# Patient Record
Sex: Female | Born: 1935 | Race: White | Hispanic: No | Marital: Single | Smoking: Never smoker
Health system: Southern US, Community
[De-identification: ages and names within clinical notes are randomized; demographics above are authoritative.]

## PROBLEM LIST (undated history)

## (undated) DIAGNOSIS — I4891 Unspecified atrial fibrillation: Secondary | ICD-10-CM

## (undated) DIAGNOSIS — R51 Headache: Secondary | ICD-10-CM

## (undated) DIAGNOSIS — IMO0001 Reserved for inherently not codable concepts without codable children: Secondary | ICD-10-CM

## (undated) DIAGNOSIS — F341 Dysthymic disorder: Secondary | ICD-10-CM

## (undated) DIAGNOSIS — R0789 Other chest pain: Secondary | ICD-10-CM

## (undated) DIAGNOSIS — R0989 Other specified symptoms and signs involving the circulatory and respiratory systems: Secondary | ICD-10-CM

## (undated) DIAGNOSIS — E559 Vitamin D deficiency, unspecified: Secondary | ICD-10-CM

## (undated) DIAGNOSIS — G459 Transient cerebral ischemic attack, unspecified: Secondary | ICD-10-CM

## (undated) DIAGNOSIS — T884XXA Failed or difficult intubation, initial encounter: Secondary | ICD-10-CM

## (undated) DIAGNOSIS — M1990 Unspecified osteoarthritis, unspecified site: Secondary | ICD-10-CM

## (undated) DIAGNOSIS — N6089 Other benign mammary dysplasias of unspecified breast: Secondary | ICD-10-CM

## (undated) DIAGNOSIS — I679 Cerebrovascular disease, unspecified: Secondary | ICD-10-CM

## (undated) DIAGNOSIS — N390 Urinary tract infection, site not specified: Secondary | ICD-10-CM

## (undated) DIAGNOSIS — K5730 Diverticulosis of large intestine without perforation or abscess without bleeding: Secondary | ICD-10-CM

## (undated) DIAGNOSIS — I6529 Occlusion and stenosis of unspecified carotid artery: Secondary | ICD-10-CM

## (undated) DIAGNOSIS — G4700 Insomnia, unspecified: Secondary | ICD-10-CM

## (undated) DIAGNOSIS — E785 Hyperlipidemia, unspecified: Secondary | ICD-10-CM

## (undated) DIAGNOSIS — E039 Hypothyroidism, unspecified: Secondary | ICD-10-CM

## (undated) DIAGNOSIS — F0390 Unspecified dementia without behavioral disturbance: Secondary | ICD-10-CM

## (undated) DIAGNOSIS — M797 Fibromyalgia: Secondary | ICD-10-CM

## (undated) DIAGNOSIS — F05 Delirium due to known physiological condition: Secondary | ICD-10-CM

## (undated) DIAGNOSIS — D126 Benign neoplasm of colon, unspecified: Secondary | ICD-10-CM

## (undated) DIAGNOSIS — I10 Essential (primary) hypertension: Secondary | ICD-10-CM

## (undated) DIAGNOSIS — E119 Type 2 diabetes mellitus without complications: Secondary | ICD-10-CM

## (undated) DIAGNOSIS — H9190 Unspecified hearing loss, unspecified ear: Secondary | ICD-10-CM

## (undated) DIAGNOSIS — N3010 Interstitial cystitis (chronic) without hematuria: Secondary | ICD-10-CM

## (undated) HISTORY — DX: Unspecified osteoarthritis, unspecified site: M19.90

## (undated) HISTORY — DX: Fibromyalgia: M79.7

## (undated) HISTORY — DX: Cerebrovascular disease, unspecified: I67.9

## (undated) HISTORY — DX: Hyperlipidemia, unspecified: E78.5

## (undated) HISTORY — DX: Benign neoplasm of colon, unspecified: D12.6

## (undated) HISTORY — DX: Other chest pain: R07.89

## (undated) HISTORY — DX: Interstitial cystitis (chronic) without hematuria: N30.10

## (undated) HISTORY — DX: Headache: R51

## (undated) HISTORY — DX: Essential (primary) hypertension: I10

## (undated) HISTORY — DX: Other benign mammary dysplasias of unspecified breast: N60.89

## (undated) HISTORY — DX: Failed or difficult intubation, initial encounter: T88.4XXA

## (undated) HISTORY — DX: Vitamin D deficiency, unspecified: E55.9

## (undated) HISTORY — DX: Unspecified atrial fibrillation: I48.91

## (undated) HISTORY — DX: Insomnia, unspecified: G47.00

## (undated) HISTORY — DX: Reserved for inherently not codable concepts without codable children: IMO0001

## (undated) HISTORY — DX: Hypothyroidism, unspecified: E03.9

## (undated) HISTORY — DX: Transient cerebral ischemic attack, unspecified: G45.9

## (undated) HISTORY — DX: Other specified symptoms and signs involving the circulatory and respiratory systems: R09.89

## (undated) HISTORY — DX: Urinary tract infection, site not specified: N39.0

## (undated) HISTORY — DX: Diverticulosis of large intestine without perforation or abscess without bleeding: K57.30

## (undated) HISTORY — DX: Dysthymic disorder: F34.1

## (undated) HISTORY — DX: Occlusion and stenosis of unspecified carotid artery: I65.29

## (undated) HISTORY — DX: Type 2 diabetes mellitus without complications: E11.9

## (undated) HISTORY — PX: COLONOSCOPY: SHX174

---

## 1986-02-17 HISTORY — PX: ABDOMINAL HYSTERECTOMY: SHX81

## 1988-02-18 HISTORY — PX: VESICOVAGINAL FISTULA CLOSURE W/ TAH: SUR271

## 1997-06-07 ENCOUNTER — Emergency Department (HOSPITAL_COMMUNITY): Admission: EM | Admit: 1997-06-07 | Discharge: 1997-06-07 | Payer: Self-pay | Admitting: Emergency Medicine

## 2000-04-09 ENCOUNTER — Encounter: Admission: RE | Admit: 2000-04-09 | Discharge: 2000-04-09 | Payer: Self-pay | Admitting: Internal Medicine

## 2003-02-18 HISTORY — PX: RIGHT COLECTOMY: SHX853

## 2003-07-27 ENCOUNTER — Encounter (INDEPENDENT_AMBULATORY_CARE_PROVIDER_SITE_OTHER): Payer: Self-pay | Admitting: Specialist

## 2003-07-27 ENCOUNTER — Inpatient Hospital Stay (HOSPITAL_COMMUNITY): Admission: RE | Admit: 2003-07-27 | Discharge: 2003-07-31 | Payer: Self-pay | Admitting: Surgery

## 2003-11-18 ENCOUNTER — Emergency Department (HOSPITAL_COMMUNITY): Admission: EM | Admit: 2003-11-18 | Discharge: 2003-11-18 | Payer: Self-pay | Admitting: Family Medicine

## 2003-12-22 ENCOUNTER — Ambulatory Visit: Payer: Self-pay | Admitting: Cardiovascular Disease

## 2003-12-25 ENCOUNTER — Ambulatory Visit: Payer: Self-pay | Admitting: Pulmonary Disease

## 2004-01-05 ENCOUNTER — Ambulatory Visit: Payer: Self-pay | Admitting: Cardiovascular Disease

## 2004-02-20 ENCOUNTER — Ambulatory Visit: Payer: Self-pay | Admitting: Pulmonary Disease

## 2004-03-22 ENCOUNTER — Ambulatory Visit: Payer: Self-pay

## 2004-03-27 ENCOUNTER — Ambulatory Visit: Payer: Self-pay | Admitting: Pulmonary Disease

## 2004-05-15 ENCOUNTER — Ambulatory Visit: Payer: Self-pay | Admitting: Pulmonary Disease

## 2004-05-21 ENCOUNTER — Ambulatory Visit: Payer: Self-pay | Admitting: Pulmonary Disease

## 2005-01-15 ENCOUNTER — Ambulatory Visit: Payer: Self-pay | Admitting: Pulmonary Disease

## 2005-03-17 ENCOUNTER — Ambulatory Visit: Payer: Self-pay | Admitting: Pulmonary Disease

## 2005-12-30 ENCOUNTER — Ambulatory Visit: Payer: Self-pay | Admitting: Pulmonary Disease

## 2006-02-06 ENCOUNTER — Ambulatory Visit: Payer: Self-pay | Admitting: Cardiovascular Disease

## 2006-02-12 ENCOUNTER — Ambulatory Visit: Payer: Self-pay

## 2006-02-12 ENCOUNTER — Ambulatory Visit: Payer: Self-pay | Admitting: Cardiovascular Disease

## 2006-02-13 ENCOUNTER — Ambulatory Visit: Payer: Self-pay | Admitting: Pulmonary Disease

## 2006-02-17 DIAGNOSIS — T884XXA Failed or difficult intubation, initial encounter: Secondary | ICD-10-CM

## 2006-02-17 HISTORY — DX: Failed or difficult intubation, initial encounter: T88.4XXA

## 2006-02-19 ENCOUNTER — Ambulatory Visit: Payer: Self-pay | Admitting: Cardiovascular Disease

## 2006-02-19 ENCOUNTER — Inpatient Hospital Stay (HOSPITAL_BASED_OUTPATIENT_CLINIC_OR_DEPARTMENT_OTHER): Admission: RE | Admit: 2006-02-19 | Discharge: 2006-02-19 | Payer: Self-pay | Admitting: Cardiovascular Disease

## 2006-02-19 HISTORY — PX: CARDIAC CATHETERIZATION: SHX172

## 2006-04-06 ENCOUNTER — Ambulatory Visit: Payer: Self-pay | Admitting: Cardiovascular Disease

## 2006-08-18 ENCOUNTER — Ambulatory Visit: Payer: Self-pay | Admitting: Pulmonary Disease

## 2006-08-18 ENCOUNTER — Ambulatory Visit: Payer: Self-pay | Admitting: Internal Medicine

## 2006-08-18 LAB — CONVERTED CEMR LAB
ALT: 23 units/L (ref 0–35)
AST: 23 units/L (ref 0–37)
Albumin: 4 g/dL (ref 3.5–5.2)
Alkaline Phosphatase: 61 units/L (ref 39–117)
Basophils Absolute: 0 10*3/uL (ref 0.0–0.1)
Bilirubin, Direct: 0.1 mg/dL (ref 0.0–0.3)
Chloride: 110 meq/L (ref 96–112)
GFR calc Af Amer: 107 mL/min
GFR calc non Af Amer: 88 mL/min
HCT: 34.2 % — ABNORMAL LOW (ref 36.0–46.0)
Hemoglobin: 11.8 g/dL — ABNORMAL LOW (ref 12.0–15.0)
Lymphocytes Relative: 30.4 % (ref 12.0–46.0)
MCV: 89.6 fL (ref 78.0–100.0)
Monocytes Absolute: 0.6 10*3/uL (ref 0.2–0.7)
Monocytes Relative: 9.8 % (ref 3.0–11.0)
Platelets: 220 10*3/uL (ref 150–400)
Potassium: 4 meq/L (ref 3.5–5.1)
RBC: 3.81 M/uL — ABNORMAL LOW (ref 3.87–5.11)
RDW: 14.1 % (ref 11.5–14.6)
Sodium: 142 meq/L (ref 135–145)
TSH: 22.09 microintl units/mL — ABNORMAL HIGH (ref 0.35–5.50)

## 2007-01-12 ENCOUNTER — Ambulatory Visit: Payer: Self-pay | Admitting: Pulmonary Disease

## 2007-01-12 LAB — CONVERTED CEMR LAB
Calcium: 9.5 mg/dL (ref 8.4–10.5)
Creatinine, Ser: 0.6 mg/dL (ref 0.4–1.2)
GFR calc Af Amer: 127 mL/min
Glucose, Bld: 242 mg/dL — ABNORMAL HIGH (ref 70–99)
Hgb A1c MFr Bld: 7.8 % — ABNORMAL HIGH (ref 4.6–6.0)
Sodium: 140 meq/L (ref 135–145)
TSH: 10.98 microintl units/mL — ABNORMAL HIGH (ref 0.35–5.50)

## 2007-02-02 ENCOUNTER — Ambulatory Visit: Payer: Self-pay | Admitting: Cardiovascular Disease

## 2007-02-02 ENCOUNTER — Ambulatory Visit: Payer: Self-pay

## 2007-04-13 ENCOUNTER — Ambulatory Visit: Payer: Self-pay | Admitting: Pulmonary Disease

## 2007-04-13 DIAGNOSIS — E1165 Type 2 diabetes mellitus with hyperglycemia: Secondary | ICD-10-CM | POA: Insufficient documentation

## 2007-04-13 DIAGNOSIS — E039 Hypothyroidism, unspecified: Secondary | ICD-10-CM | POA: Insufficient documentation

## 2007-04-13 DIAGNOSIS — M1990 Unspecified osteoarthritis, unspecified site: Secondary | ICD-10-CM | POA: Insufficient documentation

## 2007-04-13 DIAGNOSIS — E559 Vitamin D deficiency, unspecified: Secondary | ICD-10-CM | POA: Insufficient documentation

## 2007-04-13 DIAGNOSIS — E119 Type 2 diabetes mellitus without complications: Secondary | ICD-10-CM | POA: Insufficient documentation

## 2007-04-13 DIAGNOSIS — E785 Hyperlipidemia, unspecified: Secondary | ICD-10-CM | POA: Insufficient documentation

## 2007-04-19 LAB — CONVERTED CEMR LAB
BUN: 13 mg/dL (ref 6–23)
Calcium: 9.6 mg/dL (ref 8.4–10.5)
GFR calc non Af Amer: 130 mL/min
Glucose, Bld: 180 mg/dL — ABNORMAL HIGH (ref 70–99)
Hgb A1c MFr Bld: 7.8 % — ABNORMAL HIGH (ref 4.6–6.0)

## 2007-04-30 ENCOUNTER — Telehealth (INDEPENDENT_AMBULATORY_CARE_PROVIDER_SITE_OTHER): Payer: Self-pay | Admitting: *Deleted

## 2007-05-25 ENCOUNTER — Encounter: Payer: Self-pay | Admitting: Adult Health

## 2007-05-25 ENCOUNTER — Ambulatory Visit: Payer: Self-pay | Admitting: Pulmonary Disease

## 2007-05-31 LAB — CONVERTED CEMR LAB
Bacteria, UA: NEGATIVE
Crystals: NEGATIVE
Leukocytes, UA: NEGATIVE
Mucus, UA: NEGATIVE
Nitrite: NEGATIVE
RBC / HPF: NONE SEEN
Specific Gravity, Urine: 1.01 (ref 1.000–1.03)
Total Protein, Urine: NEGATIVE mg/dL
Urine Glucose: NEGATIVE mg/dL

## 2007-06-09 ENCOUNTER — Telehealth: Payer: Self-pay | Admitting: Pulmonary Disease

## 2007-08-31 ENCOUNTER — Ambulatory Visit: Payer: Self-pay | Admitting: Pulmonary Disease

## 2007-08-31 DIAGNOSIS — G4700 Insomnia, unspecified: Secondary | ICD-10-CM | POA: Insufficient documentation

## 2007-09-14 ENCOUNTER — Ambulatory Visit: Payer: Self-pay | Admitting: Internal Medicine

## 2007-09-22 ENCOUNTER — Encounter: Admission: RE | Admit: 2007-09-22 | Discharge: 2007-09-22 | Payer: Self-pay | Admitting: Pulmonary Disease

## 2007-10-07 ENCOUNTER — Telehealth: Payer: Self-pay | Admitting: Adult Health

## 2007-11-12 ENCOUNTER — Ambulatory Visit: Payer: Self-pay | Admitting: Pulmonary Disease

## 2007-11-12 ENCOUNTER — Encounter: Payer: Self-pay | Admitting: Adult Health

## 2007-11-19 ENCOUNTER — Telehealth (INDEPENDENT_AMBULATORY_CARE_PROVIDER_SITE_OTHER): Payer: Self-pay | Admitting: *Deleted

## 2007-11-24 ENCOUNTER — Encounter: Payer: Self-pay | Admitting: Pulmonary Disease

## 2007-12-27 ENCOUNTER — Telehealth: Payer: Self-pay | Admitting: Adult Health

## 2007-12-28 LAB — CONVERTED CEMR LAB
ALT: 32 units/L (ref 0–35)
AST: 28 units/L (ref 0–37)
Albumin: 4.2 g/dL (ref 3.5–5.2)
Alkaline Phosphatase: 56 units/L (ref 39–117)
Basophils Absolute: 0 10*3/uL (ref 0.0–0.1)
Bilirubin, Direct: 0.1 mg/dL (ref 0.0–0.3)
CO2: 25 meq/L (ref 19–32)
Direct LDL: 175.6 mg/dL
Eosinophils Relative: 0.8 % (ref 0.0–5.0)
GFR calc Af Amer: 91 mL/min
GFR calc non Af Amer: 75 mL/min
Hemoglobin: 12.2 g/dL (ref 12.0–15.0)
Lymphocytes Relative: 25.9 % (ref 12.0–46.0)
MCHC: 35.1 g/dL (ref 30.0–36.0)
MCV: 90 fL (ref 78.0–100.0)
Monocytes Absolute: 0.6 10*3/uL (ref 0.1–1.0)
Monocytes Relative: 9.3 % (ref 3.0–12.0)
Neutro Abs: 4 10*3/uL (ref 1.4–7.7)
RDW: 13.3 % (ref 11.5–14.6)
TSH: 0.63 microintl units/mL (ref 0.35–5.50)
Total Bilirubin: 1 mg/dL (ref 0.3–1.2)
Total Protein: 7.5 g/dL (ref 6.0–8.3)
Triglycerides: 203 mg/dL (ref 0–149)

## 2008-01-04 ENCOUNTER — Ambulatory Visit: Payer: Self-pay | Admitting: Cardiovascular Disease

## 2008-01-04 ENCOUNTER — Encounter: Payer: Self-pay | Admitting: Pulmonary Disease

## 2008-01-11 ENCOUNTER — Ambulatory Visit: Payer: Self-pay | Admitting: Pulmonary Disease

## 2008-01-13 DIAGNOSIS — I480 Paroxysmal atrial fibrillation: Secondary | ICD-10-CM | POA: Insufficient documentation

## 2008-01-13 DIAGNOSIS — M797 Fibromyalgia: Secondary | ICD-10-CM | POA: Insufficient documentation

## 2008-01-13 DIAGNOSIS — I4891 Unspecified atrial fibrillation: Secondary | ICD-10-CM | POA: Insufficient documentation

## 2008-01-13 DIAGNOSIS — D126 Benign neoplasm of colon, unspecified: Secondary | ICD-10-CM | POA: Insufficient documentation

## 2008-01-13 DIAGNOSIS — I10 Essential (primary) hypertension: Secondary | ICD-10-CM | POA: Insufficient documentation

## 2008-01-13 DIAGNOSIS — R0789 Other chest pain: Secondary | ICD-10-CM | POA: Insufficient documentation

## 2008-01-13 DIAGNOSIS — I679 Cerebrovascular disease, unspecified: Secondary | ICD-10-CM | POA: Insufficient documentation

## 2008-01-13 DIAGNOSIS — F341 Dysthymic disorder: Secondary | ICD-10-CM | POA: Insufficient documentation

## 2008-01-13 DIAGNOSIS — K5730 Diverticulosis of large intestine without perforation or abscess without bleeding: Secondary | ICD-10-CM | POA: Insufficient documentation

## 2008-01-17 ENCOUNTER — Ambulatory Visit: Payer: Self-pay | Admitting: Cardiovascular Disease

## 2008-02-14 ENCOUNTER — Telehealth (INDEPENDENT_AMBULATORY_CARE_PROVIDER_SITE_OTHER): Payer: Self-pay | Admitting: *Deleted

## 2008-02-25 ENCOUNTER — Telehealth (INDEPENDENT_AMBULATORY_CARE_PROVIDER_SITE_OTHER): Payer: Self-pay | Admitting: *Deleted

## 2008-03-02 ENCOUNTER — Telehealth: Payer: Self-pay | Admitting: Pulmonary Disease

## 2008-03-03 ENCOUNTER — Telehealth (INDEPENDENT_AMBULATORY_CARE_PROVIDER_SITE_OTHER): Payer: Self-pay | Admitting: *Deleted

## 2008-04-18 ENCOUNTER — Telehealth (INDEPENDENT_AMBULATORY_CARE_PROVIDER_SITE_OTHER): Payer: Self-pay | Admitting: Cardiology

## 2008-06-20 ENCOUNTER — Encounter: Payer: Self-pay | Admitting: Adult Health

## 2008-06-20 ENCOUNTER — Ambulatory Visit: Payer: Self-pay | Admitting: Pulmonary Disease

## 2008-06-21 LAB — CONVERTED CEMR LAB
ALT: 25 units/L (ref 0–35)
Basophils Absolute: 0 10*3/uL (ref 0.0–0.1)
Basophils Relative: 0.6 % (ref 0.0–3.0)
Bilirubin, Direct: 0.1 mg/dL (ref 0.0–0.3)
CO2: 28 meq/L (ref 19–32)
Calcium: 9.6 mg/dL (ref 8.4–10.5)
Creatinine, Ser: 0.7 mg/dL (ref 0.4–1.2)
Creatinine,U: 154.2 mg/dL
Eosinophils Absolute: 0.1 10*3/uL (ref 0.0–0.7)
GFR calc non Af Amer: 87.71 mL/min (ref >60)
Hemoglobin, Urine: NEGATIVE
Hgb A1c MFr Bld: 6.9 % — ABNORMAL HIGH (ref 4.6–6.5)
Lymphocytes Relative: 22.6 % (ref 12.0–46.0)
MCV: 88.6 fL (ref 78.0–100.0)
Microalb Creat Ratio: 6.5 mg/g (ref 0.0–30.0)
Monocytes Absolute: 0.6 10*3/uL (ref 0.1–1.0)
Monocytes Relative: 9.6 % (ref 3.0–12.0)
Neutro Abs: 4.3 10*3/uL (ref 1.4–7.7)
Neutrophils Relative %: 66.2 % (ref 43.0–77.0)
RBC: 4.11 M/uL (ref 3.87–5.11)
Specific Gravity, Urine: >=1.03 (ref 1.000–1.030)
TSH: 0.49 microintl units/mL (ref 0.35–5.50)
Total Bilirubin: 1 mg/dL (ref 0.3–1.2)
Total CHOL/HDL Ratio: 6
Vitamin B-12: 267 pg/mL (ref 211–911)
WBC: 6.4 10*3/uL (ref 4.5–10.5)
pH: 5 (ref 5.0–8.0)

## 2008-06-22 LAB — CONVERTED CEMR LAB: Vit D, 25-Hydroxy: 29 ng/mL — ABNORMAL LOW (ref 30–89)

## 2008-07-07 ENCOUNTER — Telehealth (INDEPENDENT_AMBULATORY_CARE_PROVIDER_SITE_OTHER): Payer: Self-pay | Admitting: *Deleted

## 2008-07-12 ENCOUNTER — Telehealth: Payer: Self-pay | Admitting: Cardiovascular Disease

## 2008-07-14 ENCOUNTER — Ambulatory Visit: Payer: Self-pay | Admitting: Pulmonary Disease

## 2008-07-14 DIAGNOSIS — R51 Headache: Secondary | ICD-10-CM | POA: Insufficient documentation

## 2008-07-14 DIAGNOSIS — R519 Headache, unspecified: Secondary | ICD-10-CM | POA: Insufficient documentation

## 2008-07-14 DIAGNOSIS — N390 Urinary tract infection, site not specified: Secondary | ICD-10-CM | POA: Insufficient documentation

## 2008-07-16 LAB — CONVERTED CEMR LAB
Hemoglobin, Urine: NEGATIVE
Total Protein, Urine: NEGATIVE mg/dL
Urine Glucose: NEGATIVE mg/dL
pH: 5 (ref 5.0–8.0)

## 2008-08-17 ENCOUNTER — Encounter: Payer: Self-pay | Admitting: Pulmonary Disease

## 2008-09-21 ENCOUNTER — Telehealth (INDEPENDENT_AMBULATORY_CARE_PROVIDER_SITE_OTHER): Payer: Self-pay | Admitting: *Deleted

## 2008-10-16 ENCOUNTER — Encounter: Payer: Self-pay | Admitting: Pulmonary Disease

## 2009-01-04 ENCOUNTER — Ambulatory Visit: Payer: Self-pay | Admitting: Cardiovascular Disease

## 2009-01-04 ENCOUNTER — Ambulatory Visit: Payer: Self-pay

## 2009-01-19 ENCOUNTER — Encounter: Admission: RE | Admit: 2009-01-19 | Discharge: 2009-01-19 | Payer: Self-pay | Admitting: Surgery

## 2009-02-15 ENCOUNTER — Ambulatory Visit: Payer: Self-pay | Admitting: Pulmonary Disease

## 2009-02-15 ENCOUNTER — Encounter: Payer: Self-pay | Admitting: Adult Health

## 2009-02-15 LAB — CONVERTED CEMR LAB
ALT: 35 units/L (ref 0–35)
AST: 29 units/L (ref 0–37)
Alkaline Phosphatase: 64 units/L (ref 39–117)
Basophils Absolute: 0.1 10*3/uL (ref 0.0–0.1)
Basophils Relative: 0.9 % (ref 0.0–3.0)
Bilirubin Urine: NEGATIVE
Bilirubin, Direct: 0.1 mg/dL (ref 0.0–0.3)
CO2: 27 meq/L (ref 19–32)
Cholesterol: 229 mg/dL — ABNORMAL HIGH (ref 0–200)
Direct LDL: 161.3 mg/dL
GFR calc non Af Amer: 87.54 mL/min (ref >60)
Glucose, Bld: 178 mg/dL — ABNORMAL HIGH (ref 70–99)
Hemoglobin: 12.8 g/dL (ref 12.0–15.0)
Ketones, ur: NEGATIVE mg/dL
Lymphs Abs: 1.8 10*3/uL (ref 0.7–4.0)
Monocytes Absolute: 0.6 10*3/uL (ref 0.1–1.0)
Neutro Abs: 5.3 10*3/uL (ref 1.4–7.7)
Nitrite: POSITIVE
Platelets: 220 10*3/uL (ref 150.0–400.0)
Potassium: 4.5 meq/L (ref 3.5–5.1)
Total Bilirubin: 0.8 mg/dL (ref 0.3–1.2)
Urine Glucose: NEGATIVE mg/dL
Urobilinogen, UA: 0.2 (ref 0.0–1.0)
WBC: 7.9 10*3/uL (ref 4.5–10.5)
pH: 5 (ref 5.0–8.0)

## 2009-03-02 ENCOUNTER — Telehealth (INDEPENDENT_AMBULATORY_CARE_PROVIDER_SITE_OTHER): Payer: Self-pay | Admitting: *Deleted

## 2009-04-06 ENCOUNTER — Telehealth: Payer: Self-pay | Admitting: Pulmonary Disease

## 2009-04-19 ENCOUNTER — Ambulatory Visit: Payer: Self-pay | Admitting: Pulmonary Disease

## 2009-04-19 ENCOUNTER — Encounter: Payer: Self-pay | Admitting: Adult Health

## 2009-04-19 LAB — CONVERTED CEMR LAB
Bilirubin Urine: NEGATIVE
Nitrite: POSITIVE
Urine Glucose: NEGATIVE mg/dL
Urobilinogen, UA: 0.2 (ref 0.0–1.0)

## 2009-04-26 ENCOUNTER — Telehealth: Payer: Self-pay | Admitting: Adult Health

## 2009-04-27 ENCOUNTER — Telehealth: Payer: Self-pay | Admitting: Pulmonary Disease

## 2009-04-27 ENCOUNTER — Ambulatory Visit: Payer: Self-pay | Admitting: Pulmonary Disease

## 2009-04-27 ENCOUNTER — Encounter: Payer: Self-pay | Admitting: Adult Health

## 2009-04-27 LAB — CONVERTED CEMR LAB: Urine Glucose: NEGATIVE mg/dL

## 2009-05-08 ENCOUNTER — Encounter: Payer: Self-pay | Admitting: Pulmonary Disease

## 2009-09-12 ENCOUNTER — Ambulatory Visit: Payer: Self-pay | Admitting: Pulmonary Disease

## 2009-09-12 ENCOUNTER — Encounter: Payer: Self-pay | Admitting: Adult Health

## 2009-09-13 ENCOUNTER — Telehealth: Payer: Self-pay | Admitting: Adult Health

## 2009-09-13 LAB — CONVERTED CEMR LAB
BUN: 14 mg/dL (ref 6–23)
Basophils Relative: 0.3 % (ref 0.0–3.0)
Bilirubin, Direct: 0.1 mg/dL (ref 0.0–0.3)
CO2: 28 meq/L (ref 19–32)
Chloride: 100 meq/L (ref 96–112)
Direct LDL: 152.7 mg/dL
Eosinophils Absolute: 0.1 10*3/uL (ref 0.0–0.7)
Eosinophils Relative: 2.6 % (ref 0.0–5.0)
HCT: 34 % — ABNORMAL LOW (ref 36.0–46.0)
Lymphs Abs: 1.3 10*3/uL (ref 0.7–4.0)
Monocytes Absolute: 0.6 10*3/uL (ref 0.1–1.0)
Monocytes Relative: 11.1 % (ref 3.0–12.0)
Neutro Abs: 3.6 10*3/uL (ref 1.4–7.7)
Neutrophils Relative %: 63.3 % (ref 43.0–77.0)
Nitrite: POSITIVE
Potassium: 4.5 meq/L (ref 3.5–5.1)
Sodium: 135 meq/L (ref 135–145)
Specific Gravity, Urine: >=1.03 (ref 1.000–1.030)
Total CHOL/HDL Ratio: 5
Total Protein, Urine: NEGATIVE mg/dL
Total Protein: 7.1 g/dL (ref 6.0–8.3)
Triglycerides: 211 mg/dL — ABNORMAL HIGH (ref 0.0–149.0)
Urine Glucose: NEGATIVE mg/dL
VLDL: 42.2 mg/dL — ABNORMAL HIGH (ref 0.0–40.0)
WBC: 5.7 10*3/uL (ref 4.5–10.5)
pH: 5.5 (ref 5.0–8.0)

## 2009-09-14 ENCOUNTER — Telehealth (INDEPENDENT_AMBULATORY_CARE_PROVIDER_SITE_OTHER): Payer: Self-pay | Admitting: *Deleted

## 2009-09-19 ENCOUNTER — Telehealth (INDEPENDENT_AMBULATORY_CARE_PROVIDER_SITE_OTHER): Payer: Self-pay | Admitting: *Deleted

## 2009-11-06 ENCOUNTER — Encounter: Payer: Self-pay | Admitting: Pulmonary Disease

## 2009-12-04 ENCOUNTER — Telehealth: Payer: Self-pay | Admitting: Cardiovascular Disease

## 2010-01-22 ENCOUNTER — Ambulatory Visit: Payer: Self-pay | Admitting: Cardiovascular Disease

## 2010-01-22 ENCOUNTER — Encounter: Payer: Self-pay | Admitting: Cardiovascular Disease

## 2010-01-22 ENCOUNTER — Ambulatory Visit: Payer: Self-pay

## 2010-02-15 ENCOUNTER — Ambulatory Visit
Admission: RE | Admit: 2010-02-15 | Discharge: 2010-02-15 | Payer: Self-pay | Source: Home / Self Care | Attending: Pulmonary Disease | Admitting: Pulmonary Disease

## 2010-02-15 ENCOUNTER — Ambulatory Visit: Payer: Self-pay | Admitting: Pulmonary Disease

## 2010-02-19 LAB — CONVERTED CEMR LAB
ALT: 40 units/L — ABNORMAL HIGH (ref 0–35)
Alkaline Phosphatase: 69 units/L (ref 39–117)
BUN: 17 mg/dL (ref 6–23)
Bilirubin, Direct: 0.1 mg/dL (ref 0.0–0.3)
Chloride: 100 meq/L (ref 96–112)
Cholesterol: 244 mg/dL — ABNORMAL HIGH (ref 0–200)
Creatinine, Ser: 0.7 mg/dL (ref 0.4–1.2)
Eosinophils Absolute: 0.1 10*3/uL (ref 0.0–0.7)
Eosinophils Relative: 1.3 % (ref 0.0–5.0)
Glucose, Bld: 162 mg/dL — ABNORMAL HIGH (ref 70–99)
Hemoglobin: 13 g/dL (ref 12.0–15.0)
Lymphocytes Relative: 22.2 % (ref 12.0–46.0)
Lymphs Abs: 1.9 10*3/uL (ref 0.7–4.0)
MCV: 90.4 fL (ref 78.0–100.0)
Monocytes Absolute: 0.6 10*3/uL (ref 0.1–1.0)
Monocytes Relative: 7.4 % (ref 3.0–12.0)
Neutrophils Relative %: 69 % (ref 43.0–77.0)
Sodium: 136 meq/L (ref 135–145)
Total Bilirubin: 0.8 mg/dL (ref 0.3–1.2)
Total CHOL/HDL Ratio: 6
Triglycerides: 278 mg/dL — ABNORMAL HIGH (ref 0.0–149.0)

## 2010-03-12 ENCOUNTER — Telehealth (INDEPENDENT_AMBULATORY_CARE_PROVIDER_SITE_OTHER): Payer: Self-pay | Admitting: *Deleted

## 2010-03-19 NOTE — Letter (Signed)
Summary: [facility]  [facility]   Imported By: Lennie Odor 11/14/2009 12:23:35  _____________________________________________________________________  External Attachment:    Type:   Image     Comment:   External Document

## 2010-03-19 NOTE — Assessment & Plan Note (Signed)
Summary: yearly/cy   Primary Provider:  Alroy Dust, MD  CC:  burning sensation in chest.  History of Present Illness: Jaclyn [name] is seen today fo rf/U of HTN, elevated lipids previous atypical SSCP and right carotid bruit.  She's had a normal cath in 2008.  She has known RICA dx.  I reviewed her duplex from today and she has stable 60-79% RICA stenosis (157/44), and 40-59% LICA stenosis.  She has had no recurrent SSCP, no TIA like symptoms.  She has been compliant with her meds.  She is intolerant to statins.  She needs to have her cholesterol checked at Dr. Kirstie Mirza.  Othewise she is doing well   Current Problems (verified): 1)  Uti  (ICD-599.0) 2)  Headache  (ICD-784.0) 3)  Hypertension  (ICD-401.9) 4)  Chest Pain, Atypical  (ICD-786.59) 5)  Paroxysmal Atrial Fibrillation  (ICD-427.31) 6)  Cerebrovascular Disease  (ICD-437.9) 7)  Hyperlipidemia  (ICD-272.4) 8)  Diabetes Mellitus  (ICD-250.00) 9)  Hypothyroidism  (ICD-244.9) 10)  Diverticulosis of Colon  (ICD-562.10) 11)  Colonic Polyps  (ICD-211.3) 12)  Sebaceous Cyst, Breast  (ICD-610.8) 13)  Degenerative Joint Disease  (ICD-715.90) 14)  Fibromyalgia  (ICD-729.1) 15)  Vitamin D Deficiency  (ICD-268.9) 16)  Anxiety Depression  (ICD-300.4) 17)  Insomnia  (ICD-780.52)  Current Medications (verified): 1)  Mavik 2 Mg Tabs (Trandolapril) .... Take 1 Tablet By Mouth Once A Day 2)  Glyburide-Metformin 5-500 Mg Tabs (Glyburide-Metformin) .... 1/2  By Mouth Two Times A Day 3)  Januvia 100 Mg Tabs (Sitagliptin Phosphate) .Marland Kitchen.. 1 By Mouth Once Daily 4)  Levothyroxine Sodium 100 Mcg Tabs (Levothyroxine Sodium) .... Take 1 Tablet By Mouth Once A Day 5)  Daypro 600 Mg  Tabs (Oxaprozin) .Marland Kitchen.. 1 Once Daily As Needed Joint Pain With Food 6)  Lexapro 20 Mg Tabs (Escitalopram Oxalate) .... Take 1 Tablet By Mouth Once A Day 7)  Vitamin D 1000 Unit Tabs (Cholecalciferol) .... Take 2 By Mouth Once Daily 8)  Vitamin B-12 100 Mcg Tabs (Cyanocobalamin) ....  Take 1 Tab By Mouth Once Daily.Marland KitchenMarland Kitchen 9)  Relion Micro W/device Kit (Blood Glucose Monitoring Suppl) .... Monitor Blood Sugars Dx 250.00 10)  Relion Micro Test  Strp (Glucose Blood) .... Check Blood Sugars Once Daily Dx 250.00 11)  Super B Complex  Tabs (B Complex-C) .... Take 1 Tablet By Mouth Once A Day 12)  Magnesium  (Magnesium) .Marland Kitchen.. 1  Tab By Mouth Once Daily  Allergies (verified): 1)  ! Sulfa 2)  ! * Statin Drugs...  Past History:  Past Medical History: Last updated: 02/15/2009 HYPERTENSION (ICD-401.9) - on MAVIK 2mg /d...    CHEST PAIN, ATYPICAL (ICD-786.59) - she had cath for atyp CP 1/08 showing no signif CAD, EF= 60%... prev CWP part of her FM complex...  PAROXYSMAL ATRIAL FIBRILLATION (ICD-427.31) - followed by Walker Kehr & last seen 11/09... she is holding NSR without palpit or arrhythmias noted...  ~  2DEcho 5/05 was normal- no valve dis, normal wall thickness, norm EF...  CEREBROVASCULAR DISEASE (ICD-437.9) - on ASA 81mg /d (but admits to taking it intermittently)... she has a right carotid bruit and bilat carotid stenoses... no TIA symptoms...  ~  CDopplers 11/09 showed mod plaque in the prox ICAs bilat R>L, 60-79%R & 40-59%L, stable- no change going back to 2005 studies... f/u 1 yr.  ~  5/10:  reminded to take her ASA daily...  HYPERLIPIDEMIA (ICD-272.4) - on diet alone... she has refused Statin Rx- "It makes my bones hurt so bad"... [facility] but states she didn't tol Crestor5  Rx... then tried FISH OIL & titrated up to 3/d buty her f/u FLP was no better...    ~  FLP 9/09 showed TChol 230, TG 203, HDL 38, LDL 176... rec- [facility]  ~  FLP 5/10 on FishOil showed TChol 231, TG 145, HDL 41, LDL 169  DIABETES MELLITUS (ICD-250.00) - now on GLUCOVANCE 2.5-500 one tab daily (she ignored rec to increase to Bid), and JANUVIA 100mg /d...   ~  discussed w/ pt 11/09: incr the Glucovance 5/500 Bid & Januvia 100mg /d...  ~  labs 5/10 still on one of each (wt down 7#  to 148#) showed BS= 167, A1c= 6.9  HYPOTHYROIDISM (ICD-244.9) - now on SYNTHROID 139mcg/d...   ~  labs 11/08 showed TSH= 10.98... ?if taking med regularly?  ~  labs 9/09 showed TSH= 0.63... rec- continue same dose.  ~  labs 5/10 showed TSH= 0.49  DIVERTICULOSIS OF COLON (ICD-562.10) & COLONIC POLYPS (ICD-211.3) - followed by DrWeissman... last colonoscopy 3/05 w divertics and cecal villous adenoma- referred to drStreck w/ right colectomy 6/05 (no cancer in specimen).     Past Surgical History: Last updated: 02/15/2009 PMH CONTINUED --------------------------------------------------------   SEBACEOUS CYST, BREAST (ICD-610.8) - infected seb cyst left chest wall treated in 2009 w/ Keflex & resolved. ---Mammogram- 01/2009 neg   DEGENERATIVE JOINT DISEASE (ICD-715.90) - Eval and Rx by DrRamos in 2007... calcif tendonitis in shoulders, DDD in CSpine... given Naprosyn & phys therapy... currently taking DAYPRO 600mg /d Prn.  FIBROMYALGIA (ICD-729.1) - on Daypro as noted... not interested in Lyrica/ Savella/ Rheum consult...  VITAMIN D DEFICIENCY (ICD-268.9) - she has had a very low Vit D level = 9 on 7/08 & 9/09. rx vit d 50 k, then vit d 2000 once daily w/ calc /d two times a day   SURGICAL HX -----------------------------------------     S/P hysterectomy w/ cystocele & rectocele repairs 1990 by DrMcPhail S/P right colectomy for villous adenoma of the cecum 6/05 by DrStreck  Family History: Last updated: 02/22/2008 father died at 61 with a lymphoma (also had h/o hypertension, MI and elevated cholesterol) Mother died after age 73 (h/o hypertension)  1 brother with allergies 3 sisters with hypertension, elevated choleterol and fibromyalgia.  Social History: Last updated: Feb 22, 2008 divorced. previously worked at [employer]. had 3 children (1 had Down's syndrome and died at 69 months of age). Consumes alcohol periodically. nonsmoker.  Review of Systems       Denies fever, malais,  weight loss, blurry vision, decreased visual acuity, cough, sputum, SOB, hemoptysis, pleuritic pain, palpitaitons, heartburn, abdominal pain, melena, lower extremity edema, claudication, or rash.   Vital Signs:  Patient profile:   75 year old female Height:      63 inches Weight:      148 pounds BMI:     26.31 Pulse rate:   79 / minute Resp:     14 per minute BP sitting:   132 / 70  (left arm)  Vitals Entered By: Kem Parkinson (January 22, 2010 3:16 PM)  Physical Exam  General:  Affect appropriate Healthy:  appears stated age HEENT: normal Neck supple with no adenopathy JVP normal right  bruits no thyromegaly Lungs clear with no wheezing and good diaphragmatic motion Heart:  S1/S2 no murmur,rub, gallop or click PMI normal Abdomen: benighn, BS positve, no tenderness, no AAA no bruit.  No HSM or HJR Distal pulses intact with no bruits No edema Neuro non-focal Skin  warm and dry    Impression & Recommendations:  Problem # 1:  HYPERTENSION (ICD-401.9) Well controlled Her updated medication list for this problem includes:    Mavik 2 Mg Tabs (Trandolapril) .Marland Kitchen... Take 1 tablet by mouth once a day  Problem # 2:  CEREBROVASCULAR DISEASE (ICD-437.9) Stable 60-79% RICA F/U duplex in 6 months  Problem # 3:  CHEST PAIN, ATYPICAL (ICD-786.59) Atypical with multiple previous normal myovue and no disease on previous cath Her updated medication list for this problem includes:    Mavik 2 Mg Tabs (Trandolapril) .Marland Kitchen... Take 1 tablet by mouth once a day  Problem # 4:  HYPERLIPIDEMIA (ICD-272.4) Diet Rx intolerant to stanins CHOL: 211 (09/12/2009)   LDL: DEL (11/12/2007)   HDL: 40.40 (09/12/2009)   TG: 211.0 (09/12/2009)  Patient Instructions: 1)  Your physician wants you to follow-up in: 6 MONTHS  You will receive a reminder letter in the mail two months in advance. If you don't receive a letter, please call our office to schedule the follow-up appointment. 2)  Your physician has  requested that you have a carotid duplex. This test is an ultrasound of the carotid arteries in your neck. It looks at blood flow through these arteries that supply the brain with blood. Allow one hour for this exam. There are no restrictions or special instructions.

## 2010-03-19 NOTE — Assessment & Plan Note (Signed)
Summary: bladder infection/ mbw   Visit Type:  Sick visit Primary Provider/Referring Provider:  Alroy Dust, MD  CC:  Pt c/o urine deep yellow in color, cloudy, intermittent vaginal pain, and slight urgency/frequency, lower back pain and left side pain. Pt denies, fowl odor, and abdominal pain. Pt states she has a history or UTI's and always has to have two rounds of abx to get relief. Pt also c/o just getting over "the flu".  History of Present Illness: Patient is a 75 year old, white female patient of Dr. Jodelle Green who has a known history of hypertension, hyperlipidemia, diabetes mellitus and hypothyroidism.   7/14 /09--Presented 3 days fo painful red cyst along lateral left breast/medial axilla area.  Felt to have infected sebaceous cyst, rx keflex for 7 days.   09/14/07--Returns for follow up. Redness and swelling resolved along cyst on breast but not totally resolved. Mammogram/US neg, felt to be infected sebaceous cyst.   November 12, 2007--returns for follow up. doing well. Denies chest pain, dyspnea, orthopnea, hemoptysis, fever, n/v/d, edema. Here for fasting labs. went downstairs already. Does admit she ate a little something this morning so she would not get low sugar.   Jun 20, 2008--Pt presents for 3 month follow up and labs. 3 mth rov - would like to discuss few issues 1. wants vitamin b 12 checked. Has chronic lower legs / foot pain, burns intermittently.  2. intermittent anxiety, trouble sleeping, Lexapro helps but "need just a little something else"  3. Assures me she is taking meds regularly, no hypoglycemic episodes.  4. Has taken her vitamin d  , level was 9 (9/09) Denies chest pain, dyspnea, orthopnea, hemoptysis, fever, n/v/d, edema, headache,recent travel or antibiotics  February 15, 2009--Returns for routine follow up and labs.  - Would like fasting blood done today - pt is fasting. She has been doing well except she admits she has been overeating and eating lots of  sweets.  She is taking her meds , has missed few doses of meds. Denies chest pain, dyspnea, orthopnea, hemoptysis, fever, n/v/d, edema, headache. April 19, 2009 --Presents for an acute office visit. Complains of Pt c/o urine deep yellow in color, cloudy, intermittent vaginal pain, and slight urgency/frequency, lower back pain and left side pain. Pt denies, fowl odor, abdominal pain. Pt states she has a history or UTI's and always has to have two rounds of abx to get relief. Pt also c/o just getting over "the flu". complains of urinary urgency and frequency w/ low abd pressure w/ urination. Denies chest pain, dyspnea, orthopnea, hemoptysis, fever, n/v/d, edema, headache. last UTI several months ago. (kleb.pneu-pan senstive).   Current Medications (verified): 1)  Mavik 2 Mg Tabs (Trandolapril) .... Take 1 Tablet By Mouth Once A Day 2)  Glyburide-Metformin 2.5-500 Mg  Tabs (Glyburide-Metformin) .... Take One Tablet Once Daily Alternating With One Tablet Two Times A Day The Next Day 3)  Januvia 100 Mg Tabs (Sitagliptin Phosphate) .Marland Kitchen.. 1 By Mouth Once Daily 4)  Levothyroxine Sodium 100 Mcg Tabs (Levothyroxine Sodium) .... Take 1 Tablet By Mouth Once A Day 5)  Daypro 600 Mg  Tabs (Oxaprozin) .Marland Kitchen.. 1 Once Daily As Needed Joint Pain With Food 6)  Lexapro 20 Mg Tabs (Escitalopram Oxalate) .... Take 1 Tablet By Mouth Once A Day 7)  Vitamin D 1000 Unit Tabs (Cholecalciferol) .... Take 2 By Mouth Once Daily 8)  Vitamin B-12 100 Mcg Tabs (Cyanocobalamin) .... Take 1 Tab By Mouth Once Daily.Marland KitchenMarland Kitchen 9)  Relion Micro W/device  Kit (Blood Glucose Monitoring Suppl) .... Monitor Blood Sugars Dx 250.00 10)  Relion Micro Test  Strp (Glucose Blood) .... Check Blood Sugars Once Daily Dx 250.00 11)  Super B Complex  Tabs (B Complex-C) .... Take 1 Tablet By Mouth Once A Day 12)  Vitamin C 500 Mg Tabs (Ascorbic Acid) .... Take 1 Tablet By Mouth Once A Day  Allergies (verified): 1)  ! Sulfa 2)  ! * Statin Drugs...  Past  History:  Past Medical History: Last updated: 02/15/2009 HYPERTENSION (ICD-401.9) - on MAVIK 2mg /d...    CHEST PAIN, ATYPICAL (ICD-786.59) - she had cath for atyp CP 1/08 showing no signif CAD, EF= 60%... prev CWP part of her FM complex...  PAROXYSMAL ATRIAL FIBRILLATION (ICD-427.31) - followed by Walker Kehr & last seen 11/09... she is holding NSR without palpit or arrhythmias noted...  ~  2DEcho 5/05 was normal- no valve dis, normal wall thickness, norm EF...  CEREBROVASCULAR DISEASE (ICD-437.9) - on ASA 81mg /d (but admits to taking it intermittently)... she has a right carotid bruit and bilat carotid stenoses... no TIA symptoms...  ~  CDopplers 11/09 showed mod plaque in the prox ICAs bilat R>L, 60-79%R & 40-59%L, stable- no change going back to 2005 studies... f/u 1 yr.  ~  5/10:  reminded to take her ASA daily...  HYPERLIPIDEMIA (ICD-272.4) - on diet alone... she has refused Statin Rx- "It makes my bones hurt so bad"... [facility] but states she didn't tol Crestor5  Rx... then tried FISH OIL & titrated up to 3/d buty her f/u FLP was no better...    ~  FLP 9/09 showed TChol 230, TG 203, HDL 38, LDL 176... rec- refer to [facility]  ~  FLP 5/10 on FishOil showed TChol 231, TG 145, HDL 41, LDL 169  DIABETES MELLITUS (ICD-250.00) - now on GLUCOVANCE 2.5-500 one tab daily (she ignored rec to increase to Bid), and JANUVIA 100mg /d...   ~  discussed w/ pt 11/09: incr the Glucovance 5/500 Bid & Januvia 100mg /d...  ~  labs 5/10 still on one of each (wt down 7# to 148#) showed BS= 167, A1c= 6.9  HYPOTHYROIDISM (ICD-244.9) - now on SYNTHROID 113mcg/d...   ~  labs 11/08 showed TSH= 10.98... ?if taking med regularly?  ~  labs 9/09 showed TSH= 0.63... rec- continue same dose.  ~  labs 5/10 showed TSH= 0.49  DIVERTICULOSIS OF COLON (ICD-562.10) & COLONIC POLYPS (ICD-211.3) - followed by DrWeissman... last colonoscopy 3/05 w divertics and cecal villous adenoma- referred to drStreck w/  right colectomy 6/05 (no cancer in specimen).     Past Surgical History: Last updated: 02/15/2009 PMH CONTINUED --------------------------------------------------------   SEBACEOUS CYST, BREAST (ICD-610.8) - infected seb cyst left chest wall treated in 2009 w/ Keflex & resolved. ---Mammogram- 01/2009 neg   DEGENERATIVE JOINT DISEASE (ICD-715.90) - Eval and Rx by DrRamos in 2007... calcif tendonitis in shoulders, DDD in CSpine... given Naprosyn & phys therapy... currently taking DAYPRO 600mg /d Prn.  FIBROMYALGIA (ICD-729.1) - on Daypro as noted... not interested in Lyrica/ Savella/ Rheum consult...  VITAMIN D DEFICIENCY (ICD-268.9) - she has had a very low Vit D level = 9 on 7/08 & 9/09. rx vit d 50 k, then vit d 2000 once daily w/ calc /d two times a day   SURGICAL HX -----------------------------------------     S/P hysterectomy w/ cystocele & rectocele repairs 1990 by DrMcPhail S/P right colectomy for villous adenoma of the cecum 6/05 by DrStreck  Family History: Last updated: 02/13/2008  father died at 71 with a lymphoma (also had h/o hypertension, MI and elevated cholesterol) Mother died after age 73 (h/o hypertension)  1 brother with allergies 3 sisters with hypertension, elevated choleterol and fibromyalgia.  Social History: Last updated: 02/13/2008 divorced. previously worked at [employer]. had 3 children (1 had Down's syndrome and died at 71 months of age). Consumes alcohol periodically. nonsmoker.  Risk Factors: Smoking Status: never (01/11/2008)  Review of Systems      See HPI  Vital Signs:  Patient profile:   75 year old female Height:      63 inches Weight:      152 pounds O2 Sat:      96 % on Room air Temp:     98.2 degrees F oral Pulse rate:   68 / minute BP sitting:   132 / 70  (left arm) Cuff size:   regular  Vitals Entered By: Zackery Barefoot CMA (April 19, 2009 11:35 AM)  O2 Flow:  Room air CC: Pt c/o urine deep yellow in color, cloudy,  intermittent vaginal pain, and slight urgency/frequency, lower back pain and left side pain. Pt denies, fowl odor, abdominal pain. Pt states she has a history or UTI's and always has to have two rounds of abx to get relief. Pt also c/o just getting over "the flu" Comments Medications reviewed with patient Verified contact number and pharmacy with patient Zackery Barefoot CMA  April 19, 2009 11:39 AM    Physical Exam  Additional Exam:  WD, WN, 75  y/o WF in NAD... GENERAL:  Alert & oriented; pleasant & cooperative... HEENT:  [place]/AT, EOM-wnl, PERRLA, EACs-clear, TMs-wnl, NOSE-clear, THROAT-clear & wnl. NECK:  Supple w/ fairROM; no JVD; normal carotid impulses w/o bruits; no thyromegaly or nodules palpated; no lymphadenopathy. CHEST:  Clear to P & A; without wheezes/ rales/ or rhonchi. HEART:  Regular Rhythm; without murmurs/ rubs/ or gallops. ABDOMEN:  Soft & nontender; normal bowel sounds; no organomegaly or masses detected, no guarding or rebound, neg CVA tenderness  EXT: without deformities, mild arthritic changes; no varicose veins/ venous insuffic/ or edema. NEURO:  CN's intact; motor testing normal; sensory testing normal; gait normal & balance OK.  DERM:  No lesions noted; no rash [email or web address]     Impression & Recommendations:  Problem # 1:  UTI (ICD-599.0) Cipro 250mg  two times a day for 7 days  Increase fluids.  Empty bladder frequently  Urinary hygiene measures.  Please contact office for sooner follow up if symptoms do not improve or worsen  The following medications were removed from the medication list:    Zithromax Z-pak 250 Mg Tabs (Azithromycin) .Marland Kitchen... As directed    Cipro 250 Mg Tabs (Ciprofloxacin hcl) .Marland Kitchen... 1 by mouth two times a day Her updated medication list for this problem includes:    Cipro 250 Mg Tabs (Ciprofloxacin hcl) .Marland Kitchen... 1 by mouth two times a day  Orders: TLB-Udip w/ Micro (81001-URINE) T-Urine Culture (Spectrum Order) [id number]) Est. Patient  Level III (91478)  Medications Added to Medication List This Visit: 1)  Vitamin C 500 Mg Tabs (Ascorbic acid) .... Take 1 tablet by mouth once a day 2)  Cipro 250 Mg Tabs (Ciprofloxacin hcl) .Marland Kitchen.. 1 by mouth two times a day  Complete Medication List: 1)  Mavik 2 Mg Tabs (Trandolapril) .... Take 1 tablet by mouth once a day 2)  Glyburide-metformin 2.5-500 Mg Tabs (Glyburide-metformin) .... Take one tablet once daily alternating with one tablet two times a day the next  day 3)  Januvia 100 Mg Tabs (Sitagliptin phosphate) .Marland Kitchen.. 1 by mouth once daily 4)  Levothyroxine Sodium 100 Mcg Tabs (Levothyroxine sodium) .... Take 1 tablet by mouth once a day 5)  Daypro 600 Mg Tabs (Oxaprozin) .Marland Kitchen.. 1 once daily as needed joint pain with food 6)  Lexapro 20 Mg Tabs (Escitalopram oxalate) .... Take 1 tablet by mouth once a day 7)  Vitamin D 1000 Unit Tabs (Cholecalciferol) .... Take 2 by mouth once daily 8)  Vitamin B-12 100 Mcg Tabs (Cyanocobalamin) .... Take 1 tab by mouth once daily.Marland KitchenMarland Kitchen 9)  Relion Micro W/device Kit (Blood glucose monitoring suppl) .... Monitor blood sugars dx 250.00 10)  Relion Micro Test Strp (Glucose blood) .... Check blood sugars once daily dx 250.00 11)  Super B Complex Tabs (B complex-c) .... Take 1 tablet by mouth once a day 12)  Vitamin C 500 Mg Tabs (Ascorbic acid) .... Take 1 tablet by mouth once a day 13)  Cipro 250 Mg Tabs (Ciprofloxacin hcl) .Marland Kitchen.. 1 by mouth two times a day  Patient Instructions: 1)  Cipro 250mg  two times a day for 7 days  2)  Increase fluids.  3)  Empty bladder frequently  4)  Urinary hygiene measures.  5)  Please contact office for sooner follow up if symptoms do not improve or worsen  Prescriptions: CIPRO 250 MG TABS (CIPROFLOXACIN HCL) 1 by mouth two times a day  #14 x 0   Entered and Authorized by:   [name] NP   Signed by:   Tammy Parrett NP on 04/19/2009   Method used:   Electronically to        [facility] (retail)        [address]       [place], [place]  [address]       Ph: [phone] or [phone]       Fax: [phone]   RxID:   [id number]

## 2010-03-19 NOTE — Assessment & Plan Note (Signed)
Summary: Acute NP office visit - fasting labs   Primary Provider/Referring Provider:  Alroy Dust, MD  CC:  pt would like lipid and a1c checked - pt is fasting.  also of UTI symptoms w/ painful urination, increased frequency, and urine dark in color x3-4days.  History of Present Illness: Patient is a 75 year old, white female patient of Dr. Jodelle Green who has a known history of hypertension, hyperlipidemia, diabetes mellitus and hypothyroidism.   7/14 /09--Presented 3 days fo painful red cyst along lateral left breast/medial axilla area.  Felt to have infected sebaceous cyst, rx keflex for 7 days.   09/14/07--Returns for follow up. Redness and swelling resolved along cyst on breast but not totally resolved. Mammogram/US neg, felt to be infected sebaceous cyst.   November 12, 2007--returns for follow up. doing well. Denies chest pain, dyspnea, orthopnea, hemoptysis, fever, n/v/d, edema. Here for fasting labs. went downstairs already. Does admit she ate a little something this morning so she would not get low sugar.   Jun 20, 2008--Pt presents for 3 month follow up and labs. 3 mth rov - would like to discuss few issues 1. wants vitamin b 12 checked. Has chronic lower legs / foot pain, burns intermittently.  2. intermittent anxiety, trouble sleeping, Lexapro helps but "need just a little something else"  3. Assures me she is taking meds regularly, no hypoglycemic episodes.  4. Has taken her vitamin d  , level was 9 (9/09) Denies chest pain, dyspnea, orthopnea, hemoptysis, fever, n/v/d, edema, headache,recent travel or antibiotics  February 15, 2009--Returns for routine follow up and labs.  - Would like fasting blood done today - pt is fasting. She has been doing well except she admits she has been overeating and eating lots of sweets.  She is taking her meds , has missed few doses of meds. Denies chest pain, dyspnea, orthopnea, hemoptysis, fever, n/v/d, edema, headache. April 19, 2009 --Presents  for an acute office visit. Complains of Pt c/o urine deep yellow in color, cloudy, intermittent vaginal pain, and slight urgency/frequency, lower back pain and left side pain. Pt denies, fowl odor, abdominal pain. Pt states she has a history or UTI's and always has to have two rounds of abx to get relief. Pt also c/o just getting over "the flu". complains of urinary urgency and frequency w/ low abd pressure w/ urination. Denies chest pain, dyspnea, orthopnea, hemoptysis, fever, n/v/d, edema, headache. last UTI several months ago. (kleb.pneu-pan senstive).   September 12, 2009--Presents for work in visit for urinary symptoms. Also she is fasting and would like labs done. Complains of  painful urination, increased frequency, urine dark in color x3-4days. Has hx of recurrent UTI w/ previous work up at urology. She has no back pain, fever or n/v/d. Denies chest pain, dyspnea, orthopnea, hemoptysis, fever, n/v/d, edema, headache, polydipsia. She says her sugars have been trending high at home. Cheats on her diet frequently-loves sweets.   Medications Prior to Update: 1)  Mavik 2 Mg Tabs (Trandolapril) .... Take 1 Tablet By Mouth Once A Day 2)  Glyburide-Metformin 2.5-500 Mg  Tabs (Glyburide-Metformin) .... Take One Tablet Once Daily Alternating With One Tablet Two Times A Day The Next Day 3)  Januvia 100 Mg Tabs (Sitagliptin Phosphate) .Marland Kitchen.. 1 By Mouth Once Daily 4)  Levothyroxine Sodium 100 Mcg Tabs (Levothyroxine Sodium) .... Take 1 Tablet By Mouth Once A Day 5)  Daypro 600 Mg  Tabs (Oxaprozin) .Marland Kitchen.. 1 Once Daily As Needed Joint Pain With Food 6)  Lexapro  20 Mg Tabs (Escitalopram Oxalate) .... Take 1 Tablet By Mouth Once A Day 7)  Vitamin D 1000 Unit Tabs (Cholecalciferol) .... Take 2 By Mouth Once Daily 8)  Vitamin B-12 100 Mcg Tabs (Cyanocobalamin) .... Take 1 Tab By Mouth Once Daily.Marland KitchenMarland Kitchen 9)  Relion Micro W/device Kit (Blood Glucose Monitoring Suppl) .... Monitor Blood Sugars Dx 250.00 10)  Relion Micro Test   Strp (Glucose Blood) .... Check Blood Sugars Once Daily Dx 250.00 11)  Super B Complex  Tabs (B Complex-C) .... Take 1 Tablet By Mouth Once A Day 12)  Vitamin C 500 Mg Tabs (Ascorbic Acid) .... Take 1 Tablet By Mouth Once A Day 13)  Cipro 500 Mg Tabs (Ciprofloxacin Hcl) .... Take 1 Tablet By Mouth Two Times A Day  Current Medications (verified): 1)  Mavik 2 Mg Tabs (Trandolapril) .... Take 1 Tablet By Mouth Once A Day 2)  Glyburide-Metformin 2.5-500 Mg  Tabs (Glyburide-Metformin) .... Take One Tablet Once Daily Alternating With One Tablet Two Times A Day The Next Day 3)  Januvia 100 Mg Tabs (Sitagliptin Phosphate) .Marland Kitchen.. 1 By Mouth Once Daily 4)  Levothyroxine Sodium 100 Mcg Tabs (Levothyroxine Sodium) .... Take 1 Tablet By Mouth Once A Day 5)  Daypro 600 Mg  Tabs (Oxaprozin) .Marland Kitchen.. 1 Once Daily As Needed Joint Pain With Food 6)  Lexapro 20 Mg Tabs (Escitalopram Oxalate) .... Take 1 Tablet By Mouth Once A Day 7)  Vitamin D 1000 Unit Tabs (Cholecalciferol) .... Take 2 By Mouth Once Daily 8)  Vitamin B-12 100 Mcg Tabs (Cyanocobalamin) .... Take 1 Tab By Mouth Once Daily.Marland KitchenMarland Kitchen 9)  Relion Micro W/device Kit (Blood Glucose Monitoring Suppl) .... Monitor Blood Sugars Dx 250.00 10)  Relion Micro Test  Strp (Glucose Blood) .... Check Blood Sugars Once Daily Dx 250.00 11)  Super B Complex  Tabs (B Complex-C) .... Take 1 Tablet By Mouth Once A Day 12)  Vitamin C 500 Mg Tabs (Ascorbic Acid) .... Take 1 Tablet By Mouth Once A Day  Allergies (verified): 1)  ! Sulfa 2)  ! * Statin Drugs...  Past History:  Past Medical History: Last updated: 02/15/2009 HYPERTENSION (ICD-401.9) - on MAVIK 2mg /d...    CHEST PAIN, ATYPICAL (ICD-786.59) - she had cath for atyp CP 1/08 showing no signif CAD, EF= 60%... prev CWP part of her FM complex...  PAROXYSMAL ATRIAL FIBRILLATION (ICD-427.31) - followed by Walker Kehr & last seen 11/09... she is holding NSR without palpit or arrhythmias noted...  ~  2DEcho 5/05 was normal- no  valve dis, normal wall thickness, norm EF...  CEREBROVASCULAR DISEASE (ICD-437.9) - on ASA 81mg /d (but admits to taking it intermittently)... she has a right carotid bruit and bilat carotid stenoses... no TIA symptoms...  ~  CDopplers 11/09 showed mod plaque in the prox ICAs bilat R>L, 60-79%R & 40-59%L, stable- no change going back to 2005 studies... f/u 1 yr.  ~  5/10:  reminded to take her ASA daily...  HYPERLIPIDEMIA (ICD-272.4) - on diet alone... she has refused Statin Rx- "It makes my bones hurt so bad"... [facility] but states she didn't tol Crestor5  Rx... then tried FISH OIL & titrated up to 3/d buty her f/u FLP was no better...    ~  FLP 9/09 showed TChol 230, TG 203, HDL 38, LDL 176... rec- refer to [facility]  ~  FLP 5/10 on FishOil showed TChol 231, TG 145, HDL 41, LDL 169  DIABETES MELLITUS (ICD-250.00) - now on GLUCOVANCE 2.5-500 one  tab daily (she ignored rec to increase to Bid), and JANUVIA 100mg /d...   ~  discussed w/ pt 11/09: incr the Glucovance 5/500 Bid & Januvia 100mg /d...  ~  labs 5/10 still on one of each (wt down 7# to 148#) showed BS= 167, A1c= 6.9  HYPOTHYROIDISM (ICD-244.9) - now on SYNTHROID 181mcg/d...   ~  labs 11/08 showed TSH= 10.98... ?if taking med regularly?  ~  labs 9/09 showed TSH= 0.63... rec- continue same dose.  ~  labs 5/10 showed TSH= 0.49  DIVERTICULOSIS OF COLON (ICD-562.10) & COLONIC POLYPS (ICD-211.3) - followed by DrWeissman... last colonoscopy 3/05 w divertics and cecal villous adenoma- referred to drStreck w/ right colectomy 6/05 (no cancer in specimen).     Past Surgical History: Last updated: 02/15/2009 PMH CONTINUED --------------------------------------------------------   SEBACEOUS CYST, BREAST (ICD-610.8) - infected seb cyst left chest wall treated in 2009 w/ Keflex & resolved. ---Mammogram- 01/2009 neg   DEGENERATIVE JOINT DISEASE (ICD-715.90) - Eval and Rx by DrRamos in 2007... calcif tendonitis in shoulders, DDD  in [facility]... given Naprosyn & phys therapy... currently taking DAYPRO 600mg /d Prn.  FIBROMYALGIA (ICD-729.1) - on Daypro as noted... not interested in Lyrica/ Savella/ Rheum consult...  VITAMIN D DEFICIENCY (ICD-268.9) - she has had a very low Vit D level = 9 on 7/08 & 9/09. rx vit d 50 k, then vit d 2000 once daily w/ calc /d two times a day   SURGICAL HX -----------------------------------------     S/P hysterectomy w/ cystocele & rectocele repairs 1990 by DrMcPhail S/P right colectomy for villous adenoma of the cecum 6/05 by DrStreck  Family History: Last updated: March 01, 2008 father died at 67 with a lymphoma (also had h/o hypertension, MI and elevated cholesterol) Mother died after age 86 (h/o hypertension)  1 brother with allergies 3 sisters with hypertension, elevated choleterol and fibromyalgia.  Review of Systems      See HPI  Vital Signs:  Patient profile:   75 year old female Height:      63 inches Weight:      148.13 pounds BMI:     26.33 O2 Sat:      96 % on Room air Temp:     97.2 degrees F oral Pulse rate:   84 / minute BP sitting:   124 / 80  (right arm) Cuff size:   regular  Vitals Entered By: Boone Master CNA/MA (September 12, 2009 10:57 AM)  O2 Flow:  Room air CC: pt would like lipid and a1c checked - pt is fasting.  also of UTI symptoms w/ painful urination, increased frequency, urine dark in color x3-4days Is Patient Diabetic? Yes Comments Medications reviewed with patient Daytime contact number verified with patient. Boone Master CNA/MA  September 12, 2009 10:59 AM    Physical Exam  Additional Exam:  WD, WN, 75  y/o WF in NAD... GENERAL:  Alert & oriented; pleasant & cooperative... HEENT:  [place]/AT, EOM-wnl, PERRLA, EACs-clear, TMs-wnl, NOSE-clear, THROAT-clear & wnl. NECK:  Supple w/ fairROM; no JVD; normal carotid impulses w/o bruits; no thyromegaly or nodules palpated; no lymphadenopathy. CHEST:  Clear to P & A; without wheezes/ rales/ or  rhonchi. HEART:  Regular Rhythm; without murmurs/ rubs/ or gallops. ABDOMEN:  Soft & nontender; normal bowel sounds; no organomegaly or masses detected, no guarding or rebound, neg CVA tenderness  EXT: without deformities, mild arthritic changes; no varicose veins/ venous insuffic/ or edema. NEURO:  CN's intact; motor testing normal; sensory testing normal; gait normal & balance OK.  DERM:  No lesions noted; no rash [place].es     Impression & Recommendations:  Problem # 1:  HYPERTENSION (ICD-401.9)  controlled on r x Her updated medication list for this problem includes:    Mavik 2 Mg Tabs (Trandolapril) .Marland Kitchen... Take 1 tablet by mouth once a day  Orders: TLB-BMP (Basic Metabolic Panel-BMET) (80048-METABOL) TLB-CBC Platelet - w/Differential (85025-CBCD) TLB-TSH (Thyroid Stimulating Hormone) (84443-TSH) Est. Patient Level IV (87564)  BP today: 124/80 Prior BP: 132/70 (04/19/2009)  Labs Reviewed: K+: 4.5 (02/15/2009) Creat: : 0.7 (02/15/2009)   Chol: 229 (02/15/2009)   HDL: 42.30 (02/15/2009)   LDL: DEL (11/12/2007)   TG: 201.0 (02/15/2009)  Problem # 2:  DIABETES MELLITUS (ICD-250.00) will adjust meds when labs return discussed diet and exercise.  Her updated medication list for this problem includes:    Glyburide-metformin 2.5-500 Mg Tabs (Glyburide-metformin) .Marland Kitchen... Take one tablet once daily alternating with one tablet two times a day the next day    Januvia 100 Mg Tabs (Sitagliptin phosphate) .Marland Kitchen... 1 by mouth once daily  Orders: TLB-A1C / Hgb A1C (Glycohemoglobin) (83036-A1C) Est. Patient Level IV (33295)  Labs Reviewed: Creat: 0.7 (02/15/2009)    Reviewed HgBA1c results: 7.9 (02/15/2009)  6.9 (06/20/2008)  Problem # 3:  HYPOTHYROIDISM (ICD-244.9)  repeat tsh Her updated medication list for this problem includes:    Levothyroxine Sodium 100 Mcg Tabs (Levothyroxine sodium) .Marland Kitchen... Take 1 tablet by mouth once a day  Labs Reviewed: TSH: 6.13 (02/15/2009)    HgBA1c:  7.9 (02/15/2009) Chol: 229 (02/15/2009)   HDL: 42.30 (02/15/2009)   LDL: DEL (11/12/2007)   TG: 201.0 (02/15/2009)  Orders: Est. Patient Level IV (18841)  Problem # 4:  UTI (ICD-599.0) check ua and cx The following medications were removed from the medication list:    Cipro 500 Mg Tabs (Ciprofloxacin hcl) .Marland Kitchen... Take 1 tablet by mouth two times a day  Orders: T-Urine Culture (Spectrum Order) [id number]) TLB-Udip w/ Micro (81001-URINE) Est. Patient Level IV (09323)  Complete Medication List: 1)  Mavik 2 Mg Tabs (Trandolapril) .... Take 1 tablet by mouth once a day 2)  Glyburide-metformin 2.5-500 Mg Tabs (Glyburide-metformin) .... Take one tablet once daily alternating with one tablet two times a day the next day 3)  Januvia 100 Mg Tabs (Sitagliptin phosphate) .Marland Kitchen.. 1 by mouth once daily 4)  Levothyroxine Sodium 100 Mcg Tabs (Levothyroxine sodium) .... Take 1 tablet by mouth once a day 5)  Daypro 600 Mg Tabs (Oxaprozin) .Marland Kitchen.. 1 once daily as needed joint pain with food 6)  Lexapro 20 Mg Tabs (Escitalopram oxalate) .... Take 1 tablet by mouth once a day 7)  Vitamin D 1000 Unit Tabs (Cholecalciferol) .... Take 2 by mouth once daily 8)  Vitamin B-12 100 Mcg Tabs (Cyanocobalamin) .... Take 1 tab by mouth once daily.Marland KitchenMarland Kitchen 9)  Relion Micro W/device Kit (Blood glucose monitoring suppl) .... Monitor blood sugars dx 250.00 10)  Relion Micro Test Strp (Glucose blood) .... Check blood sugars once daily dx 250.00 11)  Super B Complex Tabs (B complex-c) .... Take 1 tablet by mouth once a day 12)  Vitamin C 500 Mg Tabs (Ascorbic acid) .... Take 1 tablet by mouth once a day  Other Orders: TLB-Hepatic/Liver Function Pnl (80076-HEPATIC) TLB-Lipid Panel (80061-LIPID)  Patient Instructions: 1)  Continue on same meds.  2)  Continue on low sweet diet, low cholestrol diet.  3)  I will call with labs results.  4)  Please contact office for sooner follow up if symptoms do not  improve or worsen  5)  follow  up Dr. Kriste Basque in 3 months.    Immunization History:  Influenza Immunization History:    Influenza:  historical (12/18/2008)

## 2010-03-19 NOTE — Progress Notes (Signed)
Summary: rx  Phone Note Call from Patient Call back at Home Phone [phone]   Caller: Patient Call For: Khole Branch Reason for Call: Acute Illness, Talk to Nurse Summary of Call: flu - sore, aches, since Tuesday, cough, nose running, not getting thing up, chills.  Wants to know if you'll call in Tamiflu? [facility] Initial call taken by: Eugene Gavia,  April 06, 2009 11:30 AM  Follow-up for Phone Call        Pt also c/o nausea, and being tired. Pt is unsure if she has a fever but has been having chills. Pt does not want to come in to be seen because she feels she is contagious. Please advise. Zackery Barefoot CMA  April 06, 2009 11:37 AM   Additional Follow-up for Phone Call Additional follow up Details #1::        per SN---need to get her symptoms----if URI  give zpak and use mucinex----can only have tamiflu is fever greater than 101,  aching all over (worse than usual) and cough.  Randell Loop [facility]  April 06, 2009 2:50 PM     Additional Follow-up for Phone Call Additional follow up Details #2::    lmomtcb Randell Loop CMA  April 06, 2009 3:30 PM   Pt c/o nasal and chest congestion, dry cough,headache, runny nose, nausea,  chills, pt unsure if she had a fever or not.  Per Sn recs if symptoms are a URI call in zpak and use mucinex. I advised pt of SN recs and rx has been sent. Carron Curie CMA  April 09, 2009 10:01 AM   New/Updated Medications: ZITHROMAX Z-PAK 250 MG TABS (AZITHROMYCIN) as directed Prescriptions: ZITHROMAX Z-PAK 250 MG TABS (AZITHROMYCIN) as directed  #1 x 0   Entered by:   Carron Curie CMA   Authorized by:   Michele Mcalpine MD   Signed by:   Carron Curie CMA on 04/09/2009   Method used:   Electronically to        [facility] (retail)       [address]       [place], [place]  [address]       Ph: [phone] or [phone]       Fax: [phone]   RxID:   [id number]

## 2010-03-19 NOTE — Letter (Signed)
Summary: [facility]   Imported By: Sherian Rein 05/23/2009 07:49:37  _____________________________________________________________________  External Attachment:    Type:   Image     Comment:   External Document

## 2010-03-19 NOTE — Progress Notes (Signed)
Summary: results  Phone Note Call from Patient Call back at Home Phone [phone]   Caller: Patient Call For: tammy p Reason for Call: Talk to Nurse Summary of Call: pt looking for results of Urine - wants to know if she has infection and if she needs something for it. Initial call taken by: Eugene Gavia,  September 14, 2009 4:36 PM  Follow-up for Phone Call        Spoke with pt.  She is requesting results of urine test done.  Pls advise thanks! Vernie Murders  September 14, 2009 4:46 PM   Additional Follow-up for Phone Call Additional follow up Details #1::        they are not back yet, no infection showed the first growth cx so we need to wait to see if any infection is really there.  I will look at over weekend. --  Additional Follow-up by: [name] NP,  September 14, 2009 4:50 PM    Additional Follow-up for Phone Call Additional follow up Details #2::    Called, spoke with pt.  Pt informed of above statement per TP. She verbalized understanding. Gweneth Dimitri RN  September 14, 2009 4:56 PM

## 2010-03-19 NOTE — Progress Notes (Signed)
Summary: results/ refills  Phone Note Call from Patient Call back at Home Phone [phone]   Caller: Patient Call For: Nyan Dufresne Summary of Call: pt wants results of labs from yesterday. also [facility] [address] [facility] not [facility] any [facility]ill orders for her meds yet. pt says she asked about these during ov yesterday.  Initial call taken by: Tivis Ringer, CNA,  September 13, 2009 4:45 PM  Follow-up for Phone Call        Called, spoke with pt.  She is requesting rxs for levothyroxine, lexapro, mavik, and [place].  [facility] [address]--pt aware.   Pt also requesting results from lab work and urine done yesterday.  Results unsigned in EMR -- will forward message to Evangeline Utley, pls advise.  Thanks, Follow-up by: Gweneth Dimitri RN,  September 13, 2009 4:57 PM  Additional Follow-up for Phone Call Additional follow up Details #1::        thanks for sending refills see lab append --she is aware of new change. Additional Follow-up by: Thedore Pickel NP,  September 13, 2009 5:18 PM    Prescriptions: LEXAPRO 20 MG TABS (ESCITALOPRAM OXALATE) Take 1 tablet by mouth once a day  #30 Tablet x 5   Entered by:   Gweneth Dimitri RN   Authorized by:   Michele Mcalpine MD   Signed by:   Gweneth Dimitri RN on 09/13/2009   Method used:   Electronically to        [facility] [id number] (retail)       [address]       [place], [place]  [address]       Ph: [phone] or [phone]       Fax: [phone]   RxID:   [id number] LEVOTHYROXINE SODIUM 100 MCG TABS (LEVOTHYROXINE SODIUM) Take 1 tablet by mouth once a day  #30 x 5   Entered by:   Gweneth Dimitri RN   Authorized by:   Michele Mcalpine MD   Signed by:   Gweneth Dimitri RN on 09/13/2009   Method used:   Electronically to        [facility] [id number] (retail)       [address]       [place], [place]  [address]       Ph: [phone] or [phone]       Fax: [phone]   RxID:   [id number] JANUVIA 100 MG TABS  (SITAGLIPTIN PHOSPHATE) 1 by mouth once daily  #30 Tablet x 5   Entered by:   Gweneth Dimitri RN   Authorized by:   Michele Mcalpine MD   Signed by:   Gweneth Dimitri RN on 09/13/2009   Method used:   Electronically to        [facility] [id number] (retail)       [address]       [place], [place]  [address]       Ph: [phone] or [phone]       Fax: [phone]   RxID:   [id number] MAVIK 2 MG TABS (TRANDOLAPRIL) Take 1 tablet by mouth once a day  #30 x 5   Entered by:   Gweneth Dimitri RN   Authorized by:   Michele Mcalpine MD   Signed by:   Gweneth Dimitri RN on 09/13/2009   Method used:   Electronically to        [facility] [phone]* (retail)  [name]       [place], [place]  [address]       Ph: [phone] or [phone]       Fax: [phone]   RxID:   [id number]

## 2010-03-19 NOTE — Progress Notes (Signed)
Summary: urine/ labs  Phone Note Call from Patient Call back at Home Phone [phone]   Caller: Patient Call For: Jaclyn [name] Summary of Call: pt states that she will not come in today (because of the rain). will do her urine labs tomorrow. she wants to make sure that a rx can be called in even though tp won't be in tomorrow.  Initial call taken by: Tivis Ringer, CNA,  April 26, 2009 4:09 PM  Follow-up for Phone Call        Advised pt ok top coem in tomorrow. I also notified Denyce Robert CMA  April 26, 2009 4:25 PM

## 2010-03-19 NOTE — Progress Notes (Signed)
Summary: ? UTI- UA results pending  Phone Note Call from Patient Call back at Home Phone [phone]   Caller: Patient Call For: nadel Reason for Call: Talk to Nurse Summary of Call: says she has UTI, left 2nd specimen today, TP told her she was going to give her some more antibiotics to take care of this.  Can you check this out? Initial call taken by: Eugene Gavia,  April 27, 2009 2:40 PM  Follow-up for Phone Call        Please advise results of urine, thanks allergic to sulfa  per SN give Cipro 500 one by mouth two times a day #14 0refills. Carron Curie CMA  April 27, 2009 3:41 PM  rx sent. pt aware. Carron Curie CMA  April 27, 2009 3:43 PM     New/Updated Medications: CIPRO 500 MG TABS (CIPROFLOXACIN HCL) Take 1 tablet by mouth two times a day Prescriptions: CIPRO 500 MG TABS (CIPROFLOXACIN HCL) Take 1 tablet by mouth two times a day  #14 x 0   Entered by:   Carron Curie CMA   Authorized by:   Michele Mcalpine MD   Signed by:   Carron Curie CMA on 04/27/2009   Method used:   Electronically to        [facility] (retail)       [address]       [place], [place]  [address]       Ph: [phone] or [phone]       Fax: [phone]   RxID:   [id number]

## 2010-03-19 NOTE — Progress Notes (Signed)
Summary: prescript  Phone Note Call from Patient   Caller: Patient Call For: nadel Summary of Call: need refill for [facility] 2mg  [facility] [address] Initial call taken by: Rickard Patience,  September 19, 2009 3:30 PM  Follow-up for Phone Call        Called and spoke with pharmacist who advised they did not receive a refill request that was BMN so they did fill the [facility] as generic. I called pt to notify same and LMOMTCB x 1. Zackery Barefoot CMA  September 19, 2009 4:15 PM   Additional Follow-up for Phone Call Additional follow up Details #1::        Pt reports that generic form of Mavik causes depression and suicidal thoughts.  Sp[facility] [address] and they instrcuted to bring back rx if it hadn't been opened and they would try to resend it through as brand name mavik. Pt informed of instructions. Abigail Miyamoto RN  September 20, 2009 10:39 AM

## 2010-03-19 NOTE — Progress Notes (Signed)
Summary: set up cartoid doppler on same day  Phone Note Call from Patient Call back at Home Phone [phone]   Caller: Patient Reason for Call: Talk to Nurse Details for Reason: Pt would like to be set up for cartoid doppler on same day when she see dr. Eden Emms. Initial call taken by: Lorne Skeens,  December 04, 2009 4:45 PM  Follow-up for Phone Call        order placed as pt is due for carotid doppler 11/11.  Will hace PCC to call and schedule appt with pt.  Follow-up by: Charolotte Capuchin, RN,  December 04, 2009 4:58 PM

## 2010-03-19 NOTE — Progress Notes (Signed)
Summary: urinary tract inf  Phone Note Call from Patient   Caller: Patient Call For: parrett Summary of Call: would like ciprofloxacin hcl 250mg  for urinary tract infection [facility] Initial call taken by: Rickard Patience,  March 02, 2009 4:47 PM  Follow-up for Phone Call        called, spoke with pt.  Pt states she is still having burning after and when urinating, urinating more frequently, and having lower back pain.  Was given cipro 250 two times a day x7days on 02/15/09 by TP for UTI.  states she finished this aprox 2-3 days.  Requesting one more round.  Will forward to SN-please advise.  Thanks! Follow-up by: Gweneth Dimitri RN,  March 02, 2009 4:56 PM  Additional Follow-up for Phone Call Additional follow up Details #1::        per SN----try azo and cranberry juice over the weekend---if not  better on monday call to come in for udip and culture.  thanks Randell Loop CMA  March 02, 2009 5:19 PM     Additional Follow-up for Phone Call Additional follow up Details #2::    called, spoke with pt.  Pt informed of above statement and recs per SN.  She verbalized understanding. Follow-up by: Gweneth Dimitri RN,  March 02, 2009 5:21 PM

## 2010-03-21 NOTE — Assessment & Plan Note (Signed)
Summary: 3 MONTHS F/U ///KP   Primary Care Provider:  Alroy Dust, MD  CC:  19 month ROV & review of mult medical problems....  History of Present Illness: 75 y/o WF here for a follow up visit... she has multiple medical problems as noted below- followed for HBP, PAF, Carotid disease, Hyperchol, DM, Hypothy, FM, Vit D defic, anxiety/ depression... she is also followed by Walker Kehr for Cardiology, and DrWeissman for GI...   ~  Jul 14, 2008:  here for 6 month ROV... had labs done 06/20/08- see below... UTI w/ c/s +klebsiella sens Cipro- she wants repeat culture... now c/o headache w/ classic muscle contraction/ mixed features- occip area over top and around head; ?ppt events (stress); lasts all day; ?alleviating factors (Daypro helped); no assoc N/V etc...      ** we discussed Rx w/ Midrin + her current meds... further eval if symptoms worsen despite Rx.   ~  February 15, 2010:  19 mo ROV- doing fair> she's had repeated bladder infections & saw DrOttelin 9/11- Cipro w/o help but she notes that Doxy worked well... BP controlled on Mavik but she notes that the Generic "depresses me" but she doesn't want to change meds- just wants the non-generic rx...  she had f/u Cards 12/11- DrNishan, no signif CAD, atypCP, f/u CDopplers w/ mod plaque & bilat ICAstenoses- stable...  she has been trying to control Lipids w/ diet alone but unsuccessful & she wants Zetia trial... BS poorly controlled but she is not on diet & needs to incr meds (see below)... she notes heartburn but states that daily "mustard" really helps...  we discussed diet + exercise & need for more regular f/u visits.    Current Problems:   HYPERTENSION (ICD-401.9) - on MAVIK 2mg /d (she requests the non-generic Mavik because the generic "depresses me")...  BP today= 118/74 & feeling well, takes med regularly and tolerates well... denies visual changes, CP, palipit, dizziness, syncope, dyspnea, edema, etc...   CHEST PAIN, ATYPICAL (ICD-786.59) - she  had cath for atyp CP 1/08 showing no signif CAD, EF= 60%... prev CWP part of her FM complex... followed by Walker Kehr for Cards.  PAROXYSMAL ATRIAL FIBRILLATION (ICD-427.31) - followed by Walker Kehr & his notes are reviewed... she is holding NSR without palpit or arrhythmias noted...  ~  2DEcho 5/05 was normal- no valve dis, normal wall thickness, norm EF...  CEREBROVASCULAR DISEASE (ICD-437.9) - on ASA 81mg /d (but admits to taking it intermittently)... she has a right carotid bruit and bilat carotid stenoses... no TIA symptoms...  ~  CDopplers 11/09 showed mod plaque in the prox ICAs bilat R>L, 60-79%R & 40-59%L, stable- no change going back to 2005 studies.  ~  5/10:  reminded to take her ASA daily.  ~  f/u CDopplers 12/11 showed mod plaque, stable bilat carotid dis w/ 60-79% RICA & 40-59% LICA stenoses.  HYPERLIPIDEMIA (ICD-272.4) - on diet alone... she has refused Statin Rx- "It makes my bones hurt so bad"... went [facility] but states she didn't tol Crestor5 "I hurt so bad- it's diff than my FM" & discomfort resolved off Rx... then tried FISH OIL & titrated up to 3/d but her f/u FLP was no better... she is encouraged to f/u w/ Lipid Clinci.  ~  FLP 12/07 showed TChol 192, TG 172, HDL 38, LDL 120... on diet alone... pt refuses Statin Rx.  ~  FLP 9/09 showed TChol 230, TG 203, HDL 38, LDL 176... rec- refer to [facility]inic  ~  FLP  5/10 on FishOil showed TChol 231, TG 145, HDL 41, LDL 169  ~  FLP 12/11 showed TChol 244, TG 278, HDL 42, LDL 176... she wants to try ZETIA 10mg /d.  DIABETES MELLITUS (ICD-250.00) - now on GLUCOVANCE 5-500 1/2 tab Bid and JANUVIA 100mg /d...  ~  labs 2/09 showed BS= 180, HgA1c= 7.8.Marland KitchenMarland Kitchen on Glucov 2.5-500 taking 1 daily.  ~  labs 9/09 showed BS= 219, HgA1c= 7.4... she ignored advise to incr to Bid.  ~  discussed w/ pt 11/09: incr to Glucovance 5/500 Bid & Januvia 100mg /d...  ~  labs 5/10 still on one of each (wt down 7# to 148#) showed BS= 167, A1c= 6.9  ~  labs  12/11 showed BS= 162, A1c= 8.6.Marland KitchenMarland Kitchen rec to incr glucov5-500 Bid + Januv100/d.  HYPOTHYROIDISM (ICD-244.9) - now on SYNTHROID 161mcg/d...   ~  labs 11/08 showed TSH= 10.98... ?if taking med regularly?  ~  labs 9/09 showed TSH= 0.63... rec- continue same dose.  ~  labs 5/10 showed TSH= 0.49  ~  labs 12/11 showed TSH= 3.21  DIVERTICULOSIS OF COLON (ICD-562.10) & COLONIC POLYPS (ICD-211.3) - followed by DrWeissman... last colonoscopy 3/05 w divertics and cecal villous adenoma- referred to DrStreck w/ right colectomy 6/05 (no cancer in specimen).  SEBACEOUS CYST, BREAST (ICD-610.8) - infected seb cyst left chest wall treated in 2009 w/ Keflex & resolved.  DEGENERATIVE JOINT DISEASE (ICD-715.90) - Eval and Rx by DrRamos in 2007... calcif tendonitis in shoulders, DDD in CSpine... given Naprosyn & phys therapy... currently taking DAYPRO 600mg /d Prn...  FIBROMYALGIA (ICD-729.1) - on Daypro as noted... not interested in Lyrica/ Savella/ Rheum consult...  VITAMIN D DEFICIENCY (ICD-268.9) - she has had a very low Vit D level = 9 on 7/08 & 9/09... she was perscribed Vit D 50,000 u weekly but apparently never took this medication... we wrote it for her again w/ instructions to take it weekly until we tell her to stop, but she only took it for 4 weeks then stopped on her own in favor of 1000 u daily OTC supplement... f/u Vit D level 5/10= 29, therefore continue the Vit D 1000/d.  ~  BMD 7/08 was WNL-  TScore in Spine= +0.6,  TScore in Hips= -0.8.Marland KitchenMarland Kitchen  BORDERLINE B 12 level - Vit B12 level 5/10 = 267 (Hg= 12.6, MCV= 89)... rec to start Vit B12 supplement 100-500 micrograms/d w/ f/u level on return...  ANXIETY DEPRESSION (ICD-300.4) - on LEXAPRO 20mg /d and XANAX 0.25mg  Bid...  INSOMNIA (ICD-780.52)   Preventive Screening-Counseling & Management  Alcohol-Tobacco     Smoking Status: never  Allergies: 1)  ! Sulfa 2)  ! * Statin Drugs...  Comments:  Nurse/Medical Assistant: The patient's medications and  allergies were reviewed with the patient and were updated in the Medication and Allergy Lists.  Past History:  Past Medical History: HYPERTENSION (ICD-401.9) CHEST PAIN, ATYPICAL (ICD-786.59) PAROXYSMAL ATRIAL FIBRILLATION (ICD-427.31) CEREBROVASCULAR DISEASE (ICD-437.9) HYPERLIPIDEMIA (ICD-272.4) DIABETES MELLITUS (ICD-250.00) HYPOTHYROIDISM (ICD-244.9) DIVERTICULOSIS OF COLON (ICD-562.10) COLONIC POLYPS (ICD-211.3) SEBACEOUS CYST, BREAST (ICD-610.8) UTI (ICD-599.0) DEGENERATIVE JOINT DISEASE (ICD-715.90) FIBROMYALGIA (ICD-729.1) VITAMIN D DEFICIENCY (ICD-268.9) HEADACHE (ICD-784.0) ANXIETY DEPRESSION (ICD-300.4) INSOMNIA (ICD-780.52)  Past Surgical History: S/P hysterectomy w/ cystocele & rectocele repairs 1990 by DrMcPhail S/P right colectomy for villous adenoma of the cecum 6/05 by DrStreck  Family History: Reviewed history from 02/13/2008 and no changes required. father died at 52 with a lymphoma (also had h/o hypertension, MI and elevated cholesterol) Mother died after age 68 (h/o hypertension) 1 brother with allergies 3 sisters  with hypertension, elevated choleterol and fibromyalgia  Social History: Reviewed history from 02/13/2008 and no changes required. divorced. previously worked at [employer]. had 3 children (1 had Down's syndrome and died at 17 months of age). Consumes alcohol periodically. nonsmoker.  Review of Systems       The patient complains of fatigue, malaise, nasal congestion, dyspnea on exertion, joint pain, and stiffness.  The patient denies fever, chills, sweats, anorexia, weakness, weight loss, sleep disorder, blurring, diplopia, eye irritation, eye discharge, vision loss, eye pain, photophobia, earache, ear discharge, tinnitus, decreased hearing, nosebleeds, sore throat, hoarseness, chest pain, palpitations, syncope, orthopnea, PND, peripheral edema, cough, dyspnea at rest, excessive sputum, hemoptysis, wheezing, pleurisy, nausea, vomiting, diarrhea,  constipation, change in bowel habits, abdominal pain, melena, hematochezia, jaundice, gas/bloating, indigestion/heartburn, dysphagia, odynophagia, dysuria, hematuria, urinary frequency, urinary hesitancy, nocturia, incontinence, back pain, joint swelling, muscle cramps, muscle weakness, arthritis, sciatica, restless legs, leg pain at night, leg pain with exertion, rash, itching, dryness, suspicious lesions, paralysis, paresthesias, seizures, tremors, vertigo, transient blindness, frequent falls, frequent headaches, difficulty walking, depression, anxiety, memory loss, confusion, cold intolerance, heat intolerance, polydipsia, polyphagia, polyuria, unusual weight change, abnormal bruising, bleeding, enlarged lymph nodes, urticaria, allergic rash, hay fever, and recurrent infections.    Vital Signs:  Patient profile:   75 year old female Height:      63 inches Weight:      151.50 pounds BMI:     26.93 O2 Sat:      97 % on Room air Temp:     97.4 degrees F oral Pulse rate:   74 / minute BP sitting:   118 / 74  (left arm) Cuff size:   regular  Vitals Entered By: Randell Loop CMA (February 15, 2010 10:31 AM)  O2 Sat at Rest %:  97 O2 Flow:  Room air CC: 19 month ROV & review of mult medical problems... Is Patient Diabetic? Yes Pain Assessment Patient in pain? no      Comments no changes in meds today   Physical Exam  Additional Exam:  WD, WN, 75  y/o WF in NAD... GENERAL:  Alert & oriented; pleasant & cooperative... HEENT:  [place]/AT, EOM-wnl, PERRLA, EACs-clear, TMs-wnl, NOSE-clear, THROAT-clear & wnl. NECK:  Supple w/ fairROM; no JVD; normal carotid impulses w/o bruits; no thyromegaly or nodules palpated; no lymphadenopathy. CHEST:  Clear to P & A; without wheezes/ rales/ or rhonchi. HEART:  Regular Rhythm; without murmurs/ rubs/ or gallops. ABDOMEN:  Soft & nontender; normal bowel sounds; no organomegaly or masses detected, no guarding or rebound, neg CVA tenderness  EXT: without  deformities, mild arthritic changes; no varicose veins/ venous insuffic/ or edema. NEURO:  CN's intact; motor testing normal; sensory testing normal; gait normal & balance OK. DERM:  No lesions noted; no rash [email or web address]    CXR  Procedure date:  02/15/2010  Findings:      CHEST - 2 VIEW Comparison: 02/13/2006 and 07/21/2003   Findings: The cardiomediastinal silhouette is unremarkable. Mild peribronchial thickening is again noted. There is no evidence of focal airspace disease, pulmonary edema, pulmonary nodule/mass, pleural effusion, or pneumothorax. No acute bony abnormalities are identified.   IMPRESSION: No evidence of active cardiopulmonary disease.   Read By:  Rosendo Gros,  M.D.   MISC. Report  Procedure date:  02/15/2010  Findings:      BMP (METABOL)   Sodium                    136 mEq/L  135-145   Potassium                 4.0 mEq/L                   3.5-5.1   Chloride                  100 mEq/L                   96-112   Carbon Dioxide            26 mEq/L                    19-32   Glucose              [H]  162 mg/dL                   04-54   BUN                       17 mg/dL                    0-98   Creatinine                0.7 mg/dL                   1.1-9.1   Calcium                   9.5 mg/dL                   4.7-82.9   GFR                       81.88 mL/min                >60.00  Hepatic/Liver Function Panel (HEPATIC)   Total Bilirubin           0.8 mg/dL                   5.6-2.1   Direct Bilirubin          0.1 mg/dL                   3.0-8.6   Alkaline Phosphatase      69 U/L                      39-117   AST                       10 U/L                      0-37   ALT                  [H]  40 U/L                      0-35   Total Protein             7.3 g/dL                    5.7-8.4   Albumin                   4.1 g/dL  3.5-5.2  CBC Platelet w/Diff (CBCD)   White Cell Count          8.5 K/uL                     4.5-10.5   Red Cell Count            4.17 Mil/uL                 3.87-5.11   Hemoglobin                13.0 g/dL                   82.5-05.3   Hematocrit                37.7 %                      36.0-46.0   MCV                       90.4 fl                     78.0-100.0   Platelet Count            273.0 K/uL                  150.0-400.0   Neutrophil %              69.0 %                      43.0-77.0   Lymphocyte %              22.2 %                      12.0-46.0   Monocyte %                7.4 %                       3.0-12.0   Eosinophils%              1.3 %                       0.0-5.0   Basophils %               0.1 %                       0.0-3.0  Comments:      TSH (TSH)   FastTSH                   3.21 uIU/mL                 0.35-5.50  Lipid Panel (LIPID)   Cholesterol          [H]  244 mg/dL                   9-767   Triglycerides        [H]  278.0 mg/dL                 3.4-193.7   HDL                       90.24 mg/dL                 >  39.00  Cholesterol LDL - Direct                             176.1 mg/dL  Hemoglobin X9J (Y7W)   Hemoglobin A1C       [H]  8.6 %                       4.6-6.5   Impression & Recommendations:  Problem # 1:  HYPERTENSION (ICD-401.9) BP stable on Mavik>  continue same, she insists on the Non-generic Rx. Her updated medication list for this problem includes:    Mavik 2 Mg Tabs (Trandolapril) .Marland Kitchen... Take 1 tablet by mouth once a day  Orders: T-2 View CXR (71020TC) TLB-BMP (Basic Metabolic Panel-BMET) (80048-METABOL) TLB-Hepatic/Liver Function Pnl (80076-HEPATIC) TLB-CBC Platelet - w/Differential (85025-CBCD) TLB-TSH (Thyroid Stimulating Hormone) (84443-TSH) TLB-Lipid Panel (80061-LIPID) TLB-A1C / Hgb A1C (Glycohemoglobin) (83036-A1C)  Problem # 2:  PAROXYSMAL ATRIAL FIBRILLATION (ICD-427.31) Followed by Walker Kehr for Cards>  stable, same meds.  Problem # 3:  CEREBROVASCULAR DISEASE (ICD-437.9) CDopplers are stable>  bilat  carotid dis, continue ASA, needs to get chol & BS under control, f/u 89yr.  Problem # 4:  HYPERLIPIDEMIA (ICD-272.4) Poor control on diet alone, intol to all statins & not willing to re-try, wants Zetia trial> OK... Her updated medication list for this problem includes:    Zetia 10 Mg Tabs (Ezetimibe) .Marland Kitchen... Take 1 tab by mouth once daily...  Problem # 5:  DIABETES MELLITUS (ICD-250.00) Poor control on her non-existant diet/ exercise & current meds>  needs to get on diet, incr exerc, & incr Glucov 5-500 Bid... Her updated medication list for this problem includes:    Mavik 2 Mg Tabs (Trandolapril) .Marland Kitchen... Take 1 tablet by mouth once a day    Glyburide-metformin 5-500 Mg Tabs (Glyburide-metformin) .Marland Kitchen... 1/2  by mouth two times a day    Januvia 100 Mg Tabs (Sitagliptin phosphate) .Marland Kitchen... 1 by mouth once daily  Problem # 6:  HYPOTHYROIDISM (ICD-244.9) Stable on the Synthroid ... Her updated medication list for this problem includes:    Levothyroxine Sodium 100 Mcg Tabs (Levothyroxine sodium) .Marland Kitchen... Take 1 tablet by mouth once a day  Problem # 7:  COLONIC POLYPS (ICD-211.3) She is due for f/u colonoscopy & will contact [facility] for this important screening procedure...  Problem # 8:  UTI (ICD-599.0) She was eval by DrOttelin for recurrent UTIs...  Problem # 9:  OTHER MEDICAL PROBLEMS AS NOTED>>>  Complete Medication List: 1)  Mavik 2 Mg Tabs (Trandolapril) .... Take 1 tablet by mouth once a day 2)  Zetia 10 Mg Tabs (Ezetimibe) .... Take 1 tab by mouth once daily.Marland KitchenMarland Kitchen 3)  Glyburide-metformin 5-500 Mg Tabs (Glyburide-metformin) .... 1/2  by mouth two times a day 4)  Januvia 100 Mg Tabs (Sitagliptin phosphate) .Marland Kitchen.. 1 by mouth once daily 5)  Levothyroxine Sodium 100 Mcg Tabs (Levothyroxine sodium) .... Take 1 tablet by mouth once a day 6)  Daypro 600 Mg Tabs (Oxaprozin) .Marland Kitchen.. 1 once daily as needed joint pain with food 7)  Lexapro 20 Mg Tabs (Escitalopram oxalate) .... Take 1 tablet by mouth once  a day 8)  Super B Complex Tabs (B complex-c) .... Take 1 tablet by mouth once a day 9)  Vitamin B-12 100 Mcg Tabs (Cyanocobalamin) .... Take 1 tab by mouth once daily... 10)  Vitamin D 1000 Unit Tabs (Cholecalciferol) .... Take 2 by mouth once daily 11)  Magnesium (  magnesium)  .Marland Kitchen.. 1  tab by mouth once daily 12)  Relion Micro W/device Kit (Blood glucose monitoring suppl) .... Monitor blood sugars dx 250.00 13)  Relion Micro Test Strp (Glucose blood) .... Check blood sugars once daily dx 250.00  Patient Instructions: 1)  Today we updated your med list- see below.... 2)  We refilled your meds for 2012... 3)  We wrote a new perscriptio for ZETIA to see if this will help your cholesterol level... 4)  You need to get on a better low carb, low fat diet to improve your Lipid progile & BS values.Marland KitchenMarland Kitchen 5)  Today we did your follow up FASTING blood work & a CXR... please call the "phone tree" in a few days for your results.Marland KitchenMarland Kitchen 6)  Call for any problems.Marland KitchenMarland Kitchen 7)  Please schedule a follow-up appointment in 6 months. Prescriptions: ZETIA 10 MG TABS (EZETIMIBE) take 1 tab by mouth once daily...  #30 x 12   Entered and Authorized by:   Michele Mcalpine MD   Signed by:   Michele Mcalpine MD on 02/15/2010   Method used:   Print then Give to Patient   RxID:   [id number] LEXAPRO 20 MG TABS (ESCITALOPRAM OXALATE) Take 1 tablet by mouth once a day  #30 x 6   Entered and Authorized by:   Michele Mcalpine MD   Signed by:   Michele Mcalpine MD on 02/15/2010   Method used:   Print then Give to Patient   RxID:   [id number] DAYPRO 600 MG  TABS (OXAPROZIN) 1 once daily as needed joint pain with food  #30 x 6   Entered and Authorized by:   Michele Mcalpine MD   Signed by:   Michele Mcalpine MD on 02/15/2010   Method used:   Print then Give to Patient   RxID:   [id number] LEVOTHYROXINE SODIUM 100 MCG TABS (LEVOTHYROXINE SODIUM) Take 1 tablet by mouth once a day  #30 x 12   Entered and Authorized by:   Michele Mcalpine MD    Signed by:   Michele Mcalpine MD on 02/15/2010   Method used:   Print then Give to Patient   RxID:   [id number] JANUVIA 100 MG TABS (SITAGLIPTIN PHOSPHATE) 1 by mouth once daily  #30 x 12   Entered and Authorized by:   Michele Mcalpine MD   Signed by:   Michele Mcalpine MD on 02/15/2010   Method used:   Print then Give to Patient   RxID:   [id number] GLYBURIDE-METFORMIN 5-500 MG TABS (GLYBURIDE-METFORMIN) 1/2  by mouth two times a day  #30 x 12   Entered and Authorized by:   Michele Mcalpine MD   Signed by:   Michele Mcalpine MD on 02/15/2010   Method used:   Print then Give to Patient   RxID:   [id number] MAVIK 2 MG TABS (TRANDOLAPRIL) Take 1 tablet by mouth once a day  #30 x 12   Entered and Authorized by:   Michele Mcalpine MD   Signed by:   Michele Mcalpine MD on 02/15/2010   Method used:   Print then Give to Patient   RxID:   [id number]

## 2010-03-21 NOTE — Progress Notes (Signed)
Summary: prescription  Phone Note Call from Patient Call back at Home Phone [phone]   Caller: Patient Call For: Dr. Kriste Basque Summary of Call: Patient was seen in December and had the flu shot and she thinks that she has the flu. She wanted to know [facility] [address] [facility] [place]. She  has a dry cough but sometimes it sounds like there is something in her chest but she cant cough anything up. States that she feels raw in her throat and down into her chest. She doesnt think that she has a fever now. She can be reached at [phone] Initial call taken by: Vedia Coffer,  March 12, 2010 9:47 AM  Follow-up for Phone Call        Spoke with pt.  She is c/o cough- dry, runny nose, aches all over x 3 days.  She states that she her son has the flu and and this is what she probably has.  She is requesting med for flu and for her cough- she states that she does not know what is safe to take otc since she has DM.  Pls advise thanks allergic to sulfa Follow-up by: Vernie Murders,  March 12, 2010 10:17 AM  Additional Follow-up for Phone Call Additional follow up Details #1::        per SN---ok for pt to have zpak #1  take as directed, use mucinex 600mg    2 by mouth two times a day with plenty of fluids and tylenol every 4 hours as needed .  thanks Randell Loop CMA  March 12, 2010 10:57 AM     Additional Follow-up for Phone Call Additional follow up Details #2::    Pt informed of Dr Jodelle Green recommnendations. Abigail Miyamoto RN  March 12, 2010 11:16 AM   New/Updated Medications: AZITHROMYCIN 250 MG TABS (AZITHROMYCIN) Take 2 tabs the first day then 1 tab a day until gone Prescriptions: AZITHROMYCIN 250 MG TABS (AZITHROMYCIN) Take 2 tabs the first day then 1 tab a day until gone  #6 x 0   Entered by:   Abigail Miyamoto RN   Authorized by:   Michele Mcalpine MD   Signed by:   Abigail Miyamoto RN on 03/12/2010   Method used:   [facility]        [facility]   [address] ([facility])       [address]       [place], [place]  [address]       Ph: [phone] or [phone]       Fax: [phone]   RxID:   [id number]

## 2010-03-25 ENCOUNTER — Telehealth: Payer: Self-pay | Admitting: Pulmonary Disease

## 2010-03-26 ENCOUNTER — Encounter: Payer: Self-pay | Admitting: Pulmonary Disease

## 2010-04-04 NOTE — Progress Notes (Signed)
Summary: Mavix BMN form  Phone Note Call from Patient Call back at Home Phone [phone]   Caller: Patient Call For: Deng Kemler Summary of Call: pt needs nurse to call [employer] and state that her rx for MAVIK IS "BRAND NAME NECESSARY". PT ID # [id number].  call [phone] Initial call taken by: Tivis Ringer, CNA,  March 25, 2010 3:58 PM  Follow-up for Phone Call        [id number] to verify does pt need prior auth? Carron Curie CMA  March 25, 2010 4:12 PM  Returning call.Darletta Moll  March 25, 2010 4:18 PM   Additional Follow-up for Phone Call Additional follow up Details #1::        Spoke with pt.  She states that her ins is not wanting to cover brand name mavik, and they want her to take generic form. She states that she had tried generic form before and this caused her to have severe depression.  She states that she does not want to switch to any other BP meds. She states that she spoke with [employer] this am and they advised to have [name] call them to let them know she needs brand name med.   I called ins and waited over 5 min.  Will call back later Vernie Murders  March 25, 2010 4:39 PM     Additional Follow-up for Phone Call Additional follow up Details #2::    [employer] will send form over for brand name necc. being sent to [phone], will await fax and give to sn to fill out  Philipp Deputy [facility]  March 26, 2010 9:27 AM   form received, completed and faxed to [facility]. Placed in SN scan folder.Carron Curie CMA  March 26, 2010 5:39 PM    Appended Document: Mavix BMN form [employer] left message on [employer] vmail stating pt's Mavik approved x 1 year.

## 2010-04-10 NOTE — Medication Information (Signed)
Summary: Mavik/[employer]  Mavik/[employer]   Imported By: Sherian Rein 04/01/2010 14:17:01  _____________________________________________________________________  External Attachment:    Type:   Image     Comment:   External Document

## 2010-04-15 ENCOUNTER — Telehealth: Payer: Self-pay | Admitting: Pulmonary Disease

## 2010-04-23 ENCOUNTER — Ambulatory Visit: Payer: Self-pay | Admitting: Pulmonary Disease

## 2010-04-25 NOTE — Progress Notes (Signed)
Summary: would like prescription called in for UTI  Phone Note Call from Patient   Caller: Patient Call For: [name] Summary of Call: patient phoned and stated that she has a UTI and [facility]. she stated that the only thing that kncoks it out is the doxy. She can be reached at [phone]. Patient stated that she is hurting really bad. She has a follow  up scheduled for 04/23/10 Initial call taken by: Vedia Coffer,  April 15, 2010 4:35 PM  Follow-up for Phone Call        SN please advise if ok to send in doxy for ?UTI--thanks Randell Loop CMA  April 15, 2010 5:10 PM   allergies are sulfa and statin drugs  Additional Follow-up for Phone Call Additional follow up Details #1::        per SN---ok for pt to have doxy 100mg   #14  1 by mouth two times a day until gone with no refills.  this has been sent to her pharmacy ...called pt and she is aware of meds sent to the pharmacy Randell Loop CMA  April 15, 2010 5:21 PM     New/Updated Medications: DOXYCYCLINE HYCLATE 100 MG CAPS (DOXYCYCLINE HYCLATE) take one capsule by mouth two times a day until gone Prescriptions: DOXYCYCLINE HYCLATE 100 MG CAPS (DOXYCYCLINE HYCLATE) take one capsule by mouth two times a day until gone  #14 x 0   Entered by:   Randell Loop CMA   Authorized by:   Michele Mcalpine MD   Signed by:   Randell Loop CMA on 04/15/2010   Method used:   [facility]        [facility]  [address] (retail)       [address]       [place], [place]  [address]       Ph: [phone] or [phone]       Fax: [phone]   RxID:   [id number]

## 2010-05-29 ENCOUNTER — Telehealth: Payer: Self-pay | Admitting: Pulmonary Disease

## 2010-05-29 MED ORDER — GLYBURIDE-METFORMIN 5-500 MG PO TABS: ORAL_TABLET | ORAL | Status: DC

## 2010-05-29 NOTE — Telephone Encounter (Signed)
Spoke w/ pt and she states when she was last seen 01/2010 Dr. Kriste Basque increased her Glyburide-metformin 5-500 Mg to 1 tablet BID. Pt states her rx was written for 1/2 tab BID. I looked into pt chart and only saw pt was take take Glyburide-metformin 5-500 Mg 1/2 tab BID. Pt has ran out of her medication bc she has been taking 1 tablet BID bc she was informed to take it this way. Pt needs refills sent to the pharmacy. Please advise Dr. Kriste Basque on how pt should be taking her medication. Thanks Pt has an up coming apt 4/23 w/ SN  Carver Fila, CMA

## 2010-05-29 NOTE — Telephone Encounter (Signed)
Spoke w/ pt and advised her rx was sent to pharmacy. Pt verbalized understanding and nothing further was needed. Pt is scheduled w/ SN 4/23 at 3:30. Pt aware of apt

## 2010-05-29 NOTE — Telephone Encounter (Signed)
Per SN-ok to call in the metformin 5-500  1 po bid #60 per month  Refill x 2--pt will need ov with SN in 1 month. thanks

## 2010-06-04 ENCOUNTER — Telehealth: Payer: Self-pay | Admitting: Pulmonary Disease

## 2010-06-04 DIAGNOSIS — E119 Type 2 diabetes mellitus without complications: Secondary | ICD-10-CM

## 2010-06-04 DIAGNOSIS — E785 Hyperlipidemia, unspecified: Secondary | ICD-10-CM

## 2010-06-04 DIAGNOSIS — E559 Vitamin D deficiency, unspecified: Secondary | ICD-10-CM

## 2010-06-04 DIAGNOSIS — I10 Essential (primary) hypertension: Secondary | ICD-10-CM

## 2010-06-04 NOTE — Telephone Encounter (Signed)
Please advise of what labs we need to place in EPIC.

## 2010-06-04 NOTE — Telephone Encounter (Signed)
Called and spoke with pt and she is aware to come fasting in the morning to the lab for her lab check.  Pt has appt with SN on monday

## 2010-06-06 ENCOUNTER — Other Ambulatory Visit: Payer: Self-pay | Admitting: Pulmonary Disease

## 2010-06-06 ENCOUNTER — Other Ambulatory Visit (INDEPENDENT_AMBULATORY_CARE_PROVIDER_SITE_OTHER): Payer: Self-pay

## 2010-06-06 DIAGNOSIS — I10 Essential (primary) hypertension: Secondary | ICD-10-CM

## 2010-06-06 DIAGNOSIS — E785 Hyperlipidemia, unspecified: Secondary | ICD-10-CM

## 2010-06-06 DIAGNOSIS — E119 Type 2 diabetes mellitus without complications: Secondary | ICD-10-CM

## 2010-06-06 DIAGNOSIS — E559 Vitamin D deficiency, unspecified: Secondary | ICD-10-CM

## 2010-06-06 LAB — HEPATIC FUNCTION PANEL
AST: 35 U/L (ref 0–37)
Albumin: 3.9 g/dL (ref 3.5–5.2)
Alkaline Phosphatase: 57 U/L (ref 39–117)
Bilirubin, Direct: 0.1 mg/dL (ref 0.0–0.3)
Total Bilirubin: 0.7 mg/dL (ref 0.3–1.2)

## 2010-06-06 LAB — BASIC METABOLIC PANEL
BUN: 11 mg/dL (ref 6–23)
Calcium: 9.6 mg/dL (ref 8.4–10.5)
Creatinine, Ser: 0.7 mg/dL (ref 0.4–1.2)

## 2010-06-06 LAB — LIPID PANEL
Cholesterol: 170 mg/dL (ref 0–200)
HDL: 41.3 mg/dL (ref >39.00)
VLDL: 35 mg/dL (ref 0.0–40.0)

## 2010-06-10 ENCOUNTER — Ambulatory Visit (INDEPENDENT_AMBULATORY_CARE_PROVIDER_SITE_OTHER): Payer: Medicare Other | Admitting: Pulmonary Disease

## 2010-06-10 ENCOUNTER — Encounter: Payer: Self-pay | Admitting: Pulmonary Disease

## 2010-06-10 DIAGNOSIS — F341 Dysthymic disorder: Secondary | ICD-10-CM

## 2010-06-10 DIAGNOSIS — E039 Hypothyroidism, unspecified: Secondary | ICD-10-CM

## 2010-06-10 DIAGNOSIS — E119 Type 2 diabetes mellitus without complications: Secondary | ICD-10-CM

## 2010-06-10 DIAGNOSIS — E785 Hyperlipidemia, unspecified: Secondary | ICD-10-CM

## 2010-06-10 DIAGNOSIS — IMO0001 Reserved for inherently not codable concepts without codable children: Secondary | ICD-10-CM

## 2010-06-10 DIAGNOSIS — I679 Cerebrovascular disease, unspecified: Secondary | ICD-10-CM

## 2010-06-10 DIAGNOSIS — I4891 Unspecified atrial fibrillation: Secondary | ICD-10-CM

## 2010-06-10 DIAGNOSIS — I10 Essential (primary) hypertension: Secondary | ICD-10-CM

## 2010-06-10 MED ORDER — GLYBURIDE-METFORMIN 5-500 MG PO TABS: ORAL_TABLET | ORAL | Status: DC

## 2010-06-10 NOTE — Progress Notes (Signed)
Subjective:    Patient ID: Jaclyn [name], female    DOB: 1935-03-17, 75 y.o.   MRN: [id number]  HPI 75 y/o WF here for a follow up visit... she has multiple medical problems as noted below- followed for HBP, PAF, Carotid disease, Hyperchol, DM, Hypothy, FM, Vit D defic, anxiety/ depression... she is also followed by Walker Kehr for Cardiology, and DrWeissman for GI.Marland Kitchen.  ~  February 15, 2010:  19 mo ROV- doing fair> she's had repeated bladder infections & saw DrOttelin 9/11- Cipro w/o help but she notes that Doxy worked well... BP controlled on Mavik but she notes that the generic "depresses me" but she doesn't want to change meds- just wants the non-generic rx...  she had f/u Cards 12/11- DrNishan, no signif CAD, atypCP, f/u CDopplers w/ mod plaque & bilat ICA stenoses- stable...  she has been trying to control Lipids w/ diet alone but unsuccessful & she wants Zetia trial... BS poorly controlled but she is not on diet & needs to incr meds (see below)... she notes heartburn but states that daily "mustard" really helps...  we discussed diet + exercise & need for more regular f/u visits.  ~  June 10, 2010:  34mo ROV & stable, just incr stress w/ 37 y/o daughter's alcoholism and health issues;  She had labs done last week and FLP improved on the Zetia, but BS/ A1c about the same (on Glucovance & Januvia);  BP controlled & she denies CP/ palpit, etc > see prob list below>         Problem List:  HYPERTENSION (ICD-401.9) - on MAVIK 2mg /d (she requests the non-generic Mavik because the generic "depresses me")...   ~  12/11:  BP today= 118/74 & feeling well, takes med regularly and tolerates well... denies visual changes, CP, palipit, dizziness, syncope, dyspnea, edema, etc...  ~  4/12:  BP= 134/64 & she continues to be essent asymptomatic...  CHEST PAIN, ATYPICAL (ICD-786.59) - she had cath for atyp CP 1/08 showing no signif CAD, EF= 60%... prev CWP part of her FM complex...  PAROXYSMAL ATRIAL FIBRILLATION  (ICD-427.31) - followed by Walker Kehr & his notes are reviewed... she is holding NSR without palpit or arrhythmias noted... ~  2DEcho 5/05 was normal- no valve dis, normal wall thickness, norm EF...  CEREBROVASCULAR DISEASE (ICD-437.9) - on ASA 81mg /d... she has a right carotid bruit and bilat carotid stenoses> denies cerebral ischemic symptoms... ~  CDopplers 11/09 showed mod plaque in the prox ICAs bilat R>L, 60-79%R & 40-59%L, stable- no change going back to 2005 studies. ~  5/10:  reminded to take her ASA daily. ~  f/u CDopplers 12/11 showed mod plaque, stable bilat carotid dis w/ 60-79% RICA & 40-59% LICA stenoses.  HYPERLIPIDEMIA (ICD-272.4) - on Zetia 10mg  +diet... she has refused Statin Rx- "It makes my bones hurt so bad"> went to the [facility]id Clinic but states she didn't tol Crestor5 "I hurt so bad- it's diff than my FM" & discomfort resolved off Rx... then tried FISH OIL & titrated up to 3/d but her f/u FLP was no better... ~  FLP 12/07 showed TChol 192, TG 172, HDL 38, LDL 120... on diet alone... pt refuses Statin Rx. ~  FLP 9/09 showed TChol 230, TG 203, HDL 38, LDL 176... rec- refer to LipidClinic ~  FLP 5/10 on FishOil showed TChol 231, TG 145, HDL 41, LDL 169 ~  FLP 12/11 showed TChol 244, TG 278, HDL 42, LDL 176...  try ZETIA 10mg /d. ~  FLP 4/12  on Zetia10 showed TChol 170, TG 175, HDL 41, LDL 94... Continue Zetia & diet...  DIABETES MELLITUS (ICD-250.00) - now on GLUCOVANCE 5-500 Bid and JANUVIA 100mg /d... ~  labs 2/09 showed BS= 180, HgA1c= 7.8.Marland KitchenMarland Kitchen on Glucov 2.5-500 taking 1 daily. ~  labs 9/09 showed BS= 219, HgA1c= 7.4... she ignored advise to incr to Bid. ~  discussed w/ pt 11/09: incr to Glucovance 5/500 Bid & Januvia 100mg /d... ~  labs 5/10 still on one of each (wt down 7# to 148#) showed BS= 167, A1c= 6.9 ~  labs 12/11 showed BS= 162, A1c= 8.6.Marland KitchenMarland Kitchen rec to incr Glucov5/500 Bid + Januv100/d. ~  Labs 4/12 showed BS=160, A1c=8.4.Marland KitchenMarland Kitchen rec incr to Glucov5/500- 2AM, 1PM;  +Januv100/d.  HYPOTHYROIDISM (ICD-244.9) - now on SYNTHROID 119mcg/d...  ~  labs 11/08 showed TSH= 10.98... ?if taking med regularly? ~  labs 9/09 showed TSH= 0.63... rec- continue same dose. ~  labs 5/10 showed TSH= 0.49 ~  labs 12/11 showed TSH= 3.21  DIVERTICULOSIS OF COLON (ICD-562.10) & COLONIC POLYPS (ICD-211.3) - followed by DrWeissman... last colonoscopy 3/05 w divertics and cecal villous adenoma- referred to DrStreck w/ right colectomy 6/05 (no cancer in specimen).  SEBACEOUS CYST, BREAST (ICD-610.8) - infected seb cyst left chest wall treated in 2009 w/ Keflex & resolved.  DEGENERATIVE JOINT DISEASE (ICD-715.90) - Eval and Rx by DrRamos in 2007... calcif tendonitis in shoulders, DDD in CSpine... given Naprosyn & phys therapy... currently taking DAYPRO 600mg /d Prn...  FIBROMYALGIA (ICD-729.1) - on Daypro as noted... not interested in Lyrica/ Savella/ Rheum consult... ~  She has chronic pain & trigger points assoc w/ the FM...  VITAMIN D DEFICIENCY (ICD-268.9) - she has had a very low Vit D level = 9 on 7/08 & 9/09... she was perscribed Vit D 50,000 u weekly but apparently never took this medication... we wrote it for her again w/ instructions to take it weekly until we tell her to stop, but she only took it for 4 weeks then stopped on her own in favor of 1000 u daily OTC supplement... ~  BMD 7/08 was WNL-  TScore in Spine= +0.6,  TScore in Hips= -0.8.Marland Kitchen. ~   f/u Vit D level 5/10= 29, therefore continue the Vit D 1000/d. ~  F/u Vit D level 4/12 = 29 & she admits not taking the OTC supplement... rec incr to 2000u daily.  BORDERLINE B 12 level > Vit B12 level 5/10 = 267 (Hg= 12.6, MCV= 89)... rec to start Vit B12 supplement 100-500 micrograms/d w/ f/u level on return...  ANXIETY DEPRESSION (ICD-300.4) - on LEXAPRO 20mg /d & notes that this really helps... INSOMNIA (ICD-780.52)   Past Surgical History  Procedure Date  . Vesicovaginal fistula closure w/ tah 1990    w/ cystocele  &retocele repairs Dr. Elana Alm  . Right colectomy 2005    for villous adenoma of the cecum Dr.streck    Outpatient Encounter Prescriptions as of 06/10/2010  Medication Sig Dispense Refill  . cholecalciferol (VITAMIN D) 1000 UNITS tablet Take 2 Units by mouth daily.        Marland Kitchen doxycycline (VIBRAMYCIN) 100 MG capsule Take 100 mg by mouth 2 (two) times daily. Until gone       . escitalopram (LEXAPRO) 20 MG tablet Take 20 mg by mouth daily.        Marland Kitchen ezetimibe (ZETIA) 10 MG tablet Take 10 mg by mouth daily.        Marland Kitchen glucose blood test strip 1 each by Other route as needed. Use  as instructed check blood sugars once daily       . glyBURIDE-metformin (GLUCOVANCE) 5-500 MG per tablet 1 tablet by mouth twice a day  60 tablet  2  . levothyroxine (SYNTHROID, LEVOTHROID) 100 MCG tablet Take 100 mcg by mouth daily.        Marland Kitchen oxaprozin (DAYPRO) 600 MG tablet Take 1,200 mg by mouth. 1 once daily as needed joint pain with food.       . sitaGLIPtan (JANUVIA) 100 MG tablet Take 100 mg by mouth daily.        . trandolapril (MAVIK) 2 MG tablet Take 2 mg by mouth daily.        . vitamin B-12 (CYANOCOBALAMIN) 100 MCG tablet Take 50 mcg by mouth daily.          Allergies  Allergen Reactions  . Sulfonamide Derivatives     Review of Systems         See HPI - all other systems neg except as noted... The patient complains of headaches.  The patient denies anorexia, fever, weight loss, weight gain, vision loss, decreased hearing, hoarseness, chest pain, syncope, dyspnea on exertion, peripheral edema, prolonged cough, hemoptysis, abdominal pain, melena, hematochezia, severe indigestion/heartburn, hematuria, incontinence, muscle weakness, suspicious skin lesions, transient blindness, difficulty walking, depression, unusual weight change, abnormal bleeding, enlarged lymph nodes, and angioedema.     Objective:   Physical Exam      WD, WN, 75  y/o WF in NAD... GENERAL:  Alert & oriented; pleasant &  cooperative... HEENT:  [place]/AT, EOM-wnl, PERRLA, EACs-clear, TMs-wnl, NOSE-clear, THROAT-clear & wnl. NECK:  Supple w/ fairROM; no JVD; normal carotid impulses w/o bruits; no thyromegaly or nodules palpated; no lymphadenopathy. CHEST:  Clear to P & A; without wheezes/ rales/ or rhonchi. HEART:  Regular Rhythm; without murmurs/ rubs/ or gallops. ABDOMEN:  Soft & nontender; normal bowel sounds; no organomegaly or masses detected, no guarding or rebound, neg CVA tenderness  EXT: without deformities, mild arthritic changes; no varicose veins/ venous insuffic/ or edema. NEURO:  CN's intact; motor testing normal; sensory testing normal; gait normal & balance OK. DERM:  No lesions noted; no rash [email or web address]   Assessment & Plan:   HBP>  Controlled on Mavik but refuses the generic as noted above...  CP/ FM>  She has had atypCP as part of her severe FM problem;  Recently stable w/o pain exac & doing satis on Daypro/ Tylenol/ etc...  Hx AFib>  Holding NSR, remains regular w/o CP, palpit, racing, etc... She knows to avoid caffeine etc...  Cerebrovasc dis>  Stable on ASA w/ CDopplers followed at the [facility]...  Hyperlipid>  Nice response to Zetia 10mg /d> continue same...  DM>  This remains suboptimal & we discussed meds & medication/ diet/ exercise compliance etc;  Decided to incr the Glucovance 5/500 to 2 in the AM and continue 1 at dinner (w/ the Januvia);  Watch sugars at home & really needs better diet effort...  Vit D defic>  She really needs to take the supplement, and asked to incr to 2000 u daily...  Anxiety>  She likes the Lexapro & gets $$ help w/ this & wants to continue.Marland KitchenMarland Kitchen

## 2010-06-10 NOTE — Patient Instructions (Signed)
Today we updated your med list in our EPIC system...    We decided to incr the GLUCOVANCE (Metformin/ Glyburide) to 2 tabs w/ breakfast, and 1 tab at dinner...    Keep your other meds the same...  Continue your low carb, low fat diet... Call for any questions... Let's plan another follow up visit in 4 months to recheck these numbers.Marland KitchenMarland Kitchen

## 2010-06-28 ENCOUNTER — Telehealth: Payer: Self-pay | Admitting: Pulmonary Disease

## 2010-06-28 MED ORDER — DOXYCYCLINE HYCLATE 100 MG PO CAPS: 100.0000 mg | ORAL_CAPSULE | Freq: Two times a day (BID) | ORAL | Status: AC

## 2010-06-28 NOTE — Telephone Encounter (Signed)
Called, spoke with pt.  She c/o "bladder infection."  States she is having burning and hurting in bladder area "all the time."  States this started last week.  Denies any itching, foul odor in urine, f/c/s, or abnormal back/side pain.  States she has taken doxy in the past and this worked well.  Requesting abx.  Allergies verified.  [facility] [place]e Basque, pls advise.  Thanks!   Allergies  Allergen Reactions  . Sulfonamide Derivatives

## 2010-06-28 NOTE — Telephone Encounter (Signed)
Per SN---ok for pt to have doxy  100mg   #14  1 po bid  And refer to GYN if symptoms persist.  thanks

## 2010-06-28 NOTE — Telephone Encounter (Signed)
Called and spoke with pt.  Pt aware of SN's recs and rx sent to pharmacy.

## 2010-07-02 NOTE — Assessment & Plan Note (Signed)
[facility]                            CARDIOLOGY OFFICE NOTE   VERTIS, BAUDER                      MRN:          [id number]  DATE:02/02/2007                            DOB:          Mar 20, 1935    The patient returns today for follow-up.  I have seen her for atypical  chest pain, abnormal EKG, hypertension, and right carotid bruit.  She  has been doing well since I last saw her.  She has denied any  significant chest pain.  There has been no transient ischemic attacks.  She has not been taking aspirin and I encouraged her to take a baby  aspirin a day.   She does not check her blood pressure on a regular basis.  Reviewing her  primary care M.D.'s notes, it appears that her blood pressure has been  under good control.  She is not particularly strict with her diet and  does drink alcohol occasionally.   She has been compliant with her medicines.  Her Synthroid dose was  recently increased for a TSH of around 11.  This was done about a month  ago.   REVIEW OF SYSTEMS:  Otherwise negative.  In particular, she has not had  chest pain, TIA type symptoms, and no lower extremity edema, no  headaches.   MEDICATIONS:  1. Synthroid 100 mcg a day.  2. Mavik 2 a day.  3. Lexapro 20 a day.  4. B complex vitamins.  5. Mustard.  6. Vinegar.  7. Glyburide and Metformin for diabetes.   PHYSICAL EXAMINATION:  GENERAL:  Remarkable for a jovial, elderly, white  female in no distress.  VITAL SIGNS:  Weight is 151, blood pressure 150/70, pulse 59 and  regular, respiratory rate 16.  HEENT:  Unremarkable.  She has a right carotid bruit.  NECK:  Otherwise supple.  No JVP elevation, lymphadenopathy, or  thyromegaly.  LUNGS:  Clear.  Good diaphragmatic motion, no wheezing.  S1 and S2 with  normal heart sounds.  PMI normal.  ABDOMEN:  Benign.  Bowel sounds positive.  No AAA.  No  hepatosplenomegaly or hepatojugular reflux.  EXTREMITIES:  Distal pulses  intact, no edema.  NEUROLOGY:  Nonfocal.  No muscular weakness.  SKIN:  Warm and dry.   EKG shows sinus rhythm with left axis deviation and poor R wave  progression with low voltage.   IMPRESSION:  1. Previous atypical chest pain, no evidence of coronary artery      disease.  Catheterization in February 19, 2006, with normal coronary      arteries.  2. Abnormal EKG likely lead position with poor R wave progression.  No      evidence of previous pericardial effusion and no coronary disease.  3. Hypertension, currently well controlled.  Continue current dose of      Mavik.  4. Right carotid bruit.  I reviewed her Doppler study today.  It is      unchanged.  She has 60-79% right internal carotid artery stenosis      with a peak proximal internal carotid artery velocity of 1.8 meters  per second and a diastolic of 57.  She will have a follow-up Duplex      in a year.  Again I encouraged her to take a baby aspirin a day.  5. Hypothyroidism.  Synthroid dose increased.  Follow-up TSH in three      months.  6. Diabetes.  Continue Glyburide Met 25/500 and Metformin 500 a day.      Hemoglobin A1C quarterly.  7. Depression.  Continue Lexapro 20 mg a day.  Follow up with [facility].      Milinda Antis, M.D.     Noralyn Pick. Eden Emms, MD, [facility]  Electronically Signed    PCN/MedQ  DD: 02/02/2007  DT: 02/02/2007  Job #: [id number]

## 2010-07-02 NOTE — Assessment & Plan Note (Signed)
[facility]                               LIPID CLINIC NOTE   Jaclyn [name]                      MRN:          [id number]  DATE:01/17/2008                            DOB:          08/17/1935    Jaclyn [name] is seen in the Lipid Clinic as a new patient for evaluation  of medication titration associated with her hyperlipidemia.  She states  that she has restless leg syndrome all over her body and fibromyalgia.  She has a history of medication noncompliance and therapy noncompliance  as based on her chart reviewing by her in admission.  She states that  she is not taking aspirin, though she has been asked to do so by Dr.  Alroy Dust and Dr. Charlton Haws.  She misses at least 3 doses in a  month of her thyroid medication and takes others religiously.   PAST MEDICAL HISTORY:  Pertinent for diabetes, carotid stenosis with a  60-79% stenosis noted by Dr. Eden Emms in his last note, fibromyalgia,  restless leg syndrome, hot flashes that happen every 2-3 days for which  she tried Neurontin, but then felt better off medication otherwise and  foot pain, which is helped by Daypro.   ALLERGIES:  The patient's past statin history states that she is  allergic to all STATINS though she has not tried them based on her chart  and based on her own admission.  They simply make her hurt all over.   CURRENT MEDICATIONS:  1. Vinegar for fibromyalgia.  2. Glyburide and metformin 2.5 mg/500 mg twice daily.  3. Januvia 100 mg daily.  4. Aspirin 81 mg daily, which she admits she is not taking.  5. B complex and multivitamin daily.  6. Lexapro 20 mg daily.  7. Mavik 2 mg daily.  8. Synthroid 100 mcg daily.   DRUG ALLERGIES:  NAPROXEN, which yields fever and SULFA.   LABORATORY DATA:  Hyperlipidemia and normal LFTs.   PHYSICAL EXAMINATION:  VITAL SIGNS:  Weight is 154.5 pounds, heart rate  is 76, and respiratory rate is 18.   ASSESSMENT:  We spent a lot of time  talking about the potential for  benefit from lowering once cholesterol.  I have a high level of concern  that she is probably not going to take my recommendations more than  anyone else based on her historical campaign.  She promises me that she  will try.   PLAN:  1. We will try pravastatin 40 mg daily at bedtime.  2. We will recheck lipid, liver, and A1c in 6 weeks.  We will have a      followup visit at that time.  We took over 35 minutes to explain to      the patient the need for her aspirin and her statin based on      diabetes.  Pravastatin 40 mg tablets, quantity of 30 one at bedtime      with 1 refill was [facility]  I appreciate the      opportunity to see this patient.  I  am grateful that we can work      with her and obtain better risk profile modification for her in the      coming weeks.     Shelby Dubin, PharmD, BCPS, [name]  Electronically Signed    MP/MedQ  DD: 02/13/2008  DT: 02/13/2008  Job #: [id number]   cc:   Jaclyn Cloud. Kriste Basque, MD

## 2010-07-02 NOTE — Assessment & Plan Note (Signed)
[facility]                             PULMONARY OFFICE NOTE   Jaclyn [name], Jaclyn [name]                      MRN:          [id number]  DATE:01/12/2007                            DOB:          06/24/1935    HISTORY OF PRESENT ILLNESS:  The patient is a 75 year old, white female  patient of Dr. Jodelle Green who has a known history of hypertension,  hyperlipidemia, diabetes mellitus and hypothyroidism who presents today  for a four month routine office visit. The patient reports that she has  been doing very well since her last visit. She denies any chest pain,  shortness of breath, abdominal pain, or nausea or vomiting. The patient  is maintained on Glucovance 2.5/500 daily. The patient's last A1c was  not well controlled at 8.1. The patient's TSH was elevated at 22 and the  patient's Synthroid was increased to 88 mcg. The patient also was found  the have a low vitamin D level at 9. The patient has been recommended to  start on vitamin D supplement 50,000 units weekly. However, the patient  did not pick up her prescription as of yet.   PAST MEDICAL HISTORY:  Reviewed.   CURRENT MEDICATIONS:  Reviewed.   PHYSICAL EXAMINATION:  GENERAL:  The patient is a pleasant female in no  acute distress.  VITAL SIGNS:  She is afebrile with stable vital signs. O2 saturation  is  98% on room air  HEENT:  Unremarkable.  NECK:  Supple without cervical adenopathy. No JVD.  LUNGS:  Lung sounds are clear to auscultation bilaterally.  CARDIAC:  S1. S2 without murmurs, rubs or gallops.  ABDOMEN:  Soft and nontender, no palpable hepatosplenomegaly.  EXTREMITIES:  Warm without any edema.  NEUROLOGIC:  No focal deficits.   IMPRESSION/PLAN:  1. Diabetes mellitus. Will take a hemoglobin A1c today and will adjust      the medications accordingly. The patient is advised on diet and      exercise measures.  2. Hypothyroidism. Patient with recent change in Synthroid dose at 88  mcg daily. TSH was pending at the time of dictation.  3. Vitamin D deficiency. The patient will be begin vitamin D      supplement at 50,000 units weekly x3 months. Will recheck level.      The patient's recent bone density in July this year showed T scores      of a -0.8. The patient is recommended to continue on her calcium      with vitamin D supplement.  4. Hyperlipidemia. The patient is advised on diet and exercise. The      patient is not fasting today. Will recheck fasting lipid panel on      return in 3 months. The patient has been intolerant to statins in      the past. May need referral to lipid clinic if not at goal.  5. Health maintenance. The patient was given a Pneumovax and influenza      vaccine today. The patient will follow back up with Dr. Kriste Basque in 3      months or sooner  if needed.      [name], NP  Electronically Signed      [name], MD  Electronically Signed   TP/MedQ  DD: 01/12/2007  DT: 01/12/2007  Job #: [id number]

## 2010-07-02 NOTE — Assessment & Plan Note (Signed)
[facility]                            CARDIOLOGY OFFICE NOTE   STEPANIE, Jaclyn [name]                      MRN:          [id number]  DATE:01/04/2008                            DOB:          04/23/1935    HISTORY OF PRESENT ILLNESS:  Lexus returns today for followup.  I have  seen her for atypical chest pain in the past.  She had a normal cath  2008.  She has a right carotid bruit, which we follow with carotid  duplex on January 04, 2008.  She had 60-79% right ICA stenosis, 40-59%  left.  She has been maintained on aspirin.  She is not having any TIAs.  Her blood pressure is under reasonable control.  She has been intolerant  to statins in the past.  She needs a followup lipid profile with Dr.  Kriste Basque.  Unfortunately, her sugar continues to be poorly controlled.  She  has had hemoglobin A1c in the mid 7.  She does not watch her diet very  well.  She is now on glipizide, metformin, as well as a higher dosage of  Januvia.  I talked to her at length about this and how important it is  for her to tighten up on her diet.  She has not had any end-organ damage  yet.   REVIEW OF SYSTEMS:  Her review of systems otherwise remarkable for  recent encounter with someone at work.  She apparently lost her job at  [employer] and she said it was secondary to a man who had esophageal  cancer that she got involved with and then he turned somewhat crazy on  her.   MEDICATIONS:  She is on  1. Synthroid 100 mcg a day.  2. Mavik 2 mg a day.  3. Lexapro 20 a day.  4. Glyburide.  5. Metformin 25/500.  6. Januvia, dose not recorded.   PHYSICAL EXAMINATION:  GENERAL:  Remarkable for an animated white female  who looks younger than her stated age.  VITAL SIGNS:  Her weight is 155, blood pressure 136/68, pulse 69 and  regular, respiratory rate 14, afebrile.  HEENT:  Unremarkable.  NECK:  She has a right carotid bruit.  No lymphadenopathy, thyromegaly,  or JVP elevation.  LUNGS:  Clear.  Good diaphragmatic motion.  No wheezing.  S1 and S2.  Normal heart sounds.  PMI normal.  ABDOMEN:  Benign.  Bowel sounds positive.  No AAA, no tenderness, no  bruits, no hepatosplenomegaly, no hepatojugular reflux.  EXTREMITIES:  Distal pulses are intact.  No edema.  NEUROLOGIC:  Nonfocal.  SKIN:  Warm and dry.  MUSCULOSKELETAL:  No muscular weakness.   IMPRESSION:  1. Previous atypical chest pain, normal catheterization, not recurrent      likely musculoskeletal.  Continue baby aspirin a day.  2. Hypertension, currently well controlled.  Continue Mavik,      particularly in light of her diabetes.  3. Hypercholesterolemia.  Recheck per Dr. Kriste Basque, consider addition of      Niaspan or Zetia.  4. Diabetes, poorly controlled.  Continue to tighten up on her diet  with low carbohydrates.  Referred her to [facility] Diet.  The      patient will continue her current oral hypoglycemics.  5. Right carotid bruit, stable.  Followup duplex in a year.  Continue      aspirin.     Noralyn Pick. Eden Emms, MD, [facility]  Electronically Signed    PCN/MedQ  DD: 01/04/2008  DT: 01/04/2008  Job #: [id number]

## 2010-07-05 NOTE — Assessment & Plan Note (Signed)
[facility]                               PULMONARY OFFICE NOTE   Jaclyn [name], Jaclyn [name]                      MRN:          [id number]  DATE:12/30/2005                            DOB:          April 19, 1935    HISTORY OF PRESENT ILLNESS:  The patient is a 75 year old white female  patient of Dr. Jodelle Green, who has a known history of hypertension and diabetes  mellitus, presents for an acute office visit complaining of a cough, sore  throat and nasal congestion over the last week.  The patient has been using  over-the-counter products without any relief.  She denies any chest pain,  orthopnea, PND or leg swelling.   PHYSICAL. EXAMINATION:  The patient is a pleasant white female in no acute  distress.  She is afebrile with stable vital signs.  HEENT:  Nasal mucosa with some mild erythema.  Nontender sinuses to  percussion.  The posterior pharynx has some mild redness, no exudate noted,  TMs bilaterally are erythematous.  NECK:  Supple without adenopathy.  LUNGS:  Lung sounds are clear.  CARDIAC:  Regular rate.  ABDOMEN:  Soft and benign.  EXTREMITIES:  Warm without any edema.   IMPRESSION AND PLAN:  Acute upper respiratory infection and bilateral otitis  media.  The patient is to begin:  1. Omnicef x7 days.  2. Mucinex DM twice daily.  3. Magic mouthwash as needed for sore throat.      Jaclyn Oaks, NP  Electronically Signed      [name], MD  Electronically Signed   TP/MedQ  DD: 12/30/2005  DT: 12/30/2005  Job #: [id number]

## 2010-07-05 NOTE — Assessment & Plan Note (Signed)
[facility]                            CARDIOLOGY OFFICE NOTE   Jaclyn [name], Jaclyn [name]                      MRN:          [id number]  DATE:04/06/2006                            DOB:          06-09-1935    Jaclyn [name] returns today for followup.  She has had previous chest pain,  with a normal catheterization.  She has vascular disease, with moderate  ICA stenosis I believe on the left side.  She needs a followup duplex in  December 2008.  She has not had any recurrent chest pain.   She has not had any TIAs or CVAs.  Her biggest complaint today is  restless leg syndrome.   She is on Lexapro and Neurontin.   It is unclear to me whether she has both neuropathy and/or restless  legs.   REVIEW OF SYSTEMS:  Otherwise negative.   MEDICATIONS:  Are listed in the chart.  They include:  1. Mavik 2 mg a day.  2. Her digoxin has been stopped.  3. Lexapro 20 a day.  4. Iron 150 a day.  5. Neurontin 300 a day.  6. Glucovance 2.5/500, 2 tablets a day.  7. Synthroid 50 mcg a day.   PHYSICAL EXAMINATION:  GENERAL:  She looks well.  HEENT:  Normal.  VITAL SIGNS:  Blood pressure is 130/70, pulse is 70 and regular.  LUNGS:  Clear.  CARDIOVASCULAR:  She has a very faint right carotid bruit.  There is  normal S1, S2.  Normal heart sounds.  ABDOMEN:  Benign.  LOWER EXTREMITIES:  Intact pulses.  No edema.   IMPRESSION:  1. Bruit on the right hand side, with moderate carotid disease.      Follow up duplex in December 2008.  Continue baby aspirin a day.  2. No previous transient ischemic attacks or cerebrovascular      accidents.  Previous chest pain, with normal catheterization.      Continue risk factor modification.  3. The patient has restless leg syndrome.  The best medication for      this is probably Requip.  She may have had a starter pack before.      I would be a little hesitant to have her on this, Neurontin, and      Lexapro.  I will leave this decision  up to Dr. Kriste Basque.   I will see her back in December 2008 when she has her follow-up duplex.     Noralyn Pick. Eden Emms, MD, [facility]  Electronically Signed    PCN/MedQ  DD: 04/06/2006  DT: 04/07/2006  Job #: [id number]

## 2010-07-05 NOTE — Assessment & Plan Note (Signed)
[facility]                            CARDIOLOGY OFFICE NOTE   Jaclyn [name], Jaclyn [name]                      MRN:          [id number]  DATE:02/06/2006                            DOB:          25-Jan-1936    Jaclyn [name] is seen today in followup.  I last saw her in 2005.   She is a delightful lady with multiple coronary risk factors including  hypertension, hypercholesterolemia and diabetes.  She has known vascular  disease with a 60% to 80% right internal carotid artery stenosis, and a  40% to 60% left internal carotid artery stenosis by duplex on January 05, 2004.   When I last saw the patient, one of her primary problems had been  paroxysmal atrial fibrillation.   She does get occasional palpitations, but does not have any sustained  arrhythmias since I saw her.   We had cut back on her beta blocker during her last visit due to some  mental status changes.   Last year she had colon surgery by Dr. Jamey Ripa for a very large polyp,  and we had not had her on Coumadin fortunately.   I do not recall there being an issue about coronary artery disease in  the past; however, when I talk to her today she clearly has developed  exertional angina-type syndromes over the last 5 months.  She describes  it as a burning in her chest which is reproducible with exertion.   This is a new symptom for her, and somewhat worrisome.  I do not have a  stress test in the chart on her.   The patient's coronary risk factors as indicated are multiple, including  hypertension, hyperlipidemia and diabetes.  She tells me her hemoglobin  A1c has been in the 7 range.   MEDICATIONS:  1. Dig 0.125 mg a day.  2. Mavik 2 mg a day.  3. Lexapro 20 a day.  4. Iron.  5. Synthroid 100 mcg a day.  6. Neurontin.  7. Toprol 25 a day.   REVIEW OF SYSTEMS:  Otherwise benign.  Specifically she has not had any  recurrent GI bleeding or stomach problems since her partial colectomy.   PHYSICAL EXAMINATION:  She looks well.  The blood pressure is 130/70, pulse is 70 with occasional PACs.  She has a left carotid bruit.  LUNGS:  Clear.  There is an S1, S2 with a soft systolic murmur.  ABDOMEN:  Benign.  Distal pulses are intact with no edema.   Her EKG shows low atrial focus with poor R wave progression, with axis  deviation and nonspecific ST-T wave changes.   IMPRESSION:  Jaclyn [name] presents with several issues.  She has had  paroxysmal atrial fibrillation in the past and is stable.  She has a low  atrial focus on her EKG, and I may consider stopping her digoxin in the  future.  She has presumed normal left ventricular function and I am not  sure if the digoxin is causing her low atrial focus.  She clearly has  new symptoms of exertional angina and needs a heart  catheterization.  We  discussed the risks of this including stroke, need for emergency  surgery, bleeding and contrast reaction, and she is willing to proceed.   Her risk factors are fairly well modified.  We will continue her statin  drug.  Since there is a potential for 3-vessel disease I will not start  her on Plavix.  She will come back next week to have a repeat carotid  duplex to reassess her carotid disease prior to the catheterization so I  can see what her risk of CVA is.  At that time she will have her blood  work and chest x-ray.  Further recommendations will be based on the  results of her carotid duplex and heart catheterization.     Noralyn Pick. Eden Emms, MD, [facility]  Electronically Signed    PCN/MedQ  DD: 02/06/2006  DT: 02/07/2006  Job #: [id number]

## 2010-07-05 NOTE — Discharge Summary (Signed)
NAME:  Jaclyn [name], Jaclyn [name] NO.:  [id number]   MEDICAL RECORD NO.:  [id number]                   PATIENT TYPE:  INP   LOCATION:  0448                                 FACILITY:  [facility]   PHYSICIAN:  Currie Paris, M.D.           DATE OF BIRTH:  [birth year]   DATE OF ADMISSION:  07/27/2003  DATE OF DISCHARGE:  07/31/2003                                 DISCHARGE SUMMARY   CLINICAL HISTORY:  Ms. Harada is a 75 year old lady who presented with a  large cecal mass thought to be a villous adenoma.  It was too large to be  removed colonoscopically.   HOSPITAL COURSE:  The patient was admitted and taken to the operating room  on June 9th where a right colectomy was done.  She tolerated the procedure  well.  She did pass some blood following surgery and it was questionable  whether this was related to her low-dose heparin for DVT prophylaxis but  that was stopped and hemoglobin stabilized although it did drop a few points  from surgery.  By June 12th, she had more regular stools, CBC had remained  stable and by June 13th she was tolerating a diet and able to be discharged.  She was feeling fine and abdomen was benign, wound was healing nicely.   She was discharged to resume her usual home meds, Tylox for pain, and to  follow up in my office in approximately a week.   Laboratory studies included a preop hemoglobin of 12.7 with a predischarge  hemoglobin of 9.4.  ____________ was negative.  Chemistries were normal.  Chest x-ray showed some biapical pleural parenchymal densities most likely  scarring.  Pathology report showed a cecal tubovillous adenoma with no high-  grade dysplasia or invasive malignancy.                                               Currie Paris, M.D.    CJS/MEDQ  D:  08/12/2003  T:  08/12/2003  Job:  [id number]   cc:   [name], M.D. [facility]   Tasia Catchings, M.D.  [address]  [place]  [place] [address]  Fax:  [phone]   Charlton Haws, M.D.

## 2010-07-05 NOTE — Cardiovascular Report (Signed)
NAME:  Jaclyn [name], Jaclyn [name] NO.:  [id number]   MEDICAL RECORD NO.:  [id number]          PATIENT TYPE:  OIB   LOCATION:  1966                         FACILITY:  [facility]   PHYSICIAN:  Peter C. Eden Emms, MD, FACCDATE OF BIRTH:  1935-04-20   DATE OF PROCEDURE:  02/19/2006  DATE OF DISCHARGE:                            CARDIAC CATHETERIZATION   PROCEDURE:  Coronary arteriography.   INDICATIONS:  A 70-year patient with known vascular disease, moderate  carotid stenoses with exertional chest pain, rule out coronary artery  disease.   Left heart catheterization was performed using  4-French catheters from  right femoral artery.   Left main coronary was normal.   Left anterior descending artery was a medium-sized vessel.  It did reach  the apex.  It was normal.  There were two diagonal branches.  The first  was a large branching diagonal branch.  The second was much smaller.  There was an intermediate branch of medium size which was normal.  The  circumflex coronary artery was nondominant and normal.   The right coronary artery was somewhat small, but dominant.  It was  normal.   RAO ventriculography:  RAO ventriculography was normal.  EF was 60%.  There was no gradient across the aortic valve and no MR.   Aortic pressure was 138/62, LV pressure is 138/17.   IMPRESSION:  Despite having evidence of vascular disease in her carotids  and exertional chest pain the patient has no significant coronary artery  disease.  We will continue to treat her risk factors and follow her for  paroxysmal atrial fibrillation.  She tolerated the procedure well.      Noralyn Pick. Eden Emms, MD, [name]  Electronically Signed     PCN/MEDQ  D:  02/19/2006  T:  02/19/2006  Job:  [id number]

## 2010-07-05 NOTE — Op Note (Signed)
[name], Jaclyn [name] NO.:  [id number]   MEDICAL RECORD NO.:  [id number]                   PATIENT TYPE:  INP   LOCATION:  E454                                 FACILITY:  [facility]   PHYSICIAN:  Currie Paris, M.D.           DATE OF BIRTH:  1935/04/29   DATE OF PROCEDURE:  07/27/2003  DATE OF DISCHARGE:                                 OPERATIVE REPORT   PREOPERATIVE DIAGNOSIS:  Cecal villous adenoma.   POSTOPERATIVE DIAGNOSIS:  Cecal villous adenoma.   OPERATION/PROCEDURE:  Right colectomy with primary anastomosis.   SURGEON:  Currie Paris, M.D.   ASSISTANT:  Anselm Pancoast. Zachery Dakins, M.D.   INDICATIONS:  The patient is 70 and found to have a villous adenoma of the  cecal area that was too large to be colonoscopically removed.  Biopsy showed  it to be benign.   DESCRIPTION OF PROCEDURE:  The patient was seen in the holding area, had no  further questions. She was taken to the operating room and satisfactory  general endotracheal anesthesia was obtained.  She was a very difficult  intubation but this was accomplished satisfactorily.  After the patient was  anesthetized, Foley catheter was placed and the abdomen prepped and draped.  I made a short right lower quadrant incision just below the level of the  umbilicus.  Skin and subcutaneous tissues were divided and the fascia  divided with cautery.  Muscle was likewise divided and the peritoneum picked  up and entered.  The cecum was fairly mobile and freed up some of its  attachments to right gutter area so that we had it well up as well as the  terminal ileum.  Small mass could be palpated in the cecum in the area  described by Dr. Sherin Quarry.   I scored the mesentery with the cautery and then divided the small bowel  mesentery towards the base somewhat and then back up towards the mid  ascending colon where we had selected an area for division of bowel.  Once  the mesentery was divided, it  was tied with 2-0 silks or suture ligatures of  2-0 silk.   The small bowel was tacked to the colon and the area to be resected and the  antimesenteric borders of each lined up.  The small bowel and colon was  opened and the GIA stapler inserted.  It was closed and fired, producing a  nice stapled anastomosis.  That was checked and there was no bleeding.   The common defect was closed with the TA-60 stapler and the specimen cut off  the stapler.  We appeared to have a nice patent anastomosis.  Specimen was  opened on the back table and did contain a polypoid lesion as described by  Dr. Sherin Quarry in the location described by him.   Gloves and instruments were changed.  The mesenteric defect was closed with  some 3-0 silks.  A  crotch stitch was placed at the end of the anastomosis  and the colon placed in its anatomic position.  A brief bimanual exam  revealed  no gross abnormalities.  The abdomen was irrigated and closed.  I used 0 PDS  on the posterior sheath, a #1 PDS on the anterior sheath and staples to the  skin.  The wound was irrigated __________ layered closure.   The patient tolerated the procedure well.  There were no operative  complications.  All counts were correct.                                               Currie Paris, M.D.    CJS/MEDQ  D:  07/27/2003  T:  07/27/2003  Job:  [id number]   cc:   [name], M.D. [facility]   Tasia Catchings, M.D.  [address]  [place]  [place] [address]  Fax: [phone]

## 2010-07-18 ENCOUNTER — Telehealth: Payer: Self-pay | Admitting: Cardiovascular Disease

## 2010-07-18 NOTE — Telephone Encounter (Signed)
RN reviewed chart, Dr Eden Emms would like for Pt to have Carotid in June (6 months from 12/11). Pt has appt on 08/26/2010 with Dr Eden Emms. RN left message to call back.

## 2010-07-18 NOTE — Telephone Encounter (Signed)
Pt wants to know if she needs a carotid test before seeing dr Eden Emms in July. Pt states dr Eden Emms told her that she needs a carotid test every six months

## 2010-07-19 NOTE — Telephone Encounter (Signed)
I attempted to call the patient today. Her voice mail is full and I could not leave a message.

## 2010-07-22 NOTE — Telephone Encounter (Signed)
Will forward to Debra Mathis-RN 

## 2010-07-24 NOTE — Telephone Encounter (Signed)
Unable to reach pt or leave a message Jaclyn [name]  

## 2010-07-31 NOTE — Telephone Encounter (Signed)
Unable to reach pt or leave a message Jaclyn [name]

## 2010-08-23 ENCOUNTER — Encounter: Payer: Self-pay | Admitting: Cardiovascular Disease

## 2010-08-26 ENCOUNTER — Telehealth: Payer: Self-pay | Admitting: Cardiovascular Disease

## 2010-08-26 ENCOUNTER — Ambulatory Visit: Payer: Medicare Other | Admitting: Cardiovascular Disease

## 2010-08-26 NOTE — Telephone Encounter (Signed)
Pt cxl appt today, woke up with diarrhea, wants to know if dr Eden Emms wants her to have a carotid before she come back , said he usually does one before her appt

## 2010-08-26 NOTE — Telephone Encounter (Signed)
Left message for pt that last carotids were done in dec 2011 and they do not need to be repeated for one year Jaclyn [name]

## 2010-09-16 ENCOUNTER — Ambulatory Visit: Payer: Medicare Other | Admitting: Cardiovascular Disease

## 2010-10-07 ENCOUNTER — Ambulatory Visit: Payer: Medicare Other | Admitting: Cardiovascular Disease

## 2010-10-14 ENCOUNTER — Encounter: Payer: Self-pay | Admitting: Cardiovascular Disease

## 2010-10-14 ENCOUNTER — Ambulatory Visit (INDEPENDENT_AMBULATORY_CARE_PROVIDER_SITE_OTHER): Payer: Medicare Other | Admitting: Cardiovascular Disease

## 2010-10-14 DIAGNOSIS — E785 Hyperlipidemia, unspecified: Secondary | ICD-10-CM

## 2010-10-14 DIAGNOSIS — R0989 Other specified symptoms and signs involving the circulatory and respiratory systems: Secondary | ICD-10-CM

## 2010-10-14 DIAGNOSIS — I63239 Cerebral infarction due to unspecified occlusion or stenosis of unspecified carotid arteries: Secondary | ICD-10-CM | POA: Insufficient documentation

## 2010-10-14 DIAGNOSIS — I10 Essential (primary) hypertension: Secondary | ICD-10-CM

## 2010-10-14 DIAGNOSIS — E119 Type 2 diabetes mellitus without complications: Secondary | ICD-10-CM

## 2010-10-14 NOTE — Assessment & Plan Note (Signed)
Well controlled.  Continue current medications and low sodium Dash type diet.    

## 2010-10-14 NOTE — Progress Notes (Signed)
Jaclyn [name] is seen today fo rf/U of HTN, elevated lipids previous atypical SSCP and right carotid bruit. She's had a normal cath in 2008. She has known RICA dx. I reviewed her duplex from 12/11  and she has stable 60-79% RICA stenosis (157/44), and 40-59% LICA stenosis. She has had no recurrent SSCP, no TIA like symptoms. She has been compliant with her meds. She is intolerant to statins. She needs to have her cholesterol checked at Dr. Kirstie Mirza. Othewise she is doing well  Some fibromyalgia and neuropatthy.  No history of PVC or claudication.     ROS: Denies fever, malais, weight loss, blurry vision, decreased visual acuity, cough, sputum, SOB, hemoptysis, pleuritic pain, palpitaitons, heartburn, abdominal pain, melena, lower extremity edema, claudication, or rash.  All other systems reviewed and negative  General: Affect appropriate Healthy:  appears stated age HEENT: normal Neck supple with no adenopathy JVP normal Right  bruits no thyromegaly Lungs clear with no wheezing and good diaphragmatic motion Heart:  S1/S2 no murmur,rub, gallop or click PMI normal Abdomen: benighn, BS positve, no tenderness, no AAA no bruit.  No HSM or HJR Distal pulses intact with no bruits No edema Neuro non-focal Skin warm and dry No muscular weakness   Current Outpatient Prescriptions  Medication Sig Dispense Refill  . B Complex Vitamins (VITAMIN-B COMPLEX PO) Take 1 tablet by mouth daily.        . cholecalciferol (VITAMIN D) 1000 UNITS tablet Take 2 Units by mouth daily.        Marland Kitchen escitalopram (LEXAPRO) 20 MG tablet Take 20 mg by mouth daily.        Marland Kitchen ezetimibe (ZETIA) 10 MG tablet Take 10 mg by mouth daily.        Marland Kitchen glucose blood test strip 1 each by Other route as needed. Use as instructed check blood sugars once daily       . glyBURIDE-metformin (GLUCOVANCE) 5-500 MG per tablet Take 2 tablets by mouth in the morning and 1 tablet by mouth at dinner  90 tablet  11  . levothyroxine (SYNTHROID, LEVOTHROID)  100 MCG tablet Take 100 mcg by mouth daily.        Marland Kitchen oxaprozin (DAYPRO) 600 MG tablet Take 1,200 mg by mouth. 1 once daily as needed joint pain with food.       . sitaGLIPtan (JANUVIA) 100 MG tablet Take 100 mg by mouth daily.        . trandolapril (MAVIK) 2 MG tablet Take 2 mg by mouth daily.        . vitamin B-12 (CYANOCOBALAMIN) 100 MCG tablet Take 50 mcg by mouth daily.          Allergies  Sulfonamide derivatives  Electrocardiogram:  Assessment and Plan

## 2010-10-14 NOTE — Assessment & Plan Note (Signed)
F/U labs [place].  Continue Zetia  Intolerant to statins

## 2010-10-14 NOTE — Patient Instructions (Signed)
Your physician wants you to follow-up in: ONE YEAR You will receive a reminder letter in the mail two months in advance. If you don't receive a letter, please call our office to schedule the follow-up appointment.   Your physician has requested that you have a carotid duplex. This test is an ultrasound of the carotid arteries in your neck. It looks at blood flow through these arteries that supply the brain with blood. Allow one hour for this exam. There are no restrictions or special instructions. SCHEDULE IN [place]

## 2010-10-14 NOTE — Assessment & Plan Note (Signed)
F/U [place] target [facility] with vascular disease is 6.5 or less

## 2010-10-14 NOTE — Assessment & Plan Note (Signed)
F/U duplex in December.  60-79% LICA stenosis, 40-59% RICA.

## 2010-12-16 ENCOUNTER — Ambulatory Visit: Payer: Medicare Other | Admitting: Adult Health

## 2010-12-20 ENCOUNTER — Ambulatory Visit (INDEPENDENT_AMBULATORY_CARE_PROVIDER_SITE_OTHER): Payer: Medicare Other | Admitting: Adult Health

## 2010-12-20 ENCOUNTER — Encounter: Payer: Self-pay | Admitting: Adult Health

## 2010-12-20 ENCOUNTER — Other Ambulatory Visit (INDEPENDENT_AMBULATORY_CARE_PROVIDER_SITE_OTHER): Payer: Medicare Other

## 2010-12-20 VITALS — BP 112/64 | HR 74 | Temp 97.0°F | Ht 63.0 in | Wt 153.0 lb

## 2010-12-20 DIAGNOSIS — N390 Urinary tract infection, site not specified: Secondary | ICD-10-CM

## 2010-12-20 LAB — URINALYSIS, ROUTINE W REFLEX MICROSCOPIC
Specific Gravity, Urine: >=1.03 (ref 1.000–1.030)
Total Protein, Urine: NEGATIVE
Urine Glucose: NEGATIVE
Urobilinogen, UA: 0.2 (ref 0.0–1.0)

## 2010-12-20 MED ORDER — CIPROFLOXACIN HCL 500 MG PO TABS: 500.0000 mg | ORAL_TABLET | Freq: Two times a day (BID) | ORAL | Status: AC

## 2010-12-20 MED ORDER — OXAPROZIN 600 MG PO TABS: ORAL_TABLET | ORAL | Status: AC

## 2010-12-20 NOTE — Progress Notes (Signed)
  Subjective:    Patient ID: Jaclyn [name], female    DOB: 08/08/1935, 75 y.o.   MRN: [id number]  HPI 75 yo WF   12/20/2010 Acute OV  Pt complains of pain in lower abdomen/vaginal area when she has to urinate, does not hurt when she she actually urinates. But after at times. Last UTI was 6 months ago. Seen by Dr. Vernie Ammons in urology at that time. NO recent travel or abx use. No back pain , fever or n/v.    Review of Systems Constitutional:   No  weight loss, night sweats,  Fevers, chills, fatigue, or  lassitude.  HEENT:   No headaches,  Difficulty swallowing,  Tooth/dental problems, or  Sore throat,                No sneezing, itching, ear ache, nasal congestion, post nasal drip,   CV:  No chest pain,  Orthopnea, PND, swelling in lower extremities, anasarca, dizziness, palpitations, syncope.   GI  No heartburn, indigestion,  vomiting, diarrhea, change in bowel habits, loss of appetite, bloody stools.   Resp: No shortness of breath with exertion or at rest.  No excess mucus, no productive cough,  No non-productive cough,  No coughing up of blood.  No change in color of mucus.  No wheezing.  No chest wall deformity  Skin: no rash or lesions.  GU:++ dysuria,   ++ urgency or frequency.  No flank pain, no hematuria   MS:  No joint pain or swelling.  No decreased range of motion.     Psych:  No change in mood or affect. No depression or anxiety.  No memory loss.         Objective:   Physical Exam GEN: A/Ox3; pleasant , NAD, well nourished   HEENT:  [place]/AT,  EACs-clear, TMs-wnl, NOSE-clear, THROAT-clear, no lesions, no postnasal drip or exudate noted.   NECK:  Supple w/ fair ROM; no JVD; normal carotid impulses w/o bruits; no thyromegaly or nodules palpated; no lymphadenopathy.  RESP  Clear  P & A; w/o, wheezes/ rales/ or rhonchi.no accessory muscle use, no dullness to percussion  CARD:  RRR, no m/r/g  , no peripheral edema, pulses intact, no cyanosis or clubbing.  GI:   Soft &  nt; nml bowel sounds; no organomegaly or masses detected., neg CVA tenderness  Musco: Warm bil, no deformities or joint swelling noted.   Neuro: alert, no focal deficits noted.    Skin: Warm, no lesions or rashes         Assessment & Plan:

## 2010-12-20 NOTE — Assessment & Plan Note (Signed)
Acute UTI   Plan :  Cipro 500mg  Twice daily  For 7 days  Increase fluids  Urinate frequently , and empty bladder well. Cotton underwear.  Please contact office for sooner follow up if symptoms do not improve or worsen or seek emergency care

## 2010-12-20 NOTE — Patient Instructions (Signed)
Cipro 500mg  Twice daily  For 7 days  Increase fluids  Urinate frequently , and empty bladder well. Cotton underwear.  Please contact office for sooner follow up if symptoms do not improve or worsen or seek emergency care

## 2010-12-20 NOTE — Progress Notes (Signed)
Addended by: Tommie Sams on: 12/20/2010 04:33 PM   Modules accepted: Orders

## 2010-12-24 LAB — URINE CULTURE: Colony Count: >=100000

## 2010-12-25 NOTE — Progress Notes (Signed)
Quick Note:  ATC NA unable to leave message - Will try again later ______

## 2010-12-30 ENCOUNTER — Other Ambulatory Visit: Payer: Self-pay | Admitting: Pulmonary Disease

## 2011-01-20 ENCOUNTER — Telehealth: Payer: Self-pay | Admitting: Adult Health

## 2011-01-20 DIAGNOSIS — R300 Dysuria: Secondary | ICD-10-CM

## 2011-01-20 NOTE — Telephone Encounter (Signed)
Spoke with pt and notified of recs per TP. Pt verbalized understanding. States that she is unable to come in for appt or leave urine sample until Friday 12/7. She wants to know if there is anything in the meantime that can be done. Aware will not receive a call back until tomorrow. Please advise, thanks!

## 2011-01-20 NOTE — Telephone Encounter (Signed)
Can leave another urine if she would like or can see in HP tomorrow

## 2011-01-20 NOTE — Telephone Encounter (Signed)
I spoke with pt and she states she still has a bladder infection. Pt c/o lower back pain, frequent urination, burning/pain when urinates. Pt is requesting something called in for her. Please advise Tammy, thanks  Allergies  Allergen Reactions  . Sulfonamide Derivatives

## 2011-01-21 MED ORDER — CIPROFLOXACIN HCL 250 MG PO TABS: 250.0000 mg | ORAL_TABLET | Freq: Two times a day (BID) | ORAL | Status: AC

## 2011-01-21 NOTE — Telephone Encounter (Signed)
Can use cipro 250mg  Twice daily  #14, no refills  After she finishes abx, recheck ua/urine cx to make sure cleared  Please contact office for sooner follow up if symptoms do not improve or worsen or seek emergency care

## 2011-01-21 NOTE — Telephone Encounter (Signed)
Spoke with pt and advised that rx for Cipro was sent to [facility] on [address] and ov scheduled with TP to recheck and urine and get flu shot.

## 2011-01-30 ENCOUNTER — Ambulatory Visit: Payer: Medicare Other | Admitting: Adult Health

## 2011-02-03 ENCOUNTER — Ambulatory Visit (INDEPENDENT_AMBULATORY_CARE_PROVIDER_SITE_OTHER): Payer: Medicare Other | Admitting: Adult Health

## 2011-02-03 ENCOUNTER — Encounter: Payer: Self-pay | Admitting: Adult Health

## 2011-02-03 ENCOUNTER — Other Ambulatory Visit (INDEPENDENT_AMBULATORY_CARE_PROVIDER_SITE_OTHER): Payer: Medicare Other

## 2011-02-03 DIAGNOSIS — N390 Urinary tract infection, site not specified: Secondary | ICD-10-CM

## 2011-02-03 DIAGNOSIS — Z23 Encounter for immunization: Secondary | ICD-10-CM

## 2011-02-03 DIAGNOSIS — R300 Dysuria: Secondary | ICD-10-CM

## 2011-02-03 LAB — URINALYSIS
Bilirubin Urine: NEGATIVE
Ketones, ur: NEGATIVE
Leukocytes, UA: NEGATIVE
Total Protein, Urine: NEGATIVE
pH: 5.5 (ref 5.0–8.0)

## 2011-02-03 NOTE — Progress Notes (Signed)
Addended by: Fenton Foy on: 02/03/2011 05:18 PM   Modules accepted: Orders

## 2011-02-03 NOTE — Assessment & Plan Note (Signed)
Resolved  UA today is clear.  Advised on urinary hygiene measures follow up as planned and As needed

## 2011-02-03 NOTE — Patient Instructions (Signed)
Continue on current regimen.  Follow up with Dr. Kriste Basque  In 3 months and As needed

## 2011-02-03 NOTE — Progress Notes (Signed)
Subjective:    Patient ID: Jaclyn [name], female    DOB: 08-05-1935, 75 y.o.   MRN: [id number]  HPI  75 y/o WF with known hx of  multiple medical problems as noted below- followed for HBP, PAF, Carotid disease, Hyperchol, DM, Hypothy, FM, Vit D defic, anxiety/ depression... she is also followed by Walker Kehr for Cardiology, and DrWeissman for GI.Marland Kitchen.  ~  February 15, 2010:  19 mo ROV- doing fair> she's had repeated bladder infections & saw DrOttelin 9/11- Cipro w/o help but she notes that Doxy worked well... BP controlled on Mavik but she notes that the generic "depresses me" but she doesn't want to change meds- just wants the non-generic rx...  she had f/u Cards 12/11- DrNishan, no signif CAD, atypCP, f/u CDopplers w/ mod plaque & bilat ICA stenoses- stable...  she has been trying to control Lipids w/ diet alone but unsuccessful & she wants Zetia trial... BS poorly controlled but she is not on diet & needs to incr meds (see below)... she notes heartburn but states that daily "mustard" really helps...  we discussed diet + exercise & need for more regular f/u visits.  ~  June 10, 2010:  64mo ROV & stable, just incr stress w/ 30 y/o daughter's alcoholism and health issues;  She had labs done last week and FLP improved on the Zetia, but BS/ A1c about the same (on Glucovance & Januvia);  BP controlled & she denies CP/ palpit, etc > see prob list below>  02/03/2011 Follow up  Pt returns for follow up of UTI. Seen 1 month ago for UTI . Tx w/ Cipro. She returns today for follow up and check to make sure this has cleared completely. UA today is clear. She has no dysuria or discharge.  No fever or back pain.          Problem List:  HYPERTENSION (ICD-401.9) - on MAVIK 2mg /d (she requests the non-generic Mavik because the generic "depresses me")...   ~  12/11:  BP today= 118/74 & feeling well, takes med regularly and tolerates well... denies visual changes, CP, palipit, dizziness, syncope, dyspnea, edema,  etc...  ~  4/12:  BP= 134/64 & she continues to be essent asymptomatic...  CHEST PAIN, ATYPICAL (ICD-786.59) - she had cath for atyp CP 1/08 showing no signif CAD, EF= 60%... prev CWP part of her FM complex...  PAROXYSMAL ATRIAL FIBRILLATION (ICD-427.31) - followed by Walker Kehr & his notes are reviewed... she is holding NSR without palpit or arrhythmias noted... ~  2DEcho 5/05 was normal- no valve dis, normal wall thickness, norm EF...  CEREBROVASCULAR DISEASE (ICD-437.9) - on ASA 81mg /d... she has a right carotid bruit and bilat carotid stenoses> denies cerebral ischemic symptoms... ~  CDopplers 11/09 showed mod plaque in the prox ICAs bilat R>L, 60-79%R & 40-59%L, stable- no change going back to 2005 studies. ~  5/10:  reminded to take her ASA daily. ~  f/u CDopplers 12/11 showed mod plaque, stable bilat carotid dis w/ 60-79% RICA & 40-59% LICA stenoses.  HYPERLIPIDEMIA (ICD-272.4) - on Zetia 10mg  +diet... she has refused Statin Rx- "It makes my bones hurt so bad"> [facility] but states she didn't tol Crestor5 "I hurt so bad- it's diff than my FM" & discomfort resolved off Rx... then tried FISH OIL & titrated up to 3/d but her f/u FLP was no better... ~  FLP 12/07 showed TChol 192, TG 172, HDL 38, LDL 120... on diet alone... pt refuses Statin Rx. ~  FLP 9/09 showed TChol 230, TG 203, HDL 38, LDL 176... rec- [facility] ~  FLP 5/10 on FishOil showed TChol 231, TG 145, HDL 41, LDL 169 ~  FLP 12/11 showed TChol 244, TG 278, HDL 42, LDL 176...  try ZETIA 10mg /d. ~  FLP 4/12 on Zetia10 showed TChol 170, TG 175, HDL 41, LDL 94... Continue Zetia & diet...  DIABETES MELLITUS (ICD-250.00) - now on GLUCOVANCE 5-500 Bid and JANUVIA 100mg /d... ~  labs 2/09 showed BS= 180, HgA1c= 7.8.Marland KitchenMarland Kitchen on Glucov 2.5-500 taking 1 daily. ~  labs 9/09 showed BS= 219, HgA1c= 7.4... she ignored advise to incr to Bid. ~  discussed w/ pt 11/09: incr to Glucovance 5/500 Bid & Januvia 100mg /d... ~  labs  5/10 still on one of each (wt down 7# to 148#) showed BS= 167, A1c= 6.9 ~  labs 12/11 showed BS= 162, A1c= 8.6.Marland KitchenMarland Kitchen rec to incr Glucov5/500 Bid + Januv100/d. ~  Labs 4/12 showed BS=160, A1c=8.4.Marland KitchenMarland Kitchen rec incr to Glucov5/500- 2AM, 1PM; +Januv100/d.  HYPOTHYROIDISM (ICD-244.9) - now on SYNTHROID 128mcg/d...  ~  labs 11/08 showed TSH= 10.98... ?if taking med regularly? ~  labs 9/09 showed TSH= 0.63... rec- continue same dose. ~  labs 5/10 showed TSH= 0.49 ~  labs 12/11 showed TSH= 3.21  DIVERTICULOSIS OF COLON (ICD-562.10) & COLONIC POLYPS (ICD-211.3) - followed by DrWeissman... last colonoscopy 3/05 w divertics and cecal villous adenoma- referred to DrStreck w/ right colectomy 6/05 (no cancer in specimen).  SEBACEOUS CYST, BREAST (ICD-610.8) - infected seb cyst left chest wall treated in 2009 w/ Keflex & resolved.  DEGENERATIVE JOINT DISEASE (ICD-715.90) - Eval and Rx by DrRamos in 2007... calcif tendonitis in shoulders, DDD in CSpine... given Naprosyn & phys therapy... currently taking DAYPRO 600mg /d Prn...  FIBROMYALGIA (ICD-729.1) - on Daypro as noted... not interested in Lyrica/ Savella/ Rheum consult... ~  She has chronic pain & trigger points assoc w/ the FM...  VITAMIN D DEFICIENCY (ICD-268.9) - she has had a very low Vit D level = 9 on 7/08 & 9/09... she was perscribed Vit D 50,000 u weekly but apparently never took this medication... we wrote it for her again w/ instructions to take it weekly until we tell her to stop, but she only took it for 4 weeks then stopped on her own in favor of 1000 u daily OTC supplement... ~  BMD 7/08 was WNL-  TScore in Spine= +0.6,  TScore in Hips= -0.8.Marland Kitchen. ~   f/u Vit D level 5/10= 29, therefore continue the Vit D 1000/d. ~  F/u Vit D level 4/12 = 29 & she admits not taking the OTC supplement... rec incr to 2000u daily.  BORDERLINE B 12 level > Vit B12 level 5/10 = 267 (Hg= 12.6, MCV= 89)... rec to start Vit B12 supplement 100-500 micrograms/d w/ f/u level on  return...  ANXIETY DEPRESSION (ICD-300.4) - on LEXAPRO 20mg /d & notes that this really helps... INSOMNIA (ICD-780.52)   Past Surgical History  Procedure Date  . Vesicovaginal fistula closure w/ tah 1990    w/ cystocele &retocele repairs Dr. Elana Alm  . Right colectomy 2005    for villous adenoma of the cecum Dr.streck  . Cardiac catheterization 02/19/06    EF 60%    Outpatient Encounter Prescriptions as of 02/03/2011  Medication Sig Dispense Refill  . B Complex Vitamins (VITAMIN-B COMPLEX PO) Take 1 tablet by mouth daily.        . cholecalciferol (VITAMIN D) 1000 UNITS tablet Take 2 Units by mouth daily.        Marland Kitchen  escitalopram (LEXAPRO) 20 MG tablet TAKE 1 TABLET BY MOUTH ONCE A DAY  30 tablet  6  . ezetimibe (ZETIA) 10 MG tablet Take 10 mg by mouth daily.        Marland Kitchen glucose blood test strip 1 each by Other route as needed. Use as instructed check blood sugars once daily       . glyBURIDE-metformin (GLUCOVANCE) 5-500 MG per tablet Take 2 tablets by mouth in the morning and 1 tablet by mouth at dinner  90 tablet  11  . levothyroxine (SYNTHROID, LEVOTHROID) 100 MCG tablet Take 100 mcg by mouth daily.        Marland Kitchen oxaprozin (DAYPRO) 600 MG tablet 1 once daily as needed for joint pain take with food.  30 tablet  5  . sitaGLIPtan (JANUVIA) 100 MG tablet Take 100 mg by mouth daily.        . trandolapril (MAVIK) 2 MG tablet Take 2 mg by mouth daily.        . vitamin B-12 (CYANOCOBALAMIN) 100 MCG tablet Take 50 mcg by mouth daily.          Allergies  Allergen Reactions  . Sulfonamide Derivatives     Review of Systems          Constitutional:   No  weight loss, night sweats,  Fevers, chills, fatigue, or  lassitude.  HEENT:   No headaches,  Difficulty swallowing,  Tooth/dental problems, or  Sore throat,                No sneezing, itching, ear ache, nasal congestion, post nasal drip,   CV:  No chest pain,  Orthopnea, PND, swelling in lower extremities, anasarca, dizziness, palpitations,  syncope.   GI  No heartburn, indigestion, abdominal pain, nausea, vomiting, diarrhea, change in bowel habits, loss of appetite, bloody stools.   Resp: No shortness of breath with exertion or at rest.  No excess mucus, no productive cough,  No non-productive cough,  No coughing up of blood.  No change in color of mucus.  No wheezing.  No chest wall deformity  Skin: no rash or lesions.  GU: no dysuria, change in color of urine, no urgency or frequency.  No flank pain, no hematuria   MS:  No joint pain or swelling.  No decreased range of motion.     Psych:  No change in mood or affect. No depression or anxiety.  No memory loss.         Objective:   Physical Exam       WD, WN, 75  y/o WF in NAD... GENERAL:  Alert & oriented; pleasant & cooperative... HEENT:  [place]/AT, EOM-wnl, PERRLA, EACs-clear, TMs-wnl, NOSE-clear, THROAT-clear & wnl. NECK:  Supple w/ fairROM; no JVD; normal carotid impulses w/o bruits; no thyromegaly or nodules palpated; no lymphadenopathy. CHEST:  Clear to P & A; without wheezes/ rales/ or rhonchi. HEART:  Regular Rhythm; without murmurs/ rubs/ or gallops. ABDOMEN:  Soft & nontender; normal bowel sounds; no organomegaly or masses detected, no guarding or rebound, neg CVA tenderness  EXT: without deformities, mild arthritic changes; no varicose veins/ venous insuffic/ or edema. NEURO:   gait normal & balance OK. DERM:  No lesions noted; no rash [email or web address]   Assessment & Plan:

## 2011-02-05 LAB — URINE CULTURE

## 2011-02-27 ENCOUNTER — Other Ambulatory Visit: Payer: Self-pay | Admitting: Gynecology

## 2011-02-27 DIAGNOSIS — N644 Mastodynia: Secondary | ICD-10-CM

## 2011-03-03 ENCOUNTER — Telehealth: Payer: Self-pay | Admitting: Pulmonary Disease

## 2011-03-03 MED ORDER — QUINAPRIL HCL 20 MG PO TABS: 20.0000 mg | ORAL_TABLET | Freq: Every day | ORAL | Status: DC

## 2011-03-03 NOTE — Telephone Encounter (Signed)
SN please advise of an alternative of the mavik 2mg .  Thanks

## 2011-03-03 NOTE — Telephone Encounter (Signed)
Called and spoke with pt and she is aware per SN---ok to change the mavik 2mg  to quinapril 20mg  daily.  Pt is aware that this is the equivalent to the [facility].  Pt voiced her understanding of this.

## 2011-03-06 ENCOUNTER — Other Ambulatory Visit: Payer: Self-pay | Admitting: Pulmonary Disease

## 2011-03-26 ENCOUNTER — Other Ambulatory Visit: Payer: Self-pay | Admitting: Pulmonary Disease

## 2011-04-21 ENCOUNTER — Ambulatory Visit
Admission: RE | Admit: 2011-04-21 | Discharge: 2011-04-21 | Disposition: A | Discharge status: Home / Self Care | Payer: Medicare Other | Source: Ambulatory Visit | Attending: Gynecology | Admitting: Gynecology

## 2011-04-21 DIAGNOSIS — N644 Mastodynia: Secondary | ICD-10-CM

## 2011-05-05 ENCOUNTER — Ambulatory Visit: Payer: Medicare Other | Admitting: Pulmonary Disease

## 2011-06-02 ENCOUNTER — Other Ambulatory Visit (INDEPENDENT_AMBULATORY_CARE_PROVIDER_SITE_OTHER): Payer: Medicare Other

## 2011-06-02 ENCOUNTER — Other Ambulatory Visit: Payer: Self-pay | Admitting: Cardiology

## 2011-06-02 ENCOUNTER — Encounter: Payer: Self-pay | Admitting: Adult Health

## 2011-06-02 ENCOUNTER — Ambulatory Visit (INDEPENDENT_AMBULATORY_CARE_PROVIDER_SITE_OTHER): Payer: Medicare Other | Admitting: Adult Health

## 2011-06-02 VITALS — BP 126/74 | HR 82 | Temp 98.7°F | Ht 63.0 in | Wt 150.6 lb

## 2011-06-02 DIAGNOSIS — F341 Dysthymic disorder: Secondary | ICD-10-CM

## 2011-06-02 DIAGNOSIS — E538 Deficiency of other specified B group vitamins: Secondary | ICD-10-CM

## 2011-06-02 DIAGNOSIS — E119 Type 2 diabetes mellitus without complications: Secondary | ICD-10-CM

## 2011-06-02 DIAGNOSIS — I10 Essential (primary) hypertension: Secondary | ICD-10-CM

## 2011-06-02 DIAGNOSIS — E785 Hyperlipidemia, unspecified: Secondary | ICD-10-CM

## 2011-06-02 DIAGNOSIS — E559 Vitamin D deficiency, unspecified: Secondary | ICD-10-CM

## 2011-06-02 DIAGNOSIS — E039 Hypothyroidism, unspecified: Secondary | ICD-10-CM

## 2011-06-02 DIAGNOSIS — D126 Benign neoplasm of colon, unspecified: Secondary | ICD-10-CM

## 2011-06-02 DIAGNOSIS — I6529 Occlusion and stenosis of unspecified carotid artery: Secondary | ICD-10-CM

## 2011-06-02 DIAGNOSIS — Z23 Encounter for immunization: Secondary | ICD-10-CM

## 2011-06-02 LAB — CBC WITH DIFFERENTIAL/PLATELET
Basophils Relative: 0.4 % (ref 0.0–3.0)
Eosinophils Relative: 1.2 % (ref 0.0–5.0)
Lymphocytes Relative: 22 % (ref 12.0–46.0)
Neutrophils Relative %: 69.8 % (ref 43.0–77.0)
RBC: 4.04 Mil/uL (ref 3.87–5.11)
WBC: 7.1 10*3/uL (ref 4.5–10.5)

## 2011-06-02 LAB — HEPATIC FUNCTION PANEL
ALT: 46 U/L — ABNORMAL HIGH (ref 0–35)
AST: 60 U/L — ABNORMAL HIGH (ref 0–37)
Alkaline Phosphatase: 63 U/L (ref 39–117)
Total Bilirubin: 0.8 mg/dL (ref 0.3–1.2)

## 2011-06-02 LAB — BASIC METABOLIC PANEL
BUN: 16 mg/dL (ref 6–23)
Chloride: 98 mEq/L (ref 96–112)
Glucose, Bld: 281 mg/dL — ABNORMAL HIGH (ref 70–99)
Potassium: 4.5 mEq/L (ref 3.5–5.1)
Sodium: 139 mEq/L (ref 135–145)

## 2011-06-02 LAB — LDL CHOLESTEROL, DIRECT: Direct LDL: 212.6 mg/dL

## 2011-06-02 LAB — TSH: TSH: 54.73 u[IU]/mL — ABNORMAL HIGH (ref 0.35–5.50)

## 2011-06-02 LAB — LIPID PANEL: VLDL: 69 mg/dL — ABNORMAL HIGH (ref 0.0–40.0)

## 2011-06-02 LAB — HEMOGLOBIN A1C: Hgb A1c MFr Bld: 9.4 % — ABNORMAL HIGH (ref 4.6–6.5)

## 2011-06-02 LAB — VITAMIN B12: Vitamin B-12: 476 pg/mL (ref 211–911)

## 2011-06-02 NOTE — Progress Notes (Signed)
Subjective:    Patient ID: Jaclyn [name], female    DOB: September 25, 1935, 76 y.o.   MRN: [id number]  HPI  76 y/o WF with known hx of  multiple medical problems as noted below- followed for HBP, PAF, Carotid disease, Hyperchol, DM, Hypothy, FM, Vit D defic, anxiety/ depression... she is also followed by Walker Kehr for Cardiology, and DrWeissman for GI.Marland Kitchen.  ~  February 15, 2010:  19 mo ROV- doing fair> she's had repeated bladder infections & saw DrOttelin 9/11- Cipro w/o help but she notes that Doxy worked well... BP controlled on Mavik but she notes that the generic "depresses me" but she doesn't want to change meds- just wants the non-generic rx...  she had f/u Cards 12/11- DrNishan, no signif CAD, atypCP, f/u CDopplers w/ mod plaque & bilat ICA stenoses- stable...  she has been trying to control Lipids w/ diet alone but unsuccessful & she wants Zetia trial... BS poorly controlled but she is not on diet & needs to incr meds (see below)... she notes heartburn but states that daily "mustard" really helps...  we discussed diet + exercise & need for more regular f/u visits.  ~  June 10, 2010:  11mo ROV & stable, just incr stress w/ 64 y/o daughter's alcoholism and health issues;  She had labs done last week and FLP improved on the Zetia, but BS/ A1c about the same (on Glucovance & Januvia);  BP controlled & she denies CP/ palpit, etc > see prob list below>  02/03/12  Follow up  Pt returns for follow up of UTI. Seen 1 month ago for UTI . Tx w/ Cipro. She returns today for follow up and check to make sure this has cleared completely. UA today is clear. She has no dysuria or discharge.  No fever or back pain.  >no changes   06/02/2011 followup Patient returns for a followup office visit. She informs me that she has stopped her Zetia . He is due to leg and muscle pains. She feels that her muscle spasms and pain have much improved since stopping Zetia . We talked about a low cholesterol diet, exercise, and weight  goals. She is fasting today. We will check her fasting lipid panel.  She also has stopped her thyroid medication. When questioned the reason for stopping this medication. She said, because she wants to".  Overall she says that she has been doing well since last seen in the office. She does have occasional, what sounds like paresthesias in the legs.  She denies any chest pain, syncope, palpitations, abdominal pain, bloody stools, or leg swelling.  Health maintenance. She is due for a tetanus booster. We discussed that TDAP as she is taking care of her great grandchild now. She is also due for a followup colonoscopy. She continues to get her gynecological care with yearly Pap smears and routine mammograms. Her Pneumovax is up-to-date.        Problem List:  HYPERTENSION (ICD-401.9) - on MAVIK 2mg /d (she requests the non-generic Mavik because the generic "depresses me")...   ~  12/11:  BP today= 118/74 & feeling well, takes med regularly and tolerates well... denies visual changes, CP, palipit, dizziness, syncope, dyspnea, edema, etc...  ~  4/12:  BP= 134/64 & she continues to be essent asymptomatic...  CHEST PAIN, ATYPICAL (ICD-786.59) - she had cath for atyp CP 1/08 showing no signif CAD, EF= 60%... prev CWP part of her FM complex...  PAROXYSMAL ATRIAL FIBRILLATION (ICD-427.31) - followed by Walker Kehr & his notes  are reviewed... she is holding NSR without palpit or arrhythmias noted... ~  2DEcho 5/05 was normal- no valve dis, normal wall thickness, norm EF...  CEREBROVASCULAR DISEASE (ICD-437.9) - on ASA 81mg /d... she has a right carotid bruit and bilat carotid stenoses> denies cerebral ischemic symptoms... ~  CDopplers 11/09 showed mod plaque in the prox ICAs bilat R>L, 60-79%R & 40-59%L, stable- no change going back to 2005 studies. ~  5/10:  reminded to take her ASA daily. ~  f/u CDopplers 12/11 showed mod plaque, stable bilat carotid dis w/ 60-79% RICA & 40-59% LICA  stenoses.  HYPERLIPIDEMIA (ICD-272.4) - on Zetia 10mg  +diet... she has refused Statin Rx- "It makes my bones hurt so bad"> went [facility] but states she didn't tol Crestor5 "I hurt so bad- it's diff than my FM" & discomfort resolved off Rx... then tried FISH OIL & titrated up to 3/d but her f/u FLP was no better... ~  FLP 12/07 showed TChol 192, TG 172, HDL 38, LDL 120... on diet alone... pt refuses Statin Rx. ~  FLP 9/09 showed TChol 230, TG 203, HDL 38, LDL 176... rec- refer to [facility] ~  FLP 5/10 on FishOil showed TChol 231, TG 145, HDL 41, LDL 169 ~  FLP 12/11 showed TChol 244, TG 278, HDL 42, LDL 176...  try ZETIA 10mg /d. ~  FLP 4/12 on Zetia10 showed TChol 170, TG 175, HDL 41, LDL 94... Continue Zetia & diet...  DIABETES MELLITUS (ICD-250.00) - now on GLUCOVANCE 5-500 Bid and JANUVIA 100mg /d... ~  labs 2/09 showed BS= 180, HgA1c= 7.8.Marland KitchenMarland Kitchen on Glucov 2.5-500 taking 1 daily. ~  labs 9/09 showed BS= 219, HgA1c= 7.4... she ignored advise to incr to Bid. ~  discussed w/ pt 11/09: incr to Glucovance 5/500 Bid & Januvia 100mg /d... ~  labs 5/10 still on one of each (wt down 7# to 148#) showed BS= 167, A1c= 6.9 ~  labs 12/11 showed BS= 162, A1c= 8.6.Marland KitchenMarland Kitchen rec to incr Glucov5/500 Bid + Januv100/d. ~  Labs 4/12 showed BS=160, A1c=8.4.Marland KitchenMarland Kitchen rec incr to Glucov5/500- 2AM, 1PM; +Januv100/d.  HYPOTHYROIDISM (ICD-244.9) - now on SYNTHROID 158mcg/d...  ~  labs 11/08 showed TSH= 10.98... ?if taking med regularly? ~  labs 9/09 showed TSH= 0.63... rec- continue same dose. ~  labs 5/10 showed TSH= 0.49 ~  labs 12/11 showed TSH= 3.21  DIVERTICULOSIS OF COLON (ICD-562.10) & COLONIC POLYPS (ICD-211.3) - followed by DrWeissman... last colonoscopy 3/05 w divertics and cecal villous adenoma- referred to DrStreck w/ right colectomy 6/05 (no cancer in specimen).  SEBACEOUS CYST, BREAST (ICD-610.8) - infected seb cyst left chest wall treated in 2009 w/ Keflex & resolved.  DEGENERATIVE JOINT DISEASE  (ICD-715.90) - Eval and Rx by DrRamos in 2007... calcif tendonitis in shoulders, DDD in CSpine... given Naprosyn & phys therapy... currently taking DAYPRO 600mg /d Prn...  FIBROMYALGIA (ICD-729.1) - on Daypro as noted... not interested in Lyrica/ Savella/ Rheum consult... ~  She has chronic pain & trigger points assoc w/ the FM...  VITAMIN D DEFICIENCY (ICD-268.9) - she has had a very low Vit D level = 9 on 7/08 & 9/09... she was perscribed Vit D 50,000 u weekly but apparently never took this medication... we wrote it for her again w/ instructions to take it weekly until we tell her to stop, but she only took it for 4 weeks then stopped on her own in favor of 1000 u daily OTC supplement... ~  BMD 7/08 was WNL-  TScore in Spine= +0.6,  TScore  in Hips= -0.8.Marland Kitchen. ~   f/u Vit D level 5/10= 29, therefore continue the Vit D 1000/d. ~  F/u Vit D level 4/12 = 29 & she admits not taking the OTC supplement... rec incr to 2000u daily.  BORDERLINE B 12 level > Vit B12 level 5/10 = 267 (Hg= 12.6, MCV= 89)... rec to start Vit B12 supplement 100-500 micrograms/d w/ f/u level on return...  ANXIETY DEPRESSION (ICD-300.4) - on LEXAPRO 20mg /d & notes that this really helps... INSOMNIA (ICD-780.52)   Past Surgical History  Procedure Date  . Vesicovaginal fistula closure w/ tah 1990    w/ cystocele &retocele repairs Dr. Elana Alm  . Right colectomy 2005    for villous adenoma of the cecum Dr.streck  . Cardiac catheterization 02/19/06    EF 60%    Outpatient Encounter Prescriptions as of 06/02/2011  Medication Sig Dispense Refill  . B Complex Vitamins (VITAMIN-B COMPLEX PO) Take 1 tablet by mouth daily.        . cholecalciferol (VITAMIN D) 1000 UNITS tablet Take 2 Units by mouth daily.        Marland Kitchen escitalopram (LEXAPRO) 20 MG tablet TAKE 1 TABLET BY MOUTH ONCE A DAY  30 tablet  6  . glucose blood test strip 1 each by Other route as needed. Use as instructed check blood sugars once daily       . glyBURIDE-metformin  (GLUCOVANCE) 5-500 MG per tablet Take 2 tablets by mouth in the morning and 1 tablet by mouth at dinner  90 tablet  11  . JANUVIA 100 MG tablet TAKE 1 TABLET BY MOUTH ONCE A DAY  30 tablet  8  . oxaprozin (DAYPRO) 600 MG tablet 1 once daily as needed for joint pain take with food.  30 tablet  5  . quinapril (ACCUPRIL) 20 MG tablet Take 1 tablet (20 mg total) by mouth at bedtime.  30 tablet  11  . trimethoprim (TRIMPEX) 100 MG tablet Take 1 tablet by mouth At bedtime.      . vitamin B-12 (CYANOCOBALAMIN) 100 MCG tablet Take 50 mcg by mouth daily.        Marland Kitchen levothyroxine (SYNTHROID, LEVOTHROID) 100 MCG tablet TAKE 1 TABLET BY MOUTH ONCE A DAY  30 tablet  2  . ZETIA 10 MG tablet TAKE 1 TABLET BY MOUTH EVERY DAY  30 tablet  2    Allergies  Allergen Reactions  . Sulfonamide Derivatives     Review of Systems          Constitutional:   No  weight loss, night sweats,  Fevers, chills,  +fatigue, or  lassitude.  HEENT:   No headaches,  Difficulty swallowing,  Tooth/dental problems, or  Sore throat,                No sneezing, itching, ear ache, nasal congestion, post nasal drip,   CV:  No chest pain,  Orthopnea, PND, swelling in lower extremities, anasarca, dizziness, palpitations, syncope.   GI  No heartburn, indigestion, abdominal pain, nausea, vomiting, diarrhea, change in bowel habits, loss of appetite, bloody stools.   Resp: No shortness of breath with exertion or at rest.  No excess mucus, no productive cough,  No non-productive cough,  No coughing up of blood.  No change in color of mucus.  No wheezing.  No chest wall deformity  Skin: no rash or lesions.  GU: no dysuria, change in color of urine, no urgency or frequency.  No flank pain, no hematuria  MS:  No joint pain or swelling.  No decreased range of motion.     Psych:  No change in mood or affect. No depression or anxiety.  No memory loss.         Objective:   Physical Exam       WD, WN, 76  y/o WF in  NAD... GENERAL:  Alert & oriented; pleasant & cooperative... HEENT:  [place]/AT,  EACs-clear, TMs-wnl, NOSE-clear, THROAT-clear & wnl. NECK:  Supple w/ fairROM; no JVD; normal carotid impulses w/o bruits; no thyromegaly or nodules palpated; no lymphadenopathy. CHEST:  Clear to P & A; without wheezes/ rales/ or rhonchi. HEART:  Regular Rhythm; without murmurs/ rubs/ or gallops. ABDOMEN:  Soft & nontender; normal bowel sounds; no organomegaly or masses detected, no guarding or rebound,   EXT: without deformities, mild arthritic changes; no varicose veins/ venous insuffic/ or edema. NEURO:   gait normal & balance OK. DERM:  No lesions noted; no rash [email or web address]   Assessment & Plan:

## 2011-06-02 NOTE — Assessment & Plan Note (Signed)
Controlled on current medication regimen. Patient advised on a low-salt diet.

## 2011-06-02 NOTE — Assessment & Plan Note (Signed)
LDL goal is less than 70  She is advised on a low cholesterol diet. She has stopped her Zetia. We'll advise of her fasting lipid results, and it, and consider other cholesterol, alternatives.

## 2011-06-02 NOTE — Assessment & Plan Note (Signed)
Patient has stopped her Synthroid. We'll check a TSH and advised of results.

## 2011-06-02 NOTE — Assessment & Plan Note (Signed)
Patient advised on a low sweet diet. She is continuing her current regimen. Labs with hemoglobin A1c are pending today.

## 2011-06-02 NOTE — Patient Instructions (Signed)
Continue on low sweet diet , low cholestrol diet  TDAP today  We are referring you to your Gastroenterologist for your routine colonoscopy.  I will call with labs.  follow up Dr. Kriste Basque  In 3 months for Physical.

## 2011-06-03 LAB — VITAMIN D 25 HYDROXY (VIT D DEFICIENCY, FRACTURES): Vit D, 25-Hydroxy: 18 ng/mL — ABNORMAL LOW (ref 30–89)

## 2011-06-09 ENCOUNTER — Encounter (INDEPENDENT_AMBULATORY_CARE_PROVIDER_SITE_OTHER): Payer: Medicare Other

## 2011-06-09 DIAGNOSIS — R209 Unspecified disturbances of skin sensation: Secondary | ICD-10-CM

## 2011-06-09 DIAGNOSIS — I6529 Occlusion and stenosis of unspecified carotid artery: Secondary | ICD-10-CM

## 2011-06-12 ENCOUNTER — Telehealth: Payer: Self-pay | Admitting: Cardiovascular Disease

## 2011-06-12 NOTE — Telephone Encounter (Signed)
PT AWARE  DR NADEL'S OFFICE CALLED WITH CAROTID RESULTS. PT TO CALL AND ASK FOR MEGAN  TO GET REPORT .Zack Seal

## 2011-06-12 NOTE — Telephone Encounter (Signed)
New Problem: ° ° ° °Patient returned your call.  Please call back. °

## 2011-06-16 ENCOUNTER — Other Ambulatory Visit: Payer: Self-pay | Admitting: Pulmonary Disease

## 2011-06-23 ENCOUNTER — Encounter: Payer: Self-pay | Admitting: Adult Health

## 2011-06-23 ENCOUNTER — Ambulatory Visit (INDEPENDENT_AMBULATORY_CARE_PROVIDER_SITE_OTHER): Payer: Medicare Other | Admitting: Adult Health

## 2011-06-23 VITALS — BP 106/72 | HR 80 | Temp 97.4°F | Ht 63.0 in | Wt 152.4 lb

## 2011-06-23 DIAGNOSIS — I679 Cerebrovascular disease, unspecified: Secondary | ICD-10-CM

## 2011-06-23 DIAGNOSIS — E119 Type 2 diabetes mellitus without complications: Secondary | ICD-10-CM

## 2011-06-23 DIAGNOSIS — E039 Hypothyroidism, unspecified: Secondary | ICD-10-CM

## 2011-06-23 DIAGNOSIS — I10 Essential (primary) hypertension: Secondary | ICD-10-CM

## 2011-06-23 NOTE — Progress Notes (Signed)
Subjective:    Patient ID: Jaclyn [name], female    DOB: 1936/01/03, 76 y.o.   MRN: [id number]  HPI  76 y/o WF with known hx of  multiple medical problems as noted below- followed for HBP, PAF, Carotid disease, Hyperchol, DM, Hypothy, FM, Vit D defic, anxiety/ depression... she is also followed by Walker Kehr for Cardiology, and DrWeissman for GI.Marland Kitchen.  ~  February 15, 2010:  19 mo ROV- doing fair> she's had repeated bladder infections & saw DrOttelin 9/11- Cipro w/o help but she notes that Doxy worked well... BP controlled on Mavik but she notes that the generic "depresses me" but she doesn't want to change meds- just wants the non-generic rx...  she had f/u Cards 12/11- DrNishan, no signif CAD, atypCP, f/u CDopplers w/ mod plaque & bilat ICA stenoses- stable...  she has been trying to control Lipids w/ diet alone but unsuccessful & she wants Zetia trial... BS poorly controlled but she is not on diet & needs to incr meds (see below)... she notes heartburn but states that daily "mustard" really helps...  we discussed diet + exercise & need for more regular f/u visits.  ~  June 10, 2010:  1mo ROV & stable, just incr stress w/ 39 y/o daughter's alcoholism and health issues;  She had labs done last week and FLP improved on the Zetia, but BS/ A1c about the same (on Glucovance & Januvia);  BP controlled & she denies CP/ palpit, etc > see prob list below>  02/03/12  Follow up  Pt returns for follow up of UTI. Seen 1 month ago for UTI . Tx w/ Cipro. She returns today for follow up and check to make sure this has cleared completely. UA today is clear. She has no dysuria or discharge.  No fever or back pain.  >no changes   06/02/2011 followup Patient returns for a followup office visit. She informs me that she has stopped her Zetia . He is due to leg and muscle pains. She feels that her muscle spasms and pain have much improved since stopping Zetia . We talked about a low cholesterol diet, exercise, and weight  goals. She is fasting today. We will check her fasting lipid panel.  She also has stopped her thyroid medication. When questioned the reason for stopping this medication. She said, because she wants to".  Overall she says that she has been doing well since last seen in the office. She does have occasional, what sounds like paresthesias in the legs.  She denies any chest pain, syncope, palpitations, abdominal pain, bloody stools, or leg swelling.  Health maintenance. She is due for a tetanus booster. We discussed that TDAP as she is taking care of her great grandchild now. She is also due for a followup colonoscopy. She continues to get her gynecological care with yearly Pap smears and routine mammograms. Her Pneumovax is up-to-date. >A1C 9.4 , TSH 54 , LDL ~211   06/23/2011 Follow up  Patient returns for a followup visit. Last visit. Patient had stopped many of her medications. It is, unclear why she did this. Lab work showed that her cholesterol was significantly elevated with an LDL approximately around 211. Her TSH was 54 and her A1c had rose up to 9.4. She was strongly encouraged to restart her medications, including her Synthroid, Zetia. Was not taking her  Diabetic meds exactly as directed. Patient returns today reporting that she has restarted her medications and is tolerating them okay.  She denies any chest pain,  orthopnea, PND, leg swelling.  She has repeat carotid dopplers last week showing progression of disease with 60-79% bilaterally.  Previous card cath in 2008 was nml with EF 60%. Followed annually by Dr. Eden Emms.  Once again she was recommended on prevention with taking meds for b/p, DM and cholestrol with daily ASA .  No TIA symptoms. "I feel great " .  We also discussed if DM control does not improve will need to add Insulin as next step .  Really needs statin therapy but she refuses due to myalgias on statins.         Problem List:  HYPERTENSION (ICD-401.9) - on MAVIK 2mg /d  (she requests the non-generic Mavik because the generic "depresses me")...   ~  12/11:  BP today= 118/74 & feeling well, takes med regularly and tolerates well... denies visual changes, CP, palipit, dizziness, syncope, dyspnea, edema, etc...  ~  4/12:  BP= 134/64 & she continues to be essent asymptomatic...  CHEST PAIN, ATYPICAL (ICD-786.59) - she had cath for atyp CP 1/08 showing no signif CAD, EF= 60%... prev CWP part of her FM complex...  PAROXYSMAL ATRIAL FIBRILLATION (ICD-427.31) - followed by Walker Kehr & his notes are reviewed... she is holding NSR without palpit or arrhythmias noted... ~  2DEcho 5/05 was normal- no valve dis, normal wall thickness, norm EF...  CEREBROVASCULAR DISEASE (ICD-437.9) - on ASA 81mg /d... she has a right carotid bruit and bilat carotid stenoses> denies cerebral ischemic symptoms... ~  CDopplers 11/09 showed mod plaque in the prox ICAs bilat R>L, 60-79%R & 40-59%L, stable- no change going back to 2005 studies. ~  5/10:  reminded to take her ASA daily. ~  f/u CDopplers 12/11 showed mod plaque, stable bilat carotid dis w/ 60-79% RICA & 40-59% LICA stenoses.  HYPERLIPIDEMIA (ICD-272.4) - on Zetia 10mg  +diet... she has refused Statin Rx- "It makes my bones hurt so bad"> went to the [facility] but states she didn't tol Crestor5 "I hurt so bad- it's diff than my FM" & discomfort resolved off Rx... then tried FISH OIL & titrated up to 3/d but her f/u FLP was no better... ~  FLP 12/07 showed TChol 192, TG 172, HDL 38, LDL 120... on diet alone... pt refuses Statin Rx. ~  FLP 9/09 showed TChol 230, TG 203, HDL 38, LDL 176... rec- refer to [facility] ~  FLP 5/10 on FishOil showed TChol 231, TG 145, HDL 41, LDL 169 ~  FLP 12/11 showed TChol 244, TG 278, HDL 42, LDL 176...  try ZETIA 10mg /d. ~  FLP 4/12 on Zetia10 showed TChol 170, TG 175, HDL 41, LDL 94... Continue Zetia & diet...  DIABETES MELLITUS (ICD-250.00) - now on GLUCOVANCE 5-500 Bid and JANUVIA 100mg /d... ~  labs  2/09 showed BS= 180, HgA1c= 7.8.Marland KitchenMarland Kitchen on Glucov 2.5-500 taking 1 daily. ~  labs 9/09 showed BS= 219, HgA1c= 7.4... she ignored advise to incr to Bid. ~  discussed w/ pt 11/09: incr to Glucovance 5/500 Bid & Januvia 100mg /d... ~  labs 5/10 still on one of each (wt down 7# to 148#) showed BS= 167, A1c= 6.9 ~  labs 12/11 showed BS= 162, A1c= 8.6.Marland KitchenMarland Kitchen rec to incr Glucov5/500 Bid + Januv100/d. ~  Labs 4/12 showed BS=160, A1c=8.4.Marland KitchenMarland Kitchen rec incr to Glucov5/500- 2AM, 1PM; +Januv100/d.  HYPOTHYROIDISM (ICD-244.9) - now on SYNTHROID 162mcg/d...  ~  labs 11/08 showed TSH= 10.98... ?if taking med regularly? ~  labs 9/09 showed TSH= 0.63... rec- continue same dose. ~  labs 5/10 showed TSH= 0.49 ~  labs 12/11 showed TSH= 3.21  DIVERTICULOSIS OF COLON (ICD-562.10) & COLONIC POLYPS (ICD-211.3) - followed by DrWeissman... last colonoscopy 3/05 w divertics and cecal villous adenoma- referred to DrStreck w/ right colectomy 6/05 (no cancer in specimen).  SEBACEOUS CYST, BREAST (ICD-610.8) - infected seb cyst left chest wall treated in 2009 w/ Keflex & resolved.  DEGENERATIVE JOINT DISEASE (ICD-715.90) - Eval and Rx by DrRamos in 2007... calcif tendonitis in shoulders, DDD in CSpine... given Naprosyn & phys therapy... currently taking DAYPRO 600mg /d Prn...  FIBROMYALGIA (ICD-729.1) - on Daypro as noted... not interested in Lyrica/ Savella/ Rheum consult... ~  She has chronic pain & trigger points assoc w/ the FM...  VITAMIN D DEFICIENCY (ICD-268.9) - she has had a very low Vit D level = 9 on 7/08 & 9/09... she was perscribed Vit D 50,000 u weekly but apparently never took this medication... we wrote it for her again w/ instructions to take it weekly until we tell her to stop, but she only took it for 4 weeks then stopped on her own in favor of 1000 u daily OTC supplement... ~  BMD 7/08 was WNL-  TScore in Spine= +0.6,  TScore in Hips= -0.8.Marland Kitchen. ~   f/u Vit D level 5/10= 29, therefore continue the Vit D 1000/d. ~  F/u Vit  D level 4/12 = 29 & she admits not taking the OTC supplement... rec incr to 2000u daily.  BORDERLINE B 12 level > Vit B12 level 5/10 = 267 (Hg= 12.6, MCV= 89)... rec to start Vit B12 supplement 100-500 micrograms/d w/ f/u level on return...  ANXIETY DEPRESSION (ICD-300.4) - on LEXAPRO 20mg /d & notes that this really helps... INSOMNIA (ICD-780.52)   Past Surgical History  Procedure Date  . Vesicovaginal fistula closure w/ tah 1990    w/ cystocele &retocele repairs Dr. Elana Alm  . Right colectomy 2005    for villous adenoma of the cecum Dr.streck  . Cardiac catheterization 02/19/06    EF 60%    Outpatient Encounter Prescriptions as of 06/23/2011  Medication Sig Dispense Refill  . B Complex Vitamins (VITAMIN-B COMPLEX PO) Take 1 tablet by mouth daily.        . cholecalciferol (VITAMIN D) 1000 UNITS tablet Take 2 Units by mouth daily.        Marland Kitchen escitalopram (LEXAPRO) 20 MG tablet TAKE 1 TABLET BY MOUTH ONCE A DAY  30 tablet  6  . glucose blood test strip 1 each by Other route as needed. Use as instructed check blood sugars once daily       . glyBURIDE-metformin (GLUCOVANCE) 5-500 MG per tablet Take 2 tablets by mouth in the morning and 1 tablet by mouth at dinner  90 tablet  11  . JANUVIA 100 MG tablet TAKE 1 TABLET BY MOUTH ONCE A DAY  30 tablet  8  . levothyroxine (SYNTHROID, LEVOTHROID) 100 MCG tablet TAKE 1 TABLET BY MOUTH ONCE A DAY  30 tablet  2  . oxaprozin (DAYPRO) 600 MG tablet 1 once daily as needed for joint pain take with food.  30 tablet  5  . quinapril (ACCUPRIL) 20 MG tablet Take 1 tablet (20 mg total) by mouth at bedtime.  30 tablet  11  . trimethoprim (TRIMPEX) 100 MG tablet Take 1 tablet by mouth At bedtime.      . vitamin B-12 (CYANOCOBALAMIN) 100 MCG tablet Take 50 mcg by mouth daily.        Marland Kitchen ZETIA 10 MG tablet TAKE 1 TABLET BY MOUTH  EVERY DAY  30 tablet  2    Allergies  Allergen Reactions  . Sulfonamide Derivatives     Review of Systems          Constitutional:    No  weight loss, night sweats,  Fevers, chills,  +fatigue, or  lassitude.  HEENT:   No headaches,  Difficulty swallowing,  Tooth/dental problems, or  Sore throat,                No sneezing, itching, ear ache, nasal congestion, post nasal drip,   CV:  No chest pain,  Orthopnea, PND, swelling in lower extremities, anasarca, dizziness, palpitations, syncope.   GI  No heartburn, indigestion, abdominal pain, nausea, vomiting, diarrhea, change in bowel habits, loss of appetite, bloody stools.   Resp: No shortness of breath with exertion or at rest.  No excess mucus, no productive cough,  No non-productive cough,  No coughing up of blood.  No change in color of mucus.  No wheezing.  No chest wall deformity  Skin: no rash or lesions.  GU: no dysuria, change in color of urine, no urgency or frequency.  No flank pain, no hematuria   MS:  No joint pain or swelling.  No decreased range of motion.     Psych:  No change in mood or affect. No depression or anxiety.  No memory loss.         Objective:   Physical Exam       WD, WN, 76  y/o WF in NAD... GENERAL:  Alert & oriented; pleasant & cooperative... HEENT:  [place]/AT,  EACs-clear, TMs-wnl, NOSE-clear, THROAT-clear & wnl. NECK:  Supple w/ fairROM; no JVD; normal carotid impulses w/o bruits; no thyromegaly or nodules palpated; no lymphadenopathy. CHEST:  Clear to P & A; without wheezes/ rales/ or rhonchi. HEART:  Regular Rhythm; without murmurs/ rubs/ or gallops. ABDOMEN:  Soft & nontender; normal bowel sounds; no organomegaly or masses detected, no guarding or rebound,   EXT: without deformities, mild arthritic changes; no varicose veins/ venous insuffic/ or edema. NEURO:   gait normal & balance OK. DERM:  No lesions noted; no rash [email or web address]   Assessment & Plan:

## 2011-06-23 NOTE — Assessment & Plan Note (Signed)
uncotrolled advised on med management  Along with diet and weight loss If not improving , will need to add Insulin

## 2011-06-23 NOTE — Assessment & Plan Note (Signed)
Controlled on rx   

## 2011-06-23 NOTE — Patient Instructions (Signed)
Take meds as directed.  Low fat cholestrol and sweets diet.  Work on weight loss.  follow up Dr. Kriste Basque  In 6 weeks and As needed  -will get labs done  Please contact office for sooner follow up if symptoms do not improve or worsen or seek emergency care

## 2011-06-23 NOTE — Assessment & Plan Note (Signed)
Progression of Carotid disease -recent doppler shows 60-79% bilaterally  Advised to take meds daily as directed along with ASA daily .  follow up Dr. Kriste Basque  In 6 weeks and As needed

## 2011-06-23 NOTE — Assessment & Plan Note (Signed)
Restarted meds, cont on current dose .  Repeat labs on return in 6 weeks

## 2011-06-27 ENCOUNTER — Telehealth: Payer: Self-pay | Admitting: Pulmonary Disease

## 2011-06-27 MED ORDER — GLYBURIDE-METFORMIN 5-500 MG PO TABS: ORAL_TABLET | ORAL | Status: DC

## 2011-06-27 NOTE — Telephone Encounter (Signed)
Pt notified that refill for Glyburide / Metformin was [facility] [address]

## 2011-08-06 ENCOUNTER — Ambulatory Visit: Payer: Medicare Other | Admitting: Pulmonary Disease

## 2011-08-15 ENCOUNTER — Other Ambulatory Visit: Payer: Self-pay | Admitting: Gynecology

## 2011-08-15 DIAGNOSIS — R928 Other abnormal and inconclusive findings on diagnostic imaging of breast: Secondary | ICD-10-CM

## 2011-08-29 ENCOUNTER — Ambulatory Visit: Payer: Medicare Other | Admitting: Adult Health

## 2011-09-01 ENCOUNTER — Ambulatory Visit (INDEPENDENT_AMBULATORY_CARE_PROVIDER_SITE_OTHER): Payer: Medicare Other | Admitting: Adult Health

## 2011-09-01 ENCOUNTER — Other Ambulatory Visit (INDEPENDENT_AMBULATORY_CARE_PROVIDER_SITE_OTHER): Payer: Medicare Other

## 2011-09-01 ENCOUNTER — Encounter: Payer: Self-pay | Admitting: Adult Health

## 2011-09-01 VITALS — BP 118/68 | HR 78 | Temp 97.8°F | Ht 63.0 in | Wt 147.2 lb

## 2011-09-01 DIAGNOSIS — B351 Tinea unguium: Secondary | ICD-10-CM

## 2011-09-01 DIAGNOSIS — E119 Type 2 diabetes mellitus without complications: Secondary | ICD-10-CM

## 2011-09-01 DIAGNOSIS — E039 Hypothyroidism, unspecified: Secondary | ICD-10-CM

## 2011-09-01 LAB — BASIC METABOLIC PANEL
BUN: 15 mg/dL (ref 6–23)
CO2: 22 mEq/L (ref 19–32)
Calcium: 10 mg/dL (ref 8.4–10.5)
Creatinine, Ser: 0.9 mg/dL (ref 0.4–1.2)

## 2011-09-01 NOTE — Patient Instructions (Addendum)
We are referring you to Podiatry  May do epson soaks.  I will call with labs  Low sweet diet  Please contact office for sooner follow up if symptoms do not improve or worsen or seek emergency care  follow up Dr. Kriste Basque  In 6-8 weeks for Physical

## 2011-09-02 NOTE — Assessment & Plan Note (Signed)
Advised to keep follow up with Dr. Kriste Basque   Needs better control  Check labs if not better add lantus  Diet and exericse

## 2011-09-02 NOTE — Assessment & Plan Note (Signed)
Refer to podiatry as she has underlying DM (poor control) and is not a candidate for systemic antifungal rx

## 2011-09-02 NOTE — Progress Notes (Signed)
Subjective:    Patient ID: Jaclyn [name], female    DOB: 1935/05/02, 76 y.o.   MRN: [id number]  HPI  76 y/o WF with known hx of  multiple medical problems as noted below- followed for HBP, PAF, Carotid disease, Hyperchol, DM, Hypothy, FM, Vit D defic, anxiety/ depression... she is also followed by Walker Kehr for Cardiology, and DrWeissman for GI.Marland Kitchen.  ~  February 15, 2010:  19 mo ROV- doing fair> she's had repeated bladder infections & saw DrOttelin 9/11- Cipro w/o help but she notes that Doxy worked well... BP controlled on Mavik but she notes that the generic "depresses me" but she doesn't want to change meds- just wants the non-generic rx...  she had f/u Cards 12/11- DrNishan, no signif CAD, atypCP, f/u CDopplers w/ mod plaque & bilat ICA stenoses- stable...  she has been trying to control Lipids w/ diet alone but unsuccessful & she wants Zetia trial... BS poorly controlled but she is not on diet & needs to incr meds (see below)... she notes heartburn but states that daily "mustard" really helps...  we discussed diet + exercise & need for more regular f/u visits.  ~  June 10, 2010:  74mo ROV & stable, just incr stress w/ 74 y/o daughter's alcoholism and health issues;  She had labs done last week and FLP improved on the Zetia, but BS/ A1c about the same (on Glucovance & Januvia);  BP controlled & she denies CP/ palpit, etc > see prob list below>  02/03/12  Follow up  Pt returns for follow up of UTI. Seen 1 month ago for UTI . Tx w/ Cipro. She returns today for follow up and check to make sure this has cleared completely. UA today is clear. She has no dysuria or discharge.  No fever or back pain.  >no changes   06/02/2011 followup Patient returns for a followup office visit. She informs me that she has stopped her Zetia . He is due to leg and muscle pains. She feels that her muscle spasms and pain have much improved since stopping Zetia . We talked about a low cholesterol diet, exercise, and weight  goals. She is fasting today. We will check her fasting lipid panel.  She also has stopped her thyroid medication. When questioned the reason for stopping this medication. She said, because she wants to".  Overall she says that she has been doing well since last seen in the office. She does have occasional, what sounds like paresthesias in the legs.  She denies any chest pain, syncope, palpitations, abdominal pain, bloody stools, or leg swelling.  Health maintenance. She is due for a tetanus booster. We discussed that TDAP as she is taking care of her great grandchild now. She is also due for a followup colonoscopy. She continues to get her gynecological care with yearly Pap smears and routine mammograms. Her Pneumovax is up-to-date. >A1C 9.4 , TSH 54 , LDL ~211   06/23/11  Follow up  Patient returns for a followup visit. Last visit. Patient had stopped many of her medications. It is, unclear why she did this. Lab work showed that her cholesterol was significantly elevated with an LDL approximately around 211. Her TSH was 54 and her A1c had rose up to 9.4. She was strongly encouraged to restart her medications, including her Synthroid, Zetia. Was not taking her  Diabetic meds exactly as directed. Patient returns today reporting that she has restarted her medications and is tolerating them okay.  She denies any chest  pain, orthopnea, PND, leg swelling.  She has repeat carotid dopplers last week showing progression of disease with 60-79% bilaterally.  Previous card cath in 2008 was nml with EF 60%. Followed annually by Dr. Eden Emms.  Once again she was recommended on prevention with taking meds for b/p, DM and cholestrol with daily ASA .  No TIA symptoms. "I feel great " .  We also discussed if DM control does not improve will need to add Insulin as next step .  Really needs statin therapy but she refuses due to myalgias on statins.  >>labs   09/01/11 acute OV  Complains of nail fungus on right great  toe Pt complains of  loose toenail "it has a fungus"   No redness or drainage.  Of note has missed several of her last follow up ov with Dr. Kriste Basque   Last ov in May, she had stopped several of her meds  She restarted them back after labs showed severe hypothyroid along poorly controlled DM.  She says her sugars are still high despite taking her meds reguarly.  Long discussion regarding the dangers of poor controlled thyroid and DM . No chest pain or urinary symptoms.            Problem List:  HYPERTENSION (ICD-401.9) - on MAVIK 2mg /d (she requests the non-generic Mavik because the generic "depresses me")...   ~  12/11:  BP today= 118/74 & feeling well, takes med regularly and tolerates well... denies visual changes, CP, palipit, dizziness, syncope, dyspnea, edema, etc...  ~  4/12:  BP= 134/64 & she continues to be essent asymptomatic...  CHEST PAIN, ATYPICAL (ICD-786.59) - she had cath for atyp CP 1/08 showing no signif CAD, EF= 60%... prev CWP part of her FM complex...  PAROXYSMAL ATRIAL FIBRILLATION (ICD-427.31) - followed by Walker Kehr & his notes are reviewed... she is holding NSR without palpit or arrhythmias noted... ~  2DEcho 5/05 was normal- no valve dis, normal wall thickness, norm EF...  CEREBROVASCULAR DISEASE (ICD-437.9) - on ASA 81mg /d... she has a right carotid bruit and bilat carotid stenoses> denies cerebral ischemic symptoms... ~  CDopplers 11/09 showed mod plaque in the prox ICAs bilat R>L, 60-79%R & 40-59%L, stable- no change going back to 2005 studies. ~  5/10:  reminded to take her ASA daily. ~  f/u CDopplers 12/11 showed mod plaque, stable bilat carotid dis w/ 60-79% RICA & 40-59% LICA stenoses.  HYPERLIPIDEMIA (ICD-272.4) - on Zetia 10mg  +diet... she has refused Statin Rx- "It makes my bones hurt so bad"> went to [facility] but states she didn't tol Crestor5 "I hurt so bad- it's diff than my FM" & discomfort resolved off Rx... then tried FISH OIL & titrated up  to 3/d but her f/u FLP was no better... ~  FLP 12/07 showed TChol 192, TG 172, HDL 38, LDL 120... on diet alone... pt refuses Statin Rx. ~  FLP 9/09 showed TChol 230, TG 203, HDL 38, LDL 176... rec- refer to [facility] ~  FLP 5/10 on FishOil showed TChol 231, TG 145, HDL 41, LDL 169 ~  FLP 12/11 showed TChol 244, TG 278, HDL 42, LDL 176...  try ZETIA 10mg /d. ~  FLP 4/12 on Zetia10 showed TChol 170, TG 175, HDL 41, LDL 94... Continue Zetia & diet...  DIABETES MELLITUS (ICD-250.00) - now on GLUCOVANCE 5-500 Bid and JANUVIA 100mg /d... ~  labs 2/09 showed BS= 180, HgA1c= 7.8.Marland KitchenMarland Kitchen on Glucov 2.5-500 taking 1 daily. ~  labs 9/09 showed BS= 219, HgA1c=  7.4... she ignored advise to incr to Bid. ~  discussed w/ pt 11/09: incr to Glucovance 5/500 Bid & Januvia 100mg /d... ~  labs 5/10 still on one of each (wt down 7# to 148#) showed BS= 167, A1c= 6.9 ~  labs 12/11 showed BS= 162, A1c= 8.6.Marland KitchenMarland Kitchen rec to incr Glucov5/500 Bid + Januv100/d. ~  Labs 4/12 showed BS=160, A1c=8.4.Marland KitchenMarland Kitchen rec incr to Glucov5/500- 2AM, 1PM; +Januv100/d.  HYPOTHYROIDISM (ICD-244.9) - now on SYNTHROID 174mcg/d...  ~  labs 11/08 showed TSH= 10.98... ?if taking med regularly? ~  labs 9/09 showed TSH= 0.63... rec- continue same dose. ~  labs 5/10 showed TSH= 0.49 ~  labs 12/11 showed TSH= 3.21  DIVERTICULOSIS OF COLON (ICD-562.10) & COLONIC POLYPS (ICD-211.3) - followed by DrWeissman... last colonoscopy 3/05 w divertics and cecal villous adenoma- referred to DrStreck w/ right colectomy 6/05 (no cancer in specimen).  SEBACEOUS CYST, BREAST (ICD-610.8) - infected seb cyst left chest wall treated in 2009 w/ Keflex & resolved.  DEGENERATIVE JOINT DISEASE (ICD-715.90) - Eval and Rx by DrRamos in 2007... calcif tendonitis in shoulders, DDD in CSpine... given Naprosyn & phys therapy... currently taking DAYPRO 600mg /d Prn...  FIBROMYALGIA (ICD-729.1) - on Daypro as noted... not interested in Lyrica/ Savella/ Rheum consult... ~  She has chronic pain  & trigger points assoc w/ the FM...  VITAMIN D DEFICIENCY (ICD-268.9) - she has had a very low Vit D level = 9 on 7/08 & 9/09... she was perscribed Vit D 50,000 u weekly but apparently never took this medication... we wrote it for her again w/ instructions to take it weekly until we tell her to stop, but she only took it for 4 weeks then stopped on her own in favor of 1000 u daily OTC supplement... ~  BMD 7/08 was WNL-  TScore in Spine= +0.6,  TScore in Hips= -0.8.Marland Kitchen. ~   f/u Vit D level 5/10= 29, therefore continue the Vit D 1000/d. ~  F/u Vit D level 4/12 = 29 & she admits not taking the OTC supplement... rec incr to 2000u daily.  BORDERLINE B 12 level > Vit B12 level 5/10 = 267 (Hg= 12.6, MCV= 89)... rec to start Vit B12 supplement 100-500 micrograms/d w/ f/u level on return...  ANXIETY DEPRESSION (ICD-300.4) - on LEXAPRO 20mg /d & notes that this really helps... INSOMNIA (ICD-780.52)   Past Surgical History  Procedure Date  . Vesicovaginal fistula closure w/ tah 1990    w/ cystocele &retocele repairs Dr. Elana Alm  . Right colectomy 2005    for villous adenoma of the cecum Dr.streck  . Cardiac catheterization 02/19/06    EF 60%    Outpatient Encounter Prescriptions as of 09/01/2011  Medication Sig Dispense Refill  . B Complex Vitamins (VITAMIN-B COMPLEX PO) Take 1 tablet by mouth daily.        . cholecalciferol (VITAMIN D) 1000 UNITS tablet Take 2 Units by mouth daily.        Marland Kitchen escitalopram (LEXAPRO) 20 MG tablet TAKE 1 TABLET BY MOUTH ONCE A DAY  30 tablet  6  . glucose blood test strip 1 each by Other route as needed. Use as instructed check blood sugars once daily       . glyBURIDE-metformin (GLUCOVANCE) 5-500 MG per tablet Take 2 tablets by mouth in the morning and 1 tablet by mouth at dinner  90 tablet  11  . JANUVIA 100 MG tablet TAKE 1 TABLET BY MOUTH ONCE A DAY  30 tablet  8  . levothyroxine (SYNTHROID,  LEVOTHROID) 100 MCG tablet TAKE 1 TABLET BY MOUTH ONCE A DAY  30 tablet  2  .  oxaprozin (DAYPRO) 600 MG tablet 1 once daily as needed for joint pain take with food.  30 tablet  5  . quinapril (ACCUPRIL) 20 MG tablet Take 1 tablet (20 mg total) by mouth at bedtime.  30 tablet  11  . trimethoprim (TRIMPEX) 100 MG tablet Take 1 tablet by mouth At bedtime.      . vitamin B-12 (CYANOCOBALAMIN) 100 MCG tablet Take 50 mcg by mouth daily.        Marland Kitchen ZETIA 10 MG tablet TAKE 1 TABLET BY MOUTH EVERY DAY  30 tablet  2    Allergies  Allergen Reactions  . Sulfonamide Derivatives     Review of Systems          Constitutional:   No  weight loss, night sweats,  Fevers, chills,  +fatigue, or  lassitude.  HEENT:   No headaches,  Difficulty swallowing,  Tooth/dental problems, or  Sore throat,                No sneezing, itching, ear ache, nasal congestion, post nasal drip,   CV:  No chest pain,  Orthopnea, PND, swelling in lower extremities, anasarca, dizziness, palpitations, syncope.   GI  No heartburn, indigestion, abdominal pain, nausea, vomiting, diarrhea, change in bowel habits, loss of appetite, bloody stools.   Resp: No shortness of breath with exertion or at rest.  No excess mucus, no productive cough,  No non-productive cough,  No coughing up of blood.  No change in color of mucus.  No wheezing.  No chest wall deformity  Skin: no rash or lesions.  GU: no dysuria, change in color of urine, no urgency or frequency.  No flank pain, no hematuria   MS:  No joint pain or swelling.  No decreased range of motion.     Psych:  No change in mood or affect. No depression or anxiety.  No memory loss.         Objective:   Physical Exam       WD, WN, 76  y/o WF in NAD... GENERAL:  Alert & oriented; pleasant & cooperative... HEENT:  [place]/AT,  EACs-clear, TMs-wnl, NOSE-clear, THROAT-clear & wnl. NECK:  Supple w/ fairROM; no JVD; normal carotid impulses w/o bruits; no thyromegaly or nodules palpated; no lymphadenopathy. CHEST:  Clear to P & A; without wheezes/ rales/ or  rhonchi. HEART:  Regular Rhythm; without murmurs/ rubs/ or gallops. ABDOMEN:  Soft & nontender; normal bowel sounds; no organomegaly or masses detected, no guarding or rebound,   EXT: without deformities, mild arthritic changes; no varicose veins/ venous insuffic/ or edema. NEURO:   gait normal & balance OK. DERM:  No lesions noted; no rash [place].es Right great toe noted onchymosis , nail is loose   Assessment & Plan:

## 2011-09-02 NOTE — Assessment & Plan Note (Signed)
Advised on med compliance  Check tsh

## 2011-09-03 ENCOUNTER — Other Ambulatory Visit: Payer: Self-pay | Admitting: Adult Health

## 2011-09-03 MED ORDER — INSULIN GLARGINE 100 UNIT/ML ~~LOC~~ SOLN: 20.0000 [IU] | Freq: Every day | SUBCUTANEOUS | Status: DC

## 2011-09-03 NOTE — Progress Notes (Signed)
Result Notes     Notes Recorded by Sherre Lain, MA on 09/03/2011 at 4:50 PM Patient returned call. Advised of lab results / recs as stated by TP. Pt requested to have an insulin pen rather than the vial b/c she is worried about drawing up the correct dose. Advised pt will send order for the lantus solostar pen. ------  Notes Recorded by Julio Sicks, NP on 09/02/2011 at 1:12 PM DM way out of control need to start insulin  Begin Lantus 20 units daily SQ  Check sugars daily and keep log  follow up Dr. Kriste Basque As planned in 1 month for CPX      ------------------------------------------------------------------------------------- cpx appt scheduled with SN 8.26.13 @ 4pm.  BS log and glycemic index (printed from www.diabetes.org) printed off and mailed to pt per her request.  Called [facility] for 1 month's quantity dosing for the solostar pen = 5 pens will afford pt enough for 1 month.  Rx sent electronically to verified pharmacy.  Pt aware to call if any questions / concerns.

## 2011-09-04 NOTE — Progress Notes (Signed)
Quick Note:  Pt aware of results. ______ 

## 2011-09-05 ENCOUNTER — Telehealth: Payer: Self-pay | Admitting: Pulmonary Disease

## 2011-09-05 NOTE — Telephone Encounter (Signed)
Per TP: yes.

## 2011-09-05 NOTE — Telephone Encounter (Signed)
Called and spoke with pt and she stated that she was going to pick up her insulin today and wanted to call and make sure that she is to stay on all of her other DM meds as well.  TP please advise.  thanks

## 2011-09-05 NOTE — Telephone Encounter (Signed)
Called and spoke with pt and she is aware of TP recs. She will go today and pick up her insulin.  Nothing further needed.

## 2011-09-22 ENCOUNTER — Other Ambulatory Visit: Payer: Self-pay | Admitting: Pulmonary Disease

## 2011-10-13 ENCOUNTER — Ambulatory Visit (INDEPENDENT_AMBULATORY_CARE_PROVIDER_SITE_OTHER): Payer: Medicare Other | Admitting: Pulmonary Disease

## 2011-10-13 ENCOUNTER — Encounter: Payer: Self-pay | Admitting: Pulmonary Disease

## 2011-10-13 VITALS — BP 124/68 | HR 80 | Temp 97.1°F | Ht 63.0 in | Wt 149.6 lb

## 2011-10-13 DIAGNOSIS — IMO0001 Reserved for inherently not codable concepts without codable children: Secondary | ICD-10-CM

## 2011-10-13 DIAGNOSIS — F341 Dysthymic disorder: Secondary | ICD-10-CM

## 2011-10-13 DIAGNOSIS — M1990 Unspecified osteoarthritis, unspecified site: Secondary | ICD-10-CM

## 2011-10-13 DIAGNOSIS — K5730 Diverticulosis of large intestine without perforation or abscess without bleeding: Secondary | ICD-10-CM

## 2011-10-13 DIAGNOSIS — E119 Type 2 diabetes mellitus without complications: Secondary | ICD-10-CM

## 2011-10-13 DIAGNOSIS — D126 Benign neoplasm of colon, unspecified: Secondary | ICD-10-CM

## 2011-10-13 DIAGNOSIS — I10 Essential (primary) hypertension: Secondary | ICD-10-CM

## 2011-10-13 DIAGNOSIS — I4891 Unspecified atrial fibrillation: Secondary | ICD-10-CM

## 2011-10-13 DIAGNOSIS — E559 Vitamin D deficiency, unspecified: Secondary | ICD-10-CM

## 2011-10-13 DIAGNOSIS — E785 Hyperlipidemia, unspecified: Secondary | ICD-10-CM

## 2011-10-13 DIAGNOSIS — I679 Cerebrovascular disease, unspecified: Secondary | ICD-10-CM

## 2011-10-13 DIAGNOSIS — E039 Hypothyroidism, unspecified: Secondary | ICD-10-CM

## 2011-10-13 NOTE — Progress Notes (Signed)
Subjective:    Patient ID: Jaclyn [name], female    DOB: 1935-05-30, 76 y.o.   MRN: [id number]  HPI 76 y/o WF here for a follow up visit... she has multiple medical problems as noted below- followed for HBP, PAF, Carotid disease, Hyperchol, DM, Hypothy, FM, Vit D defic, anxiety/ depression... she is also followed by Jaclyn [name] for Cardiology, and DrWeissman for GI.Marland Kitchen.  ~  February 15, 2010:  19 mo ROV- doing fair> she's had repeated bladder infections & saw DrOttelin 9/11- Cipro w/o help but she notes that Doxy worked well... BP controlled on Mavik but she notes that the generic "depresses me" but she doesn't want to change meds- just wants the non-generic rx...  she had f/u Cards 12/11- DrNishan, no signif CAD, atypCP, f/u CDopplers w/ mod plaque & bilat ICA stenoses- stable...  she has been trying to control Lipids w/ diet alone but unsuccessful & she wants Zetia trial... BS poorly controlled but she is not on diet & needs to incr meds (see below)... she notes heartburn but states that daily "mustard" really helps...  we discussed diet + exercise & need for more regular f/u visits.  ~  June 10, 2010:  35mo ROV & stable, just incr stress w/ 64 y/o daughter's alcoholism and health issues;  She had labs done last week and FLP improved on the Zetia, but BS/ A1c about the same (on Glucovance & Januvia);  BP controlled & she denies CP/ palpit, etc > see prob list below>  ~  October 13, 2011:  78mo ROV & Le[place] was started on LANTUS 20u/d by TP last month when her BS=364 & A1c=9.7 on her Glucovance/ Januvia meds;  Since then her BS has improved & review of her meter shows BS 120-190 range;  We discussed maximizing the Glucovance 5/500 to 2Bid & continue the Januvia100 & Lantus20; we plan f/u OV & labs in 2 months;  We reviewed low carb diet & exercise program...    She saw Jaclyn [name] last 1 yr ago for f/u of her HBP, Lipids, Carotid dis> hx normal cath in 2008, denies CP/ palpit/ SOB/ cerebral ischemic symptoms;  no changes made.    Follow up CDopplers done last 4/13 & showed mod heterogeneous plaque bilat, 60-79% bilat ICAstenoses, patent vertebrals, f/u in 45mo...    She had numerous f/u visits w/ TP this past yr> UTI, problems w/ Zetia, she stopped her Synthroid (?why), nail fungus; all updated below...    She saw DrOttelin for Urology last 6/12> dysuria, hx IC, recurrent UTIs, mild urge incont; treated w/ Trimethoprim 100mg  Qhs which really helps & no infections this yr. We reviewed prob list, meds, xrays and labs> see below for updates >>          Problem List:  HYPERTENSION (ICD-401.9) - prev on Mavik 2mg /d but switched to ACCUPRIL 20mg /d to save $$ ~  12/11:  BP= 118/74 on Mavik2 & feeling well, takes med regularly and tolerates well... denies visual changes, CP, palipit, dizziness, syncope, dyspnea, edema, etc...  ~  4/12:  BP= 134/64 & she continues to be essent asymptomatic... ~  8/13:  BP= 124/68 on Quinapril20 now; denies HA, cough, CP, palpit, SOB, edema, etc...  CHEST PAIN, ATYPICAL (ICD-786.59) - she had cath for atyp CP 1/08 showing no signif CAD, EF= 60%... prev CWP part of her FM complex... ~  She has MULT coronary risk factors w/ HBP, DM, Chol, & known carotid vasc dis... ~  CXR 12/11 showed normal heart  size, mild peribronch thickening, clear lungs, NAD...  PAROXYSMAL ATRIAL FIBRILLATION (ICD-427.31) - followed by Jaclyn [name] & his notes are reviewed... she is holding NSR without palpit or arrhythmias noted... ~  2DEcho 5/05 was normal- no valve dis, normal wall thickness, norm EF...  CEREBROVASCULAR DISEASE (ICD-437.9) - on ASA 81mg /d... she has a right carotid bruit and bilat carotid stenoses> denies cerebral ischemic symptoms... ~  CDopplers 11/09 showed mod plaque in the prox ICAs bilat R>L, 60-79%R & 40-59%L, stable- no change going back to 2005 studies. ~  5/10:  reminded to take her ASA daily. ~  f/u CDopplers 12/11 showed mod plaque, stable bilat carotid dis w/ 60-79% RICA &  40-59% LICA stenoses. ~  f/u CDopplers 4/13 showed mod heterogeneous plaque bilat, 60-79% bilat ICAstenoses, patent vertebrals, f/u in 96mo...  HYPERLIPIDEMIA (ICD-272.4) - on Zetia 10mg  +diet... she has refused Statin Rx- "It makes my bones hurt so bad"> [facility] but states she didn't tol Crestor5 "I hurt so bad- it's diff than my FM" & discomfort resolved off Rx... then tried FISH OIL & titrated up to 3/d but her f/u FLP was no better... ~  FLP 12/07 showed TChol 192, TG 172, HDL 38, LDL 120... on diet alone... pt refuses Statin Rx. ~  FLP 9/09 showed TChol 230, TG 203, HDL 38, LDL 176... rec- [facility] ~  FLP 5/10 on FishOil showed TChol 231, TG 145, HDL 41, LDL 169 ~  FLP 12/11 showed TChol 244, TG 278, HDL 42, LDL 176...  try ZETIA 10mg /d. ~  FLP 4/12 on Zetia10 showed TChol 170, TG 175, HDL 41, LDL 94... Continue Zetia & diet... ~  Pt says she developed increased aching, soreness, pain & cut back the Zetia to MWF, then stopped it on her own, also stopped her Synthroid on her own... ~  FLP 4/13 off meds showed TChol 319, TG 345, HDL 54, LDL 213... her TSH off Synthroid was 55 & she refused Chol meds but agreed to restart SYNTHROID 100/d. ~  8/13: pt says she tried the Zetia again but INTOL "it made me feel like I have dementia"; she is not fasting today> she mentioned that a lady at [facility] recommended "ginger pills and flax seed cookies" so she wants to try these next...  DIABETES MELLITUS (ICD-250.00) - now on GLUCOVANCE 5-500 2AM & 1PM and JANUVIA 100mg /d... ~  labs 2/09 showed BS= 180, HgA1c= 7.8.Marland KitchenMarland Kitchen on Glucov 2.5-500 taking 1 daily. ~  labs 9/09 showed BS= 219, HgA1c= 7.4... she ignored advise to incr to Bid; Januvia100 added. ~  labs 5/10 still on one of each (wt down 7# to 148#) showed BS= 167, A1c= 6.9 ~  labs 12/11 showed BS= 162, A1c= 8.6.Marland KitchenMarland Kitchen rec to incr Glucov5/500 Bid + Januv100/d. ~  Labs 4/12 showed BS=160, A1c=8.4.Marland KitchenMarland Kitchen rec incr to Glucov5/500- 2AM, 1PM;  +Januv100/d. ~  Labs 4/13 showed BS= 281, A1c=9.4... But she had stopped mult meds including Synthroid=> told to restart meds. ~  Labs 7/13 showed BS= 364, A1c= 9.7.Marland KitchenMarland Kitchen Pt started on LANTUS 20u daily, and subseq Glucovance incr to 2Bid.  HYPOTHYROIDISM (ICD-244.9) - on SYNTHROID 137mcg/d...  ~  labs 11/08 showed TSH= 10.98... ?if taking med regularly? ~  labs 9/09 showed TSH= 0.63... rec- continue same dose. ~  labs 5/10 showed TSH= 0.49 ~  labs 12/11 showed TSH= 3.21 ~  Labs 4/13 showed TSH= 55... Pt had stopped Synthroid on her own. ~  Labs 7/13 on Synth100 showed TSH=  2.71  DIVERTICULOSIS OF COLON (ICD-562.10) & COLONIC POLYPS (ICD-211.3) - followed by DrWeissman... last colonoscopy 3/05 w divertics and cecal villous adenoma- referred to DrStreck w/ right colectomy 6/05 (no cancer in specimen)... ~  Follow up colonoscopy 10/09 by DrWeissman> patent end-to-end ileo-colonic anastomosis, divertics in desc & sigmoid regions, +hemorrhoids; f/u 5 yrs...  SEBACEOUS CYST, BREAST (ICD-610.8) - infected seb cyst left chest wall treated in 2009 w/ Keflex & resolved.  DEGENERATIVE JOINT DISEASE (ICD-715.90) - Eval and Rx by DrRamos in 2007... calcif tendonitis in shoulders, DDD in CSpine... given Naprosyn & phys therapy... currently taking DAYPRO 600mg /d Prn...  FIBROMYALGIA (ICD-729.1) - on Daypro as noted... not interested in Lyrica/ Savella/ Rheum consult... ~  She has chronic pain & trigger points assoc w/ the FM...  VITAMIN D DEFICIENCY (ICD-268.9) - she has had a very low Vit D level = 9 on 7/08 & 9/09... she was perscribed Vit D 50,000 u weekly but apparently never took this medication... we wrote it for her again w/ instructions to take it weekly until we tell her to stop, but she only took it for 4 weeks then stopped on her own in favor of 1000 u daily OTC supplement... ~  BMD 7/08 was WNL-  TScore in Spine= +0.6,  TScore in Hips= -0.8.Marland Kitchen. ~   f/u Vit D level 5/10= 29, therefore continue the  Vit D 1000/d. ~  F/u Vit D level 4/12 = 29 & she admits not taking the OTC supplement... rec incr to 2000u daily. ~  Labs 4/13 showed Vit D level = 18 & she is reminded to take the supplement every day...   BORDERLINE B 12 level > Vit B12 level 5/10 = 267 (Hg= 12.6, MCV= 89)... rec to start Vit B12 supplement 100-500 micrograms/d w/ f/u level on return... ~  Labs 4/13 showed B12 level = 476... Continue same.  ANXIETY DEPRESSION (ICD-300.4) - on LEXAPRO 20mg /d & notes that this really helps... INSOMNIA (ICD-780.52)   Past Surgical History  Procedure Date  . Vesicovaginal fistula closure w/ tah 1990    w/ cystocele &retocele repairs Dr. Elana Alm  . Right colectomy 2005    for villous adenoma of the cecum Dr.streck  . Cardiac catheterization 02/19/06    EF 60%    Outpatient Encounter Prescriptions as of 10/13/2011  Medication Sig Dispense Refill  . B Complex Vitamins (VITAMIN-B COMPLEX PO) Take 1 tablet by mouth daily.        . cholecalciferol (VITAMIN D) 1000 UNITS tablet Take 2 Units by mouth daily.        Marland Kitchen escitalopram (LEXAPRO) 20 MG tablet TAKE 1 TABLET BY MOUTH ONCE A DAY  30 tablet  0  . glucose blood test strip 1 each by Other route as needed. Use as instructed check blood sugars once daily       . glyBURIDE-metformin (GLUCOVANCE) 5-500 MG per tablet Take 2 tablets by mouth in the morning and 1 tablet by mouth at dinner  90 tablet  11  . insulin glargine (LANTUS SOLOSTAR) 100 UNIT/ML injection Inject 20 Units into the skin daily.  5 pen  3  . JANUVIA 100 MG tablet TAKE 1 TABLET BY MOUTH ONCE A DAY  30 tablet  8  . levothyroxine (SYNTHROID, LEVOTHROID) 100 MCG tablet TAKE 1 TABLET BY MOUTH ONCE A DAY  30 tablet  0  . oxaprozin (DAYPRO) 600 MG tablet 1 once daily as needed for joint pain take with food.  30 tablet  5  .  quinapril (ACCUPRIL) 20 MG tablet Take 1 tablet (20 mg total) by mouth at bedtime.  30 tablet  11  . trimethoprim (TRIMPEX) 100 MG tablet Take 1 tablet by mouth At  bedtime.      . vitamin B-12 (CYANOCOBALAMIN) 100 MCG tablet Take 50 mcg by mouth daily.        Marland Kitchen ZETIA 10 MG tablet TAKE 1 TABLET BY MOUTH EVERY DAY  30 tablet  0    Allergies  Allergen Reactions  . Sulfonamide Derivatives     Review of Systems         See HPI - all other systems neg except as noted... The patient complains of headaches.  The patient denies anorexia, fever, weight loss, weight gain, vision loss, decreased hearing, hoarseness, chest pain, syncope, dyspnea on exertion, peripheral edema, prolonged cough, hemoptysis, abdominal pain, melena, hematochezia, severe indigestion/heartburn, hematuria, incontinence, muscle weakness, suspicious skin lesions, transient blindness, difficulty walking, depression, unusual weight change, abnormal bleeding, enlarged lymph nodes, and angioedema.     Objective:   Physical Exam      WD, WN, 76 y/o WF in NAD... GENERAL:  Alert & oriented; pleasant & cooperative... HEENT:  [place]/AT, EOM-wnl, PERRLA, EACs-clear, TMs-wnl, NOSE-clear, THROAT-clear & wnl. NECK:  Supple w/ fairROM; no JVD; normal carotid impulses w/o bruits; no thyromegaly or nodules palpated; no lymphadenopathy. CHEST:  Clear to P & A; without wheezes/ rales/ or rhonchi. HEART:  Regular Rhythm; without murmurs/ rubs/ or gallops. ABDOMEN:  Soft & nontender; normal bowel sounds; no organomegaly or masses detected, no guarding or rebound, neg CVA tenderness  EXT: without deformities, mild arthritic changes; no varicose veins/ venous insuffic/ or edema. NEURO:  CN's intact; motor testing normal; sensory testing normal; gait normal & balance OK. DERM:  No lesions noted; no rash [email or web address]  RADIOLOGY DATA:  Reviewed in the EPIC EMR & discussed w/ the patient...  LABORATORY DATA:  Reviewed in the EPIC EMR & discussed w/ the patient...   Assessment & Plan:   HBP>  Controlled on Accupril20, continue same + diet & no sodium...  CP/ FM>  She has had atypCP as part of her severe FM  problem;  Recently stable w/o pain exac & doing satis on Daypro/ Tylenol/ etc...  Hx AFib>  Holding NSR, remains regular w/o CP, palpit, racing, etc... She knows to avoid caffeine etc...  Cerebrovasc dis>  Stable on ASA without cerebral ischemic symptoms; CDopplers followed at the [facility] lab & showed progression on last check 4/13, f/u planned 76mo.  Hyperlipid>  Nice response to Zetia 10mg /d> unfortunately she cannot tolerate & refuses to try any other chol lowering med; she wants to use ginger pills and flax seed cookies rec by "the lady in [place]"...  DM>  This remains suboptimal but improved w/ addition of LANTUS20; we discussed maximizing the Glucovance/ Januvia & continue diet, exercise, monitoring...  Vit D defic>  She really needs to take the supplement, and asked to incr to 2000 u daily...  Anxiety>  She likes the Lexapro & gets $$ help w/ this & wants to continue...  NOTE>  was spent reviewing data from mult providers, discussing Rx & options w/ pt today; all questions answered...   Patient's Medications  New Prescriptions   No medications on file  Previous Medications   B COMPLEX VITAMINS (VITAMIN-B COMPLEX PO)    Take 1 tablet by mouth daily.     CHOLECALCIFEROL (VITAMIN D) 1000 UNITS TABLET    Take 2 Units by  mouth daily.     ESCITALOPRAM (LEXAPRO) 20 MG TABLET    TAKE 1 TABLET BY MOUTH ONCE A DAY   GLUCOSE BLOOD TEST STRIP    1 each by Other route as needed. Use as instructed check blood sugars once daily    INSULIN GLARGINE (LANTUS SOLOSTAR) 100 UNIT/ML INJECTION    Inject 20 Units into the skin daily.   JANUVIA 100 MG TABLET    TAKE 1 TABLET BY MOUTH ONCE A DAY   LEVOTHYROXINE (SYNTHROID, LEVOTHROID) 100 MCG TABLET    TAKE 1 TABLET BY MOUTH ONCE A DAY   OXAPROZIN (DAYPRO) 600 MG TABLET    1 once daily as needed for joint pain take with food.   QUINAPRIL (ACCUPRIL) 20 MG TABLET    Take 1 tablet (20 mg total) by mouth at bedtime.   TRIMETHOPRIM (TRIMPEX) 100 MG  TABLET    Take 1 tablet by mouth At bedtime.   VITAMIN B-12 (CYANOCOBALAMIN) 100 MCG TABLET    Take 50 mcg by mouth daily.     ZETIA 10 MG TABLET    TAKE 1 TABLET BY MOUTH EVERY DAY  Modified Medications   Modified Medication Previous Medication   GLYBURIDE-METFORMIN (GLUCOVANCE) 5-500 MG PER TABLET glyBURIDE-metformin (GLUCOVANCE) 5-500 MG per tablet      Take 2 tablets by mouth 2 (two) times daily with a meal.    Take 2 tablets by mouth in the morning and 1 tablet by mouth at dinner  Discontinued Medications   No medications on file

## 2011-10-13 NOTE — Patient Instructions (Addendum)
Today we updated your med list in our EPIC system...    Continue your current medications the same, but INCREASE the GLUCVANCE (Glyburide/ Metformin) to 2Tabs twice daily...    Continue the Lantus 20u daily...  Let's plan a follow up visit in 2 months w/ blood work at that time....  Call for any problems.Marland KitchenMarland Kitchen

## 2011-10-27 ENCOUNTER — Other Ambulatory Visit: Payer: Self-pay | Admitting: Pulmonary Disease

## 2011-10-31 ENCOUNTER — Telehealth: Payer: Self-pay | Admitting: Pulmonary Disease

## 2011-10-31 MED ORDER — GLYBURIDE-METFORMIN 5-500 MG PO TABS: 2.0000 | ORAL_TABLET | Freq: Two times a day (BID) | ORAL | Status: DC

## 2011-10-31 NOTE — Telephone Encounter (Signed)
I spoke with pt and is aware RX has been sent. Nothing further was needed.  

## 2011-11-12 ENCOUNTER — Other Ambulatory Visit: Payer: Self-pay | Admitting: Pulmonary Disease

## 2011-12-10 ENCOUNTER — Other Ambulatory Visit: Payer: Self-pay | Admitting: Gynecology

## 2011-12-10 DIAGNOSIS — R921 Mammographic calcification found on diagnostic imaging of breast: Secondary | ICD-10-CM

## 2011-12-12 ENCOUNTER — Other Ambulatory Visit: Payer: Self-pay | Admitting: Pulmonary Disease

## 2011-12-22 ENCOUNTER — Encounter: Payer: Self-pay | Admitting: *Deleted

## 2011-12-23 ENCOUNTER — Ambulatory Visit: Payer: Medicare Other | Admitting: Pulmonary Disease

## 2012-01-12 ENCOUNTER — Other Ambulatory Visit: Payer: Self-pay | Admitting: Pulmonary Disease

## 2012-01-19 ENCOUNTER — Encounter: Payer: Self-pay | Admitting: *Deleted

## 2012-01-20 ENCOUNTER — Encounter: Payer: Self-pay | Admitting: Pulmonary Disease

## 2012-01-20 ENCOUNTER — Ambulatory Visit (INDEPENDENT_AMBULATORY_CARE_PROVIDER_SITE_OTHER): Payer: Medicare Other | Admitting: Pulmonary Disease

## 2012-01-20 ENCOUNTER — Other Ambulatory Visit (INDEPENDENT_AMBULATORY_CARE_PROVIDER_SITE_OTHER): Payer: Medicare Other

## 2012-01-20 ENCOUNTER — Other Ambulatory Visit: Payer: Self-pay | Admitting: *Deleted

## 2012-01-20 VITALS — BP 124/78 | HR 88 | Temp 97.7°F | Ht 63.0 in | Wt 151.0 lb

## 2012-01-20 DIAGNOSIS — E119 Type 2 diabetes mellitus without complications: Secondary | ICD-10-CM

## 2012-01-20 DIAGNOSIS — Z23 Encounter for immunization: Secondary | ICD-10-CM

## 2012-01-20 DIAGNOSIS — IMO0001 Reserved for inherently not codable concepts without codable children: Secondary | ICD-10-CM

## 2012-01-20 DIAGNOSIS — I10 Essential (primary) hypertension: Secondary | ICD-10-CM

## 2012-01-20 DIAGNOSIS — M1990 Unspecified osteoarthritis, unspecified site: Secondary | ICD-10-CM

## 2012-01-20 DIAGNOSIS — I6529 Occlusion and stenosis of unspecified carotid artery: Secondary | ICD-10-CM

## 2012-01-20 DIAGNOSIS — I679 Cerebrovascular disease, unspecified: Secondary | ICD-10-CM

## 2012-01-20 DIAGNOSIS — E785 Hyperlipidemia, unspecified: Secondary | ICD-10-CM

## 2012-01-20 DIAGNOSIS — F341 Dysthymic disorder: Secondary | ICD-10-CM

## 2012-01-20 DIAGNOSIS — E559 Vitamin D deficiency, unspecified: Secondary | ICD-10-CM

## 2012-01-20 DIAGNOSIS — K5730 Diverticulosis of large intestine without perforation or abscess without bleeding: Secondary | ICD-10-CM

## 2012-01-20 DIAGNOSIS — D126 Benign neoplasm of colon, unspecified: Secondary | ICD-10-CM

## 2012-01-20 DIAGNOSIS — I4891 Unspecified atrial fibrillation: Secondary | ICD-10-CM

## 2012-01-20 DIAGNOSIS — E039 Hypothyroidism, unspecified: Secondary | ICD-10-CM

## 2012-01-20 LAB — BASIC METABOLIC PANEL
BUN: 17 mg/dL (ref 6–23)
Calcium: 9.6 mg/dL (ref 8.4–10.5)
Creatinine, Ser: 0.8 mg/dL (ref 0.4–1.2)
GFR: 72.34 mL/min (ref >60.00)
Glucose, Bld: 139 mg/dL — ABNORMAL HIGH (ref 70–99)

## 2012-01-20 NOTE — Progress Notes (Addendum)
HPI Review of Systems Physical Exam Subjective:    Patient ID: Jaclyn [name], female    DOB: Aug 28, 1935, 76 y.o.   MRN: [id number]  HPI 75 y/o WF here for a follow up visit... she has multiple medical problems as noted below- followed for HBP, PAF, Carotid disease, Hyperchol, DM, Hypothy, FM, Vit D defic, anxiety/ depression... she is also followed by Jaclyn [name] for Cardiology, and Jaclyn [name] for GI.Marland Kitchen.  ~  February 15, 2010:  19 mo ROV- doing fair> she's had repeated bladder infections & saw Jaclyn [name] 9/11- Cipro w/o help but she notes that Doxy worked well... BP controlled on Mavik but she notes that the generic "depresses me" but she doesn't want to change meds- just wants the non-generic rx...  she had f/u Cards 12/11- Jaclyn [name], no signif CAD, atypCP, f/u CDopplers w/ mod plaque & bilat ICA stenoses- stable...  she has been trying to control Lipids w/ diet alone but unsuccessful & she wants Zetia trial... BS poorly controlled but she is not on diet & needs to incr meds (see below)... she notes heartburn but states that daily "mustard" really helps...  we discussed diet + exercise & need for more regular f/u visits.  ~  June 10, 2010:  52mo ROV & stable, just incr stress w/ 52 y/o daughter's alcoholism and health issues;  She had labs done last week and FLP improved on the Zetia, but BS/ A1c about the same (on Glucovance & Januvia);  BP controlled & she denies CP/ palpit, etc > see prob list below>  ~  October 13, 2011:  49mo ROV & Jaclyn [name] was started on LANTUS 20u/d by TP last month when her BS=364 & A1c=9.7 on her Glucovance/ Januvia meds;  Since then her BS has improved & review of her meter shows BS 120-190 range;  We discussed maximizing the Glucovance 5/500 to 2Bid & continue the Januvia100 & Lantus20; we plan f/u OV & labs in 2 months;  We reviewed low carb diet & exercise program...    She saw Jaclyn [name] last 1 yr ago for f/u of her HBP, Lipids, Carotid dis> hx normal cath in 2008, denies CP/  palpit/ SOB/ cerebral ischemic symptoms; no changes made.    Follow up CDopplers done last 4/13 & showed mod heterogeneous plaque bilat, 60-79% bilat ICAstenoses, patent vertebrals, f/u in 11mo...    She had numerous f/u visits w/ TP this past yr> UTI, problems w/ Zetia, she stopped her Synthroid (?why), nail fungus; all updated below...    She saw Jaclyn [name] for Urology last 6/12> dysuria, hx IC, recurrent UTIs, mild urge incont; treated w/ Trimethoprim 100mg  Qhs which really helps & no infections this yr. We reviewed prob list, meds, xrays and labs> see below for updates >>  ~  January 20, 2012:  55mo ROV & Jaclyn [name] states that her home BS have been improved on the Lantus20... We reviewed the following medical problems during today's office visit>>     HBP> on Accupril20; BP= 124/78 & she denies HA, CP, palpit, SOB, edema, etc...    AtypCP> reminded to take ASA81mg /d; she denies CP but is too sedentary & reminded to incr exercise program...    PAF> followed by Jaclyn [name] for Cards; holding NSR & denies CP, palpit, dizzy, etc...    Cerebrovasc Dis> should be taking ASA81mg /d; due for f/u CDopplers- she denies cerebral ischemic symptoms...     CHOL> on Zetia10 but only taking it 1-2 per week; she refuses statins or the [facility]; last FLP 4/13  on diet alone showed TChol 319, TG 345, HDL 54, LDL 213; she is not fasting today & says she will recheck her FLP when she sees Jaclyn [name] soon...     DM> on Glucov5/500-2Bid, Januv100, Lantus20; labs show BS=139, A1c=8.5 (improved from 9.7); Rec to incr Lantus to 25u/d...    Hypothy> on Synthroid100; she is clinically euthyroid & her last TSH 7/13 = 2.71...    GI- Divertics, Polyps> she has villous adenoma in cecum removed via right hemicolectomy 6/05; last colonoscopy was 10/09 by Jaclyn [name]- no recurrent polyp, f/u 44yrs.    GU- UTIs> on [id number]; followed by Jaclyn [name], doing satis w/o recurrent infections...     DJD, FM> on MVI, VitD1000; she uses OTC analgesics  as needed...    B12 Defic> on B12500/d; last B12 level was 476 Apr'13...    Anxiety, Depression> on Lexapro20; she feels that she is doing well on this med... We reviewed prob list, meds, xrays and labs> see below for updates >> OK Flu shot today... LABS 12/13:  [facility]- ok w/ BS=139, A1c=8.5 >> on Lantus20 & rec to incr to 25u...         Problem List:  HYPERTENSION (ICD-401.9) - prev on Mavik 2mg /d but switched to ACCUPRIL 20mg /d to save $$ ~  12/11:  BP= 118/74 on Mavik2 & feeling well, takes med regularly and tolerates well... denies visual changes, CP, palipit, dizziness, syncope, dyspnea, edema, etc...  ~  4/12:  BP= 134/64 & she continues to be essent asymptomatic... ~  8/13:  BP= 124/68 on Quinapril20 now; denies HA, cough, CP, palpit, SOB, edema, etc... ~  12/13: on Accupril20; BP= 124/78 & she denies HA, CP, palpit, SOB, edema, etc...   CHEST PAIN, ATYPICAL (ICD-786.59) - she had cath for atyp CP 1/08 showing no signif CAD, EF= 60%... prev CWP part of her FM complex... ~  She has MULT coronary risk factors w/ HBP, DM, Chol, & known carotid vasc dis... ~  CXR 12/11 showed normal heart size, mild peribronch thickening, clear lungs, NAD...  PAROXYSMAL ATRIAL FIBRILLATION (ICD-427.31) - followed by Jaclyn [name] & his notes are reviewed... she is holding NSR without palpit or arrhythmias noted... ~  2DEcho 5/05 was normal- no valve dis, normal wall thickness, norm EF...  CEREBROVASCULAR DISEASE (ICD-437.9) - on ASA 81mg /d... she has a right carotid bruit and bilat carotid stenoses> denies cerebral ischemic symptoms... ~  CDopplers 11/09 showed mod plaque in the prox ICAs bilat R>L, 60-79%R & 40-59%L, stable- no change going back to 2005 studies. ~  5/10:  reminded to take her ASA daily. ~  f/u CDopplers 12/11 showed mod plaque, stable bilat carotid dis w/ 60-79% RICA & 40-59% LICA stenoses. ~  f/u CDopplers 4/13 showed mod heterogeneous plaque bilat, 60-79% bilat ICAstenoses, patent  vertebrals, f/u in 63mo... ~  12/13: she is due to f/u CDopplers but wants to wait til f/u appt w/ Jaclyn [name] soon...  HYPERLIPIDEMIA (ICD-272.4) - on Zetia 10mg  +diet... she has refused Statin Rx- "It makes my bones hurt so bad"> went to the [facility] but states she didn't tol Crestor5 "I hurt so bad- it's diff than my FM" & discomfort resolved off Rx... then tried FISH OIL & titrated up to 3/d but her f/u FLP was no better... ~  FLP 12/07 showed TChol 192, TG 172, HDL 38, LDL 120... on diet alone... pt refuses Statin Rx. ~  FLP 9/09 showed TChol 230, TG 203, HDL 38, LDL 176... rec- refer to [facility] ~  FLP  5/10 on FishOil showed TChol 231, TG 145, HDL 41, LDL 169 ~  FLP 12/11 showed TChol 244, TG 278, HDL 42, LDL 176...  try ZETIA 10mg /d. ~  FLP 4/12 on Zetia10 showed TChol 170, TG 175, HDL 41, LDL 94... Continue Zetia & diet... ~  Pt says she developed increased aching, soreness, pain & cut back the Zetia to MWF, then stopped it on her own, also stopped her Synthroid on her own... ~  FLP 4/13 off meds showed TChol 319, TG 345, HDL 54, LDL 213... her TSH off Synthroid was 55 & she refused Chol meds but agreed to restart SYNTHROID 100/d. ~  8/13: pt says she tried the Zetia again but INTOL "it made me feel like I have dementia"; she is not fasting today> she mentioned that a lady at [facility] recommended "ginger pills and flax seed cookies" so she wants to try these next... ~  12/13: she admits to only taking the Zetia 1-2d per week; states she will get f/u FLP from [facility] soon...   DIABETES MELLITUS (ICD-250.00) - now on GLUCOVANCE 5-500 2AM & 1PM and JANUVIA 100mg /d... ~  labs 2/09 showed BS= 180, HgA1c= 7.8.Marland KitchenMarland Kitchen on Glucov 2.5-500 taking 1 daily. ~  labs 9/09 showed BS= 219, HgA1c= 7.4... she ignored advise to incr to Bid; Januvia100 added. ~  labs 5/10 still on one of each (wt down 7# to 148#) showed BS= 167, A1c= 6.9 ~  labs 12/11 showed BS= 162, A1c= 8.6.Marland KitchenMarland Kitchen rec to incr Glucov5/500 Bid +  Januv100/d. ~  Labs 4/12 showed BS=160, A1c=8.4.Marland KitchenMarland Kitchen rec incr to Glucov5/500- 2AM, 1PM; +Januv100/d. ~  Labs 4/13 showed BS= 281, A1c=9.4... But she had stopped mult meds including Synthroid=> told to restart meds. ~  Labs 7/13 showed BS= 364, A1c= 9.7.Marland KitchenMarland Kitchen Pt started on LANTUS 20u daily, and subseq Glucovance incr to 2Bid.  HYPOTHYROIDISM (ICD-244.9) - on SYNTHROID 121mcg/d...  ~  labs 11/08 showed TSH= 10.98... ?if taking med regularly? ~  labs 9/09 showed TSH= 0.63... rec- continue same dose. ~  labs 5/10 showed TSH= 0.49 ~  labs 12/11 showed TSH= 3.21 ~  Labs 4/13 showed TSH= 55... Pt had stopped Synthroid on her own. ~  Labs 7/13 on Synth100 showed TSH= 2.71  DIVERTICULOSIS OF COLON (ICD-562.10) & COLONIC POLYPS (ICD-211.3) - followed by Jaclyn [name]... last colonoscopy 3/05 w divertics and cecal villous adenoma- referred to DrStreck w/ right colectomy 6/05 (no cancer in specimen)... ~  Follow up colonoscopy 10/09 by Jaclyn [name]> patent end-to-end ileo-colonic anastomosis, divertics in desc & sigmoid regions, +hemorrhoids; f/u 5 yrs...  SEBACEOUS CYST, BREAST (ICD-610.8) - infected seb cyst left chest wall treated in 2009 w/ Keflex & resolved.  DEGENERATIVE JOINT DISEASE (ICD-715.90) - Eval and Rx by DrRamos in 2007... calcif tendonitis in shoulders, DDD in CSpine... given Naprosyn & phys therapy... currently taking DAYPRO 600mg /d Prn...  FIBROMYALGIA (ICD-729.1) - on Daypro as noted... not interested in Lyrica/ Savella/ Rheum consult... ~  She has chronic pain & trigger points assoc w/ the FM...  VITAMIN D DEFICIENCY (ICD-268.9) - she has had a very low Vit D level = 9 on 7/08 & 9/09... she was perscribed Vit D 50,000 u weekly but apparently never took this medication... we wrote it for her again w/ instructions to take it weekly until we tell her to stop, but she only took it for 4 weeks then stopped on her own in favor of 1000 u daily OTC supplement... ~  BMD 7/08 was WNL-  TScore in  Spine=  +0.6,  TScore in Hips= -0.8.Marland Kitchen. ~   f/u Vit D level 5/10= 29, therefore continue the Vit D 1000/d. ~  F/u Vit D level 4/12 = 29 & she admits not taking the OTC supplement... rec incr to 2000u daily. ~  Labs 4/13 showed Vit D level = 18 & she is reminded to take the supplement every day...   BORDERLINE B 12 level > Vit B12 level 5/10 = 267 (Hg= 12.6, MCV= 89)... rec to start Vit B12 supplement 100-500 micrograms/d w/ f/u level on return... ~  Labs 4/13 showed B12 level = 476... Continue same.  ANXIETY DEPRESSION (ICD-300.4) - on LEXAPRO 20mg /d & notes that this really helps... INSOMNIA (ICD-780.52)   Past Surgical History  Procedure Date  . Vesicovaginal fistula closure w/ tah 1990    w/ cystocele &retocele repairs Dr. Elana Alm  . Right colectomy 2005    for villous adenoma of the cecum Dr.streck  . Cardiac catheterization 02/19/06    EF 60%    Outpatient Encounter Prescriptions as of 01/20/2012  Medication Sig Dispense Refill  . B Complex Vitamins (VITAMIN-B COMPLEX PO) Take 1 tablet by mouth daily.        . cholecalciferol (VITAMIN D) 1000 UNITS tablet Take 2,000 Units by mouth daily.       Marland Kitchen escitalopram (LEXAPRO) 20 MG tablet TAKE 1 TABLET BY MOUTH ONCE A DAY  30 tablet  5  . glucose blood test strip 1 each by Other route as needed. Use as instructed check blood sugars once daily       . glyBURIDE-metformin (GLUCOVANCE) 5-500 MG per tablet Take 2 tablets by mouth 2 (two) times daily with a meal.  120 tablet  5  . JANUVIA 100 MG tablet TAKE 1 TABLET BY MOUTH ONCE A DAY  30 tablet  8  . levothyroxine (SYNTHROID, LEVOTHROID) 100 MCG tablet TAKE 1 TABLET BY MOUTH ONCE A DAY  30 tablet  5  . quinapril (ACCUPRIL) 20 MG tablet Take 1 tablet (20 mg total) by mouth at bedtime.  30 tablet  11  . trimethoprim (TRIMPEX) 100 MG tablet Take 1 tablet by mouth At bedtime.      . vitamin B-12 (CYANOCOBALAMIN) 100 MCG tablet Take 50 mcg by mouth daily.        Marland Kitchen ZETIA 10 MG tablet TAKE 1 TABLET BY MOUTH  EVERY DAY  30 tablet  0  . [DISCONTINUED] insulin glargine (LANTUS SOLOSTAR) 100 UNIT/ML injection Inject 20 Units into the skin daily.  5 pen  3  . [DISCONTINUED] ZETIA 10 MG tablet TAKE 1 TABLET BY MOUTH EVERY DAY  30 tablet  6    Allergies  Allergen Reactions  . Sulfonamide Derivatives     Review of Systems         See HPI - all other systems neg except as noted... The patient complains of headaches.  The patient denies anorexia, fever, weight loss, weight gain, vision loss, decreased hearing, hoarseness, chest pain, syncope, dyspnea on exertion, peripheral edema, prolonged cough, hemoptysis, abdominal pain, melena, hematochezia, severe indigestion/heartburn, hematuria, incontinence, muscle weakness, suspicious skin lesions, transient blindness, difficulty walking, depression, unusual weight change, abnormal bleeding, enlarged lymph nodes, and angioedema.     Objective:   Physical Exam      WD, WN, 76 y/o WF in NAD... GENERAL:  Alert & oriented; pleasant & cooperative... HEENT:  [place]/AT, EOM-wnl, PERRLA, EACs-clear, TMs-wnl, NOSE-clear, THROAT-clear & wnl. NECK:  Supple w/ fairROM; no JVD; normal carotid  impulses w/o bruits; no thyromegaly or nodules palpated; no lymphadenopathy. CHEST:  Clear to P & A; without wheezes/ rales/ or rhonchi. HEART:  Regular Rhythm; without murmurs/ rubs/ or gallops. ABDOMEN:  Soft & nontender; normal bowel sounds; no organomegaly or masses detected, no guarding or rebound, neg CVA tenderness  EXT: without deformities, mild arthritic changes; no varicose veins/ venous insuffic/ or edema. NEURO:  CN's intact; motor testing normal; sensory testing normal; gait normal & balance OK. DERM:  No lesions noted; no rash [email or web address]  RADIOLOGY DATA:  Reviewed in the EPIC EMR & discussed w/ the patient...  LABORATORY DATA:  Reviewed in the EPIC EMR & discussed w/ the patient...   Assessment & Plan:    HBP>  Controlled on Accupril20, continue same + diet & no  sodium...  CP/ FM>  She has had atypCP as part of her severe FM problem;  Recently stable w/o pain exac & doing satis on Daypro/ Tylenol/ etc...  Hx AFib>  Holding NSR, remains regular w/o CP, palpit, racing, etc... She knows to avoid caffeine etc...  Cerebrovasc dis>  Stable on ASA without cerebral ischemic symptoms; CDopplers followed at the [facility] & showed progression on last check 4/13, f/u planned 51mo.  Hyperlipid>  Nice response to Zetia 10mg /d> unfortunately she cannot tolerate & refuses to try any other chol lowering med; she wants to use ginger pills and flax seed cookies rec by "the lady in [place]"...  DM>  This remains suboptimal but improved w/ addition of LANTUS20; we discussed incr to 25u & continue diet, exercise, monitoring...  Vit D defic>  She really needs to take the supplement, and asked to incr to 2000 u daily...  Anxiety>  She likes the Lexapro & gets $$ help w/ this & wants to continue...  NOTE>  was spent reviewing data from mult providers, discussing Rx & options w/ pt today; all questions answered...   Patient's Medications  New Prescriptions   No medications on file  Previous Medications   B COMPLEX VITAMINS (VITAMIN-B COMPLEX PO)    Take 1 tablet by mouth daily.     CHOLECALCIFEROL (VITAMIN D) 1000 UNITS TABLET    Take 2,000 Units by mouth daily.    ESCITALOPRAM (LEXAPRO) 20 MG TABLET    TAKE 1 TABLET BY MOUTH ONCE A DAY   GLUCOSE BLOOD TEST STRIP    1 each by Other route as needed. Use as instructed check blood sugars once daily    GLYBURIDE-METFORMIN (GLUCOVANCE) 5-500 MG PER TABLET    Take 2 tablets by mouth 2 (two) times daily with a meal.   JANUVIA 100 MG TABLET    TAKE 1 TABLET BY MOUTH ONCE A DAY   LEVOTHYROXINE (SYNTHROID, LEVOTHROID) 100 MCG TABLET    TAKE 1 TABLET BY MOUTH ONCE A DAY   QUINAPRIL (ACCUPRIL) 20 MG TABLET    Take 1 tablet (20 mg total) by mouth at bedtime.   TRIMETHOPRIM (TRIMPEX) 100 MG TABLET    Take 1 tablet by mouth At  bedtime.   VITAMIN B-12 (CYANOCOBALAMIN) 100 MCG TABLET    Take 50 mcg by mouth daily.     ZETIA 10 MG TABLET    TAKE 1 TABLET BY MOUTH EVERY DAY  Modified Medications   Modified Medication Previous Medication   INSULIN GLARGINE (LANTUS) 100 UNIT/ML INJECTION insulin glargine (LANTUS SOLOSTAR) 100 UNIT/ML injection      Inject 25 Units into the skin daily.    Inject 20 Units into  the skin daily.  Discontinued Medications   ZETIA 10 MG TABLET    TAKE 1 TABLET BY MOUTH EVERY DAY

## 2012-01-20 NOTE — Patient Instructions (Addendum)
Today we updated your med list in our EPIC system...    Continue your current medications the same...  Today we did your follow up metabolic 7 diabetic labs...    We will call you w/ the results...  We also gave you the 2013 flu vaccine...  Stay on your low carb, no sweets, diabetic diet & your exercise program...  Call for any questions...  Let's plan a follow up visit in about 4 months w/ FASTING blood work at that time.Marland KitchenMarland Kitchen

## 2012-01-23 ENCOUNTER — Telehealth: Payer: Self-pay | Admitting: Pulmonary Disease

## 2012-01-23 NOTE — Telephone Encounter (Signed)
Called and spoke with pt and she is aware of lab results per SN. Pt voiced her understanding and nothing further is needed 

## 2012-02-02 ENCOUNTER — Encounter: Payer: Self-pay | Admitting: Cardiovascular Disease

## 2012-02-02 ENCOUNTER — Encounter (INDEPENDENT_AMBULATORY_CARE_PROVIDER_SITE_OTHER): Payer: Medicare Other

## 2012-02-02 ENCOUNTER — Ambulatory Visit (INDEPENDENT_AMBULATORY_CARE_PROVIDER_SITE_OTHER): Payer: Medicare Other | Admitting: Cardiovascular Disease

## 2012-02-02 VITALS — BP 114/60 | HR 80 | Resp 15 | Ht 63.0 in | Wt 152.0 lb

## 2012-02-02 DIAGNOSIS — I10 Essential (primary) hypertension: Secondary | ICD-10-CM

## 2012-02-02 DIAGNOSIS — R0989 Other specified symptoms and signs involving the circulatory and respiratory systems: Secondary | ICD-10-CM

## 2012-02-02 DIAGNOSIS — I4891 Unspecified atrial fibrillation: Secondary | ICD-10-CM

## 2012-02-02 DIAGNOSIS — I6529 Occlusion and stenosis of unspecified carotid artery: Secondary | ICD-10-CM

## 2012-02-02 DIAGNOSIS — E785 Hyperlipidemia, unspecified: Secondary | ICD-10-CM

## 2012-02-02 NOTE — Progress Notes (Signed)
Patient ID: Jaclyn [name], female   DOB: [birth year], 76 y.o.   MRN: [id number] Ellyson is seen today fo rf/U of HTN, elevated lipids previous atypical SSCP and right carotid bruit. She's had a normal cath in 2008. She has known RICA dx. I reviewed her duplex from 12/11 and she has stable 60-79% RICA stenosis (157/44), and 40-59% LICA stenosis. She has had no recurrent SSCP, no TIA like symptoms. She has been compliant with her meds. She is intolerant to statins. She needs to have her cholesterol checked at Dr. Kirstie Mirza. Othewise she is doing well Some fibromyalgia and neuropatthy. No history of PVC or claudication.  Duplex today no change from 12/11 with 60-79% RICA and and 40-59% LICA  F/U duplex in a year  A1c was 10 and started on insulin about 2 months ago and feels a lot better  ROS: Denies fever, malais, weight loss, blurry vision, decreased visual acuity, cough, sputum, SOB, hemoptysis, pleuritic pain, palpitaitons, heartburn, abdominal pain, melena, lower extremity edema, claudication, or rash.  All other systems reviewed and negative  General: Affect appropriate Healthy:  appears stated age HEENT: normal Neck supple with no adenopathy JVP normal right bruits no thyromegaly Lungs clear with no wheezing and good diaphragmatic motion Heart:  S1/S2 no murmur, no rub, gallop or click PMI normal Abdomen: benighn, BS positve, no tenderness, no AAA no bruit.  No HSM or HJR Distal pulses intact with no bruits No edema Neuro non-focal Skin warm and dry No muscular weakness   Current Outpatient Prescriptions  Medication Sig Dispense Refill  . B Complex Vitamins (VITAMIN-B COMPLEX PO) Take 1 tablet by mouth daily.        . cholecalciferol (VITAMIN D) 1000 UNITS tablet Take 2,000 Units by mouth daily.       Marland Kitchen escitalopram (LEXAPRO) 20 MG tablet TAKE 1 TABLET BY MOUTH ONCE A DAY  30 tablet  5  . glucose blood test strip 1 each by Other route as needed. Use as instructed check blood sugars  once daily       . glyBURIDE-metformin (GLUCOVANCE) 5-500 MG per tablet Take 2 tablets by mouth 2 (two) times daily with a meal.  120 tablet  5  . insulin glargine (LANTUS) 100 UNIT/ML injection Inject 25 Units into the skin daily.      Marland Kitchen JANUVIA 100 MG tablet TAKE 1 TABLET BY MOUTH ONCE A DAY  30 tablet  8  . levothyroxine (SYNTHROID, LEVOTHROID) 100 MCG tablet TAKE 1 TABLET BY MOUTH ONCE A DAY  30 tablet  5  . quinapril (ACCUPRIL) 20 MG tablet Take 1 tablet (20 mg total) by mouth at bedtime.  30 tablet  11  . trimethoprim (TRIMPEX) 100 MG tablet Take 1 tablet by mouth At bedtime.      . vitamin B-12 (CYANOCOBALAMIN) 100 MCG tablet Take 50 mcg by mouth daily.        Marland Kitchen ZETIA 10 MG tablet TAKE 1 TABLET BY MOUTH EVERY DAY  30 tablet  0    Allergies  Sulfonamide derivatives  Electrocardiogram:NSR rate 80 RSAD limb lead reversal  Assessment and Plan

## 2012-02-02 NOTE — Assessment & Plan Note (Signed)
Maint NSR with no palpitations  

## 2012-02-02 NOTE — Assessment & Plan Note (Signed)
Well controlled.  Continue current medications and low sodium Dash type diet.    

## 2012-02-02 NOTE — Assessment & Plan Note (Signed)
Cholesterol is at goal.  Continue current dose of statin and diet Rx.  No myalgias or side effects.  F/U  LFT's in 6 months. Lab Results  Component Value Date   LDLCALC 94 06/06/2010

## 2012-02-02 NOTE — Patient Instructions (Signed)
Your physician wants you to follow-up in:  YEAR WITH DR Eden Emms AND CAROTID SAME DAY   You will receive a reminder letter in the mail two months in advance. If you don't receive a letter, please call our office to schedule the follow-up appointment. Your physician recommends that you continue on your current medications as directed. Please refer to the Current Medication list given to you today.

## 2012-02-02 NOTE — Assessment & Plan Note (Signed)
STable 60-79% RICA stenosis ASA F/U duplex in a year

## 2012-02-03 ENCOUNTER — Ambulatory Visit
Admission: RE | Admit: 2012-02-03 | Discharge: 2012-02-03 | Disposition: A | Discharge status: Home / Self Care | Payer: Medicare Other | Source: Ambulatory Visit | Attending: Gynecology | Admitting: Gynecology

## 2012-02-03 ENCOUNTER — Other Ambulatory Visit: Payer: Self-pay | Admitting: Gynecology

## 2012-02-03 DIAGNOSIS — R921 Mammographic calcification found on diagnostic imaging of breast: Secondary | ICD-10-CM

## 2012-02-16 ENCOUNTER — Ambulatory Visit
Admission: RE | Admit: 2012-02-16 | Discharge: 2012-02-16 | Disposition: A | Discharge status: Home / Self Care | Payer: Medicare Other | Source: Ambulatory Visit | Attending: Gynecology | Admitting: Gynecology

## 2012-02-16 ENCOUNTER — Other Ambulatory Visit: Payer: Self-pay | Admitting: Diagnostic Radiology

## 2012-02-16 DIAGNOSIS — R921 Mammographic calcification found on diagnostic imaging of breast: Secondary | ICD-10-CM

## 2012-03-05 ENCOUNTER — Telehealth: Payer: Self-pay | Admitting: Pulmonary Disease

## 2012-03-05 MED ORDER — AZITHROMYCIN 250 MG PO TABS: 250.0000 mg | ORAL_TABLET | ORAL | Status: DC

## 2012-03-05 NOTE — Telephone Encounter (Signed)
Pt called back and she is aware of TP recs.  Pt is aware of meds sent to her pharmacy.  Nothing further is needed.

## 2012-03-05 NOTE — Telephone Encounter (Signed)
Z-Pak take as directed. #1 no refills. Mucinex DM twice daily As needed  cough, congestion  Fluids and rest. Tylenol as needed. Follow up in office if not improving Please contact office for sooner follow up if symptoms do not improve or worsen or seek emergency care

## 2012-03-05 NOTE — Telephone Encounter (Signed)
I have called the pt x 2 and NA, unable to leave a msg since her mailbox was full I went ahead and sent in her zpack  [facility]

## 2012-03-05 NOTE — Telephone Encounter (Signed)
Called and spoke with pt and she stated that she aches all over, sinus pressure, ear pain, runny nose, sore throat, and feels like its all in her chest.  Non- productive cough at this time.  Pt denies any fever.   Pt is requesting that something be called in to her pharmacy.  TP  please advise. Thanks  Allergies  Allergen Reactions  . Sulfonamide Derivatives

## 2012-03-15 ENCOUNTER — Other Ambulatory Visit: Payer: Self-pay | Admitting: Pulmonary Disease

## 2012-04-12 ENCOUNTER — Other Ambulatory Visit: Payer: Self-pay | Admitting: Adult Health

## 2012-04-21 ENCOUNTER — Telehealth: Payer: Self-pay | Admitting: Pulmonary Disease

## 2012-04-21 NOTE — Telephone Encounter (Signed)
SN please advise if ok to refill the daypro.  This is currently not on the pts med list and was last filled 12/2010.    Please advise. Thanks

## 2012-04-22 MED ORDER — OXAPROZIN 600 MG PO TABS: ORAL_TABLET | ORAL | Status: DC

## 2012-04-22 NOTE — Telephone Encounter (Signed)
daypro 600 mg rx sent to [facility] -- lmomtcb to inform pt.

## 2012-04-22 NOTE — Telephone Encounter (Signed)
Per SN-Daypro is not on her med list; find out what she wants it for please.

## 2012-04-22 NOTE — Telephone Encounter (Signed)
Patient takes this for arthritis--for her legs. Patient states that she as some but they expired x 1 year. She states she does not take it all the time--just PRN. Oxaprozin 600mg   1 qd  According to historical med list it was on her med list at one point of time and pt states that she did not know it was removed. (see below)  oxaprozin (DAYPRO) 600 MG tablet (Discontinued) 12/20/2010  Sig - Route: Take 1,200 mg by mouth. 1 once daily as needed joint pain with food. Oral Class: Historical Med  Patient states that she used to only take 1 tablet daily.  Allergies  Allergen Reactions  . Sulfonamide Derivatives

## 2012-04-22 NOTE — Telephone Encounter (Signed)
Okay to refill with same sig as 12-2010.

## 2012-04-26 NOTE — Telephone Encounter (Signed)
lmtcb x2 for pt. 

## 2012-04-29 NOTE — Telephone Encounter (Signed)
I verified with pharmacy that the pt picked up rx on 04-22-12. Carron Curie, CMA

## 2012-05-24 ENCOUNTER — Other Ambulatory Visit: Payer: Self-pay | Admitting: Pulmonary Disease

## 2012-07-12 ENCOUNTER — Other Ambulatory Visit: Payer: Self-pay | Admitting: Pulmonary Disease

## 2012-07-22 ENCOUNTER — Telehealth: Payer: Self-pay | Admitting: Pulmonary Disease

## 2012-07-22 DIAGNOSIS — N3090 Cystitis, unspecified without hematuria: Secondary | ICD-10-CM

## 2012-07-22 NOTE — Telephone Encounter (Signed)
Needs to leave a urine specimen  u dip/micro and cx  Please contact office for sooner follow up if symptoms do not improve or worsen or seek emergency care

## 2012-07-22 NOTE — Telephone Encounter (Signed)
I spoke with pt and is aware of TP recs. Orders have been placed. Pt will come on Monday.

## 2012-07-22 NOTE — Telephone Encounter (Signed)
I spoke with pt. She feels like she might have a bladder infection and also wants to discuss some other issues with TP. She stated her bladder hurts at times when she is standing and she has no energy at all. She has no painful urination. Pt is wanting to be worked in to see TP. Please advise JJ and TP thanks

## 2012-07-27 ENCOUNTER — Other Ambulatory Visit: Payer: Self-pay | Admitting: Pulmonary Disease

## 2012-08-11 ENCOUNTER — Telehealth: Payer: Self-pay | Admitting: Pulmonary Disease

## 2012-08-11 ENCOUNTER — Other Ambulatory Visit: Payer: Self-pay | Admitting: Pulmonary Disease

## 2012-08-11 MED ORDER — TRIMETHOPRIM 100 MG PO TABS: 100.0000 mg | ORAL_TABLET | Freq: Every day | ORAL | Status: DC

## 2012-08-11 MED ORDER — ESCITALOPRAM OXALATE 20 MG PO TABS: ORAL_TABLET | ORAL | Status: DC

## 2012-08-11 NOTE — Telephone Encounter (Signed)
Per Teofilo Pod to give patient 1 time of each and have her keep OV in July 2014 with SN. Pt aware that Rx's have been sent to [facility].

## 2012-09-12 ENCOUNTER — Other Ambulatory Visit: Payer: Self-pay | Admitting: Pulmonary Disease

## 2012-09-14 ENCOUNTER — Ambulatory Visit: Payer: Medicare Other | Admitting: Pulmonary Disease

## 2012-09-17 ENCOUNTER — Telehealth: Payer: Self-pay | Admitting: Pulmonary Disease

## 2012-09-17 ENCOUNTER — Other Ambulatory Visit: Payer: Self-pay | Admitting: Pulmonary Disease

## 2012-09-17 MED ORDER — TRIMETHOPRIM 100 MG PO TABS: 100.0000 mg | ORAL_TABLET | Freq: Every day | ORAL | Status: DC

## 2012-09-17 NOTE — Telephone Encounter (Signed)
rx for both meds have been sent to the pharmacy per pts request.  Pt has a pending appt with SN on 8-22.  i have called and lmom to make her aware of refills that have been sent to the pharmacy and to remind her of the appt with SN.

## 2012-10-01 ENCOUNTER — Telehealth: Payer: Self-pay | Admitting: Gastroenterology

## 2012-10-01 ENCOUNTER — Encounter: Payer: Self-pay | Admitting: Gastroenterology

## 2012-10-01 ENCOUNTER — Telehealth: Payer: Self-pay | Admitting: Pulmonary Disease

## 2012-10-01 DIAGNOSIS — K649 Unspecified hemorrhoids: Secondary | ICD-10-CM

## 2012-10-01 DIAGNOSIS — R197 Diarrhea, unspecified: Secondary | ICD-10-CM

## 2012-10-01 NOTE — Telephone Encounter (Signed)
Spoke to pt. States that she has had diarrhea x1 month. Reports heartburn/indigestion, bleeding hemmorrhoids. She is curious if she needs to go back to GI.  Per SN - refer back to GI  Pt is aware of SN recs. Order will be placed.

## 2012-10-01 NOTE — Telephone Encounter (Signed)
(  continued)  Pt is asking to speak w/ TP about what to do w/ this.  Does she need to be referred to GI again or what?  2.  t had trouble finding Pamelia Hoit, NP for gynecology-I provided pt with the number I found in [facility], [phone].  Antionette Fairy

## 2012-10-01 NOTE — Telephone Encounter (Signed)
Scheduled for 10/05/12 11:00 with Doug Sou, PA

## 2012-10-05 ENCOUNTER — Encounter: Payer: Self-pay | Admitting: Internal Medicine

## 2012-10-05 ENCOUNTER — Ambulatory Visit (INDEPENDENT_AMBULATORY_CARE_PROVIDER_SITE_OTHER): Payer: Medicare Other | Admitting: Gastroenterology

## 2012-10-05 ENCOUNTER — Encounter: Payer: Self-pay | Admitting: Gastroenterology

## 2012-10-05 VITALS — BP 128/84 | HR 102

## 2012-10-05 DIAGNOSIS — K219 Gastro-esophageal reflux disease without esophagitis: Secondary | ICD-10-CM

## 2012-10-05 DIAGNOSIS — Z860100 Personal history of colon polyps, unspecified: Secondary | ICD-10-CM | POA: Insufficient documentation

## 2012-10-05 DIAGNOSIS — R194 Change in bowel habit: Secondary | ICD-10-CM

## 2012-10-05 DIAGNOSIS — R198 Other specified symptoms and signs involving the digestive system and abdomen: Secondary | ICD-10-CM

## 2012-10-05 DIAGNOSIS — R1013 Epigastric pain: Secondary | ICD-10-CM

## 2012-10-05 MED ORDER — RANITIDINE HCL 150 MG PO TABS: 150.0000 mg | ORAL_TABLET | Freq: Two times a day (BID) | ORAL | Status: DC

## 2012-10-05 NOTE — Progress Notes (Signed)
10/05/2012 Jaclyn [name] [id number] [birth year]   HISTORY OF PRESENT ILLNESS:  Patient is a 77 year old female who is new to our practice.  She presents to our office today with several complaints.  First, about 2 months ago she started experiencing heartburn/indigestion and upper abdominal pain that was burning in nature.  Pain was all across her upper abdomen and under both sides of her ribs.  The symptoms would be relieved by Zantac, but she did not want to continue the medication without seeing a doctor first.  She has been eating spoonfuls of mustard, which has been helping as well.  Pain has resolved, but still gets some indigestion, especially at night.    Also has a history of large polyp that had to be surgically resected (had right colon resection).  Her last colonoscopy in 11/2007 by Dr. Sherin Quarry showed diverticulosis in the sigmoid and descending colon, normal anastomosis, internal and external hemorrhoids.  Repeat was recommended in 5 years from that time.    She says that recently her BM's have been softer (mushy) and more frequent.  Sometimes they are explosive and watery.  She is on metformin (glucovance) for quite some time and also takes trimethoprim daily for a couple of years due to UTI's.  Says that the BM's have been getting better, however.  When she was going frequently her hemorrhoids were getting irritated and she was using vaseline and preparation H so those are better now as well.  Was having some bright red blood with them.    Past Medical History  Diagnosis Date  . Unspecified essential hypertension   . Other chest pain   . Atrial fibrillation   . Cerebrovascular disease, unspecified   . Other and unspecified hyperlipidemia   . Type II or unspecified type diabetes mellitus without mention of complication, not stated as uncontrolled   . Unspecified hypothyroidism   . Diverticulosis of colon (without mention of hemorrhage)   . Benign neoplasm of colon   . Other  specified benign mammary dysplasias   . Urinary tract infection, site not specified   . Osteoarthrosis, unspecified whether generalized or localized, unspecified site   . Myalgia and myositis, unspecified   . Unspecified vitamin D deficiency   . Headache(784.0)   . Dysthymic disorder   . Insomnia, unspecified   . Carotid bruit   . Carotid artery occlusion     60-79% right ICA stenosis   Past Surgical History  Procedure Laterality Date  . Vesicovaginal fistula closure w/ tah  1990    w/ cystocele &retocele repairs Dr. Elana Alm  . Right colectomy  2005    for villous adenoma of the cecum Dr.streck  . Cardiac catheterization  02/19/06    EF 60%    reports that she has never smoked. She has never used smokeless tobacco. She reports that she does not drink alcohol or use illicit drugs. family history includes Allergies in her brother; Fibromyalgia in her sister; Hyperlipidemia in her father and sister; Hypertension in her father, mother, and sister; Lymphoma in her father; Stroke in her father. Allergies  Allergen Reactions  . Sulfonamide Derivatives       Outpatient Encounter Prescriptions as of 10/05/2012  Medication Sig Dispense Refill  . azithromycin (ZITHROMAX Z-PAK) 250 MG tablet Take 1 tablet (250 mg total) by mouth as directed.  6 each  0  . B Complex Vitamins (VITAMIN-B COMPLEX PO) Take 1 tablet by mouth daily.        . cholecalciferol (VITAMIN  D) 1000 UNITS tablet Take 2,000 Units by mouth daily.       Marland Kitchen escitalopram (LEXAPRO) 20 MG tablet TAKE 1 TABLET BY MOUTH ONCE A DAY  30 tablet  0  . glucose blood test strip 1 each by Other route as needed. Use as instructed check blood sugars once daily       . glyBURIDE-metformin (GLUCOVANCE) 5-500 MG per tablet TAKE 2 TABLETS BY MOUTH 2 (TWO) TIMES DAILY WITH A MEAL.  120 tablet  0  . JANUVIA 100 MG tablet TAKE 1 TABLET BY MOUTH ONCE A DAY  30 tablet  8  . LANTUS SOLOSTAR 100 UNIT/ML injection INJECT 20 UNITS INTO THE SKIN DAILY.  1  vial  6  . levothyroxine (SYNTHROID, LEVOTHROID) 100 MCG tablet TAKE 1 TABLET BY MOUTH ONCE A DAY  30 tablet  5  . oxaprozin (DAYPRO) 600 MG tablet 1 once daily as needed for joint pain take with food  30 tablet  0  . quinapril (ACCUPRIL) 20 MG tablet TAKE 1 TABLET BY MOUTH AT BEDTIME  30 tablet  6  . trimethoprim (TRIMPEX) 100 MG tablet Take 1 tablet (100 mg total) by mouth at bedtime.  30 tablet  0  . vitamin B-12 (CYANOCOBALAMIN) 100 MCG tablet Take 50 mcg by mouth daily.        Marland Kitchen ZETIA 10 MG tablet TAKE 1 TABLET BY MOUTH EVERY DAY  30 tablet  0  . insulin glargine (LANTUS) 100 UNIT/ML injection Inject 25 Units into the skin daily.       No facility-administered encounter medications on file as of 10/05/2012.     REVIEW OF SYSTEMS  : All other systems reviewed and negative except where noted in the History of Present Illness.   PHYSICAL EXAM: BP 128/84  Pulse 102  SpO2 96% General: Well developed white female in no acute distress; rambling speech Head: Normocephalic and atraumatic Eyes:  sclerae anicteric, conjunctive pink. Ears: Normal auditory acuity Lungs: Clear throughout to auscultation Heart: Regular rate and rhythm Abdomen: Soft, non-distended. No masses or hepatomegaly noted. Normal bowel sounds.  Mild suprapubic TTP without R/R/G. Rectal: Deferred.  Will be performed at the time of colonoscopy.   Musculoskeletal: Symmetrical with no gross deformities  Skin: No lesions on visible extremities Extremities: No edema  Neurological: Alert oriented x 4, grossly nonfocal Psychological:  Alert and cooperative. Normal mood and affect  ASSESSMENT AND PLAN: -Personal history of colon polyps:  Had surgical resection for a large polyp in the past.  Last colonoscopy was 11/2007.  Will schedule surveillance colonoscopy. -Change in bowels:  Loose bowel movements and somewhat more frequent as well.  Actually improving, but she is on metformin, which can cause diarrhea.  Also had partial  colon resection.  Is on chronic trimethoprim for UTI's as well, which could likely affect bowels. -Hemorrhoids:  Some bleeding when having multiple BM's.  Improved now. -Heartburn/indigestion with upper abdominal discomfort:  Will begin taking Zantac 150 mg BID since this H2 blocker works for her.  Will schedule EGD as well to evaluate since these symptoms are somewhat new/sudden onset.  *The risks, benefits, and alternatives of procedures were discussed with the patient and she consents to proceed.

## 2012-10-05 NOTE — Patient Instructions (Signed)
We sent a prescription to [facility] for the Zantac.   You have been scheduled for an endoscopy and colonoscopy with propofol. Please follow the written instructions given to you at your visit today. Please pick up your prep at the pharmacy within the next 1-3 days. If you use inhalers (even only as needed), please bring them with you on the day of your procedure. Your physician has requested that you go to www.startemmi.com and enter the access code given to you at your visit today. This web site gives a general overview about your procedure. However, you should still follow specific instructions given to you by our office regarding your preparation for the procedure.

## 2012-10-07 NOTE — Progress Notes (Signed)
Agree w/ Jaclyn [name]'s management. 

## 2012-10-08 ENCOUNTER — Ambulatory Visit (INDEPENDENT_AMBULATORY_CARE_PROVIDER_SITE_OTHER): Payer: Medicare Other | Admitting: Pulmonary Disease

## 2012-10-08 ENCOUNTER — Encounter: Payer: Self-pay | Admitting: Pulmonary Disease

## 2012-10-08 ENCOUNTER — Other Ambulatory Visit (INDEPENDENT_AMBULATORY_CARE_PROVIDER_SITE_OTHER): Payer: Medicare Other

## 2012-10-08 VITALS — BP 112/70 | HR 77 | Temp 98.3°F | Ht 63.0 in | Wt 156.2 lb

## 2012-10-08 DIAGNOSIS — E559 Vitamin D deficiency, unspecified: Secondary | ICD-10-CM

## 2012-10-08 DIAGNOSIS — K5730 Diverticulosis of large intestine without perforation or abscess without bleeding: Secondary | ICD-10-CM

## 2012-10-08 DIAGNOSIS — E119 Type 2 diabetes mellitus without complications: Secondary | ICD-10-CM

## 2012-10-08 DIAGNOSIS — I10 Essential (primary) hypertension: Secondary | ICD-10-CM

## 2012-10-08 DIAGNOSIS — D126 Benign neoplasm of colon, unspecified: Secondary | ICD-10-CM

## 2012-10-08 DIAGNOSIS — IMO0001 Reserved for inherently not codable concepts without codable children: Secondary | ICD-10-CM

## 2012-10-08 DIAGNOSIS — E039 Hypothyroidism, unspecified: Secondary | ICD-10-CM

## 2012-10-08 DIAGNOSIS — K219 Gastro-esophageal reflux disease without esophagitis: Secondary | ICD-10-CM

## 2012-10-08 DIAGNOSIS — F341 Dysthymic disorder: Secondary | ICD-10-CM

## 2012-10-08 DIAGNOSIS — I679 Cerebrovascular disease, unspecified: Secondary | ICD-10-CM

## 2012-10-08 DIAGNOSIS — M1990 Unspecified osteoarthritis, unspecified site: Secondary | ICD-10-CM

## 2012-10-08 DIAGNOSIS — I4891 Unspecified atrial fibrillation: Secondary | ICD-10-CM

## 2012-10-08 DIAGNOSIS — E785 Hyperlipidemia, unspecified: Secondary | ICD-10-CM

## 2012-10-08 LAB — HEPATIC FUNCTION PANEL
Albumin: 4.2 g/dL (ref 3.5–5.2)
Alkaline Phosphatase: 61 U/L (ref 39–117)
Total Protein: 7.5 g/dL (ref 6.0–8.3)

## 2012-10-08 LAB — URINALYSIS, ROUTINE W REFLEX MICROSCOPIC
Bilirubin Urine: NEGATIVE
Nitrite: POSITIVE
Specific Gravity, Urine: >=1.03 (ref 1.000–1.030)
pH: 6 (ref 5.0–8.0)

## 2012-10-08 LAB — BASIC METABOLIC PANEL
BUN: 13 mg/dL (ref 6–23)
CO2: 23 mEq/L (ref 19–32)
Calcium: 9.5 mg/dL (ref 8.4–10.5)
Creatinine, Ser: 0.8 mg/dL (ref 0.4–1.2)
GFR: 76.49 mL/min (ref >60.00)
Glucose, Bld: 229 mg/dL — ABNORMAL HIGH (ref 70–99)
Sodium: 136 mEq/L (ref 135–145)

## 2012-10-08 LAB — CBC WITH DIFFERENTIAL/PLATELET
Eosinophils Relative: 1.8 % (ref 0.0–5.0)
Monocytes Relative: 8.4 % (ref 3.0–12.0)
Neutrophils Relative %: 66.3 % (ref 43.0–77.0)
Platelets: 232 10*3/uL (ref 150.0–400.0)
WBC: 6.6 10*3/uL (ref 4.5–10.5)

## 2012-10-08 LAB — TSH: TSH: 8.19 u[IU]/mL — ABNORMAL HIGH (ref 0.35–5.50)

## 2012-10-08 LAB — LDL CHOLESTEROL, DIRECT: Direct LDL: 141.5 mg/dL

## 2012-10-08 LAB — HEMOGLOBIN A1C: Hgb A1c MFr Bld: 8.4 % — ABNORMAL HIGH (ref 4.6–6.5)

## 2012-10-08 LAB — LIPID PANEL
Cholesterol: 202 mg/dL — ABNORMAL HIGH (ref 0–200)
HDL: 38.5 mg/dL — ABNORMAL LOW (ref >39.00)
Triglycerides: 231 mg/dL — ABNORMAL HIGH (ref 0.0–149.0)

## 2012-10-08 LAB — VITAMIN B12: Vitamin B-12: 355 pg/mL (ref 211–911)

## 2012-10-08 MED ORDER — INSULIN PEN NEEDLE 29G X 12.7MM MISC: Status: DC

## 2012-10-08 MED ORDER — OXAPROZIN 600 MG PO TABS: ORAL_TABLET | ORAL | Status: DC

## 2012-10-08 MED ORDER — LINAGLIPTIN 5 MG PO TABS: 5.0000 mg | ORAL_TABLET | Freq: Every day | ORAL | Status: DC

## 2012-10-08 NOTE — Patient Instructions (Addendum)
Today we updated your med list in our EPIC system...    Continue your current medications the same...    We refilled the Daypro at your request...  We decided toi switch the Januvia to Tradjenta 5mg  one tab daily...  Today we did your follow up FASTING blood work...    We will contact you w/ the results when available...   Be sure to take your meds daily...    Let's get on a better low chol low fat diet; and remember- NO SWEETS!!!  Call for any questions...  Let's plan a follow up visit in 4-74mo, sooner if needed for problems.Marland KitchenMarland Kitchen

## 2012-10-08 NOTE — Progress Notes (Addendum)
HPI  Review of Systems  Physical Exam  Subjective:    Patient ID: Jaclyn [name], female    DOB: March 14, 1935, 77 y.o.   MRN: [id number]  HPI 77 y/o WF here for a follow up visit... she has multiple medical problems as noted below- followed for HBP, PAF, Carotid disease, Hyperchol, DM, Hypothy, FM, Vit D defic, anxiety/ depression... she is also followed by [name] for Cardiology, and DrWeissman for GI.Marland Kitchen.  ~  June 10, 2010:  27mo ROV & stable, just incr stress w/ 75 y/o daughter's alcoholism and health issues;  She had labs done last week and FLP improved on the Zetia, but BS/ A1c about the same (on Glucovance & Januvia);  BP controlled & she denies CP/ palpit, etc > see prob list below>  ~  October 13, 2011:  22mo ROV & Jaclyn [name] was started on LANTUS 20u/d by TP last month when her BS=364 & A1c=9.7 on her Glucovance/ Januvia meds;  Since then her BS has improved & review of her meter shows BS 120-190 range;  We discussed maximizing the Glucovance 5/500 to 2Bid & continue the [place]; we plan f/u OV & labs in 2 months;  We reviewed low carb diet & exercise program...    She saw [name] last 1 yr ago for f/u of her HBP, Lipids, Carotid dis> hx normal cath in 2008, denies CP/ palpit/ SOB/ cerebral ischemic symptoms; no changes made.    Follow up CDopplers done last 4/13 & showed mod heterogeneous plaque bilat, 60-79% bilat ICAstenoses, patent vertebrals, f/u in 91mo...    She had numerous f/u visits w/ TP this past yr> UTI, problems w/ Zetia, she stopped her Synthroid (?why), nail fungus; all updated below...    She saw DrOttelin for Urology last 6/12> dysuria, hx IC, recurrent UTIs, mild urge incont; treated w/ Trimethoprim 100mg  Qhs which really helps & no infections this yr. We reviewed prob list, meds, xrays and labs> see below for updates >>  ~  January 20, 2012:  34mo ROV & Jaclyn [name] states that her home BS have been improved on the Lantus20... We reviewed the following medical  problems during today's office visit>>     HBP> on Accupril20; BP= 124/78 & she denies HA, CP, palpit, SOB, edema, etc...    AtypCP> reminded to take ASA81mg /d; she denies CP but is too sedentary & reminded to incr exercise program...    PAF> followed by [name] for Cards; holding NSR & denies CP, palpit, dizzy, etc...    Cerebrovasc Dis> should be taking ASA81mg /d; due for f/u CDopplers- she denies cerebral ischemic symptoms...     CHOL> on Zetia10 but only taking it 1-2 per week; she refuses statins or the [facility]; last FLP 4/13 on diet alone showed TChol 319, TG 345, HDL 54, LDL 213; she is not fasting today & says she will recheck her FLP when she sees [place] soon...     DM> on Glucov5/500-2Bid, Januv100, Lantus20; labs show BS=139, A1c=8.5 (improved from 9.7); Rec to incr Lantus to 25u/d...    Hypothy> on Synthroid100; she is clinically euthyroid & her last TSH 7/13 = 2.71...    GI- Divertics, Polyps> she has villous adenoma in cecum removed via right hemicolectomy 6/05; last colonoscopy was 10/09 by DrWeissman- no recurrent polyp, f/u 46yrs.    GU- UTIs> on [id number]; followed by DrOttelin, doing satis w/o recurrent infections...     DJD, FM> on MVI, VitD1000; she uses OTC analgesics as needed...    B12  Defic> on B12500/d; last B12 level was 476 Apr'13...    Anxiety, Depression> on Lexapro20; she feels that she is doing well on this med... We reviewed prob list, meds, xrays and labs> see below for updates >> OK Flu shot today... LABS 12/13:  Chems- ok w/ BS=139, A1c=8.5 >> on Lantus20 & rec to incr to 25u...   ~  October 08, 2012:  8-16mo ROV & Jaclyn [name] is not taking her meds regularly making medication adjustment very difficult & a real challenge for her health care team;  We reviewed the following medical problems during today's office visit >>     HBP> on Accupril20; BP= 112/70 & she denies HA, CP, palpit, SOB, edema, etc...    AtypCP> reminded to take ASA81mg /d; she denies CP but is  too sedentary & reminded to incr exercise program...    PAF> followed by Walker Kehr for Cards; last seen 12/13- holding NSR & denies CP, palpit, dizzy, etc; f/u 88yr...    Cerebrovasc Dis> should be taking ASA81mg /d; CDopplers 12/13 showed bilat irreg heterogeneous plaque- 60-79% RICAstenosis & 40-59% LICAstenosis; she denies cerebral ischemic symptoms...    CHOL> on Zetia10 but only taking it 1-2 per week; she refuses statins or the [facility]; last FLP 8/14 shows TChol 202, TG 231, HDL 39, LDL 142=> asked to take Zetia every day & get on low fat diet!!!    DM> on Lantus25, Glucov5/500-2Bid, Januv100; labs 8/14 show BS=229, A1c=8.4; she wants off Januvia since several extended family members w/ pancreatic ca=> Rec incr Lantus to 30, ch Januv to [place]; take all meds every day!!!    Hypothy> on Synthroid100; she is clinically euthyroid & her last TSH 7/13 = 2.71 on this dose; Labs today 8/14 showed TSH=8.19 & I suspect poor compliance, asked to take it every day 1st thing in the AM...    GI- GERD, Divertics, Polyps> she has villous adenoma in cecum removed via right hemicolectomy 6/05; last colonoscopy was 10/09 by DrWeissman- no recurrent polyp, f/u 102yrs;  She saw [facility] 8/14 c/o GERD, abd pain, ch in stools- rec to take ZANTAC150Bid & sched for EGD/ Colon on 10/29/12 w/ DrGessner...    Elev LFTs> Labs show worsening LFTs w/ labs 8/14 showing SGOT=87, SGPT=68; we will check Sonar & rec low fat diet & wt reduction w/ careful clinical follow up...    GU- UTIs> on [id number]; followed by DrOttelin, 8/14 she is c/o recent incr symptoms (burning, discomfort) & UA shows UTI- treat w/ Cipro...    DJD, FM> on MVI, VitD1000; she uses Daypro600 & OTC analgesics as needed...    B12 Defic> on B12-500/d orally but I wonder if compliant; B12 level 8/14 = 355, therefore continue oral supplement...    Anxiety, Depression> on Lexapro20; she feels that she is doing well on this med... We reviewed prob list, meds, xrays  and labs> see below for updates >>  LABS 8/14:  FLP- improved on Zetia;  Chems- ok x BS=229 A1c=8.4;  LFTs w/ elev SGOT87/ SGPT68;  CBC- wnl;  TSH=8.19;  VitB-12=355;  VitD=38;  UA w/ UTI noted... ADDENDUM >> AbdSonar 8/28 showed diffuse increased echogenicity of the liver and decreased through transmission consistent with fatty infiltration; advised about Steatosis & need for low fat diet, wt reduction, avoid hepatotoxins...          Problem List:  HYPERTENSION (ICD-401.9) - prev on Mavik 2mg /d but switched to ACCUPRIL 20mg /d to save $$ ~  12/11:  BP= 118/74 on Mavik2 & feeling well,  takes med regularly and tolerates well... denies visual changes, CP, palipit, dizziness, syncope, dyspnea, edema, etc...  ~  4/12:  BP= 134/64 & she continues to be essent asymptomatic... ~  8/13:  BP= 124/68 on Quinapril20 now; denies HA, cough, CP, palpit, SOB, edema, etc... ~  12/13: on Accupril20; BP= 124/78 & she denies HA, CP, palpit, SOB, edema, etc...  ~  8/14: on Accupril20; BP= 112/70 & she denies HA, CP, palpit, SOB, edema, etc...  CHEST PAIN, ATYPICAL (ICD-786.59) - she had cath for atyp CP 1/08 showing no signif CAD, EF= 60%... prev CWP part of her FM complex... ~  She has MULT coronary risk factors w/ HBP, DM, Chol, & known carotid vasc dis... ~  CXR 12/11 showed normal heart size, mild peribronch thickening, clear lungs, NAD... ~  Followed by Walker Kehr & seen 12/13- HBP, Hyperlipid, AtypCP, Carotid stenoses; neg cath 2008- no changes made...  PAROXYSMAL ATRIAL FIBRILLATION (ICD-427.31) - followed by Walker Kehr & his notes are reviewed... she is holding NSR without palpit or arrhythmias noted... ~  2DEcho 5/05 was normal- no valve dis, normal wall thickness, norm EF... ~  Holding NSR w/o PAF episodes suspected...  CEREBROVASCULAR DISEASE (ICD-437.9) - on ASA 81mg /d... she has a right carotid bruit and bilat carotid stenoses> denies cerebral ischemic symptoms... ~  CDopplers 11/09 showed mod plaque in  the prox ICAs bilat R>L, 60-79%R & 40-59%L, stable- no change going back to 2005 studies. ~  5/10:  reminded to take her ASA daily. ~  f/u CDopplers 12/11 showed mod plaque, stable bilat carotid dis w/ 60-79% RICA & 40-59% LICA stenoses. ~  f/u CDopplers 4/13 showed mod heterogeneous plaque bilat, 60-79% bilat ICAstenoses, patent vertebrals, f/u in 53mo... ~  12/13: she is due to f/u CDopplers but wants to wait til f/u appt w/ DrNishan soon=> 60-79% RICAstenosis & 40-59%LICAstenosis w/ bilat irreg heterogeneous plaque, f/u 67yr.  HYPERLIPIDEMIA (ICD-272.4) - on Zetia 10mg  +diet... she has refused Statin Rx- "It makes my bones hurt so bad"> went to the [facility] but states she didn't tol Crestor5 "I hurt so bad- it's diff than my FM" & discomfort resolved off Rx... then tried FISH OIL & titrated up to 3/d but her f/u FLP was no better... ~  FLP 12/07 showed TChol 192, TG 172, HDL 38, LDL 120... on diet alone... pt refuses Statin Rx. ~  FLP 9/09 showed TChol 230, TG 203, HDL 38, LDL 176... rec- refer to [facility] ~  FLP 5/10 on FishOil showed TChol 231, TG 145, HDL 41, LDL 169 ~  FLP 12/11 showed TChol 244, TG 278, HDL 42, LDL 176...  try ZETIA 10mg /d. ~  FLP 4/12 on Zetia10 showed TChol 170, TG 175, HDL 41, LDL 94... Continue Zetia & diet... ~  Pt says she developed increased aching, soreness, pain & cut back the Zetia to MWF, then stopped it on her own, also stopped her Synthroid on her own... ~  FLP 4/13 off meds showed TChol 319, TG 345, HDL 54, LDL 213... her TSH off Synthroid was 55 & she refused Chol meds but agreed to restart SYNTHROID 100/d. ~  8/13: pt says she tried the Zetia again but INTOL "it made me feel like I have dementia"; she is not fasting today> she mentioned that a lady at [facility] recommended "ginger pills and flax seed cookies" so she wants to try these next... ~  8/14: not taking Zetia every day & f/u FLP showed TChol 202, TG 231, HDL 39,  LDL 142=> asked to take Zetia every  day & get on low fat diet!!!  DIABETES MELLITUS (ICD-250.00) >>  ~  on GLUCOVANCE 5-500 2AM & 1PM and JANUVIA 100mg /d... ~  labs 2/09 showed BS= 180, HgA1c= 7.8.Marland KitchenMarland Kitchen on Glucov 2.5-500 taking 1 daily. ~  labs 9/09 showed BS= 219, HgA1c= 7.4... she ignored advise to incr to Bid; Januvia100 added. ~  labs 5/10 still on one of each (wt down 7# to 148#) showed BS= 167, A1c= 6.9 ~  labs 12/11 showed BS= 162, A1c= 8.6.Marland KitchenMarland Kitchen rec to incr Glucov5/500 Bid + Januv100/d. ~  Labs 4/12 showed BS=160, A1c=8.4.Marland KitchenMarland Kitchen rec incr to Glucov5/500- 2AM, 1PM; +Januv100/d. ~  Labs 4/13 showed BS= 281, A1c=9.4... But she had stopped mult meds including Synthroid=> told to restart meds. ~  Labs 7/13 showed BS= 364, A1c= 9.7.Marland KitchenMarland Kitchen Pt started on LANTUS 20u daily, and subseq Glucovance incr to 2Bid. ~  Labs 12/13 on Glucov5/500-2Bid, Januv100, Lantus20 showed BS=139 A1c=8.5; rec to incr Lantus25 & take meds every day... ~  8/14: still not taking meds every day> on Lantus25, Glucov5/500-2Bid, Januv100; BS=229 A1c=8.4 & rec to incr Lantus30, she refused Januv therefore ch to Tradjenta5, take meds every day!!  HYPOTHYROIDISM (ICD-244.9) - on SYNTHROID 146mcg/d...  ~  labs 11/08 showed TSH= 10.98... ?if taking med regularly? ~  labs 9/09 showed TSH= 0.63... rec- continue same dose. ~  labs 5/10 showed TSH= 0.49 ~  labs 12/11 showed TSH= 3.21 ~  Labs 4/13 showed TSH= 55... Pt had stopped Synthroid on her own!! ~  Labs 7/13 on Synth100 showed TSH= 2.71 ~  Labs 8/14 on Synthroid100 showed TSH= 8.19... Reminded to take it everyday 1st thing in the am.  DIVERTICULOSIS OF COLON (ICD-562.10) & COLONIC POLYPS (ICD-211.3) - followed by DrWeissman... last colonoscopy 3/05 w divertics and cecal villous adenoma- referred to DrStreck w/ right colectomy 6/05 (no cancer in specimen)... ~  Follow up colonoscopy 10/09 by DrWeissman> patent end-to-end ileo-colonic anastomosis, divertics in desc & sigmoid regions, +hemorrhoids; f/u 5 yrs...  SEBACEOUS  CYST, BREAST (ICD-610.8) - infected seb cyst left chest wall treated in 2009 w/ Keflex & resolved.  DEGENERATIVE JOINT DISEASE (ICD-715.90) - Eval and Rx by DrRamos in 2007... calcif tendonitis in shoulders, DDD in CSpine... given Naprosyn & phys therapy... currently taking DAYPRO 600mg /d Prn...  FIBROMYALGIA (ICD-729.1) - on Daypro as noted... not interested in Lyrica/ [place]/ Rheum consult... ~  She has chronic pain & trigger points assoc w/ the FM...  VITAMIN D DEFICIENCY (ICD-268.9) - she has had a very low Vit D level = 9 on 7/08 & 9/09... she was perscribed Vit D 50,000 u weekly but apparently never took this medication... we wrote it for her again w/ instructions to take it weekly until we tell her to stop, but she only took it for 4 weeks then stopped on her own in favor of 1000 u daily OTC supplement... ~  BMD 7/08 was WNL-  TScore in Spine= +0.6,  TScore in Hips= -0.8.Marland Kitchen. ~   f/u Vit D level 5/10= 29, therefore continue the Vit D 1000/d. ~  F/u Vit D level 4/12 = 29 & she admits not taking the OTC supplement... rec incr to 2000u daily. ~  Labs 4/13 showed Vit D level = 18 & she is reminded to take the supplement every day...  ~  Labs 8/14 showed Vit D level = 38... Continue the 2000u daily supplement...  BORDERLINE B-12 level > Vit B12 level 5/10 = 267 (Hg=  12.6, MCV= 89)... rec to start Vit B12 supplement 100-500 micrograms/d w/ f/u level on return... ~  Labs 4/13 showed B12 level = 476... Continue same. ~  Labs 8/14 showed B12 level = 355... rec to continue B12 supplement ~546mcg daily...  ANXIETY DEPRESSION (ICD-300.4) - on LEXAPRO 20mg /d & notes that this really helps... INSOMNIA (ICD-780.52)   Past Surgical History  Procedure Laterality Date  . Vesicovaginal fistula closure w/ tah  1990    w/ cystocele &retocele repairs Dr. Elana Alm  . Right colectomy  2005    for villous adenoma of the cecum Dr.streck  . Cardiac catheterization  02/19/06    EF 60%    Outpatient Encounter  Prescriptions as of 10/08/2012  Medication Sig Dispense Refill  . B Complex Vitamins (VITAMIN-B COMPLEX PO) Take 1 tablet by mouth daily.        . cholecalciferol (VITAMIN D) 1000 UNITS tablet Take 2,000 Units by mouth daily.       Marland Kitchen escitalopram (LEXAPRO) 20 MG tablet TAKE 1 TABLET BY MOUTH ONCE A DAY  30 tablet  0  . glucose blood test strip 1 each by Other route as needed. Use as instructed check blood sugars once daily       . glyBURIDE-metformin (GLUCOVANCE) 5-500 MG per tablet TAKE 2 TABLETS BY MOUTH 2 (TWO) TIMES DAILY WITH A MEAL.  120 tablet  0  . Insulin Glargine (LANTUS SOLOSTAR) 100 UNIT/ML SOPN       . JANUVIA 100 MG tablet TAKE 1 TABLET BY MOUTH ONCE A DAY  30 tablet  8  . levothyroxine (SYNTHROID, LEVOTHROID) 100 MCG tablet TAKE 1 TABLET BY MOUTH ONCE A DAY  30 tablet  5  . quinapril (ACCUPRIL) 20 MG tablet TAKE 1 TABLET BY MOUTH AT BEDTIME  30 tablet  6  . ranitidine (ZANTAC) 150 MG tablet Take 1 tablet (150 mg total) by mouth 2 (two) times daily.  60 tablet  2  . trimethoprim (TRIMPEX) 100 MG tablet Take 1 tablet (100 mg total) by mouth at bedtime.  30 tablet  0  . vitamin B-12 (CYANOCOBALAMIN) 100 MCG tablet Take 50 mcg by mouth daily.        Marland Kitchen ZETIA 10 MG tablet TAKE 1 TABLET BY MOUTH EVERY DAY  30 tablet  0  . [DISCONTINUED] LANTUS SOLOSTAR 100 UNIT/ML injection INJECT 20 UNITS INTO THE SKIN DAILY.  1 vial  6  . oxaprozin (DAYPRO) 600 MG tablet 1 once daily as needed for joint pain take with food  30 tablet  0  . [DISCONTINUED] azithromycin (ZITHROMAX Z-PAK) 250 MG tablet Take 1 tablet (250 mg total) by mouth as directed.  6 each  0  . [DISCONTINUED] insulin glargine (LANTUS) 100 UNIT/ML injection Inject 25 Units into the skin daily.       No facility-administered encounter medications on file as of 10/08/2012.    Allergies  Allergen Reactions  . Sulfonamide Derivatives     Review of Systems         See HPI - all other systems neg except as noted... The patient  complains of headaches.  The patient denies anorexia, fever, weight loss, weight gain, vision loss, decreased hearing, hoarseness, chest pain, syncope, dyspnea on exertion, peripheral edema, prolonged cough, hemoptysis, abdominal pain, melena, hematochezia, severe indigestion/heartburn, hematuria, incontinence, muscle weakness, suspicious skin lesions, transient blindness, difficulty walking, depression, unusual weight change, abnormal bleeding, enlarged lymph nodes, and angioedema.     Objective:   Physical Exam  WD, WN, 77 y/o WF in NAD... GENERAL:  Alert & oriented; pleasant & cooperative... HEENT:  [place]/AT, EOM-wnl, PERRLA, EACs-clear, TMs-wnl, NOSE-clear, THROAT-clear & wnl. NECK:  Supple w/ fairROM; no JVD; normal carotid impulses w/o bruits; no thyromegaly or nodules palpated; no lymphadenopathy. CHEST:  Clear to P & A; without wheezes/ rales/ or rhonchi. HEART:  Regular Rhythm; without murmurs/ rubs/ or gallops. ABDOMEN:  Soft & nontender; normal bowel sounds; no organomegaly or masses detected, no guarding or rebound, neg CVA tenderness  EXT: without deformities, mild arthritic changes; no varicose veins/ venous insuffic/ or edema. NEURO:  CN's intact; motor testing normal; sensory testing normal; gait normal & balance OK. DERM:  No lesions noted; no rash [email or web address]  RADIOLOGY DATA:  Reviewed in the EPIC EMR & discussed w/ the patient...  LABORATORY DATA:  Reviewed in the EPIC EMR & discussed w/ the patient...   Assessment & Plan:    HBP>  Controlled on Accupril20, continue same + diet & no sodium...  CP/ FM>  She has had atypCP as part of her severe FM problem;  Recently stable w/o pain exac & doing satis on Daypro/ Tylenol/ etc...  Hx AFib>  Holding NSR, remains regular w/o CP, palpit, racing, etc... She knows to avoid caffeine etc...  Cerebrovasc dis>  Stable on ASA without cerebral ischemic symptoms; CDopplers followed at the [facility] lab & showed stable bilat ICA  stenosis, f/u 63yr...  Hyperlipid>  Nice response to Zetia 10mg  but needs to take it every day, get on low fat diet, and lose wt!!!  DM>  She is not taking meds regularly making control impossible; we discussed this & asked to incr Lantus30, take Glucovance-2Bid & ch Januvia (pt refuses this med) to Tradjenta5mg /d...  Increased LFTs>  Routine labs w/ elev SGOT/ SGPT- we will check Abd sonar- rec diet exercise, wt reduction...  Vit D defic>  She really needs to take the supplement, and asked to incr to 2000 u daily...  Anxiety>  She likes the Lexapro & gets $$ help w/ this & wants to continue...  NOTE>  was spent reviewing data from mult providers, discussing Rx & options w/ pt today; all questions answered...   Patient's Medications  New Prescriptions   INSULIN PEN NEEDLE 29G X 12.7MM MISC    Use as directed   LINAGLIPTIN (TRADJENTA) 5 MG TABS TABLET    Take 1 tablet (5 mg total) by mouth daily.  Previous Medications   B COMPLEX VITAMINS (VITAMIN-B COMPLEX PO)    Take 1 tablet by mouth daily.     CHOLECALCIFEROL (VITAMIN D) 1000 UNITS TABLET    Take 2,000 Units by mouth daily.    ESCITALOPRAM (LEXAPRO) 20 MG TABLET    TAKE 1 TABLET BY MOUTH ONCE A DAY   GLUCOSE BLOOD TEST STRIP    1 each by Other route as needed. Use as instructed check blood sugars once daily    GLYBURIDE-METFORMIN (GLUCOVANCE) 5-500 MG PER TABLET    TAKE 2 TABLETS BY MOUTH 2 (TWO) TIMES DAILY WITH A MEAL.   LEVOTHYROXINE (SYNTHROID, LEVOTHROID) 100 MCG TABLET    TAKE 1 TABLET BY MOUTH ONCE A DAY   QUINAPRIL (ACCUPRIL) 20 MG TABLET    TAKE 1 TABLET BY MOUTH AT BEDTIME   RANITIDINE (ZANTAC) 150 MG TABLET    Take 1 tablet (150 mg total) by mouth 2 (two) times daily.   TRIMETHOPRIM (TRIMPEX) 100 MG TABLET    Take 1 tablet (100 mg total) by  mouth at bedtime.   VITAMIN B-12 (CYANOCOBALAMIN) 100 MCG TABLET    Take 50 mcg by mouth daily.     ZETIA 10 MG TABLET    TAKE 1 TABLET BY MOUTH EVERY DAY  Modified Medications    Modified Medication Previous Medication   INSULIN GLARGINE (LANTUS SOLOSTAR) 100 UNIT/ML SOPN LANTUS SOLOSTAR 100 UNIT/ML injection          INJECT 20 UNITS INTO THE SKIN DAILY.   OXAPROZIN (DAYPRO) 600 MG TABLET oxaprozin (DAYPRO) 600 MG tablet      1 once daily as needed for joint pain take with food    1 once daily as needed for joint pain take with food  Discontinued Medications   AZITHROMYCIN (ZITHROMAX Z-PAK) 250 MG TABLET    Take 1 tablet (250 mg total) by mouth as directed.   INSULIN GLARGINE (LANTUS) 100 UNIT/ML INJECTION    Inject 25 Units into the skin daily.   JANUVIA 100 MG TABLET    TAKE 1 TABLET BY MOUTH ONCE A DAY

## 2012-10-09 LAB — VITAMIN D 25 HYDROXY (VIT D DEFICIENCY, FRACTURES): Vit D, 25-Hydroxy: 38 ng/mL (ref 30–89)

## 2012-10-11 ENCOUNTER — Telehealth: Payer: Self-pay | Admitting: Pulmonary Disease

## 2012-10-11 DIAGNOSIS — R7989 Other specified abnormal findings of blood chemistry: Secondary | ICD-10-CM

## 2012-10-11 MED ORDER — EZETIMIBE 10 MG PO TABS: ORAL_TABLET | ORAL | Status: DC

## 2012-10-11 MED ORDER — CIPROFLOXACIN HCL 250 MG PO TABS: 250.0000 mg | ORAL_TABLET | Freq: Two times a day (BID) | ORAL | Status: DC

## 2012-10-11 NOTE — Telephone Encounter (Signed)
Called and spoke with pt and she is aware of lab results and she is aware of cipro and the zetia that has been sent to the pharmacy.  Pt is aware that we will call and set up pt for the [name].  Pt is aware and nothing further is needed.

## 2012-10-12 ENCOUNTER — Other Ambulatory Visit (HOSPITAL_COMMUNITY): Payer: Medicare Other

## 2012-10-14 ENCOUNTER — Ambulatory Visit (HOSPITAL_COMMUNITY)
Admission: RE | Admit: 2012-10-14 | Discharge: 2012-10-14 | Disposition: A | Discharge status: Home / Self Care | Payer: Medicare Other | Source: Ambulatory Visit | Attending: Pulmonary Disease | Admitting: Pulmonary Disease

## 2012-10-14 DIAGNOSIS — K7689 Other specified diseases of liver: Secondary | ICD-10-CM | POA: Insufficient documentation

## 2012-10-14 DIAGNOSIS — R7989 Other specified abnormal findings of blood chemistry: Secondary | ICD-10-CM

## 2012-10-20 ENCOUNTER — Other Ambulatory Visit: Payer: Self-pay | Admitting: Pulmonary Disease

## 2012-10-27 ENCOUNTER — Ambulatory Visit: Payer: Medicare Other | Admitting: Gastroenterology

## 2012-10-29 ENCOUNTER — Ambulatory Visit (AMBULATORY_SURGERY_CENTER): Payer: Medicare Other | Admitting: Internal Medicine

## 2012-10-29 ENCOUNTER — Encounter: Payer: Self-pay | Admitting: Internal Medicine

## 2012-10-29 VITALS — BP 123/59 | HR 66 | Temp 97.5°F | Resp 23 | Ht 63.0 in | Wt 156.0 lb

## 2012-10-29 DIAGNOSIS — D126 Benign neoplasm of colon, unspecified: Secondary | ICD-10-CM

## 2012-10-29 DIAGNOSIS — Z860100 Personal history of colon polyps, unspecified: Secondary | ICD-10-CM

## 2012-10-29 DIAGNOSIS — K2970 Gastritis, unspecified, without bleeding: Secondary | ICD-10-CM

## 2012-10-29 DIAGNOSIS — K219 Gastro-esophageal reflux disease without esophagitis: Secondary | ICD-10-CM

## 2012-10-29 MED ORDER — SODIUM CHLORIDE 0.9 % IV SOLN: 500.0000 mL | INTRAVENOUS | Status: DC

## 2012-10-29 NOTE — Op Note (Signed)
[facility] [address] [place], [address]   ENDOSCOPY PROCEDURE REPORT  PATIENT: Jaclyn [name], Jaclyn [name]  MR#: [id number] BIRTHDATE: February 10, 1936 , 77  yrs. old GENDER: Female ENDOSCOPIST: Iva Boop, MD, Clementeen Graham REFERRED BY:  Alroy Dust, M.D. PROCEDURE DATE:  10/29/2012 PROCEDURE:  EGD, diagnostic ASA CLASS:     Class III INDICATIONS:  Epigastric pain.   Heartburn. MEDICATIONS: propofol (Diprivan) 100mg  IV, MAC sedation, administered by CRNA, and These medications were titrated to patient response per physician's verbal order TOPICAL ANESTHETIC: Cetacaine Spray  DESCRIPTION OF PROCEDURE: After the risks benefits and alternatives of the procedure were thoroughly explained, informed consent was obtained.  The LB [id number] endoscope was introduced through the mouth and advanced to the second portion of the duodenum. Without limitations.  The instrument was slowly withdrawn as the mucosa was fully examined.      STOMACH: Mild gastritis (inflammation) was found in the gastric antrum.  The remainder of the upper endoscopy exam was otherwise normal. Retroflexed views revealed no abnormalities.     The scope was then withdrawn from the patient and the procedure completed.  COMPLICATIONS: There were no complications. ENDOSCOPIC IMPRESSION: 1.   Gastritis (inflammation) was found in the gastric antrum 2.   The remainder of the upper endoscopy exam was otherwise normal  RECOMMENDATIONS: 1.  Continue current meds 2.  Proceed with a Colonoscopy.   eSigned:  Iva Boop, MD, Clementeen Graham 10/29/2012 2:20 PM   FA:OZHYQ Elayne Snare, MD and The Patient

## 2012-10-29 NOTE — Op Note (Signed)
[facility] [address] [place], [address]   COLONOSCOPY PROCEDURE REPORT  PATIENT: Jaclyn [name], Jaclyn [name]  MR#: [id number] BIRTHDATE: 10/28/1935 , 77  yrs. old GENDER: Female ENDOSCOPIST: Iva Boop, MD, [facility] REFERRED XB:JYNWG Kriste Basque, M.D. PROCEDURE DATE:  10/29/2012 PROCEDURE:   Colonoscopy with biopsy First Screening Colonoscopy - Avg.  risk and is 50 yrs.  old or older - No.  Prior Negative Screening - Now for repeat screening. N/A  History of Adenoma - Now for follow-up colonoscopy & has been > or = to 3 yrs.  Yes hx of adenoma.  Has been 3 or more years since last colonoscopy.  Polyps Removed Today? Yes. ASA CLASS:   Class III INDICATIONS:Patient's personal history of adenomatous colon polyps.  MEDICATIONS: There was residual sedation effect present from prior procedure, propofol (Diprivan) 100mg  IV, MAC sedation, administered by CRNA, and These medications were titrated to patient response per physician's verbal order  DESCRIPTION OF PROCEDURE:   After the risks benefits and alternatives of the procedure were thoroughly explained, informed consent was obtained.  A digital rectal exam revealed no abnormalities of the rectum.   The LB PFC-H190 [id number]  endoscope was introduced through the anus and advanced to the terminal ileum which was intubated for a short distance. No adverse events experienced.   The quality of the prep was Suprep good  The instrument was then slowly withdrawn as the colon was fully examined.   COLON FINDINGS: There was evidence of a prior end-to-side ileocolonic surgical anastomosis The finding was in the right colon.   A diminutive sessile polyp was found in the right colon. A polypectomy was performed with cold forceps.  The resection was complete and the polyp tissue was completely retrieved.   The colon mucosa was otherwise normal.  Retroflexed views revealed internal/external hemorrhoids. The time to anastomosis=3 minutes  31 seconds.  Withdrawal time=10 minutes 58 seconds.  The scope was withdrawn and the procedure completed. COMPLICATIONS: There were no complications.  ENDOSCOPIC IMPRESSION: 1.   There was evidence of a prior ileocolonic surgical anastomosis in the right colon 2.   Diminutive sessile polyp was found in the left colon; polypectomy was performed with cold forceps 3.   The colon mucosa was otherwise normal - good prep - prior large TV adenoma of cecum resected 2005  RECOMMENDATIONS: 1.  Timing of repeat colonoscopy will be determined by pathology findings. May not need another routine exam 2.   Call to soon to get appointment for late October. Consider hemorrhoid ligation if persistent problems.  eSigned:  Iva Boop, MD, [facility] 10/29/2012 2:30 PM  cc: The Patient and Michele Mcalpine, MD

## 2012-10-29 NOTE — Patient Instructions (Addendum)
You have mild gastritis in the stomach - irritated lining. Probably not causing symptoms  The colonoscopy showed a tiny polyp that I removed and hemorrhoids.  I will let you know pathology results and when to have another routine colonoscopy by mail. (If you need one)  Please stay on current medications. If the ranitidine (Zantac) is not working try Prilosec or Prevacid over the counter 1 a day.  I would like to see you back in the office in about 4-6 weeks to review things and adjust medications, if needed. I can also treat your hemorrhoids with a rubber band procedure if they continue to bother you. I am giving you a handout about that today. Please call the office next week and make an appointment.   I appreciate the opportunity to care for you. Iva Boop, MD, FACG  YOU HAD AN ENDOSCOPIC PROCEDURE TODAY AT THE [facility]: Refer to the procedure report that was given to you for any specific questions about what was found during the examination.  If the procedure report does not answer your questions, please call your gastroenterologist to clarify.  If you requested that your care partner not be given the details of your procedure findings, then the procedure report has been included in a sealed envelope for you to review at your convenience later.  YOU SHOULD EXPECT: Some feelings of bloating in the abdomen. Passage of more gas than usual.  Walking can help get rid of the air that was put into your GI tract during the procedure and reduce the bloating. If you had a lower endoscopy (such as a colonoscopy or flexible sigmoidoscopy) you may notice spotting of blood in your stool or on the toilet paper. If you underwent a bowel prep for your procedure, then you may not have a normal bowel movement for a few days.  DIET: Your first meal following the procedure should be a light meal and then it is ok to progress to your normal diet.  A half-sandwich or bowl of soup is an example of  a good first meal.  Heavy or fried foods are harder to digest and may make you feel nauseous or bloated.  Likewise meals heavy in dairy and vegetables can cause extra gas to form and this can also increase the bloating.  Drink plenty of fluids but you should avoid alcoholic beverages for 24 hours.  ACTIVITY: Your care partner should take you home directly after the procedure.  You should plan to take it easy, moving slowly for the rest of the day.  You can resume normal activity the day after the procedure however you should NOT DRIVE or use heavy machinery for 24 hours (because of the sedation medicines used during the test).    SYMPTOMS TO REPORT IMMEDIATELY: A gastroenterologist can be reached at any hour.  During normal business hours, 8:30 AM to 5:00 PM Monday through Friday, call [phone].  After hours and on weekends, please call the GI answering service at [phone] who will take a message and have the physician on call contact you.   Following lower endoscopy (colonoscopy or flexible sigmoidoscopy):  Excessive amounts of blood in the stool  Significant tenderness or worsening of abdominal pains  Swelling of the abdomen that is new, acute  Fever of 100F or higher  Following upper endoscopy (EGD)  Vomiting of blood or coffee ground material  New chest pain or pain under the shoulder blades  Painful or persistently difficult swallowing  New shortness of breath  Fever of 100F or higher  Black, tarry-looking stools  FOLLOW UP: If any biopsies were taken you will be contacted by phone or by letter within the next 1-3 weeks.  Call your gastroenterologist if you have not heard about the biopsies in 3 weeks.  Our staff will call the home number listed on your records the next business day following your procedure to check on you and address any questions or concerns that you may have at that time regarding the information given to you following your procedure. This is a courtesy  call and so if there is no answer at the home number and we have not heard from you through the emergency physician on call, we will assume that you have returned to your regular daily activities without incident.  SIGNATURES/CONFIDENTIALITY: You and/or your care partner have signed paperwork which will be entered into your electronic medical record.  These signatures attest to the fact that that the information above on your After Visit Summary has been reviewed and is understood.  Full responsibility of the confidentiality of this discharge information lies with you and/or your care-partner.   Information on polyps,hemorrhoids ,& gastritis given to you today.   Call & make follow up appointment with Dr. Leone Payor in office for 4-6 weeks from today   Per Dr. Leone Payor, You may take Imodium (over the counter ) AS DIRECTED to help prevent urgency of bowel movements

## 2012-10-29 NOTE — Progress Notes (Signed)
Patient did not experience any of the following events: a burn prior to discharge; a fall within the facility; wrong site/side/patient/procedure/implant event; or a hospital transfer or hospital admission upon discharge from the facility. (G8907) Patient did not have preoperative order for IV antibiotic SSI prophylaxis. (G8918)  

## 2012-10-29 NOTE — Progress Notes (Signed)
Report to pacu rn, vss, bbs=clear 

## 2012-10-29 NOTE — Progress Notes (Signed)
Patient has a MP IV airway retroagnathic discussed case with Dr. Leone Payor preop, will proceed with mild-moderate sedation, patient agrees. Post EGD given a jaw thrust sao2 82-87-100% for a minute saturation came right back up with opening airway, ventilating well bbs=clear.

## 2012-10-29 NOTE — Progress Notes (Signed)
Called to room to assist during endoscopic procedure.  Patient ID and intended procedure confirmed with present staff. Received instructions for my participation in the procedure from the performing physician.  

## 2012-11-01 ENCOUNTER — Telehealth: Payer: Self-pay | Admitting: *Deleted

## 2012-11-01 NOTE — Telephone Encounter (Signed)
  Follow up Call-  Call back number 10/29/2012  Post procedure Call Back phone  # [phone]  Permission to leave phone message Yes     Patient questions:  Do you have a fever, pain , or abdominal swelling? no Pain Score  0 *  Have you tolerated food without any problems? yes  Have you been able to return to your normal activities? yes  Do you have any questions about your discharge instructions: Diet   no Medications  no Follow up visit  no  Do you have questions or concerns about your Care? no  Actions: * If pain score is 4 or above: No action needed, pain <4.

## 2012-11-05 ENCOUNTER — Encounter: Payer: Self-pay | Admitting: Internal Medicine

## 2012-11-25 ENCOUNTER — Other Ambulatory Visit: Payer: Self-pay | Admitting: Pulmonary Disease

## 2012-12-10 ENCOUNTER — Telehealth: Payer: Self-pay | Admitting: Pulmonary Disease

## 2012-12-10 DIAGNOSIS — N390 Urinary tract infection, site not specified: Secondary | ICD-10-CM

## 2012-12-10 NOTE — Telephone Encounter (Signed)
Called and spoke with pt and she is aware of TP recs.  She will come into the office on Tuesday to have the ua and culture.  Order has been placed.  Pt stated that urology never does anything for the UTI but tell her that is what it is and she has to pay $35 everytime she goes into see them.  Pt is aware that order has been placed.

## 2012-12-10 NOTE — Telephone Encounter (Signed)
Called and spoke with pt and she stated that she knows she has a UTI.  She stated that she does not have any burning when she voids, but she is having pain in her lower back, hips and legs.  She stated that her urine has a milky look to it.  Pt is requesting that something be called into her pharmacy.  SN is out of the office.  TP please advise. Thanks  Allergies  Allergen Reactions  . Naproxen Sodium Other (See Comments)    Fever/aches and pains  . Sulfonamide Derivatives Hives

## 2012-12-10 NOTE — Telephone Encounter (Signed)
Needs to leave a urine sample for UA and U cx  Has chronic UTI f/by urolgy May should call them if they have recommendations Please contact office for sooner follow up if symptoms do not improve or worsen or seek emergency care

## 2013-01-06 ENCOUNTER — Ambulatory Visit: Payer: Medicare Other | Admitting: Pulmonary Disease

## 2013-01-31 ENCOUNTER — Ambulatory Visit: Payer: Medicare Other | Admitting: Cardiovascular Disease

## 2013-01-31 ENCOUNTER — Encounter (HOSPITAL_COMMUNITY): Payer: Medicare Other

## 2013-02-07 ENCOUNTER — Telehealth: Payer: Self-pay | Admitting: Pulmonary Disease

## 2013-02-07 MED ORDER — CYCLOBENZAPRINE HCL 5 MG PO TABS: ORAL_TABLET | ORAL | Status: DC

## 2013-02-07 NOTE — Telephone Encounter (Signed)
I called and spoke with pt. She c/o HA, facial pressure, sore muscles x 2 weeks. Pt reports she was taking naprosyn for the pain. Denies any nasal congestion, no PND. She reports she has also been jittery as well. Pt is wanting to be seen before christmas. Please advise SN thanks  Allergies  Allergen Reactions  . Naproxen Sodium Other (See Comments)    Fever/aches and pains  . Sulfonamide Derivatives Hives

## 2013-02-07 NOTE — Telephone Encounter (Signed)
Alternate ice and heat to neck  Keep on naprosyn  Can take Flexeril 5mg  1/2 -1 Twice daily  As needed  Muscle tightness #30, no refills  If not better will need ov  Please contact office for sooner follow up if symptoms do not improve or worsen or seek emergency care

## 2013-02-07 NOTE — Telephone Encounter (Signed)
Pt aware of recs. RX has been sent. Nothing further needed 

## 2013-02-15 ENCOUNTER — Encounter: Payer: Self-pay | Admitting: Pulmonary Disease

## 2013-02-21 ENCOUNTER — Emergency Department (HOSPITAL_COMMUNITY): Payer: Medicare Other

## 2013-02-21 ENCOUNTER — Encounter (HOSPITAL_COMMUNITY): Payer: Self-pay | Admitting: Emergency Medicine

## 2013-02-21 ENCOUNTER — Emergency Department (HOSPITAL_COMMUNITY)
Admission: EM | Admit: 2013-02-21 | Discharge: 2013-02-21 | Disposition: A | Discharge status: Home / Self Care | Payer: Medicare Other | Attending: Emergency Medicine | Admitting: Emergency Medicine

## 2013-02-21 DIAGNOSIS — M5412 Radiculopathy, cervical region: Secondary | ICD-10-CM | POA: Insufficient documentation

## 2013-02-21 DIAGNOSIS — E119 Type 2 diabetes mellitus without complications: Secondary | ICD-10-CM | POA: Insufficient documentation

## 2013-02-21 DIAGNOSIS — Z8669 Personal history of other diseases of the nervous system and sense organs: Secondary | ICD-10-CM | POA: Insufficient documentation

## 2013-02-21 DIAGNOSIS — I10 Essential (primary) hypertension: Secondary | ICD-10-CM | POA: Insufficient documentation

## 2013-02-21 DIAGNOSIS — R519 Headache, unspecified: Secondary | ICD-10-CM

## 2013-02-21 DIAGNOSIS — IMO0001 Reserved for inherently not codable concepts without codable children: Secondary | ICD-10-CM | POA: Insufficient documentation

## 2013-02-21 DIAGNOSIS — Z8719 Personal history of other diseases of the digestive system: Secondary | ICD-10-CM | POA: Insufficient documentation

## 2013-02-21 DIAGNOSIS — I4891 Unspecified atrial fibrillation: Secondary | ICD-10-CM | POA: Insufficient documentation

## 2013-02-21 DIAGNOSIS — R0989 Other specified symptoms and signs involving the circulatory and respiratory systems: Secondary | ICD-10-CM | POA: Insufficient documentation

## 2013-02-21 DIAGNOSIS — M1990 Unspecified osteoarthritis, unspecified site: Secondary | ICD-10-CM | POA: Insufficient documentation

## 2013-02-21 DIAGNOSIS — G4700 Insomnia, unspecified: Secondary | ICD-10-CM | POA: Insufficient documentation

## 2013-02-21 DIAGNOSIS — Z794 Long term (current) use of insulin: Secondary | ICD-10-CM | POA: Insufficient documentation

## 2013-02-21 DIAGNOSIS — E559 Vitamin D deficiency, unspecified: Secondary | ICD-10-CM | POA: Insufficient documentation

## 2013-02-21 DIAGNOSIS — Z882 Allergy status to sulfonamides status: Secondary | ICD-10-CM | POA: Insufficient documentation

## 2013-02-21 DIAGNOSIS — I679 Cerebrovascular disease, unspecified: Secondary | ICD-10-CM | POA: Insufficient documentation

## 2013-02-21 DIAGNOSIS — F341 Dysthymic disorder: Secondary | ICD-10-CM | POA: Insufficient documentation

## 2013-02-21 DIAGNOSIS — M25519 Pain in unspecified shoulder: Secondary | ICD-10-CM | POA: Insufficient documentation

## 2013-02-21 DIAGNOSIS — R51 Headache: Secondary | ICD-10-CM

## 2013-02-21 DIAGNOSIS — Z789 Other specified health status: Secondary | ICD-10-CM | POA: Insufficient documentation

## 2013-02-21 DIAGNOSIS — E785 Hyperlipidemia, unspecified: Secondary | ICD-10-CM | POA: Insufficient documentation

## 2013-02-21 DIAGNOSIS — Z79899 Other long term (current) drug therapy: Secondary | ICD-10-CM | POA: Insufficient documentation

## 2013-02-21 DIAGNOSIS — Z87440 Personal history of urinary (tract) infections: Secondary | ICD-10-CM | POA: Insufficient documentation

## 2013-02-21 DIAGNOSIS — Z888 Allergy status to other drugs, medicaments and biological substances status: Secondary | ICD-10-CM | POA: Insufficient documentation

## 2013-02-21 DIAGNOSIS — Z8673 Personal history of transient ischemic attack (TIA), and cerebral infarction without residual deficits: Secondary | ICD-10-CM | POA: Insufficient documentation

## 2013-02-21 DIAGNOSIS — M546 Pain in thoracic spine: Secondary | ICD-10-CM | POA: Insufficient documentation

## 2013-02-21 DIAGNOSIS — E039 Hypothyroidism, unspecified: Secondary | ICD-10-CM | POA: Insufficient documentation

## 2013-02-21 LAB — CBC WITH DIFFERENTIAL/PLATELET
Basophils Absolute: 0 10*3/uL (ref 0.0–0.1)
Basophils Relative: 0 % (ref 0–1)
EOS ABS: 0.1 10*3/uL (ref 0.0–0.7)
Eosinophils Relative: 2 % (ref 0–5)
HCT: 36.5 % (ref 36.0–46.0)
HEMOGLOBIN: 12.3 g/dL (ref 12.0–15.0)
LYMPHS ABS: 1.2 10*3/uL (ref 0.7–4.0)
Lymphocytes Relative: 17 % (ref 12–46)
MCH: 30.1 pg (ref 26.0–34.0)
MCHC: 33.7 g/dL (ref 30.0–36.0)
MCV: 89.5 fL (ref 78.0–100.0)
MONO ABS: 0.6 10*3/uL (ref 0.1–1.0)
MONOS PCT: 8 % (ref 3–12)
NEUTROS PCT: 73 % (ref 43–77)
Neutro Abs: 5.2 10*3/uL (ref 1.7–7.7)
Platelets: 212 10*3/uL (ref 150–400)
RBC: 4.08 MIL/uL (ref 3.87–5.11)
RDW: 14.3 % (ref 11.5–15.5)
WBC: 7.1 10*3/uL (ref 4.0–10.5)

## 2013-02-21 LAB — TROPONIN I: Troponin I: <0.3 ng/mL (ref <0.30)

## 2013-02-21 LAB — BASIC METABOLIC PANEL
BUN: 9 mg/dL (ref 6–23)
CO2: 26 mEq/L (ref 19–32)
Calcium: 9.4 mg/dL (ref 8.4–10.5)
Chloride: 100 mEq/L (ref 96–112)
Creatinine, Ser: 0.61 mg/dL (ref 0.50–1.10)
GFR calc Af Amer: >90 mL/min (ref >90)
GFR calc non Af Amer: 87 mL/min — ABNORMAL LOW (ref >90)
GLUCOSE: 146 mg/dL — AB (ref 70–99)
POTASSIUM: 3.9 meq/L (ref 3.7–5.3)
Sodium: 142 mEq/L (ref 137–147)

## 2013-02-21 MED ORDER — METHOCARBAMOL 500 MG PO TABS: 500.0000 mg | ORAL_TABLET | Freq: Three times a day (TID) | ORAL | Status: DC | PRN

## 2013-02-21 NOTE — ED Provider Notes (Signed)
CSN: [id number]     Arrival date & time 02/21/13  2010 History   First MD Initiated Contact with Patient 02/21/13 2013     Chief Complaint  Patient presents with  . Headache  . Hypertension   (Consider location/radiation/quality/duration/timing/severity/associated sxs/prior Treatment) HPI Comments: Patient presents to ER for evaluation of headache. Patient reports that she has been experiencing a headache for approximately a month. She is this pain in the neck area which radiates down into the upper back and shoulders. Pain is moderate time severe. She was given a muscle relaxer for this by her doctor but it did not help. It radiates up the back of the head as well. He reports that the pain has been primarily on the right side, but today was on the left. This was for precipitated her calling an ambulance. She has not had any visual disturbance. There is no chest pain, shortness of breath. The patient was noted to be hypertensive for EMS. She reports a history of well-controlled hypertension.  Patient is a 78 y.o. female presenting with headaches and hypertension.  Headache Associated symptoms: back pain and neck pain   Hypertension Associated symptoms include headaches.    Past Medical History  Diagnosis Date  . Unspecified essential hypertension   . Other chest pain   . Atrial fibrillation   . Cerebrovascular disease, unspecified   . Other and unspecified hyperlipidemia   . Type II or unspecified type diabetes mellitus without mention of complication, not stated as uncontrolled   . Unspecified hypothyroidism   . Diverticulosis of colon (without mention of hemorrhage)   . Benign neoplasm of colon   . Other specified benign mammary dysplasias   . Urinary tract infection, site not specified   . Osteoarthrosis, unspecified whether generalized or localized, unspecified site   . Myalgia and myositis, unspecified   . Unspecified vitamin D deficiency   . Headache(784.0)   . Dysthymic  disorder   . Insomnia, unspecified   . Carotid bruit   . Carotid artery occlusion     60-79% right ICA stenosis  . Fibromyalgia   . Interstitial cystitis   . Difficult intubation 2008    During surgery to remove large polyp   Past Surgical History  Procedure Laterality Date  . Vesicovaginal fistula closure w/ tah  1990    w/ cystocele &retocele repairs Dr. Ree Edman  . Right colectomy  2005    for villous adenoma of the cecum Dr.streck  . Cardiac catheterization  02/19/06    EF 60%  . Colonoscopy     Family History  Problem Relation Age of Onset  . Lymphoma Father   . Hypertension Father   . Stroke Father   . Hyperlipidemia Father   . Hypertension Mother   . Allergies Brother   . Hypertension Sister     3  . Breast cancer Sister   . Fibromyalgia Sister     3  . Hyperlipidemia Sister     3   History  Substance Use Topics  . Smoking status: Never Smoker   . Smokeless tobacco: Never Used  . Alcohol Use: No   OB History   Grav Para Term Preterm Abortions TAB SAB Ect Mult Living                 Review of Systems  Respiratory: Negative.   Cardiovascular: Negative.   Musculoskeletal: Positive for back pain and neck pain.  Neurological: Positive for headaches.  All other systems reviewed and are  negative.    Allergies  Naproxen sodium and Sulfonamide derivatives  Home Medications   Current Outpatient Rx  Name  Route  Sig  Dispense  Refill  . B Complex Vitamins (VITAMIN-B COMPLEX PO)   Oral   Take 1 tablet by mouth daily.           . cholecalciferol (VITAMIN D) 1000 UNITS tablet   Oral   Take 2,000 Units by mouth daily.          . ciprofloxacin (CIPRO) 250 MG tablet   Oral   Take 1 tablet (250 mg total) by mouth 2 (two) times daily.   14 tablet   0   . cyclobenzaprine (FLEXERIL) 5 MG tablet      1/2-1 tablet twice daily as needed   30 tablet   0   . escitalopram (LEXAPRO) 20 MG tablet      TAKE 1 TABLET BY MOUTH ONCE A DAY   30 tablet    6   . ezetimibe (ZETIA) 10 MG tablet      TAKE 1 TABLET BY MOUTH EVERY DAY   30 tablet   11   . glucose blood test strip   Other   1 each by Other route as needed. Use as instructed check blood sugars once daily          . glyBURIDE-metformin (GLUCOVANCE) 5-500 MG per tablet      TAKE 2 TABLETS BY MOUTH 2 (TWO) TIMES DAILY WITH A MEAL.   120 tablet   6   . Insulin Glargine (LANTUS SOLOSTAR) 100 UNIT/ML SOPN               . Insulin Pen Needle 29G X 12.7MM MISC      Use as directed   100 each   5   . levothyroxine (SYNTHROID, LEVOTHROID) 100 MCG tablet      TAKE 1 TABLET BY MOUTH ONCE A DAY   30 tablet   5   . linagliptin (TRADJENTA) 5 MG TABS tablet   Oral   Take 1 tablet (5 mg total) by mouth daily.   30 tablet   6   . oxaprozin (DAYPRO) 600 MG tablet      1 once daily as needed for joint pain take with food   30 tablet   6   . quinapril (ACCUPRIL) 20 MG tablet      TAKE 1 TABLET BY MOUTH AT BEDTIME   30 tablet   6   . ranitidine (ZANTAC) 150 MG tablet   Oral   Take 1 tablet (150 mg total) by mouth 2 (two) times daily.   60 tablet   2   . trimethoprim (TRIMPEX) 100 MG tablet   Oral   Take 1 tablet (100 mg total) by mouth at bedtime.   30 tablet   0   . vitamin B-12 (CYANOCOBALAMIN) 100 MCG tablet   Oral   Take 50 mcg by mouth daily.            BP 164/64  Pulse 75  Temp(Src) 98.2 F (36.8 C) (Oral)  Resp 18  SpO2 97% Physical Exam  Constitutional: She is oriented to person, place, and time. She appears well-developed and well-nourished. No distress.  HENT:  Head: Normocephalic and atraumatic.  Right Ear: Hearing normal.  Left Ear: Hearing normal.  Nose: Nose normal.  Mouth/Throat: Oropharynx is clear and moist and mucous membranes are normal.  Eyes: Conjunctivae and EOM are normal. Pupils are  equal, round, and reactive to light.  Neck: Normal range of motion. Neck supple. Muscular tenderness present.  Cardiovascular: Regular  rhythm, S1 normal and S2 normal.  Exam reveals no gallop and no friction rub.   No murmur heard. Pulmonary/Chest: Effort normal and breath sounds normal. No respiratory distress. She exhibits no tenderness.  Abdominal: Soft. Normal appearance and bowel sounds are normal. There is no hepatosplenomegaly. There is no tenderness. There is no rebound, no guarding, no tenderness at McBurney's point and negative Murphy's sign. No hernia.  Musculoskeletal: Normal range of motion.  Neurological: She is alert and oriented to person, place, and time. She has normal strength. No cranial nerve deficit or sensory deficit. Coordination normal. GCS eye subscore is 4. GCS verbal subscore is 5. GCS motor subscore is 6.  Skin: Skin is warm, dry and intact. No rash noted. No cyanosis.  Psychiatric: She has a normal mood and affect. Her speech is normal and behavior is normal. Thought content normal.    ED Course  Procedures (including critical care time) Labs Review Labs Reviewed  BASIC METABOLIC PANEL - Abnormal; Notable for the following:    Glucose, Bld 146 (*)    GFR calc non Af Amer 87 (*)    All other components within normal limits  CBC WITH DIFFERENTIAL  TROPONIN I   Imaging Review Ct Head Wo Contrast  02/21/2013   CLINICAL DATA:  Headache for 1 month.  Hypertension.  EXAM: CT HEAD WITHOUT CONTRAST  TECHNIQUE: Contiguous axial images were obtained from the base of the skull through the vertex without intravenous contrast.  COMPARISON:  None.  FINDINGS: There is extensive hypoattenuation in the subcortical and periventricular deep white matter most consistent with chronic microvascular ischemic change. Cortical atrophy is also noted. No evidence of acute abnormality including infarct, hemorrhage, mass lesion, mass effect, midline shift or abnormal extra-axial fluid collection is identified. There is no hydrocephalus or pneumocephalus. The calvarium is intact.  IMPRESSION: No acute finding. Atrophy and  chronic microvascular ischemic change.   Electronically Signed   By: Inge Rise M.D.   On: 02/21/2013 21:23    EKG Interpretation    Date/Time:  Monday February 21 2013 20:42:35 EST Ventricular Rate:  70 PR Interval:  147 QRS Duration: 85 QT Interval:  396 QTC Calculation: 427 R Axis:   -90 Text Interpretation:  Age not entered, assumed to be  78 years old for purpose of ECG interpretation Sinus rhythm Left anterior fascicular block Abnormal R-wave progression, late transition Nonspecific T abnormalities, lateral leads No previous tracing Confirmed by POLLINA  MD, CHRISTOPHER ([id number]) on 02/21/2013 10:26:02 PM            MDM  Diagnosis: 1. Cervical McDuffie 2. Headache 3. Elevated blood pressure  Patient presents to the ER for evaluation of headache has been going on for more than a month. The nature of headache change today, so she came to the ER. Patient has normal neurologic function on exam. Pain is in the base of the neck and radiates to the back as well as up into the back of the head. This is suspicious for musculoskeletal neck pain possible cervical radiculopathy. She does not have any neurologic dysfunction in the upper extremities, however. Patient has a history of fibromyalgia, this is likely contributing to her pain.  CT scan of head was unremarkable. Cardiac workup was also evaluated, unremarkable. Patient's blood pressure was very elevated for EMS, moderately elevated arrival and she is now normotensive without any intervention.  This can be monitored by her primary care doctor.    Orpah Greek, MD 02/21/13 [phone]

## 2013-02-21 NOTE — ED Notes (Signed)
Pt complaining of headache x 1 month. Pt according to EMS is hypertensive.  Stroke Screen negative according to EMS CBG- 131 20 g L FA 12 lead unremarkable HX- carotid artery blockages (right 70%) left (unknown)  Vital- BP- 161/106 HR- 81 SpO2 98% RR-18

## 2013-02-21 NOTE — Discharge Instructions (Signed)
Cervical Radiculopathy Cervical radiculopathy happens when a nerve in the neck is pinched or bruised by a slipped (herniated) disk or by arthritic changes in the bones of the cervical spine. This can occur due to an injury or as part of the normal aging process. Pressure on the cervical nerves can cause pain or numbness that runs from your neck all the way down into your arm and fingers. CAUSES  There are many possible causes, including:  Injury.  Muscle tightness in the neck from overuse.  Swollen, painful joints (arthritis).  Breakdown or degeneration in the bones and joints of the spine (spondylosis) due to aging.  Bone spurs that may develop near the cervical nerves. SYMPTOMS  Symptoms include pain, weakness, or numbness in the affected arm and hand. Pain can be severe or irritating. Symptoms may be worse when extending or turning the neck. DIAGNOSIS  Your caregiver will ask about your symptoms and do a physical exam. He or she may test your strength and reflexes. X-rays, CT scans, and MRI scans may be needed in cases of injury or if the symptoms do not go away after a period of time. Electromyography (EMG) or nerve conduction testing may be done to study how your nerves and muscles are working. TREATMENT  Your caregiver may recommend certain exercises to help relieve your symptoms. Cervical radiculopathy can, and often does, get better with time and treatment. If your problems continue, treatment options may include:  Wearing a soft collar for short periods of time.  Physical therapy to strengthen the neck muscles.  Medicines, such as nonsteroidal anti-inflammatory drugs (NSAIDs), oral corticosteroids, or spinal injections.  Surgery. Different types of surgery may be done depending on the cause of your problems. HOME CARE INSTRUCTIONS   Put ice on the affected area.  Put ice in a plastic bag.  Place a towel between your skin and the bag.  Leave the ice on for 15-20 minutes,  03-04 times a day or as directed by your caregiver.  If ice does not help, you can try using heat. Take a warm shower or bath, or use a hot water bottle as directed by your caregiver.  You may try a gentle neck and shoulder massage.  Use a flat pillow when you sleep.  Only take over-the-counter or prescription medicines for pain, discomfort, or fever as directed by your caregiver.  If physical therapy was prescribed, follow your caregiver's directions.  If a soft collar was prescribed, use it as directed. SEEK IMMEDIATE MEDICAL CARE IF:   Your pain gets much worse and cannot be controlled with medicines.  You have weakness or numbness in your hand, arm, face, or leg.  You have a high fever or a stiff, rigid neck.  You lose bowel or bladder control (incontinence).  You have trouble with walking, balance, or speaking. MAKE SURE YOU:   Understand these instructions.  Will watch your condition.  Will get help right away if you are not doing well or get worse. Document Released: 10/29/2000 Document Revised: 04/28/2011 Document Reviewed: 09/17/2010 [facility] Patient Information 2014 [place], [place].  Hypertension As your heart beats, it forces blood through your arteries. This force is your blood pressure. If the pressure is too high, it is called hypertension (HTN) or high blood pressure. HTN is dangerous because you may have it and not know it. High blood pressure may mean that your heart has to work harder to pump blood. Your arteries may be narrow or stiff. The extra work puts  you at risk for heart disease, stroke, and other problems.  Blood pressure consists of two numbers, a higher number over a lower, 110/72, for example. It is stated as "110 over 72." The ideal is below 120 for the top number (systolic) and under 80 for the bottom (diastolic). Write down your blood pressure today. You should pay close attention to your blood pressure if you have certain conditions such  as:  Heart failure.  Prior heart attack.  Diabetes  Chronic kidney disease.  Prior stroke.  Multiple risk factors for heart disease. To see if you have HTN, your blood pressure should be measured while you are seated with your arm held at the level of the heart. It should be measured at least twice. A one-time elevated blood pressure reading (especially in the Emergency Department) does not mean that you need treatment. There may be conditions in which the blood pressure is different between your right and left arms. It is important to see your caregiver soon for a recheck. Most people have essential hypertension which means that there is not a specific cause. This type of high blood pressure may be lowered by changing lifestyle factors such as:  Stress.  Smoking.  Lack of exercise.  Excessive weight.  Drug/tobacco/alcohol use.  Eating less salt. Most people do not have symptoms from high blood pressure until it has caused damage to the body. Effective treatment can often prevent, delay or reduce that damage. TREATMENT  When a cause has been identified, treatment for high blood pressure is directed at the cause. There are a large number of medications to treat HTN. These fall into several categories, and your caregiver will help you select the medicines that are best for you. Medications may have side effects. You should review side effects with your caregiver. If your blood pressure stays high after you have made lifestyle changes or started on medicines,   Your medication(s) may need to be changed.  Other problems may need to be addressed.  Be certain you understand your prescriptions, and know how and when to take your medicine.  Be sure to follow up with your caregiver within the time frame advised (usually within two weeks) to have your blood pressure rechecked and to review your medications.  If you are taking more than one medicine to lower your blood pressure, make  sure you know how and at what times they should be taken. Taking two medicines at the same time can result in blood pressure that is too low. SEEK IMMEDIATE MEDICAL CARE IF:  You develop a severe headache, blurred or changing vision, or confusion.  You have unusual weakness or numbness, or a faint feeling.  You have severe chest or abdominal pain, vomiting, or breathing problems. MAKE SURE YOU:   Understand these instructions.  Will watch your condition.  Will get help right away if you are not doing well or get worse. Document Released: 02/03/2005 Document Revised: 04/28/2011 Document Reviewed: 09/24/2007 [facility] Patient Information 2014 [place]. Headaches, Frequently Asked Questions MIGRAINE HEADACHES Q: What is migraine? What causes it? How can I treat it? A: Generally, migraine headaches begin as a dull ache. Then they develop into a constant, throbbing, and pulsating pain. You may experience pain at the temples. You may experience pain at the front or back of one or both sides of the head. The pain is usually accompanied by a combination of:  Nausea.  Vomiting.  Sensitivity to light and noise. Some people (about 15%) experience  an aura (see below) before an attack. The cause of migraine is believed to be chemical reactions in the brain. Treatment for migraine may include over-the-counter or prescription medications. It may also include self-help techniques. These include relaxation training and biofeedback.  Q: What is an aura? A: About 15% of people with migraine get an "aura". This is a sign of neurological symptoms that occur before a migraine headache. You may see wavy or jagged lines, dots, or flashing lights. You might experience tunnel vision or blind spots in one or both eyes. The aura can include visual or auditory hallucinations (something imagined). It may include disruptions in smell (such as strange odors), taste or touch. Other symptoms  include:  Numbness.  A "pins and needles" sensation.  Difficulty in recalling or speaking the correct word. These neurological events may last as long as 60 minutes. These symptoms will fade as the headache begins. Q: What is a trigger? A: Certain physical or environmental factors can lead to or "trigger" a migraine. These include:  Foods.  Hormonal changes.  Weather.  Stress. It is important to remember that triggers are different for everyone. To help prevent migraine attacks, you need to figure out which triggers affect you. Keep a headache diary. This is a good way to track triggers. The diary will help you talk to your healthcare professional about your condition. Q: Does weather affect migraines? A: Bright sunshine, hot, humid conditions, and drastic changes in barometric pressure may lead to, or "trigger," a migraine attack in some people. But studies have shown that weather does not act as a trigger for everyone with migraines. Q: What is the link between migraine and hormones? A: Hormones start and regulate many of your body's functions. Hormones keep your body in balance within a constantly changing environment. The levels of hormones in your body are unbalanced at times. Examples are during menstruation, pregnancy, or menopause. That can lead to a migraine attack. In fact, about three quarters of all women with migraine report that their attacks are related to the menstrual cycle.  Q: Is there an increased risk of stroke for migraine sufferers? A: The likelihood of a migraine attack causing a stroke is very remote. That is not to say that migraine sufferers cannot have a stroke associated with their migraines. In persons under age 103, the most common associated factor for stroke is migraine headache. But over the course of a person's normal life span, the occurrence of migraine headache may actually be associated with a reduced risk of dying from cerebrovascular disease due to  stroke.  Q: What are acute medications for migraine? A: Acute medications are used to treat the pain of the headache after it has started. Examples over-the-counter medications, NSAIDs, ergots, and triptans.  Q: What are the triptans? A: Triptans are the newest class of abortive medications. They are specifically targeted to treat migraine. Triptans are vasoconstrictors. They moderate some chemical reactions in the brain. The triptans work on receptors in your brain. Triptans help to restore the balance of a neurotransmitter called serotonin. Fluctuations in levels of serotonin are thought to be a main cause of migraine.  Q: Are over-the-counter medications for migraine effective? A: Over-the-counter, or "OTC," medications may be effective in relieving mild to moderate pain and associated symptoms of migraine. But you should see your caregiver before beginning any treatment regimen for migraine.  Q: What are preventive medications for migraine? A: Preventive medications for migraine are sometimes referred to as "prophylactic" treatments. They  are used to reduce the frequency, severity, and length of migraine attacks. Examples of preventive medications include antiepileptic medications, antidepressants, beta-blockers, calcium channel blockers, and NSAIDs (nonsteroidal anti-inflammatory drugs). Q: Why are anticonvulsants used to treat migraine? A: During the past few years, there has been an increased interest in antiepileptic drugs for the prevention of migraine. They are sometimes referred to as "anticonvulsants". Both epilepsy and migraine may be caused by similar reactions in the brain.  Q: Why are antidepressants used to treat migraine? A: Antidepressants are typically used to treat people with depression. They may reduce migraine frequency by regulating chemical levels, such as serotonin, in the brain.  Q: What alternative therapies are used to treat migraine? A: The term "alternative therapies" is  often used to describe treatments considered outside the scope of conventional Western medicine. Examples of alternative therapy include acupuncture, acupressure, and yoga. Another common alternative treatment is herbal therapy. Some herbs are believed to relieve headache pain. Always discuss alternative therapies with your caregiver before proceeding. Some herbal products contain arsenic and other toxins. TENSION HEADACHES Q: What is a tension-type headache? What causes it? How can I treat it? A: Tension-type headaches occur randomly. They are often the result of temporary stress, anxiety, fatigue, or anger. Symptoms include soreness in your temples, a tightening band-like sensation around your head (a "vice-like" ache). Symptoms can also include a pulling feeling, pressure sensations, and contracting head and neck muscles. The headache begins in your forehead, temples, or the back of your head and neck. Treatment for tension-type headache may include over-the-counter or prescription medications. Treatment may also include self-help techniques such as relaxation training and biofeedback. CLUSTER HEADACHES Q: What is a cluster headache? What causes it? How can I treat it? A: Cluster headache gets its name because the attacks come in groups. The pain arrives with little, if any, warning. It is usually on one side of the head. A tearing or bloodshot eye and a runny nose on the same side of the headache may also accompany the pain. Cluster headaches are believed to be caused by chemical reactions in the brain. They have been described as the most severe and intense of any headache type. Treatment for cluster headache includes prescription medication and oxygen. SINUS HEADACHES Q: What is a sinus headache? What causes it? How can I treat it? A: When a cavity in the bones of the face and skull (a sinus) becomes inflamed, the inflammation will cause localized pain. This condition is usually the result of an  allergic reaction, a tumor, or an infection. If your headache is caused by a sinus blockage, such as an infection, you will probably have a fever. An x-ray will confirm a sinus blockage. Your caregiver's treatment might include antibiotics for the infection, as well as antihistamines or decongestants.  REBOUND HEADACHES Q: What is a rebound headache? What causes it? How can I treat it? A: A pattern of taking acute headache medications too often can lead to a condition known as "rebound headache." A pattern of taking too much headache medication includes taking it more than 2 days per week or in excessive amounts. That means more than the label or a caregiver advises. With rebound headaches, your medications not only stop relieving pain, they actually begin to cause headaches. Doctors treat rebound headache by tapering the medication that is being overused. Sometimes your caregiver will gradually substitute a different type of treatment or medication. Stopping may be a challenge. Regularly overusing a medication increases the potential  for serious side effects. Consult a caregiver if you regularly use headache medications more than 2 days per week or more than the label advises. ADDITIONAL QUESTIONS AND ANSWERS Q: What is biofeedback? A: Biofeedback is a self-help treatment. Biofeedback uses special equipment to monitor your body's involuntary physical responses. Biofeedback monitors:  Breathing.  Pulse.  Heart rate.  Temperature.  Muscle tension.  Brain activity. Biofeedback helps you refine and perfect your relaxation exercises. You learn to control the physical responses that are related to stress. Once the technique has been mastered, you do not need the equipment any more. Q: Are headaches hereditary? A: Four out of five (80%) of people that suffer report a family history of migraine. Scientists are not sure if this is genetic or a family predisposition. Despite the uncertainty, a child has  a 50% chance of having migraine if one parent suffers. The child has a 75% chance if both parents suffer.  Q: Can children get headaches? A: By the time they reach high school, most young people have experienced some type of headache. Many safe and effective approaches or medications can prevent a headache from occurring or stop it after it has begun.  Q: What type of doctor should I see to diagnose and treat my headache? A: Start with your primary caregiver. Discuss his or her experience and approach to headaches. Discuss methods of classification, diagnosis, and treatment. Your caregiver may decide to recommend you to a headache specialist, depending upon your symptoms or other physical conditions. Having diabetes, allergies, etc., may require a more comprehensive and inclusive approach to your headache. The [employer] will provide, upon request, a list of [facility] physician members in your state. Document Released: 04/26/2003 Document Revised: 04/28/2011 Document Reviewed: 10/04/2007 [facility] Patient Information 2014 [place].

## 2013-02-25 ENCOUNTER — Encounter (HOSPITAL_COMMUNITY): Payer: Medicare Other

## 2013-02-25 ENCOUNTER — Ambulatory Visit: Payer: Medicare Other | Admitting: Cardiovascular Disease

## 2013-03-02 ENCOUNTER — Ambulatory Visit: Payer: Medicare Other | Admitting: Pulmonary Disease

## 2013-03-07 ENCOUNTER — Telehealth: Payer: Self-pay | Admitting: Pulmonary Disease

## 2013-03-07 ENCOUNTER — Ambulatory Visit: Payer: Medicare Other | Admitting: Adult Health

## 2013-03-07 NOTE — Telephone Encounter (Signed)
Spoke with patient-states she continues to have HBP and went to ER 02/21/2013 for this. Pt was added to TP schedule tomorrow and will bring her cuff with her as well.

## 2013-03-08 ENCOUNTER — Other Ambulatory Visit (INDEPENDENT_AMBULATORY_CARE_PROVIDER_SITE_OTHER): Payer: Medicare Other

## 2013-03-08 ENCOUNTER — Encounter: Payer: Self-pay | Admitting: Adult Health

## 2013-03-08 ENCOUNTER — Ambulatory Visit (INDEPENDENT_AMBULATORY_CARE_PROVIDER_SITE_OTHER): Payer: Medicare Other | Admitting: Adult Health

## 2013-03-08 VITALS — BP 140/80 | HR 87 | Temp 98.5°F | Ht 62.5 in | Wt 152.0 lb

## 2013-03-08 DIAGNOSIS — E1310 Other specified diabetes mellitus with ketoacidosis without coma: Secondary | ICD-10-CM

## 2013-03-08 DIAGNOSIS — E1110 Type 2 diabetes mellitus with ketoacidosis without coma: Secondary | ICD-10-CM

## 2013-03-08 DIAGNOSIS — I10 Essential (primary) hypertension: Secondary | ICD-10-CM

## 2013-03-08 DIAGNOSIS — E119 Type 2 diabetes mellitus without complications: Secondary | ICD-10-CM

## 2013-03-08 DIAGNOSIS — R51 Headache: Secondary | ICD-10-CM

## 2013-03-08 DIAGNOSIS — E039 Hypothyroidism, unspecified: Secondary | ICD-10-CM

## 2013-03-08 LAB — BASIC METABOLIC PANEL
BUN: 13 mg/dL (ref 6–23)
CO2: 28 meq/L (ref 19–32)
CREATININE: 0.8 mg/dL (ref 0.4–1.2)
Calcium: 9.6 mg/dL (ref 8.4–10.5)
Chloride: 102 mEq/L (ref 96–112)
GFR: 72.12 mL/min (ref >60.00)
Glucose, Bld: 203 mg/dL — ABNORMAL HIGH (ref 70–99)
Potassium: 4 mEq/L (ref 3.5–5.1)
SODIUM: 141 meq/L (ref 135–145)

## 2013-03-08 LAB — HEMOGLOBIN A1C: Hgb A1c MFr Bld: 8.4 % — ABNORMAL HIGH (ref 4.6–6.5)

## 2013-03-08 LAB — TSH: TSH: 6.94 u[IU]/mL — AB (ref 0.35–5.50)

## 2013-03-08 MED ORDER — METOPROLOL SUCCINATE ER 25 MG PO TB24: 25.0000 mg | ORAL_TABLET | Freq: Every day | ORAL | Status: DC

## 2013-03-08 NOTE — Progress Notes (Signed)
Subjective:    Patient ID: Jaclyn [name], female    DOB: Jun 17, 1935, 78 y.o.   MRN: [id number]  HPI  78 y/o WF with known hx of  multiple medical problems as noted below- followed for HBP, PAF, Carotid disease, Hyperchol, DM, Hypothy, FM, Vit D defic, anxiety/ depression... she is also followed by Jaclyn [name] for Cardiology, and Jaclyn [name] for GI...   03/08/2013 ER follow up  Was seen 1/5 in the ER for neck pain and headache . B/P was very high. Says she was in a lot of pain with weakness.  Labs were unrevealing w/ nml cbc, neg troponin , bmet unchanged  . CT head w/ no acute findings.  She has daypro and robaxin but has not been taking  Says her neck esp on right side is very sore, stiff at times , tenderness extends into upper right shoulder.  No fever, chest pain, dyspnea, visual/speech changes.  No arm or leg weakness.  Says headaches are on/off with dullness bilaterally all over at times.  She remains on Accupril for HTN - denies missed doses.  Denies sudafed use.           Problem List:  HYPERTENSION (ICD-401.9) - on MAVIK 2mg /d (she requests the non-generic Mavik because the generic "depresses me")...   ~  12/11:  BP today= 118/74 & feeling well, takes med regularly and tolerates well... denies visual changes, CP, palipit, dizziness, syncope, dyspnea, edema, etc...  ~  4/12:  BP= 134/64 & she continues to be essent asymptomatic...  CHEST PAIN, ATYPICAL (ICD-786.59) - she had cath for atyp CP 1/08 showing no signif CAD, EF= 60%... prev CWP part of her FM complex...  PAROXYSMAL ATRIAL FIBRILLATION (ICD-427.31) - followed by Jaclyn [name] & his notes are reviewed... she is holding NSR without palpit or arrhythmias noted... ~  2DEcho 5/05 was normal- no valve dis, normal wall thickness, norm EF...  CEREBROVASCULAR DISEASE (ICD-437.9) - on ASA 81mg /d... she has a right carotid bruit and bilat carotid stenoses> denies cerebral ischemic symptoms... ~  CDopplers 11/09 showed mod plaque in  the prox ICAs bilat R>L, 60-79%R & 40-59%L, stable- no change going back to 2005 studies. ~  5/10:  reminded to take her ASA daily. ~  f/u CDopplers 12/11 showed mod plaque, stable bilat carotid dis w/ [id number] RICA & [id number] LICA stenoses.  HYPERLIPIDEMIA (ICD-272.4) - on Zetia 10mg  +diet... she has refused Statin Rx- "It makes my bones hurt so bad"> [facility] but states she didn't tol Crestor5 "I hurt so bad- it's diff than my FM" & discomfort resolved off Rx... then tried Medon & titrated up to 3/d but her f/u FLP was no better... ~  [place] 12/07 showed TChol 192, TG 172, HDL 38, LDL 120... on diet alone... pt refuses Statin Rx. ~  [place] 9/09 showed TChol 230, TG 203, HDL 38, LDL 176... rec- [facility] ~  FLP 5/10 on FishOil showed TChol 231, TG 145, HDL 41, LDL 169 ~  FLP 12/11 showed TChol 244, TG 278, HDL 42, LDL 176...  try ZETIA 10mg /d. ~  FLP 4/12 on Zetia10 showed TChol 170, TG 175, HDL 41, LDL 94... Continue Zetia & diet...  DIABETES MELLITUS (ICD-250.00) - now on GLUCOVANCE 5-500 Bid and JANUVIA 100mg /d... ~  labs 2/09 showed BS= 180, HgA1c= 7.8.Marland KitchenMarland Kitchen on Glucov 2.5-500 taking 1 daily. ~  labs 9/09 showed BS= 219, HgA1c= 7.4... she ignored advise to incr to Bid. ~  discussed w/ pt 11/09: incr  to [place] 100mg /d... ~  labs 5/10 still on one of each (wt down 7# to 148#) showed BS= 167, A1c= 6.9 ~  labs 12/11 showed BS= 162, A1c= 8.6.Marland KitchenMarland Kitchen rec to incr Glucov5/500 Bid + Januv100/d. ~  Labs 4/12 showed BS=160, A1c=8.4.Marland KitchenMarland Kitchen rec incr to Glucov5/500- 2AM, 1PM; +Januv100/d.  HYPOTHYROIDISM (ICD-244.9) - now on SYNTHROID 178mcg/d...  ~  labs 11/08 showed TSH= 10.98... ?if taking med regularly? ~  labs 9/09 showed TSH= 0.63... rec- continue same dose. ~  labs 5/10 showed TSH= 0.49 ~  labs 12/11 showed TSH= 3.21  DIVERTICULOSIS OF COLON (ICD-562.10) & COLONIC POLYPS (ICD-211.3) - followed by Jaclyn [name]... last colonoscopy 3/05 w divertics and cecal villous  adenoma- referred to Jaclyn [name] w/ right colectomy 6/05 (no cancer in specimen).  SEBACEOUS CYST, BREAST (ICD-610.8) - infected seb cyst left chest wall treated in 2009 w/ Keflex & resolved.  DEGENERATIVE JOINT DISEASE (ICD-715.90) - Eval and Rx by Jaclyn [name] in 2007... calcif tendonitis in shoulders, DDD in CSpine... given Naprosyn & phys therapy... currently taking DAYPRO 600mg /d Prn...  FIBROMYALGIA (ICD-729.1) - on Daypro as noted... not interested in [place] [facility] consult... ~  She has chronic pain & trigger points assoc w/ the FM...  VITAMIN D DEFICIENCY (ICD-268.9) - she has had a very low Vit D level = 9 on 7/08 & 9/09... she was perscribed Vit D 50,000 u weekly but apparently never took this medication... we wrote it for her again w/ instructions to take it weekly until we tell her to stop, but she only took it for 4 weeks then stopped on her own in favor of 1000 u daily OTC supplement... ~  BMD 7/08 was WNL-  TScore in Spine= +0.6,  TScore in Hips= -0.8.Marland Kitchen. ~   f/u Vit D level 5/10= 29, therefore continue the Vit D 1000/d. ~  F/u Vit D level 4/12 = 29 & she admits not taking the OTC supplement... rec incr to 2000u daily.  BORDERLINE B 12 level > Vit B12 level 5/10 = 267 (Hg= 12.6, MCV= 89)... rec to start Vit B12 supplement 100-500 micrograms/d w/ f/u level on return...  ANXIETY DEPRESSION (ICD-300.4) - on LEXAPRO 20mg /d & notes that this really helps... INSOMNIA (ICD-780.52)   Past Surgical History  Procedure Laterality Date  . Vesicovaginal fistula closure w/ tah  1990    w/ cystocele &retocele repairs Jaclyn [name]  . Right colectomy  2005    for villous adenoma of the cecum Jaclyn [name]  . Cardiac catheterization  02/19/06    EF 60%  . Colonoscopy      Outpatient Encounter Prescriptions as of 03/08/2013  Medication Sig  . B Complex Vitamins (VITAMIN-B COMPLEX PO) Take 1 tablet by mouth daily.    . cholecalciferol (VITAMIN D) 1000 UNITS tablet Take 2,000 Units by mouth  daily.   Marland Kitchen escitalopram (LEXAPRO) 20 MG tablet TAKE 1 TABLET BY MOUTH ONCE A DAY  . glucose blood test strip 1 each by Other route as needed. Use as instructed check blood sugars once daily   . glyBURIDE-metformin (GLUCOVANCE) 5-500 MG per tablet TAKE 2 TABLETS BY MOUTH 2 (TWO) TIMES DAILY WITH A MEAL.  Marland Kitchen Insulin Glargine (LANTUS SOLOSTAR) 100 UNIT/ML SOPN Inject 30 Units into the skin at bedtime.   . Insulin Pen Needle 29G X 12.7MM MISC Use as directed  . levothyroxine (SYNTHROID, LEVOTHROID) 100 MCG tablet TAKE 1 TABLET BY MOUTH ONCE A DAY  . linagliptin (TRADJENTA) 5 MG TABS tablet Take 1  tablet (5 mg total) by mouth daily.  . methocarbamol (ROBAXIN) 500 MG tablet Take 1 tablet (500 mg total) by mouth every 8 (eight) hours as needed for muscle spasms.  Marland Kitchen oxaprozin (DAYPRO) 600 MG tablet 1 once daily as needed for joint pain take with food  . quinapril (ACCUPRIL) 20 MG tablet TAKE 1 TABLET BY MOUTH AT BEDTIME  . ranitidine (ZANTAC) 150 MG tablet Take 1 tablet (150 mg total) by mouth 2 (two) times daily.  . vitamin B-12 (CYANOCOBALAMIN) 100 MCG tablet Take 50 mcg by mouth daily.    . cyclobenzaprine (FLEXERIL) 5 MG tablet 1/2-1 tablet twice daily as needed for muscle spasms  . metoprolol succinate (TOPROL XL) 25 MG 24 hr tablet Take 1 tablet (25 mg total) by mouth daily.  Marland Kitchen trimethoprim (TRIMPEX) 100 MG tablet Take 1 tablet (100 mg total) by mouth at bedtime.    Allergies  Allergen Reactions  . Naproxen Sodium Other (See Comments)    Fever/aches and pains  . Sulfonamide Derivatives Hives    Review of Systems          Constitutional:   No  weight loss, night sweats,  Fevers, chills,  +fatigue, or  lassitude.  HEENT:   No headaches,  Difficulty swallowing,  Tooth/dental problems, or  Sore throat,                No sneezing, itching, ear ache, nasal congestion, post nasal drip,   CV:  No chest pain,  Orthopnea, PND, swelling in lower extremities, anasarca, dizziness, palpitations,  syncope.   GI  No heartburn, indigestion, abdominal pain, nausea, vomiting, diarrhea, change in bowel habits, loss of appetite, bloody stools.   Resp: No shortness of breath with exertion or at rest.  No excess mucus, no productive cough,  No non-productive cough,  No coughing up of blood.  No change in color of mucus.  No wheezing.  No chest wall deformity  Skin: no rash or lesions.  GU: no dysuria, change in color of urine, no urgency or frequency.  No flank pain, no hematuria   MS:  No joint  swelling.   ion.     Psych:  No change in mood or affect. No depression or anxiety.  No memory loss.         Objective:   Physical Exam       WD, WN, 78  y/o WF in NAD... GENERAL:  Alert & oriented; pleasant & cooperative... HEENT:  [place]/AT,  EACs-clear, TMs-wnl, NOSE-clear, THROAT-clear & wnl. NECK:  Supple w/ fairROM; no JVD; normal carotid impulses w/o bruits; no thyromegaly or nodules palpated; no lymphadenopathy., neg nuchal rigidity CHEST:  Clear to P & A; without wheezes/ rales/ or rhonchi. HEART:  Regular Rhythm; without murmurs/ rubs/ or gallops. ABDOMEN:  Soft & nontender; normal bowel sounds; no organomegaly or masses detected, no guarding or rebound,   EXT: without deformities, mild arthritic changes; no varicose veins/ venous insuffic/ or edema. Tenderness along right lateral/posterior neck , decreased ROM w/ flex/ext of neck. Tenderness along right upper back.  nml grips. , arm rom nml bilaterally + triggers points diffuse  NEURO:   gait normal & balance OK., CN 2-12 intact  DERM:  No lesions noted; no rash [email or web address]   Assessment & Plan:

## 2013-03-08 NOTE — Patient Instructions (Addendum)
Alternate ice and heat to neck  May use Daypro As needed  Neck and joint pain.  May use Robaxin 500mg  Three times a day  As needed  Muscle spasm.  Labs today .  Low salt diet  Begin Metoprolol 25mg  daily for your blood pressure  follow up [name]  In 6 weeks and As needed   Please contact office for sooner follow up if symptoms do not improve or worsen or seek emergency care

## 2013-03-08 NOTE — Assessment & Plan Note (Signed)
?  tension headache with Neck pain ? Muscle strain/DJD  Advised if neck pain persists may need referral to ortho  Plan  Alternate ice and heat to neck  May use Daypro As needed  Neck and joint pain.  May use Robaxin 500mg  Three times a day  As needed  Muscle spasm.  follow up [name]  In 6 weeks and As needed   Please contact office for sooner follow up if symptoms do not improve or worsen or seek emergency care

## 2013-03-08 NOTE — Assessment & Plan Note (Signed)
Check tsh 

## 2013-03-08 NOTE — Assessment & Plan Note (Signed)
Not at goal  Advised on low salt diet   Plan  Low salt diet  Begin Metoprolol 25mg  daily for your blood pressure  follow up [name]  In 6 weeks and As needed   Please contact office for sooner follow up if symptoms do not improve or worsen or seek emergency care

## 2013-03-08 NOTE — Assessment & Plan Note (Signed)
Advised on diet  Check A1C

## 2013-03-10 ENCOUNTER — Ambulatory Visit: Payer: Medicare Other | Admitting: Adult Health

## 2013-03-10 NOTE — Progress Notes (Signed)
Quick Note:  Called spoke with patient, advised of lab results / recs as stated by TP. Patient stated she has NOT been taking her synthroid regularly, has skipped quite a few doses thinking "it was too much." Advised pt she must take this medication every single day. Pt verbalized her understanding and denied any questions. ______

## 2013-03-14 ENCOUNTER — Ambulatory Visit: Payer: Medicare Other | Admitting: Cardiovascular Disease

## 2013-03-14 ENCOUNTER — Encounter (HOSPITAL_COMMUNITY): Payer: Medicare Other

## 2013-03-14 ENCOUNTER — Ambulatory Visit: Payer: Medicare Other | Admitting: Physician Assistant

## 2013-03-28 ENCOUNTER — Other Ambulatory Visit: Payer: Self-pay | Admitting: Pulmonary Disease

## 2013-03-31 ENCOUNTER — Encounter (HOSPITAL_COMMUNITY): Payer: Medicare Other

## 2013-03-31 ENCOUNTER — Ambulatory Visit: Payer: Medicare Other | Admitting: Cardiovascular Disease

## 2013-04-05 ENCOUNTER — Encounter (HOSPITAL_COMMUNITY): Payer: Medicare Other

## 2013-04-05 ENCOUNTER — Ambulatory Visit: Payer: Medicare Other | Admitting: Cardiovascular Disease

## 2013-04-20 ENCOUNTER — Encounter (HOSPITAL_COMMUNITY): Payer: Medicare Other

## 2013-04-20 ENCOUNTER — Ambulatory Visit: Payer: Medicare Other | Admitting: Cardiovascular Disease

## 2013-04-20 DIAGNOSIS — R0989 Other specified symptoms and signs involving the circulatory and respiratory systems: Secondary | ICD-10-CM

## 2013-04-22 ENCOUNTER — Telehealth: Payer: Self-pay | Admitting: Pulmonary Disease

## 2013-04-22 ENCOUNTER — Other Ambulatory Visit: Payer: Self-pay | Admitting: Pulmonary Disease

## 2013-04-22 MED ORDER — INSULIN GLARGINE 100 UNIT/ML SOLOSTAR PEN: 40.0000 [IU] | PEN_INJECTOR | Freq: Every day | SUBCUTANEOUS | Status: DC

## 2013-04-22 NOTE — Telephone Encounter (Signed)
Rx has been updated to show 40 units QHS per the pt's A1C blood work. This has been sent in. Pt is aware.

## 2013-05-03 ENCOUNTER — Encounter: Payer: Self-pay | Admitting: Cardiovascular Disease

## 2013-05-04 ENCOUNTER — Ambulatory Visit (INDEPENDENT_AMBULATORY_CARE_PROVIDER_SITE_OTHER): Payer: Medicare Other | Admitting: Pulmonary Disease

## 2013-05-04 ENCOUNTER — Encounter: Payer: Self-pay | Admitting: Pulmonary Disease

## 2013-05-04 ENCOUNTER — Other Ambulatory Visit (INDEPENDENT_AMBULATORY_CARE_PROVIDER_SITE_OTHER): Payer: Medicare Other

## 2013-05-04 VITALS — BP 120/72 | HR 72 | Temp 98.8°F | Ht 63.0 in | Wt 159.8 lb

## 2013-05-04 DIAGNOSIS — K5730 Diverticulosis of large intestine without perforation or abscess without bleeding: Secondary | ICD-10-CM

## 2013-05-04 DIAGNOSIS — IMO0001 Reserved for inherently not codable concepts without codable children: Secondary | ICD-10-CM

## 2013-05-04 DIAGNOSIS — F341 Dysthymic disorder: Secondary | ICD-10-CM

## 2013-05-04 DIAGNOSIS — I10 Essential (primary) hypertension: Secondary | ICD-10-CM

## 2013-05-04 DIAGNOSIS — E039 Hypothyroidism, unspecified: Secondary | ICD-10-CM

## 2013-05-04 DIAGNOSIS — E119 Type 2 diabetes mellitus without complications: Secondary | ICD-10-CM

## 2013-05-04 DIAGNOSIS — Z86010 Personal history of colonic polyps: Secondary | ICD-10-CM

## 2013-05-04 DIAGNOSIS — E785 Hyperlipidemia, unspecified: Secondary | ICD-10-CM

## 2013-05-04 DIAGNOSIS — M1990 Unspecified osteoarthritis, unspecified site: Secondary | ICD-10-CM

## 2013-05-04 DIAGNOSIS — I679 Cerebrovascular disease, unspecified: Secondary | ICD-10-CM

## 2013-05-04 DIAGNOSIS — K219 Gastro-esophageal reflux disease without esophagitis: Secondary | ICD-10-CM

## 2013-05-04 LAB — HEMOGLOBIN A1C: Hgb A1c MFr Bld: 8 % — ABNORMAL HIGH (ref 4.6–6.5)

## 2013-05-04 MED ORDER — METHOCARBAMOL 500 MG PO TABS: 500.0000 mg | ORAL_TABLET | Freq: Three times a day (TID) | ORAL | Status: DC | PRN

## 2013-05-04 NOTE — Progress Notes (Signed)
HPI  Review of Systems  Physical Exam  Subjective:    Patient ID: Jaclyn [name], female    DOB: March 14, 1935, 78 y.o.   MRN: [id number]  HPI 78 y/o WF here for a follow up visit... she has multiple medical problems as noted below- followed for HBP, PAF, Carotid disease, Hyperchol, DM, Hypothy, FM, Vit D defic, anxiety/ depression... she is also followed by [name] for Cardiology, and DrWeissman for GI.Marland Kitchen.  ~  June 10, 2010:  27mo ROV & stable, just incr stress w/ 76 y/o daughter's alcoholism and health issues;  She had labs done last week and FLP improved on the Zetia, but BS/ A1c about the same (on Glucovance & Januvia);  BP controlled & she denies CP/ palpit, etc > see prob list below>  ~  October 13, 2011:  22mo ROV & Jaclyn [name] was started on LANTUS 20u/d by TP last month when her BS=364 & A1c=9.7 on her Glucovance/ Januvia meds;  Since then her BS has improved & review of her meter shows BS 120-190 range;  We discussed maximizing the Glucovance 5/500 to 2Bid & continue the [place]; we plan f/u OV & labs in 2 months;  We reviewed low carb diet & exercise program...    She saw [name] last 1 yr ago for f/u of her HBP, Lipids, Carotid dis> hx normal cath in 2008, denies CP/ palpit/ SOB/ cerebral ischemic symptoms; no changes made.    Follow up CDopplers done last 4/13 & showed mod heterogeneous plaque bilat, 60-79% bilat ICAstenoses, patent vertebrals, f/u in 91mo...    She had numerous f/u visits w/ TP this past yr> UTI, problems w/ Zetia, she stopped her Synthroid (?why), nail fungus; all updated below...    She saw DrOttelin for Urology last 6/12> dysuria, hx IC, recurrent UTIs, mild urge incont; treated w/ Trimethoprim 100mg  Qhs which really helps & no infections this yr. We reviewed prob list, meds, xrays and labs> see below for updates >>  ~  January 20, 2012:  34mo ROV & Jaclyn [name] states that her home BS have been improved on the Lantus20... We reviewed the following medical  problems during today's office visit>>     HBP> on Accupril20; BP= 124/78 & she denies HA, CP, palpit, SOB, edema, etc...    AtypCP> reminded to take ASA81mg /d; she denies CP but is too sedentary & reminded to incr exercise program...    PAF> followed by [name] for Cards; holding NSR & denies CP, palpit, dizzy, etc...    Cerebrovasc Dis> should be taking ASA81mg /d; due for f/u CDopplers- she denies cerebral ischemic symptoms...     CHOL> on Zetia10 but only taking it 1-2 per week; she refuses statins or the [facility]; last FLP 4/13 on diet alone showed TChol 319, TG 345, HDL 54, LDL 213; she is not fasting today & says she will recheck her FLP when she sees [place] soon...     DM> on Glucov5/500-2Bid, Januv100, Lantus20; labs show BS=139, A1c=8.5 (improved from 9.7); Rec to incr Lantus to 25u/d...    Hypothy> on Synthroid100; she is clinically euthyroid & her last TSH 7/13 = 2.71...    GI- Divertics, Polyps> she has villous adenoma in cecum removed via right hemicolectomy 6/05; last colonoscopy was 10/09 by DrWeissman- no recurrent polyp, f/u 46yrs.    GU- UTIs> on [id number]; followed by DrOttelin, doing satis w/o recurrent infections...     DJD, FM> on MVI, VitD1000; she uses OTC analgesics as needed...    B12  Defic> on B12500/d; last B12 level was 476 Apr'13...    Anxiety, Depression> on Lexapro20; she feels that she is doing well on this med... We reviewed prob list, meds, xrays and labs> see below for updates >> OK Flu shot today... LABS 12/13:  Chems- ok w/ BS=139, A1c=8.5 >> on Lantus20 & rec to incr to 25u...   ~  October 08, 2012:  8-61mo ROV & Jaclyn [name] is not taking her meds regularly making medication adjustment very difficult & a real challenge for her health care team;  We reviewed the following medical problems during today's office visit >>     HBP> on Accupril20; BP= 112/70 & she denies HA, CP, palpit, SOB, edema, etc...    AtypCP> reminded to take ASA81mg /d; she denies CP but is  too sedentary & reminded to incr exercise program...    PAF> followed by Walker Kehr for Cards; last seen 12/13- holding NSR & denies CP, palpit, dizzy, etc; f/u 74yr...    Cerebrovasc Dis> should be taking ASA81mg /d; CDopplers 12/13 showed bilat irreg heterogeneous plaque- 60-79% RICAstenosis & 40-59% LICAstenosis; she denies cerebral ischemic symptoms...    CHOL> on Zetia10 but only taking it 1-2 per week; she refuses statins or the [facility]; last FLP 8/14 shows TChol 202, TG 231, HDL 39, LDL 142=> asked to take Zetia every day & get on low fat diet!!!    DM> on Lantus25, Glucov5/500-2Bid, Januv100; labs 8/14 show BS=229, A1c=8.4; she wants off Januvia since several extended family members w/ pancreatic ca=> Rec incr Lantus to 30, ch Januv to [place]; take all meds every day!!!    Hypothy> on Synthroid100; she is clinically euthyroid & her last TSH 7/13 = 2.71 on this dose; Labs today 8/14 showed TSH=8.19 & I suspect poor compliance, asked to take it every day 1st thing in the AM...    GI- GERD, Divertics, Polyps> she has villous adenoma in cecum removed via right hemicolectomy 6/05; last colonoscopy was 10/09 by DrWeissman- no recurrent polyp, f/u 44yrs;  She saw [facility] 8/14 c/o GERD, abd pain, ch in stools- rec to take ZANTAC150Bid & sched for EGD/ Colon on 10/29/12 w/ DrGessner...    Elev LFTs> Labs show worsening LFTs w/ labs 8/14 showing SGOT=87, SGPT=68; we will check Sonar & rec low fat diet & wt reduction w/ careful clinical follow up...    GU- UTIs> on [id number]; followed by DrOttelin, 8/14 she is c/o recent incr symptoms (burning, discomfort) & UA shows UTI- treat w/ Cipro...    DJD, FM> on MVI, VitD1000; she uses Daypro600 & OTC analgesics as needed...    B12 Defic> on B12-500/d orally but I wonder if compliant; B12 level 8/14 = 355, therefore continue oral supplement...    Anxiety, Depression> on Lexapro20; she feels that she is doing well on this med... We reviewed prob list, meds, xrays  and labs> see below for updates >>  LABS 8/14:  FLP- improved on Zetia;  Chems- ok x BS=229 A1c=8.4;  LFTs w/ elev SGOT87/ SGPT68;  CBC- wnl;  TSH=8.19;  VitB-12=355;  VitD=38;  UA w/ UTI noted... ADDENDUM >> AbdSonar 8/28 showed diffuse increased echogenicity of the liver and decreased through transmission consistent with fatty infiltration; advised about Steatosis & need for low fat diet, wt reduction, avoid hepatotoxins...  ~  May 04, 2013:  39mo ROV & Jaclyn [name] thinks the Metoprolol is making her retain fluid (feels it in her hands); We reviewed the following medical problems during today's office visit >>  HBP is treated w/ MetopER25 & Accupril20; BP= 120/72 & she denies HA, visual symptoms, CP, palpit, ch in SOB/ edema...     She is supposed to be taking ASA81; she has carotid art dis and denies any cerebral ischemic symptoms; last CDoppler was 12/13 & she is overdue for f/u study & PV follow up...    She has stopped her Zetia, refuses statin rx & last FLP 8/14 not at goals; she is not fasting today & wants to wait for f/u FLP...    DM treated w/ Lantus40, Glucovance5-500(2Bid), Tradjenta5 but compliance is suspect; Labs 3/15 showed BS=133, A1c=8.0 and we reviewed diet, exercise, take meds every day...    On Synthroid100 & again reminded to take 1st thing in the AM, daily; Lab 3/15 showed TSH=5.97    She has hx deopression on Lexapro20 but feels she is still depressed, med not working- refer to [employer], [employer]... We reviewed prob list, meds, xrays and labs> see below for updates >>  LABS 3/15:  Chems- ok x BS=133, A1c=8.0;  TSH=5.97 on Synthroid100...          Problem List:  HYPERTENSION (ICD-401.9) - prev on Mavik 2mg /d but switched to ACCUPRIL 20mg /d to save $$ ~  12/11:  BP= 118/74 on Mavik2 & feeling well, takes med regularly and tolerates well... denies visual changes, CP, palipit, dizziness, syncope, dyspnea, edema, etc...  ~  4/12:  BP= 134/64 & she continues to be essent  asymptomatic... ~  8/13:  BP= 124/68 on Quinapril20 now; denies HA, cough, CP, palpit, SOB, edema, etc... ~  12/13: on Accupril20; BP= 124/78 & she denies HA, CP, palpit, SOB, edema, etc...  ~  8/14: on Accupril20; BP= 112/70 & she denies HA, CP, palpit, SOB, edema, etc... ~  3/15: HBP is treated w/ MetopER25 & Accupril20; BP= 120/72 & she denies HA, visual symptoms, CP, palpit, ch in SOB/ edema.   CHEST PAIN, ATYPICAL (ICD-786.59) - she had cath for atyp CP 1/08 showing no signif CAD, EF= 60%... prev CWP part of her FM complex... ~  She has MULT coronary risk factors w/ HBP, DM, Chol, & known carotid vasc dis... ~  CXR 12/11 showed normal heart size, mild peribronch thickening, clear lungs, NAD... ~  Followed by [name] & seen 12/13- HBP, Hyperlipid, AtypCP, Carotid stenoses; neg cath 2008- no changes made...  PAROXYSMAL ATRIAL FIBRILLATION (ICD-427.31) - followed by [name] & his notes are reviewed... she is holding NSR without palpit or arrhythmias noted... ~  2DEcho 5/05 was normal- no valve dis, normal wall thickness, norm EF... ~  Holding NSR w/o PAF episodes suspected...  CEREBROVASCULAR DISEASE (ICD-437.9) - on ASA 81mg /d... she has a right carotid bruit and bilat carotid stenoses> denies cerebral ischemic symptoms... ~  CDopplers 11/09 showed mod plaque in the prox ICAs bilat R>L, 60-79%R & 40-59%L, stable- no change going back to 2005 studies. ~  5/10:  reminded to take her ASA daily. ~  f/u CDopplers 12/11 showed mod plaque, stable bilat carotid dis w/ [id number] RICA & [id number] LICA stenoses. ~  f/u CDopplers 4/13 showed mod heterogeneous plaque bilat, 60-79% bilat ICAstenoses, patent vertebrals, f/u in 28mo... ~  12/13: she is due to f/u CDopplers but wants to wait til f/u appt w/ DrNishan soon=> 60-79% RICAstenosis & 40-59%LICAstenosis w/ bilat irreg heterogeneous plaque, f/u 103yr. ~  3/15: She is supposed to be taking ASA81; she has carotid art dis and denies any cerebral ischemic  symptoms; last CDoppler was 12/13 & she  is overdue for f/u study & PV follow up.  HYPERLIPIDEMIA (ICD-272.4) - on Zetia 10mg  +diet... she has refused Statin Rx- "It makes my bones hurt so bad"> went to the [facility] but states she didn't tol Crestor5 "I hurt so bad- it's diff than my FM" & discomfort resolved off Rx... then tried [place] & titrated up to 3/d but her f/u FLP was no better... ~  [place] 12/07 showed TChol 192, TG 172, HDL 38, LDL 120... on diet alone... pt refuses Statin Rx. ~  [place] 9/09 showed TChol 230, TG 203, HDL 38, LDL 176... rec- refer to [facility] ~  FLP 5/10 on [place] showed TChol 231, TG 145, HDL 41, LDL 169 ~  FLP 12/11 showed TChol 244, TG 278, HDL 42, LDL 176...  try ZETIA 10mg /d. ~  FLP 4/12 on Zetia10 showed TChol 170, TG 175, HDL 41, LDL 94... Continue Zetia & diet... ~  Pt says she developed increased aching, soreness, pain & cut back the Zetia to MWF, then stopped it on her own, also stopped her Synthroid on her own... ~  FLP 4/13 off meds showed TChol 319, TG 345, HDL 54, LDL 213... her TSH off Synthroid was 55 & she refused Chol meds but agreed to restart SYNTHROID 100/d. ~  8/13: pt says she tried the Zetia again but INTOL "it made me feel like I have dementia"; she is not fasting today> she mentioned that a lady at [facility] recommended "ginger pills and flax seed cookies" so she wants to try these next... ~  8/14: not taking Zetia every day & f/u FLP showed TChol 202, TG 231, HDL 39, LDL 142=> asked to take Zetia every day & get on low fat diet!!! ~  3/15: She has stopped her Zetia, refuses statin rx & last FLP 8/14 not at goals; she is not fasting today & wants to wait for f/u FLP   DIABETES MELLITUS (ICD-250.00) >>  ~  on GLUCOVANCE 5-500 2AM & 1PM and JANUVIA 100mg /d... ~  labs 2/09 showed BS= 180, HgA1c= 7.8.Marland KitchenMarland Kitchen on Glucov 2.5-500 taking 1 daily. ~  labs 9/09 showed BS= 219, HgA1c= 7.4... she ignored advise to incr to Bid; Januvia100 added. ~  labs 5/10 still  on one of each (wt down 7# to 148#) showed BS= 167, A1c= 6.9 ~  labs 12/11 showed BS= 162, A1c= 8.6.Marland KitchenMarland Kitchen rec to incr Glucov5/500 Bid + Januv100/d. ~  Labs 4/12 showed BS=160, A1c=8.4.Marland KitchenMarland Kitchen rec incr to Glucov5/500- 2AM, 1PM; +Januv100/d. ~  Labs 4/13 showed BS= 281, A1c=9.4... But she had stopped mult meds including Synthroid=> told to restart meds. ~  Labs 7/13 showed BS= 364, A1c= 9.7.Marland KitchenMarland Kitchen Pt started on LANTUS 20u daily, and subseq Glucovance incr to 2Bid. ~  Labs 12/13 on Glucov5/500-2Bid, Januv100, Lantus20 showed BS=139 A1c=8.5; rec to incr Lantus25 & take meds every day... ~  8/14: still not taking meds every day> on Lantus25, Glucov5/500-2Bid, Januv100; BS=229 A1c=8.4 & rec to incr Lantus30, she refused Januv therefore ch to Tradjenta5, take meds every day!! ~  3/15: DM treated w/ Lantus40, Glucovance5-500(2Bid), Tradjenta5 but compliance is suspect; Labs 3/15 showed BS=133, A1c=8.0 and we reviewed diet, exercise, take meds every day.   HYPOTHYROIDISM (ICD-244.9) - on SYNTHROID 122mcg/d...  ~  labs 11/08 showed TSH= 10.98... ?if taking med regularly? ~  labs 9/09 showed TSH= 0.63... rec- continue same dose. ~  labs 5/10 showed TSH= 0.49 ~  labs 12/11 showed TSH= 3.21 ~  Labs 4/13 showed TSH= 55.Marland KitchenMarland Kitchen  Pt had stopped Synthroid on her own!! ~  Labs 7/13 on Synth100 showed TSH= 2.71 ~  Labs 8/14 on Synthroid100 showed TSH= 8.19... Reminded to take it everyday 1st thing in the am. ~  3/15: On Synthroid100 & again reminded to take 1st thing in the AM, daily; Lab 3/15 showed TSH=5.97   DIVERTICULOSIS OF COLON (ICD-562.10) & COLONIC POLYPS (ICD-211.3) - followed by DrWeissman... last colonoscopy 3/05 w divertics and cecal villous adenoma- referred to DrStreck w/ right colectomy 6/05 (no cancer in specimen)... ~  Follow up colonoscopy 10/09 by DrWeissman> patent end-to-end ileo-colonic anastomosis, divertics in desc & sigmoid regions, +hemorrhoids; f/u 5 yrs...  SEBACEOUS CYST, BREAST (ICD-610.8) - infected  seb cyst left chest wall treated in 2009 w/ Keflex & resolved.  DEGENERATIVE JOINT DISEASE (ICD-715.90) - Eval and Rx by DrRamos in 2007... calcif tendonitis in shoulders, DDD in CSpine... given Naprosyn & phys therapy... currently taking DAYPRO 600mg /d Prn...  FIBROMYALGIA (ICD-729.1) - on Daypro as noted... not interested in [facility] consult... ~  She has chronic pain & trigger points assoc w/ the FM...  VITAMIN D DEFICIENCY (ICD-268.9) - she has had a very low Vit D level = 9 on 7/08 & 9/09... she was perscribed Vit D 50,000 u weekly but apparently never took this medication... we wrote it for her again w/ instructions to take it weekly until we tell her to stop, but she only took it for 4 weeks then stopped on her own in favor of 1000 u daily OTC supplement... ~  BMD 7/08 was WNL-  TScore in Spine= +0.6,  TScore in Hips= -0.8.Marland Kitchen. ~   f/u Vit D level 5/10= 29, therefore continue the Vit D 1000/d. ~  F/u Vit D level 4/12 = 29 & she admits not taking the OTC supplement... rec incr to 2000u daily. ~  Labs 4/13 showed Vit D level = 18 & she is reminded to take the supplement every day...  ~  Labs 8/14 showed Vit D level = 38... Continue the 2000u daily supplement...  BORDERLINE B-12 level > Vit B12 level 5/10 = 267 (Hg= 12.6, MCV= 89)... rec to start Vit B12 supplement 100-500 micrograms/d w/ f/u level on return... ~  Labs 4/13 showed B12 level = 476... Continue same. ~  Labs 8/14 showed B12 level = 355... rec to continue B12 supplement ~584mcg daily...  ANXIETY DEPRESSION (ICD-300.4) - on LEXAPRO 20mg /d & notes that this really helps... INSOMNIA (ICD-780.52)   Past Surgical History  Procedure Laterality Date  . Vesicovaginal fistula closure w/ tah  1990    w/ cystocele &retocele repairs Dr. Ree Edman  . Right colectomy  2005    for villous adenoma of the cecum Dr.streck  . Cardiac catheterization  02/19/06    EF 60%  . Colonoscopy      Outpatient Encounter Prescriptions as  of 05/04/2013  Medication Sig  . B Complex Vitamins (VITAMIN-B COMPLEX PO) Take 1 tablet by mouth daily.    . cholecalciferol (VITAMIN D) 1000 UNITS tablet Take 2,000 Units by mouth daily.   Marland Kitchen escitalopram (LEXAPRO) 20 MG tablet TAKE 1 TABLET BY MOUTH ONCE A DAY  . glucose blood test strip 1 each by Other route as needed. Use as instructed check blood sugars once daily   . glyBURIDE-metformin (GLUCOVANCE) 5-500 MG per tablet TAKE 2 TABLETS BY MOUTH 2 (TWO) TIMES DAILY WITH A MEAL.  Marland Kitchen Insulin Glargine (LANTUS SOLOSTAR) 100 UNIT/ML Solostar Pen Inject 40 Units into the skin at  bedtime.  . Insulin Pen Needle 29G X 12.7MM MISC Use as directed  . levothyroxine (SYNTHROID, LEVOTHROID) 100 MCG tablet TAKE 1 TABLET BY MOUTH ONCE A DAY  . linagliptin (TRADJENTA) 5 MG TABS tablet Take 1 tablet (5 mg total) by mouth daily.  . methocarbamol (ROBAXIN) 500 MG tablet Take 1 tablet (500 mg total) by mouth every 8 (eight) hours as needed for muscle spasms.  . metoprolol succinate (TOPROL XL) 25 MG 24 hr tablet Take 1 tablet (25 mg total) by mouth daily.  Marland Kitchen oxaprozin (DAYPRO) 600 MG tablet 1 once daily as needed for joint pain take with food  . quinapril (ACCUPRIL) 20 MG tablet TAKE 1 TABLET BY MOUTH AT BEDTIME  . ranitidine (ZANTAC) 150 MG tablet Take 1 tablet (150 mg total) by mouth 2 (two) times daily.  . vitamin B-12 (CYANOCOBALAMIN) 100 MCG tablet Take 50 mcg by mouth daily.    . [DISCONTINUED] cyclobenzaprine (FLEXERIL) 5 MG tablet 1/2-1 tablet twice daily as needed for muscle spasms  . [DISCONTINUED] trimethoprim (TRIMPEX) 100 MG tablet Take 1 tablet (100 mg total) by mouth at bedtime.    Allergies  Allergen Reactions  . Sulfonamide Derivatives     Review of Systems         See HPI - all other systems neg except as noted... The patient complains of headaches.  The patient denies anorexia, fever, weight loss, weight gain, vision loss, decreased hearing, hoarseness, chest pain, syncope, dyspnea on  exertion, peripheral edema, prolonged cough, hemoptysis, abdominal pain, melena, hematochezia, severe indigestion/heartburn, hematuria, incontinence, muscle weakness, suspicious skin lesions, transient blindness, difficulty walking, depression, unusual weight change, abnormal bleeding, enlarged lymph nodes, and angioedema.     Objective:   Physical Exam      WD, WN, 78 y/o WF in NAD... GENERAL:  Alert & oriented; pleasant & cooperative... HEENT:  [place]/AT, EOM-wnl, PERRLA, EACs-clear, TMs-wnl, NOSE-clear, THROAT-clear & wnl. NECK:  Supple w/ fairROM; no JVD; normal carotid impulses w/o bruits; no thyromegaly or nodules palpated; no lymphadenopathy. CHEST:  Clear to P & A; without wheezes/ rales/ or rhonchi. HEART:  Regular Rhythm; without murmurs/ rubs/ or gallops. ABDOMEN:  Soft & nontender; normal bowel sounds; no organomegaly or masses detected, no guarding or rebound, neg CVA tenderness  EXT: without deformities, mild arthritic changes; no varicose veins/ venous insuffic/ or edema. NEURO:  CN's intact; motor testing normal; sensory testing normal; gait normal & balance OK. DERM:  No lesions noted; no rash [email or web address]  RADIOLOGY DATA:  Reviewed in the EPIC EMR & discussed w/ the patient...  LABORATORY DATA:  Reviewed in the EPIC EMR & discussed w/ the patient...   Assessment & Plan:    HBP>  Controlled on Accupril20, continue same + diet & no sodium...  CP/ FM>  She has had atypCP as part of her severe FM problem;  Recently stable w/o pain exac & doing satis on Daypro/ Tylenol/ etc...  Hx AFib>  Holding NSR, remains regular w/o CP, palpit, racing, etc... She knows to avoid caffeine etc...  Cerebrovasc dis>  Stable on ASA without cerebral ischemic symptoms; CDopplers followed at the [facility] lab & showed stable bilat ICA stenosis, f/u 41yr...  Hyperlipid>  Nice response to Zetia 10mg  but needs to take it every day, get on low fat diet, and lose wt!!!  DM>  She is not taking meds  regularly making control impossible; we discussed this & asked to incr Lantus30, take [place] (pt refuses this med) to  Tradjenta5mg /d...  Increased LFTs>  Routine labs w/ elev SGOT/ SGPT- we will check Abd sonar- rec diet exercise, wt reduction...  Vit D defic>  She really needs to take the supplement, and asked to incr to 2000 u daily...  Anxiety>  She likes the Lexapro & gets $$ help w/ this & wants to continue...   Patient's Medications  New Prescriptions   No medications on file  Previous Medications   B COMPLEX VITAMINS (VITAMIN-B COMPLEX PO)    Take 1 tablet by mouth daily.     CHOLECALCIFEROL (VITAMIN D) 1000 UNITS TABLET    Take 2,000 Units by mouth daily.    ESCITALOPRAM (LEXAPRO) 20 MG TABLET    TAKE 1 TABLET BY MOUTH ONCE A DAY   GLUCOSE BLOOD TEST STRIP    1 each by Other route as needed. Use as instructed check blood sugars once daily    GLYBURIDE-METFORMIN (GLUCOVANCE) 5-500 MG PER TABLET    TAKE 2 TABLETS BY MOUTH 2 (TWO) TIMES DAILY WITH A MEAL.   INSULIN GLARGINE (LANTUS SOLOSTAR) 100 UNIT/ML SOLOSTAR PEN    Inject 40 Units into the skin at bedtime.   INSULIN PEN NEEDLE 29G X 12.7MM MISC    Use as directed   LEVOTHYROXINE (SYNTHROID, LEVOTHROID) 100 MCG TABLET    TAKE 1 TABLET BY MOUTH ONCE A DAY   LINAGLIPTIN (TRADJENTA) 5 MG TABS TABLET    Take 1 tablet (5 mg total) by mouth daily.   METHOCARBAMOL (ROBAXIN) 500 MG TABLET    Take 1 tablet (500 mg total) by mouth every 8 (eight) hours as needed for muscle spasms.   METOPROLOL SUCCINATE (TOPROL XL) 25 MG 24 HR TABLET    Take 1 tablet (25 mg total) by mouth daily.   OXAPROZIN (DAYPRO) 600 MG TABLET    1 once daily as needed for joint pain take with food   QUINAPRIL (ACCUPRIL) 20 MG TABLET    TAKE 1 TABLET BY MOUTH AT BEDTIME   RANITIDINE (ZANTAC) 150 MG TABLET    Take 1 tablet (150 mg total) by mouth 2 (two) times daily.   VITAMIN B-12 (CYANOCOBALAMIN) 100 MCG TABLET    Take 50 mcg by mouth daily.     Modified Medications   No medications on file  Discontinued Medications   CYCLOBENZAPRINE (FLEXERIL) 5 MG TABLET    1/2-1 tablet twice daily as needed for muscle spasms   TRIMETHOPRIM (TRIMPEX) 100 MG TABLET    Take 1 tablet (100 mg total) by mouth at bedtime.

## 2013-05-04 NOTE — Patient Instructions (Signed)
Today we updated your med list in our EPIC system...    Continue your current medications the same...  We refilled the meds you requested...  For your neck pain & HAs>>    Apply heat to the neck as needed...    Use the robaxin muscle relaxer- 500mg  one tab up to 3 times daily as needed...    Use the Daypro 600mg  daily for the pain as this seems to work well for you...  For your depression>>    Continue the Lexapro 20mg  daily...    We will arrange for a referral to Psychiatry to see if a combination of meds would work better for you, and to consider counseling...  You are also overdue for a follow up Carotid doppler eval and check up w/ your cardiologist [name]- we will set this up for you...  Call for any questions or if we can be of service in any way.Marland KitchenMarland Kitchen

## 2013-05-05 LAB — BASIC METABOLIC PANEL
BUN: 12 mg/dL (ref 6–23)
CHLORIDE: 107 meq/L (ref 96–112)
CO2: 22 mEq/L (ref 19–32)
Calcium: 9.2 mg/dL (ref 8.4–10.5)
Creatinine, Ser: 0.9 mg/dL (ref 0.4–1.2)
GFR: 68.24 mL/min (ref >60.00)
Glucose, Bld: 133 mg/dL — ABNORMAL HIGH (ref 70–99)
POTASSIUM: 4 meq/L (ref 3.5–5.1)
Sodium: 142 mEq/L (ref 135–145)

## 2013-05-05 LAB — TSH: TSH: 5.97 u[IU]/mL — ABNORMAL HIGH (ref 0.35–5.50)

## 2013-05-11 ENCOUNTER — Telehealth: Payer: Self-pay | Admitting: Pulmonary Disease

## 2013-05-11 MED ORDER — GLYBURIDE-METFORMIN 5-500 MG PO TABS: ORAL_TABLET | ORAL | Status: DC

## 2013-05-11 MED ORDER — LINAGLIPTIN 5 MG PO TABS: 5.0000 mg | ORAL_TABLET | Freq: Every day | ORAL | Status: DC

## 2013-05-11 NOTE — Telephone Encounter (Signed)
Called and spoke with pt and she is aware of lab results per SN.  Pt is aware of her refills that have been sent to the pharmacy.  Pt stated that she is taking her meds daily.  Nothing further is needed.

## 2013-05-27 ENCOUNTER — Ambulatory Visit (INDEPENDENT_AMBULATORY_CARE_PROVIDER_SITE_OTHER): Payer: Medicare Other | Admitting: Cardiovascular Disease

## 2013-05-27 ENCOUNTER — Ambulatory Visit (HOSPITAL_COMMUNITY): Payer: Medicare Other | Attending: Cardiovascular Disease | Admitting: *Deleted

## 2013-05-27 VITALS — BP 120/64 | HR 72 | Ht 63.0 in | Wt 156.4 lb

## 2013-05-27 DIAGNOSIS — I10 Essential (primary) hypertension: Secondary | ICD-10-CM

## 2013-05-27 DIAGNOSIS — E785 Hyperlipidemia, unspecified: Secondary | ICD-10-CM

## 2013-05-27 DIAGNOSIS — I679 Cerebrovascular disease, unspecified: Secondary | ICD-10-CM

## 2013-05-27 DIAGNOSIS — I6529 Occlusion and stenosis of unspecified carotid artery: Secondary | ICD-10-CM

## 2013-05-27 DIAGNOSIS — I4891 Unspecified atrial fibrillation: Secondary | ICD-10-CM

## 2013-05-27 DIAGNOSIS — IMO0001 Reserved for inherently not codable concepts without codable children: Secondary | ICD-10-CM

## 2013-05-27 DIAGNOSIS — E119 Type 2 diabetes mellitus without complications: Secondary | ICD-10-CM

## 2013-05-27 NOTE — Assessment & Plan Note (Signed)
Discussed low carb diet.  Target hemoglobin A1c is 6.5 or less.  Continue current medications.  

## 2013-05-27 NOTE — Assessment & Plan Note (Signed)
Well controlled.  Continue current medications and low sodium Dash type diet.    

## 2013-05-27 NOTE — Assessment & Plan Note (Signed)
Stable carotids  60-79% RICA stenosis.  F/U carotid duplex in 12 months

## 2013-05-27 NOTE — Patient Instructions (Signed)
Your physician wants you to follow-up in:  [place] will receive a reminder letter in the mail two months in advance. If you don't receive a letter, please call our office to schedule the follow-up appointment. Your physician recommends that you continue on your current medications as directed. Please refer to the Current Medication list given to you today. Your physician has requested that you have a carotid duplex. This test is an ultrasound of the carotid arteries in your neck. It looks at blood flow through these arteries that supply the brain with blood. Allow one hour for this exam. There are no restrictions or special instructions.

## 2013-05-27 NOTE — Progress Notes (Signed)
Patient ID: Jaclyn [name], female   DOB: 05-22-1935, 78 y.o.   MRN: [id number] Jaclyn [name] is seen today fo rf/U of HTN, elevated lipids previous atypical SSCP and right carotid bruit. She's had a normal cath in 2008. She has known RICA dx. I reviewed her duplex from today  and she has stable 60-79% RICA stenosis (440/10), and 2-72% LICA stenosis. She has had no recurrent SSCP, no TIA like symptoms. She has been compliant with her meds. She is intolerant to statins. She needs to have her cholesterol checked at Dr. [name]. Othewise she is doing well Some fibromyalgia and neuropatthy. No history of PVC or claudication.   Still with somewhat poorly controlled DM  A1c in 8 range  Poor diet    ROS: Denies fever, malais, weight loss, blurry vision, decreased visual acuity, cough, sputum, SOB, hemoptysis, pleuritic pain, palpitaitons, heartburn, abdominal pain, melena, lower extremity edema, claudication, or rash.  All other systems reviewed and negative  General: Affect appropriate Healthy:  appears stated age 57: normal Neck supple with no adenopathy JVP normal no bruits no thyromegaly Lungs clear with no wheezing and good diaphragmatic motion Heart:  S1/S2 no murmur, no rub, gallop or click PMI normal Abdomen: benighn, BS positve, no tenderness, no AAA no bruit.  No HSM or HJR Distal pulses intact with no bruits No edema Neuro non-focal Skin warm and dry No muscular weakness   Current Outpatient Prescriptions  Medication Sig Dispense Refill  . B Complex Vitamins (VITAMIN-B COMPLEX PO) Take 1 tablet by mouth daily.        . cholecalciferol (VITAMIN D) 1000 UNITS tablet Take 2,000 Units by mouth daily.       Marland Kitchen escitalopram (LEXAPRO) 20 MG tablet TAKE 1 TABLET BY MOUTH ONCE A DAY  30 tablet  6  . glucose blood test strip 1 each by Other route as needed. Use as instructed check blood sugars once daily       . glyBURIDE-metformin (GLUCOVANCE) 5-500 MG per tablet TAKE 2 TABLETS BY MOUTH 2 (TWO)  TIMES DAILY WITH A MEAL.  120 tablet  6  . Insulin Glargine (LANTUS SOLOSTAR) 100 UNIT/ML Solostar Pen Inject 40 Units into the skin at bedtime.  15 mL  5  . Insulin Pen Needle 29G X 12.7MM MISC Use as directed  100 each  5  . levothyroxine (SYNTHROID, LEVOTHROID) 100 MCG tablet TAKE 1 TABLET BY MOUTH ONCE A DAY  30 tablet  5  . linagliptin (TRADJENTA) 5 MG TABS tablet Take 1 tablet (5 mg total) by mouth daily.  30 tablet  6  . methocarbamol (ROBAXIN) 500 MG tablet Take 1 tablet (500 mg total) by mouth every 8 (eight) hours as needed for muscle spasms.  90 tablet  1  . metoprolol succinate (TOPROL XL) 25 MG 24 hr tablet Take 1 tablet (25 mg total) by mouth daily.  30 tablet  5  . oxaprozin (DAYPRO) 600 MG tablet 1 once daily as needed for joint pain take with food  30 tablet  6  . quinapril (ACCUPRIL) 20 MG tablet TAKE 1 TABLET BY MOUTH AT BEDTIME  30 tablet  6  . ranitidine (ZANTAC) 150 MG tablet Take 1 tablet (150 mg total) by mouth 2 (two) times daily.  60 tablet  2  . vitamin B-12 (CYANOCOBALAMIN) 100 MCG tablet Take 50 mcg by mouth daily.         No current facility-administered medications for this visit.    Allergies  Naproxen sodium and  Sulfonamide derivatives  Electrocardiogram:  SR LAFB  Poor R wave progression   Assessment and Plan

## 2013-05-27 NOTE — Assessment & Plan Note (Signed)
Cholesterol is at goal.  Continue current dose of statin and diet Rx.  No myalgias or side effects.  F/U  LFT's in 6 months. Lab Results  Component Value Date   LDLCALC 94 06/06/2010  Labs with primary

## 2013-05-27 NOTE — Assessment & Plan Note (Signed)
Maint NSR  No palpitations stable

## 2013-05-27 NOTE — Progress Notes (Signed)
Carotid duplex complete 

## 2013-05-27 NOTE — Assessment & Plan Note (Signed)
Suggested she talk to her primary about switching to cymbalta as this tends to be more effective for fibromyalgia

## 2013-06-06 ENCOUNTER — Other Ambulatory Visit: Payer: Self-pay | Admitting: Pulmonary Disease

## 2013-07-22 ENCOUNTER — Other Ambulatory Visit: Payer: Self-pay | Admitting: Family Medicine

## 2013-07-26 ENCOUNTER — Telehealth: Payer: Self-pay | Admitting: Pulmonary Disease

## 2013-07-26 MED ORDER — QUINAPRIL HCL 20 MG PO TABS: 20.0000 mg | ORAL_TABLET | Freq: Every day | ORAL | Status: DC

## 2013-07-26 NOTE — Telephone Encounter (Signed)
Rx was refilled x 1 only  LMOM for pt to be made aware

## 2013-08-04 ENCOUNTER — Ambulatory Visit (INDEPENDENT_AMBULATORY_CARE_PROVIDER_SITE_OTHER): Payer: Medicare Other | Admitting: Family Medicine

## 2013-08-04 ENCOUNTER — Encounter: Payer: Self-pay | Admitting: Family Medicine

## 2013-08-04 VITALS — BP 138/60 | HR 102 | Temp 98.1°F | Ht 63.0 in | Wt 157.0 lb

## 2013-08-04 DIAGNOSIS — E119 Type 2 diabetes mellitus without complications: Secondary | ICD-10-CM

## 2013-08-04 DIAGNOSIS — E039 Hypothyroidism, unspecified: Secondary | ICD-10-CM

## 2013-08-04 DIAGNOSIS — IMO0001 Reserved for inherently not codable concepts without codable children: Secondary | ICD-10-CM

## 2013-08-04 DIAGNOSIS — E785 Hyperlipidemia, unspecified: Secondary | ICD-10-CM

## 2013-08-04 DIAGNOSIS — I679 Cerebrovascular disease, unspecified: Secondary | ICD-10-CM

## 2013-08-04 DIAGNOSIS — K7689 Other specified diseases of liver: Secondary | ICD-10-CM

## 2013-08-04 DIAGNOSIS — I6529 Occlusion and stenosis of unspecified carotid artery: Secondary | ICD-10-CM

## 2013-08-04 DIAGNOSIS — K760 Fatty (change of) liver, not elsewhere classified: Secondary | ICD-10-CM

## 2013-08-04 DIAGNOSIS — R0789 Other chest pain: Secondary | ICD-10-CM

## 2013-08-04 DIAGNOSIS — I10 Essential (primary) hypertension: Secondary | ICD-10-CM

## 2013-08-04 MED ORDER — LEVOTHYROXINE SODIUM 112 MCG PO TABS: ORAL_TABLET | ORAL | Status: DC

## 2013-08-04 MED ORDER — DULOXETINE HCL 30 MG PO CPEP: ORAL_CAPSULE | ORAL | Status: DC

## 2013-08-04 NOTE — Progress Notes (Signed)
Subjective:    Patient ID: Jaclyn [name], female    DOB: 22-Jan-1936, 78 y.o.   MRN: [id number]  HPI  78 y/o WF here  To establish. She previously saw [name]. Last OV in 04/2013 ( note reviewed in deatil)... she has multiple medical problems as noted below- followed for HBP, PAF, Carotid disease, Hyperchol, DM, Hypothy, FM, Vit D defic, anxiety/ depression... she is also followed by [name] for Cardiology, and DrWeissman for GI...   She saw [name] 05/2013 hx normal cath in 2008, denies CP/ palpit/ SOB/ cerebral ischemic symptoms; no changes made.  Note reviewed in detail. [name] recommended changing to cymbalta for better fibromyalgia control.   At [name] last OV: HBP> Controlled on Accupril20, continue same + diet & no sodium...  CP/ FM> She has had atypCP as part of her severe FM problem; Recently stable w/o pain exac & doing satis on Daypro/ Tylenol/ etc...  Hx AFib> Holding NSR, remains regular w/o CP, palpit, racing, etc... She knows to avoid caffeine etc...  Cerebrovasc dis> Stable on ASA without cerebral ischemic symptoms; Dopplers followed at the [facility] lab & showed stable bilat ICA stenosis, f/u 60yr...  Hyperlipid> Nice response to Zetia 10mg  but needs to take it every day, get on low fat diet, and lose wt!!!  DM>  Lab Results  Component Value Date   HGBA1C 8.0* 05/04/2013   She is not taking meds regularly making control impossible; we discussed this & asked to incr Lantus30, take Glucovance-2Bid & ch Januvia (pt refuses this med) to Tradjenta5mg /d...  Increased LFTs> Routine labs w/ elev SGOT/ SGPT- we will check Abd sonar- rec diet exercise, wt reduction...  Vit D defic> She really needs to take the supplement, and asked to incr to 2000 u daily...  Anxiety> She likes the Lexapro & gets $$ help w/ this & wants to continue...   Today she reports:   Fibromyalgia severe.. She states this has been bad this year.  She has been having headaches and pain in base of  right neck. She was seen at ER in 02/2013.  She has improved since then, no further head pain, no further staggering. She has been on lexapro for mood and fibromyalgia. She occ uses robaxin  Which helps. She feels depressed and anxious, not well controlleld on lexapro. No motivation.  She is due for A1C check.  She has been cold and tired ongoing for several years.. Her last TSH was slightly high and has been so in last  Year.       Review of Systems  All other systems reviewed and are negative.      Objective:   Physical Exam  Constitutional: Vital signs are normal. She appears well-developed and well-nourished. She is cooperative.  Non-toxic appearance. She does not appear ill. No distress.  HENT:  Head: Normocephalic.  Right Ear: Hearing, tympanic membrane, external ear and ear canal normal. Tympanic membrane is not erythematous, not retracted and not bulging.  Left Ear: Hearing, tympanic membrane, external ear and ear canal normal. Tympanic membrane is not erythematous, not retracted and not bulging.  Nose: No mucosal edema or rhinorrhea. Right sinus exhibits no maxillary sinus tenderness and no frontal sinus tenderness. Left sinus exhibits no maxillary sinus tenderness and no frontal sinus tenderness.  Mouth/Throat: Uvula is midline, oropharynx is clear and moist and mucous membranes are normal.  Eyes: Conjunctivae, EOM and lids are normal. Pupils are equal, round, and reactive to light. Lids are everted  and swept, no foreign bodies found.  Neck: Trachea normal and normal range of motion. Neck supple. Carotid bruit is not present. No mass and no thyromegaly present.  Cardiovascular: Normal rate, regular rhythm, S1 normal, S2 normal, normal heart sounds, intact distal pulses and normal pulses.  Exam reveals no gallop and no friction rub.   No murmur heard. Pulmonary/Chest: Effort normal and breath sounds normal. Not tachypneic. No respiratory distress. She has no decreased breath  sounds. She has no wheezes. She has no rhonchi. She has no rales.  Abdominal: Soft. Normal appearance and bowel sounds are normal. There is no tenderness.  Neurological: She is alert.  Multiple fibromyalgia trigger points  Skin: Skin is warm, dry and intact. No rash noted.  Psychiatric: Her speech is normal and behavior is normal. Judgment and thought content normal. Her mood appears not anxious. Cognition and memory are normal. She does not exhibit a depressed mood.          Assessment & Plan:

## 2013-08-04 NOTE — Patient Instructions (Addendum)
Increase levothyroxine to  112 mcg daily. Stop lexapro. Start cymbalta 30 mg daily x 1 week then increase to 60 mg daily. Follow up in 4 weeks 30 min OV to discuss DM, chol., fibromyalgia and depression  with fasting labs prior.

## 2013-08-04 NOTE — Progress Notes (Signed)
Pre visit review using our clinic review tool, if applicable. No additional management support is needed unless otherwise documented below in the visit note. 

## 2013-08-15 ENCOUNTER — Telehealth: Payer: Self-pay | Admitting: *Deleted

## 2013-08-15 NOTE — Telephone Encounter (Signed)
Patient left a voicemail stating that she is to increase her Cymbalta to two pills a day. Patient wants to know if she should take them together or different times? Please advise.

## 2013-08-16 NOTE — Assessment & Plan Note (Signed)
Felt in past due to poorly controlled fibromyalgia.

## 2013-08-16 NOTE — Assessment & Plan Note (Signed)
BP Readings from Last 3 Encounters:  08/04/13 138/60  05/27/13 120/64  05/04/13 120/72   Well controlled. Continue current medication.

## 2013-08-16 NOTE — Assessment & Plan Note (Signed)
Lab Results  Component Value Date   TSH 5.97* 05/04/2013   Poor control last check. Pt symptomatic.  Increase levothyroxine to  112 mcg daily.

## 2013-08-16 NOTE — Assessment & Plan Note (Signed)
Due for re-eval. 

## 2013-08-16 NOTE — Telephone Encounter (Signed)
Can take them together. If tolerating she can change to 60 mg tablet.

## 2013-08-16 NOTE — Telephone Encounter (Signed)
Left message for Jaclyn [name] to return my call.

## 2013-08-16 NOTE — Assessment & Plan Note (Signed)
Poor control. Stop lexapro. Start cymbalta 30 mg daily x 1 week then increase to 60 mg daily.

## 2013-08-16 NOTE — Telephone Encounter (Signed)
Jaclyn [name] notified to take the two pills of Cymbalta together.  If tolerates the 60 mg will send in new prescription for new dose.   Patient is scheduled for follow up 09/01/2013 @ 3:15pm.

## 2013-08-25 ENCOUNTER — Other Ambulatory Visit (INDEPENDENT_AMBULATORY_CARE_PROVIDER_SITE_OTHER): Payer: Medicare Other

## 2013-08-25 DIAGNOSIS — E039 Hypothyroidism, unspecified: Secondary | ICD-10-CM

## 2013-08-25 DIAGNOSIS — E785 Hyperlipidemia, unspecified: Secondary | ICD-10-CM

## 2013-08-25 DIAGNOSIS — E119 Type 2 diabetes mellitus without complications: Secondary | ICD-10-CM

## 2013-08-25 LAB — COMPREHENSIVE METABOLIC PANEL
ALK PHOS: 72 U/L (ref 39–117)
ALT: 47 U/L — AB (ref 0–35)
AST: 46 U/L — AB (ref 0–37)
Albumin: 4.1 g/dL (ref 3.5–5.2)
BUN: 10 mg/dL (ref 6–23)
CALCIUM: 9.8 mg/dL (ref 8.4–10.5)
CO2: 25 mEq/L (ref 19–32)
CREATININE: 0.8 mg/dL (ref 0.4–1.2)
Chloride: 99 mEq/L (ref 96–112)
GFR: 74.11 mL/min (ref >60.00)
Glucose, Bld: 180 mg/dL — ABNORMAL HIGH (ref 70–99)
Potassium: 4 mEq/L (ref 3.5–5.1)
Sodium: 136 mEq/L (ref 135–145)
Total Bilirubin: 0.5 mg/dL (ref 0.2–1.2)
Total Protein: 7.6 g/dL (ref 6.0–8.3)

## 2013-08-25 LAB — LIPID PANEL
CHOLESTEROL: 185 mg/dL (ref 0–200)
HDL: 35.2 mg/dL — ABNORMAL LOW (ref >39.00)
LDL CALC: 93 mg/dL (ref 0–99)
NonHDL: 149.8
TRIGLYCERIDES: 283 mg/dL — AB (ref 0.0–149.0)
Total CHOL/HDL Ratio: 5
VLDL: 56.6 mg/dL — AB (ref 0.0–40.0)

## 2013-08-25 LAB — T3, FREE: T3 FREE: 2.4 pg/mL (ref 2.3–4.2)

## 2013-08-25 LAB — T4, FREE: FREE T4: 1.1 ng/dL (ref 0.60–1.60)

## 2013-08-25 LAB — HEMOGLOBIN A1C: Hgb A1c MFr Bld: 8.4 % — ABNORMAL HIGH (ref 4.6–6.5)

## 2013-08-25 LAB — TSH: TSH: 1.2 u[IU]/mL (ref 0.35–4.50)

## 2013-08-30 ENCOUNTER — Other Ambulatory Visit: Payer: Self-pay | Admitting: Family Medicine

## 2013-09-01 ENCOUNTER — Ambulatory Visit: Payer: Medicare Other | Admitting: Family Medicine

## 2013-09-19 ENCOUNTER — Other Ambulatory Visit: Payer: Self-pay | Admitting: Adult Health

## 2013-09-23 ENCOUNTER — Ambulatory Visit: Payer: Medicare Other | Admitting: Family Medicine

## 2013-09-26 ENCOUNTER — Other Ambulatory Visit: Payer: Self-pay | Admitting: Family Medicine

## 2013-09-30 ENCOUNTER — Encounter: Payer: Self-pay | Admitting: Pulmonary Disease

## 2013-10-04 ENCOUNTER — Ambulatory Visit (INDEPENDENT_AMBULATORY_CARE_PROVIDER_SITE_OTHER): Payer: Medicare Other | Admitting: Family Medicine

## 2013-10-04 ENCOUNTER — Encounter: Payer: Self-pay | Admitting: Family Medicine

## 2013-10-04 VITALS — BP 128/66 | HR 74 | Temp 98.1°F | Wt 156.0 lb

## 2013-10-04 DIAGNOSIS — E039 Hypothyroidism, unspecified: Secondary | ICD-10-CM

## 2013-10-04 DIAGNOSIS — E119 Type 2 diabetes mellitus without complications: Secondary | ICD-10-CM

## 2013-10-04 DIAGNOSIS — E785 Hyperlipidemia, unspecified: Secondary | ICD-10-CM

## 2013-10-04 DIAGNOSIS — I10 Essential (primary) hypertension: Secondary | ICD-10-CM

## 2013-10-04 DIAGNOSIS — IMO0001 Reserved for inherently not codable concepts without codable children: Secondary | ICD-10-CM

## 2013-10-04 LAB — HM DIABETES FOOT EXAM

## 2013-10-04 NOTE — Patient Instructions (Addendum)
Increase lantus to 45 Units daily. Continue glucovance. Stay off the tadjenta. Follow blood sugars at home Goal fasting < 120, goal 2 hours after meals < 180. Record measurements.  Start regular exercise... walking 3 times a week. Start leg strengthening. Continue current dose of thyroid medicaiton.

## 2013-10-04 NOTE — Progress Notes (Signed)
Subjective:    Patient ID: Jaclyn [name], female    DOB: 1935/08/28, 78 y.o.   MRN: [id number]  HPI  77 year old female presents for 2 month follow up on fibromyalgia, DM, chol and thyroid.   At last OV cymbalta was started for fibromyalgia. Thyroid medication was increased to 112 mcg.  TSH nml on recheck.  She reports that she has felt much better on the cymbalta. Her pain is less and depression is improved. She decreased back to  30 mg daily as she tolerated that better.  Diabetes: Poor control on glucovance max and lantus. Cannot afford tradjenta ( has been off this in last month) Lab Results  Component Value Date   HGBA1C 8.4* 08/25/2013  Using medications without difficulties:. See above Hypoglycemic episodes: None Hyperglycemic episodes:None Feet problems:None Blood Sugars averaging: FBS 150-160  2 hours after meals not checking eye exam within last year: None   Hypertension:   Well controlled. BP Readings from Last 3 Encounters:  10/04/13 128/66  08/04/13 138/60  05/27/13 120/64  Using medication without problems or lightheadedness: None Chest pain with exertion:NOne Edema:None Short of breath:None Average home BPs:not checking. Other issues:  Elevated Cholesterol: at goal on no med. Lab Results  Component Value Date   CHOL 185 08/25/2013   HDL 35.20* 08/25/2013   LDLCALC 93 08/25/2013   LDLDIRECT 141.5 10/08/2012   TRIG 283.0* 08/25/2013   CHOLHDL 5 08/25/2013   Diet compliance:Moderate Exercise:no exercise. Other complaints:     Review of Systems  Constitutional: Positive for fatigue. Negative for fever.  HENT: Negative for ear pain.   Eyes: Negative for pain.  Respiratory: Negative for shortness of breath.   Cardiovascular: Negative for chest pain.  Gastrointestinal: Negative for abdominal pain.  Musculoskeletal:       B knees wobble  Skin:       intermittent burning in skin all over       Objective:   Physical Exam  Constitutional: Vital signs  are normal. She appears well-developed and well-nourished. She is cooperative.  Non-toxic appearance. She does not appear ill. No distress.  HENT:  Head: Normocephalic.  Right Ear: Hearing, tympanic membrane, external ear and ear canal normal. Tympanic membrane is not erythematous, not retracted and not bulging.  Left Ear: Hearing, tympanic membrane, external ear and ear canal normal. Tympanic membrane is not erythematous, not retracted and not bulging.  Nose: No mucosal edema or rhinorrhea. Right sinus exhibits no maxillary sinus tenderness and no frontal sinus tenderness. Left sinus exhibits no maxillary sinus tenderness and no frontal sinus tenderness.  Mouth/Throat: Uvula is midline, oropharynx is clear and moist and mucous membranes are normal.  Eyes: Conjunctivae, EOM and lids are normal. Pupils are equal, round, and reactive to light. Lids are everted and swept, no foreign bodies found.  Neck: Trachea normal and normal range of motion. Neck supple. Carotid bruit is not present. No mass and no thyromegaly present.  Cardiovascular: Normal rate, regular rhythm, S1 normal, S2 normal, normal heart sounds, intact distal pulses and normal pulses.  Exam reveals no gallop and no friction rub.   No murmur heard. Pulmonary/Chest: Effort normal and breath sounds normal. Not tachypneic. No respiratory distress. She has no decreased breath sounds. She has no wheezes. She has no rhonchi. She has no rales.  Abdominal: Soft. Normal appearance and bowel sounds are normal. There is no tenderness.  Neurological: She is alert.  Multiple fibromyalgia trigger points  Skin: Skin is warm, dry and intact. No  rash noted.  Psychiatric: Her speech is normal and behavior is normal. Judgment and thought content normal. Her mood appears not anxious. Cognition and memory are normal. She does not exhibit a depressed mood.          Assessment & Plan:

## 2013-10-04 NOTE — Progress Notes (Signed)
Pre visit review using our clinic review tool, if applicable. No additional management support is needed unless otherwise documented below in the visit note. 

## 2013-10-07 ENCOUNTER — Ambulatory Visit: Payer: Medicare Other | Admitting: Family Medicine

## 2013-11-01 NOTE — Assessment & Plan Note (Signed)
At goal on no med. 

## 2013-11-01 NOTE — Assessment & Plan Note (Signed)
BP Readings from Last 3 Encounters:  10/04/13 128/66  08/04/13 138/60  05/27/13 120/64   Well controlled. Continue current medication.

## 2013-11-01 NOTE — Assessment & Plan Note (Signed)
Stable on new dose med.

## 2013-11-01 NOTE — Assessment & Plan Note (Signed)
IMproved control on cymbalta 30 mg daily. SE with 60 mg daily. Recommend heathy eating, sleep and increase exercise.

## 2013-11-01 NOTE — Assessment & Plan Note (Signed)
Increase lantus to 45 Units daily. Continue glucovance. Stay off the tadjenta. Follow blood sugars at home Goal fasting < 120, goal 2 hours after meals < 180. Record measurements.  Start regular exercise... walking 3 times a week. Start leg strengthening.

## 2013-11-23 ENCOUNTER — Other Ambulatory Visit: Payer: Self-pay | Admitting: Family Medicine

## 2013-12-03 ENCOUNTER — Other Ambulatory Visit: Payer: Self-pay | Admitting: Pulmonary Disease

## 2013-12-05 ENCOUNTER — Other Ambulatory Visit: Payer: Self-pay | Admitting: Family Medicine

## 2013-12-16 ENCOUNTER — Other Ambulatory Visit: Payer: Self-pay | Admitting: Pulmonary Disease

## 2013-12-17 ENCOUNTER — Other Ambulatory Visit: Payer: Self-pay | Admitting: Pulmonary Disease

## 2014-01-05 ENCOUNTER — Ambulatory Visit: Payer: Medicare Other | Admitting: Family Medicine

## 2014-01-10 LAB — HM DIABETES EYE EXAM

## 2014-01-18 ENCOUNTER — Ambulatory Visit (INDEPENDENT_AMBULATORY_CARE_PROVIDER_SITE_OTHER): Payer: Medicare Other | Admitting: Family Medicine

## 2014-01-18 VITALS — BP 138/72 | HR 89 | Temp 98.3°F | Ht 63.0 in | Wt 157.5 lb

## 2014-01-18 DIAGNOSIS — I10 Essential (primary) hypertension: Secondary | ICD-10-CM

## 2014-01-18 DIAGNOSIS — E1165 Type 2 diabetes mellitus with hyperglycemia: Secondary | ICD-10-CM

## 2014-01-18 DIAGNOSIS — M797 Fibromyalgia: Secondary | ICD-10-CM

## 2014-01-18 NOTE — Progress Notes (Signed)
78 year old female presetns for follow up. Fibromyalgia. She thinks she hallucinated when taking 60 mg at the same time.  Diabetes:  At last OV increased lantus to 45 units, continue glucovance and told to stay of tradjenta given SE.  She is due for repeat A1C today. Lab Results  Component Value Date   HGBA1C 8.4* 08/25/2013  Using medications without difficulties:. See above Hypoglycemic episodes: None Hyperglycemic episodes:Yes. Feet problems:None Blood Sugars averaging: FBS 114-179 2 hours after meals  She did have 483 after eating a lot of watermelon. eye exam within last year: None  Moderate diet control. No exercise.   Hypertension:  Well controlled on quinapril, metoprolol. She wants to stop metoprolol because she fells metoprolol makes her think about dying.  She does have paroxsysmal afib. She has Dr., Johnsie Cancel as CArdiology. BP Readings from Last 3 Encounters:  01/18/14 138/72  10/04/13 128/66  08/04/13 138/60  Using medication without problems or lightheadedness: None Chest pain with exertion:NOne Edema:None Short of breath:None Average home BPs:not checking. Other issues:    Review of Systems  Constitutional: Positive for fatigue. Negative for fever.  HENT: Negative for ear pain.  Eyes: Negative for pain.  Respiratory: Negative for shortness of breath.  Cardiovascular: Negative for chest pain.  Gastrointestinal: Negative for abdominal pain.     Objective:   Physical Exam  Constitutional: Vital signs are normal. She appears well-developed and well-nourished. She is cooperative. Non-toxic appearance. She does not appear ill. No distress.  HENT:  Head: Normocephalic.  Right Ear: Hearing, tympanic membrane, external ear and ear canal normal. Tympanic membrane is not erythematous, not retracted and not bulging.  Left Ear: Hearing, tympanic membrane, external ear and ear canal normal. Tympanic membrane is not erythematous, not retracted and not bulging.   Nose: No mucosal edema or rhinorrhea. Right sinus exhibits no maxillary sinus tenderness and no frontal sinus tenderness. Left sinus exhibits no maxillary sinus tenderness and no frontal sinus tenderness.  Mouth/Throat: Uvula is midline, oropharynx is clear and moist and mucous membranes are normal.  Eyes: Conjunctivae, EOM and lids are normal. Pupils are equal, round, and reactive to light. Lids are everted and swept, no foreign bodies found.  Neck: Trachea normal and normal range of motion. Neck supple. Carotid bruit is not present. No mass and no thyromegaly present.  Cardiovascular: Normal rate, regular rhythm, S1 normal, S2 normal, normal heart sounds, intact distal pulses and normal pulses. Exam reveals no gallop and no friction rub.  No murmur heard. Pulmonary/Chest: Effort normal and breath sounds normal. Not tachypneic. No respiratory distress. She has no decreased breath sounds. She has no wheezes. She has no rhonchi. She has no rales.  Abdominal: Soft. Normal appearance and bowel sounds are normal. There is no tenderness.  Neurological: She is alert.  Multiple fibromyalgia trigger points  Skin: Skin is warm, dry and intact. No rash noted.  Psychiatric: Her speech is normal and behavior is normal. Judgment and thought content normal. Her mood appears not anxious. Cognition and memory are normal. She does not exhibit a depressed mood.

## 2014-01-18 NOTE — Progress Notes (Signed)
Pre visit review using our clinic review tool, if applicable. No additional management support is needed unless otherwise documented below in the visit note. 

## 2014-01-18 NOTE — Patient Instructions (Addendum)
Start regular exercise.  Stop at lab on way out today.  Increase insulin to 55 Units daily.  Decrease sweets in diet. Can do trial of cymbalta 30 mg twice daily.  Call [name] to make appt to discuss changing metoprolol to avoid SE but still control afib. Continue for now.

## 2014-01-22 ENCOUNTER — Other Ambulatory Visit: Payer: Self-pay | Admitting: Family Medicine

## 2014-01-22 ENCOUNTER — Other Ambulatory Visit: Payer: Self-pay | Admitting: Pulmonary Disease

## 2014-02-27 NOTE — Assessment & Plan Note (Signed)
Call [name] to make appt to discuss changing metoprolol to avoid SE but still control afib. Continue for now.

## 2014-02-27 NOTE — Assessment & Plan Note (Signed)
Can do trial of cymbalta 30 mg twice daily.

## 2014-02-27 NOTE — Assessment & Plan Note (Signed)
Start regular exercise.  Stop at lab on way out today.  Increase insulin to 55 Units daily.  Decrease sweets in diet.

## 2014-03-16 ENCOUNTER — Other Ambulatory Visit: Payer: Self-pay | Admitting: Family Medicine

## 2014-03-27 ENCOUNTER — Other Ambulatory Visit: Payer: Self-pay | Admitting: Family Medicine

## 2014-03-28 ENCOUNTER — Telehealth: Payer: Self-pay

## 2014-03-28 NOTE — Telephone Encounter (Signed)
Pt left v/m; pt received letter from [facility] stating was not going to cover glyburide metformin any longer; pt has been taking 7-8 years. Letter said could cause safety concerns and serious side effects and are no longer recommended. Letter advised could try substitute  glipizide plus metformin Pt wants to know what to do.pt request cb.[facility].

## 2014-03-29 MED ORDER — METFORMIN HCL ER (MOD) 1000 MG PO TB24: 1000.0000 mg | ORAL_TABLET | Freq: Every day | ORAL | Status: DC

## 2014-03-29 MED ORDER — GLIPIZIDE 10 MG PO TABS: 10.0000 mg | ORAL_TABLET | Freq: Every day | ORAL | Status: DC

## 2014-03-29 NOTE — Telephone Encounter (Signed)
Ms. Koenen notified prescriptions for both glyburide and metformin have been sent to her pharmacy since her insurance will no longer cover the combination drug.  Advised they are long acting medications so she will only need to take one tablet of each once daily.  Ms. Smullen has an appointment in March and states she will bring the letter her insurance company sent her so we can check into what their safety concerns are.

## 2014-03-29 NOTE — Telephone Encounter (Signed)
Please call insurance to find out what the issue with the med is, as long as it does not take long ( ie long wait on the phone)  Okay to change medication to two separate med. I will send in. Make sure pt aware that I sent in long acting meds so she only needs to take then both once a day at one tab each.

## 2014-04-11 ENCOUNTER — Other Ambulatory Visit: Payer: Self-pay | Admitting: Pulmonary Disease

## 2014-04-19 ENCOUNTER — Other Ambulatory Visit: Payer: Self-pay

## 2014-04-21 ENCOUNTER — Ambulatory Visit: Payer: Self-pay | Admitting: Family Medicine

## 2014-04-28 ENCOUNTER — Other Ambulatory Visit: Payer: Self-pay | Admitting: Family Medicine

## 2014-04-28 DIAGNOSIS — IMO0002 Reserved for concepts with insufficient information to code with codable children: Secondary | ICD-10-CM

## 2014-04-28 DIAGNOSIS — E1165 Type 2 diabetes mellitus with hyperglycemia: Secondary | ICD-10-CM

## 2014-04-29 ENCOUNTER — Other Ambulatory Visit: Payer: Self-pay | Admitting: Family Medicine

## 2014-05-04 ENCOUNTER — Other Ambulatory Visit: Payer: Self-pay

## 2014-05-08 ENCOUNTER — Other Ambulatory Visit (INDEPENDENT_AMBULATORY_CARE_PROVIDER_SITE_OTHER): Payer: Medicare Other

## 2014-05-08 DIAGNOSIS — E1165 Type 2 diabetes mellitus with hyperglycemia: Secondary | ICD-10-CM

## 2014-05-08 DIAGNOSIS — IMO0002 Reserved for concepts with insufficient information to code with codable children: Secondary | ICD-10-CM

## 2014-05-08 LAB — HEMOGLOBIN A1C: Hgb A1c MFr Bld: 8.4 % — ABNORMAL HIGH (ref 4.6–6.5)

## 2014-05-11 ENCOUNTER — Ambulatory Visit (INDEPENDENT_AMBULATORY_CARE_PROVIDER_SITE_OTHER): Payer: Medicare Other | Admitting: Family Medicine

## 2014-05-11 ENCOUNTER — Encounter: Payer: Self-pay | Admitting: Family Medicine

## 2014-05-11 VITALS — BP 130/72 | HR 92 | Temp 98.0°F | Ht 63.0 in | Wt 156.2 lb

## 2014-05-11 DIAGNOSIS — R251 Tremor, unspecified: Secondary | ICD-10-CM | POA: Diagnosis not present

## 2014-05-11 DIAGNOSIS — E1165 Type 2 diabetes mellitus with hyperglycemia: Secondary | ICD-10-CM

## 2014-05-11 DIAGNOSIS — R29898 Other symptoms and signs involving the musculoskeletal system: Secondary | ICD-10-CM | POA: Diagnosis not present

## 2014-05-11 DIAGNOSIS — I10 Essential (primary) hypertension: Secondary | ICD-10-CM

## 2014-05-11 LAB — HM DIABETES FOOT EXAM

## 2014-05-11 NOTE — Assessment & Plan Note (Signed)
Well controlled. Continue current medication.  

## 2014-05-11 NOTE — Progress Notes (Addendum)
79 year old female presents for 3 month follow up on  DM    Diabetes: Poor control on glucovance max and lantus 55 Units.  Good compliance with medication. Lab Results  Component Value Date   HGBA1C 8.4* 05/08/2014   Using medications without difficulties:. See above Hypoglycemic episodes: None Hyperglycemic episodes:occ after poor diet Feet problems:? fungus Blood Sugars averaging: FBS 150 2 hours after meals not checking eye exam within last year: done Diet: Moderate Refuses nutritionist.  Exercise:None   Hypertension:  Well controlled. BP Readings from Last 3 Encounters:  05/11/14 130/72  01/18/14 138/72  10/04/13 128/66   Using medication without problems or lightheadedness: None Chest pain with exertion:NOne Edema:None Short of breath:None Average home BPs:not checking. Other issues:    She continues to have very wobbly shaky legs, poor balanace.  Legs shake when she stands on her feet.  Worse at times but always present. Ongoing in last year.    Occ tremor in hands when putting on eyeliner. Has fibro that is much better on cymbalta. Wobbliness has not been better with improvement of pain. Has some back pain. No numbness, muscles feel weak. No family parkinson's    Review of Systems  Constitutional: Positive for fatigue. Negative for fever.  HENT: Negative for ear pain.  Eyes: Negative for pain.  Respiratory: Negative for shortness of breath.  Cardiovascular: Negative for chest pain.  Gastrointestinal: Negative for abdominal pain.  Musculoskeletal:    Skin:        Objective:   Physical Exam  Constitutional: Vital signs are normal. She appears well-developed and well-nourished. She is cooperative. Non-toxic appearance. She does not appear ill. No distress.  HENT:  Head: Normocephalic.  Right Ear: Hearing, tympanic membrane, external ear and ear canal normal. Tympanic membrane is not erythematous, not retracted and not bulging.  Left  Ear: Hearing, tympanic membrane, external ear and ear canal normal. Tympanic membrane is not erythematous, not retracted and not bulging.  Nose: No mucosal edema or rhinorrhea. Right sinus exhibits no maxillary sinus tenderness and no frontal sinus tenderness. Left sinus exhibits no maxillary sinus tenderness and no frontal sinus tenderness.  Mouth/Throat: Uvula is midline, oropharynx is clear and moist and mucous membranes are normal.  Eyes: Conjunctivae, EOM and lids are normal. Pupils are equal, round, and reactive to light. Lids are everted and swept, no foreign bodies found.  Neck: Trachea normal and normal range of motion. Neck supple. Carotid bruit is not present. No mass and no thyromegaly present.  Cardiovascular: Normal rate, regular rhythm, S1 normal, S2 normal, normal heart sounds, intact distal pulses and normal pulses. Exam reveals no gallop and no friction rub.  No murmur heard. Pulmonary/Chest: Effort normal and breath sounds normal. Not tachypneic. No respiratory distress. She has no decreased breath sounds. She has no wheezes. She has no rhonchi. She has no rales.  Abdominal: Soft. Normal appearance and bowel sounds are normal. There is no tenderness.  Neurologic: Gait: Abnormal, slowed, but swings arms, no cogwheeling A and O x 3, nml cerebellar  tests, nml cranial nerves, strength 5/5 in lower ext. No current tremor Multiple fibromyalgia trigger points  Skin: Skin is warm, dry and intact. No rash noted.  Psychiatric: Her speech is normal and behavior is normal. Judgment and thought content normal. Her mood appears not anxious. Cognition and memory are normal. Sh, e does not exhibit a depressed mood.   Diabetic foot exam: Normal inspection No skin breakdown No calluses  Normal DP pulses Normal sensation  to light touch and monofilament Nails normal

## 2014-05-11 NOTE — Assessment & Plan Note (Signed)
UNnclear cause

## 2014-05-11 NOTE — Patient Instructions (Addendum)
Start regular exercise.. Start slow and increase. Work on low [employer]. Increase Lantus to 65 Units daily. Call with fasting blood sugars in next 1-2 weeks on higher dose insulin... May need increase further.  Stop at front desk on way out for referral.

## 2014-05-11 NOTE — Assessment & Plan Note (Signed)
Poor control.  Increase lantus to 65 units, continue oral meds. Pt will call with CBGs in 1-2 week for further increase in lantus.  Counseled on low carb diet and working on increasing exercise.

## 2014-05-11 NOTE — Progress Notes (Signed)
Pre visit review using our clinic review tool, if applicable. No additional management support is needed unless otherwise documented below in the visit note. 

## 2014-05-11 NOTE — Assessment & Plan Note (Signed)
Not consistent with benign essential tremor.  Nml neuro exam today. No tremor/weakness on exam, pt say it occurs intermittantly. Will refer to neurology for recommendations.

## 2014-05-12 ENCOUNTER — Other Ambulatory Visit: Payer: Self-pay | Admitting: Family Medicine

## 2014-06-05 ENCOUNTER — Other Ambulatory Visit: Payer: Self-pay | Admitting: Family Medicine

## 2014-06-05 ENCOUNTER — Other Ambulatory Visit: Payer: Self-pay | Admitting: Cardiovascular Disease

## 2014-06-05 ENCOUNTER — Telehealth: Payer: Self-pay | Admitting: Family Medicine

## 2014-06-05 DIAGNOSIS — I6523 Occlusion and stenosis of bilateral carotid arteries: Secondary | ICD-10-CM

## 2014-06-05 NOTE — Telephone Encounter (Signed)
Pt states Dr. Diona Browner changed her Lantus Solostar to 65 units at bedtime.  Pt states this has never been relayed to the pharmacy, so they need [name] to send a new prescription for 65 units at bedtime.

## 2014-06-06 MED ORDER — INSULIN GLARGINE 100 UNIT/ML SOLOSTAR PEN: 65.0000 [IU] | PEN_INJECTOR | Freq: Every day | SUBCUTANEOUS | Status: DC

## 2014-06-06 NOTE — Telephone Encounter (Addendum)
Please send new rx as requested. Ms. Fraley notified prescription has been sent to her pharmacy.

## 2014-06-08 ENCOUNTER — Ambulatory Visit: Payer: Medicare Other | Admitting: Neurology

## 2014-06-29 NOTE — Progress Notes (Signed)
Patient ID: Jaclyn [name], female   DOB: 1935-04-15, 79 y.o.   MRN: [id number] Jaclyn [name] is seen today fo rf/U of HTN, elevated lipids previous atypical SSCP and right carotid bruit. She's had a normal cath in 2008. She has known RICA dx. I reviewed her duplex from today  and she has stable 60-79% RICA stenosis (403/47), and 4-25% LICA stenosis. She has had no recurrent SSCP, no TIA like symptoms. She has been compliant with her meds. She is intolerant to statins. She needs to have her cholesterol checked at Dr. Brendolyn [name]. Othewise she is doing well Some fibromyalgia and neuropatthy. No history of PVC or claudication.   Still with somewhat poorly controlled DM  A1c in 8 range  Poor diet  Lantus recently increased by Dr Jaclyn [name]   05/30/13    60-79% RICA stentosis Duplex today reviewed and no change   Lots of pain from fibromyalgia Thinks toprol is contributing to depression  HR and BP up today   ROS: Denies fever, malais, weight loss, blurry vision, decreased visual acuity, cough, sputum, SOB, hemoptysis, pleuritic pain, palpitaitons, heartburn, abdominal pain, melena, lower extremity edema, claudication, or rash.  All other systems reviewed and negative  General: Affect appropriate Healthy:  appears stated age 79: normal Neck supple with no adenopathy JVP normal right bruits no thyromegaly Lungs clear with no wheezing and good diaphragmatic motion Heart:  S1/S2 no murmur, no rub, gallop or click PMI normal Abdomen: benighn, BS positve, no tenderness, no AAA no bruit.  No HSM or HJR Distal pulses intact with no bruits No edema Neuro non-focal Skin warm and dry No muscular weakness   Current Outpatient Prescriptions  Medication Sig Dispense Refill  . B Complex Vitamins (VITAMIN-B COMPLEX PO) Take 1 tablet by mouth daily.      . cholecalciferol (VITAMIN D) 1000 UNITS tablet Take 2,000 Units by mouth daily.     . DULoxetine (CYMBALTA) 30 MG capsule TAKE 2 CAPSULES (60 MG TOTAL) BY MOUTH  DAILY. 60 capsule 5  . glipiZIDE (GLUCOTROL) 10 MG tablet Take 1 tablet (10 mg total) by mouth daily before breakfast. 90 tablet 3  . glucose blood test strip 1 each by Other route as needed. Use as instructed check blood sugars once daily     . Insulin Glargine (LANTUS SOLOSTAR) 100 UNIT/ML Solostar Pen Inject 65 Units into the skin at bedtime. 5 pen 5  . Insulin Pen Needle 29G X 12.7MM MISC Use as directed 100 each 5  . levothyroxine (SYNTHROID, LEVOTHROID) 112 MCG tablet TAKE 1 TABLET BY MOUTH ONCE A DAY 30 tablet 5  . metFORMIN (GLUMETZA) 1000 MG (MOD) 24 hr tablet Take 1 tablet (1,000 mg total) by mouth daily with breakfast. 90 tablet 3  . methocarbamol (ROBAXIN) 500 MG tablet Take 1 tablet (500 mg total) by mouth every 8 (eight) hours as needed for muscle spasms. 90 tablet 1  . metoprolol succinate (TOPROL-XL) 25 MG 24 hr tablet TAKE 1 TABLET (25 MG TOTAL) BY MOUTH DAILY. 30 tablet 5  . oxaprozin (DAYPRO) 600 MG tablet 1 once daily as needed for joint pain take with food 30 tablet 6  . quinapril (ACCUPRIL) 20 MG tablet TAKE 1 TABLET BY MOUTH EVERY DAY 30 tablet 5  . ranitidine (ZANTAC) 150 MG tablet Take 1 tablet (150 mg total) by mouth 2 (two) times daily. 60 tablet 2  . vitamin B-12 (CYANOCOBALAMIN) 100 MCG tablet Take 50 mcg by mouth daily.       No current facility-administered  medications for this visit.    Allergies  Naproxen sodium and Sulfonamide derivatives  Electrocardiogram:   05/27/13  SR LAFB  Poor R wave progression  06/30/14  ST rate 109  Poor R wave progression increased HR since previous   Assessment and Plan HTN:  Not clear why BP/HR up  Change to coreg 6.25 bid and increase accupril to 40 mg  F/u next available Carotid:  Stable 60-79% RICA stenosis.  F/U carotid duplex in 6 months  DM:  Discussed low carb diet.  Target hemoglobin A1c is 6.5 or less.  Continue current medications. MS:  Seems to have some manic tendencyies with depression f/u with primary Currently on  cymbalta   [name]

## 2014-06-30 ENCOUNTER — Encounter: Payer: Self-pay | Admitting: Cardiovascular Disease

## 2014-06-30 ENCOUNTER — Ambulatory Visit: Payer: Medicare Other | Admitting: Cardiovascular Disease

## 2014-06-30 ENCOUNTER — Ambulatory Visit (INDEPENDENT_AMBULATORY_CARE_PROVIDER_SITE_OTHER): Payer: Medicare Other | Admitting: Cardiovascular Disease

## 2014-06-30 ENCOUNTER — Ambulatory Visit (HOSPITAL_COMMUNITY): Payer: Medicare Other | Attending: Internal Medicine

## 2014-06-30 ENCOUNTER — Encounter (HOSPITAL_COMMUNITY): Payer: Medicare Other

## 2014-06-30 VITALS — BP 157/77 | HR 109 | Ht 62.5 in | Wt 158.1 lb

## 2014-06-30 DIAGNOSIS — I10 Essential (primary) hypertension: Secondary | ICD-10-CM | POA: Insufficient documentation

## 2014-06-30 DIAGNOSIS — E785 Hyperlipidemia, unspecified: Secondary | ICD-10-CM | POA: Diagnosis not present

## 2014-06-30 DIAGNOSIS — R42 Dizziness and giddiness: Secondary | ICD-10-CM | POA: Diagnosis not present

## 2014-06-30 DIAGNOSIS — I2510 Atherosclerotic heart disease of native coronary artery without angina pectoris: Secondary | ICD-10-CM | POA: Insufficient documentation

## 2014-06-30 DIAGNOSIS — I159 Secondary hypertension, unspecified: Secondary | ICD-10-CM | POA: Diagnosis not present

## 2014-06-30 DIAGNOSIS — I6523 Occlusion and stenosis of bilateral carotid arteries: Secondary | ICD-10-CM | POA: Diagnosis present

## 2014-06-30 DIAGNOSIS — E119 Type 2 diabetes mellitus without complications: Secondary | ICD-10-CM | POA: Insufficient documentation

## 2014-06-30 MED ORDER — CARVEDILOL 6.25 MG PO TABS: 6.2500 mg | ORAL_TABLET | Freq: Two times a day (BID) | ORAL | Status: DC

## 2014-06-30 MED ORDER — QUINAPRIL HCL 40 MG PO TABS: 40.0000 mg | ORAL_TABLET | Freq: Every day | ORAL | Status: DC

## 2014-06-30 NOTE — Patient Instructions (Signed)
Medication Instructions:  Your physician has recommended you make the following change in your medication: STOP   METOPROLOL START CARVEDILOL  6.25 MG  TWICE  DAILY INCREASE  ACCUPRIL  TO  40   MG   Labwork: NONE  Testing/Procedures: NONE  Follow-Up: Your physician recommends that you schedule a follow-up appointment in: NEXT  AVAILABLE WITH DR Johnsie Cancel  Any Other Special Instructions Will Be Listed Below (If Applicable).

## 2014-07-10 ENCOUNTER — Telehealth: Payer: Self-pay

## 2014-07-10 NOTE — Telephone Encounter (Signed)
Jaclyn [name] with [facility] left v/m; 03/2014 Dr Diona Browner prescribed metformin ER 1000 mg; Jaclyn [name] was checking refill today for pt and was originally filled for metformin 1000 mg taking one tab by mouth at breakfast; Jaclyn [name] called to report their error and wants to know if should continue with the metformin 1000 mg immediate release pt has been getting or what to do. Anna request cb. On med list is the Metformin 1000 mg 24 hr tab taking one tab by mouth at breakfast.

## 2014-07-11 ENCOUNTER — Ambulatory Visit: Payer: Medicare Other | Admitting: Neurology

## 2014-07-11 MED ORDER — METFORMIN HCL ER (MOD) 500 MG PO TB24: 1000.0000 mg | ORAL_TABLET | Freq: Every day | ORAL | Status: DC

## 2014-07-11 NOTE — Telephone Encounter (Signed)
Agreed -

## 2014-07-11 NOTE — Telephone Encounter (Signed)
Spoke with Vicente Males at [facility].  She states since she called [name], her son came in to pick up her prescriptions and the Metformin ER 1000 mg is not covered by her insurance and would cost them $200 out of pocket.  She did state we could do 500 mg ER with directions of take 2 tablets by mouth at breakfast and that would be no cost to the patient.  Verbal okay given to change to the 500 mg ER.  New prescription sent in electronically to [facility].

## 2014-07-11 NOTE — Telephone Encounter (Signed)
She needs to be on the long acting 1000 mg .This is likely why her DM is not under control.

## 2014-09-16 NOTE — Progress Notes (Signed)
No show

## 2014-09-18 ENCOUNTER — Encounter: Payer: Medicare Other | Admitting: Cardiovascular Disease

## 2014-09-21 ENCOUNTER — Encounter: Payer: Self-pay | Admitting: Cardiovascular Disease

## 2014-09-30 ENCOUNTER — Other Ambulatory Visit: Payer: Self-pay | Admitting: Family Medicine

## 2014-10-01 NOTE — Telephone Encounter (Signed)
Last office visit 05/11/14.  Was told to follow up in June for [facility].  No appointments scheduled.  Last refilled 03/27/2014 for #60 with 5 refills.  Ok to refill?

## 2014-10-03 NOTE — Telephone Encounter (Signed)
Will refill x 3 months. Call pt to make appt for medicare wellness before further refills.

## 2014-11-01 ENCOUNTER — Encounter (HOSPITAL_COMMUNITY): Payer: Self-pay | Admitting: *Deleted

## 2014-11-01 ENCOUNTER — Emergency Department (HOSPITAL_COMMUNITY): Payer: Medicare Other

## 2014-11-01 ENCOUNTER — Emergency Department (HOSPITAL_COMMUNITY)
Admission: EM | Admit: 2014-11-01 | Discharge: 2014-11-01 | Disposition: A | Discharge status: Home / Self Care | Payer: Medicare Other | Attending: Emergency Medicine | Admitting: Emergency Medicine

## 2014-11-01 DIAGNOSIS — R531 Weakness: Secondary | ICD-10-CM | POA: Diagnosis not present

## 2014-11-01 DIAGNOSIS — M1990 Unspecified osteoarthritis, unspecified site: Secondary | ICD-10-CM | POA: Insufficient documentation

## 2014-11-01 DIAGNOSIS — E119 Type 2 diabetes mellitus without complications: Secondary | ICD-10-CM | POA: Insufficient documentation

## 2014-11-01 DIAGNOSIS — N390 Urinary tract infection, site not specified: Secondary | ICD-10-CM

## 2014-11-01 DIAGNOSIS — R4182 Altered mental status, unspecified: Secondary | ICD-10-CM | POA: Diagnosis not present

## 2014-11-01 DIAGNOSIS — E785 Hyperlipidemia, unspecified: Secondary | ICD-10-CM | POA: Diagnosis not present

## 2014-11-01 DIAGNOSIS — Z79899 Other long term (current) drug therapy: Secondary | ICD-10-CM | POA: Diagnosis not present

## 2014-11-01 DIAGNOSIS — Z8669 Personal history of other diseases of the nervous system and sense organs: Secondary | ICD-10-CM | POA: Diagnosis not present

## 2014-11-01 DIAGNOSIS — Z794 Long term (current) use of insulin: Secondary | ICD-10-CM | POA: Diagnosis not present

## 2014-11-01 DIAGNOSIS — R4701 Aphasia: Secondary | ICD-10-CM | POA: Diagnosis not present

## 2014-11-01 DIAGNOSIS — I4891 Unspecified atrial fibrillation: Secondary | ICD-10-CM | POA: Insufficient documentation

## 2014-11-01 DIAGNOSIS — R42 Dizziness and giddiness: Secondary | ICD-10-CM | POA: Diagnosis present

## 2014-11-01 DIAGNOSIS — E559 Vitamin D deficiency, unspecified: Secondary | ICD-10-CM | POA: Diagnosis not present

## 2014-11-01 DIAGNOSIS — Z86010 Personal history of colonic polyps: Secondary | ICD-10-CM | POA: Diagnosis not present

## 2014-11-01 DIAGNOSIS — E039 Hypothyroidism, unspecified: Secondary | ICD-10-CM | POA: Insufficient documentation

## 2014-11-01 DIAGNOSIS — Z9889 Other specified postprocedural states: Secondary | ICD-10-CM | POA: Diagnosis not present

## 2014-11-01 DIAGNOSIS — I10 Essential (primary) hypertension: Secondary | ICD-10-CM | POA: Insufficient documentation

## 2014-11-01 DIAGNOSIS — Z8719 Personal history of other diseases of the digestive system: Secondary | ICD-10-CM | POA: Diagnosis not present

## 2014-11-01 DIAGNOSIS — M797 Fibromyalgia: Secondary | ICD-10-CM | POA: Diagnosis not present

## 2014-11-01 LAB — COMPREHENSIVE METABOLIC PANEL
ALBUMIN: 3.6 g/dL (ref 3.5–5.0)
ALK PHOS: 59 U/L (ref 38–126)
ALT: 24 U/L (ref 14–54)
ANION GAP: 10 (ref 5–15)
AST: 27 U/L (ref 15–41)
BILIRUBIN TOTAL: 0.8 mg/dL (ref 0.3–1.2)
BUN: 10 mg/dL (ref 6–20)
CALCIUM: 9.5 mg/dL (ref 8.9–10.3)
CO2: 26 mmol/L (ref 22–32)
Chloride: 100 mmol/L — ABNORMAL LOW (ref 101–111)
Creatinine, Ser: 0.82 mg/dL (ref 0.44–1.00)
GFR calc non Af Amer: >60 mL/min (ref >60)
GLUCOSE: 259 mg/dL — AB (ref 65–99)
Potassium: 4.1 mmol/L (ref 3.5–5.1)
Sodium: 136 mmol/L (ref 135–145)
TOTAL PROTEIN: 6.8 g/dL (ref 6.5–8.1)

## 2014-11-01 LAB — URINALYSIS, ROUTINE W REFLEX MICROSCOPIC
BILIRUBIN URINE: NEGATIVE
GLUCOSE, UA: NEGATIVE mg/dL
HGB URINE DIPSTICK: NEGATIVE
KETONES UR: NEGATIVE mg/dL
NITRITE: POSITIVE — AB
PH: 5.5 (ref 5.0–8.0)
Protein, ur: NEGATIVE mg/dL
Specific Gravity, Urine: 1.018 (ref 1.005–1.030)
Urobilinogen, UA: 0.2 mg/dL (ref 0.0–1.0)

## 2014-11-01 LAB — URINE MICROSCOPIC-ADD ON

## 2014-11-01 LAB — CBC
HCT: 37.2 % (ref 36.0–46.0)
HEMOGLOBIN: 12.7 g/dL (ref 12.0–15.0)
MCH: 30.7 pg (ref 26.0–34.0)
MCHC: 34.1 g/dL (ref 30.0–36.0)
MCV: 89.9 fL (ref 78.0–100.0)
PLATELETS: 220 10*3/uL (ref 150–400)
RBC: 4.14 MIL/uL (ref 3.87–5.11)
RDW: 13.7 % (ref 11.5–15.5)
WBC: 10.1 10*3/uL (ref 4.0–10.5)

## 2014-11-01 LAB — CBG MONITORING, ED: GLUCOSE-CAPILLARY: 228 mg/dL — AB (ref 65–99)

## 2014-11-01 LAB — ETHANOL

## 2014-11-01 MED ORDER — CEPHALEXIN 500 MG PO CAPS: 500.0000 mg | ORAL_CAPSULE | Freq: Three times a day (TID) | ORAL | Status: DC

## 2014-11-01 MED ORDER — CEPHALEXIN 250 MG PO CAPS
500.0000 mg | ORAL_CAPSULE | Freq: Once | ORAL | Status: AC
  Administered 2014-11-01: 500 mg via ORAL
  Filled 2014-11-01: qty 2

## 2014-11-01 NOTE — ED Notes (Signed)
Patient transported to CT 

## 2014-11-01 NOTE — ED Notes (Signed)
Pt arrives from home via [facility]. Pt was LSN last night. Pt states she is up all night and sleeps all day. Pt is short by 17 pills on her cymbalta. Pt called her son and stated she felt like her tongue was thick. Family states the pt seems more giddy than normal. Pt is a&o x4. Pt states she is "feeling off still and her head feels better, says it was feeling heavy". Pt states it felt like her sugar went down.

## 2014-11-01 NOTE — Discharge Instructions (Signed)
Take keflex as prescribed until all gone. Make sure to drink plenty of fluids to stay hydrated. Follow up with primary care doctor.   Urinary Tract Infection Urinary tract infections (UTIs) can develop anywhere along your urinary tract. Your urinary tract is your body's drainage system for removing wastes and extra water. Your urinary tract includes two kidneys, two ureters, a bladder, and a urethra. Your kidneys are a pair of bean-shaped organs. Each kidney is about the size of your fist. They are located below your ribs, one on each side of your spine. CAUSES Infections are caused by microbes, which are microscopic organisms, including fungi, viruses, and bacteria. These organisms are so small that they can only be seen through a microscope. Bacteria are the microbes that most commonly cause UTIs. SYMPTOMS  Symptoms of UTIs may vary by age and gender of the patient and by the location of the infection. Symptoms in young women typically include a frequent and intense urge to urinate and a painful, burning feeling in the bladder or urethra during urination. Older women and men are more likely to be tired, shaky, and weak and have muscle aches and abdominal pain. A fever may mean the infection is in your kidneys. Other symptoms of a kidney infection include pain in your back or sides below the ribs, nausea, and vomiting. DIAGNOSIS To diagnose a UTI, your caregiver will ask you about your symptoms. Your caregiver also will ask to provide a urine sample. The urine sample will be tested for bacteria and white blood cells. White blood cells are made by your body to help fight infection. TREATMENT  Typically, UTIs can be treated with medication. Because most UTIs are caused by a bacterial infection, they usually can be treated with the use of antibiotics. The choice of antibiotic and length of treatment depend on your symptoms and the type of bacteria causing your infection. HOME CARE INSTRUCTIONS  If you  were prescribed antibiotics, take them exactly as your caregiver instructs you. Finish the medication even if you feel better after you have only taken some of the medication.  Drink enough water and fluids to keep your urine clear or pale yellow.  Avoid caffeine, tea, and carbonated beverages. They tend to irritate your bladder.  Empty your bladder often. Avoid holding urine for long periods of time.  Empty your bladder before and after sexual intercourse.  After a bowel movement, women should cleanse from front to back. Use each tissue only once. SEEK MEDICAL CARE IF:   You have back pain.  You develop a fever.  Your symptoms do not begin to resolve within 3 days. SEEK IMMEDIATE MEDICAL CARE IF:   You have severe back pain or lower abdominal pain.  You develop chills.  You have nausea or vomiting.  You have continued burning or discomfort with urination. MAKE SURE YOU:   Understand these instructions.  Will watch your condition.  Will get help right away if you are not doing well or get worse. Document Released: 11/13/2004 Document Revised: 08/05/2011 Document Reviewed: 03/14/2011 [facility] Patient Information 2015 [place], [place]. This information is not intended to replace advice given to you by your health care provider. Make sure you discuss any questions you have with your health care provider.

## 2014-11-01 NOTE — ED Provider Notes (Signed)
CSN: [id number]     Arrival date & time 11/01/14  1833 History   First MD Initiated Contact with Patient 11/01/14 1843     Chief Complaint  Patient presents with  . Altered Mental Status     (Consider location/radiation/quality/duration/timing/severity/associated sxs/prior Treatment) HPI Jaclyn [name] is a 79 y.o. female with multiple medical problems, presents to emergency department after an episode of altered mental status while at home. Patient states she suddenly started feeling dizzy, lightheaded, states "my head was spinning", states she felt like her blood sugar may be falling so she went and drank a glass of milk. During this time she states that her son called her, and she was able to answer. She states that rapidly her symptoms got worse to the point where she had to lower herself to the ground because she could not walk. According to the son, the patient seemed "out of it." He denies slurred speech, denies confusion, however he states she was not talking like her normal self. EMS was called to the house by him. When he arrived to the house, EMS was already assessing patient. Patient seemed to be feeling better at that time. Blood sugar was checked and it was 200. Patient denies taking any wrong medications or doubling the dose is. She denies drugs or alcohol. She denies falling or hitting her head. She denies syncope. Patient states that she feels like her tongue is not moving as well as normal, and it is dry. She denies headache. No numbness or weakness in extremities. No pain anywhere else.  Past Medical History  Diagnosis Date  . Unspecified essential hypertension   . Other chest pain   . Atrial fibrillation   . Cerebrovascular disease, unspecified   . Other and unspecified hyperlipidemia   . Type II or unspecified type diabetes mellitus without mention of complication, not stated as uncontrolled   . Unspecified hypothyroidism   . Diverticulosis of colon (without mention of  hemorrhage)   . Benign neoplasm of colon   . Other specified benign mammary dysplasias   . Urinary tract infection, site not specified   . Osteoarthrosis, unspecified whether generalized or localized, unspecified site   . Myalgia and myositis, unspecified   . Unspecified vitamin D deficiency   . Headache(784.0)   . Dysthymic disorder   . Insomnia, unspecified   . Carotid bruit   . Carotid artery occlusion     60-79% right ICA stenosis  . Fibromyalgia   . Interstitial cystitis   . Difficult intubation 2008    During surgery to remove large polyp   Past Surgical History  Procedure Laterality Date  . Vesicovaginal fistula closure w/ tah  1990    w/ cystocele &retocele repairs Dr. Ree Edman  . Right colectomy  2005    for villous adenoma of the cecum Dr.streck  . Cardiac catheterization  02/19/06    EF 60%  . Colonoscopy    . Abdominal hysterectomy  1988    cervical dysplasia   Family History  Problem Relation Age of Onset  . Lymphoma Father   . Hypertension Father   . Stroke Father   . Hyperlipidemia Father   . Hypertension Mother   . Allergies Brother   . Hypertension Sister     3  . Breast cancer Sister   . Fibromyalgia Sister     3  . Hyperlipidemia Sister     3   Social History  Substance Use Topics  . Smoking status: Never Smoker   .  Smokeless tobacco: Never Used  . Alcohol Use: No   OB History    No data available     Review of Systems  Constitutional: Negative for fever and chills.  Respiratory: Negative for cough, chest tightness and shortness of breath.   Cardiovascular: Negative for chest pain, palpitations and leg swelling.  Gastrointestinal: Negative for nausea, vomiting, abdominal pain and diarrhea.  Genitourinary: Negative for dysuria, flank pain and pelvic pain.  Musculoskeletal: Positive for gait problem. Negative for myalgias, arthralgias, neck pain and neck stiffness.  Skin: Negative for rash.  Neurological: Positive for dizziness, speech  difficulty, weakness and light-headedness. Negative for headaches.  Psychiatric/Behavioral: Positive for confusion.  All other systems reviewed and are negative.     Allergies  Naproxen sodium and Sulfonamide derivatives  Home Medications   Prior to Admission medications   Medication Sig Start Date End Date Taking? Authorizing Provider  B Complex Vitamins (VITAMIN-B COMPLEX PO) Take 1 tablet by mouth daily.      Historical Provider, MD  carvedilol (COREG) 6.25 MG tablet Take 1 tablet (6.25 mg total) by mouth 2 (two) times daily. 06/30/14   Josue Hector, MD  cholecalciferol (VITAMIN D) 1000 UNITS tablet Take 2,000 Units by mouth daily.     Historical Provider, MD  DULoxetine (CYMBALTA) 30 MG capsule TAKE 2 CAPSULES (60 MG TOTAL) BY MOUTH DAILY. 10/03/14   Amy Cletis Athens, MD  glipiZIDE (GLUCOTROL) 10 MG tablet Take 1 tablet (10 mg total) by mouth daily before breakfast. 03/29/14   Amy E Diona Browner, MD  glucose blood test strip 1 each by Other route as needed. Use as instructed check blood sugars once daily     Historical Provider, MD  Insulin Glargine (LANTUS SOLOSTAR) 100 UNIT/ML Solostar Pen Inject 65 Units into the skin at bedtime. 06/06/14   Jinny Sanders, MD  Insulin Pen Needle 29G X 12.7MM MISC Use as directed 10/08/12   Noralee Space, MD  levothyroxine (SYNTHROID, LEVOTHROID) 112 MCG tablet TAKE 1 TABLET BY MOUTH ONCE A DAY 03/16/14   Amy Cletis Athens, MD  metFORMIN (GLUMETZA) 500 MG (MOD) 24 hr tablet Take 2 tablets (1,000 mg total) by mouth daily with breakfast. 07/11/14   Amy Cletis Athens, MD  methocarbamol (ROBAXIN) 500 MG tablet Take 1 tablet (500 mg total) by mouth every 8 (eight) hours as needed for muscle spasms. 05/04/13   Noralee Space, MD  quinapril (ACCUPRIL) 40 MG tablet Take 1 tablet (40 mg total) by mouth daily. 06/30/14   Josue Hector, MD  ranitidine (ZANTAC) 150 MG tablet Take 1 tablet (150 mg total) by mouth 2 (two) times daily. Patient taking differently: Take 150 mg by mouth 2  (two) times daily as needed for heartburn.  10/05/12   Laban Emperor Zehr, PA-C  vitamin B-12 (CYANOCOBALAMIN) 100 MCG tablet Take 50 mcg by mouth daily.      Historical Provider, MD   BP 109/67 mmHg  Pulse 69  Temp(Src) 98.1 F (36.7 C) (Oral)  Resp 13  SpO2 98% Physical Exam  Constitutional: She is oriented to person, place, and time. She appears well-developed and well-nourished. No distress.  HENT:  Head: Normocephalic and atraumatic.  Patient lips and oral mucosa dry.  Eyes: Conjunctivae and EOM are normal. Pupils are equal, round, and reactive to light.  Neck: Normal range of motion. Neck supple.  Cardiovascular: Normal rate, regular rhythm and normal heart sounds.   Pulmonary/Chest: Effort normal and breath sounds normal. No respiratory distress. She has  no wheezes. She has no rales.  Abdominal: Soft. Bowel sounds are normal. She exhibits no distension. There is no tenderness. There is no rebound.  Musculoskeletal: She exhibits no edema.  Neurological: She is alert and oriented to person, place, and time.  5/5 and equal upper and lower extremity strength bilaterally. Equal grip strength bilaterally. Normal finger to nose and heel to shin. No pronator drift.   Skin: Skin is warm and dry.  Psychiatric: She has a normal mood and affect. Her behavior is normal.  Patient appears to be excessively happy, giggling inappropriately.  Nursing note and vitals reviewed.   ED Course  Procedures (including critical care time) Labs Review Labs Reviewed  COMPREHENSIVE METABOLIC PANEL - Abnormal; Notable for the following:    Chloride 100 (*)    Glucose, Bld 259 (*)    All other components within normal limits  URINALYSIS, ROUTINE W REFLEX MICROSCOPIC (NOT AT [facility]) - Abnormal; Notable for the following:    APPearance CLOUDY (*)    Nitrite POSITIVE (*)    Leukocytes, UA SMALL (*)    All other components within normal limits  URINE MICROSCOPIC-ADD ON - Abnormal; Notable for the following:     Squamous Epithelial / LPF FEW (*)    Bacteria, UA MANY (*)    All other components within normal limits  CBG MONITORING, ED - Abnormal; Notable for the following:    Glucose-Capillary 228 (*)    All other components within normal limits  URINE CULTURE  CBC  ETHANOL  I-STAT TROPOININ, ED    Imaging Review Ct Head Wo Contrast  11/01/2014   CLINICAL DATA:  Dizziness and syncope. Fall to floor at 5 p.m. today.  EXAM: CT HEAD WITHOUT CONTRAST  TECHNIQUE: Contiguous axial images were obtained from the base of the skull through the vertex without intravenous contrast.  COMPARISON:  02/21/2013  FINDINGS: Ventricles, cisterns and other CSF spaces are within normal. Bilateral basal ganglia calcifications are present. There is chronic ischemic microvascular disease present. There is no mass, mass effect, shift of midline structures or acute hemorrhage. Remaining bones and soft tissues are within normal.  IMPRESSION: No acute intracranial findings.  Chronic ischemic microvascular disease.   Electronically Signed   By: Marin Olp M.D.   On: 11/01/2014 20:02   I have personally reviewed and evaluated these images and lab results as part of my medical decision-making.   EKG Interpretation   Date/Time:  Wednesday November 01 2014 18:39:44 EDT Ventricular Rate:  69 PR Interval:  150 QRS Duration: 87 QT Interval:  410 QTC Calculation: 439 R Axis:   -81 Text Interpretation:  Sinus rhythm Left anterior fascicular block RSR' in  V1 or V2, right VCD or RVH Consider anterior infarct Nonspecific T  abnormalities, lateral leads ST elevation, consider inferior injury  Baseline wander in lead(s) V1 V5 V6 Confirmed by Lacinda Axon  MD, BRIAN ([id number])  on 11/01/2014 9:17:18 PM      MDM   Final diagnoses:  None   patient in emergency department with episode of weakness, dizziness, possibly hypoglycemic episode. By the time EMS got to her patient states she had some milk to drink. At this time patient is very  talkative, giggling, no focal neuro deficit on exam. Will get labs, urinalysis, will monitor.  10:25 PM Patient's CT and labs are unremarkable.  Son is a bedside, states this behavior is normal for pt.  Patient's urine is infected, will send cultures, will start on Keflex. This time given patient  is nontoxic, no evidence of sepsis, at baseline, outpatient treatment is appropriate. Her vital signs are normal. Plan to discharge home with close outpatient follow-up. Discussed return precautions, sun and patient are both aware and moist understanding of the plan.  Filed Vitals:   11/01/14 2112 11/01/14 2130 11/01/14 2145 11/01/14 2200  BP:  147/92 130/79 133/53  Pulse: 76 85 79 80  Temp:      TempSrc:      Resp: 12 20 18 17   SpO2: 96% 95% 94% 87%         Jeannett Senior, PA-C 11/01/14 [place], MD 11/01/14 2241

## 2014-11-02 LAB — I-STAT TROPONIN, ED: TROPONIN I, POC: 0.01 ng/mL (ref 0.00–0.08)

## 2014-11-03 LAB — URINE CULTURE

## 2014-11-06 ENCOUNTER — Other Ambulatory Visit: Payer: Self-pay | Admitting: Family Medicine

## 2014-11-09 ENCOUNTER — Ambulatory Visit (INDEPENDENT_AMBULATORY_CARE_PROVIDER_SITE_OTHER): Payer: Medicare Other | Admitting: Family Medicine

## 2014-11-09 ENCOUNTER — Encounter: Payer: Self-pay | Admitting: Family Medicine

## 2014-11-09 VITALS — BP 120/54 | HR 86 | Temp 98.2°F | Ht 62.5 in | Wt 158.2 lb

## 2014-11-09 DIAGNOSIS — I10 Essential (primary) hypertension: Secondary | ICD-10-CM

## 2014-11-09 DIAGNOSIS — E1165 Type 2 diabetes mellitus with hyperglycemia: Secondary | ICD-10-CM

## 2014-11-09 DIAGNOSIS — E785 Hyperlipidemia, unspecified: Secondary | ICD-10-CM | POA: Diagnosis not present

## 2014-11-09 DIAGNOSIS — Z23 Encounter for immunization: Secondary | ICD-10-CM

## 2014-11-09 LAB — POCT URINALYSIS DIP (MANUAL ENTRY)
BILIRUBIN UA: NEGATIVE
BILIRUBIN UA: NEGATIVE
Blood, UA: NEGATIVE
Glucose, UA: NEGATIVE
Leukocytes, UA: NEGATIVE
Nitrite, UA: NEGATIVE
PH UA: 5.5
PROTEIN UA: NEGATIVE
Urobilinogen, UA: 0.2

## 2014-11-09 LAB — HM DIABETES FOOT EXAM

## 2014-11-09 NOTE — Assessment & Plan Note (Signed)
Likely poor control, due for re-eval. Refer to nutritionist, continue current meds until A1C returns.  Start back with exercise.

## 2014-11-09 NOTE — Assessment & Plan Note (Signed)
Well controlled. Continue current medication.  

## 2014-11-09 NOTE — Patient Instructions (Addendum)
Stop at lab on way out to check DM labs and cholesterol. Stop at front desk to set up referral to nutritionist.  Increase exercise to walking 3 times  A week.

## 2014-11-09 NOTE — Assessment & Plan Note (Signed)
Due for re-eval. 

## 2014-11-09 NOTE — Progress Notes (Signed)
Pre visit review using our clinic review tool, if applicable. No additional management support is needed unless otherwise documented below in the visit note. 

## 2014-11-09 NOTE — Progress Notes (Signed)
79 year old female presents for 6 month follow up on DM  Diabetes: Poor control on glucovance max and lantus 55 Units at last OV, due for re-eval.  At 04/2014 OV increased lantus to 65 UNits daily and conitnued oral medsGood compliance with medication. Lab Results  Component Value Date   HGBA1C 8.4* 05/08/2014  Using medications without difficulties:. See above Hypoglycemic episodes: None Hyperglycemic episodes:occ after poor diet Feet problems: none Blood Sugars averaging: FBS 150 2 hours after meals  Elevated not sure numbers, ? 300 eye exam within last year: done Diet: Moderate  Exercise:None   Hypertension:  Well controlled. BP Readings from Last 3 Encounters:  11/09/14 120/54  11/01/14 133/53  06/30/14 157/77   Using medication without problems or lightheadedness: None Chest pain with exertion:None Edema:None Short of breath:None Average home BPs:not checking. Other issues:     Seen in ER on  11/01/2014 for AMS, episode of weakness, dizziness, possibly hypoglycemic episode ( did not check blood sugar at home, after milk CBG in ER was 259).. Patient's CT and labs are unremarkable. Dx with UTI, treated with keflex.  Urine culture return inconclusive.  No further dizziness, or wobbliness, no falls.    Review of Systems  Constitutional: Positive for fatigue. Negative for fever.  HENT: Negative for ear pain.  Eyes: Negative for pain.  Respiratory: Negative for shortness of breath.  Cardiovascular: Negative for chest pain.  Gastrointestinal: Negative for abdominal pain.       Objective:  Physical Exam  Constitutional: Vital signs are normal. She appears well-developed and well-nourished. She is cooperative. Non-toxic appearance. She does not appear ill. No distress.  HENT:  Head: Normocephalic.  Right Ear: Hearing, tympanic membrane, external ear and ear canal normal. Tympanic membrane is not erythematous, not retracted and not bulging.   Left Ear: Hearing, tympanic membrane, external ear and ear canal normal. Tympanic membrane is not erythematous, not retracted and not bulging.  Nose: No mucosal edema or rhinorrhea. Right sinus exhibits no maxillary sinus tenderness and no frontal sinus tenderness. Left sinus exhibits no maxillary sinus tenderness and no frontal sinus tenderness.  Mouth/Throat: Uvula is midline, oropharynx is clear and moist and mucous membranes are normal.  Eyes: Conjunctivae, EOM and lids are normal. Pupils are equal, round, and reactive to light. Lids are everted and swept, no foreign bodies found.  Neck: Trachea normal and normal range of motion. Neck supple. Carotid bruit is not present. No mass and no thyromegaly present.  Cardiovascular: Normal rate, regular rhythm, S1 normal, S2 normal, normal heart sounds, intact distal pulses and normal pulses. Exam reveals no gallop and no friction rub.  No murmur heard. Pulmonary/Chest: Effort normal and breath sounds normal. Not tachypneic. No respiratory distress. She has no decreased breath sounds. She has no wheezes. She has no rhonchi. She has no rales.  Abdominal: Soft. Normal appearance and bowel sounds are normal. There is no tenderness.  Skin: Skin is warm, dry and intact. No rash noted.  Psychiatric: Her speech is normal and behavior is normal. Judgment and thought content normal. Her mood appears not anxious. Cognition and memory are normal. Sh, e does not exhibit a depressed mood.   Diabetic foot exam: Normal inspection No skin breakdown No calluses  Normal DP pulses Slightly decreased sensation to light touch and monofilament in toes. Nails normal

## 2014-11-10 LAB — LIPID PANEL
Cholesterol: 193 mg/dL (ref 0–200)
HDL: 41.4 mg/dL
NonHDL: 151.85
Total CHOL/HDL Ratio: 5
Triglycerides: 336 mg/dL — ABNORMAL HIGH (ref 0.0–149.0)
VLDL: 67.2 mg/dL — ABNORMAL HIGH (ref 0.0–40.0)

## 2014-11-10 LAB — COMPREHENSIVE METABOLIC PANEL WITH GFR
ALT: 24 U/L (ref 0–35)
AST: 22 U/L (ref 0–37)
Albumin: 4.3 g/dL (ref 3.5–5.2)
Alkaline Phosphatase: 67 U/L (ref 39–117)
BUN: 14 mg/dL (ref 6–23)
CO2: 26 meq/L (ref 19–32)
Calcium: 10 mg/dL (ref 8.4–10.5)
Chloride: 102 meq/L (ref 96–112)
Creatinine, Ser: 0.82 mg/dL (ref 0.40–1.20)
GFR: 71.8 mL/min
Glucose, Bld: 135 mg/dL — ABNORMAL HIGH (ref 70–99)
Potassium: 3.9 meq/L (ref 3.5–5.1)
Sodium: 139 meq/L (ref 135–145)
Total Bilirubin: 0.4 mg/dL (ref 0.2–1.2)
Total Protein: 7.5 g/dL (ref 6.0–8.3)

## 2014-11-10 LAB — HEMOGLOBIN A1C: Hgb A1c MFr Bld: 8.2 % — ABNORMAL HIGH (ref 4.6–6.5)

## 2014-11-10 LAB — LDL CHOLESTEROL, DIRECT: Direct LDL: 133 mg/dL

## 2014-11-14 ENCOUNTER — Telehealth: Payer: Self-pay | Admitting: *Deleted

## 2014-11-14 NOTE — Telephone Encounter (Signed)
Agreed -

## 2014-11-14 NOTE — Telephone Encounter (Signed)
Called to discuss lab results with Jaclyn [name].  She states she can't take statins because they make her hurt really bad.  She is open to adding a new diabetic medication.  She then proceeded to say she was down visiting her sister and began having trouble with her speech again and was just out of it.  They called an ambulance and she was admitted to [facility] where they ran a lot of test.  .  She states they told her they her scans/test do not show that she had a stroke but the doctor thinks she really is having mini strokes.  She was started on Eliquis and discharged home today.  She has a follow up appointment scheduled with Dr. Diona Browner on Tuesday 11/21/2014 at 1:30 pm.  Records requested from [place] we can discuss her labs and medication options further at her appointment on Tuesday.

## 2014-11-14 NOTE — Telephone Encounter (Signed)
-----   Message from Jaclyn Sanders, MD sent at 11/10/2014  4:49 PM EDT ----- Notify pt poor control DM and poor control cholesterol. Recommend statin medication to treat cholesterol.  Is she open to adding a medication for better control of DM?  Keep appt with nutritionist. Make sure she has follow up with labs prior in 3 months.

## 2014-11-17 ENCOUNTER — Ambulatory Visit: Payer: Medicare Other | Admitting: Family Medicine

## 2014-11-21 ENCOUNTER — Ambulatory Visit: Payer: Medicare Other | Admitting: Family Medicine

## 2014-11-24 ENCOUNTER — Ambulatory Visit (INDEPENDENT_AMBULATORY_CARE_PROVIDER_SITE_OTHER): Payer: Medicare Other | Admitting: Family Medicine

## 2014-11-24 ENCOUNTER — Encounter: Payer: Self-pay | Admitting: Family Medicine

## 2014-11-24 VITALS — BP 132/60 | HR 83 | Temp 98.0°F | Wt 157.5 lb

## 2014-11-24 DIAGNOSIS — G459 Transient cerebral ischemic attack, unspecified: Secondary | ICD-10-CM | POA: Diagnosis not present

## 2014-11-24 NOTE — Patient Instructions (Signed)
Let me talk to [facility] in the meantime. Don't change your meds for now, except for the insulin.  Add 1 unit of insulin daily when your morning sugar is above 121.  If your morning sugar is below 89, then decrease by 1 unit.   If your sugar is 90 to 120, then don't change your dose.  Take care.  Glad to see you.

## 2014-11-24 NOTE — Progress Notes (Signed)
Pre visit review using our clinic review tool, if applicable. No additional management support is needed unless otherwise documented below in the visit note.  Hospital f/u.  Seen in place of PCP.   Speech was affected transiently.  Likely TIA with neg imaging.  Carotid and echo was done, w/o sig findings.  D/w pt at OV.   Sx resolved in the meantime, no lasting focal changes.   Started on eliquis.  Statin intolerant per patient.   H/o DM2.  Sugar has been usually ~100-300.  Usually <250.   In the last week sugar has been ~100, and that is a recent change.   No new focal neuro changes.  H/o PAF noted.   PMH and SH reviewed  ROS: See HPI, otherwise noncontributory.  Meds, vitals, and allergies reviewed.   GEN: nad, alert and oriented HEENT: mucous membranes moist NECK: supple w/o LA CV: rrr. PULM: ctab, no inc wob ABD: soft, +bs EXT: no edema SKIN: no acute rash CN 2-12 wnl B, S/S/DTR wnl x4 except for dec hearing in L ear at baseline- old finding.

## 2014-11-27 ENCOUNTER — Encounter: Payer: Self-pay | Admitting: Family Medicine

## 2014-11-27 DIAGNOSIS — G459 Transient cerebral ischemic attack, unspecified: Secondary | ICD-10-CM | POA: Insufficient documentation

## 2014-11-27 NOTE — Assessment & Plan Note (Signed)
Now anticoagulated.  With echo and carotid study done as inpatient.  D/w pt about secondary prevention.  She declined statin tx.  D/w pt about Dm2 med adjustment.  Add 1 unit of insulin daily when your morning sugar is above 121.  If your morning sugar is below 89, then decrease by 1 unit.  If your sugar is 90 to 120, then don't change your dose.  Will routed to PCP as FYI for the visit and re: f/u.  Will defer to PCP o/w  Routine cautions given to patient.  >25 minutes spent in face to face time with patient, >50% spent in counselling or coordination of care.

## 2014-12-12 ENCOUNTER — Other Ambulatory Visit: Payer: Self-pay | Admitting: Family Medicine

## 2014-12-12 NOTE — Telephone Encounter (Signed)
Needs to schedule TSH check if not already scheudled. Refilled x 1

## 2014-12-12 NOTE — Telephone Encounter (Signed)
Left message for Jaclyn [name] to return my call.  Needs to scheduled lab only appointment to check TSH (thyroid).

## 2014-12-12 NOTE — Telephone Encounter (Signed)
Last office visit 11/24/2014 with Dr. Damita Dunnings.  Last TSH checked 08/25/2013.  Ok to refill?

## 2014-12-13 NOTE — Telephone Encounter (Signed)
Left message for [name] to return my call.

## 2014-12-14 ENCOUNTER — Telehealth: Payer: Self-pay

## 2014-12-14 ENCOUNTER — Other Ambulatory Visit: Payer: Self-pay | Admitting: Family Medicine

## 2014-12-14 NOTE — Telephone Encounter (Signed)
Spoke with Jaclyn [name] and advised her that she needs to come in to the office for TSH lab prior to getting any additional refills on her levothyroxine.  She will call back and schedule lab appointment.

## 2014-12-14 NOTE — Telephone Encounter (Signed)
Patient left a message stating that she was returning Donna's call. Thanks!

## 2014-12-24 NOTE — Progress Notes (Signed)
Patient ID: Jaclyn [name], female   DOB: 1935-05-11, 79 y.o.   MRN: [id number] Jaclyn [name] is seen today fo rf/U of HTN, elevated lipids previous atypical SSCP and right carotid bruit. She's had a normal cath in 2008. She has known RICA dx. I reviewed her duplex from today  and she has stable 60-79% RICA stenosis (220/25), and 4-27% LICA stenosis. She has had no recurrent SSCP, no TIA like symptoms. She has been compliant with her meds. She is intolerant to statins. She needs to have her cholesterol checked at Dr. Brendolyn [name]. Othewise she is doing well Some fibromyalgia and neuropatthy. No history of PVC or claudication.   Still with somewhat poorly controlled DM  A1c in 8 range  Poor diet  Lantus recently increased by Dr Jaclyn [name]   Sept 25/16     60-79% RICA stentosis stable   Lots of pain from fibromyalgia Thinks toprol is contributing to depression  HR and BP up last visit and increased coreg and increased ACE  ROS: Denies fever, malais, weight loss, blurry vision, decreased visual acuity, cough, sputum, SOB, hemoptysis, pleuritic pain, palpitaitons, heartburn, abdominal pain, melena, lower extremity edema, claudication, or rash.  All other systems reviewed and negative  General: Affect appropriate Healthy:  appears stated age 79: normal Neck supple with no adenopathy JVP normal right bruits no thyromegaly Lungs clear with no wheezing and good diaphragmatic motion Heart:  S1/S2 no murmur, no rub, gallop or click PMI normal Abdomen: benighn, BS positve, no tenderness, no AAA no bruit.  No HSM or HJR Distal pulses intact with no bruits No edema Neuro non-focal Skin warm and dry No muscular weakness   Current Outpatient Prescriptions  Medication Sig Dispense Refill  . apixaban (ELIQUIS) 5 MG TABS tablet Take 5 mg by mouth 2 (two) times daily.    . B Complex Vitamins (VITAMIN-B COMPLEX PO) Take 1 tablet by mouth daily.      . carvedilol (COREG) 6.25 MG tablet Take 1 tablet (6.25 mg total)  by mouth 2 (two) times daily. 180 tablet 3  . cholecalciferol (VITAMIN D) 1000 UNITS tablet Take 2,000 Units by mouth daily.     . DULoxetine (CYMBALTA) 30 MG capsule TAKE 2 CAPSULES (60 MG TOTAL) BY MOUTH DAILY. (Patient taking differently: TAKE 30 mg twice a day.) 60 capsule 5  . glipiZIDE (GLUCOTROL) 10 MG tablet Take 1 tablet (10 mg total) by mouth daily before breakfast. 90 tablet 3  . glucose blood test strip 1 each by Other route as needed. Use as instructed check blood sugars once daily     . Insulin Pen Needle 29G X 12.7MM MISC Use as directed 100 each 5  . LANTUS SOLOSTAR 100 UNIT/ML Solostar Pen INJECT 65 UNITS INTO THE SKIN AT BEDTIME. 15 pen 5  . levothyroxine (SYNTHROID, LEVOTHROID) 112 MCG tablet TAKE 1 TABLET BY MOUTH ONCE A DAY 30 tablet 0  . metFORMIN (GLUCOPHAGE-XR) 500 MG 24 hr tablet TAKE 2 TABLETS (1,000 MG TOTAL) BY MOUTH DAILY WITH BREAKFAST. 60 tablet 3  . methocarbamol (ROBAXIN) 500 MG tablet Take 1 tablet (500 mg total) by mouth every 8 (eight) hours as needed for muscle spasms. 90 tablet 1  . quinapril (ACCUPRIL) 40 MG tablet Take 1 tablet (40 mg total) by mouth daily. (Patient taking differently: Take 40 mg by mouth at bedtime. ) 90 tablet 3  . ranitidine (ZANTAC) 150 MG tablet Take 1 tablet (150 mg total) by mouth 2 (two) times daily. 60 tablet 2  . vitamin  B-12 (CYANOCOBALAMIN) 100 MCG tablet Take 50 mcg by mouth daily.       No current facility-administered medications for this visit.    Allergies  Naproxen sodium; Statins; Sulfonamide derivatives; Latex; and Shellfish allergy  Electrocardiogram:   05/27/13  SR LAFB  Poor R wave progression  06/30/14  ST rate 109  Poor R wave progression increased HR since previous   Assessment and Plan HTN:  Improved with med adjustments   Carotid:  Stable 60-79% RICA stenosis.  F/U carotid duplex in 6 months March 2017  DM:  Discussed low carb diet.  Target hemoglobin A1c is 6.5 or less.  Continue current medications. MS:   Seems to have some manic tendencyies with depression f/u with primary Currently on cymbalta   [employer]

## 2014-12-26 ENCOUNTER — Encounter: Payer: Medicare Other | Admitting: Cardiovascular Disease

## 2015-01-06 NOTE — Progress Notes (Signed)
Patient ID: Jaclyn [name], female   DOB: 09/08/1935, 79 y.o.   MRN: [id number] Channie is seen today fo rf/U of HTN, elevated lipids previous atypical SSCP and right carotid bruit. She's had a normal cath in 2008. She has known RICA dx. I reviewed her duplex from today  and she has stable 60-79% RICA stenosis (Q000111Q), and 123456 LICA stenosis. She has had no recurrent SSCP, no TIA like symptoms. She has been compliant with her meds. She is intolerant to statins. She needs to have her cholesterol checked at Dr. [name]. Othewise she is doing well Some fibromyalgia and neuropatthy. No history of PVC or claudication.   Still with somewhat poorly controlled DM  A1c in 8 range  Poor diet  Lantus recently increased by Dr [name]   [id number]     0000000  LICA stentosis Duplex today reviewed and no change   Lots of pain from fibromyalgia Thinks toprol is contributing to depression  HR and BP up up last visit and meds adjusted  ROS: Denies fever, malais, weight loss, blurry vision, decreased visual acuity, cough, sputum, SOB, hemoptysis, pleuritic pain, palpitaitons, heartburn, abdominal pain, melena, lower extremity edema, claudication, or rash.  All other systems reviewed and negative  General: Affect appropriate Healthy:  appears stated age 31: normal Neck supple with no adenopathy JVP normal right bruits no thyromegaly Lungs clear with no wheezing and good diaphragmatic motion Heart:  S1/S2 no murmur, no rub, gallop or click PMI normal Abdomen: benighn, BS positve, no tenderness, no AAA no bruit.  No HSM or HJR Distal pulses intact with no bruits No edema Neuro non-focal Skin warm and dry No muscular weakness   Current Outpatient Prescriptions  Medication Sig Dispense Refill  . apixaban (ELIQUIS) 5 MG TABS tablet Take 5 mg by mouth 2 (two) times daily.    . B Complex Vitamins (VITAMIN-B COMPLEX PO) Take 1 tablet by mouth daily.      . carvedilol (COREG) 6.25 MG tablet Take 1 tablet (6.25  mg total) by mouth 2 (two) times daily. 180 tablet 3  . cholecalciferol (VITAMIN D) 1000 UNITS tablet Take 2,000 Units by mouth daily.     . DULoxetine (CYMBALTA) 30 MG capsule TAKE 2 CAPSULES (60 MG TOTAL) BY MOUTH DAILY. (Patient taking differently: TAKE 30 mg twice a day.) 60 capsule 5  . glipiZIDE (GLUCOTROL) 10 MG tablet Take 1 tablet (10 mg total) by mouth daily before breakfast. 90 tablet 3  . glucose blood test strip 1 each by Other route as needed. Use as instructed check blood sugars once daily     . Insulin Pen Needle 29G X 12.7MM MISC Use as directed 100 each 5  . LANTUS SOLOSTAR 100 UNIT/ML Solostar Pen INJECT 65 UNITS INTO THE SKIN AT BEDTIME. 15 pen 5  . levothyroxine (SYNTHROID, LEVOTHROID) 112 MCG tablet TAKE 1 TABLET BY MOUTH ONCE A DAY 30 tablet 0  . metFORMIN (GLUCOPHAGE-XR) 500 MG 24 hr tablet TAKE 2 TABLETS (1,000 MG TOTAL) BY MOUTH DAILY WITH BREAKFAST. 60 tablet 3  . methocarbamol (ROBAXIN) 500 MG tablet Take 1 tablet (500 mg total) by mouth every 8 (eight) hours as needed for muscle spasms. 90 tablet 1  . quinapril (ACCUPRIL) 40 MG tablet Take 1 tablet (40 mg total) by mouth daily. (Patient taking differently: Take 40 mg by mouth at bedtime. ) 90 tablet 3  . ranitidine (ZANTAC) 150 MG tablet Take 1 tablet (150 mg total) by mouth 2 (two) times daily. 60 tablet 2  .  vitamin B-12 (CYANOCOBALAMIN) 100 MCG tablet Take 50 mcg by mouth daily.       No current facility-administered medications for this visit.    Allergies  Naproxen sodium; Statins; Sulfonamide derivatives; Latex; and Shellfish allergy  Electrocardiogram:   05/27/13  SR LAFB  Poor R wave progression  06/30/14  ST rate 109  Poor R wave progression increased HR since previous   Assessment and Plan HTN:  Not clear why BP/HR up  Change to coreg 6.25 bid and increase accupril to 40 mg  F/u next available Carotid:  Stable 0000000 LICA stenosis.  F/U carotid duplex March 2017 DM:  Discussed low carb diet.  Target  hemoglobin A1c is 6.5 or less.  Continue current medications. MS:  Seems to have some manic tendencyies with depression f/u with primary Currently on cymbalta   [employer]

## 2015-01-08 ENCOUNTER — Encounter: Payer: Medicare Other | Admitting: Cardiovascular Disease

## 2015-01-09 ENCOUNTER — Other Ambulatory Visit: Payer: Self-pay | Admitting: Family Medicine

## 2015-01-09 ENCOUNTER — Telehealth: Payer: Self-pay | Admitting: Family Medicine

## 2015-01-09 NOTE — Telephone Encounter (Signed)
Let pt know she needs to come in for TSH  Check prior to further refills.

## 2015-01-09 NOTE — Telephone Encounter (Signed)
Expand All Collapse All   Last office visit 11/24/2014 with Dr. Damita Dunnings. Last TSH checked 08/25/2013. Ok to refill?  Patient notified last refill to come in for TSH.  No appointments scheduled.  Refill?

## 2015-01-09 NOTE — Telephone Encounter (Signed)
Left message for Jaclyn [name] to call the office and schedule a lab appointment to have her TSH drawn.  Can't refill her levothyroxine until she comes in for labs per Dr. Diona Browner.

## 2015-01-23 ENCOUNTER — Telehealth: Payer: Self-pay | Admitting: Family Medicine

## 2015-01-23 DIAGNOSIS — E559 Vitamin D deficiency, unspecified: Secondary | ICD-10-CM

## 2015-01-23 DIAGNOSIS — E1165 Type 2 diabetes mellitus with hyperglycemia: Secondary | ICD-10-CM

## 2015-01-23 DIAGNOSIS — E785 Hyperlipidemia, unspecified: Secondary | ICD-10-CM

## 2015-01-23 DIAGNOSIS — E039 Hypothyroidism, unspecified: Secondary | ICD-10-CM

## 2015-01-23 NOTE — Telephone Encounter (Signed)
-----   Message from Ellamae Sia sent at 01/23/2015 11:39 AM EST ----- Regarding: Lab orders for Friday,12.9.16 Lab orders, no f/u appt

## 2015-01-26 ENCOUNTER — Other Ambulatory Visit (INDEPENDENT_AMBULATORY_CARE_PROVIDER_SITE_OTHER): Payer: Medicare Other

## 2015-01-26 DIAGNOSIS — IMO0001 Reserved for inherently not codable concepts without codable children: Secondary | ICD-10-CM

## 2015-01-26 DIAGNOSIS — I519 Heart disease, unspecified: Secondary | ICD-10-CM

## 2015-01-26 DIAGNOSIS — E785 Hyperlipidemia, unspecified: Secondary | ICD-10-CM

## 2015-01-26 DIAGNOSIS — E1165 Type 2 diabetes mellitus with hyperglycemia: Secondary | ICD-10-CM

## 2015-01-26 DIAGNOSIS — E559 Vitamin D deficiency, unspecified: Secondary | ICD-10-CM

## 2015-01-26 DIAGNOSIS — E039 Hypothyroidism, unspecified: Secondary | ICD-10-CM

## 2015-01-26 LAB — COMPREHENSIVE METABOLIC PANEL
ALT: 27 U/L (ref 6–29)
AST: 22 U/L (ref 10–35)
Albumin: 4.2 g/dL (ref 3.6–5.1)
Alkaline Phosphatase: 78 U/L (ref 33–130)
BILIRUBIN TOTAL: 0.7 mg/dL (ref 0.2–1.2)
BUN: 10 mg/dL (ref 7–25)
CO2: 26 mmol/L (ref 20–31)
Calcium: 9.4 mg/dL (ref 8.6–10.4)
Chloride: 101 mmol/L (ref 98–110)
Creat: 0.77 mg/dL (ref 0.60–0.93)
Glucose, Bld: 279 mg/dL — ABNORMAL HIGH (ref 65–99)
POTASSIUM: 4.6 mmol/L (ref 3.5–5.3)
Sodium: 137 mmol/L (ref 135–146)
TOTAL PROTEIN: 7.2 g/dL (ref 6.1–8.1)

## 2015-01-26 LAB — HEMOGLOBIN A1C
Hgb A1c MFr Bld: 8.6 % — ABNORMAL HIGH (ref <5.7)
MEAN PLASMA GLUCOSE: 200 mg/dL — AB (ref <117)

## 2015-01-26 LAB — LIPID PANEL
CHOL/HDL RATIO: 5.5 ratio — AB (ref <=5.0)
CHOLESTEROL: 193 mg/dL (ref 125–200)
HDL: 35 mg/dL — AB (ref >=46)
LDL Cholesterol: 114 mg/dL (ref <130)
Triglycerides: 218 mg/dL — ABNORMAL HIGH (ref <150)
VLDL: 44 mg/dL — ABNORMAL HIGH (ref <30)

## 2015-01-26 LAB — TSH: TSH: 0.725 u[IU]/mL (ref 0.350–4.500)

## 2015-01-26 LAB — T4, FREE: Free T4: 1.23 ng/dL (ref 0.80–1.80)

## 2015-01-26 LAB — T3, FREE: T3, Free: 2.2 pg/mL — ABNORMAL LOW (ref 2.3–4.2)

## 2015-01-27 LAB — VITAMIN D 25 HYDROXY (VIT D DEFICIENCY, FRACTURES): VIT D 25 HYDROXY: 11 ng/mL — AB (ref 30–100)

## 2015-01-31 ENCOUNTER — Telehealth: Payer: Self-pay | Admitting: *Deleted

## 2015-01-31 NOTE — Telephone Encounter (Signed)
Tried calling pt .  Mail box full could not leave message

## 2015-01-31 NOTE — Telephone Encounter (Signed)
Shirlean Mylar, Will you please call and schedule Ms. Casella a follow up appointment with Dr. Diona Browner sometime in the next month to discuss labs.

## 2015-01-31 NOTE — Telephone Encounter (Signed)
-----   Message from Jinny Sanders, MD sent at 01/28/2015 11:27 PM EST ----- Pt needs appt made for 3 month follow up in next month to discuss labs.

## 2015-02-01 NOTE — Telephone Encounter (Signed)
Left message asking pt to call office  °

## 2015-02-02 NOTE — Telephone Encounter (Signed)
Appointment 1/20 Pt aware

## 2015-02-15 MED ORDER — LEVOTHYROXINE SODIUM 112 MCG PO TABS: 112.0000 ug | ORAL_TABLET | Freq: Every day | ORAL | Status: DC

## 2015-02-15 NOTE — Telephone Encounter (Signed)
Pt has been out of thyroid med for 4 days; pt scheduled to see Dr Diona Browner on 03/09/15. Refilled # 30 until seen [facility]. Pt voiced understanding.

## 2015-02-15 NOTE — Addendum Note (Signed)
Addended by: Helene Shoe on: 02/15/2015 04:29 PM   Modules accepted: Orders

## 2015-03-09 ENCOUNTER — Encounter: Payer: Self-pay | Admitting: Family Medicine

## 2015-03-09 ENCOUNTER — Ambulatory Visit (INDEPENDENT_AMBULATORY_CARE_PROVIDER_SITE_OTHER): Payer: Medicare Other | Admitting: Family Medicine

## 2015-03-09 VITALS — BP 102/60 | HR 81 | Temp 98.6°F | Ht 62.5 in | Wt 156.5 lb

## 2015-03-09 DIAGNOSIS — G459 Transient cerebral ischemic attack, unspecified: Secondary | ICD-10-CM | POA: Diagnosis not present

## 2015-03-09 DIAGNOSIS — R42 Dizziness and giddiness: Secondary | ICD-10-CM | POA: Diagnosis not present

## 2015-03-09 DIAGNOSIS — R300 Dysuria: Secondary | ICD-10-CM | POA: Insufficient documentation

## 2015-03-09 DIAGNOSIS — Z23 Encounter for immunization: Secondary | ICD-10-CM

## 2015-03-09 DIAGNOSIS — E785 Hyperlipidemia, unspecified: Secondary | ICD-10-CM

## 2015-03-09 DIAGNOSIS — I6521 Occlusion and stenosis of right carotid artery: Secondary | ICD-10-CM

## 2015-03-09 DIAGNOSIS — E1165 Type 2 diabetes mellitus with hyperglycemia: Secondary | ICD-10-CM

## 2015-03-09 DIAGNOSIS — I10 Essential (primary) hypertension: Secondary | ICD-10-CM

## 2015-03-09 LAB — HM DIABETES FOOT EXAM

## 2015-03-09 NOTE — Assessment & Plan Note (Signed)
Not at goal last OV. Encouraged low chol diet. likely will need med to be started.. Add at next OV.

## 2015-03-09 NOTE — Progress Notes (Signed)
80 year old female presents for 6 month follow up on DM as well as recsent hospital visit.  Diabetes: Poor control on metfromin  max, glucotrol Xl 10 mg daily. At 04/2014 OV increased lantus to 65 UNits daily and conitnued oral meds Good compliance with medication.  Lab Results  Component Value Date   HGBA1C 8.6* 01/26/2015  Using medications without difficulties:. See above Hypoglycemic episodes: None Hyperglycemic episodes:yes. Feet problems: none Blood Sugars averaging: not checking much eye exam within last year: due Diet: Moderate , pasta once a week, eats a lot of french fries, has eaten a lot over the holidays. Exercise:None  Elevated Cholesterol:  Not at goal last check on no emd. Lab Results  Component Value Date   CHOL 193 01/26/2015   HDL 35* 01/26/2015   LDLCALC 114 01/26/2015   LDLDIRECT 133.0 11/09/2014   TRIG 218* 01/26/2015   CHOLHDL 5.5* 01/26/2015    Using medications without problems: Muscle aches:  Diet compliance: poor Exercise:none Other complaints:   Hypertension:  good control, but lower than usual and pt with dizziness. BP Readings from Last 3 Encounters:  03/09/15 102/60  11/24/14 132/60  11/09/14 120/54  Using medication without problems or lightheadedness: Has dizziness at times. Chest pain with exertion:None Edema:None Short of breath:None Average home BPs:not checking. Other issues:    Seen in ER on 11/01/2014 at [facility]  for AMS, episode of weakness, dizziness, possibly hypoglycemic episode (did not check blood sugar at home, after milk CBG in ER was 259).. Patient's CT and labs are unremarkable. Dx with UTI, treated with keflex.  Urine culture return inconclusive.  She then was hospitalized in 11/14/2014 at [facility] for slurred speech, ataxia and left side weakness.  MRI brain showed chronic atrophy and microvascular change, no acute CVA.  Nml echo and carotids.  Started on eliquis and told to follow up with neuro.  She did not follow up given location. She has no further issue with this.  She has been dizziness/lightheadedness, no vertigo and blurred vision off an on.   She has noted some bussing in left ears in last month, better in last 1-2 weeks. Has history of decreased hearing in left ear.  Dysuria, intermittently in last few days.  No flank pain or fever. No urinary frequency.     Review of Systems  Constitutional: Positive for fatigue. Negative for fever.  HENT: Negative for ear pain.  Eyes: Negative for pain.  Respiratory: Negative for shortness of breath.  Cardiovascular: Negative for chest pain.  Gastrointestinal: Negative for abdominal pain.       Objective:  Physical Exam  Constitutional: Vital signs are normal. She appears well-developed and well-nourished. She is cooperative. Non-toxic appearance. She does not appear ill. No distress.  HENT:  Head: Normocephalic.  Right Ear: Hearing decreased, tympanic membrane scarred, external ear and ear canal normal. Tympanic membrane is not erythematous, not retracted and not bulging.  Left Ear: Hearing decreased, tympanic membrane scarred, external ear and ear canal normal. Tympanic membrane is not erythematous, not retracted and not bulging.  Nose: No mucosal edema or rhinorrhea. Right sinus exhibits no maxillary sinus tenderness and no frontal sinus tenderness. Left sinus exhibits no maxillary sinus tenderness and no frontal sinus tenderness.  Mouth/Throat: Uvula is midline, oropharynx is clear and moist and mucous membranes are normal.  Eyes: Conjunctivae, EOM and lids are normal. Pupils are equal, round, and reactive to light. Lids are everted and swept, no foreign bodies found.  Neck: Trachea normal and  normal range of motion. Neck supple. Carotid bruit is not present. No mass and no thyromegaly present.  Cardiovascular: Normal rate, regular rhythm, S1 normal, S2 normal, normal heart sounds, intact distal  pulses and normal pulses. Exam reveals no gallop and no friction rub.  No murmur heard. Pulmonary/Chest: Effort normal and breath sounds normal. Not tachypneic. No respiratory distress. She has no decreased breath sounds. She has no wheezes. She has no rhonchi. She has no rales.  Abdominal: Soft. Normal appearance and bowel sounds are normal. There is no tenderness.  Skin: Skin is warm, dry and intact. No rash noted.  Psychiatric: Her speech is normal and behavior is normal. Judgment and thought content normal. Her mood appears not anxious. She is somewhat histrionic, sometimes laughing inappropriately Cognition and memory are normal. Sh, e does not exhibit a depressed mood.  NEURo: no weakness in upper and lower ext bilaterally, nml cerebellar exam, nml cranial nerves, nml sensation  Diabetic foot exam: Normal inspection No skin breakdown No calluses  Normal DP pulses Slightly decreased sensation to light touch and monofilament in toes. Nails normal

## 2015-03-09 NOTE — Assessment & Plan Note (Signed)
Per [place] carotid stable 50-59%./

## 2015-03-09 NOTE — Progress Notes (Signed)
Pre visit review using our clinic review tool, if applicable. No additional management support is needed unless otherwise documented below in the visit note. 

## 2015-03-09 NOTE — Assessment & Plan Note (Signed)
Pt unable to given urine sample. Will return with sample on Monday.

## 2015-03-09 NOTE — Assessment & Plan Note (Signed)
May be causing a lot of her issues and certainly increases risk of CVA.  poor diet compliance, encouraged her to low carb diet.  Increase insulin to 75 units.  Check CBGs and follow up in 2 weeks for close re-eval for med adjustment.

## 2015-03-09 NOTE — Assessment & Plan Note (Signed)
IN 10/2014 concerning symptoms for TIA. Now on Eliquis without issue given Afib history.  Recommended referral to neuro.

## 2015-03-09 NOTE — Assessment & Plan Note (Addendum)
Well controlled but low.  ? Causing dizziness? Follow at home, close follow up in 2 weeks.

## 2015-03-09 NOTE — Patient Instructions (Addendum)
Check blood sugars daily in AMs x  2 weeks.  Decrease sugar and carb in diet.  Increase  lantus 75 units daily. Check BP at home. Write down measurements Stop at front desk to set up neurologist.

## 2015-03-14 ENCOUNTER — Other Ambulatory Visit (INDEPENDENT_AMBULATORY_CARE_PROVIDER_SITE_OTHER): Payer: Medicare Other | Admitting: Family Medicine

## 2015-03-14 DIAGNOSIS — R300 Dysuria: Secondary | ICD-10-CM | POA: Diagnosis not present

## 2015-03-14 LAB — POC URINALSYSI DIPSTICK (AUTOMATED)
BILIRUBIN UA: NEGATIVE
GLUCOSE UA: NEGATIVE
KETONES UA: NEGATIVE
Leukocytes, UA: NEGATIVE
Nitrite, UA: POSITIVE
Protein, UA: NEGATIVE
RBC UA: NEGATIVE
SPEC GRAV UA: 1.02
Urobilinogen, UA: 0.2
pH, UA: 6

## 2015-03-14 NOTE — Progress Notes (Signed)
Patient ID: Jaclyn [name], female   DOB: [birth year], 80 y.o.   MRN: [id number]   Jaclyn [name] is seen today fo rf/U of HTN, elevated lipids previous atypical SSCP and right carotid bruit. She's had a normal cath in 2008. She has known RICA dx. I reviewed her duplex from  11/12/14   and she has stable 60-79% RICA stenosis (Q000111Q), and 123456 LICA stenosis.  Still with somewhat poorly controlled DM  A1c in 8 range  Poor diet  Lantus recently increased by Dr Jaclyn [name]   11/12/14    60-79% RICA stentosis     Lots of pain from fibromyalgia Thinks toprol is contributing to depression  11/14/14 Seen in [facility] ? TIA started on Eliquis despite no documentation of PAF or embolic event  Slurred speech, ataxia and left side weakness. MRI brain showed chronic atrophy and microvascular change, no acute CVA. Nml echo and carotids.no high grade lesions ?  Started on eliquis and told to follow up with neuro.   ROS: Denies fever, malais, weight loss, blurry vision, decreased visual acuity, cough, sputum, SOB, hemoptysis, pleuritic pain, palpitaitons, heartburn, abdominal pain, melena, lower extremity edema, claudication, or rash.  All other systems reviewed and negative  General: Affect appropriate Healthy:  appears stated age 27: normal Neck supple with no adenopathy JVP normal right bruits no thyromegaly Lungs clear with no wheezing and good diaphragmatic motion Heart:  S1/S2 no murmur, no rub, gallop or click PMI normal Abdomen: benighn, BS positve, no tenderness, no AAA no bruit.  No HSM or HJR Distal pulses intact with no bruits No edema Neuro non-focal Skin warm and dry No muscular weakness   Current Outpatient Prescriptions  Medication Sig Dispense Refill  . apixaban (ELIQUIS) 5 MG TABS tablet Take 5 mg by mouth 2 (two) times daily.    . B Complex Vitamins (VITAMIN-B COMPLEX PO) Take 1 tablet by mouth daily.      . carvedilol (COREG) 6.25 MG tablet Take 1 tablet (6.25 mg total) by  mouth 2 (two) times daily. 180 tablet 3  . cholecalciferol (VITAMIN D) 1000 UNITS tablet Take 2,000 Units by mouth daily.     . DULoxetine (CYMBALTA) 30 MG capsule TAKE 2 CAPSULES (60 MG TOTAL) BY MOUTH DAILY. (Patient taking differently: TAKE 30 mg twice a day.) 60 capsule 5  . glipiZIDE (GLUCOTROL) 10 MG tablet Take 1 tablet (10 mg total) by mouth daily before breakfast. 90 tablet 3  . glucose blood test strip 1 each by Other route as needed. Use as instructed check blood sugars once daily     . Insulin Pen Needle 29G X 12.7MM MISC Use as directed 100 each 5  . LANTUS SOLOSTAR 100 UNIT/ML Solostar Pen INJECT 65 UNITS INTO THE SKIN AT BEDTIME. 15 pen 5  . levothyroxine (SYNTHROID, LEVOTHROID) 112 MCG tablet Take 1 tablet (112 mcg total) by mouth daily. 30 tablet 0  . metFORMIN (GLUCOPHAGE-XR) 500 MG 24 hr tablet TAKE 2 TABLETS (1,000 MG TOTAL) BY MOUTH DAILY WITH BREAKFAST. 60 tablet 3  . methocarbamol (ROBAXIN) 500 MG tablet Take 1 tablet (500 mg total) by mouth every 8 (eight) hours as needed for muscle spasms. 90 tablet 1  . quinapril (ACCUPRIL) 40 MG tablet Take 1 tablet (40 mg total) by mouth daily. (Patient taking differently: Take 40 mg by mouth at bedtime. ) 90 tablet 3  . ranitidine (ZANTAC) 150 MG tablet Take 1 tablet (150 mg total) by mouth 2 (two) times daily. 60 tablet 2  .  vitamin B-12 (CYANOCOBALAMIN) 100 MCG tablet Take 50 mcg by mouth daily.       No current facility-administered medications for this visit.    Allergies  Naproxen sodium; Statins; Sulfonamide derivatives; Latex; and Shellfish allergy  Electrocardiogram:   05/27/13  SR LAFB  Poor R wave progression  06/30/14  ST rate 109  Poor R wave progression increased HR since previous   Assessment and Plan HTN:  Continue coreg and accupril  Carotid:  Stable 60-79% RICA stenosis.  F/U carotid duplex in 6 months  DM:  Discussed low carb diet.  Target hemoglobin A1c is 6.5 or less.  Continue current medications. MS:  Seems  to have some manic tendencyies with depression f/u with primary Currently on cymbalta   [employer]

## 2015-03-16 ENCOUNTER — Telehealth: Payer: Self-pay

## 2015-03-16 ENCOUNTER — Telehealth: Payer: Self-pay | Admitting: Family Medicine

## 2015-03-16 ENCOUNTER — Other Ambulatory Visit: Payer: Self-pay | Admitting: Family Medicine

## 2015-03-16 LAB — URINE CULTURE: Colony Count: >=100000

## 2015-03-16 MED ORDER — CIPROFLOXACIN HCL 250 MG PO TABS: 250.0000 mg | ORAL_TABLET | Freq: Two times a day (BID) | ORAL | Status: DC

## 2015-03-16 NOTE — Telephone Encounter (Signed)
Call pt urine culture is positive for Ecoli.. I have sent in rx for cipro twice daily x 3 days. We will call when sensitivities return if she needs to change antibiotics. If not improving, call.

## 2015-03-16 NOTE — Telephone Encounter (Signed)
Patient advised.

## 2015-03-16 NOTE — Telephone Encounter (Signed)
Pt left v/m requesting cb when get urine culture back. Pt wants cb today; advised pt culture not back yet and will get cb when get results. Pt voiced understanding.

## 2015-03-20 ENCOUNTER — Encounter: Payer: Medicare Other | Admitting: Cardiovascular Disease

## 2015-03-21 ENCOUNTER — Telehealth: Payer: Self-pay

## 2015-03-21 NOTE — Telephone Encounter (Signed)
Pt was seen 03/09/15 for UTI and on 03/16/15 Cipro was sent to pharmacy;pt has finished abx but pt still having problems; lower back pain, pain upon urination still, no fever and no frequency and request refill Cipro to [facility]. Pt request cb. Pt is aware Dr Diona Browner is out of office until 03/23/15 but pt request cb on 03/22/15.

## 2015-03-22 MED ORDER — CIPROFLOXACIN HCL 250 MG PO TABS: 250.0000 mg | ORAL_TABLET | Freq: Two times a day (BID) | ORAL | Status: DC

## 2015-03-22 NOTE — Telephone Encounter (Signed)
Okay to refill antibiotics x 1

## 2015-03-22 NOTE — Telephone Encounter (Signed)
Jaclyn [name] notified that a [facility]ill on [facility] [place].  Advised if she continues to have problems after completing the additional 3 day course, to call and let us know.  Patient states understanding.

## 2015-03-23 ENCOUNTER — Other Ambulatory Visit: Payer: Self-pay | Admitting: *Deleted

## 2015-03-23 ENCOUNTER — Other Ambulatory Visit: Payer: Self-pay | Admitting: Family Medicine

## 2015-03-23 MED ORDER — APIXABAN 5 MG PO TABS: 5.0000 mg | ORAL_TABLET | Freq: Two times a day (BID) | ORAL | Status: DC

## 2015-03-27 ENCOUNTER — Ambulatory Visit: Payer: Medicare Other | Admitting: Family Medicine

## 2015-04-05 ENCOUNTER — Other Ambulatory Visit: Payer: Self-pay | Admitting: Family Medicine

## 2015-04-10 ENCOUNTER — Encounter: Payer: Self-pay | Admitting: Family Medicine

## 2015-04-10 ENCOUNTER — Ambulatory Visit (INDEPENDENT_AMBULATORY_CARE_PROVIDER_SITE_OTHER): Payer: Medicare Other | Admitting: Family Medicine

## 2015-04-10 VITALS — BP 120/70 | HR 92 | Temp 98.8°F | Ht 62.5 in | Wt 157.5 lb

## 2015-04-10 DIAGNOSIS — G459 Transient cerebral ischemic attack, unspecified: Secondary | ICD-10-CM

## 2015-04-10 DIAGNOSIS — Z87440 Personal history of urinary (tract) infections: Secondary | ICD-10-CM

## 2015-04-10 DIAGNOSIS — E785 Hyperlipidemia, unspecified: Secondary | ICD-10-CM

## 2015-04-10 DIAGNOSIS — N3000 Acute cystitis without hematuria: Secondary | ICD-10-CM

## 2015-04-10 DIAGNOSIS — N390 Urinary tract infection, site not specified: Secondary | ICD-10-CM | POA: Insufficient documentation

## 2015-04-10 DIAGNOSIS — E1165 Type 2 diabetes mellitus with hyperglycemia: Secondary | ICD-10-CM | POA: Diagnosis not present

## 2015-04-10 DIAGNOSIS — I10 Essential (primary) hypertension: Secondary | ICD-10-CM | POA: Diagnosis not present

## 2015-04-10 DIAGNOSIS — M797 Fibromyalgia: Secondary | ICD-10-CM

## 2015-04-10 MED ORDER — COLESEVELAM HCL 625 MG PO TABS: 1875.0000 mg | ORAL_TABLET | Freq: Two times a day (BID) | ORAL | Status: DC

## 2015-04-10 NOTE — Assessment & Plan Note (Signed)
Has upcoming OV with neurology for further recommendations. Needs better DM and chol control.

## 2015-04-10 NOTE — Assessment & Plan Note (Signed)
Not at goal at recheck.. Start welchol. Hx of SE to statins.

## 2015-04-10 NOTE — Assessment & Plan Note (Signed)
FBS now much improved with increase in insulin and taking metformin at night. Metformin not to be increased given age. Encouraged exercise, weight loss, healthy eating habits, low carb diet. Follow up in 3 months if A1C remains high.. Consider adding januvia etc.

## 2015-04-10 NOTE — Assessment & Plan Note (Addendum)
Stiull with intermittant dysuria.. Pt unable to obtain UA today. Will return with sample for re-eval.

## 2015-04-10 NOTE — Assessment & Plan Note (Signed)
No longer orthostatic and lightheaded off quinipril. Pt feels better overall. May be able to restart but at low dose as it is benificial for DM as well as in CAD. Will leave up to cardiology to determine at upcoming follow up in 2 weeks.

## 2015-04-10 NOTE — Patient Instructions (Addendum)
Follow fasting blood sugars daily ( goal < 120), try to occ check 2 hours after meals (goal < 180). Call if blood sugars going back up.  Work on low [employer]. Try healthy snack at bedtime, peanut butter and celery or a cheese st in 04/2015. Work on regular exercise , 3 times a week.  Okay to stay off quinapril for now, discuss with Dr.Nishan at upcoming [place].  Start welchol trial to lower cholesterol.  Return with urine to check UA if symptoms continuing.

## 2015-04-10 NOTE — Progress Notes (Signed)
Subjective:    Patient ID: Jaclyn [name], female    DOB: Jun 06, 1935, 80 y.o.   MRN: [id number]  HPI  80 year old female with recent TIA , afib on eliquis, carotid stenosis presents for 2 week follow up on HTN and diabetes.  Has appt  With neuro  2/27.   At last OV BP was found to be low and Pt with intermittent dizziness. Today BP better controlled. She stopped her quinipril She feels like she is not as woozy. 127/61  at home.  Has appt with [name], 3/13. BP Readings from Last 3 Encounters:  04/10/15 120/70  03/09/15 102/60  11/24/14 132/60    DM control was poor and Long-acting insulin was increased to 75 Units daily.  She reports CBGs are improved with increase in insulin and taking metformin at night. FBS were 200, now FBS: 112-128  and 2 hour postprandials: not checking  Does wake up and have peanut butter crackers at 3-4 AM.  Pt was treated with cipro  X 3 days for uncomplicated Ecoli UTI pansensitive on 1/27 Symptoms of dysuria  And low back pain returned.. So pt was again treated with additional 3 day course of cipro on 03/22/2015  today she reports she feels okay for a while then symptoms return. No fever.  No hematuria.  Hx of recurrent UTI. 2 years ago she was on trimethoprim per GYN for UTI prophylaxis.  Elevated Cholesterol: LDL not at goal with new TIA LDL < 70 and carotid stenosis.  Pt needs to start  medication to lower to goal. Lab Results  Component Value Date   CHOL 193 01/26/2015   HDL 35* 01/26/2015   LDLCALC 114 01/26/2015   LDLDIRECT 133.0 11/09/2014   TRIG 218* 01/26/2015   CHOLHDL 5.5* 01/26/2015  Using medications without problems: In past SE to statin muscle. Diet compliance: moderate Exercise: minimal Other complaints:   Fibromyalgia: Much improved on cymbalta 60 mg daily.  Review of Systems  Constitutional: Negative for fever and fatigue.  HENT: Negative for ear pain.   Eyes: Negative for pain.  Respiratory: Negative for chest  tightness and shortness of breath.   Cardiovascular: Negative for chest pain, palpitations and leg swelling.  Gastrointestinal: Negative for abdominal pain.  Genitourinary: Negative for dysuria.       Objective:   Physical Exam  Constitutional: Vital signs are normal. She appears well-developed and well-nourished. She is cooperative.  Non-toxic appearance. She does not appear ill. No distress.  HENT:  Head: Normocephalic.  Right Ear: Hearing, tympanic membrane, external ear and ear canal normal. Tympanic membrane is not erythematous, not retracted and not bulging.  Left Ear: Hearing, tympanic membrane, external ear and ear canal normal. Tympanic membrane is not erythematous, not retracted and not bulging.  Nose: No mucosal edema or rhinorrhea. Right sinus exhibits no maxillary sinus tenderness and no frontal sinus tenderness. Left sinus exhibits no maxillary sinus tenderness and no frontal sinus tenderness.  Mouth/Throat: Uvula is midline, oropharynx is clear and moist and mucous membranes are normal.  Eyes: Conjunctivae, EOM and lids are normal. Pupils are equal, round, and reactive to light. Lids are everted and swept, no foreign bodies found.  Neck: Trachea normal and normal range of motion. Neck supple. Carotid bruit is not present. No thyroid mass and no thyromegaly present.  Cardiovascular: Normal rate, regular rhythm, S1 normal, S2 normal, normal heart sounds, intact distal pulses and normal pulses.  Exam reveals no gallop and no friction rub.   No  murmur heard. Pulmonary/Chest: Effort normal and breath sounds normal. No tachypnea. No respiratory distress. She has no decreased breath sounds. She has no wheezes. She has no rhonchi. She has no rales.  Abdominal: Soft. Normal appearance and bowel sounds are normal. There is no tenderness.  Neurological: She is alert.  Skin: Skin is warm, dry and intact. No rash noted.  Psychiatric: Her speech is normal and behavior is normal. Judgment and  thought content normal. Her mood appears not anxious. Cognition and memory are normal. She does not exhibit a depressed mood.   Diabetic foot exam: Normal inspection No skin breakdown No calluses  Normal DP pulses Normal sensation to light touch and monofilament Nails normal         Assessment & Plan:

## 2015-04-10 NOTE — Assessment & Plan Note (Signed)
Much improved overall on cymbalta.

## 2015-04-10 NOTE — Assessment & Plan Note (Signed)
May need to restart prophylactic trimethoprim if UTI continues to occur. She is not sure why she stopped this in past but she feels it worked to stop her recurrent UTIs.

## 2015-04-10 NOTE — Progress Notes (Signed)
Pre visit review using our clinic review tool, if applicable. No additional management support is needed unless otherwise documented below in the visit note. 

## 2015-04-11 DIAGNOSIS — Z87440 Personal history of urinary (tract) infections: Secondary | ICD-10-CM | POA: Diagnosis not present

## 2015-04-11 DIAGNOSIS — N3000 Acute cystitis without hematuria: Secondary | ICD-10-CM | POA: Diagnosis not present

## 2015-04-11 LAB — POC URINALSYSI DIPSTICK (AUTOMATED)
BILIRUBIN UA: NEGATIVE
KETONES UA: NEGATIVE
NITRITE UA: POSITIVE
SPEC GRAV UA: 1.02
UROBILINOGEN UA: 0.2
pH, UA: 6

## 2015-04-11 NOTE — Addendum Note (Signed)
Addended by: Carter Kitten on: 04/11/2015 05:34 PM   Modules accepted: Orders

## 2015-04-12 ENCOUNTER — Other Ambulatory Visit: Payer: Self-pay | Admitting: Family Medicine

## 2015-04-12 ENCOUNTER — Encounter: Payer: Self-pay | Admitting: Family Medicine

## 2015-04-12 ENCOUNTER — Ambulatory Visit (INDEPENDENT_AMBULATORY_CARE_PROVIDER_SITE_OTHER): Payer: Medicare Other | Admitting: Family Medicine

## 2015-04-12 VITALS — BP 142/62 | HR 106 | Temp 98.1°F | Wt 154.5 lb

## 2015-04-12 DIAGNOSIS — N3000 Acute cystitis without hematuria: Secondary | ICD-10-CM

## 2015-04-12 DIAGNOSIS — M797 Fibromyalgia: Secondary | ICD-10-CM | POA: Diagnosis not present

## 2015-04-12 DIAGNOSIS — E1165 Type 2 diabetes mellitus with hyperglycemia: Secondary | ICD-10-CM

## 2015-04-12 DIAGNOSIS — Z87440 Personal history of urinary (tract) infections: Secondary | ICD-10-CM

## 2015-04-12 DIAGNOSIS — H9190 Unspecified hearing loss, unspecified ear: Secondary | ICD-10-CM | POA: Insufficient documentation

## 2015-04-12 DIAGNOSIS — H9193 Unspecified hearing loss, bilateral: Secondary | ICD-10-CM

## 2015-04-12 MED ORDER — CIPROFLOXACIN HCL 500 MG PO TABS: 500.0000 mg | ORAL_TABLET | Freq: Two times a day (BID) | ORAL | Status: DC

## 2015-04-12 MED ORDER — CEFTRIAXONE SODIUM 1 G IJ SOLR
1.0000 g | Freq: Once | INTRAMUSCULAR | Status: AC
  Administered 2015-04-12: 1 g via INTRAMUSCULAR

## 2015-04-12 NOTE — Assessment & Plan Note (Signed)
Abnormal UA yesterday, with recent vomiting and malaise concern for ascending urinary tract infection - treat with rocephin 1gm IM today then cpiro 500mg  BID 7d course. Last Cr 0.77 (01/2015). Discussed importance of good hydration status with plenty of water, discussed red flags to seek care over weekend - pt and son express understanding.

## 2015-04-12 NOTE — Addendum Note (Signed)
Addended by: Josetta Huddle on: 04/12/2015 07:13 PM   Modules accepted: Orders

## 2015-04-12 NOTE — Progress Notes (Signed)
BP 142/62 mmHg  Pulse 106  Temp(Src) 98.1 F (36.7 C) (Oral)  Wt 154 lb 8 oz (70.081 kg)  SpO2 95%   CC: "i'm pretty sure I have a bladder infection"  Subjective:    Patient ID: Jaclyn [name], female    DOB: 04/27/1935, 80 y.o.   MRN: [id number]  HPI: Jaclyn [name] is a 80 y.o. female presenting on 04/12/2015 for Vomiting, body aches   Patient of Dr Diona Browner new to me present with 2d h/o vomiting, body "soreness" affecting ability to walk. Legs shaking. No dysuria or frequency but endorses vaginal pain and marked urinary urgency. + nausea and vomiting x2 over last 24 hours.  No fevers/chills, dyspnea, coughing, ST, chest pain, unilateral weakness or numbness, worsening confusion, unilateral facial weakness. No rash. No diarrhea.  Saw PCP on Tuesday and note reviewed - recent TIA, afib on eliquis, uncontrolled diabetes last A1c 8.6% taking 75u lantus metformin and glipizide daily. Has recently been treated x2 with 3d cipro course for UTI. Latest UA (yesterday) showing pos nitrites, small LE, 3+ glu, small blood, pending UCx. Fibromyalgia stable on cymbalta 60mg  daily. Statin intolerance. Has not yet started wellchol.  Prior on trimethoprim daily by GYN - this helped symptoms.   Relevant past medical, surgical, family and social history reviewed and updated as indicated. Interim medical history since our last visit reviewed. Allergies and medications reviewed and updated. Current Outpatient Prescriptions on File Prior to Visit  Medication Sig  . apixaban (ELIQUIS) 5 MG TABS tablet Take 1 tablet (5 mg total) by mouth 2 (two) times daily.  . B Complex Vitamins (VITAMIN-B COMPLEX PO) Take 1 tablet by mouth daily.    . carvedilol (COREG) 6.25 MG tablet Take 1 tablet (6.25 mg total) by mouth 2 (two) times daily.  . cholecalciferol (VITAMIN D) 1000 UNITS tablet Take 2,000 Units by mouth daily.   . colesevelam (WELCHOL) 625 MG tablet Take 3 tablets (1,875 mg total) by mouth 2 (two) times  daily with a meal.  . DULoxetine (CYMBALTA) 30 MG capsule TAKE 2 CAPSULES (60 MG TOTAL) BY MOUTH DAILY. (Patient taking differently: TAKE 30 mg twice a day.)  . glipiZIDE (GLUCOTROL) 10 MG tablet TAKE 1 TABLET (10 MG TOTAL) BY MOUTH DAILY BEFORE BREAKFAST.  Marland Kitchen glucose blood test strip 1 each by Other route as needed. Use as instructed check blood sugars once daily   . Insulin Pen Needle 29G X 12.7MM MISC Use as directed  . LANTUS SOLOSTAR 100 UNIT/ML Solostar Pen INJECT 65 UNITS INTO THE SKIN AT BEDTIME.  Marland Kitchen levothyroxine (SYNTHROID, LEVOTHROID) 112 MCG tablet TAKE 1 TABLET (112 MCG TOTAL) BY MOUTH DAILY.  . metFORMIN (GLUCOPHAGE-XR) 500 MG 24 hr tablet TAKE TWO TABLETS BY MOUTH ONCE DAILY WITH BREAKFAST  . methocarbamol (ROBAXIN) 500 MG tablet Take 1 tablet (500 mg total) by mouth every 8 (eight) hours as needed for muscle spasms.  . quinapril (ACCUPRIL) 40 MG tablet Take 1 tablet (40 mg total) by mouth daily. (Patient taking differently: Take 40 mg by mouth at bedtime. )  . ranitidine (ZANTAC) 150 MG tablet Take 1 tablet (150 mg total) by mouth 2 (two) times daily.  . vitamin B-12 (CYANOCOBALAMIN) 100 MCG tablet Take 50 mcg by mouth daily.     No current facility-administered medications on file prior to visit.    Review of Systems Per HPI unless specifically indicated in ROS section     Objective:    BP 142/62 mmHg  Pulse 106  Temp(Src)  98.1 F (36.7 C) (Oral)  Wt 154 lb 8 oz (70.081 kg)  SpO2 95%  Wt Readings from Last 3 Encounters:  04/12/15 154 lb 8 oz (70.081 kg)  04/10/15 157 lb 8 oz (71.442 kg)  03/09/15 156 lb 8 oz (70.988 kg)    Physical Exam  Constitutional: She appears well-developed and well-nourished. No distress.  MARKEDLY hard of hearing  HENT:  Mouth/Throat: Oropharynx is clear and moist. No oropharyngeal exudate.  Cardiovascular: Normal rate, regular rhythm, normal heart sounds and intact distal pulses.   No murmur heard. Pulmonary/Chest: Effort normal and  breath sounds normal. No respiratory distress. She has no wheezes. She has no rales.  Abdominal: Soft. Normal appearance and bowel sounds are normal. She exhibits no distension and no mass. There is no hepatosplenomegaly. There is tenderness in the suprapubic area. There is no rigidity, no rebound, no guarding, no CVA tenderness and negative Murphy's sign.  Musculoskeletal: She exhibits no edema.  Psychiatric: Her affect is inappropriate.  Nursing note and vitals reviewed.  Results for orders placed or performed in visit on 04/10/15  POCT Urinalysis Dipstick (Automated)  Result Value Ref Range   Color, UA yellow    Clarity, UA cloudy    Glucose, UA 3+    Bilirubin, UA negative    Ketones, UA negative    Spec Grav, UA 1.020    Blood, UA small    pH, UA 6.0    Protein, UA trace    Urobilinogen, UA 0.2    Nitrite, UA positive    Leukocytes, UA small (1+) (A) Negative   Lab Results  Component Value Date   CREATININE 0.77 01/26/2015       Assessment & Plan:   Problem List Items Addressed This Visit    UTI (urinary tract infection) - Primary    Abnormal UA yesterday, with recent vomiting and malaise concern for ascending urinary tract infection - treat with rocephin 1gm IM today then cpiro 500mg  BID 7d course. Last Cr 0.77 (01/2015). Discussed importance of good hydration status with plenty of water, discussed red flags to seek care over weekend - pt and son express understanding.      Poorly controlled diabetes mellitus (Ord)   History of recurrent UTIs    3rd UTI in last 2 months, relapse vs recurrence. Will defer to PCP whether to start TMP ppx.      Hard of hearing    Encouraged she pursue hearing aides.      Fibromyalgia       Follow up plan: Return if symptoms worsen or fail to improve.

## 2015-04-12 NOTE — Assessment & Plan Note (Signed)
Encouraged she pursue hearing aides.

## 2015-04-12 NOTE — Progress Notes (Signed)
Pre visit review using our clinic review tool, if applicable. No additional management support is needed unless otherwise documented below in the visit note. 

## 2015-04-12 NOTE — Patient Instructions (Addendum)
I'm worried about bladder and kidney infection - treat with ceftriaxone shot today then start 7 d cipro antibiotic sent to pharmacy. Please let us know if not improving with treatment. We will be in touch with culture results.  Urinary Tract Infection Urinary tract infections (UTIs) can develop anywhere along your urinary tract. Your urinary tract is your body's drainage system for removing wastes and extra water. Your urinary tract includes two kidneys, two ureters, a bladder, and a urethra. Your kidneys are a pair of bean-shaped organs. Each kidney is about the size of your fist. They are located below your ribs, one on each side of your spine. CAUSES Infections are caused by microbes, which are microscopic organisms, including fungi, viruses, and bacteria. These organisms are so small that they can only be seen through a microscope. Bacteria are the microbes that most commonly cause UTIs. SYMPTOMS  Symptoms of UTIs may vary by age and gender of the patient and by the location of the infection. Symptoms in young women typically include a frequent and intense urge to urinate and a painful, burning feeling in the bladder or urethra during urination. Older women and men are more likely to be tired, shaky, and weak and have muscle aches and abdominal pain. A fever may mean the infection is in your kidneys. Other symptoms of a kidney infection include pain in your back or sides below the ribs, nausea, and vomiting. DIAGNOSIS To diagnose a UTI, your caregiver will ask you about your symptoms. Your caregiver will also ask you to provide a urine sample. The urine sample will be tested for bacteria and white blood cells. White blood cells are made by your body to help fight infection. TREATMENT  Typically, UTIs can be treated with medication. Because most UTIs are caused by a bacterial infection, they usually can be treated with the use of antibiotics. The choice of antibiotic and length of treatment depend on  your symptoms and the type of bacteria causing your infection. HOME CARE INSTRUCTIONS  If you were prescribed antibiotics, take them exactly as your caregiver instructs you. Finish the medication even if you feel better after you have only taken some of the medication.  Drink enough water and fluids to keep your urine clear or pale yellow.  Avoid caffeine, tea, and carbonated beverages. They tend to irritate your bladder.  Empty your bladder often. Avoid holding urine for long periods of time.  Empty your bladder before and after sexual intercourse.  After a bowel movement, women should cleanse from front to back. Use each tissue only once. SEEK MEDICAL CARE IF:   You have back pain.  You develop a fever.  Your symptoms do not begin to resolve within 3 days. SEEK IMMEDIATE MEDICAL CARE IF:   You have severe back pain or lower abdominal pain.  You develop chills.  You have nausea or vomiting.  You have continued burning or discomfort with urination. MAKE SURE YOU:   Understand these instructions.  Will watch your condition.  Will get help right away if you are not doing well or get worse.   This information is not intended to replace advice given to you by your health care provider. Make sure you discuss any questions you have with your health care provider.   Document Released: 11/13/2004 Document Revised: 10/25/2014 Document Reviewed: 03/14/2011 Elsevier Interactive Patient Education [employer].

## 2015-04-12 NOTE — Assessment & Plan Note (Addendum)
3rd UTI in last 2 months, relapse vs recurrence. Will defer to PCP whether to start TMP ppx.

## 2015-04-14 LAB — URINE CULTURE: Colony Count: >=100000

## 2015-04-16 ENCOUNTER — Ambulatory Visit: Payer: Medicare Other | Admitting: Neurology

## 2015-04-18 ENCOUNTER — Other Ambulatory Visit: Payer: Self-pay | Admitting: Family Medicine

## 2015-04-18 NOTE — Telephone Encounter (Signed)
Pt request status of refills for duloxetine; advised pt done and she will ck with pharmacy.

## 2015-04-24 NOTE — Progress Notes (Signed)
Patient ID: Jaclyn [name], female   DOB: October 03, 1935, 80 y.o.   MRN: [id number]   Jaclyn [name] is seen today fo rf/U of HTN, elevated lipids previous atypical SSCP and right carotid bruit. She's had a normal cath in 2008. She has known RICA dx. I reviewed her duplex from 10/2014   and she has stable 60-79% RICA stenosis (Q000111Q), and 123456 LICA stenosis. She has had no recurrent SSCP, no TIA like symptoms. She has been compliant with her meds. She is intolerant to statins. She needs to have her cholesterol checked at Dr. [name]. Othewise she is doing well Some fibromyalgia and neuropatthy. No history of PVC or claudication.   Still with somewhat poorly controlled DM  A1c in 8 range  Poor diet  Lantus recently increased by Dr [name]   Carotid disease with bruits  Lots of pain from fibromyalgia Thinks toprol is contributing to depression  Last visit BP up and changed to coreg and increased accupril   Hospitalized in [place] September 2016 ? TIA;s Do not have records. She was having intermittent aphasia.  Not clear if any PAF documented But she was placed on eliquis  She has never had PAF with [name].  She denies any palpitations    Also having issues with UTI's since hospital d/c   ROS: Denies fever, malais, weight loss, blurry vision, decreased visual acuity, cough, sputum, SOB, hemoptysis, pleuritic pain, palpitaitons, heartburn, abdominal pain, melena, lower extremity edema, claudication, or rash.  All other systems reviewed and negative  General: Affect appropriate Healthy:  appears stated age 30: normal Neck supple with no adenopathy JVP normal right bruits no thyromegaly Lungs clear with no wheezing and good diaphragmatic motion Heart:  S1/S2 no murmur, no rub, gallop or click PMI normal Abdomen: benighn, BS positve, no tenderness, no AAA no bruit.  No HSM or HJR Distal pulses intact with no bruits No edema Neuro non-focal Skin warm and dry No muscular weakness   Current Outpatient  Prescriptions  Medication Sig Dispense Refill  . apixaban (ELIQUIS) 5 MG TABS tablet Take 1 tablet (5 mg total) by mouth 2 (two) times daily. 60 tablet 2  . B Complex Vitamins (VITAMIN-B COMPLEX PO) Take 1 tablet by mouth daily.      . carvedilol (COREG) 6.25 MG tablet Take 1 tablet (6.25 mg total) by mouth 2 (two) times daily. 180 tablet 3  . cholecalciferol (VITAMIN D) 1000 UNITS tablet Take 2,000 Units by mouth daily.     . ciprofloxacin (CIPRO) 500 MG tablet Take 500 mg by mouth 2 (two) times daily.    . colesevelam (WELCHOL) 625 MG tablet Take 3 tablets (1,875 mg total) by mouth 2 (two) times daily with a meal. 180 tablet 11  . DULoxetine (CYMBALTA) 30 MG capsule TAKE TWO CAPSULES BY MOUTH ONCE DAILY 60 capsule 5  . glipiZIDE (GLUCOTROL) 10 MG tablet TAKE 1 TABLET (10 MG TOTAL) BY MOUTH DAILY BEFORE BREAKFAST. 90 tablet 1  . LANTUS SOLOSTAR 100 UNIT/ML Solostar Pen INJECT 65 UNITS INTO THE SKIN AT BEDTIME. 15 pen 5  . levothyroxine (SYNTHROID, LEVOTHROID) 112 MCG tablet TAKE 1 TABLET (112 MCG TOTAL) BY MOUTH DAILY. 30 tablet 11  . metFORMIN (GLUCOPHAGE-XR) 500 MG 24 hr tablet TAKE TWO TABLETS BY MOUTH ONCE DAILY WITH BREAKFAST 60 tablet 5  . methocarbamol (ROBAXIN) 500 MG tablet Take 1 tablet (500 mg total) by mouth every 8 (eight) hours as needed for muscle spasms. 90 tablet 1  . quinapril (ACCUPRIL) 40 MG tablet Take  1 tablet (40 mg total) by mouth daily. 90 tablet 3  . ranitidine (ZANTAC) 150 MG tablet Take 1 tablet (150 mg total) by mouth 2 (two) times daily. 60 tablet 2  . trimethoprim (TRIMPEX) 100 MG tablet Take 1 tablet (100 mg total) by mouth daily. For UTI prophylaxsis 30 tablet 11  . vitamin B-12 (CYANOCOBALAMIN) 100 MCG tablet Take 50 mcg by mouth daily.       No current facility-administered medications for this visit.    Allergies  Naproxen sodium; Statins; Sulfonamide derivatives; Latex; and Shellfish allergy  Electrocardiogram:   05/27/13  SR LAFB  Poor R wave  progression  06/30/14  ST rate 109  Poor R wave progression increased HR since previous   Assessment and Plan HTN:  Well controlled.  Continue current medications and low sodium Dash type diet.   Carotid:  10/2014 duplex with  50-69% RICA read by [facility]  DM:  Discussed low carb diet.  Target hemoglobin A1c is 6.5 or less.  Continue current medications. MS:  Seems to have some manic tendencyies with depression f/u with primary Currently on cymbalta TIA:  Will get records from [place]. Preliminary refer to EP for possible loop recorder Continue eliquis For now as she has not had any episodes of speech difficulty since hospital d/c UTI:  F/u primary s/p multiple Rx antibiotics   Jenkins Rouge

## 2015-04-26 ENCOUNTER — Encounter: Payer: Self-pay | Admitting: Family Medicine

## 2015-04-26 ENCOUNTER — Ambulatory Visit (INDEPENDENT_AMBULATORY_CARE_PROVIDER_SITE_OTHER): Payer: Medicare Other | Admitting: Family Medicine

## 2015-04-26 VITALS — BP 150/97 | HR 77 | Temp 98.3°F | Ht 62.5 in | Wt 159.0 lb

## 2015-04-26 DIAGNOSIS — B3731 Acute candidiasis of vulva and vagina: Secondary | ICD-10-CM

## 2015-04-26 DIAGNOSIS — Z87440 Personal history of urinary (tract) infections: Secondary | ICD-10-CM | POA: Diagnosis not present

## 2015-04-26 DIAGNOSIS — B373 Candidiasis of vulva and vagina: Secondary | ICD-10-CM

## 2015-04-26 DIAGNOSIS — B372 Candidiasis of skin and nail: Secondary | ICD-10-CM | POA: Insufficient documentation

## 2015-04-26 DIAGNOSIS — N3000 Acute cystitis without hematuria: Secondary | ICD-10-CM

## 2015-04-26 MED ORDER — TRIMETHOPRIM 100 MG PO TABS: 100.0000 mg | ORAL_TABLET | Freq: Every day | ORAL | Status: DC

## 2015-04-26 NOTE — Assessment & Plan Note (Signed)
Given frequent UTIs. Restart trimethoprim for prophylaxsis.

## 2015-04-26 NOTE — Patient Instructions (Addendum)
Use OTC  Yeast cream while on antibiotics. Once done if still with yeast vaginal irritation.. Take diflucan x 1 . Bring in urine sample for culture tommorow ASAP.  Start trimethoprim daily for prevention long term. Go to ER if severe symptoms, confusion etc.

## 2015-04-26 NOTE — Progress Notes (Signed)
Pre visit review using our clinic review tool, if applicable. No additional management support is needed unless otherwise documented below in the visit note. 

## 2015-04-26 NOTE — Assessment & Plan Note (Signed)
Use topical cream OTC, if not improving, call for rx for diflucan.

## 2015-04-26 NOTE — Assessment & Plan Note (Signed)
Complete cipro course.  Bring urnie sample in for test of cure.. Given continued symptoms may be continued.

## 2015-04-26 NOTE — Progress Notes (Signed)
   Subjective:    Patient ID: Jaclyn [name], female    DOB: Apr 11, 1935, 80 y.o.   MRN: [id number]  HPI 80 year old female pt presents for follow up UTI, complicated.  She was seen by Dr. Darnell Level on 2/23 with vomiting body aches, urinary urgency, vaginal pain No fever. U culture showed Ecoli pan-sensitive. This was 3rd UTI in 2 months. She was treated with  Rocephin 1g, then cipro 500 mg x 7 days.   She reports that she has some improvement in flu like symptoms, but uncomfortable when urinates, urinary frequency, urinary urgency, occ incontinence.  Vomiting and bodyaches are  Resolved, she felt better overall.  No fever. Now in last 2 days she has had some left  flank pain.  Irritated vaginally, mildly off and on since antibiotics, no vaginal discharge.    unable to leave sample of urine today.   Social History /Family History/Past Medical History reviewed and updated if needed. Hx of recurrent UTI, in past trimethoprim helped prevent UTIs. Review of Systems  Constitutional: Negative for fever and fatigue.  HENT: Negative for ear pain.   Eyes: Negative for pain.  Respiratory: Negative for chest tightness and shortness of breath.   Cardiovascular: Negative for chest pain, palpitations and leg swelling.  Gastrointestinal: Negative for abdominal pain.  Genitourinary: Negative for dysuria.       Objective:   Physical Exam  Constitutional: Vital signs are normal. She appears well-developed and well-nourished. She is cooperative.  Non-toxic appearance. She does not appear ill. No distress.  HENT:  Head: Normocephalic.  Right Ear: Hearing, tympanic membrane, external ear and ear canal normal. Tympanic membrane is not erythematous, not retracted and not bulging.  Left Ear: Hearing, tympanic membrane, external ear and ear canal normal. Tympanic membrane is not erythematous, not retracted and not bulging.  Nose: No mucosal edema or rhinorrhea. Right sinus exhibits no maxillary sinus  tenderness and no frontal sinus tenderness. Left sinus exhibits no maxillary sinus tenderness and no frontal sinus tenderness.  Mouth/Throat: Uvula is midline, oropharynx is clear and moist and mucous membranes are normal.  Eyes: Conjunctivae, EOM and lids are normal. Pupils are equal, round, and reactive to light. Lids are everted and swept, no foreign bodies found.  Neck: Trachea normal and normal range of motion. Neck supple. Carotid bruit is not present. No thyroid mass and no thyromegaly present.  Cardiovascular: Normal rate, regular rhythm, S1 normal, S2 normal, normal heart sounds, intact distal pulses and normal pulses.  Exam reveals no gallop and no friction rub.   No murmur heard. Pulmonary/Chest: Effort normal and breath sounds normal. No tachypnea. No respiratory distress. She has no decreased breath sounds. She has no wheezes. She has no rhonchi. She has no rales.  Abdominal: Soft. Normal appearance and bowel sounds are normal. There is no hepatosplenomegaly. There is tenderness in the periumbilical area. There is no rebound, no guarding and no CVA tenderness.  Neurological: She is alert.  Skin: Skin is warm, dry and intact. No rash noted.  Psychiatric: Her speech is normal and behavior is normal. Judgment and thought content normal. Her mood appears not anxious. Cognition and memory are normal. She does not exhibit a depressed mood.          Assessment & Plan:

## 2015-04-30 ENCOUNTER — Encounter: Payer: Self-pay | Admitting: Cardiovascular Disease

## 2015-04-30 ENCOUNTER — Ambulatory Visit (INDEPENDENT_AMBULATORY_CARE_PROVIDER_SITE_OTHER): Payer: Medicare Other | Admitting: Cardiovascular Disease

## 2015-04-30 VITALS — BP 150/72 | HR 75 | Ht 62.0 in | Wt 158.8 lb

## 2015-04-30 DIAGNOSIS — I4891 Unspecified atrial fibrillation: Secondary | ICD-10-CM

## 2015-04-30 NOTE — Patient Instructions (Signed)
Medication Instructions:  Your physician recommends that you continue on your current medications as directed. Please refer to the Current Medication list given to you today.  Labwork: NONE  Testing/Procedures: NONE  Follow-Up: Your physician wants you to follow-up in: 6 months with [name]. You will receive a reminder letter in the mail two months in advance. If you don't receive a letter, please call our office to schedule the follow-up appointment.  Your physician recommends that you schedule a follow-up appointment with EP for LOOP recorder for PAF.   If you need a refill on your cardiac medications before your next appointment, please call your pharmacy.

## 2015-05-07 ENCOUNTER — Ambulatory Visit (INDEPENDENT_AMBULATORY_CARE_PROVIDER_SITE_OTHER): Payer: Medicare Other | Admitting: Neurology

## 2015-05-07 ENCOUNTER — Other Ambulatory Visit: Payer: Self-pay | Admitting: *Deleted

## 2015-05-07 ENCOUNTER — Encounter: Payer: Self-pay | Admitting: Neurology

## 2015-05-07 VITALS — BP 140/70 | HR 82 | Ht 62.0 in | Wt 157.2 lb

## 2015-05-07 DIAGNOSIS — G459 Transient cerebral ischemic attack, unspecified: Secondary | ICD-10-CM

## 2015-05-07 DIAGNOSIS — E0842 Diabetes mellitus due to underlying condition with diabetic polyneuropathy: Secondary | ICD-10-CM

## 2015-05-07 DIAGNOSIS — I6523 Occlusion and stenosis of bilateral carotid arteries: Secondary | ICD-10-CM

## 2015-05-07 NOTE — Progress Notes (Signed)
[facility] Clinic Note - Initial Visit   Date: 05/07/2015  Jaclyn [name] MRN: [id number] DOB: 1935-12-01   Dear Dr. Diona Browner:  Thank you for your kind referral of Jaclyn [name] for consultation of TIA. Although her history is well known to you, please allow [name] to reiterate it for the purpose of our medical record. The patient was accompanied to the clinic by son's girlfriend who also provides collateral information.     History of Present Illness:    Patient arrived 20-minutes late to her new patient appointment.   Jaclyn [name] is a 80 y.o. right-handed Caucasian female with diabetes mellitus, hypertension, hypothyroidism, fibromyalgia  presenting for evaluation of TIA.  Patient is not a good historian and has very scattered thought process.  From review of her Epic chart, it appears that patient was hospitalized at [facility] in September 2016 for acute onset of expressive aphasia and gait instability.  Within two hours, her symptoms slowly improved.  She had a stroke work-up which was negative but due to her history of afib and stroke-like symptoms, she was started on Eliquis for TIA.  She has not had any similar spells since this time.  She has known carotid disease, worse on the right (RICA 50-59%), and was taking aspirin intermittently.  She was not on statin therapy due to intolerance of side effects (mylagias). She reports being told 10 years ago that she had atrial fibrillation and took coumadin for two years.  She self-discontinued the medication.    Her gait unsteadiness has been an ongoing issue.  She walks unassisted and denies any falls.    Out-side paper records, electronic medical record, and images have been reviewed where available and summarized as:  MRI brain wo contrast 11/13/2014:  Moderate atrophy and extensive small vessel disease.  No acute intracranial findings.  No specific abnormality seen to explain acute onset of slurred  speech, altered mental status, or LEFT leg weakness.  Echo showed no acute abnormalities.    Carotids showed stable carotid disease (RICA 50-69%).   Lab Results  Component Value Date   CHOL 193 01/26/2015   HDL 35* 01/26/2015   LDLCALC 114 01/26/2015   LDLDIRECT 133.0 11/09/2014   TRIG 218* 01/26/2015   CHOLHDL 5.5* 01/26/2015   Lab Results  Component Value Date   TSH 0.725 01/26/2015   Lab Results  Component Value Date   [id number] 10/08/2012   Lab Results  Component Value Date   HGBA1C 8.6* 01/26/2015    Past Medical History  Diagnosis Date  . Unspecified essential hypertension   . Other chest pain   . Atrial fibrillation ([name])   . Cerebrovascular disease, unspecified   . Other and unspecified hyperlipidemia   . Type II or unspecified type diabetes mellitus without mention of complication, not stated as uncontrolled   . Unspecified hypothyroidism   . Diverticulosis of colon (without mention of hemorrhage)   . Benign neoplasm of colon   . Other specified benign mammary dysplasias   . Urinary tract infection, site not specified   . Osteoarthrosis, unspecified whether generalized or localized, unspecified site   . Myalgia and myositis, unspecified   . Unspecified vitamin D deficiency   . Headache(784.0)   . Dysthymic disorder   . Insomnia, unspecified   . Carotid bruit   . Carotid artery occlusion     60-79% right ICA stenosis  . Fibromyalgia   . Interstitial cystitis   . Difficult intubation 2008    During  surgery to remove large polyp  . TIA (transient ischemic attack)     Past Surgical History  Procedure Laterality Date  . Vesicovaginal fistula closure w/ tah  1990    w/ cystocele &retocele repairs Dr. Ree Edman  . Right colectomy  2005    for villous adenoma of the cecum Dr.streck  . Cardiac catheterization  02/19/06    EF 60%  . Colonoscopy    . Abdominal hysterectomy  1988    cervical dysplasia     Medications:  Outpatient Encounter  Prescriptions as of 05/07/2015  Medication Sig  . apixaban (ELIQUIS) 5 MG TABS tablet Take 1 tablet (5 mg total) by mouth 2 (two) times daily.  . B Complex Vitamins (VITAMIN-B COMPLEX PO) Take 1 tablet by mouth daily.    . carvedilol (COREG) 6.25 MG tablet Take 1 tablet (6.25 mg total) by mouth 2 (two) times daily.  . cholecalciferol (VITAMIN D) 1000 UNITS tablet Take 2,000 Units by mouth daily.   . colesevelam (WELCHOL) 625 MG tablet Take 3 tablets (1,875 mg total) by mouth 2 (two) times daily with a meal.  . DULoxetine (CYMBALTA) 30 MG capsule TAKE TWO CAPSULES BY MOUTH ONCE DAILY  . glipiZIDE (GLUCOTROL) 10 MG tablet TAKE 1 TABLET (10 MG TOTAL) BY MOUTH DAILY BEFORE BREAKFAST.  Marland Kitchen LANTUS SOLOSTAR 100 UNIT/ML Solostar Pen INJECT 65 UNITS INTO THE SKIN AT BEDTIME.  Marland Kitchen levothyroxine (SYNTHROID, LEVOTHROID) 112 MCG tablet TAKE 1 TABLET (112 MCG TOTAL) BY MOUTH DAILY.  . metFORMIN (GLUCOPHAGE-XR) 500 MG 24 hr tablet TAKE TWO TABLETS BY MOUTH ONCE DAILY WITH BREAKFAST  . methocarbamol (ROBAXIN) 500 MG tablet Take 1 tablet (500 mg total) by mouth every 8 (eight) hours as needed for muscle spasms.  . ranitidine (ZANTAC) 150 MG tablet Take 1 tablet (150 mg total) by mouth 2 (two) times daily.  Marland Kitchen trimethoprim (TRIMPEX) 100 MG tablet Take 1 tablet (100 mg total) by mouth daily. For UTI prophylaxsis  . vitamin B-12 (CYANOCOBALAMIN) 100 MCG tablet Take 50 mcg by mouth daily.    . [DISCONTINUED] ciprofloxacin (CIPRO) 500 MG tablet Take 500 mg by mouth 2 (two) times daily.  . [DISCONTINUED] quinapril (ACCUPRIL) 40 MG tablet Take 1 tablet (40 mg total) by mouth daily.   No facility-administered encounter medications on file as of 05/07/2015.     Allergies:  Allergies  Allergen Reactions  . Naproxen Sodium Other (See Comments)    Fever/aches and pains  . Statins     myalgias  . Sulfonamide Derivatives Hives and Itching  . Latex Itching and Rash  . Shellfish Allergy Itching, Swelling and Rash     Seafood, shrimp    Family History: Family History  Problem Relation Age of Onset  . Lymphoma Father   . Hypertension Father   . Stroke Father   . Hyperlipidemia Father   . Hypertension Mother   . Allergies Brother   . Hypertension Sister     3  . Breast cancer Sister   . Fibromyalgia Sister     3  . Hyperlipidemia Sister     3    Social History: Social History  Substance Use Topics  . Smoking status: Never Smoker   . Smokeless tobacco: Never Used  . Alcohol Use: No   Social History   Social History Narrative   1 child died at 90 months old --hx of downs syndrome  Lives with son in a 2 story home.  Uses the 1st floor.  Has two living  children.  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      Review of Systems:  CONSTITUTIONAL: No fevers, chills, night sweats, or weight loss.   EYES: No visual changes or eye pain ENT: No hearing changes.  No history of nose bleeds.   RESPIRATORY: No cough, wheezing and shortness of breath.   CARDIOVASCULAR: Negative for chest pain, and palpitations.   GI: Negative for abdominal discomfort, blood in stools or black stools.  No recent change in bowel habits.   GU:  No history of incontinence.   MUSCLOSKELETAL: No history of joint pain or swelling.  No myalgias.   SKIN: Negative for lesions, rash, and itching.   HEMATOLOGY/ONCOLOGY: Negative for prolonged bleeding, bruising easily, and swollen nodes.  No history of cancer.   ENDOCRINE: Negative for cold or heat intolerance, polydipsia or goiter.   PSYCH:  +depression +anxiety symptoms.   NEURO: As Above.   Vital Signs:  BP 140/70 mmHg  Pulse 82  Ht 5\' 2"  (1.575 m)  Wt 157 lb 3 oz (71.3 kg)  BMI 28.74 kg/m2  SpO2 96%   General Medical Exam:   General:  Overly cheerful, often touches my knee or hand during the interview as she talks.   Eyes/ENT: see cranial nerve examination.   Neck: No masses appreciated.  Full range of motion without tenderness.  Right carotid  bruit. Respiratory:  Clear to auscultation, good air entry bilaterally.   Cardiac:  Regular rate and rhythm, no murmur.   Extremities:  No deformities, edema, or skin discoloration.  Skin:  No rashes or lesions.  Neurological Exam: MENTAL STATUS including orientation to time, place, person intact.  Thought process is tangential and difficult to , intermittent pressured speech.  There is no dysarthria.  She is able to name and repeat well.    CRANIAL NERVES: II:  No visual field defects.  Unremarkable fundi.   III-IV-VI: Pupils equal round and reactive to light.  Normal conjugate, extra-ocular eye movements in all directions of gaze.  No nystagmus.  No ptosis.   V:  Normal facial sensation.     VII:  Normal facial symmetry and movements.  No pathologic facial reflexes.  VIII:  Normal hearing and vestibular function.   IX-X:  Normal palatal movement.   XI:  Normal shoulder shrug and head rotation.   XII:  Normal tongue strength and range of motion, no deviation or fasciculation.  MOTOR:  No atrophy, fasciculations or abnormal movements.  No pronator drift.  Tone is normal.    Right Upper Extremity:    Left Upper Extremity:    Deltoid  5/5   Deltoid  5/5   Biceps  5/5   Biceps  5/5   Triceps  5/5   Triceps  5/5   Wrist extensors  5/5   Wrist extensors  5/5   Wrist flexors  5/5   Wrist flexors  5/5   Finger extensors  5/5   Finger extensors  5/5   Finger flexors  5/5   Finger flexors  5/5   Dorsal interossei  5/5   Dorsal interossei  5/5   Abductor pollicis  5/5   Abductor pollicis  5/5   Tone (Ashworth scale)  0  Tone (Ashworth scale)  0   Right Lower Extremity:    Left Lower Extremity:    Hip flexors  5/5   Hip flexors  5/5   Hip extensors  5/5   Hip extensors  5/5   Knee flexors  5/5  Knee flexors  5/5   Knee extensors  5/5   Knee extensors  5/5   Dorsiflexors  5/5   Dorsiflexors  5/5   Plantarflexors  5/5   Plantarflexors  5/5   Toe extensors  5/5   Toe extensors  5/5   Toe  flexors  5/5   Toe flexors  5/5   Tone (Ashworth scale)  0  Tone (Ashworth scale)  0   MSRs:  Right                                                                 Left brachioradialis 2+  brachioradialis 2+  biceps 2+  biceps 2+  triceps 2+  triceps 2+  patellar 2+  patellar 2+  ankle jerk 1+  ankle jerk 1+  Hoffman no  Hoffman no  plantar response down  plantar response down   SENSORY:  Reduced vibration, temperature, and pin prick distal to ankles bilaterally.  There is mild sway with Rhomberg testing.  COORDINATION/GAIT: Normal finger-to- nose-finger.  Intact rapid alternating movements bilaterally.  Gait is tested unassisted and appears wide-based, sometimes unsteady.   IMPRESSION: 1.  Transient ischemic attack manifesting with expressive aphasia.  - Risk factors:  Uncontrolled diabetes (HbA1c 8.6), hyperlipidemia (LDL 114), hypertension, ?afib  - Per OSH records, work-up included:   - MRI brain did not show acute infarct   - Echo was normal   - US carotids showed stable carotid disease, RICA 0000000 and LICA [id number]   - Uncertain whether her telemetry showed arrhythmia   - She was started on eliquis because of history of afib and symptomology of TIA  - By history, she very well may have had a TIA affecting the LEFT language dominant hemisphere which may be due to cardioembolic disease as her LICA stenosis is not very severe, but it is important to establish whether she indeed has afib to keep her on Eliqus  - I agree with [name] to continue Eliquis for now and to look for afib with implantable loop recorder; if there is no arrhythmia, I would favor managing with plavix 75mg  for plaque stabilization   - Secondary risk prevention discussed - tight glycemic control, diet control for cholesterol as she does not tolerate statins  2.  Distal and symmetric diabetic neuropathy causing paresthesias and sensory ataxia  - With her gait instability, she is fall risk and was made aware of  potential devastating bleed on anticoagulation  - Fall precautions discussed, she is advised to start using a cane  - Start home PT for balance training  3.  Mood disorder with bipolar traits  Return to clinic in 3 months   The duration of this appointment visit was 30 minutes of face-to-face time with the patient.  Greater than 50% of this time was spent in counseling, explanation of diagnosis, planning of further management, and coordination of care.   Thank you for allowing me to participate in patient's care.  If I can answer any additional questions, I would be pleased to do so.    Sincerely,    Deb Loudin K. Posey Pronto, DO

## 2015-05-07 NOTE — Patient Instructions (Signed)
[facility]  Preventing Falls in the Home   Falls are common, often dreaded events in the lives of older people. Aside from the obvious injuries and even death that may result, falls can cause wide-ranging consequences including loss of independence, mental decline, decreased activity, and mobility. Younger people are also at risk of falling, especially those with chronic illnesses and fatigue.  Ways to reduce the risk for falling:  * Examine diet and medications. Warm foods and alcohol dilate blood vessels, which can lead to dizziness when standing. Sleep aids, antidepressants, and pain medications can also increase the likelihood of a fall.  * Get a vison exam. Poor vision, cataracts, and glaucoma increase the chances of falling.  * Check foot gear. Shoes should fit snugly and have a sturdy, nonskid sole and broad, low heel.  * Participate in a physician-approved exercise program to build and maintain muscle strength and improve balance and coordination.  * Increase vitamin D intake. Vitamin D improves muscle strength and increases the amount of calcium the body is able to absorb and deposit in bones.  How to prevent falls from common hazards:  * Floors - Remove all loose wires, cords, and throw rugs. Minimize clutter. Make sure rugs are anchored and smooth. Keep furniture in its usual place.  * Chairs - Use chairs with straight backs, armrests, and firm seats. Add firm cushions to existing pieces to add height.  * Bathroom - Install grab bars and non-skid tape in the tub or shower. Use a bathtub transfer bench or a shower chair with a back support. Use an elevated toilet seat and/or safety rails to assist standing from a low surface. Do not use towel racks or bathroom tissue holders to help you stand.  * Lighting - Make sure halls, stairways, and entrances are well-lit. Install a night light in your bathroom or hallway. Make sure there is a light switch at the top and bottom of the  staircase. Turn lights on if you get up in the middle of the night. Make sure lamps or light switches are within reach of the bed if you have to get up during the night.  * Kitchen - Install non-skid rubber mats near the sink and stove. Clean spills immediately. Store frequently used utensils, pots, and pans between waist and eye level. This helps prevent reaching and bending. Sit when getting things out of the lower cupboards.  * Living room / [place] furniture with wide spaces in between, giving enough room to move around. Establish a route through the living room that gives you something to hold onto as you walk.  * Stairs - Make sure treads, rails, and rugs are secure. Install a rail on both sides of the stairs. If stairs are a threat, it might be helpful to arrange most of your activities on the lower level to reduce the number of times you must climb the stairs.  * Entrances and doorways - Install metal handles on the walls adjacent to the doorknobs of all doors to make it more secure as you travel through the doorway.  Tips for maintaining balance:  * Keep at least one hand free at all times Try using a backpack or fanny pack to hold things rather than carrying them in your hands. Never carry objects in both hands when walking as this interferes with keeping your balance.  * Attempt to swing both arms from front to back while walking. This might require a conscious effort if Parkinson's  disease has diminished your movement. It will, however, help you to maintain balance and posture, and reduce fatigue.  * Consciously lift your feet off the ground when walking. Shuffling and dragging of the feet is a common culprit in losing your balance.  * When trying to navigate turns, use a "U" technique of facing forward and making a wide turn, rather than pivoting sharply.  * Try to stand with your feet shoulder-length apart. When your feet are close together for any length of time, you increase your  risk of losing your balance and falling.  * Do one thing at a time. Do not try to walk and accomplish another task, such as reading or looking around. The decrease in your automatic reflexes complicates motor function, so the less distraction, the better.  * Do not wear rubber or gripping soled shoes, they might "catch" on the floor and cause tripping.  * Move slowly when changing positions. Use deliberate, concentrated movements and, if needed, use a grab bar or walking aid. Count fifteen (15) seconds after standing to begin walking.  * If balance is a continuous problem, you might want to consider a walking aid such as a cane, walking stick, or walker. Once you have mastered walking with help, you may be ready to try it again on your own.  This information is provided by [facility] and is not intended to replace the medical advice of your physician or other health care providers. Please consult your physician or other health care providers for advice regarding your specific medical condition.

## 2015-05-08 ENCOUNTER — Telehealth: Payer: Self-pay

## 2015-05-08 MED ORDER — FLUCONAZOLE 150 MG PO TABS: 150.0000 mg | ORAL_TABLET | Freq: Once | ORAL | Status: DC

## 2015-05-08 NOTE — Telephone Encounter (Signed)
Pt was seen 04/26/15; pt has been using OTC meds for perineal itching and pt was to cb if OTC did not aleviate problem. Pt request diflucan to [facility] [address].

## 2015-05-08 NOTE — Telephone Encounter (Signed)
Rx for diflucan sent in.

## 2015-05-09 NOTE — Telephone Encounter (Signed)
Left message for Jaclyn [name] that the prescription she requested has been sent into [place] [facility]

## 2015-05-21 ENCOUNTER — Telehealth: Payer: Self-pay | Admitting: Neurology

## 2015-05-21 DIAGNOSIS — G4700 Insomnia, unspecified: Secondary | ICD-10-CM | POA: Diagnosis not present

## 2015-05-21 DIAGNOSIS — I10 Essential (primary) hypertension: Secondary | ICD-10-CM | POA: Diagnosis not present

## 2015-05-21 DIAGNOSIS — M797 Fibromyalgia: Secondary | ICD-10-CM | POA: Diagnosis not present

## 2015-05-21 DIAGNOSIS — Z794 Long term (current) use of insulin: Secondary | ICD-10-CM | POA: Diagnosis not present

## 2015-05-21 DIAGNOSIS — E1140 Type 2 diabetes mellitus with diabetic neuropathy, unspecified: Secondary | ICD-10-CM | POA: Diagnosis not present

## 2015-05-21 DIAGNOSIS — M1991 Primary osteoarthritis, unspecified site: Secondary | ICD-10-CM | POA: Diagnosis not present

## 2015-05-21 DIAGNOSIS — E039 Hypothyroidism, unspecified: Secondary | ICD-10-CM | POA: Diagnosis not present

## 2015-05-21 DIAGNOSIS — E559 Vitamin D deficiency, unspecified: Secondary | ICD-10-CM | POA: Diagnosis not present

## 2015-05-21 DIAGNOSIS — I4891 Unspecified atrial fibrillation: Secondary | ICD-10-CM | POA: Diagnosis not present

## 2015-05-21 DIAGNOSIS — Z7984 Long term (current) use of oral hypoglycemic drugs: Secondary | ICD-10-CM | POA: Diagnosis not present

## 2015-05-21 NOTE — Telephone Encounter (Signed)
Left message giving vo to continue PT.

## 2015-05-21 NOTE — Telephone Encounter (Signed)
Jaclyn [name] with [facility] called and would like Orders to see [name] once a week for 6 weeks for PT. [phone] can leave verbal orders on voice mail

## 2015-05-23 ENCOUNTER — Institutional Professional Consult (permissible substitution): Payer: Medicare Other | Admitting: Internal Medicine

## 2015-05-25 ENCOUNTER — Other Ambulatory Visit: Payer: Self-pay | Admitting: Cardiovascular Disease

## 2015-06-20 NOTE — Telephone Encounter (Signed)
Pt called to ck on status of carvedilol refill; advised pt already refilled by Dr Kyla Balzarine office. Pt will ck with pharmacy.

## 2015-06-26 ENCOUNTER — Ambulatory Visit: Payer: Medicare Other | Admitting: Family Medicine

## 2015-06-26 DIAGNOSIS — Z0289 Encounter for other administrative examinations: Secondary | ICD-10-CM

## 2015-06-27 ENCOUNTER — Telehealth: Payer: Self-pay | Admitting: Family Medicine

## 2015-06-27 NOTE — Telephone Encounter (Signed)
Patient did not come for their scheduled appointment today for low back pain  Please let me know if the patient needs to be contacted immediately for follow up or if no follow up is necessary.

## 2015-06-29 ENCOUNTER — Institutional Professional Consult (permissible substitution): Payer: Medicare Other | Admitting: Internal Medicine

## 2015-06-29 NOTE — Telephone Encounter (Signed)
No need for follow up

## 2015-07-01 ENCOUNTER — Other Ambulatory Visit: Payer: Self-pay | Admitting: Family Medicine

## 2015-07-09 ENCOUNTER — Telehealth: Payer: Self-pay | Admitting: Family Medicine

## 2015-07-09 NOTE — Telephone Encounter (Signed)
Pt has appt 07/10/15 at 10:45 with Dr Diona Browner.

## 2015-07-09 NOTE — Telephone Encounter (Signed)
Patient Name: Jaclyn [name] DOB: [birth year] Initial Comment Caller states her blood sugar has been running high after every meal, up to 400 lately, uses insulin and glipizide with metformin Nurse Assessment Nurse: Vallery Sa, RN, Cathy Date/Time (Eastern Time): 07/09/2015 12:05:46 PM Confirm and document reason for call. If symptomatic, describe symptoms. You must click the next button to save text entered. ---Caller states her blood sugar was 237 last night. No fever or illness. No vomiting or diarrhea. Has the patient traveled out of the country within the last 30 days? ---No Does the patient have any new or worsening symptoms? ---Yes Will a triage be completed? ---Yes Related visit to physician within the last 2 weeks? ---No Does the PT have any chronic conditions? (i.e. diabetes, asthma, etc.) ---Yes List chronic conditions. 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 ---No Guidelines Guideline Title Affirmed Question Affirmed Notes Diabetes - High Blood Sugar [1] Symptoms of high blood sugar (e.g., frequent urination, weak, weight loss) AND [2] not able to test blood glucose Final Disposition User See Physician within 24 Hours [place], RN, [place] asks if her Eliquis refill was called in? She will call back if she needs to reschedule her appointment (10:45am tomorrow) or if her symptoms change/worsen. Referrals REFERRED TO PCP OFFICE Disagree/Comply: Comply

## 2015-07-10 ENCOUNTER — Ambulatory Visit: Payer: Self-pay | Admitting: Family Medicine

## 2015-07-11 ENCOUNTER — Observation Stay (HOSPITAL_COMMUNITY)
Admission: EM | Admit: 2015-07-11 | Discharge: 2015-07-12 | Disposition: A | Discharge status: Home Health | Payer: Medicare Other | Attending: Internal Medicine | Admitting: Internal Medicine

## 2015-07-11 ENCOUNTER — Encounter (HOSPITAL_COMMUNITY): Payer: Self-pay | Admitting: Emergency Medicine

## 2015-07-11 ENCOUNTER — Institutional Professional Consult (permissible substitution): Payer: Medicare Other | Admitting: Internal Medicine

## 2015-07-11 DIAGNOSIS — E119 Type 2 diabetes mellitus without complications: Secondary | ICD-10-CM | POA: Diagnosis present

## 2015-07-11 DIAGNOSIS — Z79899 Other long term (current) drug therapy: Secondary | ICD-10-CM | POA: Diagnosis not present

## 2015-07-11 DIAGNOSIS — E118 Type 2 diabetes mellitus with unspecified complications: Secondary | ICD-10-CM | POA: Diagnosis present

## 2015-07-11 DIAGNOSIS — E559 Vitamin D deficiency, unspecified: Secondary | ICD-10-CM

## 2015-07-11 DIAGNOSIS — Z87440 Personal history of urinary (tract) infections: Secondary | ICD-10-CM

## 2015-07-11 DIAGNOSIS — Z7901 Long term (current) use of anticoagulants: Secondary | ICD-10-CM | POA: Diagnosis not present

## 2015-07-11 DIAGNOSIS — Z794 Long term (current) use of insulin: Secondary | ICD-10-CM | POA: Insufficient documentation

## 2015-07-11 DIAGNOSIS — R531 Weakness: Secondary | ICD-10-CM | POA: Diagnosis not present

## 2015-07-11 DIAGNOSIS — M797 Fibromyalgia: Secondary | ICD-10-CM | POA: Diagnosis not present

## 2015-07-11 DIAGNOSIS — I480 Paroxysmal atrial fibrillation: Secondary | ICD-10-CM | POA: Diagnosis present

## 2015-07-11 DIAGNOSIS — E1165 Type 2 diabetes mellitus with hyperglycemia: Secondary | ICD-10-CM | POA: Diagnosis present

## 2015-07-11 DIAGNOSIS — B373 Candidiasis of vulva and vagina: Secondary | ICD-10-CM

## 2015-07-11 DIAGNOSIS — F341 Dysthymic disorder: Secondary | ICD-10-CM

## 2015-07-11 DIAGNOSIS — R4781 Slurred speech: Secondary | ICD-10-CM | POA: Insufficient documentation

## 2015-07-11 DIAGNOSIS — I10 Essential (primary) hypertension: Secondary | ICD-10-CM

## 2015-07-11 DIAGNOSIS — R262 Difficulty in walking, not elsewhere classified: Secondary | ICD-10-CM | POA: Diagnosis not present

## 2015-07-11 DIAGNOSIS — E785 Hyperlipidemia, unspecified: Secondary | ICD-10-CM | POA: Diagnosis not present

## 2015-07-11 DIAGNOSIS — I6521 Occlusion and stenosis of right carotid artery: Secondary | ICD-10-CM | POA: Diagnosis not present

## 2015-07-11 DIAGNOSIS — B3731 Acute candidiasis of vulva and vagina: Secondary | ICD-10-CM

## 2015-07-11 DIAGNOSIS — Z8673 Personal history of transient ischemic attack (TIA), and cerebral infarction without residual deficits: Secondary | ICD-10-CM | POA: Insufficient documentation

## 2015-07-11 DIAGNOSIS — G459 Transient cerebral ischemic attack, unspecified: Secondary | ICD-10-CM | POA: Diagnosis not present

## 2015-07-11 DIAGNOSIS — I4891 Unspecified atrial fibrillation: Secondary | ICD-10-CM | POA: Diagnosis present

## 2015-07-11 DIAGNOSIS — Z9119 Patient's noncompliance with other medical treatment and regimen: Secondary | ICD-10-CM | POA: Insufficient documentation

## 2015-07-11 DIAGNOSIS — K760 Fatty (change of) liver, not elsewhere classified: Secondary | ICD-10-CM

## 2015-07-11 DIAGNOSIS — E039 Hypothyroidism, unspecified: Secondary | ICD-10-CM | POA: Diagnosis present

## 2015-07-11 DIAGNOSIS — Z86010 Personal history of colonic polyps: Secondary | ICD-10-CM

## 2015-07-11 DIAGNOSIS — N3000 Acute cystitis without hematuria: Secondary | ICD-10-CM

## 2015-07-11 DIAGNOSIS — I679 Cerebrovascular disease, unspecified: Secondary | ICD-10-CM

## 2015-07-11 DIAGNOSIS — R29810 Facial weakness: Secondary | ICD-10-CM | POA: Insufficient documentation

## 2015-07-11 DIAGNOSIS — R0789 Other chest pain: Secondary | ICD-10-CM

## 2015-07-11 DIAGNOSIS — R29898 Other symptoms and signs involving the musculoskeletal system: Secondary | ICD-10-CM

## 2015-07-11 DIAGNOSIS — H9193 Unspecified hearing loss, bilateral: Secondary | ICD-10-CM

## 2015-07-11 DIAGNOSIS — R251 Tremor, unspecified: Secondary | ICD-10-CM

## 2015-07-11 LAB — DIFFERENTIAL
BASOS PCT: 0 %
Basophils Absolute: 0 10*3/uL (ref 0.0–0.1)
EOS ABS: 0.1 10*3/uL (ref 0.0–0.7)
Eosinophils Relative: 2 %
Lymphocytes Relative: 21 %
Lymphs Abs: 1.1 10*3/uL (ref 0.7–4.0)
MONO ABS: 0.4 10*3/uL (ref 0.1–1.0)
MONOS PCT: 9 %
Neutro Abs: 3.5 10*3/uL (ref 1.7–7.7)
Neutrophils Relative %: 68 %

## 2015-07-11 LAB — URINALYSIS, ROUTINE W REFLEX MICROSCOPIC
Bilirubin Urine: NEGATIVE
Glucose, UA: >1000 mg/dL — AB
Hgb urine dipstick: NEGATIVE
Ketones, ur: 15 mg/dL — AB
Nitrite: NEGATIVE
Protein, ur: NEGATIVE mg/dL
Specific Gravity, Urine: 1.031 — ABNORMAL HIGH (ref 1.005–1.030)
pH: 6 (ref 5.0–8.0)

## 2015-07-11 LAB — URINE MICROSCOPIC-ADD ON: RBC / HPF: NONE SEEN RBC/hpf (ref 0–5)

## 2015-07-11 LAB — I-STAT CHEM 8, ED
BUN: 14 mg/dL (ref 6–20)
CALCIUM ION: 1.17 mmol/L (ref 1.13–1.30)
Chloride: 100 mmol/L — ABNORMAL LOW (ref 101–111)
Creatinine, Ser: 0.8 mg/dL (ref 0.44–1.00)
GLUCOSE: 394 mg/dL — AB (ref 65–99)
HCT: 38 % (ref 36.0–46.0)
HEMOGLOBIN: 12.9 g/dL (ref 12.0–15.0)
POTASSIUM: 4.3 mmol/L (ref 3.5–5.1)
Sodium: 139 mmol/L (ref 135–145)
TCO2: 26 mmol/L (ref 0–100)

## 2015-07-11 LAB — COMPREHENSIVE METABOLIC PANEL WITH GFR
ALT: 47 U/L (ref 14–54)
AST: 45 U/L — ABNORMAL HIGH (ref 15–41)
Albumin: 3.4 g/dL — ABNORMAL LOW (ref 3.5–5.0)
Alkaline Phosphatase: 86 U/L (ref 38–126)
Anion gap: 10 (ref 5–15)
BUN: 12 mg/dL (ref 6–20)
CO2: 25 mmol/L (ref 22–32)
Calcium: 9.5 mg/dL (ref 8.9–10.3)
Chloride: 102 mmol/L (ref 101–111)
Creatinine, Ser: 0.94 mg/dL (ref 0.44–1.00)
GFR calc Af Amer: >60 mL/min
GFR calc non Af Amer: 57 mL/min — ABNORMAL LOW
Glucose, Bld: 410 mg/dL — ABNORMAL HIGH (ref 65–99)
Potassium: 4.7 mmol/L (ref 3.5–5.1)
Sodium: 137 mmol/L (ref 135–145)
Total Bilirubin: 0.7 mg/dL (ref 0.3–1.2)
Total Protein: 6.5 g/dL (ref 6.5–8.1)

## 2015-07-11 LAB — CBC
HCT: 36.1 % (ref 36.0–46.0)
Hemoglobin: 11.7 g/dL — ABNORMAL LOW (ref 12.0–15.0)
MCH: 30.4 pg (ref 26.0–34.0)
MCHC: 32.4 g/dL (ref 30.0–36.0)
MCV: 93.8 fL (ref 78.0–100.0)
Platelets: 268 K/uL (ref 150–400)
RBC: 3.85 MIL/uL — ABNORMAL LOW (ref 3.87–5.11)
RDW: 16.6 % — ABNORMAL HIGH (ref 11.5–15.5)
WBC: 5.2 K/uL (ref 4.0–10.5)

## 2015-07-11 LAB — I-STAT TROPONIN, ED: Troponin i, poc: 0 ng/mL (ref 0.00–0.08)

## 2015-07-11 LAB — PROTIME-INR
INR: 1.08 (ref 0.00–1.49)
Prothrombin Time: 14.2 seconds (ref 11.6–15.2)

## 2015-07-11 LAB — APTT: aPTT: 32 s (ref 24–37)

## 2015-07-11 LAB — ETHANOL

## 2015-07-11 MED ORDER — SODIUM CHLORIDE 0.9 % IV SOLN
Freq: Once | INTRAVENOUS | Status: AC
  Administered 2015-07-12: 01:00:00 via INTRAVENOUS

## 2015-07-11 NOTE — ED Provider Notes (Signed)
CSN: [id number]     Arrival date & time 07/11/15  2232 History   First MD Initiated Contact with Patient 07/11/15 2244     Chief Complaint  Patient presents with  . Transient Ischemic Attack     (Consider location/radiation/quality/duration/timing/severity/associated sxs/prior Treatment) HPI Comments: 80 year old female with a history of hypertension, atrial fibrillation on chronic Eliquis, type 2 diabetes mellitus, TIA, and right ICA stenosis presents to the ED for evaluation of transient L sided facial drooping with associated LUE and LLE weakness, lasting from 2115 to 2123. Symptoms were resolved on EMS arrival at 2130. Patient reports that she had eaten dinner and went to sleep at [place]. She states that she awoke with her symptoms and her son initially noticed facial drooping on her left side. He states, "she acted like she was drunk". He reports that the patient exhibited similar symptoms with her prior TIA. Patient states that she felt as though it was "hard to maneuver" her left side. She denies any associated headache, nausea, vomiting, head trauma, or recent fever. No syncope. Patient states that she last had carotid ultrasounds completed in 10/2014. She is on Eliquis, but ran out of this on Friday. She was taking left over tablets sporadically between Friday and today when she had her script refilled.  PCP - Dr. Diona Browner Cards - [name]  The history is provided by the patient. No language interpreter was used.    Past Medical History  Diagnosis Date  . Unspecified essential hypertension   . Other chest pain   . Atrial fibrillation ([place])   . Cerebrovascular disease, unspecified   . Other and unspecified hyperlipidemia   . Type II or unspecified type diabetes mellitus without mention of complication, not stated as uncontrolled   . Unspecified hypothyroidism   . Diverticulosis of colon (without mention of hemorrhage)   . Benign neoplasm of colon   . Other specified benign mammary  dysplasias   . Urinary tract infection, site not specified   . Osteoarthrosis, unspecified whether generalized or localized, unspecified site   . Myalgia and myositis, unspecified   . Unspecified vitamin D deficiency   . Headache(784.0)   . Dysthymic disorder   . Insomnia, unspecified   . Carotid bruit   . Carotid artery occlusion     60-79% right ICA stenosis  . Fibromyalgia   . Interstitial cystitis   . Difficult intubation 2008    During surgery to remove large polyp  . TIA (transient ischemic attack)    Past Surgical History  Procedure Laterality Date  . Vesicovaginal fistula closure w/ tah  1990    w/ cystocele &retocele repairs Dr. Ree Edman  . Right colectomy  2005    for villous adenoma of the cecum Dr.streck  . Cardiac catheterization  02/19/06    EF 60%  . Colonoscopy    . Abdominal hysterectomy  1988    cervical dysplasia   Family History  Problem Relation Age of Onset  . Lymphoma Father   . Hypertension Father   . Stroke Father   . Hyperlipidemia Father   . Hypertension Mother   . Allergies Brother   . Hypertension Sister     3  . Breast cancer Sister   . Fibromyalgia Sister     3  . Hyperlipidemia Sister     3   Social History  Substance Use Topics  . Smoking status: Never Smoker   . Smokeless tobacco: Never Used  . Alcohol Use: No   OB  History    No data available      Review of Systems  Constitutional: Negative for fever.  Gastrointestinal: Negative for nausea and vomiting.  Neurological: Positive for facial asymmetry and weakness (L sided). Negative for syncope and headaches.  All other systems reviewed and are negative.   Allergies  Naproxen sodium; Statins; Sulfonamide derivatives; Latex; and Shellfish allergy  Home Medications   Prior to Admission medications   Medication Sig Start Date End Date Taking? Authorizing Provider  carvedilol (COREG) 6.25 MG tablet TAKE ONE TABLET BY MOUTH TWICE DAILY 05/28/15  Yes Josue Hector, MD   DULoxetine (CYMBALTA) 30 MG capsule TAKE TWO CAPSULES BY MOUTH ONCE DAILY Patient taking differently: Take 1 capsule twice daily. 04/18/15  Yes Amy E Bedsole, MD  ELIQUIS 5 MG TABS tablet TAKE ONE TABLET BY MOUTH TWICE DAILY 07/02/15  Yes Amy E Bedsole, MD  glipiZIDE (GLUCOTROL) 10 MG tablet TAKE 1 TABLET (10 MG TOTAL) BY MOUTH DAILY BEFORE BREAKFAST. 04/05/15  Yes Amy E Bedsole, MD  LANTUS SOLOSTAR 100 UNIT/ML Solostar Pen INJECT 65 UNITS INTO THE SKIN AT BEDTIME. Patient taking differently: INJECT 75 UNITS INTO THE SKIN AT BEDTIME. 12/14/14  Yes Amy E Bedsole, MD  levothyroxine (SYNTHROID, LEVOTHROID) 112 MCG tablet TAKE 1 TABLET (112 MCG TOTAL) BY MOUTH DAILY. 03/16/15  Yes Amy Cletis [place], MD  metFORMIN (GLUCOPHAGE-XR) 500 MG 24 hr tablet TAKE TWO TABLETS BY MOUTH ONCE DAILY WITH BREAKFAST 03/23/15  Yes Amy E Bedsole, MD  methocarbamol (ROBAXIN) 500 MG tablet Take 1 tablet (500 mg total) by mouth every 8 (eight) hours as needed for muscle spasms. 05/04/13  Yes Noralee Space, MD  quinapril (ACCUPRIL) 40 MG tablet Take 40 mg by mouth at bedtime.   Yes Historical Provider, MD  trimethoprim (TRIMPEX) 100 MG tablet Take 1 tablet (100 mg total) by mouth daily. For UTI prophylaxsis 04/26/15  Yes Amy E Bedsole, MD   BP 131/66 mmHg  Pulse 82  Temp(Src) 98.5 F (36.9 C) (Oral)  Resp 20  SpO2 95%   Physical Exam  Constitutional: She is oriented to person, place, and time. She appears well-developed and well-nourished. No distress.  Nontoxic/nonseptic appearing  HENT:  Head: Normocephalic and atraumatic.  Mouth/Throat: Oropharynx is clear and moist. No oropharyngeal exudate.  Symmetric rise of the uvula with phonation  Eyes: Conjunctivae and EOM are normal. Pupils are equal, round, and reactive to light. No scleral icterus.  Neck: Normal range of motion.  Cardiovascular: Normal rate, regular rhythm and intact distal pulses.   Pulmonary/Chest: Effort normal. No respiratory distress. She has no wheezes.   Respirations even and unlabored  Musculoskeletal: Normal range of motion.  Neurological: She is alert and oriented to person, place, and time. No cranial nerve deficit. She exhibits normal muscle tone. Coordination normal.  GCS 15. Speech is goal oriented. No cranial nerve deficits appreciated; symmetric eyebrow raise, no facial drooping, tongue midline. Patient has equal grip strength bilaterally with 5/5 strength against resistance in all major muscle groups bilaterally. Sensation to light touch intact. No pronator drift. Normal proprioception. Patient moves extremities without ataxia. DTRs symmetric.  Skin: Skin is warm and dry. No rash noted. She is not diaphoretic. No erythema. No pallor.  Psychiatric: She has a normal mood and affect. Her behavior is normal.  Nursing note and vitals reviewed.   ED Course  Procedures (including critical care time) Labs Review Labs Reviewed  CBC - Abnormal; Notable for the following:    RBC 3.85 (*)  Hemoglobin 11.7 (*)    RDW 16.6 (*)    All other components within normal limits  COMPREHENSIVE METABOLIC PANEL - Abnormal; Notable for the following:    Glucose, Bld 410 (*)    Albumin 3.4 (*)    AST 45 (*)    GFR calc non Af Amer 57 (*)    All other components within normal limits  URINALYSIS, ROUTINE W REFLEX MICROSCOPIC (NOT AT [facility]) - Abnormal; Notable for the following:    APPearance TURBID (*)    Specific Gravity, Urine 1.031 (*)    Glucose, UA >1000 (*)    Ketones, ur 15 (*)    Leukocytes, UA SMALL (*)    All other components within normal limits  URINE MICROSCOPIC-ADD ON - Abnormal; Notable for the following:    Squamous Epithelial / LPF 0-5 (*)    Bacteria, UA FEW (*)    All other components within normal limits  I-STAT CHEM 8, ED - Abnormal; Notable for the following:    Chloride 100 (*)    Glucose, Bld 394 (*)    All other components within normal limits  ETHANOL  PROTIME-INR  APTT  DIFFERENTIAL  URINE RAPID DRUG SCREEN,  HOSP PERFORMED  I-STAT TROPOININ, ED    Imaging Review Ct Head Wo Contrast  07/12/2015  CLINICAL DATA:  Stroke-like symptoms. Left facial droop and weakness. EXAM: CT HEAD WITHOUT CONTRAST TECHNIQUE: Contiguous axial images were obtained from the base of the skull through the vertex without intravenous contrast. COMPARISON:  CT 11/01/2014 FINDINGS: No intracranial hemorrhage. Moderate atrophy and chronic small vessel ischemic change, no CT findings of superimposed acute ischemia. No definite hyperdense vessel; symmetric appearance of proximal MCA branches. No evidence of insular ribbon. No subdural or extra-axial fluid collection. The calvarium is intact. Included paranasal sinuses and mastoid air cells are well aerated. IMPRESSION: Unchanged atrophy and chronic small vessel ischemic change. No hemorrhage or CT findings of acute ischemia. Electronically Signed   By: Jeb Levering M.D.   On: 07/12/2015 01:05     I have personally reviewed and evaluated these images and lab results as part of my medical decision-making.   EKG Interpretation   Date/Time:  Wednesday Jul 11 2015 22:36:20 EDT Ventricular Rate:  90 PR Interval:  147 QRS Duration: 83 QT Interval:  376 QTC Calculation: 460 R Axis:   -90 Text Interpretation:  Sinus rhythm Consider right ventricular hypertrophy  Inferior infarct, old Consider anterior infarct No significant change  since last tracing Confirmed by LITTLE MD, RACHEL [phone]) on 07/11/2015  11:31:30 PM       1:27 AM Patient with reassuring work up, but moderate stroke risk. ABCD2 score is 4. Will consult [facility] for admission.  MDM   Final diagnoses:  Transient cerebral ischemia, unspecified transient cerebral ischemia type    Patient presenting for symptoms c/w TIA. She reports inconsistent Eliquis use x 6 days after running out of her Rx. No residual neurologic deficits noted on arrival. Neurologic exam nonfocal. Initial ED work up is reassuring. CT head  negative. Given hx of significant R ICA stenosis, will admit for further TIA work up. Case discussed with Dr. Tamala Julian of [facility] who will admit.   Filed Vitals:   07/11/15 2245 07/11/15 2330 07/11/15 2345 07/12/15 0000  BP: 147/69 145/69 114/71 131/66  Pulse: 92 85 87 82  Temp:      TempSrc:      Resp: 12 15 18 20   SpO2: 97% 96% 97% 95%  Antonietta Breach, PA-C 07/12/15 0201  Julianne Rice, MD 07/12/15 [phone]

## 2015-07-11 NOTE — ED Notes (Signed)
Pt arrived to ED via EMS. Pt woke up having stroke like symptoms. Family observed left side facial droop and weakness. Left sided facial droop resolved before EMS arrival at 21:30. Hx of TIA and diabetes. CBG 336. No current deficits observed.

## 2015-07-12 ENCOUNTER — Observation Stay (HOSPITAL_BASED_OUTPATIENT_CLINIC_OR_DEPARTMENT_OTHER): Payer: Medicare Other

## 2015-07-12 ENCOUNTER — Other Ambulatory Visit (HOSPITAL_COMMUNITY): Payer: Medicare Other

## 2015-07-12 ENCOUNTER — Observation Stay (HOSPITAL_COMMUNITY): Payer: Medicare Other

## 2015-07-12 ENCOUNTER — Encounter (HOSPITAL_COMMUNITY): Payer: Self-pay | Admitting: *Deleted

## 2015-07-12 ENCOUNTER — Emergency Department (HOSPITAL_COMMUNITY): Payer: Medicare Other

## 2015-07-12 DIAGNOSIS — E1165 Type 2 diabetes mellitus with hyperglycemia: Secondary | ICD-10-CM | POA: Diagnosis not present

## 2015-07-12 DIAGNOSIS — G459 Transient cerebral ischemic attack, unspecified: Secondary | ICD-10-CM

## 2015-07-12 DIAGNOSIS — I480 Paroxysmal atrial fibrillation: Secondary | ICD-10-CM | POA: Diagnosis not present

## 2015-07-12 DIAGNOSIS — E039 Hypothyroidism, unspecified: Secondary | ICD-10-CM

## 2015-07-12 DIAGNOSIS — M797 Fibromyalgia: Secondary | ICD-10-CM

## 2015-07-12 DIAGNOSIS — R29810 Facial weakness: Secondary | ICD-10-CM | POA: Diagnosis not present

## 2015-07-12 LAB — RAPID URINE DRUG SCREEN, HOSP PERFORMED
Amphetamines: NOT DETECTED
BARBITURATES: NOT DETECTED
Benzodiazepines: NOT DETECTED
Cocaine: NOT DETECTED
Opiates: NOT DETECTED
TETRAHYDROCANNABINOL: NOT DETECTED

## 2015-07-12 LAB — GLUCOSE, CAPILLARY
GLUCOSE-CAPILLARY: 122 mg/dL — AB (ref 65–99)
Glucose-Capillary: 219 mg/dL — ABNORMAL HIGH (ref 65–99)
Glucose-Capillary: 197 mg/dL — ABNORMAL HIGH (ref 65–99)
Glucose-Capillary: 178 mg/dL — ABNORMAL HIGH (ref 65–99)

## 2015-07-12 LAB — LIPID PANEL
CHOLESTEROL: 215 mg/dL — AB (ref 0–200)
HDL: 31 mg/dL — AB (ref >40)
LDL CALC: 128 mg/dL — AB (ref 0–99)
TRIGLYCERIDES: 280 mg/dL — AB (ref <150)
Total CHOL/HDL Ratio: 6.9 RATIO
VLDL: 56 mg/dL — AB (ref 0–40)

## 2015-07-12 LAB — ECHOCARDIOGRAM COMPLETE

## 2015-07-12 MED ORDER — DULOXETINE HCL 30 MG PO CPEP
30.0000 mg | ORAL_CAPSULE | Freq: Two times a day (BID) | ORAL | Status: DC
Start: 2015-07-12 — End: 2015-07-12
  Administered 2015-07-12: 30 mg via ORAL
  Filled 2015-07-12: qty 1

## 2015-07-12 MED ORDER — INSULIN ASPART 100 UNIT/ML ~~LOC~~ SOLN
0.0000 [IU] | SUBCUTANEOUS | Status: DC
  Administered 2015-07-12 (×2): 2 [IU] via SUBCUTANEOUS
  Administered 2015-07-12: 1 [IU] via SUBCUTANEOUS

## 2015-07-12 MED ORDER — APIXABAN 5 MG PO TABS
5.0000 mg | ORAL_TABLET | Freq: Two times a day (BID) | ORAL | Status: DC
  Administered 2015-07-12: 5 mg via ORAL
  Filled 2015-07-12: qty 1

## 2015-07-12 MED ORDER — EZETIMIBE 10 MG PO TABS: 10.0000 mg | ORAL_TABLET | Freq: Every day | ORAL | Status: DC

## 2015-07-12 MED ORDER — STROKE: EARLY STAGES OF RECOVERY BOOK
Freq: Once | Status: DC
  Filled 2015-07-12: qty 1

## 2015-07-12 MED ORDER — CARVEDILOL 6.25 MG PO TABS
6.2500 mg | ORAL_TABLET | Freq: Two times a day (BID) | ORAL | Status: DC
  Administered 2015-07-12: 6.25 mg via ORAL
  Filled 2015-07-12: qty 1

## 2015-07-12 MED ORDER — EZETIMIBE 10 MG PO TABS
10.0000 mg | ORAL_TABLET | Freq: Every day | ORAL | Status: DC
  Administered 2015-07-12: 10 mg via ORAL
  Filled 2015-07-12: qty 1

## 2015-07-12 MED ORDER — LORAZEPAM 2 MG/ML IJ SOLN
0.5000 mg | Freq: Once | INTRAMUSCULAR | Status: AC
  Administered 2015-07-12: 0.5 mg via INTRAVENOUS
  Filled 2015-07-12: qty 1

## 2015-07-12 MED ORDER — SENNOSIDES-DOCUSATE SODIUM 8.6-50 MG PO TABS: 1.0000 | ORAL_TABLET | Freq: Every evening | ORAL | Status: DC | PRN

## 2015-07-12 MED ORDER — INSULIN GLARGINE 100 UNIT/ML SOLOSTAR PEN: PEN_INJECTOR | SUBCUTANEOUS | Status: DC

## 2015-07-12 MED ORDER — LEVOTHYROXINE SODIUM 112 MCG PO TABS
112.0000 ug | ORAL_TABLET | Freq: Every day | ORAL | Status: DC
  Administered 2015-07-12: 112 ug via ORAL
  Filled 2015-07-12: qty 1

## 2015-07-12 MED ORDER — LISINOPRIL 20 MG PO TABS: 40.0000 mg | ORAL_TABLET | Freq: Every day | ORAL | Status: DC

## 2015-07-12 MED ORDER — INSULIN GLARGINE 100 UNIT/ML ~~LOC~~ SOLN
75.0000 [IU] | Freq: Every day | SUBCUTANEOUS | Status: DC
  Administered 2015-07-12: 75 [IU] via SUBCUTANEOUS
  Filled 2015-07-12 (×2): qty 0.75

## 2015-07-12 NOTE — Consult Note (Addendum)
Requesting Physician: Dr. Tana Coast    Chief Complaint: TIA    HPI:                                                                                                                                         Jaclyn [name] is an 80 y.o. female with medical history significant of HTN, HLD, R. ICA stenosis, TIA, hypothyroidism, atrial fibrillation on chronic anticoagulation, and diabetes mellitus type 2; presented to the ED with symptoms of slurred speech with left-sided facial droop and weakness. Symptoms started approximately 9:15 PM last night and were initially noted to have resolved within 15-20 minutes. Per note she could not stand up and felt as though her left upper and lower extremities would not maneuver right.  She had ran out of her Eliquis 6 days ago and had been intermittently taking doses until she refilled her prescription yesterday night. Patient notes having similar symptoms like this back in September 2016 for which she was evaluated and noted to have right internal carotid artery stent stenosis that was unchanged.   When talking with her she states she feels back to baseline. Her demeanor is of a intoxicated person and slurring her words along with going into tangents.   Date last known well: Yesterday Time last known well: Time: 21:15 tPA Given: No: symptoms resolved.    Past Medical History  Diagnosis Date  . Unspecified essential hypertension   . Other chest pain   . Atrial fibrillation ([place])   . Cerebrovascular disease, unspecified   . Other and unspecified hyperlipidemia   . Type II or unspecified type diabetes mellitus without mention of complication, not stated as uncontrolled   . Unspecified hypothyroidism   . Diverticulosis of colon (without mention of hemorrhage)   . Benign neoplasm of colon   . Other specified benign mammary dysplasias   . Urinary tract infection, site not specified   . Osteoarthrosis, unspecified whether generalized or localized, unspecified site    . Myalgia and myositis, unspecified   . Unspecified vitamin D deficiency   . Headache(784.0)   . Dysthymic disorder   . Insomnia, unspecified   . Carotid bruit   . Carotid artery occlusion     60-79% right ICA stenosis  . Fibromyalgia   . Interstitial cystitis   . Difficult intubation 2008    During surgery to remove large polyp  . TIA (transient ischemic attack)     Past Surgical History  Procedure Laterality Date  . Vesicovaginal fistula closure w/ tah  1990    w/ cystocele &retocele repairs Dr. Ree Edman  . Right colectomy  2005    for villous adenoma of the cecum Dr.streck  . Cardiac catheterization  02/19/06    EF 60%  . Colonoscopy    . Abdominal hysterectomy  1988    cervical dysplasia    Family History  Problem Relation Age of Onset  .  Lymphoma Father   . Hypertension Father   . Stroke Father   . Hyperlipidemia Father   . Hypertension Mother   . Allergies Brother   . Hypertension Sister     3  . Breast cancer Sister   . Fibromyalgia Sister     3  . Hyperlipidemia Sister     3   Social History:  reports that she has never smoked. She has never used smokeless tobacco. She reports that she does not drink alcohol or use illicit drugs.  Allergies:  Allergies  Allergen Reactions  . Naproxen Sodium Other (See Comments)    Fever/aches and pains  . Statins     myalgias  . Sulfonamide Derivatives Hives and Itching  . Latex Itching and Rash  . Shellfish Allergy Itching, Swelling and Rash    Seafood, shrimp    Medications:                                                                                                                           Prior to Admission:  Prescriptions prior to admission  Medication Sig Dispense Refill Last Dose  . carvedilol (COREG) 6.25 MG tablet TAKE ONE TABLET BY MOUTH TWICE DAILY 180 tablet 3 07/11/2015 at 0900  . DULoxetine (CYMBALTA) 30 MG capsule TAKE TWO CAPSULES BY MOUTH ONCE DAILY (Patient taking differently: Take 1 capsule  twice daily.) 60 capsule 5 07/11/2015 at Unknown time  . ELIQUIS 5 MG TABS tablet TAKE ONE TABLET BY MOUTH TWICE DAILY 60 tablet 2 07/11/2015 at 1930  . glipiZIDE (GLUCOTROL) 10 MG tablet TAKE 1 TABLET (10 MG TOTAL) BY MOUTH DAILY BEFORE BREAKFAST. 90 tablet 1 07/11/2015 at Unknown time  . LANTUS SOLOSTAR 100 UNIT/ML Solostar Pen INJECT 65 UNITS INTO THE SKIN AT BEDTIME. (Patient taking differently: INJECT 75 UNITS INTO THE SKIN AT BEDTIME.) 15 pen 5 07/10/2015 at Unknown time  . levothyroxine (SYNTHROID, LEVOTHROID) 112 MCG tablet TAKE 1 TABLET (112 MCG TOTAL) BY MOUTH DAILY. 30 tablet 11 07/11/2015 at Unknown time  . metFORMIN (GLUCOPHAGE-XR) 500 MG 24 hr tablet TAKE TWO TABLETS BY MOUTH ONCE DAILY WITH BREAKFAST 60 tablet 5 07/11/2015 at Unknown time  . methocarbamol (ROBAXIN) 500 MG tablet Take 1 tablet (500 mg total) by mouth every 8 (eight) hours as needed for muscle spasms. 90 tablet 1 unk  . quinapril (ACCUPRIL) 40 MG tablet Take 40 mg by mouth at bedtime.   07/10/2015 at Unknown time  . trimethoprim (TRIMPEX) 100 MG tablet Take 1 tablet (100 mg total) by mouth daily. For UTI prophylaxsis 30 tablet 11 07/11/2015 at Unknown time   Scheduled: .  stroke: mapping our early stages of recovery book   Does not apply Once  . apixaban  5 mg Oral BID  . carvedilol  6.25 mg Oral BID WC  . DULoxetine  30 mg Oral BID  . ezetimibe  10 mg Oral Daily  . insulin aspart  0-9 Units Subcutaneous Q4H  .  insulin glargine  75 Units Subcutaneous Q2200  . levothyroxine  112 mcg Oral QAC breakfast  . lisinopril  40 mg Oral QHS    ROS:                                                                                                                                       History obtained from the patient  General ROS: negative for - chills, fatigue, fever, night sweats, weight gain or weight loss Psychological ROS: negative for - behavioral disorder, hallucinations, memory difficulties, mood swings or suicidal  ideation Ophthalmic ROS: negative for - blurry vision, double vision, eye pain or loss of vision ENT ROS: negative for - epistaxis, nasal discharge, oral lesions, sore throat, tinnitus or vertigo Allergy and Immunology ROS: negative for - hives or itchy/watery eyes Hematological and Lymphatic ROS: negative for - bleeding problems, bruising or swollen lymph nodes Endocrine ROS: negative for - galactorrhea, hair pattern changes, polydipsia/polyuria or temperature intolerance Respiratory ROS: negative for - cough, hemoptysis, shortness of breath or wheezing Cardiovascular ROS: negative for - chest pain, dyspnea on exertion, edema or irregular heartbeat Gastrointestinal ROS: negative for - abdominal pain, diarrhea, hematemesis, nausea/vomiting or stool incontinence Genito-Urinary ROS: negative for - dysuria, hematuria, incontinence or urinary frequency/urgency Musculoskeletal ROS: negative for - joint swelling or muscular weakness Neurological ROS: as noted in HPI Dermatological ROS: negative for rash and skin lesion changes  Neurologic Examination:                                                                                                      Blood pressure 113/63, pulse 77, temperature 97.7 F (36.5 C), temperature source Oral, resp. rate 18, SpO2 96 %.  HEENT-  Normocephalic, no lesions, without obvious abnormality.  Normal external eye and conjunctiva.  Normal TM's bilaterally.  Normal auditory canals and external ears. Normal external nose, mucus membranes and septum.  Normal pharynx. Cardiovascular- irregularly irregular rhythm, pulses palpable throughout   Lungs- chest clear, no wheezing, rales, normal symmetric air entry Abdomen- normal findings: bowel sounds normal Extremities- no edema Lymph-no adenopathy palpable Musculoskeletal-no joint tenderness, deformity or swelling Skin-warm and dry, no hyperpigmentation, vitiligo, or suspicious lesions  Neurological Examination Mental  Status: Alert, oriented, thought content appropriate.  Speech fluent without evidence of aphasia.  Able to follow 3 step commands without difficulty. Cranial Nerves: II: Visual fields grossly normal, pupils equal, round, reactive to light and accommodation III,IV, VI: ptosis not present, extra-ocular motions intact bilaterally V,VII: smile  symmetric, facial light touch sensation normal bilaterally VIII: hearing normal bilaterally IX,X: uvula rises symmetrically XI: bilateral shoulder shrug XII: midline tongue extension Motor: Right : Upper extremity   5/5    Left:     Upper extremity   5/5  Lower extremity   5/5     Lower extremity   5/5 Tone and bulk:normal tone throughout; no atrophy noted Sensory: Pinprick and light touch intact throughout, bilaterally Deep Tendon Reflexes: 2+ and symmetric throughout Plantars: Right: downgoing   Left: downgoing Cerebellar: normal finger-to-nose  and normal heel-to-shin test Gait: not tested       Lab Results: Basic Metabolic Panel:  Recent Labs Lab 07/11/15 2313 07/11/15 2332  NA 137 139  K 4.7 4.3  CL 102 100*  CO2 25  --   GLUCOSE 410* 394*  BUN 12 14  CREATININE 0.94 0.80  CALCIUM 9.5  --     Liver Function Tests:  Recent Labs Lab 07/11/15 2313  AST 45*  ALT 47  ALKPHOS 86  BILITOT 0.7  PROT 6.5  ALBUMIN 3.4*   No results for input(s): LIPASE, AMYLASE in the last 168 hours. No results for input(s): AMMONIA in the last 168 hours.  CBC:  Recent Labs Lab 07/11/15 2313 07/11/15 2332  WBC 5.2  --   NEUTROABS 3.5  --   HGB 11.7* 12.9  HCT 36.1 38.0  MCV 93.8  --   PLT 268  --     Cardiac Enzymes: No results for input(s): CKTOTAL, CKMB, CKMBINDEX, TROPONINI in the last 168 hours.  Lipid Panel:  Recent Labs Lab 07/12/15 0646  CHOL 215*  TRIG 280*  HDL 31*  CHOLHDL 6.9  VLDL 56*  LDLCALC 128*    CBG:  Recent Labs Lab 07/12/15 0429 07/12/15 0456 07/12/15 0813  GLUCAP 219* 197* 122*     Microbiology: Results for orders placed or performed in visit on 04/10/15  Urine culture     Status: None   Collection Time: 04/11/15  6:33 PM  Result Value Ref Range Status   Culture ESCHERICHIA COLI  Final   Colony Count >=100,000 COLONIES/ML  Final   Organism ID, Bacteria ESCHERICHIA COLI  Final      Susceptibility   Escherichia coli -  (no method available)    AMPICILLIN <=2 Sensitive     AMOX/CLAVULANIC <=2 Sensitive     AMPICILLIN/SULBACTAM <=2 Sensitive     PIP/TAZO <=4 Sensitive     IMIPENEM <=0.25 Sensitive     CEFAZOLIN <=4 Not Reportable     CEFTRIAXONE <=1 Sensitive     CEFTAZIDIME <=1 Sensitive     CEFEPIME <=1 Sensitive     GENTAMICIN <=1 Sensitive     TOBRAMYCIN <=1 Sensitive     CIPROFLOXACIN 0.5 Sensitive     LEVOFLOXACIN 1 Sensitive     NITROFURANTOIN <=16 Sensitive     TRIMETH/SULFA* <=20 Sensitive      * NR=NOT REPORTABLE,SEE COMMENTORAL therapy:A cefazolin MIC of <32 predicts susceptibility to the oral agents cefaclor,cefdinir,cefpodoxime,cefprozil,cefuroxime,cephalexin,and loracarbef when used for therapy of uncomplicated UTIs due to E.coli,K.pneumomiae,and P.mirabilis. PARENTERAL therapy: A cefazolinMIC of >8 indicates resistance to parenteralcefazolin. An alternate test method must beperformed to confirm susceptibility to parenteralcefazolin.    Coagulation Studies:  Recent Labs  07/11/15 2313  LABPROT 14.2  INR 1.08    Imaging: Ct Head Wo Contrast  07/12/2015  CLINICAL DATA:  Stroke-like symptoms. Left facial droop and weakness. EXAM: CT HEAD WITHOUT CONTRAST TECHNIQUE: Contiguous axial images were obtained from  the base of the skull through the vertex without intravenous contrast. COMPARISON:  CT 11/01/2014 FINDINGS: No intracranial hemorrhage. Moderate atrophy and chronic small vessel ischemic change, no CT findings of superimposed acute ischemia. No definite hyperdense vessel; symmetric appearance of proximal MCA branches. No evidence of  insular ribbon. No subdural or extra-axial fluid collection. The calvarium is intact. Included paranasal sinuses and mastoid air cells are well aerated. IMPRESSION: Unchanged atrophy and chronic small vessel ischemic change. No hemorrhage or CT findings of acute ischemia. Electronically Signed   By: Jeb Levering M.D.   On: 07/12/2015 01:05   Mr Brain Wo Contrast  07/12/2015  CLINICAL DATA:  Transient LEFT facial droop, LEFT extremity weakness yesterday evening. Similar symptoms associated with prior TIA. History of hypertension, atrial fibrillation, diabetes, RIGHT internal carotid artery stenosis. EXAM: MRI HEAD WITHOUT CONTRAST MRA HEAD WITHOUT CONTRAST TECHNIQUE: Multiplanar, multiecho pulse sequences of the brain and surrounding structures were obtained without intravenous contrast. Angiographic images of the head were obtained using MRA technique without contrast. COMPARISON:  CT HEAD Jul 12, 2015 at 0029 hours FINDINGS: MRI HEAD FINDINGS Multiple sequences are moderately motion degraded. INTRACRANIAL CONTENTS: No reduced diffusion to suggest acute ischemia. No susceptibility artifact to suggest hemorrhage. The ventricles and sulci are normal for patient's age. Patchy to confluent supratentorial and pontine white matter FLAIR T2 hyperintensities. No suspicious parenchymal signal, mass lesions, mass effect. No abnormal extra-axial fluid collections. No extra-axial masses though, contrast enhanced sequences would be more sensitive. Normal major intracranial vascular flow voids present at skull base. ORBITS: The included ocular globes and orbital contents are non-suspicious. Status post LEFT ocular lens implant. SINUSES: Mild paranasal sinus mucosal thickening and trace mastoid effusions. SKULL/SOFT TISSUES: No abnormal sellar expansion. No suspicious calvarial bone marrow signal. Craniocervical junction maintained. MRA HEAD FINDINGS Severely motion degraded examination. Anterior circulation: Flow related  enhancement of the internal carotid arteries, and anterior and middle cerebral arteries. Posterior circulation: Flow related enhancement within bilateral vertebral arteries, basilar artery and bilateral posterior cerebral arteries. Due to motion, limited assessment for aneurysm, stenosis or luminal irregularity. No definite large vessel occlusion. IMPRESSION: MRI HEAD: Moderately motion degraded examination without acute intracranial process, specifically no acute ischemia. Involutional changes. Moderate to severe white matter changes compatible with chronic small vessel ischemic disease. MRA HEAD: Severely motion degraded examination. No definite emergent large vessel occlusion though, limited evaluation. Electronically Signed   By: Elon Alas M.D.   On: 07/12/2015 06:25   Mr Jodene Nam Head/brain Wo Cm  07/12/2015  CLINICAL DATA:  Transient LEFT facial droop, LEFT extremity weakness yesterday evening. Similar symptoms associated with prior TIA. History of hypertension, atrial fibrillation, diabetes, RIGHT internal carotid artery stenosis. EXAM: MRI HEAD WITHOUT CONTRAST MRA HEAD WITHOUT CONTRAST TECHNIQUE: Multiplanar, multiecho pulse sequences of the brain and surrounding structures were obtained without intravenous contrast. Angiographic images of the head were obtained using MRA technique without contrast. COMPARISON:  CT HEAD Jul 12, 2015 at 0029 hours FINDINGS: MRI HEAD FINDINGS Multiple sequences are moderately motion degraded. INTRACRANIAL CONTENTS: No reduced diffusion to suggest acute ischemia. No susceptibility artifact to suggest hemorrhage. The ventricles and sulci are normal for patient's age. Patchy to confluent supratentorial and pontine white matter FLAIR T2 hyperintensities. No suspicious parenchymal signal, mass lesions, mass effect. No abnormal extra-axial fluid collections. No extra-axial masses though, contrast enhanced sequences would be more sensitive. Normal major intracranial vascular  flow voids present at skull base. ORBITS: The included ocular globes and orbital contents are non-suspicious. Status post LEFT  ocular lens implant. SINUSES: Mild paranasal sinus mucosal thickening and trace mastoid effusions. SKULL/SOFT TISSUES: No abnormal sellar expansion. No suspicious calvarial bone marrow signal. Craniocervical junction maintained. MRA HEAD FINDINGS Severely motion degraded examination. Anterior circulation: Flow related enhancement of the internal carotid arteries, and anterior and middle cerebral arteries. Posterior circulation: Flow related enhancement within bilateral vertebral arteries, basilar artery and bilateral posterior cerebral arteries. Due to motion, limited assessment for aneurysm, stenosis or luminal irregularity. No definite large vessel occlusion. IMPRESSION: MRI HEAD: Moderately motion degraded examination without acute intracranial process, specifically no acute ischemia. Involutional changes. Moderate to severe white matter changes compatible with chronic small vessel ischemic disease. MRA HEAD: Severely motion degraded examination. No definite emergent large vessel occlusion though, limited evaluation. Electronically Signed   By: Elon Alas M.D.   On: 07/12/2015 06:25   VASCULAR LAB PRELIMINARY PRELIMINARY PRELIMINARY PRELIMINARY  Carotid duplex completed.   Preliminary report: Right: 40-59% ICA stenosis, highest end of scale, however, velocities may be higher as patient was constantly moving. Left: 40-59% ICA stenosis, low end of range. Vertebral artery flow is antegrade.   Echo: Study Conclusions  - Left ventricle: The cavity size was normal. Wall thickness was  increased in a pattern of mild LVH. Systolic function was normal.  The estimated ejection fraction was in the range of 60% to 65%.  Wall motion was normal; there were no regional wall motion  abnormalities. Doppler parameters are consistent with abnormal  left ventricular  relaxation (grade 1 diastolic dysfunction).  LDL 128 A1c pending    Assessment and plan discussed with with attending physician and they are in agreement.    Etta Quill PA-C [facility] [phone]  07/12/2015, 12:10 PM   Assessment: 80 y.o. female with transient left sided weakness in the setting of Eliquis noncompliance. I suspect that this does represent TIA and the patient has resolved. She has had lipid panel with an LDL that would merit treatment, however she is unable to tolerate statins and therefore has been started onset area. Needs better control of her diabetes. She has bilateral 40-59% stenosis, but given that she was noncompliant with medications I feel like this is more likely cause of her symptoms  Stroke Risk Factors - atrial fibrillation, carotid stenosis and diabetes mellitus  1) agree with treatment of lipids 2) I advised the patient to be compliant with her Eliquis 3) she can follow-up with outpatient neurology.   Roland Rack, MD [facility] [phone]  If 7pm- 7am, please page neurology on call as listed in [place].

## 2015-07-12 NOTE — ED Notes (Signed)
MD at bedside. 

## 2015-07-12 NOTE — Progress Notes (Signed)
Patient was discharged home by MD order; discharged instructions review and give to patient and her son with care notes and prescriptions; IV DIC; skin intact; patient will be escorted to the car by nurse tech via wheelchair.

## 2015-07-12 NOTE — Care Management Note (Addendum)
Case Management Note  Patient Details  Name: Jaclyn [name] MRN: [id number] Date of Birth: [birth year]  Subjective/Objective:          Pt with history  of HTN, HLD, R. ICA stenosis, TIA, hypothyroidism, atrial fibrillation on chronic anticoagulation, and diabetes mellitus type 2; who presents with complaints of slurred speech with left-sided facial droop and weakness. Lives alone, states son [name] resides with her intermittently. PTA independent with ADL's. Owns walker. PCP: Daleen Squibb.   Action/Plan: Stroke/TIA work-up in process CM to f/u with disposition needs  Expected Discharge Date:  07/14/15               Expected Discharge Plan:  Home/Self Care  In-House Referral:     Discharge planning Services  CM Consult  Post Acute Care Choice:    Choice offered to:     DME Arranged:    DME Agency:     HH Arranged:    HH Agency:     Status of Service:  In process, will continue to follow  Medicare Important Message Given:    Date Medicare IM Given:    Medicare IM give by:    Date Additional Medicare IM Given:    Additional Medicare Important Message give by:     If discussed at [place] of Stay Meetings, dates discussed:    Additional Comments: Nyeemah Rieff (Son) [phone] Davina Poke ([place])  [phone]  [facility] [address] [place], [place] [phone] 07/12/2015, 9:39 AM

## 2015-07-12 NOTE — Care Management Obs Status (Signed)
[place] NOTIFICATION   Patient Details  Name: Jaclyn [name] MRN: [id number] Date of Birth: 10-29-1935   Medicare Observation Status Notification Given:  Yes    Sharin Mons, RN 07/12/2015, 2:50 PM

## 2015-07-12 NOTE — ED Notes (Signed)
Patient transported to CT 

## 2015-07-12 NOTE — Evaluation (Addendum)
Physical Therapy Evaluation Patient Details Name: Jaclyn [name] MRN: [id number] DOB: 1935-08-09 Today's Date: 07/12/2015   History of Present Illness  Pt adm with transient weakness and speech difficulties. MRI negative and thought to be TIA. PMH - DM, afib, HTN, TIA  Clinical Impression  Pt admitted with above diagnosis and presents to PT with functional limitations due to deficits listed below (See PT problem list). Pt to dc home with family. Recommend HHPT (pt may be active with someone) to address baseline gait and balance deficits. Son present during evaluation and reports pt has periods of unsteady gait at home. Also pt was sleeping prior to eval and son reports she had Ativan earlier in the AM which may have affected gait.    Follow Up Recommendations Home health PT;Supervision - Intermittent    Equipment Recommendations  None recommended by PT    Recommendations for Other Services       Precautions / Restrictions Precautions Precautions: Fall      Mobility  Bed Mobility Overal bed mobility: Modified Independent                Transfers Overall transfer level: Needs assistance Equipment used: None Transfers: Sit to/from Stand Sit to Stand: Supervision         General transfer comment: for safety.   Ambulation/Gait Ambulation/Gait assistance: Min guard Ambulation Distance (Feet): 225 Feet Assistive device: None;4-wheeled walker (wall rail) Gait Pattern/deviations: Step-through pattern;Decreased stride length;Drifts right/left Gait velocity: decr Gait velocity interpretation: <1.8 ft/sec, indicative of risk for recurrent falls General Gait Details: Pt unsteady with gait but no overt loss of balance. Used rollator for portion without any noticable improvement  Financial trader Rankin (Stroke Patients Only)       Balance Overall balance assessment: Needs assistance;History of Falls Sitting-balance support: No  upper extremity supported;Feet supported Sitting balance-Leahy Scale: Normal     Standing balance support: No upper extremity supported Standing balance-Leahy Scale: Fair                               Pertinent Vitals/Pain Pain Assessment: No/denies pain    Home Living Family/patient expects to be discharged to:: Private residence Living Arrangements: Children Available Help at Discharge: Family Type of Home: House Home Access: Level entry     Home Layout: One level Home Equipment: None      Prior Function Level of Independence: Independent         Comments: Son reports intermittent unsteady gait with some falls     Hand Dominance        Extremity/Trunk Assessment   Upper Extremity Assessment: Overall WFL for tasks assessed           Lower Extremity Assessment: Overall WFL for tasks assessed         Communication   Communication: HOH  Cognition Arousal/Alertness: Awake/alert Behavior During Therapy: WFL for tasks assessed/performed Overall Cognitive Status: Within Functional Limits for tasks assessed Area of Impairment: Memory     Memory: Decreased short-term memory         General Comments: Pt's answers are vague and tangential. Question early dementia.    General Comments      Exercises        Assessment/Plan    PT Assessment All further PT needs can be met in the next venue of care  PT Diagnosis Difficulty  walking   PT Problem List Decreased balance;Decreased mobility;Decreased cognition  PT Treatment Interventions     PT Goals (Current goals can be found in the Care Plan section) Acute Rehab PT Goals Patient Stated Goal: go home PT Goal Formulation: All assessment and education complete, DC therapy    Frequency     Barriers to discharge        Co-evaluation               End of Session   Activity Tolerance: Patient tolerated treatment well Patient left: in bed;with bed alarm set;with call bell/phone  within reach;with family/visitor present Nurse Communication: Mobility status    Functional Assessment Tool Used: clinical judgement Functional Limitation: Mobility: Walking and moving around Mobility: Walking and Moving Around Current Status (O1157): At least 1 percent but less than 20 percent impaired, limited or restricted Mobility: Walking and Moving Around Goal Status [phone]): At least 1 percent but less than 20 percent impaired, limited or restricted Mobility: Walking and Moving Around Discharge Status [phone]): At least 1 percent but less than 20 percent impaired, limited or restricted    Time: 1359-1420 PT Time Calculation (min) (ACUTE ONLY): 21 min   Charges:   PT Evaluation $PT Eval Moderate Complexity: 1 Procedure     PT G Codes:   PT G-Codes **NOT FOR INPATIENT CLASS** Functional Assessment Tool Used: clinical judgement Functional Limitation: Mobility: Walking and moving around Mobility: Walking and Moving Around Current Status (B6384): At least 1 percent but less than 20 percent impaired, limited or restricted Mobility: Walking and Moving Around Goal Status [phone]): At least 1 percent but less than 20 percent impaired, limited or restricted Mobility: Walking and Moving Around Discharge Status [phone]): At least 1 percent but less than 20 percent impaired, limited or restricted    [facility] 07/12/2015, 4:16 PM [place]

## 2015-07-12 NOTE — Progress Notes (Signed)
Echocardiogram 2D Echocardiogram has been performed.  Tresa Res 07/12/2015, 11:03 AM

## 2015-07-12 NOTE — Discharge Summary (Signed)
Physician Discharge Summary   Patient ID: Jaclyn [name] MRN: [id number] DOB/AGE: 1935/11/06 80 y.o.  Admit date: 07/11/2015 Discharge date: 07/12/2015  Primary Care Physician:  Eliezer Lofts, MD  Discharge Diagnoses:    . TIA (transient ischemic attack)   Hyperlipidemia   . PAROXYSMAL ATRIAL FIBRILLATION . Hypothyroidism . Poorly controlled diabetes mellitus ([place]) . Fibromyalgia  Consults:  Neurology, Dr. Leonel Ramsay  Recommendations for Outpatient Follow-up:  1. Please repeat CBC/BMET at next visit 2. Patient is intolerant of statins, started on Zetia 10 mg daily   DIET: Carbmodified diet    Allergies:   Allergies  Allergen Reactions  . Naproxen Sodium Other (See Comments)    Fever/aches and pains  . Statins     myalgias  . Sulfonamide Derivatives Hives and Itching  . Latex Itching and Rash  . Shellfish Allergy Itching, Swelling and Rash    Seafood, shrimp     DISCHARGE MEDICATIONS: Current Discharge Medication List    START taking these medications   Details  ezetimibe (ZETIA) 10 MG tablet Take 1 tablet (10 mg total) by mouth daily. Qty: 30 tablet, Refills: 3      CONTINUE these medications which have CHANGED   Details  Insulin Glargine (LANTUS SOLOSTAR) 100 UNIT/ML Solostar Pen INJECT 75 UNITS INTO THE SKIN AT BEDTIME. Qty: 15 pen, Refills: 5      CONTINUE these medications which have NOT CHANGED   Details  carvedilol (COREG) 6.25 MG tablet TAKE ONE TABLET BY MOUTH TWICE DAILY Qty: 180 tablet, Refills: 3    DULoxetine (CYMBALTA) 30 MG capsule TAKE TWO CAPSULES BY MOUTH ONCE DAILY Qty: 60 capsule, Refills: 5    ELIQUIS 5 MG TABS tablet TAKE ONE TABLET BY MOUTH TWICE DAILY Qty: 60 tablet, Refills: 2    glipiZIDE (GLUCOTROL) 10 MG tablet TAKE 1 TABLET (10 MG TOTAL) BY MOUTH DAILY BEFORE BREAKFAST. Qty: 90 tablet, Refills: 1    levothyroxine (SYNTHROID, LEVOTHROID) 112 MCG tablet TAKE 1 TABLET (112 MCG TOTAL) BY MOUTH DAILY. Qty: 30 tablet,  Refills: 11    metFORMIN (GLUCOPHAGE-XR) 500 MG 24 hr tablet TAKE TWO TABLETS BY MOUTH ONCE DAILY WITH BREAKFAST Qty: 60 tablet, Refills: 5    methocarbamol (ROBAXIN) 500 MG tablet Take 1 tablet (500 mg total) by mouth every 8 (eight) hours as needed for muscle spasms. Qty: 90 tablet, Refills: 1    quinapril (ACCUPRIL) 40 MG tablet Take 40 mg by mouth at bedtime.    trimethoprim (TRIMPEX) 100 MG tablet Take 1 tablet (100 mg total) by mouth daily. For UTI prophylaxsis Qty: 30 tablet, Refills: 11         Brief H and P: For complete details please refer to admission H and P, but in briefPatient is a 80 year old female withHTN, HLD, R. ICA stenosis, TIA, hypothyroidism, atrial fibrillation on chronic anticoagulation, and diabetes mellitus type 2; who presents with complaints of slurred speech with left-sided facial droop and weakness. Patient's symptoms had resolved in 15-20 minutes. Patient was admitted for TIA workup.  Hospital Course:   Left-sided weakness and slurred speech question of TIA (transient ischemic attack)/stroke: Acute. Patient reported left sided facial droop with weakness.    Neurology was consulted. - MRI brain showed no acute ischemia, moderate to severe white matter changes compatible with chronic small vessel ischemic disease. - 2-D echo showed EF [id number], grade 1 diastolic dysfunction, no regional wall motion abnormalities - Carotid Dopplers showed 40-59% stenosis on the right ICA, left 40-59% ICA stenosis.  - Discussed with  neurology, recommended no further workup, patient recommended to control diabetes, secondary stroke prevention - Lipid panel showed LDL 128, goal less than 70, patient is statin intolerant. Placed on Zetia - Hemoglobin A1c 8.6 in 12/16, currently in process. Patient's diabetes needs to be better controlled outpatient. - Currently ambulating, will be seen by PT prior to discharge.   Paroxysmal Atrial fibrillation on Elquis: chadvasc score = 7   - Continue Eliquis - Counseled patient on importance of not disrupting Eliquis dosing   Diabetes mellitus type 2 uncontrolled: Initial blood glucose elevated at 410 on admission.  - Continue metformin, glipizide, Lantus   Essential hypertension - Continue quinapril   Hypothyroidism - continue levothyroxine  Fibromyalgia  - Continue duloxetine  Day of Discharge BP 113/63 mmHg  Pulse 77  Temp(Src) 97.7 F (36.5 C) (Oral)  Resp 18  SpO2 96%  Physical Exam: General: Alert and awake oriented x3 not in any acute distress. HEENT: anicteric sclera, pupils reactive to light and accommodation CVS: S1-S2 clear no murmur rubs or gallops Chest: clear to auscultation bilaterally, no wheezing rales or rhonchi Abdomen: soft nontender, nondistended, normal bowel sounds Extremities: no cyanosis, clubbing or edema noted bilaterally Neuro: Cranial nerves II-XII intact, no focal neurological deficits   The results of significant diagnostics from this hospitalization (including imaging, microbiology, ancillary and laboratory) are listed below for reference.    LAB RESULTS: Basic Metabolic Panel:  Recent Labs Lab 07/11/15 2313 07/11/15 2332  NA 137 139  K 4.7 4.3  CL 102 100*  CO2 25  --   GLUCOSE 410* 394*  BUN 12 14  CREATININE 0.94 0.80  CALCIUM 9.5  --    Liver Function Tests:  Recent Labs Lab 07/11/15 2313  AST 45*  ALT 47  ALKPHOS 86  BILITOT 0.7  PROT 6.5  ALBUMIN 3.4*   No results for input(s): LIPASE, AMYLASE in the last 168 hours. No results for input(s): AMMONIA in the last 168 hours. CBC:  Recent Labs Lab 07/11/15 2313 07/11/15 2332  WBC 5.2  --   NEUTROABS 3.5  --   HGB 11.7* 12.9  HCT 36.1 38.0  MCV 93.8  --   PLT 268  --    Cardiac Enzymes: No results for input(s): CKTOTAL, CKMB, CKMBINDEX, TROPONINI in the last 168 hours. BNP: Invalid input(s): POCBNP CBG:  Recent Labs Lab 07/12/15 0813 07/12/15 1225  GLUCAP 122* 178*     Significant Diagnostic Studies:  Ct Head Wo Contrast  07/12/2015  CLINICAL DATA:  Stroke-like symptoms. Left facial droop and weakness. EXAM: CT HEAD WITHOUT CONTRAST TECHNIQUE: Contiguous axial images were obtained from the base of the skull through the vertex without intravenous contrast. COMPARISON:  CT 11/01/2014 FINDINGS: No intracranial hemorrhage. Moderate atrophy and chronic small vessel ischemic change, no CT findings of superimposed acute ischemia. No definite hyperdense vessel; symmetric appearance of proximal MCA branches. No evidence of insular ribbon. No subdural or extra-axial fluid collection. The calvarium is intact. Included paranasal sinuses and mastoid air cells are well aerated. IMPRESSION: Unchanged atrophy and chronic small vessel ischemic change. No hemorrhage or CT findings of acute ischemia. Electronically Signed   By: Jeb Levering M.D.   On: 07/12/2015 01:05   Mr Brain Wo Contrast  07/12/2015  CLINICAL DATA:  Transient LEFT facial droop, LEFT extremity weakness yesterday evening. Similar symptoms associated with prior TIA. History of hypertension, atrial fibrillation, diabetes, RIGHT internal carotid artery stenosis. EXAM: MRI HEAD WITHOUT CONTRAST MRA HEAD WITHOUT CONTRAST TECHNIQUE: Multiplanar, multiecho pulse  sequences of the brain and surrounding structures were obtained without intravenous contrast. Angiographic images of the head were obtained using MRA technique without contrast. COMPARISON:  CT HEAD Jul 12, 2015 at 0029 hours FINDINGS: MRI HEAD FINDINGS Multiple sequences are moderately motion degraded. INTRACRANIAL CONTENTS: No reduced diffusion to suggest acute ischemia. No susceptibility artifact to suggest hemorrhage. The ventricles and sulci are normal for patient's age. Patchy to confluent supratentorial and pontine white matter FLAIR T2 hyperintensities. No suspicious parenchymal signal, mass lesions, mass effect. No abnormal extra-axial fluid collections. No  extra-axial masses though, contrast enhanced sequences would be more sensitive. Normal major intracranial vascular flow voids present at skull base. ORBITS: The included ocular globes and orbital contents are non-suspicious. Status post LEFT ocular lens implant. SINUSES: Mild paranasal sinus mucosal thickening and trace mastoid effusions. SKULL/SOFT TISSUES: No abnormal sellar expansion. No suspicious calvarial bone marrow signal. Craniocervical junction maintained. MRA HEAD FINDINGS Severely motion degraded examination. Anterior circulation: Flow related enhancement of the internal carotid arteries, and anterior and middle cerebral arteries. Posterior circulation: Flow related enhancement within bilateral vertebral arteries, basilar artery and bilateral posterior cerebral arteries. Due to motion, limited assessment for aneurysm, stenosis or luminal irregularity. No definite large vessel occlusion. IMPRESSION: MRI HEAD: Moderately motion degraded examination without acute intracranial process, specifically no acute ischemia. Involutional changes. Moderate to severe white matter changes compatible with chronic small vessel ischemic disease. MRA HEAD: Severely motion degraded examination. No definite emergent large vessel occlusion though, limited evaluation. Electronically Signed   By: Elon Alas M.D.   On: 07/12/2015 06:25   Mr Jodene Nam Head/brain Wo Cm  07/12/2015  CLINICAL DATA:  Transient LEFT facial droop, LEFT extremity weakness yesterday evening. Similar symptoms associated with prior TIA. History of hypertension, atrial fibrillation, diabetes, RIGHT internal carotid artery stenosis. EXAM: MRI HEAD WITHOUT CONTRAST MRA HEAD WITHOUT CONTRAST TECHNIQUE: Multiplanar, multiecho pulse sequences of the brain and surrounding structures were obtained without intravenous contrast. Angiographic images of the head were obtained using MRA technique without contrast. COMPARISON:  CT HEAD Jul 12, 2015 at 0029 hours  FINDINGS: MRI HEAD FINDINGS Multiple sequences are moderately motion degraded. INTRACRANIAL CONTENTS: No reduced diffusion to suggest acute ischemia. No susceptibility artifact to suggest hemorrhage. The ventricles and sulci are normal for patient's age. Patchy to confluent supratentorial and pontine white matter FLAIR T2 hyperintensities. No suspicious parenchymal signal, mass lesions, mass effect. No abnormal extra-axial fluid collections. No extra-axial masses though, contrast enhanced sequences would be more sensitive. Normal major intracranial vascular flow voids present at skull base. ORBITS: The included ocular globes and orbital contents are non-suspicious. Status post LEFT ocular lens implant. SINUSES: Mild paranasal sinus mucosal thickening and trace mastoid effusions. SKULL/SOFT TISSUES: No abnormal sellar expansion. No suspicious calvarial bone marrow signal. Craniocervical junction maintained. MRA HEAD FINDINGS Severely motion degraded examination. Anterior circulation: Flow related enhancement of the internal carotid arteries, and anterior and middle cerebral arteries. Posterior circulation: Flow related enhancement within bilateral vertebral arteries, basilar artery and bilateral posterior cerebral arteries. Due to motion, limited assessment for aneurysm, stenosis or luminal irregularity. No definite large vessel occlusion. IMPRESSION: MRI HEAD: Moderately motion degraded examination without acute intracranial process, specifically no acute ischemia. Involutional changes. Moderate to severe white matter changes compatible with chronic small vessel ischemic disease. MRA HEAD: Severely motion degraded examination. No definite emergent large vessel occlusion though, limited evaluation. Electronically Signed   By: Elon Alas M.D.   On: 07/12/2015 06:25    2D ECHO: Study Conclusions  - Left  ventricle: The cavity size was normal. Wall thickness was  increased in a pattern of mild LVH.  Systolic function was normal.  The estimated ejection fraction was in the range of 60% to 65%.  Wall motion was normal; there were no regional wall motion  abnormalities. Doppler parameters are consistent with abnormal  left ventricular relaxation (grade 1 diastolic dysfunction).   Disposition and Follow-up: Discharge Instructions    Diet Carb Modified    Complete by:  As directed      Increase activity slowly    Complete by:  As directed             DISPOSITION: home    DISCHARGE FOLLOW-UP Follow-up Information    Follow up with Eliezer Lofts, MD. Schedule an appointment as soon as possible for a visit in 2 weeks.   Specialty:  Family Medicine   Why:  for hospital follow-up   Contact information:   [place] [address] [phone]       Follow up with Xu,Jindong, MD. Schedule an appointment as soon as possible for a visit in 4 weeks.   Specialty:  Neurology   Why:  for hospital follow-up   Contact information:   [address] [place] [address] [phone]        Time spent on Discharge: 46mins   Signed:   Minami Arriaga M.D. [facility] 07/12/2015, 1:49 PM Pager: [phone]

## 2015-07-12 NOTE — Progress Notes (Signed)
VASCULAR LAB PRELIMINARY  PRELIMINARY  PRELIMINARY  PRELIMINARY  Carotid duplex completed.    Preliminary report:   Right: 40-59% ICA stenosis, highest end of scale, however, velocities may be higher as patient was constantly moving.  Left: 40-59% ICA stenosis, low end of range.  Vertebral artery flow is antegrade.    Harjit Douds, RVT 07/12/2015, 10:32 AM

## 2015-07-12 NOTE — H&P (Signed)
History and Physical    [name] [id number] DOB: 07/25/1935 DOA: 07/11/2015  Referring MD/NP/PA: Antonietta Breach, PA PCP: Eliezer Lofts, MD  Patient coming from: Home   Chief Complaint: slurred speech   HPI: Jaclyn [name] is a 80 y.o. female with medical history significant of HTN, HLD, R. ICA stenosis, TIA, hypothyroidism,  atrial fibrillation on chronic anticoagulation, and diabetes mellitus type 2;  who presents with complaints of slurred speech with left-sided facial droop and weakness. Symptoms started approximately 9:15 PM last night and were initially noted to have resolved within 15-20 minutes. Son was present during this time and notes that the patient had left-sided facial droop and was talking as if she were drunk. She denies having any difficulty in word finding. She could not stand up and felt as though her left upper and lower extremities would not maneuver right. Apparently, she had ran out of her Eliquis 6 days ago and had been intermittently taking doses until she refilled her prescription yesterday night. Patient notes having similar symptoms like this back in September 2016 for which she was evaluated and noted to have right internal carotid artery stent stenosis that was unchanged. She notes pain previously told that she had 70% blockage of the right internal carotid artery. Patient also complains of uncontrolled diabetes mellitus type 2 for which she has frequent fluctuations in her blood glucose levels.  ED Course: Upon admission patient was noted to have vital signs all within normal limits. Lab work was relatively unremarkable except for blood glucose of 410. CT of the brain showed chronic small vessel ischemic changes with no acute signs of hemorrhage or ischemia. Code stroke was not called as symptoms were reported initially to have been completely resolved. TRH consulted to admit.  Review of Systems: As per HPI otherwise 10 point review of systems negative.   Past  Medical History  Diagnosis Date  . Unspecified essential hypertension   . Other chest pain   . Atrial fibrillation ([place])   . Cerebrovascular disease, unspecified   . Other and unspecified hyperlipidemia   . Type II or unspecified type diabetes mellitus without mention of complication, not stated as uncontrolled   . Unspecified hypothyroidism   . Diverticulosis of colon (without mention of hemorrhage)   . Benign neoplasm of colon   . Other specified benign mammary dysplasias   . Urinary tract infection, site not specified   . Osteoarthrosis, unspecified whether generalized or localized, unspecified site   . Myalgia and myositis, unspecified   . Unspecified vitamin D deficiency   . Headache(784.0)   . Dysthymic disorder   . Insomnia, unspecified   . Carotid bruit   . Carotid artery occlusion     60-79% right ICA stenosis  . Fibromyalgia   . Interstitial cystitis   . Difficult intubation 2008    During surgery to remove large polyp  . TIA (transient ischemic attack)     Past Surgical History  Procedure Laterality Date  . Vesicovaginal fistula closure w/ tah  1990    w/ cystocele &retocele repairs Dr. Ree Edman  . Right colectomy  2005    for villous adenoma of the cecum Dr.streck  . Cardiac catheterization  02/19/06    EF 60%  . Colonoscopy    . Abdominal hysterectomy  1988    cervical dysplasia     reports that she has never smoked. She has never used smokeless tobacco. She reports that she does not drink alcohol or use illicit drugs.  Allergies  Allergen Reactions  . Naproxen Sodium Other (See Comments)    Fever/aches and pains  . Statins     myalgias  . Sulfonamide Derivatives Hives and Itching  . Latex Itching and Rash  . Shellfish Allergy Itching, Swelling and Rash    Seafood, shrimp    Family History  Problem Relation Age of Onset  . Lymphoma Father   . Hypertension Father   . Stroke Father   . Hyperlipidemia Father   . Hypertension Mother   . Allergies  Brother   . Hypertension Sister     3  . Breast cancer Sister   . Fibromyalgia Sister     3  . Hyperlipidemia Sister     3    Prior to Admission medications   Medication Sig Start Date End Date Taking? Authorizing Provider  carvedilol (COREG) 6.25 MG tablet TAKE ONE TABLET BY MOUTH TWICE DAILY 05/28/15  Yes Josue Hector, MD  DULoxetine (CYMBALTA) 30 MG capsule TAKE TWO CAPSULES BY MOUTH ONCE DAILY Patient taking differently: Take 1 capsule twice daily. 04/18/15  Yes Amy E Bedsole, MD  ELIQUIS 5 MG TABS tablet TAKE ONE TABLET BY MOUTH TWICE DAILY 07/02/15  Yes Amy E Bedsole, MD  glipiZIDE (GLUCOTROL) 10 MG tablet TAKE 1 TABLET (10 MG TOTAL) BY MOUTH DAILY BEFORE BREAKFAST. 04/05/15  Yes Amy E Bedsole, MD  LANTUS SOLOSTAR 100 UNIT/ML Solostar Pen INJECT 65 UNITS INTO THE SKIN AT BEDTIME. Patient taking differently: INJECT 75 UNITS INTO THE SKIN AT BEDTIME. 12/14/14  Yes Amy E Bedsole, MD  levothyroxine (SYNTHROID, LEVOTHROID) 112 MCG tablet TAKE 1 TABLET (112 MCG TOTAL) BY MOUTH DAILY. 03/16/15  Yes Amy Cletis [place], MD  metFORMIN (GLUCOPHAGE-XR) 500 MG 24 hr tablet TAKE TWO TABLETS BY MOUTH ONCE DAILY WITH BREAKFAST 03/23/15  Yes Amy E Bedsole, MD  methocarbamol (ROBAXIN) 500 MG tablet Take 1 tablet (500 mg total) by mouth every 8 (eight) hours as needed for muscle spasms. 05/04/13  Yes Noralee Space, MD  quinapril (ACCUPRIL) 40 MG tablet Take 40 mg by mouth at bedtime.   Yes Historical Provider, MD  trimethoprim (TRIMPEX) 100 MG tablet Take 1 tablet (100 mg total) by mouth daily. For UTI prophylaxsis 04/26/15  Yes Amy Cletis [place], MD    Physical Exam: Filed Vitals:   07/11/15 2345 07/12/15 0000 07/12/15 0230 07/12/15 0300  BP: 114/71 131/66 134/62 111/64  Pulse: 87 82 78 77  Temp:      TempSrc:      Resp: 18 20 18 18   SpO2: 97% 95% 97% 96%      Constitutional: NAD, calm, comfortable Filed Vitals:   07/11/15 2345 07/12/15 0000 07/12/15 0230 07/12/15 0300  BP: 114/71 131/66 134/62 111/64   Pulse: 87 82 78 77  Temp:      TempSrc:      Resp: 18 20 18 18   SpO2: 97% 95% 97% 96%   Eyes: PERRL, lids and conjunctivae normal ENMT: Mucous membranes are moist. Posterior pharynx clear of any exudate or lesions.Normal dentition.  Neck: normal, supple, no masses, no thyromegaly Respiratory: clear to auscultation bilaterally, no wheezing, no crackles. Normal respiratory effort. No accessory muscle use.  Cardiovascular: Regular rate and rhythm, no murmurs / rubs / gallops. No extremity edema. 2+ pedal pulses. No carotid bruits.  Abdomen: no tenderness, no masses palpated. No hepatosplenomegaly. Bowel sounds positive.  Musculoskeletal: no clubbing / cyanosis. No joint deformity upper and lower extremities. Good ROM, no contractures. Normal muscle tone.  Skin:  no rashes, lesions, ulcers. No induration Neurologic: CN 2-12 grossly intact. Sensation intact, DTR normal. Strength 5/5 in all 4. Speech still appears to be slightly slurred at times  Psychiatric: Normal judgment and insight. Alert and oriented x 3. Normal mood.     Labs on Admission: I have personally reviewed following labs and imaging studies  CBC:  Recent Labs Lab 07/11/15 2313 07/11/15 2332  WBC 5.2  --   NEUTROABS 3.5  --   HGB 11.7* 12.9  HCT 36.1 38.0  MCV 93.8  --   PLT 268  --    Basic Metabolic Panel:  Recent Labs Lab 07/11/15 2313 07/11/15 2332  NA 137 139  K 4.7 4.3  CL 102 100*  CO2 25  --   GLUCOSE 410* 394*  BUN 12 14  CREATININE 0.94 0.80  CALCIUM 9.5  --    GFR: CrCl cannot be calculated (Unknown ideal weight.). Liver Function Tests:  Recent Labs Lab 07/11/15 2313  AST 45*  ALT 47  ALKPHOS 86  BILITOT 0.7  PROT 6.5  ALBUMIN 3.4*   No results for input(s): LIPASE, AMYLASE in the last 168 hours. No results for input(s): AMMONIA in the last 168 hours. Coagulation Profile:  Recent Labs Lab 07/11/15 2313  INR 1.08   Cardiac Enzymes: No results for input(s): CKTOTAL, CKMB,  CKMBINDEX, TROPONINI in the last 168 hours. BNP (last 3 results) No results for input(s): PROBNP in the last 8760 hours. HbA1C: No results for input(s): HGBA1C in the last 72 hours. CBG: No results for input(s): GLUCAP in the last 168 hours. Lipid Profile: No results for input(s): CHOL, HDL, LDLCALC, TRIG, CHOLHDL, LDLDIRECT in the last 72 hours. Thyroid Function Tests: No results for input(s): TSH, T4TOTAL, FREET4, T3FREE, THYROIDAB in the last 72 hours. Anemia Panel: No results for input(s): VITAMINB12, FOLATE, FERRITIN, TIBC, IRON, RETICCTPCT in the last 72 hours. Urine analysis:    Component Value Date/Time   COLORURINE YELLOW 07/11/2015 2318   APPEARANCEUR TURBID* 07/11/2015 2318   LABSPEC 1.031* 07/11/2015 2318   PHURINE 6.0 07/11/2015 2318   GLUCOSEU >1000* 07/11/2015 2318   GLUCOSEU NEGATIVE 10/08/2012 1148   HGBUR NEGATIVE 07/11/2015 2318   BILIRUBINUR NEGATIVE 07/11/2015 2318   BILIRUBINUR negative 04/11/2015 1733   BILIRUBINUR negative 11/09/2014 1631   KETONESUR 15* 07/11/2015 2318   KETONESUR negative 11/09/2014 1631   PROTEINUR NEGATIVE 07/11/2015 2318   PROTEINUR trace 04/11/2015 1733   PROTEINUR negative 11/09/2014 1631   UROBILINOGEN 0.2 04/11/2015 1733   UROBILINOGEN 0.2 11/01/2014 2113   NITRITE NEGATIVE 07/11/2015 2318   NITRITE positive 04/11/2015 1733   NITRITE Negative 11/09/2014 1631   LEUKOCYTESUR SMALL* 07/11/2015 2318   Sepsis Labs: No results found for this or any previous visit (from the past 240 hour(s)).   Radiological Exams on Admission: Ct Head Wo Contrast  07/12/2015  CLINICAL DATA:  Stroke-like symptoms. Left facial droop and weakness. EXAM: CT HEAD WITHOUT CONTRAST TECHNIQUE: Contiguous axial images were obtained from the base of the skull through the vertex without intravenous contrast. COMPARISON:  CT 11/01/2014 FINDINGS: No intracranial hemorrhage. Moderate atrophy and chronic small vessel ischemic change, no CT findings of  superimposed acute ischemia. No definite hyperdense vessel; symmetric appearance of proximal MCA branches. No evidence of insular ribbon. No subdural or extra-axial fluid collection. The calvarium is intact. Included paranasal sinuses and mastoid air cells are well aerated. IMPRESSION: Unchanged atrophy and chronic small vessel ischemic change. No hemorrhage or CT findings of acute ischemia. Electronically  Signed   By: Jeb Levering M.D.   On: 07/12/2015 01:05    EKG: Independently reviewed. Sinus rhythm with right ventricular hypertrophy  Assessment/Plan Left-sided weakness and slurred speech question of TIA (transient ischemic attack)/stroke: Acute. Patient reports left sided facial droop with weakness.  - Admit to telemetry - Check MRI/MRA, echocardiogram, Vascular duplex doppler of carotids  - Check hemoglobin A1c and lipid panel - consider need of consult neurology/ vascular surgery if needed in a.m.  Paroxysmal Atrial fibrillation on Elquis: chadvasc score = 7  - Continue Eliquis - Counseled patient on importance of not disrupting Eliquis dosing   Diabetes mellitus type 2 uncontrolled: Initial blood glucose elevated at 410 on admission.  - Hold metformin and glyburide  - Continue Lantus 75 units at night  - CBGs every 4 hours with sensitive sliding scale insulin for now  Essential hypertension - Continue quinapril   Hypothyroidism - continue levothyroxine  Fibromyalgia  - Continue duloxetine   DVT prophylaxis: on Eliquis Code Status: Full Family Communication: Discussed plan with the patient's son is present at bedside Disposition Plan: Possible discharge home in 1-2 days Consults called:  none Admission status: Observation telemetry  Norval Morton MD [facility] Pager [phone]  If 7PM-7AM, please contact night-coverage [email or web address] Password [facility]  07/12/2015, 3:23 AM

## 2015-07-13 ENCOUNTER — Other Ambulatory Visit: Payer: Self-pay | Admitting: Neurology

## 2015-07-13 ENCOUNTER — Telehealth: Payer: Self-pay | Admitting: *Deleted

## 2015-07-13 DIAGNOSIS — G458 Other transient cerebral ischemic attacks and related syndromes: Secondary | ICD-10-CM

## 2015-07-13 LAB — HEMOGLOBIN A1C
HEMOGLOBIN A1C: 10.1 % — AB (ref 4.8–5.6)
Mean Plasma Glucose: 243 mg/dL

## 2015-07-13 NOTE — Telephone Encounter (Signed)
Transition Care Management Follow-up Telephone Call   Date discharged? 07/12/15   How have you been since you were released from the hospital? Doing well, improving.   Do you understand why you were in the hospital? yes   Do you understand the discharge instructions? yes   Where were you discharged to? Home   Items Reviewed:  Medications reviewed: yes  Allergies reviewed: yes  Dietary changes reviewed: no  Referrals reviewed: neurology   Functional Questionnaire:   Activities of Daily Living (ADLs):   She states they are independent in the following: ambulation, bathing and hygiene, feeding, continence, grooming, toileting and dressing States they require assistance with the following: back to baseline   Any transportation issues/concerns?: will arrange transportation with son and daughter in-law   Any patient concerns? yes, new medication - Zetia, lumbar pain, decrease in vision - blurred vision   Confirmed importance and date/time of follow-up visits scheduled yes, 07/17/15 @ 1330  Provider Appointment booked with Eliezer Lofts, MD  Confirmed with patient if condition begins to worsen call PCP or go to the ER.  Patient was given the office number and encouraged to call back with question or concerns.  : yes

## 2015-07-17 ENCOUNTER — Encounter: Payer: Self-pay | Admitting: Family Medicine

## 2015-07-17 ENCOUNTER — Ambulatory Visit (INDEPENDENT_AMBULATORY_CARE_PROVIDER_SITE_OTHER): Payer: Medicare Other | Admitting: Family Medicine

## 2015-07-17 VITALS — BP 171/88 | HR 84 | Temp 98.4°F | Ht 62.0 in | Wt 161.5 lb

## 2015-07-17 DIAGNOSIS — G459 Transient cerebral ischemic attack, unspecified: Secondary | ICD-10-CM | POA: Diagnosis not present

## 2015-07-17 DIAGNOSIS — Z5189 Encounter for other specified aftercare: Secondary | ICD-10-CM | POA: Diagnosis not present

## 2015-07-17 DIAGNOSIS — E1165 Type 2 diabetes mellitus with hyperglycemia: Secondary | ICD-10-CM

## 2015-07-17 DIAGNOSIS — I10 Essential (primary) hypertension: Secondary | ICD-10-CM

## 2015-07-17 DIAGNOSIS — E785 Hyperlipidemia, unspecified: Secondary | ICD-10-CM

## 2015-07-17 DIAGNOSIS — I6521 Occlusion and stenosis of right carotid artery: Secondary | ICD-10-CM

## 2015-07-17 NOTE — Assessment & Plan Note (Addendum)
Inadequate control. Pt forgot to take medication today. Follow BP at home.

## 2015-07-17 NOTE — Assessment & Plan Note (Signed)
Inadequate control, worsening. Reviewed diet changes, poor compliance. Increase insulin to 85 units daily. Pt to measure blood sugars fasting daily and call with measurements in 1-2 weeks.

## 2015-07-17 NOTE — Progress Notes (Signed)
Subjective:    Patient ID: Jaclyn [name], female    DOB: December 26, 1935, 80 y.o.   MRN: [id number]  HPI  80 year old female with history of carotid stenosis, high cholesterol,l afib recently noncompliant with anticoagulation, diabetes and hypertension present following admission to hospital on 5/24 for TIA.  Discharged 5/25. Transitional care call made.  She was admitted with Left-sided weakness and slurred speech question of TIA (transient ischemic attack)/stroke: Acute. Patient reported left sided facial droop with weakness.   Neurology was consulted. - MRI brain showed no acute ischemia, moderate to severe white matter changes compatible with chronic small vessel ischemic disease. - 2-D echo showed EF 123456, grade 1 diastolic dysfunction, no regional wall motion abnormalities - Carotid Dopplers showed 40-59% stenosis on the right ICA, left 40-59% ICA stenosis.  - Discussed with neurology, recommended REVAL OF CAROTIDS GIVEN ELEVATION HIGHER IN PAST, patient recommended to control diabetes, secondary stroke prevention - Lipid panel showed LDL 128, goal less than 70, patient is statin intolerant. Placed on Zetia - Hemoglobin A1c 8.6 in 12/16,  Lab Results  Component Value Date   HGBA1C 10.1* 07/12/2015    Paroxysmal Atrial fibrillation on Elquis: chadvasc score = 7  - Continue Eliquis - Counseled patient on importance of not disrupting Eliquis dosing   Diabetes mellitus type 2 uncontrolled: Initial blood glucose elevated at 410 on admission.  - inadequate control on metformin, glipizide and lantus.   Essential hypertension - Continue quinapril   Hypothyroidism - continue levothyroxine  Fibromyalgia  - Continue duloxetine  Today pt reports: Resolution of weakness and slurred speech.  She feels she does still have some vision changes in right eye, cannot see as well as she could before this last episode.  Home health PT supposed to come out.  She is having trouble  paying for her meds. She is in donut hole. Not checking blood sugars.   She may have missed BP med this morning! BP Readings from Last 3 Encounters:  07/17/15 171/88  07/12/15 113/63  05/07/15 140/70      Social History /Family History/Past Medical History reviewed and updated if needed.   Review of Systems  Constitutional: Negative for fever and fatigue.  HENT: Negative for ear pain.   Eyes: Negative for pain.  Respiratory: Negative for chest tightness and shortness of breath.   Cardiovascular: Negative for chest pain, palpitations and leg swelling.  Gastrointestinal: Negative for abdominal pain.  Genitourinary: Negative for dysuria.       Objective:   Physical Exam  Constitutional: Vital signs are normal. She appears well-developed and well-nourished. She is cooperative.  Non-toxic appearance. She does not appear ill. No distress.  Elderly female hard of hearing  HENT:  Head: Normocephalic.  Right Ear: Hearing, tympanic membrane, external ear and ear canal normal. Tympanic membrane is not erythematous, not retracted and not bulging.  Left Ear: Hearing, tympanic membrane, external ear and ear canal normal. Tympanic membrane is not erythematous, not retracted and not bulging.  Nose: No mucosal edema or rhinorrhea. Right sinus exhibits no maxillary sinus tenderness and no frontal sinus tenderness. Left sinus exhibits no maxillary sinus tenderness and no frontal sinus tenderness.  Mouth/Throat: Uvula is midline, oropharynx is clear and moist and mucous membranes are normal.  Eyes: Conjunctivae, EOM and lids are normal. Pupils are equal, round, and reactive to light. Lids are everted and swept, no foreign bodies found.  Neck: Trachea normal and normal range of motion. Neck supple. Carotid bruit is not present. No  thyroid mass and no thyromegaly present.  Cardiovascular: Normal rate, regular rhythm, S1 normal, S2 normal, normal heart sounds, intact distal pulses and normal pulses.   Exam reveals no gallop and no friction rub.   No murmur heard. Pulmonary/Chest: Effort normal and breath sounds normal. No tachypnea. No respiratory distress. She has no decreased breath sounds. She has no wheezes. She has no rhonchi. She has no rales.  Abdominal: Soft. Normal appearance and bowel sounds are normal. There is no tenderness.  Neurological: She is alert.  Skin: Skin is warm, dry and intact. No rash noted.  Psychiatric: Her speech is normal and behavior is normal. Judgment and thought content normal. Her mood appears not anxious. Cognition and memory are normal. She does not exhibit a depressed mood.          Assessment & Plan:

## 2015-07-17 NOTE — Assessment & Plan Note (Signed)
Risk factor reduction. Pt encouraged to stay on anticoagulation regualrly. Will refer to [facility] for medication assistance.

## 2015-07-17 NOTE — Assessment & Plan Note (Signed)
In hospital 2017.Marland Kitchen Lower percentage stenosis. Neurohospitalist Dr. Tobias Alexander recommended repeat.. Not clear when. Will defer to neuro/cardiology.

## 2015-07-17 NOTE — Progress Notes (Signed)
Pre visit review using our clinic review tool, if applicable. No additional management support is needed unless otherwise documented below in the visit note. 

## 2015-07-17 NOTE — Patient Instructions (Addendum)
Call to make follow up with Neurology , Dr. Posey Pronto in next month for follow up TIA/CVA.  Start zetia, let me know if to costly.  Work on low [employer]. Avoid juice.. Try crystal light instead. Decrease bread pasta, potatos rice. Avoid white, higher fiber.. Like sweet potatos or whole wheat.  Increase Lantus to 85 Units daily at bedtime.  Make sure to take BP medications daily.  Check fasting blood sugar daily.  Follow blood pressure at home, call with measurements in 1-2 weeks.

## 2015-07-17 NOTE — Assessment & Plan Note (Signed)
Now on zetia as intolerant to statins.  Recheck in 3 months.

## 2015-07-31 ENCOUNTER — Telehealth: Payer: Self-pay | Admitting: Family Medicine

## 2015-07-31 NOTE — Telephone Encounter (Signed)
Notify pt that the neurologist she saw in hospital recommended repeating a carotid doppler again given it may have been inaccurate. Is she willing to do this? I will send in an order if agreeable. Also has she made a follow up with the neurologist yet?

## 2015-07-31 NOTE — Telephone Encounter (Signed)
Jaclyn [name] notified  as instructed by telephone. She states she can't afford to keep having all these test done.  She states her hospital bill was $12,000 and she is having to pay a lot out of pocket for her medications due to her being in the doughnut hole.  She did finally agree to repeating the carotid doppler study only if is was done at Dr. Kyla Balzarine office.  She had not called yet to schedule her follow up appointment with her neurologist but she states she will.

## 2015-07-31 NOTE — Telephone Encounter (Signed)
-----   Message from Greta Doom, MD sent at 07/17/2015  8:41 PM EDT ----- Regarding: RE: Carotid stenosis I would repeat it now, the tech notes that she was moving slightly which may have affected the reading and she had previous dopplers showing higher readings. If there is a chance that it is > 70% then I would refer her to vascular surgery.   Roland Rack   ----- Message -----    From: Jinny Sanders, MD    Sent: 07/17/2015   9:40 AM      To: Greta Doom, MD Subject: RE: Carotid stenosis                           I want to make sure I understand your recommendations.. Repeat the carotid US again now or in 3-6 months?  Thanks, Eliezer Lofts MD ----- Message -----    From: Greta Doom, MD    Sent: 07/13/2015   9:12 PM      To: Jinny Sanders, MD Subject: Carotid stenosis                               Dr. Diona Browner,  I am a neurohospitalist and saw Ms. Bertelsen as an inpatient for TIA which I suspect is due to medication non-compliance and atrial fibrillation.   During that hospitalization, she had carotid dopplers performed which were read as 40 - 59% stenosis which is not something that I got excited about.   After her discharge when I was going through some notes of my PA, I noticed that in previous encounters she had been read as high a 60 - 79% which would be enough for me to refer her to vascular surgery.   I don't think that this is emergent given that she was non-complaint and now is back on medical management, but I see that she has an appointment with you in a few days. Would you be able to re-order the dopplers and refer her to vascular if there is a possibility of > 70% stenosis?  I am arranging outpatient neurology follow up as well, but suspect it is going to take longer to get her in.   Please let me know,  Roland Rack

## 2015-07-31 NOTE — Telephone Encounter (Signed)
She has low velocities 40-59% ICA by last duplex can wait a year for another one

## 2015-07-31 NOTE — Telephone Encounter (Signed)
I will forward the neurohospitalist's recommendations and notes below to [name]. Given he has been following her carotid dopplers over time and given pt hesitant to repeat this again so soon.... I will ask him make the decision to repeat now or in 6 months.

## 2015-08-02 NOTE — Telephone Encounter (Signed)
Left message for Ms. Guglielmo that per [name] we do not need to repeat her carotid dopplers for another year.

## 2015-09-12 ENCOUNTER — Encounter: Payer: Self-pay | Admitting: Internal Medicine

## 2015-09-14 ENCOUNTER — Encounter: Payer: Self-pay | Admitting: Internal Medicine

## 2015-09-14 ENCOUNTER — Encounter (INDEPENDENT_AMBULATORY_CARE_PROVIDER_SITE_OTHER): Payer: Self-pay

## 2015-09-14 ENCOUNTER — Ambulatory Visit (INDEPENDENT_AMBULATORY_CARE_PROVIDER_SITE_OTHER): Payer: Medicare Other | Admitting: Internal Medicine

## 2015-09-14 VITALS — BP 124/64 | HR 82 | Ht 62.5 in | Wt 165.0 lb

## 2015-09-14 DIAGNOSIS — I4891 Unspecified atrial fibrillation: Secondary | ICD-10-CM

## 2015-09-14 NOTE — Progress Notes (Signed)
HPI Jaclyn [name] is referred today for consideration for ILR insertion. She is a pleasant 80 yo woman with 2 or 3 neurological events in the past. She carries a diagnosis of atrial fib but this is not well characterized by our records. Atrial fib was apparently seen at [facility]. She has been placed on Eliquis and has had no bleeding problems. The patient has a h/o DM and HTN . She has not had any bleeding.  Allergies  Allergen Reactions  . Naproxen Sodium Other (See Comments)    Fever/aches and pains  . Statins     myalgias  . Sulfonamide Derivatives Hives and Itching  . Latex Itching and Rash  . Shellfish Allergy Itching, Swelling and Rash    Seafood, shrimp     Current Outpatient Prescriptions  Medication Sig Dispense Refill  . carvedilol (COREG) 6.25 MG tablet TAKE ONE TABLET BY MOUTH TWICE DAILY 180 tablet 3  . DULoxetine (CYMBALTA) 30 MG capsule TAKE TWO CAPSULES BY MOUTH ONCE DAILY (Patient taking differently: Take 1 capsule twice daily.) 60 capsule 5  . ELIQUIS 5 MG TABS tablet TAKE ONE TABLET BY MOUTH TWICE DAILY 60 tablet 2  . glipiZIDE (GLUCOTROL) 10 MG tablet TAKE 1 TABLET (10 MG TOTAL) BY MOUTH DAILY BEFORE BREAKFAST. 90 tablet 1  . Insulin Glargine (LANTUS SOLOSTAR) 100 UNIT/ML Solostar Pen INJECT 75 UNITS INTO THE SKIN AT BEDTIME. 15 pen 5  . levothyroxine (SYNTHROID, LEVOTHROID) 112 MCG tablet TAKE 1 TABLET (112 MCG TOTAL) BY MOUTH DAILY. 30 tablet 11  . metFORMIN (GLUCOPHAGE-XR) 500 MG 24 hr tablet TAKE TWO TABLETS BY MOUTH ONCE DAILY WITH BREAKFAST 60 tablet 5  . methocarbamol (ROBAXIN) 500 MG tablet Take 1 tablet (500 mg total) by mouth every 8 (eight) hours as needed for muscle spasms. 90 tablet 1  . trimethoprim (TRIMPEX) 100 MG tablet Take 1 tablet (100 mg total) by mouth daily. For UTI prophylaxsis 30 tablet 11  . ezetimibe (ZETIA) 10 MG tablet Take 1 tablet (10 mg total) by mouth daily. (Patient not taking: Reported on 09/14/2015) 30 tablet 3   No current  facility-administered medications for this visit.      Past Medical History:  Diagnosis Date  . Atrial fibrillation ([place])   . Benign neoplasm of colon   . Carotid artery occlusion    60-79% right ICA stenosis  . Carotid bruit   . Cerebrovascular disease, unspecified   . Difficult intubation 2008   During surgery to remove large polyp  . Diverticulosis of colon (without mention of hemorrhage)   . Dysthymic disorder   . Fibromyalgia   . Headache(784.0)   . Insomnia, unspecified   . Interstitial cystitis   . Myalgia and myositis, unspecified   . Osteoarthrosis, unspecified whether generalized or localized, unspecified site   . Other and unspecified hyperlipidemia   . Other chest pain   . Other specified benign mammary dysplasias   . TIA (transient ischemic attack)   . Type II or unspecified type diabetes mellitus without mention of complication, not stated as uncontrolled   . Unspecified essential hypertension   . Unspecified hypothyroidism   . Unspecified vitamin D deficiency   . Urinary tract infection, site not specified     ROS:   All systems reviewed and negative except as noted in the HPI.   Past Surgical History:  Procedure Laterality Date  . ABDOMINAL HYSTERECTOMY  1988   cervical dysplasia  . CARDIAC CATHETERIZATION  02/19/06   EF  60%  . COLONOSCOPY    . RIGHT COLECTOMY  2005   for villous adenoma of the cecum Dr.streck  . VESICOVAGINAL FISTULA CLOSURE W/ TAH  1990   w/ cystocele &retocele repairs Dr. Ree Edman     Family History  Problem Relation Age of Onset  . Lymphoma Father   . Hypertension Father   . Stroke Father   . Hyperlipidemia Father   . Hypertension Mother   . Allergies Brother   . Hypertension Sister     3  . Breast cancer Sister   . Fibromyalgia Sister     3  . Hyperlipidemia Sister     3     Social History   Social History  . Marital status: Single    Spouse name: N/A  . Number of children: 3  . Years of education: N/A    Occupational History  . retired Retired   Social History Main Topics  . Smoking status: Never Smoker  . Smokeless tobacco: Never Used  . Alcohol use No  . Drug use: No  . Sexual activity: Not on file   Other Topics Concern  . Not on file   Social History Narrative   1 child died at 18 months old --hx of downs syndrome  Lives with son in a 2 story home.  Uses the 1st floor.  Has two living children.  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       BP 124/64   Pulse 82   Ht 5' 2.5" (1.588 m)   Wt 165 lb (74.8 kg)   BMI 29.70 kg/m   Physical Exam:  Well appearing 80 yo woman, NAD HEENT: Unremarkable Neck:  6 cm JVD, no thyromegally Lymphatics:  No adenopathy Back:  No CVA tenderness Lungs:  Clear with no wheezes HEART:  Regular rate rhythm, no murmurs, no rubs, no clicks Abd:  soft, positive bowel sounds, no organomegally, no rebound, no guarding Ext:  2 plus pulses, no edema, no cyanosis, no clubbing Skin:  No rashes no nodules Neuro:  CN II through XII intact, motor grossly intact  EKG - nsr  Assess/Plan: 1. Atrial fib - while her atrial fib is not well characterized, I must assume that her diagnosis is correct. For this reason I would not be willing to stop her anti-coagulation unless she developed bleeding problems.  2. Stroke/TIA - as she has had several neuro events and because she has a diagnosis of atrial fib, no indication to place an ILR. Even if she went 3 years on an ILR with no atrial fib, I would not be willing to stop her anti-coagulation unless she developed life threatening bleeding. 3. HTN - her blood pressure today is good. No change in her meds. 4. Carotid vascular disease - she will undergo repeat u/s of the neck in the next few months.  Mikle Bosworth.D.

## 2015-09-14 NOTE — Patient Instructions (Addendum)
Medication Instructions:  Your physician recommends that you continue on your current medications as directed. Please refer to the Current Medication list given to you today.  Labwork: None ordered.  Testing/Procedures: None ordered.  Follow-Up: Your physician recommends that you schedule a follow-up appointment as needed.   Any Other Special Instructions Will Be Listed Below (If Applicable).     If you need a refill on your cardiac medications before your next appointment, please call your pharmacy.   

## 2015-09-24 ENCOUNTER — Other Ambulatory Visit: Payer: Self-pay

## 2015-10-15 ENCOUNTER — Other Ambulatory Visit: Payer: Self-pay | Admitting: Family Medicine

## 2015-10-15 NOTE — Telephone Encounter (Signed)
Pt left v/m to verify refills are done on meds requested from [place]; spoke with Jasmine at [address] and refills will be ready in 2 hrs. Left v/m per DPR of above info.

## 2015-10-25 ENCOUNTER — Telehealth: Payer: Self-pay | Admitting: Family Medicine

## 2015-10-25 DIAGNOSIS — E785 Hyperlipidemia, unspecified: Secondary | ICD-10-CM

## 2015-10-25 DIAGNOSIS — E1165 Type 2 diabetes mellitus with hyperglycemia: Secondary | ICD-10-CM

## 2015-10-25 DIAGNOSIS — E559 Vitamin D deficiency, unspecified: Secondary | ICD-10-CM

## 2015-10-25 DIAGNOSIS — E038 Other specified hypothyroidism: Secondary | ICD-10-CM

## 2015-10-25 NOTE — Telephone Encounter (Signed)
-----   Message from Ellamae Sia sent at 10/17/2015  4:13 PM EDT ----- Regarding: Lab orders for Friday, 9.8.17 Lab orders for a 3 month follow up appt.

## 2015-10-26 ENCOUNTER — Other Ambulatory Visit: Payer: Medicare Other

## 2015-10-31 ENCOUNTER — Other Ambulatory Visit (INDEPENDENT_AMBULATORY_CARE_PROVIDER_SITE_OTHER): Payer: Medicare Other

## 2015-10-31 DIAGNOSIS — E1165 Type 2 diabetes mellitus with hyperglycemia: Secondary | ICD-10-CM | POA: Diagnosis not present

## 2015-10-31 DIAGNOSIS — E785 Hyperlipidemia, unspecified: Secondary | ICD-10-CM

## 2015-11-01 LAB — COMPREHENSIVE METABOLIC PANEL
ALBUMIN: 4.3 g/dL (ref 3.5–5.2)
ALT: 35 U/L (ref 0–35)
AST: 31 U/L (ref 0–37)
Alkaline Phosphatase: 73 U/L (ref 39–117)
BILIRUBIN TOTAL: 0.5 mg/dL (ref 0.2–1.2)
BUN: 18 mg/dL (ref 6–23)
CALCIUM: 9.5 mg/dL (ref 8.4–10.5)
CO2: 25 meq/L (ref 19–32)
CREATININE: 0.94 mg/dL (ref 0.40–1.20)
Chloride: 100 mEq/L (ref 96–112)
GFR: 61.18 mL/min (ref >60.00)
Glucose, Bld: 266 mg/dL — ABNORMAL HIGH (ref 70–99)
Potassium: 4.7 mEq/L (ref 3.5–5.1)
Sodium: 137 mEq/L (ref 135–145)
TOTAL PROTEIN: 7.5 g/dL (ref 6.0–8.3)

## 2015-11-01 LAB — HEMOGLOBIN A1C: HEMOGLOBIN A1C: 9.6 % — AB (ref 4.6–6.5)

## 2015-11-01 LAB — LDL CHOLESTEROL, DIRECT: LDL DIRECT: 145 mg/dL

## 2015-11-01 LAB — LIPID PANEL
CHOLESTEROL: 242 mg/dL — AB (ref 0–200)
HDL: 35.8 mg/dL — ABNORMAL LOW (ref >39.00)
Total CHOL/HDL Ratio: 7
Triglycerides: 574 mg/dL — ABNORMAL HIGH (ref 0.0–149.0)

## 2015-11-02 ENCOUNTER — Ambulatory Visit: Payer: Medicare Other | Admitting: Family Medicine

## 2015-11-09 ENCOUNTER — Other Ambulatory Visit: Payer: Medicare Other

## 2015-11-09 ENCOUNTER — Ambulatory Visit: Payer: Medicare Other | Admitting: Family Medicine

## 2015-11-16 ENCOUNTER — Ambulatory Visit: Payer: Medicare Other | Admitting: Family Medicine

## 2015-11-23 ENCOUNTER — Encounter: Payer: Self-pay | Admitting: Family Medicine

## 2015-11-23 ENCOUNTER — Ambulatory Visit (INDEPENDENT_AMBULATORY_CARE_PROVIDER_SITE_OTHER): Payer: Medicare Other | Admitting: Family Medicine

## 2015-11-23 VITALS — BP 160/85 | HR 79 | Temp 97.4°F | Ht 62.0 in | Wt 167.5 lb

## 2015-11-23 DIAGNOSIS — I10 Essential (primary) hypertension: Secondary | ICD-10-CM | POA: Diagnosis not present

## 2015-11-23 DIAGNOSIS — E785 Hyperlipidemia, unspecified: Secondary | ICD-10-CM | POA: Diagnosis not present

## 2015-11-23 DIAGNOSIS — Z23 Encounter for immunization: Secondary | ICD-10-CM | POA: Diagnosis not present

## 2015-11-23 DIAGNOSIS — I6521 Occlusion and stenosis of right carotid artery: Secondary | ICD-10-CM

## 2015-11-23 DIAGNOSIS — E1165 Type 2 diabetes mellitus with hyperglycemia: Secondary | ICD-10-CM

## 2015-11-23 LAB — HM DIABETES FOOT EXAM

## 2015-11-23 MED ORDER — EZETIMIBE 10 MG PO TABS: 10.0000 mg | ORAL_TABLET | Freq: Every day | ORAL | 11 refills | Status: DC

## 2015-11-23 MED ORDER — METFORMIN HCL ER 500 MG PO TB24: 1500.0000 mg | ORAL_TABLET | Freq: Every day | ORAL | 11 refills | Status: DC

## 2015-11-23 MED ORDER — INSULIN GLARGINE 100 UNIT/ML SOLOSTAR PEN: PEN_INJECTOR | SUBCUTANEOUS | 11 refills | Status: DC

## 2015-11-23 NOTE — Addendum Note (Signed)
Addended by: Carter Kitten on: 11/23/2015 04:35 PM   Modules accepted: Orders

## 2015-11-23 NOTE — Progress Notes (Signed)
Pre visit review using our clinic review tool, if applicable. No additional management support is needed unless otherwise documented below in the visit note. 

## 2015-11-23 NOTE — Progress Notes (Signed)
80 year old female presents for  follow up on DM, HTN and chol.   Hospitalization on 06/2015 for TIA.  Diabetes: Poor control on metformin ( not AT MAX) glipizide and lantus 75 Units.  Never went up 85 Units.  Good compliance with medication.  Eats at 3 AM at night. Stays at night and sleep until 1 PM. Used to work on 3rd shift so is used to this. Lab Results  Component Value Date   HGBA1C 9.6 (H) 10/31/2015  Using medications without difficulties:. See above Hypoglycemic episodes: none Hyperglycemic episodes: yes Feet problems: none Blood Sugars averaging: FBS 200  2 hours after meals  200-300, once had a 450 eye exam within last year:  Diet: Moderate, low carb diet  Exercise:None   Hypertension:  Poor control today on  Coreg 6.25 BP Readings from Last 3 Encounters:  11/23/15 (!) 160/85  09/14/15 124/64  07/17/15 (!) 171/88   Using medication without problems or lightheadedness: None Chest pain with exertion:None Edema:None Short of breath:None Average home BPs:not checking. Other issues:     Elevated Cholesterol:  Very high trigs and LDL not at goal < 70,. She has not started zetia 10.  Lab Results  Component Value Date   CHOL 242 (H) 10/31/2015   HDL 35.80 (L) 10/31/2015   LDLCALC 128 (H) 07/12/2015   LDLDIRECT 145.0 10/31/2015   TRIG (H) 10/31/2015    574.0 Triglyceride is over 400; calculations on Lipids are invalid.   CHOLHDL 7 10/31/2015    SE statin. Using medications without problems: Muscle aches:  Diet compliance: Exercise: walking Other complaints:  Hx of TIA, carotid stenosis: on Eliquis    She also feels depressed given her daughters health issues.  PHQ9    Review of Systems  Constitutional: Positive for fatigue. Negative for fever.  HENT: Negative for ear pain.  Eyes: Negative for pain.  Respiratory: Negative for shortness of breath.  Cardiovascular: Negative for chest pain.  Gastrointestinal: Negative for abdominal  pain.       Objective:  Physical Exam  Constitutional: Vital signs are normal. She appears well-developed and well-nourished. She is cooperative. Non-toxic appearance. She does not appear ill. No distress.  HENT:  Head: Normocephalic.  Right Ear: Hearing, tympanic membrane, external ear and ear canal normal. Tympanic membrane is not erythematous, not retracted and not bulging.  Left Ear: Hearing, tympanic membrane, external ear and ear canal normal. Tympanic membrane is not erythematous, not retracted and not bulging.  Nose: No mucosal edema or rhinorrhea. Right sinus exhibits no maxillary sinus tenderness and no frontal sinus tenderness. Left sinus exhibits no maxillary sinus tenderness and no frontal sinus tenderness.  Mouth/Throat: Uvula is midline, oropharynx is clear and moist and mucous membranes are normal.  Eyes: Conjunctivae, EOM and lids are normal. Pupils are equal, round, and reactive to light. Lids are everted and swept, no foreign bodies found.  Neck: Trachea normal and normal range of motion. Neck supple. Carotid bruit is not present. No mass and no thyromegaly present.  Cardiovascular: Normal rate, regular rhythm, S1 normal, S2 normal, normal heart sounds, intact distal pulses and normal pulses. Exam reveals no gallop and no friction rub.  No murmur heard. Pulmonary/Chest: Effort normal and breath sounds normal. Not tachypneic. No respiratory distress. She has no decreased breath sounds. She has no wheezes. She has no rhonchi. She has no rales.  Abdominal: Soft. Normal appearance and bowel sounds are normal. There is no tenderness.  Skin: Skin is warm, dry and intact.  No rash noted.  Psychiatric: Her speech is normal and behavior is normal. Judgment and thought content normal. Her mood appears not anxious. Cognition and memory are normal. Sh, e does not exhibit a depressed mood.   Diabetic foot exam: Normal inspection No skin breakdown No calluses   Normal DP pulses Slightly decreased sensation to light touch and monofilament in toes. Nails normal

## 2015-11-23 NOTE — Addendum Note (Signed)
Addended by: Carter Kitten on: 11/23/2015 04:49 PM   Modules accepted: Orders

## 2015-11-23 NOTE — Patient Instructions (Addendum)
Increase Lantus to 85 Units daily. Increase metformin to 1500 mg daily. Follow blood sugars at home. Stop at front desk on way out for a referral to endocrinolgist.  Call if mood is improving in next few weeks, we can consider increasing medication or seeing counselor.  Start zetia now. Have yearly eye exam.

## 2015-11-27 ENCOUNTER — Other Ambulatory Visit: Payer: Self-pay | Admitting: Family Medicine

## 2015-12-10 ENCOUNTER — Encounter: Payer: Self-pay | Admitting: Endocrinology

## 2015-12-15 ENCOUNTER — Other Ambulatory Visit: Payer: Self-pay | Admitting: Family Medicine

## 2016-01-07 ENCOUNTER — Other Ambulatory Visit: Payer: Self-pay | Admitting: Family Medicine

## 2016-02-14 ENCOUNTER — Ambulatory Visit: Payer: Medicare Other | Admitting: Internal Medicine

## 2016-03-20 ENCOUNTER — Ambulatory Visit: Payer: Medicare Other | Admitting: Internal Medicine

## 2016-04-07 ENCOUNTER — Encounter: Payer: Self-pay | Admitting: Family Medicine

## 2016-04-07 ENCOUNTER — Ambulatory Visit (INDEPENDENT_AMBULATORY_CARE_PROVIDER_SITE_OTHER): Payer: Medicare Other | Admitting: Family Medicine

## 2016-04-07 VITALS — BP 110/80 | HR 77 | Temp 98.1°F | Ht 62.0 in | Wt 171.8 lb

## 2016-04-07 DIAGNOSIS — R2689 Other abnormalities of gait and mobility: Secondary | ICD-10-CM

## 2016-04-07 DIAGNOSIS — M797 Fibromyalgia: Secondary | ICD-10-CM

## 2016-04-07 DIAGNOSIS — Z87440 Personal history of urinary (tract) infections: Secondary | ICD-10-CM

## 2016-04-07 DIAGNOSIS — G459 Transient cerebral ischemic attack, unspecified: Secondary | ICD-10-CM

## 2016-04-07 DIAGNOSIS — N3000 Acute cystitis without hematuria: Secondary | ICD-10-CM | POA: Diagnosis not present

## 2016-04-07 DIAGNOSIS — E038 Other specified hypothyroidism: Secondary | ICD-10-CM | POA: Diagnosis not present

## 2016-04-07 DIAGNOSIS — E1165 Type 2 diabetes mellitus with hyperglycemia: Secondary | ICD-10-CM | POA: Diagnosis not present

## 2016-04-07 MED ORDER — CEPHALEXIN 500 MG PO CAPS: 500.0000 mg | ORAL_CAPSULE | Freq: Two times a day (BID) | ORAL | 0 refills | Status: DC

## 2016-04-07 NOTE — Assessment & Plan Note (Signed)
Chronic, uncontrolled. Update A1c.  Has endo appt next month.

## 2016-04-07 NOTE — Assessment & Plan Note (Addendum)
Difficult historian. Anticipate recurrent UTI in h/o same. Pt endorses noted improvement since starting left over trimethoprim. Unable to provide sample. Will check CBC, BMP today. Start keflex for presumed UTI. I asked pt/son to collect urine sample at home prior to starting keflex then store in [place], and bring in tomorrow or Wednesday for testing (UA with micro and UCx if abnormal). They agree with plan. No signs of pyelo today.

## 2016-04-07 NOTE — Progress Notes (Signed)
Pre visit review using our clinic review tool, if applicable. No additional management support is needed unless otherwise documented below in the visit note. 

## 2016-04-07 NOTE — Progress Notes (Signed)
BP 110/80   Pulse 77   Temp 98.1 F (36.7 C) (Oral)   Ht 5\' 2"  (1.575 m)   Wt 171 lb 12.8 oz (77.9 kg)   BMI 31.42 kg/m    CC: UTI?, leg pain, back pain Subjective:    Patient ID: Jaclyn [name], female    DOB: 07-27-1935, 81 y.o.   MRN: [id number]  HPI: [name] is a 81 y.o. female presenting on 04/07/2016 for Leg Pain (bilateral leg pain xundetermined time) and Back Pain   Here with son today who waits outside for the majority of the visit.   Patient of Dr Diona Browner presents with 2 wk h/o lower back and leg ache. Endorses L flank pain as well. Denies fevers/chills, nausea, dysuria, urgency, frequency, or hematuria. H/o UTIs that presented similarly. Feels symptoms are slowly improving since she started trimethoprim 1 tab daily ppx dosing she had at home.   Very unsteady gait today, unsure if this is new or chronic. Patient states this is a chronic unsteady gait, son thinks it may have gotten worsen in the past week.  Missed urinal hat when obtaining urine specimen today.   She does remember seeing me last year 03/2015 and I gave her a shot of rocephin for concern for pyelo - she remembers feeling better after this.  States she's been unsteady with imbalance for the past year.  Known TIA, afib on eliquis, uncontrolled DM, fibromyalgia. Has endo appt next month.  Prior on trimethoprim daily by GYN which helped symptoms. She states she just restarted this 3-4 days ago.  Lab Results  Component Value Date   HGBA1C 9.6 (H) 10/31/2015     Relevant past medical, surgical, family and social history reviewed and updated as indicated. Interim medical history since our last visit reviewed. Allergies and medications reviewed and updated.  Outpatient Medications Prior to Visit  Medication Sig Dispense Refill  . carvedilol (COREG) 6.25 MG tablet TAKE ONE TABLET BY MOUTH TWICE DAILY 180 tablet 3  . DULoxetine (CYMBALTA) 30 MG capsule TAKE TWO CAPSULES BY MOUTH ONCE DAILY 180 capsule  1  . ELIQUIS 5 MG TABS tablet TAKE ONE TABLET BY MOUTH TWICE DAILY 180 tablet 1  . ezetimibe (ZETIA) 10 MG tablet Take 1 tablet (10 mg total) by mouth daily. 30 tablet 11  . glipiZIDE (GLUCOTROL) 10 MG tablet TAKE ONE TABLET BY MOUTH ONCE DAILY BEFORE  BREAKFAST 90 tablet 1  . Insulin Glargine (LANTUS SOLOSTAR) 100 UNIT/ML Solostar Pen Inject 85 Units into the skin at bedtime. 15 pen 11  . levothyroxine (SYNTHROID, LEVOTHROID) 112 MCG tablet TAKE 1 TABLET (112 MCG TOTAL) BY MOUTH DAILY. 30 tablet 11  . metFORMIN (GLUCOPHAGE-XR) 500 MG 24 hr tablet Take 3 tablets (1,500 mg total) by mouth daily with breakfast. 90 tablet 11  . methocarbamol (ROBAXIN) 500 MG tablet Take 1 tablet (500 mg total) by mouth every 8 (eight) hours as needed for muscle spasms. 90 tablet 1  . trimethoprim (TRIMPEX) 100 MG tablet Take 1 tablet (100 mg total) by mouth daily. For UTI prophylaxsis 30 tablet 11   No facility-administered medications prior to visit.      Per HPI unless specifically indicated in ROS section below Review of Systems     Objective:    BP 110/80   Pulse 77   Temp 98.1 F (36.7 C) (Oral)   Ht 5\' 2"  (1.575 m)   Wt 171 lb 12.8 oz (77.9 kg)   BMI 31.42 kg/m   Wt  Readings from Last 3 Encounters:  04/07/16 171 lb 12.8 oz (77.9 kg)  11/23/15 167 lb 8 oz (76 kg)  09/14/15 165 lb (74.8 kg)    Physical Exam  Constitutional: She is oriented to person, place, and time. She appears well-developed and well-nourished. No distress.  HENT:  Mouth/Throat: Oropharynx is clear and moist. No oropharyngeal exudate.  HOH  Eyes: Conjunctivae and EOM are normal. Pupils are equal, round, and reactive to light. No scleral icterus.  Neck: Normal range of motion. Neck supple. Carotid bruit is not present.  Cardiovascular: Normal rate, regular rhythm, normal heart sounds and intact distal pulses.   No murmur heard. Pulmonary/Chest: Effort normal and breath sounds normal. No respiratory distress. She has no  wheezes. She has no rales.  Abdominal: Soft. Normal appearance and bowel sounds are normal. She exhibits no distension and no mass. There is no hepatosplenomegaly. There is no tenderness. There is no rigidity, no rebound, no guarding, no CVA tenderness and negative Murphy's sign.  Musculoskeletal: She exhibits no edema.  No pain midline spine No paraspinous mm tenderness Neg SLR bilaterally.  Lymphadenopathy:    She has no cervical adenopathy.  Neurological: She is alert and oriented to person, place, and time. Gait abnormal. Coordination normal.  CN 2-12 intact FTN intact EOMI  No pronator drift Some unsteadiness with romberg Unsteady gait  Skin: Skin is warm and dry. No rash noted.  Psychiatric: Her speech is normal and behavior is normal. Her affect is inappropriate.  Somewhat labile affect.    Lab Results  Component Value Date   CREATININE 0.94 10/31/2015    Lab Results  Component Value Date   TSH 0.725 01/26/2015    Results for orders placed or performed in visit on 11/23/15  HM DIABETES FOOT EXAM  Result Value Ref Range   HM Diabetic Foot Exam done        Assessment & Plan:   Problem List Items Addressed This Visit    Acute cystitis without hematuria - Primary    Difficult historian. Anticipate recurrent UTI in h/o same. Pt endorses noted improvement since starting left over trimethoprim. Unable to provide sample. Will check CBC, BMP today. Start keflex for presumed UTI. I asked pt/son to collect urine sample at home prior to starting keflex then store in [place], and bring in tomorrow or Wednesday for testing (UA with micro and UCx if abnormal). They agree with plan. No signs of pyelo today.       Relevant Orders   Urine culture   Fibromyalgia   History of recurrent UTIs   Hypothyroidism    Overdue for TSH - check today.       Relevant Orders   TSH   Imbalance    Unsure if new or old. As unable to obtain urine specimen today, check labs for other causes (CBC,  BMP, TSH). No focal neurological signs consistent with new stroke today. No increased confusion.       Relevant Orders   CBC with Differential/Platelet   Basic metabolic panel   Poorly controlled diabetes mellitus (HCC)    Chronic, uncontrolled. Update A1c.  Has endo appt next month.       Relevant Orders   Hemoglobin A1c   TIA (transient ischemic attack)       Follow up plan: Return if symptoms worsen or fail to improve.  Ria Bush, MD

## 2016-04-07 NOTE — Patient Instructions (Addendum)
I do think you may have urine infection.  Treat with keflex twice daily for 7 day then may return to trimethoprim daily preventative as previously.  Collect urine sample tonight prior to starting antibiotic, store in refrigerator, then return first thing tomorrow morning for testing. Labs today.  If not improving with treatment, please return to see [name].  If fevers, worsening pain, nausea/vomtiing, please seek urgent care.

## 2016-04-07 NOTE — Assessment & Plan Note (Addendum)
Unsure if new or old. As unable to obtain urine specimen today, check labs for other causes (CBC, BMP, TSH). No focal neurological signs consistent with new stroke today. No increased confusion.

## 2016-04-07 NOTE — Assessment & Plan Note (Signed)
Overdue for TSH - check today.

## 2016-04-08 LAB — CBC WITH DIFFERENTIAL/PLATELET
BASOS PCT: 1 % (ref 0.0–3.0)
Basophils Absolute: 0.1 10*3/uL (ref 0.0–0.1)
EOS PCT: 2.9 % (ref 0.0–5.0)
Eosinophils Absolute: 0.2 10*3/uL (ref 0.0–0.7)
HCT: 38.2 % (ref 36.0–46.0)
Hemoglobin: 13 g/dL (ref 12.0–15.0)
LYMPHS ABS: 2.3 10*3/uL (ref 0.7–4.0)
Lymphocytes Relative: 27.8 % (ref 12.0–46.0)
MCHC: 34 g/dL (ref 30.0–36.0)
MCV: 93.6 fl (ref 78.0–100.0)
MONOS PCT: 8.1 % (ref 3.0–12.0)
Monocytes Absolute: 0.7 10*3/uL (ref 0.1–1.0)
NEUTROS ABS: 5 10*3/uL (ref 1.4–7.7)
NEUTROS PCT: 60.2 % (ref 43.0–77.0)
PLATELETS: 295 10*3/uL (ref 150.0–400.0)
RBC: 4.08 Mil/uL (ref 3.87–5.11)
RDW: 14.8 % (ref 11.5–15.5)
WBC: 8.4 10*3/uL (ref 4.0–10.5)

## 2016-04-08 LAB — BASIC METABOLIC PANEL
BUN: 14 mg/dL (ref 6–23)
CALCIUM: 10.3 mg/dL (ref 8.4–10.5)
CHLORIDE: 98 meq/L (ref 96–112)
CO2: 25 mEq/L (ref 19–32)
CREATININE: 1.05 mg/dL (ref 0.40–1.20)
GFR: 53.78 mL/min — ABNORMAL LOW (ref >60.00)
Glucose, Bld: 216 mg/dL — ABNORMAL HIGH (ref 70–99)
Potassium: 4.5 mEq/L (ref 3.5–5.1)
Sodium: 134 mEq/L — ABNORMAL LOW (ref 135–145)

## 2016-04-08 LAB — HEMOGLOBIN A1C: HEMOGLOBIN A1C: 10.5 % — AB (ref 4.6–6.5)

## 2016-04-08 LAB — TSH: TSH: 68.61 u[IU]/mL — ABNORMAL HIGH (ref 0.35–4.50)

## 2016-04-08 NOTE — Addendum Note (Signed)
Addended by: Ellamae Sia on: 04/08/2016 10:17 AM   Modules accepted: Orders

## 2016-04-09 DIAGNOSIS — N3000 Acute cystitis without hematuria: Secondary | ICD-10-CM | POA: Diagnosis not present

## 2016-04-09 DIAGNOSIS — Z87440 Personal history of urinary (tract) infections: Secondary | ICD-10-CM | POA: Diagnosis not present

## 2016-04-09 NOTE — Addendum Note (Signed)
Addended by: Ellamae Sia on: 04/09/2016 06:21 PM   Modules accepted: Orders

## 2016-04-11 ENCOUNTER — Other Ambulatory Visit: Payer: Self-pay | Admitting: Family Medicine

## 2016-04-11 LAB — URINE CULTURE

## 2016-04-11 MED ORDER — CIPROFLOXACIN HCL 250 MG PO TABS: 250.0000 mg | ORAL_TABLET | Freq: Two times a day (BID) | ORAL | 0 refills | Status: DC

## 2016-04-11 MED ORDER — LEVOTHYROXINE SODIUM 112 MCG PO TABS: ORAL_TABLET | ORAL | 3 refills | Status: DC

## 2016-05-05 ENCOUNTER — Telehealth: Payer: Self-pay | Admitting: Internal Medicine

## 2016-05-05 ENCOUNTER — Ambulatory Visit: Payer: Medicare Other | Admitting: Internal Medicine

## 2016-05-05 NOTE — Telephone Encounter (Signed)
This is an initial. No follow-up necessary.

## 2016-05-05 NOTE — Telephone Encounter (Signed)
Patient no showed today's appt. Please advise on how to follow up. °A. No follow up necessary. °B. Follow up urgent. Contact patient immediately. °C. Follow up necessary. Contact patient and schedule visit in ___ days. °D. Follow up advised. Contact patient and schedule visit in ____weeks. ° °

## 2016-05-08 ENCOUNTER — Ambulatory Visit: Payer: Medicare Other | Admitting: Family Medicine

## 2016-05-09 ENCOUNTER — Ambulatory Visit (INDEPENDENT_AMBULATORY_CARE_PROVIDER_SITE_OTHER): Payer: Medicare Other | Admitting: Family Medicine

## 2016-05-09 ENCOUNTER — Encounter: Payer: Self-pay | Admitting: Family Medicine

## 2016-05-09 ENCOUNTER — Ambulatory Visit: Payer: Medicare Other | Admitting: Internal Medicine

## 2016-05-09 VITALS — BP 144/76 | HR 84 | Temp 97.9°F | Ht 62.0 in | Wt 170.0 lb

## 2016-05-09 DIAGNOSIS — E1165 Type 2 diabetes mellitus with hyperglycemia: Secondary | ICD-10-CM

## 2016-05-09 DIAGNOSIS — R404 Transient alteration of awareness: Secondary | ICD-10-CM | POA: Diagnosis not present

## 2016-05-09 DIAGNOSIS — Z87440 Personal history of urinary (tract) infections: Secondary | ICD-10-CM | POA: Diagnosis not present

## 2016-05-09 DIAGNOSIS — B373 Candidiasis of vulva and vagina: Secondary | ICD-10-CM | POA: Diagnosis not present

## 2016-05-09 DIAGNOSIS — B3731 Acute candidiasis of vulva and vagina: Secondary | ICD-10-CM

## 2016-05-09 DIAGNOSIS — R419 Unspecified symptoms and signs involving cognitive functions and awareness: Secondary | ICD-10-CM

## 2016-05-09 DIAGNOSIS — R300 Dysuria: Secondary | ICD-10-CM

## 2016-05-09 LAB — POC URINALSYSI DIPSTICK (AUTOMATED)
BILIRUBIN UA: NEGATIVE
Blood, UA: NEGATIVE
KETONES UA: NEGATIVE
Leukocytes, UA: NEGATIVE
NITRITE UA: NEGATIVE
Protein, UA: NEGATIVE
Spec Grav, UA: 1.03 (ref 1.030–1.035)
Urobilinogen, UA: 0.2 (ref ?–2.0)
pH, UA: 6 (ref 5.0–8.0)

## 2016-05-09 MED ORDER — METFORMIN HCL ER 500 MG PO TB24: 1000.0000 mg | ORAL_TABLET | Freq: Two times a day (BID) | ORAL | 11 refills | Status: DC

## 2016-05-09 MED ORDER — FLUCONAZOLE 150 MG PO TABS: 150.0000 mg | ORAL_TABLET | Freq: Once | ORAL | 0 refills | Status: AC

## 2016-05-09 NOTE — Progress Notes (Signed)
Subjective:    Patient ID: Jaclyn [name], female    DOB: 08-08-1935, 81 y.o.   MRN: [id number]  HPI  81 year old female with history of frequent UTI, poorly controlled DM, TIA presents with  dysuria and back pain. She is on trimethoprim fro UTI prophylaxsis. She was seen on 2/192018 by Dr. Darnell Level for cystitis.. Treated  With keflex initially.. Given resistant changed to cipro x 5 days. U cx showed enterobacter 10-50,000 CFU   She had felt better until last few weeks, not totally. She is poor historian. She has abd soreness, and low back soreness.I n last 2 days dysuria restarted. No fever. No blood in urine.  She feels like her vision is blurry.  She has been more cloudy thinking lately.     At that point TSH was found to be 68 and A1C high.  She has restarted this. Restarted levothyroxine at that time.  She is taking  Metformin 3 tabs daily. Lantus 85 Units at bedtime. A1c: 10.5. She is not checking blood sugars today. She did not go to ENDO  Initial appt given she felt bad.  Blood pressure (!) 144/76, pulse 84, temperature 97.9 F (36.6 C), temperature source Oral, height 5\' 2"  (1.575 m), weight 170 lb (77.1 kg), SpO2 94 %.  Review of Systems  Constitutional: Negative for fatigue and fever.  HENT: Negative for congestion.   Eyes: Negative for pain.  Respiratory: Negative for cough and shortness of breath.   Cardiovascular: Negative for chest pain, palpitations and leg swelling.  Gastrointestinal: Negative for abdominal pain.  Genitourinary: Positive for dysuria. Negative for vaginal bleeding.  Musculoskeletal: Negative for back pain.  Neurological: Negative for syncope, light-headedness and headaches.  Psychiatric/Behavioral: Negative for dysphoric mood.       Objective:   Physical Exam  Constitutional: Vital signs are normal. She appears well-developed and well-nourished. She is cooperative.  Non-toxic appearance. She does not appear ill. No distress.  HENT:  Head:  Normocephalic.  Right Ear: Hearing, tympanic membrane, external ear and ear canal normal. Tympanic membrane is not erythematous, not retracted and not bulging.  Left Ear: Hearing, tympanic membrane, external ear and ear canal normal. Tympanic membrane is not erythematous, not retracted and not bulging.  Nose: No mucosal edema or rhinorrhea. Right sinus exhibits no maxillary sinus tenderness and no frontal sinus tenderness. Left sinus exhibits no maxillary sinus tenderness and no frontal sinus tenderness.  Mouth/Throat: Uvula is midline, oropharynx is clear and moist and mucous membranes are normal.  Eyes: Conjunctivae, EOM and lids are normal. Pupils are equal, round, and reactive to light. Lids are everted and swept, no foreign bodies found.  Neck: Trachea normal and normal range of motion. Neck supple. Carotid bruit is not present. No thyroid mass and no thyromegaly present.  Cardiovascular: Normal rate, regular rhythm, S1 normal, S2 normal, normal heart sounds, intact distal pulses and normal pulses.  Exam reveals no gallop and no friction rub.   No murmur heard. Pulmonary/Chest: Effort normal and breath sounds normal. No tachypnea. No respiratory distress. She has no decreased breath sounds. She has no wheezes. She has no rhonchi. She has no rales.  Abdominal: Soft. Normal appearance and bowel sounds are normal. There is no tenderness.  Neurological: She is alert.  Skin: Skin is warm, dry and intact. No rash noted.  Psychiatric: Her speech is normal and behavior is normal. Thought content normal. Her mood appears not anxious. Her affect is labile and inappropriate. Cognition and memory are normal.  She expresses impulsivity and inappropriate judgment. She does not exhibit a depressed mood.          Assessment & Plan:

## 2016-05-09 NOTE — Progress Notes (Signed)
Pre visit review using our clinic review tool, if applicable. No additional management support is needed unless otherwise documented below in the visit note. 

## 2016-05-09 NOTE — Progress Notes (Deleted)
Subjective:    Patient ID: Jaclyn [name], female    DOB: 29-Jun-1935, 81 y.o.   MRN: [id number]  HPI  Pt presents to the clinic today to follow up UTI. She was seen 04/09/16 for the same. She had already started taking left over Septra. Dr. Danise Mina stopped the Septra and put her on Keflex. Urine culture grew out Enterobacter, resistant to Keflex. She was prescribed a 5 day course of Cipro. She took all of the medication as prescribed.  Review of Systems      Past Medical History:  Diagnosis Date  . Atrial fibrillation ([place])   . Benign neoplasm of colon   . Carotid artery occlusion    60-79% right ICA stenosis  . Carotid bruit   . Cerebrovascular disease, unspecified   . Difficult intubation 2008   During surgery to remove large polyp  . Diverticulosis of colon (without mention of hemorrhage)   . Dysthymic disorder   . Fibromyalgia   . Headache(784.0)   . Insomnia, unspecified   . Interstitial cystitis   . Myalgia and myositis, unspecified   . Osteoarthrosis, unspecified whether generalized or localized, unspecified site   . Other and unspecified hyperlipidemia   . Other chest pain   . Other specified benign mammary dysplasias   . TIA (transient ischemic attack)   . Type II or unspecified type diabetes mellitus without mention of complication, not stated as uncontrolled   . Unspecified essential hypertension   . Unspecified hypothyroidism   . Unspecified vitamin D deficiency   . Urinary tract infection, site not specified     Current Outpatient Prescriptions  Medication Sig Dispense Refill  . carvedilol (COREG) 6.25 MG tablet TAKE ONE TABLET BY MOUTH TWICE DAILY 180 tablet 3  . ciprofloxacin (CIPRO) 250 MG tablet Take 1 tablet (250 mg total) by mouth 2 (two) times daily. 10 tablet 0  . DULoxetine (CYMBALTA) 30 MG capsule TAKE TWO CAPSULES BY MOUTH ONCE DAILY 180 capsule 1  . ELIQUIS 5 MG TABS tablet TAKE ONE TABLET BY MOUTH TWICE DAILY 180 tablet 1  . ezetimibe  (ZETIA) 10 MG tablet Take 1 tablet (10 mg total) by mouth daily. 30 tablet 11  . glipiZIDE (GLUCOTROL) 10 MG tablet TAKE ONE TABLET BY MOUTH ONCE DAILY BEFORE  BREAKFAST 90 tablet 1  . Insulin Glargine (LANTUS SOLOSTAR) 100 UNIT/ML Solostar Pen Inject 85 Units into the skin at bedtime. 15 pen 11  . levothyroxine (SYNTHROID, LEVOTHROID) 112 MCG tablet TAKE 1 TABLET (112 MCG TOTAL) BY MOUTH DAILY. 30 tablet 3  . metFORMIN (GLUCOPHAGE-XR) 500 MG 24 hr tablet Take 3 tablets (1,500 mg total) by mouth daily with breakfast. 90 tablet 11  . methocarbamol (ROBAXIN) 500 MG tablet Take 1 tablet (500 mg total) by mouth every 8 (eight) hours as needed for muscle spasms. 90 tablet 1  . trimethoprim (TRIMPEX) 100 MG tablet Take 1 tablet (100 mg total) by mouth daily. For UTI prophylaxsis 30 tablet 11   No current facility-administered medications for this visit.     Allergies  Allergen Reactions  . Naproxen Sodium Other (See Comments)    Fever/aches and pains  . Statins     myalgias  . Sulfonamide Derivatives Hives and Itching  . Latex Itching and Rash  . Shellfish Allergy Itching, Swelling and Rash    Seafood, shrimp    Family History  Problem Relation Age of Onset  . Lymphoma Father   . Hypertension Father   . Stroke Father   .  Hyperlipidemia Father   . Hypertension Mother   . Allergies Brother   . Hypertension Sister     3  . Breast cancer Sister   . Fibromyalgia Sister     3  . Hyperlipidemia Sister     3    Social History   Social History  . Marital status: Single    Spouse name: N/A  . Number of children: 3  . Years of education: N/A   Occupational History  . retired Retired   Social History Main Topics  . Smoking status: Never Smoker  . Smokeless tobacco: Never Used  . Alcohol use No  . Drug use: No  . Sexual activity: Not on file   Other Topics Concern  . Not on file   Social History Narrative   1 child died at 73 months old --hx of downs syndrome  Lives with son  in a 2 story home.  Uses the 1st floor.  Has two living children.  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       Constitutional: Denies fever, malaise, fatigue, headache or abrupt weight changes.  HEENT: Denies eye pain, eye redness, ear pain, ringing in the ears, wax buildup, runny nose, nasal congestion, bloody nose, or sore throat. Respiratory: Denies difficulty breathing, shortness of breath, cough or sputum production.   Cardiovascular: Denies chest pain, chest tightness, palpitations or swelling in the hands or feet.  Gastrointestinal: Denies abdominal pain, bloating, constipation, diarrhea or blood in the stool.  GU: Denies urgency, frequency, pain with urination, burning sensation, blood in urine, odor or discharge. Musculoskeletal: Denies decrease in range of motion, difficulty with gait, muscle pain or joint pain and swelling.  Skin: Denies redness, rashes, lesions or ulcercations.  Neurological: Denies dizziness, difficulty with memory, difficulty with speech or problems with balance and coordination.  Psych: Denies anxiety, depression, SI/HI.  No other specific complaints in a complete review of systems (except as listed in HPI above).  Objective:   Physical Exam        Assessment & Plan:

## 2016-05-09 NOTE — Patient Instructions (Addendum)
Take diflucan for yeast infection.  Increase metformin to 2 tabs in AM and 2 tabs in PM. Push fluids. Check blood sugars and schedule follow up in 2 weeks  30 min OV( bring measurements)

## 2016-05-15 ENCOUNTER — Ambulatory Visit: Payer: Medicare Other | Admitting: Family Medicine

## 2016-05-23 ENCOUNTER — Other Ambulatory Visit: Payer: Self-pay | Admitting: Family Medicine

## 2016-05-27 ENCOUNTER — Ambulatory Visit (INDEPENDENT_AMBULATORY_CARE_PROVIDER_SITE_OTHER): Payer: Medicare Other | Admitting: Family Medicine

## 2016-05-27 ENCOUNTER — Encounter: Payer: Self-pay | Admitting: Family Medicine

## 2016-05-27 VITALS — BP 141/76 | HR 97 | Temp 98.3°F | Ht 62.0 in | Wt 168.5 lb

## 2016-05-27 DIAGNOSIS — E1165 Type 2 diabetes mellitus with hyperglycemia: Secondary | ICD-10-CM | POA: Diagnosis not present

## 2016-05-27 DIAGNOSIS — B373 Candidiasis of vulva and vagina: Secondary | ICD-10-CM | POA: Diagnosis not present

## 2016-05-27 DIAGNOSIS — B3731 Acute candidiasis of vulva and vagina: Secondary | ICD-10-CM

## 2016-05-27 MED ORDER — FLUCONAZOLE 150 MG PO TABS: 150.0000 mg | ORAL_TABLET | Freq: Once | ORAL | 1 refills | Status: AC

## 2016-05-27 NOTE — Progress Notes (Signed)
Pre visit review using our clinic review tool, if applicable. No additional management support is needed unless otherwise documented below in the visit note. 

## 2016-05-27 NOTE — Assessment & Plan Note (Signed)
Improved control likely on higher dose of metformin. NO SE. Follow longer and re-eval with FBS next OV.

## 2016-05-27 NOTE — Patient Instructions (Addendum)
Continue metformin at current dose. Check sugars daily and bring to next OV. Repeat diflucan tablet x 1.

## 2016-05-27 NOTE — Progress Notes (Signed)
   Subjective:    Patient ID: Jaclyn [name], female    DOB: 27-May-1935, 81 y.o.   MRN: [id number]  HPI  81 year old female pt returns for 2 week follow up of diabetes. At last OV urinary frequency felt to be du to  Poor control of DM. Recommended increasing metformin to 2 tabs twice daily.  She has not been checking  CBGs regularly.  Yesterday, FBS 156.  No SE on higher dose. No diarrhea, no upset stomach.   Diflucan helped some with vaginal irritation but not all gone.   She missed ENDO appt.   Lab Results  Component Value Date   HGBA1C 10.5 (H) 04/07/2016   Does not have rescheduled.   Review of Systems  Constitutional: Negative for fatigue and fever.  HENT: Negative for ear pain.   Eyes: Negative for pain.  Respiratory: Negative for chest tightness and shortness of breath.   Cardiovascular: Negative for chest pain, palpitations and leg swelling.  Gastrointestinal: Negative for abdominal pain.  Genitourinary: Negative for dysuria.       Objective:   Physical Exam  Constitutional: Vital signs are normal. She appears well-developed and well-nourished. She is cooperative.  Non-toxic appearance. She does not appear ill. No distress.  HENT:  Head: Normocephalic.  Right Ear: Hearing, tympanic membrane, external ear and ear canal normal. Tympanic membrane is not erythematous, not retracted and not bulging.  Left Ear: Hearing, tympanic membrane, external ear and ear canal normal. Tympanic membrane is not erythematous, not retracted and not bulging.  Nose: No mucosal edema or rhinorrhea. Right sinus exhibits no maxillary sinus tenderness and no frontal sinus tenderness. Left sinus exhibits no maxillary sinus tenderness and no frontal sinus tenderness.  Mouth/Throat: Uvula is midline, oropharynx is clear and moist and mucous membranes are normal.  Eyes: Conjunctivae, EOM and lids are normal. Pupils are equal, round, and reactive to light. Lids are everted and swept, no foreign  bodies found.  Neck: Trachea normal and normal range of motion. Neck supple. Carotid bruit is not present. No thyroid mass and no thyromegaly present.  Cardiovascular: Normal rate, regular rhythm, S1 normal, S2 normal, normal heart sounds, intact distal pulses and normal pulses.  Exam reveals no gallop and no friction rub.   No murmur heard. Pulmonary/Chest: Effort normal and breath sounds normal. No tachypnea. No respiratory distress. She has no decreased breath sounds. She has no wheezes. She has no rhonchi. She has no rales.  Abdominal: Soft. Normal appearance and bowel sounds are normal. There is no tenderness.  Neurological: She is alert.  Skin: Skin is warm, dry and intact. No rash noted.  Psychiatric: Her speech is normal and behavior is normal. Judgment and thought content normal. Her mood appears not anxious. Cognition and memory are normal. She does not exhibit a depressed mood.          Assessment & Plan:

## 2016-05-27 NOTE — Assessment & Plan Note (Signed)
Repeat course of diflucan as partially resolved.

## 2016-06-06 ENCOUNTER — Ambulatory Visit: Payer: Medicare Other | Admitting: Family Medicine

## 2016-06-13 ENCOUNTER — Encounter: Payer: Self-pay | Admitting: Family Medicine

## 2016-06-13 ENCOUNTER — Ambulatory Visit (INDEPENDENT_AMBULATORY_CARE_PROVIDER_SITE_OTHER): Payer: Medicare Other | Admitting: Family Medicine

## 2016-06-13 VITALS — BP 146/75 | HR 85 | Temp 98.5°F | Ht 62.0 in | Wt 169.8 lb

## 2016-06-13 DIAGNOSIS — E1165 Type 2 diabetes mellitus with hyperglycemia: Secondary | ICD-10-CM | POA: Diagnosis not present

## 2016-06-13 NOTE — Progress Notes (Signed)
   Subjective:    Patient ID: Jaclyn [name], female    DOB: December 15, 1935, 81 y.o.   MRN: [id number]  HPI   82 year old female presents for follow up poorly controlled DM.  Diabetes: On metformin 2 tabs twice daily.  Max dose caused increased in diarrhea after 1 week. BM with every meal.  FBS was down at 157.  She had to decreased to  2 in morning and 1 at night. Diarrhea resolved  Still taking glipizide max. Using insulin 85 units daily. FBS 143-240  She is waking up in middle of night eating!! She stays up all night.   it is hard to tell what is fasting and not fasting.   Review of Systems  Constitutional: Negative for fatigue and fever.  HENT: Negative for ear pain.   Eyes: Negative for pain.  Respiratory: Negative for chest tightness and shortness of breath.   Cardiovascular: Negative for chest pain, palpitations and leg swelling.  Gastrointestinal: Negative for abdominal pain.  Genitourinary: Negative for dysuria.       Objective:   Physical Exam  Constitutional: Vital signs are normal. She appears well-developed and well-nourished. She is cooperative.  Non-toxic appearance. She does not appear ill. No distress.  HENT:  Head: Normocephalic.  Right Ear: Hearing, tympanic membrane, external ear and ear canal normal. Tympanic membrane is not erythematous, not retracted and not bulging.  Left Ear: Hearing, tympanic membrane, external ear and ear canal normal. Tympanic membrane is not erythematous, not retracted and not bulging.  Nose: No mucosal edema or rhinorrhea. Right sinus exhibits no maxillary sinus tenderness and no frontal sinus tenderness. Left sinus exhibits no maxillary sinus tenderness and no frontal sinus tenderness.  Mouth/Throat: Uvula is midline, oropharynx is clear and moist and mucous membranes are normal.  Eyes: Conjunctivae, EOM and lids are normal. Pupils are equal, round, and reactive to light. Lids are everted and swept, no foreign bodies found.  Neck:  Trachea normal and normal range of motion. Neck supple. Carotid bruit is not present. No thyroid mass and no thyromegaly present.  Cardiovascular: Normal rate, regular rhythm, S1 normal, S2 normal, normal heart sounds, intact distal pulses and normal pulses.  Exam reveals no gallop and no friction rub.   No murmur heard. Pulmonary/Chest: Effort normal and breath sounds normal. No tachypnea. No respiratory distress. She has no decreased breath sounds. She has no wheezes. She has no rhonchi. She has no rales.  Abdominal: Soft. Normal appearance and bowel sounds are normal. There is no tenderness.  Neurological: She is alert.  Skin: Skin is warm, dry and intact. No rash noted.  Psychiatric: Her speech is normal and behavior is normal. Judgment and thought content normal. Her mood appears not anxious. Cognition and memory are normal. She does not exhibit a depressed mood.          Assessment & Plan:

## 2016-06-13 NOTE — Patient Instructions (Addendum)
Stop eating in middle of night. Instead have a pre-bedtime healthy snack. Wake up at reasonable time in the morning. Continue insulin , glipizide and metformin 3 a day. Check daily sugar fasting and 2 hours after a meals. Call with the measurements in 1 week.

## 2016-06-13 NOTE — Progress Notes (Signed)
Pre visit review using our clinic review tool, if applicable. No additional management support is needed unless otherwise documented below in the visit note. 

## 2016-06-18 ENCOUNTER — Other Ambulatory Visit: Payer: Self-pay | Admitting: Family Medicine

## 2016-07-07 DIAGNOSIS — R419 Unspecified symptoms and signs involving cognitive functions and awareness: Secondary | ICD-10-CM | POA: Insufficient documentation

## 2016-07-07 NOTE — Assessment & Plan Note (Signed)
No clear infection. ? Secondary to meds versus poor control of DM.

## 2016-07-07 NOTE — Assessment & Plan Note (Signed)
Poor control. Increase metformin and follow up with CBGs closely. GFR. 60 at last check.

## 2016-07-07 NOTE — Assessment & Plan Note (Signed)
Treat with diflucan.

## 2016-07-07 NOTE — Assessment & Plan Note (Signed)
No current UTI .. symptoms most likely from yeast.

## 2016-07-18 NOTE — Assessment & Plan Note (Signed)
Stop eating in middle of night. Instead have a pre-bedtime healthy snack. Wake up at reasonable time in the morning. Continue insulin , glipizide and metformin 3 a day. Check daily sugar fasting and 2 hours after a meals. Call with the measurements in 1 week.

## 2016-07-29 ENCOUNTER — Ambulatory Visit (INDEPENDENT_AMBULATORY_CARE_PROVIDER_SITE_OTHER): Payer: Medicare Other | Admitting: Family Medicine

## 2016-07-29 ENCOUNTER — Encounter: Payer: Self-pay | Admitting: Family Medicine

## 2016-07-29 VITALS — BP 173/94 | HR 83 | Temp 97.7°F | Ht 62.0 in | Wt 165.8 lb

## 2016-07-29 DIAGNOSIS — E1165 Type 2 diabetes mellitus with hyperglycemia: Secondary | ICD-10-CM

## 2016-07-29 DIAGNOSIS — E785 Hyperlipidemia, unspecified: Secondary | ICD-10-CM | POA: Diagnosis not present

## 2016-07-29 DIAGNOSIS — R609 Edema, unspecified: Secondary | ICD-10-CM

## 2016-07-29 DIAGNOSIS — I10 Essential (primary) hypertension: Secondary | ICD-10-CM | POA: Diagnosis not present

## 2016-07-29 DIAGNOSIS — R7989 Other specified abnormal findings of blood chemistry: Secondary | ICD-10-CM | POA: Diagnosis not present

## 2016-07-29 MED ORDER — TRIMETHOPRIM 100 MG PO TABS: ORAL_TABLET | ORAL | 3 refills | Status: DC

## 2016-07-29 MED ORDER — HYDROCHLOROTHIAZIDE 25 MG PO TABS: 25.0000 mg | ORAL_TABLET | Freq: Every day | ORAL | 3 refills | Status: DC

## 2016-07-29 MED ORDER — APIXABAN 5 MG PO TABS: 5.0000 mg | ORAL_TABLET | Freq: Two times a day (BID) | ORAL | 1 refills | Status: DC

## 2016-07-29 MED ORDER — GLIPIZIDE 10 MG PO TABS: ORAL_TABLET | ORAL | 1 refills | Status: DC

## 2016-07-29 NOTE — Progress Notes (Signed)
   Subjective:    Patient ID: Jaclyn [name], female    DOB: 1935-08-26, 82 y.o.   MRN: [id number]  HPI  81 year old female with DM,  paroxsysmal Afib ( on eliquis),  hypothyroid and hypertension presents with worsened peripheral edema. She has noted in last month. No increase in salt. She is compliant with her BP med.  No cehst pain, no new SOB. She does note one episode of heart skipping a beat 2 weeks ago... Lasted 4-5 days, rarely since.  He BP is also poorly controlled today on  coreg. She has not been following BP at home BP Readings from Last 3 Encounters:  07/29/16 (!) 173/94  06/13/16 (!) 146/75  05/27/16 (!) 141/76    Her Blood sugars are < 200 if she does not eat at night. FBS 158  She was unable ot tolerate the 4 tabs of metformin a day given diarrhea. She is taking 3 tabs daily.  Blood pressure (!) 173/94, pulse 83, temperature 97.7 F (36.5 C), temperature source Oral, height 5\' 2"  (1.575 m), weight 165 lb 12 oz (75.2 kg).    Review of Systems     Objective:   Physical Exam  Constitutional: Vital signs are normal. She appears well-developed and well-nourished. She is cooperative.  Non-toxic appearance. She does not appear ill. No distress.  HENT:  Head: Normocephalic.  Right Ear: Hearing, tympanic membrane, external ear and ear canal normal. Tympanic membrane is not erythematous, not retracted and not bulging.  Left Ear: Hearing, tympanic membrane, external ear and ear canal normal. Tympanic membrane is not erythematous, not retracted and not bulging.  Nose: No mucosal edema or rhinorrhea. Right sinus exhibits no maxillary sinus tenderness and no frontal sinus tenderness. Left sinus exhibits no maxillary sinus tenderness and no frontal sinus tenderness.  Mouth/Throat: Uvula is midline, oropharynx is clear and moist and mucous membranes are normal.  Eyes: Conjunctivae, EOM and lids are normal. Pupils are equal, round, and reactive to light. Lids are everted and  swept, no foreign bodies found.  Neck: Trachea normal and normal range of motion. Neck supple. Carotid bruit is not present. No thyroid mass and no thyromegaly present.  Cardiovascular: Normal rate, regular rhythm, S1 normal, S2 normal, normal heart sounds, intact distal pulses and normal pulses.  Exam reveals no gallop and no friction rub.   No murmur heard.  2 plus pitting edema bilaterally   bilateral varicose veins on lower legs  Pulmonary/Chest: Effort normal and breath sounds normal. No tachypnea. No respiratory distress. She has no decreased breath sounds. She has no wheezes. She has no rhonchi. She has no rales.  Abdominal: Soft. Normal appearance and bowel sounds are normal. There is no tenderness.  Neurological: She is alert.  Skin: Skin is warm, dry and intact. No rash noted.  Psychiatric: Her speech is normal and behavior is normal. Judgment and thought content normal. Her mood appears not anxious. Cognition and memory are normal. She does not exhibit a depressed mood.          Assessment & Plan:

## 2016-07-29 NOTE — Assessment & Plan Note (Signed)
Likely multifactorial.  Poor HTN control, poor diet, varicose veins.  Will eval with labs to rule out other secondary issues.

## 2016-07-29 NOTE — Addendum Note (Signed)
Addended by: Pilar Grammes on: 07/29/2016 04:55 PM   Modules accepted: Orders

## 2016-07-29 NOTE — Assessment & Plan Note (Signed)
COnotnue coreg. Add HCTZ. May also need increased dose of coreg.

## 2016-07-29 NOTE — Assessment & Plan Note (Signed)
Trial of 3 tabs metformin daily. 4 caused diarrhea.   Continue dietary changes.

## 2016-07-29 NOTE — Assessment & Plan Note (Signed)
Due for re-eval. 

## 2016-07-29 NOTE — Patient Instructions (Addendum)
Continue not eating at night, stop desserts.  Try to see if you can take 3 tabs daily of metformin.  Goal fasting blood sugar < 120.  Start HCTZ daily in morning.  Follow Blood pressure  Daily.. Goal < 140/90.  Please stop at the lab to set up to have labs drawn.

## 2016-07-30 LAB — COMPREHENSIVE METABOLIC PANEL
ALBUMIN: 4.3 g/dL (ref 3.5–5.2)
ALT: 40 U/L — AB (ref 0–35)
AST: 53 U/L — AB (ref 0–37)
Alkaline Phosphatase: 79 U/L (ref 39–117)
BILIRUBIN TOTAL: 0.7 mg/dL (ref 0.2–1.2)
BUN: 8 mg/dL (ref 6–23)
CALCIUM: 9.9 mg/dL (ref 8.4–10.5)
CO2: 26 meq/L (ref 19–32)
CREATININE: 0.72 mg/dL (ref 0.40–1.20)
Chloride: 102 mEq/L (ref 96–112)
GFR: 83.06 mL/min (ref >60.00)
Glucose, Bld: 186 mg/dL — ABNORMAL HIGH (ref 70–99)
Potassium: 3.9 mEq/L (ref 3.5–5.1)
Sodium: 138 mEq/L (ref 135–145)
TOTAL PROTEIN: 7.5 g/dL (ref 6.0–8.3)

## 2016-07-30 LAB — LIPID PANEL
CHOLESTEROL: 212 mg/dL — AB (ref 0–200)
HDL: 36.7 mg/dL — ABNORMAL LOW (ref >39.00)
NonHDL: 175.77
Total CHOL/HDL Ratio: 6
Triglycerides: 228 mg/dL — ABNORMAL HIGH (ref 0.0–149.0)
VLDL: 45.6 mg/dL — ABNORMAL HIGH (ref 0.0–40.0)

## 2016-07-30 LAB — BRAIN NATRIURETIC PEPTIDE: Brain Natriuretic Peptide: 21.9 pg/mL (ref <100)

## 2016-07-30 LAB — LDL CHOLESTEROL, DIRECT: LDL DIRECT: 148 mg/dL

## 2016-07-30 LAB — HEMOGLOBIN A1C: HEMOGLOBIN A1C: 10.6 % — AB (ref 4.6–6.5)

## 2016-08-01 MED ORDER — SITAGLIPTIN PHOSPHATE 100 MG PO TABS: 100.0000 mg | ORAL_TABLET | Freq: Every day | ORAL | 5 refills | Status: DC

## 2016-08-01 NOTE — Addendum Note (Signed)
Addended by: Carter Kitten on: 08/01/2016 10:51 AM   Modules accepted: Orders

## 2016-08-15 ENCOUNTER — Ambulatory Visit: Payer: Medicare Other | Admitting: Family Medicine

## 2016-08-15 ENCOUNTER — Other Ambulatory Visit: Payer: Self-pay | Admitting: Cardiovascular Disease

## 2016-09-25 ENCOUNTER — Telehealth: Payer: Self-pay | Admitting: Cardiovascular Disease

## 2016-09-25 MED ORDER — CARVEDILOL 6.25 MG PO TABS: 6.2500 mg | ORAL_TABLET | Freq: Two times a day (BID) | ORAL | 2 refills | Status: DC

## 2016-09-25 NOTE — Telephone Encounter (Signed)
New message    *STAT* If patient is at the pharmacy, call can be transferred to refill team.   1. Which medications need to be refilled? (please list name of each medication and dose if known) carvedilol (COREG) 6.25 MG tablet  2. Which pharmacy/location (including street and city if local pharmacy) is medication to be sent to? [facility] in [place]  3. Do they need a 30 day or 90 day supply? Appt 12/17/16 @ 2PM with [name]

## 2016-10-01 ENCOUNTER — Other Ambulatory Visit: Payer: Self-pay | Admitting: Family Medicine

## 2016-12-05 ENCOUNTER — Other Ambulatory Visit: Payer: Self-pay | Admitting: Cardiovascular Disease

## 2016-12-05 ENCOUNTER — Other Ambulatory Visit: Payer: Self-pay | Admitting: Family Medicine

## 2016-12-05 NOTE — Telephone Encounter (Signed)
Medication Detail    Disp Refills Start End   carvedilol (COREG) 6.25 MG tablet 60 tablet 2 09/25/2016    Sig - Route: Take 1 tablet (6.25 mg total) by mouth 2 (two) times daily. Please keep 12/17/16 appt for further refills - Oral   Sent to pharmacy as: carvedilol (COREG) 6.25 MG tablet   E-Prescribing Status: Receipt confirmed by pharmacy (09/25/2016 9:48 AM EDT)   Pharmacy   [id number] - [place], [place]

## 2016-12-16 NOTE — Progress Notes (Signed)
Patient ID: Jaclyn [name], female   DOB: [birth year], 81 y.o.   MRN: [id number]   Jaclyn [name] is seen today fo rf/U of HTN, elevated lipids previous atypical SSCP and right carotid bruit. She's had a normal cath in 2008. She has known RICA dx. I reviewed her duplex from 10/2014   and she has stable 60-79% RICA stenosis (384/66), and 5-99% LICA stenosis. She has had no recurrent SSCP, no TIA like symptoms.  She is intolerant to statins.    Still with somewhat poorly controlled DM  A1c in 8 range  Poor diet  Lantus recently increased by Dr [name]   Duplex 07/12/15: bilateral 40-59% ICA stenosis   Hospitalized in [place] September 2016 ? TIA;s Do not have records. She was having intermittent aphasia.  Not clear if any PAF documented But she was placed on eliquis  She has never had PAF with [name].  She denies any palpitations   I sent her to EP Dr Lovena Le for possible ILR and he did not think she needed it and would not stop Her anticoagulation even if ILR showed no PAF.   She has psychiatric issues with mood swings and manic tendencies. Progressive gait disorder should Have further neuro evaluation   ROS: Denies fever, malais, weight loss, blurry vision, decreased visual acuity, cough, sputum, SOB, hemoptysis, pleuritic pain, palpitaitons, heartburn, abdominal pain, melena, lower extremity edema, claudication, or rash.  All other systems reviewed and negative  General: Affect appropriate Healthy:  appears stated age 81: normal Neck supple with no adenopathy JVP normal right bruits no thyromegaly Lungs clear with no wheezing and good diaphragmatic motion Heart:  S1/S2 no murmur, no rub, gallop or click PMI normal Abdomen: benighn, BS positve, no tenderness, no AAA no bruit.  No HSM or HJR Distal pulses intact with no bruits No edema Neuro non-focal Skin warm and dry No muscular weakness   Current Outpatient Prescriptions  Medication Sig Dispense Refill  . apixaban (ELIQUIS) 5 MG TABS tablet  Take 1 tablet (5 mg total) by mouth 2 (two) times daily. 180 tablet 1  . carvedilol (COREG) 6.25 MG tablet Take 1 tablet (6.25 mg total) by mouth 2 (two) times daily. Please keep 12/17/16 appt for further refills 60 tablet 2  . DULoxetine (CYMBALTA) 30 MG capsule TAKE 2 CAPSULES BY MOUTH ONCE DAILY 180 capsule 1  . ezetimibe (ZETIA) 10 MG tablet TAKE ONE TABLET BY MOUTH ONCE DAILY 90 tablet 1  . glipiZIDE (GLUCOTROL) 10 MG tablet TAKE ONE TABLET BY MOUTH ONCE DAILY BEFORE  BREAKFAST 90 tablet 1  . Insulin Glargine (LANTUS SOLOSTAR) 100 UNIT/ML Solostar Pen Inject 85 Units into the skin at bedtime. 15 pen 11  . levothyroxine (SYNTHROID, LEVOTHROID) 112 MCG tablet TAKE 1 TABLET BY MOUTH ONCE DAILY 90 tablet 1  . metFORMIN (GLUCOPHAGE-XR) 500 MG 24 hr tablet TAKE 3 TABLETS BY MOUTH ONCE DAILY WITH  BREAKFAST 90 tablet 1  . trimethoprim (TRIMPEX) 100 MG tablet TAKE ONE TABLET BY MOUTH ONCE DAILY FOR UTI PROPHYLAXSIS 90 tablet 3   No current facility-administered medications for this visit.     Allergies  Naproxen sodium; Statins; Sulfonamide derivatives; Latex; and Shellfish allergy  Electrocardiogram:   05/27/13  SR LAFB  Poor R wave progression  06/30/14  ST rate 109  Poor R wave progression increased HR since previous 12/17/16 SR rate 89 low voltage   Assessment and Plan  HTN:  Well controlled.  Continue current medications and low sodium Dash type diet.  Carotid: bilateral moderate disease f/u duplex  Ordered   DM:  Discussed low carb diet.  Target hemoglobin A1c is 6.5 or less.  Continue current medications. MS:  Seems to have some manic tendencyies with depression f/u with primary Currently on cymbalta MRI 07/12/15 poor quality no acute findings but progressive gait disorder f/u primary and consider re evaluation By neuro  TIA:  Presumed from PAF Seen by EP and they would not place ILR For now as she has not had any episodes of speech difficulty since hospital d/c UTI:  F/u primary s/p  multiple Rx antibiotics on Trimpex suppression   Jenkins Rouge

## 2016-12-17 ENCOUNTER — Ambulatory Visit (INDEPENDENT_AMBULATORY_CARE_PROVIDER_SITE_OTHER): Payer: Medicare Other | Admitting: Cardiovascular Disease

## 2016-12-17 ENCOUNTER — Encounter: Payer: Self-pay | Admitting: Cardiovascular Disease

## 2016-12-17 VITALS — BP 148/60 | HR 89 | Ht 62.0 in | Wt 159.0 lb

## 2016-12-17 DIAGNOSIS — I10 Essential (primary) hypertension: Secondary | ICD-10-CM | POA: Diagnosis not present

## 2016-12-17 DIAGNOSIS — I6523 Occlusion and stenosis of bilateral carotid arteries: Secondary | ICD-10-CM

## 2016-12-17 NOTE — Patient Instructions (Addendum)
Medication Instructions:  Your physician recommends that you continue on your current medications as directed. Please refer to the Current Medication list given to you today.  Labwork: NONE  Testing/Procedures: Your physician has requested that you have a carotid duplex. This test is an ultrasound of the carotid arteries in your neck. It looks at blood flow through these arteries that supply the brain with blood. Allow one hour for this exam. There are no restrictions or special instructions.  Follow-Up: Your physician wants you to follow-up in: 12 months with Dr. Nishan. You will receive a reminder letter in the mail two months in advance. If you don't receive a letter, please call our office to schedule the follow-up appointment.   If you need a refill on your cardiac medications before your next appointment, please call your pharmacy.    

## 2016-12-26 ENCOUNTER — Other Ambulatory Visit: Payer: Self-pay

## 2016-12-26 ENCOUNTER — Telehealth: Payer: Self-pay | Admitting: Family Medicine

## 2016-12-26 ENCOUNTER — Other Ambulatory Visit: Payer: Self-pay | Admitting: Family Medicine

## 2016-12-26 MED ORDER — INSULIN GLARGINE 100 UNIT/ML SOLOSTAR PEN: 85.0000 [IU] | PEN_INJECTOR | Freq: Every day | SUBCUTANEOUS | 11 refills | Status: DC

## 2016-12-26 NOTE — Telephone Encounter (Signed)
Copied from [facility] [id number]. Topic: Quick Communication - See Telephone Encounter >> Dec 26, 2016  2:34 PM Arletha Grippe wrote: CRM for notification. See Telephone encounter for:   12/26/16. Pt needs refill on Insulin Glargine (LANTUS SOLOSTAR) 100 UNIT/ML Solostar Pen. She needs it today and uses [facility] [address] number for pt.

## 2016-12-26 NOTE — Telephone Encounter (Signed)
OV 07/29/16

## 2016-12-26 NOTE — Telephone Encounter (Signed)
Call pt.. She needs to make appt for DM 30 min with labs prior

## 2016-12-26 NOTE — Telephone Encounter (Signed)
Last office visit 07/29/2016.  AVS states to follow up in 2 weeks. No future appointments.  Refill?

## 2016-12-26 NOTE — Telephone Encounter (Signed)
Left message for Jaclyn [name] to please call the office on Monday to schedule a lab appointment and a 30 minute office visit with Dr. Diona Browner to follow up on her diabetes.

## 2016-12-30 ENCOUNTER — Telehealth: Payer: Self-pay | Admitting: Family Medicine

## 2016-12-30 NOTE — Telephone Encounter (Signed)
Attempted to contact pt; vm full and unable to leave messages

## 2016-12-30 NOTE — Telephone Encounter (Signed)
Spoke with Jaclyn [name].  She has transportation issues because she can't drive and has to depend on someone to bring her.  She has a ride next week but Dr. Diona Browner is on Vacation.  She will check with her son and friend to see when they can bring her and call back to schedule office visit with Dr. Diona Browner to follow up on her diabetes.

## 2016-12-30 NOTE — Telephone Encounter (Signed)
Copied from [place] [id number]. Topic: Quick Communication - See Telephone Encounter >> Dec 30, 2016  3:50 PM Vernona Rieger wrote: CRM for notification. See Telephone encounter for:  Patient has called and said Butch Penny called her and told her to call and schedule labs for her A1C1 to be checked & then she says she dont know really what Butch Penny means. So I did not schedule this, I need someone to please call the patient to figure out what she needs scheduled for. She says she doesn't drive & needs to know asap so she can find a ride. Says she could do next Tuesday or Wednesday.  12/30/16.

## 2016-12-31 ENCOUNTER — Encounter (HOSPITAL_COMMUNITY): Payer: Medicare Other

## 2017-01-13 ENCOUNTER — Other Ambulatory Visit: Payer: Self-pay | Admitting: Cardiovascular Disease

## 2017-01-13 ENCOUNTER — Other Ambulatory Visit: Payer: Self-pay | Admitting: Family Medicine

## 2017-01-13 NOTE — Telephone Encounter (Signed)
Last office visit 07/29/2016.  See phone note from 12/30/2016.  Refill?

## 2017-01-13 NOTE — Telephone Encounter (Signed)
Reviewed note from 11/13..  Pt still needs to find away to get to an appt. Please request again. Will refill #[id number] [id number].

## 2017-02-06 ENCOUNTER — Ambulatory Visit: Payer: Medicare Other | Admitting: Family Medicine

## 2017-02-20 ENCOUNTER — Ambulatory Visit (HOSPITAL_COMMUNITY): Payer: Medicare Other

## 2017-04-28 ENCOUNTER — Other Ambulatory Visit: Payer: Self-pay | Admitting: Family Medicine

## 2017-04-28 NOTE — Telephone Encounter (Signed)
Last office visit 07/29/2016.  AVS states to follow up in 2 weeks.  No future appointments.  Transportation issues.  Refill?

## 2017-04-29 ENCOUNTER — Telehealth: Payer: Self-pay | Admitting: *Deleted

## 2017-04-29 NOTE — Telephone Encounter (Signed)
Received fax from [facility] stating brand of levothyroxine patient has been on is not available.  Asking if they can switch brands.  Notified via fax that it is okay to change brands but patient will need an appointment in 4 weeks to check her TSH level.

## 2017-05-08 ENCOUNTER — Encounter: Payer: Self-pay | Admitting: Family Medicine

## 2017-05-08 ENCOUNTER — Ambulatory Visit: Payer: Medicare Other | Admitting: Family Medicine

## 2017-05-08 VITALS — BP 130/66 | HR 77 | Temp 98.3°F | Ht 62.0 in | Wt 160.5 lb

## 2017-05-08 DIAGNOSIS — I10 Essential (primary) hypertension: Secondary | ICD-10-CM

## 2017-05-08 DIAGNOSIS — E1165 Type 2 diabetes mellitus with hyperglycemia: Secondary | ICD-10-CM | POA: Diagnosis not present

## 2017-05-08 LAB — LDL CHOLESTEROL, DIRECT: Direct LDL: 132 mg/dL

## 2017-05-08 LAB — COMPREHENSIVE METABOLIC PANEL
ALBUMIN: 4.2 g/dL (ref 3.5–5.2)
ALT: 25 U/L (ref 0–35)
AST: 25 U/L (ref 0–37)
Alkaline Phosphatase: 74 U/L (ref 39–117)
BILIRUBIN TOTAL: 0.6 mg/dL (ref 0.2–1.2)
BUN: 13 mg/dL (ref 6–23)
CO2: 28 mEq/L (ref 19–32)
CREATININE: 0.83 mg/dL (ref 0.40–1.20)
Calcium: 10 mg/dL (ref 8.4–10.5)
Chloride: 103 mEq/L (ref 96–112)
GFR: 70.35 mL/min (ref >60.00)
GLUCOSE: 207 mg/dL — AB (ref 70–99)
Potassium: 4.9 mEq/L (ref 3.5–5.1)
SODIUM: 141 meq/L (ref 135–145)
TOTAL PROTEIN: 7.5 g/dL (ref 6.0–8.3)

## 2017-05-08 LAB — MICROALBUMIN / CREATININE URINE RATIO
Creatinine,U: 108.9 mg/dL
Microalb Creat Ratio: 2.9 mg/g (ref 0.0–30.0)
Microalb, Ur: 3.2 mg/dL — ABNORMAL HIGH (ref 0.0–1.9)

## 2017-05-08 LAB — LIPID PANEL
CHOL/HDL RATIO: 5
CHOLESTEROL: 202 mg/dL — AB (ref 0–200)
HDL: 41.7 mg/dL (ref >39.00)
NONHDL: 160.22
TRIGLYCERIDES: 255 mg/dL — AB (ref 0.0–149.0)
VLDL: 51 mg/dL — AB (ref 0.0–40.0)

## 2017-05-08 LAB — HM DIABETES FOOT EXAM

## 2017-05-08 LAB — HEMOGLOBIN A1C: Hgb A1c MFr Bld: 11.7 % — ABNORMAL HIGH (ref 4.6–6.5)

## 2017-05-08 NOTE — Progress Notes (Signed)
Subjective:    Patient ID: Jaclyn [name], female    DOB: 17-Feb-1936, 82 y.o.   MRN: [id number]  HPI    82 year old female presents for follow up DM and HTN   She is having less pain.. Able to walk a whole lot more. She reports she feels more like herself.   Wt Readings from Last 3 Encounters:  05/08/17 160 lb 8 oz (72.8 kg)  12/17/16 159 lb (72.1 kg)  07/29/16 165 lb 12 oz (75.2 kg)     Diabetes:   Due for re-eval. On glipizide, lantus 85 units ( she is only taking 75 units), metformin 1500 mg ER daily. ( 2000 mg daily caused diarrhea) Lab Results  Component Value Date   HGBA1C 10.6 (H) 07/29/2016  Using medications without difficulties: Hypoglycemic episodes: Hyperglycemic episodes: Feet problems: Blood Sugars averaging:  FBS 200 eye exam within last year: overdue  Hypertension:    Good control today on current regimen. Coreg,  BP Readings from Last 3 Encounters:  05/08/17 130/66  12/17/16 (!) 148/60  07/29/16 (!) 173/94  Using medication without problems or lightheadedness:  Chest pain with exertion: Edema: Short of breath: Average home BPs: Other issues:    Review of Systems  Constitutional: Negative for fatigue and fever.  HENT: Negative for congestion.   Eyes: Negative for pain.  Respiratory: Negative for cough and shortness of breath.   Cardiovascular: Negative for chest pain, palpitations and leg swelling.  Gastrointestinal: Negative for abdominal pain.  Genitourinary: Negative for dysuria and vaginal bleeding.  Musculoskeletal: Negative for back pain.  Neurological: Negative for syncope, light-headedness and headaches.  Psychiatric/Behavioral: Negative for dysphoric mood.       Objective:   Physical Exam  Constitutional: Vital signs are normal. She appears well-developed and well-nourished. She is cooperative.  Non-toxic appearance. She does not appear ill. No distress.  HENT:  Head: Normocephalic.  Right Ear: Hearing, tympanic membrane,  external ear and ear canal normal. Tympanic membrane is not erythematous, not retracted and not bulging.  Left Ear: Hearing, tympanic membrane, external ear and ear canal normal. Tympanic membrane is not erythematous, not retracted and not bulging.  Nose: No mucosal edema or rhinorrhea. Right sinus exhibits no maxillary sinus tenderness and no frontal sinus tenderness. Left sinus exhibits no maxillary sinus tenderness and no frontal sinus tenderness.  Mouth/Throat: Uvula is midline, oropharynx is clear and moist and mucous membranes are normal.  Eyes: Pupils are equal, round, and reactive to light. Conjunctivae, EOM and lids are normal. Lids are everted and swept, no foreign bodies found.  Neck: Trachea normal and normal range of motion. Neck supple. Carotid bruit is not present. No thyroid mass and no thyromegaly present.  Cardiovascular: Normal rate, regular rhythm, S1 normal, S2 normal, normal heart sounds, intact distal pulses and normal pulses. Exam reveals no gallop and no friction rub.  No murmur heard.  2 plus pitting edema bilaterally   bilateral varicose veins on lower legs  Pulmonary/Chest: Effort normal and breath sounds normal. No tachypnea. No respiratory distress. She has no decreased breath sounds. She has no wheezes. She has no rhonchi. She has no rales.  Abdominal: Soft. Normal appearance and bowel sounds are normal. There is no tenderness.  Neurological: She is alert.  Skin: Skin is warm, dry and intact. No rash noted.  Psychiatric: Her speech is normal and behavior is normal. Judgment and thought content normal. Her mood appears not anxious. Cognition and memory are normal. She does not exhibit a  depressed mood.     Diabetic foot exam: Normal inspection No skin breakdown No calluses  Normal DP pulses Normal sensation to light touch and monofilament Nails normal      Assessment & Plan:

## 2017-05-08 NOTE — Assessment & Plan Note (Signed)
Very poor control. Increase insulin and titrate up.  May need additional meal time insulin. SE to metformin at higher dose.  Unclear if sulfonylurea helping.. May D?C if adding additional med.  Close follow up with [facility] over phone.

## 2017-05-08 NOTE — Assessment & Plan Note (Signed)
Much improved control on current med.

## 2017-05-08 NOTE — Patient Instructions (Addendum)
Please stop at the lab to have labs drawn. Set up yearly eye exam.  Increase lantus back up to 85 units.  Then you can increase 2 units every 2-3 days until fasting blood sugar is < 120.   Call in next  2 week with blood sugar measurements and dose.

## 2017-05-14 ENCOUNTER — Telehealth: Payer: Self-pay | Admitting: Family Medicine

## 2017-05-14 NOTE — Telephone Encounter (Signed)
Copied from [place] [id number]. Topic: Quick Communication - See Telephone Encounter >> May 14, 2017  3:36 PM Vernona Rieger wrote: CRM for notification. See Telephone encounter for: 05/14/17.  Pt is requesting her labs. Please call her [phone], she said she is hard of hearing. She may not hear it ring.

## 2017-05-14 NOTE — Telephone Encounter (Signed)
Charted in result notes. 

## 2017-05-17 ENCOUNTER — Other Ambulatory Visit: Payer: Self-pay | Admitting: Family Medicine

## 2017-06-19 ENCOUNTER — Emergency Department (HOSPITAL_COMMUNITY): Payer: Medicare Other

## 2017-06-19 ENCOUNTER — Encounter (HOSPITAL_COMMUNITY): Payer: Self-pay | Admitting: Emergency Medicine

## 2017-06-19 ENCOUNTER — Other Ambulatory Visit: Payer: Self-pay | Admitting: *Deleted

## 2017-06-19 ENCOUNTER — Observation Stay (HOSPITAL_COMMUNITY)
Admission: EM | Admit: 2017-06-19 | Discharge: 2017-06-21 | Disposition: A | Discharge status: Home / Self Care | Payer: Medicare Other | Attending: Internal Medicine | Admitting: Internal Medicine

## 2017-06-19 ENCOUNTER — Other Ambulatory Visit: Payer: Self-pay

## 2017-06-19 DIAGNOSIS — R4182 Altered mental status, unspecified: Secondary | ICD-10-CM

## 2017-06-19 DIAGNOSIS — I679 Cerebrovascular disease, unspecified: Secondary | ICD-10-CM

## 2017-06-19 DIAGNOSIS — Z79899 Other long term (current) drug therapy: Secondary | ICD-10-CM | POA: Diagnosis not present

## 2017-06-19 DIAGNOSIS — E039 Hypothyroidism, unspecified: Secondary | ICD-10-CM | POA: Diagnosis not present

## 2017-06-19 DIAGNOSIS — I6523 Occlusion and stenosis of bilateral carotid arteries: Secondary | ICD-10-CM | POA: Diagnosis not present

## 2017-06-19 DIAGNOSIS — I10 Essential (primary) hypertension: Secondary | ICD-10-CM | POA: Diagnosis present

## 2017-06-19 DIAGNOSIS — I482 Chronic atrial fibrillation, unspecified: Secondary | ICD-10-CM

## 2017-06-19 DIAGNOSIS — E1159 Type 2 diabetes mellitus with other circulatory complications: Secondary | ICD-10-CM | POA: Diagnosis present

## 2017-06-19 DIAGNOSIS — I63231 Cerebral infarction due to unspecified occlusion or stenosis of right carotid arteries: Secondary | ICD-10-CM | POA: Diagnosis not present

## 2017-06-19 DIAGNOSIS — I480 Paroxysmal atrial fibrillation: Secondary | ICD-10-CM | POA: Diagnosis present

## 2017-06-19 DIAGNOSIS — R2681 Unsteadiness on feet: Secondary | ICD-10-CM | POA: Insufficient documentation

## 2017-06-19 DIAGNOSIS — M6281 Muscle weakness (generalized): Secondary | ICD-10-CM | POA: Insufficient documentation

## 2017-06-19 DIAGNOSIS — Z91040 Latex allergy status: Secondary | ICD-10-CM | POA: Insufficient documentation

## 2017-06-19 DIAGNOSIS — G459 Transient cerebral ischemic attack, unspecified: Secondary | ICD-10-CM

## 2017-06-19 DIAGNOSIS — Z794 Long term (current) use of insulin: Secondary | ICD-10-CM | POA: Insufficient documentation

## 2017-06-19 DIAGNOSIS — I4891 Unspecified atrial fibrillation: Secondary | ICD-10-CM | POA: Diagnosis present

## 2017-06-19 DIAGNOSIS — I639 Cerebral infarction, unspecified: Secondary | ICD-10-CM | POA: Diagnosis not present

## 2017-06-19 DIAGNOSIS — E119 Type 2 diabetes mellitus without complications: Secondary | ICD-10-CM | POA: Insufficient documentation

## 2017-06-19 DIAGNOSIS — Z8673 Personal history of transient ischemic attack (TIA), and cerebral infarction without residual deficits: Secondary | ICD-10-CM | POA: Diagnosis not present

## 2017-06-19 DIAGNOSIS — R4781 Slurred speech: Secondary | ICD-10-CM | POA: Diagnosis not present

## 2017-06-19 DIAGNOSIS — Z7901 Long term (current) use of anticoagulants: Secondary | ICD-10-CM | POA: Diagnosis not present

## 2017-06-19 DIAGNOSIS — Z87440 Personal history of urinary (tract) infections: Secondary | ICD-10-CM | POA: Insufficient documentation

## 2017-06-19 DIAGNOSIS — I6521 Occlusion and stenosis of right carotid artery: Secondary | ICD-10-CM

## 2017-06-19 LAB — DIFFERENTIAL
BASOS ABS: 0 10*3/uL (ref 0.0–0.1)
Basophils Relative: 0 %
Eosinophils Absolute: 0.2 10*3/uL (ref 0.0–0.7)
Eosinophils Relative: 2 %
LYMPHS ABS: 2.3 10*3/uL (ref 0.7–4.0)
LYMPHS PCT: 29 %
Monocytes Absolute: 0.6 10*3/uL (ref 0.1–1.0)
Monocytes Relative: 7 %
Neutro Abs: 5 10*3/uL (ref 1.7–7.7)
Neutrophils Relative %: 62 %

## 2017-06-19 LAB — COMPREHENSIVE METABOLIC PANEL
ALBUMIN: 3.7 g/dL (ref 3.5–5.0)
ALK PHOS: 75 U/L (ref 38–126)
ALT: 23 U/L (ref 14–54)
AST: 28 U/L (ref 15–41)
Anion gap: 12 (ref 5–15)
BILIRUBIN TOTAL: 0.7 mg/dL (ref 0.3–1.2)
BUN: 9 mg/dL (ref 6–20)
CALCIUM: 9 mg/dL (ref 8.9–10.3)
CO2: 24 mmol/L (ref 22–32)
Chloride: 102 mmol/L (ref 101–111)
Creatinine, Ser: 0.86 mg/dL (ref 0.44–1.00)
GFR calc Af Amer: >60 mL/min (ref >60)
GFR calc non Af Amer: >60 mL/min (ref >60)
GLUCOSE: 271 mg/dL — AB (ref 65–99)
POTASSIUM: 3.9 mmol/L (ref 3.5–5.1)
Sodium: 138 mmol/L (ref 135–145)
Total Protein: 7 g/dL (ref 6.5–8.1)

## 2017-06-19 LAB — CBC
HCT: 37.9 % (ref 36.0–46.0)
HEMOGLOBIN: 12.7 g/dL (ref 12.0–15.0)
MCH: 30.5 pg (ref 26.0–34.0)
MCHC: 33.5 g/dL (ref 30.0–36.0)
MCV: 90.9 fL (ref 78.0–100.0)
Platelets: 261 10*3/uL (ref 150–400)
RBC: 4.17 MIL/uL (ref 3.87–5.11)
RDW: 13.9 % (ref 11.5–15.5)
WBC: 8.1 10*3/uL (ref 4.0–10.5)

## 2017-06-19 LAB — I-STAT CHEM 8, ED
BUN: 8 mg/dL (ref 6–20)
CREATININE: 0.7 mg/dL (ref 0.44–1.00)
Calcium, Ion: 1.1 mmol/L — ABNORMAL LOW (ref 1.15–1.40)
Chloride: 103 mmol/L (ref 101–111)
Glucose, Bld: 263 mg/dL — ABNORMAL HIGH (ref 65–99)
HEMATOCRIT: 38 % (ref 36.0–46.0)
Hemoglobin: 12.9 g/dL (ref 12.0–15.0)
POTASSIUM: 3.9 mmol/L (ref 3.5–5.1)
Sodium: 141 mmol/L (ref 135–145)
TCO2: 26 mmol/L (ref 22–32)

## 2017-06-19 LAB — ETHANOL: Alcohol, Ethyl (B): <10 mg/dL (ref <10)

## 2017-06-19 LAB — APTT: APTT: 38 s — AB (ref 24–36)

## 2017-06-19 LAB — I-STAT TROPONIN, ED: Troponin i, poc: 0.03 ng/mL (ref 0.00–0.08)

## 2017-06-19 LAB — CBG MONITORING, ED: GLUCOSE-CAPILLARY: 229 mg/dL — AB (ref 65–99)

## 2017-06-19 LAB — PROTIME-INR
INR: 1.24
Prothrombin Time: 15.5 seconds — ABNORMAL HIGH (ref 11.4–15.2)

## 2017-06-19 MED ORDER — INSULIN PEN NEEDLE 30G X 8 MM MISC: 3 refills | Status: DC

## 2017-06-19 MED ORDER — IOPAMIDOL (ISOVUE-370) INJECTION 76%
100.0000 mL | Freq: Once | INTRAVENOUS | Status: AC | PRN
  Administered 2017-06-19: 100 mL via INTRAVENOUS

## 2017-06-19 NOTE — ED Provider Notes (Signed)
[facility] EMERGENCY DEPARTMENT Provider Note   CSN: [id number] Arrival date & time: 06/19/17  2230   An emergency department physician performed an initial assessment on this suspected stroke patient at 2240.  History   Chief Complaint Chief Complaint  Patient presents with  . Code Stroke    HPI Jaclyn [name] is a 82 y.o. female.   82 year old female with a history of hypertension, atrial fibrillation on chronic Eliquis, cerebrovascular disease, dyslipidemia, diabetes presents to the emergency department for speech difficulty.  Patient last known normal at 2130 tonight.  Patient reportedly with "trouble getting her words out".  She has had similar episodes in the past, but today patient also reporting decreased sensation in her left upper extremity.  She has no complaints of headache at this time.  No associated nausea or vomiting, extremity weakness, recent illness or fever.  Code stroke activated by Dr. Vanita Panda in triage.     Past Medical History:  Diagnosis Date  . Atrial fibrillation ([place])   . Benign neoplasm of colon   . Carotid artery occlusion    60-79% right ICA stenosis  . Carotid bruit   . Cerebrovascular disease, unspecified   . Difficult intubation 2008   During surgery to remove large polyp  . Diverticulosis of colon (without mention of hemorrhage)   . Dysthymic disorder   . Fibromyalgia   . Headache(784.0)   . Insomnia, unspecified   . Interstitial cystitis   . Myalgia and myositis, unspecified   . Osteoarthrosis, unspecified whether generalized or localized, unspecified site   . Other and unspecified hyperlipidemia   . Other chest pain   . Other specified benign mammary dysplasias   . TIA (transient ischemic attack)   . Type II or unspecified type diabetes mellitus without mention of complication, not stated as uncontrolled   . Unspecified essential hypertension   . Unspecified hypothyroidism   . Unspecified vitamin D deficiency     . Urinary tract infection, site not specified     Patient Active Problem List   Diagnosis Date Noted  . Peripheral edema 07/29/2016  . Decreased alertness 07/07/2016  . Imbalance 04/07/2016  . Vaginal candidiasis 04/26/2015  . Hard of hearing 04/12/2015  . TIA (transient ischemic attack) 04/10/2015  . History of recurrent UTIs 04/10/2015  . Dysuria 03/09/2015  . Tremor 05/11/2014  . Leg weakness, bilateral 05/11/2014  . Fatty liver 08/04/2013  . Personal history of colonic polyps 10/05/2012  . Esophageal reflux 10/05/2012  . Carotid stenosis, asymptomatic 10/14/2010  . ANXIETY DEPRESSION 01/13/2008  . Essential hypertension, benign 01/13/2008  . PAROXYSMAL ATRIAL FIBRILLATION 01/13/2008  . CEREBROVASCULAR DISEASE 01/13/2008  . DIVERTICULOSIS OF COLON 01/13/2008  . Fibromyalgia 01/13/2008  . CHEST PAIN, ATYPICAL 01/13/2008  . Hypothyroidism 04/13/2007  . Poorly controlled diabetes mellitus ([place]) 04/13/2007  . Vitamin D deficiency 04/13/2007  . Hyperlipidemia LDL goal <70 04/13/2007  . DEGENERATIVE JOINT DISEASE 04/13/2007    Past Surgical History:  Procedure Laterality Date  . ABDOMINAL HYSTERECTOMY  1988   cervical dysplasia  . CARDIAC CATHETERIZATION  02/19/06   EF 60%  . COLONOSCOPY    . RIGHT COLECTOMY  2005   for villous adenoma of the cecum Dr.streck  . VESICOVAGINAL FISTULA CLOSURE W/ TAH  1990   w/ cystocele &retocele repairs Dr. Ree Edman     OB History   None      Home Medications    Prior to Admission medications   Medication Sig Start Date End Date Taking?  Authorizing Provider  apixaban (ELIQUIS) 5 MG TABS tablet Take 1 tablet (5 mg total) by mouth 2 (two) times daily. 07/29/16  Yes Bedsole, Amy E, MD  carvedilol (COREG) 6.25 MG tablet Take 1 tablet (6.25 mg total) by mouth 2 (two) times daily. 01/13/17  Yes Josue Hector, MD  DULoxetine (CYMBALTA) 30 MG capsule TAKE 2 CAPSULES BY MOUTH ONCE DAILY 12/05/16  Yes Bedsole, Amy E, MD  ezetimibe (ZETIA)  10 MG tablet TAKE ONE TABLET BY MOUTH ONCE DAILY 12/05/16  Yes Bedsole, Amy E, MD  glipiZIDE (GLUCOTROL) 10 MG tablet TAKE 1 TABLET BY MOUTH ONCE DAILY BEFORE BREAKFAST 05/18/17  Yes Bedsole, Amy E, MD  Insulin Glargine (LANTUS SOLOSTAR) 100 UNIT/ML Solostar Pen Inject 85 Units at bedtime into the skin. 12/26/16  Yes Bedsole, Amy E, MD  levothyroxine (SYNTHROID, LEVOTHROID) 112 MCG tablet TAKE 1 TABLET BY MOUTH ONCE DAILY 04/28/17  Yes Bedsole, Amy E, MD  metFORMIN (GLUCOPHAGE-XR) 500 MG 24 hr tablet TAKE 3 TABLETS BY MOUTH ONCE DAILY WITH BREAKFAST Patient taking differently: TAKE 2 TABLETS BY MOUTH ONCE DAILY WITH BREAKFAST 04/28/17  Yes Bedsole, Amy E, MD  trimethoprim (TRIMPEX) 100 MG tablet TAKE ONE TABLET BY MOUTH ONCE DAILY FOR UTI PROPHYLAXSIS 07/29/16  Yes Bedsole, Amy E, MD  Insulin Pen Needle (NOVOFINE) 30G X 8 MM MISC Use to inject insulin once daily.  Dx: E11.65 06/19/17   Jinny Sanders, MD    Family History Family History  Problem Relation Age of Onset  . Lymphoma Father   . Hypertension Father   . Stroke Father   . Hyperlipidemia Father   . Hypertension Mother   . Allergies Brother   . Hypertension Sister        3  . Breast cancer Sister   . Fibromyalgia Sister        3  . Hyperlipidemia Sister        3    Social History Social History   Tobacco Use  . Smoking status: Never Smoker  . Smokeless tobacco: Never Used  Substance Use Topics  . Alcohol use: No    Alcohol/week: 0.0 oz  . Drug use: No     Allergies   Naproxen sodium; Statins; Sulfonamide derivatives; Latex; and Shellfish allergy   Review of Systems Review of Systems Ten systems reviewed and are negative for acute change, except as noted in the HPI.    Physical Exam Updated Vital Signs BP (!) 190/77 (BP Location: Right Arm)   Pulse 91   Temp 98.4 F (36.9 C) (Oral)   Resp 16   Ht 5' 2.5" (1.588 m)   Wt 72.6 kg (160 lb)   SpO2 99%   BMI 28.80 kg/m   Physical Exam  Constitutional: She is  oriented to person, place, and time. She appears well-developed and well-nourished. No distress.  Nontoxic appearing. Bizarre affect.  HENT:  Head: Normocephalic and atraumatic.  Eyes: Conjunctivae and EOM are normal. No scleral icterus.  Neck: Normal range of motion.  Cardiovascular: Normal rate, regular rhythm and intact distal pulses.  Pulmonary/Chest: Effort normal. No stridor. No respiratory distress. She has no wheezes.  Respirations even and unlabored  Musculoskeletal: Normal range of motion.  Neurological: She is alert and oriented to person, place, and time. She exhibits normal muscle tone. Coordination normal.  GCS 15. Intermittent, mild slurred speech; goal oriented. No cranial nerve deficits appreciated; symmetric eyebrow raise, no facial drooping, tongue midline. Patient has equal grip strength bilaterally with 5/5  strength against resistance in all major muscle groups bilaterally. Sensation to light touch intact; subjective decreased sensation in the LUE. Patient moves extremities without ataxia. No pronator drift.  Skin: Skin is warm and dry. No rash noted. She is not diaphoretic. No erythema. No pallor.  Psychiatric: She has a normal mood and affect. Her behavior is normal.  Nursing note and vitals reviewed.    ED Treatments / Results  Labs (all labs ordered are listed, but only abnormal results are displayed) Labs Reviewed  PROTIME-INR - Abnormal; Notable for the following components:      Result Value   Prothrombin Time 15.5 (*)    All other components within normal limits  APTT - Abnormal; Notable for the following components:   aPTT 38 (*)    All other components within normal limits  COMPREHENSIVE METABOLIC PANEL - Abnormal; Notable for the following components:   Glucose, Bld 271 (*)    All other components within normal limits  CBG MONITORING, ED - Abnormal; Notable for the following components:   Glucose-Capillary 229 (*)    All other components within normal  limits  I-STAT CHEM 8, ED - Abnormal; Notable for the following components:   Glucose, Bld 263 (*)    Calcium, Ion 1.10 (*)    All other components within normal limits  ETHANOL  CBC  DIFFERENTIAL  RAPID URINE DRUG SCREEN, HOSP PERFORMED  URINALYSIS, ROUTINE W REFLEX MICROSCOPIC  I-STAT TROPONIN, ED    EKG EKG Interpretation  Date/Time:  Friday Jun 19 2017 23:28:34 EDT Ventricular Rate:  85 PR Interval:    QRS Duration: 100 QT Interval:  411 QTC Calculation: 489 R Axis:   -81 Text Interpretation:  Sinus rhythm Left anterior fascicular block Low voltage, precordial leads RSR' in V1 or V2, right VCD or RVH Consider anterior infarct Interpretation limited secondary to artifact Confirmed by Ripley Fraise [phone]) on 06/19/2017 11:41:21 PM   Radiology Ct Angio Head W Or Wo Contrast  Result Date: 06/19/2017 CLINICAL DATA:  82 y/o  F; left-sided weakness and slurred speech. EXAM: CT ANGIOGRAPHY HEAD AND NECK CT PERFUSION BRAIN TECHNIQUE: Multidetector CT imaging of the head and neck was performed using the standard protocol during bolus administration of intravenous contrast. Multiplanar CT image reconstructions and MIPs were obtained to evaluate the vascular anatomy. Carotid stenosis measurements (when applicable) are obtained utilizing NASCET criteria, using the distal internal carotid diameter as the denominator. Multiphase CT imaging of the brain was performed following IV bolus contrast injection. Subsequent parametric perfusion maps were calculated using RAPID software. CONTRAST:  142mL ISOVUE-370 IOPAMIDOL (ISOVUE-370) INJECTION 76% COMPARISON:  06/19/2017 CT head. 07/12/2015 MRI and MRA of the head. FINDINGS: CTA NECK FINDINGS Aortic arch: Standard branching. Imaged portion shows no evidence of aneurysm or dissection. No significant stenosis of the major arch vessel origins. Moderate calcific atherosclerosis. Right carotid system: Right proximal ICA severe greater than 70% stenosis due  to dense calcified plaque (series 7, image 204). Accurate assessment of luminal stenosis is suboptimal due to dense calcification. No dissection, aneurysm, or occlusion. Left carotid system: Left proximal ICA moderate 50-70% stenosis with calcified plaque. No dissection, aneurysm, or occlusion. Vertebral arteries: Right dominant. No evidence of dissection, stenosis (50% or greater) or occlusion. Skeleton: Moderate cervical spondylosis with discogenic degenerative changes predominantly at the C5-C7 levels and right upper cervical facet arthrosis. No high-grade bony canal stenosis. Prominent disc bulge at the C4-5 level approximately moderate canal stenosis. Other neck: Negative. Upper chest: Scarring in the lung apices.  Review of the MIP images confirms the above findings CTA HEAD FINDINGS Anterior circulation: Moderate 50% right horizontal petrous ICA segment of stenosis (series 7, image 134). Severe calcified plaque of carotid siphons with severe right and moderate left paraclinoid ICA stenosis. Bilateral M1 mild stenosis. No large vessel occlusion, aneurysm, or vascular malformation identified. Posterior circulation: Bilateral P1 mild stenosis. Bilateral P2 segments of mild-to-moderate stenosis. Left V1 segment of mild stenosis. No large vessel occlusion, aneurysm, or vascular malformation identified. Venous sinuses: As permitted by contrast timing, patent. Anatomic variants: Patent right posterior communicating artery. No anterior communicating artery and left posterior communicating artery identified, likely hypoplastic or absent. Review of the MIP images confirms the above findings CT Brain Perfusion Findings: CBF (<30%) Volume: 66mL Perfusion (Tmax>6.0s) volume: 72mL Mismatch Volume: 29mL Infarction Location:Not identified. IMPRESSION: 1. Negative CT brain perfusion. 2. No large vessel occlusion, dissection, aneurysm, or vascular malformation. 3. Right proximal ICA severe greater than 70% stenosis due to dense  calcified plaque. 4. Left proximal ICA moderate 50-70% stenosis with calcified plaque. 5. Right horizontal petrous ICA moderate 50% segment of stenosis. 6. Severe right and moderate left paraclinoid ICA stenosis with calcified plaque. 7. Patent bilateral vertebral arteries. No dissection, aneurysm, or significant stenosis. 8. Multiple segments of mild-to-moderate stenosis throughout the anterior and posterior intracranial circulations. These results were called by telephone at the time of interpretation on 06/19/2017 at 11:44 pm to Dr. Amie Portland , who verbally acknowledged these results. Electronically Signed   By: Kristine Garbe M.D.   On: 06/19/2017 23:51   Ct Angio Neck W Or Wo Contrast  Result Date: 06/19/2017 CLINICAL DATA:  82 y/o  F; left-sided weakness and slurred speech. EXAM: CT ANGIOGRAPHY HEAD AND NECK CT PERFUSION BRAIN TECHNIQUE: Multidetector CT imaging of the head and neck was performed using the standard protocol during bolus administration of intravenous contrast. Multiplanar CT image reconstructions and MIPs were obtained to evaluate the vascular anatomy. Carotid stenosis measurements (when applicable) are obtained utilizing NASCET criteria, using the distal internal carotid diameter as the denominator. Multiphase CT imaging of the brain was performed following IV bolus contrast injection. Subsequent parametric perfusion maps were calculated using RAPID software. CONTRAST:  150mL ISOVUE-370 IOPAMIDOL (ISOVUE-370) INJECTION 76% COMPARISON:  06/19/2017 CT head. 07/12/2015 MRI and MRA of the head. FINDINGS: CTA NECK FINDINGS Aortic arch: Standard branching. Imaged portion shows no evidence of aneurysm or dissection. No significant stenosis of the major arch vessel origins. Moderate calcific atherosclerosis. Right carotid system: Right proximal ICA severe greater than 70% stenosis due to dense calcified plaque (series 7, image 204). Accurate assessment of luminal stenosis is suboptimal  due to dense calcification. No dissection, aneurysm, or occlusion. Left carotid system: Left proximal ICA moderate 50-70% stenosis with calcified plaque. No dissection, aneurysm, or occlusion. Vertebral arteries: Right dominant. No evidence of dissection, stenosis (50% or greater) or occlusion. Skeleton: Moderate cervical spondylosis with discogenic degenerative changes predominantly at the C5-C7 levels and right upper cervical facet arthrosis. No high-grade bony canal stenosis. Prominent disc bulge at the C4-5 level approximately moderate canal stenosis. Other neck: Negative. Upper chest: Scarring in the lung apices. Review of the MIP images confirms the above findings CTA HEAD FINDINGS Anterior circulation: Moderate 50% right horizontal petrous ICA segment of stenosis (series 7, image 134). Severe calcified plaque of carotid siphons with severe right and moderate left paraclinoid ICA stenosis. Bilateral M1 mild stenosis. No large vessel occlusion, aneurysm, or vascular malformation identified. Posterior circulation: Bilateral P1 mild stenosis. Bilateral P2 segments  of mild-to-moderate stenosis. Left V1 segment of mild stenosis. No large vessel occlusion, aneurysm, or vascular malformation identified. Venous sinuses: As permitted by contrast timing, patent. Anatomic variants: Patent right posterior communicating artery. No anterior communicating artery and left posterior communicating artery identified, likely hypoplastic or absent. Review of the MIP images confirms the above findings CT Brain Perfusion Findings: CBF (<30%) Volume: 72mL Perfusion (Tmax>6.0s) volume: 39mL Mismatch Volume: 48mL Infarction Location:Not identified. IMPRESSION: 1. Negative CT brain perfusion. 2. No large vessel occlusion, dissection, aneurysm, or vascular malformation. 3. Right proximal ICA severe greater than 70% stenosis due to dense calcified plaque. 4. Left proximal ICA moderate 50-70% stenosis with calcified plaque. 5. Right  horizontal petrous ICA moderate 50% segment of stenosis. 6. Severe right and moderate left paraclinoid ICA stenosis with calcified plaque. 7. Patent bilateral vertebral arteries. No dissection, aneurysm, or significant stenosis. 8. Multiple segments of mild-to-moderate stenosis throughout the anterior and posterior intracranial circulations. These results were called by telephone at the time of interpretation on 06/19/2017 at 11:44 pm to Dr. Amie Portland , who verbally acknowledged these results. Electronically Signed   By: Kristine Garbe M.D.   On: 06/19/2017 23:51   Ct Cerebral Perfusion W Contrast  Result Date: 06/19/2017 CLINICAL DATA:  82 y/o  F; left-sided weakness and slurred speech. EXAM: CT ANGIOGRAPHY HEAD AND NECK CT PERFUSION BRAIN TECHNIQUE: Multidetector CT imaging of the head and neck was performed using the standard protocol during bolus administration of intravenous contrast. Multiplanar CT image reconstructions and MIPs were obtained to evaluate the vascular anatomy. Carotid stenosis measurements (when applicable) are obtained utilizing NASCET criteria, using the distal internal carotid diameter as the denominator. Multiphase CT imaging of the brain was performed following IV bolus contrast injection. Subsequent parametric perfusion maps were calculated using RAPID software. CONTRAST:  155mL ISOVUE-370 IOPAMIDOL (ISOVUE-370) INJECTION 76% COMPARISON:  06/19/2017 CT head. 07/12/2015 MRI and MRA of the head. FINDINGS: CTA NECK FINDINGS Aortic arch: Standard branching. Imaged portion shows no evidence of aneurysm or dissection. No significant stenosis of the major arch vessel origins. Moderate calcific atherosclerosis. Right carotid system: Right proximal ICA severe greater than 70% stenosis due to dense calcified plaque (series 7, image 204). Accurate assessment of luminal stenosis is suboptimal due to dense calcification. No dissection, aneurysm, or occlusion. Left carotid system: Left  proximal ICA moderate 50-70% stenosis with calcified plaque. No dissection, aneurysm, or occlusion. Vertebral arteries: Right dominant. No evidence of dissection, stenosis (50% or greater) or occlusion. Skeleton: Moderate cervical spondylosis with discogenic degenerative changes predominantly at the C5-C7 levels and right upper cervical facet arthrosis. No high-grade bony canal stenosis. Prominent disc bulge at the C4-5 level approximately moderate canal stenosis. Other neck: Negative. Upper chest: Scarring in the lung apices. Review of the MIP images confirms the above findings CTA HEAD FINDINGS Anterior circulation: Moderate 50% right horizontal petrous ICA segment of stenosis (series 7, image 134). Severe calcified plaque of carotid siphons with severe right and moderate left paraclinoid ICA stenosis. Bilateral M1 mild stenosis. No large vessel occlusion, aneurysm, or vascular malformation identified. Posterior circulation: Bilateral P1 mild stenosis. Bilateral P2 segments of mild-to-moderate stenosis. Left V1 segment of mild stenosis. No large vessel occlusion, aneurysm, or vascular malformation identified. Venous sinuses: As permitted by contrast timing, patent. Anatomic variants: Patent right posterior communicating artery. No anterior communicating artery and left posterior communicating artery identified, likely hypoplastic or absent. Review of the MIP images confirms the above findings CT Brain Perfusion Findings: CBF (<30%) Volume: 29mL Perfusion (Tmax>6.0s)  volume: 16mL Mismatch Volume: 2mL Infarction Location:Not identified. IMPRESSION: 1. Negative CT brain perfusion. 2. No large vessel occlusion, dissection, aneurysm, or vascular malformation. 3. Right proximal ICA severe greater than 70% stenosis due to dense calcified plaque. 4. Left proximal ICA moderate 50-70% stenosis with calcified plaque. 5. Right horizontal petrous ICA moderate 50% segment of stenosis. 6. Severe right and moderate left paraclinoid  ICA stenosis with calcified plaque. 7. Patent bilateral vertebral arteries. No dissection, aneurysm, or significant stenosis. 8. Multiple segments of mild-to-moderate stenosis throughout the anterior and posterior intracranial circulations. These results were called by telephone at the time of interpretation on 06/19/2017 at 11:44 pm to Dr. Amie Portland , who verbally acknowledged these results. Electronically Signed   By: Kristine Garbe M.D.   On: 06/19/2017 23:51   Ct Head Code Stroke Wo Contrast  Result Date: 06/19/2017 CLINICAL DATA:  Code stroke. 83 year old female with left side weakness and slurred speech last seen normal 2130 hours. EXAM: CT HEAD WITHOUT CONTRAST TECHNIQUE: Contiguous axial images were obtained from the base of the skull through the vertex without intravenous contrast. COMPARISON:  Brain MRI, intracranial MRA, and head CT without contrast 07/12/2015. FINDINGS: Brain: Stable cerebral volume since 2017 including mild ventricular prominence. Patchy and confluent bilateral cerebral white matter hypodensity appears stable in both hemispheres. No midline shift, ventriculomegaly, mass effect, evidence of mass lesion, intracranial hemorrhage or evidence of cortically based acute infarction. Chronic dystrophic calcifications in the basal ganglia. No cortical encephalomalacia identified. Vascular: Calcified atherosclerosis at the skull base. No suspicious intracranial vascular hyperdensity. Skull: No acute osseous abnormality identified. Sinuses/Orbits: Visualized paranasal sinuses and mastoids are stable and well pneumatized. Other: Orbits soft tissues are stable and within normal limits. No acute scalp soft tissue finding. ASPECTS ([place] Stroke Program Early CT Score) - Ganglionic level infarction (caudate, lentiform nuclei, internal capsule, insula, M1-M3 cortex): 7 - Supraganglionic infarction (M4-M6 cortex): 3 Total score (0-10 with 10 being normal): 10 IMPRESSION: 1. Stable non  contrast CT appearance of the brain since 2017. 2. ASPECTS is 10. 3. These results were communicated to Dr. Rory Percy at 11:00 pmon 5/3/2019by text page via the [facility] messaging system. Electronically Signed   By: Genevie Ann M.D.   On: 06/19/2017 23:00    Procedures Procedures (including critical care time)  Medications Ordered in ED Medications  iopamidol (ISOVUE-370) 76 % injection 100 mL (100 mLs Intravenous Contrast Given 06/19/17 2305)     Initial Impression / Assessment and Plan / ED Course  I have reviewed the triage vital signs and the nursing notes.  Pertinent labs & imaging results that were available during my care of the patient were reviewed by me and considered in my medical decision making (see chart for details).     82 year old female presents to the emergency department for evaluation of slurred speech with subjective left upper extremity sensation decreased.  She has no associated weakness or significant focal deficits on her exam today.  Code stroke was called in triage with stable CT head and angiogram.  The patient has been evaluated by Dr. Rory Percy of Neurohospitalist service.  Plan for admission to [facility] for continued observation and management.   Final Clinical Impressions(s) / ED Diagnoses   Final diagnoses:  Altered mental status, unspecified altered mental status type  Slurred speech    ED Discharge Orders    None       Antonietta Breach, PA-C 06/20/17 0011    Carmin Muskrat, MD 06/20/17 [phone]

## 2017-06-19 NOTE — ED Notes (Signed)
ED Provider at bedside. 

## 2017-06-19 NOTE — ED Notes (Signed)
Code stroke activated @ 0175

## 2017-06-19 NOTE — ED Triage Notes (Signed)
Pt in [place], [place] 2130. Pt reports she was having "troubling getting her words out" No aphasia noted in triage. However, pt reports sensory deficits to L side. Pt also has weird affect in triage almost as if pt is drunk but pt denies drinking. Per son pt "gets like this" when she has her "stroke episodes" Dr. Vanita Panda in triage to see pt.

## 2017-06-19 NOTE — Code Documentation (Signed)
Responded to Code Stroke called at 2240.  Pt to ED for L sensory deficits and aphasia.  [id number]. CBG-271, NIH-2 for L sensory and dysarthria. CT negative for acute changes.  CTP negative for LVO.  TPA not given as patient takes eliquis.  Plan to admit to medical team with close neuro monitoring.

## 2017-06-20 ENCOUNTER — Encounter (HOSPITAL_COMMUNITY): Payer: Self-pay | Admitting: Internal Medicine

## 2017-06-20 ENCOUNTER — Observation Stay (HOSPITAL_COMMUNITY): Payer: Medicare Other

## 2017-06-20 ENCOUNTER — Observation Stay (HOSPITAL_BASED_OUTPATIENT_CLINIC_OR_DEPARTMENT_OTHER): Payer: Medicare Other

## 2017-06-20 DIAGNOSIS — I63231 Cerebral infarction due to unspecified occlusion or stenosis of right carotid arteries: Secondary | ICD-10-CM | POA: Diagnosis not present

## 2017-06-20 DIAGNOSIS — I6521 Occlusion and stenosis of right carotid artery: Secondary | ICD-10-CM | POA: Diagnosis not present

## 2017-06-20 DIAGNOSIS — I639 Cerebral infarction, unspecified: Secondary | ICD-10-CM | POA: Diagnosis not present

## 2017-06-20 DIAGNOSIS — M6281 Muscle weakness (generalized): Secondary | ICD-10-CM | POA: Diagnosis not present

## 2017-06-20 DIAGNOSIS — G459 Transient cerebral ischemic attack, unspecified: Secondary | ICD-10-CM | POA: Diagnosis not present

## 2017-06-20 DIAGNOSIS — R4781 Slurred speech: Secondary | ICD-10-CM

## 2017-06-20 DIAGNOSIS — Z91040 Latex allergy status: Secondary | ICD-10-CM | POA: Diagnosis not present

## 2017-06-20 DIAGNOSIS — I4891 Unspecified atrial fibrillation: Secondary | ICD-10-CM

## 2017-06-20 DIAGNOSIS — Z794 Long term (current) use of insulin: Secondary | ICD-10-CM | POA: Diagnosis not present

## 2017-06-20 DIAGNOSIS — Z79899 Other long term (current) drug therapy: Secondary | ICD-10-CM | POA: Diagnosis not present

## 2017-06-20 DIAGNOSIS — Z87440 Personal history of urinary (tract) infections: Secondary | ICD-10-CM | POA: Diagnosis not present

## 2017-06-20 DIAGNOSIS — E1159 Type 2 diabetes mellitus with other circulatory complications: Secondary | ICD-10-CM

## 2017-06-20 DIAGNOSIS — I10 Essential (primary) hypertension: Secondary | ICD-10-CM

## 2017-06-20 DIAGNOSIS — E119 Type 2 diabetes mellitus without complications: Secondary | ICD-10-CM | POA: Diagnosis not present

## 2017-06-20 DIAGNOSIS — Z7901 Long term (current) use of anticoagulants: Secondary | ICD-10-CM | POA: Diagnosis not present

## 2017-06-20 DIAGNOSIS — Z8673 Personal history of transient ischemic attack (TIA), and cerebral infarction without residual deficits: Secondary | ICD-10-CM | POA: Diagnosis present

## 2017-06-20 DIAGNOSIS — R2681 Unsteadiness on feet: Secondary | ICD-10-CM | POA: Diagnosis not present

## 2017-06-20 DIAGNOSIS — I480 Paroxysmal atrial fibrillation: Secondary | ICD-10-CM | POA: Diagnosis not present

## 2017-06-20 DIAGNOSIS — E039 Hypothyroidism, unspecified: Secondary | ICD-10-CM | POA: Diagnosis not present

## 2017-06-20 LAB — GLUCOSE, CAPILLARY
GLUCOSE-CAPILLARY: 162 mg/dL — AB (ref 65–99)
GLUCOSE-CAPILLARY: 77 mg/dL (ref 65–99)
GLUCOSE-CAPILLARY: 206 mg/dL — AB (ref 65–99)
Glucose-Capillary: 317 mg/dL — ABNORMAL HIGH (ref 65–99)

## 2017-06-20 LAB — RAPID URINE DRUG SCREEN, HOSP PERFORMED
Amphetamines: NOT DETECTED
BARBITURATES: NOT DETECTED
BENZODIAZEPINES: NOT DETECTED
COCAINE: NOT DETECTED
OPIATES: NOT DETECTED
Tetrahydrocannabinol: NOT DETECTED

## 2017-06-20 LAB — URINALYSIS, ROUTINE W REFLEX MICROSCOPIC
Bilirubin Urine: NEGATIVE
Glucose, UA: 150 mg/dL — AB
Hgb urine dipstick: NEGATIVE
Ketones, ur: NEGATIVE mg/dL
Nitrite: POSITIVE — AB
PROTEIN: NEGATIVE mg/dL
Specific Gravity, Urine: 1.04 — ABNORMAL HIGH (ref 1.005–1.030)
WBC, UA: >50 WBC/hpf — ABNORMAL HIGH (ref 0–5)
pH: 6 (ref 5.0–8.0)

## 2017-06-20 LAB — HEMOGLOBIN A1C
HEMOGLOBIN A1C: 8.8 % — AB (ref 4.8–5.6)
MEAN PLASMA GLUCOSE: 205.86 mg/dL

## 2017-06-20 LAB — LIPID PANEL
CHOL/HDL RATIO: 4.6 ratio
Cholesterol: 161 mg/dL (ref 0–200)
HDL: 35 mg/dL — ABNORMAL LOW (ref >40)
LDL CALC: 79 mg/dL (ref 0–99)
Triglycerides: 237 mg/dL — ABNORMAL HIGH (ref <150)
VLDL: 47 mg/dL — ABNORMAL HIGH (ref 0–40)

## 2017-06-20 MED ORDER — INSULIN GLARGINE 100 UNIT/ML ~~LOC~~ SOLN
85.0000 [IU] | Freq: Every day | SUBCUTANEOUS | Status: DC
  Administered 2017-06-20 (×2): 85 [IU] via SUBCUTANEOUS
  Filled 2017-06-20 (×3): qty 0.85

## 2017-06-20 MED ORDER — ACETAMINOPHEN 325 MG PO TABS: 650.0000 mg | ORAL_TABLET | ORAL | Status: DC | PRN

## 2017-06-20 MED ORDER — SODIUM CHLORIDE 0.9 % IV BOLUS
500.0000 mL | Freq: Once | INTRAVENOUS | Status: AC
  Administered 2017-06-20: 500 mL via INTRAVENOUS

## 2017-06-20 MED ORDER — ACETAMINOPHEN 160 MG/5ML PO SOLN: 650.0000 mg | ORAL | Status: DC | PRN

## 2017-06-20 MED ORDER — CARVEDILOL 12.5 MG PO TABS
6.2500 mg | ORAL_TABLET | Freq: Two times a day (BID) | ORAL | Status: DC
  Administered 2017-06-20: 6.25 mg via ORAL
  Filled 2017-06-20: qty 1

## 2017-06-20 MED ORDER — APIXABAN 5 MG PO TABS
5.0000 mg | ORAL_TABLET | Freq: Two times a day (BID) | ORAL | Status: DC
  Administered 2017-06-20 – 2017-06-21 (×3): 5 mg via ORAL
  Filled 2017-06-20 (×3): qty 1

## 2017-06-20 MED ORDER — EZETIMIBE 10 MG PO TABS
10.0000 mg | ORAL_TABLET | Freq: Every day | ORAL | Status: DC
  Administered 2017-06-20 – 2017-06-21 (×2): 10 mg via ORAL
  Filled 2017-06-20 (×2): qty 1

## 2017-06-20 MED ORDER — DULOXETINE HCL 60 MG PO CPEP
60.0000 mg | ORAL_CAPSULE | Freq: Every day | ORAL | Status: DC
  Administered 2017-06-20 – 2017-06-21 (×2): 60 mg via ORAL
  Filled 2017-06-20 (×2): qty 1

## 2017-06-20 MED ORDER — INSULIN GLARGINE 100 UNIT/ML SOLOSTAR PEN: 85.0000 [IU] | PEN_INJECTOR | Freq: Every day | SUBCUTANEOUS | Status: DC

## 2017-06-20 MED ORDER — SODIUM CHLORIDE 0.9 % IV SOLN
INTRAVENOUS | Status: DC
  Administered 2017-06-20 – 2017-06-21 (×2): via INTRAVENOUS

## 2017-06-20 MED ORDER — TRIMETHOPRIM 100 MG PO TABS: 100.0000 mg | ORAL_TABLET | Freq: Every day | ORAL | Status: DC

## 2017-06-20 MED ORDER — INSULIN ASPART 100 UNIT/ML ~~LOC~~ SOLN
0.0000 [IU] | Freq: Three times a day (TID) | SUBCUTANEOUS | Status: DC
  Administered 2017-06-20: 3 [IU] via SUBCUTANEOUS
  Administered 2017-06-20 – 2017-06-21 (×3): 2 [IU] via SUBCUTANEOUS

## 2017-06-20 MED ORDER — ASPIRIN 325 MG PO TABS
325.0000 mg | ORAL_TABLET | Freq: Every day | ORAL | Status: DC
  Administered 2017-06-20: 325 mg via ORAL
  Filled 2017-06-20: qty 1

## 2017-06-20 MED ORDER — HYDRALAZINE HCL 20 MG/ML IJ SOLN: 10.0000 mg | INTRAMUSCULAR | Status: DC | PRN

## 2017-06-20 MED ORDER — SODIUM CHLORIDE 0.9 % IV SOLN: INTRAVENOUS | Status: DC

## 2017-06-20 MED ORDER — APIXABAN 5 MG PO TABS: 5.0000 mg | ORAL_TABLET | Freq: Two times a day (BID) | ORAL | Status: DC

## 2017-06-20 MED ORDER — ACETAMINOPHEN 650 MG RE SUPP: 650.0000 mg | RECTAL | Status: DC | PRN

## 2017-06-20 MED ORDER — ASPIRIN EC 81 MG PO TBEC
81.0000 mg | DELAYED_RELEASE_TABLET | Freq: Every day | ORAL | Status: DC
  Administered 2017-06-21: 81 mg via ORAL
  Filled 2017-06-20 (×2): qty 1

## 2017-06-20 MED ORDER — STROKE: EARLY STAGES OF RECOVERY BOOK
Freq: Once | Status: AC
  Administered 2017-06-20: 06:00:00
  Filled 2017-06-20 (×2): qty 1

## 2017-06-20 MED ORDER — LEVOTHYROXINE SODIUM 112 MCG PO TABS
112.0000 ug | ORAL_TABLET | Freq: Every day | ORAL | Status: DC
  Administered 2017-06-20 – 2017-06-21 (×2): 112 ug via ORAL
  Filled 2017-06-20 (×2): qty 1

## 2017-06-20 NOTE — Progress Notes (Addendum)
Neurology Progress Note  Reason for Consult: Code stroke Referring Physician: Slurred speech, left-sided weakness  CC: Slurred speech  HPI: Jaclyn [name] is a 82 y.o. female was a past medical history of atrial fibrillation currently on Eliquis, known right ICA stenosis. She presents with multiple TIAs in the past involving the left side of the body as well as speech changes. On 5/3, she started noticing that her speech was very slurred.  She reports having felt like her speech sounded like a person who has had alcohol intoxication. Upon initial exam, there was some left side numbness as well.    Interval History: 5/4: Symptoms seems to all resolve per pt, then this am when she got up to work w/rehab team, she suddenly began to have very slurred speech again. BP was 122/60. She was laid flat, IVF started and MRI changed to stat.  ROS: ROS was performed and is negative except as noted in the HPI.   Medications  Current Facility-Administered Medications:  .  0.9 %  sodium chloride infusion, , Intravenous, Continuous, Gherghe, Costin M, MD .  acetaminophen (TYLENOL) tablet 650 mg, 650 mg, Oral, Q4H PRN **OR** acetaminophen (TYLENOL) solution 650 mg, 650 mg, Per Tube, Q4H PRN **OR** acetaminophen (TYLENOL) suppository 650 mg, 650 mg, Rectal, Q4H PRN, Rise Patience, MD .  aspirin tablet 325 mg, 325 mg, Oral, Daily, Amie Portland, MD, 325 mg at 06/20/17 0753 .  carvedilol (COREG) tablet 6.25 mg, 6.25 mg, Oral, BID WC, Rise Patience, MD, 6.25 mg at 06/20/17 0753 .  DULoxetine (CYMBALTA) DR capsule 60 mg, 60 mg, Oral, Daily, Rise Patience, MD, 60 mg at 06/20/17 0754 .  ezetimibe (ZETIA) tablet 10 mg, 10 mg, Oral, Daily, Rise Patience, MD, 10 mg at 06/20/17 0753 .  hydrALAZINE (APRESOLINE) injection 10 mg, 10 mg, Intravenous, Q4H PRN, Rise Patience, MD .  insulin aspart (novoLOG) injection 0-9 Units, 0-9 Units, Subcutaneous, TID WC, Rise Patience, MD, 2  Units at 06/20/17 [phone]  insulin glargine (LANTUS) injection 85 Units, 85 Units, Subcutaneous, QHS, Rise Patience, MD, 85 Units at 06/20/17 0118 .  levothyroxine (SYNTHROID, LEVOTHROID) tablet 112 mcg, 112 mcg, Oral, QAC breakfast, Rise Patience, MD, 112 mcg at 06/20/17 0753  Exam: Current vital signs: BP (P) 129/62 (BP Location: Right Arm)   Pulse 77   Temp (P) 98.3 F (36.8 C) (Oral)   Resp (P) 18   Ht 5\' 2"  (1.575 m)   Wt 72.6 kg (160 lb)   SpO2 (P) 96%   BMI 29.26 kg/m  Vital signs in last 24 hours: Temp:  [97.9 F (36.6 C)-99.6 F (37.6 C)] (P) 98.3 F (36.8 C) (05/04 0855) Pulse Rate:  [59-91] 77 (05/04 0545) Resp:  [15-22] (P) 18 (05/04 0855) BP: (114-190)/(54-86) (P) 129/62 (05/04 0855) SpO2:  [94 %-99 %] (P) 96 % (05/04 0855) Weight:  [72.6 kg (160 lb)] 72.6 kg (160 lb) (05/04 0140) General: Awake alert no apparent distress H ENT: Normal cephalic atraumatic dry mucous membranes, poor dentition especially in the mandibular teeth.  Right carotid bruit positive. Very HOH CVS: S1-S2 regular rate rhythm Respirate: Chest clear to auscultation Extremities: Warm well perfused Neurological exam Patient is awake, alert, oriented x3.  Mildly reduced attention concentration.  Perseverates at times. She is extremely hard of hearing but is able to name, repeat and comprehend normally. Her speech is mod dysarthric at this time. Cranial nerves: Pupils equal round reactive to light, extraocular movements intact, visual  fields full, no facial asymmetry, facial sensation intact, auditory acuity moderately reduced bilaterally, palate elevates symmetrically, shoulder shrug intact, tongue midline. Motor exam: 5/5 in all 4 extremities with normal range of motion Sensory exam: Intact to light touch on the right but mildly decreased sensation on the left arm compared to the right leg curretly. Coordination: Intact finger-nose-finger Gait, slightly ataxic-did not formally test  gait but observed while she got out of the chair and got onto the CT scanner table. NIH stroke scale-2 (1 for dysarthria, 1 for sensory)  Labs I have reviewed labs in epic and the results pertinent to this consultation are:  CBC    Component Value Date/Time   WBC 8.1 06/19/2017 2247   RBC 4.17 06/19/2017 2247   HGB 12.9 06/19/2017 2254   HCT 38.0 06/19/2017 2254   PLT 261 06/19/2017 2247   MCV 90.9 06/19/2017 2247   MCH 30.5 06/19/2017 2247   MCHC 33.5 06/19/2017 2247   RDW 13.9 06/19/2017 2247   LYMPHSABS 2.3 06/19/2017 2247   MONOABS 0.6 06/19/2017 2247   EOSABS 0.2 06/19/2017 2247   BASOSABS 0.0 06/19/2017 2247    CMP     Component Value Date/Time   NA 141 06/19/2017 2254   K 3.9 06/19/2017 2254   CL 103 06/19/2017 2254   CO2 24 06/19/2017 2247   GLUCOSE 263 (H) 06/19/2017 2254   BUN 8 06/19/2017 2254   CREATININE 0.70 06/19/2017 2254   CREATININE 0.77 01/26/2015 1700   CALCIUM 9.0 06/19/2017 2247   PROT 7.0 06/19/2017 2247   ALBUMIN 3.7 06/19/2017 2247   AST 28 06/19/2017 2247   ALT 23 06/19/2017 2247   ALKPHOS 75 06/19/2017 2247   BILITOT 0.7 06/19/2017 2247   GFRNONAA >60 06/19/2017 2247   GFRAA >60 06/19/2017 2247  LDL 128 in March 2018   Imaging I have reviewed the images obtained thus far and also reviewed 2017 MRI for comparison:  CT-scan of the brain-no acute changes CT angiogram-no LVO but shows more than 70% right carotid stenosis, moderate stenosis of the left carotid as well. Posterior circulation shows no significant stenosis.   CT perfusion study does not reveal any perfusion deficits.  Assessment:  82 year old woman with TIAs. There was no LVO on CT angiogram and perfusion studies hence not a candidate for EVT. Stroke risks: A. fib on Eliquis, right ICA stenosis with multiple TIAs involving the left side of the body, diabetes, hyperlipidemia   # Transient neurologic symptoms- She does have significant right ICA stenosis and her symptoms  now for multiple times has been left-sided numbness or weakness along with dysarthria, making for high suspicion for a large vessel atherosclerotic thrombotic right hemispheric stroke/TIA at this time.  # Rt ICA stenosis- ASA started. Will consult vascular surgery once MRI is completed to evaluate stroke burden # DM2 w/hyperglycemia- A1C 8.8, SSI for tight glucose control # Afib- rate controlled. Continue on Eliquis as long as no bleed seen on MR # HTN- hold all BP meds at this time as she acutely worsens with drop in BP    Recommendations: -Change MRI to stat, I have called MR myself to facilitate -NS bolus 500mg  now, then NS @75ml /hr -Bed rest for now, HOB flat -Avoid any drop in BP; permissive hypertension SBP >220 mmHg -Echocardiogram pending -Continue telemetry monitoring & Frequent neuro checks -Hold Coreg for now -change ASA 81mg  daily once Eliquis is restarted; given the stenosis want an antiplt on as well.  -Planning to restart Eliquis post  MRI today -Would recommend to be on statin but she has adverse reactions to statin.  Renew Zetia. -Risk factor modification -PT consult, OT consult, Speech consult -After MRI is done to better eval for stroke burden, will consult vascular consult for CEA for the symptomatic RICA stenosis  Please page stroke NP/PA/MD (listed on AMION)  from 8am-4 pm as this patient will be followed by the stroke team at this point.  Desiree Metzger-Cihelka, ARNP-C [facility]  ATTENDING NOTE: I reviewed above note and agree with the assessment and plan. I have made any additions or clarifications directly to the above note. Pt was seen and examined.   82 year old female with history of A. fib on Eliquis, right ICA stenosis, DM, HTN, HLD, history of TIA admitted for slurred speech and left arm numbness.  She had a TIA in 10/2014 with expressive aphasia and gait difficulty, lasted 2 hours.  MRI and TTE unremarkable, carotid Doppler right ICA 50-69  percent stenosis.  LDL 133 and A1c 8.6.  She was put on Eliquis for stroke prevention.  She had again TIA in 06/2015 due to left sided weakness and slurred speech, left facial droop.  She ran out of Eliquis 1 week PTA.  CT, MRI, MRA unremarkable.  Carotid Doppler bilateral 40 to 59% ICA stenosis.  EF 60 to 65%.  LDL 128 and A1c 10.1.  Patient was continued on Eliquis.  This time, patient admitted again for slurred speech and left arm numbness.  CT of the neck showed right ICA 70% stenosis, left ICA 50 to 70% stenosis.  Severe right, moderate left ICA siphon stenosis, bilateral M1 mild to moderate stenosis.  CT perfusion negative.  LDL 79 and A1c 8.8.  UDS negative.  This morning, patient was working with PT, and then had worsening slurred speech, put back in bed, symptoms resolved shortly.  MRI brain did not show any acute infarct.  Eliquis resumed, also added aspirin 81 for stroke prevention.  Continue with Zetia.  Recommend vascular surgery consultation to consider right CEA.  Avoid hypotension, BP goal 130-150 prior to CEA procedure.  Risk factor modification. Discussed with Dr. Cruzita Lederer.    Jaclyn Hawking, MD PhD Stroke Neurology 06/20/2017 3:10 PM

## 2017-06-20 NOTE — Consult Note (Addendum)
Neurology Consultation  Reason for Consult: Code stroke Referring Physician: Slurred speech, left-sided weakness  CC: Slurred speech, left-sided weakness  History is obtained from: Patient, chart, patient's son  HPI: Jaclyn [name] is a 83 y.o. female was a past medical history of atrial fibrillation currently on Eliquis, right ICA stenosis, diabetes, hyperlipidemia t, fibromyalgia, headaches, multiple TIAs in the past involving the left side of the body, who was in her usual state of health till about 9:30 PM when she started noticing that her speech was very slurred.  She reports having felt like her speech sounded like a person who has had alcohol intoxication.  She reports that her prior TIAs had similar presentations.  She also reports some numbness on her left arm, which upon further questioning she says has probably been present since earlier this morning. She denies any current headaches.  Denies any visual disturbances.  Denies any word finding difficulty.  Denies any comprehension issues.  Denies any chest pain shortness of breath.  Denies nausea vomiting.  Denies preceding illnesses or sicknesses.  She has been complaining of urinary retention and difficulty urinating especially since this morning.  Denies any burning micturition.  She says after episode in 2017 with a TIA, she has not been able to walk normally, and uses a walker for most part.   LKW: Speech symptoms- 9:30 PM, left arm numbness since 9 AM this morning-off and on. tpa given?: no, low NIH stroke scale, some symptoms present since 9 AM which makes her outside the window Premorbid modified Rankin scale (mRS): 2  ROS: ROS was performed and is negative except as noted in the HPI.    Past Medical History:  Diagnosis Date  . Atrial fibrillation ([place])   . Benign neoplasm of colon   . Carotid artery occlusion    60-79% right ICA stenosis  . Carotid bruit   . Cerebrovascular disease, unspecified   . Difficult  intubation 2008   During surgery to remove large polyp  . Diverticulosis of colon (without mention of hemorrhage)   . Dysthymic disorder   . Fibromyalgia   . Headache(784.0)   . Insomnia, unspecified   . Interstitial cystitis   . Myalgia and myositis, unspecified   . Osteoarthrosis, unspecified whether generalized or localized, unspecified site   . Other and unspecified hyperlipidemia   . Other chest pain   . Other specified benign mammary dysplasias   . TIA (transient ischemic attack)   . Type II or unspecified type diabetes mellitus without mention of complication, not stated as uncontrolled   . Unspecified essential hypertension   . Unspecified hypothyroidism   . Unspecified vitamin D deficiency   . Urinary tract infection, site not specified    Family History  Problem Relation Age of Onset  . Lymphoma Father   . Hypertension Father   . Stroke Father   . Hyperlipidemia Father   . Hypertension Mother   . Allergies Brother   . Hypertension Sister        3  . Breast cancer Sister   . Fibromyalgia Sister        3  . Hyperlipidemia Sister        3   Social History:   reports that she has never smoked. She has never used smokeless tobacco. She reports that she does not drink alcohol or use drugs.  Medications No current facility-administered medications for this encounter.   Current Outpatient Medications:  .  apixaban (ELIQUIS) 5 MG TABS tablet, Take  1 tablet (5 mg total) by mouth 2 (two) times daily., Disp: 180 tablet, Rfl: 1 .  carvedilol (COREG) 6.25 MG tablet, Take 1 tablet (6.25 mg total) by mouth 2 (two) times daily., Disp: 180 tablet, Rfl: 3 .  DULoxetine (CYMBALTA) 30 MG capsule, TAKE 2 CAPSULES BY MOUTH ONCE DAILY, Disp: 180 capsule, Rfl: 1 .  ezetimibe (ZETIA) 10 MG tablet, TAKE ONE TABLET BY MOUTH ONCE DAILY, Disp: 90 tablet, Rfl: 1 .  glipiZIDE (GLUCOTROL) 10 MG tablet, TAKE 1 TABLET BY MOUTH ONCE DAILY BEFORE BREAKFAST, Disp: 90 tablet, Rfl: 1 .  Insulin  Glargine (LANTUS SOLOSTAR) 100 UNIT/ML Solostar Pen, Inject 85 Units at bedtime into the skin., Disp: 15 pen, Rfl: 11 .  levothyroxine (SYNTHROID, LEVOTHROID) 112 MCG tablet, TAKE 1 TABLET BY MOUTH ONCE DAILY, Disp: 90 tablet, Rfl: 1 .  metFORMIN (GLUCOPHAGE-XR) 500 MG 24 hr tablet, TAKE 3 TABLETS BY MOUTH ONCE DAILY WITH BREAKFAST (Patient taking differently: TAKE 2 TABLETS BY MOUTH ONCE DAILY WITH BREAKFAST), Disp: 90 tablet, Rfl: 1 .  trimethoprim (TRIMPEX) 100 MG tablet, TAKE ONE TABLET BY MOUTH ONCE DAILY FOR UTI PROPHYLAXSIS, Disp: 90 tablet, Rfl: 3 .  Insulin Pen Needle (NOVOFINE) 30G X 8 MM MISC, Use to inject insulin once daily.  Dx: E11.65, Disp: 90 each, Rfl: 3  Exam: Current vital signs: BP (!) 190/77 (BP Location: Right Arm)   Pulse 91   Temp 98.4 F (36.9 C) (Oral)   Resp 16   Ht 5' 2.5" (1.588 m)   Wt 72.6 kg (160 lb)   SpO2 99%   BMI 28.80 kg/m  Vital signs in last 24 hours: Temp:  [98.4 F (36.9 C)] 98.4 F (36.9 C) (05/03 2236) Pulse Rate:  [91] 91 (05/03 2236) Resp:  [16] 16 (05/03 2236) BP: (190)/(77) 190/77 (05/03 2236) SpO2:  [99 %] 99 % (05/03 2236) Weight:  [72.6 kg (160 lb)] 72.6 kg (160 lb) (05/03 2325) General: Awake alert no apparent distress H ENT: Normal cephalic atraumatic dry mucous membranes, poor dentition especially in the mandibular teeth.  Right carotid bruit positive CVS: S1-S2 regular rate rhythm Respirate: Chest clear to auscultation Extremities: Warm well perfused Neurological exam Patient is awake, alert, oriented x3.  Mildly reduced attention concentration.  Perseverates at times. She is extremely hard of hearing but is able to name, repeat and comprehend normally. Her speech is moderately dysarthric. Cranial nerves: Pupils equal round reactive to light, extraocular movements intact, visual fields full, no facial asymmetry, facial sensation intact, auditory acuity moderately reduced bilaterally, palate elevates symmetrically, shoulder  shrug intact, tongue midline. Motor exam: 5/5 in all 4 extremities with normal range of motion Sensory exam: Intact to light touch on the right but mildly decreased sensation on the left arm compared to the right arm. Coordination: Intact finger-nose-finger Gait, slightly ataxic-did not formally test gait but observed while she got out of the chair and got onto the CT scanner table. NIH stroke scale-2 (1 for dysarthria, 1 for sensory) Labs I have reviewed labs in epic and the results pertinent to this consultation are:  CBC    Component Value Date/Time   WBC 8.1 06/19/2017 2247   RBC 4.17 06/19/2017 2247   HGB 12.9 06/19/2017 2254   HCT 38.0 06/19/2017 2254   PLT 261 06/19/2017 2247   MCV 90.9 06/19/2017 2247   MCH 30.5 06/19/2017 2247   MCHC 33.5 06/19/2017 2247   RDW 13.9 06/19/2017 2247   LYMPHSABS 2.3 06/19/2017 2247   MONOABS 0.6  06/19/2017 2247   EOSABS 0.2 06/19/2017 2247   BASOSABS 0.0 06/19/2017 2247    CMP     Component Value Date/Time   NA 141 06/19/2017 2254   K 3.9 06/19/2017 2254   CL 103 06/19/2017 2254   CO2 24 06/19/2017 2247   GLUCOSE 263 (H) 06/19/2017 2254   BUN 8 06/19/2017 2254   CREATININE 0.70 06/19/2017 2254   CREATININE 0.77 01/26/2015 1700   CALCIUM 9.0 06/19/2017 2247   PROT 7.0 06/19/2017 2247   ALBUMIN 3.7 06/19/2017 2247   AST 28 06/19/2017 2247   ALT 23 06/19/2017 2247   ALKPHOS 75 06/19/2017 2247   BILITOT 0.7 06/19/2017 2247   GFRNONAA >60 06/19/2017 2247   GFRAA >60 06/19/2017 2247  LDL 128 in March 2018   Imaging I have reviewed the images obtained:  CT-scan of the brain-no acute changes CT angiogram-no LVO but shows more than 70% right carotid stenosis, moderate stenosis of the left carotid as well. Posterior circulation shows no significant stenosis.   CT perfusion study does not reveal any perfusion deficits.  Assessment:  82 year old woman past history of A. fib on Eliquis, right ICA stenosis with multiple TIAs  involving the left side of the body, diabetes, hyperlipidemia, fibromyalgia presenting for evaluation of left-sided numbness and slurred speech.  Some of her symptoms-left-sided numbness started this morning while her speech symptoms to the best of my history taking started around 9:30 PM. Given that the start of her symptoms was earlier this morning, she is outside the window for IV TPA.  Her NIH stroke scale also was only 2. There was no LVO on CT angiogram and perfusion studies hence not a candidate for EVT. She does have significant right ICA stenosis and her symptoms now for multiple times has been left-sided numbness or weakness along with dysarthria, making my suspicion for a right hemispheric stroke/TIA at this time. Review of system positive for urinary symptoms. ? Recrudescence of symptoms in the setting of UTI  Impression: Evaluate for right hemispheric stroke/TIA Symptomatic right ICA stenosis Evaluate for underlying UTI causing recrudescence of symptoms  Recommendations: -Admit to hospitalist -Telemetry monitoring -Allow for permissive hypertension for the first 24-48h - only treat PRN if SBP >220 mmHg. Blood pressures can be gradually normalized to SBP<140 upon discharge. -MRI brain without contrast -Echocardiogram -HgbA1c, fasting lipid panel -Frequent neuro checks -Prophylactic therapy- Hold eliquis. ASA 325 x1 now. Decision on anticoag resumption after MRI -Would recommend to be on statin but she has adverse reactions to statin.  Renew Zetia. -Risk factor modification -PT consult, OT consult, Speech consult -Check urinalysis and chest x-ray -Consider vascular consult for CEA for the RICA stenosis  Please page stroke NP/PA/MD (listed on AMION)  from 8am-4 pm as this patient will be followed by the stroke team at this point.   -- Amie Portland, MD [facility] Pager: [phone] If 7pm to 7am, please call on call as listed on AMION.

## 2017-06-20 NOTE — H&P (Signed)
History and Physical    [name] NWG:[id number] DOB: [birth year] DOA: 06/19/2017  PCP: Jinny Sanders, MD  Patient coming from: Home.  Chief Complaint: Slurred speech.  HPI: Jaclyn [name] is a 82 y.o. female with history of atrial fibrillation, TIA, right ICA stenosis, hypertension, diabetes mellitus presents to the ER after patient started experiencing slurred speech while at a restaurant at around 9:30 PM.  Patient's son was with the patient.  Patient felt like she was speaking like a drunk.  Patient also had some left-sided upper extremity numbness which patient states she has had since morning.  Denies any weakness of the upper or lower extremities.  Slurred speech resolved by half hour.  ED Course: In the ER patient was appearing nonfocal.  CT head followed by CT angiogram of the head and neck and perfusion study showed right ICA stenosis.  Patient admitted for further stroke versus TIA work-up.  Review of Systems: As per HPI, rest all negative.   Past Medical History:  Diagnosis Date  . Atrial fibrillation ([place])   . Benign neoplasm of colon   . Carotid artery occlusion    60-79% right ICA stenosis  . Carotid bruit   . Cerebrovascular disease, unspecified   . Difficult intubation 2008   During surgery to remove large polyp  . Diverticulosis of colon (without mention of hemorrhage)   . Dysthymic disorder   . Fibromyalgia   . Headache(784.0)   . Insomnia, unspecified   . Interstitial cystitis   . Myalgia and myositis, unspecified   . Osteoarthrosis, unspecified whether generalized or localized, unspecified site   . Other and unspecified hyperlipidemia   . Other chest pain   . Other specified benign mammary dysplasias   . TIA (transient ischemic attack)   . Type II or unspecified type diabetes mellitus without mention of complication, not stated as uncontrolled   . Unspecified essential hypertension   . Unspecified hypothyroidism   . Unspecified vitamin D deficiency    . Urinary tract infection, site not specified     Past Surgical History:  Procedure Laterality Date  . ABDOMINAL HYSTERECTOMY  1988   cervical dysplasia  . CARDIAC CATHETERIZATION  02/19/06   EF 60%  . COLONOSCOPY    . RIGHT COLECTOMY  2005   for villous adenoma of the cecum Dr.streck  . VESICOVAGINAL FISTULA CLOSURE W/ TAH  1990   w/ cystocele &retocele repairs Dr. Ree Edman     reports that she has never smoked. She has never used smokeless tobacco. She reports that she does not drink alcohol or use drugs.  Allergies  Allergen Reactions  . Naproxen Sodium Other (See Comments)    Fever/aches and pains  . Statins     myalgias  . Sulfonamide Derivatives Hives and Itching  . Latex Itching and Rash  . Shellfish Allergy Itching, Swelling and Rash    Seafood, shrimp    Family History  Problem Relation Age of Onset  . Lymphoma Father   . Hypertension Father   . Stroke Father   . Hyperlipidemia Father   . Hypertension Mother   . Allergies Brother   . Hypertension Sister        3  . Breast cancer Sister   . Fibromyalgia Sister        3  . Hyperlipidemia Sister        3    Prior to Admission medications   Medication Sig Start Date End Date Taking? Authorizing Provider  apixaban (ELIQUIS) 5  MG TABS tablet Take 1 tablet (5 mg total) by mouth 2 (two) times daily. 07/29/16  Yes Bedsole, Amy E, MD  carvedilol (COREG) 6.25 MG tablet Take 1 tablet (6.25 mg total) by mouth 2 (two) times daily. 01/13/17  Yes Josue Hector, MD  DULoxetine (CYMBALTA) 30 MG capsule TAKE 2 CAPSULES BY MOUTH ONCE DAILY 12/05/16  Yes Bedsole, Amy E, MD  ezetimibe (ZETIA) 10 MG tablet TAKE ONE TABLET BY MOUTH ONCE DAILY 12/05/16  Yes Bedsole, Amy E, MD  glipiZIDE (GLUCOTROL) 10 MG tablet TAKE 1 TABLET BY MOUTH ONCE DAILY BEFORE BREAKFAST 05/18/17  Yes Bedsole, Amy E, MD  Insulin Glargine (LANTUS SOLOSTAR) 100 UNIT/ML Solostar Pen Inject 85 Units at bedtime into the skin. 12/26/16  Yes Bedsole, Amy E, MD    levothyroxine (SYNTHROID, LEVOTHROID) 112 MCG tablet TAKE 1 TABLET BY MOUTH ONCE DAILY 04/28/17  Yes Bedsole, Amy E, MD  metFORMIN (GLUCOPHAGE-XR) 500 MG 24 hr tablet TAKE 3 TABLETS BY MOUTH ONCE DAILY WITH BREAKFAST Patient taking differently: TAKE 2 TABLETS BY MOUTH ONCE DAILY WITH BREAKFAST 04/28/17  Yes Bedsole, Amy E, MD  trimethoprim (TRIMPEX) 100 MG tablet TAKE ONE TABLET BY MOUTH ONCE DAILY FOR UTI PROPHYLAXSIS 07/29/16  Yes Bedsole, Amy E, MD  Insulin Pen Needle (NOVOFINE) 30G X 8 MM MISC Use to inject insulin once daily.  Dx: E11.65 06/19/17   Jinny Sanders, MD    Physical Exam: Vitals:   06/19/17 2236 06/19/17 2325  BP: (!) 190/77   Pulse: 91   Resp: 16   Temp: 98.4 F (36.9 C)   TempSrc: Oral   SpO2: 99%   Weight:  72.6 kg (160 lb)  Height:  5' 2.5" (1.588 m)      Constitutional: Moderately built and nourished. Vitals:   06/19/17 2236 06/19/17 2325  BP: (!) 190/77   Pulse: 91   Resp: 16   Temp: 98.4 F (36.9 C)   TempSrc: Oral   SpO2: 99%   Weight:  72.6 kg (160 lb)  Height:  5' 2.5" (1.588 m)   Eyes: Anicteric no pallor. ENMT: No discharge from the ears eyes nose or mouth. Neck: No mass felt.  No neck rigidity. Respiratory: No rhonchi or crepitations. Cardiovascular: S1-S2 heard no murmurs appreciated. Abdomen: Soft nontender bowel sounds present. Musculoskeletal: No edema.  No joint effusion. Skin: No rash.  Skin appears warm. Neurologic: Alert awake oriented to time place and person.  Moves all extremities 5 x 5.  No facial asymmetry tongue is midline.  Pupils are equal and reacting to light. Psychiatric: Appears normal.  Normal affect.   Labs on Admission: I have personally reviewed following labs and imaging studies  CBC: Recent Labs  Lab 06/19/17 2247 06/19/17 2254  WBC 8.1  --   NEUTROABS 5.0  --   HGB 12.7 12.9  HCT 37.9 38.0  MCV 90.9  --   PLT 261  --    Basic Metabolic Panel: Recent Labs  Lab 06/19/17 2247 06/19/17 2254  NA 138  141  K 3.9 3.9  CL 102 103  CO2 24  --   GLUCOSE 271* 263*  BUN 9 8  CREATININE 0.86 0.70  CALCIUM 9.0  --    GFR: Estimated Creatinine Clearance: 53.8 mL/min (by C-G formula based on SCr of 0.7 mg/dL). Liver Function Tests: Recent Labs  Lab 06/19/17 2247  AST 28  ALT 23  ALKPHOS 75  BILITOT 0.7  PROT 7.0  ALBUMIN 3.7   No results  for input(s): LIPASE, AMYLASE in the last 168 hours. No results for input(s): AMMONIA in the last 168 hours. Coagulation Profile: Recent Labs  Lab 06/19/17 2247  INR 1.24   Cardiac Enzymes: No results for input(s): CKTOTAL, CKMB, CKMBINDEX, TROPONINI in the last 168 hours. BNP (last 3 results) No results for input(s): PROBNP in the last 8760 hours. HbA1C: No results for input(s): HGBA1C in the last 72 hours. CBG: Recent Labs  Lab 06/19/17 2236  GLUCAP 229*   Lipid Profile: No results for input(s): CHOL, HDL, LDLCALC, TRIG, CHOLHDL, LDLDIRECT in the last 72 hours. Thyroid Function Tests: No results for input(s): TSH, T4TOTAL, FREET4, T3FREE, THYROIDAB in the last 72 hours. Anemia Panel: No results for input(s): VITAMINB12, FOLATE, FERRITIN, TIBC, IRON, RETICCTPCT in the last 72 hours. Urine analysis:    Component Value Date/Time   COLORURINE YELLOW 07/11/2015 2318   APPEARANCEUR TURBID (A) 07/11/2015 2318   LABSPEC 1.031 (H) 07/11/2015 2318   PHURINE 6.0 07/11/2015 2318   GLUCOSEU >1000 (A) 07/11/2015 2318   GLUCOSEU NEGATIVE 10/08/2012 1148   HGBUR NEGATIVE 07/11/2015 2318   BILIRUBINUR negative 05/09/2016 1534   KETONESUR 15 (A) 07/11/2015 2318   PROTEINUR negative 05/09/2016 1534   PROTEINUR NEGATIVE 07/11/2015 2318   UROBILINOGEN 0.2 05/09/2016 1534   UROBILINOGEN 0.2 11/01/2014 2113   NITRITE negative 05/09/2016 1534   NITRITE NEGATIVE 07/11/2015 2318   LEUKOCYTESUR Negative 05/09/2016 1534   Sepsis Labs: @LABRCNTIP (procalcitonin:4,lacticidven:4) )No results found for this or any previous visit (from the past 240  hour(s)).   Radiological Exams on Admission: Ct Angio Head W Or Wo Contrast  Result Date: 06/19/2017 CLINICAL DATA:  82 y/o  F; left-sided weakness and slurred speech. EXAM: CT ANGIOGRAPHY HEAD AND NECK CT PERFUSION BRAIN TECHNIQUE: Multidetector CT imaging of the head and neck was performed using the standard protocol during bolus administration of intravenous contrast. Multiplanar CT image reconstructions and MIPs were obtained to evaluate the vascular anatomy. Carotid stenosis measurements (when applicable) are obtained utilizing NASCET criteria, using the distal internal carotid diameter as the denominator. Multiphase CT imaging of the brain was performed following IV bolus contrast injection. Subsequent parametric perfusion maps were calculated using RAPID software. CONTRAST:  154mL ISOVUE-370 IOPAMIDOL (ISOVUE-370) INJECTION 76% COMPARISON:  06/19/2017 CT head. 07/12/2015 MRI and MRA of the head. FINDINGS: CTA NECK FINDINGS Aortic arch: Standard branching. Imaged portion shows no evidence of aneurysm or dissection. No significant stenosis of the major arch vessel origins. Moderate calcific atherosclerosis. Right carotid system: Right proximal ICA severe greater than 70% stenosis due to dense calcified plaque (series 7, image 204). Accurate assessment of luminal stenosis is suboptimal due to dense calcification. No dissection, aneurysm, or occlusion. Left carotid system: Left proximal ICA moderate 50-70% stenosis with calcified plaque. No dissection, aneurysm, or occlusion. Vertebral arteries: Right dominant. No evidence of dissection, stenosis (50% or greater) or occlusion. Skeleton: Moderate cervical spondylosis with discogenic degenerative changes predominantly at the C5-C7 levels and right upper cervical facet arthrosis. No high-grade bony canal stenosis. Prominent disc bulge at the C4-5 level approximately moderate canal stenosis. Other neck: Negative. Upper chest: Scarring in the lung apices. Review  of the MIP images confirms the above findings CTA HEAD FINDINGS Anterior circulation: Moderate 50% right horizontal petrous ICA segment of stenosis (series 7, image 134). Severe calcified plaque of carotid siphons with severe right and moderate left paraclinoid ICA stenosis. Bilateral M1 mild stenosis. No large vessel occlusion, aneurysm, or vascular malformation identified. Posterior circulation: Bilateral P1 mild stenosis. Bilateral  P2 segments of mild-to-moderate stenosis. Left V1 segment of mild stenosis. No large vessel occlusion, aneurysm, or vascular malformation identified. Venous sinuses: As permitted by contrast timing, patent. Anatomic variants: Patent right posterior communicating artery. No anterior communicating artery and left posterior communicating artery identified, likely hypoplastic or absent. Review of the MIP images confirms the above findings CT Brain Perfusion Findings: CBF (<30%) Volume: 43mL Perfusion (Tmax>6.0s) volume: 21mL Mismatch Volume: 5mL Infarction Location:Not identified. IMPRESSION: 1. Negative CT brain perfusion. 2. No large vessel occlusion, dissection, aneurysm, or vascular malformation. 3. Right proximal ICA severe greater than 70% stenosis due to dense calcified plaque. 4. Left proximal ICA moderate 50-70% stenosis with calcified plaque. 5. Right horizontal petrous ICA moderate 50% segment of stenosis. 6. Severe right and moderate left paraclinoid ICA stenosis with calcified plaque. 7. Patent bilateral vertebral arteries. No dissection, aneurysm, or significant stenosis. 8. Multiple segments of mild-to-moderate stenosis throughout the anterior and posterior intracranial circulations. These results were called by telephone at the time of interpretation on 06/19/2017 at 11:44 pm to Dr. Amie Portland , who verbally acknowledged these results. Electronically Signed   By: Kristine Garbe M.D.   On: 06/19/2017 23:51   Ct Angio Neck W Or Wo Contrast  Result Date:  06/19/2017 CLINICAL DATA:  82 y/o  F; left-sided weakness and slurred speech. EXAM: CT ANGIOGRAPHY HEAD AND NECK CT PERFUSION BRAIN TECHNIQUE: Multidetector CT imaging of the head and neck was performed using the standard protocol during bolus administration of intravenous contrast. Multiplanar CT image reconstructions and MIPs were obtained to evaluate the vascular anatomy. Carotid stenosis measurements (when applicable) are obtained utilizing NASCET criteria, using the distal internal carotid diameter as the denominator. Multiphase CT imaging of the brain was performed following IV bolus contrast injection. Subsequent parametric perfusion maps were calculated using RAPID software. CONTRAST:  112mL ISOVUE-370 IOPAMIDOL (ISOVUE-370) INJECTION 76% COMPARISON:  06/19/2017 CT head. 07/12/2015 MRI and MRA of the head. FINDINGS: CTA NECK FINDINGS Aortic arch: Standard branching. Imaged portion shows no evidence of aneurysm or dissection. No significant stenosis of the major arch vessel origins. Moderate calcific atherosclerosis. Right carotid system: Right proximal ICA severe greater than 70% stenosis due to dense calcified plaque (series 7, image 204). Accurate assessment of luminal stenosis is suboptimal due to dense calcification. No dissection, aneurysm, or occlusion. Left carotid system: Left proximal ICA moderate 50-70% stenosis with calcified plaque. No dissection, aneurysm, or occlusion. Vertebral arteries: Right dominant. No evidence of dissection, stenosis (50% or greater) or occlusion. Skeleton: Moderate cervical spondylosis with discogenic degenerative changes predominantly at the C5-C7 levels and right upper cervical facet arthrosis. No high-grade bony canal stenosis. Prominent disc bulge at the C4-5 level approximately moderate canal stenosis. Other neck: Negative. Upper chest: Scarring in the lung apices. Review of the MIP images confirms the above findings CTA HEAD FINDINGS Anterior circulation: Moderate  50% right horizontal petrous ICA segment of stenosis (series 7, image 134). Severe calcified plaque of carotid siphons with severe right and moderate left paraclinoid ICA stenosis. Bilateral M1 mild stenosis. No large vessel occlusion, aneurysm, or vascular malformation identified. Posterior circulation: Bilateral P1 mild stenosis. Bilateral P2 segments of mild-to-moderate stenosis. Left V1 segment of mild stenosis. No large vessel occlusion, aneurysm, or vascular malformation identified. Venous sinuses: As permitted by contrast timing, patent. Anatomic variants: Patent right posterior communicating artery. No anterior communicating artery and left posterior communicating artery identified, likely hypoplastic or absent. Review of the MIP images confirms the above findings CT Brain Perfusion Findings: CBF (<30%)  Volume: 74mL Perfusion (Tmax>6.0s) volume: 46mL Mismatch Volume: 75mL Infarction Location:Not identified. IMPRESSION: 1. Negative CT brain perfusion. 2. No large vessel occlusion, dissection, aneurysm, or vascular malformation. 3. Right proximal ICA severe greater than 70% stenosis due to dense calcified plaque. 4. Left proximal ICA moderate 50-70% stenosis with calcified plaque. 5. Right horizontal petrous ICA moderate 50% segment of stenosis. 6. Severe right and moderate left paraclinoid ICA stenosis with calcified plaque. 7. Patent bilateral vertebral arteries. No dissection, aneurysm, or significant stenosis. 8. Multiple segments of mild-to-moderate stenosis throughout the anterior and posterior intracranial circulations. These results were called by telephone at the time of interpretation on 06/19/2017 at 11:44 pm to Dr. Amie Portland , who verbally acknowledged these results. Electronically Signed   By: Kristine Garbe M.D.   On: 06/19/2017 23:51   Ct Cerebral Perfusion W Contrast  Result Date: 06/19/2017 CLINICAL DATA:  82 y/o  F; left-sided weakness and slurred speech. EXAM: CT ANGIOGRAPHY HEAD  AND NECK CT PERFUSION BRAIN TECHNIQUE: Multidetector CT imaging of the head and neck was performed using the standard protocol during bolus administration of intravenous contrast. Multiplanar CT image reconstructions and MIPs were obtained to evaluate the vascular anatomy. Carotid stenosis measurements (when applicable) are obtained utilizing NASCET criteria, using the distal internal carotid diameter as the denominator. Multiphase CT imaging of the brain was performed following IV bolus contrast injection. Subsequent parametric perfusion maps were calculated using RAPID software. CONTRAST:  120mL ISOVUE-370 IOPAMIDOL (ISOVUE-370) INJECTION 76% COMPARISON:  06/19/2017 CT head. 07/12/2015 MRI and MRA of the head. FINDINGS: CTA NECK FINDINGS Aortic arch: Standard branching. Imaged portion shows no evidence of aneurysm or dissection. No significant stenosis of the major arch vessel origins. Moderate calcific atherosclerosis. Right carotid system: Right proximal ICA severe greater than 70% stenosis due to dense calcified plaque (series 7, image 204). Accurate assessment of luminal stenosis is suboptimal due to dense calcification. No dissection, aneurysm, or occlusion. Left carotid system: Left proximal ICA moderate 50-70% stenosis with calcified plaque. No dissection, aneurysm, or occlusion. Vertebral arteries: Right dominant. No evidence of dissection, stenosis (50% or greater) or occlusion. Skeleton: Moderate cervical spondylosis with discogenic degenerative changes predominantly at the C5-C7 levels and right upper cervical facet arthrosis. No high-grade bony canal stenosis. Prominent disc bulge at the C4-5 level approximately moderate canal stenosis. Other neck: Negative. Upper chest: Scarring in the lung apices. Review of the MIP images confirms the above findings CTA HEAD FINDINGS Anterior circulation: Moderate 50% right horizontal petrous ICA segment of stenosis (series 7, image 134). Severe calcified plaque of  carotid siphons with severe right and moderate left paraclinoid ICA stenosis. Bilateral M1 mild stenosis. No large vessel occlusion, aneurysm, or vascular malformation identified. Posterior circulation: Bilateral P1 mild stenosis. Bilateral P2 segments of mild-to-moderate stenosis. Left V1 segment of mild stenosis. No large vessel occlusion, aneurysm, or vascular malformation identified. Venous sinuses: As permitted by contrast timing, patent. Anatomic variants: Patent right posterior communicating artery. No anterior communicating artery and left posterior communicating artery identified, likely hypoplastic or absent. Review of the MIP images confirms the above findings CT Brain Perfusion Findings: CBF (<30%) Volume: 77mL Perfusion (Tmax>6.0s) volume: 80mL Mismatch Volume: 50mL Infarction Location:Not identified. IMPRESSION: 1. Negative CT brain perfusion. 2. No large vessel occlusion, dissection, aneurysm, or vascular malformation. 3. Right proximal ICA severe greater than 70% stenosis due to dense calcified plaque. 4. Left proximal ICA moderate 50-70% stenosis with calcified plaque. 5. Right horizontal petrous ICA moderate 50% segment of stenosis. 6. Severe right and  moderate left paraclinoid ICA stenosis with calcified plaque. 7. Patent bilateral vertebral arteries. No dissection, aneurysm, or significant stenosis. 8. Multiple segments of mild-to-moderate stenosis throughout the anterior and posterior intracranial circulations. These results were called by telephone at the time of interpretation on 06/19/2017 at 11:44 pm to Dr. Amie Portland , who verbally acknowledged these results. Electronically Signed   By: Kristine Garbe M.D.   On: 06/19/2017 23:51   Ct Head Code Stroke Wo Contrast  Result Date: 06/19/2017 CLINICAL DATA:  Code stroke. 82 year old female with left side weakness and slurred speech last seen normal 2130 hours. EXAM: CT HEAD WITHOUT CONTRAST TECHNIQUE: Contiguous axial images were  obtained from the base of the skull through the vertex without intravenous contrast. COMPARISON:  Brain MRI, intracranial MRA, and head CT without contrast 07/12/2015. FINDINGS: Brain: Stable cerebral volume since 2017 including mild ventricular prominence. Patchy and confluent bilateral cerebral white matter hypodensity appears stable in both hemispheres. No midline shift, ventriculomegaly, mass effect, evidence of mass lesion, intracranial hemorrhage or evidence of cortically based acute infarction. Chronic dystrophic calcifications in the basal ganglia. No cortical encephalomalacia identified. Vascular: Calcified atherosclerosis at the skull base. No suspicious intracranial vascular hyperdensity. Skull: No acute osseous abnormality identified. Sinuses/Orbits: Visualized paranasal sinuses and mastoids are stable and well pneumatized. Other: Orbits soft tissues are stable and within normal limits. No acute scalp soft tissue finding. ASPECTS ([place] Stroke Program Early CT Score) - Ganglionic level infarction (caudate, lentiform nuclei, internal capsule, insula, M1-M3 cortex): 7 - Supraganglionic infarction (M4-M6 cortex): 3 Total score (0-10 with 10 being normal): 10 IMPRESSION: 1. Stable non contrast CT appearance of the brain since 2017. 2. ASPECTS is 10. 3. These results were communicated to Dr. Rory Percy at 11:00 pmon 5/3/2019by text page via the [facility] messaging system. Electronically Signed   By: Genevie Ann M.D.   On: 06/19/2017 23:00    EKG: Independently reviewed.  Normal sinus rhythm.  Assessment/Plan Principal Problem:   Acute CVA (cerebrovascular accident) [place]) Active Problems:   Hypothyroidism   Essential hypertension, benign   PAROXYSMAL ATRIAL FIBRILLATION   Type 2 diabetes mellitus with vascular disease ([place])    1. Acute CVA versus TIA -MRI brain 2D echo has been ordered.  Patient has known history of right ICA stenosis.  At this time neurology has recommended to hold apixaban and to be on  aspirin until further studies.  Physical therapy consult.  Patient intolerant to statins.  Check hemoglobin A1c and lipid panel. 2. Atrial fibrillation presently in sinus rhythm.  Apixaban on hold as per the neurologist until MRI results available.  Presently on aspirin and Coreg. 3. Hypertension uncontrolled -allow for permissive hypertension.  PRN IV hydralazine for systolic blood pressure more than 423 and diastolic more than 536.  Patient on Coreg. 4. Diabetes mellitus type 2 -takes Lantus 85 units in the morning.  Hold oral hypoglycemics for now.  On sliding scale coverage.  Hemoglobin A1c pending. 5. Hypothyroidism on Synthroid.   6. History of recurrent UTI used to be on trimethoprim which patient has not been taking now.   DVT prophylaxis: Lovenox. Code Status: Full code. Family Communication: Discussed with patient's son. Disposition Plan: Home. Consults called: Neurology. Admission status: Observation.   Rise Patience MD [facility] Pager [phone].  If 7PM-7AM, please contact night-coverage [email or web address] Password [email or web address]  06/20/2017, 12:09 AM

## 2017-06-20 NOTE — Evaluation (Signed)
Physical Therapy Evaluation Patient Details Name: Jaclyn [name] MRN: [id number] DOB: [birth year] Today's Date: 06/20/2017   History of Present Illness  Patient is a 82 y.o. F with signficiant PMH of atrial fibrillation on Eliquis, right ICA stenosis, diabetes, hyperlipidemia, fibromyalgia, headaches, multiple TIAs in the past involving the left side of her body, presenting with slurred speech and LUE numbness.   Clinical Impression  Patient presents with functional limitations in mobility and transfers due to diminished balance, decreased strength, gait deviations, and cognitive impairments. Patient is a household ambulator who intermittently uses RW for balance and is independent with ADL's. Think patient's functional mobility is currently close to baseline. Ambulated 200 feet with hand held assist. Patient will benefit from RW versus cane gait training for safety at home. At end of session, patient with increased difficulty with word finding and mildly slurred speech although she was still alert and able to answer orientation questions.No change noted in strength. RN and MD Cruzita Lederer notified and present in room. Will likely need reassessment following further testing.  Patient will benefit from continued PT to increase their independence and safety with mobility to allow discharge to home with home health PT.     Follow Up Recommendations Home health PT;Supervision for mobility/OOB    Equipment Recommendations  None recommended by PT;Other (comment)((try SPC versus RW))    Recommendations for Other Services       Precautions / Restrictions Precautions Precautions: Fall Restrictions Weight Bearing Restrictions: No      Mobility  Bed Mobility Overal bed mobility: Independent                Transfers Overall transfer level: Needs assistance Equipment used: None Transfers: Sit to/from Stand Sit to Stand: Supervision         General transfer comment: Supervision for  safety  Ambulation/Gait Ambulation/Gait assistance: Min assist Ambulation Distance (Feet): 200 Feet Assistive device: 1 person hand held assist Gait Pattern/deviations: Step-through pattern;Decreased step length - left;Shuffle     General Gait Details: Patient applying moderate reliance through LUE with hand held assist. Unable to dual task wth gait, needing to stop when speaking.   Stairs            Wheelchair Mobility    Modified Rankin (Stroke Patients Only) Modified Rankin (Stroke Patients Only) Pre-Morbid Rankin Score: Slight disability Modified Rankin: Moderately severe disability     Balance Overall balance assessment: Needs assistance Sitting-balance support: No upper extremity supported;Feet unsupported Sitting balance-Leahy Scale: Fair Sitting balance - Comments: Patient with one instance of lateral lean with manual muscle testing     Standing balance-Leahy Scale: Fair Standing balance comment: Able to stand statically with supervision, however requires additional assistance with dynamic balance                             Pertinent Vitals/Pain Pain Assessment: No/denies pain    Home Living Family/patient expects to be discharged to:: Private residence Living Arrangements: Children Available Help at Discharge: Family;Available PRN/intermittently Type of Home: House Home Access: Level entry     Home Layout: One level Home Equipment: Walker - 2 wheels      Prior Function Level of Independence: Independent with assistive device(s)         Comments: Uses RW intermittently for balance. Independent with ADL's. Mainly household ambulator and reports no falls in past month.     Hand Dominance        Extremity/Trunk Assessment  Upper Extremity Assessment Upper Extremity Assessment: RUE deficits/detail;LUE deficits/detail RUE Deficits / Details: WFL except R shoulder flexion 4/5 with crepitus noted LUE Deficits / Details: WFL     Lower Extremity Assessment Lower Extremity Assessment: RLE deficits/detail;LLE deficits/detail RLE Deficits / Details: Grossly 4/5 LLE Deficits / Details: Grossly 4/5       Communication   Communication: Expressive difficulties;HOH(difficulty word finding (see clinical impression))  Cognition Arousal/Alertness: Awake/alert Behavior During Therapy: WFL for tasks assessed/performed Overall Cognitive Status: Difficult to assess                                 General Comments: Patient is HOH. Seems to have decreased short term memory and can be tangential. Asked if MD was the priest. Patient son states cognition is at baseline.      General Comments      Exercises     Assessment/Plan    PT Assessment Patient needs continued PT services  PT Problem List Decreased strength;Decreased activity tolerance;Decreased balance;Decreased mobility;Decreased coordination;Decreased cognition       PT Treatment Interventions DME instruction;Gait training;Stair training;Functional mobility training;Therapeutic activities;Therapeutic exercise;Balance training;Patient/family education    PT Goals (Current goals can be found in the Care Plan section)  Acute Rehab PT Goals Patient Stated Goal: Go for more walks PT Goal Formulation: With patient Time For Goal Achievement: 07/04/17 Potential to Achieve Goals: Good    Frequency Min 4X/week   Barriers to discharge        Co-evaluation               AM-PAC PT "6 Clicks" Daily Activity  Outcome Measure Difficulty turning over in bed (including adjusting bedclothes, sheets and blankets)?: None Difficulty moving from lying on back to sitting on the side of the bed? : None Difficulty sitting down on and standing up from a chair with arms (e.g., wheelchair, bedside commode, etc,.)?: A Little Help needed moving to and from a bed to chair (including a wheelchair)?: A Little Help needed walking in hospital room?: A  Little Help needed climbing 3-5 steps with a railing? : A Little 6 Click Score: 20    End of Session Equipment Utilized During Treatment: Gait belt Activity Tolerance: Treatment limited secondary to medical complications (Comment) Patient left: in bed;with call bell/phone within reach;with nursing/sitter in room Nurse Communication: Mobility status;Other (comment)(difficulty with word finding) PT Visit Diagnosis: Unsteadiness on feet (R26.81);Other abnormalities of gait and mobility (R26.89);Difficulty in walking, not elsewhere classified (R26.2)    Time: 0822-0850 PT Time Calculation (min) (ACUTE ONLY): 28 min   Charges:   PT Evaluation $PT Eval Moderate Complexity: 1 Mod PT Treatments $Gait Training: 8-22 mins   PT G Codes:        Ellamae Sia, PT, DPT Acute Rehabilitation Services  Pager: [place] 06/20/2017, 9:29 AM

## 2017-06-20 NOTE — Progress Notes (Signed)
VASCULAR LAB PRELIMINARY  PRELIMINARY  PRELIMINARY  PRELIMINARY  Carotid duplex completed.    Preliminary report:  60-79% right ICA stenosis, highest end of scale.  Lesion noted origin and proximal ICA. 1-39% left ICA plaquing. Vertebral artery flow is antegrade.   Jaclyn [name], RVT 06/20/2017, 7:24 PM

## 2017-06-20 NOTE — Evaluation (Signed)
Speech Language Pathology Evaluation Patient Details Name: Jaclyn [name] MRN: [id number] DOB: [birth year] Today's Date: 06/20/2017 Time: 5956-3875 SLP Time Calculation (min) (ACUTE ONLY): 20 min  Problem List:  Patient Active Problem List   Diagnosis Date Noted  . Acute CVA (cerebrovascular accident) ([place]) 06/20/2017  . Type 2 diabetes mellitus with vascular disease ([employer]) 06/20/2017  . Slurred speech   . Stenosis of right carotid artery   . Peripheral edema 07/29/2016  . Decreased alertness 07/07/2016  . Imbalance 04/07/2016  . Vaginal candidiasis 04/26/2015  . Hard of hearing 04/12/2015  . TIA (transient ischemic attack) 04/10/2015  . History of recurrent UTIs 04/10/2015  . Dysuria 03/09/2015  . Tremor 05/11/2014  . Leg weakness, bilateral 05/11/2014  . Fatty liver 08/04/2013  . Personal history of colonic polyps 10/05/2012  . Esophageal reflux 10/05/2012  . Carotid stenosis, asymptomatic 10/14/2010  . ANXIETY DEPRESSION 01/13/2008  . Essential hypertension, benign 01/13/2008  . PAROXYSMAL ATRIAL FIBRILLATION 01/13/2008  . Intracranial vascular stenosis 01/13/2008  . DIVERTICULOSIS OF COLON 01/13/2008  . Fibromyalgia 01/13/2008  . CHEST PAIN, ATYPICAL 01/13/2008  . Hypothyroidism 04/13/2007  . Poorly controlled diabetes mellitus ([place]) 04/13/2007  . Vitamin D deficiency 04/13/2007  . Hyperlipidemia LDL goal <70 04/13/2007  . DEGENERATIVE JOINT DISEASE 04/13/2007   Past Medical History:  Past Medical History:  Diagnosis Date  . Atrial fibrillation ([place])   . Benign neoplasm of colon   . Carotid artery occlusion    60-79% right ICA stenosis  . Carotid bruit   . Cerebrovascular disease, unspecified   . Difficult intubation 2008   During surgery to remove large polyp  . Diverticulosis of colon (without mention of hemorrhage)   . Dysthymic disorder   . Fibromyalgia   . Headache(784.0)   . Insomnia, unspecified   . Interstitial cystitis   . Myalgia and myositis,  unspecified   . Osteoarthrosis, unspecified whether generalized or localized, unspecified site   . Other and unspecified hyperlipidemia   . Other chest pain   . Other specified benign mammary dysplasias   . TIA (transient ischemic attack)   . Type II or unspecified type diabetes mellitus without mention of complication, not stated as uncontrolled   . Unspecified essential hypertension   . Unspecified hypothyroidism   . Unspecified vitamin D deficiency   . Urinary tract infection, site not specified    Past Surgical History:  Past Surgical History:  Procedure Laterality Date  . ABDOMINAL HYSTERECTOMY  1988   cervical dysplasia  . CARDIAC CATHETERIZATION  02/19/06   EF 60%  . COLONOSCOPY    . RIGHT COLECTOMY  2005   for villous adenoma of the cecum Dr.streck  . VESICOVAGINAL FISTULA CLOSURE W/ TAH  1990   w/ cystocele &retocele repairs Dr. Ree Edman   HPI:  Patient is a 82 y.o. F with signficiant PMH of atrial fibrillation on Eliquis, right ICA stenosis, diabetes, hyperlipidemia, fibromyalgia, headaches, multiple TIAs in the past involving the left side of her body, presenting with slurred speech and LUE numbness.At end of PT eval, pt with difficulty with word finding and mildly slurred speech, stat MRI with no acute or subacute infarct.   Assessment / Plan / Recommendation Clinical Impression   Pt presents with fluctuating cognitive-linguistic impairments. When SLP entered room initially, pt with mild dysarthria, conversing fluently but with jargon, semantic paraphasias, neologisms, word-finding difficulties; no awareness of errors. Vascular surgeon arrived and SLP left room for approximately 15 minutes. Returned for assessment; dysarthria minimal and conversational language resolved.  Though language deficits had resolved at time of my return, pt has persisting confusion, extreme lability (frequent inappropriate laughing), functional recall deficits (pt did not recall any details from  vascular surgeon's visit, who left room just minutes prior to assessment), and severely impaired sustained attention. Pt has difficulty following multistep and complex commands. While hearing loss is a factor, suspect attention, working memory also contributing. SLP will follow acutely for further diagnostic intervention and to determine discharge needs; with current mental status pt would need at least 24 hour supervision for safety.    SLP Assessment  SLP Recommendation/Assessment: Patient needs continued Speech Lanaguage Pathology Services SLP Visit Diagnosis: Attention and concentration deficit;Dysarthria and anarthria (R47.1);Aphasia (R47.01);Cognitive communication deficit (R41.841)    Follow Up Recommendations  24 hour supervision/assistance    Frequency and Duration min 2x/week  1 week      SLP Evaluation Cognition  Overall Cognitive Status: Impaired/Different from baseline Arousal/Alertness: Awake/alert Orientation Level: Oriented to person;Oriented to place;Oriented to situation;Other (comment)(Jul 05, 2017 or 2020) Attention: Sustained Sustained Attention: Impaired Sustained Attention Impairment: Functional basic;Verbal basic Memory: Impaired Memory Impairment: Storage deficit;Decreased recall of new information;Decreased short term memory(Immediate recall 3/5, delayed recall 2/5) Decreased Short Term Memory: Functional basic;Verbal basic Awareness: Impaired Awareness Impairment: Emergent impairment Problem Solving: Impaired Problem Solving Impairment: Verbal basic;Functional basic Executive Function: Reasoning;Organizing Reasoning: Impaired Reasoning Impairment: Verbal basic Organizing: Impaired Organizing Impairment: Verbal basic Behaviors: Restless;Impulsive;Lability Safety/Judgment: Impaired       Comprehension  Auditory Comprehension Overall Auditory Comprehension: Impaired Yes/No Questions: Within Functional Limits Commands: Impaired One Step Basic Commands:  75-100% accurate Two Step Basic Commands: 75-100% accurate Multistep Basic Commands: 50-74% accurate Complex Commands: 50-74% accurate Conversation: Simple Interfering Components: Attention;Hearing;Processing speed;Working Field seismologist: Extra processing time;Increased volume;Repetition;Stressing words Visual Recognition/Discrimination Discrimination: Within Function Limits Reading Comprehension Reading Status: Not tested    Expression Expression Primary Mode of Expression: Verbal Verbal Expression Overall Verbal Expression: Impaired Level of Generative/Spontaneous Verbalization: Conversation Repetition: Impaired Level of Impairment: Sentence level Naming: Impairment Confrontation: Within functional limits Divergent: 25-49% accurate(5 fruits in 60 seconds) Verbal Errors: Neologisms;Jargon;Language of confusion;Not aware of errors Pragmatics: Impairment Impairments: Abnormal affect;Topic appropriateness;Topic maintenance Interfering Components: Attention Non-Verbal Means of Communication: Not applicable Written Expression Dominant Hand: Right Written Expression: Not tested   Oral / Motor  Oral Motor/Sensory Function Overall Oral Motor/Sensory Function: Within functional limits Motor Speech Overall Motor Speech: (fluctuating dysarthria) Respiration: Within functional limits Phonation: Normal Resonance: Within functional limits Articulation: Impaired Level of Impairment: (fluctuating dysarthria) Intelligibility: Intelligible Motor Planning: Witnin functional limits   GO                   Deneise Lever, [place], [place] Speech-Language Pathologist [place] 06/20/2017, 5:39 PM

## 2017-06-20 NOTE — Consult Note (Signed)
Referring Physician: Dr Marzetta Board  Patient name: Jaclyn [name] MRN: [id number] DOB: 1935/03/23 Sex: female  REASON FOR CONSULT: TIA with carotid stenos  HPI: Jaclyn [name] is a 82 y.o. female with 2 episodes of slurred speech over the last 24 hours.  She is a very poor historian and very hard of hearing but tends to get off track easily.  Her chart describes episodes of left arm numbness but she denies this to me.  She has had no weakness in the extremities or facial droop.  She has had no amaurosis.  She currently is on Eliquis for afib and has been on this for 2 years.  She is not on statin due to muscle aches in the past.  She states she had one prior episode of slurred speech 2 years ago but has not had another episode until now while being on Eliquis. Other medical problems include diabetes which is poorly controlled, elevated cholesterol which is poorly controlled and hypertension which is controlled but has had permissive hypertension while in hospital.  She is also on aspirin.  Past Medical History:  Diagnosis Date  . Atrial fibrillation ([place])   . Benign neoplasm of colon   . Carotid artery occlusion    60-79% right ICA stenosis  . Carotid bruit   . Cerebrovascular disease, unspecified   . Difficult intubation 2008   During surgery to remove large polyp  . Diverticulosis of colon (without mention of hemorrhage)   . Dysthymic disorder   . Fibromyalgia   . Headache(784.0)   . Insomnia, unspecified   . Interstitial cystitis   . Myalgia and myositis, unspecified   . Osteoarthrosis, unspecified whether generalized or localized, unspecified site   . Other and unspecified hyperlipidemia   . Other chest pain   . Other specified benign mammary dysplasias   . TIA (transient ischemic attack)   . Type II or unspecified type diabetes mellitus without mention of complication, not stated as uncontrolled   . Unspecified essential hypertension   . Unspecified hypothyroidism   .  Unspecified vitamin D deficiency   . Urinary tract infection, site not specified    Past Surgical History:  Procedure Laterality Date  . ABDOMINAL HYSTERECTOMY  1988   cervical dysplasia  . CARDIAC CATHETERIZATION  02/19/06   EF 60%  . COLONOSCOPY    . RIGHT COLECTOMY  2005   for villous adenoma of the cecum Dr.streck  . VESICOVAGINAL FISTULA CLOSURE W/ TAH  1990   w/ cystocele &retocele repairs Dr. Ree Edman    Family History  Problem Relation Age of Onset  . Lymphoma Father   . Hypertension Father   . Stroke Father   . Hyperlipidemia Father   . Hypertension Mother   . Allergies Brother   . Hypertension Sister        3  . Breast cancer Sister   . Fibromyalgia Sister        3  . Hyperlipidemia Sister        3    SOCIAL HISTORY: Social History   Socioeconomic History  . Marital status: Single    Spouse name: Not on file  . Number of children: 3  . Years of education: Not on file  . Highest education level: Not on file  Occupational History  . Occupation: retired    Fish farm manager: RETIRED  Social Needs  . Financial resource strain: Not on file  . Food insecurity:    Worry: Not on file  Inability: Not on file  . Transportation needs:    Medical: Not on file    Non-medical: Not on file  Tobacco Use  . Smoking status: Never Smoker  . Smokeless tobacco: Never Used  Substance and Sexual Activity  . Alcohol use: No    Alcohol/week: 0.0 oz  . Drug use: No  . Sexual activity: Not on file  Lifestyle  . Physical activity:    Days per week: Not on file    Minutes per session: Not on file  . Stress: Not on file  Relationships  . Social connections:    Talks on phone: Not on file    Gets together: Not on file    Attends religious service: Not on file    Active member of club or organization: Not on file    Attends meetings of clubs or organizations: Not on file    Relationship status: Not on file  . Intimate partner violence:    Fear of current or ex partner: Not  on file    Emotionally abused: Not on file    Physically abused: Not on file    Forced sexual activity: Not on file  Other Topics Concern  . Not on file  Social History Narrative   1 child died at 66 months old --hx of downs syndrome  Lives with son in a 2 story home.  Uses the 1st floor.  Has two living children.  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      Allergies  Allergen Reactions  . Naproxen Sodium Other (See Comments)    Fever/aches and pains  . Statins     myalgias  . Sulfonamide Derivatives Hives and Itching  . Latex Itching and Rash  . Shellfish Allergy Itching, Swelling and Rash    Seafood, shrimp    Current Facility-Administered Medications  Medication Dose Route Frequency Provider Last Rate Last Dose  . 0.9 %  sodium chloride infusion   Intravenous Continuous Caren Griffins, MD 100 mL/hr at 06/20/17 0924    . 0.9 %  sodium chloride infusion   Intravenous Continuous Metzger-Cihelka, Desiree, NP   Stopped at 06/20/17 0930  . acetaminophen (TYLENOL) tablet 650 mg  650 mg Oral Q4H PRN Rise Patience, MD       Or  . acetaminophen (TYLENOL) solution 650 mg  650 mg Per Tube Q4H PRN Rise Patience, MD       Or  . acetaminophen (TYLENOL) suppository 650 mg  650 mg Rectal Q4H PRN Rise Patience, MD      . apixaban Arne Cleveland) tablet 5 mg  5 mg Oral BID Metzger-Cihelka, Desiree, NP   5 mg at 06/20/17 1201  . [START ON 06/21/2017] aspirin EC tablet 81 mg  81 mg Oral Daily Metzger-Cihelka, Desiree, NP      . DULoxetine (CYMBALTA) DR capsule 60 mg  60 mg Oral Daily Rise Patience, MD   60 mg at 06/20/17 0754  . ezetimibe (ZETIA) tablet 10 mg  10 mg Oral Daily Rise Patience, MD   10 mg at 06/20/17 0753  . hydrALAZINE (APRESOLINE) injection 10 mg  10 mg Intravenous Q4H PRN Rise Patience, MD      . insulin aspart (novoLOG) injection 0-9 Units  0-9 Units Subcutaneous TID WC Rise Patience, MD   2 Units at 06/20/17 0754  . insulin  glargine (LANTUS) injection 85 Units  85 Units Subcutaneous QHS Rise Patience, MD   85 Units at  06/20/17 0118  . levothyroxine (SYNTHROID, LEVOTHROID) tablet 112 mcg  112 mcg Oral QAC breakfast Rise Patience, MD   112 mcg at 06/20/17 0753    ROS:   General:  No weight loss, Fever, chills  HEENT: No recent headaches, no nasal bleeding, no visual changes, no sore throat  Neurologic: No dizziness, blackouts, seizures. + recent symptoms of stroke or mini- stroke.+ recent episodes of slurred speech, no temporary blindness.  Cardiac: No recent episodes of chest pain/pressure, no shortness of breath at rest.  + shortness of breath with exertion.  + history of atrial fibrillation or irregular heartbeat  Vascular: No history of rest pain in feet.  No history of claudication.  No history of non-healing ulcer, No history of DVT   Pulmonary: No home oxygen, no productive cough, no hemoptysis,  No asthma or wheezing  Musculoskeletal:  [X]  Arthritis, [ ]  Low back pain,  [X]  Joint pain  Hematologic:No history of hypercoagulable state.  No history of easy bleeding.  No history of anemia  Gastrointestinal: No hematochezia or melena,  No gastroesophageal reflux, no trouble swallowing  Urinary: [ ]  chronic Kidney disease, [ ]  on HD - [ ]  MWF or [ ]  TTHS, [ ]  Burning with urination, [ ]  Frequent urination, [ ]  Difficulty urinating;   Skin: No rashes  Psychological: No history of anxiety,  No history of depression   Physical Examination  Vitals:   06/20/17 0545 06/20/17 0855 06/20/17 1045 06/20/17 1245  BP: (!) 114/54 129/62 126/66 129/79  Pulse: 77  66   Resp: 18 18 16 16   Temp: 98.7 F (37.1 C) 98.3 F (36.8 C) 98.2 F (36.8 C)   TempSrc: Temporal Oral Oral   SpO2: 94% 96% 96% 96%  Weight:      Height:        Body mass index is 29.26 kg/m.  General:  Alert and oriented, no acute distress HEENT: Normal with exception of severe hearing deficit Neck: No JVD Pulmonary:  Clear to auscultation bilaterally Cardiac: Regular Rate and Rhythm Abdomen: Soft, non-tender, non-distended Skin: No rash Extremity Pulses:  2+ radial, brachial pulses bilaterally Musculoskeletal: No deformity or edema  Neurologic: Upper and lower extremity motor 5/5 and symmetric, no facial asymmetry, no dysarthria, severe hearing deficit  DATA:  CTA neck 70% ICA stenosis right with irregular calcified plaque, relatively high tortuous lesion up to C2 level, Similar lesion left side irregular calcified plaque 70% stenosis  ASSESSMENT:  TIA most likely symptomatic carotid stenosis.  Pt is poor historian.  Usually right handed people control language with left brain.  She denies any left body symptoms to me but in discussion with Dr Erlinda Hong he states that pt and son describe left side transient numbness in arm   PLAN:  I offered pt right CEA for stroke prophylaxis.  She is currently not interested in operation due to her age.  I informed her risk of stroke with operation is 1-2 percent and without 5-10 percent.  I also told her to discuss this with her son.    If she decides to proceed with CEA we would need to stop her Eliquis for a few days prior to operation.  She is fairly high risk with recent TIA and afib so will probably need heparin or lovenox bridging if she proceeds with wanting operation.  I will speak with her again tomorrow regarding this.  I have also ordered carotid duplex to further define lesion as well as location of bifurcation for  operative planning purposes.  Jaclyn Hinds, MD [facility] Office: [phone] Pager: [phone]

## 2017-06-20 NOTE — Progress Notes (Signed)
CAlled in by PT d/t t having new word finding problems. Arrived in room . Pt is alert and able to answer orientation questions but otherwise pt cant form full sentences. Dr Renne Crigler called into room for assessment Radford Pax Farley Ly RN

## 2017-06-20 NOTE — Progress Notes (Signed)
OT Cancellation Note  Patient Details Name: Rebekah Zackery MRN: [id number] DOB: Jun 19, 1935   Cancelled Treatment:    Reason Eval/Treat Not Completed: Patient at procedure or test/ unavailable(MRI). Pt with increased word finding difficulty at end of PT session, Pt currently at MRI, OT will continue to follow for eval.  Merri Ray Lindsay Soulliere 06/20/2017, 9:47 AM  Hulda Humphrey OTR/L [phone]

## 2017-06-20 NOTE — Progress Notes (Signed)
Patient seen and examined this morning, admitted overnight by Dr. Glyn Ade, H&P reviewed and agree with assessment and plan  This is a 82 year old female with history of A. fib on chronic anticoagulation with Eliquis, prior TIA, right ICA stenosis, hypertension, diabetes mellitus who was admitted to the hospital for the experiencing slurred speech while at a restaurant around 9:30 PM on 5/3.  She was brought directly to the ED.  She was also complaining of left upper side extremity for most of the same day.  Her slurred speech has resolved, she was admitted to the hospital for further work-up   TIA -Neurology consulted, appreciate input.  She underwent an MRI on 5/4 which was negative for acute or subacute infarct, it did show stable atrophy and white matter disease, moderately advanced for age.  On 5/4 in the morning, after working with physical therapy patient has experienced recurrent expressive aphasia. -She also underwent a CT angiogram of the head and neck which showed right proximal ICA severe greater than 70% stenosis due to calcified plaque, left proximal ICA 50-70% stenosis with calcified plaque.  In addition, there are multiple other segments of mild to moderate stenosis. -She is already on Eliquis, continue -Lipid panel shows an LDL of 79.  She is allergic to statins -Hemoglobin A1c 8.8  Poorly controlled diabetes mellitus -Continue Lantus as well as sliding scale  Paroxysmal atrial fibrillation -Currently sinus rhythm, CHADS > 2, on Eliquis  Hypertension -Allow for permissive hypertension  Hypothyroidism -Continue Synthroid  History of recurrent UTIs -Used to be on trimethoprim which patient is not taking   Tejon Gracie M. Cruzita Lederer, MD [facility] [phone]  If 7PM-7AM, please contact night-coverage [email or web address] Password [email or web address]

## 2017-06-20 NOTE — Care Management Note (Signed)
Case Management Note  Patient Details  Name: Jaclyn [name] MRN: [id number] Date of Birth: [birth year]  Subjective/Objective:     Pt presented with speech and left side deficits and diagnosed with CVA.  Pt from home with son.  Pt's son works Mon-Fri 8-5 and is home with patient otherwise.  Pt has a RW at home but [place] does not use it.  No other DME at home.    Pt has used HH PT in the past.  She does not remember the agency and does not want to pursue [facility] PT again.  She states she would not use the same agency again either.  Pt also states that her prescription costs exceed $1000/month and she cannot afford them.  Pt states she has prescription drug coverage that helps cover cost but still cannot afford.  Pt states she has access to a patient assistance program but has not pursued.  She uses a [facility] and a Product/process development scientist but was unwilling to tell me which ones.  I offered to call pharmacies to discuss prescription coverage but she did not want me to do this at this time.  She stated she did not want to tell me "the whole story".           Pt had strange affect and awareness during our conversation.  She needed reminders to not walk away from IV pump and seemed to be looking for something when I entered room, but stated she was not.  She was oriented x 4 and knew the names of her medications.  Action/Plan: CM will continue to follow for d/c needs.   Expected Discharge Date:                  Expected Discharge Plan:  [place]  In-House Referral:  NA  Discharge planning Services  CM Consult  Post Acute Care Choice:    Choice offered to:     DME Arranged:    DME Agency:     HH Arranged:    HH Agency:     Status of Service:     If discussed at [employer], dates discussed:    Additional Comments:  Claudie Leach, RN 06/20/2017, 3:30 PM

## 2017-06-20 NOTE — Care Management Obs Status (Signed)
[place] NOTIFICATION   Patient Details  Name: Jaclyn [name] MRN: [id number] Date of Birth: [birth year]   Medicare Observation Status Notification Given:  Yes    Claudie Leach, RN 06/20/2017, 3:29 PM

## 2017-06-21 ENCOUNTER — Observation Stay (HOSPITAL_BASED_OUTPATIENT_CLINIC_OR_DEPARTMENT_OTHER): Payer: Medicare Other

## 2017-06-21 DIAGNOSIS — I480 Paroxysmal atrial fibrillation: Secondary | ICD-10-CM | POA: Diagnosis not present

## 2017-06-21 DIAGNOSIS — M6281 Muscle weakness (generalized): Secondary | ICD-10-CM | POA: Diagnosis not present

## 2017-06-21 DIAGNOSIS — Z79899 Other long term (current) drug therapy: Secondary | ICD-10-CM | POA: Diagnosis not present

## 2017-06-21 DIAGNOSIS — Z794 Long term (current) use of insulin: Secondary | ICD-10-CM | POA: Diagnosis not present

## 2017-06-21 DIAGNOSIS — E039 Hypothyroidism, unspecified: Secondary | ICD-10-CM | POA: Diagnosis not present

## 2017-06-21 DIAGNOSIS — R2681 Unsteadiness on feet: Secondary | ICD-10-CM | POA: Diagnosis not present

## 2017-06-21 DIAGNOSIS — I340 Nonrheumatic mitral (valve) insufficiency: Secondary | ICD-10-CM | POA: Diagnosis not present

## 2017-06-21 DIAGNOSIS — R4781 Slurred speech: Secondary | ICD-10-CM | POA: Diagnosis not present

## 2017-06-21 DIAGNOSIS — Z8673 Personal history of transient ischemic attack (TIA), and cerebral infarction without residual deficits: Secondary | ICD-10-CM | POA: Diagnosis not present

## 2017-06-21 DIAGNOSIS — I6521 Occlusion and stenosis of right carotid artery: Secondary | ICD-10-CM | POA: Diagnosis not present

## 2017-06-21 DIAGNOSIS — I4891 Unspecified atrial fibrillation: Secondary | ICD-10-CM | POA: Diagnosis not present

## 2017-06-21 DIAGNOSIS — E1159 Type 2 diabetes mellitus with other circulatory complications: Secondary | ICD-10-CM | POA: Diagnosis not present

## 2017-06-21 DIAGNOSIS — Z91040 Latex allergy status: Secondary | ICD-10-CM | POA: Diagnosis not present

## 2017-06-21 DIAGNOSIS — I63231 Cerebral infarction due to unspecified occlusion or stenosis of right carotid arteries: Secondary | ICD-10-CM | POA: Diagnosis not present

## 2017-06-21 DIAGNOSIS — E119 Type 2 diabetes mellitus without complications: Secondary | ICD-10-CM | POA: Diagnosis not present

## 2017-06-21 DIAGNOSIS — I639 Cerebral infarction, unspecified: Secondary | ICD-10-CM | POA: Diagnosis not present

## 2017-06-21 DIAGNOSIS — I10 Essential (primary) hypertension: Secondary | ICD-10-CM | POA: Diagnosis not present

## 2017-06-21 DIAGNOSIS — Z7901 Long term (current) use of anticoagulants: Secondary | ICD-10-CM | POA: Diagnosis not present

## 2017-06-21 DIAGNOSIS — Z87440 Personal history of urinary (tract) infections: Secondary | ICD-10-CM | POA: Diagnosis not present

## 2017-06-21 LAB — ECHOCARDIOGRAM COMPLETE
HEIGHTINCHES: 62 in
WEIGHTICAEL: 2560 [oz_av]

## 2017-06-21 LAB — GLUCOSE, CAPILLARY
Glucose-Capillary: 169 mg/dL — ABNORMAL HIGH (ref 65–99)
Glucose-Capillary: 181 mg/dL — ABNORMAL HIGH (ref 65–99)

## 2017-06-21 MED ORDER — ASPIRIN 81 MG PO TBEC: 81.0000 mg | DELAYED_RELEASE_TABLET | Freq: Every day | ORAL | 1 refills | Status: DC

## 2017-06-21 MED ORDER — CIPROFLOXACIN HCL 500 MG PO TABS: 500.0000 mg | ORAL_TABLET | Freq: Two times a day (BID) | ORAL | 0 refills | Status: AC

## 2017-06-21 NOTE — Care Management (Addendum)
Spoke with patient again regarding [place] and DME.  Pt now states she would be willing to participate in [facility] but does not want to use [facility] again.  Pt offered choice and agreed to [facility].  Alwyn Ren with [facility] contacted and accepted referral.  [place] planned for Wednesday 06/24/17.  Pt states she would like to have a 3n1 to use as a shower chair and a cane. She does not want to wait for delivery and states she will pick up at [facility] store.  Orders and facesheet printed for patient to take to [facility] store.  Offered again to contact pharmacy to clarify prescription drug costs.  Patient states she has an assistance program application underway and she does not needs assistance at this time.  Advised patient to call MD office and ask for samples of Eliquis if cannot afford.  Also, prescribing MD may be able to enroll patient in an assistance program.

## 2017-06-21 NOTE — Progress Notes (Signed)
Occupational Therapy Evaluation Patient Details Name: Jaclyn [name] MRN: [id number] DOB: 18-Aug-1935 Today's Date: 06/21/2017    History of Present Illness Patient is a 82 y.o. F with signficiant PMH of atrial fibrillation on Eliquis, right ICA stenosis, diabetes, hyperlipidemia, fibromyalgia, headaches, multiple TIAs in the past involving the left side of her body, presenting with slurred speech and LUE numbness.    Clinical Impression   PTA, pt lived with son and was modified independent with ADL and mobility. Pt no longer drives. Pt with apparent cognitive deficits and complains of "blurry vision", although states her vision is improving. Recommend HHOT and follow up with her eye doctor as it has been over 2 years since her last eye exam.  Son asking about how his mom can get financial assistance with her medication.     Follow Up Recommendations  Home health OT;Supervision/Assistance - 24 hour(initially )    Equipment Recommendations  3 in 1 bedside commode(as shower chair)    Recommendations for Other Services  SW/CM - information for any financial medication management assistance     Precautions / Restrictions Precautions Precautions: Fall Restrictions Weight Bearing Restrictions: No      Mobility Bed Mobility Overal bed mobility: Independent                Transfers Overall transfer level: Needs assistance Equipment used: None Transfers: Sit to/from Stand Sit to Stand: Supervision         General transfer comment: Supervision for safety from bed and toilet    Balance Overall balance assessment: Needs assistance Sitting-balance support: No upper extremity supported;Feet unsupported Sitting balance-Leahy Scale: Fair Sitting balance - Comments: Patient with one instance of lateral lean with manual muscle testing     Standing balance-Leahy Scale: Fair Standing balance comment: Able to stand statically independently with grooming at sink but needs  additional assistance with dynamic balance                           ADL either performed or assessed with clinical judgement   ADL Overall ADL's : Needs assistance/impaired                                     Functional mobility during ADLs: Supervision/safety General ADL Comments: overall S for ADL; recommend use of 3in1 as shower chair     Vision Baseline Vision/History: Wears glasses Patient Visual Report: Blurring of vision Vision Assessment?: Vision impaired- to be further tested in functional context Additional Comments: recommend follow up with eye doctor     Perception     Praxis      Pertinent Vitals/Pain Pain Assessment: Faces Faces Pain Scale: Hurts a little bit Pain Location: all over wtih fibromyalgia Pain Descriptors / Indicators: Discomfort Pain Intervention(s): Limited activity within patient's tolerance     Hand Dominance Right   Extremity/Trunk Assessment Upper Extremity Assessment Upper Extremity Assessment: Overall WFL for tasks assessed LUE Deficits / Details: WFL   Lower Extremity Assessment Lower Extremity Assessment: Defer to PT evaluation(my legs "give out on me")   Cervical / Trunk Assessment Cervical / Trunk Assessment: Normal   Communication Communication Communication: Expressive difficulties;HOH(difficulty word finding (see clinical impression))   Cognition Arousal/Alertness: Awake/alert Behavior During Therapy: WFL for tasks assessed/performed Overall Cognitive Status: Impaired/Different from baseline Area of Impairment: Attention;Orientation;Memory;Safety/judgement  Orientation Level: Disoriented to;Place Current Attention Level: Sustained Memory: Decreased short-term memory   Safety/Judgement: Decreased awareness of safety     General Comments: Son reports pt is not at baseline but "close to it". Staets "she gets like this when she has a UTI"   General Comments        Exercises     Shoulder Instructions      Home Living Family/patient expects to be discharged to:: Private residence Living Arrangements: Children Available Help at Discharge: Family;Available PRN/intermittently Type of Home: House Home Access: Level entry     Home Layout: One level     Bathroom Shower/Tub: Teacher, early years/pre: Standard Bathroom Accessibility: Yes How Accessible: Accessible via walker Home Equipment: Walker - 2 wheels          Prior Functioning/Environment Level of Independence: Independent with assistive device(s)        Comments: Uses RW intermittently for balance. Independent with ADL's. Mainly household ambulator and reports no falls in past month.        OT Problem List: Decreased activity tolerance;Decreased safety awareness;Decreased knowledge of use of DME or AE;Obesity;Pain      OT Treatment/Interventions:      OT Goals(Current goals can be found in the care plan section) Acute Rehab OT Goals Patient Stated Goal: Go for more walks OT Goal Formulation: All assessment and education complete, DC therapy  OT Frequency:     Barriers to D/C:            Co-evaluation              AM-PAC PT "6 Clicks" Daily Activity     Outcome Measure Help from another person eating meals?: None Help from another person taking care of personal grooming?: None Help from another person toileting, which includes using toliet, bedpan, or urinal?: None Help from another person bathing (including washing, rinsing, drying)?: A Little Help from another person to put on and taking off regular upper body clothing?: None Help from another person to put on and taking off regular lower body clothing?: A Little 6 Click Score: 22   End of Session Equipment Utilized During Treatment: Gait belt Nurse Communication: Mobility status  Activity Tolerance: Patient tolerated treatment well Patient left: in bed;with call bell/phone within reach;with  family/visitor present  OT Visit Diagnosis: Unsteadiness on feet (R26.81);Muscle weakness (generalized) (M62.81);Other symptoms and signs involving cognitive function;Pain Pain - part of body: (general)                Time: 9528-4132 OT Time Calculation (min): 25 min Charges:  OT General Charges $OT Visit: 1 Visit OT Evaluation $OT Eval Low Complexity: 1 Low OT Treatments $Self Care/Home Management : 8-22 mins G-Codes:     [facility], OT/L  [phone] 06/21/2017  Aisha Greenberger,HILLARY 06/21/2017, 3:01 PM

## 2017-06-21 NOTE — Progress Notes (Addendum)
Neurology Progress Note  Reason for Consult: Code stroke Referring Physician: Slurred speech, left-sided weakness  CC: Slurred speech  HPI: Meranda Dechaine is a 82 y.o. female was a past medical history of atrial fibrillation currently on Eliquis, known right ICA stenosis. She presents with multiple TIAs in the past involving the left side of the body as well as speech changes. On 5/3, she started noticing that her speech was very slurred.  She reports having felt like her speech sounded like a person who has had alcohol intoxication. Upon initial exam, there was some left side numbness as well.    Interval History: 5/4: Return of symptoms when she got up to work w/rehab team. she suddenly began to have very slurred speech again. BP was 122/60. She was laid flat, IVF started and MRI changed to stat. 5/5: No return of symptoms at this time today, but has been min up.   ROS: ROS was performed and is negative except as noted in the HPI.   Medications  Current Facility-Administered Medications:  .  0.9 %  sodium chloride infusion, , Intravenous, Continuous, Gherghe, Costin M, MD, Last Rate: 100 mL/hr at 06/21/17 1047 .  0.9 %  sodium chloride infusion, , Intravenous, Continuous, Metzger-Cihelka, Desiree, NP, Stopped at 06/20/17 0930 .  acetaminophen (TYLENOL) tablet 650 mg, 650 mg, Oral, Q4H PRN **OR** acetaminophen (TYLENOL) solution 650 mg, 650 mg, Per Tube, Q4H PRN **OR** acetaminophen (TYLENOL) suppository 650 mg, 650 mg, Rectal, Q4H PRN, Rise Patience, MD .  apixaban Arne [place]) tablet 5 mg, 5 mg, Oral, BID, Metzger-Cihelka, Desiree, NP, 5 mg at 06/20/17 2123 .  aspirin EC tablet 81 mg, 81 mg, Oral, Daily, Metzger-Cihelka, Desiree, NP .  DULoxetine (CYMBALTA) DR capsule 60 mg, 60 mg, Oral, Daily, Rise Patience, MD, 60 mg at 06/20/17 0754 .  ezetimibe (ZETIA) tablet 10 mg, 10 mg, Oral, Daily, Rise Patience, MD, 10 mg at 06/20/17 0753 .  hydrALAZINE (APRESOLINE) injection  10 mg, 10 mg, Intravenous, Q4H PRN, Rise Patience, MD .  insulin aspart (novoLOG) injection 0-9 Units, 0-9 Units, Subcutaneous, TID WC, Rise Patience, MD, 2 Units at 06/21/17 [phone] .  insulin glargine (LANTUS) injection 85 Units, 85 Units, Subcutaneous, QHS, Rise Patience, MD, 85 Units at 06/20/17 2122 .  levothyroxine (SYNTHROID, LEVOTHROID) tablet 112 mcg, 112 mcg, Oral, QAC breakfast, Rise Patience, MD, 112 mcg at 06/21/17 0254  Exam: Current vital signs: BP (!) 152/70 (BP Location: Right Arm)   Pulse 77   Temp 98.2 F (36.8 C) (Oral)   Resp 20   Ht 5\' 2"  (1.575 m)   Wt 72.6 kg (160 lb)   SpO2 98%   BMI 29.26 kg/m  Vital signs in last 24 hours: Temp:  [97.5 F (36.4 C)-99 F (37.2 C)] 98.2 F (36.8 C) (05/05 0349) Pulse Rate:  [68-78] 77 (05/05 0349) Resp:  [16-20] 20 (05/05 0349) BP: (124-155)/(63-86) 152/70 (05/05 0349) SpO2:  [96 %-98 %] 98 % (05/05 0349) General: Awake alert no apparent distress H ENT: Normal cephalic atraumatic dry mucous membranes, poor dentition especially in the mandibular teeth.  Right carotid bruit positive. Very HOH CVS: S1-S2 regular rate rhythm Respirate: Chest clear to auscultation Extremities: Warm well perfused Neurological exam Patient is awake, alert, oriented x3.  Mildly reduced attention concentration.  Perseverates at times. She is extremely hard of hearing but is able to name, repeat and comprehend normally. Her speech is mod dysarthric at this time. Cranial nerves: Pupils equal  round reactive to light, extraocular movements intact, visual fields full, no facial asymmetry, facial sensation intact, auditory acuity moderately reduced bilaterally, palate elevates symmetrically, shoulder shrug intact, tongue midline. Motor exam: 5/5 in all 4 extremities with normal range of motion Sensory exam: Intact to light touch on the right but mildly decreased sensation on the left arm compared to the right leg  curretly. Coordination: Intact finger-nose-finger Gait, slightly ataxic-did not formally test gait but observed while she got out of the chair and got onto the CT scanner table. NIH stroke scale-2 (1 for dysarthria, 1 for sensory)  Labs I have reviewed labs in epic and the results pertinent to this consultation are:  CBC    Component Value Date/Time   WBC 8.1 06/19/2017 2247   RBC 4.17 06/19/2017 2247   HGB 12.9 06/19/2017 2254   HCT 38.0 06/19/2017 2254   PLT 261 06/19/2017 2247   MCV 90.9 06/19/2017 2247   MCH 30.5 06/19/2017 2247   MCHC 33.5 06/19/2017 2247   RDW 13.9 06/19/2017 2247   LYMPHSABS 2.3 06/19/2017 2247   MONOABS 0.6 06/19/2017 2247   EOSABS 0.2 06/19/2017 2247   BASOSABS 0.0 06/19/2017 2247    CMP     Component Value Date/Time   NA 141 06/19/2017 2254   K 3.9 06/19/2017 2254   CL 103 06/19/2017 2254   CO2 24 06/19/2017 2247   GLUCOSE 263 (H) 06/19/2017 2254   BUN 8 06/19/2017 2254   CREATININE 0.70 06/19/2017 2254   CREATININE 0.77 01/26/2015 1700   CALCIUM 9.0 06/19/2017 2247   PROT 7.0 06/19/2017 2247   ALBUMIN 3.7 06/19/2017 2247   AST 28 06/19/2017 2247   ALT 23 06/19/2017 2247   ALKPHOS 75 06/19/2017 2247   BILITOT 0.7 06/19/2017 2247   GFRNONAA >60 06/19/2017 2247   GFRAA >60 06/19/2017 2247  LDL 128 in March 2018   Imaging I have reviewed the images obtained thus far and also reviewed 2017 MRI for comparison:  CT-scan of the brain-no acute changes CT angiogram-no LVO but shows more than 70% right carotid stenosis, moderate stenosis of the left carotid as well. Posterior circulation shows no significant stenosis.   CT perfusion study does not reveal any perfusion deficits.  Assessment:  82 year old woman with TIAs. There was no LVO on CT angiogram and perfusion studies hence not a candidate for EVT. Stroke risks: A. fib on Eliquis, right ICA stenosis with multiple TIAs involving the left side of the body, diabetes, hyperlipidemia   #  Transient neurologic symptoms- She does have significant right ICA stenosis and her symptoms now for multiple times has been left-sided numbness or weakness along with dysarthria, making for high suspicion for a large vessel atherosclerotic thrombotic right hemispheric stroke/TIA at this time.  # Rt ICA stenosis- ASA started. Will consult vascular surgery once MRI is completed to evaluate stroke burden # DM2 w/hyperglycemia- A1C 8.8 (improved from >11 in March), SSI for tight glucose control # Afib- rate controlled. Continue on Eliquis as long as no bleed seen on MR # HTN- hold all BP meds at this time as she acutely worsens with drop in BP    Recommendations: -Lift Bed rest today, caution w/mobilization -Orthostatic BP to be done w/rehab today, and Qshift -Continue NS @75ml /hr -Avoid any drop in BP; permissive hypertension SBP >220 mmHg -Echocardiogram done this am, results pending -Continue telemetry monitoring & Frequent neuro checks -Hold Coreg for now -Changed ASA 81mg  daily  -Eliquis BID -Would recommend to be on  statin but she has adverse reactions to statin.  Renew Zetia. -Risk factor modification -Rehab to re-eval today, with caution and orthostatic bps -Appreciate Vascular consult for CEA for the symptomatic RICA stenosis. Pt has been offered surgical intervention. She tells me she had an Uncle die after this procedure (~20yrs ago) thus her apprehension to have this done. I reassured her that this procedure has been much improved on over last 53yrs.   Please page stroke NP/PA/MD (listed on AMION)  from 8am-4 pm as this patient will be followed by the stroke team at this point.  Desiree Metzger-Cihelka, ARNP-C [facility]  ATTENDING NOTE: I reviewed above note and agree with the assessment and plan. I have made any additions or clarifications directly to the above note. Pt was seen and examined.   Pt son at bedside. No event overnight. She was seen on the hallway  working with PT, tolerating well. No adverse effect today. VVS Dr. Oneida Alar saw pt and discussed with son and pt about right CEA procedure, however, they have not decided yet. Dr. Oneida Alar will follow up on that as outpt. Continue eliquis and ASA and zetia. Prior CEA, recommend to avoid hypotension, BP goal 130-150. Post procedure, BP goal normotensive.   Neurology will sign off. Please call with questions. Pt will follow up with Dr. Posey Pronto in [facility] in about 4 weeks. Thanks for the consult.   Rosalin Hawking, MD PhD Stroke Neurology 06/21/2017 12:37 PM

## 2017-06-21 NOTE — Progress Notes (Signed)
  Echocardiogram 2D Echocardiogram has been performed.  Jennette Dubin 06/21/2017, 10:30 AM

## 2017-06-21 NOTE — Discharge Summary (Signed)
Physician Discharge Summary  Jaclyn [name] ESP:[id number] DOB: 1935/12/12 DOA: 06/19/2017  PCP: Jinny Sanders, MD  Admit date: 06/19/2017 Discharge date: 06/21/2017  Admitted From: home Disposition:  home  Recommendations for Outpatient Follow-up:  1. Follow up with PCP in 1-2 weeks 2. Patient missed endocrinology appointment last year with Dr. Cruzita Lederer, would like to establish again, follow up as soon as possible 3. Follow up with Dr. Oneida Alar in 1 week 4. Follow up with Neurology in 1-2 months   Home Health: PT, OT Equipment/Devices: none  Discharge Condition: stable CODE STATUS: Full code Diet recommendation: heart healthy, diabetic   HPI: Per Dr. Glyn Ade, Jaclyn [name] is a 82 y.o. female with history of atrial fibrillation, TIA, right ICA stenosis, hypertension, diabetes mellitus presents to the ER after patient started experiencing slurred speech while at a restaurant at around 9:30 PM.  Patient's son was with the patient.  Patient felt like she was speaking like a drunk.  Patient also had some left-sided upper extremity numbness which patient states she has had since morning.  Denies any weakness of the upper or lower extremities.  Slurred speech resolved by half hour. ED Course: In the ER patient was appearing nonfocal.  CT head followed by CT angiogram of the head and neck and perfusion study showed right ICA stenosis.  Patient admitted for further stroke versus TIA work-up.  Hospital Course: TIA -patient was admitted to the hospital for concern for CVA.  Neurology consulted and followed patient while hospitalized. She underwent an MRI on 5/4 which was negative for acute or subacute infarct, it did show stable atrophy and white matter disease, moderately advanced for age. She also underwent a CT angiogram of the head and neck which showed right proximal ICA severe greater than 70% stenosis due to calcified plaque, left proximal ICA 50-70% stenosis with calcified plaque.  In  addition, there are multiple other segments of mild to moderate stenosis.  Vascular surgery was consulted, she was offered to have carotid endarterectomy however patient currently declined and will think about this and follow-up as an outpatient.  Given extensive vascular disease she was placed on aspirin in addition to her Eliquis.  Her lipid panel showed an LDL of 79.  She is allergic to statins.  Hemoglobin A1c was 8.8.  Of note,on 5/4 in the morning, after working with physical therapy patient has experienced recurrent expressive aphasia suggesting potentially blood pressure mediated decreased flow, and also her blood pressure medications have been discontinued on discharge and her goal blood pressure should be a little bit higher now.  2D echo showed normal EF 60 to 65%. Poorly controlled diabetes mellitus -continue home regimen, patient missed an appointment with Dr. Cruzita Lederer last year, however in my discussion with her she was unaware that she had an appointment.  She will call on the next work day and try to set up an appointment Paroxysmal atrial fibrillation -Currently sinus rhythm, CHADS > 2, on Eliquis Hypertension -Allow for permissive hypertension, her blood pressure medication has been discontinued on discharge Hypothyroidism -Continue Synthroid History of recurrent UTIs -Used to be on trimethoprim which patient is not taking, however has been having increased frequency and burning.  UA does suggest slight UTI, given symptoms we will go ahead and treat for 5 days of ciprofloxacin  Discharge Diagnoses:  Principal Problem:   Acute CVA (cerebrovascular accident) [facility] Active Problems:   Hypothyroidism   Essential hypertension, benign   PAROXYSMAL ATRIAL FIBRILLATION   Intracranial vascular stenosis  Type 2 diabetes mellitus with vascular disease ([place])   Slurred speech   Stenosis of right carotid artery     Discharge Instructions  Discharge Instructions    Ambulatory referral to  Neurology   Complete by:  As directed    Follow up with Dr. Posey Pronto in 4-6 weeks. Thanks.     Allergies as of 06/21/2017      Reactions   Naproxen Sodium Other (See Comments)   Fever/aches and pains   Statins    myalgias   Sulfonamide Derivatives Hives, Itching   Latex Itching, Rash   Shellfish Allergy Itching, Swelling, Rash   Seafood, shrimp      Medication List    STOP taking these medications   carvedilol 6.25 MG tablet Commonly known as:  COREG     TAKE these medications   apixaban 5 MG Tabs tablet Commonly known as:  ELIQUIS Take 1 tablet (5 mg total) by mouth 2 (two) times daily.   aspirin 81 MG EC tablet Take 1 tablet (81 mg total) by mouth daily.   ciprofloxacin 500 MG tablet Commonly known as:  CIPRO Take 1 tablet (500 mg total) by mouth 2 (two) times daily for 5 days.   DULoxetine 30 MG capsule Commonly known as:  CYMBALTA TAKE 2 CAPSULES BY MOUTH ONCE DAILY   ezetimibe 10 MG tablet Commonly known as:  ZETIA TAKE ONE TABLET BY MOUTH ONCE DAILY   glipiZIDE 10 MG tablet Commonly known as:  GLUCOTROL TAKE 1 TABLET BY MOUTH ONCE DAILY BEFORE BREAKFAST   Insulin Glargine 100 UNIT/ML Solostar Pen Commonly known as:  LANTUS SOLOSTAR Inject 85 Units at bedtime into the skin.   Insulin Pen Needle 30G X 8 MM Misc Commonly known as:  NOVOFINE Use to inject insulin once daily.  Dx: E11.65   levothyroxine 112 MCG tablet Commonly known as:  SYNTHROID, LEVOTHROID TAKE 1 TABLET BY MOUTH ONCE DAILY   metFORMIN 500 MG 24 hr tablet Commonly known as:  GLUCOPHAGE-XR TAKE 3 TABLETS BY MOUTH ONCE DAILY WITH BREAKFAST What changed:  See the new instructions.   trimethoprim 100 MG tablet Commonly known as:  TRIMPEX TAKE ONE TABLET BY MOUTH ONCE DAILY FOR UTI PROPHYLAXSIS      Follow-up Information    Elam Dutch, MD. Schedule an appointment as soon as possible for a visit in 1 week(s).   Specialties:  Vascular Surgery, Cardiology Contact  information: [place] [address] [phone]        Jinny Sanders, MD. Schedule an appointment as soon as possible for a visit in 3 week(s).   Specialty:  Family Medicine Contact information: [place] [address] [place], [place], DO. Schedule an appointment as soon as possible for a visit in 4 week(s).   Specialty:  Neurology Contact information: [place] [address] [phone]        Philemon Kingdom, MD. Schedule an appointment as soon as possible for a visit.   Specialty:  Internal Medicine Why:  as soon as there is an availability Contact information: [address] [place] [address] [phone]           Consultations:  Neurology   Vascular surgery   Procedures/Studies:  2D echo Study Conclusions - Left ventricle: The cavity size was normal. Wall thickness was normal. Systolic function was normal. The estimated ejection fraction was in the  range of 60% to 65%. - Mitral valve: There was mild regurgitation.  Ct Angio Head W Or Wo Contrast  Result Date: 06/19/2017 CLINICAL DATA:  82 y/o  F; left-sided weakness and slurred speech. EXAM: CT ANGIOGRAPHY HEAD AND NECK CT PERFUSION BRAIN TECHNIQUE: Multidetector CT imaging of the head and neck was performed using the standard protocol during bolus administration of intravenous contrast. Multiplanar CT image reconstructions and MIPs were obtained to evaluate the vascular anatomy. Carotid stenosis measurements (when applicable) are obtained utilizing NASCET criteria, using the distal internal carotid diameter as the denominator. Multiphase CT imaging of the brain was performed following IV bolus contrast injection. Subsequent parametric perfusion maps were calculated using RAPID software. CONTRAST:  148mL ISOVUE-370 IOPAMIDOL (ISOVUE-370) INJECTION 76% COMPARISON:  06/19/2017 CT head. 07/12/2015 MRI  and MRA of the head. FINDINGS: CTA NECK FINDINGS Aortic arch: Standard branching. Imaged portion shows no evidence of aneurysm or dissection. No significant stenosis of the major arch vessel origins. Moderate calcific atherosclerosis. Right carotid system: Right proximal ICA severe greater than 70% stenosis due to dense calcified plaque (series 7, image 204). Accurate assessment of luminal stenosis is suboptimal due to dense calcification. No dissection, aneurysm, or occlusion. Left carotid system: Left proximal ICA moderate 50-70% stenosis with calcified plaque. No dissection, aneurysm, or occlusion. Vertebral arteries: Right dominant. No evidence of dissection, stenosis (50% or greater) or occlusion. Skeleton: Moderate cervical spondylosis with discogenic degenerative changes predominantly at the C5-C7 levels and right upper cervical facet arthrosis. No high-grade bony canal stenosis. Prominent disc bulge at the C4-5 level approximately moderate canal stenosis. Other neck: Negative. Upper chest: Scarring in the lung apices. Review of the MIP images confirms the above findings CTA HEAD FINDINGS Anterior circulation: Moderate 50% right horizontal petrous ICA segment of stenosis (series 7, image 134). Severe calcified plaque of carotid siphons with severe right and moderate left paraclinoid ICA stenosis. Bilateral M1 mild stenosis. No large vessel occlusion, aneurysm, or vascular malformation identified. Posterior circulation: Bilateral P1 mild stenosis. Bilateral P2 segments of mild-to-moderate stenosis. Left V1 segment of mild stenosis. No large vessel occlusion, aneurysm, or vascular malformation identified. Venous sinuses: As permitted by contrast timing, patent. Anatomic variants: Patent right posterior communicating artery. No anterior communicating artery and left posterior communicating artery identified, likely hypoplastic or absent. Review of the MIP images confirms the above findings CT Brain Perfusion  Findings: CBF (<30%) Volume: 82mL Perfusion (Tmax>6.0s) volume: 43mL Mismatch Volume: 75mL Infarction Location:Not identified. IMPRESSION: 1. Negative CT brain perfusion. 2. No large vessel occlusion, dissection, aneurysm, or vascular malformation. 3. Right proximal ICA severe greater than 70% stenosis due to dense calcified plaque. 4. Left proximal ICA moderate 50-70% stenosis with calcified plaque. 5. Right horizontal petrous ICA moderate 50% segment of stenosis. 6. Severe right and moderate left paraclinoid ICA stenosis with calcified plaque. 7. Patent bilateral vertebral arteries. No dissection, aneurysm, or significant stenosis. 8. Multiple segments of mild-to-moderate stenosis throughout the anterior and posterior intracranial circulations. These results were called by telephone at the time of interpretation on 06/19/2017 at 11:44 pm to Dr. Amie Portland , who verbally acknowledged these results. Electronically Signed   By: Kristine Garbe M.D.   On: 06/19/2017 23:51   Ct Angio Neck W Or Wo Contrast  Result Date: 06/19/2017 CLINICAL DATA:  82 y/o  F; left-sided weakness and slurred speech. EXAM: CT ANGIOGRAPHY HEAD AND NECK CT PERFUSION BRAIN TECHNIQUE: Multidetector CT imaging of the head and neck was performed using the standard protocol during bolus administration of  intravenous contrast. Multiplanar CT image reconstructions and MIPs were obtained to evaluate the vascular anatomy. Carotid stenosis measurements (when applicable) are obtained utilizing NASCET criteria, using the distal internal carotid diameter as the denominator. Multiphase CT imaging of the brain was performed following IV bolus contrast injection. Subsequent parametric perfusion maps were calculated using RAPID software. CONTRAST:  156mL ISOVUE-370 IOPAMIDOL (ISOVUE-370) INJECTION 76% COMPARISON:  06/19/2017 CT head. 07/12/2015 MRI and MRA of the head. FINDINGS: CTA NECK FINDINGS Aortic arch: Standard branching. Imaged portion shows  no evidence of aneurysm or dissection. No significant stenosis of the major arch vessel origins. Moderate calcific atherosclerosis. Right carotid system: Right proximal ICA severe greater than 70% stenosis due to dense calcified plaque (series 7, image 204). Accurate assessment of luminal stenosis is suboptimal due to dense calcification. No dissection, aneurysm, or occlusion. Left carotid system: Left proximal ICA moderate 50-70% stenosis with calcified plaque. No dissection, aneurysm, or occlusion. Vertebral arteries: Right dominant. No evidence of dissection, stenosis (50% or greater) or occlusion. Skeleton: Moderate cervical spondylosis with discogenic degenerative changes predominantly at the C5-C7 levels and right upper cervical facet arthrosis. No high-grade bony canal stenosis. Prominent disc bulge at the C4-5 level approximately moderate canal stenosis. Other neck: Negative. Upper chest: Scarring in the lung apices. Review of the MIP images confirms the above findings CTA HEAD FINDINGS Anterior circulation: Moderate 50% right horizontal petrous ICA segment of stenosis (series 7, image 134). Severe calcified plaque of carotid siphons with severe right and moderate left paraclinoid ICA stenosis. Bilateral M1 mild stenosis. No large vessel occlusion, aneurysm, or vascular malformation identified. Posterior circulation: Bilateral P1 mild stenosis. Bilateral P2 segments of mild-to-moderate stenosis. Left V1 segment of mild stenosis. No large vessel occlusion, aneurysm, or vascular malformation identified. Venous sinuses: As permitted by contrast timing, patent. Anatomic variants: Patent right posterior communicating artery. No anterior communicating artery and left posterior communicating artery identified, likely hypoplastic or absent. Review of the MIP images confirms the above findings CT Brain Perfusion Findings: CBF (<30%) Volume: 90mL Perfusion (Tmax>6.0s) volume: 37mL Mismatch Volume: 76mL Infarction  Location:Not identified. IMPRESSION: 1. Negative CT brain perfusion. 2. No large vessel occlusion, dissection, aneurysm, or vascular malformation. 3. Right proximal ICA severe greater than 70% stenosis due to dense calcified plaque. 4. Left proximal ICA moderate 50-70% stenosis with calcified plaque. 5. Right horizontal petrous ICA moderate 50% segment of stenosis. 6. Severe right and moderate left paraclinoid ICA stenosis with calcified plaque. 7. Patent bilateral vertebral arteries. No dissection, aneurysm, or significant stenosis. 8. Multiple segments of mild-to-moderate stenosis throughout the anterior and posterior intracranial circulations. These results were called by telephone at the time of interpretation on 06/19/2017 at 11:44 pm to Dr. Amie Portland , who verbally acknowledged these results. Electronically Signed   By: Kristine Garbe M.D.   On: 06/19/2017 23:51   Mr Brain Wo Contrast  Result Date: 06/20/2017 CLINICAL DATA:  Focal neuro deficit for greater than 6 hours. Stroke suspected. Left-sided weakness and slurred speech beginning yesterday. EXAM: MRI HEAD WITHOUT CONTRAST TECHNIQUE: Multiplanar, multiecho pulse sequences of the brain and surrounding structures were obtained without intravenous contrast. COMPARISON:  CT head and CTA head and neck 06/19/2017. MRI brain 07/12/2015 FINDINGS: Brain: Diffusion-weighted images demonstrate no acute or subacute infarction. No acute hemorrhage or mass lesion is present. The ventricles are proportionate to the degree of atrophy and stable compared to 2017. Moderate diffuse periventricular and subcortical confluent white matter changes are also similar the prior study. The basal ganglia are intact. White  matter changes extend into the brainstem. The cerebellum is normal. The internal auditory canals are within normal limits bilaterally. Vascular: Flow is present in the major intracranial arteries. Skull and upper cervical spine: Skull base is within  normal limits. The craniocervical junction is normal. The upper cervical spine is within normal limits. Sinuses/Orbits: Mild mucosal thickening is present in the ethmoid air cells and left greater than right maxillary sinus. No fluid levels are present. The remaining paranasal sinuses are clear. There is some fluid in the mastoid air cells bilaterally. Obstructing nasopharyngeal lesion is present. A left lens replacement is present. Globes and orbits are otherwise within normal limits. IMPRESSION: 1. No acute or subacute infarct. 2. Stable atrophy and white matter disease, moderately advanced for age. 3. Minimal sinus disease. Electronically Signed   By: San Morelle M.D.   On: 06/20/2017 10:40   Ct Cerebral Perfusion W Contrast  Result Date: 06/19/2017 CLINICAL DATA:  82 y/o  F; left-sided weakness and slurred speech. EXAM: CT ANGIOGRAPHY HEAD AND NECK CT PERFUSION BRAIN TECHNIQUE: Multidetector CT imaging of the head and neck was performed using the standard protocol during bolus administration of intravenous contrast. Multiplanar CT image reconstructions and MIPs were obtained to evaluate the vascular anatomy. Carotid stenosis measurements (when applicable) are obtained utilizing NASCET criteria, using the distal internal carotid diameter as the denominator. Multiphase CT imaging of the brain was performed following IV bolus contrast injection. Subsequent parametric perfusion maps were calculated using RAPID software. CONTRAST:  175mL ISOVUE-370 IOPAMIDOL (ISOVUE-370) INJECTION 76% COMPARISON:  06/19/2017 CT head. 07/12/2015 MRI and MRA of the head. FINDINGS: CTA NECK FINDINGS Aortic arch: Standard branching. Imaged portion shows no evidence of aneurysm or dissection. No significant stenosis of the major arch vessel origins. Moderate calcific atherosclerosis. Right carotid system: Right proximal ICA severe greater than 70% stenosis due to dense calcified plaque (series 7, image 204). Accurate  assessment of luminal stenosis is suboptimal due to dense calcification. No dissection, aneurysm, or occlusion. Left carotid system: Left proximal ICA moderate 50-70% stenosis with calcified plaque. No dissection, aneurysm, or occlusion. Vertebral arteries: Right dominant. No evidence of dissection, stenosis (50% or greater) or occlusion. Skeleton: Moderate cervical spondylosis with discogenic degenerative changes predominantly at the C5-C7 levels and right upper cervical facet arthrosis. No high-grade bony canal stenosis. Prominent disc bulge at the C4-5 level approximately moderate canal stenosis. Other neck: Negative. Upper chest: Scarring in the lung apices. Review of the MIP images confirms the above findings CTA HEAD FINDINGS Anterior circulation: Moderate 50% right horizontal petrous ICA segment of stenosis (series 7, image 134). Severe calcified plaque of carotid siphons with severe right and moderate left paraclinoid ICA stenosis. Bilateral M1 mild stenosis. No large vessel occlusion, aneurysm, or vascular malformation identified. Posterior circulation: Bilateral P1 mild stenosis. Bilateral P2 segments of mild-to-moderate stenosis. Left V1 segment of mild stenosis. No large vessel occlusion, aneurysm, or vascular malformation identified. Venous sinuses: As permitted by contrast timing, patent. Anatomic variants: Patent right posterior communicating artery. No anterior communicating artery and left posterior communicating artery identified, likely hypoplastic or absent. Review of the MIP images confirms the above findings CT Brain Perfusion Findings: CBF (<30%) Volume: 55mL Perfusion (Tmax>6.0s) volume: 87mL Mismatch Volume: 19mL Infarction Location:Not identified. IMPRESSION: 1. Negative CT brain perfusion. 2. No large vessel occlusion, dissection, aneurysm, or vascular malformation. 3. Right proximal ICA severe greater than 70% stenosis due to dense calcified plaque. 4. Left proximal ICA moderate 50-70%  stenosis with calcified plaque. 5. Right horizontal petrous ICA  moderate 50% segment of stenosis. 6. Severe right and moderate left paraclinoid ICA stenosis with calcified plaque. 7. Patent bilateral vertebral arteries. No dissection, aneurysm, or significant stenosis. 8. Multiple segments of mild-to-moderate stenosis throughout the anterior and posterior intracranial circulations. These results were called by telephone at the time of interpretation on 06/19/2017 at 11:44 pm to Dr. Amie Portland , who verbally acknowledged these results. Electronically Signed   By: Kristine Garbe M.D.   On: 06/19/2017 23:51   Ct Head Code Stroke Wo Contrast  Result Date: 06/19/2017 CLINICAL DATA:  Code stroke. 82 year old female with left side weakness and slurred speech last seen normal 2130 hours. EXAM: CT HEAD WITHOUT CONTRAST TECHNIQUE: Contiguous axial images were obtained from the base of the skull through the vertex without intravenous contrast. COMPARISON:  Brain MRI, intracranial MRA, and head CT without contrast 07/12/2015. FINDINGS: Brain: Stable cerebral volume since 2017 including mild ventricular prominence. Patchy and confluent bilateral cerebral white matter hypodensity appears stable in both hemispheres. No midline shift, ventriculomegaly, mass effect, evidence of mass lesion, intracranial hemorrhage or evidence of cortically based acute infarction. Chronic dystrophic calcifications in the basal ganglia. No cortical encephalomalacia identified. Vascular: Calcified atherosclerosis at the skull base. No suspicious intracranial vascular hyperdensity. Skull: No acute osseous abnormality identified. Sinuses/Orbits: Visualized paranasal sinuses and mastoids are stable and well pneumatized. Other: Orbits soft tissues are stable and within normal limits. No acute scalp soft tissue finding. ASPECTS ([place] Stroke Program Early CT Score) - Ganglionic level infarction (caudate, lentiform nuclei, internal capsule,  insula, M1-M3 cortex): 7 - Supraganglionic infarction (M4-M6 cortex): 3 Total score (0-10 with 10 being normal): 10 IMPRESSION: 1. Stable non contrast CT appearance of the brain since 2017. 2. ASPECTS is 10. 3. These results were communicated to Dr. Rory Percy at 11:00 pmon 5/3/2019by text page via the [facility] messaging system. Electronically Signed   By: Genevie Ann M.D.   On: 06/19/2017 23:00     Subjective: - no chest pain, shortness of breath, no abdominal pain, nausea or vomiting.   Discharge Exam: Vitals:   06/21/17 0349 06/21/17 1254  BP: (!) 152/70 (!) 179/78  Pulse: 77 86  Resp: 20 20  Temp: 98.2 F (36.8 C) 98.4 F (36.9 C)  SpO2: 98% 96%    General: Pt is alert, awake, not in acute distress Cardiovascular: RRR, S1/S2 +, no rubs, no gallops Respiratory: CTA bilaterally, no wheezing, no rhonchi Abdominal: Soft, NT, ND, bowel sounds + Extremities: no edema, no cyanosis    The results of significant diagnostics from this hospitalization (including imaging, microbiology, ancillary and laboratory) are listed below for reference.     Microbiology: No results found for this or any previous visit (from the past 240 hour(s)).   Labs: BNP (last 3 results) Recent Labs    07/29/16 1655  BNP 56.4   Basic Metabolic Panel: Recent Labs  Lab 06/19/17 2247 06/19/17 2254  NA 138 141  K 3.9 3.9  CL 102 103  CO2 24  --   GLUCOSE 271* 263*  BUN 9 8  CREATININE 0.86 0.70  CALCIUM 9.0  --    Liver Function Tests: Recent Labs  Lab 06/19/17 2247  AST 28  ALT 23  ALKPHOS 75  BILITOT 0.7  PROT 7.0  ALBUMIN 3.7   No results for input(s): LIPASE, AMYLASE in the last 168 hours. No results for input(s): AMMONIA in the last 168 hours. CBC: Recent Labs  Lab 06/19/17 2247 06/19/17 2254  WBC 8.1  --  NEUTROABS 5.0  --   HGB 12.7 12.9  HCT 37.9 38.0  MCV 90.9  --   PLT 261  --    Cardiac Enzymes: No results for input(s): CKTOTAL, CKMB, CKMBINDEX, TROPONINI in the last 168  hours. BNP: Invalid input(s): POCBNP CBG: Recent Labs  Lab 06/20/17 1137 06/20/17 1632 06/20/17 2113 06/21/17 0632 06/21/17 1201  GLUCAP 77 206* 317* 169* 181*   D-Dimer No results for input(s): DDIMER in the last 72 hours. Hgb A1c Recent Labs    06/20/17 0316  HGBA1C 8.8*   Lipid Profile Recent Labs    06/20/17 0317  CHOL 161  HDL 35*  LDLCALC 79  TRIG 237*  CHOLHDL 4.6   Thyroid function studies No results for input(s): TSH, T4TOTAL, T3FREE, THYROIDAB in the last 72 hours.  Invalid input(s): FREET3 Anemia work up No results for input(s): VITAMINB12, FOLATE, FERRITIN, TIBC, IRON, RETICCTPCT in the last 72 hours. Urinalysis    Component Value Date/Time   COLORURINE YELLOW 06/20/2017 0019   APPEARANCEUR HAZY (A) 06/20/2017 0019   LABSPEC 1.040 (H) 06/20/2017 0019   PHURINE 6.0 06/20/2017 0019   GLUCOSEU 150 (A) 06/20/2017 0019   GLUCOSEU NEGATIVE 10/08/2012 1148   HGBUR NEGATIVE 06/20/2017 0019   BILIRUBINUR NEGATIVE 06/20/2017 0019   BILIRUBINUR negative 05/09/2016 1534   KETONESUR NEGATIVE 06/20/2017 0019   PROTEINUR NEGATIVE 06/20/2017 0019   UROBILINOGEN 0.2 05/09/2016 1534   UROBILINOGEN 0.2 11/01/2014 2113   NITRITE POSITIVE (A) 06/20/2017 0019   LEUKOCYTESUR SMALL (A) 06/20/2017 0019   Sepsis Labs Invalid input(s): PROCALCITONIN,  WBC,  LACTICIDVEN   Time coordinating discharge: 35 minutes  SIGNED:  Marzetta Board, MD  [facility] 06/21/2017, 2:07 PM Pager [phone]  If 7PM-7AM, please contact night-coverage [email or web address] Password [email or web address]

## 2017-06-21 NOTE — Discharge Instructions (Addendum)
Follow with Jinny Sanders, MD in 2-3 weeks Follow up with Dr. Oneida Alar with Vascular surgery in 1 week  Please get a complete blood count and chemistry panel checked by your Primary MD at your next visit, and again as instructed by your Primary MD. Please get your medications reviewed and adjusted by your Primary MD.  Please request your Primary MD to go over all Hospital Tests and Procedure/Radiological results at the follow up, please get all Hospital records sent to your Prim MD by signing hospital release before you go home.  If you had Pneumonia of Lung problems at the Hospital: Please get a 2 view Chest X ray done in 6-8 weeks after hospital discharge or sooner if instructed by your Primary MD.  If you have Congestive Heart Failure: Please call your Cardiologist or Primary MD anytime you have any of the following symptoms:  1) 3 pound weight gain in 24 hours or 5 pounds in 1 week  2) shortness of breath, with or without a dry hacking cough  3) swelling in the hands, feet or stomach  4) if you have to sleep on extra pillows at night in order to breathe  Follow cardiac low salt diet and 1.5 lit/day fluid restriction.  If you have diabetes Accuchecks 4 times/day, Once in AM empty stomach and then before each meal. Log in all results and show them to your primary doctor at your next visit. If any glucose reading is under 80 or above 300 call your primary MD immediately.  If you have Seizure/Convulsions/Epilepsy: Please do not drive, operate heavy machinery, participate in activities at heights or participate in high speed sports until you have seen by Primary MD or a Neurologist and advised to do so again.  If you had Gastrointestinal Bleeding: Please ask your Primary MD to check a complete blood count within one week of discharge or at your next visit. Your endoscopic/colonoscopic biopsies that are pending at the time of discharge, will also need to followed by your Primary MD.  Get  Medicines reviewed and adjusted. Please take all your medications with you for your next visit with your Primary MD  Please request your Primary MD to go over all hospital tests and procedure/radiological results at the follow up, please ask your Primary MD to get all Hospital records sent to his/her office.  If you experience worsening of your admission symptoms, develop shortness of breath, life threatening emergency, suicidal or homicidal thoughts you must seek medical attention immediately by calling 911 or calling your MD immediately  if symptoms less severe.  You must read complete instructions/literature along with all the possible adverse reactions/side effects for all the Medicines you take and that have been prescribed to you. Take any new Medicines after you have completely understood and accpet all the possible adverse reactions/side effects.   Do not drive or operate heavy machinery when taking Pain medications.   Do not take more than prescribed Pain, Sleep and Anxiety Medications  Special Instructions: If you have smoked or chewed Tobacco  in the last 2 yrs please stop smoking, stop any regular Alcohol  and or any Recreational drug use.  Wear Seat belts while driving.  Please note You were cared for by a hospitalist during your hospital stay. If you have any questions about your discharge medications or the care you received while you were in the hospital after you are discharged, you can call the unit and asked to speak with the hospitalist on call  if the hospitalist that took care of you is not available. Once you are discharged, your primary care physician will handle any further medical issues. Please note that NO REFILLS for any discharge medications will be authorized once you are discharged, as it is imperative that you return to your primary care physician (or establish a relationship with a primary care physician if you do not have one) for your aftercare needs so that they  can reassess your need for medications and monitor your lab values.  You can reach the hospitalist office at phone [phone] or fax [phone]   If you do not have a primary care physician, you can call [phone] for a physician referral.  Activity: As tolerated with Full fall precautions use walker/cane & assistance as needed  Diet: heart healthy, diabetic  Disposition Home

## 2017-06-21 NOTE — Progress Notes (Signed)
Physical Therapy Treatment Patient Details Name: Jaclyn [name] MRN: [id number] DOB: 26-Apr-1935 Today's Date: 06/21/2017    History of Present Illness Patient is a 82 y.o. F with signficiant PMH of atrial fibrillation on Eliquis, right ICA stenosis, diabetes, hyperlipidemia, fibromyalgia, headaches, multiple TIAs in the past involving the left side of her body, presenting with slurred speech and LUE numbness.     PT Comments    Patient is progressing very well towards their physical therapy goals. Session focused on gait training with [place] versus RW. Ambulated 350 feet with min guard assistance using SPC. Has continued difficulty with dual tasking. Patient requiring significantly increased time to name 5 animals during ambulation. Recommended to patient and patient son that patient used RW at home for all mobility due to increased steadiness and gait speed compared to without an assistive device. Patient son verbalized understanding.     Follow Up Recommendations  Home health PT;Supervision for mobility/OOB     Equipment Recommendations  None recommended by PT;Other (comment)((try SPC versus RW))    Recommendations for Other Services       Precautions / Restrictions Precautions Precautions: Fall Restrictions Weight Bearing Restrictions: No    Mobility  Bed Mobility Overal bed mobility: Independent                Transfers Overall transfer level: Needs assistance Equipment used: None Transfers: Sit to/from Stand Sit to Stand: Supervision         General transfer comment: Supervision for safety from bed and toilet  Ambulation/Gait Ambulation/Gait assistance: Min guard  Distance: 350 feet Assistive device: Straight cane;Rolling walker (2 wheeled) Gait Pattern/deviations: Step-through pattern;Decreased step length - left;Shuffle;Decreased weight shift to left     General Gait Details: Patient with slow, shuffling steps using SPC. Gait training focused on dual task  with naming animals and desserts as well as head turns. Patient with noticeably decreased gait speed with distractions. [place] with RW.   Stairs             Wheelchair Mobility    Modified Rankin (Stroke Patients Only) Modified Rankin (Stroke Patients Only) Pre-Morbid Rankin Score: Slight disability Modified Rankin: Moderately severe disability     Balance Overall balance assessment: Needs assistance Sitting-balance support: No upper extremity supported;Feet unsupported Sitting balance-Leahy Scale: Fair Sitting balance - Comments: Patient with one instance of lateral lean with manual muscle testing     Standing balance-Leahy Scale: Fair Standing balance comment: Able to stand statically independently with grooming at sink but needs additional assistance with dynamic balance                            Cognition Arousal/Alertness: Awake/alert Behavior During Therapy: WFL for tasks assessed/performed Overall Cognitive Status: Impaired/Different from baseline Area of Impairment: Attention;Orientation                 Orientation Level: Disoriented to;Place Current Attention Level: Focused           General Comments: Strange affect. Patient thought PT was her granddaughter initially. During ambulation, asks if we were still in [facility].      Exercises      General Comments        Pertinent Vitals/Pain Pain Assessment: No/denies pain    Home Living                      Prior Function  PT Goals (current goals can now be found in the care plan section) Acute Rehab PT Goals Patient Stated Goal: Go for more walks PT Goal Formulation: With patient Time For Goal Achievement: 07/04/17 Potential to Achieve Goals: Good Progress towards PT goals: Progressing toward goals    Frequency    Min 4X/week      PT Plan Current plan remains appropriate    Co-evaluation              AM-PAC PT "6 Clicks" Daily Activity   Outcome Measure  Difficulty turning over in bed (including adjusting bedclothes, sheets and blankets)?: None Difficulty moving from lying on back to sitting on the side of the bed? : None Difficulty sitting down on and standing up from a chair with arms (e.g., wheelchair, bedside commode, etc,.)?: A Little Help needed moving to and from a bed to chair (including a wheelchair)?: A Little Help needed walking in hospital room?: A Little Help needed climbing 3-5 steps with a railing? : A Little 6 Click Score: 20    End of Session Equipment Utilized During Treatment: Gait belt Activity Tolerance: Patient tolerated treatment well Patient left: in bed;with call bell/phone within reach;with family/visitor present Nurse Communication: Mobility status PT Visit Diagnosis: Unsteadiness on feet (R26.81);Other abnormalities of gait and mobility (R26.89);Difficulty in walking, not elsewhere classified (R26.2)     Time: 1025-8527 PT Time Calculation (min) (ACUTE ONLY): 25 min  Charges:  $Gait Training: 23-37 mins                    G Codes:       Ellamae Sia, PT, DPT Acute Rehabilitation Services  Pager: [phone]   Willy Eddy 06/21/2017, 1:06 PM

## 2017-06-21 NOTE — Consult Note (Signed)
[facility]  Subjective  - no new neurologic events   Objective (!) 152/70 77 98.2 F (36.8 C) (Oral) 20 98%  Intake/Output Summary (Last 24 hours) at 06/21/2017 0848 Last data filed at 06/21/2017 0400 Gross per 24 hour  Intake 1960 ml  Output -  Net 1960 ml   Neuro intact except profound hearing loss  Assessment/Planning: Discussed possible right CEA with pt and son this morning at bedside.  Risk benefits possible complications including but not limited to bleeding infection stroke 1-2% cranial nerve injury 10% especially with this high lesion, myocardial events 5%  Stroke risk 5-10% within next year with medical management alone.  Pt still not sure if she wants to proceed.  She and her son will discuss further.  I gave them my contact info if they wish to proceed.  Ruta Hinds 06/21/2017 8:48 AM --  Laboratory Lab Results: Recent Labs    06/19/17 2247 06/19/17 2254  WBC 8.1  --   HGB 12.7 12.9  HCT 37.9 38.0  PLT 261  --    BMET Recent Labs    06/19/17 2247 06/19/17 2254  NA 138 141  K 3.9 3.9  CL 102 103  CO2 24  --   GLUCOSE 271* 263*  BUN 9 8  CREATININE 0.86 0.70  CALCIUM 9.0  --     COAG Lab Results  Component Value Date   INR 1.24 06/19/2017   INR 1.08 07/11/2015   No results found for: PTT

## 2017-06-22 ENCOUNTER — Other Ambulatory Visit: Payer: Self-pay | Admitting: *Deleted

## 2017-06-22 ENCOUNTER — Telehealth: Payer: Self-pay | Admitting: Family Medicine

## 2017-06-22 DIAGNOSIS — I482 Chronic atrial fibrillation: Secondary | ICD-10-CM | POA: Diagnosis not present

## 2017-06-22 DIAGNOSIS — N390 Urinary tract infection, site not specified: Secondary | ICD-10-CM | POA: Diagnosis not present

## 2017-06-22 DIAGNOSIS — I69328 Other speech and language deficits following cerebral infarction: Secondary | ICD-10-CM | POA: Diagnosis not present

## 2017-06-22 DIAGNOSIS — I69354 Hemiplegia and hemiparesis following cerebral infarction affecting left non-dominant side: Secondary | ICD-10-CM | POA: Diagnosis not present

## 2017-06-22 DIAGNOSIS — I10 Essential (primary) hypertension: Secondary | ICD-10-CM | POA: Diagnosis not present

## 2017-06-22 DIAGNOSIS — E1159 Type 2 diabetes mellitus with other circulatory complications: Secondary | ICD-10-CM | POA: Diagnosis not present

## 2017-06-22 MED ORDER — INSULIN PEN NEEDLE 31G X 8 MM MISC: 3 refills | Status: DC

## 2017-06-22 NOTE — Telephone Encounter (Signed)
Verbal orders give, by telephone, to Anda Kraft with [facility] for physical therapy for 2x a week for 6 weeks and a social work evaluation to determine additional resources to help with medications.

## 2017-06-22 NOTE — Telephone Encounter (Signed)
Copied from [place]. Topic: Quick Communication - See Telephone Encounter >> Jun 22, 2017  3:02 PM Rutherford Nail, [place] wrote: CRM for notification. See Telephone encounter for: 06/22/17. Gennaro Africa physical therapist with [facility] needing Verbal orders needed for the following: Physical therapy for 2x a week for 6 weeks and a social work evaluation to determine additional resources to help with medications.  CB#: [phone]

## 2017-06-26 ENCOUNTER — Other Ambulatory Visit: Payer: Self-pay | Admitting: *Deleted

## 2017-06-26 MED ORDER — METFORMIN HCL ER 500 MG PO TB24: ORAL_TABLET | ORAL | 1 refills | Status: DC

## 2017-07-14 ENCOUNTER — Ambulatory Visit: Payer: Self-pay | Admitting: *Deleted

## 2017-07-14 NOTE — Telephone Encounter (Signed)
Physical therapist Chelsey from [place] notified called to notify of patient bp  172/94  And  [facility] of [place] states  Patients mental status  Is unchanged  Since her last three visits  Spoke to patients son who states she is  Unchanged and has  So symptoms  She has  Had some recent stressors  - Termites   Called patient she is  Alert talkative speech is intact and she denies any symptoms .She was advised to call if any slurred speech  Weakness or severe headache or other neurological signs.She was  Advised to call 911 if office is closed

## 2017-07-14 NOTE — Telephone Encounter (Signed)
Have her follow BP and pulse at home.. Have her call with results in next few days.

## 2017-07-14 NOTE — Telephone Encounter (Signed)
Ms. Lamia notified as instructed by telephone.

## 2017-07-14 NOTE — Telephone Encounter (Signed)
Pt last seen by dr Diona Browner 05/08/17 and no future appts scheduled.

## 2017-07-17 ENCOUNTER — Telehealth: Payer: Self-pay | Admitting: Family Medicine

## 2017-07-17 NOTE — Telephone Encounter (Signed)
Stay off BP meds as instructed in hospital for now, but follow BP at home.. Call if > 150/90 persitently.

## 2017-07-17 NOTE — Telephone Encounter (Signed)
Ms. Cullop notified as instructed by telephone.

## 2017-07-17 NOTE — Telephone Encounter (Signed)
I spoke with pt to see how she is feeling today; pt said she is feeling pretty good today. No H/A,dizziness, SOB or CP. Pt states she is supposed to have surgery on artery in neck. Pt request cb to let her know if pt should take BP med.

## 2017-07-17 NOTE — Telephone Encounter (Signed)
Copied from [place] [id number]. Topic: General - Other >> Jul 17, 2017  4:20 PM Margot Ables wrote:  Reason for CRM: pt calling with BP - pt states BP meds were d/c in hospital 07/15/17 approx 10:00pm BP 140/80 P 94 07/16/17 approx 10:00pm BP 126/78 P 89 07/17/17 approx 4:00pm BP 140/80 P 94

## 2017-08-17 ENCOUNTER — Ambulatory Visit: Payer: Medicare Other | Admitting: Neurology

## 2017-08-17 ENCOUNTER — Encounter

## 2017-08-18 ENCOUNTER — Other Ambulatory Visit: Payer: Self-pay | Admitting: *Deleted

## 2017-08-18 ENCOUNTER — Telehealth: Payer: Self-pay

## 2017-08-18 MED ORDER — EZETIMIBE 10 MG PO TABS: 10.0000 mg | ORAL_TABLET | Freq: Every day | ORAL | 0 refills | Status: DC

## 2017-08-18 MED ORDER — LEVOTHYROXINE SODIUM 112 MCG PO TABS: 112.0000 ug | ORAL_TABLET | Freq: Every day | ORAL | 0 refills | Status: DC

## 2017-08-18 MED ORDER — INSULIN GLARGINE 100 UNIT/ML SOLOSTAR PEN: 85.0000 [IU] | PEN_INJECTOR | Freq: Every day | SUBCUTANEOUS | 0 refills | Status: DC

## 2017-08-18 MED ORDER — DULOXETINE HCL 30 MG PO CPEP: 60.0000 mg | ORAL_CAPSULE | Freq: Every day | ORAL | 0 refills | Status: DC

## 2017-08-18 MED ORDER — APIXABAN 5 MG PO TABS: 5.0000 mg | ORAL_TABLET | Freq: Two times a day (BID) | ORAL | 0 refills | Status: DC

## 2017-08-18 MED ORDER — GLIPIZIDE 10 MG PO TABS: 10.0000 mg | ORAL_TABLET | Freq: Every day | ORAL | 0 refills | Status: DC

## 2017-08-18 NOTE — Telephone Encounter (Signed)
Copied from [place] [id number]. Topic: General - Other >> Aug 18, 2017 11:44 AM Burchel, Abbi R wrote: Reason for CRM:   Please let Dr Diona Browner know tha pt is currently taking both aspirin EC 81 MG EC tablet  AND apixaban (ELIQUIS) 5 MG TABS tablet daily.  Please advise.  Pt: [phone]

## 2017-08-19 NOTE — Telephone Encounter (Signed)
Ms. Lasky notified by telephone that she should be taking the Eliquis and Aspirin 81 mg daily per Dr. Diona Browner.

## 2017-08-19 NOTE — Telephone Encounter (Signed)
Per recent hospital note.. This is correct and was recommended. Med list already updated.

## 2017-09-04 ENCOUNTER — Ambulatory Visit: Payer: Medicare Other | Admitting: Family Medicine

## 2017-09-11 ENCOUNTER — Encounter: Payer: Self-pay | Admitting: Family Medicine

## 2017-09-11 ENCOUNTER — Ambulatory Visit (INDEPENDENT_AMBULATORY_CARE_PROVIDER_SITE_OTHER): Payer: Medicare Other | Admitting: Family Medicine

## 2017-09-11 VITALS — BP 122/72 | HR 94 | Temp 97.8°F | Ht 62.0 in | Wt 158.0 lb

## 2017-09-11 DIAGNOSIS — E1165 Type 2 diabetes mellitus with hyperglycemia: Secondary | ICD-10-CM

## 2017-09-11 DIAGNOSIS — I639 Cerebral infarction, unspecified: Secondary | ICD-10-CM | POA: Diagnosis not present

## 2017-09-11 DIAGNOSIS — I482 Chronic atrial fibrillation, unspecified: Secondary | ICD-10-CM

## 2017-09-11 DIAGNOSIS — I63239 Cerebral infarction due to unspecified occlusion or stenosis of unspecified carotid arteries: Secondary | ICD-10-CM | POA: Diagnosis not present

## 2017-09-11 DIAGNOSIS — I10 Essential (primary) hypertension: Secondary | ICD-10-CM

## 2017-09-11 DIAGNOSIS — E1159 Type 2 diabetes mellitus with other circulatory complications: Secondary | ICD-10-CM

## 2017-09-11 LAB — POCT GLYCOSYLATED HEMOGLOBIN (HGB A1C): HEMOGLOBIN A1C: 10.4 % — AB (ref 4.0–5.6)

## 2017-09-11 NOTE — Assessment & Plan Note (Addendum)
recurrent expressive aphasia suggesting potentially blood pressure mediated decreased flow, and also her blood pressure medications have been discontinued on discharge and her goal blood pressure should be a little bit higher now.  Today  Good control of BP meds.

## 2017-09-11 NOTE — Assessment & Plan Note (Signed)
Today rate controlled, euvolemic and taking anticoagulant appropriately.

## 2017-09-11 NOTE — Assessment & Plan Note (Signed)
Worsened control. Stay on higher dose of insulin and follow up with ENDO ASAP.

## 2017-09-11 NOTE — Assessment & Plan Note (Signed)
Needs follow up with Vascualr for possibel CEA given recurrently symtpomatic of carotid stenosis.  Empaiszed imporatnce of follow up with pt.  Continue ASA and eliquis

## 2017-09-11 NOTE — Patient Instructions (Addendum)
Call to set up appt with Dr. Nona Dell to set up appt for severe carotid stenosis. VERY IMPORTANT  Poor control DM.Marland Kitchen continue Lantus at 87 units .Marland Kitchen Call to make an appt with Dr. Cruzita Lederer ASAP.

## 2017-09-11 NOTE — Progress Notes (Signed)
Subjective:    Patient ID: Jaclyn [name], female    DOB: 1935/10/18, 82 y.o.   MRN: [id number]  HPI    82 year old female with history atrial fibrillation, TIA, right ICA stenosis, hypertension, diabetes mellitus  presents for hospital follow up on hospitalization 06/19/2017 for altered mental status.  At that time she noted slurred speech, appearing like she was drunk per son. Also with left sided upper extremity numbness.   Dx wth  CVA/TIA and right severe carotid stenosis  Neurology was  consulted and followed patient while hospitalized.She underwent an MRI on 5/4 which was negative for acute or subacute infarct, it did show stable atrophy and white matter disease, moderately advanced for age. She also underwent a CT angiogram of the head and neck which showed right proximal ICA severe greater than 70% stenosis due to calcified plaque, left proximal ICA 50-70% stenosis with calcified plaque. In addition, there are multiple other segments of mild to moderate stenosis.  Vascular surgery was consulted, she was offered to have carotid endarterectomy however patient currently declined and will think about this and follow-up as an outpatient.  Given extensive vascular disease she was placed on aspirin in addition to her Eliquis.  Her lipid panel showed an LDL of 79.  She is allergic to statins.  Hemoglobin A1c was 8.8.  Of note,on 5/4 in the morning, after working with physical therapy patient has experienced recurrent expressive aphasia suggesting potentially blood pressure mediated decreased flow, and also her blood pressure medications have been discontinued on discharge and her goal blood pressure should be a little bit higher now.  2D echo showed normal EF 60 to 65%.  Also treated at that time for UTI with 5 days of cipro.  Pt was to follow up with Dr. Nona Dell vascular in 1 week  Neuro  Dr. Posey Pronto in 1-2 month and Dr, Cruzita Lederer ASAP for DM care.  Today 09/11/17  She has not follow up with Dr.  Nona Dell, Dr. Cruzita Lederer She did not want to do anything  Given daughter was had hip surgery  unwell and needed her. She denies further spells of slurred speech, numbness, weakness  She has noted left headache and neck pain.    Her blood sugars are some better now.. Does not see anything after meals  > 200, no < 60. Does not check fasting  She is taking Lantus 87 Units.  She is also using metformin.  BP doing well off meds.  Blood pressure 122/72, pulse 94, temperature 97.8 F (36.6 C), temperature source Oral, height 5\' 2"  (1.575 m), weight 158 lb (71.7 kg), SpO2 97 %.  Social History /Family History/Past Medical History reviewed in detail and updated in EMR if needed.  Review of Systems  Constitutional: Negative for fatigue and fever.  HENT: Negative for congestion.   Eyes: Negative for pain.  Respiratory: Negative for cough and shortness of breath.   Cardiovascular: Negative for chest pain, palpitations and leg swelling.  Gastrointestinal: Negative for abdominal pain.  Genitourinary: Negative for dysuria and vaginal bleeding.  Musculoskeletal: Negative for back pain.  Neurological: Positive for headaches. Negative for syncope and light-headedness.  Psychiatric/Behavioral: Negative for dysphoric mood.       Objective:   Physical Exam  Constitutional: Vital signs are normal. She appears well-developed and well-nourished. She is cooperative.  Non-toxic appearance. She does not appear ill. No distress.  Pt hard of hearing and with unusual demenor.. Laughing loudly and inappropriately, not clearly understanding importance of follow up  and treatment of healht issues  HENT:  Head: Normocephalic.  Right Ear: Hearing, tympanic membrane, external ear and ear canal normal. Tympanic membrane is not erythematous, not retracted and not bulging.  Left Ear: Hearing, tympanic membrane, external ear and ear canal normal. Tympanic membrane is not erythematous, not retracted and not bulging.  Nose:  No mucosal edema or rhinorrhea. Right sinus exhibits no maxillary sinus tenderness and no frontal sinus tenderness. Left sinus exhibits no maxillary sinus tenderness and no frontal sinus tenderness.  Mouth/Throat: Uvula is midline, oropharynx is clear and moist and mucous membranes are normal.  Eyes: Pupils are equal, round, and reactive to light. Conjunctivae, EOM and lids are normal. Lids are everted and swept, no foreign bodies found.  Neck: Trachea normal and normal range of motion. Neck supple. Carotid bruit is not present. No thyroid mass and no thyromegaly present.  Cardiovascular: Normal rate, regular rhythm, S1 normal, S2 normal, normal heart sounds, intact distal pulses and normal pulses. Exam reveals no gallop and no friction rub.  No murmur heard. Pulmonary/Chest: Effort normal and breath sounds normal. No tachypnea. No respiratory distress. She has no decreased breath sounds. She has no wheezes. She has no rhonchi. She has no rales.  Abdominal: Soft. Normal appearance and bowel sounds are normal. There is no tenderness.  Neurological: She is alert.  Skin: Skin is warm, dry and intact. No rash noted.  Psychiatric: Her speech is normal and behavior is normal. Judgment and thought content normal. Her mood appears not anxious. Cognition and memory are normal. She does not exhibit a depressed mood.          Assessment & Plan:

## 2017-09-11 NOTE — Assessment & Plan Note (Signed)
- 

## 2017-09-30 ENCOUNTER — Other Ambulatory Visit: Payer: Self-pay | Admitting: Family Medicine

## 2017-09-30 NOTE — Telephone Encounter (Signed)
Pt called in to follow up on request. Pt says that she is completely out of her medication

## 2017-12-09 ENCOUNTER — Other Ambulatory Visit: Payer: Self-pay | Admitting: Family Medicine

## 2017-12-09 MED ORDER — TRIMETHOPRIM 100 MG PO TABS: ORAL_TABLET | ORAL | 1 refills | Status: DC

## 2017-12-09 NOTE — Addendum Note (Signed)
Addended by: Carter Kitten on: 12/09/2017 12:30 PM   Modules accepted: Orders

## 2017-12-18 ENCOUNTER — Ambulatory Visit: Payer: Medicare Other | Admitting: Family Medicine

## 2017-12-18 ENCOUNTER — Encounter: Payer: Self-pay | Admitting: Family Medicine

## 2017-12-18 VITALS — BP 130/70 | HR 105 | Temp 97.8°F | Ht 62.0 in | Wt 159.2 lb

## 2017-12-18 DIAGNOSIS — E538 Deficiency of other specified B group vitamins: Secondary | ICD-10-CM | POA: Diagnosis not present

## 2017-12-18 DIAGNOSIS — I10 Essential (primary) hypertension: Secondary | ICD-10-CM

## 2017-12-18 DIAGNOSIS — Z87440 Personal history of urinary (tract) infections: Secondary | ICD-10-CM

## 2017-12-18 DIAGNOSIS — E1165 Type 2 diabetes mellitus with hyperglycemia: Secondary | ICD-10-CM

## 2017-12-18 DIAGNOSIS — R300 Dysuria: Secondary | ICD-10-CM

## 2017-12-18 DIAGNOSIS — E1159 Type 2 diabetes mellitus with other circulatory complications: Secondary | ICD-10-CM | POA: Diagnosis not present

## 2017-12-18 LAB — POC URINALSYSI DIPSTICK (AUTOMATED)
Bilirubin, UA: NEGATIVE
Glucose, UA: POSITIVE — AB
Ketones, UA: NEGATIVE
Leukocytes, UA: NEGATIVE
NITRITE UA: NEGATIVE
PH UA: 5.5 (ref 5.0–8.0)
PROTEIN UA: NEGATIVE
RBC UA: NEGATIVE
SPEC GRAV UA: 1.02 (ref 1.010–1.025)
UROBILINOGEN UA: 0.2 U/dL

## 2017-12-18 LAB — POCT GLYCOSYLATED HEMOGLOBIN (HGB A1C): Hemoglobin A1C: 11.1 % — AB (ref 4.0–5.6)

## 2017-12-18 LAB — HM DIABETES FOOT EXAM

## 2017-12-18 NOTE — Patient Instructions (Addendum)
Call to get appt with ENDO.  Increase lantus to 95 Units daily. Continue other medications.  Please stop at the lab to have labs drawn. Set up yearly eye exam!

## 2017-12-18 NOTE — Progress Notes (Signed)
Subjective:    Patient ID: Jaclyn [name], female    DOB: [birth year], 82 y.o.   MRN: [id number]  HPI  82 year old female presents for 3 month follow up.  DM: Very poor control . On Lantus on 87 untis Next appt with ENDO is  Lab Results  Component Value Date   HGBA1C 11.1 (A) 12/18/2017   HTN good control on current regimen  BP Readings from Last 3 Encounters:  12/18/17 130/70  09/11/17 122/72  06/21/17 (!) 179/78      She feels she may have UTI despite trimethoprim prophylaxis. She has pain in bladder in last week, not exactly burning. No low abd pain. Increased frequency.  No fever. She has urgency.  Last UTI treated in 5.2019 with Cipro.  Sees Dr Andreas Newport Dr. Consuella Lose.   Blood pressure 130/70, pulse (!) 105, temperature 97.8 F (36.6 C), temperature source Oral, height 5\' 2"  (1.575 m), weight 159 lb 4 oz (72.2 kg), SpO2 96 %. Social History /Family History/Past Medical History reviewed in detail and updated in EMR if needed.  Review of Systems  Constitutional: Negative for fatigue and fever.  HENT: Negative for congestion.   Eyes: Negative for pain.  Respiratory: Negative for cough and shortness of breath.   Cardiovascular: Negative for chest pain, palpitations and leg swelling.  Gastrointestinal: Negative for abdominal pain.  Genitourinary: Positive for frequency and urgency. Negative for dysuria and vaginal bleeding.  Musculoskeletal: Negative for back pain.  Neurological: Negative for syncope, light-headedness and headaches.  Psychiatric/Behavioral: Negative for dysphoric mood.       Objective:   Physical Exam  Constitutional: Vital signs are normal. She appears well-developed and well-nourished. She is cooperative.  Non-toxic appearance. She does not appear ill. No distress.  Pt hard of hearing and with unusual demenor.. Laughing loudly and inappropriately, not clearly understanding importance of follow up and treatment of healht issues  HENT:  Head:  Normocephalic.  Right Ear: Hearing, tympanic membrane, external ear and ear canal normal. Tympanic membrane is not erythematous, not retracted and not bulging.  Left Ear: Hearing, tympanic membrane, external ear and ear canal normal. Tympanic membrane is not erythematous, not retracted and not bulging.  Nose: No mucosal edema or rhinorrhea. Right sinus exhibits no maxillary sinus tenderness and no frontal sinus tenderness. Left sinus exhibits no maxillary sinus tenderness and no frontal sinus tenderness.  Mouth/Throat: Uvula is midline, oropharynx is clear and moist and mucous membranes are normal.  Eyes: Pupils are equal, round, and reactive to light. Conjunctivae, EOM and lids are normal. Lids are everted and swept, no foreign bodies found.  Neck: Trachea normal and normal range of motion. Neck supple. Carotid bruit is not present. No thyroid mass and no thyromegaly present.  Cardiovascular: Normal rate, regular rhythm, S1 normal, S2 normal, normal heart sounds, intact distal pulses and normal pulses. Exam reveals no gallop and no friction rub.  No murmur heard. Pulmonary/Chest: Effort normal and breath sounds normal. No tachypnea. No respiratory distress. She has no decreased breath sounds. She has no wheezes. She has no rhonchi. She has no rales.  Abdominal: Soft. Normal appearance and bowel sounds are normal. There is no tenderness.  Neurological: She is alert.  Skin: Skin is warm, dry and intact. No rash noted.  Psychiatric: Her speech is normal and behavior is normal. Judgment and thought content normal. Her mood appears not anxious. Cognition and memory are normal. She does not exhibit a depressed mood.     Diabetic foot exam:  Normal inspection No skin breakdown No calluses  Normal DP pulses Normal sensation to light touch and monofilament Nails normal      Assessment & Plan:

## 2017-12-19 LAB — VITAMIN B12: VITAMIN B 12: 333 pg/mL (ref 200–1100)

## 2018-01-13 ENCOUNTER — Other Ambulatory Visit: Payer: Self-pay | Admitting: Family Medicine

## 2018-01-22 DIAGNOSIS — E538 Deficiency of other specified B group vitamins: Secondary | ICD-10-CM | POA: Insufficient documentation

## 2018-01-22 NOTE — Assessment & Plan Note (Signed)
Due for re-eval. 

## 2018-01-22 NOTE — Assessment & Plan Note (Signed)
Well controlled. Continue current medication.  

## 2018-01-22 NOTE — Assessment & Plan Note (Addendum)
Not clear dysuria, pt poor historian . May be due to  Poor diabetes control. Unclear if due to UTI.Jaclyn [name] Unable to get urine sample today... If symptoms recur... Follow up.  Continue prophylaxsis

## 2018-01-22 NOTE — Assessment & Plan Note (Addendum)
Call to get appt with ENDO.  Increase lantus to 95 Units daily. Continue other medications.   Poor control likely contributing to urinary frequency.

## 2018-03-19 ENCOUNTER — Other Ambulatory Visit: Payer: Self-pay | Admitting: Family Medicine

## 2018-03-26 ENCOUNTER — Ambulatory Visit: Payer: Medicare Other | Admitting: Family Medicine

## 2018-04-01 ENCOUNTER — Other Ambulatory Visit: Payer: Self-pay | Admitting: Family Medicine

## 2018-04-23 ENCOUNTER — Ambulatory Visit: Payer: Medicare Other | Admitting: Family Medicine

## 2018-04-28 ENCOUNTER — Other Ambulatory Visit: Payer: Self-pay | Admitting: Family Medicine

## 2018-04-28 NOTE — Telephone Encounter (Signed)
Last office visit 12/18/2017 for DM.  No future appointments.  Has cancelled las 2 appointments Last thyroid labs 04/07/2016.  Refill?

## 2018-04-30 ENCOUNTER — Other Ambulatory Visit: Payer: Self-pay | Admitting: Family Medicine

## 2018-04-30 NOTE — Telephone Encounter (Signed)
Pt left lengthy v/m that that she is upset that Dr Diona Browner will not refill her insulin and levothyroxine. Pt said she is not coming to [facility] and take a chance of getting exposed to who knows what. Pt is high risk with her being 83 yrs old and her physical conditions including diabetes. Pt wants insulin and levothyroxine filled today to [facility] [address]. Pt request cb if Dr Diona Browner will not fill rxs.  04/30/2018 response from Dr. Diona Browner (see other message also)Will refill given current climate but pt will need to make appt... maybe schedule in 1-2 months so it is on the books. Okay to refill until then.  Left detailed message on patient's voicemail advising that RXs were sent in and that patient needs to call back to schedule an appointment for 1 to 2 months before further refills.

## 2018-04-30 NOTE — Telephone Encounter (Signed)
Will refill given current climate but pt will need to make appt... maybe schedule in 1-2 months so it is on the books. Okay to refill until then

## 2018-04-30 NOTE — Telephone Encounter (Signed)
See other message

## 2018-04-30 NOTE — Telephone Encounter (Signed)
Pt left lengthy v/m that that she is upset that Dr Diona Browner will not refill her insulin and levothyroxine. Pt said she is not coming to [facility] and take a chance of getting exposed to who knows what. Pt is high risk with her being 83 yrs old and her physical conditions including diabetes. Pt wants insulin and levothyroxine filled today to [facility] [address]. Pt request cb if Dr Diona Browner will not fill rxs.

## 2018-05-23 ENCOUNTER — Telehealth: Payer: Self-pay | Admitting: Family Medicine

## 2018-05-24 NOTE — Telephone Encounter (Signed)
Needs appt for Dm follow up and thyroid [facility]) with fasting labs prior (curbside). Will refill once.

## 2018-05-24 NOTE — Telephone Encounter (Signed)
Left message asking pt to call office  °

## 2018-05-24 NOTE — Telephone Encounter (Signed)
Last office visit 12/18/2017 for DM.  Last thyroid labs 04/07/2016.  No future appointments.

## 2018-05-26 NOTE — Telephone Encounter (Signed)
Left message asking pt to call office  °

## 2018-05-27 NOTE — Telephone Encounter (Signed)
Pt stated she won't be coming out the house to do any labs and she still want her medications. Pt don't have Internet.  Please advise

## 2018-05-27 NOTE — Telephone Encounter (Signed)
Okay.. schedule  appt with labs prior in 2 months Refill until then.

## 2018-05-31 NOTE — Telephone Encounter (Signed)
Left message asking pt to call office  °

## 2018-06-07 NOTE — Telephone Encounter (Signed)
Left message asking pt to call office  °

## 2018-06-08 NOTE — Telephone Encounter (Signed)
Appointment 7/24

## 2018-07-09 ENCOUNTER — Other Ambulatory Visit: Payer: Self-pay | Admitting: Family Medicine

## 2018-08-05 ENCOUNTER — Telehealth: Payer: Self-pay

## 2018-08-05 MED ORDER — METFORMIN HCL 500 MG PO TABS: 1000.0000 mg | ORAL_TABLET | Freq: Two times a day (BID) | ORAL | 3 refills | Status: DC

## 2018-08-05 NOTE — Telephone Encounter (Signed)
Melissa at [address] said she was talking with pt since metformin has been recalled. Melissa said that pt had not been taking metformin XR 500 mg due to causing upset stomach. Pt BS was increasing so pt restarted taking the metformin XR. Metformin is now recalled and request substitute sent to [facility] [address]. Melissa thinks pt is taking her Lantus but not sure of amt of insulin pt is taking. On 12/18/2017 office note pt was to increase lantus to 95 units daily and to call endo. Unable to reach pt by phone.FYI to Dr Diona Browner and Butch Penny CMA.

## 2018-08-05 NOTE — Telephone Encounter (Signed)
Metformin ER recalled.. sent in intermediate acting to take 2 tabs BID Please call to set follow up with labs prior

## 2018-08-05 NOTE — Telephone Encounter (Signed)
Left message asking pt to call office  °

## 2018-08-06 NOTE — Telephone Encounter (Signed)
Patient stated she already has appt scheduled for 7/24 and just wanted to keep that appt and didn't really want to be seen sooner. She did schedule the labs before the appointment.

## 2018-08-09 NOTE — Telephone Encounter (Signed)
reviewed

## 2018-09-02 ENCOUNTER — Telehealth: Payer: Self-pay

## 2018-09-02 NOTE — Telephone Encounter (Signed)
Left message to call clinic, needs COVID screen and back door lab info   

## 2018-09-06 ENCOUNTER — Other Ambulatory Visit (INDEPENDENT_AMBULATORY_CARE_PROVIDER_SITE_OTHER): Payer: Medicare Other

## 2018-09-06 ENCOUNTER — Telehealth: Payer: Self-pay | Admitting: Family Medicine

## 2018-09-06 DIAGNOSIS — E559 Vitamin D deficiency, unspecified: Secondary | ICD-10-CM

## 2018-09-06 DIAGNOSIS — E538 Deficiency of other specified B group vitamins: Secondary | ICD-10-CM

## 2018-09-06 DIAGNOSIS — E785 Hyperlipidemia, unspecified: Secondary | ICD-10-CM

## 2018-09-06 DIAGNOSIS — E1165 Type 2 diabetes mellitus with hyperglycemia: Secondary | ICD-10-CM | POA: Diagnosis not present

## 2018-09-06 NOTE — Telephone Encounter (Signed)
-----   Message from Ellamae Sia sent at 08/31/2018  2:25 PM EDT ----- Regarding: Lab orders for Monday, 7.20.20 Patient is scheduled for CPX labs, please order future labs, Thanks , Karna Christmas

## 2018-09-07 LAB — COMPREHENSIVE METABOLIC PANEL
ALT: 21 U/L (ref 0–35)
AST: 17 U/L (ref 0–37)
Albumin: 4.2 g/dL (ref 3.5–5.2)
Alkaline Phosphatase: 71 U/L (ref 39–117)
BUN: 13 mg/dL (ref 6–23)
CO2: 25 mEq/L (ref 19–32)
Calcium: 9.2 mg/dL (ref 8.4–10.5)
Chloride: 98 mEq/L (ref 96–112)
Creatinine, Ser: 0.9 mg/dL (ref 0.40–1.20)
GFR: 60.08 mL/min (ref >60.00)
Glucose, Bld: 499 mg/dL — ABNORMAL HIGH (ref 70–99)
Potassium: 4.2 mEq/L (ref 3.5–5.1)
Sodium: 135 mEq/L (ref 135–145)
Total Bilirubin: 0.5 mg/dL (ref 0.2–1.2)
Total Protein: 6.7 g/dL (ref 6.0–8.3)

## 2018-09-07 LAB — HEMOGLOBIN A1C: Hgb A1c MFr Bld: 12.7 % — ABNORMAL HIGH (ref 4.6–6.5)

## 2018-09-07 LAB — LIPID PANEL
Cholesterol: 239 mg/dL — ABNORMAL HIGH (ref 0–200)
HDL: 39.2 mg/dL (ref >39.00)
Total CHOL/HDL Ratio: 6
Triglycerides: 438 mg/dL — ABNORMAL HIGH (ref 0.0–149.0)

## 2018-09-07 LAB — LDL CHOLESTEROL, DIRECT: Direct LDL: 146 mg/dL

## 2018-09-07 LAB — VITAMIN B12: Vitamin B-12: 148 pg/mL — ABNORMAL LOW (ref 211–911)

## 2018-09-07 LAB — VITAMIN D 25 HYDROXY (VIT D DEFICIENCY, FRACTURES): VITD: 20.67 ng/mL — ABNORMAL LOW (ref 30.00–100.00)

## 2018-09-10 ENCOUNTER — Encounter: Payer: Self-pay | Admitting: Family Medicine

## 2018-09-10 ENCOUNTER — Ambulatory Visit (INDEPENDENT_AMBULATORY_CARE_PROVIDER_SITE_OTHER): Payer: Medicare Other | Admitting: Family Medicine

## 2018-09-10 ENCOUNTER — Ambulatory Visit: Payer: Medicare Other | Admitting: Family Medicine

## 2018-09-10 VITALS — Ht 62.0 in

## 2018-09-10 DIAGNOSIS — E1159 Type 2 diabetes mellitus with other circulatory complications: Secondary | ICD-10-CM | POA: Diagnosis not present

## 2018-09-10 DIAGNOSIS — Z87440 Personal history of urinary (tract) infections: Secondary | ICD-10-CM

## 2018-09-10 DIAGNOSIS — E559 Vitamin D deficiency, unspecified: Secondary | ICD-10-CM

## 2018-09-10 DIAGNOSIS — B372 Candidiasis of skin and nail: Secondary | ICD-10-CM

## 2018-09-10 DIAGNOSIS — E038 Other specified hypothyroidism: Secondary | ICD-10-CM

## 2018-09-10 DIAGNOSIS — E785 Hyperlipidemia, unspecified: Secondary | ICD-10-CM

## 2018-09-10 DIAGNOSIS — E538 Deficiency of other specified B group vitamins: Secondary | ICD-10-CM

## 2018-09-10 DIAGNOSIS — I10 Essential (primary) hypertension: Secondary | ICD-10-CM

## 2018-09-10 DIAGNOSIS — I480 Paroxysmal atrial fibrillation: Secondary | ICD-10-CM

## 2018-09-10 MED ORDER — PRAVASTATIN SODIUM 20 MG PO TABS: 20.0000 mg | ORAL_TABLET | Freq: Every day | ORAL | 3 refills | Status: DC

## 2018-09-10 MED ORDER — NYSTATIN 100000 UNIT/GM EX CREA: 1.0000 "application " | TOPICAL_CREAM | Freq: Two times a day (BID) | CUTANEOUS | 1 refills | Status: DC

## 2018-09-10 MED ORDER — DULOXETINE HCL 30 MG PO CPEP: 60.0000 mg | ORAL_CAPSULE | Freq: Every day | ORAL | 1 refills | Status: DC

## 2018-09-10 MED ORDER — LEVOTHYROXINE SODIUM 112 MCG PO TABS: 112.0000 ug | ORAL_TABLET | Freq: Every day | ORAL | 3 refills | Status: DC

## 2018-09-10 MED ORDER — EZETIMIBE 10 MG PO TABS: 10.0000 mg | ORAL_TABLET | Freq: Every day | ORAL | 3 refills | Status: DC

## 2018-09-10 MED ORDER — LANTUS SOLOSTAR 100 UNIT/ML ~~LOC~~ SOPN: 90.0000 [IU] | PEN_INJECTOR | Freq: Every day | SUBCUTANEOUS | 0 refills | Status: DC

## 2018-09-10 MED ORDER — GLIPIZIDE 10 MG PO TABS: 10.0000 mg | ORAL_TABLET | Freq: Every day | ORAL | 3 refills | Status: DC

## 2018-09-10 MED ORDER — VITAMIN B-12 1000 MCG PO TABS: 1000.0000 ug | ORAL_TABLET | Freq: Every day | ORAL | 11 refills | Status: DC

## 2018-09-10 MED ORDER — TRIMETHOPRIM 100 MG PO TABS: ORAL_TABLET | ORAL | 3 refills | Status: DC

## 2018-09-10 MED ORDER — APIXABAN 5 MG PO TABS: 5.0000 mg | ORAL_TABLET | Freq: Two times a day (BID) | ORAL | 1 refills | Status: DC

## 2018-09-10 MED ORDER — VITAMIN D (ERGOCALCIFEROL) 1.25 MG (50000 UNIT) PO CAPS: 50000.0000 [IU] | ORAL_CAPSULE | ORAL | 0 refills | Status: DC

## 2018-09-10 NOTE — Assessment & Plan Note (Signed)
Not at goal. Restart zetia and add pravastatin low dose.Marland Kitchen re-eval in 3 months.

## 2018-09-10 NOTE — Assessment & Plan Note (Signed)
Replete

## 2018-09-10 NOTE — Assessment & Plan Note (Signed)
Refilled nitrofurantoin.

## 2018-09-10 NOTE — Assessment & Plan Note (Signed)
Well controlled. Continue current medication.  

## 2018-09-10 NOTE — Assessment & Plan Note (Signed)
Refilled eliquis 

## 2018-09-10 NOTE — Assessment & Plan Note (Signed)
Nystatin topical creme.

## 2018-09-10 NOTE — Assessment & Plan Note (Signed)
Refill levothyroxine .  Re-eval due next OV.

## 2018-09-10 NOTE — Assessment & Plan Note (Signed)
Very poor control .. pt noncompliant. Encouraged her to be compliant with meds. Recommended return to ENDO, but she would prefer to re-eval in 3 months with labs here.

## 2018-09-10 NOTE — Patient Instructions (Addendum)
Start vit D 50000 units weekly.  Start B12 1000 mg daily   Take diabetes medicaitons every day  Start pravastatin 20 mg daily for cholesterol. Take in addition to  zetia  Can use Nystatin cream for yeast.

## 2018-09-10 NOTE — Progress Notes (Signed)
VIRTUAL VISIT Due to national recommendations of social distancing due to [place] 19, a virtual visit is felt to be most appropriate for this patient at this time.   I connected with the patient on 09/10/18 at  3:20 PM EDT by virtual telehealth platform and verified that I am speaking with the correct person using two identifiers.   Interactive audio and video telecommunications were attempted between this provider and patient, however failed, due to patient having technical difficulties OR patient did not have access to video capability.  We continued and completed visit with audio only.   I discussed the limitations, risks, security and privacy concerns of performing an evaluation and management service by  virtual telehealth platform and the availability of in person appointments. I also discussed with the patient that there may be a patient responsible charge related to this service. The patient expressed understanding and agreed to proceed.  Patient location: Home Provider Location: [facility] Participants: Eliezer Lofts and Hilma Favors   Chief Complaint  Patient presents with  . Medication Refill    History of Present Illness: 83 year old female present for follow up DM cholesterol, HTN She states she needs refills of all her medications.  She has been  noncompliant with DM meds.    She is no longer seeing ENDO.  Diabetes:  Poor control. Pt noncompliant.  Metformin max (has been skipping doses thinks causing intermittent abd pain), glipizide, lantus 87 units (frequently skips)  Now abd pain gone on different metfromin Lab Results  Component Value Date   HGBA1C 12.7 (H) 09/06/2018  Using medications without difficulties: Hypoglycemic episodes:none Hyperglycemic episodes: frequent when was not taking her medication. 499 Feet problems: no ulcers Blood Sugars averaging: FBS 112- 172 eye exam within last year:   Elevated Cholesterol:  Poor control on Zetia. Refuses  statin. Lab Results  Component Value Date   CHOL 239 (H) 09/06/2018   HDL 39.20 09/06/2018   LDLCALC 79 06/20/2017   LDLDIRECT 146.0 09/06/2018   TRIG (H) 09/06/2018    438.0 Triglyceride is over 400; calculations on Lipids are invalid.   CHOLHDL 6 09/06/2018  Using medications without problems: Muscle aches:  Diet compliance: moderate Exercise:poor Other complaints:  Hypertension:   Previously well controlled on current regimen  BP Readings from Last 3 Encounters:  12/18/17 130/70  09/11/17 122/72  06/21/17 (!) 179/78  Using medication without problems or lightheadedness: none Chest pain with exertion:none Edema:none Short of breath:none Average home BPs: Other issues:  Vit D and Vit B12 low   COVID 19 screen No recent travel or known exposure to [place] The patient denies respiratory symptoms of COVID 19 at this time.  The importance of social distancing was discussed today.   Review of Systems  Constitutional: Negative for chills and fever.  HENT: Negative for congestion and ear pain.   Eyes: Negative for pain and redness.  Respiratory: Negative for cough and shortness of breath.   Cardiovascular: Negative for chest pain, palpitations and leg swelling.  Gastrointestinal: Negative for abdominal pain, blood in stool, constipation, diarrhea, nausea and vomiting.  Genitourinary: Negative for dysuria.  Musculoskeletal: Negative for falls and myalgias.  Skin: Positive for rash.  Neurological: Negative for dizziness.  Psychiatric/Behavioral: Negative for depression. The patient is not nervous/anxious.       Past Medical History:  Diagnosis Date  . Atrial fibrillation ([place])   . Benign neoplasm of colon   . Carotid artery occlusion    60-79% right ICA stenosis  .  Carotid bruit   . Cerebrovascular disease, unspecified   . Difficult intubation 2008   During surgery to remove large polyp  . Diverticulosis of colon (without mention of hemorrhage)   . Dysthymic  disorder   . Fibromyalgia   . Headache(784.0)   . Insomnia, unspecified   . Interstitial cystitis   . Myalgia and myositis, unspecified   . Osteoarthrosis, unspecified whether generalized or localized, unspecified site   . Other and unspecified hyperlipidemia   . Other chest pain   . Other specified benign mammary dysplasias   . TIA (transient ischemic attack)   . Type II or unspecified type diabetes mellitus without mention of complication, not stated as uncontrolled   . Unspecified essential hypertension   . Unspecified hypothyroidism   . Unspecified vitamin D deficiency   . Urinary tract infection, site not specified     reports that she has never smoked. She has never used smokeless tobacco. She reports that she does not drink alcohol or use drugs.   Current Outpatient Medications:  .  aspirin EC 81 MG EC tablet, Take 1 tablet (81 mg total) by mouth daily., Disp: 30 tablet, Rfl: 1 .  DULoxetine (CYMBALTA) 30 MG capsule, Take 2 capsules by mouth once daily, Disp: 180 capsule, Rfl: 0 .  ELIQUIS 5 MG TABS tablet, Take 1 tablet by mouth twice daily, Disp: 180 tablet, Rfl: 0 .  glipiZIDE (GLUCOTROL) 10 MG tablet, TAKE 1 TABLET BY MOUTH ONCE DAILY BEFORE BREAKFAST, Disp: 90 tablet, Rfl: 0 .  Insulin Pen Needle (B-D ULTRAFINE III SHORT PEN) 31G X 8 MM MISC, Use to inject insulin once daily.  Dx: E11.65, Disp: 90 each, Rfl: 3 .  LANTUS SOLOSTAR 100 UNIT/ML Solostar Pen, INJECT 87 UNITS SUBCUTANEOUSLY ONCE DAILY AT BEDTIME, Disp: 15 mL, Rfl: 0 .  levothyroxine (SYNTHROID, LEVOTHROID) 112 MCG tablet, Take 1 tablet by mouth once daily, Disp: 30 tablet, Rfl: 0 .  metFORMIN (GLUCOPHAGE) 500 MG tablet, Take 2 tablets (1,000 mg total) by mouth 2 (two) times daily with a meal., Disp: 180 tablet, Rfl: 3 .  trimethoprim (TRIMPEX) 100 MG tablet, TAKE 1 TABLET BY MOUTH ONCE DAILY FOR UTI PROPHYLAXSIS, Disp: 90 tablet, Rfl: 1 .  ezetimibe (ZETIA) 10 MG tablet, TAKE 1 TABLET BY MOUTH ONCE DAILY (Patient  not taking: Reported on 09/10/2018), Disp: 90 tablet, Rfl: 1   Observations/Objective: Height 5\' 2"  (1.575 m).  Physical Exam  Physical Exam Constitutional:      General: The patient is not in acute distress. Pulmonary:     Effort: Pulmonary effort is normal. No respiratory distress.  Neurological:     Mental Status: The patient is alert and oriented to person, place, and time.  Psychiatric:        Mood and Affect: Mood normal.        Behavior: Behavior normal.   Assessment and Plan    I discussed the assessment and treatment plan with the patient. The patient was provided an opportunity to ask questions and all were answered. The patient agreed with the plan and demonstrated an understanding of the instructions.   The patient was advised to call back or seek an in-person evaluation if the symptoms worsen or if the condition fails to improve as anticipated.   Total visit time 30 minutes, > 50% spent counseling and cordinating patients care.   Eliezer Lofts, MD

## 2018-09-13 NOTE — Progress Notes (Signed)
Appointment 10/30 labs same day 7/27 Pt aware

## 2018-09-13 NOTE — Progress Notes (Signed)
AVS mailed to patient as instructed by Dr. Bedsole. 

## 2018-10-15 ENCOUNTER — Other Ambulatory Visit: Payer: Self-pay | Admitting: Family Medicine

## 2018-12-17 ENCOUNTER — Ambulatory Visit: Payer: Medicare Other | Admitting: Family Medicine

## 2018-12-31 ENCOUNTER — Ambulatory Visit: Payer: Medicare Other | Admitting: Family Medicine

## 2019-01-07 ENCOUNTER — Telehealth: Payer: Self-pay | Admitting: *Deleted

## 2019-01-07 ENCOUNTER — Ambulatory Visit: Payer: Medicare Other | Admitting: Family Medicine

## 2019-01-07 NOTE — Telephone Encounter (Signed)
Receieved fax from [facility] asking if they can change manufacturers on patient's thyroid medication.  Patient no showed appointment today.  Last thyroid labs was done in 2018.  Please advise.  Fax in your in box on your desk for review.

## 2019-01-07 NOTE — Telephone Encounter (Signed)
Okay to change manufacturers. Needs TSH in 4 weeks.

## 2019-01-07 NOTE — Telephone Encounter (Signed)
[facility] notified okay to change manufacturers on levothyroxine and that patient will need to have her TSH drawn in 4 weeks via fax.  Will forward to Robin to schedule lab appointment in 4 weeks and reschedule appointment with Dr. Diona Browner that she missed today.

## 2019-01-10 NOTE — Telephone Encounter (Signed)
Left message asking pt to call office  °

## 2019-01-12 ENCOUNTER — Other Ambulatory Visit: Payer: Self-pay | Admitting: Family Medicine

## 2019-01-14 ENCOUNTER — Encounter (HOSPITAL_COMMUNITY): Payer: Self-pay

## 2019-01-14 ENCOUNTER — Inpatient Hospital Stay (HOSPITAL_COMMUNITY)
Admission: EM | Admit: 2019-01-14 | Discharge: 2019-01-20 | DRG: 177 | Disposition: A | Discharge status: Home Health | Payer: Medicare Other | Attending: Internal Medicine | Admitting: Internal Medicine

## 2019-01-14 ENCOUNTER — Emergency Department (HOSPITAL_COMMUNITY): Payer: Medicare Other

## 2019-01-14 ENCOUNTER — Other Ambulatory Visit: Payer: Self-pay

## 2019-01-14 DIAGNOSIS — Z8673 Personal history of transient ischemic attack (TIA), and cerebral infarction without residual deficits: Secondary | ICD-10-CM | POA: Diagnosis not present

## 2019-01-14 DIAGNOSIS — R9431 Abnormal electrocardiogram [ECG] [EKG]: Secondary | ICD-10-CM | POA: Diagnosis not present

## 2019-01-14 DIAGNOSIS — E1165 Type 2 diabetes mellitus with hyperglycemia: Secondary | ICD-10-CM | POA: Diagnosis present

## 2019-01-14 DIAGNOSIS — F29 Unspecified psychosis not due to a substance or known physiological condition: Secondary | ICD-10-CM | POA: Diagnosis not present

## 2019-01-14 DIAGNOSIS — E785 Hyperlipidemia, unspecified: Secondary | ICD-10-CM | POA: Diagnosis not present

## 2019-01-14 DIAGNOSIS — Z792 Long term (current) use of antibiotics: Secondary | ICD-10-CM

## 2019-01-14 DIAGNOSIS — B961 Klebsiella pneumoniae [K. pneumoniae] as the cause of diseases classified elsewhere: Secondary | ICD-10-CM | POA: Diagnosis present

## 2019-01-14 DIAGNOSIS — T380X5A Adverse effect of glucocorticoids and synthetic analogues, initial encounter: Secondary | ICD-10-CM | POA: Diagnosis present

## 2019-01-14 DIAGNOSIS — Z7982 Long term (current) use of aspirin: Secondary | ICD-10-CM

## 2019-01-14 DIAGNOSIS — G9341 Metabolic encephalopathy: Secondary | ICD-10-CM | POA: Diagnosis not present

## 2019-01-14 DIAGNOSIS — R531 Weakness: Secondary | ICD-10-CM | POA: Diagnosis not present

## 2019-01-14 DIAGNOSIS — M2550 Pain in unspecified joint: Secondary | ICD-10-CM | POA: Diagnosis not present

## 2019-01-14 DIAGNOSIS — I4891 Unspecified atrial fibrillation: Secondary | ICD-10-CM | POA: Diagnosis present

## 2019-01-14 DIAGNOSIS — E038 Other specified hypothyroidism: Secondary | ICD-10-CM | POA: Diagnosis not present

## 2019-01-14 DIAGNOSIS — N390 Urinary tract infection, site not specified: Secondary | ICD-10-CM | POA: Diagnosis present

## 2019-01-14 DIAGNOSIS — H9190 Unspecified hearing loss, unspecified ear: Secondary | ICD-10-CM | POA: Diagnosis not present

## 2019-01-14 DIAGNOSIS — U071 COVID-19: Secondary | ICD-10-CM | POA: Diagnosis not present

## 2019-01-14 DIAGNOSIS — J9811 Atelectasis: Secondary | ICD-10-CM | POA: Diagnosis not present

## 2019-01-14 DIAGNOSIS — I480 Paroxysmal atrial fibrillation: Secondary | ICD-10-CM | POA: Diagnosis not present

## 2019-01-14 DIAGNOSIS — Z91040 Latex allergy status: Secondary | ICD-10-CM

## 2019-01-14 DIAGNOSIS — F329 Major depressive disorder, single episode, unspecified: Secondary | ICD-10-CM | POA: Diagnosis present

## 2019-01-14 DIAGNOSIS — Z87440 Personal history of urinary (tract) infections: Secondary | ICD-10-CM | POA: Diagnosis not present

## 2019-01-14 DIAGNOSIS — Z823 Family history of stroke: Secondary | ICD-10-CM

## 2019-01-14 DIAGNOSIS — I10 Essential (primary) hypertension: Secondary | ICD-10-CM | POA: Diagnosis present

## 2019-01-14 DIAGNOSIS — F419 Anxiety disorder, unspecified: Secondary | ICD-10-CM | POA: Diagnosis not present

## 2019-01-14 DIAGNOSIS — E039 Hypothyroidism, unspecified: Secondary | ICD-10-CM | POA: Diagnosis present

## 2019-01-14 DIAGNOSIS — I6521 Occlusion and stenosis of right carotid artery: Secondary | ICD-10-CM | POA: Diagnosis present

## 2019-01-14 DIAGNOSIS — N3000 Acute cystitis without hematuria: Secondary | ICD-10-CM | POA: Diagnosis present

## 2019-01-14 DIAGNOSIS — Z8349 Family history of other endocrine, nutritional and metabolic diseases: Secondary | ICD-10-CM

## 2019-01-14 DIAGNOSIS — I499 Cardiac arrhythmia, unspecified: Secondary | ICD-10-CM | POA: Diagnosis not present

## 2019-01-14 DIAGNOSIS — J9601 Acute respiratory failure with hypoxia: Secondary | ICD-10-CM | POA: Diagnosis present

## 2019-01-14 DIAGNOSIS — Z794 Long term (current) use of insulin: Secondary | ICD-10-CM

## 2019-01-14 DIAGNOSIS — Z7401 Bed confinement status: Secondary | ICD-10-CM | POA: Diagnosis not present

## 2019-01-14 DIAGNOSIS — E1159 Type 2 diabetes mellitus with other circulatory complications: Secondary | ICD-10-CM | POA: Diagnosis not present

## 2019-01-14 DIAGNOSIS — Z91013 Allergy to seafood: Secondary | ICD-10-CM

## 2019-01-14 DIAGNOSIS — Z882 Allergy status to sulfonamides status: Secondary | ICD-10-CM | POA: Diagnosis not present

## 2019-01-14 DIAGNOSIS — J1289 Other viral pneumonia: Secondary | ICD-10-CM | POA: Diagnosis present

## 2019-01-14 DIAGNOSIS — E876 Hypokalemia: Secondary | ICD-10-CM | POA: Diagnosis not present

## 2019-01-14 DIAGNOSIS — Z23 Encounter for immunization: Secondary | ICD-10-CM

## 2019-01-14 DIAGNOSIS — Z7901 Long term (current) use of anticoagulants: Secondary | ICD-10-CM

## 2019-01-14 DIAGNOSIS — Z8249 Family history of ischemic heart disease and other diseases of the circulatory system: Secondary | ICD-10-CM

## 2019-01-14 DIAGNOSIS — D649 Anemia, unspecified: Secondary | ICD-10-CM | POA: Diagnosis not present

## 2019-01-14 DIAGNOSIS — E559 Vitamin D deficiency, unspecified: Secondary | ICD-10-CM | POA: Diagnosis present

## 2019-01-14 DIAGNOSIS — R29810 Facial weakness: Secondary | ICD-10-CM | POA: Diagnosis not present

## 2019-01-14 LAB — CBG MONITORING, ED: Glucose-Capillary: 227 mg/dL — ABNORMAL HIGH (ref 70–99)

## 2019-01-14 MED ORDER — SODIUM CHLORIDE 0.9 % IV BOLUS (SEPSIS): 250.0000 mL | Freq: Once | INTRAVENOUS | Status: DC

## 2019-01-14 MED ORDER — SODIUM CHLORIDE 0.9 % IV BOLUS (SEPSIS)
1000.0000 mL | Freq: Once | INTRAVENOUS | Status: AC
  Administered 2019-01-15: 1000 mL via INTRAVENOUS

## 2019-01-14 MED ORDER — ACETAMINOPHEN 650 MG RE SUPP
650.0000 mg | Freq: Once | RECTAL | Status: AC
  Administered 2019-01-15: 650 mg via RECTAL
  Filled 2019-01-14: qty 1

## 2019-01-14 MED ORDER — SODIUM CHLORIDE 0.9 % IV BOLUS (SEPSIS)
1000.0000 mL | Freq: Once | INTRAVENOUS | Status: AC
  Administered 2019-01-14: 1000 mL via INTRAVENOUS

## 2019-01-14 NOTE — ED Provider Notes (Signed)
[facility] EMERGENCY DEPARTMENT Provider Note   CSN: [id number] Arrival date & time: 01/14/19  2251     History   Chief Complaint Chief Complaint  Patient presents with  . Altered Mental Status  . Dysuria    HPI Jaclyn [name] is a 83 y.o. female.     The history is provided by the patient.  Altered Mental Status Presenting symptoms: confusion   Severity:  Moderate Timing:  Constant Progression:  Unchanged Chronicity:  New Associated symptoms: fever   Dysuria Associated symptoms: fever   Patient with history of atrial fibrillation on Eliquis, history of stroke, history fibromyalgia, history of hypothyroidism presents with fever and confusion. EMS reports that patient has had altered mental status since yesterday.  Son had reported that she was acting differently.  Patient is currently awake and alert.  She is hard of hearing, but is otherwise able to answer all questions appropriately.  She reports feeling generalized weakness but has no other complaints  Past Medical History:  Diagnosis Date  . Atrial fibrillation ([place])   . Benign neoplasm of colon   . Carotid artery occlusion    60-79% right ICA stenosis  . Carotid bruit   . Cerebrovascular disease, unspecified   . Difficult intubation 2008   During surgery to remove large polyp  . Diverticulosis of colon (without mention of hemorrhage)   . Dysthymic disorder   . Fibromyalgia   . Headache(784.0)   . Insomnia, unspecified   . Interstitial cystitis   . Myalgia and myositis, unspecified   . Osteoarthrosis, unspecified whether generalized or localized, unspecified site   . Other and unspecified hyperlipidemia   . Other chest pain   . Other specified benign mammary dysplasias   . TIA (transient ischemic attack)   . Type II or unspecified type diabetes mellitus without mention of complication, not stated as uncontrolled   . Unspecified essential hypertension   . Unspecified hypothyroidism    . Unspecified vitamin D deficiency   . Urinary tract infection, site not specified     Patient Active Problem List   Diagnosis Date Noted  . B12 deficiency 01/22/2018  . Acute CVA (cerebrovascular accident) ([place]) 06/20/2017  . Type 2 diabetes mellitus with vascular disease ([place]) 06/20/2017  . Slurred speech   . Stenosis of right carotid artery   . Peripheral edema 07/29/2016  . Decreased alertness 07/07/2016  . Imbalance 04/07/2016  . Intertriginous candidiasis 04/26/2015  . Hard of hearing 04/12/2015  . TIA (transient ischemic attack) 04/10/2015  . History of recurrent UTIs 04/10/2015  . Dysuria 03/09/2015  . Tremor 05/11/2014  . Leg weakness, bilateral 05/11/2014  . Fatty liver 08/04/2013  . Personal history of colonic polyps 10/05/2012  . Esophageal reflux 10/05/2012  . Carotid stenosis, symptomatic, with infarction ([place]) 10/14/2010  . ANXIETY DEPRESSION 01/13/2008  . Essential hypertension, benign 01/13/2008  . PAROXYSMAL ATRIAL FIBRILLATION 01/13/2008  . Intracranial vascular stenosis 01/13/2008  . DIVERTICULOSIS OF COLON 01/13/2008  . Fibromyalgia 01/13/2008  . CHEST PAIN, ATYPICAL 01/13/2008  . Hypothyroidism 04/13/2007  . Poorly controlled diabetes mellitus ([place]) 04/13/2007  . Vitamin D deficiency 04/13/2007  . Hyperlipidemia LDL goal <70 04/13/2007  . DEGENERATIVE JOINT DISEASE 04/13/2007    Past Surgical History:  Procedure Laterality Date  . ABDOMINAL HYSTERECTOMY  1988   cervical dysplasia  . CARDIAC CATHETERIZATION  02/19/06   EF 60%  . COLONOSCOPY    . RIGHT COLECTOMY  2005   for villous adenoma of the cecum Dr.streck  .  VESICOVAGINAL FISTULA CLOSURE W/ TAH  1990   w/ cystocele &retocele repairs Dr. Ree Edman     OB History   No obstetric history on file.      Home Medications    Prior to Admission medications   Medication Sig Start Date End Date Taking? Authorizing Provider  apixaban (ELIQUIS) 5 MG TABS tablet Take 1 tablet (5 mg total) by  mouth 2 (two) times daily. 09/10/18   Bedsole, Amy E, MD  aspirin EC 81 MG EC tablet Take 1 tablet (81 mg total) by mouth daily. 06/21/17   Caren Griffins, MD  DULoxetine (CYMBALTA) 30 MG capsule Take 2 capsules (60 mg total) by mouth daily. 09/10/18   Bedsole, Amy E, MD  ezetimibe (ZETIA) 10 MG tablet Take 1 tablet (10 mg total) by mouth daily. 09/10/18   Bedsole, Amy E, MD  glipiZIDE (GLUCOTROL) 10 MG tablet Take 1 tablet (10 mg total) by mouth daily before breakfast. 09/10/18   Bedsole, Amy E, MD  Insulin Pen Needle (B-D ULTRAFINE III SHORT PEN) 31G X 8 MM MISC Use to inject insulin once daily.  Dx: E11.65 06/22/17   Jinny Sanders, MD  LANTUS SOLOSTAR 100 UNIT/ML Solostar Pen INJECT 90 UNITS SUBCUTANEOUSLY ONCE DAILY 10/15/18   Bedsole, Amy E, MD  levothyroxine (SYNTHROID) 112 MCG tablet Take 1 tablet (112 mcg total) by mouth daily. 09/10/18   Bedsole, Amy E, MD  metFORMIN (GLUCOPHAGE) 500 MG tablet Take 2 tablets (1,000 mg total) by mouth 2 (two) times daily with a meal. 08/05/18   Bedsole, Amy E, MD  nystatin cream (MYCOSTATIN) Apply 1 application topically 2 (two) times daily. 09/10/18   Bedsole, Amy E, MD  pravastatin (PRAVACHOL) 20 MG tablet Take 1 tablet (20 mg total) by mouth daily. 09/10/18   Bedsole, Amy E, MD  trimethoprim (TRIMPEX) 100 MG tablet TAKE 1 TABLET BY MOUTH ONCE DAILY FOR UTI PROPHYLAXSIS 09/10/18   Bedsole, Amy E, MD  vitamin B-12 (CYANOCOBALAMIN) 1000 MCG tablet Take 1 tablet (1,000 mcg total) by mouth daily. 09/10/18   Bedsole, Amy E, MD  Vitamin D, Ergocalciferol, (DRISDOL) 1.25 MG (50000 UT) CAPS capsule Take 1 capsule (50,000 Units total) by mouth every 7 (seven) days. 09/10/18   Jinny Sanders, MD    Family History Family History  Problem Relation Age of Onset  . Lymphoma Father   . Hypertension Father   . Stroke Father   . Hyperlipidemia Father   . Hypertension Mother   . Allergies Brother   . Hypertension Sister        3  . Breast cancer Sister   . Fibromyalgia  Sister        3  . Hyperlipidemia Sister        3    Social History Social History   Tobacco Use  . Smoking status: Never Smoker  . Smokeless tobacco: Never Used  Substance Use Topics  . Alcohol use: No    Alcohol/week: 0.0 standard drinks  . Drug use: No     Allergies   Naproxen sodium, Statins, Sulfonamide derivatives, Latex, and Shellfish allergy   Review of Systems Review of Systems  Constitutional: Positive for fever.  Respiratory: Negative for cough.   Genitourinary: Positive for dysuria.       Foul-smelling urine  Psychiatric/Behavioral: Positive for confusion.  All other systems reviewed and are negative.    Physical Exam Updated Vital Signs BP (!) 142/76   Pulse 87   Temp (!) 100.8 F (  38.2 C) (Rectal)   Resp 20   SpO2 93%   Physical Exam  CONSTITUTIONAL: Elderly, no acute distress HEAD: Normocephalic/atraumatic EYES: EOMI ENMT: Mucous membranes dry, patient chewing smokeless tobacco NECK: supple no meningeal signs SPINE/BACK:entire spine nontender CV: Regular no loud murmurs LUNGS: Decreased breath sounds at the bases ABDOMEN: soft, nontender, no rebound or guarding, bowel sounds noted throughout abdomen GU:no cva tenderness NEURO: Pt is awake/alert/appropriate, moves all extremitiesx4.  No facial droop.   Patient is answer all questions appropriately, but is very hard of hearing which does limit history EXTREMITIES: pulses normal/equal, full ROM, no signs of trauma SKIN: warm, color normal PSYCH: no abnormalities of mood noted, alert and oriented to situation  ED Treatments / Results  Labs (all labs ordered are listed, but only abnormal results are displayed) Labs Reviewed  LACTIC ACID, PLASMA - Abnormal; Notable for the following components:      Result Value   Lactic Acid, Venous 2.0 (*)    All other components within normal limits  COMPREHENSIVE METABOLIC PANEL - Abnormal; Notable for the following components:   Chloride 96 (*)     Glucose, Bld 234 (*)    AST 52 (*)    All other components within normal limits  URINALYSIS, ROUTINE W REFLEX MICROSCOPIC - Abnormal; Notable for the following components:   APPearance HAZY (*)    Glucose, UA 50 (*)    Ketones, ur 5 (*)    Protein, ur 100 (*)    Nitrite POSITIVE (*)    Leukocytes,Ua TRACE (*)    WBC, UA >50 (*)    Bacteria, UA MANY (*)    All other components within normal limits  CBG MONITORING, ED - Abnormal; Notable for the following components:   Glucose-Capillary 227 (*)    All other components within normal limits  POC SARS CORONAVIRUS 2 AG -  ED - Abnormal; Notable for the following components:   SARS Coronavirus 2 Ag POSITIVE (*)    All other components within normal limits  CULTURE, BLOOD (ROUTINE X 2)  CULTURE, BLOOD (ROUTINE X 2)  URINE CULTURE  CBC WITH DIFFERENTIAL/PLATELET  APTT  PROTIME-INR  TSH    EKG EKG Interpretation  Date/Time:  Friday January 14 2019 22:55:37 EST Ventricular Rate:  90 PR Interval:    QRS Duration: 78 QT Interval:  361 QTC Calculation: 442 R Axis:   -89 Text Interpretation: Sinus rhythm Inferior infarct, old No significant change since last tracing Confirmed by Ripley Fraise [phone]) on 01/14/2019 11:43:11 PM   Radiology Dg Chest Port 1 View  Result Date: 01/14/2019 CLINICAL DATA:  Confused, possibly septic EXAM: PORTABLE CHEST 1 VIEW COMPARISON:  Radiograph 11/12/2014 FINDINGS: Streaky atelectatic changes. Attenuation in the left lung base likely corresponds to abundant pericardial fat seen on prior studies. No consolidation, features of edema, pneumothorax, or effusion. The aorta is calcified. The remaining cardiomediastinal contours are unremarkable. No acute osseous or soft tissue abnormality. IMPRESSION: 1. Atelectasis, no other acute cardiopulmonary disease. 2. Attenuation in the left lung base likely corresponds to pericardial fat, unchanged from multiple priors. 3.  Aortic Atherosclerosis (ICD10-I70.0).  Electronically Signed   By: Lovena Le M.D.   On: 01/14/2019 23:32    Procedures Procedures   Medications Ordered in ED Medications  cefTRIAXone (ROCEPHIN) 1 g in sodium chloride 0.9 % 100 mL IVPB (has no administration in time range)  remdesivir 200 mg in sodium chloride 0.9 % 250 mL IVPB (has no administration in time range)  remdesivir 100  mg in sodium chloride 0.9 % 250 mL IVPB (has no administration in time range)  sodium chloride 0.9 % bolus 1,000 mL (0 mLs Intravenous Stopped 01/15/19 0106)    And  sodium chloride 0.9 % bolus 1,000 mL (0 mLs Intravenous Stopped 01/15/19 0210)  acetaminophen (TYLENOL) suppository 650 mg (650 mg Rectal Given 01/15/19 0017)     Initial Impression / Assessment and Plan / ED Course  I have reviewed the triage vital signs and the nursing notes.  Pertinent labs & imaging results that were available during my care of the patient were reviewed by me and considered in my medical decision making (see chart for details).        11:41 PM Patient presented for altered mental status.  She is currently awake and alert but is hard of hearing.  She is febrile.  Attempted to call son at the numbers are listed but he did not answer Work-up is in process this time.  No indication for CT head at this time as patient is awake/alert 12:25 AM Son has arrived who confirms the story.  He reports she has had generalized weakness over the past day.  No falls or trauma has been reported He is suspicious for UTI 2:20 AM Patient is positive for COVID-19.  She also has a UTI.  She will be admitted to the hospital for IV antibiotics for UTI and monitor her respiratory status.  She is currently stable with pulse ox at 96% Discussed with Dr. Myna Hidalgo for admission Discussed with her son via phone to give an update.  He was advised to quarantine due to his exposure to [place] was evaluated in Emergency Department on 01/15/2019 for the symptoms described in  the history of present illness. She was evaluated in the context of the global COVID-19 pandemic, which necessitated consideration that the patient might be at risk for infection with the SARS-CoV-2 virus that causes COVID-19. Institutional protocols and algorithms that pertain to the evaluation of patients at risk for COVID-19 are in a state of rapid change based on information released by regulatory bodies including the CDC and federal and state organizations. These policies and algorithms were followed during the patient's care in the ED.  Final Clinical Impressions(s) / ED Diagnoses   Final diagnoses:  Acute cystitis without hematuria  COVID-19    ED Discharge Orders    None       Ripley Fraise, MD 01/15/19 0221

## 2019-01-14 NOTE — ED Triage Notes (Signed)
Pt BIB [facility] for eval of AMS since 1730 yesterday evening, son reports she is acting "differently". CAOx4 for EMS, EMS reports foul smelling urine. Pt is noted to be lethargic.

## 2019-01-15 ENCOUNTER — Other Ambulatory Visit: Payer: Self-pay

## 2019-01-15 ENCOUNTER — Encounter (HOSPITAL_COMMUNITY): Payer: Self-pay | Admitting: Family Medicine

## 2019-01-15 DIAGNOSIS — E038 Other specified hypothyroidism: Secondary | ICD-10-CM

## 2019-01-15 DIAGNOSIS — Z8673 Personal history of transient ischemic attack (TIA), and cerebral infarction without residual deficits: Secondary | ICD-10-CM | POA: Diagnosis not present

## 2019-01-15 DIAGNOSIS — Z91040 Latex allergy status: Secondary | ICD-10-CM | POA: Diagnosis not present

## 2019-01-15 DIAGNOSIS — D649 Anemia, unspecified: Secondary | ICD-10-CM | POA: Diagnosis present

## 2019-01-15 DIAGNOSIS — N390 Urinary tract infection, site not specified: Secondary | ICD-10-CM | POA: Diagnosis not present

## 2019-01-15 DIAGNOSIS — F329 Major depressive disorder, single episode, unspecified: Secondary | ICD-10-CM | POA: Diagnosis present

## 2019-01-15 DIAGNOSIS — E785 Hyperlipidemia, unspecified: Secondary | ICD-10-CM | POA: Diagnosis present

## 2019-01-15 DIAGNOSIS — Z794 Long term (current) use of insulin: Secondary | ICD-10-CM | POA: Diagnosis not present

## 2019-01-15 DIAGNOSIS — T380X5A Adverse effect of glucocorticoids and synthetic analogues, initial encounter: Secondary | ICD-10-CM | POA: Diagnosis present

## 2019-01-15 DIAGNOSIS — U071 COVID-19: Secondary | ICD-10-CM | POA: Diagnosis present

## 2019-01-15 DIAGNOSIS — Z882 Allergy status to sulfonamides status: Secondary | ICD-10-CM | POA: Diagnosis not present

## 2019-01-15 DIAGNOSIS — E1165 Type 2 diabetes mellitus with hyperglycemia: Secondary | ICD-10-CM | POA: Diagnosis present

## 2019-01-15 DIAGNOSIS — H9190 Unspecified hearing loss, unspecified ear: Secondary | ICD-10-CM | POA: Diagnosis present

## 2019-01-15 DIAGNOSIS — I480 Paroxysmal atrial fibrillation: Secondary | ICD-10-CM

## 2019-01-15 DIAGNOSIS — F419 Anxiety disorder, unspecified: Secondary | ICD-10-CM | POA: Diagnosis present

## 2019-01-15 DIAGNOSIS — G9341 Metabolic encephalopathy: Secondary | ICD-10-CM | POA: Diagnosis present

## 2019-01-15 DIAGNOSIS — E039 Hypothyroidism, unspecified: Secondary | ICD-10-CM | POA: Diagnosis present

## 2019-01-15 DIAGNOSIS — N3000 Acute cystitis without hematuria: Secondary | ICD-10-CM | POA: Diagnosis present

## 2019-01-15 DIAGNOSIS — J9601 Acute respiratory failure with hypoxia: Secondary | ICD-10-CM | POA: Diagnosis present

## 2019-01-15 DIAGNOSIS — E876 Hypokalemia: Secondary | ICD-10-CM | POA: Diagnosis present

## 2019-01-15 DIAGNOSIS — Z87440 Personal history of urinary (tract) infections: Secondary | ICD-10-CM | POA: Diagnosis not present

## 2019-01-15 DIAGNOSIS — I10 Essential (primary) hypertension: Secondary | ICD-10-CM | POA: Diagnosis present

## 2019-01-15 DIAGNOSIS — Z23 Encounter for immunization: Secondary | ICD-10-CM | POA: Diagnosis not present

## 2019-01-15 DIAGNOSIS — E1159 Type 2 diabetes mellitus with other circulatory complications: Secondary | ICD-10-CM

## 2019-01-15 DIAGNOSIS — B961 Klebsiella pneumoniae [K. pneumoniae] as the cause of diseases classified elsewhere: Secondary | ICD-10-CM | POA: Diagnosis present

## 2019-01-15 DIAGNOSIS — J1289 Other viral pneumonia: Secondary | ICD-10-CM | POA: Diagnosis present

## 2019-01-15 LAB — GLUCOSE, CAPILLARY
Glucose-Capillary: 503 mg/dL (ref 70–99)
Glucose-Capillary: 495 mg/dL — ABNORMAL HIGH (ref 70–99)
Glucose-Capillary: 332 mg/dL — ABNORMAL HIGH (ref 70–99)

## 2019-01-15 LAB — URINALYSIS, ROUTINE W REFLEX MICROSCOPIC
Bilirubin Urine: NEGATIVE
Glucose, UA: 50 mg/dL — AB
Hgb urine dipstick: NEGATIVE
Ketones, ur: 5 mg/dL — AB
Nitrite: POSITIVE — AB
Protein, ur: 100 mg/dL — AB
Specific Gravity, Urine: 1.02 (ref 1.005–1.030)
WBC, UA: >50 WBC/hpf — ABNORMAL HIGH (ref 0–5)
pH: 5 (ref 5.0–8.0)

## 2019-01-15 LAB — CBC WITH DIFFERENTIAL/PLATELET
Abs Immature Granulocytes: 0.02 10*3/uL (ref 0.00–0.07)
Basophils Absolute: 0 10*3/uL (ref 0.0–0.1)
Basophils Relative: 0 %
Eosinophils Absolute: 0 10*3/uL (ref 0.0–0.5)
Eosinophils Relative: 0 %
HCT: 36.6 % (ref 36.0–46.0)
Hemoglobin: 12.4 g/dL (ref 12.0–15.0)
Immature Granulocytes: 0 %
Lymphocytes Relative: 13 %
Lymphs Abs: 0.8 10*3/uL (ref 0.7–4.0)
MCH: 30.8 pg (ref 26.0–34.0)
MCHC: 33.9 g/dL (ref 30.0–36.0)
MCV: 90.8 fL (ref 80.0–100.0)
Monocytes Absolute: 0.8 10*3/uL (ref 0.1–1.0)
Monocytes Relative: 12 %
Neutro Abs: 4.5 10*3/uL (ref 1.7–7.7)
Neutrophils Relative %: 75 %
Platelets: 185 10*3/uL (ref 150–400)
RBC: 4.03 MIL/uL (ref 3.87–5.11)
RDW: 13.5 % (ref 11.5–15.5)
WBC: 6.1 10*3/uL (ref 4.0–10.5)
nRBC: 0 % (ref 0.0–0.2)

## 2019-01-15 LAB — COMPREHENSIVE METABOLIC PANEL
ALT: 40 U/L (ref 0–44)
AST: 52 U/L — ABNORMAL HIGH (ref 15–41)
Albumin: 3.7 g/dL (ref 3.5–5.0)
Alkaline Phosphatase: 77 U/L (ref 38–126)
Anion gap: 14 (ref 5–15)
BUN: 8 mg/dL (ref 8–23)
CO2: 25 mmol/L (ref 22–32)
Calcium: 9.3 mg/dL (ref 8.9–10.3)
Chloride: 96 mmol/L — ABNORMAL LOW (ref 98–111)
Creatinine, Ser: 0.84 mg/dL (ref 0.44–1.00)
GFR calc Af Amer: >60 mL/min (ref >60)
GFR calc non Af Amer: >60 mL/min (ref >60)
Glucose, Bld: 234 mg/dL — ABNORMAL HIGH (ref 70–99)
Potassium: 3.7 mmol/L (ref 3.5–5.1)
Sodium: 135 mmol/L (ref 135–145)
Total Bilirubin: 0.9 mg/dL (ref 0.3–1.2)
Total Protein: 7.1 g/dL (ref 6.5–8.1)

## 2019-01-15 LAB — PROTIME-INR
INR: 1.2 (ref 0.8–1.2)
Prothrombin Time: 14.8 seconds (ref 11.4–15.2)

## 2019-01-15 LAB — HEMOGLOBIN A1C
Hgb A1c MFr Bld: 11.2 % — ABNORMAL HIGH (ref 4.8–5.6)
Mean Plasma Glucose: 274.74 mg/dL

## 2019-01-15 LAB — APTT: aPTT: 33 seconds (ref 24–36)

## 2019-01-15 LAB — TSH: TSH: 1.441 u[IU]/mL (ref 0.350–4.500)

## 2019-01-15 LAB — POC SARS CORONAVIRUS 2 AG -  ED: SARS Coronavirus 2 Ag: POSITIVE — AB

## 2019-01-15 LAB — LACTIC ACID, PLASMA: Lactic Acid, Venous: 2 mmol/L (ref 0.5–1.9)

## 2019-01-15 LAB — ABO/RH: ABO/RH(D): O POS

## 2019-01-15 LAB — CBG MONITORING, ED: Glucose-Capillary: 254 mg/dL — ABNORMAL HIGH (ref 70–99)

## 2019-01-15 MED ORDER — INSULIN ASPART 100 UNIT/ML ~~LOC~~ SOLN
10.0000 [IU] | Freq: Once | SUBCUTANEOUS | Status: AC
  Administered 2019-01-15: 10 [IU] via SUBCUTANEOUS

## 2019-01-15 MED ORDER — SODIUM CHLORIDE 0.9 % IV SOLN
1.0000 g | Freq: Once | INTRAVENOUS | Status: AC
  Administered 2019-01-15: 1 g via INTRAVENOUS
  Filled 2019-01-15: qty 10

## 2019-01-15 MED ORDER — INSULIN ASPART 100 UNIT/ML ~~LOC~~ SOLN
0.0000 [IU] | Freq: Three times a day (TID) | SUBCUTANEOUS | Status: DC
  Administered 2019-01-15: 8 [IU] via SUBCUTANEOUS
  Administered 2019-01-15: 15 [IU] via SUBCUTANEOUS
  Administered 2019-01-16: 5 [IU] via SUBCUTANEOUS
  Administered 2019-01-16: 11 [IU] via SUBCUTANEOUS

## 2019-01-15 MED ORDER — SODIUM CHLORIDE 0.9 % IV SOLN
100.0000 mg | INTRAVENOUS | Status: AC
  Administered 2019-01-16 – 2019-01-19 (×4): 100 mg via INTRAVENOUS
  Filled 2019-01-15 (×4): qty 100

## 2019-01-15 MED ORDER — SODIUM CHLORIDE 0.9 % IV SOLN
200.0000 mg | Freq: Once | INTRAVENOUS | Status: AC
  Administered 2019-01-15: 200 mg via INTRAVENOUS
  Filled 2019-01-15: qty 40

## 2019-01-15 MED ORDER — INSULIN ASPART 100 UNIT/ML ~~LOC~~ SOLN
0.0000 [IU] | Freq: Every day | SUBCUTANEOUS | Status: DC
  Administered 2019-01-15: 4 [IU] via SUBCUTANEOUS

## 2019-01-15 MED ORDER — SODIUM CHLORIDE 0.9 % IV SOLN
1.0000 g | INTRAVENOUS | Status: AC
  Administered 2019-01-16 – 2019-01-18 (×4): 1 g via INTRAVENOUS
  Filled 2019-01-15 (×4): qty 10

## 2019-01-15 MED ORDER — PRAVASTATIN SODIUM 10 MG PO TABS
20.0000 mg | ORAL_TABLET | Freq: Every day | ORAL | Status: DC
  Administered 2019-01-15 – 2019-01-20 (×6): 20 mg via ORAL
  Filled 2019-01-15 (×6): qty 2

## 2019-01-15 MED ORDER — LEVOTHYROXINE SODIUM 112 MCG PO TABS
112.0000 ug | ORAL_TABLET | Freq: Every day | ORAL | Status: DC
  Administered 2019-01-15 – 2019-01-20 (×6): 112 ug via ORAL
  Filled 2019-01-15 (×6): qty 1

## 2019-01-15 MED ORDER — ONDANSETRON HCL 4 MG/2ML IJ SOLN: 4.0000 mg | Freq: Four times a day (QID) | INTRAMUSCULAR | Status: DC | PRN

## 2019-01-15 MED ORDER — INSULIN GLARGINE 100 UNIT/ML ~~LOC~~ SOLN
35.0000 [IU] | Freq: Two times a day (BID) | SUBCUTANEOUS | Status: DC
  Administered 2019-01-15 – 2019-01-16 (×2): 35 [IU] via SUBCUTANEOUS
  Filled 2019-01-15 (×3): qty 0.35

## 2019-01-15 MED ORDER — APIXABAN 5 MG PO TABS
5.0000 mg | ORAL_TABLET | Freq: Two times a day (BID) | ORAL | Status: DC
  Administered 2019-01-15 – 2019-01-20 (×11): 5 mg via ORAL
  Filled 2019-01-15 (×13): qty 1

## 2019-01-15 MED ORDER — DULOXETINE HCL 60 MG PO CPEP
60.0000 mg | ORAL_CAPSULE | Freq: Every day | ORAL | Status: DC
  Administered 2019-01-15 – 2019-01-20 (×6): 60 mg via ORAL
  Filled 2019-01-15 (×6): qty 1

## 2019-01-15 MED ORDER — ACETAMINOPHEN 325 MG PO TABS
650.0000 mg | ORAL_TABLET | Freq: Four times a day (QID) | ORAL | Status: DC | PRN
  Administered 2019-01-16 – 2019-01-17 (×2): 650 mg via ORAL
  Filled 2019-01-15 (×2): qty 2

## 2019-01-15 MED ORDER — INSULIN GLARGINE 100 UNIT/ML ~~LOC~~ SOLN
50.0000 [IU] | Freq: Every day | SUBCUTANEOUS | Status: DC
  Filled 2019-01-15: qty 0.5

## 2019-01-15 MED ORDER — ONDANSETRON HCL 4 MG PO TABS: 4.0000 mg | ORAL_TABLET | Freq: Four times a day (QID) | ORAL | Status: DC | PRN

## 2019-01-15 MED ORDER — DEXAMETHASONE SODIUM PHOSPHATE 10 MG/ML IJ SOLN
6.0000 mg | INTRAMUSCULAR | Status: DC
  Administered 2019-01-15 – 2019-01-19 (×4): 6 mg via INTRAVENOUS
  Filled 2019-01-15 (×4): qty 1

## 2019-01-15 MED ORDER — ALBUTEROL SULFATE HFA 108 (90 BASE) MCG/ACT IN AERS
2.0000 | INHALATION_SPRAY | RESPIRATORY_TRACT | Status: DC | PRN
  Filled 2019-01-15: qty 6.7

## 2019-01-15 MED ORDER — INSULIN ASPART 100 UNIT/ML ~~LOC~~ SOLN
4.0000 [IU] | Freq: Three times a day (TID) | SUBCUTANEOUS | Status: DC
  Administered 2019-01-15 – 2019-01-19 (×10): 4 [IU] via SUBCUTANEOUS

## 2019-01-15 MED ORDER — ASPIRIN EC 81 MG PO TBEC
81.0000 mg | DELAYED_RELEASE_TABLET | Freq: Every day | ORAL | Status: DC
  Administered 2019-01-15 – 2019-01-20 (×6): 81 mg via ORAL
  Filled 2019-01-15 (×6): qty 1

## 2019-01-15 NOTE — Progress Notes (Signed)
Patient seen and examined at bedside, patient admitted after midnight, please see earlier detailed admission note by Vianne Bulls, MD. Briefly, patient presented secondary to confusion, lethargy and found to be COVID+ in addition to having concerns for a UTI. Patient started on Decadron, Remdesivir, Ceftriaxone. Mental status improving.  COVID-19 virus infection No evidence of pneumonia on chest x-ray. Associated hypoxia. Started on supplemental oxygen in addition to Decadron and Remdesivir. -Continue Decadron 6 mg IV daily and Remdesivir x5 days; if mental status continues to be stable, can switch to PO -Daily CRP, CBC, CMP, D-dimer, Ferritin, Magnesium, Phosphorus -Continuous pulse oximetry  UTI -Continue Ceftriaxone; await culture data  Diabetes mellitus, type 2 -Continue Lantus, SSI and meal coverage. Lantus decreased to 50 units on admission from 90 units as an outpatient -CBG qAC and qHS  Paroxysmal atrial fibrillation -Continue Eliquis  Hypothyroidism -Continue Synthroid  History of CVA Hyperlipidemia -Continue Pravastatin, aspirin  Anxiety/Depression -Restart home Cymbalta 60 mg (high dose for age)  Patient to be transferring to [facility]. Discussed care with son on telephone. All questions answered. Son aware of patient transfer to [facility].   Cordelia Poche, MD [facility] 01/15/2019, 10:54 AM

## 2019-01-15 NOTE — ED Notes (Signed)
Report called to Nila Nephew, RN at [facility]

## 2019-01-15 NOTE — ED Notes (Signed)
MS   Breakfast ordered  

## 2019-01-15 NOTE — H&P (Signed)
History and Physical    Jaclyn [name] [id number] DOB: 06-Mar-1935 DOA: 01/14/2019  PCP: Jinny Sanders, MD   Patient coming from: Home   Chief Complaint: Lethargy, confusion, foul-smelling urine   HPI: Jaclyn [name] is a 83 y.o. female with medical history significant for paroxysmal atrial fibrillation on Eliquis, history of CVA, hypertension, poorly controlled insulin-dependent diabetes mellitus, recurrent UTIs, and hypothyroidism, now presenting to the emergency department with 2 days of lethargy, confusion, and malodorous urine.  Patient is brought in by her son who is concerned for recurrent UTI, noting that she has developed some more fatigue and confusion in the setting of a UTI previously.  Patient reports fatigue, generalized aches and pains, and reports that she has been unable to ambulate about her house today due to both fatigue and severe aches.  She denies focal numbness or weakness and denies chest pain.  She has had intermittent pain in the right flank recently.  Denies abdominal pain.  Her son was concern for waxing and waning confusion over the past 2 days, particularly in the evenings, and also notes that the patient has been more lethargic.  ED Course: Upon arrival to the ED, patient is found to be febrile to 38.2 C, saturating 88% on room air, tachypneic, and with stable blood pressure.  EKG features a sinus rhythm and chest x-ray is notable for atelectasis.  Chemistry panel notable for serum glucose of 234 and slight elevation in AST.  CBC is unremarkable.  Lactic acid is 2.0.  Urinalysis is compatible with possible infection.  COVID-19 antigen test is positive.  Blood and urine cultures were collected in the emergency department and the patient was treated with 2 L normal saline, acetaminophen, and Rocephin.  Review of Systems:  All other systems reviewed and apart from HPI, are negative.  Past Medical History:  Diagnosis Date  . Atrial fibrillation ([place])   . Benign  neoplasm of colon   . Carotid artery occlusion    60-79% right ICA stenosis  . Carotid bruit   . Cerebrovascular disease, unspecified   . Difficult intubation 2008   During surgery to remove large polyp  . Diverticulosis of colon (without mention of hemorrhage)   . Dysthymic disorder   . Fibromyalgia   . Headache(784.0)   . Insomnia, unspecified   . Interstitial cystitis   . Myalgia and myositis, unspecified   . Osteoarthrosis, unspecified whether generalized or localized, unspecified site   . Other and unspecified hyperlipidemia   . Other chest pain   . Other specified benign mammary dysplasias   . TIA (transient ischemic attack)   . Type II or unspecified type diabetes mellitus without mention of complication, not stated as uncontrolled   . Unspecified essential hypertension   . Unspecified hypothyroidism   . Unspecified vitamin D deficiency   . Urinary tract infection, site not specified     Past Surgical History:  Procedure Laterality Date  . ABDOMINAL HYSTERECTOMY  1988   cervical dysplasia  . CARDIAC CATHETERIZATION  02/19/06   EF 60%  . COLONOSCOPY    . RIGHT COLECTOMY  2005   for villous adenoma of the cecum Dr.streck  . VESICOVAGINAL FISTULA CLOSURE W/ TAH  1990   w/ cystocele &retocele repairs Dr. Ree Edman     reports that she has never smoked. She has never used smokeless tobacco. She reports that she does not drink alcohol or use drugs.  Allergies  Allergen Reactions  . Naproxen Sodium Other (See Comments)  Fever/aches and pains  . Statins     myalgias  . Sulfonamide Derivatives Hives and Itching  . Latex Itching and Rash  . Shellfish Allergy Itching, Swelling and Rash    Seafood, shrimp    Family History  Problem Relation Age of Onset  . Lymphoma Father   . Hypertension Father   . Stroke Father   . Hyperlipidemia Father   . Hypertension Mother   . Allergies Brother   . Hypertension Sister        3  . Breast cancer Sister   . Fibromyalgia  Sister        3  . Hyperlipidemia Sister        3     Prior to Admission medications   Medication Sig Start Date End Date Taking? Authorizing Provider  apixaban (ELIQUIS) 5 MG TABS tablet Take 1 tablet (5 mg total) by mouth 2 (two) times daily. 09/10/18   Bedsole, Amy E, MD  aspirin EC 81 MG EC tablet Take 1 tablet (81 mg total) by mouth daily. 06/21/17   Caren Griffins, MD  DULoxetine (CYMBALTA) 30 MG capsule Take 2 capsules (60 mg total) by mouth daily. 09/10/18   Bedsole, Amy E, MD  ezetimibe (ZETIA) 10 MG tablet Take 1 tablet (10 mg total) by mouth daily. 09/10/18   Bedsole, Amy E, MD  glipiZIDE (GLUCOTROL) 10 MG tablet Take 1 tablet (10 mg total) by mouth daily before breakfast. 09/10/18   Bedsole, Amy E, MD  Insulin Pen Needle (B-D ULTRAFINE III SHORT PEN) 31G X 8 MM MISC Use to inject insulin once daily.  Dx: E11.65 06/22/17   Jinny Sanders, MD  LANTUS SOLOSTAR 100 UNIT/ML Solostar Pen INJECT 90 UNITS SUBCUTANEOUSLY ONCE DAILY 10/15/18   Bedsole, Amy E, MD  levothyroxine (SYNTHROID) 112 MCG tablet Take 1 tablet (112 mcg total) by mouth daily. 09/10/18   Bedsole, Amy E, MD  metFORMIN (GLUCOPHAGE) 500 MG tablet Take 2 tablets (1,000 mg total) by mouth 2 (two) times daily with a meal. 08/05/18   Bedsole, Amy E, MD  nystatin cream (MYCOSTATIN) Apply 1 application topically 2 (two) times daily. 09/10/18   Bedsole, Amy E, MD  pravastatin (PRAVACHOL) 20 MG tablet Take 1 tablet (20 mg total) by mouth daily. 09/10/18   Bedsole, Amy E, MD  trimethoprim (TRIMPEX) 100 MG tablet TAKE 1 TABLET BY MOUTH ONCE DAILY FOR UTI PROPHYLAXSIS 09/10/18   Bedsole, Amy E, MD  vitamin B-12 (CYANOCOBALAMIN) 1000 MCG tablet Take 1 tablet (1,000 mcg total) by mouth daily. 09/10/18   Bedsole, Amy E, MD  Vitamin D, Ergocalciferol, (DRISDOL) 1.25 MG (50000 UT) CAPS capsule Take 1 capsule (50,000 Units total) by mouth every 7 (seven) days. 09/10/18   Jinny Sanders, MD    Physical Exam: Vitals:   01/15/19 0045 01/15/19 0048  01/15/19 0100 01/15/19 0115  BP: (!) 144/71  (!) 149/61 (!) 147/68  Pulse: 87 86    Resp: 10 18 (!) 22 17  Temp:      TempSrc:      SpO2:  96%    Weight:      Height:        Constitutional: NAD, calm  Eyes: PERTLA, lids and conjunctivae normal ENMT: Mucous membranes are moist. Posterior pharynx clear of any exudate or lesions.   Neck: normal, supple, no masses, no thyromegaly Respiratory: Tachypnea while at rest in bed. Speaking full sentences. No pallor or cyanosis.  Cardiovascular: S1 & S2 heard, regular rate  and rhythm. No extremity edema.    Abdomen: No distension, no tenderness, soft. Bowel sounds active.  Musculoskeletal: no clubbing / cyanosis. No joint deformity upper and lower extremities.  Skin: no significant rashes, lesions, ulcers. Warm, dry, well-perfused. Neurologic: No gross facial asymmetry. Gross hearing deficit. Sensation to light touch intact. Strength 5/5 in all 4 limbs.  Psychiatric: Alert and oriented to person, place, and situation. Calm, cooperative.      Labs on Admission: I have personally reviewed following labs and imaging studies  CBC: Recent Labs  Lab 01/14/19 2348  WBC 6.1  NEUTROABS 4.5  HGB 12.4  HCT 36.6  MCV 90.8  PLT 123XX123   Basic Metabolic Panel: Recent Labs  Lab 01/14/19 2348  NA 135  K 3.7  CL 96*  CO2 25  GLUCOSE 234*  BUN 8  CREATININE 0.84  CALCIUM 9.3   GFR: Estimated Creatinine Clearance: 49.8 mL/min (by C-G formula based on SCr of 0.84 mg/dL). Liver Function Tests: Recent Labs  Lab 01/14/19 2348  AST 52*  ALT 40  ALKPHOS 77  BILITOT 0.9  PROT 7.1  ALBUMIN 3.7   No results for input(s): LIPASE, AMYLASE in the last 168 hours. No results for input(s): AMMONIA in the last 168 hours. Coagulation Profile: Recent Labs  Lab 01/14/19 2348  INR 1.2   Cardiac Enzymes: No results for input(s): CKTOTAL, CKMB, CKMBINDEX, TROPONINI in the last 168 hours. BNP (last 3 results) No results for input(s): PROBNP in the  last 8760 hours. HbA1C: No results for input(s): HGBA1C in the last 72 hours. CBG: Recent Labs  Lab 01/14/19 2316  GLUCAP 227*   Lipid Profile: No results for input(s): CHOL, HDL, LDLCALC, TRIG, CHOLHDL, LDLDIRECT in the last 72 hours. Thyroid Function Tests: No results for input(s): TSH, T4TOTAL, FREET4, T3FREE, THYROIDAB in the last 72 hours. Anemia Panel: No results for input(s): VITAMINB12, FOLATE, FERRITIN, TIBC, IRON, RETICCTPCT in the last 72 hours. Urine analysis:    Component Value Date/Time   COLORURINE YELLOW 01/14/2019 2348   APPEARANCEUR HAZY (A) 01/14/2019 2348   LABSPEC 1.020 01/14/2019 2348   PHURINE 5.0 01/14/2019 2348   GLUCOSEU 50 (A) 01/14/2019 2348   GLUCOSEU NEGATIVE 10/08/2012 1148   HGBUR NEGATIVE 01/14/2019 2348   BILIRUBINUR NEGATIVE 01/14/2019 2348   BILIRUBINUR negative 12/18/2017 1613   KETONESUR 5 (A) 01/14/2019 2348   PROTEINUR 100 (A) 01/14/2019 2348   UROBILINOGEN 0.2 12/18/2017 1613   UROBILINOGEN 0.2 11/01/2014 2113   NITRITE POSITIVE (A) 01/14/2019 2348   LEUKOCYTESUR TRACE (A) 01/14/2019 2348   Sepsis Labs: @LABRCNTIP (procalcitonin:4,lacticidven:4) )No results found for this or any previous visit (from the past 240 hour(s)).   Radiological Exams on Admission: Dg Chest Port 1 View  Result Date: 01/14/2019 CLINICAL DATA:  Confused, possibly septic EXAM: PORTABLE CHEST 1 VIEW COMPARISON:  Radiograph 11/12/2014 FINDINGS: Streaky atelectatic changes. Attenuation in the left lung base likely corresponds to abundant pericardial fat seen on prior studies. No consolidation, features of edema, pneumothorax, or effusion. The aorta is calcified. The remaining cardiomediastinal contours are unremarkable. No acute osseous or soft tissue abnormality. IMPRESSION: 1. Atelectasis, no other acute cardiopulmonary disease. 2. Attenuation in the left lung base likely corresponds to pericardial fat, unchanged from multiple priors. 3.  Aortic Atherosclerosis  (ICD10-I70.0). Electronically Signed   By: Lovena Le M.D.   On: 01/14/2019 23:32    EKG: Independently reviewed. Sinus rhythm, rate 90, QTc 442 ms.   Assessment/Plan   1. COVID-19 infection; acute hypoxic respiratory failure  -  Presents with lethargy and confusion, is noted to be febrile with tachypnea and O2 saturation in 80's on rm air, positive COVID antigen test  - Start Decadron and remdesivir, continue supplemental O2 as needed, continue isolation, supportive care, trend inflammatory markers    2. UTI  - Patient has hx of recurrent UTI's previously on suppressive trimethoprim, now brought in by her son who is concerned for recurrent UTI based on lethargy, confusion, and malodorous urine  - UA is compatible with infection, blood and urine cultures were collected in the ED, and empiric Rocephin was started  - Continue Rocephin while following cultures and clinical response to treatment      3. Insulin-dependent DM  - A1c was 12.7% in July 2020  - Continue Lantus and use mealtime and correctional Novolog while in hospital    4. History of CVA  - Continue ASA and statin   5. Hypothyroidism  - Check TSH given her lethargy, continue Synthroid    6. Paroxysmal atrial fibrillation  - In sinus rhythm on admission  - CHADS-VASc is 39 (age x2, CVA x2, HTN, DM)  - Continue Eliquis    7. Acute encephalopathy  - Patient presents with 2 days of lethargy with some confusion which led her son to suspect that she has a recurrent UTI  - Likely secondary to the acute infection; extend workup if she fails to improve as expected with continued antibiotics, antipyretics, IVF hydration, and supportive care   PPE: CAPR, gown, gloves  DVT prophylaxis: Eliquis  Code Status: Full  Family Communication: Son updated by phone  Consults called: None Admission status: Inpatient. Patient has COVID-19 with new supplemental O2 requirement as well as acute UTI complicated by acute encephalopathy and it  is not reasonably expected that her condition can be stabilized and improved enough for safe discharge within observation timeframe, particularly in light of her age and comorbidity.    Vianne Bulls, MD [facility] Pager [phone]  If 7PM-7AM, please contact night-coverage [email or web address] Password [facility]  01/15/2019, 1:43 AM

## 2019-01-15 NOTE — ED Notes (Signed)
Son, Jaclyn [name]. Would like to be called for updates [phone].

## 2019-01-15 NOTE — ED Notes (Signed)
Lunch Tray Ordered @ 1029. 

## 2019-01-15 NOTE — Sepsis Progress Note (Signed)
Notified bedside nurse of need to draw repeat lactic acid. 

## 2019-01-15 NOTE — ED Notes (Signed)
[facility] contacted, pt on schedule for transport to [facility]

## 2019-01-16 LAB — CBC
HCT: 34.4 % — ABNORMAL LOW (ref 36.0–46.0)
Hemoglobin: 11.5 g/dL — ABNORMAL LOW (ref 12.0–15.0)
MCH: 30.7 pg (ref 26.0–34.0)
MCHC: 33.4 g/dL (ref 30.0–36.0)
MCV: 91.7 fL (ref 80.0–100.0)
Platelets: 215 10*3/uL (ref 150–400)
RBC: 3.75 MIL/uL — ABNORMAL LOW (ref 3.87–5.11)
RDW: 13.5 % (ref 11.5–15.5)
WBC: 7.4 10*3/uL (ref 4.0–10.5)
nRBC: 0 % (ref 0.0–0.2)

## 2019-01-16 LAB — COMPREHENSIVE METABOLIC PANEL
ALT: 35 U/L (ref 0–44)
AST: 35 U/L (ref 15–41)
Albumin: 3.4 g/dL — ABNORMAL LOW (ref 3.5–5.0)
Alkaline Phosphatase: 63 U/L (ref 38–126)
Anion gap: 11 (ref 5–15)
BUN: 17 mg/dL (ref 8–23)
CO2: 20 mmol/L — ABNORMAL LOW (ref 22–32)
Calcium: 8.9 mg/dL (ref 8.9–10.3)
Chloride: 104 mmol/L (ref 98–111)
Creatinine, Ser: 0.61 mg/dL (ref 0.44–1.00)
GFR calc Af Amer: >60 mL/min (ref >60)
GFR calc non Af Amer: >60 mL/min (ref >60)
Glucose, Bld: 157 mg/dL — ABNORMAL HIGH (ref 70–99)
Potassium: 3.4 mmol/L — ABNORMAL LOW (ref 3.5–5.1)
Sodium: 135 mmol/L (ref 135–145)
Total Bilirubin: 0.6 mg/dL (ref 0.3–1.2)
Total Protein: 6.8 g/dL (ref 6.5–8.1)

## 2019-01-16 LAB — GLUCOSE, CAPILLARY
Glucose-Capillary: 219 mg/dL — ABNORMAL HIGH (ref 70–99)
Glucose-Capillary: 305 mg/dL — ABNORMAL HIGH (ref 70–99)
Glucose-Capillary: 321 mg/dL — ABNORMAL HIGH (ref 70–99)
Glucose-Capillary: 182 mg/dL — ABNORMAL HIGH (ref 70–99)

## 2019-01-16 LAB — D-DIMER, QUANTITATIVE: D-Dimer, Quant: 0.34 ug/mL-FEU (ref 0.00–0.50)

## 2019-01-16 LAB — C-REACTIVE PROTEIN: CRP: 1.2 mg/dL — ABNORMAL HIGH (ref <1.0)

## 2019-01-16 LAB — MAGNESIUM: Magnesium: 1.4 mg/dL — ABNORMAL LOW (ref 1.7–2.4)

## 2019-01-16 LAB — PHOSPHORUS: Phosphorus: 3.5 mg/dL (ref 2.5–4.6)

## 2019-01-16 MED ORDER — HALOPERIDOL LACTATE 5 MG/ML IJ SOLN
2.0000 mg | Freq: Four times a day (QID) | INTRAMUSCULAR | Status: DC | PRN
  Administered 2019-01-16: 2 mg via INTRAVENOUS
  Filled 2019-01-16 (×2): qty 1

## 2019-01-16 MED ORDER — POTASSIUM CHLORIDE CRYS ER 20 MEQ PO TBCR
20.0000 meq | EXTENDED_RELEASE_TABLET | Freq: Once | ORAL | Status: AC
  Administered 2019-01-16: 20 meq via ORAL
  Filled 2019-01-16: qty 1

## 2019-01-16 MED ORDER — INSULIN ASPART 100 UNIT/ML ~~LOC~~ SOLN
0.0000 [IU] | Freq: Three times a day (TID) | SUBCUTANEOUS | Status: DC
  Administered 2019-01-16: 15 [IU] via SUBCUTANEOUS
  Administered 2019-01-17: 3 [IU] via SUBCUTANEOUS
  Administered 2019-01-17: 7 [IU] via SUBCUTANEOUS
  Administered 2019-01-18: 11 [IU] via SUBCUTANEOUS
  Administered 2019-01-18 – 2019-01-19 (×2): 20 [IU] via SUBCUTANEOUS
  Administered 2019-01-19: 3 [IU] via SUBCUTANEOUS

## 2019-01-16 MED ORDER — LORAZEPAM 2 MG/ML IJ SOLN
1.0000 mg | Freq: Four times a day (QID) | INTRAMUSCULAR | Status: DC | PRN
  Administered 2019-01-16 – 2019-01-18 (×2): 1 mg via INTRAVENOUS
  Filled 2019-01-16 (×2): qty 1

## 2019-01-16 MED ORDER — INSULIN GLARGINE 100 UNIT/ML ~~LOC~~ SOLN
40.0000 [IU] | Freq: Two times a day (BID) | SUBCUTANEOUS | Status: DC
  Administered 2019-01-16 – 2019-01-17 (×2): 40 [IU] via SUBCUTANEOUS
  Filled 2019-01-16 (×3): qty 0.4

## 2019-01-16 MED ORDER — INSULIN ASPART 100 UNIT/ML ~~LOC~~ SOLN
0.0000 [IU] | Freq: Every day | SUBCUTANEOUS | Status: DC
  Administered 2019-01-17: 4 [IU] via SUBCUTANEOUS
  Administered 2019-01-18: 2 [IU] via SUBCUTANEOUS

## 2019-01-16 MED ORDER — MAGNESIUM SULFATE 2 GM/50ML IV SOLN
2.0000 g | Freq: Once | INTRAVENOUS | Status: AC
  Administered 2019-01-16: 2 g via INTRAVENOUS
  Filled 2019-01-16: qty 50

## 2019-01-16 NOTE — Progress Notes (Signed)
PROGRESS NOTE  Jaclyn [name]  [id number] DOB: 11-03-1935 DOA: 01/14/2019 PCP: Jinny Sanders, MD   Brief Narrative: Jaclyn [name] is a 83 y.o. female with medical history significant for paroxysmal atrial fibrillation on Eliquis, history of CVA, hypertension, poorly controlled insulin-dependent diabetes mellitus, recurrent UTIs, and hypothyroidism, now presenting to the emergency department with 2 days of lethargy, confusion, and malodorous urine.  Patient is brought in by her son who is concerned for recurrent UTI, noting that she has developed some more fatigue and confusion in the setting of a UTI previously.  Patient reports fatigue, generalized aches and pains, and reports that she has been unable to ambulate about her house today due to both fatigue and severe aches.  She denies focal numbness or weakness and denies chest pain.  She has had intermittent pain in the right flank recently.  Denies abdominal pain.  Her son was concern for waxing and waning confusion over the past 2 days, particularly in the evenings, and also notes that the patient has been more lethargic.  ED Course: Upon arrival to the ED, patient is found to be febrile to 38.2 C, saturating 88% on room air, tachypneic, and with stable blood pressure.  EKG features a sinus rhythm and chest x-ray is notable for atelectasis.  Chemistry panel notable for serum glucose of 234 and slight elevation in AST.  CBC is unremarkable.  Lactic acid is 2.0.  Urinalysis is compatible with possible infection.  COVID-19 antigen test is positive.  Blood and urine cultures were collected in the emergency department and the patient was treated with 2 L normal saline, acetaminophen, and Rocephin.  Assessment & Plan: Principal Problem:   COVID-19 virus infection Active Problems:   Hypothyroidism   PAROXYSMAL ATRIAL FIBRILLATION   History of CVA (cerebrovascular accident)   Type 2 diabetes mellitus with vascular disease ([place])   Acute lower  UTI  Acute hypoxic respiratory failure due to covid-19 virus infection: No CXR infiltrate. Has been weaned to room air.  - Complete 5 days remdesivir, continue steroids.  - Trend inflammatory markers, monitor ALT.   Acute metabolic encephalopathy: Due to covid-19 and UTI in addition to acute delirium of which the patient has a history.  - Delirium precautions - Continue home medications, but avoid polypharmacy - Continue covid and UTI Tx's  GNR UTI:  - Continue CTX pending urine culture. Blood cultures NGTD.  T2DM, porrly controlled with hyperglycemia: HbA1c 11.2%.  - continue lantus, Changed from 90u once daily to 35u BID, will increase to 40u BID  - Augment to resistant SSI and continue mealtime insulin. Continue HS coverage.   Hypokalemia:  - Supplement and monitor  Hypomagnesemia:  - Supplement and monitor  Paroxysmal atrial fibrillation - Continue eliquis  Hypothyroidism: TSH 1.441 - Continue synthroid  History of CVA, right ICA stenosis: - Continue ASA in addition to eliquis, statin  Hyperlipidemia -Continue pravastatin (tolerating this)  Anxiety/Depression - Restart home cymbalta 60 mg (high dose for age)  DVT prophylaxis: Eliquis Code Status: Full Family Communication: Called Son without answer this afternoon. Disposition Plan: Uncertain, pending clinical progress  Consultants:   None  Procedures:   None  Antimicrobials:  Ceftriaxone 11/28 >>   Remdesivir 11/28 - 12/2  Subjective: Confused but conversant and jovial. Ate breakfast but spilled a lot on her gown. She denies shortness of breath, chest pain, and dysuria.   Objective: Vitals:   01/16/19 0755 01/16/19 0756 01/16/19 0757 01/16/19 0809  BP:   (!) 106/91 118/64  Pulse:    71  Resp: 18 17 (!) 21 18  Temp:    97.7 F (36.5 C)  TempSrc:    Oral  SpO2:    93%  Weight:      Height:        Intake/Output Summary (Last 24 hours) at 01/16/2019 1358 Last data filed at 01/16/2019  1200 Gross per 24 hour  Intake 1070 ml  Output 300 ml  Net 770 ml   Filed Weights   01/14/19 2351  Weight: 68 kg    Gen: Pleasant elderly female in no distress Pulm: Non-labored breathing. Clear to auscultation bilaterally.  CV: Regular rate and rhythm. No murmur, rub, or gallop. No JVD, no significant pedal edema. GI: Abdomen soft, non-tender, non-distended, with normoactive bowel sounds. No organomegaly or masses felt. Ext: Warm, no deformities Skin: No rashes, lesions or ulcers on visualized skin Neuro: Alert and disoriented. No focal neurological deficits. Psych: Judgement and insight appear impaired  Data Reviewed: I have personally reviewed following labs and imaging studies  CBC: Recent Labs  Lab 01/14/19 2348 01/16/19 0421  WBC 6.1 7.4  NEUTROABS 4.5  --   HGB 12.4 11.5*  HCT 36.6 34.4*  MCV 90.8 91.7  PLT 185 [id number]   Basic Metabolic Panel: Recent Labs  Lab 01/14/19 2348 01/16/19 0421  NA 135 135  K 3.7 3.4*  CL 96* 104  CO2 25 20*  GLUCOSE 234* 157*  BUN 8 17  CREATININE 0.84 0.61  CALCIUM 9.3 8.9  MG  --  1.4*  PHOS  --  3.5   GFR: Estimated Creatinine Clearance: 52.2 mL/min (by C-G formula based on SCr of 0.61 mg/dL). Liver Function Tests: Recent Labs  Lab 01/14/19 2348 01/16/19 0421  AST 52* 35  ALT 40 35  ALKPHOS 77 63  BILITOT 0.9 0.6  PROT 7.1 6.8  ALBUMIN 3.7 3.4*   No results for input(s): LIPASE, AMYLASE in the last 168 hours. No results for input(s): AMMONIA in the last 168 hours. Coagulation Profile: Recent Labs  Lab 01/14/19 2348  INR 1.2   Cardiac Enzymes: No results for input(s): CKTOTAL, CKMB, CKMBINDEX, TROPONINI in the last 168 hours. BNP (last 3 results) No results for input(s): PROBNP in the last 8760 hours. HbA1C: Recent Labs    01/15/19 0625  HGBA1C 11.2*   CBG: Recent Labs  Lab 01/15/19 1659 01/15/19 1827 01/15/19 2207 01/16/19 0812 01/16/19 1146  GLUCAP 503* 495* 332* 219* 305*   Lipid  Profile: No results for input(s): CHOL, HDL, LDLCALC, TRIG, CHOLHDL, LDLDIRECT in the last 72 hours. Thyroid Function Tests: Recent Labs    01/15/19 0625  TSH 1.441   Anemia Panel: No results for input(s): VITAMINB12, FOLATE, FERRITIN, TIBC, IRON, RETICCTPCT in the last 72 hours. Urine analysis:    Component Value Date/Time   COLORURINE YELLOW 01/14/2019 2348   APPEARANCEUR HAZY (A) 01/14/2019 2348   LABSPEC 1.020 01/14/2019 2348   PHURINE 5.0 01/14/2019 2348   GLUCOSEU 50 (A) 01/14/2019 2348   GLUCOSEU NEGATIVE 10/08/2012 1148   HGBUR NEGATIVE 01/14/2019 2348   BILIRUBINUR NEGATIVE 01/14/2019 2348   BILIRUBINUR negative 12/18/2017 1613   KETONESUR 5 (A) 01/14/2019 2348   PROTEINUR 100 (A) 01/14/2019 2348   UROBILINOGEN 0.2 12/18/2017 1613   UROBILINOGEN 0.2 11/01/2014 2113   NITRITE POSITIVE (A) 01/14/2019 2348   LEUKOCYTESUR TRACE (A) 01/14/2019 2348   Recent Results (from the past 240 hour(s))  Urine culture     Status: Abnormal (Preliminary result)  Collection Time: 01/14/19 12:37 AM   Specimen: In/Out Cath Urine  Result Value Ref Range Status   Specimen Description IN/OUT CATH URINE  Final   Special Requests   Final    NONE Performed at [facility], [place] [address], [place], [place] [address]    Culture >=100,000 COLONIES/mL GRAM NEGATIVE RODS (A)  Final   Report Status PENDING  Incomplete  Blood Culture (routine x 2)     Status: None (Preliminary result)   Collection Time: 01/14/19 11:48 PM   Specimen: BLOOD RIGHT HAND  Result Value Ref Range Status   Specimen Description BLOOD RIGHT HAND  Final   Special Requests   Final    BOTTLES DRAWN AEROBIC AND ANAEROBIC Blood Culture adequate volume   Culture   Final    NO GROWTH 1 DAY Performed at [facility], [place] [address], [place], [place] [address]    Report Status PENDING  Incomplete  Blood Culture (routine x 2)     Status: None (Preliminary result)   Collection Time: 01/14/19 11:49 PM    Specimen: BLOOD RIGHT FOREARM  Result Value Ref Range Status   Specimen Description BLOOD RIGHT FOREARM  Final   Special Requests   Final    BOTTLES DRAWN AEROBIC AND ANAEROBIC Blood Culture adequate volume   Culture   Final    NO GROWTH 1 DAY Performed at [facility], [place] [address], [place],  [address]    Report Status PENDING  Incomplete      Radiology Studies: Dg Chest Port 1 View  Result Date: 01/14/2019 CLINICAL DATA:  Confused, possibly septic EXAM: PORTABLE CHEST 1 VIEW COMPARISON:  Radiograph 11/12/2014 FINDINGS: Streaky atelectatic changes. Attenuation in the left lung base likely corresponds to abundant pericardial fat seen on prior studies. No consolidation, features of edema, pneumothorax, or effusion. The aorta is calcified. The remaining cardiomediastinal contours are unremarkable. No acute osseous or soft tissue abnormality. IMPRESSION: 1. Atelectasis, no other acute cardiopulmonary disease. 2. Attenuation in the left lung base likely corresponds to pericardial fat, unchanged from multiple priors. 3.  Aortic Atherosclerosis (ICD10-I70.0). Electronically Signed   By: Lovena Le M.D.   On: 01/14/2019 23:32    Scheduled Meds:  apixaban  5 mg Oral BID   aspirin EC  81 mg Oral Daily   dexamethasone (DECADRON) injection  6 mg Intravenous Q24H   DULoxetine  60 mg Oral Daily   insulin aspart  0-20 Units Subcutaneous TID WC   insulin aspart  0-5 Units Subcutaneous QHS   insulin aspart  4 Units Subcutaneous TID WC   insulin glargine  35 Units Subcutaneous BID   levothyroxine  112 mcg Oral Daily   potassium chloride  20 mEq Oral Once   pravastatin  20 mg Oral q1800   Continuous Infusions:  cefTRIAXone (ROCEPHIN)  IV Stopped (01/16/19 0133)   magnesium sulfate bolus IVPB     remdesivir 100 mg in NS 250 mL 100 mg (01/16/19 0936)     LOS: 1 day   Time spent: 35 minutes.  Patrecia Pour, MD [facility] [email or web address] 01/16/2019, 1:58 PM

## 2019-01-16 NOTE — Progress Notes (Signed)
Occupational Therapy Evaluation Patient Details Name: Jaclyn [name] MRN: [id number] DOB: 31-Jul-1935 Today's Date: 01/16/2019    History of Present Illness 83 y.o. female with medical history significant for paroxysmal atrial fibrillation on Eliquis, history of CVA, hypertension, poorly controlled insulin-dependent diabetes mellitus, recurrent UTIs, and hypothyroidism, now presenting to the emergency department with 2 days of lethargy, confusion, and malodorous urine.  Patient is brought in by her son who is concerned for recurrent UTI, noting that she has developed some more fatigue and confusion in the setting of a UTI previousl  COVID-19 antigen test is positive.   Clinical Impression   Entered room with telesitter alarming with pt sliding down in recliner. +2 total A to slide pt up in recliner to prevent fall. Spoke with pt's son [name] via phone for PLOF. Pt lived with son and was modified independent with mobility and ADL using a cane. Pt did some cooking and cleaning but did not drive. Pt restless and agitated at times during assessment;appears to be having hallucinations/talking to people who were not in the room. Required +2 Mod A to transfer to [facility]. Overall Max A with ADL at this time primarily due to confusion. Pt will most likely need rehab at SNF. Son states he works during the day and his sister is at home, but is disabled. Will follow acutely to facilitate DC to next venue of care.  Returned after visit to let son talk with his mother. Pt appeared to have a better conversation with her son, however she is very [place] which made speaking on the phone difficult. Nsg planning to call son this pm to give him a medical update.     Follow Up Recommendations  SNF;Supervision/Assistance - 24 hour(pending progress)    Equipment Recommendations  3 in 1 bedside commode    Recommendations for Other Services PT consult     Precautions / Restrictions Precautions Precautions: Fall Precaution  Comments: safety tele sitter      Mobility Bed Mobility Overal bed mobility: Needs Assistance Bed Mobility: Sit to Supine       Sit to supine: Mod assist;+2 for physical assistance      Transfers Overall transfer level: Needs assistance   Transfers: Sit to/from Stand;Stand Pivot Transfers Sit to Stand: Mod assist Stand pivot transfers: Mod assist;+2 physical assistance            Balance Overall balance assessment: Needs assistance   Sitting balance-Leahy Scale: Fair       Standing balance-Leahy Scale: Poor                             ADL either performed or assessed with clinical judgement   ADL Overall ADL's : Needs assistance/impaired     Grooming: Moderate assistance;Sitting   Upper Body Bathing: Moderate assistance;Sitting   Lower Body Bathing: Maximal assistance;Sit to/from stand   Upper Body Dressing : Moderate assistance;Sitting   Lower Body Dressing: Maximal assistance;Sit to/from stand   Toilet Transfer: Moderate assistance;+2 for safety/equipment;BSC   Toileting- Clothing Manipulation and Hygiene: Maximal assistance       Functional mobility during ADLs: Moderate assistance;+2 for physical assistance;+2 for safety/equipment General ADL Comments: Pt trying to get out of recliner with call bell wrapped bewtween her legs; tele sitter alarming; pt's bottom on footrest. +2 to slide back up into chair before standing and pivoting onto BSC. Max A to clean. Pt ableto take a few steps to bed but required max A +  2 to turn and required bed beign brought up behind pt as pt was unable to probelm solve to step backwards     Vision   Additional Comments: poor vision; states she feels pressure behind eyes; sometimes wears readers     Perception     Praxis      Pertinent Vitals/Pain Pain Assessment: Faces Faces Pain Scale: Hurts little more Pain Location: general discomfort Pain Descriptors / Indicators: Discomfort Pain Intervention(s):  Limited activity within patient's tolerance     Hand Dominance Right   Extremity/Trunk Assessment Upper Extremity Assessment Upper Extremity Assessment: Generalized weakness   Lower Extremity Assessment Lower Extremity Assessment: Defer to PT evaluation   Cervical / Trunk Assessment Cervical / Trunk Assessment: Normal   Communication Communication Communication: HOH   Cognition Arousal/Alertness: Awake/alert Behavior During Therapy: Restless;Anxious;Agitated;Impulsive Overall Cognitive Status: Impaired/Different from baseline Area of Impairment: Orientation;Attention;Memory;Following commands;Safety/judgement;Awareness;Problem solving                 Orientation Level: Disoriented to;Place;Time;Situation Current Attention Level: Focused Memory: Decreased recall of precautions;Decreased short-term memory Following Commands: Follows one step commands inconsistently Safety/Judgement: Decreased awareness of safety;Decreased awareness of deficits Awareness: Intellectual Problem Solving: Slow processing;Decreased initiation;Difficulty sequencing;Requires verbal cues;Requires tactile cues     General Comments  likes christian music; likes to [place]; [place]; grandchildren Murray; GG children Anabelle and Stevie    Exercises     Shoulder Instructions      Home Living Family/patient expects to be discharged to:: Private residence Living Arrangements: Children Available Help at Discharge: Family;Available 24 hours/day(howver daughter is disabled; son works during the day) Type of Home: House Home Access: Level entry     [place]: One level     Bathroom Shower/Tub: Corporate investment banker: Programmer, systems: (maybe)   Home Equipment: [place] - single point;Grab bars - tub/shower          Prior Functioning/Environment Level of Independence: Independent with assistive device(s)(used a cane)        Comments: was  able to care for herself; usually wears depends        OT Problem List: Decreased strength;Decreased activity tolerance;Impaired balance (sitting and/or standing);Impaired vision/perception;Decreased coordination;Decreased cognition;Decreased safety awareness;Decreased knowledge of use of DME or AE;Cardiopulmonary status limiting activity;Pain      OT Treatment/Interventions: Self-care/ADL training;Therapeutic exercise;Neuromuscular education;Energy conservation;DME and/or AE instruction;Therapeutic activities;Cognitive remediation/compensation;Patient/family education;Balance training    OT Goals(Current goals can be found in the care plan section) Acute Rehab OT Goals Patient Stated Goal: Per son for pt to get better OT Goal Formulation: With family Time For Goal Achievement: 01/30/19 Potential to Achieve Goals: Good  OT Frequency: Min 2X/week   Barriers to D/C:            Co-evaluation              AM-PAC OT "6 Clicks" Daily Activity     Outcome Measure Help from another person eating meals?: A Little Help from another person taking care of personal grooming?: A Lot Help from another person toileting, which includes using toliet, bedpan, or urinal?: A Lot Help from another person bathing (including washing, rinsing, drying)?: A Lot Help from another person to put on and taking off regular upper body clothing?: A Lot Help from another person to put on and taking off regular lower body clothing?: A Lot 6 Click Score: 13   End of Session Nurse Communication: Mobility status;Other (comment)(PLOF)  Activity Tolerance: Other (comment)(Pt extremely restless) Patient left:  in bed;with call bell/phone within reach;with bed alarm set;with nursing/sitter in room  OT Visit Diagnosis: Unsteadiness on feet (R26.81);Other abnormalities of gait and mobility (R26.89);Muscle weakness (generalized) (M62.81);Other symptoms and signs involving cognitive function;Pain;Low vision, both eyes  (H54.2) Pain - part of body: (general discomfort)                Time: [id number] OT Time Calculation (min): 45 min Charges:  OT General Charges $OT Visit: 1 Visit OT Evaluation $OT Eval Moderate Complexity: 1 Mod OT Treatments $Self Care/Home Management : 23-37 mins  Maurie Boettcher, OT/L   Acute OT Clinical Specialist [place] Pager [phone] Office [phone]   [facility] 01/16/2019, 4:43 PM

## 2019-01-17 LAB — COMPREHENSIVE METABOLIC PANEL
ALT: 28 U/L (ref 0–44)
AST: 32 U/L (ref 15–41)
Albumin: 3.6 g/dL (ref 3.5–5.0)
Alkaline Phosphatase: 62 U/L (ref 38–126)
Anion gap: 10 (ref 5–15)
BUN: 20 mg/dL (ref 8–23)
CO2: 23 mmol/L (ref 22–32)
Calcium: 8.9 mg/dL (ref 8.9–10.3)
Chloride: 108 mmol/L (ref 98–111)
Creatinine, Ser: 0.82 mg/dL (ref 0.44–1.00)
GFR calc Af Amer: >60 mL/min (ref >60)
GFR calc non Af Amer: >60 mL/min (ref >60)
Glucose, Bld: 116 mg/dL — ABNORMAL HIGH (ref 70–99)
Potassium: 3.6 mmol/L (ref 3.5–5.1)
Sodium: 141 mmol/L (ref 135–145)
Total Bilirubin: 0.9 mg/dL (ref 0.3–1.2)
Total Protein: 6.8 g/dL (ref 6.5–8.1)

## 2019-01-17 LAB — URINE CULTURE: Culture: >=100000 — AB

## 2019-01-17 LAB — MAGNESIUM: Magnesium: 1.6 mg/dL — ABNORMAL LOW (ref 1.7–2.4)

## 2019-01-17 LAB — C-REACTIVE PROTEIN: CRP: 0.8 mg/dL (ref <1.0)

## 2019-01-17 LAB — GLUCOSE, CAPILLARY
Glucose-Capillary: 130 mg/dL — ABNORMAL HIGH (ref 70–99)
Glucose-Capillary: 70 mg/dL (ref 70–99)
Glucose-Capillary: 237 mg/dL — ABNORMAL HIGH (ref 70–99)

## 2019-01-17 MED ORDER — MAGNESIUM SULFATE 2 GM/50ML IV SOLN
2.0000 g | Freq: Once | INTRAVENOUS | Status: AC
  Administered 2019-01-17: 2 g via INTRAVENOUS
  Filled 2019-01-17: qty 50

## 2019-01-17 MED ORDER — INSULIN GLARGINE 100 UNIT/ML ~~LOC~~ SOLN
30.0000 [IU] | Freq: Two times a day (BID) | SUBCUTANEOUS | Status: DC
  Administered 2019-01-17: 30 [IU] via SUBCUTANEOUS
  Filled 2019-01-17 (×2): qty 0.3

## 2019-01-17 NOTE — Progress Notes (Signed)
Inpatient Diabetes Program Recommendations  AACE/ADA: New Consensus Statement on Inpatient Glycemic Control (2015)  Target Ranges:  Prepandial:   less than 140 mg/dL      Peak postprandial:   less than 180 mg/dL (1-2 hours)      Critically ill patients:  140 - 180 mg/dL   Lab Results  Component Value Date   GLUCAP 70 01/17/2019   HGBA1C 11.2 (H) 01/15/2019    Review of Glycemic Control Results for COLETTE, JOENS (MRN [id number]) as of 01/17/2019 14:12  Ref. Range 01/16/2019 11:46 01/16/2019 16:23 01/16/2019 21:42 01/17/2019 07:39 01/17/2019 11:41  Glucose-Capillary Latest Ref Range: 70 - 99 mg/dL 305 (H) 321 (H) 182 (H) 130 (H) 70   Diabetes history: DM 2 Outpatient Diabetes medications:  Lantus 90 units daily, Metformin 500 mg bid, Glucotrol 10 mg daily Current orders for Inpatient glycemic control:  Novolog resistant tid with meals and HS Novolog 4 units tid with meals Lantus 40 units bid Inpatient Diabetes Program Recommendations:    Consider reducing Lantus to 30 units bid.  Also may consider reducing Novolog correction to moderate.   Thanks  Adah Perl, RN, BC-ADM Inpatient Diabetes Coordinator Pager [phone] (8a-5p)

## 2019-01-17 NOTE — Evaluation (Signed)
Physical Therapy Evaluation Patient Details Name: Jaclyn [name] MRN: [id number] DOB: Sep 23, 1935 Today's Date: 01/17/2019   History of Present Illness  83 y.o. female with medical history significant for paroxysmal atrial fibrillation on Eliquis, history of CVA, hypertension, poorly controlled insulin-dependent diabetes mellitus, recurrent UTIs, and hypothyroidism, now presenting to the emergency department with 2 days of lethargy, confusion, and malodorous urine.  Patient is brought in by her son who is concerned for recurrent UTI, noting that she has developed some more fatigue and confusion in the setting of a UTI previousl  COVID-19 antigen test is positive.  Clinical Impression  Pt is weak nearly buckling over weak legs after walking 20' with RW.  VSS on RA during gait (as monitored via nellcor finger probe) and pt remains confused, tangential at times with difficulty with complex commands.  Pt would benefit from post acute rehab at discharge.  PT to follow acutely for deficits listed below.    Follow Up Recommendations SNF    Equipment Recommendations  Rolling walker with 5" wheels    Recommendations for Other Services   NA    Precautions / Restrictions Precautions Precautions: Fall Precaution Comments: tele safety sitter, posterior bias      Mobility  Bed Mobility Overal bed mobility: Needs Assistance Bed Mobility: Supine to Sit     Supine to sit: Min assist     General bed mobility comments: Min assist to provide hand to pull up to sitting from elevated HOB with bed rail use   Transfers Overall transfer level: Needs assistance Equipment used: Rolling walker (2 wheeled) Transfers: Sit to/from Stand Sit to Stand: Min assist Stand pivot transfers: Min assist       General transfer comment: Heavy min assist to stand from bed to [facility] with RW and then again from [facility] to RW for gait.  Heavy min assist for balance and to assist in anterior translation of trunk over feet,  posterior bias throguhout.   Ambulation/Gait Ambulation/Gait assistance: Min assist;Mod assist Gait Distance (Feet): 20 Feet Assistive device: Rolling walker (2 wheeled) Gait Pattern/deviations: Step-through pattern;Shuffle;Trunk flexed     General Gait Details: Pt with shuffling gait pattern, cues to stay closer to RW, pt's legs starting to buckle the closer we got to the chair, pt attempting to sit prematurely nearly missing the chair.          Balance Overall balance assessment: Needs assistance Sitting-balance support: Feet supported;Bilateral upper extremity supported Sitting balance-Leahy Scale: Poor Sitting balance - Comments: min guard assist EOB, any movement of limbs and she loses her balance posteriorly.  Postural control: Posterior lean Standing balance support: Bilateral upper extremity supported Standing balance-Leahy Scale: Poor Standing balance comment: needs external assist in standing from PT and RW.                              Pertinent Vitals/Pain Pain Assessment: No/denies pain    Home Living Family/patient expects to be discharged to:: Private residence Living Arrangements: Children Available Help at Discharge: Family;Available 24 hours/day(daughter is disabled, son works) Type of Home: House Home Access: Level entry     [place]: One [place]: [place] - single point;Grab bars - tub/shower      Prior Function Level of Independence: Independent with assistive device(s)         Comments: was able to care for herself; usually wears depends, uses a cane      Hand Dominance  Dominant Hand: Right    Extremity/Trunk Assessment   Upper Extremity Assessment Upper Extremity Assessment: Defer to OT evaluation    Lower Extremity Assessment Lower Extremity Assessment: Generalized weakness    Cervical / Trunk Assessment Cervical / Trunk Assessment: Kyphotic  Communication   Communication: HOH  Cognition  Arousal/Alertness: Awake/alert Behavior During Therapy: Impulsive Overall Cognitive Status: Impaired/Different from baseline Area of Impairment: Orientation;Memory;Attention;Following commands;Awareness;Safety/judgement;Problem solving                 Orientation Level: Disoriented to;Place;Time;Situation Current Attention Level: Sustained Memory: Decreased recall of precautions;Decreased short-term memory Following Commands: Follows one step commands inconsistently Safety/Judgement: Decreased awareness of safety;Decreased awareness of deficits Awareness: Emergent Problem Solving: Difficulty sequencing;Requires verbal cues;Requires tactile cues General Comments: Pt with difficulty with more complex commands often requiring repetition(this could also be her hearing), pt reporting she is falling backwards, did not know where she was and is often tangental.       General Comments General comments (skin integrity, edema, etc.): VSS throughout on RA via nellcor finger probe.         Assessment/Plan    PT Assessment Patient needs continued PT services  PT Problem List Decreased strength;Decreased activity tolerance;Decreased balance;Decreased mobility;Decreased cognition;Decreased knowledge of use of DME;Decreased safety awareness;Decreased knowledge of precautions       PT Treatment Interventions DME instruction;Gait training;Stair training;Functional mobility training;Therapeutic activities;Therapeutic exercise;Balance training;Cognitive remediation;Patient/family education    PT Goals (Current goals can be found in the Care Plan section)  Acute Rehab PT Goals Patient Stated Goal: none stated PT Goal Formulation: With patient Time For Goal Achievement: 01/31/19 Potential to Achieve Goals: Good    Frequency Min 2X/week           AM-PAC PT "6 Clicks" Mobility  Outcome Measure Help needed turning from your back to your side while in a flat bed without using bedrails?: A  Little Help needed moving from lying on your back to sitting on the side of a flat bed without using bedrails?: A Little Help needed moving to and from a bed to a chair (including a wheelchair)?: A Little Help needed standing up from a chair using your arms (e.g., wheelchair or bedside chair)?: A Little Help needed to walk in hospital room?: A Lot Help needed climbing 3-5 steps with a railing? : A Lot 6 Click Score: 16    End of Session Equipment Utilized During Treatment: Gait belt Activity Tolerance: Patient limited by fatigue Patient left: in chair;with call bell/phone within reach;with chair alarm set   PT Visit Diagnosis: Muscle weakness (generalized) (M62.81);Difficulty in walking, not elsewhere classified (R26.2)    Time: 1511-1540 PT Time Calculation (min) (ACUTE ONLY): 29 min   Charges:           Verdene Lennert, PT, DPT  Acute Rehabilitation [phone] pager #[phone] office  @ [place]: [phone]   PT Evaluation $PT Eval Moderate Complexity: 1 Mod PT Treatments $Therapeutic Activity: 8-22 mins       01/17/2019, 4:12 PM

## 2019-01-17 NOTE — Progress Notes (Signed)
PROGRESS NOTE  Davasia Kellerhals  [id number] DOB: [birth year] DOA: 01/14/2019 PCP: Jinny Sanders, MD   Brief Narrative: Ellenie [name] is a 83 y.o. female with medical history significant for paroxysmal atrial fibrillation on Eliquis, history of CVA, hypertension, poorly controlled insulin-dependent diabetes mellitus, recurrent UTIs, and hypothyroidism, now presenting to the emergency department with 2 days of lethargy, confusion, and malodorous urine.  Patient is brought in by her son who is concerned for recurrent UTI, noting that she has developed some more fatigue and confusion in the setting of a UTI previously.  Patient reports fatigue, generalized aches and pains, and reports that she has been unable to ambulate about her house today due to both fatigue and severe aches.  She denies focal numbness or weakness and denies chest pain.  She has had intermittent pain in the right flank recently.  Denies abdominal pain.  Her son was concern for waxing and waning confusion over the past 2 days, particularly in the evenings, and also notes that the patient has been more lethargic.  ED Course: Upon arrival to the ED, patient is found to be febrile to 38.2 C, saturating 88% on room air, tachypneic, and with stable blood pressure.  EKG features a sinus rhythm and chest x-ray is notable for atelectasis.  Chemistry panel notable for serum glucose of 234 and slight elevation in AST.  CBC is unremarkable.  Lactic acid is 2.0.  Urinalysis is compatible with possible infection.  COVID-19 antigen test is positive.  Blood and urine cultures were collected in the emergency department and the patient was treated with 2 L normal saline, acetaminophen, and Rocephin.  Assessment & Plan: Principal Problem:   COVID-19 virus infection Active Problems:   Hypothyroidism   PAROXYSMAL ATRIAL FIBRILLATION   History of CVA (cerebrovascular accident)   Type 2 diabetes mellitus with vascular disease ([place])   Acute lower  UTI  Acute hypoxic respiratory failure due to covid-19 virus infection: No CXR infiltrate. Has been weaned to room air.  - Complete 5 days remdesivir (11/28 - 12/2) - Continue steroids.  - CRP has normalized and d-dimer is normal, so will no longer check, monitor ALT (remains wnl). - Contact/airborne precautions - Antitussives prn   Acute metabolic encephalopathy and acute delirium: Due to covid-19 and UTI in addition to acute delirium of which the patient has a history.  - Delirium precautions - Continue home medications, but avoid polypharmacy - Continue covid and UTI Tx's  Normocytic anemia: Suspected to be anemia of chronic disease.  - Recheck in AM.  Klebsiella UTI:  - Continue CTX pending urine culture sensitivities. Blood cultures NGTD.  T2DM, poorly controlled with hyperglycemia: HbA1c 11.2%.  - Continue lantus, Changed from 90u once daily to 35u BID, increased to 40u BID with much better control.  - Augmented to resistant SSI and continue mealtime insulin. Continue HS coverage.   Hypokalemia: Resolved w/replacement. - Supplement and monitor   Hypomagnesemia:  - Supplement again today and monitor  Paroxysmal atrial fibrillation - Continue eliquis  Hypothyroidism: TSH 1.441 - Continue synthroid  History of CVA, right ICA stenosis: - Continue ASA in addition to eliquis, statin  Hyperlipidemia -Continue pravastatin (tolerating this)  Anxiety/Depression - Restart home cymbalta 60 mg (high dose for age)  DVT prophylaxis: Eliquis Code Status: Full Family Communication: Called Son without answer this afternoon. Disposition Plan: Uncertain, pending clinical progress  Consultants:   None  Procedures:   None  Antimicrobials:  Ceftriaxone 11/28 >>   Remdesivir 11/28 - 12/2  Subjective: Remains intermittently agitated. Sleeping well this morning, when awoken has no complaints. Denies shortness of breath, chest pain, abd pain, or dysuria.  Objective:  Vitals:   01/16/19 0809 01/16/19 1600 01/16/19 2037 01/17/19 0415  BP: 118/64 110/75 (!) 124/58 (!) 142/96  Pulse: 71 (!) 55 73 71  Resp: 18 20 20 18   Temp: 97.7 F (36.5 C) 98.1 F (36.7 C) 98.2 F (36.8 C) 98.1 F (36.7 C)  TempSrc: Oral Axillary Axillary Oral  SpO2: 93% 91% 98% 96%  Weight:      Height:        Intake/Output Summary (Last 24 hours) at 01/17/2019 0745 Last data filed at 01/17/2019 0630 Gross per 24 hour  Intake 503.89 ml  Output 250 ml  Net 253.89 ml   Filed Weights   01/14/19 2351  Weight: 68 kg   Gen: Elderly female in no distress Pulm: Nonlabored breathing room air. Clear. CV: Regular rate and rhythm. No murmur, rub, or gallop. No JVD, no dependent edema. GI: Abdomen soft, non-tender, non-distended, with normoactive bowel sounds.  Ext: Warm, no deformities Skin: No new rashes, lesions or ulcers on visualized skin. Neuro: Drowsy but rousable. No focal neurological deficits on limited exam. Psych: Judgement and insight appear impaired.   Data Reviewed: I have personally reviewed following labs and imaging studies  CBC: Recent Labs  Lab 01/14/19 2348 01/16/19 0421  WBC 6.1 7.4  NEUTROABS 4.5  --   HGB 12.4 11.5*  HCT 36.6 34.4*  MCV 90.8 91.7  PLT 185 [id number]   Basic Metabolic Panel: Recent Labs  Lab 01/14/19 2348 01/16/19 0421  NA 135 135  K 3.7 3.4*  CL 96* 104  CO2 25 20*  GLUCOSE 234* 157*  BUN 8 17  CREATININE 0.84 0.61  CALCIUM 9.3 8.9  MG  --  1.4*  PHOS  --  3.5   GFR: Estimated Creatinine Clearance: 52.2 mL/min (by C-G formula based on SCr of 0.61 mg/dL). Liver Function Tests: Recent Labs  Lab 01/14/19 2348 01/16/19 0421  AST 52* 35  ALT 40 35  ALKPHOS 77 63  BILITOT 0.9 0.6  PROT 7.1 6.8  ALBUMIN 3.7 3.4*   No results for input(s): LIPASE, AMYLASE in the last 168 hours. No results for input(s): AMMONIA in the last 168 hours. Coagulation Profile: Recent Labs  Lab 01/14/19 2348  INR 1.2   Cardiac Enzymes:  No results for input(s): CKTOTAL, CKMB, CKMBINDEX, TROPONINI in the last 168 hours. BNP (last 3 results) No results for input(s): PROBNP in the last 8760 hours. HbA1C: Recent Labs    01/15/19 0625  HGBA1C 11.2*   CBG: Recent Labs  Lab 01/15/19 2207 01/16/19 0812 01/16/19 1146 01/16/19 1623 01/16/19 2142  GLUCAP 332* 219* 305* 321* 182*   Lipid Profile: No results for input(s): CHOL, HDL, LDLCALC, TRIG, CHOLHDL, LDLDIRECT in the last 72 hours. Thyroid Function Tests: Recent Labs    01/15/19 0625  TSH 1.441   Anemia Panel: No results for input(s): VITAMINB12, FOLATE, FERRITIN, TIBC, IRON, RETICCTPCT in the last 72 hours. Urine analysis:    Component Value Date/Time   COLORURINE YELLOW 01/14/2019 2348   APPEARANCEUR HAZY (A) 01/14/2019 2348   LABSPEC 1.020 01/14/2019 2348   PHURINE 5.0 01/14/2019 2348   GLUCOSEU 50 (A) 01/14/2019 2348   GLUCOSEU NEGATIVE 10/08/2012 1148   HGBUR NEGATIVE 01/14/2019 2348   BILIRUBINUR NEGATIVE 01/14/2019 2348   BILIRUBINUR negative 12/18/2017 1613   KETONESUR 5 (A) 01/14/2019 2348   PROTEINUR 100 (  A) 01/14/2019 2348   UROBILINOGEN 0.2 12/18/2017 1613   UROBILINOGEN 0.2 11/01/2014 2113   NITRITE POSITIVE (A) 01/14/2019 2348   LEUKOCYTESUR TRACE (A) 01/14/2019 2348   Recent Results (from the past 240 hour(s))  Urine culture     Status: Abnormal (Preliminary result)   Collection Time: 01/14/19 12:37 AM   Specimen: In/Out Cath Urine  Result Value Ref [place] Status   Specimen Description IN/OUT CATH URINE  Final   Special Requests   Final    NONE Performed at [facility], [place] [address], [place], [place] [address]    Culture >=100,000 COLONIES/mL KLEBSIELLA PNEUMONIAE (A)  Final   Report Status PENDING  Incomplete  Blood Culture (routine x 2)     Status: None (Preliminary result)   Collection Time: 01/14/19 11:48 PM   Specimen: BLOOD RIGHT HAND  Result Value Ref [place] Status   Specimen Description BLOOD RIGHT HAND  Final    Special Requests   Final    BOTTLES DRAWN AEROBIC AND ANAEROBIC Blood Culture adequate volume   Culture   Final    NO GROWTH 2 DAYS Performed at [facility], [place] [address] [place], [place] [address]    Report Status PENDING  Incomplete  Blood Culture (routine x 2)     Status: None (Preliminary result)   Collection Time: 01/14/19 11:49 PM   Specimen: BLOOD RIGHT FOREARM  Result Value Ref [place] Status   Specimen Description BLOOD RIGHT FOREARM  Final   Special Requests   Final    BOTTLES DRAWN AEROBIC AND ANAEROBIC Blood Culture adequate volume   Culture   Final    NO GROWTH 2 DAYS Performed at [facility], [place] [address], [place], [place] [address]    Report Status PENDING  Incomplete      Radiology Studies: No results found.  Scheduled Meds: . apixaban  5 mg Oral BID  . aspirin EC  81 mg Oral Daily  . dexamethasone (DECADRON) injection  6 mg Intravenous Q24H  . DULoxetine  60 mg Oral Daily  . insulin aspart  0-20 Units Subcutaneous TID WC  . insulin aspart  0-5 Units Subcutaneous QHS  . insulin aspart  4 Units Subcutaneous TID WC  . insulin glargine  40 Units Subcutaneous BID  . levothyroxine  112 mcg Oral Daily  . pravastatin  20 mg Oral q1800   Continuous Infusions: . cefTRIAXone (ROCEPHIN)  IV 1 g (01/16/19 2140)  . remdesivir 100 mg in NS 250 mL Stopped (01/16/19 1012)     LOS: 2 days   Time spent: 35 minutes.  Patrecia Pour, MD [facility] [email or web address] 01/17/2019, 7:45 AM

## 2019-01-17 NOTE — NC FL2 (Signed)
[place] LEVEL OF CARE SCREENING TOOL     IDENTIFICATION  Patient Name: Jaclyn [name] Birthdate: November 18, 1935 Sex: female Admission Date (Current Location): 01/14/2019  [facility] and [place] Number:  Herbalist and Address:  [facility], [place] [address] [place], [place] [address]      Provider Number: [id number]  Attending Physician Name and Address:  Patrecia Pour, MD  Relative Name and Phone Number:  [name] (son) [phone]    Current Level of Care: Hospital Recommended Level of Care: [place] Prior Approval Number:    Date Approved/Denied:   PASRR Number: [id number]  Discharge Plan: SNF    Current Diagnoses: Patient Active Problem List   Diagnosis Date Noted  . COVID-19 virus infection 01/15/2019  . Acute lower UTI 01/15/2019  . B12 deficiency 01/22/2018  . History of CVA (cerebrovascular accident) 06/20/2017  . Type 2 diabetes mellitus with vascular disease ([place]) 06/20/2017  . Slurred speech   . Stenosis of right carotid artery   . Peripheral edema 07/29/2016  . Decreased alertness 07/07/2016  . Imbalance 04/07/2016  . Intertriginous candidiasis 04/26/2015  . Hard of hearing 04/12/2015  . TIA (transient ischemic attack) 04/10/2015  . History of recurrent UTIs 04/10/2015  . Dysuria 03/09/2015  . Tremor 05/11/2014  . Leg weakness, bilateral 05/11/2014  . Fatty liver 08/04/2013  . Personal history of colonic polyps 10/05/2012  . Esophageal reflux 10/05/2012  . Carotid stenosis, symptomatic, with infarction ([place]) 10/14/2010  . ANXIETY DEPRESSION 01/13/2008  . Essential hypertension, benign 01/13/2008  . PAROXYSMAL ATRIAL FIBRILLATION 01/13/2008  . Intracranial vascular stenosis 01/13/2008  . DIVERTICULOSIS OF COLON 01/13/2008  . Fibromyalgia 01/13/2008  . CHEST PAIN, ATYPICAL 01/13/2008  . Hypothyroidism 04/13/2007  . Poorly controlled diabetes mellitus ([place] ) 04/13/2007  . Vitamin D  deficiency 04/13/2007  . Hyperlipidemia LDL goal <70 04/13/2007  . DEGENERATIVE JOINT DISEASE 04/13/2007    Orientation RESPIRATION BLADDER Height & Weight     Time, Situation, Self  Normal Incontinent, External catheter Weight: 150 lb (68 kg) Height:  5\' 4"  (162.6 cm)  BEHAVIORAL SYMPTOMS/MOOD NEUROLOGICAL BOWEL NUTRITION STATUS      Continent Diet(see discharge summary)  AMBULATORY STATUS COMMUNICATION OF NEEDS Skin   Limited Assist Verbally Normal                       Personal Care Assistance Level of Assistance  Bathing, Feeding, Dressing, Total care Bathing Assistance: Limited assistance Feeding assistance: Limited assistance Dressing Assistance: Limited assistance Total Care Assistance: Limited assistance   Functional Limitations Info  Sight, Hearing, Speech Sight Info: Adequate Hearing Info: Impaired Speech Info: Adequate    SPECIAL CARE FACTORS FREQUENCY  PT (By licensed PT), OT (By licensed OT)     PT Frequency: min 5x weekly OT Frequency: min 5x weekly            Contractures Contractures Info: Not present    Additional Factors Info  Code Status, Allergies Code Status Info: full Allergies Info: Naproxen Sodium, Statins, Sulfonamide Derivatives, Latex, Shellfish Allergy           Current Medications (01/17/2019):  This is the current hospital active medication list Current Facility-Administered Medications  Medication Dose Route Frequency Provider Last Rate Last Dose  . acetaminophen (TYLENOL) tablet 650 mg  650 mg Oral Q6H PRN Vianne Bulls, MD   650 mg at 01/16/19 2137  . albuterol (VENTOLIN HFA) 108 (90 Base) MCG/ACT inhaler 2 puff  2 puff Inhalation Q4H PRN Opyd, Ilene Qua, MD      . apixaban (ELIQUIS) tablet 5 mg  5 mg Oral BID Opyd, Ilene Qua, MD   5 mg at 01/17/19 1004  . aspirin EC tablet 81 mg  81 mg Oral Daily Opyd, Ilene Qua, MD   81 mg at 01/17/19 1003  . cefTRIAXone (ROCEPHIN) 1 g in sodium chloride 0.9 % 100 mL IVPB  1 g  Intravenous Q24H Opyd, Ilene Qua, MD 200 mL/hr at 01/16/19 2140 1 g at 01/16/19 2140  . dexamethasone (DECADRON) injection 6 mg  6 mg Intravenous Q24H Opyd, Ilene Qua, MD   6 mg at 01/16/19 0457  . DULoxetine (CYMBALTA) DR capsule 60 mg  60 mg Oral Daily Mariel Aloe, MD   60 mg at 01/17/19 1005  . haloperidol lactate (HALDOL) injection 2 mg  2 mg Intravenous Q6H PRN Patrecia Pour, MD   2 mg at 01/16/19 1610  . insulin aspart (novoLOG) injection 0-20 Units  0-20 Units Subcutaneous TID WC Patrecia Pour, MD   3 Units at 01/17/19 [phone]  . insulin aspart (novoLOG) injection 0-5 Units  0-5 Units Subcutaneous QHS Vance Gather B, MD      . insulin aspart (novoLOG) injection 4 Units  4 Units Subcutaneous TID WC Opyd, Ilene Qua, MD   4 Units at 01/17/19 0820  . insulin glargine (LANTUS) injection 30 Units  30 Units Subcutaneous BID Patrecia Pour, MD      . levothyroxine (SYNTHROID) tablet 112 mcg  112 mcg Oral Daily Opyd, Ilene Qua, MD   112 mcg at 01/17/19 1004  . LORazepam (ATIVAN) injection 1 mg  1 mg Intravenous Q6H PRN Patrecia Pour, MD   1 mg at 01/16/19 1846  . ondansetron (ZOFRAN) tablet 4 mg  4 mg Oral Q6H PRN Opyd, Ilene Qua, MD       Or  . ondansetron (ZOFRAN) injection 4 mg  4 mg Intravenous Q6H PRN Opyd, Ilene Qua, MD      . pravastatin (PRAVACHOL) tablet 20 mg  20 mg Oral q1800 Opyd, Ilene Qua, MD   20 mg at 01/16/19 1651  . remdesivir 100 mg in sodium chloride 0.9 % 250 mL IVPB  100 mg Intravenous Q24H Opyd, Ilene Qua, MD 500 mL/hr at 01/17/19 1006 100 mg at 01/17/19 1006     Discharge Medications: Please see discharge summary for a list of discharge medications.  Relevant Imaging Results:  Relevant Lab Results:   Additional Information SSN: [id number]  Alberteen [name], LCSW

## 2019-01-18 LAB — COMPREHENSIVE METABOLIC PANEL
ALT: 29 U/L (ref 0–44)
AST: 31 U/L (ref 15–41)
Albumin: 3.3 g/dL — ABNORMAL LOW (ref 3.5–5.0)
Alkaline Phosphatase: 62 U/L (ref 38–126)
Anion gap: 14 (ref 5–15)
BUN: 23 mg/dL (ref 8–23)
CO2: 22 mmol/L (ref 22–32)
Calcium: 8.8 mg/dL — ABNORMAL LOW (ref 8.9–10.3)
Chloride: 104 mmol/L (ref 98–111)
Creatinine, Ser: 0.75 mg/dL (ref 0.44–1.00)
GFR calc Af Amer: >60 mL/min (ref >60)
GFR calc non Af Amer: >60 mL/min (ref >60)
Glucose, Bld: 49 mg/dL — ABNORMAL LOW (ref 70–99)
Potassium: 2.9 mmol/L — ABNORMAL LOW (ref 3.5–5.1)
Sodium: 140 mmol/L (ref 135–145)
Total Bilirubin: 0.4 mg/dL (ref 0.3–1.2)
Total Protein: 6.7 g/dL (ref 6.5–8.1)

## 2019-01-18 LAB — CBC
HCT: 36.4 % (ref 36.0–46.0)
Hemoglobin: 12.1 g/dL (ref 12.0–15.0)
MCH: 30.3 pg (ref 26.0–34.0)
MCHC: 33.2 g/dL (ref 30.0–36.0)
MCV: 91 fL (ref 80.0–100.0)
Platelets: 263 10*3/uL (ref 150–400)
RBC: 4 MIL/uL (ref 3.87–5.11)
RDW: 13.7 % (ref 11.5–15.5)
WBC: 13.3 10*3/uL — ABNORMAL HIGH (ref 4.0–10.5)
nRBC: 0 % (ref 0.0–0.2)

## 2019-01-18 LAB — GLUCOSE, CAPILLARY
Glucose-Capillary: 347 mg/dL — ABNORMAL HIGH (ref 70–99)
Glucose-Capillary: 83 mg/dL (ref 70–99)
Glucose-Capillary: 268 mg/dL — ABNORMAL HIGH (ref 70–99)
Glucose-Capillary: 498 mg/dL — ABNORMAL HIGH (ref 70–99)

## 2019-01-18 LAB — MAGNESIUM: Magnesium: 1.8 mg/dL (ref 1.7–2.4)

## 2019-01-18 MED ORDER — INSULIN GLARGINE 100 UNIT/ML ~~LOC~~ SOLN
25.0000 [IU] | Freq: Two times a day (BID) | SUBCUTANEOUS | Status: DC
  Administered 2019-01-18 (×2): 25 [IU] via SUBCUTANEOUS
  Filled 2019-01-18 (×4): qty 0.25

## 2019-01-18 MED ORDER — CEPHALEXIN 500 MG PO CAPS
500.0000 mg | ORAL_CAPSULE | Freq: Two times a day (BID) | ORAL | Status: DC
  Administered 2019-01-19 – 2019-01-20 (×2): 500 mg via ORAL
  Filled 2019-01-18 (×3): qty 1

## 2019-01-18 MED ORDER — MAGNESIUM SULFATE 2 GM/50ML IV SOLN
2.0000 g | Freq: Once | INTRAVENOUS | Status: AC
  Administered 2019-01-18: 2 g via INTRAVENOUS
  Filled 2019-01-18: qty 50

## 2019-01-18 MED ORDER — CEPHALEXIN 500 MG PO CAPS: 500.0000 mg | ORAL_CAPSULE | Freq: Two times a day (BID) | ORAL | Status: DC

## 2019-01-18 MED ORDER — POTASSIUM CHLORIDE CRYS ER 20 MEQ PO TBCR
40.0000 meq | EXTENDED_RELEASE_TABLET | Freq: Two times a day (BID) | ORAL | Status: AC
  Administered 2019-01-18 (×2): 40 meq via ORAL
  Filled 2019-01-18 (×2): qty 2

## 2019-01-18 NOTE — Progress Notes (Addendum)
PROGRESS NOTE  Jaclyn [name]  [id number] DOB: 08-20-1935 DOA: 01/14/2019 PCP: Jinny Sanders, MD   Brief Narrative: Jaclyn [name] is a 83 y.o. female with medical history significant for paroxysmal atrial fibrillation on Eliquis, history of CVA, hypertension, poorly controlled insulin-dependent diabetes mellitus, recurrent UTIs, and hypothyroidism, now presenting to the emergency department with 2 days of lethargy, confusion, and malodorous urine.  Patient is brought in by her son who is concerned for recurrent UTI, noting that she has developed some more fatigue and confusion in the setting of a UTI previously.  Patient reports fatigue, generalized aches and pains, and reports that she has been unable to ambulate about her house today due to both fatigue and severe aches.  She denies focal numbness or weakness and denies chest pain.  She has had intermittent pain in the right flank recently.  Denies abdominal pain.  Her son was concern for waxing and waning confusion over the past 2 days, particularly in the evenings, and also notes that the patient has been more lethargic.  ED Course: Upon arrival to the ED, patient is found to be febrile to 38.2 C, saturating 88% on room air, tachypneic, and with stable blood pressure.  EKG features a sinus rhythm and chest x-ray is notable for atelectasis.  Chemistry panel notable for serum glucose of 234 and slight elevation in AST.  CBC is unremarkable.  Lactic acid is 2.0.  Urinalysis is compatible with possible infection.  COVID-19 antigen test is positive.  Blood and urine cultures were collected in the emergency department and the patient was treated with 2 L normal saline, acetaminophen, and Rocephin.  Assessment & Plan: Principal Problem:   COVID-19 virus infection Active Problems:   Hypothyroidism   PAROXYSMAL ATRIAL FIBRILLATION   History of CVA (cerebrovascular accident)   Type 2 diabetes mellitus with vascular disease ([place])   Acute lower  UTI  Acute hypoxic respiratory failure due to covid-19 virus infection: No CXR infiltrate. Has been weaned to room air.  - Complete 5 days remdesivir (11/28 - 12/2) - Continue steroids.  - CRP has normalized and d-dimer is normal, so will no longer check, monitor ALT (remains wnl). - Contact/airborne precautions - Antitussives prn   Acute metabolic encephalopathy and acute delirium: Due to covid-19 and UTI in addition to acute delirium of which the patient has a history. Remains altered from baseline but improved. - Delirium precautions - Continue home medications, but avoid polypharmacy - Continue covid and UTI Tx's - Son relates that the patient has reported some pain behind the right eye and difficulty seeing out of that eye. The patient denies this complaint currently and has not had objective evidence of visual field deficit on my exam nor the RN's assessment. We will need to monitor this closely.  Normocytic anemia: Suspected to be anemia of chronic disease. Mild and currently resolved.  Klebsiella UTI:  - Continue CTX x5 total days, complete course with keflex. Blood cultures NGTD.  T2DM, poorly controlled with hyperglycemia: HbA1c 11.2%.  - Continue lantus. Due to hypoglycemia when fasting and erratic po intake, will decrease dose. - Augmented to resistant SSI and continue mealtime insulin. Continue HS coverage.   Hypokalemia: Severe, recurrent - Supplement and monitor, would likely benefit from scheduled supplementation  Hypomagnesemia:  - Supplement again today given severity of hypokalemia and monitor  Paroxysmal atrial fibrillation - Continue eliquis  Hypothyroidism: TSH 1.441 - Continue synthroid  History of CVA, right ICA stenosis: - Continue ASA in addition to eliquis,  statin  Hyperlipidemia - Continue pravastatin (tolerating this)  Anxiety/Depression - Restarted home cymbalta 60 mg (high dose for age)  DVT prophylaxis: Eliquis Code Status: Full  Family Communication: Spoke with son briefly by phone, call interrupted and I've called back twice without answer this afternoon. Disposition Plan: Uncertain, pending clinical progress  Consultants:   None  Procedures:   None  Antimicrobials:  Ceftriaxone 11/28 - 12/1, plan 2 more days keflex to follow  Remdesivir 11/28 - 12/2  Subjective: Eating a little more, no complaints this morning. Son feels her mental status has improved significantly.   Objective: Vitals:   01/17/19 2048 01/18/19 0347 01/18/19 0500 01/18/19 0746  BP: (!) 129/57 98/84  121/64  Pulse:    77  Resp:  (!) 22 19 19   Temp:  98.6 F (37 C)  98 F (36.7 C)  TempSrc:  Axillary  Oral  SpO2:  91%  92%  Weight:      Height:        Intake/Output Summary (Last 24 hours) at 01/18/2019 1351 Last data filed at 01/18/2019 1116 Gross per 24 hour  Intake 1120 ml  Output 550 ml  Net 570 ml   Filed Weights   01/14/19 2351  Weight: 68 kg   Gen: Elderly female in no distress Pulm: Nonlabored breathing room air. Clear. CV: Regular rate and rhythm. No murmur, rub, or gallop. No JVD, no dependent edema. GI: Abdomen soft, non-tender, non-distended, with normoactive bowel sounds.  Ext: Warm, no deformities Skin: No rashes, lesions or ulcers on visualized skin. Neuro: Alert pleasantly confused. No focal neurological deficits, only follows some commands. Psych: Judgement and insight appear impaired. Mood euthymic & affect congruent. Behavior is appropriate.    Data Reviewed: I have personally reviewed following labs and imaging studies  CBC: Recent Labs  Lab 01/14/19 2348 01/16/19 0421 01/18/19 0240  WBC 6.1 7.4 13.3*  NEUTROABS 4.5  --   --   HGB 12.4 11.5* 12.1  HCT 36.6 34.4* 36.4  MCV 90.8 91.7 91.0  PLT 185 215 [id number]   Basic Metabolic Panel: Recent Labs  Lab 01/14/19 2348 01/16/19 0421 01/17/19 0945 01/18/19 0240  NA 135 135 141 140  K 3.7 3.4* 3.6 2.9*  CL 96* 104 108 104  CO2 25 20* 23 22   GLUCOSE 234* 157* 116* 49*  BUN 8 17 20 23   CREATININE 0.84 0.61 0.82 0.75  CALCIUM 9.3 8.9 8.9 8.8*  MG  --  1.4* 1.6* 1.8  PHOS  --  3.5  --   --    GFR: Estimated Creatinine Clearance: 52.2 mL/min (by C-G formula based on SCr of 0.75 mg/dL). Liver Function Tests: Recent Labs  Lab 01/14/19 2348 01/16/19 0421 01/17/19 0945 01/18/19 0240  AST 52* 35 32 31  ALT 40 35 28 29  ALKPHOS 77 63 62 62  BILITOT 0.9 0.6 0.9 0.4  PROT 7.1 6.8 6.8 6.7  ALBUMIN 3.7 3.4* 3.6 3.3*   No results for input(s): LIPASE, AMYLASE in the last 168 hours. No results for input(s): AMMONIA in the last 168 hours. Coagulation Profile: Recent Labs  Lab 01/14/19 2348  INR 1.2   Cardiac Enzymes: No results for input(s): CKTOTAL, CKMB, CKMBINDEX, TROPONINI in the last 168 hours. BNP (last 3 results) No results for input(s): PROBNP in the last 8760 hours. HbA1C: No results for input(s): HGBA1C in the last 72 hours. CBG: Recent Labs  Lab 01/17/19 1141 01/17/19 1611 01/17/19 2126 01/18/19 0748 01/18/19 1118  GLUCAP  70 237* 347* 83 268*   Lipid Profile: No results for input(s): CHOL, HDL, LDLCALC, TRIG, CHOLHDL, LDLDIRECT in the last 72 hours. Thyroid Function Tests: No results for input(s): TSH, T4TOTAL, FREET4, T3FREE, THYROIDAB in the last 72 hours. Anemia Panel: No results for input(s): VITAMINB12, FOLATE, FERRITIN, TIBC, IRON, RETICCTPCT in the last 72 hours. Urine analysis:    Component Value Date/Time   COLORURINE YELLOW 01/14/2019 2348   APPEARANCEUR HAZY (A) 01/14/2019 2348   LABSPEC 1.020 01/14/2019 2348   PHURINE 5.0 01/14/2019 2348   GLUCOSEU 50 (A) 01/14/2019 2348   GLUCOSEU NEGATIVE 10/08/2012 1148   HGBUR NEGATIVE 01/14/2019 2348   BILIRUBINUR NEGATIVE 01/14/2019 2348   BILIRUBINUR negative 12/18/2017 1613   KETONESUR 5 (A) 01/14/2019 2348   PROTEINUR 100 (A) 01/14/2019 2348   UROBILINOGEN 0.2 12/18/2017 1613   UROBILINOGEN 0.2 11/01/2014 2113   NITRITE POSITIVE (A)  01/14/2019 2348   LEUKOCYTESUR TRACE (A) 01/14/2019 2348   Recent Results (from the past 240 hour(s))  Urine culture     Status: Abnormal   Collection Time: 01/14/19 12:37 AM   Specimen: In/Out Cath Urine  Result Value Ref Range Status   Specimen Description IN/OUT CATH URINE  Final   Special Requests   Final    NONE Performed at [facility], [place] [address] [place], [place] [address]    Culture >=100,000 COLONIES/mL KLEBSIELLA PNEUMONIAE (A)  Final   Report Status 01/17/2019 FINAL  Final   Organism ID, Bacteria KLEBSIELLA PNEUMONIAE (A)  Final      Susceptibility   Klebsiella pneumoniae - MIC*    AMPICILLIN RESISTANT Resistant     CEFAZOLIN <=4 SENSITIVE Sensitive     CEFTRIAXONE <=1 SENSITIVE Sensitive     CIPROFLOXACIN <=0.25 SENSITIVE Sensitive     GENTAMICIN <=1 SENSITIVE Sensitive     IMIPENEM 1 SENSITIVE Sensitive     NITROFURANTOIN 64 INTERMEDIATE Intermediate     TRIMETH/SULFA <=20 SENSITIVE Sensitive     AMPICILLIN/SULBACTAM <=2 SENSITIVE Sensitive     PIP/TAZO <=4 SENSITIVE Sensitive     Extended ESBL NEGATIVE Sensitive     * >=100,000 COLONIES/mL KLEBSIELLA PNEUMONIAE  Blood Culture (routine x 2)     Status: None (Preliminary result)   Collection Time: 01/14/19 11:48 PM   Specimen: BLOOD RIGHT HAND  Result Value Ref Range Status   Specimen Description BLOOD RIGHT HAND  Final   Special Requests   Final    BOTTLES DRAWN AEROBIC AND ANAEROBIC Blood Culture adequate volume   Culture   Final    NO GROWTH 3 DAYS Performed at [facility], [address], [place], [place] [address]    Report Status PENDING  Incomplete  Blood Culture (routine x 2)     Status: None (Preliminary result)   Collection Time: 01/14/19 11:49 PM   Specimen: BLOOD RIGHT FOREARM  Result Value Ref Range Status   Specimen Description BLOOD RIGHT FOREARM  Final   Special Requests   Final    BOTTLES DRAWN AEROBIC AND ANAEROBIC Blood Culture adequate volume   Culture   Final    NO  GROWTH 3 DAYS Performed at [facility], [place] [address] [place], [place] [address]    Report Status PENDING  Incomplete      Radiology Studies: No results found.  Scheduled Meds: . apixaban  5 mg Oral BID  . aspirin EC  81 mg Oral Daily  . [START ON 01/19/2019] cephALEXin  500 mg Oral Q12H  .  dexamethasone (DECADRON) injection  6 mg Intravenous Q24H  . DULoxetine  60 mg Oral Daily  . insulin aspart  0-20 Units Subcutaneous TID WC  . insulin aspart  0-5 Units Subcutaneous QHS  . insulin aspart  4 Units Subcutaneous TID WC  . insulin glargine  25 Units Subcutaneous BID  . levothyroxine  112 mcg Oral Daily  . potassium chloride  40 mEq Oral BID  . pravastatin  20 mg Oral q1800   Continuous Infusions: . cefTRIAXone (ROCEPHIN)  IV Stopped (01/17/19 2204)  . remdesivir 100 mg in NS 250 mL 100 mg (01/18/19 1255)     LOS: 3 days   Time spent: 35 minutes.  Patrecia Pour, MD [facility] [email or web address] 01/18/2019, 1:51 PM

## 2019-01-18 NOTE — Plan of Care (Signed)
  Problem: Education: Goal: Knowledge of risk factors and measures for prevention of condition will improve Outcome: Progressing   Problem: Coping: Goal: Psychosocial and spiritual needs will be supported Outcome: Progressing   Problem: Respiratory: Goal: Will maintain a patent airway Outcome: Progressing Goal: Complications related to the disease process, condition or treatment will be avoided or minimized Outcome: Progressing   

## 2019-01-18 NOTE — Progress Notes (Signed)
CBG 498. Page sent to notify Dr. Bonner Puna. He called back and requested I only administer the max sliding scale (20 units) and the meal coverage of 4 units. Pt is very brittle and her glucose level does drop significantly overnight. Will administer a total of 24 units per MD order.

## 2019-01-19 DIAGNOSIS — G9341 Metabolic encephalopathy: Secondary | ICD-10-CM

## 2019-01-19 LAB — MAGNESIUM: Magnesium: 2 mg/dL (ref 1.7–2.4)

## 2019-01-19 LAB — GLUCOSE, CAPILLARY
Glucose-Capillary: 213 mg/dL — ABNORMAL HIGH (ref 70–99)
Glucose-Capillary: 111 mg/dL — ABNORMAL HIGH (ref 70–99)
Glucose-Capillary: 139 mg/dL — ABNORMAL HIGH (ref 70–99)
Glucose-Capillary: 390 mg/dL — ABNORMAL HIGH (ref 70–99)
Glucose-Capillary: 543 mg/dL (ref 70–99)
Glucose-Capillary: 469 mg/dL — ABNORMAL HIGH (ref 70–99)

## 2019-01-19 LAB — COMPREHENSIVE METABOLIC PANEL
ALT: 26 U/L (ref 0–44)
AST: 24 U/L (ref 15–41)
Albumin: 3.4 g/dL — ABNORMAL LOW (ref 3.5–5.0)
Alkaline Phosphatase: 72 U/L (ref 38–126)
Anion gap: 13 (ref 5–15)
BUN: 17 mg/dL (ref 8–23)
CO2: 23 mmol/L (ref 22–32)
Calcium: 8.8 mg/dL — ABNORMAL LOW (ref 8.9–10.3)
Chloride: 103 mmol/L (ref 98–111)
Creatinine, Ser: 0.56 mg/dL (ref 0.44–1.00)
GFR calc Af Amer: >60 mL/min (ref >60)
GFR calc non Af Amer: >60 mL/min (ref >60)
Glucose, Bld: 92 mg/dL (ref 70–99)
Potassium: 4.3 mmol/L (ref 3.5–5.1)
Sodium: 139 mmol/L (ref 135–145)
Total Bilirubin: 0.5 mg/dL (ref 0.3–1.2)
Total Protein: 6.5 g/dL (ref 6.5–8.1)

## 2019-01-19 MED ORDER — INSULIN ASPART 100 UNIT/ML ~~LOC~~ SOLN
0.0000 [IU] | Freq: Three times a day (TID) | SUBCUTANEOUS | Status: DC
  Administered 2019-01-19: 9 [IU] via SUBCUTANEOUS
  Administered 2019-01-20: 7 [IU] via SUBCUTANEOUS
  Administered 2019-01-20: 9 [IU] via SUBCUTANEOUS
  Administered 2019-01-20: 2 [IU] via SUBCUTANEOUS

## 2019-01-19 MED ORDER — INSULIN ASPART 100 UNIT/ML ~~LOC~~ SOLN: 0.0000 [IU] | Freq: Three times a day (TID) | SUBCUTANEOUS | Status: DC

## 2019-01-19 MED ORDER — INSULIN ASPART 100 UNIT/ML ~~LOC~~ SOLN
10.0000 [IU] | Freq: Once | SUBCUTANEOUS | Status: AC
  Administered 2019-01-19: 10 [IU] via SUBCUTANEOUS

## 2019-01-19 MED ORDER — INSULIN GLARGINE 100 UNIT/ML ~~LOC~~ SOLN
8.0000 [IU] | Freq: Every day | SUBCUTANEOUS | Status: DC
  Administered 2019-01-20: 8 [IU] via SUBCUTANEOUS
  Filled 2019-01-19 (×2): qty 0.08

## 2019-01-19 MED ORDER — INFLUENZA VAC A&B SA ADJ QUAD 0.5 ML IM PRSY
0.5000 mL | PREFILLED_SYRINGE | INTRAMUSCULAR | Status: AC
  Administered 2019-01-20: 0.5 mL via INTRAMUSCULAR
  Filled 2019-01-19: qty 0.5

## 2019-01-19 MED ORDER — INSULIN ASPART 100 UNIT/ML ~~LOC~~ SOLN
0.0000 [IU] | Freq: Every day | SUBCUTANEOUS | Status: DC
  Administered 2019-01-20: 5 [IU] via SUBCUTANEOUS

## 2019-01-19 NOTE — Progress Notes (Signed)
Occupational Therapy Treatment Patient Details Name: Jaclyn [name] MRN: [id number] DOB: 03/26/1935 Today's Date: 01/19/2019    History of present illness 83 y.o. female with medical history significant for paroxysmal atrial fibrillation on Eliquis, history of CVA, hypertension, poorly controlled insulin-dependent diabetes mellitus, recurrent UTIs, and hypothyroidism, now presenting to the emergency department with 2 days of lethargy, confusion, and malodorous urine.  Patient is brought in by her son who is concerned for recurrent UTI, noting that she has developed some more fatigue and confusion in the setting of a UTI previousl  COVID-19 antigen test is positive.   OT comments  Pt making gradual progress towards OT goals. Pt remains confused and with decreased safety awareness requiring cueing throughout functional task/mobility completion. Pt performing functional transfers and mobilizing a short distance in room with minA (HHA, approx 6'). VSS on RA throughout. Once seated in recliner pt engaging in grooming ADL with setup/supervision assist. Given pt's continued cognitive and functional impairments continue to recommend SNF level therapies at this time (pending family's ability to provide necessary assist at home). Will continue per POC.    Follow Up Recommendations  SNF;Supervision/Assistance - 24 hour(pending progress)    Equipment Recommendations  3 in 1 bedside commode          Precautions / Restrictions Precautions Precautions: Fall Precaution Comments: tele safety sitter, posterior bias Restrictions Weight Bearing Restrictions: No       Mobility Bed Mobility Overal bed mobility: Needs Assistance Bed Mobility: Rolling;Sidelying to Sit Rolling: Min guard Sidelying to sit: Mod assist       General bed mobility comments: assist for trunk elevation  Transfers Overall transfer level: Needs assistance Equipment used: 1 person hand held assist Transfers: Sit to/from  Stand Sit to Stand: Min assist Stand pivot transfers: Min assist       General transfer comment: boosting and steadying assist from EOB, mod-max cues for safety/instruction while taking steps to recliner, pt initially reaching out for recliner armrest prematurely    Balance Overall balance assessment: Needs assistance Sitting-balance support: Feet supported;Bilateral upper extremity supported Sitting balance-Leahy Scale: Fair Sitting balance - Comments: min guard assist EOB, pt reliant on at least single UE support Postural control: Posterior lean Standing balance support: Bilateral upper extremity supported Standing balance-Leahy Scale: Poor Standing balance comment: needs external assist in standing                            ADL either performed or assessed with clinical judgement   ADL Overall ADL's : Needs assistance/impaired     Grooming: Set up;Min guard;Sitting;Brushing hair                               Functional mobility during ADLs: Minimal assistance;+2 for safety/equipment(HHA) General ADL Comments: pt remains limited due weakness, impaired cognition     Vision       Perception     Praxis      Cognition Arousal/Alertness: Awake/alert Behavior During Therapy: Impulsive Overall Cognitive Status: Impaired/Different from baseline Area of Impairment: Orientation;Memory;Attention;Following commands;Awareness;Safety/judgement;Problem solving                 Orientation Level: Disoriented to;Time;Situation Current Attention Level: Sustained Memory: Decreased recall of precautions;Decreased short-term memory Following Commands: Follows one step commands inconsistently;Follows one step commands with increased time Safety/Judgement: Decreased awareness of safety;Decreased awareness of deficits Awareness: Emergent Problem Solving: Difficulty sequencing;Requires verbal cues;Requires tactile  cues General Comments: pt requires frequent  cueing for safety and also as pt is [facility]; oriented to self, and hospital "[facility]", given cues able to tell me the correct month (month with Christmas), had initially stated it was June        Exercises     Shoulder Instructions       General Comments      Pertinent Vitals/ Pain       Pain Assessment: No/denies pain  Home Living                                          Prior Functioning/Environment              Frequency  Min 2X/week        Progress Toward Goals  OT Goals(current goals can now be found in the care plan section)  Progress towards OT goals: Progressing toward goals  Acute Rehab OT Goals Patient Stated Goal: none stated OT Goal Formulation: With family Time For Goal Achievement: 01/30/19 Potential to Achieve Goals: Good ADL Goals Pt Will Perform Grooming: with set-up;with supervision;sitting Pt Will Perform Upper Body Bathing: with supervision;with set-up;sitting Pt Will Transfer to Toilet: with min assist;ambulating;bedside commode Additional ADL Goal #1: Pt will demosntrate emergent awareness during ADL task in non-distracting environment  Plan Discharge plan remains appropriate    Co-evaluation                 AM-PAC OT "6 Clicks" Daily Activity     Outcome Measure   Help from another person eating meals?: A Little Help from another person taking care of personal grooming?: A Little Help from another person toileting, which includes using toliet, bedpan, or urinal?: A Lot Help from another person bathing (including washing, rinsing, drying)?: A Lot Help from another person to put on and taking off regular upper body clothing?: A Lot Help from another person to put on and taking off regular lower body clothing?: A Lot 6 Click Score: 14    End of Session    OT Visit Diagnosis: Unsteadiness on feet (R26.81);Other abnormalities of gait and mobility (R26.89);Muscle weakness (generalized) (M62.81);Other symptoms  and signs involving cognitive function;Low vision, both eyes (H54.2)   Activity Tolerance Patient tolerated treatment well   Patient Left in chair;with call bell/phone within reach;with chair alarm set;with nursing/sitter in room   Nurse Communication Mobility status        Time: [id number] OT Time Calculation (min): 19 min  Charges: OT General Charges $OT Visit: 1 Visit OT Treatments $Self Care/Home Management : 8-22 mins  Lou Cal, OT Supplemental Rehabilitation Services Pager [phone] Office [phone]    Raymondo Band 01/19/2019, 1:39 PM

## 2019-01-19 NOTE — Progress Notes (Signed)
PROGRESS NOTE    Jaclyn [name]  [id number] DOB: 1935-06-09 DOA: 01/14/2019 PCP: Jinny Sanders, MD    Brief Narrative:  83 year old female who presented with lethargy confusion and foul-smelling urine.  She does have significant past medical history for paroxysmal atrial fibrillation, history of CVA, hypertension, poorly controlled type 2 diabetes mellitus, recurrent urine tract infection and hypothyroidism.  Patient was noted to have lethargy, confusion and malodorous urine for about 2 days.  Patient was found very weak and deconditioned, difficulty ambulating.  On her initial physical examination her temperature was 38.2 C, oxygen saturation 88% on room air, blood pressure 144/71, heart rate 86, respiratory rate 22, patient had increased work of breathing, heart S1-S2 present rhythmic, abdomen soft nontender, no lower extremity edema.  Urinalysis more than 50 white cells, many bacteria, specific gravity 1.020, 100 protein.  Her chest radiograph had a left lower lobe chronic opacity, attributed to pericardial fat.  Patient was admitted to the hospital working diagnosis of acute hypoxic respiratory failure due to SARS COVID-19 viral pneumonia, complicated by urine tract infection, and metabolic encephalopathy.  Assessment & Plan:   Principal Problem:   COVID-19 virus infection Active Problems:   Hypothyroidism   PAROXYSMAL ATRIAL FIBRILLATION   History of CVA (cerebrovascular accident)   Type 2 diabetes mellitus with vascular disease ([place])   Acute lower UTI   1. Acute hypoxic respiratory failure due to SARS COVID 19 viral pneumonia. Patient has completed 5 days of Remdesivir on 12/2,   RR:18  Pulse oxymetry:95%  Fi02: 21% room air.   Patient has responded well to medical therapy, will discontinue steroids today. Continue oxymetry monitoring. Continue as needed albuterol. No significant cough or other respiratory symptoms.   2. Acute metabolic encephalopathy. Multifactorial,  related to COVID 19 and urine infection. Today patient is more awake and alert, orientated x3. Will continue supportive medical therapy with as needed haldol, ( last dose was 11/29). Physical therapy evaluation and out of bed to chair tid as tolerated. Discontinue lorazepam for now.   3. Urinary tract infection, Klebsiella (present on admission). Patient tolerating well antibiotic therapy, currently on oral cephalexin.   4. Paroxysmal atrial fibrillation. Continue rate control and anticoagulation with apixaban.   5. Hypokalemia, and hypomagnesemia. Continue to correct electrolytes, follow on renal panel in am.   6. T2DM uncontrolled (HgbA1c 11.2), with steroid induced hyperglycemia. Continue glucose control and monitoring with insulin sliding scale, patient is tolerating po. Fasting glucose 92 this am, at home on metformin and glipizide, will now dc steroids and basal insulin to prevent hypoglycemia.   7. Hypothyroid. Continue with levothyroxine.   8. Hx of CVA with dyslipidemia. Continue aspiring and statin therapy.    DVT prophylaxis: apixaban   Code Status:  full Family Communication:  No family at the bedside  Disposition Plan/ discharge barriers: possible dc in am.   Body mass index is 25.75 kg/m. Malnutrition Type:      Malnutrition Characteristics:      Nutrition Interventions:     RN Pressure Injury Documentation:     Consultants:     Procedures:     Antimicrobials:   Cephalexin     Subjective: This am patient is less agitated, following commands, no dyspnea, no nausea or vomiting. Orientated x3.   Objective: Vitals:   01/18/19 1523 01/18/19 2043 01/19/19 0442 01/19/19 0803  BP:  (!) 153/87 (!) 152/78 125/63  Pulse:  79 76 70  Resp:  20 17 15   Temp: 98.7 F (37.1 C)  97.6 F (36.4 C) 98.3 F (36.8 C)  TempSrc: Oral  Axillary Oral  SpO2:  98% 99% 97%  Weight:      Height:        Intake/Output Summary (Last 24 hours) at 01/19/2019 0940  Last data filed at 01/19/2019 0911 Gross per 24 hour  Intake 1320 ml  Output 1900 ml  Net -580 ml   Filed Weights   01/14/19 2351  Weight: 68 kg    Examination:   General: Not in pain or dyspnea. Deconditioned  Neurology: Awake and alert, non focal  E ENT: mild pallor, no icterus, oral mucosa moist Cardiovascular: No JVD. S1-S2 present, rhythmic, no gallops, rubs, or murmurs. No lower extremity edema. Pulmonary: positive breath sounds bilaterally. Gastrointestinal. Abdomen with no organomegaly, non tender, no rebound or guarding Skin. No rashes Musculoskeletal: no joint deformities     Data Reviewed: I have personally reviewed following labs and imaging studies  CBC: Recent Labs  Lab 01/14/19 2348 01/16/19 0421 01/18/19 0240  WBC 6.1 7.4 13.3*  NEUTROABS 4.5  --   --   HGB 12.4 11.5* 12.1  HCT 36.6 34.4* 36.4  MCV 90.8 91.7 91.0  PLT 185 215 [id number]   Basic Metabolic Panel: Recent Labs  Lab 01/14/19 2348 01/16/19 0421 01/17/19 0945 01/18/19 0240 01/19/19 0255  NA 135 135 141 140 139  K 3.7 3.4* 3.6 2.9* 4.3  CL 96* 104 108 104 103  CO2 25 20* 23 22 23   GLUCOSE 234* 157* 116* 49* 92  BUN 8 17 20 23 17   CREATININE 0.84 0.61 0.82 0.75 0.56  CALCIUM 9.3 8.9 8.9 8.8* 8.8*  MG  --  1.4* 1.6* 1.8 2.0  PHOS  --  3.5  --   --   --    GFR: Estimated Creatinine Clearance: 52.2 mL/min (by C-G formula based on SCr of 0.56 mg/dL). Liver Function Tests: Recent Labs  Lab 01/14/19 2348 01/16/19 0421 01/17/19 0945 01/18/19 0240 01/19/19 0255  AST 52* 35 32 31 24  ALT 40 35 28 29 26   ALKPHOS 77 63 62 62 72  BILITOT 0.9 0.6 0.9 0.4 0.5  PROT 7.1 6.8 6.8 6.7 6.5  ALBUMIN 3.7 3.4* 3.6 3.3* 3.4*   No results for input(s): LIPASE, AMYLASE in the last 168 hours. No results for input(s): AMMONIA in the last 168 hours. Coagulation Profile: Recent Labs  Lab 01/14/19 2348  INR 1.2   Cardiac Enzymes: No results for input(s): CKTOTAL, CKMB, CKMBINDEX, TROPONINI in  the last 168 hours. BNP (last 3 results) No results for input(s): PROBNP in the last 8760 hours. HbA1C: No results for input(s): HGBA1C in the last 72 hours. CBG: Recent Labs  Lab 01/18/19 0748 01/18/19 1118 01/18/19 1650 01/19/19 0528 01/19/19 0808  GLUCAP 83 268* 498* 111* 139*   Lipid Profile: No results for input(s): CHOL, HDL, LDLCALC, TRIG, CHOLHDL, LDLDIRECT in the last 72 hours. Thyroid Function Tests: No results for input(s): TSH, T4TOTAL, FREET4, T3FREE, THYROIDAB in the last 72 hours. Anemia Panel: No results for input(s): VITAMINB12, FOLATE, FERRITIN, TIBC, IRON, RETICCTPCT in the last 72 hours.    Radiology Studies: I have reviewed all of the imaging during this hospital visit personally     Scheduled Meds: . apixaban  5 mg Oral BID  . aspirin EC  81 mg Oral Daily  . cephALEXin  500 mg Oral Q12H  . dexamethasone (DECADRON) injection  6 mg Intravenous Q24H  . DULoxetine  60 mg Oral Daily  .  insulin aspart  0-20 Units Subcutaneous TID WC  . insulin aspart  0-5 Units Subcutaneous QHS  . insulin aspart  4 Units Subcutaneous TID WC  . insulin glargine  25 Units Subcutaneous BID  . levothyroxine  112 mcg Oral Daily  . pravastatin  20 mg Oral q1800   Continuous Infusions: . remdesivir 100 mg in NS 250 mL 100 mg (01/18/19 1255)     LOS: 4 days        Aylissa Heinemann Gerome Apley, MD

## 2019-01-19 NOTE — Progress Notes (Signed)
Md. Vanita Ingles was made aware re: patient's behavior. Pt constantly getting out of chair/ bed, not following instructions( tele sitter), very confused. Pt is high risk for fall. 1 to 1 observation was ordered.

## 2019-01-19 NOTE — Progress Notes (Addendum)
Inpatient Diabetes Program Recommendations  AACE/ADA: New Consensus Statement on Inpatient Glycemic Control (2015)  Target Ranges:  Prepandial:   less than 140 mg/dL      Peak postprandial:   less than 180 mg/dL (1-2 hours)      Critically ill patients:  140 - 180 mg/dL   Lab Results  Component Value Date   GLUCAP 139 (H) 01/19/2019   HGBA1C 11.2 (H) 01/15/2019    Review of Glycemic Control Results for DANICE, STUVE (MRN [id number]) as of 01/19/2019 10:27  Ref. Range 01/18/2019 07:48 01/18/2019 11:18 01/18/2019 16:50 01/19/2019 05:28 01/19/2019 08:08  Glucose-Capillary Latest Ref Range: 70 - 99 mg/dL 83 268 (H) 498 (H) 111 (H) 139 (H)  Results for VASHANTI, RANDLETT (MRN [id number]) as of 01/19/2019 12:58  Ref. Range 01/19/2019 08:08 01/19/2019 11:44  Glucose-Capillary Latest Ref Range: 70 - 99 mg/dL 139 (H) 390 (H)   Diabetes history: DM 2 Outpatient Diabetes medications: Lantus 90 units, Metformin 500 mg bid, Glucotrol 10 mg daily  Current orders for Inpatient glycemic control:  Lantus 25 units bid Novolog 0-20 units tid + hs Novolog 4 units tid meal coverage  Decadron 6 mg Q24 hours BUN/Creat:  17/0.56 No PO supplementation currently  Inpatient Diabetes Program Recommendations:    Hyperglycemia yesterday, however pt did not get meal coverage at breakfast for a glucose of 83 (parameters are to give if glucose is >80 mg/dl.). Glucose increased to 268 by lunchtime and significantly elevated at dinner time at 498 mg/dl.  RN Staff please give meal coverage using parameters in insulin orders.  Consider increasing Novolog Meal coverage to Novolog 8 units tid .  Spoke with patient's son, [name], in regards to DM control. Pt lives with him and his sister. Patient is in charge of her own medications. To his knowledge pt takes her medication everyday and eats regularly. Patient sees a PCP for glucose control, Dr. Eliezer Lofts, however son reports that not a lot of attention is placed on her DM.  Discussed possibly getting referral to Endocrinology for glucose management. Discussed A1c level, 11.2% this admission. Discussed glucose and A1c goals.  Son reports wanting to take pt home when discharged as his sister and daughter can watch pt during the day. Discussed checking glucose frequently. Also discussed when to call the PCP for insulin adjustments.  Son is off from work today. Can be reached on cell phone number.   Thanks,  Tama Headings RN, MSN, BC-ADM Inpatient Diabetes Coordinator Team Pager [phone] (8a-5p)

## 2019-01-19 NOTE — Plan of Care (Signed)
  Problem: Respiratory: Goal: Will maintain a patent airway Outcome: Progressing Goal: Complications related to the disease process, condition or treatment will be avoided or minimized Outcome: Progressing   

## 2019-01-20 LAB — CULTURE, BLOOD (ROUTINE X 2)
Culture: NO GROWTH
Culture: NO GROWTH
Special Requests: ADEQUATE
Special Requests: ADEQUATE

## 2019-01-20 LAB — BASIC METABOLIC PANEL
Anion gap: 11 (ref 5–15)
BUN: 27 mg/dL — ABNORMAL HIGH (ref 8–23)
CO2: 23 mmol/L (ref 22–32)
Calcium: 8.9 mg/dL (ref 8.9–10.3)
Chloride: 100 mmol/L (ref 98–111)
Creatinine, Ser: 0.63 mg/dL (ref 0.44–1.00)
GFR calc Af Amer: >60 mL/min (ref >60)
GFR calc non Af Amer: >60 mL/min (ref >60)
Glucose, Bld: 222 mg/dL — ABNORMAL HIGH (ref 70–99)
Potassium: 3.7 mmol/L (ref 3.5–5.1)
Sodium: 134 mmol/L — ABNORMAL LOW (ref 135–145)

## 2019-01-20 LAB — GLUCOSE, CAPILLARY
Glucose-Capillary: 200 mg/dL — ABNORMAL HIGH (ref 70–99)
Glucose-Capillary: 313 mg/dL — ABNORMAL HIGH (ref 70–99)
Glucose-Capillary: 405 mg/dL — ABNORMAL HIGH (ref 70–99)

## 2019-01-20 MED ORDER — CEPHALEXIN 500 MG PO CAPS: 500.0000 mg | ORAL_CAPSULE | Freq: Two times a day (BID) | ORAL | 0 refills | Status: DC

## 2019-01-20 NOTE — Plan of Care (Signed)
  Problem: Education: Goal: Knowledge of risk factors and measures for prevention of condition will improve 01/20/2019 1726 by Angela Adam, RN Outcome: Adequate for Discharge 01/20/2019 1058 by Angela Adam, RN Outcome: Adequate for Discharge   Problem: Coping: Goal: Psychosocial and spiritual needs will be supported 01/20/2019 1726 by Angela Adam, RN Outcome: Adequate for Discharge 01/20/2019 1058 by Angela Adam, RN Outcome: Adequate for Discharge   Problem: Respiratory: Goal: Will maintain a patent airway 01/20/2019 1726 by Angela Adam, RN Outcome: Adequate for Discharge 01/20/2019 1058 by Angela Adam, RN Outcome: Adequate for Discharge Goal: Complications related to the disease process, condition or treatment will be avoided or minimized 01/20/2019 1726 by Angela Adam, RN Outcome: Adequate for Discharge 01/20/2019 1058 by Angela Adam, RN Outcome: Adequate for Discharge

## 2019-01-20 NOTE — Plan of Care (Signed)

## 2019-01-20 NOTE — TOC Transition Note (Signed)
Transition of Care [facility]) - CM/SW Discharge Note   Patient Details  Name: Jaclyn [name] MRN: [id number] Date of Birth: [birth year]  [facility] CM/SW Contact:  Geralynn Ochs, LCSW Phone Number: 01/20/2019, 2:01 PM   Clinical Narrative:   Patient has been set up with home health through [place], confirmed with [facility]. Patient equipment to be delivered, spoke with Zack with [employer] and AC to deliver to room. RN to alert family when patient is ready to be picked up. No further CSW needs at this time.    Final next level of care: [place] Barriers to Discharge: Barriers Resolved   Patient Goals and CMS Choice Patient states their goals for this hospitalization and ongoing recovery are:: Patient unable to participate in goal setting due to confusion.   Choice offered to / list presented to : Adult Children  Discharge Placement                Patient to be transferred to facility by: Family car Name of family member notified: Annia Friendly Patient and family notified of of transfer: 01/20/19  Discharge Plan and Services                DME Arranged: 3-N-1, [place] rolling DME Agency: [employer] Date DME Agency Contacted: 01/20/19 Time DME Agency Contacted: 58 Representative spoke with at DME Agency: Zack HH Arranged: PT, OT, RN [place] Agency: [place] Date [place]: 01/20/19 Time [place]: 1143 Representative spoke with at [place]: [place] ([place]) Interventions     Readmission Risk Interventions No flowsheet data found.

## 2019-01-20 NOTE — Discharge Summary (Addendum)
Physician Discharge Summary  Jaclyn [name] [id number] DOB: 1935-05-14 DOA: 01/14/2019  PCP: Jinny Sanders, MD  Admit date: 01/14/2019 Discharge date: 01/20/2019  Admitted From: Home  Disposition:  Home   Recommendations for Outpatient Follow-up and new medication changes:  1. Follow up with Dr. Diona Browner in 2 weeks.  2. Continue self quarantine for 2 weeks, use a mask in public and maintain physical distancing.  3. Continue 2 more days of antibiotic therapy with cephalexin.   Home Health: yes   Equipment/Devices: no    Discharge Condition: stable  CODE STATUS: full  Diet recommendation: heart healthy and diabetic prudent.   Brief/Interim Summary: 83 year old female who presented with lethargy confusion and foul-smelling urine.  She does have significant past medical history for paroxysmal atrial fibrillation, history of CVA, hypertension, poorly controlled type 2 diabetes mellitus, recurrent urine tract infection and hypothyroidism.  Patient was noted to have lethargy, confusion and malodorous urine for about 2 days.  Patient was found very weak and deconditioned, difficulty ambulating.  On her initial physical examination her temperature was 38.2 C, oxygen saturation 88% on room air, blood pressure 144/71, heart rate 86, respiratory rate 22, patient had increased work of breathing, heart S1-S2 present rhythmic, abdomen soft nontender, no lower extremity edema.  Sodium 135, potassium 3.7, chloride 96, bicarb 25, glucose 234, creatinine 0.84, BUN 8, white count 6.1, hemoglobin 12.4, hematocrit 36.6, platelets 185.  SARS COVID-19 positive (antigen).  Urinalysis more than 50 white cells, many bacteria, specific gravity 1.020, 100 protein.  Her chest radiograph had a left lower lobe chronic opacity, attributed to pericardial fat.  EKG 90 bpm, right axis deviation, normal intervals, sinus rhythm, poor R wave progression, no ST segment or T wave changes.  Patient was admitted to the hospital  working diagnosis of acute hypoxic respiratory failure due to SARS COVID-19 viral pneumonia, complicated by urine tract infection, and metabolic encephalopathy.  1.  Acute hypoxic respiratory failure due to SARS COVID-19 viral pneumonia.  Patient was admitted to the medical ward, she received supplemental oxygen per nasal cannula, medical therapy with remdesivir and systemic corticosteroids.  She received bronchodilators and antitussive agents.  She responded well to medical therapy, her oximetry at discharge is 95 to 96% on room air.  She had no significant elevation of inflammatory markers.  2.  Acute metabolic encephalopathy in the setting of anxiety/depression..  Likely multifactorial, related to COVID-19 and urinary tract infection.  Patient received supportive medical therapy, she did require antipsychotic therapy as needed with Haldol.  Patient was seen by physical therapy, her mentation slowly improved back to its baseline.  Physical therapy recommended SNF for 24-hour supervision.  Continue Cymbalta.  3.  Urine tract infection, Klebsiella, present on admission.  Patient received antibiotic therapy with cephalosporins with good toleration.  At home will resume prophylactic Bactrim.  4.  Paroxysmal atrial fibrillation.  Patient remains sinus rhythm, continue anticoagulation with apixaban.  5.  Hypokalemia and hypomagnesemia.  Electrolytes were corrected with good toleration.  Her kidney function remained stable.  6.  Type 2 diabetes mellitus, uncontrolled, hemoglobin [id number], complicated by steroid-induced hyperglycemia.  Dyslipidemia.  Patient had aggressive therapy with insulin, at discharge she will resume metformin and glipizide, steroids have been discontinued.  Continue statin therapy.  7.  Hypothyroid.  Continue levothyroxine.  8.  History of CVA and dyslipidemia.  Continue aspirin and statin therapy.  9.  Chronic anemia.  Multifactorial, remained stable.  Discharge Diagnoses:   Principal Problem:   COVID-19 virus infection  Active Problems:   Hypothyroidism   PAROXYSMAL ATRIAL FIBRILLATION   History of CVA (cerebrovascular accident)   Type 2 diabetes mellitus with vascular disease ([place])   Acute lower UTI    Discharge Instructions   Allergies as of 01/20/2019      Reactions   Naproxen Sodium Other (See Comments)   Fever/aches and pains   Statins Other (See Comments)   myalgias   Sulfonamide Derivatives Hives, Itching   Latex Itching, Rash   Shellfish Allergy Itching, Swelling, Rash   Seafood, shrimp      Medication List    STOP taking these medications   metFORMIN 500 MG tablet Commonly known as: GLUCOPHAGE     TAKE these medications   apixaban 5 MG Tabs tablet Commonly known as: Eliquis Take 1 tablet (5 mg total) by mouth 2 (two) times daily.   aspirin 81 MG EC tablet Take 1 tablet (81 mg total) by mouth daily.   B-D ULTRAFINE III SHORT PEN 31G X 8 MM Misc Generic drug: Insulin Pen Needle USE TO INJECT INSULIN ONCE DAILY. DX: E11.65 What changed: additional instructions   cephALEXin 500 MG capsule Commonly known as: KEFLEX Take 1 capsule (500 mg total) by mouth every 12 (twelve) hours for 2 days.   DULoxetine 30 MG capsule Commonly known as: CYMBALTA Take 2 capsules (60 mg total) by mouth daily.   ezetimibe 10 MG tablet Commonly known as: ZETIA Take 1 tablet (10 mg total) by mouth daily.   glipiZIDE 10 MG tablet Commonly known as: GLUCOTROL Take 1 tablet (10 mg total) by mouth daily before breakfast.   Lantus SoloStar 100 UNIT/ML Solostar Pen Generic drug: Insulin Glargine INJECT 90 UNITS SUBCUTANEOUSLY ONCE DAILY What changed: See the new instructions.   levothyroxine 112 MCG tablet Commonly known as: SYNTHROID Take 1 tablet (112 mcg total) by mouth daily.   pravastatin 20 MG tablet Commonly known as: PRAVACHOL Take 1 tablet (20 mg total) by mouth daily.   trimethoprim 100 MG tablet Commonly known as:  TRIMPEX TAKE 1 TABLET BY MOUTH ONCE DAILY FOR UTI PROPHYLAXSIS What changed:   how much to take  how to take this  when to take this  additional instructions   vitamin B-12 1000 MCG tablet Commonly known as: CYANOCOBALAMIN Take 1 tablet (1,000 mcg total) by mouth daily.   Vitamin D (Ergocalciferol) 1.25 MG (50000 UT) Caps capsule Commonly known as: DRISDOL Take 1 capsule (50,000 Units total) by mouth every 7 (seven) days.       Allergies  Allergen Reactions  . Naproxen Sodium Other (See Comments)    Fever/aches and pains  . Statins Other (See Comments)    myalgias  . Sulfonamide Derivatives Hives and Itching  . Latex Itching and Rash  . Shellfish Allergy Itching, Swelling and Rash    Seafood, shrimp    Consultations:     Procedures/Studies: Dg Chest Port 1 View  Result Date: 01/14/2019 CLINICAL DATA:  Confused, possibly septic EXAM: PORTABLE CHEST 1 VIEW COMPARISON:  Radiograph 11/12/2014 FINDINGS: Streaky atelectatic changes. Attenuation in the left lung base likely corresponds to abundant pericardial fat seen on prior studies. No consolidation, features of edema, pneumothorax, or effusion. The aorta is calcified. The remaining cardiomediastinal contours are unremarkable. No acute osseous or soft tissue abnormality. IMPRESSION: 1. Atelectasis, no other acute cardiopulmonary disease. 2. Attenuation in the left lung base likely corresponds to pericardial fat, unchanged from multiple priors. 3.  Aortic Atherosclerosis (ICD10-I70.0). Electronically Signed   By: Lovena Le  M.D.   On: 01/14/2019 23:32      Procedures:   Subjective: Patient is feeling better, her confusion continue to improve, no nausea or vomiting, no dyspnea. Tolerating po well.   Discharge Exam: Vitals:   01/20/19 0341 01/20/19 0748  BP: (!) 151/78 (!) 158/72  Pulse: 73 70  Resp: 18 16  Temp: 99.2 F (37.3 C) 98.3 F (36.8 C)  SpO2: 95% 96%   Vitals:   01/19/19 2022 01/19/19 2340  01/20/19 0341 01/20/19 0748  BP: (!) 125/58 (!) 149/68 (!) 151/78 (!) 158/72  Pulse: 79 75 73 70  Resp: 19 15 18 16   Temp: 97.6 F (36.4 C) 98.6 F (37 C) 99.2 F (37.3 C) 98.3 F (36.8 C)  TempSrc: Oral Oral Oral Axillary  SpO2: 98% 94% 95% 96%  Weight:      Height:        General: Not in pain or dyspnea. Neurology: Awake and alert, non focal  E ENT: no pallor, no icterus, oral mucosa moist Cardiovascular: No JVD. S1-S2 present, rhythmic, no gallops, rubs, or murmurs. No lower extremity edema. Pulmonary: positive breath sounds bilaterally. Gastrointestinal. Abdomen with, no organomegaly, non tender, no rebound or guarding Skin. No rashes Musculoskeletal: no joint deformities   The results of significant diagnostics from this hospitalization (including imaging, microbiology, ancillary and laboratory) are listed below for reference.     Microbiology: Recent Results (from the past 240 hour(s))  Urine culture     Status: Abnormal   Collection Time: 01/14/19 12:37 AM   Specimen: In/Out Cath Urine  Result Value Ref Range Status   Specimen Description IN/OUT CATH URINE  Final   Special Requests   Final    NONE Performed at [facility], [address], [place], [place] [address]    Culture >=100,000 COLONIES/mL KLEBSIELLA PNEUMONIAE (A)  Final   Report Status 01/17/2019 FINAL  Final   Organism ID, Bacteria KLEBSIELLA PNEUMONIAE (A)  Final      Susceptibility   Klebsiella pneumoniae - MIC*    AMPICILLIN RESISTANT Resistant     CEFAZOLIN <=4 SENSITIVE Sensitive     CEFTRIAXONE <=1 SENSITIVE Sensitive     CIPROFLOXACIN <=0.25 SENSITIVE Sensitive     GENTAMICIN <=1 SENSITIVE Sensitive     IMIPENEM 1 SENSITIVE Sensitive     NITROFURANTOIN 64 INTERMEDIATE Intermediate     TRIMETH/SULFA <=20 SENSITIVE Sensitive     AMPICILLIN/SULBACTAM <=2 SENSITIVE Sensitive     PIP/TAZO <=4 SENSITIVE Sensitive     Extended ESBL NEGATIVE Sensitive     * >=100,000 COLONIES/mL  KLEBSIELLA PNEUMONIAE  Blood Culture (routine x 2)     Status: None   Collection Time: 01/14/19 11:48 PM   Specimen: BLOOD RIGHT HAND  Result Value Ref Range Status   Specimen Description BLOOD RIGHT HAND  Final   Special Requests   Final    BOTTLES DRAWN AEROBIC AND ANAEROBIC Blood Culture adequate volume   Culture   Final    NO GROWTH 5 DAYS Performed at [facility], [address], [place], [place] [address]    Report Status 01/20/2019 FINAL  Final  Blood Culture (routine x 2)     Status: None   Collection Time: 01/14/19 11:49 PM   Specimen: BLOOD RIGHT FOREARM  Result Value Ref Range Status   Specimen Description BLOOD RIGHT FOREARM  Final   Special Requests   Final    BOTTLES DRAWN AEROBIC AND ANAEROBIC Blood Culture adequate volume   Culture  Final    NO GROWTH 5 DAYS Performed at [facility], [place] [address] [place], [place] [address]    Report Status 01/20/2019 FINAL  Final     Labs: BNP (last 3 results) No results for input(s): BNP in the last 8760 hours. Basic Metabolic Panel: Recent Labs  Lab 01/14/19 2348 01/16/19 0421 01/17/19 0945 01/18/19 0240 01/19/19 0255  NA 135 135 141 140 139  K 3.7 3.4* 3.6 2.9* 4.3  CL 96* 104 108 104 103  CO2 25 20* 23 22 23   GLUCOSE 234* 157* 116* 49* 92  BUN 8 17 20 23 17   CREATININE 0.84 0.61 0.82 0.75 0.56  CALCIUM 9.3 8.9 8.9 8.8* 8.8*  MG  --  1.4* 1.6* 1.8 2.0  PHOS  --  3.5  --   --   --    Liver Function Tests: Recent Labs  Lab 01/14/19 2348 01/16/19 0421 01/17/19 0945 01/18/19 0240 01/19/19 0255  AST 52* 35 32 31 24  ALT 40 35 28 29 26   ALKPHOS 77 63 62 62 72  BILITOT 0.9 0.6 0.9 0.4 0.5  PROT 7.1 6.8 6.8 6.7 6.5  ALBUMIN 3.7 3.4* 3.6 3.3* 3.4*   No results for input(s): LIPASE, AMYLASE in the last 168 hours. No results for input(s): AMMONIA in the last 168 hours. CBC: Recent Labs  Lab 01/14/19 2348 01/16/19 0421 01/18/19 0240  WBC 6.1 7.4 13.3*  NEUTROABS 4.5  --   --   HGB 12.4  11.5* 12.1  HCT 36.6 34.4* 36.4  MCV 90.8 91.7 91.0  PLT 185 215 263   Cardiac Enzymes: No results for input(s): CKTOTAL, CKMB, CKMBINDEX, TROPONINI in the last 168 hours. BNP: Invalid input(s): POCBNP CBG: Recent Labs  Lab 01/19/19 0808 01/19/19 1144 01/19/19 1625 01/19/19 2017 01/20/19 0746  GLUCAP 139* 390* 543* 469* 200*   D-Dimer No results for input(s): DDIMER in the last 72 hours. Hgb A1c No results for input(s): HGBA1C in the last 72 hours. Lipid Profile No results for input(s): CHOL, HDL, LDLCALC, TRIG, CHOLHDL, LDLDIRECT in the last 72 hours. Thyroid function studies No results for input(s): TSH, T4TOTAL, T3FREE, THYROIDAB in the last 72 hours.  Invalid input(s): FREET3 Anemia work up No results for input(s): VITAMINB12, FOLATE, FERRITIN, TIBC, IRON, RETICCTPCT in the last 72 hours. Urinalysis    Component Value Date/Time   COLORURINE YELLOW 01/14/2019 2348   APPEARANCEUR HAZY (A) 01/14/2019 2348   LABSPEC 1.020 01/14/2019 2348   PHURINE 5.0 01/14/2019 2348   GLUCOSEU 50 (A) 01/14/2019 2348   GLUCOSEU NEGATIVE 10/08/2012 1148   HGBUR NEGATIVE 01/14/2019 2348   BILIRUBINUR NEGATIVE 01/14/2019 2348   BILIRUBINUR negative 12/18/2017 1613   KETONESUR 5 (A) 01/14/2019 2348   PROTEINUR 100 (A) 01/14/2019 2348   UROBILINOGEN 0.2 12/18/2017 1613   UROBILINOGEN 0.2 11/01/2014 2113   NITRITE POSITIVE (A) 01/14/2019 2348   LEUKOCYTESUR TRACE (A) 01/14/2019 2348   Sepsis Labs Invalid input(s): PROCALCITONIN,  WBC,  LACTICIDVEN Microbiology Recent Results (from the past 240 hour(s))  Urine culture     Status: Abnormal   Collection Time: 01/14/19 12:37 AM   Specimen: In/Out Cath Urine  Result Value Ref Range Status   Specimen Description IN/OUT CATH URINE  Final   Special Requests   Final    NONE Performed at [facility], [address] [place],  [address]    Culture >=100,000 COLONIES/mL KLEBSIELLA PNEUMONIAE (A)  Final   Report Status  01/17/2019 FINAL  Final   Organism ID, Bacteria KLEBSIELLA PNEUMONIAE (A)  Final      Susceptibility   Klebsiella pneumoniae - MIC*    AMPICILLIN RESISTANT Resistant     CEFAZOLIN <=4 SENSITIVE Sensitive     CEFTRIAXONE <=1 SENSITIVE Sensitive     CIPROFLOXACIN <=0.25 SENSITIVE Sensitive     GENTAMICIN <=1 SENSITIVE Sensitive     IMIPENEM 1 SENSITIVE Sensitive     NITROFURANTOIN 64 INTERMEDIATE Intermediate     TRIMETH/SULFA <=20 SENSITIVE Sensitive     AMPICILLIN/SULBACTAM <=2 SENSITIVE Sensitive     PIP/TAZO <=4 SENSITIVE Sensitive     Extended ESBL NEGATIVE Sensitive     * >=100,000 COLONIES/mL KLEBSIELLA PNEUMONIAE  Blood Culture (routine x 2)     Status: None   Collection Time: 01/14/19 11:48 PM   Specimen: BLOOD RIGHT HAND  Result Value Ref Range Status   Specimen Description BLOOD RIGHT HAND  Final   Special Requests   Final    BOTTLES DRAWN AEROBIC AND ANAEROBIC Blood Culture adequate volume   Culture   Final    NO GROWTH 5 DAYS Performed at [facility], [address] [place], [place] [address]    Report Status 01/20/2019 FINAL  Final  Blood Culture (routine x 2)     Status: None   Collection Time: 01/14/19 11:49 PM   Specimen: BLOOD RIGHT FOREARM  Result Value Ref Range Status   Specimen Description BLOOD RIGHT FOREARM  Final   Special Requests   Final    BOTTLES DRAWN AEROBIC AND ANAEROBIC Blood Culture adequate volume   Culture   Final    NO GROWTH 5 DAYS Performed at [facility], [place] [address], [place], [place] [address]    Report Status 01/20/2019 FINAL  Final     Time coordinating discharge: 45 minutes  SIGNED:   Tawni Millers, MD  [facility] 01/20/2019, 8:43 AM

## 2019-01-20 NOTE — Care Management Important Message (Signed)
Important Message  Patient Details  Name: Jaclyn [name] MRN: [id number] Date of Birth: 1935-06-03   Medicare Important Message Given:  Yes - Important Message mailed due to current National Emergency  Verbal consent obtained due to current National Emergency  Relationship to patient: Child Contact Name: Paulett Doser Call Date: 01/20/19  Time: 1057 Phone: [phone] Outcome: Spoke with contact Important Message mailed to: Patient address on file    [place] 01/20/2019, 11:03 AM

## 2019-01-20 NOTE — Discharge Instructions (Signed)
COVID-19 COVID-19 is a respiratory infection that is caused by a virus called severe acute respiratory syndrome coronavirus 2 (SARS-CoV-2). The disease is also known as coronavirus disease or novel coronavirus. In some people, the virus may not cause any symptoms. In others, it may cause a serious infection. The infection can get worse quickly and can lead to complications, such as:  Pneumonia, or infection of the lungs.  Acute respiratory distress syndrome or ARDS. This is fluid build-up in the lungs.  Acute respiratory failure. This is a condition in which there is not enough oxygen passing from the lungs to the body.  Sepsis or septic shock. This is a serious bodily reaction to an infection.  Blood clotting problems.  Secondary infections due to bacteria or fungus. The virus that causes COVID-19 is contagious. This means that it can spread from person to person through droplets from coughs and sneezes (respiratory secretions). What are the causes? This illness is caused by a virus. You may catch the virus by:  Breathing in droplets from an infected person's cough or sneeze.  Touching something, like a table or a doorknob, that was exposed to the virus (contaminated) and then touching your mouth, nose, or eyes. What increases the risk? Risk for infection You are more likely to be infected with this virus if you:  Live in or travel to an area with a COVID-19 outbreak.  Come in contact with a sick person who recently traveled to an area with a COVID-19 outbreak.  Provide care for or live with a person who is infected with COVID-19. Risk for serious illness You are more likely to become seriously ill from the virus if you:  Are 65 years of age or older.  Have a long-term disease that lowers your body's ability to fight infection (immunocompromised).  Live in a nursing home or long-term care facility.  Have a long-term (chronic) disease such as: ? Chronic lung disease, including  chronic obstructive pulmonary disease or asthma ? Heart disease. ? Diabetes. ? Chronic kidney disease. ? Liver disease.  Are obese. What are the signs or symptoms? Symptoms of this condition can range from mild to severe. Symptoms may appear any time from 2 to 14 days after being exposed to the virus. They include:  A fever.  A cough.  Difficulty breathing.  Chills.  Muscle pains.  A sore throat.  Loss of taste or smell. Some people may also have stomach problems, such as nausea, vomiting, or diarrhea. Other people may not have any symptoms of COVID-19. How is this diagnosed? This condition may be diagnosed based on:  Your signs and symptoms, especially if: ? You live in an area with a COVID-19 outbreak. ? You recently traveled to or from an area where the virus is common. ? You provide care for or live with a person who was diagnosed with COVID-19.  A physical exam.  Lab tests, which may include: ? A nasal swab to take a sample of fluid from your nose. ? A throat swab to take a sample of fluid from your throat. ? A sample of mucus from your lungs (sputum). ? Blood tests.  Imaging tests, which may include, X-rays, CT scan, or ultrasound. How is this treated? At present, there is no medicine to treat COVID-19. Medicines that treat other diseases are being used on a trial basis to see if they are effective against COVID-19. Your health care provider will talk with you about ways to treat your symptoms. For most   people, the infection is mild and can be managed at home with rest, fluids, and over-the-counter medicines. Treatment for a serious infection usually takes places in a hospital intensive care unit (ICU). It may include one or more of the following treatments. These treatments are given until your symptoms improve.  Receiving fluids and medicines through an IV.  Supplemental oxygen. Extra oxygen is given through a tube in the nose, a face mask, or a  hood.  Positioning you to lie on your stomach (prone position). This makes it easier for oxygen to get into the lungs.  Continuous positive airway pressure (CPAP) or bi-level positive airway pressure (BPAP) machine. This treatment uses mild air pressure to keep the airways open. A tube that is connected to a motor delivers oxygen to the body.  Ventilator. This treatment moves air into and out of the lungs by using a tube that is placed in your windpipe.  Tracheostomy. This is a procedure to create a hole in the neck so that a breathing tube can be inserted.  Extracorporeal membrane oxygenation (ECMO). This procedure gives the lungs a chance to recover by taking over the functions of the heart and lungs. It supplies oxygen to the body and removes carbon dioxide. Follow these instructions at home: Lifestyle  If you are sick, stay home except to get medical care. Your health care provider will tell you how long to stay home. Call your health care provider before you go for medical care.  Rest at home as told by your health care provider.  Do not use any products that contain nicotine or tobacco, such as cigarettes, e-cigarettes, and chewing tobacco. If you need help quitting, ask your health care provider.  Return to your normal activities as told by your health care provider. Ask your health care provider what activities are safe for you. General instructions  Take over-the-counter and prescription medicines only as told by your health care provider.  Drink enough fluid to keep your urine pale yellow.  Keep all follow-up visits as told by your health care provider. This is important. How is this prevented?  There is no vaccine to help prevent COVID-19 infection. However, there are steps you can take to protect yourself and others from this virus. To protect yourself:   Do not travel to areas where COVID-19 is a risk. The areas where COVID-19 is reported change often. To identify  high-risk areas and travel restrictions, check the CDC travel website: wwwnc.cdc.gov/travel/notices  If you live in, or must travel to, an area where COVID-19 is a risk, take precautions to avoid infection. ? Stay away from people who are sick. ? Wash your hands often with soap and water for 20 seconds. If soap and water are not available, use an alcohol-based hand sanitizer. ? Avoid touching your mouth, face, eyes, or nose. ? Avoid going out in public, follow guidance from your state and local health authorities. ? If you must go out in public, wear a cloth face covering or face mask. ? Disinfect objects and surfaces that are frequently touched every day. This may include:  Counters and tables.  Doorknobs and light switches.  Sinks and faucets.  Electronics, such as phones, remote controls, keyboards, computers, and tablets. To protect others: If you have symptoms of COVID-19, take steps to prevent the virus from spreading to others.  If you think you have a COVID-19 infection, contact your health care provider right away. Tell your health care team that you think you may   have a COVID-19 infection.  Stay home. Leave your house only to seek medical care. Do not use public transport.  Do not travel while you are sick.  Wash your hands often with soap and water for 20 seconds. If soap and water are not available, use alcohol-based hand sanitizer.  Stay away from other members of your household. Let healthy household members care for children and pets, if possible. If you have to care for children or pets, wash your hands often and wear a mask. If possible, stay in your own room, separate from others. Use a different bathroom.  Make sure that all people in your household wash their hands well and often.  Cough or sneeze into a tissue or your sleeve or elbow. Do not cough or sneeze into your hand or into the air.  Wear a cloth face covering or face mask. Where to find more  information  Centers for Disease Control and Prevention: www.cdc.gov/coronavirus/2019-ncov/index.html  World Health Organization: www.who.int/health-topics/coronavirus Contact a health care provider if:  You live in or have traveled to an area where COVID-19 is a risk and you have symptoms of the infection.  You have had contact with someone who has COVID-19 and you have symptoms of the infection. Get help right away if:  You have trouble breathing.  You have pain or pressure in your chest.  You have confusion.  You have bluish lips and fingernails.  You have difficulty waking from sleep.  You have symptoms that get worse. These symptoms may represent a serious problem that is an emergency. Do not wait to see if the symptoms will go away. Get medical help right away. Call your local emergency services (911 in the [place] Do not drive yourself to the hospital. Let the emergency medical personnel know if you think you have COVID-19. Summary  COVID-19 is a respiratory infection that is caused by a virus. It is also known as coronavirus disease or novel coronavirus. It can cause serious infections, such as pneumonia, acute respiratory distress syndrome, acute respiratory failure, or sepsis.  The virus that causes COVID-19 is contagious. This means that it can spread from person to person through droplets from coughs and sneezes.  You are more likely to develop a serious illness if you are 65 years of age or older, have a weak immunity, live in a nursing home, or have chronic disease.  There is no medicine to treat COVID-19. Your health care provider will talk with you about ways to treat your symptoms.  Take steps to protect yourself and others from infection. Wash your hands often and disinfect objects and surfaces that are frequently touched every day. Stay away from people who are sick and wear a mask if you are sick. This information is not intended to replace advice given to you by  your health care provider. Make sure you discuss any questions you have with your health care provider. Document Released: 03/11/2018 Document Revised: 07/01/2018 Document Reviewed: 03/11/2018 Elsevier Patient Education  2020 Elsevier Inc.   Information on my medicine - ELIQUIS (apixaban)  This medication education was reviewed with me or my healthcare representative as part of my discharge preparation.  The pharmacist that spoke with me during my hospital stay was:  Minh Pham, RPH-[name]  Why was Eliquis prescribed for you? Eliquis was prescribed for you to reduce the risk of a blood clot forming that can cause a stroke if you have a medical condition called atrial fibrillation (a type of irregular heartbeat).    What do You need to know about Eliquis ? Take your Eliquis TWICE DAILY - one tablet in the morning and one tablet in the evening with or without food. If you have difficulty swallowing the tablet whole please discuss with your pharmacist how to take the medication safely.  Take Eliquis exactly as prescribed by your doctor and DO NOT stop taking Eliquis without talking to the doctor who prescribed the medication.  Stopping may increase your risk of developing a stroke.  Refill your prescription before you run out.  After discharge, you should have regular check-up appointments with your healthcare provider that is prescribing your Eliquis.  In the future your dose may need to be changed if your kidney function or weight changes by a significant amount or as you get older.  What do you do if you miss a dose? If you miss a dose, take it as soon as you remember on the same day and resume taking twice daily.  Do not take more than one dose of ELIQUIS at the same time to make up a missed dose.  Important Safety Information A possible side effect of Eliquis is bleeding. You should call your healthcare provider right away if you experience any of the following: ? Bleeding from an  injury or your nose that does not stop. ? Unusual colored urine (red or dark brown) or unusual colored stools (red or black). ? Unusual bruising for unknown reasons. ? A serious fall or if you hit your head (even if there is no bleeding).  Some medicines may interact with Eliquis and might increase your risk of bleeding or clotting while on Eliquis. To help avoid this, consult your healthcare provider or pharmacist prior to using any new prescription or non-prescription medications, including herbals, vitamins, non-steroidal anti-inflammatory drugs (NSAIDs) and supplements.  This website has more information on Eliquis (apixaban): http://www.eliquis.com/eliquis/home  

## 2019-01-20 NOTE — Care Management Important Message (Signed)
Important Message  Patient Details  Name: Jaclyn [name] MRN: [id number] Date of Birth: [birth year]   Medicare Important Message Given:  Yes - Important Message mailed due to current National Emergency  Verbal consent obtained due to current National Emergency  Relationship to patient: Child Contact Name: Analayah Lamkins Call Date: 01/20/19  Time: 1057 Phone: [phone] Outcome: No Answer/Busy Important Message mailed to: Patient address on file    Delorse Lek 01/20/2019, 10:58 AM

## 2019-01-20 NOTE — TOC Initial Note (Addendum)
Transition of Care [facility]) - Initial/Assessment Note    Patient Details  Name: Jaclyn [name] MRN: [id number] Date of Birth: 1935-03-19  [facility] CM/SW Contact:    Kirstie Peri, [place] Work Phone Number: 01/20/2019, 9:56 AM  Clinical Narrative:                   MSW Intern attempted to contact Pt's son, there was no response. Granddaughter was contacted and updated that the recommendation is for rehab placement. She was very adamant that she did not want the PT placed in a facility stating "I do not want to lock her up and let her die alone". MSW Intern told the granddaughter that it was a rehab stay, and it would likely not be permanent, but she still did not want placement.    Granddaughter stated that she wanted to call pt and see what Pt wanted to do before making a decision. MSW Intern advised that pt was still confused according to nurse notes. Granddaughter was also advised that client would need 24 hour supervision and assistance for PT to be able to return home safely. MSW Intern noted that [facility] could be arranged if needed and Granddaughter stated that in the past PT has been unwilling to work with [facility]. Granddaughter to follow up with SW on a placement decision. SW will continue to follow.    Addended 10:17 am PT son [name] returned MSW Intern's phone call and agreed with grandaughter that they would prefer a HH option as long as the insurance would cover it. Son did not remember who provided [facility] last time, but it was over two years ago. SW to begin [facility] authorization and DME setup.   Barriers to Discharge: Ship broker, SNF Pending bed offer   Patient Goals and CMS Choice Patient states their goals for this hospitalization and ongoing recovery are:: Patient unable to participate in goal setting due to confusion.   Choice offered to / list presented to : Adult Children  Expected Discharge Plan and Services         Living arrangements for the past  2 months: Single Family Home                                      Prior Living Arrangements/Services Living arrangements for the past 2 months: Single Family Home Lives with:: Adult Children Patient language and need for interpreter reviewed:: Yes        Need for Family Participation in Patient Care: Yes (Comment)     Criminal Activity/Legal Involvement Pertinent to Current Situation/Hospitalization: No - Comment as needed  Activities of Daily Living Home Assistive Devices/Equipment: Cane (specify quad or straight) ADL Screening (condition at time of admission) Patient's cognitive ability adequate to safely complete daily activities?: Yes Is the patient deaf or have difficulty hearing?: Yes Does the patient have difficulty seeing, even when wearing glasses/contacts?: Yes Does the patient have difficulty concentrating, remembering, or making decisions?: No Patient able to express need for assistance with ADLs?: Yes Does the patient have difficulty dressing or bathing?: No Independently performs ADLs?: Yes (appropriate for developmental age) Does the patient have difficulty walking or climbing stairs?: Yes Weakness of Legs: Both(left leg worse) Weakness of Arms/Hands: None  Permission Sought/Granted Permission sought to share information with : Facility Art therapist granted to share information with : Yes, Verbal Permission Granted  Share Information with NAME:  [name] and [place]     Permission granted to share info w Relationship: Son and Psychologist, occupational granted to share info w Contact Information: [phone]  Emotional Assessment Appearance:: Other (Comment Required(Unable to Assess) Attitude/Demeanor/Rapport: Unable to Assess Affect (typically observed): Unable to Assess Orientation: : Oriented to Self Alcohol / Substance Use: Not Applicable Psych Involvement: No (comment)  Admission diagnosis:  Acute cystitis without hematuria  [N30.00] COVID-19 [U07.1] COVID-19 virus infection [U07.1] Patient Active Problem List   Diagnosis Date Noted  . COVID-19 virus infection 01/15/2019  . Acute lower UTI 01/15/2019  . B12 deficiency 01/22/2018  . History of CVA (cerebrovascular accident) 06/20/2017  . Type 2 diabetes mellitus with vascular disease ([place]) 06/20/2017  . Slurred speech   . Stenosis of right carotid artery   . Peripheral edema 07/29/2016  . Decreased alertness 07/07/2016  . Imbalance 04/07/2016  . Intertriginous candidiasis 04/26/2015  . Hard of hearing 04/12/2015  . TIA (transient ischemic attack) 04/10/2015  . History of recurrent UTIs 04/10/2015  . Dysuria 03/09/2015  . Tremor 05/11/2014  . Leg weakness, bilateral 05/11/2014  . Fatty liver 08/04/2013  . Personal history of colonic polyps 10/05/2012  . Esophageal reflux 10/05/2012  . Carotid stenosis, symptomatic, with infarction ([place]) 10/14/2010  . ANXIETY DEPRESSION 01/13/2008  . Essential hypertension, benign 01/13/2008  . PAROXYSMAL ATRIAL FIBRILLATION 01/13/2008  . Intracranial vascular stenosis 01/13/2008  . DIVERTICULOSIS OF COLON 01/13/2008  . Fibromyalgia 01/13/2008  . CHEST PAIN, ATYPICAL 01/13/2008  . Hypothyroidism 04/13/2007  . Poorly controlled diabetes mellitus ([place]) 04/13/2007  . Vitamin D deficiency 04/13/2007  . Hyperlipidemia LDL goal <70 04/13/2007  . DEGENERATIVE JOINT DISEASE 04/13/2007   PCP:  Jinny Sanders, MD Pharmacy:   [facility] [address] [place] [facility] [place] [address] [place] [address] Phone: [phone] Fax: [phone]     Social Determinants of Health (SDOH) Interventions    Readmission Risk Interventions No flowsheet data found.

## 2019-01-20 NOTE — Progress Notes (Signed)
Inpatient Diabetes Program Recommendations  AACE/ADA: New Consensus Statement on Inpatient Glycemic Control (2015)  Target Ranges:  Prepandial:   less than 140 mg/dL      Peak postprandial:   less than 180 mg/dL (1-2 hours)      Critically ill patients:  140 - 180 mg/dL   Lab Results  Component Value Date   GLUCAP 200 (H) 01/20/2019   HGBA1C 11.2 (H) 01/15/2019    Review of Glycemic Control Results for AMONIE, QUEBEDEAUX (MRN [id number]) as of 01/20/2019 08:01  Ref. Range 01/19/2019 08:08 01/19/2019 11:44 01/19/2019 16:25 01/19/2019 20:17 01/20/2019 07:46  Glucose-Capillary Latest Ref Range: 70 - 99 mg/dL 139 (H) 390 (H) 543 (HH) 469 (H) 200 (H)    Diabetes history: DM 2 Outpatient Diabetes medications: Lantus 90 units, Metformin 500 mg bid, Glucotrol 10 mg daily  Current orders for Inpatient glycemic control:  Lantus 8 units qhs Novolog 0-9 units tid + hs   Steroids: none BUN/Creat:  17/0.56 on 12/2 No PO supplementation currently  Inpatient Diabetes Program Recommendations:    Consider adding back Novolog Meal coverage, Novolog 6-8 units tid .  Thanks,  Tama Headings RN, MSN, BC-ADM Inpatient Diabetes Coordinator Team Pager [phone] (8a-5p)

## 2019-01-21 NOTE — Telephone Encounter (Signed)
Spoke with whoever answered phone pt was just discharged from hospital and wanted me to call back later

## 2019-01-22 ENCOUNTER — Other Ambulatory Visit: Payer: Self-pay | Admitting: Family Medicine

## 2019-01-22 MED ORDER — CEPHALEXIN 500 MG PO CAPS: 500.0000 mg | ORAL_CAPSULE | Freq: Two times a day (BID) | ORAL | 0 refills | Status: DC

## 2019-01-22 NOTE — Progress Notes (Signed)
Patient discharged from hospital with UTI. Given iv abx and discharged on keflex (culture sensitive to cefazolin). Nurse stating patient is still having symptoms of uti. Will give 2 more days of abx; however, if fever/chills/ confusion or worsening symptoms will need to go back to ER.   Orma Flaming, MD [place]

## 2019-01-24 ENCOUNTER — Encounter (HOSPITAL_COMMUNITY): Payer: Self-pay

## 2019-01-24 ENCOUNTER — Inpatient Hospital Stay (HOSPITAL_COMMUNITY)
Admission: EM | Admit: 2019-01-24 | Discharge: 2019-01-28 | DRG: 177 | Disposition: A | Discharge status: Home Health | Payer: Medicare Other | Attending: Student | Admitting: Student

## 2019-01-24 ENCOUNTER — Inpatient Hospital Stay (HOSPITAL_COMMUNITY): Payer: Medicare Other

## 2019-01-24 ENCOUNTER — Other Ambulatory Visit: Payer: Self-pay

## 2019-01-24 ENCOUNTER — Emergency Department (HOSPITAL_COMMUNITY): Payer: Medicare Other

## 2019-01-24 ENCOUNTER — Telehealth: Payer: Self-pay

## 2019-01-24 DIAGNOSIS — Z794 Long term (current) use of insulin: Secondary | ICD-10-CM

## 2019-01-24 DIAGNOSIS — E1165 Type 2 diabetes mellitus with hyperglycemia: Secondary | ICD-10-CM | POA: Diagnosis present

## 2019-01-24 DIAGNOSIS — R05 Cough: Secondary | ICD-10-CM

## 2019-01-24 DIAGNOSIS — I480 Paroxysmal atrial fibrillation: Secondary | ICD-10-CM | POA: Diagnosis not present

## 2019-01-24 DIAGNOSIS — Z7982 Long term (current) use of aspirin: Secondary | ICD-10-CM

## 2019-01-24 DIAGNOSIS — Z888 Allergy status to other drugs, medicaments and biological substances status: Secondary | ICD-10-CM

## 2019-01-24 DIAGNOSIS — I10 Essential (primary) hypertension: Secondary | ICD-10-CM | POA: Diagnosis not present

## 2019-01-24 DIAGNOSIS — R509 Fever, unspecified: Secondary | ICD-10-CM

## 2019-01-24 DIAGNOSIS — E1151 Type 2 diabetes mellitus with diabetic peripheral angiopathy without gangrene: Secondary | ICD-10-CM | POA: Diagnosis not present

## 2019-01-24 DIAGNOSIS — Z8673 Personal history of transient ischemic attack (TIA), and cerebral infarction without residual deficits: Secondary | ICD-10-CM

## 2019-01-24 DIAGNOSIS — E1159 Type 2 diabetes mellitus with other circulatory complications: Secondary | ICD-10-CM

## 2019-01-24 DIAGNOSIS — Z79890 Hormone replacement therapy (postmenopausal): Secondary | ICD-10-CM | POA: Diagnosis not present

## 2019-01-24 DIAGNOSIS — R0902 Hypoxemia: Secondary | ICD-10-CM | POA: Diagnosis not present

## 2019-01-24 DIAGNOSIS — Z882 Allergy status to sulfonamides status: Secondary | ICD-10-CM

## 2019-01-24 DIAGNOSIS — E118 Type 2 diabetes mellitus with unspecified complications: Secondary | ICD-10-CM | POA: Diagnosis present

## 2019-01-24 DIAGNOSIS — E559 Vitamin D deficiency, unspecified: Secondary | ICD-10-CM | POA: Diagnosis present

## 2019-01-24 DIAGNOSIS — E785 Hyperlipidemia, unspecified: Secondary | ICD-10-CM | POA: Diagnosis present

## 2019-01-24 DIAGNOSIS — R4020 Unspecified coma: Secondary | ICD-10-CM | POA: Diagnosis not present

## 2019-01-24 DIAGNOSIS — J9601 Acute respiratory failure with hypoxia: Secondary | ICD-10-CM | POA: Diagnosis not present

## 2019-01-24 DIAGNOSIS — K5730 Diverticulosis of large intestine without perforation or abscess without bleeding: Secondary | ICD-10-CM | POA: Diagnosis not present

## 2019-01-24 DIAGNOSIS — Z823 Family history of stroke: Secondary | ICD-10-CM

## 2019-01-24 DIAGNOSIS — E038 Other specified hypothyroidism: Secondary | ICD-10-CM | POA: Diagnosis not present

## 2019-01-24 DIAGNOSIS — Z8349 Family history of other endocrine, nutritional and metabolic diseases: Secondary | ICD-10-CM

## 2019-01-24 DIAGNOSIS — Z807 Family history of other malignant neoplasms of lymphoid, hematopoietic and related tissues: Secondary | ICD-10-CM | POA: Diagnosis not present

## 2019-01-24 DIAGNOSIS — N390 Urinary tract infection, site not specified: Secondary | ICD-10-CM | POA: Diagnosis present

## 2019-01-24 DIAGNOSIS — G9341 Metabolic encephalopathy: Secondary | ICD-10-CM | POA: Diagnosis present

## 2019-01-24 DIAGNOSIS — M1990 Unspecified osteoarthritis, unspecified site: Secondary | ICD-10-CM | POA: Diagnosis not present

## 2019-01-24 DIAGNOSIS — U071 COVID-19: Secondary | ICD-10-CM | POA: Diagnosis present

## 2019-01-24 DIAGNOSIS — H9190 Unspecified hearing loss, unspecified ear: Secondary | ICD-10-CM | POA: Diagnosis not present

## 2019-01-24 DIAGNOSIS — E119 Type 2 diabetes mellitus without complications: Secondary | ICD-10-CM | POA: Diagnosis present

## 2019-01-24 DIAGNOSIS — G4700 Insomnia, unspecified: Secondary | ICD-10-CM | POA: Diagnosis present

## 2019-01-24 DIAGNOSIS — R4182 Altered mental status, unspecified: Secondary | ICD-10-CM

## 2019-01-24 DIAGNOSIS — R059 Cough, unspecified: Secondary | ICD-10-CM

## 2019-01-24 DIAGNOSIS — Z886 Allergy status to analgesic agent status: Secondary | ICD-10-CM

## 2019-01-24 DIAGNOSIS — E039 Hypothyroidism, unspecified: Secondary | ICD-10-CM | POA: Diagnosis not present

## 2019-01-24 DIAGNOSIS — Z7901 Long term (current) use of anticoagulants: Secondary | ICD-10-CM

## 2019-01-24 DIAGNOSIS — Z803 Family history of malignant neoplasm of breast: Secondary | ICD-10-CM

## 2019-01-24 DIAGNOSIS — Z91013 Allergy to seafood: Secondary | ICD-10-CM

## 2019-01-24 DIAGNOSIS — Z91040 Latex allergy status: Secondary | ICD-10-CM

## 2019-01-24 DIAGNOSIS — M797 Fibromyalgia: Secondary | ICD-10-CM | POA: Diagnosis not present

## 2019-01-24 DIAGNOSIS — R5381 Other malaise: Secondary | ICD-10-CM | POA: Diagnosis not present

## 2019-01-24 DIAGNOSIS — I679 Cerebrovascular disease, unspecified: Secondary | ICD-10-CM | POA: Diagnosis not present

## 2019-01-24 DIAGNOSIS — Z8249 Family history of ischemic heart disease and other diseases of the circulatory system: Secondary | ICD-10-CM

## 2019-01-24 DIAGNOSIS — R404 Transient alteration of awareness: Secondary | ICD-10-CM | POA: Diagnosis not present

## 2019-01-24 LAB — COMPREHENSIVE METABOLIC PANEL
ALT: 19 U/L (ref 0–44)
AST: 22 U/L (ref 15–41)
Albumin: 2.7 g/dL — ABNORMAL LOW (ref 3.5–5.0)
Alkaline Phosphatase: 66 U/L (ref 38–126)
Anion gap: 11 (ref 5–15)
BUN: 12 mg/dL (ref 8–23)
CO2: 23 mmol/L (ref 22–32)
Calcium: 7.8 mg/dL — ABNORMAL LOW (ref 8.9–10.3)
Chloride: 99 mmol/L (ref 98–111)
Creatinine, Ser: 0.77 mg/dL (ref 0.44–1.00)
GFR calc Af Amer: >60 mL/min (ref >60)
GFR calc non Af Amer: >60 mL/min (ref >60)
Glucose, Bld: 389 mg/dL — ABNORMAL HIGH (ref 70–99)
Potassium: 3.6 mmol/L (ref 3.5–5.1)
Sodium: 133 mmol/L — ABNORMAL LOW (ref 135–145)
Total Bilirubin: 1 mg/dL (ref 0.3–1.2)
Total Protein: 6.2 g/dL — ABNORMAL LOW (ref 6.5–8.1)

## 2019-01-24 LAB — URINALYSIS, ROUTINE W REFLEX MICROSCOPIC
Bacteria, UA: NONE SEEN
Bilirubin Urine: NEGATIVE
Glucose, UA: >=500 mg/dL — AB
Ketones, ur: 20 mg/dL — AB
Leukocytes,Ua: NEGATIVE
Nitrite: NEGATIVE
Protein, ur: NEGATIVE mg/dL
Specific Gravity, Urine: 1.028 (ref 1.005–1.030)
pH: 5 (ref 5.0–8.0)

## 2019-01-24 LAB — LACTATE DEHYDROGENASE
LDH: 180 U/L (ref 98–192)
LDH: 171 U/L (ref 98–192)

## 2019-01-24 LAB — CBC WITH DIFFERENTIAL/PLATELET
Abs Immature Granulocytes: 0.05 10*3/uL (ref 0.00–0.07)
Basophils Absolute: 0 10*3/uL (ref 0.0–0.1)
Basophils Relative: 0 %
Eosinophils Absolute: 0 10*3/uL (ref 0.0–0.5)
Eosinophils Relative: 0 %
HCT: 33.8 % — ABNORMAL LOW (ref 36.0–46.0)
Hemoglobin: 11 g/dL — ABNORMAL LOW (ref 12.0–15.0)
Immature Granulocytes: 1 %
Lymphocytes Relative: 8 %
Lymphs Abs: 0.6 10*3/uL — ABNORMAL LOW (ref 0.7–4.0)
MCH: 29.9 pg (ref 26.0–34.0)
MCHC: 32.5 g/dL (ref 30.0–36.0)
MCV: 91.8 fL (ref 80.0–100.0)
Monocytes Absolute: 0.8 10*3/uL (ref 0.1–1.0)
Monocytes Relative: 10 %
Neutro Abs: 6.5 10*3/uL (ref 1.7–7.7)
Neutrophils Relative %: 81 %
Platelets: 195 10*3/uL (ref 150–400)
RBC: 3.68 MIL/uL — ABNORMAL LOW (ref 3.87–5.11)
RDW: 14 % (ref 11.5–15.5)
WBC: 7.9 10*3/uL (ref 4.0–10.5)
nRBC: 0 % (ref 0.0–0.2)

## 2019-01-24 LAB — BLOOD GAS, ARTERIAL
Acid-base deficit: 0.2 mmol/L (ref 0.0–2.0)
Bicarbonate: 24.2 mmol/L (ref 20.0–28.0)
FIO2: 2
O2 Saturation: 97.2 %
Patient temperature: 98.6
pCO2 arterial: 40.9 mmHg (ref 32.0–48.0)
pH, Arterial: 7.39 (ref 7.350–7.450)
pO2, Arterial: 105 mmHg (ref 83.0–108.0)

## 2019-01-24 LAB — CBG MONITORING, ED: Glucose-Capillary: 297 mg/dL — ABNORMAL HIGH (ref 70–99)

## 2019-01-24 LAB — D-DIMER, QUANTITATIVE
D-Dimer, Quant: 0.42 ug/mL-FEU (ref 0.00–0.50)
D-Dimer, Quant: 0.46 ug/mL-FEU (ref 0.00–0.50)

## 2019-01-24 LAB — GLUCOSE, CAPILLARY: Glucose-Capillary: 249 mg/dL — ABNORMAL HIGH (ref 70–99)

## 2019-01-24 LAB — FIBRINOGEN
Fibrinogen: 652 mg/dL — ABNORMAL HIGH (ref 210–475)
Fibrinogen: 643 mg/dL — ABNORMAL HIGH (ref 210–475)

## 2019-01-24 LAB — C-REACTIVE PROTEIN: CRP: 10 mg/dL — ABNORMAL HIGH (ref <1.0)

## 2019-01-24 LAB — PROCALCITONIN: Procalcitonin: <0.1 ng/mL

## 2019-01-24 LAB — PROTIME-INR
INR: 1.4 — ABNORMAL HIGH (ref 0.8–1.2)
Prothrombin Time: 17.4 seconds — ABNORMAL HIGH (ref 11.4–15.2)

## 2019-01-24 LAB — AMMONIA: Ammonia: 24 umol/L (ref 9–35)

## 2019-01-24 LAB — HEMOGLOBIN A1C
Hgb A1c MFr Bld: 11.5 % — ABNORMAL HIGH (ref 4.8–5.6)
Mean Plasma Glucose: 283.35 mg/dL

## 2019-01-24 LAB — FERRITIN: Ferritin: 152 ng/mL (ref 11–307)

## 2019-01-24 LAB — LACTIC ACID, PLASMA: Lactic Acid, Venous: 0.9 mmol/L (ref 0.5–1.9)

## 2019-01-24 LAB — ABO/RH: ABO/RH(D): O POS

## 2019-01-24 MED ORDER — DEXAMETHASONE SODIUM PHOSPHATE 10 MG/ML IJ SOLN
6.0000 mg | INTRAMUSCULAR | Status: DC
  Administered 2019-01-24 – 2019-01-27 (×4): 6 mg via INTRAVENOUS
  Filled 2019-01-24 (×4): qty 1

## 2019-01-24 MED ORDER — SODIUM CHLORIDE 0.9 % IV SOLN
1.0000 g | INTRAVENOUS | Status: DC
  Filled 2019-01-24: qty 10

## 2019-01-24 MED ORDER — SODIUM CHLORIDE 0.9 % IV SOLN
1.0000 g | INTRAVENOUS | Status: AC
  Administered 2019-01-24 – 2019-01-26 (×3): 1 g via INTRAVENOUS
  Filled 2019-01-24 (×2): qty 10

## 2019-01-24 MED ORDER — INSULIN GLARGINE 100 UNIT/ML ~~LOC~~ SOLN
20.0000 [IU] | Freq: Every day | SUBCUTANEOUS | Status: DC
  Administered 2019-01-24 – 2019-01-27 (×4): 20 [IU] via SUBCUTANEOUS
  Filled 2019-01-24 (×5): qty 0.2

## 2019-01-24 MED ORDER — INSULIN ASPART 100 UNIT/ML ~~LOC~~ SOLN
0.0000 [IU] | SUBCUTANEOUS | Status: DC
  Administered 2019-01-24: 8 [IU] via SUBCUTANEOUS
  Administered 2019-01-24: 5 [IU] via SUBCUTANEOUS
  Administered 2019-01-25: 2 [IU] via SUBCUTANEOUS
  Administered 2019-01-25 – 2019-01-26 (×5): 3 [IU] via SUBCUTANEOUS
  Administered 2019-01-26: 5 [IU] via SUBCUTANEOUS
  Administered 2019-01-26: 3 [IU] via SUBCUTANEOUS
  Administered 2019-01-26: 5 [IU] via SUBCUTANEOUS
  Administered 2019-01-26: 2 [IU] via SUBCUTANEOUS
  Administered 2019-01-26: 3 [IU] via SUBCUTANEOUS
  Administered 2019-01-27: 5 [IU] via SUBCUTANEOUS
  Administered 2019-01-27: 3 [IU] via SUBCUTANEOUS
  Administered 2019-01-27: 5 [IU] via SUBCUTANEOUS
  Filled 2019-01-24: qty 0.15

## 2019-01-24 MED ORDER — SODIUM CHLORIDE 0.9 % IV SOLN
INTRAVENOUS | Status: DC
  Administered 2019-01-24 – 2019-01-25 (×2): via INTRAVENOUS

## 2019-01-24 MED ORDER — LEVOTHYROXINE SODIUM 100 MCG/5ML IV SOLN
56.0000 ug | Freq: Every day | INTRAVENOUS | Status: DC
  Administered 2019-01-25: 56 ug via INTRAVENOUS
  Filled 2019-01-24: qty 5

## 2019-01-24 MED ORDER — ENOXAPARIN SODIUM 40 MG/0.4ML ~~LOC~~ SOLN
40.0000 mg | SUBCUTANEOUS | Status: DC
  Administered 2019-01-24: 40 mg via SUBCUTANEOUS
  Filled 2019-01-24: qty 0.4

## 2019-01-24 NOTE — H&P (Addendum)
History and Physical        Hospital Admission Note Date: 01/24/2019  Patient name: Jaclyn [name] Medical record number: [id number] Date of birth: 1936/01/20 Age: 83 y.o. Gender: female  PCP: Jinny Sanders, MD    Patient coming from: Home  I have reviewed all records in the [place].    Chief Complaint:  Altered mental status, shortness of breath  HPI: Patient is 83 year old female with a history of recent Covid infection, discharged from [facility], originally tested positive on 11/27 on 01/20/2019, CVA, hypertension, hypothyroidism, recurrent UTI's and recent UTI, was discharged on Keflex, presented to ED with shortness of breath and altered mental status.  Patient's family had called the PCP that patient was still having frequent urination and pain, was recommended to seek medical attention at ED if running fever or confusion.  Patient presented from home via EMS due to altered mental status.  Per family, patient had finished Keflex but was not getting any better, lethargic. In ED, patient was noted to be febrile temp 101.4 F, CBG 423, O2 sats 88% on 2 L, heart rate in 90s, received 500 mL normal saline bolus At the time of my encounter, patient is not responding to any verbal commands, opens eyes to her name.  Unable to obtain any history from patient. Per grand-daughter, at [place] line, hard of hearing, fairly alert and oriented, ambulates with cane. Per daughter, when she went to hospital the previous admission, she had no respiratory symptoms but was found to have COVID-19 hence was transferred to [facility].  When she came home, was finishing antibiotics. Still was some confused but now worse, more generalized weakness. Since yesterday, she started having fevers, PCP called in more antibiotics for 2 more days. No slurred speech or focal weakness but generalized weakness, shaky  more lethargic, not eating much.    Patient was Covid positive on 01/15/2019, discharged from [facility] on 01/20/2019  ED work-up/course:  Temp 100.8, RR 20, HR 83, BP 135/74 O2 sats 88-89% on 2 Liter  Sodium 133, potassium 3.6, CO2 23, glucose 389, WBC 7.9, hemoglobin 11.0, hematocrit 33.8, lactic acid 0.9  D-dimer 0.42 fibrinogen 652, INR 1.4 Chest x-ray shows no pneumonia UA actually shows no UTI, negative nitrites, leukocytes  Review of Systems: Positives marked in 'bold' Unable to obtain from the patient due to her mental status  Past Medical History: Past Medical History:  Diagnosis Date   Atrial fibrillation ([place])    Benign neoplasm of colon    Carotid artery occlusion    60-79% right ICA stenosis   Carotid bruit    Cerebrovascular disease, unspecified    Difficult intubation 2008   During surgery to remove large polyp   Diverticulosis of colon (without mention of hemorrhage)    Dysthymic disorder    Fibromyalgia    Headache(784.0)    Insomnia, unspecified    Interstitial cystitis    Myalgia and myositis, unspecified    Osteoarthrosis, unspecified whether generalized or localized, unspecified site    Other and unspecified hyperlipidemia    Other chest pain    Other specified benign mammary dysplasias    TIA (transient ischemic attack)  Type II or unspecified type diabetes mellitus without mention of complication, not stated as uncontrolled    Unspecified essential hypertension    Unspecified hypothyroidism    Unspecified vitamin D deficiency    Urinary tract infection, site not specified     Past Surgical History:  Procedure Laterality Date   ABDOMINAL HYSTERECTOMY  1988   cervical dysplasia   CARDIAC CATHETERIZATION  02/19/06   EF 60%   COLONOSCOPY     RIGHT COLECTOMY  2005   for villous adenoma of the cecum Dr.streck   VESICOVAGINAL FISTULA CLOSURE W/ TAH  1990   w/ cystocele &retocele repairs Dr. Ree Edman    Medications: Prior  to Admission medications   Medication Sig Start Date End Date Taking? Authorizing Provider  apixaban (ELIQUIS) 5 MG TABS tablet Take 1 tablet (5 mg total) by mouth 2 (two) times daily. 09/10/18   Bedsole, Amy E, MD  aspirin EC 81 MG EC tablet Take 1 tablet (81 mg total) by mouth daily. 06/21/17   Caren Griffins, MD  cephALEXin (KEFLEX) 500 MG capsule Take 1 capsule (500 mg total) by mouth every 12 (twelve) hours for 2 days. 01/22/19 01/24/19  Orma Flaming, MD  DULoxetine (CYMBALTA) 30 MG capsule Take 2 capsules (60 mg total) by mouth daily. 09/10/18   Bedsole, Amy E, MD  ezetimibe (ZETIA) 10 MG tablet Take 1 tablet (10 mg total) by mouth daily. 09/10/18   Bedsole, Amy E, MD  glipiZIDE (GLUCOTROL) 10 MG tablet Take 1 tablet (10 mg total) by mouth daily before breakfast. 09/10/18   Bedsole, Amy E, MD  Insulin Pen Needle (B-D ULTRAFINE III SHORT PEN) 31G X 8 MM MISC USE TO INJECT INSULIN ONCE DAILY. DX: E11.65 01/17/19   Bedsole, Amy E, MD  LANTUS SOLOSTAR 100 UNIT/ML Solostar Pen INJECT 90 UNITS SUBCUTANEOUSLY ONCE DAILY Patient taking differently: Inject 90 Units into the skin daily.  10/15/18   Bedsole, Amy E, MD  levothyroxine (SYNTHROID) 112 MCG tablet Take 1 tablet (112 mcg total) by mouth daily. 09/10/18   Bedsole, Amy E, MD  pravastatin (PRAVACHOL) 20 MG tablet Take 1 tablet (20 mg total) by mouth daily. 09/10/18   Bedsole, Amy E, MD  trimethoprim (TRIMPEX) 100 MG tablet TAKE 1 TABLET BY MOUTH ONCE DAILY FOR UTI PROPHYLAXSIS Patient taking differently: Take 100 mg by mouth daily. FOR UTI PROPHYLAXSIS 09/10/18   Bedsole, Amy E, MD  vitamin B-12 (CYANOCOBALAMIN) 1000 MCG tablet Take 1 tablet (1,000 mcg total) by mouth daily. 09/10/18   Bedsole, Amy E, MD  Vitamin D, Ergocalciferol, (DRISDOL) 1.25 MG (50000 UT) CAPS capsule Take 1 capsule (50,000 Units total) by mouth every 7 (seven) days. 09/10/18   Jinny Sanders, MD    Allergies:   Allergies  Allergen Reactions   Naproxen Sodium Other (See  Comments)    Fever/aches and pains   Statins Other (See Comments)    myalgias   Sulfonamide Derivatives Hives and Itching   Latex Itching and Rash   Shellfish Allergy Itching, Swelling and Rash    Seafood, shrimp    Social History:  reports that she has never smoked. She has never used smokeless tobacco. She reports that she does not drink alcohol or use drugs.  Family History: Family History  Problem Relation Age of Onset   Lymphoma Father    Hypertension Father    Stroke Father    Hyperlipidemia Father    Hypertension Mother    Allergies Brother    Hypertension Sister  3   Breast cancer Sister    Fibromyalgia Sister        3   Hyperlipidemia Sister        3    Physical Exam: Blood pressure 135/74, pulse 83, temperature (!) 100.8 F (38.2 C), temperature source Rectal, resp. rate 20, height 5\' 4"  (1.626 m), weight 68 kg, SpO2 96 %. General: Lethargic, not following any commands, opens eyes to name Eyes: pink conjunctiva,anicteric sclera, pupils equal and reactive to light and accomodation, HEENT: normocephalic, atraumatic, oropharynx clear Neck: supple, no masses or lymphadenopathy, no goiter, no bruits, no JVD CVS: Regular rate and rhythm, without murmurs, rubs or gallops. No lower extremity edema Resp : Decreased breath sound the bases GI : Soft, nontender, nondistended, positive bowel sounds, no masses.  Musculoskeletal: No clubbing or cyanosis, positive pedal pulses. No contracture. ROM intact  Neuro: Not following any commands Psych: Confused, lethargic Skin: no rashes or lesions, warm and dry   LABS on Admission: I have personally reviewed all the labs and imagings below    Basic Metabolic Panel: Recent Labs  Lab 01/19/19 0255 01/20/19 0515 01/24/19 1321  NA 139 134* 133*  K 4.3 3.7 3.6  CL 103 100 99  CO2 23 23 23   GLUCOSE 92 222* 389*  BUN 17 27* 12  CREATININE 0.56 0.63 0.77  CALCIUM 8.8* 8.9 7.8*  MG 2.0  --   --    Liver  Function Tests: Recent Labs  Lab 01/19/19 0255 01/24/19 1321  AST 24 22  ALT 26 19  ALKPHOS 72 66  BILITOT 0.5 1.0  PROT 6.5 6.2*  ALBUMIN 3.4* 2.7*   No results for input(s): LIPASE, AMYLASE in the last 168 hours. No results for input(s): AMMONIA in the last 168 hours. CBC: Recent Labs  Lab 01/18/19 0240 01/24/19 1321  WBC 13.3* 7.9  NEUTROABS  --  6.5  HGB 12.1 11.0*  HCT 36.4 33.8*  MCV 91.0 91.8  PLT 263 195   Cardiac Enzymes: No results for input(s): CKTOTAL, CKMB, CKMBINDEX, TROPONINI in the last 168 hours. BNP: Invalid input(s): POCBNP CBG: Recent Labs  Lab 01/20/19 1232 01/20/19 1541  GLUCAP 313* 405*    Radiological Exams on Admission:  Dg Chest Port 1 View  Result Date: 01/24/2019 CLINICAL DATA:  Recent hospitalization for COVID nineteen. Cough. Mental status. EXAM: PORTABLE CHEST 1 VIEW COMPARISON:  One-view chest x-ray 01/14/2019 FINDINGS: Heart is mildly enlarged. Opacity at the left base is chronic, likely related to pericardial fat. No new airspace disease present. There is no edema or effusion. Atherosclerotic changes are present at the aortic arch. IMPRESSION: 1. No acute cardiopulmonary disease or significant interval change. 2. Stable cardiomegaly without failure. 3. Atherosclerosis. Electronically Signed   By: San Morelle M.D.   On: 01/24/2019 15:11      EKG: Independently reviewed.  No new EKG   Assessment/Plan Principal Problem:   Acute metabolic encephalopathy -Unclear etiology, UA does not show UTI however granddaughter reported patient still having frequent urination, still on Keflex.  Chest x-ray negative for pneumonia.  Has history of prior TIAs and CVA.  COVID-19 related cognitive dysfunction? -Obtain stat ABG, ammonia level, CRP, D-dimer, fibrinogen, LDH, CT head for further work-up -Obtain procalcitonin, blood cultures,  - patient is not on any benzodiazepines or pain medications -N.p.o. until alert and awake, placed on  gentle IV fluid hydration, SLP evaluation when alert Addendum:  ABG pH 7.39/pCO2 40.9/ pO2 105  Active Problems:    Acute respiratory  failure with hypoxia ([place]) likely due to COVID-19 virus -Per granddaughter, patient did not have any respiratory symptoms, started developing fever since yesterday -Will obtain CRP, D-dimer, fibrinogen, LDH, start patient on IV Decadron due to hypoxia -Hold off on remdesivir for now, chest x-ray does not show any infiltrates patient had received full 5 days of IV remdesivir at [facility] Addendum Fibrinogen 652, D-dimer 0.42, CRP 10.0, (CRP was 0.8 on 01/17/2019), ferritin 152      Hypothyroidism -Placed on IV Synthroid -TSH 1.4 on 11/28    Poorly controlled diabetes mellitus ([place]), type II, IDDM with hyperglycemia -Placed on sliding scale insulin every 4 hours, due to steroids, expect hyperglycemia - will also add Lantus (takes 90 units at home), currently n.p.o. so will give lower dose -Hemoglobin A1c 11.2 on 01/15/2019    History of CVA (cerebrovascular accident), history of atrial fibrillation -Patient is on aspirin, Eliquis, Zetia, pravastatin -Currently n.p.o., patient very somnolent and lethargic - for now keep on Lovenox for DVT prophylaxis, await CT head to rule out any stroke/bleeding   DVT prophylaxis: Lovenox  CODE STATUS: Full CODE STATUS, discussed with patient's granddaughter who stated that patient had decided full CODE STATUS in the past, has difficult airway.  Patient's POA is her son  Consults called: None  Family Communication: Admission, patients condition and plan of care including tests being ordered have been discussed with the patient's granddaughter who indicates understanding and agree with the plan and Code Status  Admission status: Stepdown, inpatient  The medical decision making on this patient was of high complexity and the patient is at high risk for clinical deterioration, therefore this is a level 3  admission.  Severity of Illness:    The appropriate patient status for this patient is INPATIENT. Inpatient status is judged to be reasonable and necessary in order to provide the required intensity of service to ensure the patient's safety. The patient's presenting symptoms, physical exam findings, and initial radiographic and laboratory data in the context of their chronic comorbidities is felt to place them at high risk for further clinical deterioration. Furthermore, it is not anticipated that the patient will be medically stable for discharge from the hospital within 2 midnights of admission. The following factors support the patient status of inpatient.   " The patient's presenting symptoms include lethargic, not following any commands, confused hypoxia " The worrisome physical exam findings include altered mental status, hypoxia, fevers " The initial radiographic and laboratory data are worrisome because of possible Covid, other etiology " The chronic co-morbidities include recurrent UTIs, prior history of CVA, recent UTI, [id number]   * I certify that at the point of admission it is my clinical judgment that the patient will require inpatient hospital care spanning beyond 2 midnights from the point of admission due to high intensity of service, high risk for further deterioration and high frequency of surveillance required.*    Time Spent on Admission: 59mins      Yidel Teuscher M.D. [facility] 01/24/2019, 4:48 PM

## 2019-01-24 NOTE — ED Triage Notes (Signed)
Pt BIB EMS from home. Pt called due to AMS. Pt was discharged last Thursday from [facility]. Pt tested positive for COVID on 11/27. Family believes AMS is related to possible UTI due to chronic UTIs. Pt finished Keflex but not getting any better. Pt lethargic at this time.   HR 110 down to 90 with fluid 146/72 SpO2 92% RA RR 28 CBG 423 pt did not have insulin today Temp 101.4   20G LH 588mL NS 100mg  Tylenol

## 2019-01-24 NOTE — Telephone Encounter (Signed)
[facility] - Client TELEPHONE ADVICE RECORD [facility] Patient Name: Jaclyn [name] Gender: Female DOB: 1935-06-18 Age: 83 Y 32 M 11 D Return Phone Number: [phone] (Primary), [phone] (Secondary) Address: City/State/Zip: [place] [address] Client [facility] - Client Client Site [place] Physician Eliezer Lofts - MD Contact Type Call Who Is Calling Patient / Member / Family / Caregiver Call Type Triage / Clinical Caller Name Ziarah Bockus Relationship To Patient Son Return Phone Number [phone] (Primary) Chief Complaint Vaginal Pain Reason for Call Symptomatic / Request for [place] states his mother had a bad UTI and got out of the hospital on Thursday, she is positive for COVID. She still has vaginal pain with an odor. Translation No Nurse Assessment Nurse: Asa Lente, RN, Belenda Cruise Date/Time (Eastern Time): 01/22/2019 5:33:30 PM Confirm and document reason for call. If symptomatic, describe symptoms. ---Hospital discharge Thursday in for UTI, took last antibiotic today. Called the discharge nurse Today. Told to call PCP for more antibiotic. Frequent urination and pain continuing. Cephalexin 500mg  BID. No fever. Patient tested positive for COVID. Has the patient had close contact with a person known or suspected to have the novel coronavirus illness OR traveled / lives in area with major community spread (including international travel) in the last 14 days from the onset of symptoms? * If Asymptomatic, screen for exposure and travel within the last 14 days. ---No Does the patient have any new or worsening symptoms? ---Yes Will a triage be completed? ---Yes Related visit to physician within the last 2 weeks? ---Yes Does the PT have any chronic conditions? (i.e. diabetes, asthma, this includes High risk factors for pregnancy, etc.) ---Yes List  chronic conditions. ---diabetes, HTN, CVAs, Is this a behavioral health or substance abuse call? ---No Guidelines Guideline Title Affirmed Question Affirmed Notes Nurse Date/Time (Eastern Time) Urinary Tract Infection on Antibiotic Follow-up Call - Female [1] Side (flank) or lower back pain AND [2] Rae Mar 01/22/2019 5:41:14 PM PLEASE NOTE: All timestamps contained within this report are represented as [place] Standard Time. CONFIDENTIALTY NOTICE: This fax transmission is intended only for the addressee. It contains information that is legally privileged, confidential or otherwise protected from use or disclosure. If you are not the intended recipient, you are strictly prohibited from reviewing, disclosing, copying using or disseminating any of this information or taking any action in reliance on or regarding this information. If you have received this fax in error, please notify us immediately by telephone so that we can arrange for its return to [name]. Phone: [phone], Toll-Free: [phone], Fax: [phone] Page: 2 of 2 Call Id: [id number] Guidelines Guideline Title Affirmed Question Affirmed Notes Nurse Date/Time Eilene Ghazi Time) new onset since starting antibiotics Disp. Time Eilene Ghazi Time) Disposition Final User 01/22/2019 5:17:41 PM Attempt made - no message left Rae Mar 01/22/2019 5:53:12 PM Called On-Call Provider Asa Lente, RN, Belenda Cruise 01/22/2019 6:03:01 PM See HCP within 4 Hours (or PCP triage) Yes Asa Lente, RN, Berneta Sages Disagree/Comply Comply Caller Understands No PreDisposition Did not know what to do Care Advice Given Per Guideline * IF OFFICE WILL BE OPEN: You need to be seen within the next 3 or 4 hours. Call your doctor (or NP/PA) now or as soon as the office opens. CALL BACK IF: * You become worse. * Before taking any medicine, read all the instructions on the package. SEE HCP WITHIN 4 HOURS (OR PCP TRIAGE): CARE ADVICE given per  Urinary Tract Infection on Antibiotic Follow-Up Call, Female (Adult) guideline. * IF OFFICE WILL BE CLOSED AND PCP SECOND-LEVEL TRIAGE REQUIRED: You may need to be seen. Your doctor (or NP/PA) will want to talk with you to decide what's best. I'll page the on-call provider now. If you haven't heard from the provider (or me) within 30 minutes, call again. NOTE: If on-call provider can't be reached, send to [facility] or ED. PAIN MEDICINES: * For pain relief, you can take either acetaminophen, ibuprofen, or naproxen. * They are over-the-counter (OTC) pain drugs. You can buy them at the drugstore. * ACETAMINOPHEN - REGULAR STRENGTH TYLENOL: Take 650 mg (two 325 mg pills) by mouth every 4-6 hours as needed. Each Regular Strength Tylenol pill has 325 mg of acetaminophen. The most you should take each day is 3,250 mg (10 pills a day). Referrals REFERRED TO PCP OFFICE [facility] Paging DoctorName Phone DateTime Result/Outcome Message Type Notes Orma Flaming- MD [id number] 01/22/2019 5:53:12 PM Called On Call Provider - Reached Doctor Paged Orma Flaming- MD 01/22/2019 6:00:38 PM Spoke with On Call - General Message Result Will send in two more days of Cefalexin, If continues to be confused, runs fever, or urine not better or worse, Take to Emergency depARTMENT

## 2019-01-24 NOTE — ED Provider Notes (Signed)
[facility] DEPT Provider Note   CSN: [id number] Arrival date & time: 01/24/19  1253     History   Chief Complaint Chief Complaint  Patient presents with  . COVID +  . Altered Mental Status    HPI Jaclyn [name] is a 83 y.o. female.     HPI   83 year old female with change in mental status.  Recent history significant for admission from 01/13/2021 - 01/20/2019 with UTI and COVID.  She was discharged in improved condition with good oxygen saturations.  She is still on Keflex since the time of discharge.  She went home.  Past day or so family has noticed increasing drowsiness.  Patient states sleeping all day.  My exam is consistent with this. Sleeping. Will awaken when spoken to but needs constant stimulation to stay awake or will otherwise fall asleep in mid sentence. Hard to get much additional history out of her. Denies any acute pain anywhere. When asked about her breathing she replied "It seems ok."  Past Medical History:  Diagnosis Date  . Atrial fibrillation ([place])   . Benign neoplasm of colon   . Carotid artery occlusion    60-79% right ICA stenosis  . Carotid bruit   . Cerebrovascular disease, unspecified   . Difficult intubation 2008   During surgery to remove large polyp  . Diverticulosis of colon (without mention of hemorrhage)   . Dysthymic disorder   . Fibromyalgia   . Headache(784.0)   . Insomnia, unspecified   . Interstitial cystitis   . Myalgia and myositis, unspecified   . Osteoarthrosis, unspecified whether generalized or localized, unspecified site   . Other and unspecified hyperlipidemia   . Other chest pain   . Other specified benign mammary dysplasias   . TIA (transient ischemic attack)   . Type II or unspecified type diabetes mellitus without mention of complication, not stated as uncontrolled   . Unspecified essential hypertension   . Unspecified hypothyroidism   . Unspecified vitamin D deficiency   . Urinary  tract infection, site not specified    Patient Active Problem List   Diagnosis Date Noted  . COVID-19 virus infection 01/15/2019  . Acute lower UTI 01/15/2019  . B12 deficiency 01/22/2018  . History of CVA (cerebrovascular accident) 06/20/2017  . Type 2 diabetes mellitus with vascular disease ([place]) 06/20/2017  . Slurred speech   . Stenosis of right carotid artery   . Peripheral edema 07/29/2016  . Decreased alertness 07/07/2016  . Imbalance 04/07/2016  . Intertriginous candidiasis 04/26/2015  . Hard of hearing 04/12/2015  . TIA (transient ischemic attack) 04/10/2015  . History of recurrent UTIs 04/10/2015  . Dysuria 03/09/2015  . Tremor 05/11/2014  . Leg weakness, bilateral 05/11/2014  . Fatty liver 08/04/2013  . Personal history of colonic polyps 10/05/2012  . Esophageal reflux 10/05/2012  . Carotid stenosis, symptomatic, with infarction ([place]) 10/14/2010  . ANXIETY DEPRESSION 01/13/2008  . Essential hypertension, benign 01/13/2008  . PAROXYSMAL ATRIAL FIBRILLATION 01/13/2008  . Intracranial vascular stenosis 01/13/2008  . DIVERTICULOSIS OF COLON 01/13/2008  . Fibromyalgia 01/13/2008  . CHEST PAIN, ATYPICAL 01/13/2008  . Hypothyroidism 04/13/2007  . Poorly controlled diabetes mellitus ([place]) 04/13/2007  . Vitamin D deficiency 04/13/2007  . Hyperlipidemia LDL goal <70 04/13/2007  . DEGENERATIVE JOINT DISEASE 04/13/2007   Past Surgical History:  Procedure Laterality Date  . ABDOMINAL HYSTERECTOMY  1988   cervical dysplasia  . CARDIAC CATHETERIZATION  02/19/06   EF 60%  . COLONOSCOPY    .  RIGHT COLECTOMY  2005   for villous adenoma of the cecum Dr.streck  . VESICOVAGINAL FISTULA CLOSURE W/ TAH  1990   w/ cystocele &retocele repairs Dr. Ree Edman    OB History   No obstetric history on file.    Home Medications    Prior to Admission medications   Medication Sig Start Date End Date Taking? Authorizing Provider  apixaban (ELIQUIS) 5 MG TABS tablet Take 1 tablet (5 mg  total) by mouth 2 (two) times daily. 09/10/18   Bedsole, Amy E, MD  aspirin EC 81 MG EC tablet Take 1 tablet (81 mg total) by mouth daily. 06/21/17   Caren Griffins, MD  cephALEXin (KEFLEX) 500 MG capsule Take 1 capsule (500 mg total) by mouth every 12 (twelve) hours for 2 days. 01/22/19 01/24/19  Orma Flaming, MD  DULoxetine (CYMBALTA) 30 MG capsule Take 2 capsules (60 mg total) by mouth daily. 09/10/18   Bedsole, Amy E, MD  ezetimibe (ZETIA) 10 MG tablet Take 1 tablet (10 mg total) by mouth daily. 09/10/18   Bedsole, Amy E, MD  glipiZIDE (GLUCOTROL) 10 MG tablet Take 1 tablet (10 mg total) by mouth daily before breakfast. 09/10/18   Bedsole, Amy E, MD  Insulin Pen Needle (B-D ULTRAFINE III SHORT PEN) 31G X 8 MM MISC USE TO INJECT INSULIN ONCE DAILY. DX: E11.65 01/17/19   Bedsole, Amy E, MD  LANTUS SOLOSTAR 100 UNIT/ML Solostar Pen INJECT 90 UNITS SUBCUTANEOUSLY ONCE DAILY Patient taking differently: Inject 90 Units into the skin daily.  10/15/18   Bedsole, Amy E, MD  levothyroxine (SYNTHROID) 112 MCG tablet Take 1 tablet (112 mcg total) by mouth daily. 09/10/18   Bedsole, Amy E, MD  pravastatin (PRAVACHOL) 20 MG tablet Take 1 tablet (20 mg total) by mouth daily. 09/10/18   Bedsole, Amy E, MD  trimethoprim (TRIMPEX) 100 MG tablet TAKE 1 TABLET BY MOUTH ONCE DAILY FOR UTI PROPHYLAXSIS Patient taking differently: Take 100 mg by mouth daily. FOR UTI PROPHYLAXSIS 09/10/18   Bedsole, Amy E, MD  vitamin B-12 (CYANOCOBALAMIN) 1000 MCG tablet Take 1 tablet (1,000 mcg total) by mouth daily. 09/10/18   Bedsole, Amy E, MD  Vitamin D, Ergocalciferol, (DRISDOL) 1.25 MG (50000 UT) CAPS capsule Take 1 capsule (50,000 Units total) by mouth every 7 (seven) days. 09/10/18   Jinny Sanders, MD   Family History Family History  Problem Relation Age of Onset  . Lymphoma Father   . Hypertension Father   . Stroke Father   . Hyperlipidemia Father   . Hypertension Mother   . Allergies Brother   . Hypertension Sister         3  . Breast cancer Sister   . Fibromyalgia Sister        3  . Hyperlipidemia Sister        3   Social History Social History   Tobacco Use  . Smoking status: Never Smoker  . Smokeless tobacco: Never Used  Substance Use Topics  . Alcohol use: No    Alcohol/week: 0.0 standard drinks  . Drug use: No     Allergies   Naproxen sodium, Statins, Sulfonamide derivatives, Latex, and Shellfish allergy   Review of Systems Review of Systems All systems reviewed and negative, other than as noted in HPI.   Physical Exam Updated Vital Signs BP 119/65   Pulse 81   Temp (!) 100.8 F (38.2 C) (Rectal)   Resp (!) 24   Ht 5\' 4"  (1.626 m)  Wt 68 kg   SpO2 94%   BMI 25.73 kg/m   Physical Exam Vitals signs and nursing note reviewed.  Constitutional:      General: She is not in acute distress.    Appearance: She is well-developed.     Comments: Laying in bed. Sleeping.   HENT:     Head: Normocephalic and atraumatic.  Eyes:     General:        Right eye: No discharge.        Left eye: No discharge.     Conjunctiva/sclera: Conjunctivae normal.  Neck:     Musculoskeletal: Neck supple.  Cardiovascular:     Rate and Rhythm: Normal rate and regular rhythm.     Heart sounds: Normal heart sounds. No murmur. No friction rub. No gallop.   Pulmonary:     Effort: Pulmonary effort is normal. No respiratory distress.     Breath sounds: Normal breath sounds.  Abdominal:     General: There is no distension.     Palpations: Abdomen is soft.     Tenderness: There is no abdominal tenderness.  Musculoskeletal:        General: No tenderness.  Skin:    General: Skin is warm and dry.  Neurological:     Mental Status: She is alert.  Psychiatric:        Behavior: Behavior normal.        Thought Content: Thought content normal.     ED Treatments / Results  Labs (all labs ordered are listed, but only abnormal results are displayed) Labs Reviewed  COMPREHENSIVE METABOLIC PANEL -  Abnormal; Notable for the following components:      Result Value   Sodium 133 (*)    Glucose, Bld 389 (*)    Calcium 7.8 (*)    Total Protein 6.2 (*)    Albumin 2.7 (*)    All other components within normal limits  CBC WITH DIFFERENTIAL/PLATELET - Abnormal; Notable for the following components:   RBC 3.68 (*)    Hemoglobin 11.0 (*)    HCT 33.8 (*)    Lymphs Abs 0.6 (*)    All other components within normal limits  PROTIME-INR - Abnormal; Notable for the following components:   Prothrombin Time 17.4 (*)    INR 1.4 (*)    All other components within normal limits  URINALYSIS, ROUTINE W REFLEX MICROSCOPIC - Abnormal; Notable for the following components:   Glucose, UA >=500 (*)    Hgb urine dipstick SMALL (*)    Ketones, ur 20 (*)    All other components within normal limits  CULTURE, BLOOD (ROUTINE X 2)  CULTURE, BLOOD (ROUTINE X 2)  LACTIC ACID, PLASMA   EKG None  Radiology Dg Chest Port 1 View  Result Date: 01/24/2019 CLINICAL DATA:  Recent hospitalization for COVID nineteen. Cough. Mental status. EXAM: PORTABLE CHEST 1 VIEW COMPARISON:  One-view chest x-ray 01/14/2019 FINDINGS: Heart is mildly enlarged. Opacity at the left base is chronic, likely related to pericardial fat. No new airspace disease present. There is no edema or effusion. Atherosclerotic changes are present at the aortic arch. IMPRESSION: 1. No acute cardiopulmonary disease or significant interval change. 2. Stable cardiomegaly without failure. 3. Atherosclerosis. Electronically Signed   By: San Morelle M.D.   On: 01/24/2019 15:11    Procedures Procedures (including critical care time)  Medications Ordered in ED Medications - No data to display   Initial Impression / Assessment and Plan / ED Course  I have reviewed the triage vital signs and the nursing notes.  Pertinent labs & imaging results that were available during my care of the patient were reviewed by me and considered in my medical  decision making (see chart for details).   83yF with lethargy. Febrile. COVID related? UA looks better and currently on keflex. She desaturates into 80s at times on RA. She is not on home o2. Unfortunately, will readmit.   Final Clinical Impressions(s) / ED Diagnoses   Final diagnoses:  Fever, unspecified fever cause  Hypoxemia  Altered mental status, unspecified altered mental status type    ED Discharge Orders    None       Virgel Manifold, MD 01/24/19 1534

## 2019-01-25 LAB — CBC WITH DIFFERENTIAL/PLATELET
Abs Immature Granulocytes: 0.03 10*3/uL (ref 0.00–0.07)
Basophils Absolute: 0 10*3/uL (ref 0.0–0.1)
Basophils Relative: 0 %
Eosinophils Absolute: 0 10*3/uL (ref 0.0–0.5)
Eosinophils Relative: 0 %
HCT: 35.4 % — ABNORMAL LOW (ref 36.0–46.0)
Hemoglobin: 11.8 g/dL — ABNORMAL LOW (ref 12.0–15.0)
Immature Granulocytes: 0 %
Lymphocytes Relative: 5 %
Lymphs Abs: 0.5 10*3/uL — ABNORMAL LOW (ref 0.7–4.0)
MCH: 30.5 pg (ref 26.0–34.0)
MCHC: 33.3 g/dL (ref 30.0–36.0)
MCV: 91.5 fL (ref 80.0–100.0)
Monocytes Absolute: 0.2 10*3/uL (ref 0.1–1.0)
Monocytes Relative: 3 %
Neutro Abs: 8.2 10*3/uL — ABNORMAL HIGH (ref 1.7–7.7)
Neutrophils Relative %: 92 %
Platelets: 205 10*3/uL (ref 150–400)
RBC: 3.87 MIL/uL (ref 3.87–5.11)
RDW: 14 % (ref 11.5–15.5)
WBC: 8.9 10*3/uL (ref 4.0–10.5)
nRBC: 0 % (ref 0.0–0.2)

## 2019-01-25 LAB — GLUCOSE, CAPILLARY
Glucose-Capillary: 186 mg/dL — ABNORMAL HIGH (ref 70–99)
Glucose-Capillary: 200 mg/dL — ABNORMAL HIGH (ref 70–99)
Glucose-Capillary: 184 mg/dL — ABNORMAL HIGH (ref 70–99)
Glucose-Capillary: 162 mg/dL — ABNORMAL HIGH (ref 70–99)
Glucose-Capillary: 144 mg/dL — ABNORMAL HIGH (ref 70–99)

## 2019-01-25 LAB — C-REACTIVE PROTEIN: CRP: 12.2 mg/dL — ABNORMAL HIGH (ref <1.0)

## 2019-01-25 LAB — MRSA PCR SCREENING: MRSA by PCR: NEGATIVE

## 2019-01-25 LAB — FERRITIN: Ferritin: 153 ng/mL (ref 11–307)

## 2019-01-25 LAB — D-DIMER, QUANTITATIVE: D-Dimer, Quant: 0.54 ug/mL-FEU — ABNORMAL HIGH (ref 0.00–0.50)

## 2019-01-25 MED ORDER — TRIMETHOPRIM 100 MG PO TABS
100.0000 mg | ORAL_TABLET | Freq: Every day | ORAL | Status: DC
  Filled 2019-01-25 (×2): qty 1

## 2019-01-25 MED ORDER — VITAMIN B-12 1000 MCG PO TABS
1000.0000 ug | ORAL_TABLET | Freq: Every day | ORAL | Status: DC
  Administered 2019-01-27 – 2019-01-28 (×2): 1000 ug via ORAL
  Filled 2019-01-25 (×3): qty 1

## 2019-01-25 MED ORDER — ORAL CARE MOUTH RINSE
15.0000 mL | Freq: Two times a day (BID) | OROMUCOSAL | Status: DC
  Administered 2019-01-25 – 2019-01-28 (×6): 15 mL via OROMUCOSAL

## 2019-01-25 MED ORDER — CHLORHEXIDINE GLUCONATE CLOTH 2 % EX PADS
6.0000 | MEDICATED_PAD | Freq: Every day | CUTANEOUS | Status: DC
  Administered 2019-01-25 – 2019-01-27 (×3): 6 via TOPICAL

## 2019-01-25 MED ORDER — DULOXETINE HCL 60 MG PO CPEP
60.0000 mg | ORAL_CAPSULE | Freq: Every day | ORAL | Status: DC
  Administered 2019-01-27 – 2019-01-28 (×2): 60 mg via ORAL
  Filled 2019-01-25: qty 2
  Filled 2019-01-25 (×2): qty 1

## 2019-01-25 MED ORDER — LEVOTHYROXINE SODIUM 112 MCG PO TABS
112.0000 ug | ORAL_TABLET | Freq: Every day | ORAL | Status: DC
  Administered 2019-01-27 – 2019-01-28 (×2): 112 ug via ORAL
  Filled 2019-01-25 (×3): qty 1

## 2019-01-25 MED ORDER — QUETIAPINE FUMARATE 50 MG PO TABS
25.0000 mg | ORAL_TABLET | Freq: Once | ORAL | Status: AC
  Administered 2019-01-25: 25 mg via ORAL
  Filled 2019-01-25: qty 1

## 2019-01-25 MED ORDER — APIXABAN 5 MG PO TABS
5.0000 mg | ORAL_TABLET | Freq: Two times a day (BID) | ORAL | Status: DC
  Administered 2019-01-25 – 2019-01-28 (×5): 5 mg via ORAL
  Filled 2019-01-25 (×5): qty 1

## 2019-01-25 MED ORDER — LEVOTHYROXINE SODIUM 112 MCG PO TABS: 112.0000 ug | ORAL_TABLET | Freq: Every day | ORAL | Status: DC

## 2019-01-25 MED ORDER — LABETALOL HCL 5 MG/ML IV SOLN
5.0000 mg | Freq: Once | INTRAVENOUS | Status: AC
  Administered 2019-01-25: 5 mg via INTRAVENOUS
  Filled 2019-01-25: qty 4

## 2019-01-25 MED ORDER — ASPIRIN EC 81 MG PO TBEC
81.0000 mg | DELAYED_RELEASE_TABLET | Freq: Every day | ORAL | Status: DC
  Administered 2019-01-27 – 2019-01-28 (×2): 81 mg via ORAL
  Filled 2019-01-25 (×3): qty 1

## 2019-01-25 MED ORDER — LABETALOL HCL 5 MG/ML IV SOLN: 5.0000 mg | Freq: Once | INTRAVENOUS | Status: DC

## 2019-01-25 NOTE — Progress Notes (Signed)
PROGRESS NOTE    Jaclyn [name]  [id number] DOB: [birth year] DOA: 01/24/2019 PCP: Jinny Sanders, MD   Brief Narrative: 83 year old female with history of CVA, hypertension, hypothyroidism, recurrent UTI with recent UTI was admitted to [facility] 11/27-12/3 (no respiratory symptoms) discharged on Keflex for UTI, who has been having frequent urination pain sent to the ER on advice from PCP due to fever and confusion. In the ER febrile one 1.4, CBG 423 oxygen saturation 88% on 2 L.  Found to be encephalopathic while opening eyes on verbal stimuli in the ER, work-up showed D-dimer 0.42 fibrinogen 652, INR 1.4 Chest x-ray shows no pneumonia UA actually shows no UTI, negative nitrites, leukocytes. Patient was admitted to SDU for further work-up.  Subjective: Seen this morning in the stepdown unit Sleeping-but woke up on calling Alert,awake oriented to place, month, current president, moving all her extremities  Assessment & Plan:   Acute metabolic encephalopathy: unclear etiology, no evidence of UTI, chest x-ray negative for pneumonia.  Patient is nonfocal on examination. CT head no acute finding ABG no CO2 retention, pro-Cal is reassuring at < 0.1, ammonia nl 24.This morning she is alert awake oriented x3.  Bedside swallow eval for oral initiation.  Blood culture pending.  Acute respiratory failure with hypoxia/Recent COVID infection:Chest x-ray with no acute finding, patient needing supplemental oxygen.  Patient has completed 5 days of IV remdesivir recently.  Continue on Decadron for now.Wean supplemental oxygen, obtain PT eval. Inflammatory markers: Recent Labs    01/24/19 1321 01/24/19 1723 01/25/19 0242  DDIMER 0.42 0.46 0.54*  FERRITIN 152  --  153  LDH 180 171  --   CRP 10.0*  --  12.2*   Hypothyroidism : TSH 1.4 11/28, cont Synthroid.  Poorly controlled diabetes mellitus with A1c 11.5, with uncontrolled hyperglycemia 400 admission, currently 180s to 200. Cont lantus 20 units  bedtime, sliding scale insulin while on steroid.  Monitor and increase the insulin regimen as patient is on 90 units lantus at home  History of CVA/HLD/ PAF: resume statin/Zetia, home aspirin and Eliquis.   Generalized weakness/debility/ in the setting of recent Covid infection- PT eval.  Body mass index is 25.73 kg/m.    DVT prophylaxis: Resume Eliquis, and DC Lovenox. Code Status: Full code Family Communication: plan of care discussed with patient at bedside.  Called her son's no to update no answer. Disposition Plan: Remains inpatient pending clinical improvement in hypoxic respiratory failure/deconditioning   Consultants: NONE Procedures:NONE Microbiology:BLOODCX PENDING  Antimicrobials: Anti-infectives (From admission, onward)   Start     Dose/Rate Route Frequency Ordered Stop   01/24/19 1800  cefTRIAXone (ROCEPHIN) 1 g in sodium chloride 0.9 % 100 mL IVPB     1 g 200 mL/hr over 30 Minutes Intravenous Every 24 hours 01/24/19 1703 01/27/19 1759   01/24/19 1700  cefTRIAXone (ROCEPHIN) 1 g in sodium chloride 0.9 % 100 mL IVPB  Status:  Discontinued     1 g 200 mL/hr over 30 Minutes Intravenous Every 24 hours 01/24/19 1631 01/24/19 1703       Objective: Vitals:   01/25/19 0300 01/25/19 0400 01/25/19 0500 01/25/19 0600  BP: 120/72 (!) 135/52 (!) 138/59 104/80  Pulse: 76 76 80 79  Resp: 20 18 19 20   Temp:  98.3 F (36.8 C)    TempSrc:  Axillary    SpO2: 94% 93% 95% 93%  Weight:      Height:        Intake/Output Summary (Last 24 hours) at 01/25/2019  [id number] Last data filed at 01/25/2019 0300 Gross per 24 hour  Intake 330.3 ml  Output -  Net 330.3 ml   Filed Weights   01/24/19 1300  Weight: 68 kg   Weight change:   Body mass index is 25.73 kg/m.  Intake/Output from previous day: 12/07 0701 - 12/08 0700 In: 330.3 [I.V.:240.4; IV Piggyback:89.9] Out: -  Intake/Output this shift: No intake/output data recorded.  Examination:  General exam: AAOx3, weak and  frail on nasal cannula oxygen.   HEENT:Oral mucosa moist, Ear/Nose WNL grossly, dentition normal. Respiratory system: Bilaterally clear breath sounds, no wheezing or crackles,no use of accessory muscle Cardiovascular system: S1 & S2 +, No JVD,. Gastrointestinal system: Abdomen soft, NT,ND, BS+ Nervous System:Alert, awake, moving extremities and grossly nonfocal Extremities: No edema, distal peripheral pulses palpable.  Skin: No rashes,no icterus. MSK: Normal muscle bulk,tone, power  Medications:  Scheduled Meds: . Chlorhexidine Gluconate Cloth  6 each Topical Daily  . dexamethasone (DECADRON) injection  6 mg Intravenous Q24H  . enoxaparin (LOVENOX) injection  40 mg Subcutaneous Q24H  . insulin aspart  0-15 Units Subcutaneous Q4H  . insulin glargine  20 Units Subcutaneous QHS  . levothyroxine  56 mcg Intravenous Daily  . mouth rinse  15 mL Mouth Rinse BID   Continuous Infusions: . sodium chloride 75 mL/hr at 01/25/19 0300  . cefTRIAXone (ROCEPHIN)  IV Stopped (01/24/19 1808)    Data Reviewed: I have personally reviewed following labs and imaging studies  CBC: Recent Labs  Lab 01/24/19 1321 01/25/19 0242  WBC 7.9 8.9  NEUTROABS 6.5 8.2*  HGB 11.0* 11.8*  HCT 33.8* 35.4*  MCV 91.8 91.5  PLT 195 [id number]   Basic Metabolic Panel: Recent Labs  Lab 01/19/19 0255 01/20/19 0515 01/24/19 1321  NA 139 134* 133*  K 4.3 3.7 3.6  CL 103 100 99  CO2 23 23 23   GLUCOSE 92 222* 389*  BUN 17 27* 12  CREATININE 0.56 0.63 0.77  CALCIUM 8.8* 8.9 7.8*  MG 2.0  --   --    GFR: Estimated Creatinine Clearance: 52.2 mL/min (by C-G formula based on SCr of 0.77 mg/dL). Liver Function Tests: Recent Labs  Lab 01/19/19 0255 01/24/19 1321  AST 24 22  ALT 26 19  ALKPHOS 72 66  BILITOT 0.5 1.0  PROT 6.5 6.2*  ALBUMIN 3.4* 2.7*   No results for input(s): LIPASE, AMYLASE in the last 168 hours. Recent Labs  Lab 01/24/19 1722  AMMONIA 24   Coagulation Profile: Recent Labs  Lab  01/24/19 1321  INR 1.4*   Cardiac Enzymes: No results for input(s): CKTOTAL, CKMB, CKMBINDEX, TROPONINI in the last 168 hours. BNP (last 3 results) No results for input(s): PROBNP in the last 8760 hours. HbA1C: Recent Labs    01/24/19 1917  HGBA1C 11.5*   CBG: Recent Labs  Lab 01/20/19 1232 01/20/19 1541 01/24/19 2012 01/24/19 2255 01/25/19 0409  GLUCAP 313* 405* 297* 249* 186*   Lipid Profile: No results for input(s): CHOL, HDL, LDLCALC, TRIG, CHOLHDL, LDLDIRECT in the last 72 hours. Thyroid Function Tests: No results for input(s): TSH, T4TOTAL, FREET4, T3FREE, THYROIDAB in the last 72 hours. Anemia Panel: Recent Labs    01/24/19 1321 01/25/19 0242  FERRITIN 152 153   Sepsis Labs: Recent Labs  Lab 01/24/19 1321 01/24/19 1723  PROCALCITON  --  <0.10  LATICACIDVEN 0.9  --     Recent Results (from the past 240 hour(s))  Culture, blood (Routine x 2)  Status: None (Preliminary result)   Collection Time: 01/24/19  1:18 PM   Specimen: BLOOD RIGHT WRIST  Result Value Ref Range Status   Specimen Description   Final    BLOOD RIGHT WRIST Performed at [facility] [address], [place], [place] [address]    Special Requests   Final    BOTTLES DRAWN AEROBIC AND ANAEROBIC Blood Culture adequate volume Performed at [facility] [address] [place], [place] [address]    Culture   Final    NO GROWTH <12 HOURS Performed at [facility] [address] [place], [place] [address]    Report Status PENDING  Incomplete  MRSA PCR Screening     Status: None   Collection Time: 01/24/19 10:51 PM   Specimen: Nasopharyngeal  Result Value Ref Range Status   MRSA by PCR NEGATIVE NEGATIVE Final    Comment:        The GeneXpert MRSA Assay (FDA approved for NASAL specimens only), is one component of a comprehensive MRSA colonization surveillance program. It is not intended to diagnose MRSA infection nor to guide or  monitor treatment for MRSA infections. Performed at [facility], [place] [address], [place], [place] [address]       Radiology Studies: Ct Head Wo Contrast  Result Date: 01/24/2019 CLINICAL DATA:  Altered level of consciousness. EXAM: CT HEAD WITHOUT CONTRAST TECHNIQUE: Contiguous axial images were obtained from the base of the skull through the vertex without intravenous contrast. COMPARISON:  06/19/2017 FINDINGS: Brain: Stable age related cerebral atrophy, ventriculomegaly and periventricular white matter disease. No extra-axial fluid collections are identified. No CT findings for acute hemispheric infarction or intracranial hemorrhage. No mass lesions. The brainstem and cerebellum are normal. Vascular: Vascular calcifications but no aneurysm or hyperdense vessels. Skull: No skull fracture or bone lesions. Sinuses/Orbits: The paranasal sinuses are clear. The mastoid air cells are chronically hypoaerated. The middle ear cavities are clear. Other: No scalp lesions or hematoma. IMPRESSION: Stable cerebral atrophy, ventriculomegaly and periventricular white matter disease. No acute intracranial findings or mass lesions. Electronically Signed   By: Marijo Sanes M.D.   On: 01/24/2019 17:41   Dg Chest Port 1 View  Result Date: 01/24/2019 CLINICAL DATA:  Recent hospitalization for COVID nineteen. Cough. Mental status. EXAM: PORTABLE CHEST 1 VIEW COMPARISON:  One-view chest x-ray 01/14/2019 FINDINGS: Heart is mildly enlarged. Opacity at the left base is chronic, likely related to pericardial fat. No new airspace disease present. There is no edema or effusion. Atherosclerotic changes are present at the aortic arch. IMPRESSION: 1. No acute cardiopulmonary disease or significant interval change. 2. Stable cardiomegaly without failure. 3. Atherosclerosis. Electronically Signed   By: San Morelle M.D.   On: 01/24/2019 15:11      LOS: 1 day   Time spent: More than 50% of that time  was spent in counseling and/or coordination of care.  Antonieta Pert, MD [facility]  01/25/2019, 8:11 AM

## 2019-01-25 NOTE — TOC Initial Note (Signed)
Transition of Care [facility]) - Initial/Assessment Note    Patient Details  Name: Jaclyn [name] MRN: [id number] Date of Birth: [birth year]  [facility] CM/SW Contact:    Nila Nephew, LCSW Phone Number: 01/25/2019, 11:57 AM  Clinical Narrative:       Pt admitted with respiratory failure, from home where she resides with her family (adult children). Recently hospitalized for pneumonia, COVID+, DC on 01/20/2019. Had arranged home health services with Alvis Lemmings (PT/OT/RN) which had not yet begun at time of readmission per liaison. Had 3n1 and rolling walker arranged last admission as well.  TOC team will follow for disposition needs.            Expected Discharge Plan: [place] Barriers to Discharge: Continued Medical Work up   Expected Discharge Plan and Services Expected Discharge Plan: [place]                 Activities of Daily Living Home Assistive Devices/Equipment: Kasandra Knudsen (specify quad or straight)(single point cane) ADL Screening (condition at time of admission) Patient's cognitive ability adequate to safely complete daily activities?: Yes Is the patient deaf or have difficulty hearing?: No Does the patient have difficulty seeing, even when wearing glasses/contacts?: No Does the patient have difficulty concentrating, remembering, or making decisions?: No Patient able to express need for assistance with ADLs?: Yes Does the patient have difficulty dressing or bathing?: No Independently performs ADLs?: Yes (appropriate for developmental age) Does the patient have difficulty walking or climbing stairs?: Yes(secondary to weakness) Weakness of Legs: Both Weakness of Arms/Hands: None  Admission diagnosis:  Cough [R05] Hypoxemia [R09.02] Fever, unspecified fever cause [R50.9] Altered mental status, unspecified altered mental status type [R41.82] Acute respiratory failure with hypoxia ([place]) [J96.01] Patient Active Problem List   Diagnosis Date Noted  . Acute respiratory failure with hypoxia ([place]) 01/24/2019  . Acute metabolic encephalopathy [id number]  . COVID-19 virus infection 01/15/2019  . Acute lower UTI 01/15/2019  . B12 deficiency 01/22/2018  . History of CVA (cerebrovascular accident) 06/20/2017  . Type 2 diabetes mellitus with vascular disease ([place]) 06/20/2017  . Slurred speech   . Stenosis of right carotid artery   . Peripheral edema 07/29/2016  . Decreased alertness 07/07/2016  . Imbalance 04/07/2016  . Intertriginous candidiasis 04/26/2015  . Hard of hearing 04/12/2015  . TIA (transient ischemic attack) 04/10/2015  . History of recurrent UTIs 04/10/2015  . Dysuria 03/09/2015  . Tremor 05/11/2014  . Leg weakness, bilateral 05/11/2014  . Fatty liver 08/04/2013  . Personal history of colonic polyps 10/05/2012  . Esophageal reflux 10/05/2012  . Carotid stenosis, symptomatic, with infarction ([facility]) 10/14/2010  . ANXIETY DEPRESSION 01/13/2008  . Essential hypertension, benign 01/13/2008  . PAROXYSMAL ATRIAL FIBRILLATION 01/13/2008  . Intracranial vascular stenosis 01/13/2008  . DIVERTICULOSIS OF COLON 01/13/2008  . Fibromyalgia 01/13/2008  . CHEST PAIN, ATYPICAL 01/13/2008  . Hypothyroidism 04/13/2007  . Poorly controlled diabetes mellitus ([place]) 04/13/2007  . Vitamin D deficiency 04/13/2007  . Hyperlipidemia LDL goal <70 04/13/2007  . DEGENERATIVE JOINT DISEASE 04/13/2007   PCP:  Jinny Sanders, MD Pharmacy:   [facility] [address], [place] - [place] [address] [place] [address] Phone: [phone] Fax: [phone]

## 2019-01-25 NOTE — Progress Notes (Signed)
Pt is seemingly confused, staring off, not making eye contact, pulling equipment off, trying to get out of bed, but able to answer questions stating her name, where she is, and the year. Her son asked to speak to her and a call was facilitated. Her son was updated and all questions answered.

## 2019-01-25 NOTE — Plan of Care (Signed)
  Problem: Education: Goal: Knowledge of General Education information will improve Description Including pain rating scale, medication(s)/side effects and non-pharmacologic comfort measures Outcome: Progressing   

## 2019-01-25 NOTE — Progress Notes (Signed)
Pt was A&O x 4 most of the day, with some forgetfulness. Upon afternoon assessment, pt was oriented only to self, and had removed all monitoring cables. Pt remains cooperative after redirection from RN, but mood is labile. RN will continue to monitor pt and assess for safety concerns.

## 2019-01-25 NOTE — Progress Notes (Signed)
PHARMACY - PHYSICIAN COMMUNICATION CRITICAL VALUE ALERT - BLOOD CULTURE IDENTIFICATION (BCID)  Jaclyn [name] is an 83 y.o. female who presented to [facility] on 01/24/2019 with a chief complaint of metabolic encephalopathy  Assessment:  1 of 4 bottles from blood growing GPC in clusters - no BCID done due to shortage - presumed contaminant  Name of physician (or Provider) Contacted: Kc  Current antibiotics: Rocephin 1g IV daily  Changes to prescribed antibiotics recommended:  No changes recommended  No results found for this or any previous visit.  Jaclyn [name] 01/25/2019  4:59 PM

## 2019-01-26 DIAGNOSIS — U071 COVID-19: Principal | ICD-10-CM

## 2019-01-26 DIAGNOSIS — Z8673 Personal history of transient ischemic attack (TIA), and cerebral infarction without residual deficits: Secondary | ICD-10-CM

## 2019-01-26 DIAGNOSIS — E1165 Type 2 diabetes mellitus with hyperglycemia: Secondary | ICD-10-CM

## 2019-01-26 DIAGNOSIS — N390 Urinary tract infection, site not specified: Secondary | ICD-10-CM

## 2019-01-26 LAB — GLUCOSE, CAPILLARY
Glucose-Capillary: 103 mg/dL — ABNORMAL HIGH (ref 70–99)
Glucose-Capillary: 128 mg/dL — ABNORMAL HIGH (ref 70–99)
Glucose-Capillary: 164 mg/dL — ABNORMAL HIGH (ref 70–99)
Glucose-Capillary: 171 mg/dL — ABNORMAL HIGH (ref 70–99)
Glucose-Capillary: 171 mg/dL — ABNORMAL HIGH (ref 70–99)
Glucose-Capillary: 201 mg/dL — ABNORMAL HIGH (ref 70–99)
Glucose-Capillary: 205 mg/dL — ABNORMAL HIGH (ref 70–99)

## 2019-01-26 LAB — CBC WITH DIFFERENTIAL/PLATELET
Abs Immature Granulocytes: 0.05 10*3/uL (ref 0.00–0.07)
Basophils Absolute: 0 10*3/uL (ref 0.0–0.1)
Basophils Relative: 0 %
Eosinophils Absolute: 0 10*3/uL (ref 0.0–0.5)
Eosinophils Relative: 0 %
HCT: 32.3 % — ABNORMAL LOW (ref 36.0–46.0)
Hemoglobin: 10.4 g/dL — ABNORMAL LOW (ref 12.0–15.0)
Immature Granulocytes: 1 %
Lymphocytes Relative: 6 %
Lymphs Abs: 0.6 10*3/uL — ABNORMAL LOW (ref 0.7–4.0)
MCH: 30.1 pg (ref 26.0–34.0)
MCHC: 32.2 g/dL (ref 30.0–36.0)
MCV: 93.4 fL (ref 80.0–100.0)
Monocytes Absolute: 0.4 10*3/uL (ref 0.1–1.0)
Monocytes Relative: 3 %
Neutro Abs: 9.8 10*3/uL — ABNORMAL HIGH (ref 1.7–7.7)
Neutrophils Relative %: 90 %
Platelets: 193 10*3/uL (ref 150–400)
RBC: 3.46 MIL/uL — ABNORMAL LOW (ref 3.87–5.11)
RDW: 13.8 % (ref 11.5–15.5)
WBC: 10.8 10*3/uL — ABNORMAL HIGH (ref 4.0–10.5)
nRBC: 0 % (ref 0.0–0.2)

## 2019-01-26 LAB — CULTURE, BLOOD (ROUTINE X 2): Special Requests: ADEQUATE

## 2019-01-26 LAB — C-REACTIVE PROTEIN: CRP: 8.5 mg/dL — ABNORMAL HIGH (ref <1.0)

## 2019-01-26 LAB — FERRITIN: Ferritin: 180 ng/mL (ref 11–307)

## 2019-01-26 LAB — D-DIMER, QUANTITATIVE: D-Dimer, Quant: 0.72 ug/mL-FEU — ABNORMAL HIGH (ref 0.00–0.50)

## 2019-01-26 MED ORDER — SODIUM CHLORIDE 0.9 % IV SOLN: INTRAVENOUS | Status: DC | PRN

## 2019-01-26 MED ORDER — QUETIAPINE FUMARATE 50 MG PO TABS
25.0000 mg | ORAL_TABLET | Freq: Once | ORAL | Status: AC
  Administered 2019-01-26: 25 mg via ORAL
  Filled 2019-01-26: qty 1

## 2019-01-26 NOTE — Progress Notes (Signed)
Pt was seemingly confused throughout the day, and was unable to answer questions or follow simple commands from RN. All monitoring cords and pt gown were removed by pt during the majority of the day.   Pt is currently A&O x4, cooperative, and grateful to staff for assistance. RN facilitated a phone call to patient's son at bedside so pt could speak with him over the phone. RN will continue to monitor patient's orientation level and assess for safety concerns.

## 2019-01-26 NOTE — Progress Notes (Signed)
PROGRESS NOTE  Jaclyn [name] [id number] DOB: May 21, 1935   PCP: Jinny Sanders, MD  Patient is from: Home.  Ambulates with cane at baseline.  DOA: 01/24/2019 LOS: 2  Brief Narrative / Interim history: 83 year old female with history of recent hospitalization for COVID-19 at [facility] from 11/27-12/3, diminished hearing, CVA, dementia, HTN, hypothyroidism and recent UTI presented to ED with S OB, AMS, frequent and painful urination.  Reportedly finished course of Keflex for UTI.  In ED, febrile to 101.4.  CBG 423.  88% on 2 L.  Somewhat confused but no focal neuro deficits.  CXR negative for pneumonia.  Urine is suggestive for UTI.  Admitted for acute metabolic encephalopathy and acute respiratory failure with hypoxia.   Subjective: No major events overnight of this morning.  She is a sleepy but awakes to voice.  Reports feeling cold but not engaging much.  Does not appear to be in distress.  Objective: Vitals:   01/25/19 2010 01/26/19 0000 01/26/19 0600 01/26/19 0819  BP:   (!) 110/59 (!) 119/42  Pulse: 92   74  Resp:    20  Temp:  (!) 97.2 F (36.2 C) 97.8 F (36.6 C) 97.6 F (36.4 C)  TempSrc:  Axillary Axillary Axillary  SpO2: 100%  96% 96%  Weight:      Height:        Intake/Output Summary (Last 24 hours) at 01/26/2019 1241 Last data filed at 01/26/2019 1000 Gross per 24 hour  Intake 1519.69 ml  Output 100 ml  Net 1419.69 ml   Filed Weights   01/24/19 1300  Weight: 68 kg    Examination:  GENERAL: No acute distress.  Appears well.  HEENT: MMM.  Vision and hearing grossly intact.  NECK: Supple.  No apparent JVD.  RESP:  No IWOB.  Fair air movement bilaterally. CVS:  RRR. Heart sounds normal.  ABD/GI/GU: Bowel sounds present. Soft. Non tender.  MSK/EXT:  No apparent deformity or edema. Moves extremities. SKIN: no apparent skin lesion or wound NEURO: sleepy but wakes to voice. Doesn't engage.  No apparent focal neuro deficit.  Moves all extremities. PSYCH: Calm.  Normal affect.  Procedures:  None  Assessment & Plan: Acute metabolic encephalopathy: Delirium?  UA, CXR, ABG, ammonia, pro-Cal, blood cultures, MRSA PCR, CT head and neuro exam not revealing.  However, patient had UTI symptoms and objective fever concerning for UTI.  Reportedly has waxing and waning mental status concerning for delirium.  Steroid could contribute to mental status change as well..  -Frequent reorientation and delirium precautions. -Avoid sedating medications. -Discontinue ceftriaxone-  Acute respiratory failure with hypoxia in the setting of recent Covid infection: Desaturated to 88% requiring supplemental oxygen in ED.  She has already completed 5 days of remdesivir.  Now on Decadron.  Saturating at 96% on room air.  CRP downtrending. COVID-19 Labs  Recent Labs    01/24/19 1321 01/24/19 1723 01/25/19 0242 01/26/19 0218  DDIMER 0.42 0.46 0.54* 0.72*  FERRITIN 152  --  153 180  LDH 180 171  --   --   CRP 10.0*  --  12.2* 8.5*  -Continue Decadron 12/7>>> -Continue breathing treatments and vitamins. -Wean oxygen as able  Urinary tract infection: patient had UTI symptoms and fever although UA is not revealing.  Urine culture has not been sent. -Complete 5 days course with ceftriaxone 12/7>>. -Hold trimethoprim.  Uncontrolled DM-2 with hyperglycemia: A1c 11.5%.  CBG improved. CBG (last 3)  Recent Labs    01/26/19 0318 01/26/19 0809  01/26/19 1133  GLUCAP 201* 171* 164*   Continue current regimen   History of CVA, paroxysmal A. fib and HLD: Stable. -Continue home statin, Zetia and Eliquis. -Discontinue aspirin-increased risk of intracranial bleeding with Eliquis  Generalized weakness/debility/physical deconditioning:  -PT/OT eval               DVT prophylaxis: On Eliquis for A. fib Code Status: Full code Family Communication: None at bedside.  We will update. Disposition Plan: Remains inpatient due to ongoing metabolic encephalopathy  Consultants: None   Microbiology summarized: 12/7-MRSA PCR negative 12/7-blood cultures with GPC's in 1 out of 2 bottles  Sch Meds:  Scheduled Meds: . apixaban  5 mg Oral BID  . aspirin EC  81 mg Oral Daily  . Chlorhexidine Gluconate Cloth  6 each Topical Daily  . dexamethasone (DECADRON) injection  6 mg Intravenous Q24H  . DULoxetine  60 mg Oral Daily  . insulin aspart  0-15 Units Subcutaneous Q4H  . insulin glargine  20 Units Subcutaneous QHS  . levothyroxine  112 mcg Oral Q0600  . mouth rinse  15 mL Mouth Rinse BID  . vitamin B-12  1,000 mcg Oral Daily   Continuous Infusions: . sodium chloride Stopped (01/26/19 0535)  . cefTRIAXone (ROCEPHIN)  IV Stopped (01/25/19 1913)   PRN Meds:.  Antimicrobials: Anti-infectives (From admission, onward)   Start     Dose/Rate Route Frequency Ordered Stop   01/25/19 1800  trimethoprim (TRIMPEX) tablet 100 mg  Status:  Discontinued    Note to Pharmacy: OP [id number] 1 TABLET BY MOUTH ONCE DAILY FOR UTI PROPHYLAXSIS Patient taking differently: FOR UTI PROPHYLAXSIS     100 mg Oral Daily 01/25/19 1104 01/26/19 1240   01/24/19 1800  cefTRIAXone (ROCEPHIN) 1 g in sodium chloride 0.9 % 100 mL IVPB     1 g 200 mL/hr over 30 Minutes Intravenous Every 24 hours 01/24/19 1703 01/27/19 1759   01/24/19 1700  cefTRIAXone (ROCEPHIN) 1 g in sodium chloride 0.9 % 100 mL IVPB  Status:  Discontinued     1 g 200 mL/hr over 30 Minutes Intravenous Every 24 hours 01/24/19 1631 01/24/19 1703       I have personally reviewed the following labs and images: CBC: Recent Labs  Lab 01/24/19 1321 01/25/19 0242 01/26/19 0218  WBC 7.9 8.9 10.8*  NEUTROABS 6.5 8.2* 9.8*  HGB 11.0* 11.8* 10.4*  HCT 33.8* 35.4* 32.3*  MCV 91.8 91.5 93.4  PLT 195 205 193   BMP &GFR Recent Labs  Lab 01/20/19 0515 01/24/19 1321  NA 134* 133*  K 3.7 3.6  CL 100 99  CO2 23 23  GLUCOSE 222* 389*  BUN 27* 12  CREATININE 0.63 0.77  CALCIUM 8.9 7.8*   Estimated Creatinine  Clearance: 51 mL/min (by C-G formula based on SCr of 0.77 mg/dL). Liver & Pancreas: Recent Labs  Lab 01/24/19 1321  AST 22  ALT 19  ALKPHOS 66  BILITOT 1.0  PROT 6.2*  ALBUMIN 2.7*   No results for input(s): LIPASE, AMYLASE in the last 168 hours. Recent Labs  Lab 01/24/19 1722  AMMONIA 24   Diabetic: Recent Labs    01/24/19 1917  HGBA1C 11.5*   Recent Labs  Lab 01/25/19 2013 01/26/19 0027 01/26/19 0318 01/26/19 0809 01/26/19 1133  GLUCAP 144* 205* 201* 171* 164*   Cardiac Enzymes: No results for input(s): CKTOTAL, CKMB, CKMBINDEX, TROPONINI in the last 168 hours. No results for input(s): PROBNP in the last 8760 hours. Coagulation Profile: Recent  Labs  Lab 01/24/19 1321  INR 1.4*   Thyroid Function Tests: No results for input(s): TSH, T4TOTAL, FREET4, T3FREE, THYROIDAB in the last 72 hours. Lipid Profile: No results for input(s): CHOL, HDL, LDLCALC, TRIG, CHOLHDL, LDLDIRECT in the last 72 hours. Anemia Panel: Recent Labs    01/25/19 0242 01/26/19 0218  FERRITIN 153 180   Urine analysis:    Component Value Date/Time   COLORURINE YELLOW 01/24/2019 1321   APPEARANCEUR CLEAR 01/24/2019 1321   LABSPEC 1.028 01/24/2019 1321   PHURINE 5.0 01/24/2019 1321   GLUCOSEU >=500 (A) 01/24/2019 1321   GLUCOSEU NEGATIVE 10/08/2012 1148   HGBUR SMALL (A) 01/24/2019 1321   BILIRUBINUR NEGATIVE 01/24/2019 1321   BILIRUBINUR negative 12/18/2017 1613   KETONESUR 20 (A) 01/24/2019 1321   PROTEINUR NEGATIVE 01/24/2019 1321   UROBILINOGEN 0.2 12/18/2017 1613   UROBILINOGEN 0.2 11/01/2014 2113   NITRITE NEGATIVE 01/24/2019 1321   LEUKOCYTESUR NEGATIVE 01/24/2019 1321   Sepsis Labs: Invalid input(s): PROCALCITONIN, [place]  Microbiology: Recent Results (from the past 240 hour(s))  Culture, blood (Routine x 2)     Status: None (Preliminary result)   Collection Time: 01/24/19  1:18 PM   Specimen: BLOOD RIGHT WRIST  Result Value Ref Range Status   Specimen  Description   Final    BLOOD RIGHT WRIST Performed at [facility] [address] [place], [place] [address]    Special Requests   Final    BOTTLES DRAWN AEROBIC AND ANAEROBIC Blood Culture adequate volume Performed at [facility] [address] [place], [place] [address]    Culture   Final    NO GROWTH 2 DAYS Performed at [facility] [address] [place], [place] [address]    Report Status PENDING  Incomplete  Culture, blood (Routine x 2)     Status: None (Preliminary result)   Collection Time: 01/24/19  1:26 PM   Specimen: BLOOD LEFT HAND  Result Value Ref Range Status   Specimen Description BLOOD LEFT HAND  Final   Special Requests   Final    BOTTLES DRAWN AEROBIC AND ANAEROBIC Blood Culture adequate volume   Culture  Setup Time   Final    GRAM POSITIVE COCCI IN CLUSTERS AEROBIC BOTTLE ONLY CRITICAL RESULT CALLED TO, READ BACK BY AND VERIFIED WITH: Christean Grief PHARMD 1654 01/25/19 A BROWNING    Culture   Final    GRAM POSITIVE COCCI IDENTIFICATION TO FOLLOW Performed at [facility], [place] [address] [place], [place] [address]    Report Status PENDING  Incomplete  MRSA PCR Screening     Status: None   Collection Time: 01/24/19 10:51 PM   Specimen: Nasopharyngeal  Result Value Ref Range Status   MRSA by PCR NEGATIVE NEGATIVE Final    Comment:        The GeneXpert MRSA Assay (FDA approved for NASAL specimens only), is one component of a comprehensive MRSA colonization surveillance program. It is not intended to diagnose MRSA infection nor to guide or monitor treatment for MRSA infections. Performed at [facility], [place] [address], [place], [place] [address]     Radiology Studies: No results found.  35 minutes with more than 50% spent in reviewing records, counseling patient/family and coordinating care.  Aviyah Swetz T. [place]  If 7PM-7AM, please contact night-coverage  [email or web address] Password [email or web address] 01/26/2019, 12:41 PM

## 2019-01-26 NOTE — Progress Notes (Signed)
PT Cancellation Note  Patient Details Name: Jaclyn [name] MRN: [id number] DOB: 1935-05-03   Cancelled Treatment:    Reason Eval/Treat Not Completed: Medical issues which prohibited therapy. Pt  remains  Very confused,  Pt not tolerating/not cooperative even with vital signs being taken. PT deferred per RN's advice. Will attempt again as schedule permits.   [facility] 01/26/2019, 3:14 PM

## 2019-01-26 NOTE — Progress Notes (Signed)
Pt is confused. Being verbally threatening. Will not follow commands. Will continue to monitor.

## 2019-01-27 LAB — GLUCOSE, CAPILLARY
Glucose-Capillary: 198 mg/dL — ABNORMAL HIGH (ref 70–99)
Glucose-Capillary: 232 mg/dL — ABNORMAL HIGH (ref 70–99)
Glucose-Capillary: 229 mg/dL — ABNORMAL HIGH (ref 70–99)
Glucose-Capillary: 252 mg/dL — ABNORMAL HIGH (ref 70–99)
Glucose-Capillary: 283 mg/dL — ABNORMAL HIGH (ref 70–99)

## 2019-01-27 LAB — BASIC METABOLIC PANEL
Anion gap: 12 (ref 5–15)
BUN: 17 mg/dL (ref 8–23)
CO2: 23 mmol/L (ref 22–32)
Calcium: 8.6 mg/dL — ABNORMAL LOW (ref 8.9–10.3)
Chloride: 105 mmol/L (ref 98–111)
Creatinine, Ser: 0.64 mg/dL (ref 0.44–1.00)
GFR calc Af Amer: >60 mL/min (ref >60)
GFR calc non Af Amer: >60 mL/min (ref >60)
Glucose, Bld: 191 mg/dL — ABNORMAL HIGH (ref 70–99)
Potassium: 3.8 mmol/L (ref 3.5–5.1)
Sodium: 140 mmol/L (ref 135–145)

## 2019-01-27 LAB — MAGNESIUM: Magnesium: 1.9 mg/dL (ref 1.7–2.4)

## 2019-01-27 LAB — CBC WITH DIFFERENTIAL/PLATELET
Abs Immature Granulocytes: 0.06 10*3/uL (ref 0.00–0.07)
Basophils Absolute: 0 10*3/uL (ref 0.0–0.1)
Basophils Relative: 0 %
Eosinophils Absolute: 0 10*3/uL (ref 0.0–0.5)
Eosinophils Relative: 0 %
HCT: 33.1 % — ABNORMAL LOW (ref 36.0–46.0)
Hemoglobin: 10.7 g/dL — ABNORMAL LOW (ref 12.0–15.0)
Immature Granulocytes: 1 %
Lymphocytes Relative: 7 %
Lymphs Abs: 0.8 10*3/uL (ref 0.7–4.0)
MCH: 30.3 pg (ref 26.0–34.0)
MCHC: 32.3 g/dL (ref 30.0–36.0)
MCV: 93.8 fL (ref 80.0–100.0)
Monocytes Absolute: 0.3 10*3/uL (ref 0.1–1.0)
Monocytes Relative: 3 %
Neutro Abs: 9.6 10*3/uL — ABNORMAL HIGH (ref 1.7–7.7)
Neutrophils Relative %: 89 %
Platelets: 197 10*3/uL (ref 150–400)
RBC: 3.53 MIL/uL — ABNORMAL LOW (ref 3.87–5.11)
RDW: 14.1 % (ref 11.5–15.5)
WBC: 10.7 10*3/uL — ABNORMAL HIGH (ref 4.0–10.5)
nRBC: 0 % (ref 0.0–0.2)

## 2019-01-27 LAB — D-DIMER, QUANTITATIVE: D-Dimer, Quant: 0.83 ug/mL-FEU — ABNORMAL HIGH (ref 0.00–0.50)

## 2019-01-27 LAB — C-REACTIVE PROTEIN: CRP: 5.2 mg/dL — ABNORMAL HIGH (ref <1.0)

## 2019-01-27 LAB — FERRITIN: Ferritin: 216 ng/mL (ref 11–307)

## 2019-01-27 MED ORDER — INSULIN ASPART 100 UNIT/ML ~~LOC~~ SOLN
3.0000 [IU] | Freq: Three times a day (TID) | SUBCUTANEOUS | Status: DC
  Administered 2019-01-28 (×2): 3 [IU] via SUBCUTANEOUS

## 2019-01-27 MED ORDER — INSULIN ASPART 100 UNIT/ML ~~LOC~~ SOLN: 4.0000 [IU] | Freq: Three times a day (TID) | SUBCUTANEOUS | Status: DC

## 2019-01-27 MED ORDER — INSULIN ASPART 100 UNIT/ML ~~LOC~~ SOLN
0.0000 [IU] | Freq: Every day | SUBCUTANEOUS | Status: DC
  Administered 2019-01-27: 3 [IU] via SUBCUTANEOUS

## 2019-01-27 MED ORDER — LINAGLIPTIN 5 MG PO TABS
5.0000 mg | ORAL_TABLET | Freq: Every day | ORAL | Status: DC
  Administered 2019-01-27 – 2019-01-28 (×2): 5 mg via ORAL
  Filled 2019-01-27 (×2): qty 1

## 2019-01-27 MED ORDER — INSULIN ASPART 100 UNIT/ML ~~LOC~~ SOLN
0.0000 [IU] | Freq: Three times a day (TID) | SUBCUTANEOUS | Status: DC
  Administered 2019-01-27 – 2019-01-28 (×2): 8 [IU] via SUBCUTANEOUS
  Administered 2019-01-28: 11 [IU] via SUBCUTANEOUS

## 2019-01-27 NOTE — Plan of Care (Signed)

## 2019-01-27 NOTE — Progress Notes (Signed)
PROGRESS NOTE  Jaclyn [name] [id number] DOB: 1935-03-04   PCP: Jinny Sanders, MD  Patient is from: Home.  Ambulates with cane at baseline.  DOA: 01/24/2019 LOS: 3  Brief Narrative / Interim history: 83 year old female with history of recent hospitalization for COVID-19 at [facility] from 11/27-12/3, diminished hearing, CVA, dementia, HTN, hypothyroidism and recent UTI presented to ED with S OB, AMS, frequent and painful urination.  Reportedly finished course of Keflex for UTI.  In ED, febrile to 101.4.  CBG 423.  88% on 2 L.  Somewhat confused but no focal neuro deficits.  CXR negative for pneumonia.  Urine is suggestive for UTI.  Admitted for acute metabolic encephalopathy and acute respiratory failure with hypoxia.   Subjective: Patient had intermittent confusion and agitation.  She is calm and pleasant this morning.  Has no complaints.  She denies shortness of breath, chest pain, GI or UTI symptoms.  She says she is feeling thirsty and hungry.  Reports intermittent cough.  Objective: Vitals:   01/27/19 0000 01/27/19 0400 01/27/19 0645 01/27/19 1120  BP: (!) 138/58  (!) 120/55 135/64  Pulse:   (!) 57 63  Resp:   16 17  Temp:  (!) 97.5 F (36.4 C) 97.6 F (36.4 C) (!) 97.5 F (36.4 C)  TempSrc:  Oral Oral Oral  SpO2: 94%  96% 96%  Weight:      Height:        Intake/Output Summary (Last 24 hours) at 01/27/2019 1230 Last data filed at 01/27/2019 0400 Gross per 24 hour  Intake 196.67 ml  Output -  Net 196.67 ml   Filed Weights   01/24/19 1300  Weight: 68 kg    Examination:  GENERAL: No acute distress.  Appears well.  HEENT: MMM.  Vision and hearing grossly intact.  NECK: Supple.  No apparent JVD.  RESP:  No IWOB.  Fair air movement with rhonchi bilaterally. CVS:  RRR. Heart sounds normal.  ABD/GI/GU: Bowel sounds present. Soft. Non tender.  MSK/EXT:  Moves extremities. No apparent deformity or edema.  SKIN: no apparent skin lesion or wound NEURO: Awake, alert and  oriented self, place, month and year.  No apparent focal neuro deficit. PSYCH: Calm. Normal affect.  Procedures:  None  Assessment & Plan: Acute metabolic encephalopathy due to UTI and delirium: UA, CXR, ABG, ammonia, pro-Cal, blood cultures, MRSA PCR, CT head and neuro exam not revealing.  However, patient had UTI symptoms and objective fever concerning for UTI.  Reportedly has waxing and waning mental status concerning for delirium.  Steroid could contribute to mental status change as well.  She is oriented x3 today. -Frequent reorientation and delirium precautions. -Avoid sedating medications. -Treat treatable causes as below.  Urinary tract infection: patient had UTI symptoms and fever although UA is not revealing.  Urine culture sent after antibiotics.  BCx with coag negative staph in 1/2 bottles. -Complete 5 days course with ceftriaxone 12/7>>. -Hold trimethoprim.  Acute respiratory failure with hypoxia in the setting of recent Covid infection: Desaturated to 88% requiring supplemental oxygen in ED.  She has already completed 5 days of remdesivir.  Now on Decadron.  Saturating at 96% on room air.  CRP downtrending. Recent Labs    01/24/19 1321 01/24/19 1321 01/24/19 1723 01/25/19 0242 01/26/19 0218 01/27/19 0203  DDIMER 0.42   < > 0.46 0.54* 0.72* 0.83*  FERRITIN 152  --   --  153 180 216  LDH 180  --  171  --   --   --  CRP 10.0*  --   --  12.2* 8.5* 5.2*   < > = values in this interval not displayed.  -Continue Decadron 12/7>>> -Continue breathing treatments and vitamins. -Wean oxygen as able -Incentive spirometry.  OOB.  Uncontrolled DM-2 with hyperglycemia: A1c 11.5%.  CBG elevated. CBG (last 3)  Recent Labs    01/27/19 0319 01/27/19 0824 01/27/19 1116  GLUCAP 198* 232* 229*  -Continue SSI-moderate.  Added night coverage -Add NovoLog 3 units AC, and Tradjenta 5 mg daily -Continue Lantus 20 units nightly -Continue statin and Zetia   History of CVA,  paroxysmal A. fib and HLD: Stable. -Continue home statin, Zetia and Eliquis. -Discontinue aspirin-increased risk of intracranial bleeding with Eliquis  Generalized weakness/debility/physical deconditioning:  -PT/OT eval               DVT prophylaxis: On Eliquis for A. fib Code Status: Full code Family Communication: Updated patient's son over the phone 12/9. Disposition Plan: Remains inpatient.  Not quite back to baseline from mental status standpoint Consultants: None   Microbiology summarized: 12/7-MRSA PCR negative 12/7-blood cultures with GPC's in 1 out of 2 bottles  Sch Meds:  Scheduled Meds: . apixaban  5 mg Oral BID  . aspirin EC  81 mg Oral Daily  . Chlorhexidine Gluconate Cloth  6 each Topical Daily  . dexamethasone (DECADRON) injection  6 mg Intravenous Q24H  . DULoxetine  60 mg Oral Daily  . insulin aspart  0-15 Units Subcutaneous Q4H  . insulin glargine  20 Units Subcutaneous QHS  . levothyroxine  112 mcg Oral Q0600  . mouth rinse  15 mL Mouth Rinse BID  . vitamin B-12  1,000 mcg Oral Daily   Continuous Infusions: . sodium chloride 10 mL/hr at 01/26/19 1720   PRN Meds:.  Antimicrobials: Anti-infectives (From admission, onward)   Start     Dose/Rate Route Frequency Ordered Stop   01/25/19 1800  trimethoprim (TRIMPEX) tablet 100 mg  Status:  Discontinued    Note to Pharmacy: OP [id number] 1 TABLET BY MOUTH ONCE DAILY FOR UTI PROPHYLAXSIS Patient taking differently: FOR UTI PROPHYLAXSIS     100 mg Oral Daily 01/25/19 1104 01/26/19 1240   01/24/19 1800  cefTRIAXone (ROCEPHIN) 1 g in sodium chloride 0.9 % 100 mL IVPB     1 g 200 mL/hr over 30 Minutes Intravenous Every 24 hours 01/24/19 1703 01/26/19 1757   01/24/19 1700  cefTRIAXone (ROCEPHIN) 1 g in sodium chloride 0.9 % 100 mL IVPB  Status:  Discontinued     1 g 200 mL/hr over 30 Minutes Intravenous Every 24 hours 01/24/19 1631 01/24/19 1703       I have personally reviewed the following labs and  images: CBC: Recent Labs  Lab 01/24/19 1321 01/25/19 0242 01/26/19 0218 01/27/19 0203  WBC 7.9 8.9 10.8* 10.7*  NEUTROABS 6.5 8.2* 9.8* 9.6*  HGB 11.0* 11.8* 10.4* 10.7*  HCT 33.8* 35.4* 32.3* 33.1*  MCV 91.8 91.5 93.4 93.8  PLT 195 205 193 197   BMP &GFR Recent Labs  Lab 01/24/19 1321 01/27/19 0203  NA 133* 140  K 3.6 3.8  CL 99 105  CO2 23 23  GLUCOSE 389* 191*  BUN 12 17  CREATININE 0.77 0.64  CALCIUM 7.8* 8.6*  MG  --  1.9   Estimated Creatinine Clearance: 51 mL/min (by C-G formula based on SCr of 0.64 mg/dL). Liver & Pancreas: Recent Labs  Lab 01/24/19 1321  AST 22  ALT 19  ALKPHOS 66  BILITOT  1.0  PROT 6.2*  ALBUMIN 2.7*   No results for input(s): LIPASE, AMYLASE in the last 168 hours. Recent Labs  Lab 01/24/19 1722  AMMONIA 24   Diabetic: Recent Labs    01/24/19 1917  HGBA1C 11.5*   Recent Labs  Lab 01/26/19 2036 01/26/19 2255 01/27/19 0319 01/27/19 0824 01/27/19 1116  GLUCAP 103* 171* 198* 232* 229*   Cardiac Enzymes: No results for input(s): CKTOTAL, CKMB, CKMBINDEX, TROPONINI in the last 168 hours. No results for input(s): PROBNP in the last 8760 hours. Coagulation Profile: Recent Labs  Lab 01/24/19 1321  INR 1.4*   Thyroid Function Tests: No results for input(s): TSH, T4TOTAL, FREET4, T3FREE, THYROIDAB in the last 72 hours. Lipid Profile: No results for input(s): CHOL, HDL, LDLCALC, TRIG, CHOLHDL, LDLDIRECT in the last 72 hours. Anemia Panel: Recent Labs    01/26/19 0218 01/27/19 0203  FERRITIN 180 216   Urine analysis:    Component Value Date/Time   COLORURINE YELLOW 01/24/2019 1321   APPEARANCEUR CLEAR 01/24/2019 1321   LABSPEC 1.028 01/24/2019 1321   PHURINE 5.0 01/24/2019 1321   GLUCOSEU >=500 (A) 01/24/2019 1321   GLUCOSEU NEGATIVE 10/08/2012 1148   HGBUR SMALL (A) 01/24/2019 1321   BILIRUBINUR NEGATIVE 01/24/2019 1321   BILIRUBINUR negative 12/18/2017 1613   KETONESUR 20 (A) 01/24/2019 1321   PROTEINUR  NEGATIVE 01/24/2019 1321   UROBILINOGEN 0.2 12/18/2017 1613   UROBILINOGEN 0.2 11/01/2014 2113   NITRITE NEGATIVE 01/24/2019 1321   LEUKOCYTESUR NEGATIVE 01/24/2019 1321   Sepsis Labs: Invalid input(s): PROCALCITONIN, [place]  Microbiology: Recent Results (from the past 240 hour(s))  Culture, blood (Routine x 2)     Status: None (Preliminary result)   Collection Time: 01/24/19  1:18 PM   Specimen: BLOOD RIGHT WRIST  Result Value Ref Range Status   Specimen Description   Final    BLOOD RIGHT WRIST Performed at [facility] [address], [place], [place] [address]    Special Requests   Final    BOTTLES DRAWN AEROBIC AND ANAEROBIC Blood Culture adequate volume Performed at [facility] [address], [place], [place] [address]    Culture   Final    NO GROWTH 3 DAYS Performed at [facility], [place] [address] [place], [place] [address]    Report Status PENDING  Incomplete  Culture, blood (Routine x 2)     Status: Abnormal   Collection Time: 01/24/19  1:26 PM   Specimen: BLOOD LEFT HAND  Result Value Ref Range Status   Specimen Description BLOOD LEFT HAND  Final   Special Requests   Final    BOTTLES DRAWN AEROBIC AND ANAEROBIC Blood Culture adequate volume   Culture  Setup Time   Final    GRAM POSITIVE COCCI IN CLUSTERS AEROBIC BOTTLE ONLY CRITICAL RESULT CALLED TO, READ BACK BY AND VERIFIED WITH: J LEGGE [id number] 01/25/19 A BROWNING    Culture (A)  Final    STAPHYLOCOCCUS SPECIES (COAGULASE NEGATIVE) THE SIGNIFICANCE OF ISOLATING THIS ORGANISM FROM A SINGLE SET OF BLOOD CULTURES WHEN MULTIPLE SETS ARE DRAWN IS UNCERTAIN. PLEASE NOTIFY THE MICROBIOLOGY DEPARTMENT WITHIN ONE WEEK IF SPECIATION AND SENSITIVITIES ARE REQUIRED. Performed at [facility], [place] [address] [place], [place] [address]    Report Status 01/26/2019 FINAL  Final  MRSA PCR Screening     Status: None   Collection Time: 01/24/19 10:51 PM    Specimen: Nasopharyngeal  Result Value Ref Range Status  MRSA by PCR NEGATIVE NEGATIVE Final    Comment:        The GeneXpert MRSA Assay (FDA approved for NASAL specimens only), is one component of a comprehensive MRSA colonization surveillance program. It is not intended to diagnose MRSA infection nor to guide or monitor treatment for MRSA infections. Performed at [facility], [place] [address] [place], [place] [address]     Radiology Studies: No results found.  Diella Gillingham T. [place]  If 7PM-7AM, please contact night-coverage [email or web address] Password [email or web address] 01/27/2019, 12:30 PM

## 2019-01-27 NOTE — Plan of Care (Signed)

## 2019-01-27 NOTE — Care Management Important Message (Signed)
Important Message  Patient Details IM Letter given to Roque Lias SW Case Manager to present to the Patient Name: Jaclyn [name] MRN: [id number] Date of Birth: August 20, 1935   Medicare Important Message Given:  Yes     Kerin Salen 01/27/2019, 12:43 PM

## 2019-01-27 NOTE — Evaluation (Signed)
Physical Therapy Evaluation Patient Details Name: Jaclyn [name] MRN: [id number] DOB: 07-Feb-1936 Today's Date: 01/27/2019   History of Present Illness  Pt is 83 year old female with history of recent hospitalization for COVID-19 at [facility] from 11/27-12/3, diminished hearing, CVA, dementia, HTN, hypothyroidism and recent UTI presented to ED with shortness of breath, AMS, frequent and painful urination.  Pt admitted with acute metabolic encephalopathy and resp failure.  Clinical Impression  Pt admitted with above diagnosis. Pt was confused and with poor safety awareness at time of eval.  She did ambulate 45' with mod A and RW.  VSS on RA throughout therapy.  Pt currently with functional limitations due to the deficits listed below (see PT Problem List). Pt will benefit from skilled PT to increase their independence and safety with mobility to allow discharge to the venue listed below.       Follow Up Recommendations SNF    Equipment Recommendations  None recommended by PT    Recommendations for Other Services       Precautions / Restrictions Precautions Precautions: Fall      Mobility  Bed Mobility Overal bed mobility: Needs Assistance Bed Mobility: Supine to Sit;Sit to Supine     Supine to sit: Min assist;HOB elevated Sit to supine: Min assist;HOB elevated   General bed mobility comments: assist for trunk elevation  Transfers Overall transfer level: Needs assistance Equipment used: Rolling walker (2 wheeled) Transfers: Sit to/from Stand Sit to Stand: Min assist;From elevated surface         General transfer comment: performed x 2; cues for safe hand placement (required tactile cues)  Ambulation/Gait Ambulation/Gait assistance: Mod assist Gait Distance (Feet): 15 Feet Assistive device: Rolling walker (2 wheeled) Gait Pattern/deviations: Decreased stride length     General Gait Details: Pt easily distracted and taking hands off RW; required assist to keep hands on  walker, to move RW, and for balance;  required directing around room with tactile cues  Stairs            Wheelchair Mobility    Modified Rankin (Stroke Patients Only)       Balance Overall balance assessment: Needs assistance Sitting-balance support: Feet supported;Bilateral upper extremity supported Sitting balance-Leahy Scale: Fair     Standing balance support: Bilateral upper extremity supported;During functional activity Standing balance-Leahy Scale: Poor                               Pertinent Vitals/Pain Pain Assessment: No/denies pain    Home Living Family/patient expects to be discharged to:: Private residence Living Arrangements: Children Available Help at Discharge: Family;Available 24 hours/day Type of Home: House Home Access: Level entry     Home Layout: One level Home Equipment: Cane - single point;Grab bars - tub/shower Additional Comments: Pt unable to provide PLOF.  History taken from prior notes ~1 week ago    Prior Function Level of Independence: Independent with assistive device(s)         Comments: was able to care for herself; usually wears depends, uses a cane ; Pt unable to provide PLOF.  History taken from prior notes ~1 week ago     Hand Dominance        Extremity/Trunk Assessment   Upper Extremity Assessment Upper Extremity Assessment: Overall WFL for tasks assessed;Difficult to assess due to impaired cognition    Lower Extremity Assessment Lower Extremity Assessment: Overall WFL for tasks assessed;Difficult to assess due to impaired cognition  Cervical / Trunk Assessment Cervical / Trunk Assessment: Normal  Communication   Communication: HOH  Cognition Arousal/Alertness: Awake/alert Behavior During Therapy: Impulsive Overall Cognitive Status: Impaired/Different from baseline Area of Impairment: Orientation;Awareness;Attention;Problem solving;Memory;Following commands;Safety/judgement                  Orientation Level: Disoriented to;Time;Situation   Memory: Decreased short-term memory Following Commands: Follows multi-step commands inconsistently Safety/Judgement: Decreased awareness of safety   Problem Solving: Difficulty sequencing;Requires verbal cues;Requires tactile cues General Comments: Pt confused and unable to provide history; Has c/o "dizzy headiness"      General Comments General comments (skin integrity, edema, etc.): VSS on RA (sats maintained 96% and bp 120's / 60's)    Exercises     Assessment/Plan    PT Assessment Patient needs continued PT services  PT Problem List Decreased strength;Decreased activity tolerance;Decreased balance;Decreased mobility;Decreased cognition;Decreased knowledge of use of DME;Decreased safety awareness;Decreased knowledge of precautions;Cardiopulmonary status limiting activity       PT Treatment Interventions DME instruction;Gait training;Stair training;Functional mobility training;Therapeutic activities;Therapeutic exercise;Balance training;Cognitive remediation;Patient/family education    PT Goals (Current goals can be found in the Care Plan section)  Acute Rehab PT Goals Patient Stated Goal: get some juice (notified tech) PT Goal Formulation: With patient Time For Goal Achievement: 02/03/19 Potential to Achieve Goals: Good    Frequency Min 2X/week   Barriers to discharge        Co-evaluation               AM-PAC PT "6 Clicks" Mobility  Outcome Measure Help needed turning from your back to your side while in a flat bed without using bedrails?: A Little Help needed moving from lying on your back to sitting on the side of a flat bed without using bedrails?: A Little Help needed moving to and from a bed to a chair (including a wheelchair)?: A Little Help needed standing up from a chair using your arms (e.g., wheelchair or bedside chair)?: A Little Help needed to walk in hospital room?: A Lot Help needed climbing  3-5 steps with a railing? : A Lot 6 Click Score: 16    End of Session Equipment Utilized During Treatment: Gait belt Activity Tolerance: (limited due to confusion) Patient left: in bed;with call bell/phone within reach;with bed alarm set(COVID precautions - no chair alarm in room, returned to bed) Nurse Communication: Mobility status PT Visit Diagnosis: Muscle weakness (generalized) (M62.81);Unsteadiness on feet (R26.81)    Time: [id number] PT Time Calculation (min) (ACUTE ONLY): 25 min   Charges:   PT Evaluation $PT Eval Moderate Complexity: 1 Mod          Maggie Font, PT Acute Rehab Services Pager [phone] [facility] [phone] [facility] [phone]   Karlton Lemon 01/27/2019, 5:41 PM

## 2019-01-28 DIAGNOSIS — E038 Other specified hypothyroidism: Secondary | ICD-10-CM

## 2019-01-28 DIAGNOSIS — R5381 Other malaise: Secondary | ICD-10-CM

## 2019-01-28 LAB — CBC WITH DIFFERENTIAL/PLATELET
Abs Immature Granulocytes: 0.07 10*3/uL (ref 0.00–0.07)
Basophils Absolute: 0 10*3/uL (ref 0.0–0.1)
Basophils Relative: 0 %
Eosinophils Absolute: 0 10*3/uL (ref 0.0–0.5)
Eosinophils Relative: 0 %
HCT: 34.1 % — ABNORMAL LOW (ref 36.0–46.0)
Hemoglobin: 11 g/dL — ABNORMAL LOW (ref 12.0–15.0)
Immature Granulocytes: 1 %
Lymphocytes Relative: 8 %
Lymphs Abs: 0.7 10*3/uL (ref 0.7–4.0)
MCH: 30.1 pg (ref 26.0–34.0)
MCHC: 32.3 g/dL (ref 30.0–36.0)
MCV: 93.2 fL (ref 80.0–100.0)
Monocytes Absolute: 0.2 10*3/uL (ref 0.1–1.0)
Monocytes Relative: 3 %
Neutro Abs: 7.2 10*3/uL (ref 1.7–7.7)
Neutrophils Relative %: 88 %
Platelets: 221 10*3/uL (ref 150–400)
RBC: 3.66 MIL/uL — ABNORMAL LOW (ref 3.87–5.11)
RDW: 13.7 % (ref 11.5–15.5)
WBC: 8.2 10*3/uL (ref 4.0–10.5)
nRBC: 0 % (ref 0.0–0.2)

## 2019-01-28 LAB — URINE CULTURE
Culture: NO GROWTH
Special Requests: NORMAL

## 2019-01-28 LAB — GLUCOSE, CAPILLARY
Glucose-Capillary: 318 mg/dL — ABNORMAL HIGH (ref 70–99)
Glucose-Capillary: 286 mg/dL — ABNORMAL HIGH (ref 70–99)

## 2019-01-28 LAB — BASIC METABOLIC PANEL
Anion gap: 12 (ref 5–15)
BUN: 25 mg/dL — ABNORMAL HIGH (ref 8–23)
CO2: 23 mmol/L (ref 22–32)
Calcium: 8.6 mg/dL — ABNORMAL LOW (ref 8.9–10.3)
Chloride: 102 mmol/L (ref 98–111)
Creatinine, Ser: 0.72 mg/dL (ref 0.44–1.00)
GFR calc Af Amer: >60 mL/min (ref >60)
GFR calc non Af Amer: >60 mL/min (ref >60)
Glucose, Bld: 333 mg/dL — ABNORMAL HIGH (ref 70–99)
Potassium: 4.2 mmol/L (ref 3.5–5.1)
Sodium: 137 mmol/L (ref 135–145)

## 2019-01-28 LAB — MAGNESIUM: Magnesium: 1.8 mg/dL (ref 1.7–2.4)

## 2019-01-28 LAB — C-REACTIVE PROTEIN: CRP: 2.5 mg/dL — ABNORMAL HIGH (ref <1.0)

## 2019-01-28 LAB — FERRITIN: Ferritin: 247 ng/mL (ref 11–307)

## 2019-01-28 LAB — D-DIMER, QUANTITATIVE: D-Dimer, Quant: 1.04 ug/mL-FEU — ABNORMAL HIGH (ref 0.00–0.50)

## 2019-01-28 MED ORDER — LINAGLIPTIN 5 MG PO TABS: 5.0000 mg | ORAL_TABLET | Freq: Every day | ORAL | 1 refills | Status: DC

## 2019-01-28 MED ORDER — LANTUS SOLOSTAR 100 UNIT/ML ~~LOC~~ SOPN: 35.0000 [IU] | PEN_INJECTOR | Freq: Every day | SUBCUTANEOUS | 2 refills | Status: DC

## 2019-01-28 MED ORDER — LANTUS SOLOSTAR 100 UNIT/ML ~~LOC~~ SOPN: 30.0000 [IU] | PEN_INJECTOR | Freq: Every day | SUBCUTANEOUS | 2 refills | Status: DC

## 2019-01-28 MED ORDER — TRIMETHOPRIM 100 MG PO TABS: 100.0000 mg | ORAL_TABLET | Freq: Every day | ORAL | Status: DC

## 2019-01-28 MED ORDER — CEPHALEXIN 500 MG PO CAPS: 500.0000 mg | ORAL_CAPSULE | Freq: Three times a day (TID) | ORAL | 0 refills | Status: AC

## 2019-01-28 MED ORDER — LEVOTHYROXINE SODIUM 112 MCG PO TABS: 112.0000 ug | ORAL_TABLET | Freq: Every day | ORAL | Status: DC

## 2019-01-28 MED ORDER — DULOXETINE HCL 30 MG PO CPEP: 60.0000 mg | ORAL_CAPSULE | Freq: Every day | ORAL | 1 refills | Status: DC

## 2019-01-28 NOTE — Progress Notes (Signed)
Pt discharged to home instructions reviewed with patient and son, acknowledged understanding. Covid care reviewed. Acknowledged understanding. SRP, RN

## 2019-01-28 NOTE — Evaluation (Signed)
Occupational Therapy Evaluation Patient Details Name: Jaclyn [name] MRN: [id number] DOB: January 18, 1936 Today's Date: 01/28/2019    History of Present Illness Pt is 83 year old female with history of recent hospitalization for COVID-19 at [facility] from 11/27-12/3, diminished hearing, CVA, dementia, HTN, hypothyroidism and recent UTI presented to ED with shortness of breath, AMS, frequent and painful urination.  Pt admitted with acute metabolic encephalopathy and resp failure.   Clinical Impression   Pt was admitted for the above.  Prior to her recent admission, she was able to care for herself per last notes.  She needs min to mod A at this time. Will follow in acute setting with min guard level goals.  Recommend 24/7 and [place].  Pt states her BSC is old, but she couldn't say how old.  HHOT may need to assess its condition    Follow Up Recommendations  Home health OT;Supervision/Assistance - 24 hour    Equipment Recommendations  (HHOT can assess condition of her commode)    Recommendations for Other Services       Precautions / Restrictions Precautions Precautions: Fall Restrictions Weight Bearing Restrictions: No      Mobility Bed Mobility         Supine to sit: Min guard        Transfers   Equipment used: None;Rolling walker (2 wheeled)   Sit to Stand: Min assist;Mod assist Stand pivot transfers: Min assist;Mod assist       General transfer comment: less assistance with RW    Balance                                           ADL either performed or assessed with clinical judgement   ADL       Grooming: Brushing hair;Set up   Upper Body Bathing: Minimal assistance   Lower Body Bathing: Moderate assistance   Upper Body Dressing : Minimal assistance   Lower Body Dressing: Moderate assistance   Toilet Transfer: Minimal assistance;Moderate assistance;Stand-pivot;BSC;RW   Toileting- Clothing Manipulation and Hygiene: Minimal  assistance;Moderate assistance;Sit to/from stand         General ADL Comments: performed toileting. Pt started to stand prior to OT getting RW.  Mod A for SPT without this then min A for back to bed with RW.       Vision         Perception     Praxis      Pertinent Vitals/Pain Pain Assessment: No/denies pain     Hand Dominance     Extremity/Trunk Assessment Upper Extremity Assessment Upper Extremity Assessment: Overall WFL for tasks assessed           Communication Communication Communication: HOH   Cognition Arousal/Alertness: Awake/alert Behavior During Therapy: Impulsive Overall Cognitive Status: Impaired/Different from baseline                     Current Attention Level: Sustained Memory: Decreased short-term memory   Safety/Judgement: Decreased awareness of safety     General Comments: pt HOH and N95/negative pressure fans made it more difficult   General Comments       Exercises     Shoulder Instructions      Home Living Family/patient expects to be discharged to:: Private residence Living Arrangements: Children Available Help at Discharge: Family;Available 24 hours/day  Home Equipment: Cane - single point;Grab bars - tub/shower;Bedside commode   Additional Comments: pt states she has a BSC but it is old      Prior Functioning/Environment Level of Independence: Independent with assistive device(s)        Comments: was able to care for herself; usually wears depends, uses a cane ; Pt unable to provide PLOF.  History taken from prior notes ~1 week ago        OT Problem List: Decreased strength;Decreased activity tolerance;Impaired balance (sitting and/or standing);Decreased cognition;Decreased safety awareness;Decreased knowledge of use of DME or AE;Cardiopulmonary status limiting activity      OT Treatment/Interventions: Self-care/ADL training;Therapeutic exercise;Energy conservation;DME and/or  AE instruction;Therapeutic activities;Cognitive remediation/compensation;Patient/family education;Balance training    OT Goals(Current goals can be found in the care plan section) Acute Rehab OT Goals Patient Stated Goal: home OT Goal Formulation: With patient Time For Goal Achievement: 02/11/19 Potential to Achieve Goals: Good ADL Goals Pt Will Transfer to Toilet: with min guard assist;ambulating;bedside commode Pt Will Perform Toileting - Clothing Manipulation and hygiene: with min guard assist;sit to/from stand Additional ADL Goal #1: pt will perform ADL with min A, sustaining attention to task for 5 minutes without redirection in a quiet environment  OT Frequency: Min 2X/week   Barriers to D/C:            Co-evaluation              AM-PAC OT "6 Clicks" Daily Activity     Outcome Measure Help from another person eating meals?: A Little Help from another person taking care of personal grooming?: A Little Help from another person toileting, which includes using toliet, bedpan, or urinal?: A Lot Help from another person bathing (including washing, rinsing, drying)?: A Lot Help from another person to put on and taking off regular upper body clothing?: A Little Help from another person to put on and taking off regular lower body clothing?: A Lot 6 Click Score: 15   End of Session    Activity Tolerance: Patient tolerated treatment well Patient left: in bed;with call bell/phone within reach;with bed alarm set  OT Visit Diagnosis: Unsteadiness on feet (R26.81);Muscle weakness (generalized) (M62.81);Low vision, both eyes (H54.2)                Time: [id number] OT Time Calculation (min): 19 min Charges:  OT General Charges $OT Visit: 1 Visit OT Evaluation $OT Eval Moderate Complexity: 1 Mod  Jaclyn [name] S, OTR/L Acute Rehabilitation Services 01/28/2019  Jabril Pursell 01/28/2019, 12:57 PM

## 2019-01-28 NOTE — Discharge Instructions (Signed)

## 2019-01-28 NOTE — Discharge Summary (Signed)
Physician Discharge Summary  Jaclyn [name] [id number] DOB: 1935/05/28 DOA: 01/24/2019  PCP: Jinny Sanders, MD  Admit date: 01/24/2019 Discharge date: 01/28/2019  Admitted From: Home Disposition: Home  Recommendations for Outpatient Follow-up:  1. Follow ups as below. 2. Please obtain CBC/BMP/Mag at follow up 3. Please follow up on the following pending results: None  Home Health: PT/OT Equipment/Devices: None  Discharge Condition: Stable CODE STATUS: Full code  Follow-up Information    Bedsole, Amy E, MD. Schedule an appointment as soon as possible for a visit in 2 week(s).   Specialty: Family Medicine Contact information: [place] [address] [phone]        [facility] up.   Specialty: Home Health Services Why: [facility] will call you before coming out to visit. Please feel free to call them.  Contact information: [place] [address] [phone]           Hospital Course: 83 year old female with history of recent hospitalization for COVID-19 at [facility] from 11/27-12/3, diminished hearing, CVA, dementia, HTN, hypothyroidism and recent UTI presented to ED with S OB, AMS, frequent and painful urination.  Reportedly finished course of Keflex for UTI.  In ED, febrile to 101.4.  CBG 423.  88% on 2 L.  Somewhat confused but no focal neuro deficits.  CXR negative for pneumonia.  Urine is suggestive for UTI.  Admitted for acute metabolic encephalopathy and acute respiratory failure with hypoxia.  Encephalopathy thought to be due to UTI.  She was started on IV ceftriaxone with significant improvement in her mental status.  Urine culture was negative but obtained about 24 hours after antibiotic.  She was discharged on Keflex for 3 more days to complete a total of 6 days.   Patient was evaluated by PT/OT who recommended SNF.  However, patient and family declined SNF, and she was discharged home with home  health.  See individual problem list below for more hospital course.  Discharge Diagnoses:  Acute metabolic encephalopathy due to UTI and delirium: Patient has urinary frequency, incontinence and fever.  UA, CXR, ABG, ammonia, pro-Cal, blood cultures, MRSA PCR, CT head and neuro exam not revealing.  Urine culture negative but obtained about 24 hours after antibiotic.  Encephalopathy resolved, and she is oriented x4.   Recurrent UTI: patient had UTI symptoms and fever although UA is not revealing.  Urine culture negative but sent after antibiotics.  BCx with coag negative staph in 1/2 bottles.  Ceftriaxone 12/7-12/11.  Discharged on Keflex for 3 more days, the she will resume her trimethoprim for prophylaxis.  Acute respiratory failure with hypoxia in the setting of recent Covid infection: desaturated to 88% requiring supplemental oxygen in ED.   Had already completed 5 days of remdesivir prior to admission.  Decadron 12/7-12/11.  She was saturating at 96% on room air on the day of discharge.  Inflammatory markers improved as below. Recent Labs    01/26/19 0218 01/27/19 0203 01/28/19 0310  DDIMER 0.72* 0.83* 1.04*  FERRITIN 180 216 247  CRP 8.5* 5.2* 2.5*    Uncontrolled DM-2 with hyperglycemia: A1c 11.5%. Not in DKA or HHS.  Hyperglycemia due to steroids and dietary liberation during acute illness.  Anticipate improvement of steroid.  Recent Labs    01/27/19 2006 01/28/19 0815 01/28/19 1212  GLUCAP 283* 318* 286*  -Discharged on Lantus 35 units daily.  Added Tradjenta -Stop home glipizide.   History of CVA, paroxysmal A. fib  and HLD: Stable. -Discharged on home statin, Zetia and Eliquis.  Generalized weakness/debility/physical deconditioning:  PT/OT recommended SNF but patient and family declined.  Discharged with home health PT/OT.  Discharge Instructions  Discharge Instructions    Call MD for:  difficulty breathing, headache or visual disturbances   Complete by: As  directed    Call MD for:  extreme fatigue   Complete by: As directed    Call MD for:  persistant dizziness or light-headedness   Complete by: As directed    Call MD for:  persistant nausea and vomiting   Complete by: As directed    Diet - low sodium heart healthy   Complete by: As directed    Diet Carb Modified   Complete by: As directed    Discharge instructions   Complete by: As directed    It has been a pleasure taking care of you! You were hospitalized with altered mental status which was likely due to urinary tract infection.  You were treated with antibiotics.  We are discharging you more antibiotics to complete treatment course.  There could be some changes to your medication after this hospitalization. Please review your new medication list and the directions before you take your medications.   Please follow-up with your primary care doctor in 2 to 3 weeks or sooner if needed.   Take care,   Increase activity slowly   Complete by: As directed      Allergies as of 01/28/2019      Reactions   Naproxen Sodium Other (See Comments)   Fever/aches and pains   Statins Other (See Comments)   myalgias   Sulfonamide Derivatives Hives, Itching   Latex Itching, Rash   Shellfish Allergy Itching, Swelling, Rash   Seafood, shrimp      Medication List    STOP taking these medications   aspirin 81 MG EC tablet   glipiZIDE 10 MG tablet Commonly known as: GLUCOTROL     TAKE these medications   apixaban 5 MG Tabs tablet Commonly known as: Eliquis Take 1 tablet (5 mg total) by mouth 2 (two) times daily.   B-D ULTRAFINE III SHORT PEN 31G X 8 MM Misc Generic drug: Insulin Pen Needle USE TO INJECT INSULIN ONCE DAILY. DX: E11.65   cephALEXin 500 MG capsule Commonly known as: KEFLEX Take 1 capsule (500 mg total) by mouth 3 (three) times daily for 3 days. What changed: when to take this   DULoxetine 30 MG capsule Commonly known as: CYMBALTA Take 2 capsules (60 mg total) by  mouth daily.   ezetimibe 10 MG tablet Commonly known as: ZETIA Take 1 tablet (10 mg total) by mouth daily.   Lantus SoloStar 100 UNIT/ML Solostar Pen Generic drug: Insulin Glargine Inject 35 Units into the skin daily. What changed: See the new instructions.   levothyroxine 112 MCG tablet Commonly known as: SYNTHROID Take 1 tablet (112 mcg total) by mouth daily.   linagliptin 5 MG Tabs tablet Commonly known as: TRADJENTA Take 1 tablet (5 mg total) by mouth daily.   pravastatin 20 MG tablet Commonly known as: PRAVACHOL Take 1 tablet (20 mg total) by mouth daily.   trimethoprim 100 MG tablet Commonly known as: TRIMPEX Take 1 tablet (100 mg total) by mouth daily. Start after you finish Keflex (cephalexin). What changed:   how much to take  how to take this  when to take this  additional instructions   vitamin B-12 1000 MCG tablet Commonly known as: CYANOCOBALAMIN Take  1 tablet (1,000 mcg total) by mouth daily.   Vitamin D (Ergocalciferol) 1.25 MG (50000 UT) Caps capsule Commonly known as: DRISDOL Take 1 capsule (50,000 Units total) by mouth every 7 (seven) days.       Consultations:  None  Procedures/Studies:  2D Echo: None   CT HEAD WO CONTRAST  Result Date: 01/24/2019 CLINICAL DATA:  Altered level of consciousness. EXAM: CT HEAD WITHOUT CONTRAST TECHNIQUE: Contiguous axial images were obtained from the base of the skull through the vertex without intravenous contrast. COMPARISON:  06/19/2017 FINDINGS: Brain: Stable age related cerebral atrophy, ventriculomegaly and periventricular white matter disease. No extra-axial fluid collections are identified. No CT findings for acute hemispheric infarction or intracranial hemorrhage. No mass lesions. The brainstem and cerebellum are normal. Vascular: Vascular calcifications but no aneurysm or hyperdense vessels. Skull: No skull fracture or bone lesions. Sinuses/Orbits: The paranasal sinuses are clear. The mastoid air  cells are chronically hypoaerated. The middle ear cavities are clear. Other: No scalp lesions or hematoma. IMPRESSION: Stable cerebral atrophy, ventriculomegaly and periventricular white matter disease. No acute intracranial findings or mass lesions. Electronically Signed   By: Marijo Sanes M.D.   On: 01/24/2019 17:41   DG Chest Port 1 View  Result Date: 01/24/2019 CLINICAL DATA:  Recent hospitalization for COVID nineteen. Cough. Mental status. EXAM: PORTABLE CHEST 1 VIEW COMPARISON:  One-view chest x-ray 01/14/2019 FINDINGS: Heart is mildly enlarged. Opacity at the left base is chronic, likely related to pericardial fat. No new airspace disease present. There is no edema or effusion. Atherosclerotic changes are present at the aortic arch. IMPRESSION: 1. No acute cardiopulmonary disease or significant interval change. 2. Stable cardiomegaly without failure. 3. Atherosclerosis. Electronically Signed   By: San Morelle M.D.   On: 01/24/2019 15:11   DG Chest Port 1 View  Result Date: 01/14/2019 CLINICAL DATA:  Confused, possibly septic EXAM: PORTABLE CHEST 1 VIEW COMPARISON:  Radiograph 11/12/2014 FINDINGS: Streaky atelectatic changes. Attenuation in the left lung base likely corresponds to abundant pericardial fat seen on prior studies. No consolidation, features of edema, pneumothorax, or effusion. The aorta is calcified. The remaining cardiomediastinal contours are unremarkable. No acute osseous or soft tissue abnormality. IMPRESSION: 1. Atelectasis, no other acute cardiopulmonary disease. 2. Attenuation in the left lung base likely corresponds to pericardial fat, unchanged from multiple priors. 3.  Aortic Atherosclerosis (ICD10-I70.0). Electronically Signed   By: Lovena Le M.D.   On: 01/14/2019 23:32       Discharge Exam: Vitals:   01/28/19 0909 01/28/19 1217  BP:  (!) 127/59  Pulse: 67 71  Resp: 18 18  Temp:  (!) 97.2 F (36.2 C)  SpO2: 97% 97%    GENERAL: No acute distress.   Appears well.  HEENT: MMM.  Vision and hearing grossly intact.  NECK: Supple.  No apparent JVD.  RESP:  No IWOB. Good air movement bilaterally. CVS:  RRR. Heart sounds normal.  ABD/GI/GU: Bowel sounds present. Soft. Non tender.  MSK/EXT:  Moves extremities. No apparent deformity or edema.  SKIN: no apparent skin lesion or wound NEURO: Awake, alert and oriented x4.  No apparent focal neuro deficit. PSYCH: Calm. Normal affect.   The results of significant diagnostics from this hospitalization (including imaging, microbiology, ancillary and laboratory) are listed below for reference.     Microbiology: Recent Results (from the past 240 hour(s))  Culture, blood (Routine x 2)     Status: None (Preliminary result)   Collection Time: 01/24/19  1:18 PM   Specimen:  BLOOD RIGHT WRIST  Result Value Ref Range Status   Specimen Description   Final    BLOOD RIGHT WRIST Performed at [facility] [address], [place], [place] [address]    Special Requests   Final    BOTTLES DRAWN AEROBIC AND ANAEROBIC Blood Culture adequate volume Performed at [facility] [address], [place], [place] [address]    Culture   Final    NO GROWTH 4 DAYS Performed at [facility], [place] [address] [place], [place] [address]    Report Status PENDING  Incomplete  Culture, blood (Routine x 2)     Status: Abnormal   Collection Time: 01/24/19  1:26 PM   Specimen: BLOOD LEFT HAND  Result Value Ref Range Status   Specimen Description BLOOD LEFT HAND  Final   Special Requests   Final    BOTTLES DRAWN AEROBIC AND ANAEROBIC Blood Culture adequate volume   Culture  Setup Time   Final    GRAM POSITIVE COCCI IN CLUSTERS AEROBIC BOTTLE ONLY CRITICAL RESULT CALLED TO, READ BACK BY AND VERIFIED WITH: J LEGGE [id number] 01/25/19 A BROWNING    Culture (A)  Final    STAPHYLOCOCCUS SPECIES (COAGULASE NEGATIVE) THE SIGNIFICANCE OF ISOLATING THIS ORGANISM FROM A SINGLE SET OF  BLOOD CULTURES WHEN MULTIPLE SETS ARE DRAWN IS UNCERTAIN. PLEASE NOTIFY THE MICROBIOLOGY DEPARTMENT WITHIN ONE WEEK IF SPECIATION AND SENSITIVITIES ARE REQUIRED. Performed at [facility], [place] [address] [place], [place] [address]    Report Status 01/26/2019 FINAL  Final  MRSA PCR Screening     Status: None   Collection Time: 01/24/19 10:51 PM   Specimen: Nasopharyngeal  Result Value Ref Range Status   MRSA by PCR NEGATIVE NEGATIVE Final    Comment:        The GeneXpert MRSA Assay (FDA approved for NASAL specimens only), is one component of a comprehensive MRSA colonization surveillance program. It is not intended to diagnose MRSA infection nor to guide or monitor treatment for MRSA infections. Performed at [facility], [place] [address] [place], [place] [address]   Culture, Urine     Status: None   Collection Time: 01/27/19  1:43 PM   Specimen: Urine, Clean Catch  Result Value Ref Range Status   Specimen Description   Final    URINE, CLEAN CATCH Performed at [facility], [place] [address], [place], [place] [address]    Special Requests   Final    Normal Performed at [facility], [place] [address] [place], [place] [address]    Culture   Final    NO GROWTH Performed at [facility], [place] [address], [place], [place] [address]    Report Status 01/28/2019 FINAL  Final     Labs: BNP (last 3 results) No results for input(s): BNP in the last 8760 hours. Basic Metabolic Panel: Recent Labs  Lab 01/24/19 1321 01/27/19 0203 01/28/19 0310  NA 133* 140 137  K 3.6 3.8 4.2  CL 99 105 102  CO2 23 23 23   GLUCOSE 389* 191* 333*  BUN 12 17 25*  CREATININE 0.77 0.64 0.72  CALCIUM 7.8* 8.6* 8.6*  MG  --  1.9 1.8   Liver Function Tests: Recent Labs  Lab 01/24/19 1321  AST 22  ALT 19  ALKPHOS 66  BILITOT 1.0  PROT 6.2*  ALBUMIN 2.7*   No results for input(s): LIPASE, AMYLASE in the  last 168 hours. Recent  Labs  Lab 01/24/19 1722  AMMONIA 24   CBC: Recent Labs  Lab 01/24/19 1321 01/25/19 0242 01/26/19 0218 01/27/19 0203 01/28/19 0310  WBC 7.9 8.9 10.8* 10.7* 8.2  NEUTROABS 6.5 8.2* 9.8* 9.6* 7.2  HGB 11.0* 11.8* 10.4* 10.7* 11.0*  HCT 33.8* 35.4* 32.3* 33.1* 34.1*  MCV 91.8 91.5 93.4 93.8 93.2  PLT 195 205 193 197 221   Cardiac Enzymes: No results for input(s): CKTOTAL, CKMB, CKMBINDEX, TROPONINI in the last 168 hours. BNP: Invalid input(s): POCBNP CBG: Recent Labs  Lab 01/27/19 1116 01/27/19 1649 01/27/19 2006 01/28/19 0815 01/28/19 1212  GLUCAP 229* 252* 283* 318* 286*   D-Dimer Recent Labs    01/27/19 0203 01/28/19 0310  DDIMER 0.83* 1.04*   Hgb A1c No results for input(s): HGBA1C in the last 72 hours. Lipid Profile No results for input(s): CHOL, HDL, LDLCALC, TRIG, CHOLHDL, LDLDIRECT in the last 72 hours. Thyroid function studies No results for input(s): TSH, T4TOTAL, T3FREE, THYROIDAB in the last 72 hours.  Invalid input(s): FREET3 Anemia work up Recent Labs    01/27/19 0203 01/28/19 0310  FERRITIN 216 247   Urinalysis    Component Value Date/Time   COLORURINE YELLOW 01/24/2019 1321   APPEARANCEUR CLEAR 01/24/2019 1321   LABSPEC 1.028 01/24/2019 1321   PHURINE 5.0 01/24/2019 1321   GLUCOSEU >=500 (A) 01/24/2019 1321   GLUCOSEU NEGATIVE 10/08/2012 1148   HGBUR SMALL (A) 01/24/2019 1321   BILIRUBINUR NEGATIVE 01/24/2019 1321   BILIRUBINUR negative 12/18/2017 1613   KETONESUR 20 (A) 01/24/2019 1321   PROTEINUR NEGATIVE 01/24/2019 1321   UROBILINOGEN 0.2 12/18/2017 1613   UROBILINOGEN 0.2 11/01/2014 2113   NITRITE NEGATIVE 01/24/2019 1321   LEUKOCYTESUR NEGATIVE 01/24/2019 1321   Sepsis Labs Invalid input(s): PROCALCITONIN,  WBC,  LACTICIDVEN   Time coordinating discharge: 35 minutes  SIGNED:  Mercy Riding, MD  [facility] 01/28/2019, 9:23 PM  If 7PM-7AM, please contact night-coverage [email or web address] Password [email or web address]

## 2019-01-28 NOTE — TOC Progression Note (Signed)
Transition of Care [facility]) - Progression Note    Patient Details  Name: Jaclyn [name] MRN: [id number] Date of Birth: 01-Dec-1935  [facility]) CM/SW Contact  Purcell Mouton, RN Phone Number: 01/28/2019, 11:41 AM  Clinical Narrative:    Spoke with pt concerning [place]. [facility] was selected. Referral given to [facility] in house rep.   Expected Discharge Plan: [place] Barriers to Discharge: Continued Medical Work up  Expected Discharge Plan and Services Expected Discharge Plan: [place]         Expected Discharge Date: 01/28/19                                     Social Determinants of Health (SDOH) Interventions    Readmission Risk Interventions No flowsheet data found.

## 2019-01-29 LAB — CULTURE, BLOOD (ROUTINE X 2)
Culture: NO GROWTH
Special Requests: ADEQUATE

## 2019-02-02 ENCOUNTER — Telehealth: Payer: Self-pay | Admitting: Family Medicine

## 2019-02-02 NOTE — Telephone Encounter (Signed)
Melissa,[facility], called.  They received a referral for physical and occupational therapy for patient.  They're overwhelmed and first available to see patient would be 02/08/19.  Please call back to let Melissa if patient can wait until 02/08/19.

## 2019-02-03 NOTE — Telephone Encounter (Signed)
Spoke with [name] (son) and he is okay waiting to start PT/OT on 02/08/2019.  Melissa with [facility] notified by telephone okay to start therapy on 02/08/2019.

## 2019-02-03 NOTE — Telephone Encounter (Signed)
Call patient or family contact.. probably okay to wait but we can send referral elsewhere if they feel strongly about it.

## 2019-02-04 NOTE — Telephone Encounter (Signed)
Spoke with son. He states patient is doing much better.  He states she does not seem more confused than at baseline ( she has always seemed to have personality disorder, unusual behavior and some dementia at baseline)  Spoke with patient.. sounds  like she is describing urinary catheterization. She is laughing when describing procedure.  She does state that they did not obtain consent and she told them to stop. Does not seem upset.  She was confused from hospital notes when in hospital...not sure if this contributed to patient's experience. She did also state they only fed her one cracker a day and would not give her liquids in hospital. She does not want to make a formal compliant at this time, just wanted to let me know about.   She is applying Lotromin to vaginal and rectal area for yeast infection.. this has helped a lot.   Assessment: no clear acute encephalopathy.  Pt has baseline dementia and personality disorder. Unclear what occurred in hospital or if dementia/Acute encephalopthy colored patients experience. Son had no complaints about care voiced on phone .   They will come in to appt on 12/24 at 11:20 for hospital follow up. We can discuss these concerns further at that time.

## 2019-02-04 NOTE — Telephone Encounter (Signed)
Pt declined to schedule appointment She stated she had a bad UTI.   Pt declined to schedule appointment.   " She stated they were torturing [employer]  [employer] and she is going to report this when she get better.  She stated she was going to the [employer].  She said they only gave her one cracker a day and she had to beg for water

## 2019-02-09 NOTE — Telephone Encounter (Signed)
Jaclyn [name] with [facility] call in regards to the patient starting PT/OT He stated that they spoke with patient about starting this and the patient stated she was not feeling up to it this week. But could maybe start next week.  Tyle would like verbal orders that it is okay to start the PT/OT next week

## 2019-02-10 ENCOUNTER — Encounter: Payer: Self-pay | Admitting: Family Medicine

## 2019-02-10 ENCOUNTER — Ambulatory Visit (INDEPENDENT_AMBULATORY_CARE_PROVIDER_SITE_OTHER): Payer: Medicare Other | Admitting: Family Medicine

## 2019-02-10 ENCOUNTER — Other Ambulatory Visit: Payer: Self-pay

## 2019-02-10 VITALS — BP 140/62 | HR 90 | Temp 97.7°F | Ht 62.0 in | Wt 146.2 lb

## 2019-02-10 DIAGNOSIS — B3731 Acute candidiasis of vulva and vagina: Secondary | ICD-10-CM

## 2019-02-10 DIAGNOSIS — B373 Candidiasis of vulva and vagina: Secondary | ICD-10-CM

## 2019-02-10 DIAGNOSIS — E1165 Type 2 diabetes mellitus with hyperglycemia: Secondary | ICD-10-CM | POA: Diagnosis not present

## 2019-02-10 DIAGNOSIS — H9193 Unspecified hearing loss, bilateral: Secondary | ICD-10-CM

## 2019-02-10 DIAGNOSIS — N390 Urinary tract infection, site not specified: Secondary | ICD-10-CM | POA: Diagnosis not present

## 2019-02-10 DIAGNOSIS — F0390 Unspecified dementia without behavioral disturbance: Secondary | ICD-10-CM

## 2019-02-10 DIAGNOSIS — F03A0 Unspecified dementia, mild, without behavioral disturbance, psychotic disturbance, mood disturbance, and anxiety: Secondary | ICD-10-CM

## 2019-02-10 DIAGNOSIS — G9341 Metabolic encephalopathy: Secondary | ICD-10-CM

## 2019-02-10 DIAGNOSIS — E1159 Type 2 diabetes mellitus with other circulatory complications: Secondary | ICD-10-CM

## 2019-02-10 MED ORDER — SITAGLIPTIN PHOSPHATE 100 MG PO TABS: 100.0000 mg | ORAL_TABLET | Freq: Every day | ORAL | 11 refills | Status: DC

## 2019-02-10 MED ORDER — FLUCONAZOLE 150 MG PO TABS: 150.0000 mg | ORAL_TABLET | Freq: Once | ORAL | 0 refills | Status: AC

## 2019-02-10 NOTE — Progress Notes (Signed)
Chief Complaint  Patient presents with  . Hospitalization Follow-up     History of Present Illness: HPI  83 year old female presents for follow up hospitalization for mental status changes and UTI.  01/14/2019 to 01/20/2019  Acute hypoxic respiratory failure due to SARS COVID-19 viral pneumonia.  Patient was admitted to the medical ward, she received supplemental oxygen per nasal cannula, medical therapy with remdesivir and systemic corticosteroids.  She received bronchodilators and antitussive agents.  She responded well to medical therapy, her oximetry at discharge is 95 to 96% on room air.  She had no significant elevation of inflammatory markers.  2.  Acute metabolic encephalopathy in the setting of anxiety/depression..  Likely multifactorial, related to COVID-19 and urinary tract infection.  Patient received supportive medical therapy, she did require antipsychotic therapy as needed with Haldol.  Patient was seen by physical therapy, her mentation slowly improved back to its baseline.  Physical therapy recommended SNF for 24-hour supervision.  Continue Cymbalta.  3.  Urine tract infection, Klebsiella, present on admission.  Patient received antibiotic therapy with cephalosporins with good toleration.  At home will resume prophylactic Bactrim.  4.  Paroxysmal atrial fibrillation.  Patient remains sinus rhythm, continue anticoagulation with apixaban.  5.  Hypokalemia and hypomagnesemia.  Electrolytes were corrected with good toleration.  Her kidney function remained stable.  6.  Type 2 diabetes mellitus, uncontrolled, hemoglobin [id number], complicated by steroid-induced hyperglycemia.  Dyslipidemia.  Patient had aggressive therapy with insulin, at discharge she will resume metformin and glipizide, steroids have been discontinued.  Continue statin therapy.  Readmission 01/24/2019 to [id number] Acute metabolic encephalopathydue to UTI and delirium: Patient has urinary frequency,  incontinence and fever.  UA, CXR, ABG, ammonia, pro-Cal, blood cultures, MRSA PCR, CT head and neuro exam not revealing.  Urine culture negative but obtained about 24 hours after antibiotic.  Encephalopathy resolved, and she is oriented x4.   Recurrent [id number] had UTI symptoms and fever although UA is not revealing. Urine culture negative but sent after antibiotics. BCxwith coag negative staph in 1/2 bottles.  Ceftriaxone 12/7-12/11.  Discharged on Keflex for 3 more days, the she will resume her trimethoprim for prophylaxis.  Acute respiratory failure with hypoxia in the setting of recent Covid infection: desaturated to 88% requiring supplemental oxygen in ED.  Had already completed 5 days of remdesivir prior to admission.  Decadron 12/7-12/11.  She was saturating at 96% on room air on the day of discharge.  Inflammatory markers improved as below.   Uncontrolled DM-2 with hyperglycemia: A1c 11.5%. Not in DKA or HHS.  Hyperglycemia due to steroids and dietary liberation during acute illness.  Anticipate improvement of steroid.  Discharged on Lantus 35 units daily.  Added Tradjenta -Stop home glipizide.   History of CVA, paroxysmal A. fib and HLD: Stable. -Discharged on home statin, Zetia and Eliquis.  Generalized weakness/debility/physical deconditioning: PT/OT recommended SNF but patient and family declined.  Discharged with home health PT/OT.   Needs follow up CBC, BMP. mag  Today 02/10/19  SON at appointment today and very helpful with history.  Complete keflex. Started back on trimethoprim.  She is now back at baseline mental status. No issues breathing. No current dysuria.Marland Kitchen she has been urinating more frequently still though. Vaginal itchy, no vaginal discharge. Wears pad for incontinence.   Has not been on lantus given fridge messed it up.  CBGs has been running 300s.   Lab Results  Component Value Date   HGBA1C 11.5 (H) 01/24/2019  This visit occurred  during the SARS-CoV-2 public health emergency.  Safety protocols were in place, including screening questions prior to the visit, additional usage of staff PPE, and extensive cleaning of exam room while observing appropriate contact time as indicated for disinfecting solutions.   COVID 19 screen:  No recent travel or known exposure to COVID19 The patient denies respiratory symptoms of COVID 19 at this time. The importance of social distancing was discussed today.     Review of Systems  Constitutional: Positive for malaise/fatigue.  HENT: Negative for congestion and sinus pain.   Respiratory: Negative for cough and shortness of breath.   Cardiovascular: Negative for chest pain.  Gastrointestinal: Negative for abdominal pain, constipation, diarrhea, nausea and vomiting.  Genitourinary: Negative for dysuria.      Past Medical History:  Diagnosis Date  . Atrial fibrillation ([place])   . Benign neoplasm of colon   . Carotid artery occlusion    60-79% right ICA stenosis  . Carotid bruit   . Cerebrovascular disease, unspecified   . Difficult intubation 2008   During surgery to remove large polyp  . Diverticulosis of colon (without mention of hemorrhage)   . Dysthymic disorder   . Fibromyalgia   . Headache(784.0)   . Insomnia, unspecified   . Interstitial cystitis   . Myalgia and myositis, unspecified   . Osteoarthrosis, unspecified whether generalized or localized, unspecified site   . Other and unspecified hyperlipidemia   . Other chest pain   . Other specified benign mammary dysplasias   . TIA (transient ischemic attack)   . Type II or unspecified type diabetes mellitus without mention of complication, not stated as uncontrolled   . Unspecified essential hypertension   . Unspecified hypothyroidism   . Unspecified vitamin D deficiency   . Urinary tract infection, site not specified     reports that she has never smoked. She has never used smokeless tobacco. She reports that she  does not drink alcohol or use drugs.   Current Outpatient Medications:  .  apixaban (ELIQUIS) 5 MG TABS tablet, Take 1 tablet (5 mg total) by mouth 2 (two) times daily., Disp: 180 tablet, Rfl: 1 .  DULoxetine (CYMBALTA) 30 MG capsule, Take 2 capsules (60 mg total) by mouth daily., Disp: 180 capsule, Rfl: 1 .  glipiZIDE (GLUCOTROL) 10 MG tablet, Take 10 mg by mouth daily before breakfast. , Disp: , Rfl:  .  Insulin Glargine (LANTUS SOLOSTAR) 100 UNIT/ML Solostar Pen, Inject 35 Units into the skin daily., Disp: 15 mL, Rfl: 2 .  Insulin Pen Needle (B-D ULTRAFINE III SHORT PEN) 31G X 8 MM MISC, USE TO INJECT INSULIN ONCE DAILY. DX: E11.65, Disp: 100 each, Rfl: 0 .  levothyroxine (SYNTHROID) 112 MCG tablet, Take 1 tablet (112 mcg total) by mouth daily., Disp: 90 tablet, Rfl: 3 .  trimethoprim (TRIMPEX) 100 MG tablet, Take 1 tablet (100 mg total) by mouth daily. Start after you finish Keflex (cephalexin)., Disp: , Rfl:  .  vitamin B-12 (CYANOCOBALAMIN) 1000 MCG tablet, Take 1 tablet (1,000 mcg total) by mouth daily., Disp: 30 tablet, Rfl: 11 .  Vitamin D, Ergocalciferol, (DRISDOL) 1.25 MG (50000 UT) CAPS capsule, Take 1 capsule (50,000 Units total) by mouth every 7 (seven) days., Disp: 12 capsule, Rfl: 0 .  ezetimibe (ZETIA) 10 MG tablet, Take 1 tablet (10 mg total) by mouth daily. (Patient not taking: Reported on 01/24/2019), Disp: 90 tablet, Rfl: 3 .  linagliptin (TRADJENTA) 5 MG TABS tablet, Take 1 tablet (5  mg total) by mouth daily. (Patient not taking: Reported on 02/10/2019), Disp: 90 tablet, Rfl: 1 .  pravastatin (PRAVACHOL) 20 MG tablet, Take 1 tablet (20 mg total) by mouth daily. (Patient not taking: Reported on 01/24/2019), Disp: 90 tablet, Rfl: 3   Observations/Objective: Blood pressure 140/62, pulse 90, temperature 97.7 F (36.5 C), temperature source Temporal, height 5\' 2"  (1.575 m), weight 146 lb 4 oz (66.3 kg), SpO2 95 %.  Physical Exam Constitutional:      General: She is not in acute  distress.    Appearance: Normal appearance. She is well-developed. She is not ill-appearing or toxic-appearing.  HENT:     Head: Normocephalic.     Right Ear: Hearing, tympanic membrane, ear canal and external ear normal.     Left Ear: Hearing, tympanic membrane, ear canal and external ear normal.     Nose: Nose normal.  Eyes:     General: Lids are normal. Lids are everted, no foreign bodies appreciated.     Conjunctiva/sclera: Conjunctivae normal.     Pupils: Pupils are equal, round, and reactive to light.  Neck:     Thyroid: No thyroid mass or thyromegaly.     Vascular: No carotid bruit.     Trachea: Trachea normal.  Cardiovascular:     Rate and Rhythm: Normal rate and regular rhythm.     Heart sounds: Normal heart sounds, S1 normal and S2 normal. No murmur. No gallop.   Pulmonary:     Effort: Pulmonary effort is normal. No respiratory distress.     Breath sounds: Normal breath sounds. No wheezing, rhonchi or rales.  Abdominal:     General: Bowel sounds are normal. There is no distension or abdominal bruit.     Palpations: Abdomen is soft. There is no fluid wave or mass.     Tenderness: There is no abdominal tenderness. There is no guarding or rebound.     Hernia: No hernia is present.  Musculoskeletal:     Cervical back: Normal range of motion and neck supple.  Lymphadenopathy:     Cervical: No cervical adenopathy.  Skin:    General: Skin is warm and dry.     Findings: No rash.  Neurological:     Mental Status: She is alert.     Cranial Nerves: No cranial nerve deficit.     Sensory: No sensory deficit.  Psychiatric:        Mood and Affect: Mood is not anxious or depressed.        Speech: Speech normal.        Behavior: Behavior normal. Behavior is cooperative.        Judgment: Judgment normal.      Assessment and Plan   Poorly controlled diabetes mellitus (HCC) Restart Lantus 35 units daily. Start [place] daily.   Acute lower UTI Complete antibitoics.  Acute  metabolic encephalopathy Now back at baseline mild dementia. Encouraged family that she will need help with self care and meds. Asked family to come to all appointments with her.  Vagina, candidiasis We will treat vaginal yeast with fluconazole x 1 tablet.   Mild dementia ([place]) Needs help with med administration. Discussed with family. Poor hearing contributing.. referral to audiologist requested by son.     Eliezer Lofts, MD

## 2019-02-10 NOTE — Patient Instructions (Addendum)
Restart Lantus 35 units daily. Start [place] daily. Continue trimethoprim. We will call to set up audiology referral.  Please stop at the lab to have labs drawn.  We will treat vaginal yeast with fluconazole x 1 tablet.

## 2019-02-10 NOTE — Telephone Encounter (Signed)
Okay to start next week.

## 2019-02-10 NOTE — Telephone Encounter (Signed)
Spoke with Melissa at [facility] and gave verbal orders: okay to start PT/OT next week per Dr. Diona Browner.

## 2019-02-11 LAB — BASIC METABOLIC PANEL
BUN: 7 mg/dL (ref 7–25)
CO2: 25 mmol/L (ref 20–32)
Calcium: 9.2 mg/dL (ref 8.6–10.4)
Chloride: 99 mmol/L (ref 98–110)
Creat: 0.81 mg/dL (ref 0.60–0.88)
Glucose, Bld: 490 mg/dL — ABNORMAL HIGH (ref 65–99)
Potassium: 4.3 mmol/L (ref 3.5–5.3)
Sodium: 135 mmol/L (ref 135–146)

## 2019-02-11 LAB — CBC WITH DIFFERENTIAL/PLATELET
Absolute Monocytes: 451 cells/uL (ref 200–950)
Basophils Absolute: 31 cells/uL (ref 0–200)
Basophils Relative: 0.5 %
Eosinophils Absolute: 79 cells/uL (ref 15–500)
Eosinophils Relative: 1.3 %
HCT: 34.8 % — ABNORMAL LOW (ref 35.0–45.0)
Hemoglobin: 11.5 g/dL — ABNORMAL LOW (ref 11.7–15.5)
Lymphs Abs: 1251 cells/uL (ref 850–3900)
MCH: 30.3 pg (ref 27.0–33.0)
MCHC: 33 g/dL (ref 32.0–36.0)
MCV: 91.8 fL (ref 80.0–100.0)
MPV: 10.3 fL (ref 7.5–12.5)
Monocytes Relative: 7.4 %
Neutro Abs: 4288 cells/uL (ref 1500–7800)
Neutrophils Relative %: 70.3 %
Platelets: 274 10*3/uL (ref 140–400)
RBC: 3.79 10*6/uL — ABNORMAL LOW (ref 3.80–5.10)
RDW: 13.7 % (ref 11.0–15.0)
Total Lymphocyte: 20.5 %
WBC: 6.1 10*3/uL (ref 3.8–10.8)

## 2019-02-11 LAB — MAGNESIUM: Magnesium: 1.5 mg/dL (ref 1.5–2.5)

## 2019-02-14 ENCOUNTER — Telehealth: Payer: Self-pay | Admitting: Radiology

## 2019-02-14 ENCOUNTER — Telehealth: Payer: Self-pay | Admitting: *Deleted

## 2019-02-14 NOTE — Telephone Encounter (Signed)
Patient's granddaughter [name] [place]) notified as instructed in person while she was at the office. Carma Lair that her grandmother's instructions were printed out and given to her on an AVS at her office visit.    Reviewed the instructions with Lexine Baton that her grandmother were given at her last office visit and she stopped at the front desk to schedule the follow-up appointment for her grandmother on a day that she can bring her.

## 2019-02-14 NOTE — Telephone Encounter (Signed)
Spoke to patient's granddaughter Jaclyn [name] (on [place]) and was advised that patient was in the office to see Dr. Diona Browner Thursday 02/10/19. Jaclyn [name] stated that the patient's son Jaclyn [name] came with her which he had tested positive for covid and was to be in quarantine until today .Jaclyn [name] stated that she can bring her grandmother into the office or do a virtual because she was cleared today to be off quarantine. Jaclyn [name] stated that she would like to get involved with what medications her grandmother should be taking and help out more with her care. Jaclyn [name] stated that she has an appointment scheduled with Dr. Silvio Pate today for a CPE and is leaving now for that appointment. [name] that we will be in touch about getting an appointment set up with Dr. Diona Browner.

## 2019-02-14 NOTE — Telephone Encounter (Signed)
Spoke to [name] and was advised that Jaclyn [name] was tested on 01/31/19 and was informed on 02/02/19 that he was positive for covid. [name] stated that he was to be in quarantine until today 02/14/19 and was able to return to work today. Will relay the rest of the message to [name] when she is available later today.

## 2019-02-14 NOTE — Telephone Encounter (Signed)
[facility] called a critical Glucose, results given to [facility].

## 2019-02-14 NOTE — Telephone Encounter (Signed)
Has pt been checking her sugars at home? What are they running. Elevated due to steroids prescribed for COVID

## 2019-02-14 NOTE — Telephone Encounter (Signed)
Opened in error. See other phone  note 

## 2019-02-14 NOTE — Telephone Encounter (Signed)
Okay... Now  CDC okay with 10 days from date of positive test... so even though he was told 14 days... on 12/24 he was okay to be in office. No need to test or consider as an exposure to office staff I believe. Plus he and staff , myself included, were masked/ goggled at all times.    Mandy, Feel free to check with [facility] if  there is any question/concern about my statement.

## 2019-02-14 NOTE — Telephone Encounter (Signed)
Pt was off insulin at time of labs. Now back on insulin, also at OV  ( day of labs) she was started on januvia. This should be a start... no OV needed now. I believe at [place] on 12/24.Marland Kitchen pt was set up for follow up. If taking insulina nd Tonga as instructed at [place]... sugar should be coming down. Needs to be checking CBGs daily and recording to bring to next OV.  As far as positive COVID status... pt was positive in hospital on 01/15/2019.Marland Kitchen pt was out of quarantine at time of appt.  Clarify what granddaughter is saying... when was Montel Culver tested positive?

## 2019-02-14 NOTE — Telephone Encounter (Signed)
Can she make an appt with [facility] for tomorrow to reevaluate. She needs more medication. Glucose this high can lead to dehydration, AMS. If she can not get in with PCP, would recommend she go back to the ER for glucose control.

## 2019-02-14 NOTE — Telephone Encounter (Signed)
Spoke to patient and was advised that she has not routinely been checking her blood sugar and does not know when she last checked it. Patient checked her blood sugar while on the phone and she got "High" no numbers. Patient's granddaughter was with her and she said that when she left the hospital she was given a script for Januvia and she never got it filled because it was going to be close to $1200. Patient's granddaughter stated that she has just been doing what she was doing before she went to the hospital and her blood sugar has been out of control.

## 2019-02-21 ENCOUNTER — Other Ambulatory Visit: Payer: Self-pay

## 2019-02-22 ENCOUNTER — Encounter: Payer: Self-pay | Admitting: Family Medicine

## 2019-02-22 ENCOUNTER — Ambulatory Visit (INDEPENDENT_AMBULATORY_CARE_PROVIDER_SITE_OTHER): Payer: Medicare Other | Admitting: Family Medicine

## 2019-02-22 DIAGNOSIS — E118 Type 2 diabetes mellitus with unspecified complications: Secondary | ICD-10-CM | POA: Diagnosis not present

## 2019-02-22 DIAGNOSIS — E1165 Type 2 diabetes mellitus with hyperglycemia: Secondary | ICD-10-CM | POA: Diagnosis not present

## 2019-02-22 DIAGNOSIS — B373 Candidiasis of vulva and vagina: Secondary | ICD-10-CM

## 2019-02-22 DIAGNOSIS — B3731 Acute candidiasis of vulva and vagina: Secondary | ICD-10-CM

## 2019-02-22 MED ORDER — TRIMETHOPRIM 100 MG PO TABS: 100.0000 mg | ORAL_TABLET | Freq: Every day | ORAL | 3 refills | Status: DC

## 2019-02-22 NOTE — Progress Notes (Signed)
VIRTUAL VISIT Due to national recommendations of social distancing due to [place] 19, a virtual visit is felt to be most appropriate for this patient at this time.   I connected with the patient on 02/22/19 at  9:40 AM EST by virtual telehealth platform and verified that I am speaking with the correct person using two identifiers.   I discussed the limitations, risks, security and privacy concerns of performing an evaluation and management service by  virtual telehealth platform and the availability of in person appointments. I also discussed with the patient that there may be a patient responsible charge related to this service. The patient expressed understanding and agreed to proceed.  Patient location: Home Provider Location: [place] Participants: Eliezer Lofts and Hilma Favors   Chief Complaint  Patient presents with  . Follow-up    History of Present Illness:    84 year old female with dementia poorly controlled diabetes presents for follow up.  Grand-daughter Altamese Dilling) is present for virtual visit.  At last OV on 12/24:  Insulin 35 units was restarted, [place] was added.  CBGs running   292 down from 500s  no lows < 60.  Lab Results  Component Value Date   HGBA1C 11.5 (H) 01/24/2019     Vaginal yeast infection treated with fluconazole.. trimethoprim UTI prophylactic was continued. Improved.   She refuses to take any cholesterol medication.  COVID 19 screen No recent travel or known exposure to COVID19 The patient denies respiratory symptoms of COVID 19 at this time.  The importance of social distancing was discussed today.   Review of Systems  Constitutional: Negative for chills and fever.  HENT: Negative for congestion and ear pain.   Eyes: Negative for pain and redness.  Respiratory: Negative for cough and shortness of breath.   Cardiovascular: Negative for chest pain, palpitations and leg swelling.  Gastrointestinal: Negative for abdominal  pain, blood in stool, constipation, diarrhea, nausea and vomiting.  Genitourinary: Negative for dysuria.  Musculoskeletal: Negative for falls and myalgias.  Skin: Negative for rash.  Neurological: Negative for dizziness.  Psychiatric/Behavioral: Negative for depression. The patient is not nervous/anxious.       Past Medical History:  Diagnosis Date  . Atrial fibrillation ([place])   . Benign neoplasm of colon   . Carotid artery occlusion    60-79% right ICA stenosis  . Carotid bruit   . Cerebrovascular disease, unspecified   . Difficult intubation 2008   During surgery to remove large polyp  . Diverticulosis of colon (without mention of hemorrhage)   . Dysthymic disorder   . Fibromyalgia   . Headache(784.0)   . Insomnia, unspecified   . Interstitial cystitis   . Myalgia and myositis, unspecified   . Osteoarthrosis, unspecified whether generalized or localized, unspecified site   . Other and unspecified hyperlipidemia   . Other chest pain   . Other specified benign mammary dysplasias   . TIA (transient ischemic attack)   . Type II or unspecified type diabetes mellitus without mention of complication, not stated as uncontrolled   . Unspecified essential hypertension   . Unspecified hypothyroidism   . Unspecified vitamin D deficiency   . Urinary tract infection, site not specified     reports that she has never smoked. She has never used smokeless tobacco. She reports that she does not drink alcohol or use drugs.   Current Outpatient Medications:  .  apixaban (ELIQUIS) 5 MG TABS tablet, Take 1 tablet (5 mg total) by  mouth 2 (two) times daily., Disp: 180 tablet, Rfl: 1 .  DULoxetine (CYMBALTA) 30 MG capsule, Take 2 capsules (60 mg total) by mouth daily., Disp: 180 capsule, Rfl: 1 .  Insulin Glargine (LANTUS SOLOSTAR) 100 UNIT/ML Solostar Pen, Inject 35 Units into the skin daily., Disp: 15 mL, Rfl: 2 .  Insulin Pen Needle (B-D ULTRAFINE III SHORT PEN) 31G X 8 MM MISC, USE TO INJECT  INSULIN ONCE DAILY. DX: E11.65, Disp: 100 each, Rfl: 0 .  levothyroxine (SYNTHROID) 112 MCG tablet, Take 1 tablet (112 mcg total) by mouth daily., Disp: 90 tablet, Rfl: 3 .  sitaGLIPtin (JANUVIA) 100 MG tablet, Take 1 tablet (100 mg total) by mouth daily., Disp: 30 tablet, Rfl: 11 .  trimethoprim (TRIMPEX) 100 MG tablet, Take 1 tablet (100 mg total) by mouth daily. Start after you finish Keflex (cephalexin)., Disp: , Rfl:  .  vitamin B-12 (CYANOCOBALAMIN) 1000 MCG tablet, Take 1 tablet (1,000 mcg total) by mouth daily., Disp: 30 tablet, Rfl: 11 .  Vitamin D, Ergocalciferol, (DRISDOL) 1.25 MG (50000 UT) CAPS capsule, Take 1 capsule (50,000 Units total) by mouth every 7 (seven) days., Disp: 12 capsule, Rfl: 0 .  ezetimibe (ZETIA) 10 MG tablet, Take 1 tablet (10 mg total) by mouth daily. (Patient not taking: Reported on 01/24/2019), Disp: 90 tablet, Rfl: 3 .  pravastatin (PRAVACHOL) 20 MG tablet, Take 1 tablet (20 mg total) by mouth daily. (Patient not taking: Reported on 01/24/2019), Disp: 90 tablet, Rfl: 3   Observations/Objective: Height 5\' 2"  (1.575 m).  Physical Exam  Physical Exam Constitutional:      General: The patient is not in acute distress. Pulmonary:     Effort: Pulmonary effort is normal. No respiratory distress.  Neurological:     Mental Status: The patient is alert and oriented to person, place, and time.  Psychiatric:        Mood and Affect: Mood normal.        Behavior: Behavior normal.   Assessment and Plan    I discussed the assessment and treatment plan with the patient. The patient was provided an opportunity to ask questions and all were answered. The patient agreed with the plan and demonstrated an understanding of the instructions.   The patient was advised to call back or seek an in-person evaluation if the symptoms worsen or if the condition fails to improve as anticipated.     Eliezer Lofts, MD

## 2019-02-22 NOTE — Progress Notes (Signed)
Medication list updated.

## 2019-02-22 NOTE — Patient Instructions (Addendum)
Increase lantus to 50 units daily. Continue Januvia.  Send via MyChart fasting blood sugar  ( goal < 120 too low < 60)  And 2 hours after meals (goal < 180).  Call to make follow up in 2.5 months with labs prior DM.

## 2019-02-22 NOTE — Telephone Encounter (Signed)
Appointment was changed to virtual. FYI to PCP

## 2019-03-02 NOTE — Telephone Encounter (Signed)
Patient returned called  Advised of message below. She stated she would like [facility]. Friday evenings are best because her sone would have to take her.   She stated she is really not wanting to schedule these appointments right now because she has trouble walking and to much going on in her life.

## 2019-03-04 NOTE — Telephone Encounter (Signed)
LMOM and sent MyChart.

## 2019-03-07 NOTE — Telephone Encounter (Signed)
Pt returned Jaclyn [name]'s call. I relayed the message to her regarding her Audiologist referral. She states she does not want to go to these appointments right now, so I gave her the number for the audiologist and advised her to give them a call so they can cancel the appointment.

## 2019-03-10 DIAGNOSIS — B373 Candidiasis of vulva and vagina: Secondary | ICD-10-CM | POA: Insufficient documentation

## 2019-03-10 DIAGNOSIS — B3731 Acute candidiasis of vulva and vagina: Secondary | ICD-10-CM | POA: Insufficient documentation

## 2019-03-10 DIAGNOSIS — F0390 Unspecified dementia without behavioral disturbance: Secondary | ICD-10-CM | POA: Insufficient documentation

## 2019-03-10 NOTE — Assessment & Plan Note (Signed)
Needs help with med administration. Discussed with family. Poor hearing contributing.. referral to audiologist requested by son.

## 2019-03-10 NOTE — Assessment & Plan Note (Signed)
We will treat vaginal yeast with fluconazole x 1 tablet.

## 2019-03-10 NOTE — Assessment & Plan Note (Signed)
Restart Lantus 35 units daily. Start [place] daily.

## 2019-03-10 NOTE — Assessment & Plan Note (Signed)
Now back at baseline mild dementia. Encouraged family that she will need help with self care and meds. Asked family to come to all appointments with her.

## 2019-03-10 NOTE — Assessment & Plan Note (Signed)
Complete antibitoics. 

## 2019-03-11 ENCOUNTER — Telehealth: Payer: Self-pay

## 2019-03-11 NOTE — Telephone Encounter (Signed)
For 1 wk on and off pt has had frequency of urine;after pt urinates pt has burning in perineal area; pt said "she does not feel right down there." no burning and pain upon urination.no fever and no abd pain. Pt does have lower back pain on both sides. Pt said she hurts all over.  Pt cked FBS on 03/10/19 and FBS was 213. Pt has not cked BS today due to being out of test strips but pt said she or her son will pick up test strips at pharmacy. Pt said she cannot go anywhere tomorrow but she will have her son take her to [facility] on [address] in [place] on 03/12/19. UC & ED precautions given and pt voiced understanding. FYI to Dr Diona Browner.

## 2019-03-11 NOTE — Telephone Encounter (Signed)
Noted. Agree pt need in person eval today.

## 2019-03-15 ENCOUNTER — Encounter: Payer: Self-pay | Admitting: Family Medicine

## 2019-03-15 ENCOUNTER — Ambulatory Visit (INDEPENDENT_AMBULATORY_CARE_PROVIDER_SITE_OTHER): Payer: Medicare Other | Admitting: Family Medicine

## 2019-03-15 ENCOUNTER — Other Ambulatory Visit: Payer: Self-pay

## 2019-03-15 VITALS — Ht 62.0 in

## 2019-03-15 DIAGNOSIS — E1165 Type 2 diabetes mellitus with hyperglycemia: Secondary | ICD-10-CM

## 2019-03-15 DIAGNOSIS — R300 Dysuria: Secondary | ICD-10-CM

## 2019-03-15 DIAGNOSIS — Z91199 Patient's noncompliance with other medical treatment and regimen due to unspecified reason: Secondary | ICD-10-CM

## 2019-03-15 DIAGNOSIS — E118 Type 2 diabetes mellitus with unspecified complications: Secondary | ICD-10-CM

## 2019-03-15 DIAGNOSIS — M797 Fibromyalgia: Secondary | ICD-10-CM

## 2019-03-15 DIAGNOSIS — Z9119 Patient's noncompliance with other medical treatment and regimen: Secondary | ICD-10-CM

## 2019-03-15 MED ORDER — LANTUS SOLOSTAR 100 UNIT/ML ~~LOC~~ SOPN: 65.0000 [IU] | PEN_INJECTOR | Freq: Every day | SUBCUTANEOUS | 11 refills | Status: DC

## 2019-03-15 NOTE — Progress Notes (Signed)
VIRTUAL VISIT Due to national recommendations of social distancing due to [place], a virtual visit is felt to be most appropriate for this patient at this time.   I connected with the patient on 03/15/19 at  3:00 PM EST by virtual telehealth platform and verified that I am speaking with the correct person using two identifiers.   I discussed the limitations, risks, security and privacy concerns of performing an evaluation and management service by  virtual telehealth platform and the availability of in person appointments. I also discussed with the patient that there may be a patient responsible charge related to this service. The patient expressed understanding and agreed to proceed.  Patient location: Home Provider Location: [facility] [place] Participants: Eliezer Lofts and Hilma Favors   Chief Complaint  Patient presents with  . Urinary Tract Infection    Someone will bring urine sample tomorrow    History of Present Illness:  84 year old female with mild dementia, hardo of hearing  poorly controlled DM and recurrent UTI presents with new onset  Increase in urinary frequency, intermittent burning in perineal area after urinating No burning or abd pain with urination. Has bilateral low back pain and hurts all over.  Having some increase frequency and urgency.Marland Kitchen wearing diaper. No blood in urine. No fever.   Daughter and grand-daughter are helping with care and are present on call.  They will bring in urine sample from patient tommorow to send for UA, micro and culture.   Continued issues with getting  Diabetes controlled. FBS lately now  back on  Insulin at 50 units daily, continued Januvia. FBS: 273, no longer in 500s since restarting insulin, but again has been out x 2 days No low blood sugars.  Family is monitoring her medications to improve compliance. She is having to pay 500 dollars a month for her meds. Lantus, januvia and eliquis are expensive She has been off Lantus  several days given ran out and could not refill.   She feels like she is  feeling stiff and painful in joints and muscles.  History of fibromyalgia on cymbalta.  COVID 19 screen No recent travel or known exposure to COVID19 The patient denies respiratory symptoms of COVID 19 at this time.  The importance of social distancing was discussed today.   Review of Systems  Constitutional: Positive for malaise/fatigue. Negative for chills and fever.  HENT: Negative for congestion and ear pain.   Eyes: Negative for pain and redness.  Respiratory: Negative for cough and shortness of breath.   Cardiovascular: Negative for chest pain, palpitations and leg swelling.  Gastrointestinal: Negative for abdominal pain, blood in stool, constipation, diarrhea, nausea and vomiting.  Genitourinary: Positive for dysuria.  Musculoskeletal: Positive for back pain and myalgias. Negative for falls.  Skin: Negative for rash.  Neurological: Negative for dizziness.  Psychiatric/Behavioral: Negative for depression. The patient is not nervous/anxious.       Past Medical History:  Diagnosis Date  . Atrial fibrillation ([place])   . Benign neoplasm of colon   . Carotid artery occlusion    60-79% right ICA stenosis  . Carotid bruit   . Cerebrovascular disease, unspecified   . Difficult intubation 2008   During surgery to remove large polyp  . Diverticulosis of colon (without mention of hemorrhage)   . Dysthymic disorder   . Fibromyalgia   . Headache(784.0)   . Insomnia, unspecified   . Interstitial cystitis   . Myalgia and myositis, unspecified   . Osteoarthrosis,  unspecified whether generalized or localized, unspecified site   . Other and unspecified hyperlipidemia   . Other chest pain   . Other specified benign mammary dysplasias   . TIA (transient ischemic attack)   . Type II or unspecified type diabetes mellitus without mention of complication, not stated as uncontrolled   . Unspecified essential  hypertension   . Unspecified hypothyroidism   . Unspecified vitamin D deficiency   . Urinary tract infection, site not specified     reports that she has never smoked. She has never used smokeless tobacco. She reports that she does not drink alcohol or use drugs.   Current Outpatient Medications:  .  apixaban (ELIQUIS) 5 MG TABS tablet, Take 1 tablet (5 mg total) by mouth 2 (two) times daily., Disp: 180 tablet, Rfl: 1 .  DULoxetine (CYMBALTA) 30 MG capsule, Take 2 capsules (60 mg total) by mouth daily., Disp: 180 capsule, Rfl: 1 .  Insulin Glargine (LANTUS SOLOSTAR) 100 UNIT/ML Solostar Pen, Inject 50 Units into the skin daily., Disp: , Rfl:  .  Insulin Pen Needle (B-D ULTRAFINE III SHORT PEN) 31G X 8 MM MISC, USE TO INJECT INSULIN ONCE DAILY. DX: E11.65, Disp: 100 each, Rfl: 0 .  levothyroxine (SYNTHROID) 112 MCG tablet, Take 1 tablet (112 mcg total) by mouth daily., Disp: 90 tablet, Rfl: 3 .  sitaGLIPtin (JANUVIA) 100 MG tablet, Take 1 tablet (100 mg total) by mouth daily., Disp: 30 tablet, Rfl: 11 .  trimethoprim (TRIMPEX) 100 MG tablet, Take 1 tablet (100 mg total) by mouth daily., Disp: 90 tablet, Rfl: 3 .  vitamin B-12 (CYANOCOBALAMIN) 1000 MCG tablet, Take 1 tablet (1,000 mcg total) by mouth daily., Disp: 30 tablet, Rfl: 11 .  Vitamin D, Ergocalciferol, (DRISDOL) 1.25 MG (50000 UT) CAPS capsule, Take 1 capsule (50,000 Units total) by mouth every 7 (seven) days., Disp: 12 capsule, Rfl: 0   Observations/Objective: Height 5\' 2"  (1.575 m).  Physical Exam  Physical Exam Constitutional:      General: The patient is not in acute distress. Pulmonary:     Effort: Pulmonary effort is normal. No respiratory distress.  Neurological:     Mental Status: The patient is alert and oriented to person, place, and time.  Psychiatric:        Mood and Affect: Mood normal.        Behavior: Behavior normal.   Assessment and Plan Dysuria Possible UTi in pt with history of frequent UTI.Marland Kitchen on  trimethoprim for prophylaxis.  Poorly controlled type 2 diabetes mellitus with complication [facility]) Discussed options.. family and pt would like to stay on Lantus if they can get help with cost coverage.  Will refer to pharmacist for medication assistance as well as help to encourage compliance with regimen.  For now will increase lantus to 65 units daily. Continue januvia for now, but this is costly for her too. Could consider D/C januvia  And trial of GLP1 Ra but this may be costly as well.  No longer on metformin  given age D/C'd in hospital... although renal function is great/tolerated in past... may consider adding back low dose metformin as insulin sensitizer.  No longer on glipizide.. not likely helpful and high risk given age.   Follow CBGs and follow up in 2 weeks.  Referred to ENDO in past.. pt does not wish to return at this point given transportation issues.   Fibromyalgia  Likely in part causing stiffness. Encouraged pt to get back to walking and increase activity at  home. Not interested in PT referral given pandemic.       I discussed the assessment and treatment plan with the patient. The patient was provided an opportunity to ask questions and all were answered. The patient agreed with the plan and demonstrated an understanding of the instructions.   The patient was advised to call back or seek an in-person evaluation if the symptoms worsen or if the condition fails to improve as anticipated.     Eliezer Lofts, MD

## 2019-03-15 NOTE — Assessment & Plan Note (Signed)
Discussed options.. family and pt would like to stay on Lantus if they can get help with cost coverage.  Will refer to pharmacist for medication assistance as well as help to encourage compliance with regimen.  For now will increase lantus to 65 units daily. Continue januvia for now, but this is costly for her too. Could consider D/C januvia  And trial of GLP1 Ra but this may be costly as well.  No longer on metformin  given age D/C'd in hospital... although renal function is great/tolerated in past... may consider adding back low dose metformin as insulin sensitizer.  No longer on glipizide.. not likely helpful and high risk given age.   Follow CBGs and follow up in 2 weeks.  Referred to ENDO in past.. pt does not wish to return at this point given transportation issues.

## 2019-03-15 NOTE — Patient Instructions (Addendum)
Drop of a urine sample as possible. Increase water intake. We will get set up with the pharmcist to discuss how to make medication more affordable. Increase Lantus to 65 units  at night. Start gentle stretching and regular exercise.

## 2019-03-15 NOTE — Assessment & Plan Note (Signed)
Likely in part causing stiffness. Encouraged pt to get back to walking and increase activity at home. Not interested in PT referral given pandemic.

## 2019-03-15 NOTE — Progress Notes (Signed)
Future orders placed in Epic.

## 2019-03-15 NOTE — Assessment & Plan Note (Signed)
Possible UTi in pt with history of frequent UTI.Marland Kitchen on trimethoprim for prophylaxis.

## 2019-03-16 ENCOUNTER — Other Ambulatory Visit (INDEPENDENT_AMBULATORY_CARE_PROVIDER_SITE_OTHER): Payer: Medicare Other

## 2019-03-16 ENCOUNTER — Other Ambulatory Visit: Payer: Self-pay

## 2019-03-16 DIAGNOSIS — R300 Dysuria: Secondary | ICD-10-CM

## 2019-03-16 LAB — URINALYSIS, ROUTINE W REFLEX MICROSCOPIC
Bilirubin Urine: NEGATIVE
Hgb urine dipstick: NEGATIVE
Ketones, ur: NEGATIVE
Leukocytes,Ua: NEGATIVE
Nitrite: NEGATIVE
RBC / HPF: NONE SEEN (ref 0–?)
Specific Gravity, Urine: 1.015 (ref 1.000–1.030)
Total Protein, Urine: NEGATIVE
Urine Glucose: >=1000 — AB
Urobilinogen, UA: 0.2 (ref 0.0–1.0)
pH: 6 (ref 5.0–8.0)

## 2019-03-17 LAB — URINE CULTURE
MICRO NUMBER:: 10086959
SPECIMEN QUALITY:: ADEQUATE

## 2019-03-17 NOTE — Assessment & Plan Note (Signed)
Increase lantus to 50 units daily. Continue Januvia.  Send via MyChart fasting blood sugar  ( goal < 120 too low < 60)  And 2 hours after meals (goal < 180).

## 2019-03-17 NOTE — Assessment & Plan Note (Signed)
Per pt improved on fluconazole.

## 2019-03-24 ENCOUNTER — Telehealth: Payer: Self-pay | Admitting: Family Medicine

## 2019-03-28 NOTE — Progress Notes (Signed)
Unsuccessful outreach today in response to referral by Jinny Sanders, MD for CCM/Care coordination services.  Will attempt outreach again in 7 days.  [name] [employer] Scheduler

## 2019-04-18 ENCOUNTER — Telehealth: Payer: Self-pay | Admitting: Family Medicine

## 2019-04-18 NOTE — Progress Notes (Signed)
  Chronic Care Management   Outreach Note  04/18/2019 Name: Jaclyn [name] MRN: [id number] DOB: 10-21-1935  Referred by: Jinny Sanders, MD Reason for referral : No chief complaint on file.   An unsuccessful telephone outreach was attempted today. The patient was referred to the pharmacist for assistance with care management and care coordination.   Follow Up Plan:   [name] [employer] Scheduler

## 2019-05-16 NOTE — Telephone Encounter (Signed)
Pt had HFU on 02/10/19.

## 2019-06-03 ENCOUNTER — Telehealth: Payer: Self-pay | Admitting: Family Medicine

## 2019-06-03 NOTE — Progress Notes (Signed)
  Chronic Care Management   Outreach Note  06/03/2019 Name: Jaclyn [name] MRN: [id number] DOB: 11-14-1935  Referred by: Jinny Sanders, MD Reason for referral : No chief complaint on file.   A second unsuccessful telephone outreach was attempted today. The patient was referred to pharmacist for assistance with care management and care coordination.  Follow Up Plan:   [name] [employer] Scheduler

## 2019-06-23 ENCOUNTER — Telehealth: Payer: Self-pay | Admitting: Family Medicine

## 2019-06-23 NOTE — Progress Notes (Signed)
  Chronic Care Management   Outreach Note  06/23/2019 Name: Sparrow Fedak MRN: [id number] DOB: 1935/03/10  Referred by: Jinny Sanders, MD Reason for referral : No chief complaint on file.   A second unsuccessful telephone outreach was attempted today. The patient was referred to pharmacist for assistance with care management and care coordination.   This note is not being shared with the patient for the following reason: To respect privacy (The patient or proxy has requested that the information not be shared).  Follow Up Plan:   [name] [employer] Scheduler

## 2019-06-23 NOTE — Progress Notes (Signed)
°  Chronic Care Management   Outreach Note  06/23/2019 Name: Jaclyn [name] MRN: [id number] DOB: 12-23-1935  Referred by: Jinny Sanders, MD Reason for referral : No chief complaint on file.   Third unsuccessful telephone outreach was attempted today. The patient was referred to the pharmacist for assistance with care management and care coordination.    This note is not being shared with the patient for the following reason: To respect privacy (The patient or proxy has requested that the information not be shared).  Follow Up Plan:   [name] [employer] Scheduler

## 2019-06-28 ENCOUNTER — Other Ambulatory Visit: Payer: Self-pay | Admitting: Family Medicine

## 2019-06-29 ENCOUNTER — Other Ambulatory Visit: Payer: Self-pay | Admitting: *Deleted

## 2019-06-29 MED ORDER — APIXABAN 5 MG PO TABS: 5.0000 mg | ORAL_TABLET | Freq: Two times a day (BID) | ORAL | 1 refills | Status: DC

## 2019-06-29 MED ORDER — DULOXETINE HCL 30 MG PO CPEP: 60.0000 mg | ORAL_CAPSULE | Freq: Every day | ORAL | 1 refills | Status: DC

## 2019-06-29 NOTE — Telephone Encounter (Signed)
Last office visit 03/15/2019 for dysuria.  Last refilled Eliquis 09/10/2018 for #180 with 1 refill.  Duloxetine 01/28/2019 for #180 with 1 refill.   AVS at 03/15/2019 stated to follow up in 2 weeks for multiple issues.  No future appointments.

## 2019-08-19 ENCOUNTER — Ambulatory Visit: Payer: Medicare Other | Admitting: Family Medicine

## 2019-08-21 ENCOUNTER — Other Ambulatory Visit: Payer: Self-pay

## 2019-08-21 ENCOUNTER — Emergency Department (HOSPITAL_COMMUNITY): Payer: Medicare Other

## 2019-08-21 ENCOUNTER — Inpatient Hospital Stay (HOSPITAL_COMMUNITY)
Admission: EM | Admit: 2019-08-21 | Discharge: 2019-08-31 | DRG: 037 | Disposition: A | Discharge status: Rehabilitation Facility | Payer: Medicare Other | Attending: Internal Medicine | Admitting: Internal Medicine

## 2019-08-21 DIAGNOSIS — I4891 Unspecified atrial fibrillation: Secondary | ICD-10-CM | POA: Diagnosis not present

## 2019-08-21 DIAGNOSIS — I708 Atherosclerosis of other arteries: Secondary | ICD-10-CM | POA: Diagnosis not present

## 2019-08-21 DIAGNOSIS — M797 Fibromyalgia: Secondary | ICD-10-CM | POA: Diagnosis present

## 2019-08-21 DIAGNOSIS — H9190 Unspecified hearing loss, unspecified ear: Secondary | ICD-10-CM | POA: Diagnosis not present

## 2019-08-21 DIAGNOSIS — I6521 Occlusion and stenosis of right carotid artery: Secondary | ICD-10-CM | POA: Diagnosis present

## 2019-08-21 DIAGNOSIS — E119 Type 2 diabetes mellitus without complications: Secondary | ICD-10-CM | POA: Diagnosis present

## 2019-08-21 DIAGNOSIS — I69354 Hemiplegia and hemiparesis following cerebral infarction affecting left non-dominant side: Secondary | ICD-10-CM | POA: Diagnosis not present

## 2019-08-21 DIAGNOSIS — F0390 Unspecified dementia without behavioral disturbance: Secondary | ICD-10-CM | POA: Diagnosis present

## 2019-08-21 DIAGNOSIS — Z7901 Long term (current) use of anticoagulants: Secondary | ICD-10-CM | POA: Diagnosis not present

## 2019-08-21 DIAGNOSIS — Z87440 Personal history of urinary (tract) infections: Secondary | ICD-10-CM

## 2019-08-21 DIAGNOSIS — I63511 Cerebral infarction due to unspecified occlusion or stenosis of right middle cerebral artery: Secondary | ICD-10-CM | POA: Diagnosis not present

## 2019-08-21 DIAGNOSIS — G8194 Hemiplegia, unspecified affecting left nondominant side: Secondary | ICD-10-CM | POA: Diagnosis not present

## 2019-08-21 DIAGNOSIS — Z7902 Long term (current) use of antithrombotics/antiplatelets: Secondary | ICD-10-CM

## 2019-08-21 DIAGNOSIS — R29818 Other symptoms and signs involving the nervous system: Secondary | ICD-10-CM | POA: Diagnosis not present

## 2019-08-21 DIAGNOSIS — R7309 Other abnormal glucose: Secondary | ICD-10-CM | POA: Diagnosis not present

## 2019-08-21 DIAGNOSIS — R471 Dysarthria and anarthria: Secondary | ICD-10-CM | POA: Diagnosis present

## 2019-08-21 DIAGNOSIS — R0902 Hypoxemia: Secondary | ICD-10-CM | POA: Diagnosis not present

## 2019-08-21 DIAGNOSIS — F05 Delirium due to known physiological condition: Secondary | ICD-10-CM | POA: Diagnosis present

## 2019-08-21 DIAGNOSIS — N3010 Interstitial cystitis (chronic) without hematuria: Secondary | ICD-10-CM | POA: Diagnosis present

## 2019-08-21 DIAGNOSIS — Z8673 Personal history of transient ischemic attack (TIA), and cerebral infarction without residual deficits: Secondary | ICD-10-CM

## 2019-08-21 DIAGNOSIS — R609 Edema, unspecified: Secondary | ICD-10-CM

## 2019-08-21 DIAGNOSIS — E038 Other specified hypothyroidism: Secondary | ICD-10-CM | POA: Diagnosis not present

## 2019-08-21 DIAGNOSIS — Z83438 Family history of other disorder of lipoprotein metabolism and other lipidemia: Secondary | ICD-10-CM

## 2019-08-21 DIAGNOSIS — F17220 Nicotine dependence, chewing tobacco, uncomplicated: Secondary | ICD-10-CM | POA: Diagnosis present

## 2019-08-21 DIAGNOSIS — Z823 Family history of stroke: Secondary | ICD-10-CM

## 2019-08-21 DIAGNOSIS — Z91013 Allergy to seafood: Secondary | ICD-10-CM

## 2019-08-21 DIAGNOSIS — G459 Transient cerebral ischemic attack, unspecified: Secondary | ICD-10-CM | POA: Diagnosis not present

## 2019-08-21 DIAGNOSIS — Z79890 Hormone replacement therapy (postmenopausal): Secondary | ICD-10-CM

## 2019-08-21 DIAGNOSIS — E118 Type 2 diabetes mellitus with unspecified complications: Secondary | ICD-10-CM | POA: Diagnosis not present

## 2019-08-21 DIAGNOSIS — Z8616 Personal history of COVID-19: Secondary | ICD-10-CM

## 2019-08-21 DIAGNOSIS — F341 Dysthymic disorder: Secondary | ICD-10-CM | POA: Diagnosis present

## 2019-08-21 DIAGNOSIS — I63231 Cerebral infarction due to unspecified occlusion or stenosis of right carotid arteries: Secondary | ICD-10-CM

## 2019-08-21 DIAGNOSIS — I6523 Occlusion and stenosis of bilateral carotid arteries: Secondary | ICD-10-CM | POA: Diagnosis present

## 2019-08-21 DIAGNOSIS — G9341 Metabolic encephalopathy: Secondary | ICD-10-CM | POA: Diagnosis not present

## 2019-08-21 DIAGNOSIS — R531 Weakness: Secondary | ICD-10-CM | POA: Diagnosis not present

## 2019-08-21 DIAGNOSIS — Z888 Allergy status to other drugs, medicaments and biological substances status: Secondary | ICD-10-CM

## 2019-08-21 DIAGNOSIS — Z90710 Acquired absence of both cervix and uterus: Secondary | ICD-10-CM | POA: Diagnosis not present

## 2019-08-21 DIAGNOSIS — E785 Hyperlipidemia, unspecified: Secondary | ICD-10-CM

## 2019-08-21 DIAGNOSIS — E876 Hypokalemia: Secondary | ICD-10-CM | POA: Diagnosis not present

## 2019-08-21 DIAGNOSIS — K219 Gastro-esophageal reflux disease without esophagitis: Secondary | ICD-10-CM | POA: Diagnosis present

## 2019-08-21 DIAGNOSIS — I10 Essential (primary) hypertension: Secondary | ICD-10-CM | POA: Diagnosis not present

## 2019-08-21 DIAGNOSIS — Z794 Long term (current) use of insulin: Secondary | ICD-10-CM

## 2019-08-21 DIAGNOSIS — I9581 Postprocedural hypotension: Secondary | ICD-10-CM | POA: Diagnosis not present

## 2019-08-21 DIAGNOSIS — I6381 Other cerebral infarction due to occlusion or stenosis of small artery: Secondary | ICD-10-CM | POA: Diagnosis present

## 2019-08-21 DIAGNOSIS — D6859 Other primary thrombophilia: Secondary | ICD-10-CM | POA: Diagnosis present

## 2019-08-21 DIAGNOSIS — I639 Cerebral infarction, unspecified: Secondary | ICD-10-CM | POA: Diagnosis not present

## 2019-08-21 DIAGNOSIS — Z79899 Other long term (current) drug therapy: Secondary | ICD-10-CM

## 2019-08-21 DIAGNOSIS — Z01810 Encounter for preprocedural cardiovascular examination: Secondary | ICD-10-CM | POA: Diagnosis not present

## 2019-08-21 DIAGNOSIS — F418 Other specified anxiety disorders: Secondary | ICD-10-CM | POA: Diagnosis present

## 2019-08-21 DIAGNOSIS — E538 Deficiency of other specified B group vitamins: Secondary | ICD-10-CM | POA: Diagnosis present

## 2019-08-21 DIAGNOSIS — I9589 Other hypotension: Secondary | ICD-10-CM | POA: Diagnosis not present

## 2019-08-21 DIAGNOSIS — I4821 Permanent atrial fibrillation: Secondary | ICD-10-CM | POA: Diagnosis not present

## 2019-08-21 DIAGNOSIS — N390 Urinary tract infection, site not specified: Secondary | ICD-10-CM | POA: Diagnosis not present

## 2019-08-21 DIAGNOSIS — I480 Paroxysmal atrial fibrillation: Secondary | ICD-10-CM | POA: Diagnosis not present

## 2019-08-21 DIAGNOSIS — I482 Chronic atrial fibrillation, unspecified: Secondary | ICD-10-CM | POA: Diagnosis not present

## 2019-08-21 DIAGNOSIS — I63239 Cerebral infarction due to unspecified occlusion or stenosis of unspecified carotid arteries: Secondary | ICD-10-CM | POA: Diagnosis not present

## 2019-08-21 DIAGNOSIS — R4701 Aphasia: Secondary | ICD-10-CM | POA: Diagnosis present

## 2019-08-21 DIAGNOSIS — K5900 Constipation, unspecified: Secondary | ICD-10-CM | POA: Diagnosis not present

## 2019-08-21 DIAGNOSIS — I771 Stricture of artery: Secondary | ICD-10-CM | POA: Diagnosis not present

## 2019-08-21 DIAGNOSIS — E1165 Type 2 diabetes mellitus with hyperglycemia: Secondary | ICD-10-CM | POA: Diagnosis not present

## 2019-08-21 DIAGNOSIS — R030 Elevated blood-pressure reading, without diagnosis of hypertension: Secondary | ICD-10-CM | POA: Diagnosis not present

## 2019-08-21 DIAGNOSIS — Z886 Allergy status to analgesic agent status: Secondary | ICD-10-CM

## 2019-08-21 DIAGNOSIS — Z20822 Contact with and (suspected) exposure to covid-19: Secondary | ICD-10-CM | POA: Diagnosis not present

## 2019-08-21 DIAGNOSIS — I951 Orthostatic hypotension: Secondary | ICD-10-CM | POA: Diagnosis not present

## 2019-08-21 DIAGNOSIS — Z9049 Acquired absence of other specified parts of digestive tract: Secondary | ICD-10-CM | POA: Diagnosis not present

## 2019-08-21 DIAGNOSIS — E559 Vitamin D deficiency, unspecified: Secondary | ICD-10-CM | POA: Diagnosis present

## 2019-08-21 DIAGNOSIS — U071 COVID-19: Secondary | ICD-10-CM | POA: Diagnosis not present

## 2019-08-21 DIAGNOSIS — R569 Unspecified convulsions: Secondary | ICD-10-CM | POA: Diagnosis not present

## 2019-08-21 DIAGNOSIS — Z8249 Family history of ischemic heart disease and other diseases of the circulatory system: Secondary | ICD-10-CM

## 2019-08-21 DIAGNOSIS — J9601 Acute respiratory failure with hypoxia: Secondary | ICD-10-CM | POA: Diagnosis not present

## 2019-08-21 DIAGNOSIS — Z91040 Latex allergy status: Secondary | ICD-10-CM

## 2019-08-21 DIAGNOSIS — E039 Hypothyroidism, unspecified: Secondary | ICD-10-CM | POA: Diagnosis not present

## 2019-08-21 DIAGNOSIS — I6623 Occlusion and stenosis of bilateral posterior cerebral arteries: Secondary | ICD-10-CM | POA: Diagnosis not present

## 2019-08-21 DIAGNOSIS — I6603 Occlusion and stenosis of bilateral middle cerebral arteries: Secondary | ICD-10-CM | POA: Diagnosis not present

## 2019-08-21 DIAGNOSIS — Z9889 Other specified postprocedural states: Secondary | ICD-10-CM | POA: Diagnosis not present

## 2019-08-21 DIAGNOSIS — E7800 Pure hypercholesterolemia, unspecified: Secondary | ICD-10-CM | POA: Diagnosis not present

## 2019-08-21 DIAGNOSIS — Z807 Family history of other malignant neoplasms of lymphoid, hematopoietic and related tissues: Secondary | ICD-10-CM

## 2019-08-21 DIAGNOSIS — I63233 Cerebral infarction due to unspecified occlusion or stenosis of bilateral carotid arteries: Secondary | ICD-10-CM | POA: Diagnosis not present

## 2019-08-21 DIAGNOSIS — I6389 Other cerebral infarction: Secondary | ICD-10-CM | POA: Diagnosis not present

## 2019-08-21 DIAGNOSIS — I6601 Occlusion and stenosis of right middle cerebral artery: Secondary | ICD-10-CM | POA: Diagnosis not present

## 2019-08-21 DIAGNOSIS — R410 Disorientation, unspecified: Secondary | ICD-10-CM | POA: Diagnosis not present

## 2019-08-21 DIAGNOSIS — Z882 Allergy status to sulfonamides status: Secondary | ICD-10-CM

## 2019-08-21 LAB — CBC WITH DIFFERENTIAL/PLATELET
Abs Immature Granulocytes: 0.02 10*3/uL (ref 0.00–0.07)
Basophils Absolute: 0 10*3/uL (ref 0.0–0.1)
Basophils Relative: 0 %
Eosinophils Absolute: 0.1 10*3/uL (ref 0.0–0.5)
Eosinophils Relative: 1 %
HCT: 34.3 % — ABNORMAL LOW (ref 36.0–46.0)
Hemoglobin: 11.4 g/dL — ABNORMAL LOW (ref 12.0–15.0)
Immature Granulocytes: 0 %
Lymphocytes Relative: 25 %
Lymphs Abs: 1.7 10*3/uL (ref 0.7–4.0)
MCH: 29.9 pg (ref 26.0–34.0)
MCHC: 33.2 g/dL (ref 30.0–36.0)
MCV: 90 fL (ref 80.0–100.0)
Monocytes Absolute: 0.7 10*3/uL (ref 0.1–1.0)
Monocytes Relative: 10 %
Neutro Abs: 4.3 10*3/uL (ref 1.7–7.7)
Neutrophils Relative %: 64 %
Platelets: 197 10*3/uL (ref 150–400)
RBC: 3.81 MIL/uL — ABNORMAL LOW (ref 3.87–5.11)
RDW: 14.6 % (ref 11.5–15.5)
WBC: 6.7 10*3/uL (ref 4.0–10.5)
nRBC: 0 % (ref 0.0–0.2)

## 2019-08-21 LAB — URINALYSIS, ROUTINE W REFLEX MICROSCOPIC
Bilirubin Urine: NEGATIVE
Glucose, UA: >=500 mg/dL — AB
Hgb urine dipstick: NEGATIVE
Ketones, ur: 20 mg/dL — AB
Leukocytes,Ua: NEGATIVE
Nitrite: NEGATIVE
Protein, ur: NEGATIVE mg/dL
Specific Gravity, Urine: 1.027 (ref 1.005–1.030)
pH: 5 (ref 5.0–8.0)

## 2019-08-21 LAB — COMPREHENSIVE METABOLIC PANEL
ALT: 18 U/L (ref 0–44)
AST: 20 U/L (ref 15–41)
Albumin: 3.2 g/dL — ABNORMAL LOW (ref 3.5–5.0)
Alkaline Phosphatase: 62 U/L (ref 38–126)
Anion gap: 10 (ref 5–15)
BUN: 9 mg/dL (ref 8–23)
CO2: 21 mmol/L — ABNORMAL LOW (ref 22–32)
Calcium: 8.4 mg/dL — ABNORMAL LOW (ref 8.9–10.3)
Chloride: 101 mmol/L (ref 98–111)
Creatinine, Ser: 0.75 mg/dL (ref 0.44–1.00)
GFR calc Af Amer: >60 mL/min (ref >60)
GFR calc non Af Amer: >60 mL/min (ref >60)
Glucose, Bld: 360 mg/dL — ABNORMAL HIGH (ref 70–99)
Potassium: 3.9 mmol/L (ref 3.5–5.1)
Sodium: 132 mmol/L — ABNORMAL LOW (ref 135–145)
Total Bilirubin: 1.1 mg/dL (ref 0.3–1.2)
Total Protein: 5.8 g/dL — ABNORMAL LOW (ref 6.5–8.1)

## 2019-08-21 LAB — CBG MONITORING, ED: Glucose-Capillary: 350 mg/dL — ABNORMAL HIGH (ref 70–99)

## 2019-08-21 MED ORDER — SODIUM CHLORIDE 0.9 % IV BOLUS
1000.0000 mL | Freq: Once | INTRAVENOUS | Status: AC
  Administered 2019-08-21: 1000 mL via INTRAVENOUS

## 2019-08-21 NOTE — ED Provider Notes (Signed)
[facility] EMERGENCY DEPARTMENT Provider Note   CSN: [id number] Arrival date & time: 08/21/19  1848     History Chief Complaint  Patient presents with  . Urinary Frequency  . Hyperglycemia    Jaclyn [name] is a 84 y.o. female with past medical history significant for A. fib, diverticulosis, fibromyalgia, interstitial cystitis, TIA, type 2 diabetes, hypertension.  Patient is on eliquis. Patient had covid 12/2018.  HPI Patient presents to emergency department today via EMS with chief complaint of urinary frequency and hyperglycemia x3 days.  Patient lives at home with her son.  He is at the bedside and provides history. He states x2 days ago patient was complaining of left arm pain and weakness.  She did not tell him until until 3 hours after the pain had already been going on.  She was also acting altered.  She is usually alert and oriented however was saying things that did not make sense.  Her speech however was clear.  Yesterday patient was complaining of continued left arm pain and weakness.  She was unable to grip things or use her left arm.  He thinks she also might of had a mechanical fall because when he came in the house from being in the yard he found her laying on the ground.  He states there is no loss of consciousness.  It appears patient had slid out of bed.  He is concerned she has a urinary tract infection as she typically becomes altered or has hallucinations with them.  Denies any fever, chills, chest pain, shortness of breath, abdominal pain, nausea, vomiting, back pain, dysuria, diarrhea.  Past Medical History:  Diagnosis Date  . Atrial fibrillation ([place])   . Benign neoplasm of colon   . Carotid artery occlusion    60-79% right ICA stenosis  . Carotid bruit   . Cerebrovascular disease, unspecified   . Difficult intubation 2008   During surgery to remove large polyp  . Diverticulosis of colon (without mention of hemorrhage)   . Dysthymic disorder     . Fibromyalgia   . Headache(784.0)   . Insomnia, unspecified   . Interstitial cystitis   . Myalgia and myositis, unspecified   . Osteoarthrosis, unspecified whether generalized or localized, unspecified site   . Other and unspecified hyperlipidemia   . Other chest pain   . Other specified benign mammary dysplasias   . TIA (transient ischemic attack)   . Type II or unspecified type diabetes mellitus without mention of complication, not stated as uncontrolled   . Unspecified essential hypertension   . Unspecified hypothyroidism   . Unspecified vitamin D deficiency   . Urinary tract infection, site not specified     Patient Active Problem List   Diagnosis Date Noted  . Vagina, candidiasis 03/10/2019  . Mild dementia ([place]) 03/10/2019  . Acute respiratory failure with hypoxia ([place]) 01/24/2019  . Acute metabolic encephalopathy 23/53/6144  . COVID-19 virus infection 01/15/2019  . Acute lower UTI 01/15/2019  . B12 deficiency 01/22/2018  . History of CVA (cerebrovascular accident) 06/20/2017  . Stenosis of right carotid artery   . Peripheral edema 07/29/2016  . Imbalance 04/07/2016  . Intertriginous candidiasis 04/26/2015  . Hard of hearing 04/12/2015  . TIA (transient ischemic attack) 04/10/2015  . History of recurrent UTIs 04/10/2015  . Dysuria 03/09/2015  . Tremor 05/11/2014  . Leg weakness, bilateral 05/11/2014  . Fatty liver 08/04/2013  . Personal history of colonic polyps 10/05/2012  . Esophageal reflux 10/05/2012  .  Carotid stenosis, symptomatic, with infarction ([place]) 10/14/2010  . ANXIETY DEPRESSION 01/13/2008  . Essential hypertension, benign 01/13/2008  . PAROXYSMAL ATRIAL FIBRILLATION 01/13/2008  . Intracranial vascular stenosis 01/13/2008  . DIVERTICULOSIS OF COLON 01/13/2008  . Fibromyalgia 01/13/2008  . CHEST PAIN, ATYPICAL 01/13/2008  . Hypothyroidism 04/13/2007  . Poorly controlled type 2 diabetes mellitus with complication ([place]) 09/47/0962  . Vitamin D  deficiency 04/13/2007  . Hyperlipidemia LDL goal <70 04/13/2007  . DEGENERATIVE JOINT DISEASE 04/13/2007    Past Surgical History:  Procedure Laterality Date  . ABDOMINAL HYSTERECTOMY  1988   cervical dysplasia  . CARDIAC CATHETERIZATION  02/19/06   EF 60%  . COLONOSCOPY    . RIGHT COLECTOMY  2005   for villous adenoma of the cecum Dr.streck  . VESICOVAGINAL FISTULA CLOSURE W/ TAH  1990   w/ cystocele &retocele repairs Dr. Ree Edman     OB History   No obstetric history on file.     Family History  Problem Relation Age of Onset  . Lymphoma Father   . Hypertension Father   . Stroke Father   . Hyperlipidemia Father   . Hypertension Mother   . Allergies Brother   . Hypertension Sister        3  . Breast cancer Sister   . Fibromyalgia Sister        3  . Hyperlipidemia Sister        3    Social History   Tobacco Use  . Smoking status: Never Smoker  . Smokeless tobacco: Never Used  Substance Use Topics  . Alcohol use: No    Alcohol/week: 0.0 standard drinks  . Drug use: No    Home Medications Prior to Admission medications   Medication Sig Start Date End Date Taking? Authorizing Provider  apixaban (ELIQUIS) 5 MG TABS tablet Take 1 tablet (5 mg total) by mouth 2 (two) times daily. 06/29/19  Yes Bedsole, Amy E, MD  Insulin Glargine (LANTUS SOLOSTAR) 100 UNIT/ML Solostar Pen Inject 65 Units into the skin daily. 03/15/19  Yes Bedsole, Amy E, MD  levothyroxine (SYNTHROID) 112 MCG tablet Take 1 tablet (112 mcg total) by mouth daily. 09/10/18  Yes Bedsole, Amy E, MD  sitaGLIPtin (JANUVIA) 100 MG tablet Take 1 tablet (100 mg total) by mouth daily. 02/10/19  Yes Bedsole, Amy E, MD  trimethoprim (TRIMPEX) 100 MG tablet Take 1 tablet (100 mg total) by mouth daily. 02/22/19  Yes Bedsole, Amy E, MD  B-D ULTRAFINE III SHORT PEN 31G X 8 MM MISC USE TO INJECT INSULIN ONCE DAILY. DX: E11.65 06/28/19   Bedsole, Amy E, MD  DULoxetine (CYMBALTA) 30 MG capsule Take 2 capsules (60 mg total) by  mouth daily. Patient not taking: Reported on 08/21/2019 06/29/19   Jinny Sanders, MD  vitamin B-12 (CYANOCOBALAMIN) 1000 MCG tablet Take 1 tablet (1,000 mcg total) by mouth daily. 09/10/18   Bedsole, Amy E, MD  Vitamin D, Ergocalciferol, (DRISDOL) 1.25 MG (50000 UT) CAPS capsule Take 1 capsule (50,000 Units total) by mouth every 7 (seven) days. 09/10/18   Jinny Sanders, MD    Allergies    Naproxen sodium, Statins, Sulfonamide derivatives, Latex, and Shellfish allergy  Review of Systems   Review of Systems  All other systems are reviewed and are negative for acute change except as noted in the HPI.   Physical Exam Updated Vital Signs BP 138/84   Pulse 79   Temp 98.4 F (36.9 C) (Rectal)   Resp (!) 23  Ht 5\' 2"  (1.575 m)   Wt 66 kg   BMI 26.61 kg/m   Physical Exam Vitals and nursing note reviewed.  Constitutional:      General: She is not in acute distress.    Appearance: She is not ill-appearing.  HENT:     Head: Normocephalic and atraumatic.     Right Ear: Tympanic membrane and external ear normal.     Left Ear: Tympanic membrane and external ear normal.     Nose: Nose normal.     Mouth/Throat:     Mouth: Mucous membranes are moist.     Pharynx: Oropharynx is clear.  Eyes:     General: No scleral icterus.       Right eye: No discharge.        Left eye: No discharge.     Extraocular Movements: Extraocular movements intact.     Conjunctiva/sclera: Conjunctivae normal.     Pupils: Pupils are equal, round, and reactive to light.  Neck:     Vascular: No JVD.  Cardiovascular:     Rate and Rhythm: Normal rate and regular rhythm.     Pulses: Normal pulses.          Radial pulses are 2+ on the right side and 2+ on the left side.     Heart sounds: Normal heart sounds.  Pulmonary:     Comments: Lungs clear to auscultation in all fields. Symmetric chest rise. No wheezing, rales, or rhonchi. Abdominal:     Comments: Abdomen is soft, non-distended, and non-tender in all  quadrants. No rigidity, no guarding. No peritoneal signs.  Musculoskeletal:        General: Normal range of motion.     Cervical back: Normal range of motion.  Skin:    General: Skin is warm and dry.     Capillary Refill: Capillary refill takes less than 2 seconds.  Neurological:     Mental Status: She is oriented to person, place, and time.     GCS: GCS eye subscore is 4. GCS verbal subscore is 5. GCS motor subscore is 6.     Comments: Fluent speech, no facial droop. Patient able to follow commands after significant prompting  Grip strength 4 out of 5 on the left compared to the right upper extremity.  Dysarthria with finger-to-nose. CN 2-12 intact.   Psychiatric:        Behavior: Behavior normal.     ED Results / Procedures / Treatments   Labs (all labs ordered are listed, but only abnormal results are displayed) Labs Reviewed  COMPREHENSIVE METABOLIC PANEL - Abnormal; Notable for the following components:      Result Value   Sodium 132 (*)    CO2 21 (*)    Glucose, Bld 360 (*)    Calcium 8.4 (*)    Total Protein 5.8 (*)    Albumin 3.2 (*)    All other components within normal limits  CBC WITH DIFFERENTIAL/PLATELET - Abnormal; Notable for the following components:   RBC 3.81 (*)    Hemoglobin 11.4 (*)    HCT 34.3 (*)    All other components within normal limits  URINALYSIS, ROUTINE W REFLEX MICROSCOPIC - Abnormal; Notable for the following components:   Glucose, UA >=500 (*)    Ketones, ur 20 (*)    Bacteria, UA RARE (*)    All other components within normal limits  CBG MONITORING, ED - Abnormal; Notable for the following components:   Glucose-Capillary 350 (*)  All other components within normal limits  URINE CULTURE  SARS CORONAVIRUS 2 BY RT PCR [facility] ORDER, [place] [facility])    EKG EKG Interpretation  Date/Time:  Sunday August 21 2019 18:54:20 EDT Ventricular Rate:  81 PR Interval:    QRS Duration: 90 QT Interval:  388 QTC  Calculation: 451 R Axis:   -83 Text Interpretation: Sinus rhythm Inferior infarct, old Consider anterior infarct similar to Nov 2020 Confirmed by Sherwood Gambler [phone]) on 08/21/2019 6:59:53 PM   Radiology CT Head Wo Contrast  Result Date: 08/21/2019 CLINICAL DATA:  Neuro deficits. Subacute arm weakness. Increasingly confused since Friday. EXAM: CT HEAD WITHOUT CONTRAST TECHNIQUE: Contiguous axial images were obtained from the base of the skull through the vertex without intravenous contrast. COMPARISON:  01/24/2019 FINDINGS: Brain: Moderate central and cortical atrophy. Periventricular white matter changes are consistent with small vessel disease. Vascular: Dense atherosclerotic calcification of the internal carotid arteries. No hyperdense vessels. Skull: Normal. Negative for fracture or focal lesion. Sinuses/Orbits: No acute finding. Other: None. IMPRESSION: 1. No evidence for acute intracranial abnormality. 2. Atrophy and small vessel disease. Electronically Signed   By: Nolon Nations M.D.   On: 08/21/2019 20:58   MR BRAIN WO CONTRAST  Result Date: 08/21/2019 CLINICAL DATA:  Initial evaluation for increased confusion, focal neural deficit. EXAM: MRI HEAD WITHOUT CONTRAST TECHNIQUE: Multiplanar, multiecho pulse sequences of the brain and surrounding structures were obtained without intravenous contrast. COMPARISON:  Prior head CT from earlier the same day. FINDINGS: Brain: Examination mildly degraded by motion artifact. Generalized age-related cerebral atrophy. Patchy and confluent T2/FLAIR hyperintensity within the periventricular and deep white matter of both cerebral hemispheres most consistent with chronic small vessel ischemic disease, moderate in nature. Patchy involvement of the pons noted. 6 mm acute ischemic infarcts seen involving the periventricular white matter of the posterior right frontal corona radiata/centrum semi ovale (series 5, image 83). No associated hemorrhage or mass effect. No  other evidence for acute or subacute ischemia. Gray-white matter differentiation otherwise maintained. No encephalomalacia to suggest chronic cortical infarction. No acute intracranial hemorrhage. Single chronic microhemorrhage noted at the inferior cerebellum, likely hypertensive in nature. No other evidence for chronic intracranial hemorrhage. No mass lesion, midline shift or mass effect. Diffuse ventricular prominence most likely related to global parenchymal volume loss without hydrocephalus. No extra-axial fluid collection. Pituitary gland suprasellar region within normal limits. Midline structures intact. Vascular: Major intracranial vascular flow voids are maintained. Skull and upper cervical spine: Craniocervical junction within normal limits. Bone marrow signal intensity normal. No scalp soft tissue abnormality. Sinuses/Orbits: Patient status post ocular lens replacement on the left. Globes and orbital soft tissues demonstrate no acute finding. Mild scattered mucosal thickening noted throughout the ethmoidal air cells and maxillary sinuses. Trace bilateral mastoid effusions noted, of doubtful significance. Visualized nasopharynx within normal limits. Other: None. IMPRESSION: 1. 6 mm acute ischemic nonhemorrhagic periventricular white matter infarct involving the posterior right frontal corona radiata/centrum semi ovale. 2. No other acute intracranial abnormality. 3. Age-related cerebral atrophy with moderate chronic microvascular ischemic disease. Electronically Signed   By: Jeannine Boga M.D.   On: 08/21/2019 23:44    Procedures .Critical Care Performed by: Cherre Robins, PA-C Authorized by: Cherre Robins, PA-C   Critical care provider statement:    Critical care time (minutes):  40   Critical care time was exclusive of:  Teaching time and separately billable procedures and treating other patients   Critical care was time spent personally by me  on the following activities:   Development of treatment plan with patient or surrogate, discussions with consultants, evaluation of patient's response to treatment, examination of patient, ordering and performing treatments and interventions, ordering and review of laboratory studies, ordering and review of radiographic studies, pulse oximetry, re-evaluation of patient's condition, review of old charts and obtaining history from patient or surrogate   I assumed direction of critical care for this patient from another provider in my specialty: no     (including critical care time)  Medications Ordered in ED Medications  sodium chloride 0.9 % bolus 1,000 mL (1,000 mLs Intravenous New Bag/Given 08/21/19 2203)    ED Course  I have reviewed the triage vital signs and the nursing notes.  Pertinent labs & imaging results that were available during my care of the patient were reviewed by me and considered in my medical decision making (see chart for details).  Clinical Course as of Aug 21 9  Sun Aug 21, 2019  [facility] neurology and case discussed with Dr. Malen Gauze.  He recommends proceeding with MR brain.   [KA]    Clinical Course User Index [KA] Levern Pitter, Harley Hallmark, PA-C   MDM Rules/Calculators/A&P                          History provided by patient and son with additional history obtained from chart review.    Patient seen and examined. Patient presents awake, alert, hemodynamically stable, afebrile, non toxic.  On exam patient is alert and oriented.  She does say things that do not make sense however her speech is clear.  She has 4/5 grip strength in the left upper extremity compared to the right.  She is able to follow commands after being prompted several times.  CBG on arrival is 350.  Labs show no leukocytosis, hemoglobin consistent with baseline.  No severe electrolyte derangement, no renal insufficiency.  Normal anion gap.  Not suggestive of DKA.  Patient's UA has no signs of infection but does have 20  ketones.  Patient given liter of IV fluids.  CT head is negative for stroke.  MR brain shows acute infarct. Discussed with Dr. Malen Gauze who medical medical admission for stroke work-up. Spoke with Dr. Sheppard Coil with hospitalist service who agrees to assume care of patient and bring into the hospital for further evaluation and management.  Appreciate admission.   ED attending Dr. Regenia Skeeter evaluated patient as well, this was a shared ED visit.  Portions of this note were generated with [employer]. Dictation errors may occur despite best attempts at proofreading.     Final Clinical Impression(s) / ED Diagnoses Final diagnoses:  Cerebrovascular accident (CVA), unspecified mechanism [facility])    Rx / DC Orders ED Discharge Orders    None       Cherre Robins, PA-C 08/22/19 0012    Sherwood Gambler, MD 08/22/19 [phone]

## 2019-08-21 NOTE — ED Triage Notes (Signed)
Pt coming from home after son was visiting and noticed his mother was becoming increasingly confused since Friday. Pt states that her urine has been having a smell, and states that she frequently gets UTI. Pt HOH. Pt's CBG 380 with EMS but she states her blood sugar is always over 300 "no matter what I eat". Received 500 cc of fluid. Vitals WDL, 99 temp with EMS

## 2019-08-21 NOTE — ED Notes (Signed)
Pt oriented x4 but seems spacey upon this RN entering the room. Pt talkative and did not look in the direction upon this RN speaking in pt's direction initially and then tapping pt on shoulder.

## 2019-08-22 ENCOUNTER — Encounter (HOSPITAL_COMMUNITY): Payer: Self-pay | Admitting: Osteopathic Medicine

## 2019-08-22 ENCOUNTER — Encounter (HOSPITAL_COMMUNITY): Payer: Medicare Other

## 2019-08-22 ENCOUNTER — Inpatient Hospital Stay (HOSPITAL_COMMUNITY): Payer: Medicare Other

## 2019-08-22 DIAGNOSIS — I63231 Cerebral infarction due to unspecified occlusion or stenosis of right carotid arteries: Secondary | ICD-10-CM

## 2019-08-22 DIAGNOSIS — Z7901 Long term (current) use of anticoagulants: Secondary | ICD-10-CM | POA: Diagnosis not present

## 2019-08-22 DIAGNOSIS — I482 Chronic atrial fibrillation, unspecified: Secondary | ICD-10-CM | POA: Diagnosis not present

## 2019-08-22 DIAGNOSIS — R030 Elevated blood-pressure reading, without diagnosis of hypertension: Secondary | ICD-10-CM | POA: Diagnosis not present

## 2019-08-22 DIAGNOSIS — I6521 Occlusion and stenosis of right carotid artery: Secondary | ICD-10-CM | POA: Diagnosis not present

## 2019-08-22 DIAGNOSIS — F05 Delirium due to known physiological condition: Secondary | ICD-10-CM | POA: Diagnosis present

## 2019-08-22 DIAGNOSIS — Z01810 Encounter for preprocedural cardiovascular examination: Secondary | ICD-10-CM | POA: Diagnosis not present

## 2019-08-22 DIAGNOSIS — M797 Fibromyalgia: Secondary | ICD-10-CM | POA: Diagnosis present

## 2019-08-22 DIAGNOSIS — E876 Hypokalemia: Secondary | ICD-10-CM | POA: Diagnosis not present

## 2019-08-22 DIAGNOSIS — E559 Vitamin D deficiency, unspecified: Secondary | ICD-10-CM | POA: Diagnosis present

## 2019-08-22 DIAGNOSIS — G9341 Metabolic encephalopathy: Secondary | ICD-10-CM

## 2019-08-22 DIAGNOSIS — E118 Type 2 diabetes mellitus with unspecified complications: Secondary | ICD-10-CM | POA: Diagnosis not present

## 2019-08-22 DIAGNOSIS — E119 Type 2 diabetes mellitus without complications: Secondary | ICD-10-CM | POA: Diagnosis not present

## 2019-08-22 DIAGNOSIS — I4821 Permanent atrial fibrillation: Secondary | ICD-10-CM | POA: Diagnosis present

## 2019-08-22 DIAGNOSIS — I6523 Occlusion and stenosis of bilateral carotid arteries: Secondary | ICD-10-CM | POA: Diagnosis present

## 2019-08-22 DIAGNOSIS — I63239 Cerebral infarction due to unspecified occlusion or stenosis of unspecified carotid arteries: Secondary | ICD-10-CM | POA: Diagnosis not present

## 2019-08-22 DIAGNOSIS — I6389 Other cerebral infarction: Secondary | ICD-10-CM | POA: Diagnosis not present

## 2019-08-22 DIAGNOSIS — K219 Gastro-esophageal reflux disease without esophagitis: Secondary | ICD-10-CM | POA: Diagnosis present

## 2019-08-22 DIAGNOSIS — E785 Hyperlipidemia, unspecified: Secondary | ICD-10-CM | POA: Diagnosis present

## 2019-08-22 DIAGNOSIS — R569 Unspecified convulsions: Secondary | ICD-10-CM | POA: Diagnosis not present

## 2019-08-22 DIAGNOSIS — I63511 Cerebral infarction due to unspecified occlusion or stenosis of right middle cerebral artery: Secondary | ICD-10-CM | POA: Diagnosis present

## 2019-08-22 DIAGNOSIS — N3010 Interstitial cystitis (chronic) without hematuria: Secondary | ICD-10-CM | POA: Diagnosis present

## 2019-08-22 DIAGNOSIS — N390 Urinary tract infection, site not specified: Secondary | ICD-10-CM | POA: Diagnosis not present

## 2019-08-22 DIAGNOSIS — I6381 Other cerebral infarction due to occlusion or stenosis of small artery: Secondary | ICD-10-CM | POA: Diagnosis present

## 2019-08-22 DIAGNOSIS — G8194 Hemiplegia, unspecified affecting left nondominant side: Secondary | ICD-10-CM | POA: Diagnosis present

## 2019-08-22 DIAGNOSIS — I10 Essential (primary) hypertension: Secondary | ICD-10-CM | POA: Diagnosis present

## 2019-08-22 DIAGNOSIS — F418 Other specified anxiety disorders: Secondary | ICD-10-CM | POA: Diagnosis present

## 2019-08-22 DIAGNOSIS — E038 Other specified hypothyroidism: Secondary | ICD-10-CM | POA: Diagnosis not present

## 2019-08-22 DIAGNOSIS — I951 Orthostatic hypotension: Secondary | ICD-10-CM | POA: Diagnosis not present

## 2019-08-22 DIAGNOSIS — H9190 Unspecified hearing loss, unspecified ear: Secondary | ICD-10-CM | POA: Diagnosis present

## 2019-08-22 DIAGNOSIS — R4701 Aphasia: Secondary | ICD-10-CM | POA: Diagnosis present

## 2019-08-22 DIAGNOSIS — E1165 Type 2 diabetes mellitus with hyperglycemia: Secondary | ICD-10-CM | POA: Diagnosis present

## 2019-08-22 DIAGNOSIS — R471 Dysarthria and anarthria: Secondary | ICD-10-CM | POA: Diagnosis present

## 2019-08-22 DIAGNOSIS — F341 Dysthymic disorder: Secondary | ICD-10-CM | POA: Diagnosis present

## 2019-08-22 DIAGNOSIS — Z8673 Personal history of transient ischemic attack (TIA), and cerebral infarction without residual deficits: Secondary | ICD-10-CM | POA: Diagnosis not present

## 2019-08-22 DIAGNOSIS — I639 Cerebral infarction, unspecified: Secondary | ICD-10-CM | POA: Diagnosis present

## 2019-08-22 DIAGNOSIS — R7309 Other abnormal glucose: Secondary | ICD-10-CM | POA: Diagnosis not present

## 2019-08-22 DIAGNOSIS — I69354 Hemiplegia and hemiparesis following cerebral infarction affecting left non-dominant side: Secondary | ICD-10-CM | POA: Diagnosis not present

## 2019-08-22 DIAGNOSIS — I480 Paroxysmal atrial fibrillation: Secondary | ICD-10-CM | POA: Diagnosis not present

## 2019-08-22 DIAGNOSIS — Z8616 Personal history of COVID-19: Secondary | ICD-10-CM | POA: Diagnosis not present

## 2019-08-22 DIAGNOSIS — E039 Hypothyroidism, unspecified: Secondary | ICD-10-CM | POA: Diagnosis present

## 2019-08-22 DIAGNOSIS — D6859 Other primary thrombophilia: Secondary | ICD-10-CM | POA: Diagnosis present

## 2019-08-22 DIAGNOSIS — G459 Transient cerebral ischemic attack, unspecified: Secondary | ICD-10-CM | POA: Diagnosis not present

## 2019-08-22 DIAGNOSIS — F17220 Nicotine dependence, chewing tobacco, uncomplicated: Secondary | ICD-10-CM | POA: Diagnosis present

## 2019-08-22 DIAGNOSIS — F0390 Unspecified dementia without behavioral disturbance: Secondary | ICD-10-CM | POA: Diagnosis not present

## 2019-08-22 LAB — GLUCOSE, CAPILLARY
Glucose-Capillary: 353 mg/dL — ABNORMAL HIGH (ref 70–99)
Glucose-Capillary: 136 mg/dL — ABNORMAL HIGH (ref 70–99)
Glucose-Capillary: 165 mg/dL — ABNORMAL HIGH (ref 70–99)
Glucose-Capillary: 106 mg/dL — ABNORMAL HIGH (ref 70–99)

## 2019-08-22 LAB — COMPREHENSIVE METABOLIC PANEL
ALT: 16 U/L (ref 0–44)
AST: 19 U/L (ref 15–41)
Albumin: 3.4 g/dL — ABNORMAL LOW (ref 3.5–5.0)
Alkaline Phosphatase: 60 U/L (ref 38–126)
Anion gap: 11 (ref 5–15)
BUN: 6 mg/dL — ABNORMAL LOW (ref 8–23)
CO2: 21 mmol/L — ABNORMAL LOW (ref 22–32)
Calcium: 8.7 mg/dL — ABNORMAL LOW (ref 8.9–10.3)
Chloride: 107 mmol/L (ref 98–111)
Creatinine, Ser: 0.65 mg/dL (ref 0.44–1.00)
GFR calc Af Amer: >60 mL/min (ref >60)
GFR calc non Af Amer: >60 mL/min (ref >60)
Glucose, Bld: 127 mg/dL — ABNORMAL HIGH (ref 70–99)
Potassium: 3.6 mmol/L (ref 3.5–5.1)
Sodium: 139 mmol/L (ref 135–145)
Total Bilirubin: 0.6 mg/dL (ref 0.3–1.2)
Total Protein: 6.4 g/dL — ABNORMAL LOW (ref 6.5–8.1)

## 2019-08-22 LAB — CBC WITH DIFFERENTIAL/PLATELET
Abs Immature Granulocytes: 0.02 10*3/uL (ref 0.00–0.07)
Basophils Absolute: 0 10*3/uL (ref 0.0–0.1)
Basophils Relative: 0 %
Eosinophils Absolute: 0.1 10*3/uL (ref 0.0–0.5)
Eosinophils Relative: 1 %
HCT: 36 % (ref 36.0–46.0)
Hemoglobin: 12.2 g/dL (ref 12.0–15.0)
Immature Granulocytes: 0 %
Lymphocytes Relative: 26 %
Lymphs Abs: 1.9 10*3/uL (ref 0.7–4.0)
MCH: 30.1 pg (ref 26.0–34.0)
MCHC: 33.9 g/dL (ref 30.0–36.0)
MCV: 88.9 fL (ref 80.0–100.0)
Monocytes Absolute: 0.7 10*3/uL (ref 0.1–1.0)
Monocytes Relative: 9 %
Neutro Abs: 4.6 10*3/uL (ref 1.7–7.7)
Neutrophils Relative %: 64 %
Platelets: 217 10*3/uL (ref 150–400)
RBC: 4.05 MIL/uL (ref 3.87–5.11)
RDW: 14.5 % (ref 11.5–15.5)
WBC: 7.2 10*3/uL (ref 4.0–10.5)
nRBC: 0 % (ref 0.0–0.2)

## 2019-08-22 LAB — LIPID PANEL
Cholesterol: 204 mg/dL — ABNORMAL HIGH (ref 0–200)
HDL: 33 mg/dL — ABNORMAL LOW (ref >40)
LDL Cholesterol: 103 mg/dL — ABNORMAL HIGH (ref 0–99)
Total CHOL/HDL Ratio: 6.2 RATIO
Triglycerides: 338 mg/dL — ABNORMAL HIGH (ref <150)
VLDL: 68 mg/dL — ABNORMAL HIGH (ref 0–40)

## 2019-08-22 LAB — AMMONIA: Ammonia: 26 umol/L (ref 9–35)

## 2019-08-22 LAB — SARS CORONAVIRUS 2 BY RT PCR (HOSPITAL ORDER, PERFORMED IN ~~LOC~~ HOSPITAL LAB): SARS Coronavirus 2: NEGATIVE

## 2019-08-22 LAB — ECHOCARDIOGRAM COMPLETE
Height: 62 in
Weight: 2328.06 oz

## 2019-08-22 LAB — VITAMIN B12: Vitamin B-12: 733 pg/mL (ref 180–914)

## 2019-08-22 LAB — HEMOGLOBIN A1C
Hgb A1c MFr Bld: 14.2 % — ABNORMAL HIGH (ref 4.8–5.6)
Mean Plasma Glucose: 360.84 mg/dL

## 2019-08-22 LAB — TSH: TSH: 2.876 u[IU]/mL (ref 0.350–4.500)

## 2019-08-22 MED ORDER — SENNOSIDES-DOCUSATE SODIUM 8.6-50 MG PO TABS: 2.0000 | ORAL_TABLET | Freq: Every evening | ORAL | Status: DC | PRN

## 2019-08-22 MED ORDER — APIXABAN 5 MG PO TABS
5.0000 mg | ORAL_TABLET | Freq: Two times a day (BID) | ORAL | Status: DC
  Administered 2019-08-22 – 2019-08-23 (×3): 5 mg via ORAL
  Filled 2019-08-22 (×4): qty 1

## 2019-08-22 MED ORDER — IOHEXOL 350 MG/ML SOLN
75.0000 mL | Freq: Once | INTRAVENOUS | Status: AC | PRN
  Administered 2019-08-22: 75 mL via INTRAVENOUS

## 2019-08-22 MED ORDER — ATORVASTATIN CALCIUM 10 MG PO TABS
10.0000 mg | ORAL_TABLET | Freq: Every day | ORAL | Status: DC
  Administered 2019-08-23: 10 mg via ORAL
  Filled 2019-08-22 (×2): qty 1

## 2019-08-22 MED ORDER — INSULIN ASPART 100 UNIT/ML ~~LOC~~ SOLN
0.0000 [IU] | Freq: Three times a day (TID) | SUBCUTANEOUS | Status: DC
  Administered 2019-08-22: 3 [IU] via SUBCUTANEOUS
  Administered 2019-08-22: 15 [IU] via SUBCUTANEOUS
  Administered 2019-08-22: 2 [IU] via SUBCUTANEOUS
  Administered 2019-08-23: 5 [IU] via SUBCUTANEOUS
  Administered 2019-08-23: 11 [IU] via SUBCUTANEOUS
  Administered 2019-08-23: 3 [IU] via SUBCUTANEOUS
  Administered 2019-08-24: 11 [IU] via SUBCUTANEOUS
  Administered 2019-08-24: 3 [IU] via SUBCUTANEOUS
  Administered 2019-08-25 (×3): 5 [IU] via SUBCUTANEOUS
  Administered 2019-08-26 – 2019-08-27 (×2): 2 [IU] via SUBCUTANEOUS
  Administered 2019-08-27 – 2019-08-28 (×5): 8 [IU] via SUBCUTANEOUS
  Administered 2019-08-29: 5 [IU] via SUBCUTANEOUS
  Administered 2019-08-29: 2 [IU] via SUBCUTANEOUS
  Administered 2019-08-30: 5 [IU] via SUBCUTANEOUS
  Administered 2019-08-31: 15 [IU] via SUBCUTANEOUS
  Administered 2019-08-31: 2 [IU] via SUBCUTANEOUS

## 2019-08-22 MED ORDER — VITAMIN B-12 1000 MCG PO TABS
1000.0000 ug | ORAL_TABLET | Freq: Every day | ORAL | Status: DC
  Administered 2019-08-22 – 2019-08-31 (×9): 1000 ug via ORAL
  Filled 2019-08-22 (×9): qty 1

## 2019-08-22 MED ORDER — LEVOTHYROXINE SODIUM 112 MCG PO TABS
112.0000 ug | ORAL_TABLET | Freq: Every day | ORAL | Status: DC
  Administered 2019-08-22 – 2019-08-31 (×10): 112 ug via ORAL
  Filled 2019-08-22 (×10): qty 1

## 2019-08-22 MED ORDER — STROKE: EARLY STAGES OF RECOVERY BOOK
Freq: Once | Status: AC
  Filled 2019-08-22 (×2): qty 1

## 2019-08-22 MED ORDER — LINAGLIPTIN 5 MG PO TABS
5.0000 mg | ORAL_TABLET | Freq: Every day | ORAL | Status: DC
  Administered 2019-08-22 – 2019-08-31 (×9): 5 mg via ORAL
  Filled 2019-08-22 (×10): qty 1

## 2019-08-22 MED ORDER — TRIMETHOPRIM 100 MG PO TABS
100.0000 mg | ORAL_TABLET | Freq: Every day | ORAL | Status: DC
  Administered 2019-08-22 – 2019-08-31 (×9): 100 mg via ORAL
  Filled 2019-08-22 (×10): qty 1

## 2019-08-22 MED ORDER — ACETAMINOPHEN 160 MG/5ML PO SOLN: 650.0000 mg | ORAL | Status: DC | PRN

## 2019-08-22 MED ORDER — ACETAMINOPHEN 650 MG RE SUPP: 650.0000 mg | RECTAL | Status: DC | PRN

## 2019-08-22 MED ORDER — INSULIN GLARGINE 100 UNIT/ML ~~LOC~~ SOLN
65.0000 [IU] | Freq: Every day | SUBCUTANEOUS | Status: DC
  Administered 2019-08-22 – 2019-08-24 (×3): 65 [IU] via SUBCUTANEOUS
  Filled 2019-08-22 (×3): qty 0.65

## 2019-08-22 MED ORDER — ACETAMINOPHEN 325 MG PO TABS: 650.0000 mg | ORAL_TABLET | ORAL | Status: DC | PRN

## 2019-08-22 MED ORDER — INSULIN ASPART 100 UNIT/ML ~~LOC~~ SOLN
0.0000 [IU] | Freq: Every day | SUBCUTANEOUS | Status: DC
  Administered 2019-08-23 – 2019-08-29 (×3): 2 [IU] via SUBCUTANEOUS

## 2019-08-22 NOTE — Consult Note (Addendum)
Neurology Consultation  Reason for Consult: Left-sided weakness, altered mental status, urinary frequency, hyperglycemia Referring Physician: Dr. Elnora Morrison, EDP  CC: Left-sided weakness, altered mental status  History is obtained from: Patient, son  HPI: Jaclyn [name] is a 84 y.o. female past medical history of atrial fibrillation on Eliquis, diabetes, bilateral ICA stenosis, hyperlipidemia, fibromyalgia, headaches, multiple TIAs with left-sided symptoms with no residual deficits, last normal somewhere around 5 PM when she was noticed to have left-sided weakness by family. The son reports that she was found down on the floor that she had left her inside to go more the yard and when he came back she was down on the ground.  She denied any head trauma or hitting the head on the floor.  Her speech sounded a little slurred and she sounded confused.  She gets frequent UTIs and the son thought that she probably is having another UTI and brought her in for further evaluation.  He also at the time of bringing her noted that her left arm was weaker and floppier compared to the right side-but noted that that had been going on for maybe even a day or so. She was evaluated in the emergency room, found to be hyperglycemic-initial fingerstick 350 He reports that she has had episodes of hallucinations as well as her short-term memory has been going down pretty rapidly over the past few months. Denies fever chills.  Denies shortness of breath cough.  Denies nausea vomiting.  Denies palpitations.  Reports increasing urinary frequency.   LKW: Unclear tpa given?: no, no last known well Premorbid modified Rankin scale (mRS):3   ROS: Performed and negative except noted in HPI.  Past Medical History:  Diagnosis Date  . Atrial fibrillation ([place])   . Benign neoplasm of colon   . Carotid artery occlusion    60-79% right ICA stenosis  . Carotid bruit   . Cerebrovascular disease, unspecified   . Difficult  intubation 2008   During surgery to remove large polyp  . Diverticulosis of colon (without mention of hemorrhage)   . Dysthymic disorder   . Fibromyalgia   . Headache(784.0)   . Insomnia, unspecified   . Interstitial cystitis   . Myalgia and myositis, unspecified   . Osteoarthrosis, unspecified whether generalized or localized, unspecified site   . Other and unspecified hyperlipidemia   . Other chest pain   . Other specified benign mammary dysplasias   . TIA (transient ischemic attack)   . Type II or unspecified type diabetes mellitus without mention of complication, not stated as uncontrolled   . Unspecified essential hypertension   . Unspecified hypothyroidism   . Unspecified vitamin D deficiency   . Urinary tract infection, site not specified     Family History  Problem Relation Age of Onset  . Lymphoma Father   . Hypertension Father   . Stroke Father   . Hyperlipidemia Father   . Hypertension Mother   . Allergies Brother   . Hypertension Sister        3  . Breast cancer Sister   . Fibromyalgia Sister        3  . Hyperlipidemia Sister        3   Social History:   reports that she has never smoked. She has never used smokeless tobacco. She reports that she does not drink alcohol and does not use drugs.  Medications No current facility-administered medications for this encounter.  Current Outpatient Medications:  .  apixaban (ELIQUIS) 5  MG TABS tablet, Take 1 tablet (5 mg total) by mouth 2 (two) times daily., Disp: 180 tablet, Rfl: 1 .  Insulin Glargine (LANTUS SOLOSTAR) 100 UNIT/ML Solostar Pen, Inject 65 Units into the skin daily., Disp: 15 mL, Rfl: 11 .  levothyroxine (SYNTHROID) 112 MCG tablet, Take 1 tablet (112 mcg total) by mouth daily., Disp: 90 tablet, Rfl: 3 .  sitaGLIPtin (JANUVIA) 100 MG tablet, Take 1 tablet (100 mg total) by mouth daily., Disp: 30 tablet, Rfl: 11 .  trimethoprim (TRIMPEX) 100 MG tablet, Take 1 tablet (100 mg total) by mouth daily., Disp:  90 tablet, Rfl: 3 .  B-D ULTRAFINE III SHORT PEN 31G X 8 MM MISC, USE TO INJECT INSULIN ONCE DAILY. DX: E11.65, Disp: 100 each, Rfl: 3 .  DULoxetine (CYMBALTA) 30 MG capsule, Take 2 capsules (60 mg total) by mouth daily. (Patient not taking: Reported on 08/21/2019), Disp: 180 capsule, Rfl: 1 .  vitamin B-12 (CYANOCOBALAMIN) 1000 MCG tablet, Take 1 tablet (1,000 mcg total) by mouth daily., Disp: 30 tablet, Rfl: 11 .  Vitamin D, Ergocalciferol, (DRISDOL) 1.25 MG (50000 UT) CAPS capsule, Take 1 capsule (50,000 Units total) by mouth every 7 (seven) days., Disp: 12 capsule, Rfl: 0  Exam: Current vital signs: BP 132/72   Pulse 77   Temp 98.4 F (36.9 C) (Rectal)   Resp 16   Ht 5\' 2"  (1.575 m)   Wt 66 kg   BMI 26.61 kg/m  Vital signs in last 24 hours: Temp:  [98.4 F (36.9 C)-98.7 F (37.1 C)] 98.4 F (36.9 C) (07/04 1927) Pulse Rate:  [68-79] 77 (07/04 2156) Resp:  [14-23] 16 (07/04 2156) BP: (132-138)/(67-84) 132/72 (07/04 2015) Weight:  [66 kg] 66 kg (07/04 1857) General: Awake alert, in no distress. HEENT: Normocephalic atraumatic, positive right carotid bruit, extremely poor dentition.  Extremely hard of hearing CVS: Regular rate rhythm Respiratory: Clear to auscultation Neurological exam Awake alert oriented x2. Reduced attention concentration Extremely hard of hearing hence conversation very difficult with the masks on. Naming intact.  Repetition intact.  Follows commands. Speech is mildly dysarthric. Cranial nerves: Pupils equal round react light, extraocular movements intact, visual fields full, no facial asymmetry, facial sensation intact, auditory acuity moderately reduced bilaterally, palate with symmetric elevation, intact shoulder shrug.  Tongue midline. Motor exam: Left upper extremity is 4 -/5 without drift.  All other extremities are 5/5 without drift. Sensory exam: No deficits noted Gait testing deferred Coordination: Difficult to assess due to her poor attention  concentration NIH stroke scale 1a Level of Conscious.: 0 1b LOC Questions: 1 1c LOC Commands: 0 2 Best Gaze: 0 3 Visual: 0 4 Facial Palsy: 0 5a Motor Arm - left: 1 5b Motor Arm - Right: 0 6a Motor Leg - Left: 0 6b Motor Leg - Right: 0 7 Limb Ataxia: 0 8 Sensory: 0 9 Best Language: 0 10 Dysarthria: 1 11 Extinct. and Inatten.: 0 TOTAL: 3   Labs I have reviewed labs in epic and the results pertinent to this consultation are:  CBC    Component Value Date/Time   WBC 6.7 08/21/2019 1939   RBC 3.81 (L) 08/21/2019 1939   HGB 11.4 (L) 08/21/2019 1939   HCT 34.3 (L) 08/21/2019 1939   PLT 197 08/21/2019 1939   MCV 90.0 08/21/2019 1939   MCH 29.9 08/21/2019 1939   MCHC 33.2 08/21/2019 1939   RDW 14.6 08/21/2019 1939   LYMPHSABS 1.7 08/21/2019 1939   MONOABS 0.7 08/21/2019 1939   EOSABS 0.1  08/21/2019 1939   BASOSABS 0.0 08/21/2019 1939    CMP     Component Value Date/Time   NA 132 (L) 08/21/2019 1939   K 3.9 08/21/2019 1939   CL 101 08/21/2019 1939   CO2 21 (L) 08/21/2019 1939   GLUCOSE 360 (H) 08/21/2019 1939   BUN 9 08/21/2019 1939   CREATININE 0.75 08/21/2019 1939   CREATININE 0.81 02/10/2019 1240   CALCIUM 8.4 (L) 08/21/2019 1939   PROT 5.8 (L) 08/21/2019 1939   ALBUMIN 3.2 (L) 08/21/2019 1939   AST 20 08/21/2019 1939   ALT 18 08/21/2019 1939   ALKPHOS 62 08/21/2019 1939   BILITOT 1.1 08/21/2019 1939   GFRNONAA >60 08/21/2019 1939   GFRAA >60 08/21/2019 1939    Lipid Panel     Component Value Date/Time   CHOL 239 (H) 09/06/2018 1548   TRIG (H) 09/06/2018 1548    438.0 Triglyceride is over 400; calculations on Lipids are invalid.   HDL 39.20 09/06/2018 1548   CHOLHDL 6 09/06/2018 1548   VLDL 47 (H) 06/20/2017 0317   LDLCALC 79 06/20/2017 0317   LDLDIRECT 146.0 09/06/2018 1548     Imaging I have reviewed the images obtained: He had no acute changes. MRI of the brain showed a 6 mm acute ischemic nonhemorrhagic periventricular white matter infarct  involving the posterior right frontal corona radiata/centrum semiovale.  Age-related cerebral atrophy with moderate chronic microvascular disease.  Assessment:  84 year old with history of A. fib on Eliquis, bilateral carotid stenosis, and other history as documented in the HPI presenting for evaluation of urinary frequency, found to have hyperglycemia but also found to have left arm weakness has been going on for a couple of days. MRI brain reveals a 6 mm nonhemorrhagic ischemic acute infarct in the right periventricular white matter in the posterior frontal lobe. Likely secondary to uncontrolled risk factors and atrial fibrillation.  Of note, she has had worsening short-term mentation as well as has hallucinations on times according to the son-we will need outpatient neuropsych evaluation.  Recommendations: Admit to hospitalist Frequent neurochecks Telemetry Permissive hypertension for another 24 hours-treat only if systolic blood pressures are greater than 220. Currently on Eliquis-I would continue Eliquis. Check lipid panel -goal LDL less than 70. Check hemoglobin A1c CT angiogram head and neck PT OT speech therapy N.p.o. until cleared by speech Check TSH, B12, RPR.  Discussed with Cherre Robins, PA-C from the ED.  Stroke team will follow with you.   -- Amie Portland, MD [facility] Pager: [phone] If 7pm to 7am, please call on call as listed on AMION.

## 2019-08-22 NOTE — Progress Notes (Signed)
Inpatient Rehab Admissions Coordinator Note:   Per therapy recommendations, pt was screened for CIR candidacy by Shann Medal, PT, DPT.  At this time we are recommending a CIR consult.  I will place an order per our protocol.  Please contact me with questions.   Shann Medal, PT, DPT [phone] 08/22/19 2:44 PM

## 2019-08-22 NOTE — Progress Notes (Signed)
STROKE TEAM PROGRESS NOTE   INTERVAL HISTORY Her son is at the bedside.  Patient is lying in bed comfortably.  She has right gaze preference.  She has diminished attention to the left with some neglect.  Vital signs stable.  MRI scan showed only tiny right frontal periventricular white matter infarct.  Echocardiogram is normal.  LDL cholesterol is 103 mg percent.  Triglycerides 338 mg percent.  Hemoglobin A1c is elevated at 14.2. Vitals:   08/22/19 0230 08/22/19 0359 08/22/19 0626 08/22/19 0800  BP: 109/69 (!) 145/50 127/66 129/76  Pulse: 81 83 79 78  Resp: 16 20 18 20   Temp:   98.1 F (36.7 C) 97.9 F (36.6 C)  TempSrc:   Oral Oral  SpO2: 98% 98% 96% 96%  Weight:      Height:        CBC:  Recent Labs  Lab 08/21/19 1939 08/22/19 1137  WBC 6.7 7.2  NEUTROABS 4.3 4.6  HGB 11.4* 12.2  HCT 34.3* 36.0  MCV 90.0 88.9  PLT 197 161    Basic Metabolic Panel:  Recent Labs  Lab 08/21/19 1939 08/22/19 1137  NA 132* 139  K 3.9 3.6  CL 101 107  CO2 21* 21*  GLUCOSE 360* 127*  BUN 9 6*  CREATININE 0.75 0.65  CALCIUM 8.4* 8.7*   Lipid Panel:     Component Value Date/Time   CHOL 204 (H) 08/22/2019 0459   TRIG 338 (H) 08/22/2019 0459   HDL 33 (L) 08/22/2019 0459   CHOLHDL 6.2 08/22/2019 0459   VLDL 68 (H) 08/22/2019 0459   LDLCALC 103 (H) 08/22/2019 0459   HgbA1c:  Lab Results  Component Value Date   HGBA1C 14.2 (H) 08/22/2019   Urine Drug Screen:     Component Value Date/Time   LABOPIA NONE DETECTED 06/20/2017 0019   COCAINSCRNUR NONE DETECTED 06/20/2017 0019   LABBENZ NONE DETECTED 06/20/2017 0019   AMPHETMU NONE DETECTED 06/20/2017 0019   THCU NONE DETECTED 06/20/2017 0019   LABBARB NONE DETECTED 06/20/2017 0019    Alcohol Level     Component Value Date/Time   ETH <10 06/19/2017 2247    IMAGING past 24 hours CT Head Wo Contrast  Result Date: 08/21/2019 CLINICAL DATA:  Neuro deficits. Subacute arm weakness. Increasingly confused since Friday. EXAM: CT  HEAD WITHOUT CONTRAST TECHNIQUE: Contiguous axial images were obtained from the base of the skull through the vertex without intravenous contrast. COMPARISON:  01/24/2019 FINDINGS: Brain: Moderate central and cortical atrophy. Periventricular white matter changes are consistent with small vessel disease. Vascular: Dense atherosclerotic calcification of the internal carotid arteries. No hyperdense vessels. Skull: Normal. Negative for fracture or focal lesion. Sinuses/Orbits: No acute finding. Other: None. IMPRESSION: 1. No evidence for acute intracranial abnormality. 2. Atrophy and small vessel disease. Electronically Signed   By: Nolon Nations M.D.   On: 08/21/2019 20:58   MR BRAIN WO CONTRAST  Result Date: 08/21/2019 CLINICAL DATA:  Initial evaluation for increased confusion, focal neural deficit. EXAM: MRI HEAD WITHOUT CONTRAST TECHNIQUE: Multiplanar, multiecho pulse sequences of the brain and surrounding structures were obtained without intravenous contrast. COMPARISON:  Prior head CT from earlier the same day. FINDINGS: Brain: Examination mildly degraded by motion artifact. Generalized age-related cerebral atrophy. Patchy and confluent T2/FLAIR hyperintensity within the periventricular and deep white matter of both cerebral hemispheres most consistent with chronic small vessel ischemic disease, moderate in nature. Patchy involvement of the pons noted. 6 mm acute ischemic infarcts seen involving the periventricular white matter  of the posterior right frontal corona radiata/centrum semi ovale (series 5, image 83). No associated hemorrhage or mass effect. No other evidence for acute or subacute ischemia. Gray-white matter differentiation otherwise maintained. No encephalomalacia to suggest chronic cortical infarction. No acute intracranial hemorrhage. Single chronic microhemorrhage noted at the inferior cerebellum, likely hypertensive in nature. No other evidence for chronic intracranial hemorrhage. No mass  lesion, midline shift or mass effect. Diffuse ventricular prominence most likely related to global parenchymal volume loss without hydrocephalus. No extra-axial fluid collection. Pituitary gland suprasellar region within normal limits. Midline structures intact. Vascular: Major intracranial vascular flow voids are maintained. Skull and upper cervical spine: Craniocervical junction within normal limits. Bone marrow signal intensity normal. No scalp soft tissue abnormality. Sinuses/Orbits: Patient status post ocular lens replacement on the left. Globes and orbital soft tissues demonstrate no acute finding. Mild scattered mucosal thickening noted throughout the ethmoidal air cells and maxillary sinuses. Trace bilateral mastoid effusions noted, of doubtful significance. Visualized nasopharynx within normal limits. Other: None. IMPRESSION: 1. 6 mm acute ischemic nonhemorrhagic periventricular white matter infarct involving the posterior right frontal corona radiata/centrum semi ovale. 2. No other acute intracranial abnormality. 3. Age-related cerebral atrophy with moderate chronic microvascular ischemic disease. Electronically Signed   By: Jeannine Boga M.D.   On: 08/21/2019 23:44   ECHOCARDIOGRAM COMPLETE  Result Date: 08/22/2019    ECHOCARDIOGRAM REPORT   Patient Name:   Jaclyn [name] Date of Exam: 08/22/2019 Medical Rec #:  [id number]       Height:       62.0 in Accession #:    [id number]      Weight:       145.5 lb Date of Birth:  [birth year]       BSA:          1.670 m Patient Age:    84 years        BP:           127/66 mmHg Patient Gender: F               HR:           70 bpm. Exam Location:  Inpatient Procedure: 2D Echo, Color Doppler and Cardiac Doppler Indications:    Stroke i163.9  History:        Patient has prior history of Echocardiogram examinations, most                 recent 06/21/2017. Arrythmias:Atrial Fibrillation; Risk                 Factors:Hypertension, Diabetes and Dyslipidemia. COVID+ on                  01/15/19.  Sonographer:    [name] Senior RDCS Referring Phys: [id number] [place]  1. Left ventricular ejection fraction, by estimation, is 65 to 70%. The left ventricle has normal function. The left ventricle has no regional wall motion abnormalities. There is moderate left ventricular hypertrophy. Left ventricular diastolic parameters are indeterminate.  2. Right ventricular systolic function is normal. The right ventricular size is normal. Mildly increased right ventricular wall thickness. Tricuspid regurgitation signal is inadequate for assessing PA pressure.  3. The mitral valve is grossly normal, mild annular calcification. Trivial mitral valve regurgitation.  4. The aortic valve is tricuspid, mild annular calcification. Aortic valve regurgitation is not visualized.  5. The inferior vena cava is normal in size with greater than 50% respiratory variability, suggesting right atrial pressure of 3 mmHg.  FINDINGS  Left Ventricle: Left ventricular ejection fraction, by estimation, is 65 to 70%. The left ventricle has normal function. The left ventricle has no regional wall motion abnormalities. The left ventricular internal cavity size was small. There is moderate  left ventricular hypertrophy. Left ventricular diastolic parameters are indeterminate. Right Ventricle: The right ventricular size is normal. Mildly increased right ventricular wall thickness. Right ventricular systolic function is normal. Tricuspid regurgitation signal is inadequate for assessing PA pressure. Left Atrium: Left atrial size was normal in size. Right Atrium: Right atrial size was normal in size. Pericardium: Trivial pericardial effusion is present. The pericardial effusion is posterior to the left ventricle. Presence of pericardial fat pad. Mitral Valve: The mitral valve is grossly normal. Mild mitral annular calcification. Trivial mitral valve regurgitation. Tricuspid Valve: The tricuspid valve is grossly  normal. Tricuspid valve regurgitation is trivial. Aortic Valve: The aortic valve is tricuspid. Aortic valve regurgitation is not visualized. Mild aortic valve annular calcification. Pulmonic Valve: The pulmonic valve was grossly normal. Pulmonic valve regurgitation is trivial. Aorta: The aortic root is normal in size and structure. Venous: The inferior vena cava is normal in size with greater than 50% respiratory variability, suggesting right atrial pressure of 3 mmHg. IAS/Shunts: No atrial level shunt detected by color flow Doppler.  LEFT VENTRICLE PLAX 2D LVIDd:         2.60 cm  Diastology LVIDs:         1.60 cm  LV e' lateral:   4.90 cm/s LV PW:         1.50 cm  LV E/e' lateral: 15.6 LV IVS:        1.30 cm  LV e' medial:    5.66 cm/s LVOT diam:     1.60 cm  LV E/e' medial:  13.5 LV SV:         36 LV SV Index:   22 LVOT Area:     2.01 cm  RIGHT VENTRICLE RV S prime:     12.00 cm/s TAPSE (M-mode): 1.7 cm LEFT ATRIUM             Index       RIGHT ATRIUM           Index LA diam:        1.70 cm 1.02 cm/m  RA Area:     10.30 cm LA Vol (A2C):   28.0 ml 16.77 ml/m RA Volume:   22.40 ml  13.41 ml/m LA Vol (A4C):   27.4 ml 16.41 ml/m LA Biplane Vol: 28.5 ml 17.07 ml/m  AORTIC VALVE LVOT Vmax:   76.30 cm/s LVOT Vmean:  54.100 cm/s LVOT VTI:    0.179 m  AORTA Ao Root diam: 3.10 cm Ao Asc diam:  3.00 cm MITRAL VALVE MV Area (PHT): 1.91 cm    SHUNTS MV Decel Time: 398 msec    Systemic VTI:  0.18 m MV E velocity: 76.40 cm/s  Systemic Diam: 1.60 cm MV A velocity: 90.00 cm/s MV E/A ratio:  0.85 Rozann Lesches MD Electronically signed by Rozann Lesches MD Signature Date/Time: 08/22/2019/11:43:40 AM    Final    CT ANGIO HEAD CODE STROKE  Result Date: 08/22/2019 CLINICAL DATA:  84 year old female with a small acute right superior frontal white matter lacunar infarct after presenting with arm weakness and confusion. EXAM: CT ANGIOGRAPHY HEAD AND NECK TECHNIQUE: Multidetector CT imaging of the head and neck was performed  using the standard protocol during bolus administration of intravenous contrast. Multiplanar CT image  reconstructions and MIPs were obtained to evaluate the vascular anatomy. Carotid stenosis measurements (when applicable) are obtained utilizing NASCET criteria, using the distal internal carotid diameter as the denominator. CONTRAST:  36mL OMNIPAQUE IOHEXOL 350 MG/ML SOLN COMPARISON:  Brain MRI and plain head CT 08/21/2019. CTA head and neck and CTP 06/20/2017. FINDINGS: CTA NECK Skeleton: Carious dentition. Chronic cervical spine degeneration. No acute osseous abnormality identified. Upper chest: Chronic apical lung scarring is stable. No superior mediastinal lymphadenopathy. Unchanged borderline to mild circumferential wall thickening of the upper thoracic esophagus. Other neck: Negative. Aortic arch: 3 vessel arch configuration. Stable mild distal arch calcified plaque. Right carotid system: Negative brachiocephalic artery and proximal right CCA. Mild plaque proximal to the bifurcation without stenosis. Bulky chronic mostly calcified plaque at the right ICA origin and bulb resulting in radiographic string sign stenosis (series 5, image 94) over the initial 10 mm segment of the vessel. This appears mildly progressed. The right ICA remains patent and is otherwise negative to the skull base. Left carotid system: Minimal plaque at the left CCA origin without stenosis. Mild plaque proximal to the bifurcation without stenosis. Chronic bulky calcified plaque at the left ICA origin resulting in stenosis numerically estimated at 70% with respect to the distal vessel (series 9, image 126). This is stable. Left ICA is tortuous and remains patent distal to the stenosis. Vertebral arteries: Calcified proximal right subclavian artery plaque without stenosis. Dominant right vertebral artery is mildly ectatic without plaque or stenosis to the skull base. Moderate calcified plaque in the proximal left subclavian artery with up to  50% stenosis appears stable. Mild calcified plaque at the non dominant left vertebral artery origin not resulting in stenosis. The left vertebral remains non dominant to the skull base without stenosis. CTA HEAD Posterior circulation: Dominant right vertebral artery primarily supplies the basilar with minimal calcified plaque and no stenosis. Normal right PICA origin. Left V4 segment is non dominant with new severe stenosis or short segment occlusion just proximal to the vertebrobasilar junction on series 11, image 20. This is about 3 mm in length. The basilar artery is patent but newly diminutive since 2019 with multifocal irregularity. Only mild focal basilar artery stenosis results. The SCA and PCA origins remain patent. Chronic bilateral PCA atherosclerosis has progressed now with severe bilateral P1 and P2 segment stenoses (series 11, images 17 and 18). Despite this there is distal bilateral PCA enhancement. Anterior circulation: Both ICA siphons are patent but heavily calcified. On the left there is severe supraclinoid stenosis on series 8, image 63, mildly progressed since 2019. On the right there is moderate to severe cavernous and proximal supraclinoid stenosis due to bulky calcified plaque which appears stable. Carotid termini remain patent. Mild-to-moderate bilateral chronic proximal MCA stenosis has not significantly changed. A1 segments remain normal. Mild bilateral ACA branch irregularity is stable. Distal right M1 stenosis is moderate and increased (series 10, image 16). Left MCA bifurcation remains patent, with stable left MCA branches demonstrating multifocal mild irregularity. Right MCA bifurcation remains patent with stable right MCA branches demonstrating multifocal mild irregularity. Venous sinuses: Patent. Anatomic variants: Dominant right vertebral artery. Review of the MIP images confirms the above findings IMPRESSION: 1. Negative for large vessel occlusion. 2. But positive for chronic severe  atherosclerosis which has progressed since a 2019 CTA as follows: - New Severe stenosis or Short Segment Occlusion of the distal Left Vertebral Artery V4 segment. - New diminutive caliber of the Basilar Artery which remains patent. - RADIOGRAPHIC STRING SIGN stenosis  of the proximal Right ICA has progressed. Chronic moderate to severe Left ICA siphon stenosis is stable. - progressed Left ICA supraclinoid stenosis which is now moderate to severe. Stable proximal left Left 70% stenosis. - progressed and Severe bilateral PCA P1/P2 stenoses. - progressed and moderate Right MCA distal M1 stenosis. 3. Stable moderate bilateral proximal MCA stenoses. Stable proximal left subclavian artery plaque with 50% stenosis. Electronically Signed   By: Genevie Ann M.D.   On: 08/22/2019 11:59   CT ANGIO NECK CODE STROKE  Result Date: 08/22/2019 CLINICAL DATA:  84 year old female with a small acute right superior frontal white matter lacunar infarct after presenting with arm weakness and confusion. EXAM: CT ANGIOGRAPHY HEAD AND NECK TECHNIQUE: Multidetector CT imaging of the head and neck was performed using the standard protocol during bolus administration of intravenous contrast. Multiplanar CT image reconstructions and MIPs were obtained to evaluate the vascular anatomy. Carotid stenosis measurements (when applicable) are obtained utilizing NASCET criteria, using the distal internal carotid diameter as the denominator. CONTRAST:  34mL OMNIPAQUE IOHEXOL 350 MG/ML SOLN COMPARISON:  Brain MRI and plain head CT 08/21/2019. CTA head and neck and CTP 06/20/2017. FINDINGS: CTA NECK Skeleton: Carious dentition. Chronic cervical spine degeneration. No acute osseous abnormality identified. Upper chest: Chronic apical lung scarring is stable. No superior mediastinal lymphadenopathy. Unchanged borderline to mild circumferential wall thickening of the upper thoracic esophagus. Other neck: Negative. Aortic arch: 3 vessel arch configuration. Stable  mild distal arch calcified plaque. Right carotid system: Negative brachiocephalic artery and proximal right CCA. Mild plaque proximal to the bifurcation without stenosis. Bulky chronic mostly calcified plaque at the right ICA origin and bulb resulting in radiographic string sign stenosis (series 5, image 94) over the initial 10 mm segment of the vessel. This appears mildly progressed. The right ICA remains patent and is otherwise negative to the skull base. Left carotid system: Minimal plaque at the left CCA origin without stenosis. Mild plaque proximal to the bifurcation without stenosis. Chronic bulky calcified plaque at the left ICA origin resulting in stenosis numerically estimated at 70% with respect to the distal vessel (series 9, image 126). This is stable. Left ICA is tortuous and remains patent distal to the stenosis. Vertebral arteries: Calcified proximal right subclavian artery plaque without stenosis. Dominant right vertebral artery is mildly ectatic without plaque or stenosis to the skull base. Moderate calcified plaque in the proximal left subclavian artery with up to 50% stenosis appears stable. Mild calcified plaque at the non dominant left vertebral artery origin not resulting in stenosis. The left vertebral remains non dominant to the skull base without stenosis. CTA HEAD Posterior circulation: Dominant right vertebral artery primarily supplies the basilar with minimal calcified plaque and no stenosis. Normal right PICA origin. Left V4 segment is non dominant with new severe stenosis or short segment occlusion just proximal to the vertebrobasilar junction on series 11, image 20. This is about 3 mm in length. The basilar artery is patent but newly diminutive since 2019 with multifocal irregularity. Only mild focal basilar artery stenosis results. The SCA and PCA origins remain patent. Chronic bilateral PCA atherosclerosis has progressed now with severe bilateral P1 and P2 segment stenoses (series  11, images 17 and 18). Despite this there is distal bilateral PCA enhancement. Anterior circulation: Both ICA siphons are patent but heavily calcified. On the left there is severe supraclinoid stenosis on series 8, image 63, mildly progressed since 2019. On the right there is moderate to severe cavernous and proximal supraclinoid stenosis  due to bulky calcified plaque which appears stable. Carotid termini remain patent. Mild-to-moderate bilateral chronic proximal MCA stenosis has not significantly changed. A1 segments remain normal. Mild bilateral ACA branch irregularity is stable. Distal right M1 stenosis is moderate and increased (series 10, image 16). Left MCA bifurcation remains patent, with stable left MCA branches demonstrating multifocal mild irregularity. Right MCA bifurcation remains patent with stable right MCA branches demonstrating multifocal mild irregularity. Venous sinuses: Patent. Anatomic variants: Dominant right vertebral artery. Review of the MIP images confirms the above findings IMPRESSION: 1. Negative for large vessel occlusion. 2. But positive for chronic severe atherosclerosis which has progressed since a 2019 CTA as follows: - New Severe stenosis or Short Segment Occlusion of the distal Left Vertebral Artery V4 segment. - New diminutive caliber of the Basilar Artery which remains patent. - RADIOGRAPHIC STRING SIGN stenosis of the proximal Right ICA has progressed. Chronic moderate to severe Left ICA siphon stenosis is stable. - progressed Left ICA supraclinoid stenosis which is now moderate to severe. Stable proximal left Left 70% stenosis. - progressed and Severe bilateral PCA P1/P2 stenoses. - progressed and moderate Right MCA distal M1 stenosis. 3. Stable moderate bilateral proximal MCA stenoses. Stable proximal left subclavian artery plaque with 50% stenosis. Electronically Signed   By: Genevie Ann M.D.   On: 08/22/2019 11:59    PHYSICAL EXAM Pleasant elderly Caucasian lady not in  distress. . Afebrile. Head is nontraumatic. Neck is supple without bruit.    Cardiac exam no murmur or gallop. Lungs are clear to auscultation. Distal pulses are well felt. Neurological Exam : Patient is awake alert has right gaze preference and right head deviation.  She has left inattention.  Blinks to threat on the right but not on the left.  Follows simple commands.  Speech is clear without dysarthria or aphasia.  Mild left lower facial asymmetry.  Tongue midline.  Motor system exam able to move her right-sided extremities more than left side but is able to move left side well also except mild left upper extremity weakness. ASSESSMENT/PLAN Ms. Jaclyn [name] is a 84 y.o. female with history of atrial fibrillation on Eliquis, diabetes, bilateral ICA stenosis, hyperlipidemia, fibromyalgia, headaches, multiple TIAs with left-sided symptoms with no residual deficits presenting with Left-sided weakness, altered mental status, urinary frequency, hyperglycemia.   Stroke:   R MCA small subcortical infarct in setting of severe intracranial large vessel disease, notably the R ICA and AF on Eliquis.. Neurological exams appears out of proportion to the tiny infarct and possibility of underlying seizures is also consideration  CT head No acute abnormality. Small vessel disease. Atrophy.   MRI  Posterior R frontal corona radiata / centrum semiovale periventricular infarct   CTA head & neck progressive chronic severe atherosclerosis (new severe distal L V4, new small caliber BA, string sign proximal R ICA, moderate to severe L ICA siphon, moderate to severe L ICA supraclinoid 70%, severe B P1/2, moderate R M1)  2D Echo EF 65-70 %. No source of embolus   EEG pending LDL 103  HgbA1c 14.2  Eliquis for VTE prophylaxis  Eliquis (apixaban) daily prior to admission, now on Eliquis (apixaban) daily.    Therapy recommendations:  CIR  Disposition:  pending   Atrial Fibrillation  Home anticoagulation:   Eliquis (apixaban) daily , continued in the hospital . Continue Eliquis (apixaban) daily at discharge   Carotid Stenosis   CTA neck R ICA string sign, L ICA 70%  Dr. Oneida Alar saw 06/2017 w/ OP f/u planned.  Pt hesitant for OR.   May  Need PTA/stent if RICA stenosis has progressed  Hypertension  Stable . Permissive hypertension (OK if < 220/120) but gradually normalize in 5-7 days . Long-term BP goal 130-150 give severe intracranial and neck atherosclerosis   Hyperlipidemia  Home meds:  No statin  Now on lipitor 10  LDL 103, goal < 70  Agree with plan for OP Statin plus CoQ10 (hx myalgia w/ statins) and if intolerant to this would consider PCSK9 inhibitors  Continue statin at discharge  Diabetes type II Uncontrolled  HgbA1c 14.2, goal < 7.0  Other Stroke Risk Factors  Advanced age  Family hx stroke (father)  Other Active Problems  Baseline mild dementia  Anxiety, depression  Hypothyroid   Hospital day # 0 Recommend check EEG for seizure activity.  Check carotid ultrasound to follow up on her previous moderate right carotid stenosis from a year ago.  Mobilize out of bed.  Therapy consults.  Continue Eliquis given history of atrial fibrillation.  Long discussion with the patient and her son and answered questions.  Greater than 50% time during this 35-minute visit was spent on counseling and coordination of care and discussion with care team about her stroke, carotid stenosis and atrial fibrillation and answering questions Antony Contras, MD  To contact Stroke Continuity provider, please refer to [email or web address] After hours, contact General Neurology

## 2019-08-22 NOTE — Progress Notes (Signed)
Echocardiogram 2D Echocardiogram has been performed.  [place] Deputy Donyel Castagnola 08/22/2019, 9:06 AM

## 2019-08-22 NOTE — Progress Notes (Signed)
PROGRESS NOTE    Torin Whisner  WVP:[id number] DOB: 1936-01-09 DOA: 08/21/2019 PCP: Jinny Sanders, MD   Chief Complaint  Patient presents with  . Urinary Frequency  . Hyperglycemia    Brief Narrative:  84 yo F with hx of atrial fibrillation on eliquis, Carotid artery stenosis, fibromyalgia, TIA, T2DM, hypothyroidism, hypertension and multiple other medical problems who presents with L arm pain/weakness for 2 days in addition to confusion and concern for UTI symptoms.  She had MRI notable for Jyles Sontag 6 mm acute ischemic nonhemorrhagic periventricular white matter infarct and has been admitted for stroke work up.   Assessment & Plan:   Principal Problem:   Acute CVA (cerebrovascular accident) ([place]) Active Problems:   Hypothyroidism   Poorly controlled type 2 diabetes mellitus with complication ([place])   PAROXYSMAL ATRIAL FIBRILLATION   Stenosis of right carotid artery   Mild dementia ([place])   CVA (cerebral vascular accident) ([place])  Acute Stroke  6 mm Acute Ischemic Nonhemorrhagic Periventricular White Matter Infarct Involving the Posterior R Frontal Corona radiata/Centrum Semi [place]:  MRI showing acute stroke CTA head/neck without LVO, but notable for chronic severe atherosclerosis, progressed since 2019 (severe stenosis or short segment occlusion of distal L vertebral artery V4 segment, diminutive calibar of basilar artery, radiographic string sign stenosis of proximal R ICA, chronic moderate to severe L ICA siphon stenosis, progressed L ICA supraclinoid stenosis, now mod to severe - stable proximal L 70% stenosis.  Progressed and severe bilateral PCA P1/P2 stenoses.  Progressed and moderate R MCA distal M1 stenosis.  Stable moderate bilateral proximal MCA stenoses.  Stable proximal L subclavian artery plaque with 50% stenosis. Neurology c/s, appreciate recs - neuro exam out of proportion to infarct, obtain EEG to r/o underlying seizures - continue eliquis - follow carotid dopplers Permissive  hypertension lipitor 10 mg daily (plan to ad CoQ10 outpatient, pt previously did not tolerate this) PT/OT/SLP - > recommending CIR Echo with EF 65-70% (see report)  EEG normal   Acute Metabolic Encephalopathy  Delirium: suspect 2/2 above B12 wnl, tsh wnl Follow ammonia UA not c/w UTI - son very concerned about this possibility, follow culture Delirium precautions  Atrial Fibrillation: eliquis   T2DM: continue SSI and lantus, tradjenta  Hypothyroidism: synthroid  B12 deficiency: continue B12 supplementation  Recurrent UTI: continue prophylaxis  DVT prophylaxis: eliquis Code Status: full  Family Communication: son at bedside Disposition:   Status is: Inpatient  Remains inpatient appropriate because:Inpatient level of care appropriate due to severity of illness   Dispo: The patient is from: Home              Anticipated d/c is to: Home              Anticipated d/c date is: 1 day              Patient currently is not medically stable to d/c.  Consultants:   neurology  Procedures:  EEG IMPRESSION: Normal electroencephalogram, awake and drowsy. There are no focal lateralizing or epileptiform features.  Antimicrobials: Anti-infectives (From admission, onward)   None     Subjective: Confused Notes L sided weakness Son notes this is how she is when she has UTI  Objective: Vitals:   08/22/19 0626 08/22/19 0800 08/22/19 1302 08/22/19 1542  BP: 127/66 129/76 114/79 136/62  Pulse: 79 78 75 68  Resp: 18 20 20 17   Temp: 98.1 F (36.7 C) 97.9 F (36.6 C) 98 F (36.7 C) 97.8 F (36.6 C)  TempSrc:  Oral Oral Oral Oral  SpO2: 96% 96% 100% 100%  Weight:      Height:        Intake/Output Summary (Last 24 hours) at 08/22/2019 1558 Last data filed at 08/22/2019 0600 Gross per 24 hour  Intake 0 ml  Output 0 ml  Net 0 ml   Filed Weights   08/21/19 1857  Weight: 66 kg    Examination:  General exam: Appears calm and comfortable  Respiratory system: Clear to  auscultation. Respiratory effort normal. Cardiovascular system: S1 & S2 heard, RRR.  Gastrointestinal system: Abdomen is nondistended, soft and nontender. Central nervous system: Alert and disoriented.  LUE weakness antigravity.  Extremities: no lee  Skin: No rashes, lesions or ulcers Psychiatry: Judgement and insight appear normal. Mood & affect appropriate.     Data Reviewed: I have personally reviewed following labs and imaging studies  CBC: Recent Labs  Lab 08/21/19 1939 08/22/19 1137  WBC 6.7 7.2  NEUTROABS 4.3 4.6  HGB 11.4* 12.2  HCT 34.3* 36.0  MCV 90.0 88.9  PLT 197 025    Basic Metabolic Panel: Recent Labs  Lab 08/21/19 1939 08/22/19 1137  NA 132* 139  K 3.9 3.6  CL 101 107  CO2 21* 21*  GLUCOSE 360* 127*  BUN 9 6*  CREATININE 0.75 0.65  CALCIUM 8.4* 8.7*    GFR: Estimated Creatinine Clearance: 48.4 mL/min (by C-G formula based on SCr of 0.65 mg/dL).  Liver Function Tests: Recent Labs  Lab 08/21/19 1939 08/22/19 1137  AST 20 19  ALT 18 16  ALKPHOS 62 60  BILITOT 1.1 0.6  PROT 5.8* 6.4*  ALBUMIN 3.2* 3.4*    CBG: Recent Labs  Lab 08/21/19 1851 08/22/19 0627 08/22/19 1234 08/22/19 1544  GLUCAP 350* 353* 136* 165*     Recent Results (from the past 240 hour(s))  SARS Coronavirus 2 by RT PCR (hospital order, performed in [facility] hospital lab) Nasopharyngeal Nasopharyngeal Swab     Status: None   Collection Time: 08/22/19  1:04 AM   Specimen: Nasopharyngeal Swab  Result Value Ref Range Status   SARS Coronavirus 2 NEGATIVE NEGATIVE Final    Comment: (NOTE) SARS-CoV-2 target nucleic acids are NOT DETECTED.  The SARS-CoV-2 RNA is generally detectable in upper and lower respiratory specimens during the acute phase of infection. The lowest concentration of SARS-CoV-2 viral copies this assay can detect is 250 copies / mL. Lavone Barrientes negative result does not preclude SARS-CoV-2 infection and should not be used as the sole basis for treatment or  other patient management decisions.  Belkys Henault negative result may occur with improper specimen collection / handling, submission of specimen other than nasopharyngeal swab, presence of viral mutation(s) within the areas targeted by this assay, and inadequate number of viral copies (<250 copies / mL). Linell [name] negative result must be combined with clinical observations, patient history, and epidemiological information.  Fact Sheet for Patients:   StrictlyIdeas.no  Fact Sheet for Healthcare Providers: BankingDealers.co.za  This test is not yet approved or  cleared by the [place] FDA and has been authorized for detection and/or diagnosis of SARS-CoV-2 by FDA under an Emergency Use Authorization (EUA).  This EUA will remain in effect (meaning this test can be used) for the duration of the COVID-19 declaration under Section 564(b)(1) of the Act, 21 U.S.C. section 360bbb-3(b)(1), unless the authorization is terminated or revoked sooner.  Performed at [facility], [place] [address], [place], [place] [address]  Radiology Studies: CT Head Wo Contrast  Result Date: 08/21/2019 CLINICAL DATA:  Neuro deficits. Subacute arm weakness. Increasingly confused since Friday. EXAM: CT HEAD WITHOUT CONTRAST TECHNIQUE: Contiguous axial images were obtained from the base of the skull through the vertex without intravenous contrast. COMPARISON:  01/24/2019 FINDINGS: Brain: Moderate central and cortical atrophy. Periventricular white matter changes are consistent with small vessel disease. Vascular: Dense atherosclerotic calcification of the internal carotid arteries. No hyperdense vessels. Skull: Normal. Negative for fracture or focal lesion. Sinuses/Orbits: No acute finding. Other: None. IMPRESSION: 1. No evidence for acute intracranial abnormality. 2. Atrophy and small vessel disease. Electronically Signed   By: Nolon Nations M.D.   On: 08/21/2019 20:58     MR BRAIN WO CONTRAST  Result Date: 08/21/2019 CLINICAL DATA:  Initial evaluation for increased confusion, focal neural deficit. EXAM: MRI HEAD WITHOUT CONTRAST TECHNIQUE: Multiplanar, multiecho pulse sequences of the brain and surrounding structures were obtained without intravenous contrast. COMPARISON:  Prior head CT from earlier the same day. FINDINGS: Brain: Examination mildly degraded by motion artifact. Generalized age-related cerebral atrophy. Patchy and confluent T2/FLAIR hyperintensity within the periventricular and deep white matter of both cerebral hemispheres most consistent with chronic small vessel ischemic disease, moderate in nature. Patchy involvement of the pons noted. 6 mm acute ischemic infarcts seen involving the periventricular white matter of the posterior right frontal corona radiata/centrum semi ovale (series 5, image 83). No associated hemorrhage or mass effect. No other evidence for acute or subacute ischemia. Gray-white matter differentiation otherwise maintained. No encephalomalacia to suggest chronic cortical infarction. No acute intracranial hemorrhage. Single chronic microhemorrhage noted at the inferior cerebellum, likely hypertensive in nature. No other evidence for chronic intracranial hemorrhage. No mass lesion, midline shift or mass effect. Diffuse ventricular prominence most likely related to global parenchymal volume loss without hydrocephalus. No extra-axial fluid collection. Pituitary gland suprasellar region within normal limits. Midline structures intact. Vascular: Major intracranial vascular flow voids are maintained. Skull and upper cervical spine: Craniocervical junction within normal limits. Bone marrow signal intensity normal. No scalp soft tissue abnormality. Sinuses/Orbits: Patient status post ocular lens replacement on the left. Globes and orbital soft tissues demonstrate no acute finding. Mild scattered mucosal thickening noted throughout the ethmoidal air  cells and maxillary sinuses. Trace bilateral mastoid effusions noted, of doubtful significance. Visualized nasopharynx within normal limits. Other: None. IMPRESSION: 1. 6 mm acute ischemic nonhemorrhagic periventricular white matter infarct involving the posterior right frontal corona radiata/centrum semi ovale. 2. No other acute intracranial abnormality. 3. Age-related cerebral atrophy with moderate chronic microvascular ischemic disease. Electronically Signed   By: Jeannine Boga M.D.   On: 08/21/2019 23:44   EEG adult  Result Date: 08/22/2019 [name], MD     08/22/2019  3:21 PM ELECTROENCEPHALOGRAM REPORT Patient: Alba Perillo       Room #: 7L89Q EEG No. ID: [id number] Age: 84 y.o.        Sex: female Requesting Physician: Florene Glen Report Date:  08/22/2019       Interpreting Physician: [name] History: Sherry Blackard is an 84 y.o. female with altered mental status Medications: Eliquis, Lipitor, Insulin, Synthroid, Tradjenta Conditions of Recording:  This is Wilver Tignor 21 channel routine scalp EEG performed with bipolar and monopolar montages arranged in accordance to the international 10/20 system of electrode placement. One channel was dedicated to EKG recording. The patient is in the awake and drowsy states. Description:  The patient is uncooperative and muscle and movement artifact are prominent during the recording.  When Vondell Babers  waking background activity can be evaluated it consists of Awanda Wilcock low voltage, symmetrical, fairly well organized, 8 Hz alpha activity, seen from the parieto-occipital and posterior temporal regions.  Low voltage fast activity, poorly organized, is seen anteriorly and is at times superimposed on more posterior regions.  Ashok Sawaya mixture of theta and alpha rhythms are seen from the central and temporal regions. The patient drowses with slowing to irregular, low voltage theta and beta activity.  Stage II sleep is not obtained. No epileptiform activity is noted.  Hyperventilation and  intermittent photic stimulation were not performed. IMPRESSION: Normal electroencephalogram, awake and drowsy. There are no focal lateralizing or epileptiform features. [name], MD Neurology [phone] 08/22/2019, 3:17 PM   ECHOCARDIOGRAM COMPLETE  Result Date: 08/22/2019    ECHOCARDIOGRAM REPORT   Patient Name:   AMESHIA PEWITT Date of Exam: 08/22/2019 Medical Rec #:  [id number]       Height:       62.0 in Accession #:    [id number]      Weight:       145.5 lb Date of Birth:  [birth year]       BSA:          1.670 m Patient Age:    52 years        BP:           127/66 mmHg Patient Gender: F               HR:           70 bpm. Exam Location:  Inpatient Procedure: 2D Echo, Color Doppler and Cardiac Doppler Indications:    Stroke i163.9  History:        Patient has prior history of Echocardiogram examinations, most                 recent 06/21/2017. Arrythmias:Atrial Fibrillation; Risk                 Factors:Hypertension, Diabetes and Dyslipidemia. COVID+ on                 01/15/19.  Sonographer:    [name] Senior RDCS Referring Phys: [id number] [place]  1. Left ventricular ejection fraction, by estimation, is 65 to 70%. The left ventricle has normal function. The left ventricle has no regional wall motion abnormalities. There is moderate left ventricular hypertrophy. Left ventricular diastolic parameters are indeterminate.  2. Right ventricular systolic function is normal. The right ventricular size is normal. Mildly increased right ventricular wall thickness. Tricuspid regurgitation signal is inadequate for assessing PA pressure.  3. The mitral valve is grossly normal, mild annular calcification. Trivial mitral valve regurgitation.  4. The aortic valve is tricuspid, mild annular calcification. Aortic valve regurgitation is not visualized.  5. The inferior vena cava is normal in size with greater than 50% respiratory variability, suggesting right atrial pressure of 3 mmHg. FINDINGS  Left  Ventricle: Left ventricular ejection fraction, by estimation, is 65 to 70%. The left ventricle has normal function. The left ventricle has no regional wall motion abnormalities. The left ventricular internal cavity size was small. There is moderate  left ventricular hypertrophy. Left ventricular diastolic parameters are indeterminate. Right Ventricle: The right ventricular size is normal. Mildly increased right ventricular wall thickness. Right ventricular systolic function is normal. Tricuspid regurgitation signal is inadequate for assessing PA pressure. Left Atrium: Left atrial size was normal in size. Right Atrium: Right atrial size was normal in size. Pericardium: Trivial pericardial effusion is present.  The pericardial effusion is posterior to the left ventricle. Presence of pericardial fat pad. Mitral Valve: The mitral valve is grossly normal. Mild mitral annular calcification. Trivial mitral valve regurgitation. Tricuspid Valve: The tricuspid valve is grossly normal. Tricuspid valve regurgitation is trivial. Aortic Valve: The aortic valve is tricuspid. Aortic valve regurgitation is not visualized. Mild aortic valve annular calcification. Pulmonic Valve: The pulmonic valve was grossly normal. Pulmonic valve regurgitation is trivial. Aorta: The aortic root is normal in size and structure. Venous: The inferior vena cava is normal in size with greater than 50% respiratory variability, suggesting right atrial pressure of 3 mmHg. IAS/Shunts: No atrial level shunt detected by color flow Doppler.  LEFT VENTRICLE PLAX 2D LVIDd:         2.60 cm  Diastology LVIDs:         1.60 cm  LV e' lateral:   4.90 cm/s LV PW:         1.50 cm  LV E/e' lateral: 15.6 LV IVS:        1.30 cm  LV e' medial:    5.66 cm/s LVOT diam:     1.60 cm  LV E/e' medial:  13.5 LV SV:         36 LV SV Index:   22 LVOT Area:     2.01 cm  RIGHT VENTRICLE RV S prime:     12.00 cm/s TAPSE (M-mode): 1.7 cm LEFT ATRIUM             Index       RIGHT ATRIUM            Index LA diam:        1.70 cm 1.02 cm/m  RA Area:     10.30 cm LA Vol (A2C):   28.0 ml 16.77 ml/m RA Volume:   22.40 ml  13.41 ml/m LA Vol (A4C):   27.4 ml 16.41 ml/m LA Biplane Vol: 28.5 ml 17.07 ml/m  AORTIC VALVE LVOT Vmax:   76.30 cm/s LVOT Vmean:  54.100 cm/s LVOT VTI:    0.179 m  AORTA Ao Root diam: 3.10 cm Ao Asc diam:  3.00 cm MITRAL VALVE MV Area (PHT): 1.91 cm    SHUNTS MV Decel Time: 398 msec    Systemic VTI:  0.18 m MV E velocity: 76.40 cm/s  Systemic Diam: 1.60 cm MV Chaundra Abreu velocity: 90.00 cm/s MV E/Jabaree Mercado ratio:  0.85 Rozann Lesches MD Electronically signed by Rozann Lesches MD Signature Date/Time: 08/22/2019/11:43:40 AM    Final    CT ANGIO HEAD CODE STROKE  Result Date: 08/22/2019 CLINICAL DATA:  84 year old female with Mazikeen Hehn small acute right superior frontal white matter lacunar infarct after presenting with arm weakness and confusion. EXAM: CT ANGIOGRAPHY HEAD AND NECK TECHNIQUE: Multidetector CT imaging of the head and neck was performed using the standard protocol during bolus administration of intravenous contrast. Multiplanar CT image reconstructions and MIPs were obtained to evaluate the vascular anatomy. Carotid stenosis measurements (when applicable) are obtained utilizing NASCET criteria, using the distal internal carotid diameter as the denominator. CONTRAST:  33mL OMNIPAQUE IOHEXOL 350 MG/ML SOLN COMPARISON:  Brain MRI and plain head CT 08/21/2019. CTA head and neck and CTP 06/20/2017. FINDINGS: CTA NECK Skeleton: Carious dentition. Chronic cervical spine degeneration. No acute osseous abnormality identified. Upper chest: Chronic apical lung scarring is stable. No superior mediastinal lymphadenopathy. Unchanged borderline to mild circumferential wall thickening of the upper thoracic esophagus. Other neck: Negative. Aortic arch: 3 vessel arch configuration. Stable mild distal  arch calcified plaque. Right carotid system: Negative brachiocephalic artery and proximal right CCA. Mild  plaque proximal to the bifurcation without stenosis. Bulky chronic mostly calcified plaque at the right ICA origin and bulb resulting in radiographic string sign stenosis (series 5, image 94) over the initial 10 mm segment of the vessel. This appears mildly progressed. The right ICA remains patent and is otherwise negative to the skull base. Left carotid system: Minimal plaque at the left CCA origin without stenosis. Mild plaque proximal to the bifurcation without stenosis. Chronic bulky calcified plaque at the left ICA origin resulting in stenosis numerically estimated at 70% with respect to the distal vessel (series 9, image 126). This is stable. Left ICA is tortuous and remains patent distal to the stenosis. Vertebral arteries: Calcified proximal right subclavian artery plaque without stenosis. Dominant right vertebral artery is mildly ectatic without plaque or stenosis to the skull base. Moderate calcified plaque in the proximal left subclavian artery with up to 50% stenosis appears stable. Mild calcified plaque at the non dominant left vertebral artery origin not resulting in stenosis. The left vertebral remains non dominant to the skull base without stenosis. CTA HEAD Posterior circulation: Dominant right vertebral artery primarily supplies the basilar with minimal calcified plaque and no stenosis. Normal right PICA origin. Left V4 segment is non dominant with new severe stenosis or short segment occlusion just proximal to the vertebrobasilar junction on series 11, image 20. This is about 3 mm in length. The basilar artery is patent but newly diminutive since 2019 with multifocal irregularity. Only mild focal basilar artery stenosis results. The SCA and PCA origins remain patent. Chronic bilateral PCA atherosclerosis has progressed now with severe bilateral P1 and P2 segment stenoses (series 11, images 17 and 18). Despite this there is distal bilateral PCA enhancement. Anterior circulation: Both ICA siphons  are patent but heavily calcified. On the left there is severe supraclinoid stenosis on series 8, image 63, mildly progressed since 2019. On the right there is moderate to severe cavernous and proximal supraclinoid stenosis due to bulky calcified plaque which appears stable. Carotid termini remain patent. Mild-to-moderate bilateral chronic proximal MCA stenosis has not significantly changed. A1 segments remain normal. Mild bilateral ACA branch irregularity is stable. Distal right M1 stenosis is moderate and increased (series 10, image 16). Left MCA bifurcation remains patent, with stable left MCA branches demonstrating multifocal mild irregularity. Right MCA bifurcation remains patent with stable right MCA branches demonstrating multifocal mild irregularity. Venous sinuses: Patent. Anatomic variants: Dominant right vertebral artery. Review of the MIP images confirms the above findings IMPRESSION: 1. Negative for large vessel occlusion. 2. But positive for chronic severe atherosclerosis which has progressed since Sequita Wise 2019 CTA as follows: - New Severe stenosis or Short Segment Occlusion of the distal Left Vertebral Artery V4 segment. - New diminutive caliber of the Basilar Artery which remains patent. - RADIOGRAPHIC STRING SIGN stenosis of the proximal Right ICA has progressed. Chronic moderate to severe Left ICA siphon stenosis is stable. - progressed Left ICA supraclinoid stenosis which is now moderate to severe. Stable proximal left Left 70% stenosis. - progressed and Severe bilateral PCA P1/P2 stenoses. - progressed and moderate Right MCA distal M1 stenosis. 3. Stable moderate bilateral proximal MCA stenoses. Stable proximal left subclavian artery plaque with 50% stenosis. Electronically Signed   By: Genevie Ann M.D.   On: 08/22/2019 11:59   CT ANGIO NECK CODE STROKE  Result Date: 08/22/2019 CLINICAL DATA:  84 year old female with Suhas Estis small acute right superior  frontal white matter lacunar infarct after presenting with  arm weakness and confusion. EXAM: CT ANGIOGRAPHY HEAD AND NECK TECHNIQUE: Multidetector CT imaging of the head and neck was performed using the standard protocol during bolus administration of intravenous contrast. Multiplanar CT image reconstructions and MIPs were obtained to evaluate the vascular anatomy. Carotid stenosis measurements (when applicable) are obtained utilizing NASCET criteria, using the distal internal carotid diameter as the denominator. CONTRAST:  75mL OMNIPAQUE IOHEXOL 350 MG/ML SOLN COMPARISON:  Brain MRI and plain head CT 08/21/2019. CTA head and neck and CTP 06/20/2017. FINDINGS: CTA NECK Skeleton: Carious dentition. Chronic cervical spine degeneration. No acute osseous abnormality identified. Upper chest: Chronic apical lung scarring is stable. No superior mediastinal lymphadenopathy. Unchanged borderline to mild circumferential wall thickening of the upper thoracic esophagus. Other neck: Negative. Aortic arch: 3 vessel arch configuration. Stable mild distal arch calcified plaque. Right carotid system: Negative brachiocephalic artery and proximal right CCA. Mild plaque proximal to the bifurcation without stenosis. Bulky chronic mostly calcified plaque at the right ICA origin and bulb resulting in radiographic string sign stenosis (series 5, image 94) over the initial 10 mm segment of the vessel. This appears mildly progressed. The right ICA remains patent and is otherwise negative to the skull base. Left carotid system: Minimal plaque at the left CCA origin without stenosis. Mild plaque proximal to the bifurcation without stenosis. Chronic bulky calcified plaque at the left ICA origin resulting in stenosis numerically estimated at 70% with respect to the distal vessel (series 9, image 126). This is stable. Left ICA is tortuous and remains patent distal to the stenosis. Vertebral arteries: Calcified proximal right subclavian artery plaque without stenosis. Dominant right vertebral artery is  mildly ectatic without plaque or stenosis to the skull base. Moderate calcified plaque in the proximal left subclavian artery with up to 50% stenosis appears stable. Mild calcified plaque at the non dominant left vertebral artery origin not resulting in stenosis. The left vertebral remains non dominant to the skull base without stenosis. CTA HEAD Posterior circulation: Dominant right vertebral artery primarily supplies the basilar with minimal calcified plaque and no stenosis. Normal right PICA origin. Left V4 segment is non dominant with new severe stenosis or short segment occlusion just proximal to the vertebrobasilar junction on series 11, image 20. This is about 3 mm in length. The basilar artery is patent but newly diminutive since 2019 with multifocal irregularity. Only mild focal basilar artery stenosis results. The SCA and PCA origins remain patent. Chronic bilateral PCA atherosclerosis has progressed now with severe bilateral P1 and P2 segment stenoses (series 11, images 17 and 18). Despite this there is distal bilateral PCA enhancement. Anterior circulation: Both ICA siphons are patent but heavily calcified. On the left there is severe supraclinoid stenosis on series 8, image 63, mildly progressed since 2019. On the right there is moderate to severe cavernous and proximal supraclinoid stenosis due to bulky calcified plaque which appears stable. Carotid termini remain patent. Mild-to-moderate bilateral chronic proximal MCA stenosis has not significantly changed. A1 segments remain normal. Mild bilateral ACA branch irregularity is stable. Distal right M1 stenosis is moderate and increased (series 10, image 16). Left MCA bifurcation remains patent, with stable left MCA branches demonstrating multifocal mild irregularity. Right MCA bifurcation remains patent with stable right MCA branches demonstrating multifocal mild irregularity. Venous sinuses: Patent. Anatomic variants: Dominant right vertebral artery.  Review of the MIP images confirms the above findings IMPRESSION: 1. Negative for large vessel occlusion. 2. But positive for chronic  severe atherosclerosis which has progressed since Jemari Hallum 2019 CTA as follows: - New Severe stenosis or Short Segment Occlusion of the distal Left Vertebral Artery V4 segment. - New diminutive caliber of the Basilar Artery which remains patent. - RADIOGRAPHIC STRING SIGN stenosis of the proximal Right ICA has progressed. Chronic moderate to severe Left ICA siphon stenosis is stable. - progressed Left ICA supraclinoid stenosis which is now moderate to severe. Stable proximal left Left 70% stenosis. - progressed and Severe bilateral PCA P1/P2 stenoses. - progressed and moderate Right MCA distal M1 stenosis. 3. Stable moderate bilateral proximal MCA stenoses. Stable proximal left subclavian artery plaque with 50% stenosis. Electronically Signed   By: Genevie Ann M.D.   On: 08/22/2019 11:59        Scheduled Meds: . apixaban  5 mg Oral BID  . atorvastatin  10 mg Oral Daily  . insulin aspart  0-15 Units Subcutaneous TID WC  . insulin aspart  0-5 Units Subcutaneous QHS  . insulin glargine  65 Units Subcutaneous Daily  . levothyroxine  112 mcg Oral Daily  . linagliptin  5 mg Oral Daily  . vitamin B-12  1,000 mcg Oral Daily   Continuous Infusions:   LOS: 0 days    Time spent: over 30 min    Fayrene Helper, MD [facility]   To contact the attending provider between 7A-7P or the covering provider during after hours 7P-7A, please log into the web site [email or web address] and access using universal [place] password for that web site. If you do not have the password, please call the hospital operator.  08/22/2019, 3:58 PM

## 2019-08-22 NOTE — Progress Notes (Signed)
EEG complete - results pending 

## 2019-08-22 NOTE — ED Notes (Signed)
Attempted to call report to 3W 

## 2019-08-22 NOTE — H&P (Signed)
History and Physical    Jaclyn [name] CLE:[id number] DOB: 04/04/1935 DOA: 08/21/2019  PCP: Jinny Sanders, MD Consultants:  neurology Patient coming from:  Home  Chief Complaint: urinary frequency and hyperglycemia, L arm weakness, possible fall   HPI: Jaclyn [name] is a 84 y.o. female with medical history significant of  Past Medical History:  Diagnosis Date  . Atrial fibrillation ([place])   . Benign neoplasm of colon   . Carotid artery occlusion    60-79% right ICA stenosis  . Carotid bruit   . Cerebrovascular disease, unspecified   . Difficult intubation 2008   During surgery to remove large polyp  . Diverticulosis of colon (without mention of hemorrhage)   . Dysthymic disorder   . Fibromyalgia   . Headache(784.0)   . Insomnia, unspecified   . Interstitial cystitis   . Myalgia and myositis, unspecified   . Osteoarthrosis, unspecified whether generalized or localized, unspecified site   . Other and unspecified hyperlipidemia   . Other chest pain   . Other specified benign mammary dysplasias   . TIA (transient ischemic attack)   . Type II or unspecified type diabetes mellitus without mention of complication, not stated as uncontrolled   . Unspecified essential hypertension   . Unspecified hypothyroidism   . Unspecified vitamin D deficiency   . Urinary tract infection, site not specified       Presented w/ cc L arm pain/weakness x2 days, urinary frequency and hyperglycemia x3 days.Confusion per son, clear speech but words not really making sense, sone had concern for UTI as she has presented this way in the past w/ UTI. As of time of interview patient is difficult historian, very HoH, son assists w/ history and confirms the above. Patient is awake and alert. Son reports increased depression since she has been off her duloxetine, usual aches and pains but otherwise no additional concerns.    ED Course:   Labs: (+)urine glucose and ketones no WBC or nitrite;  CBC mild  anemia stable; CMP hyponatremia 132, hyperglycemia 360, hypoalbuminemia w/ calcium WNL corrected for low albumin.   Imaging: CT head no concerns; MRI (+)6 mm acute ischemic nonhemorrhagic periventricular white matter infarct involving the posterior right frontal corona radiata/centrum semi ovale. Age-related cerebral atrophy with moderate chronic microvascular ischemic disease.   Review of Systems: As per HPI; otherwise review of systems reviewed and negative.  Ambulatory Status: typically ambulates without assistance  Past Medical History:  Diagnosis Date  . Atrial fibrillation ([place])   . Benign neoplasm of colon   . Carotid artery occlusion    60-79% right ICA stenosis  . Carotid bruit   . Cerebrovascular disease, unspecified   . Difficult intubation 2008   During surgery to remove large polyp  . Diverticulosis of colon (without mention of hemorrhage)   . Dysthymic disorder   . Fibromyalgia   . Headache(784.0)   . Insomnia, unspecified   . Interstitial cystitis   . Myalgia and myositis, unspecified   . Osteoarthrosis, unspecified whether generalized or localized, unspecified site   . Other and unspecified hyperlipidemia   . Other chest pain   . Other specified benign mammary dysplasias   . TIA (transient ischemic attack)   . Type II or unspecified type diabetes mellitus without mention of complication, not stated as uncontrolled   . Unspecified essential hypertension   . Unspecified hypothyroidism   . Unspecified vitamin D deficiency   . Urinary tract infection, site not specified     Past Surgical  History:  Procedure Laterality Date  . ABDOMINAL HYSTERECTOMY  1988   cervical dysplasia  . CARDIAC CATHETERIZATION  02/19/06   EF 60%  . COLONOSCOPY    . RIGHT COLECTOMY  2005   for villous adenoma of the cecum Dr.streck  . VESICOVAGINAL FISTULA CLOSURE W/ TAH  1990   w/ cystocele &retocele repairs Dr. Ree Edman    Social History   Socioeconomic History  . Marital status:  Single    Spouse name: Not on file  . Number of children: 3  . Years of education: Not on file  . Highest education level: Not on file  Occupational History  . Occupation: retired    Fish farm manager: RETIRED  Tobacco Use  . Smoking status: Never Smoker  . Smokeless tobacco: Never Used  Substance and Sexual Activity  . Alcohol use: No    Alcohol/week: 0.0 standard drinks  . Drug use: No  . Sexual activity: Not on file  Other Topics Concern  . Not on file  Social History Narrative   1 child died at 83 months old --hx of downs syndrome  Lives with son in a 2 story home.  Uses the 1st floor.  Has two living children.  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     Social Determinants of Health   Financial Resource Strain:   . Difficulty of Paying Living Expenses:   Food Insecurity:   . Worried About Charity fundraiser in the Last Year:   . Arboriculturist in the Last Year:   Transportation Needs:   . Film/video editor (Medical):   Marland Kitchen Lack of Transportation (Non-Medical):   Physical Activity:   . Days of Exercise per Week:   . Minutes of Exercise per Session:   Stress:   . Feeling of Stress :   Social Connections:   . Frequency of Communication with Friends and Family:   . Frequency of Social Gatherings with Friends and Family:   . Attends Religious Services:   . Active Member of Clubs or Organizations:   . Attends Archivist Meetings:   Marland Kitchen Marital Status:   Intimate Partner Violence:   . Fear of Current or Ex-Partner:   . Emotionally Abused:   Marland Kitchen Physically Abused:   . Sexually Abused:     Allergies  Allergen Reactions  . Naproxen Sodium Other (See Comments)    Fever/aches and pains  . Statins Other (See Comments)    myalgias  . Sulfonamide Derivatives Hives and Itching  . Latex Itching and Rash  . Shellfish Allergy Itching, Swelling and Rash    Seafood, shrimp    Family History  Problem Relation Age of Onset  . Lymphoma Father   . Hypertension Father    . Stroke Father   . Hyperlipidemia Father   . Hypertension Mother   . Allergies Brother   . Hypertension Sister        3  . Breast cancer Sister   . Fibromyalgia Sister        3  . Hyperlipidemia Sister        3    Prior to Admission medications   Medication Sig Start Date End Date Taking? Authorizing Provider  apixaban (ELIQUIS) 5 MG TABS tablet Take 1 tablet (5 mg total) by mouth 2 (two) times daily. 06/29/19  Yes Bedsole, Amy E, MD  Insulin Glargine (LANTUS SOLOSTAR) 100 UNIT/ML Solostar Pen Inject 65 Units into the skin daily. 03/15/19  Yes Bedsole, Amy E,  MD  levothyroxine (SYNTHROID) 112 MCG tablet Take 1 tablet (112 mcg total) by mouth daily. 09/10/18  Yes Bedsole, Amy E, MD  sitaGLIPtin (JANUVIA) 100 MG tablet Take 1 tablet (100 mg total) by mouth daily. 02/10/19  Yes Bedsole, Amy E, MD  trimethoprim (TRIMPEX) 100 MG tablet Take 1 tablet (100 mg total) by mouth daily. 02/22/19  Yes Bedsole, Amy E, MD  B-D ULTRAFINE III SHORT PEN 31G X 8 MM MISC USE TO INJECT INSULIN ONCE DAILY. DX: E11.65 06/28/19   Bedsole, Amy E, MD  DULoxetine (CYMBALTA) 30 MG capsule Take 2 capsules (60 mg total) by mouth daily. Patient not taking: Reported on 08/21/2019 06/29/19   Jinny Sanders, MD  vitamin B-12 (CYANOCOBALAMIN) 1000 MCG tablet Take 1 tablet (1,000 mcg total) by mouth daily. 09/10/18   Bedsole, Amy E, MD  Vitamin D, Ergocalciferol, (DRISDOL) 1.25 MG (50000 UT) CAPS capsule Take 1 capsule (50,000 Units total) by mouth every 7 (seven) days. 09/10/18   Jinny Sanders, MD    Physical Exam: Vitals:   08/21/19 1927 08/21/19 2015 08/21/19 2156 08/22/19 0014  BP:  132/72    Pulse:  68 77   Resp:  20 16 20   Temp: 98.4 F (36.9 C)     TempSrc: Rectal     Weight:      Height:         . General:  Appears calm and comfortable and is NAD . Eyes:  PERRL, EOMI . ENT:  diminshed hearing, dry lips & tongue; poor dentition . Neck:  no LAD, masses or thyromegaly; no carotid bruits . Cardiovascular:   RRR, no m/r/g. No LE edema.  Marland Kitchen Respiratory:   CTA bilaterally with no wheezes/rales/rhonchi.  Normal respiratory effort. . Abdomen:  soft, NT, ND, NABS . Skin:  no rash or induration seen on limited exam . Musculoskeletal:  grossly normal tone BLE. Weakness LUE decreased grip strength, able to move arm minimally. 5/5 strength RUE.  Marland Kitchen Lower extremity:  No LE edema.  Limited foot exam with no ulcerations.  2+ distal pulses. Marland Kitchen Psychiatric:  Pleasant but confised, speech fluent and appropriate, AOxperson, not to place/time   . Neurologic:  CN 2-12 grossly intact, moves RUE and lower extremities in coordinated fashion, sensation and strength diminished LUE    Radiological Exams on Admission: CT Head Wo Contrast  Result Date: 08/21/2019 CLINICAL DATA:  Neuro deficits. Subacute arm weakness. Increasingly confused since Friday. EXAM: CT HEAD WITHOUT CONTRAST TECHNIQUE: Contiguous axial images were obtained from the base of the skull through the vertex without intravenous contrast. COMPARISON:  01/24/2019 FINDINGS: Brain: Moderate central and cortical atrophy. Periventricular white matter changes are consistent with small vessel disease. Vascular: Dense atherosclerotic calcification of the internal carotid arteries. No hyperdense vessels. Skull: Normal. Negative for fracture or focal lesion. Sinuses/Orbits: No acute finding. Other: None. IMPRESSION: 1. No evidence for acute intracranial abnormality. 2. Atrophy and small vessel disease. Electronically Signed   By: Nolon Nations M.D.   On: 08/21/2019 20:58   MR BRAIN WO CONTRAST  Result Date: 08/21/2019 CLINICAL DATA:  Initial evaluation for increased confusion, focal neural deficit. EXAM: MRI HEAD WITHOUT CONTRAST TECHNIQUE: Multiplanar, multiecho pulse sequences of the brain and surrounding structures were obtained without intravenous contrast. COMPARISON:  Prior head CT from earlier the same day. FINDINGS: Brain: Examination mildly degraded by motion  artifact. Generalized age-related cerebral atrophy. Patchy and confluent T2/FLAIR hyperintensity within the periventricular and deep white matter of both cerebral hemispheres most consistent  with chronic small vessel ischemic disease, moderate in nature. Patchy involvement of the pons noted. 6 mm acute ischemic infarcts seen involving the periventricular white matter of the posterior right frontal corona radiata/centrum semi ovale (series 5, image 83). No associated hemorrhage or mass effect. No other evidence for acute or subacute ischemia. Gray-white matter differentiation otherwise maintained. No encephalomalacia to suggest chronic cortical infarction. No acute intracranial hemorrhage. Single chronic microhemorrhage noted at the inferior cerebellum, likely hypertensive in nature. No other evidence for chronic intracranial hemorrhage. No mass lesion, midline shift or mass effect. Diffuse ventricular prominence most likely related to global parenchymal volume loss without hydrocephalus. No extra-axial fluid collection. Pituitary gland suprasellar region within normal limits. Midline structures intact. Vascular: Major intracranial vascular flow voids are maintained. Skull and upper cervical spine: Craniocervical junction within normal limits. Bone marrow signal intensity normal. No scalp soft tissue abnormality. Sinuses/Orbits: Patient status post ocular lens replacement on the left. Globes and orbital soft tissues demonstrate no acute finding. Mild scattered mucosal thickening noted throughout the ethmoidal air cells and maxillary sinuses. Trace bilateral mastoid effusions noted, of doubtful significance. Visualized nasopharynx within normal limits. Other: None. IMPRESSION: 1. 6 mm acute ischemic nonhemorrhagic periventricular white matter infarct involving the posterior right frontal corona radiata/centrum semi ovale. 2. No other acute intracranial abnormality. 3. Age-related cerebral atrophy with moderate chronic  microvascular ischemic disease. Electronically Signed   By: Jeannine Boga M.D.   On: 08/21/2019 23:44    EKG: Independently reviewed.  NSR with rate 81; evidence old infarct with no evidence of acute ischemia  Labs on Admission: I have personally reviewed the available labs and imaging studies at the time of the admission.       Assessment/Plan Principal Problem:   Acute CVA (cerebrovascular accident) [facility]) Active Problems:   Hypothyroidism   Poorly controlled type 2 diabetes mellitus with complication ([place])   PAROXYSMAL ATRIAL FIBRILLATION   Stenosis of right carotid artery   Mild dementia ([facility])   CVA (cerebral vascular accident) ([place])  NEUROLOGICAL Acute CVA e/ LUE weakness History old CVA and TIA  Echo and carotid US Neurology to follow  Secondary prevention: BP control, Statin plus CoQ10 (hx myalgia w/ statins) and if intolerant to this would consider PCSK9 inhibitors, already on Eliquis will continue this, seek neuology input re: add antiplatelet   CARDIOVASCULAR Hx paroxysmal Afib  Hx carotid stenosis Echo and carotid US pending  Telemetry   PSYCHIATRIC Hx anxiety/depression Has been off duloxetine Seek neuro input re: restart this or alternate SSRI in setting of CVA    ENDOCRINE DM2 Hypothyroid Continue home regimen  Glc checks ac  GENITOURINARY I reassured son there is no evidence of UTI  GASTROINTESTINAL  Swallow eval pending Poor dentition   MUSCULOSKELETAL/RHEUM PT consulted         Note: This patient has been tested and is negative for the novel coronavirus COVID-19.  DVT prophylaxis: pt on Eliquis Code Status:  Full - confirmed with patient/family Family Communication: spoke with son at bedside   Disposition Plan: SNF/rehab or Home once clinically improved Consults called: ER spoke w/ neurology  Admission status: inpatient     [facility]   How to contact the [facility] Attending or Consulting  provider [place] or covering provider during after hours [place], for this patient?  1. Check the care team in [facility] and look for a) attending/consulting [facility] provider listed and b) the [facility] team listed 2. Log into [email or web address] and use  [place]'s universal password to access. If you do not have the password, please contact the hospital operator. 3. Locate the [name] provider you are looking for under [facility] and page to a number that you can be directly reached. 4. If you still have difficulty reaching the provider, please page the [facility] (Director on Call) for the Hospitalists listed on amion for assistance.   08/22/2019, 12:32 AM

## 2019-08-22 NOTE — Progress Notes (Signed)
SLP Cancellation Note  Patient Details Name: Jaclyn [name] MRN: [id number] DOB: Jan 13, 1936   Cancelled treatment:       Reason Eval/Treat Not Completed: Patient declined, no reason specified (Pt was approached for evaluation but reported that she was tired, covered her head with the blanket and stated "I'm going to go to sleep". Pt was educated regarding the purpose of the evaluation but laughed, closed her eyes again and stated, "I'm gon' go to sleep now, honey". SLP will follow up on subsequent date for evaluation.)  Shametra Cumberland I. Hardin Negus, [place], [place] Office number [phone] Pager [place] 08/22/2019, 4:44 PM

## 2019-08-22 NOTE — Evaluation (Signed)
Physical Therapy Evaluation Patient Details Name: Jaclyn [name] MRN: [id number] DOB: [birth year] Today's Date: 08/22/2019   History of Present Illness  Patient is a 84 y/o female who presents with AMS and LUE weakness s/p fall at home. Found to have urinary frequency and hyperglycemia. 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 PMH includes COVID, CVA, dementia, HTN, hypothyroidism, recurrent UTI.  Clinical Impression  Patient presents with left hemiparesis, impaired sensation, left inattention, right gaze preference, impaired balance, impaired cognition and impaired mobility s/p above. Pt lives with son and uses [facility] for ambulation PTA. Per son, pt needs some assist at times with ADLs and able to make some easy meals in the kitchen. Today, pt requires Mod A for bed mobility, Min A for standing and Mod A for SPT to chair. Pt noted to have impaired attention, awareness, safety and impairments in problem solving. Would benefit from CIR to maximize independence and mobility prior to return home. Will follow acutely.    Follow Up Recommendations CIR;Supervision for mobility/OOB;Supervision/Assistance - 24 hour    Equipment Recommendations  Other (comment) (defer to next venue)    Recommendations for Other Services       Precautions / Restrictions Precautions Precautions: Fall Precaution Comments: left inattention Restrictions Weight Bearing Restrictions: No      Mobility  Bed Mobility Overal bed mobility: Needs Assistance Bed Mobility: Supine to Sit     Supine to sit: Min assist;HOB elevated     General bed mobility comments: manual assist to place RUE on rail, assist with trunk to get to EOB with LUE being left behind without awareness.  Transfers Overall transfer level: Needs assistance Equipment used: 1 person hand held assist Transfers: Sit to/from [employer] Sit to Stand: Min assist Stand pivot transfers: Mod assist       General transfer comment: assist to steady in standing with flexed trunk and UE support on bed; Cues for upright. performed x3 from EOB. SPT bed to chair with Mod A for sequencing, LLE advancement and balance.  Ambulation/Gait             General Gait Details: Need 2 person assist for safe ambulation attempt.  Stairs            Wheelchair Mobility    Modified Rankin (Stroke Patients Only) Modified Rankin (Stroke Patients Only) Pre-Morbid Rankin Score: Moderate disability Modified Rankin: Moderately severe disability     Balance Overall balance assessment: History of Falls;Needs assistance Sitting-balance support: Feet supported;Single extremity supported Sitting balance-Leahy Scale: Poor Sitting balance - Comments: Varying assist needed, Min guard-Mod A due to poor balance reactions and LOB in all directions.   Standing balance support: During functional activity Standing balance-Leahy Scale: Poor Standing balance comment: Requires external support for balance.                             Pertinent Vitals/Pain Pain Assessment: Faces Faces Pain Scale: No hurt    Home Living Family/patient expects to be discharged to:: Private residence Living Arrangements:  (with son) Available Help at Discharge:  (son works during the day) Type of Home: House Home Access: Level entry     Home Layout: One level Home Equipment: Cane - single point;Grab bars - tub/shower;Bedside commode      Prior Function Level of Independence: Independent with assistive device(s)         Comments: was able  to care for herself; needed help dressing at times. usually wears depends, uses a cane ; Able to make easy things in kitchen. Does not drive. Home alone during the day while son works.     Hand Dominance   Dominant Hand: Right    Extremity/Trunk Assessment   Upper Extremity  Assessment Upper Extremity Assessment: Defer to OT evaluation;LUE deficits/detail LUE Deficits / Details: No motor movement noted. Inattention present. LUE Sensation: decreased light touch;decreased proprioception    Lower Extremity Assessment Lower Extremity Assessment: LLE deficits/detail LLE Deficits / Details: Able to perform LAQ and marching with some decreased range. Difficulty lifting LLE against gravity from bed. LLE Sensation: decreased light touch;decreased proprioception       Communication   Communication: HOH  Cognition Arousal/Alertness: Awake/alert Behavior During Therapy: WFL for tasks assessed/performed Overall Cognitive Status: Impaired/Different from baseline Area of Impairment: Orientation;Attention;Memory;Following commands;Safety/judgement;Awareness;Problem solving                 Orientation Level: Disoriented to;Time;Situation Current Attention Level: Sustained Memory: Decreased short-term memory Following Commands: Follows one step commands with increased time (repetition) Safety/Judgement: Decreased awareness of deficits;Decreased awareness of safety Awareness: Intellectual Problem Solving: Difficulty sequencing;Requires verbal cues;Requires tactile cues;Slow processing General Comments: "June". Tangential at times; cues to stay attending to task and repetition to follow commands. Right gaze preference and left inattention. Possible visual field deficits?      General Comments General comments (skin integrity, edema, etc.): Son present during session and providing some of PLOF/history. Reports she acts this way when she has UTIs.    Exercises     Assessment/Plan    PT Assessment Patient needs continued PT services  PT Problem List Decreased strength;Decreased mobility;Decreased safety awareness;Impaired tone;Decreased range of motion;Decreased cognition;Impaired sensation;Decreased balance       PT Treatment Interventions Therapeutic  activities;Gait training;Therapeutic exercise;Patient/family education;Cognitive remediation;Balance training;Functional mobility training;Neuromuscular re-education    PT Goals (Current goals can be found in the Care Plan section)  Acute Rehab PT Goals Patient Stated Goal: get something to eat PT Goal Formulation: With patient Time For Goal Achievement: 09/05/19 Potential to Achieve Goals: Fair    Frequency Min 4X/week   Barriers to discharge Decreased caregiver support son works during the day    Co-evaluation               AM-PAC PT "6 Clicks" Mobility  Outcome Measure Help needed turning from your back to your side while in a flat bed without using bedrails?: A Little Help needed moving from lying on your back to sitting on the side of a flat bed without using bedrails?: A Lot Help needed moving to and from a bed to a chair (including a wheelchair)?: A Lot Help needed standing up from a chair using your arms (e.g., wheelchair or bedside chair)?: A Little Help needed to walk in hospital room?: A Lot Help needed climbing 3-5 steps with a railing? : Total 6 Click Score: 13    End of Session Equipment Utilized During Treatment: Gait belt Activity Tolerance: Patient tolerated treatment well Patient left: in chair;with call bell/phone within reach;with chair alarm set;with family/visitor present;Other (comment) (Neurologist present) Nurse Communication: Mobility status PT Visit Diagnosis: Hemiplegia and hemiparesis Hemiplegia - Right/Left: Left Hemiplegia - dominant/non-dominant: Non-dominant Hemiplegia - caused by: Cerebral infarction    Time: 1121-1150 PT Time Calculation (min) (ACUTE ONLY): 29 min   Charges:   PT Evaluation $PT Eval Moderate Complexity: 1 Mod PT Treatments $Therapeutic Activity: 8-22 mins  Zettie Cooley, DPT Acute Rehabilitation Services Pager [phone] Office [phone]      [place] 08/22/2019, 12:08 PM

## 2019-08-22 NOTE — Progress Notes (Signed)
Called to see if pt is available for EEG pt is in recliner eating lunch per RN. Will try back in 30 mins.

## 2019-08-22 NOTE — Procedures (Signed)
ELECTROENCEPHALOGRAM REPORT   Patient: Jaclyn [name]       Room #: 7K71U EEG No. ID: [id number] Age: 84 y.o.        Sex: female Requesting Physician: Florene Glen Report Date:  08/22/2019        Interpreting Physician: [name]  History: Jaclyn [name] is an 84 y.o. female with altered mental status  Medications:  Eliquis, Lipitor, Insulin, Synthroid, Tradjenta  Conditions of Recording:  This is a 21 channel routine scalp EEG performed with bipolar and monopolar montages arranged in accordance to the international 10/20 system of electrode placement. One channel was dedicated to EKG recording.  The patient is in the awake and drowsy states.  Description:  The patient is uncooperative and muscle and movement artifact are prominent during the recording.  When a waking background activity can be evaluated it consists of a low voltage, symmetrical, fairly well organized, 8 Hz alpha activity, seen from the parieto-occipital and posterior temporal regions.  Low voltage fast activity, poorly organized, is seen anteriorly and is at times superimposed on more posterior regions.  A mixture of theta and alpha rhythms are seen from the central and temporal regions. The patient drowses with slowing to irregular, low voltage theta and beta activity.   Stage II sleep is not obtained. No epileptiform activity is noted.   Hyperventilation and intermittent photic stimulation were not performed.   IMPRESSION: Normal electroencephalogram, awake and drowsy. There are no focal lateralizing or epileptiform features.   [name], MD Neurology [phone] 08/22/2019, 3:17 PM

## 2019-08-23 ENCOUNTER — Ambulatory Visit: Payer: Medicare Other | Admitting: Family Medicine

## 2019-08-23 ENCOUNTER — Inpatient Hospital Stay (HOSPITAL_COMMUNITY): Payer: Medicare Other

## 2019-08-23 DIAGNOSIS — Z01810 Encounter for preprocedural cardiovascular examination: Secondary | ICD-10-CM

## 2019-08-23 DIAGNOSIS — I480 Paroxysmal atrial fibrillation: Secondary | ICD-10-CM

## 2019-08-23 DIAGNOSIS — I639 Cerebral infarction, unspecified: Secondary | ICD-10-CM

## 2019-08-23 DIAGNOSIS — I10 Essential (primary) hypertension: Secondary | ICD-10-CM

## 2019-08-23 DIAGNOSIS — Z7901 Long term (current) use of anticoagulants: Secondary | ICD-10-CM

## 2019-08-23 DIAGNOSIS — E119 Type 2 diabetes mellitus without complications: Secondary | ICD-10-CM

## 2019-08-23 DIAGNOSIS — I6521 Occlusion and stenosis of right carotid artery: Secondary | ICD-10-CM

## 2019-08-23 DIAGNOSIS — I482 Chronic atrial fibrillation, unspecified: Secondary | ICD-10-CM

## 2019-08-23 LAB — GLUCOSE, CAPILLARY
Glucose-Capillary: 215 mg/dL — ABNORMAL HIGH (ref 70–99)
Glucose-Capillary: 330 mg/dL — ABNORMAL HIGH (ref 70–99)
Glucose-Capillary: 282 mg/dL — ABNORMAL HIGH (ref 70–99)
Glucose-Capillary: 213 mg/dL — ABNORMAL HIGH (ref 70–99)

## 2019-08-23 LAB — CBC WITH DIFFERENTIAL/PLATELET
Abs Immature Granulocytes: 0.02 10*3/uL (ref 0.00–0.07)
Basophils Absolute: 0 10*3/uL (ref 0.0–0.1)
Basophils Relative: 0 %
Eosinophils Absolute: 0.1 10*3/uL (ref 0.0–0.5)
Eosinophils Relative: 2 %
HCT: 36.2 % (ref 36.0–46.0)
Hemoglobin: 12 g/dL (ref 12.0–15.0)
Immature Granulocytes: 0 %
Lymphocytes Relative: 23 %
Lymphs Abs: 1.3 10*3/uL (ref 0.7–4.0)
MCH: 29.2 pg (ref 26.0–34.0)
MCHC: 33.1 g/dL (ref 30.0–36.0)
MCV: 88.1 fL (ref 80.0–100.0)
Monocytes Absolute: 0.6 10*3/uL (ref 0.1–1.0)
Monocytes Relative: 11 %
Neutro Abs: 3.5 10*3/uL (ref 1.7–7.7)
Neutrophils Relative %: 64 %
Platelets: 204 10*3/uL (ref 150–400)
RBC: 4.11 MIL/uL (ref 3.87–5.11)
RDW: 14.6 % (ref 11.5–15.5)
WBC: 5.5 10*3/uL (ref 4.0–10.5)
nRBC: 0 % (ref 0.0–0.2)

## 2019-08-23 LAB — COMPREHENSIVE METABOLIC PANEL
ALT: 16 U/L (ref 0–44)
AST: 20 U/L (ref 15–41)
Albumin: 3.1 g/dL — ABNORMAL LOW (ref 3.5–5.0)
Alkaline Phosphatase: 57 U/L (ref 38–126)
Anion gap: 11 (ref 5–15)
BUN: 5 mg/dL — ABNORMAL LOW (ref 8–23)
CO2: 23 mmol/L (ref 22–32)
Calcium: 8.8 mg/dL — ABNORMAL LOW (ref 8.9–10.3)
Chloride: 108 mmol/L (ref 98–111)
Creatinine, Ser: 0.61 mg/dL (ref 0.44–1.00)
GFR calc Af Amer: >60 mL/min (ref >60)
GFR calc non Af Amer: >60 mL/min (ref >60)
Glucose, Bld: 165 mg/dL — ABNORMAL HIGH (ref 70–99)
Potassium: 3 mmol/L — ABNORMAL LOW (ref 3.5–5.1)
Sodium: 142 mmol/L (ref 135–145)
Total Bilirubin: 1 mg/dL (ref 0.3–1.2)
Total Protein: 6.1 g/dL — ABNORMAL LOW (ref 6.5–8.1)

## 2019-08-23 LAB — URINE CULTURE: Culture: 30000 — AB

## 2019-08-23 LAB — MAGNESIUM: Magnesium: 1.4 mg/dL — ABNORMAL LOW (ref 1.7–2.4)

## 2019-08-23 LAB — PHOSPHORUS: Phosphorus: 3.8 mg/dL (ref 2.5–4.6)

## 2019-08-23 LAB — RPR: RPR Ser Ql: NONREACTIVE

## 2019-08-23 MED ORDER — HEPARIN (PORCINE) 25000 UT/250ML-% IV SOLN
900.0000 [IU]/h | INTRAVENOUS | Status: DC
  Administered 2019-08-23 – 2019-08-25 (×2): 900 [IU]/h via INTRAVENOUS
  Filled 2019-08-23 (×2): qty 250

## 2019-08-23 MED ORDER — ATORVASTATIN CALCIUM 40 MG PO TABS
40.0000 mg | ORAL_TABLET | Freq: Every day | ORAL | Status: DC
  Administered 2019-08-24 – 2019-08-31 (×7): 40 mg via ORAL
  Filled 2019-08-23 (×7): qty 1

## 2019-08-23 MED ORDER — MAGNESIUM SULFATE 2 GM/50ML IV SOLN
2.0000 g | Freq: Once | INTRAVENOUS | Status: AC
  Administered 2019-08-23: 2 g via INTRAVENOUS
  Filled 2019-08-23: qty 50

## 2019-08-23 MED ORDER — POTASSIUM CHLORIDE CRYS ER 20 MEQ PO TBCR
40.0000 meq | EXTENDED_RELEASE_TABLET | ORAL | Status: AC
  Administered 2019-08-23 (×2): 40 meq via ORAL
  Filled 2019-08-23 (×2): qty 2

## 2019-08-23 MED ORDER — METOPROLOL TARTRATE 50 MG PO TABS
50.0000 mg | ORAL_TABLET | Freq: Two times a day (BID) | ORAL | Status: AC
  Administered 2019-08-24: 50 mg via ORAL
  Filled 2019-08-23: qty 1

## 2019-08-23 MED ORDER — ASPIRIN EC 81 MG PO TBEC
81.0000 mg | DELAYED_RELEASE_TABLET | Freq: Every day | ORAL | Status: DC
  Administered 2019-08-23 – 2019-08-31 (×8): 81 mg via ORAL
  Filled 2019-08-23 (×8): qty 1

## 2019-08-23 MED ORDER — INSULIN ASPART 100 UNIT/ML ~~LOC~~ SOLN
4.0000 [IU] | Freq: Three times a day (TID) | SUBCUTANEOUS | Status: DC
  Administered 2019-08-23 – 2019-08-27 (×8): 4 [IU] via SUBCUTANEOUS

## 2019-08-23 NOTE — Consult Note (Signed)
Physical Medicine and Rehabilitation Consult Reason for Consult: Left side weakness Referring Physician: [facility]   HPI: Jaclyn [name] is a 84 y.o. right-handed female with history of atrial fibrillation maintained on Eliquis, bilateral ICA stenosis, recurrent UTIs, fibromyalgia, diabetes mellitus, hyperlipidemia, multiple TIAs without residual deficits.  Per chart review patient lives with son.  1 level home.  Son works during the day.  Independent with assistive device.  She was able to care for herself needing only limited assistance for dressing at times.  She does not drive.  Presented 08/21/2018 while left-sided weakness and altered mental status.  She was found down by her son.  Cranial CT scan negative.  Patient did not receive TPA.  MRI showed a 6 mm acute ischemic nonhemorrhagic periventricular white matter infarction involving the posterior right frontal corona radiata centrum semiovale.  CT angiogram of head and neck negative for large vessel occlusion.  Echocardiogram with ejection fraction of 70% no wall motion abnormalities.  EEG negative.  Admission chemistries with sodium 132, glucose 360, hemoglobin 11.4, urinalysis negative nitrite, hemoglobin A1c 14.2.  Neurology follow-up patient currently remains on Eliquis as prior to admission.  Tolerating a regular diet.  Therapy evaluations completed with recommendations of physical medicine rehab consult.   Review of Systems  Constitutional: Negative for chills and fever.  HENT: Negative for hearing loss.   Eyes: Negative for blurred vision and double vision.  Respiratory: Negative for cough and shortness of breath.   Cardiovascular: Positive for palpitations and leg swelling. Negative for chest pain.  Gastrointestinal: Positive for constipation. Negative for heartburn, nausea and vomiting.  Genitourinary: Positive for urgency. Negative for dysuria, flank pain and hematuria.  Musculoskeletal: Positive for joint pain and myalgias.    Skin: Negative for rash.  Neurological: Positive for speech change, weakness and headaches.  Psychiatric/Behavioral: The patient has insomnia.   All other systems reviewed and are negative.  Past Medical History:  Diagnosis Date  . Atrial fibrillation ([place])   . Benign neoplasm of colon   . Carotid artery occlusion    60-79% right ICA stenosis  . Carotid bruit   . Cerebrovascular disease, unspecified   . Difficult intubation 2008   During surgery to remove large polyp  . Diverticulosis of colon (without mention of hemorrhage)   . Dysthymic disorder   . Fibromyalgia   . Headache(784.0)   . Insomnia, unspecified   . Interstitial cystitis   . Myalgia and myositis, unspecified   . Osteoarthrosis, unspecified whether generalized or localized, unspecified site   . Other and unspecified hyperlipidemia   . Other chest pain   . Other specified benign mammary dysplasias   . TIA (transient ischemic attack)   . Type II or unspecified type diabetes mellitus without mention of complication, not stated as uncontrolled   . Unspecified essential hypertension   . Unspecified hypothyroidism   . Unspecified vitamin D deficiency   . Urinary tract infection, site not specified    Past Surgical History:  Procedure Laterality Date  . ABDOMINAL HYSTERECTOMY  1988   cervical dysplasia  . CARDIAC CATHETERIZATION  02/19/06   EF 60%  . COLONOSCOPY    . RIGHT COLECTOMY  2005   for villous adenoma of the cecum Dr.streck  . VESICOVAGINAL FISTULA CLOSURE W/ TAH  1990   w/ cystocele &retocele repairs Dr. Ree Edman   Family History  Problem Relation Age of Onset  . Lymphoma Father   . Hypertension Father   . Stroke Father   .  Hyperlipidemia Father   . Hypertension Mother   . Allergies Brother   . Hypertension Sister        3  . Breast cancer Sister   . Fibromyalgia Sister        3  . Hyperlipidemia Sister        3   Social History:  reports that she has never smoked. She has never used  smokeless tobacco. She reports that she does not drink alcohol and does not use drugs. Allergies:  Allergies  Allergen Reactions  . Naproxen Sodium Other (See Comments)    Fever/aches and pains  . Statins Other (See Comments)    myalgias  . Sulfonamide Derivatives Hives and Itching  . Latex Itching and Rash  . Shellfish Allergy Itching, Swelling and Rash    Seafood, shrimp   Medications Prior to Admission  Medication Sig Dispense Refill  . apixaban (ELIQUIS) 5 MG TABS tablet Take 1 tablet (5 mg total) by mouth 2 (two) times daily. 180 tablet 1  . Insulin Glargine (LANTUS SOLOSTAR) 100 UNIT/ML Solostar Pen Inject 65 Units into the skin daily. 15 mL 11  . levothyroxine (SYNTHROID) 112 MCG tablet Take 1 tablet (112 mcg total) by mouth daily. 90 tablet 3  . sitaGLIPtin (JANUVIA) 100 MG tablet Take 1 tablet (100 mg total) by mouth daily. 30 tablet 11  . trimethoprim (TRIMPEX) 100 MG tablet Take 1 tablet (100 mg total) by mouth daily. 90 tablet 3  . B-D ULTRAFINE III SHORT PEN 31G X 8 MM MISC USE TO INJECT INSULIN ONCE DAILY. DX: E11.65 100 each 3  . DULoxetine (CYMBALTA) 30 MG capsule Take 2 capsules (60 mg total) by mouth daily. (Patient not taking: Reported on 08/21/2019) 180 capsule 1  . vitamin B-12 (CYANOCOBALAMIN) 1000 MCG tablet Take 1 tablet (1,000 mcg total) by mouth daily. 30 tablet 11  . Vitamin D, Ergocalciferol, (DRISDOL) 1.25 MG (50000 UT) CAPS capsule Take 1 capsule (50,000 Units total) by mouth every 7 (seven) days. 12 capsule 0    Home: Home Living Family/patient expects to be discharged to:: Private residence Living Arrangements:  (with son) Available Help at Discharge:  (son works during the day) Type of Home: House Home Access: Level entry [place]: One level Bathroom Shower/Tub: Public librarian, Architectural technologist: Standard Home Equipment: Radio producer - single point, Grab bars - tub/shower, Bedside commode  Lives With: Son  Functional History: Prior  Function Level of Independence: Independent with assistive device(s) Comments: was able to care for herself; needed help dressing at times. usually wears depends, uses a cane ; Able to make easy things in kitchen. Does not drive. Home alone during the day while son works. Functional Status:  Mobility: Bed Mobility Overal bed mobility: Needs Assistance Bed Mobility: Supine to Sit Supine to sit: Min assist, HOB elevated General bed mobility comments: manual assist to place RUE on rail, assist with trunk to get to EOB with LUE being left behind without awareness. Transfers Overall transfer level: Needs assistance Equipment used: 1 person hand held assist Transfers: Sit to/from Stand, Stand Pivot Transfers Sit to Stand: Min assist Stand pivot transfers: Mod assist General transfer comment: assist to steady in standing with flexed trunk and UE support on bed; Cues for upright. performed x3 from EOB. SPT bed to chair with Mod A for sequencing, LLE advancement and balance. Ambulation/Gait General Gait Details: Need 2 person assist for safe ambulation attempt.    ADL:    Cognition: Cognition Overall Cognitive Status:  Impaired/Different from baseline Orientation Level: Oriented to person, Oriented to place, Oriented to time, Disoriented to situation Cognition Arousal/Alertness: Awake/alert Behavior During Therapy: WFL for tasks assessed/performed Overall Cognitive Status: Impaired/Different from baseline Area of Impairment: Orientation, Attention, Memory, Following commands, Safety/judgement, Awareness, Problem solving Orientation Level: Disoriented to, Time, Situation Current Attention Level: Sustained Memory: Decreased short-term memory Following Commands: Follows one step commands with increased time (repetition) Safety/Judgement: Decreased awareness of deficits, Decreased awareness of safety Awareness: Intellectual Problem Solving: Difficulty sequencing, Requires verbal cues,  Requires tactile cues, Slow processing General Comments: "June". Tangential at times; cues to stay attending to task and repetition to follow commands. Right gaze preference and left inattention. Possible visual field deficits?  Blood pressure (!) 132/57, pulse 68, temperature 97.7 F (36.5 C), temperature source Oral, resp. rate 17, height 5\' 2"  (1.575 m), weight 66 kg, SpO2 94 %.  Physical Exam General: Alert and oriented x 3, No apparent distress HEENT: Head is normocephalic, atraumatic, PERRLA, EOMI, sclera anicteric, oral mucosa pink and moist, poor dentition, ext ear canals clear,  Neck: Supple without JVD or lymphadenopathy Heart: Reg rate and rhythm. No murmurs rubs or gallops Chest: CTA bilaterally without wheezes, rales, or rhonchi; no distress Abdomen: Soft, non-tender, non-distended, bowel sounds positive. Extremities: No clubbing, cyanosis, or edema. Pulses are 2+ Skin: Clean and intact without signs of breakdown Neuro: Patient is alert in no acute distress.  Right gaze preference and left sided neglect. Follows simple commands.  Provides name and age. Clear speech.  Limited medical historian.  LUE: 4/5 strength throughout. Otherwise 5/5 strength.  Psych: Pt's affect is friendly. Verbose. Tangential.    Results for orders placed or performed during the hospital encounter of 08/21/19 (from the past 24 hour(s))  Glucose, capillary     Status: Abnormal   Collection Time: 08/22/19  6:27 AM  Result Value Ref Range   Glucose-Capillary 353 (H) 70 - 99 mg/dL  CBC with Differential/Platelet     Status: None   Collection Time: 08/22/19 11:37 AM  Result Value Ref Range   WBC 7.2 4.0 - 10.5 K/uL   RBC 4.05 3.87 - 5.11 MIL/uL   Hemoglobin 12.2 12.0 - 15.0 g/dL   HCT 36.0 36 - 46 %   MCV 88.9 80.0 - 100.0 fL   MCH 30.1 26.0 - 34.0 pg   MCHC 33.9 30.0 - 36.0 g/dL   RDW 14.5 11.5 - 15.5 %   Platelets 217 150 - 400 K/uL   nRBC 0.0 0.0 - 0.2 %   Neutrophils Relative % 64 %   Neutro  Abs 4.6 1.7 - 7.7 K/uL   Lymphocytes Relative 26 %   Lymphs Abs 1.9 0.7 - 4.0 K/uL   Monocytes Relative 9 %   Monocytes Absolute 0.7 0 - 1 K/uL   Eosinophils Relative 1 %   Eosinophils Absolute 0.1 0 - 0 K/uL   Basophils Relative 0 %   Basophils Absolute 0.0 0 - 0 K/uL   Immature Granulocytes 0 %   Abs Immature Granulocytes 0.02 0.00 - 0.07 K/uL  Comprehensive metabolic panel     Status: Abnormal   Collection Time: 08/22/19 11:37 AM  Result Value Ref Range   Sodium 139 135 - 145 mmol/L   Potassium 3.6 3.5 - 5.1 mmol/L   Chloride 107 98 - 111 mmol/L   CO2 21 (L) 22 - 32 mmol/L   Glucose, Bld 127 (H) 70 - 99 mg/dL   BUN 6 (L) 8 - 23 mg/dL  Creatinine, Ser 0.65 0.44 - 1.00 mg/dL   Calcium 8.7 (L) 8.9 - 10.3 mg/dL   Total Protein 6.4 (L) 6.5 - 8.1 g/dL   Albumin 3.4 (L) 3.5 - 5.0 g/dL   AST 19 15 - 41 U/L   ALT 16 0 - 44 U/L   Alkaline Phosphatase 60 38 - 126 U/L   Total Bilirubin 0.6 0.3 - 1.2 mg/dL   GFR calc non Af Amer >60 >60 mL/min   GFR calc Af Amer >60 >60 mL/min   Anion gap 11 5 - 15  Vitamin B12     Status: None   Collection Time: 08/22/19 11:37 AM  Result Value Ref Range   Vitamin B-12 733 180 - 914 pg/mL  TSH     Status: None   Collection Time: 08/22/19 11:37 AM  Result Value Ref Range   TSH 2.876 0.350 - 4.500 uIU/mL  Glucose, capillary     Status: Abnormal   Collection Time: 08/22/19 12:34 PM  Result Value Ref Range   Glucose-Capillary 136 (H) 70 - 99 mg/dL  Glucose, capillary     Status: Abnormal   Collection Time: 08/22/19  3:44 PM  Result Value Ref Range   Glucose-Capillary 165 (H) 70 - 99 mg/dL   Comment 1 Notify RN    Comment 2 Document in Chart   Ammonia     Status: None   Collection Time: 08/22/19  4:29 PM  Result Value Ref Range   Ammonia 26 9 - 35 umol/L  Glucose, capillary     Status: Abnormal   Collection Time: 08/22/19  9:38 PM  Result Value Ref Range   Glucose-Capillary 106 (H) 70 - 99 mg/dL   Comment 1 Notify RN    Comment 2 Document  in Chart   CBC with Differential/Platelet     Status: None   Collection Time: 08/23/19  4:25 AM  Result Value Ref Range   WBC 5.5 4.0 - 10.5 K/uL   RBC 4.11 3.87 - 5.11 MIL/uL   Hemoglobin 12.0 12.0 - 15.0 g/dL   HCT 36.2 36 - 46 %   MCV 88.1 80.0 - 100.0 fL   MCH 29.2 26.0 - 34.0 pg   MCHC 33.1 30.0 - 36.0 g/dL   RDW 14.6 11.5 - 15.5 %   Platelets 204 150 - 400 K/uL   nRBC 0.0 0.0 - 0.2 %   Neutrophils Relative % 64 %   Neutro Abs 3.5 1.7 - 7.7 K/uL   Lymphocytes Relative 23 %   Lymphs Abs 1.3 0.7 - 4.0 K/uL   Monocytes Relative 11 %   Monocytes Absolute 0.6 0 - 1 K/uL   Eosinophils Relative 2 %   Eosinophils Absolute 0.1 0 - 0 K/uL   Basophils Relative 0 %   Basophils Absolute 0.0 0 - 0 K/uL   Immature Granulocytes 0 %   Abs Immature Granulocytes 0.02 0.00 - 0.07 K/uL  Comprehensive metabolic panel     Status: Abnormal   Collection Time: 08/23/19  4:25 AM  Result Value Ref Range   Sodium 142 135 - 145 mmol/L   Potassium 3.0 (L) 3.5 - 5.1 mmol/L   Chloride 108 98 - 111 mmol/L   CO2 23 22 - 32 mmol/L   Glucose, Bld 165 (H) 70 - 99 mg/dL   BUN 5 (L) 8 - 23 mg/dL   Creatinine, Ser 0.61 0.44 - 1.00 mg/dL   Calcium 8.8 (L) 8.9 - 10.3 mg/dL   Total Protein 6.1 (L)  6.5 - 8.1 g/dL   Albumin 3.1 (L) 3.5 - 5.0 g/dL   AST 20 15 - 41 U/L   ALT 16 0 - 44 U/L   Alkaline Phosphatase 57 38 - 126 U/L   Total Bilirubin 1.0 0.3 - 1.2 mg/dL   GFR calc non Af Amer >60 >60 mL/min   GFR calc Af Amer >60 >60 mL/min   Anion gap 11 5 - 15  Magnesium     Status: Abnormal   Collection Time: 08/23/19  4:25 AM  Result Value Ref Range   Magnesium 1.4 (L) 1.7 - 2.4 mg/dL  Phosphorus     Status: None   Collection Time: 08/23/19  4:25 AM  Result Value Ref Range   Phosphorus 3.8 2.5 - 4.6 mg/dL   CT Head Wo Contrast  Result Date: 08/21/2019 CLINICAL DATA:  Neuro deficits. Subacute arm weakness. Increasingly confused since Friday. EXAM: CT HEAD WITHOUT CONTRAST TECHNIQUE: Contiguous axial images  were obtained from the base of the skull through the vertex without intravenous contrast. COMPARISON:  01/24/2019 FINDINGS: Brain: Moderate central and cortical atrophy. Periventricular white matter changes are consistent with small vessel disease. Vascular: Dense atherosclerotic calcification of the internal carotid arteries. No hyperdense vessels. Skull: Normal. Negative for fracture or focal lesion. Sinuses/Orbits: No acute finding. Other: None. IMPRESSION: 1. No evidence for acute intracranial abnormality. 2. Atrophy and small vessel disease. Electronically Signed   By: Nolon Nations M.D.   On: 08/21/2019 20:58   MR BRAIN WO CONTRAST  Result Date: 08/21/2019 CLINICAL DATA:  Initial evaluation for increased confusion, focal neural deficit. EXAM: MRI HEAD WITHOUT CONTRAST TECHNIQUE: Multiplanar, multiecho pulse sequences of the brain and surrounding structures were obtained without intravenous contrast. COMPARISON:  Prior head CT from earlier the same day. FINDINGS: Brain: Examination mildly degraded by motion artifact. Generalized age-related cerebral atrophy. Patchy and confluent T2/FLAIR hyperintensity within the periventricular and deep white matter of both cerebral hemispheres most consistent with chronic small vessel ischemic disease, moderate in nature. Patchy involvement of the pons noted. 6 mm acute ischemic infarcts seen involving the periventricular white matter of the posterior right frontal corona radiata/centrum semi ovale (series 5, image 83). No associated hemorrhage or mass effect. No other evidence for acute or subacute ischemia. Gray-white matter differentiation otherwise maintained. No encephalomalacia to suggest chronic cortical infarction. No acute intracranial hemorrhage. Single chronic microhemorrhage noted at the inferior cerebellum, likely hypertensive in nature. No other evidence for chronic intracranial hemorrhage. No mass lesion, midline shift or mass effect. Diffuse ventricular  prominence most likely related to global parenchymal volume loss without hydrocephalus. No extra-axial fluid collection. Pituitary gland suprasellar region within normal limits. Midline structures intact. Vascular: Major intracranial vascular flow voids are maintained. Skull and upper cervical spine: Craniocervical junction within normal limits. Bone marrow signal intensity normal. No scalp soft tissue abnormality. Sinuses/Orbits: Patient status post ocular lens replacement on the left. Globes and orbital soft tissues demonstrate no acute finding. Mild scattered mucosal thickening noted throughout the ethmoidal air cells and maxillary sinuses. Trace bilateral mastoid effusions noted, of doubtful significance. Visualized nasopharynx within normal limits. Other: None. IMPRESSION: 1. 6 mm acute ischemic nonhemorrhagic periventricular white matter infarct involving the posterior right frontal corona radiata/centrum semi ovale. 2. No other acute intracranial abnormality. 3. Age-related cerebral atrophy with moderate chronic microvascular ischemic disease. Electronically Signed   By: Jeannine Boga M.D.   On: 08/21/2019 23:44   EEG adult  Result Date: 08/22/2019 [name], MD     08/22/2019  3:21 PM ELECTROENCEPHALOGRAM REPORT Patient: Ishana Blades       Room #: 3W34C EEG No. ID: [id number] Age: 84 y.o.        Sex: female Requesting Physician: Florene Glen Report Date:  08/22/2019       Interpreting Physician: [name] History: Lucianna Ostlund is an 84 y.o. female with altered mental status Medications: Eliquis, Lipitor, Insulin, Synthroid, Tradjenta Conditions of Recording:  This is a 21 channel routine scalp EEG performed with bipolar and monopolar montages arranged in accordance to the international 10/20 system of electrode placement. One channel was dedicated to EKG recording. The patient is in the awake and drowsy states. Description:  The patient is uncooperative and muscle and movement artifact are  prominent during the recording.  When a waking background activity can be evaluated it consists of a low voltage, symmetrical, fairly well organized, 8 Hz alpha activity, seen from the parieto-occipital and posterior temporal regions.  Low voltage fast activity, poorly organized, is seen anteriorly and is at times superimposed on more posterior regions.  A mixture of theta and alpha rhythms are seen from the central and temporal regions. The patient drowses with slowing to irregular, low voltage theta and beta activity.  Stage II sleep is not obtained. No epileptiform activity is noted.  Hyperventilation and intermittent photic stimulation were not performed. IMPRESSION: Normal electroencephalogram, awake and drowsy. There are no focal lateralizing or epileptiform features. [name], MD Neurology [phone] 08/22/2019, 3:17 PM   ECHOCARDIOGRAM COMPLETE  Result Date: 08/22/2019    ECHOCARDIOGRAM REPORT   Patient Name:   LOLETTA HARPER Date of Exam: 08/22/2019 Medical Rec #:  [id number]       Height:       62.0 in Accession #:    [id number]      Weight:       145.5 lb Date of Birth:  [birth year]       BSA:          1.670 m Patient Age:    69 years        BP:           127/66 mmHg Patient Gender: F               HR:           70 bpm. Exam Location:  Inpatient Procedure: 2D Echo, Color Doppler and Cardiac Doppler Indications:    Stroke i163.9  History:        Patient has prior history of Echocardiogram examinations, most                 recent 06/21/2017. Arrythmias:Atrial Fibrillation; Risk                 Factors:Hypertension, Diabetes and Dyslipidemia. COVID+ on                 01/15/19.  Sonographer:    [name] Senior RDCS Referring Phys: [id number] [place]  1. Left ventricular ejection fraction, by estimation, is 65 to 70%. The left ventricle has normal function. The left ventricle has no regional wall motion abnormalities. There is moderate left ventricular hypertrophy. Left ventricular  diastolic parameters are indeterminate.  2. Right ventricular systolic function is normal. The right ventricular size is normal. Mildly increased right ventricular wall thickness. Tricuspid regurgitation signal is inadequate for assessing PA pressure.  3. The mitral valve is grossly normal, mild annular calcification. Trivial mitral valve regurgitation.  4. The aortic valve is tricuspid, mild annular calcification. Aortic  valve regurgitation is not visualized.  5. The inferior vena cava is normal in size with greater than 50% respiratory variability, suggesting right atrial pressure of 3 mmHg. FINDINGS  Left Ventricle: Left ventricular ejection fraction, by estimation, is 65 to 70%. The left ventricle has normal function. The left ventricle has no regional wall motion abnormalities. The left ventricular internal cavity size was small. There is moderate  left ventricular hypertrophy. Left ventricular diastolic parameters are indeterminate. Right Ventricle: The right ventricular size is normal. Mildly increased right ventricular wall thickness. Right ventricular systolic function is normal. Tricuspid regurgitation signal is inadequate for assessing PA pressure. Left Atrium: Left atrial size was normal in size. Right Atrium: Right atrial size was normal in size. Pericardium: Trivial pericardial effusion is present. The pericardial effusion is posterior to the left ventricle. Presence of pericardial fat pad. Mitral Valve: The mitral valve is grossly normal. Mild mitral annular calcification. Trivial mitral valve regurgitation. Tricuspid Valve: The tricuspid valve is grossly normal. Tricuspid valve regurgitation is trivial. Aortic Valve: The aortic valve is tricuspid. Aortic valve regurgitation is not visualized. Mild aortic valve annular calcification. Pulmonic Valve: The pulmonic valve was grossly normal. Pulmonic valve regurgitation is trivial. Aorta: The aortic root is normal in size and structure. Venous: The  inferior vena cava is normal in size with greater than 50% respiratory variability, suggesting right atrial pressure of 3 mmHg. IAS/Shunts: No atrial level shunt detected by color flow Doppler.  LEFT VENTRICLE PLAX 2D LVIDd:         2.60 cm  Diastology LVIDs:         1.60 cm  LV e' lateral:   4.90 cm/s LV PW:         1.50 cm  LV E/e' lateral: 15.6 LV IVS:        1.30 cm  LV e' medial:    5.66 cm/s LVOT diam:     1.60 cm  LV E/e' medial:  13.5 LV SV:         36 LV SV Index:   22 LVOT Area:     2.01 cm  RIGHT VENTRICLE RV S prime:     12.00 cm/s TAPSE (M-mode): 1.7 cm LEFT ATRIUM             Index       RIGHT ATRIUM           Index LA diam:        1.70 cm 1.02 cm/m  RA Area:     10.30 cm LA Vol (A2C):   28.0 ml 16.77 ml/m RA Volume:   22.40 ml  13.41 ml/m LA Vol (A4C):   27.4 ml 16.41 ml/m LA Biplane Vol: 28.5 ml 17.07 ml/m  AORTIC VALVE LVOT Vmax:   76.30 cm/s LVOT Vmean:  54.100 cm/s LVOT VTI:    0.179 m  AORTA Ao Root diam: 3.10 cm Ao Asc diam:  3.00 cm MITRAL VALVE MV Area (PHT): 1.91 cm    SHUNTS MV Decel Time: 398 msec    Systemic VTI:  0.18 m MV E velocity: 76.40 cm/s  Systemic Diam: 1.60 cm MV A velocity: 90.00 cm/s MV E/A ratio:  0.85 Rozann Lesches MD Electronically signed by Rozann Lesches MD Signature Date/Time: 08/22/2019/11:43:40 AM    Final    CT ANGIO HEAD CODE STROKE  Result Date: 08/22/2019 CLINICAL DATA:  84 year old female with a small acute right superior frontal white matter lacunar infarct after presenting with arm weakness and confusion. EXAM: CT ANGIOGRAPHY  HEAD AND NECK TECHNIQUE: Multidetector CT imaging of the head and neck was performed using the standard protocol during bolus administration of intravenous contrast. Multiplanar CT image reconstructions and MIPs were obtained to evaluate the vascular anatomy. Carotid stenosis measurements (when applicable) are obtained utilizing NASCET criteria, using the distal internal carotid diameter as the denominator. CONTRAST:  91mL  OMNIPAQUE IOHEXOL 350 MG/ML SOLN COMPARISON:  Brain MRI and plain head CT 08/21/2019. CTA head and neck and CTP 06/20/2017. FINDINGS: CTA NECK Skeleton: Carious dentition. Chronic cervical spine degeneration. No acute osseous abnormality identified. Upper chest: Chronic apical lung scarring is stable. No superior mediastinal lymphadenopathy. Unchanged borderline to mild circumferential wall thickening of the upper thoracic esophagus. Other neck: Negative. Aortic arch: 3 vessel arch configuration. Stable mild distal arch calcified plaque. Right carotid system: Negative brachiocephalic artery and proximal right CCA. Mild plaque proximal to the bifurcation without stenosis. Bulky chronic mostly calcified plaque at the right ICA origin and bulb resulting in radiographic string sign stenosis (series 5, image 94) over the initial 10 mm segment of the vessel. This appears mildly progressed. The right ICA remains patent and is otherwise negative to the skull base. Left carotid system: Minimal plaque at the left CCA origin without stenosis. Mild plaque proximal to the bifurcation without stenosis. Chronic bulky calcified plaque at the left ICA origin resulting in stenosis numerically estimated at 70% with respect to the distal vessel (series 9, image 126). This is stable. Left ICA is tortuous and remains patent distal to the stenosis. Vertebral arteries: Calcified proximal right subclavian artery plaque without stenosis. Dominant right vertebral artery is mildly ectatic without plaque or stenosis to the skull base. Moderate calcified plaque in the proximal left subclavian artery with up to 50% stenosis appears stable. Mild calcified plaque at the non dominant left vertebral artery origin not resulting in stenosis. The left vertebral remains non dominant to the skull base without stenosis. CTA HEAD Posterior circulation: Dominant right vertebral artery primarily supplies the basilar with minimal calcified plaque and no  stenosis. Normal right PICA origin. Left V4 segment is non dominant with new severe stenosis or short segment occlusion just proximal to the vertebrobasilar junction on series 11, image 20. This is about 3 mm in length. The basilar artery is patent but newly diminutive since 2019 with multifocal irregularity. Only mild focal basilar artery stenosis results. The SCA and PCA origins remain patent. Chronic bilateral PCA atherosclerosis has progressed now with severe bilateral P1 and P2 segment stenoses (series 11, images 17 and 18). Despite this there is distal bilateral PCA enhancement. Anterior circulation: Both ICA siphons are patent but heavily calcified. On the left there is severe supraclinoid stenosis on series 8, image 63, mildly progressed since 2019. On the right there is moderate to severe cavernous and proximal supraclinoid stenosis due to bulky calcified plaque which appears stable. Carotid termini remain patent. Mild-to-moderate bilateral chronic proximal MCA stenosis has not significantly changed. A1 segments remain normal. Mild bilateral ACA branch irregularity is stable. Distal right M1 stenosis is moderate and increased (series 10, image 16). Left MCA bifurcation remains patent, with stable left MCA branches demonstrating multifocal mild irregularity. Right MCA bifurcation remains patent with stable right MCA branches demonstrating multifocal mild irregularity. Venous sinuses: Patent. Anatomic variants: Dominant right vertebral artery. Review of the MIP images confirms the above findings IMPRESSION: 1. Negative for large vessel occlusion. 2. But positive for chronic severe atherosclerosis which has progressed since a 2019 CTA as follows: - New Severe stenosis or  Short Segment Occlusion of the distal Left Vertebral Artery V4 segment. - New diminutive caliber of the Basilar Artery which remains patent. - RADIOGRAPHIC STRING SIGN stenosis of the proximal Right ICA has progressed. Chronic moderate to  severe Left ICA siphon stenosis is stable. - progressed Left ICA supraclinoid stenosis which is now moderate to severe. Stable proximal left Left 70% stenosis. - progressed and Severe bilateral PCA P1/P2 stenoses. - progressed and moderate Right MCA distal M1 stenosis. 3. Stable moderate bilateral proximal MCA stenoses. Stable proximal left subclavian artery plaque with 50% stenosis. Electronically Signed   By: Genevie Ann M.D.   On: 08/22/2019 11:59   CT ANGIO NECK CODE STROKE  Result Date: 08/22/2019 CLINICAL DATA:  84 year old female with a small acute right superior frontal white matter lacunar infarct after presenting with arm weakness and confusion. EXAM: CT ANGIOGRAPHY HEAD AND NECK TECHNIQUE: Multidetector CT imaging of the head and neck was performed using the standard protocol during bolus administration of intravenous contrast. Multiplanar CT image reconstructions and MIPs were obtained to evaluate the vascular anatomy. Carotid stenosis measurements (when applicable) are obtained utilizing NASCET criteria, using the distal internal carotid diameter as the denominator. CONTRAST:  11mL OMNIPAQUE IOHEXOL 350 MG/ML SOLN COMPARISON:  Brain MRI and plain head CT 08/21/2019. CTA head and neck and CTP 06/20/2017. FINDINGS: CTA NECK Skeleton: Carious dentition. Chronic cervical spine degeneration. No acute osseous abnormality identified. Upper chest: Chronic apical lung scarring is stable. No superior mediastinal lymphadenopathy. Unchanged borderline to mild circumferential wall thickening of the upper thoracic esophagus. Other neck: Negative. Aortic arch: 3 vessel arch configuration. Stable mild distal arch calcified plaque. Right carotid system: Negative brachiocephalic artery and proximal right CCA. Mild plaque proximal to the bifurcation without stenosis. Bulky chronic mostly calcified plaque at the right ICA origin and bulb resulting in radiographic string sign stenosis (series 5, image 94) over the initial 10  mm segment of the vessel. This appears mildly progressed. The right ICA remains patent and is otherwise negative to the skull base. Left carotid system: Minimal plaque at the left CCA origin without stenosis. Mild plaque proximal to the bifurcation without stenosis. Chronic bulky calcified plaque at the left ICA origin resulting in stenosis numerically estimated at 70% with respect to the distal vessel (series 9, image 126). This is stable. Left ICA is tortuous and remains patent distal to the stenosis. Vertebral arteries: Calcified proximal right subclavian artery plaque without stenosis. Dominant right vertebral artery is mildly ectatic without plaque or stenosis to the skull base. Moderate calcified plaque in the proximal left subclavian artery with up to 50% stenosis appears stable. Mild calcified plaque at the non dominant left vertebral artery origin not resulting in stenosis. The left vertebral remains non dominant to the skull base without stenosis. CTA HEAD Posterior circulation: Dominant right vertebral artery primarily supplies the basilar with minimal calcified plaque and no stenosis. Normal right PICA origin. Left V4 segment is non dominant with new severe stenosis or short segment occlusion just proximal to the vertebrobasilar junction on series 11, image 20. This is about 3 mm in length. The basilar artery is patent but newly diminutive since 2019 with multifocal irregularity. Only mild focal basilar artery stenosis results. The SCA and PCA origins remain patent. Chronic bilateral PCA atherosclerosis has progressed now with severe bilateral P1 and P2 segment stenoses (series 11, images 17 and 18). Despite this there is distal bilateral PCA enhancement. Anterior circulation: Both ICA siphons are patent but heavily calcified. On the left  there is severe supraclinoid stenosis on series 8, image 63, mildly progressed since 2019. On the right there is moderate to severe cavernous and proximal supraclinoid  stenosis due to bulky calcified plaque which appears stable. Carotid termini remain patent. Mild-to-moderate bilateral chronic proximal MCA stenosis has not significantly changed. A1 segments remain normal. Mild bilateral ACA branch irregularity is stable. Distal right M1 stenosis is moderate and increased (series 10, image 16). Left MCA bifurcation remains patent, with stable left MCA branches demonstrating multifocal mild irregularity. Right MCA bifurcation remains patent with stable right MCA branches demonstrating multifocal mild irregularity. Venous sinuses: Patent. Anatomic variants: Dominant right vertebral artery. Review of the MIP images confirms the above findings IMPRESSION: 1. Negative for large vessel occlusion. 2. But positive for chronic severe atherosclerosis which has progressed since a 2019 CTA as follows: - New Severe stenosis or Short Segment Occlusion of the distal Left Vertebral Artery V4 segment. - New diminutive caliber of the Basilar Artery which remains patent. - RADIOGRAPHIC STRING SIGN stenosis of the proximal Right ICA has progressed. Chronic moderate to severe Left ICA siphon stenosis is stable. - progressed Left ICA supraclinoid stenosis which is now moderate to severe. Stable proximal left Left 70% stenosis. - progressed and Severe bilateral PCA P1/P2 stenoses. - progressed and moderate Right MCA distal M1 stenosis. 3. Stable moderate bilateral proximal MCA stenoses. Stable proximal left subclavian artery plaque with 50% stenosis. Electronically Signed   By: Genevie Ann M.D.   On: 08/22/2019 11:59    Assessment/Plan: Diagnosis: R MCA small subcortical infarct 1. Does the need for close, 24 hr/day medical supervision in concert with the patient's rehab needs make it unreasonable for this patient to be served in a less intensive setting? Yes 2. Co-Morbidities requiring supervision/potential complications: severe intracranial large vessel disease, severe diabetes (HgbA1c 14.2), atrial  fibrillation on Eliquis, carotid stenosis, hypertension, HLD, baseline mild dementia, anxiety, depression, hypothyroidism 3. Due to bladder management, bowel management, safety, skin/wound care, disease management, medication administration, pain management and patient education, does the patient require 24 hr/day rehab nursing? Yes 4. Does the patient require coordinated care of a physician, rehab nurse, therapy disciplines of PT, OT to address physical and functional deficits in the context of the above medical diagnosis(es)? Yes Addressing deficits in the following areas: balance, endurance, locomotion, strength, transferring, bowel/bladder control, bathing, dressing, feeding, grooming, toileting and psychosocial support 5. Can the patient actively participate in an intensive therapy program of at least 3 hrs of therapy per day at least 5 days per week? Yes 6. The potential for patient to make measurable gains while on inpatient rehab is good 7. Anticipated functional outcomes upon discharge from inpatient rehab are min assist  with PT, modified independent with OT, supervision with SLP. 8. Estimated rehab length of stay to reach the above functional goals is: 10-14 days 9. Anticipated discharge destination: Home 10. Overall Rehab/Functional Prognosis: good  RECOMMENDATIONS: This patient's condition is appropriate for continued rehabilitative care in the following setting: CIR Patient has agreed to participate in recommended program. Yes Note that insurance prior authorization may be required for reimbursement for recommended care.  Comment: Mrs. Bachtell may be a good CIR candidate once medically stable (she may receive revascularization of right ICA later this week). At baseline she was independent in her son's home. She will likely require supervision at home and her son works during the day. He says his sister may be able to assist with supervision during the day.   Lavon Paganini Angiulli,  PA-C 08/23/2019   I have personally performed a face to face diagnostic evaluation, including, but not limited to relevant history and physical exam findings, of this patient and developed relevant assessment and plan.  Additionally, I have reviewed and concur with the physician assistant's documentation above.  Leeroy Cha, MD

## 2019-08-23 NOTE — Progress Notes (Signed)
STROKE TEAM PROGRESS NOTE   INTERVAL HISTORY Her son is at the bedside.  Patient is lying in bed comfortably.  Her neurological exam remains unchanged.  She has right gaze preference.  She has diminished attention to the left with some neglect.  Vital signs stable.  Carotid ultrasound Confirms CT angiogram findings of 80 to 99% right ICA stenosis.  Echocardiogram was normal.  EEG study done yesterday was normal Vitals:   08/23/19 0005 08/23/19 0358 08/23/19 0747 08/23/19 1218  BP: 125/60 (!) 132/57 (!) 122/59 119/66  Pulse: 70 68 75 72  Resp: 16 17 18 20   Temp: 98 F (36.7 C) 97.7 F (36.5 C) 98.1 F (36.7 C) 98.1 F (36.7 C)  TempSrc:  Oral Oral Oral  SpO2: 96% 94% 97% 97%  Weight:      Height:        CBC:  Recent Labs  Lab 08/22/19 1137 08/23/19 0425  WBC 7.2 5.5  NEUTROABS 4.6 3.5  HGB 12.2 12.0  HCT 36.0 36.2  MCV 88.9 88.1  PLT 217 606    Basic Metabolic Panel:  Recent Labs  Lab 08/22/19 1137 08/23/19 0425  NA 139 142  K 3.6 3.0*  CL 107 108  CO2 21* 23  GLUCOSE 127* 165*  BUN 6* 5*  CREATININE 0.65 0.61  CALCIUM 8.7* 8.8*  MG  --  1.4*  PHOS  --  3.8   Lipid Panel:     Component Value Date/Time   CHOL 204 (H) 08/22/2019 0459   TRIG 338 (H) 08/22/2019 0459   HDL 33 (L) 08/22/2019 0459   CHOLHDL 6.2 08/22/2019 0459   VLDL 68 (H) 08/22/2019 0459   LDLCALC 103 (H) 08/22/2019 0459   HgbA1c:  Lab Results  Component Value Date   HGBA1C 14.2 (H) 08/22/2019   Urine Drug Screen:     Component Value Date/Time   LABOPIA NONE DETECTED 06/20/2017 0019   COCAINSCRNUR NONE DETECTED 06/20/2017 0019   LABBENZ NONE DETECTED 06/20/2017 0019   AMPHETMU NONE DETECTED 06/20/2017 0019   THCU NONE DETECTED 06/20/2017 0019   LABBARB NONE DETECTED 06/20/2017 0019    Alcohol Level     Component Value Date/Time   ETH <10 06/19/2017 2247    IMAGING past 24 hours EEG adult  Result Date: 08/22/2019 Jaclyn Goodell, MD     08/22/2019  3:21 PM ELECTROENCEPHALOGRAM  REPORT Patient: Jaclyn [name]       Room #: 3W34C EEG No. ID: [id number] Age: 84 y.o.        Sex: female Requesting Physician: Jaclyn [name] Report Date:  08/22/2019       Interpreting Physician: Jaclyn [name] History: Jaclyn [name] is an 84 y.o. female with altered mental status Medications: Eliquis, Lipitor, Insulin, Synthroid, Tradjenta Conditions of Recording:  This is a 21 channel routine scalp EEG performed with bipolar and monopolar montages arranged in accordance to the international 10/20 system of electrode placement. One channel was dedicated to EKG recording. The patient is in the awake and drowsy states. Description:  The patient is uncooperative and muscle and movement artifact are prominent during the recording.  When a waking background activity can be evaluated it consists of a low voltage, symmetrical, fairly well organized, 8 Hz alpha activity, seen from the parieto-occipital and posterior temporal regions.  Low voltage fast activity, poorly organized, is seen anteriorly and is at times superimposed on more posterior regions.  A mixture of theta and alpha rhythms are seen from the central and temporal regions. The  patient drowses with slowing to irregular, low voltage theta and beta activity.  Stage II sleep is not obtained. No epileptiform activity is noted.  Hyperventilation and intermittent photic stimulation were not performed. IMPRESSION: Normal electroencephalogram, awake and drowsy. There are no focal lateralizing or epileptiform features. Jaclyn Goodell, MD Neurology [phone] 08/22/2019, 3:17 PM   VAS US CAROTID  Result Date: 08/23/2019 Carotid Arterial Duplex Study Indications:       CVA. Risk Factors:      Hypertension, hyperlipidemia, Diabetes. Comparison Study:  06-20-2017 Carotid duplex RT ICA 60-79%, LT ICA 1-39% Performing Technologist: Jaclyn [name]  Examination Guidelines: A complete evaluation includes B-mode imaging, spectral Doppler, color Doppler, and power Doppler as needed  of all accessible portions of each vessel. Bilateral testing is considered an integral part of a complete examination. Limited examinations for reoccurring indications may be performed as noted.  Right Carotid Findings: +----------+--------+--------+--------+------------------+--------+           PSV cm/sEDV cm/sStenosisPlaque DescriptionComments +----------+--------+--------+--------+------------------+--------+ CCA Prox  97      13              heterogenous               +----------+--------+--------+--------+------------------+--------+ CCA Distal154     19              heterogenous               +----------+--------+--------+--------+------------------+--------+ ICA Prox  375     121     80-99%  calcific                   +----------+--------+--------+--------+------------------+--------+ ICA Mid   154     36                                         +----------+--------+--------+--------+------------------+--------+ ICA Distal64      18                                         +----------+--------+--------+--------+------------------+--------+ ECA       510             >50%                               +----------+--------+--------+--------+------------------+--------+ +----------+--------+-------+----------------+-------------------+           PSV cm/sEDV cmsDescribe        Arm Pressure (mmHG) +----------+--------+-------+----------------+-------------------+ Subclavian129            Multiphasic, WNL                    +----------+--------+-------+----------------+-------------------+ +---------+--------+--+--------+-+---------+ VertebralPSV cm/s34EDV cm/s9Antegrade +---------+--------+--+--------+-+---------+  Left Carotid Findings: +----------+--------+--------+--------+------------------+--------+           PSV cm/sEDV cm/sStenosisPlaque DescriptionComments +----------+--------+--------+--------+------------------+--------+ CCA Prox   121     14              heterogenous               +----------+--------+--------+--------+------------------+--------+ CCA Distal143     20              heterogenous               +----------+--------+--------+--------+------------------+--------+ ICA Prox  133     48  40-59%  calcific                   +----------+--------+--------+--------+------------------+--------+ ICA Mid   119     28                                         +----------+--------+--------+--------+------------------+--------+ ICA Distal74      27                                         +----------+--------+--------+--------+------------------+--------+ ECA       188                                                +----------+--------+--------+--------+------------------+--------+ +----------+--------+--------+----------------+-------------------+           PSV cm/sEDV cm/sDescribe        Arm Pressure (mmHG) +----------+--------+--------+----------------+-------------------+ [id number]              Multiphasic, WNL                    +----------+--------+--------+----------------+-------------------+ +---------+--------+--+--------+-+---------+ VertebralPSV cm/s47EDV cm/s6Antegrade +---------+--------+--+--------+-+---------+   Summary: Right Carotid: Velocities in the right ICA are consistent with a 80-99%                stenosis. Non-hemodynamically significant plaque <50% noted in                the CCA. The ECA appears >50% stenosed. Left Carotid: Velocities in the left ICA are consistent with a 40-59% stenosis.               Non-hemodynamically significant plaque <50% noted in the CCA. Vertebrals:  Bilateral vertebral arteries demonstrate antegrade flow. Subclavians: Normal flow hemodynamics were seen in bilateral subclavian              arteries. *See table(s) above for measurements and observations.     Preliminary     PHYSICAL EXAM Pleasant elderly Caucasian lady not in  distress. . Afebrile. Head is nontraumatic. Neck is supple without bruit.    Cardiac exam no murmur or gallop. Lungs are clear to auscultation. Distal pulses are well felt. Neurological Exam : Patient is awake alert has right gaze preference and right head deviation.  She has left inattention.  Blinks to threat on the right but not on the left.  Follows simple commands.  Speech is clear without dysarthria or aphasia.  Mild left lower facial asymmetry.  Tongue midline.  Motor system exam able to move her right-sided extremities more than left side but is able to move left side well also except mild left upper extremity grip and intrinsic hand muscle weakness. ASSESSMENT/PLAN Ms. Jaclyn [name] is a 84 y.o. female with history of atrial fibrillation on Eliquis, diabetes, bilateral ICA stenosis, hyperlipidemia, fibromyalgia, headaches, multiple TIAs with left-sided symptoms with no residual deficits presenting with Left-sided weakness, altered mental status, urinary frequency, hyperglycemia.   Stroke:   R MCA small subcortical infarct in setting of severe intracranial large vessel disease, notably the R ICA and AF on Eliquis.. Neurological exams appears out of proportion to the tiny infarct and possibility of underlying seizures is also consideration  CT head  No acute abnormality. Small vessel disease. Atrophy.   MRI  Posterior R frontal corona radiata / centrum semiovale periventricular infarct   CTA head & neck progressive chronic severe atherosclerosis (new severe distal L V4, new small caliber BA, string sign proximal R ICA, moderate to severe L ICA siphon, moderate to severe L ICA supraclinoid 70%, severe B P1/2, moderate R M1)  2D Echo EF 65-70 %. No source of embolus   EEG pending LDL 103  HgbA1c 14.2  Eliquis for VTE prophylaxis  Eliquis (apixaban) daily prior to admission, now on Eliquis (apixaban) daily.    Therapy recommendations:  CIR  Disposition:  pending   Atrial  Fibrillation  Home anticoagulation:  Eliquis (apixaban) daily , continued in the hospital . Continue Eliquis (apixaban) daily at discharge   Carotid Stenosis   CTA neck R ICA string sign, L ICA 70%  Dr. Oneida Alar saw 06/2017 w/ OP f/u planned. Pt hesitant for OR.   May  Need PTA/stent if RICA stenosis has progressed  Hypertension  Stable . Permissive hypertension (OK if < 220/120) but gradually normalize in 5-7 days . Long-term BP goal 130-150 give severe intracranial and neck atherosclerosis   Hyperlipidemia  Home meds:  No statin  Now on lipitor 10  LDL 103, goal < 70  Agree with plan for OP Statin plus CoQ10 (hx myalgia w/ statins) and if intolerant to this would consider PCSK9 inhibitors  Continue statin at discharge  Diabetes type II Uncontrolled  HgbA1c 14.2, goal < 7.0  Other Stroke Risk Factors  Advanced age  Family hx stroke (father)  Other Active Problems  Baseline mild dementia  Anxiety, depression  Hypothyroid   Hospital day # 1 Recommend vascular surgery consult to consider right carotid revascularization prior to discharge given high-grade symptomatic right carotid stenosis.Starleen Blue out of bed.  Therapy consults.  Continue Eliquis given history of atrial fibrillation but will need to hold 48 hours prior to right carotid revascularization..  Long discussion with the patient and her son and answered questions.  Discussed with Dr. Marcelline Deist Powell.greater than 50% time during this 25-minute visit was spent on counseling and coordination of care and discussion with care team about her stroke, carotid stenosis and atrial fibrillation and answering questions.  Stroke team will sign off.  Kindly call for questions. Antony Contras, MD  To contact Stroke Continuity provider, please refer to [email or web address] After hours, contact General Neurology

## 2019-08-23 NOTE — Evaluation (Signed)
Speech Language Pathology Evaluation Patient Details Name: Jaclyn [name] MRN: [id number] DOB: 09-Jan-1936 Today's Date: 08/23/2019 Time: 1300-1340 SLP Time Calculation (min) (ACUTE ONLY): 40 min  Problem List:  Patient Active Problem List   Diagnosis Date Noted  . Acute CVA (cerebrovascular accident) ([place]) 08/22/2019  . CVA (cerebral vascular accident) ([place]) 08/22/2019  . Vagina, candidiasis 03/10/2019  . Mild dementia ([place]) 03/10/2019  . Acute respiratory failure with hypoxia ([place]) 01/24/2019  . Acute metabolic encephalopathy 99/35/7017  . COVID-19 virus infection 01/15/2019  . Acute lower UTI 01/15/2019  . B12 deficiency 01/22/2018  . History of CVA (cerebrovascular accident) 06/20/2017  . Stenosis of right carotid artery   . Peripheral edema 07/29/2016  . Imbalance 04/07/2016  . Intertriginous candidiasis 04/26/2015  . Hard of hearing 04/12/2015  . TIA (transient ischemic attack) 04/10/2015  . History of recurrent UTIs 04/10/2015  . Dysuria 03/09/2015  . Tremor 05/11/2014  . Leg weakness, bilateral 05/11/2014  . Fatty liver 08/04/2013  . Personal history of colonic polyps 10/05/2012  . Esophageal reflux 10/05/2012  . Carotid stenosis, symptomatic, with infarction ([place]) 10/14/2010  . ANXIETY DEPRESSION 01/13/2008  . Essential hypertension, benign 01/13/2008  . PAROXYSMAL ATRIAL FIBRILLATION 01/13/2008  . Intracranial vascular stenosis 01/13/2008  . DIVERTICULOSIS OF COLON 01/13/2008  . Fibromyalgia 01/13/2008  . CHEST PAIN, ATYPICAL 01/13/2008  . Hypothyroidism 04/13/2007  . Poorly controlled type 2 diabetes mellitus with complication ([place]) 79/39/0300  . Vitamin D deficiency 04/13/2007  . Hyperlipidemia LDL goal <70 04/13/2007  . DEGENERATIVE JOINT DISEASE 04/13/2007   Past Medical History:  Past Medical History:  Diagnosis Date  . Atrial fibrillation ([place])   . Benign neoplasm of colon   . Carotid artery occlusion    60-79% right ICA stenosis  . Carotid bruit    . Cerebrovascular disease, unspecified   . Difficult intubation 2008   During surgery to remove large polyp  . Diverticulosis of colon (without mention of hemorrhage)   . Dysthymic disorder   . Fibromyalgia   . Headache(784.0)   . Insomnia, unspecified   . Interstitial cystitis   . Myalgia and myositis, unspecified   . Osteoarthrosis, unspecified whether generalized or localized, unspecified site   . Other and unspecified hyperlipidemia   . Other chest pain   . Other specified benign mammary dysplasias   . TIA (transient ischemic attack)   . Type II or unspecified type diabetes mellitus without mention of complication, not stated as uncontrolled   . Unspecified essential hypertension   . Unspecified hypothyroidism   . Unspecified vitamin D deficiency   . Urinary tract infection, site not specified    Past Surgical History:  Past Surgical History:  Procedure Laterality Date  . ABDOMINAL HYSTERECTOMY  1988   cervical dysplasia  . CARDIAC CATHETERIZATION  02/19/06   EF 60%  . COLONOSCOPY    . RIGHT COLECTOMY  2005   for villous adenoma of the cecum Dr.streck  . VESICOVAGINAL FISTULA CLOSURE W/ TAH  1990   w/ cystocele &retocele repairs Dr. Ree Edman   HPI:  Pt is an 84 y.o. female with past medical history significant for dementia, A. fib, diverticulosis, fibromyalgia, interstitial cystitis, TIA, type 2 diabetes, hypertension, COVID 12/2018 who presented with L arm pain/weakness x2 days, urinary frequency and hyperglycemia x3 days, confusion with words "not really making sense". MRI brain: 6 mm acute ischemic nonhemorrhagic periventricular white matter infarct involving the posterior right frontal corona radiata/centrum semi ovale.   Assessment / Plan / Recommendation Clinical Impression  Jaclyn [name] largely c/b profound attention deficits negatively impacting all other domains of cognition. Son who is her live-in caregiver was  present and reports that "mom is 99% back to normal today." She has a h/o dementia, but son reports that she was "doing okay handling her meds" prior to this admission. Upon elaboration, son reports that pt was frequently missing her medications, but he thought she "usually got the important ones." Diabetes has not been managed and son did not appear to be concerned with that. Son handles driving. Pt is very HOH and had frequent inappropriate laughing. Pt noted to perseverate on her history of COVID. Attention [name] impacted memory, calculations, problem solving, following simple directions, and verbal reasoning. Recommend: full assist for medication management (organization AND ensuring they are taken), full assist for financial management, any problem solving, safety/judgment, 24/7 supervision d/t impulsivity  Of note, pt/son report diabetes is not managed on her medication, however, now that it is consistently taken in hospital, she is well-managed. Suspect son has not been supervising meds as needed.   Son present and educated on recommendations; unsure of his capacity. Should he be unable to fulfill these obligations, SNF may be indicated    SLP Assessment  SLP Recommendation/Assessment: All further Speech Lanaguage Pathology  needs can be addressed in the next venue of care SLP Visit Diagnosis: Attention and concentration deficit;Cognitive communication deficit (R41.841)    Follow Up Recommendations  Inpatient Rehab;Skilled Nursing facility Based on family report that patient is near baseline, no acute ST indicated. Pt may benefit from further cognitive therapy/strategies at next venue of care.      SLP Evaluation Cognition  Overall Cognitive Status: Impaired/Different from baseline Arousal/Alertness: Awake/alert Orientation Level: Oriented to person;Oriented to place;Oriented to time;Disoriented to situation Attention: Focused;Sustained Focused Attention: Impaired Focused Attention  [name]: Verbal basic;Functional basic Sustained Attention: Impaired Sustained Attention [name]: Verbal basic;Functional basic Memory: Impaired Memory [name]: Storage deficit;Retrieval deficit;Decreased recall of new information;Decreased short term memory Decreased Short Term Memory: Verbal basic;Functional basic Awareness: Impaired Awareness [name]: Intellectual [name] Problem Solving: Impaired Problem Solving [name]: Verbal basic;Functional basic Executive Function: Reasoning;Decision Making Reasoning: Impaired Reasoning [name]: Verbal basic;Functional basic Decision Making: Impaired Decision Making [name]: Verbal basic;Functional basic Behaviors: Restless;Impulsive;Verbal agitation;Perseveration Safety/Judgment: Impaired       Oral / Motor  Oral Motor/Sensory Function Overall Oral Motor/Sensory Function: Within functional limits Motor Speech Intelligibility: Intelligibility reduced Word: 75-100% accurate Phrase: 75-100% accurate Sentence: 75-100% accurate Conversation: 75-100% accurate Effective Techniques: Over-articulate;Slow rate   Standardized Test: [facility] Mental Status Examination Orientation: 3/3 Calculations: 0/3 Generative Naming: 1/3 Recall: 1/5 Attention: 0/2 (could not learn example question) Visual Processing: 2/4 (L inattention) Paragraph Memory: 2/8 Total: 9/30, indicative of severe cognitive [name]; consistent with dementia  Brendalee Matthies P. Evangelynn Lochridge, M.S., [place] Pathologist Acute Rehabilitation Services Pager: [place] 08/23/2019, 1:45 PM

## 2019-08-23 NOTE — Progress Notes (Signed)
Inpatient Rehabilitation Admissions Coordinator  Another consultant in with patient and son. I will follow up tomorrow. Noted plans for surgery on Friday.  Danne Baxter, RN, MSN Rehab Admissions Coordinator [phone] 08/23/2019 4:30 PM

## 2019-08-23 NOTE — Progress Notes (Signed)
ANTICOAGULATION CONSULT NOTE - Follow Up Consult  Pharmacy Consult for Eliquis to Heparin Indication: chest pain/ACS and atrial fibrillation  Allergies  Allergen Reactions  . Naproxen Sodium Other (See Comments)    Fever/aches and pains  . Statins Other (See Comments)    myalgias  . Sulfonamide Derivatives Hives and Itching  . Latex Itching and Rash  . Shellfish Allergy Itching, Swelling and Rash    Seafood, shrimp    Patient Measurements: Height: 5\' 2"  (157.5 cm) Weight: 66 kg (145 lb 8.1 oz) IBW/kg (Calculated) : 50.1  Vital Signs: Temp: 98.1 F (36.7 C) (07/06 1218) Temp Source: Oral (07/06 1218) BP: 119/66 (07/06 1218) Pulse Rate: 72 (07/06 1218)  Labs: Recent Labs    08/21/19 1939 08/21/19 1939 08/22/19 1137 08/23/19 0425  HGB 11.4*   < > 12.2 12.0  HCT 34.3*  --  36.0 36.2  PLT 197  --  217 204  CREATININE 0.75  --  0.65 0.61   < > = values in this interval not displayed.    Estimated Creatinine Clearance: 48.4 mL/min (by C-G formula based on SCr of 0.61 mg/dL).  Assessment: 84 year old female to begin heparin while Eliquis is on hold. With carotid stenosis with plans for surgery on Friday? Last dose of Eliquis this morning  Goal of Therapy:  aPTT 66-102 seconds seconds Monitor platelets by anticoagulation protocol: Yes   Plan:  Begin heparin at 900 units / hr at 10 pm 8 hour heparin level, PTT Daily heparin level, CBC, PTT  Thank you Anette Guarneri, PharmD 08/23/2019,2:57 PM

## 2019-08-23 NOTE — Progress Notes (Signed)
Carotid duplex bilateral study completed.   See Cv Proc for preliminary results.   Jaclyn [name]  

## 2019-08-23 NOTE — Consult Note (Addendum)
Hospital Consult  VASCULAR SURGERY ASSESSMENT & PLAN:   SYMPTOMATIC RIGHT CAROTID STENOSIS: This patient has a symptomatic greater than 80% right carotid stenosis with a 40 to 59% left carotid stenosis.  Her left arm weakness has improved but she still has a significant deficit.  I think that if she continues to make improvement clinically she would be a candidate for right carotid endarterectomy in order to lower her risk of future stroke.  She is on Eliquis and I will hold this and have the pharmacy manage heparin pending surgery potentially Friday.  She is on a statin.  I have added 81 mg of aspirin.  I have consulted cardiology for preoperative cardiac clearance.  She was previously seen by [name].  We tentatively plan on right carotid endarterectomy on Friday.  Based on her CT angiogram of the neck this does appear to be surgically accessible.  I have reviewed the indications for carotid endarterectomy, that is to lower the risk of future stroke. I have also reviewed the potential complications of surgery, including but not limited to: bleeding, stroke (perioperative risk 1-2%), MI, nerve injury of other unpredictable medical problems.  I have discussed this with the patient and her son at the bedside.  Deitra Mayo, MD Office: [phone]    Reason for Consult:  R CVA, R ICA stenosis Requesting Physician:  Dr. Leonie Man MRN #:  [id number]  History of Present Illness: This is a 84 y.o. female with past medical history significant for atrial fibrillation on Eliquis, type 2 diabetes mellitus, and hypertension.  She is being seen in consultation for evaluation of symptomatic right internal carotid artery stenosis.  She presented to the Emergency department on 08/22/2019 with a 2-day history of confusion and left arm weakness.  Work-up included MRI which was significant for acute CVA and right MCA territory.  Work-up also included CTA demonstrating string sign of proximal right ICA and  chronic intracranial stenosis.  Carotid duplex confirmed findings with a 89% stenosis of the proximal right ICA.  Patient lives with her son who is present during my examination.  He states her mental status is much improved compared to yesterday.  Patient states her left arm weakness is also greatly improved however not back to normal.  She was seen in consultation during hospitalization 2019 after a TIA and was offered a right carotid endarterectomy by Dr. Oneida Alar.  Patient and son at the time did not proceed with surgery.  When asked, patient and son are more open to revascularization of right ICA.  She has been evaluated by PT/OT and has been recommended for CIR.  Patient states she would prefer home health.  Past Medical History:  Diagnosis Date   Atrial fibrillation ([place])    Benign neoplasm of colon    Carotid artery occlusion    60-79% right ICA stenosis   Carotid bruit    Cerebrovascular disease, unspecified    Difficult intubation 2008   During surgery to remove large polyp   Diverticulosis of colon (without mention of hemorrhage)    Dysthymic disorder    Fibromyalgia    Headache(784.0)    Insomnia, unspecified    Interstitial cystitis    Myalgia and myositis, unspecified    Osteoarthrosis, unspecified whether generalized or localized, unspecified site    Other and unspecified hyperlipidemia    Other chest pain    Other specified benign mammary dysplasias    TIA (transient ischemic attack)    Type II or unspecified type diabetes mellitus without  mention of complication, not stated as uncontrolled    Unspecified essential hypertension    Unspecified hypothyroidism    Unspecified vitamin D deficiency    Urinary tract infection, site not specified     Past Surgical History:  Procedure Laterality Date   ABDOMINAL HYSTERECTOMY  1988   cervical dysplasia   CARDIAC CATHETERIZATION  02/19/06   EF 60%   COLONOSCOPY     RIGHT COLECTOMY  2005   for villous  adenoma of the cecum Dr.streck   VESICOVAGINAL FISTULA CLOSURE W/ TAH  1990   w/ cystocele &retocele repairs Dr. Ree Edman    Allergies  Allergen Reactions   Naproxen Sodium Other (See Comments)    Fever/aches and pains   Statins Other (See Comments)    myalgias   Sulfonamide Derivatives Hives and Itching   Latex Itching and Rash   Shellfish Allergy Itching, Swelling and Rash    Seafood, shrimp    Prior to Admission medications   Medication Sig Start Date End Date Taking? Authorizing Provider  apixaban (ELIQUIS) 5 MG TABS tablet Take 1 tablet (5 mg total) by mouth 2 (two) times daily. 06/29/19  Yes Bedsole, Amy E, MD  Insulin Glargine (LANTUS SOLOSTAR) 100 UNIT/ML Solostar Pen Inject 65 Units into the skin daily. 03/15/19  Yes Bedsole, Amy E, MD  levothyroxine (SYNTHROID) 112 MCG tablet Take 1 tablet (112 mcg total) by mouth daily. 09/10/18  Yes Bedsole, Amy E, MD  sitaGLIPtin (JANUVIA) 100 MG tablet Take 1 tablet (100 mg total) by mouth daily. 02/10/19  Yes Bedsole, Amy E, MD  trimethoprim (TRIMPEX) 100 MG tablet Take 1 tablet (100 mg total) by mouth daily. 02/22/19  Yes Bedsole, Amy E, MD  B-D ULTRAFINE III SHORT PEN 31G X 8 MM MISC USE TO INJECT INSULIN ONCE DAILY. DX: E11.65 06/28/19   Bedsole, Amy E, MD  DULoxetine (CYMBALTA) 30 MG capsule Take 2 capsules (60 mg total) by mouth daily. Patient not taking: Reported on 08/21/2019 06/29/19   Jinny Sanders, MD  vitamin B-12 (CYANOCOBALAMIN) 1000 MCG tablet Take 1 tablet (1,000 mcg total) by mouth daily. 09/10/18   Bedsole, Amy E, MD  Vitamin D, Ergocalciferol, (DRISDOL) 1.25 MG (50000 UT) CAPS capsule Take 1 capsule (50,000 Units total) by mouth every 7 (seven) days. 09/10/18   Jinny Sanders, MD    Social History   Socioeconomic History   Marital status: Single    Spouse name: Not on file   Number of children: 3   Years of education: Not on file   Highest education level: Not on file  Occupational History   Occupation: retired     Fish farm manager: RETIRED  Tobacco Use   Smoking status: Never Smoker   Smokeless tobacco: Never Used  Substance and Sexual Activity   Alcohol use: No    Alcohol/week: 0.0 standard drinks   Drug use: No   Sexual activity: Not on file  Other Topics Concern   Not on file  Social History Narrative   1 child died at 36 months old --hx of downs syndrome  Lives with son in a 2 story home.  Uses the 1st floor.  Has two living children.  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     Social Determinants of Health   Financial Resource Strain:    Difficulty of Paying Living Expenses:   Food Insecurity:    Worried About Charity fundraiser in the Last Year:    Ran Out of Food in the Last  Year:   Transportation Needs:    Film/video editor (Medical):    Lack of Transportation (Non-Medical):   Physical Activity:    Days of Exercise per Week:    Minutes of Exercise per Session:   Stress:    Feeling of Stress :   Social Connections:    Frequency of Communication with Friends and Family:    Frequency of Social Gatherings with Friends and Family:    Attends Religious Services:    Active Member of Clubs or Organizations:    Attends Music therapist:    Marital Status:   Intimate Partner Violence:    Fear of Current or Ex-Partner:    Emotionally Abused:    Physically Abused:    Sexually Abused:      Family History  Problem Relation Age of Onset   Lymphoma Father    Hypertension Father    Stroke Father    Hyperlipidemia Father    Hypertension Mother    Allergies Brother    Hypertension Sister        3   Breast cancer Sister    Fibromyalgia Sister        3   Hyperlipidemia Sister        3    ROS: Otherwise negative unless mentioned in HPI  Physical Examination  Vitals:   08/23/19 0747 08/23/19 1218  BP: (!) 122/59 119/66  Pulse: 75 72  Resp: 18 20  Temp: 98.1 F (36.7 C) 98.1 F (36.7 C)  SpO2: 97% 97%   Body mass  index is 26.61 kg/m.  General:  WDWN in NAD Gait: Not observed HENT: WNL, normocephalic Pulmonary: normal non-labored breathing, without Rales, rhonchi,  wheezing Cardiac: regular Abdomen:  soft, NT/ND, no masses Skin: without rashes Vascular Exam/Pulses: Symmetrical radial pulses; symmetrical anterior tibial artery pulses Extremities: without ischemic changes, without Gangrene , without cellulitis; without open wounds;  Musculoskeletal: Left arm weakness and coordination deficit  Neurologic: A&O X 3; left arm weakness and coordination deficit Psychiatric:  The pt has Normal affect. Lymph:  Unremarkable  CBC    Component Value Date/Time   WBC 5.5 08/23/2019 0425   RBC 4.11 08/23/2019 0425   HGB 12.0 08/23/2019 0425   HCT 36.2 08/23/2019 0425   PLT 204 08/23/2019 0425   MCV 88.1 08/23/2019 0425   MCH 29.2 08/23/2019 0425   MCHC 33.1 08/23/2019 0425   RDW 14.6 08/23/2019 0425   LYMPHSABS 1.3 08/23/2019 0425   MONOABS 0.6 08/23/2019 0425   EOSABS 0.1 08/23/2019 0425   BASOSABS 0.0 08/23/2019 0425    BMET    Component Value Date/Time   NA 142 08/23/2019 0425   K 3.0 (L) 08/23/2019 0425   CL 108 08/23/2019 0425   CO2 23 08/23/2019 0425   GLUCOSE 165 (H) 08/23/2019 0425   BUN 5 (L) 08/23/2019 0425   CREATININE 0.61 08/23/2019 0425   CREATININE 0.81 02/10/2019 1240   CALCIUM 8.8 (L) 08/23/2019 0425   GFRNONAA >60 08/23/2019 0425   GFRAA >60 08/23/2019 0425    COAGS: Lab Results  Component Value Date   INR 1.4 (H) 01/24/2019   INR 1.2 01/14/2019   INR 1.24 06/19/2017     Non-Invasive Vascular Imaging:   80 to 90% stenosis right ICA by duplex 40 to 59% stenosis left ICA by duplex  CTA demonstrates string sign proximal right ICA Intracranial right carotid stenosis Short segment occlusion right vertebral   MRI demonstrates acute infarct right MCA territory  ASSESSMENT/PLAN: This is a 84 y.o. female with right CVA, severe right ICA  stenosis  -Radiographic string sign of proximal right ICA by CTA; proximal right ICA remains patent and estimated to be 80 to 99% stenosis by carotid duplex -Clinically patient is much improved cognitively; left arm weakness also greatly improved however not back to baseline -Patient will likely require revascularization of right ICA within the next 2 weeks -Eliquis will need to be held perioperatively -On-call vascular surgeon Dr. Scot Dock will evaluate the patient later today and provide further treatment plans   Dagoberto Ligas PA-C [facility] [phone]

## 2019-08-23 NOTE — Consult Note (Signed)
Cardiology Consultation:   [name] ID: Jaclyn [name] MRN: [id number]; DOB: 24-Apr-1935  Admit date: 08/21/2019 Date of Consult: 08/23/2019  Primary Care Provider: Jinny Sanders, MD [facility] Cardiologist: Jaclyn [name] Electrophysiologist:  N/A  [name] Profile:   Jaclyn [name] is a 84 y.o. female with a hx of symptomatic carotid disease who is being seen today for Jaclyn preoperative cardiovascular evaluation at Jaclyn request of Jaclyn [name].  History of Present Illness:   Jaclyn [name] is a 84 year old female with h/o hypertension, hyperlipidemia, DM, B/L carotid disease, ? TIA and PAF on Eliquis therapy, followed by Jaclyn [name], but not seen since 11/2016.  She presented to Jaclyn Emergency department on 08/22/2019 with a 2-day history of confusion and left arm weakness.  Head MRI which was significant for acute CVA and right MCA territory.  Work-up also included CTA demonstrating string sign of proximal right ICA and chronic intracranial stenosis.  Carotid duplex confirmed findings with a 89% stenosis of Jaclyn proximal right ICA. She was previously seen in consultation during hospitalization 2019 after a TIA and was offered a right carotid endarterectomy by Jaclyn [name].  [name] and son at Jaclyn time did not proceed with surgery.   Her left arm weakness has improved but she still has a significant deficit. She is scheduled for right carotid endarterectomy for this FRiday in order to lower her risk of future stroke.  Jaclyn [name] is accompanied by her son in Jaclyn room and provide history.  She has not been very active in Jaclyn last few years and since Jaclyn Covid started she only left Jaclyn house on several occasions.  She is able to walk slowly around Jaclyn house and perform light chores however would probably not be able to walk a flight of stairs.  She denies any prior chest pain, gets short of breath on moderate exertion.  Her duloxetine was recently changed and she has been noticing  more palpitations mostly in Jaclyn evening lasting minutes and not associated with any presyncopal or syncopal episodes.  She has never smoked but used chewing tobacco when she was younger.  Her father had myocardial infarction at age of 39.   Past Medical History:  Diagnosis Date  . Atrial fibrillation ([place])   . Benign neoplasm of colon   . Carotid artery occlusion    60-79% right ICA stenosis  . Carotid bruit   . Cerebrovascular disease, unspecified   . Difficult intubation 2008   During surgery to remove large polyp  . Diverticulosis of colon (without mention of hemorrhage)   . Dysthymic disorder   . Fibromyalgia   . Headache(784.0)   . Insomnia, unspecified   . Interstitial cystitis   . Myalgia and myositis, unspecified   . Osteoarthrosis, unspecified whether generalized or localized, unspecified site   . Other and unspecified hyperlipidemia   . Other chest pain   . Other specified benign mammary dysplasias   . TIA (transient ischemic attack)   . Type II or unspecified type diabetes mellitus without mention of complication, not stated as uncontrolled   . Unspecified essential hypertension   . Unspecified hypothyroidism   . Unspecified vitamin D deficiency   . Urinary tract infection, site not specified    Past Surgical History:  Procedure Laterality Date  . ABDOMINAL HYSTERECTOMY  1988   cervical dysplasia  . CARDIAC CATHETERIZATION  02/19/06   EF 60%  . COLONOSCOPY    . RIGHT COLECTOMY  2005   for villous  adenoma of Jaclyn cecum Jaclynstreck  . VESICOVAGINAL FISTULA CLOSURE W/ TAH  1990   w/ cystocele &retocele repairs Jaclyn [name]    Inpatient Medications: Scheduled Meds: . aspirin EC  81 mg Oral Daily  . atorvastatin  10 mg Oral Daily  . insulin aspart  0-15 Units Subcutaneous TID WC  . insulin aspart  0-5 Units Subcutaneous QHS  . insulin glargine  65 Units Subcutaneous Daily  . levothyroxine  112 mcg Oral Daily  . linagliptin  5 mg Oral Daily  . trimethoprim  100 mg  Oral Daily  . vitamin B-12  1,000 mcg Oral Daily   Continuous Infusions:  PRN Meds: acetaminophen **OR** acetaminophen (TYLENOL) oral liquid 160 mg/5 mL **OR** acetaminophen, senna-docusate  Allergies:    Allergies  Allergen Reactions  . Naproxen Sodium Other (See Comments)    Fever/aches and pains  . Statins Other (See Comments)    myalgias  . Sulfonamide Derivatives Hives and Itching  . Latex Itching and Rash  . Shellfish Allergy Itching, Swelling and Rash    Seafood, shrimp   Social History:   Social History   Socioeconomic History  . Marital status: Single    Spouse name: Not on file  . Number of children: 3  . Years of education: Not on file  . Highest education level: Not on file  Occupational History  . Occupation: retired    Fish farm manager: RETIRED  Tobacco Use  . Smoking status: Never Smoker  . Smokeless tobacco: Never Used  Substance and Sexual Activity  . Alcohol use: No    Alcohol/week: 0.0 standard drinks  . Drug use: No  . Sexual activity: Not on file  Other Topics Concern  . Not on file  Social History Narrative   1 child died at 57 months old --hx of downs syndrome  Lives with son in a 2 story home.  Uses Jaclyn 1st floor.  Has two living children.  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     Social Determinants of Health   Financial Resource Strain:   . Difficulty of Paying Living Expenses:   Food Insecurity:   . Worried About Charity fundraiser in Jaclyn Last Year:   . Arboriculturist in Jaclyn Last Year:   Transportation Needs:   . Film/video editor (Medical):   Marland Kitchen Lack of Transportation (Non-Medical):   Physical Activity:   . Days of Exercise per Week:   . Minutes of Exercise per Session:   Stress:   . Feeling of Stress :   Social Connections:   . Frequency of Communication with Friends and Family:   . Frequency of Social Gatherings with Friends and Family:   . Attends Religious Services:   . Active Member of Clubs or Organizations:   .  Attends Archivist Meetings:   Marland Kitchen Marital Status:   Intimate Partner Violence:   . Fear of Current or Ex-Partner:   . Emotionally Abused:   Marland Kitchen Physically Abused:   . Sexually Abused:     Family History:    Family History  Problem Relation Age of Onset  . Lymphoma Father   . Hypertension Father   . Stroke Father   . Hyperlipidemia Father   . Hypertension Mother   . Allergies Brother   . Hypertension Sister        3  . Breast cancer Sister   . Fibromyalgia Sister        3  . Hyperlipidemia Sister  3    ROS:  Please see Jaclyn history of present illness.  All other ROS reviewed and negative.     Physical Exam/Data:   Vitals:   08/23/19 0005 08/23/19 0358 08/23/19 0747 08/23/19 1218  BP: 125/60 (!) 132/57 (!) 122/59 119/66  Pulse: 70 68 75 72  Resp: 16 17 18 20   Temp: 98 F (36.7 C) 97.7 F (36.5 C) 98.1 F (36.7 C) 98.1 F (36.7 C)  TempSrc:  Oral Oral Oral  SpO2: 96% 94% 97% 97%  Weight:      Height:        Intake/Output Summary (Last 24 hours) at 08/23/2019 1457 Last data filed at 08/23/2019 0743 Gross per 24 hour  Intake --  Output 500 ml  Net -500 ml   Last 3 Weights 08/21/2019 02/10/2019 01/24/2019  Weight (lbs) 145 lb 8.1 oz 146 lb 4 oz 149 lb 14.6 oz  Weight (kg) 66 kg 66.339 kg 68 kg     Body mass index is 26.61 kg/m.  General:  Well nourished, well developed, in no acute distress HEENT: Poor dentition Lymph: no adenopathy Neck: no JVD Endocrine:  No thryomegaly Vascular: No carotid bruits; FA pulses 2+ bilaterally without bruits  Cardiac:  normal S1, S2; RRR; no murmur  Lungs:  clear to auscultation bilaterally, no wheezing, rhonchi or rales  Abd: soft, nontender, no hepatomegaly  Ext: no edema Musculoskeletal:  No deformities, BUE and BLE strength normal and equal Skin: warm and dry  Neuro: Left-sided weakness, hard of hearing  EKG:  Jaclyn EKG was personally reviewed and demonstrates:  SR, Q wave in Jaclyn anterior and inferior leads,  unchanged from tracings in 2020 and 2019, personally reviewed Telemetry:  Telemetry was personally reviewed and demonstrates: Sinus rhythm with ventricular rates 70s to 80s, PVCs  Relevant CV Studies:  TTE 08/22/2019   1. Left ventricular ejection fraction, by estimation, is 65 to 70%. Jaclyn  left ventricle has normal function. Jaclyn left ventricle has no regional  wall motion abnormalities. There is moderate left ventricular hypertrophy.  Left ventricular diastolic parameters are indeterminate.  2. Right ventricular systolic function is normal. Jaclyn right ventricular  size is normal. Mildly increased right ventricular wall thickness.  Tricuspid regurgitation signal is inadequate for assessing PA pressure.  3. Jaclyn mitral valve is grossly normal, mild annular calcification.  Trivial mitral valve regurgitation.  4. Jaclyn aortic valve is tricuspid, mild annular calcification. Aortic  valve regurgitation is not visualized.  5. Jaclyn inferior vena cava is normal in size with greater than 50%  respiratory variability, suggesting right atrial pressure of 3 mmHg  Laboratory Data:  High Sensitivity Troponin:  No results for input(s): TROPONINIHS in Jaclyn last 720 hours.   Chemistry Recent Labs  Lab 08/21/19 1939 08/22/19 1137 08/23/19 0425  NA 132* 139 142  K 3.9 3.6 3.0*  CL 101 107 108  CO2 21* 21* 23  GLUCOSE 360* 127* 165*  BUN 9 6* 5*  CREATININE 0.75 0.65 0.61  CALCIUM 8.4* 8.7* 8.8*  GFRNONAA >60 >60 >60  GFRAA >60 >60 >60  ANIONGAP 10 11 11     Recent Labs  Lab 08/21/19 1939 08/22/19 1137 08/23/19 0425  PROT 5.8* 6.4* 6.1*  ALBUMIN 3.2* 3.4* 3.1*  AST 20 19 20   ALT 18 16 16   ALKPHOS 62 60 57  BILITOT 1.1 0.6 1.0   Hematology Recent Labs  Lab 08/21/19 1939 08/22/19 1137 08/23/19 0425  WBC 6.7 7.2 5.5  RBC 3.81* 4.05 4.11  HGB 11.4* 12.2 12.0  HCT 34.3* 36.0 36.2  MCV 90.0 88.9 88.1  MCH 29.9 30.1 29.2  MCHC 33.2 33.9 33.1  RDW 14.6 14.5 14.6  PLT 197 217 204     BNPNo results for input(s): BNP, PROBNP in Jaclyn last 168 hours.  DDimer No results for input(s): DDIMER in Jaclyn last 168 hours.  Radiology/Studies:  CT Head Wo Contrast  Result Date: 08/21/2019 CLINICAL DATA:  Neuro deficits. Subacute arm weakness. Increasingly confused since Friday.Marland Kitchen IMPRESSION: 1. No evidence for acute intracranial abnormality. 2. Atrophy and small vessel disease.   MR BRAIN WO CONTRAST  Result Date: 08/21/2019 CLINICAL DATA:  Initial evaluation for increased confusion, focal neural deficit.  IMPRESSION: 1. 6 mm acute ischemic nonhemorrhagic periventricular white matter infarct involving Jaclyn posterior right frontal corona radiata/centrum semi ovale. 2. No other acute intracranial abnormality. 3. Age-related cerebral atrophy with moderate chronic microvascular ischemic disease. Electronically Signed   By: Jeannine Boga M.D.   On: 08/21/2019 23:44   EEG adult  Result Date: 08/22/2019  IMPRESSION: Normal electroencephalogram, awake and drowsy. There are no focal lateralizing or epileptiform features. [name], MD Neurology [phone] 08/22/2019, 3:17 PM   CT ANGIO HEAD CODE STROKE  Result Date: 08/22/2019 CLINICAL DATA:  84 year old female with a small acute right superior frontal white matter lacunar infarct after presenting with arm weakness and confusion.  IMPRESSION: 1. Negative for large vessel occlusion. 2. But positive for chronic severe atherosclerosis which has progressed since a 2019 CTA as follows: - New Severe stenosis or Short Segment Occlusion of Jaclyn distal Left Vertebral Artery V4 segment. - New diminutive caliber of Jaclyn Basilar Artery which remains patent. - RADIOGRAPHIC STRING SIGN stenosis of Jaclyn proximal Right ICA has progressed. Chronic moderate to severe Left ICA siphon stenosis is stable. - progressed Left ICA supraclinoid stenosis which is now moderate to severe. Stable proximal left Left 70% stenosis. - progressed and Severe bilateral PCA P1/P2  stenoses. - progressed and moderate Right MCA distal M1 stenosis. 3. Stable moderate bilateral proximal MCA stenoses. Stable proximal left subclavian artery plaque with 50% stenosis. Electronically Signed   By: Genevie Ann M.D.   On: 08/22/2019 11:59   Carotid US:  Right Carotid: Velocities in Jaclyn right ICA are consistent with a 80-99%  stenosis.  Jaclyn ECA appears >50% stenosed.   Left Carotid: Velocities in Jaclyn left ICA are consistent with a 40-59%  stenosis.   Vertebrals: Bilateral vertebral arteries demonstrate antegrade flow.  Subclavians: Normal flow hemodynamics were seen in bilateral subclavian        arteries.    Assessment and Plan:   Symptomatic right carotid stenosis - > 80% right ICA stenosis and with a 40 to 59% left carotid stenosis - scheduled for  - ongoing neurological deficit, right carotid endarterectomy scheduled for Friday 7/9   Preoperative CV evaluation  - ECG shows SR with Q waves in Jaclyn anterior and inferior leads - TTE on 08/22/19 shows hyperdynamic LVEF 65-70% and grade 1 diastolic dysfunction -  She is on a statin, Eliquis -> Heparin, aspirin 81 mg po daily was added today -Jaclyn [name] is minimally active and would not be able to perform 4 METS, she has multiple risk factors including diabetes, hypertension hyperlipidemia symptomatic carotid disease and prior tobacco chewing -We will obtain coronary CTA to further evaluate, will give metoprolol 50 mg tonight and 50 mg tomorrow in Jaclyn morning. - Coronary ischemia (FFR < 0.8) was found in 57%, severe ischemia (FFR < 0.75)  in 43% of patients undergoing carotid endarterectomy Doyle Askew et al. Journal of Vascular Surgery).  Hypertension -Controlled  Hyperlipidemia -On atorvastatin 10 mg daily, with Jaclyn risk factors she should be on at least 40 mg daily.  Paroxysmal atrial fibrillation -This has never been documented, however with prior TIA, no stroke should continue be on Eliquis, no evidence for atrial  fibrillation on telemetry here.  For questions or updates, please contact [place] Please consult [email or web address] for contact info under   Signed, Ena Dawley, MD  08/23/2019 2:57 PM

## 2019-08-23 NOTE — Evaluation (Signed)
Occupational Therapy Evaluation Patient Details Name: Jaclyn [name] MRN: [id number] DOB: [birth year] Today's Date: 08/23/2019    History of Present Illness Patient is a 84 y/o female who presents with AMS and LUE weakness s/p fall at home. Found to have urinary frequency and hyperglycemia. 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 PMH includes COVID, CVA, dementia, HTN, hypothyroidism, recurrent UTI.   Clinical Impression   PT admitted with CVA. Pt currently with functional limitiations due to the deficits listed below (see OT problem list). Pt currently requires min (A) hand held (A) for basic transfer. Pt noted to have bruises down L side of body. Pt responds best when spoken to on the R at her ear.  Pt will benefit from skilled OT to increase their independence and safety with adls and balance to allow discharge CIR.     Follow Up Recommendations  CIR    Equipment Recommendations  3 in 1 bedside commode    Recommendations for Other Services Rehab consult     Precautions / Restrictions Precautions Precautions: Fall Precaution Comments: left inattention      Mobility Bed Mobility Overal bed mobility: Needs Assistance Bed Mobility: Supine to Sit;Sit to Supine     Supine to sit: Min guard Sit to supine: Min guard   General bed mobility comments: HOB elevated. pt able to bring feet around with mod cues to initiate and push up with L UE into sitting  Transfers Overall transfer level: Needs assistance Equipment used: 1 person hand held assist Transfers: Sit to/from [facility] Sit to Stand: Min assist         General transfer comment: pt progressed sit<>stand at EOB, transfer to sink sit in chair with sit<>Stand, completed toilet transfer sit<>Stand and then back to the bed surface    Balance     Sitting balance-Leahy Scale: Fair        Standing balance-Leahy Scale: Poor                             ADL either performed or assessed with clinical judgement   ADL Overall ADL's : Needs assistance/impaired Eating/Feeding: Set up Eating/Feeding Details (indicate cue type and reason): pt with recent partial denture fallign out of mouth. son reports "we have them at home but she doesnt have a dentist any more" ( 1 week ago fell out eating. Pt eating all areas of tray this session. pt expressed strong love of pinto beans Grooming: Wash/dry face;Minimal assistance Grooming Details (indicate cue type and reason): pt requires a seated position for safety.                  Toilet Transfer: Minimal assistance;Grab Information systems manager Details (indicate cue type and reason): heavy reliance on grab bar with R UE. pt needs (A) To power up into standing   Toileting - Clothing Manipulation Details (indicate cue type and reason): no void at this time. pt with wet pad and changed to decrease risk for UTI. son states "she said it was wet but i thought it was suppose to pull it away from her" educated pt and son on purewick pulls away to wall but due to poor fit with pad present all the urine was in the pad not the purewick. recommend toileting patient and decrease use of purewick with pad due t o UTI risk     Functional mobility during  ADLs: Minimal assistance General ADL Comments: pt provided hand held (A) this session on L side. Pt able to push down through elbow during transfer activating L shoulder. pt reports dizziness with movement     Vision   Additional Comments: pt visually scanning to the L side when spoken to. pt prefers to turn to the R during session to read son's lips due to [facility]. pt staring at board with puzzled look but does not read any thing off board out loud whne asked     Perception     Praxis      Pertinent Vitals/Pain Pain Assessment: No/denies pain     Hand Dominance Right    Extremity/Trunk Assessment Upper Extremity Assessment Upper Extremity Assessment: LUE deficits/detail LUE Deficits / Details: noted to have bruises at the wrist, pt holding wrist in flexion but able to move when cued. pt with increased movement with visual attention. pt able to sustain shoulder flexion > 75 degrees during grooming task LUE Sensation: decreased proprioception LUE Coordination: decreased fine motor   Lower Extremity Assessment Lower Extremity Assessment: Defer to PT evaluation   Cervical / Trunk Assessment Cervical / Trunk Assessment: Normal   Communication Communication Communication: HOH   Cognition Arousal/Alertness: Awake/alert Behavior During Therapy: WFL for tasks assessed/performed Overall Cognitive Status: History of cognitive impairments - at baseline Area of Impairment: Attention;Awareness;Problem solving                   Current Attention Level: Selective   Following Commands: Follows multi-step commands inconsistently Safety/Judgement: Decreased awareness of deficits Awareness: Emergent Problem Solving: Slow processing General Comments: pt reports location, place, reason for admission, who the president is and what the month is very brisk. Pt is HOH but once discovered R side hearing is better much faster wtih responses. pt very tangential during sessions but son reports "this is normal for her to talk like this"    General Comments  son present throughout session.     Exercises     Shoulder Instructions      Home Living Family/patient expects to be discharged to:: Private residence   Available Help at Discharge: Family;Available PRN/intermittently Type of Home: House Home Access: Level entry     Home Layout: One level     Bathroom Shower/Tub: Tub/shower unit;Curtain   Biochemist, clinical: Standard     Home Equipment: Cane - single point;Grab bars - tub/shower;Bedside commode   Additional Comments: worked for [employer] long for a  time  Lives With: Son    Prior Functioning/Environment Level of Independence: Independent with assistive device(s)    ADL's / Homemaking Assistance Needed: son reports that he stands in the bathroom while she showers to make sure she is okay but she does all bathing without his physical (A)    Comments: reports continous UTIs and increased balance deficits during the UTIs        OT Problem List: Decreased activity tolerance;Impaired balance (sitting and/or standing);Decreased cognition;Decreased safety awareness;Decreased knowledge of use of DME or AE;Cardiopulmonary status limiting activity;Impaired UE functional use;Decreased coordination      OT Treatment/Interventions: Self-care/ADL training;Therapeutic exercise;Neuromuscular education;Energy conservation;DME and/or AE instruction;Manual therapy;Modalities;Therapeutic activities;Cognitive remediation/compensation;Patient/family education;Balance training    OT Goals(Current goals can be found in the care plan section) Acute Rehab OT Goals Patient Stated Goal: i love these pinto beans. somone can cook. taste just like my momma's on a wood burning stove OT Goal Formulation: With patient Time For Goal Achievement: 09/06/19 Potential to Achieve Goals: Good  OT  Frequency: Min 3X/week   Barriers to D/C:    son works and has a disabled sister that also lives within the home.        Co-evaluation              AM-PAC OT "6 Clicks" Daily Activity     Outcome Measure Help from another person eating meals?: A Little Help from another person taking care of personal grooming?: A Lot Help from another person toileting, which includes using toliet, bedpan, or urinal?: A Lot Help from another person bathing (including washing, rinsing, drying)?: A Lot Help from another person to put on and taking off regular upper body clothing?: A Lot Help from another person to put on and taking off regular lower body clothing?: A Lot 6 Click Score:  13   End of Session Nurse Communication: Mobility status;Precautions  Activity Tolerance: Patient tolerated treatment well Patient left: in bed;with call bell/phone within reach;with bed alarm set  OT Visit Diagnosis: Unsteadiness on feet (R26.81);Muscle weakness (generalized) (M62.81)                Time: 2395-3202 OT Time Calculation (min): 23 min Charges:  OT General Charges $OT Visit: 1 Visit OT Evaluation $OT Eval Moderate Complexity: 1 Mod   Brynn, OTR/L  Acute Rehabilitation Services Pager: [phone] Office: [phone] .   Jeri Modena 08/23/2019, 3:34 PM

## 2019-08-23 NOTE — Progress Notes (Signed)
PT Cancellation Note  Patient Details Name: Jaclyn [name] MRN: [id number] DOB: November 28, 1935   Cancelled Treatment:    Reason Eval/Treat Not Completed: Other (comment).  Mult attempts to see pt, and will reattempt at another time as pt can allow.   Ramond Dial 08/23/2019, 4:07 PM   Mee Hives, PT MS Acute Rehab Dept. Number: [place]

## 2019-08-23 NOTE — Progress Notes (Signed)
Inpatient Diabetes Program Recommendations  AACE/ADA: New Consensus Statement on Inpatient Glycemic Control (2015)  Target Ranges:  Prepandial:   less than 140 mg/dL      Peak postprandial:   less than 180 mg/dL (1-2 hours)      Critically ill patients:  140 - 180 mg/dL   Lab Results  Component Value Date   GLUCAP 330 (H) 08/23/2019   HGBA1C 14.2 (H) 08/22/2019    Review of Glycemic Control Results for Jaclyn [name], Jaclyn [name] (MRN [id number]) as of 08/23/2019 13:39  Ref. Range 08/22/2019 12:34 08/22/2019 15:44 08/22/2019 21:38 08/23/2019 06:06 08/23/2019 12:19  Glucose-Capillary Latest Ref Range: 70 - 99 mg/dL 136 (H) 165 (H) 106 (H) 215 (H) 330 (H)   Diabetes history: DM 2 Outpatient Diabetes medications:  Lantus 65 units daily, Januvia 100 mg daily Current orders for Inpatient glycemic control:  Novolog moderate tid with meals and HS Lantus 65 units daily  Inpatient Diabetes Program Recommendations:    Spoke with patient and son regarding home DM management.  Patient's son states that she is taking her insulin "he thinks".  Patient did state she only misses her insulin once in a while, however no matter what her blood sugars are high. Discussed insulin administration technique.  Patient admits that she is taking her insulin in the same place every time.  We discussed the importance of rotating sites.  Patient states "I didn't know that".  I also asked son to remind his mother to take insulin daily.  Blood sugars have been much better in the hospital on the same amount of insulin as home.  May also benefit from home health RN to assess home environment, etc.  I am concerned that patient may forget to take insulin and needs close observation/reminders.

## 2019-08-23 NOTE — Progress Notes (Addendum)
PROGRESS NOTE    Jaclyn [name]  ZJI:[id number] DOB: 1935-05-09 DOA: 08/21/2019 PCP: Jinny Sanders, MD   Chief Complaint  Patient presents with  . Urinary Frequency  . Hyperglycemia    Brief Narrative:  84 yo F with hx of atrial fibrillation on eliquis, Carotid artery stenosis, fibromyalgia, TIA, T2DM, hypothyroidism, hypertension and multiple other medical problems who presents with L arm pain/weakness for 2 days in addition to confusion and concern for UTI symptoms.  She had MRI notable for Nicholl Onstott 6 mm acute ischemic nonhemorrhagic periventricular white matter infarct and has been admitted for stroke work up.   Assessment & Plan:   Principal Problem:   Acute CVA (cerebrovascular accident) ([place]) Active Problems:   Hypothyroidism   Poorly controlled type 2 diabetes mellitus with complication ([place])   PAROXYSMAL ATRIAL FIBRILLATION   Stenosis of right carotid artery   Mild dementia ([place])   CVA (cerebral vascular accident) ([place])  Acute Stroke  6 mm Acute Ischemic Nonhemorrhagic Periventricular White Matter Infarct Involving the Posterior R Frontal Corona radiata/Centrum Semi [place]vale:  MRI showing acute stroke CTA head/neck without LVO, but notable for chronic severe atherosclerosis, progressed since 2019 (severe stenosis or short segment occlusion of distal L vertebral artery V4 segment, diminutive calibar of basilar artery, radiographic string sign stenosis of proximal R ICA, chronic moderate to severe L ICA siphon stenosis, progressed L ICA supraclinoid stenosis, now mod to severe - stable proximal L 70% stenosis.  Progressed and severe bilateral PCA P1/P2 stenoses.  Progressed and moderate R MCA distal M1 stenosis.  Stable moderate bilateral proximal MCA stenoses.  Stable proximal L subclavian artery plaque with 50% stenosis. Neurology c/s, appreciate recs - neuro exam out of proportion to infarct, obtain EEG to r/o underlying seizures (normal EEG) - continue eliquis - follow carotid  dopplers - R carotid with 80-99% stenosis in R ICA, 40-59% in L ICA.  Vascular surgery has been consulted by neurology - appreciate assistance  Vascular c/s pending Cardiology c/s for preop eval -> transitioned to heparin/aspirin per cardiology - Coronary CTA pending Permissive hypertension lipitor 40 mg daily (plan to add CoQ10 outpatient, pt previously did not tolerate statin) PT/OT/SLP - > recommending CIR Echo with EF 65-70% (see report)  EEG normal   Acute Metabolic Encephalopathy  Delirium: suspect 2/2 above - improving today B12 wnl, tsh wnl Follow ammonia wnl UA not c/w UTI - son very concerned about this possibility, follow culture -> 30,000 multiple species Delirium precautions  Atrial Fibrillation: eliquis -> transitioned to heparin   T2DM: continue SSI and lantus, tradjenta  Hypothyroidism: synthroid  B12 deficiency: continue B12 supplementation  Recurrent UTI: continue prophylaxis  DVT prophylaxis: eliquis Code Status: full  Family Communication: son at bedside Disposition:   Status is: Inpatient  Remains inpatient appropriate because:Inpatient level of care appropriate due to severity of illness   Dispo: The patient is from: Home              Anticipated d/c is to: Home              Anticipated d/c date is: 1 day              Patient currently is not medically stable to d/c.  Consultants:   neurology  Procedures:  EEG IMPRESSION: Normal electroencephalogram, awake and drowsy. There are no focal lateralizing or epileptiform features.  Echo IMPRESSIONS    1. Left ventricular ejection fraction, by estimation, is 65 to 70%. The  left ventricle has normal function.  The left ventricle has no regional  wall motion abnormalities. There is moderate left ventricular hypertrophy.  Left ventricular diastolic  parameters are indeterminate.  2. Right ventricular systolic function is normal. The right ventricular  size is normal. Mildly increased right  ventricular wall thickness.  Tricuspid regurgitation signal is inadequate for assessing PA pressure.  3. The mitral valve is grossly normal, mild annular calcification.  Trivial mitral valve regurgitation.  4. The aortic valve is tricuspid, mild annular calcification. Aortic  valve regurgitation is not visualized.  5. The inferior vena cava is normal in size with greater than 50%  respiratory variability, suggesting right atrial pressure of 3 mmHg.   Antimicrobials: Anti-infectives (From admission, onward)   Start     Dose/Rate Route Frequency Ordered Stop   08/22/19 1615  trimethoprim (TRIMPEX) tablet 100 mg     Discontinue     100 mg Oral Daily 08/22/19 1614       Subjective: No new complaints, so thinks things are better  Objective: Vitals:   08/23/19 0358 08/23/19 0747 08/23/19 1218 08/23/19 1604  BP: (!) 132/57 (!) 122/59 119/66 (!) 115/52  Pulse: 68 75 72 76  Resp: 17 18 20 20   Temp: 97.7 F (36.5 C) 98.1 F (36.7 C) 98.1 F (36.7 C) 98.2 F (36.8 C)  TempSrc: Oral Oral Oral Oral  SpO2: 94% 97% 97% 99%  Weight:      Height:        Intake/Output Summary (Last 24 hours) at 08/23/2019 1604 Last data filed at 08/23/2019 0743 Gross per 24 hour  Intake --  Output 500 ml  Net -500 ml   Filed Weights   08/21/19 1857  Weight: 66 kg    Examination:  General: No acute distress. Cardiovascular: Heart sounds show Angelika Jerrett regular rate, and rhythm. Lungs: Clear to auscultation bilaterally  Abdomen: Soft, nontender, nondistended  Neurological: Alert. Moves all extremities 4.  4/5 strength to LUE (improved from yesterday). Cranial nerves II through XII grossly intact. Skin: Warm and dry. No rashes or lesions. Extremities: No clubbing or cyanosis. No edema.    Data Reviewed: I have personally reviewed following labs and imaging studies  CBC: Recent Labs  Lab 08/21/19 1939 08/22/19 1137 08/23/19 0425  WBC 6.7 7.2 5.5  NEUTROABS 4.3 4.6 3.5  HGB 11.4* 12.2 12.0  HCT  34.3* 36.0 36.2  MCV 90.0 88.9 88.1  PLT 197 217 893    Basic Metabolic Panel: Recent Labs  Lab 08/21/19 1939 08/22/19 1137 08/23/19 0425  NA 132* 139 142  K 3.9 3.6 3.0*  CL 101 107 108  CO2 21* 21* 23  GLUCOSE 360* 127* 165*  BUN 9 6* 5*  CREATININE 0.75 0.65 0.61  CALCIUM 8.4* 8.7* 8.8*  MG  --   --  1.4*  PHOS  --   --  3.8    GFR: Estimated Creatinine Clearance: 48.4 mL/min (by C-G formula based on SCr of 0.61 mg/dL).  Liver Function Tests: Recent Labs  Lab 08/21/19 1939 08/22/19 1137 08/23/19 0425  AST 20 19 20   ALT 18 16 16   ALKPHOS 62 60 57  BILITOT 1.1 0.6 1.0  PROT 5.8* 6.4* 6.1*  ALBUMIN 3.2* 3.4* 3.1*    CBG: Recent Labs  Lab 08/22/19 1234 08/22/19 1544 08/22/19 2138 08/23/19 0606 08/23/19 1219  GLUCAP 136* 165* 106* 215* 330*     Recent Results (from the past 240 hour(s))  Urine culture     Status: Abnormal   Collection Time: 08/21/19  8:44 PM   Specimen: Urine, Clean Catch  Result Value Ref Range Status   Specimen Description URINE, CLEAN CATCH  Final   Special Requests   Final    NONE Performed at [facility], [address], [place], [place] [address]    Culture (Keeyon Privitera)  Final    30,000 COLONIES/mL MULTIPLE SPECIES PRESENT, SUGGEST RECOLLECTION   Report Status 08/23/2019 FINAL  Final  SARS Coronavirus 2 by RT PCR (hospital order, performed in [facility] hospital lab) Nasopharyngeal Nasopharyngeal Swab     Status: None   Collection Time: 08/22/19  1:04 AM   Specimen: Nasopharyngeal Swab  Result Value Ref Range Status   SARS Coronavirus 2 NEGATIVE NEGATIVE Final    Comment: (NOTE) SARS-CoV-2 target nucleic acids are NOT DETECTED.  The SARS-CoV-2 RNA is generally detectable in upper and lower respiratory specimens during the acute phase of infection. The lowest concentration of SARS-CoV-2 viral copies this assay can detect is 250 copies / mL. Roy Tokarz negative result does not preclude SARS-CoV-2 infection and should not be used as  the sole basis for treatment or other patient management decisions.  Kaedyn Belardo negative result may occur with improper specimen collection / handling, submission of specimen other than nasopharyngeal swab, presence of viral mutation(s) within the areas targeted by this assay, and inadequate number of viral copies (<250 copies / mL). Denelle Capurro negative result must be combined with clinical observations, patient history, and epidemiological information.  Fact Sheet for Patients:   StrictlyIdeas.no  Fact Sheet for Healthcare Providers: BankingDealers.co.za  This test is not yet approved or  cleared by the [place] FDA and has been authorized for detection and/or diagnosis of SARS-CoV-2 by FDA under an Emergency Use Authorization (EUA).  This EUA will remain in effect (meaning this test can be used) for the duration of the COVID-19 declaration under Section 564(b)(1) of the Act, 21 U.S.C. section 360bbb-3(b)(1), unless the authorization is terminated or revoked sooner.  Performed at [facility], [place] [address] [place], [place] [address]          Radiology Studies: CT Head Wo Contrast  Result Date: 08/21/2019 CLINICAL DATA:  Neuro deficits. Subacute arm weakness. Increasingly confused since Friday. EXAM: CT HEAD WITHOUT CONTRAST TECHNIQUE: Contiguous axial images were obtained from the base of the skull through the vertex without intravenous contrast. COMPARISON:  01/24/2019 FINDINGS: Brain: Moderate central and cortical atrophy. Periventricular white matter changes are consistent with small vessel disease. Vascular: Dense atherosclerotic calcification of the internal carotid arteries. No hyperdense vessels. Skull: Normal. Negative for fracture or focal lesion. Sinuses/Orbits: No acute finding. Other: None. IMPRESSION: 1. No evidence for acute intracranial abnormality. 2. Atrophy and small vessel disease. Electronically Signed   By: Nolon Nations M.D.   On: 08/21/2019 20:58   MR BRAIN WO CONTRAST  Result Date: 08/21/2019 CLINICAL DATA:  Initial evaluation for increased confusion, focal neural deficit. EXAM: MRI HEAD WITHOUT CONTRAST TECHNIQUE: Multiplanar, multiecho pulse sequences of the brain and surrounding structures were obtained without intravenous contrast. COMPARISON:  Prior head CT from earlier the same day. FINDINGS: Brain: Examination mildly degraded by motion artifact. Generalized age-related cerebral atrophy. Patchy and confluent T2/FLAIR hyperintensity within the periventricular and deep white matter of both cerebral hemispheres most consistent with chronic small vessel ischemic disease, moderate in nature. Patchy involvement of the pons noted. 6 mm acute ischemic infarcts seen involving the periventricular white matter of the posterior right frontal corona radiata/centrum semi ovale (series 5, image 83). No  associated hemorrhage or mass effect. No other evidence for acute or subacute ischemia. Gray-white matter differentiation otherwise maintained. No encephalomalacia to suggest chronic cortical infarction. No acute intracranial hemorrhage. Single chronic microhemorrhage noted at the inferior cerebellum, likely hypertensive in nature. No other evidence for chronic intracranial hemorrhage. No mass lesion, midline shift or mass effect. Diffuse ventricular prominence most likely related to global parenchymal volume loss without hydrocephalus. No extra-axial fluid collection. Pituitary gland suprasellar region within normal limits. Midline structures intact. Vascular: Major intracranial vascular flow voids are maintained. Skull and upper cervical spine: Craniocervical junction within normal limits. Bone marrow signal intensity normal. No scalp soft tissue abnormality. Sinuses/Orbits: Patient status post ocular lens replacement on the left. Globes and orbital soft tissues demonstrate no acute finding. Mild scattered mucosal thickening  noted throughout the ethmoidal air cells and maxillary sinuses. Trace bilateral mastoid effusions noted, of doubtful significance. Visualized nasopharynx within normal limits. Other: None. IMPRESSION: 1. 6 mm acute ischemic nonhemorrhagic periventricular white matter infarct involving the posterior right frontal corona radiata/centrum semi ovale. 2. No other acute intracranial abnormality. 3. Age-related cerebral atrophy with moderate chronic microvascular ischemic disease. Electronically Signed   By: Jeannine Boga M.D.   On: 08/21/2019 23:44   EEG adult  Result Date: 08/22/2019 [name], MD     08/22/2019  3:21 PM ELECTROENCEPHALOGRAM REPORT Patient: Kember Boch       Room #: 1O10R EEG No. ID: [id number] Age: 84 y.o.        Sex: female Requesting Physician: Florene Glen Report Date:  08/22/2019       Interpreting Physician: [name] History: Sharie Amorin is an 84 y.o. female with altered mental status Medications: Eliquis, Lipitor, Insulin, Synthroid, Tradjenta Conditions of Recording:  This is Nyna Chilton 21 channel routine scalp EEG performed with bipolar and monopolar montages arranged in accordance to the international 10/20 system of electrode placement. One channel was dedicated to EKG recording. The patient is in the awake and drowsy states. Description:  The patient is uncooperative and muscle and movement artifact are prominent during the recording.  When Emmarie Sannes waking background activity can be evaluated it consists of Howard Bunte low voltage, symmetrical, fairly well organized, 8 Hz alpha activity, seen from the parieto-occipital and posterior temporal regions.  Low voltage fast activity, poorly organized, is seen anteriorly and is at times superimposed on more posterior regions.  Zakai Gonyea mixture of theta and alpha rhythms are seen from the central and temporal regions. The patient drowses with slowing to irregular, low voltage theta and beta activity.  Stage II sleep is not obtained. No epileptiform activity is  noted.  Hyperventilation and intermittent photic stimulation were not performed. IMPRESSION: Normal electroencephalogram, awake and drowsy. There are no focal lateralizing or epileptiform features. [name], MD Neurology [phone] 08/22/2019, 3:17 PM   ECHOCARDIOGRAM COMPLETE  Result Date: 08/22/2019    ECHOCARDIOGRAM REPORT   Patient Name:   ALYENE PREDMORE Date of Exam: 08/22/2019 Medical Rec #:  [id number]       Height:       62.0 in Accession #:    [id number]      Weight:       145.5 lb Date of Birth:  Dec 27, 1935       BSA:          1.670 m Patient Age:    31 years        BP:           127/66 mmHg Patient Gender: F  HR:           70 bpm. Exam Location:  Inpatient Procedure: 2D Echo, Color Doppler and Cardiac Doppler Indications:    Stroke i163.9  History:        Patient has prior history of Echocardiogram examinations, most                 recent 06/21/2017. Arrythmias:Atrial Fibrillation; Risk                 Factors:Hypertension, Diabetes and Dyslipidemia. COVID+ on                 01/15/19.  Sonographer:    [name] Senior RDCS Referring Phys: [id number] [place]  1. Left ventricular ejection fraction, by estimation, is 65 to 70%. The left ventricle has normal function. The left ventricle has no regional wall motion abnormalities. There is moderate left ventricular hypertrophy. Left ventricular diastolic parameters are indeterminate.  2. Right ventricular systolic function is normal. The right ventricular size is normal. Mildly increased right ventricular wall thickness. Tricuspid regurgitation signal is inadequate for assessing PA pressure.  3. The mitral valve is grossly normal, mild annular calcification. Trivial mitral valve regurgitation.  4. The aortic valve is tricuspid, mild annular calcification. Aortic valve regurgitation is not visualized.  5. The inferior vena cava is normal in size with greater than 50% respiratory variability, suggesting right atrial pressure of  3 mmHg. FINDINGS  Left Ventricle: Left ventricular ejection fraction, by estimation, is 65 to 70%. The left ventricle has normal function. The left ventricle has no regional wall motion abnormalities. The left ventricular internal cavity size was small. There is moderate  left ventricular hypertrophy. Left ventricular diastolic parameters are indeterminate. Right Ventricle: The right ventricular size is normal. Mildly increased right ventricular wall thickness. Right ventricular systolic function is normal. Tricuspid regurgitation signal is inadequate for assessing PA pressure. Left Atrium: Left atrial size was normal in size. Right Atrium: Right atrial size was normal in size. Pericardium: Trivial pericardial effusion is present. The pericardial effusion is posterior to the left ventricle. Presence of pericardial fat pad. Mitral Valve: The mitral valve is grossly normal. Mild mitral annular calcification. Trivial mitral valve regurgitation. Tricuspid Valve: The tricuspid valve is grossly normal. Tricuspid valve regurgitation is trivial. Aortic Valve: The aortic valve is tricuspid. Aortic valve regurgitation is not visualized. Mild aortic valve annular calcification. Pulmonic Valve: The pulmonic valve was grossly normal. Pulmonic valve regurgitation is trivial. Aorta: The aortic root is normal in size and structure. Venous: The inferior vena cava is normal in size with greater than 50% respiratory variability, suggesting right atrial pressure of 3 mmHg. IAS/Shunts: No atrial level shunt detected by color flow Doppler.  LEFT VENTRICLE PLAX 2D LVIDd:         2.60 cm  Diastology LVIDs:         1.60 cm  LV e' lateral:   4.90 cm/s LV PW:         1.50 cm  LV E/e' lateral: 15.6 LV IVS:        1.30 cm  LV e' medial:    5.66 cm/s LVOT diam:     1.60 cm  LV E/e' medial:  13.5 LV SV:         36 LV SV Index:   22 LVOT Area:     2.01 cm  RIGHT VENTRICLE RV S prime:     12.00 cm/s TAPSE (M-mode): 1.7 cm LEFT ATRIUM  Index       RIGHT ATRIUM           Index LA diam:        1.70 cm 1.02 cm/m  RA Area:     10.30 cm LA Vol (A2C):   28.0 ml 16.77 ml/m RA Volume:   22.40 ml  13.41 ml/m LA Vol (A4C):   27.4 ml 16.41 ml/m LA Biplane Vol: 28.5 ml 17.07 ml/m  AORTIC VALVE LVOT Vmax:   76.30 cm/s LVOT Vmean:  54.100 cm/s LVOT VTI:    0.179 m  AORTA Ao Root diam: 3.10 cm Ao Asc diam:  3.00 cm MITRAL VALVE MV Area (PHT): 1.91 cm    SHUNTS MV Decel Time: 398 msec    Systemic VTI:  0.18 m MV E velocity: 76.40 cm/s  Systemic Diam: 1.60 cm MV Jenisse Vullo velocity: 90.00 cm/s MV E/Philippe Gang ratio:  0.85 Rozann Lesches MD Electronically signed by Rozann Lesches MD Signature Date/Time: 08/22/2019/11:43:40 AM    Final    VAS US CAROTID  Result Date: 08/23/2019 Carotid Arterial Duplex Study Indications:       CVA. Risk Factors:      Hypertension, hyperlipidemia, Diabetes. Comparison Study:  06-20-2017 Carotid duplex RT ICA 60-79%, LT ICA 1-39% Performing Technologist: Darlin Coco  Examination Guidelines: Laila Myhre complete evaluation includes B-mode imaging, spectral Doppler, color Doppler, and power Doppler as needed of all accessible portions of each vessel. Bilateral testing is considered an integral part of Gracin Soohoo complete examination. Limited examinations for reoccurring indications may be performed as noted.  Right Carotid Findings: +----------+--------+--------+--------+------------------+--------+           PSV cm/sEDV cm/sStenosisPlaque DescriptionComments +----------+--------+--------+--------+------------------+--------+ CCA Prox  97      13              heterogenous               +----------+--------+--------+--------+------------------+--------+ CCA Distal154     19              heterogenous               +----------+--------+--------+--------+------------------+--------+ ICA Prox  375     121     80-99%  calcific                   +----------+--------+--------+--------+------------------+--------+ ICA Mid   154     36                                          +----------+--------+--------+--------+------------------+--------+ ICA Distal64      18                                         +----------+--------+--------+--------+------------------+--------+ ECA       510             >50%                               +----------+--------+--------+--------+------------------+--------+ +----------+--------+-------+----------------+-------------------+           PSV cm/sEDV cmsDescribe        Arm Pressure (mmHG) +----------+--------+-------+----------------+-------------------+ Subclavian129            Multiphasic, WNL                    +----------+--------+-------+----------------+-------------------+ +---------+--------+--+--------+-+---------+  VertebralPSV cm/s34EDV cm/s9Antegrade +---------+--------+--+--------+-+---------+  Left Carotid Findings: +----------+--------+--------+--------+------------------+--------+           PSV cm/sEDV cm/sStenosisPlaque DescriptionComments +----------+--------+--------+--------+------------------+--------+ CCA Prox  121     14              heterogenous               +----------+--------+--------+--------+------------------+--------+ CCA Distal143     20              heterogenous               +----------+--------+--------+--------+------------------+--------+ ICA Prox  133     48      40-59%  calcific                   +----------+--------+--------+--------+------------------+--------+ ICA Mid   119     28                                         +----------+--------+--------+--------+------------------+--------+ ICA Distal74      27                                         +----------+--------+--------+--------+------------------+--------+ ECA       188                                                +----------+--------+--------+--------+------------------+--------+  +----------+--------+--------+----------------+-------------------+           PSV cm/sEDV cm/sDescribe        Arm Pressure (mmHG) +----------+--------+--------+----------------+-------------------+ [id number]              Multiphasic, WNL                    +----------+--------+--------+----------------+-------------------+ +---------+--------+--+--------+-+---------+ VertebralPSV cm/s47EDV cm/s6Antegrade +---------+--------+--+--------+-+---------+   Summary: Right Carotid: Velocities in the right ICA are consistent with Daila Elbert 80-99%                stenosis. Non-hemodynamically significant plaque <50% noted in                the CCA. The ECA appears >50% stenosed. Left Carotid: Velocities in the left ICA are consistent with Aidan Caloca 40-59% stenosis.               Non-hemodynamically significant plaque <50% noted in the CCA. Vertebrals:  Bilateral vertebral arteries demonstrate antegrade flow. Subclavians: Normal flow hemodynamics were seen in bilateral subclavian              arteries. *See table(s) above for measurements and observations.     Preliminary    CT ANGIO HEAD CODE STROKE  Result Date: 08/22/2019 CLINICAL DATA:  84 year old female with Koury Roddy small acute right superior frontal white matter lacunar infarct after presenting with arm weakness and confusion. EXAM: CT ANGIOGRAPHY HEAD AND NECK TECHNIQUE: Multidetector CT imaging of the head and neck was performed using the standard protocol during bolus administration of intravenous contrast. Multiplanar CT image reconstructions and MIPs were obtained to evaluate the vascular anatomy. Carotid stenosis measurements (when applicable) are obtained utilizing NASCET criteria, using the distal internal carotid diameter as the denominator. CONTRAST:  28mL OMNIPAQUE IOHEXOL 350 MG/ML SOLN COMPARISON:  Brain MRI  and plain head CT 08/21/2019. CTA head and neck and CTP 06/20/2017. FINDINGS: CTA NECK Skeleton: Carious dentition. Chronic cervical spine  degeneration. No acute osseous abnormality identified. Upper chest: Chronic apical lung scarring is stable. No superior mediastinal lymphadenopathy. Unchanged borderline to mild circumferential wall thickening of the upper thoracic esophagus. Other neck: Negative. Aortic arch: 3 vessel arch configuration. Stable mild distal arch calcified plaque. Right carotid system: Negative brachiocephalic artery and proximal right CCA. Mild plaque proximal to the bifurcation without stenosis. Bulky chronic mostly calcified plaque at the right ICA origin and bulb resulting in radiographic string sign stenosis (series 5, image 94) over the initial 10 mm segment of the vessel. This appears mildly progressed. The right ICA remains patent and is otherwise negative to the skull base. Left carotid system: Minimal plaque at the left CCA origin without stenosis. Mild plaque proximal to the bifurcation without stenosis. Chronic bulky calcified plaque at the left ICA origin resulting in stenosis numerically estimated at 70% with respect to the distal vessel (series 9, image 126). This is stable. Left ICA is tortuous and remains patent distal to the stenosis. Vertebral arteries: Calcified proximal right subclavian artery plaque without stenosis. Dominant right vertebral artery is mildly ectatic without plaque or stenosis to the skull base. Moderate calcified plaque in the proximal left subclavian artery with up to 50% stenosis appears stable. Mild calcified plaque at the non dominant left vertebral artery origin not resulting in stenosis. The left vertebral remains non dominant to the skull base without stenosis. CTA HEAD Posterior circulation: Dominant right vertebral artery primarily supplies the basilar with minimal calcified plaque and no stenosis. Normal right PICA origin. Left V4 segment is non dominant with new severe stenosis or short segment occlusion just proximal to the vertebrobasilar junction on series 11, image 20. This is  about 3 mm in length. The basilar artery is patent but newly diminutive since 2019 with multifocal irregularity. Only mild focal basilar artery stenosis results. The SCA and PCA origins remain patent. Chronic bilateral PCA atherosclerosis has progressed now with severe bilateral P1 and P2 segment stenoses (series 11, images 17 and 18). Despite this there is distal bilateral PCA enhancement. Anterior circulation: Both ICA siphons are patent but heavily calcified. On the left there is severe supraclinoid stenosis on series 8, image 63, mildly progressed since 2019. On the right there is moderate to severe cavernous and proximal supraclinoid stenosis due to bulky calcified plaque which appears stable. Carotid termini remain patent. Mild-to-moderate bilateral chronic proximal MCA stenosis has not significantly changed. A1 segments remain normal. Mild bilateral ACA branch irregularity is stable. Distal right M1 stenosis is moderate and increased (series 10, image 16). Left MCA bifurcation remains patent, with stable left MCA branches demonstrating multifocal mild irregularity. Right MCA bifurcation remains patent with stable right MCA branches demonstrating multifocal mild irregularity. Venous sinuses: Patent. Anatomic variants: Dominant right vertebral artery. Review of the MIP images confirms the above findings IMPRESSION: 1. Negative for large vessel occlusion. 2. But positive for chronic severe atherosclerosis which has progressed since Arley Salamone 2019 CTA as follows: - New Severe stenosis or Short Segment Occlusion of the distal Left Vertebral Artery V4 segment. - New diminutive caliber of the Basilar Artery which remains patent. - RADIOGRAPHIC STRING SIGN stenosis of the proximal Right ICA has progressed. Chronic moderate to severe Left ICA siphon stenosis is stable. - progressed Left ICA supraclinoid stenosis which is now moderate to severe. Stable proximal left Left 70% stenosis. - progressed and Severe bilateral PCA  P1/P2 stenoses. - progressed and moderate Right MCA distal M1 stenosis. 3. Stable moderate bilateral proximal MCA stenoses. Stable proximal left subclavian artery plaque with 50% stenosis. Electronically Signed   By: Genevie Ann M.D.   On: 08/22/2019 11:59   CT ANGIO NECK CODE STROKE  Result Date: 08/22/2019 CLINICAL DATA:  84 year old female with Perris Tripathi small acute right superior frontal white matter lacunar infarct after presenting with arm weakness and confusion. EXAM: CT ANGIOGRAPHY HEAD AND NECK TECHNIQUE: Multidetector CT imaging of the head and neck was performed using the standard protocol during bolus administration of intravenous contrast. Multiplanar CT image reconstructions and MIPs were obtained to evaluate the vascular anatomy. Carotid stenosis measurements (when applicable) are obtained utilizing NASCET criteria, using the distal internal carotid diameter as the denominator. CONTRAST:  1mL OMNIPAQUE IOHEXOL 350 MG/ML SOLN COMPARISON:  Brain MRI and plain head CT 08/21/2019. CTA head and neck and CTP 06/20/2017. FINDINGS: CTA NECK Skeleton: Carious dentition. Chronic cervical spine degeneration. No acute osseous abnormality identified. Upper chest: Chronic apical lung scarring is stable. No superior mediastinal lymphadenopathy. Unchanged borderline to mild circumferential wall thickening of the upper thoracic esophagus. Other neck: Negative. Aortic arch: 3 vessel arch configuration. Stable mild distal arch calcified plaque. Right carotid system: Negative brachiocephalic artery and proximal right CCA. Mild plaque proximal to the bifurcation without stenosis. Bulky chronic mostly calcified plaque at the right ICA origin and bulb resulting in radiographic string sign stenosis (series 5, image 94) over the initial 10 mm segment of the vessel. This appears mildly progressed. The right ICA remains patent and is otherwise negative to the skull base. Left carotid system: Minimal plaque at the left CCA origin  without stenosis. Mild plaque proximal to the bifurcation without stenosis. Chronic bulky calcified plaque at the left ICA origin resulting in stenosis numerically estimated at 70% with respect to the distal vessel (series 9, image 126). This is stable. Left ICA is tortuous and remains patent distal to the stenosis. Vertebral arteries: Calcified proximal right subclavian artery plaque without stenosis. Dominant right vertebral artery is mildly ectatic without plaque or stenosis to the skull base. Moderate calcified plaque in the proximal left subclavian artery with up to 50% stenosis appears stable. Mild calcified plaque at the non dominant left vertebral artery origin not resulting in stenosis. The left vertebral remains non dominant to the skull base without stenosis. CTA HEAD Posterior circulation: Dominant right vertebral artery primarily supplies the basilar with minimal calcified plaque and no stenosis. Normal right PICA origin. Left V4 segment is non dominant with new severe stenosis or short segment occlusion just proximal to the vertebrobasilar junction on series 11, image 20. This is about 3 mm in length. The basilar artery is patent but newly diminutive since 2019 with multifocal irregularity. Only mild focal basilar artery stenosis results. The SCA and PCA origins remain patent. Chronic bilateral PCA atherosclerosis has progressed now with severe bilateral P1 and P2 segment stenoses (series 11, images 17 and 18). Despite this there is distal bilateral PCA enhancement. Anterior circulation: Both ICA siphons are patent but heavily calcified. On the left there is severe supraclinoid stenosis on series 8, image 63, mildly progressed since 2019. On the right there is moderate to severe cavernous and proximal supraclinoid stenosis due to bulky calcified plaque which appears stable. Carotid termini remain patent. Mild-to-moderate bilateral chronic proximal MCA stenosis has not significantly changed. A1 segments  remain normal. Mild bilateral ACA branch irregularity is stable. Distal right M1 stenosis is moderate and increased (  series 10, image 16). Left MCA bifurcation remains patent, with stable left MCA branches demonstrating multifocal mild irregularity. Right MCA bifurcation remains patent with stable right MCA branches demonstrating multifocal mild irregularity. Venous sinuses: Patent. Anatomic variants: Dominant right vertebral artery. Review of the MIP images confirms the above findings IMPRESSION: 1. Negative for large vessel occlusion. 2. But positive for chronic severe atherosclerosis which has progressed since Sarp Vernier 2019 CTA as follows: - New Severe stenosis or Short Segment Occlusion of the distal Left Vertebral Artery V4 segment. - New diminutive caliber of the Basilar Artery which remains patent. - RADIOGRAPHIC STRING SIGN stenosis of the proximal Right ICA has progressed. Chronic moderate to severe Left ICA siphon stenosis is stable. - progressed Left ICA supraclinoid stenosis which is now moderate to severe. Stable proximal left Left 70% stenosis. - progressed and Severe bilateral PCA P1/P2 stenoses. - progressed and moderate Right MCA distal M1 stenosis. 3. Stable moderate bilateral proximal MCA stenoses. Stable proximal left subclavian artery plaque with 50% stenosis. Electronically Signed   By: Genevie Ann M.D.   On: 08/22/2019 11:59        Scheduled Meds: . aspirin EC  81 mg Oral Daily  . atorvastatin  10 mg Oral Daily  . insulin aspart  0-15 Units Subcutaneous TID WC  . insulin aspart  0-5 Units Subcutaneous QHS  . insulin glargine  65 Units Subcutaneous Daily  . levothyroxine  112 mcg Oral Daily  . linagliptin  5 mg Oral Daily  . trimethoprim  100 mg Oral Daily  . vitamin B-12  1,000 mcg Oral Daily   Continuous Infusions: . heparin       LOS: 1 day    Time spent: over 30 min    Fayrene Helper, MD [facility]   To contact the attending provider between 7A-7P or the  covering provider during after hours 7P-7A, please log into the web site [email or web address] and access using universal Echo password for that web site. If you do not have the password, please call the hospital operator.  08/23/2019, 4:04 PM

## 2019-08-24 ENCOUNTER — Inpatient Hospital Stay (HOSPITAL_COMMUNITY): Payer: Medicare Other

## 2019-08-24 DIAGNOSIS — Z01810 Encounter for preprocedural cardiovascular examination: Secondary | ICD-10-CM

## 2019-08-24 DIAGNOSIS — I63239 Cerebral infarction due to unspecified occlusion or stenosis of unspecified carotid arteries: Secondary | ICD-10-CM

## 2019-08-24 LAB — GLUCOSE, CAPILLARY
Glucose-Capillary: 322 mg/dL — ABNORMAL HIGH (ref 70–99)
Glucose-Capillary: 189 mg/dL — ABNORMAL HIGH (ref 70–99)
Glucose-Capillary: 89 mg/dL (ref 70–99)
Glucose-Capillary: 190 mg/dL — ABNORMAL HIGH (ref 70–99)

## 2019-08-24 LAB — COMPREHENSIVE METABOLIC PANEL
ALT: 17 U/L (ref 0–44)
AST: 21 U/L (ref 15–41)
Albumin: 3 g/dL — ABNORMAL LOW (ref 3.5–5.0)
Alkaline Phosphatase: 63 U/L (ref 38–126)
Anion gap: 10 (ref 5–15)
BUN: 10 mg/dL (ref 8–23)
CO2: 21 mmol/L — ABNORMAL LOW (ref 22–32)
Calcium: 8.8 mg/dL — ABNORMAL LOW (ref 8.9–10.3)
Chloride: 109 mmol/L (ref 98–111)
Creatinine, Ser: 0.85 mg/dL (ref 0.44–1.00)
GFR calc Af Amer: >60 mL/min (ref >60)
GFR calc non Af Amer: >60 mL/min (ref >60)
Glucose, Bld: 318 mg/dL — ABNORMAL HIGH (ref 70–99)
Potassium: 4.2 mmol/L (ref 3.5–5.1)
Sodium: 140 mmol/L (ref 135–145)
Total Bilirubin: 0.6 mg/dL (ref 0.3–1.2)
Total Protein: 6 g/dL — ABNORMAL LOW (ref 6.5–8.1)

## 2019-08-24 LAB — CBC
HCT: 34.9 % — ABNORMAL LOW (ref 36.0–46.0)
Hemoglobin: 11.6 g/dL — ABNORMAL LOW (ref 12.0–15.0)
MCH: 29.8 pg (ref 26.0–34.0)
MCHC: 33.2 g/dL (ref 30.0–36.0)
MCV: 89.7 fL (ref 80.0–100.0)
Platelets: 206 10*3/uL (ref 150–400)
RBC: 3.89 MIL/uL (ref 3.87–5.11)
RDW: 14.6 % (ref 11.5–15.5)
WBC: 5.1 10*3/uL (ref 4.0–10.5)
nRBC: 0 % (ref 0.0–0.2)

## 2019-08-24 LAB — PHOSPHORUS: Phosphorus: 3.2 mg/dL (ref 2.5–4.6)

## 2019-08-24 LAB — APTT: aPTT: 75 seconds — ABNORMAL HIGH (ref 24–36)

## 2019-08-24 LAB — MAGNESIUM: Magnesium: 1.6 mg/dL — ABNORMAL LOW (ref 1.7–2.4)

## 2019-08-24 LAB — HEPARIN LEVEL (UNFRACTIONATED): Heparin Unfractionated: 1.6 IU/mL — ABNORMAL HIGH (ref 0.30–0.70)

## 2019-08-24 MED ORDER — ONDANSETRON HCL 4 MG/2ML IJ SOLN: 4.0000 mg | Freq: Four times a day (QID) | INTRAMUSCULAR | Status: DC | PRN

## 2019-08-24 MED ORDER — NITROGLYCERIN 0.4 MG SL SUBL: 0.4000 mg | SUBLINGUAL_TABLET | Freq: Once | SUBLINGUAL | Status: AC

## 2019-08-24 MED ORDER — MAGNESIUM SULFATE 2 GM/50ML IV SOLN
2.0000 g | Freq: Once | INTRAVENOUS | Status: AC
  Administered 2019-08-24: 2 g via INTRAVENOUS
  Filled 2019-08-24: qty 50

## 2019-08-24 MED ORDER — NITROGLYCERIN 0.4 MG SL SUBL
SUBLINGUAL_TABLET | SUBLINGUAL | Status: AC
  Administered 2019-08-24: 0.4 mg via SUBLINGUAL
  Filled 2019-08-24: qty 1

## 2019-08-24 MED ORDER — IOHEXOL 350 MG/ML SOLN
80.0000 mL | Freq: Once | INTRAVENOUS | Status: AC | PRN
  Administered 2019-08-24: 80 mL via INTRAVENOUS

## 2019-08-24 MED ORDER — SODIUM CHLORIDE 0.9 % IV SOLN: INTRAVENOUS | Status: AC

## 2019-08-24 MED ORDER — INSULIN DETEMIR 100 UNIT/ML ~~LOC~~ SOLN
35.0000 [IU] | Freq: Two times a day (BID) | SUBCUTANEOUS | Status: DC
  Administered 2019-08-24 – 2019-08-27 (×5): 35 [IU] via SUBCUTANEOUS
  Filled 2019-08-24 (×10): qty 0.35

## 2019-08-24 NOTE — Progress Notes (Signed)
Inpatient Diabetes Program Recommendations  AACE/ADA: New Consensus Statement on Inpatient Glycemic Control (2015)  Target Ranges:  Prepandial:   less than 140 mg/dL      Peak postprandial:   less than 180 mg/dL (1-2 hours)      Critically ill patients:  140 - 180 mg/dL   Lab Results  Component Value Date   GLUCAP 189 (H) 08/24/2019   HGBA1C 14.2 (H) 08/22/2019    Review of Glycemic Control Results for AZIYA, ARENA (MRN [id number]) as of 08/24/2019 12:05  Ref. Range 08/23/2019 16:06 08/23/2019 22:12 08/24/2019 06:21 08/24/2019 11:45  Glucose-Capillary Latest Ref Range: 70 - 99 mg/dL 282 (H) 213 (H) 322 (H) 189 (H)  Diabetes history: DM 2 Outpatient Diabetes medications:  Lantus 65 units daily, Januvia 100 mg daily Current orders for Inpatient glycemic control:  Novolog moderate tid with meals and HS Lantus 65 units daily Novolog 4 units tid with meals-meal coverage  Inpatient Diabetes Program Recommendations:    Consider d/c of Lantus and add Levemir 35 units bid starting 08/25/19.   Thanks  Adah Perl, RN, BC-ADM Inpatient Diabetes Coordinator Pager [phone]  (8a-5p)

## 2019-08-24 NOTE — Progress Notes (Signed)
   VASCULAR SURGERY ASSESSMENT & PLAN:   SYMPTOMATIC RIGHT CAROTID STENOSIS: The patient seems more confused this morning.  Still with significant left-sided weakness.  I am not sure that she will be ready by Friday from a neurologic standpoint.  I welcome neurology's input.   ANTICOAGULATION: Her Eliquis is on hold and she is on IV heparin. (Afib)  CARDIAC: Appreciate Dr. Francesca Oman consult.  She is scheduled for coronary CTA.   SUBJECTIVE:   She seems more confused this morning.  PHYSICAL EXAM:   Vitals:   08/23/19 1604 08/23/19 2000 08/24/19 0000 08/24/19 0400  BP: (!) 115/52 120/60 (!) 119/51 (!) 135/57  Pulse: 76 75 75 77  Resp: 20     Temp: 98.2 F (36.8 C) 98.5 F (36.9 C) 98.5 F (36.9 C) 98.6 F (37 C)  TempSrc: Oral Oral Oral Oral  SpO2: 99% 95% 95% 96%  Weight:      Height:       Still with significant left-sided weakness especially in the upper extremity.  LABS:   Lab Results  Component Value Date   WBC 5.5 08/23/2019   HGB 12.0 08/23/2019   HCT 36.2 08/23/2019   MCV 88.1 08/23/2019   PLT 204 08/23/2019   Lab Results  Component Value Date   CREATININE 0.61 08/23/2019   Lab Results  Component Value Date   INR 1.4 (H) 01/24/2019   CBG (last 3)  Recent Labs    08/23/19 1606 08/23/19 2212 08/24/19 0621  GLUCAP 282* 213* 322*    PROBLEM LIST:    Principal Problem:   Acute CVA (cerebrovascular accident) ([place]) Active Problems:   Hypothyroidism   Poorly controlled type 2 diabetes mellitus with complication ([place])   PAROXYSMAL ATRIAL FIBRILLATION   Stenosis of right carotid artery   Mild dementia ([place])   CVA (cerebral vascular accident) ([place])   CURRENT MEDS:   . aspirin EC  81 mg Oral Daily  . atorvastatin  40 mg Oral Daily  . insulin aspart  0-15 Units Subcutaneous TID WC  . insulin aspart  0-5 Units Subcutaneous QHS  . insulin aspart  4 Units Subcutaneous TID WC  . insulin glargine  65 Units Subcutaneous Daily  . levothyroxine  112 mcg  Oral Daily  . linagliptin  5 mg Oral Daily  . metoprolol tartrate  50 mg Oral BID  . trimethoprim  100 mg Oral Daily  . vitamin B-12  1,000 mcg Oral Daily    Deitra Mayo Office: [phone] 08/24/2019

## 2019-08-24 NOTE — Progress Notes (Signed)
PROGRESS NOTE    Devanshi Califf  DPO:[id number] DOB: February 24, 1935 DOA: 08/21/2019 PCP: Jinny Sanders, MD      Brief Narrative:  Mrs. Weilbacher is a 84 y.o. F with hx A. fib on Eliquis, carotid stenosis, fibromyalgia, TIA, diabetes, hypothyroidism, HTN who presented with 2 days left-sided weakness as well as confusion.  In the ER, MRI showed acute ischemic stroke.       Assessment & Plan:  Acute ischemic stroke MRI showed subcentimeter right corona radiata stroke. -Non-invasive angiography showed carotid disease -Vascular surgery have been consulted for CEA -Echocardiogram showed no cardiogenic source of embolism   -Lipids ordered: Continue atorvastatin -Aspirin ordered at admission --> continue aspirin -Atrial fibrillation: Previously known, continue on full dose anticoagulant -tPA not given because outside the stroke window -Dysphagia screen ordered in ER -PT eval ordered: recommended CIR -Smoking cessation: Not pertinent    Delirium Patient with some MCI prior to admission, now with stroke.  Delirium not unexpected. Delirium precautions:   -Lights and TV off, minimize interruptions at night  -Blinds open and lights on during day  -Glasses/hearing aid with patient  -Frequent reorientation  -PT/OT when able  -Avoid sedation medications/Beers list medications    Permanent atrial fibrillation -Hold home Eliquis -Continue heparin drip  Type 2 diabetes, insulin-dependent, no complications Blood sugars have been elevated, they are getting under better control -Continue Levemir, changed to twice daily dosing -Continue sliding scale corrections -Continue linagliptin   Hypothyroidism -Continue levothyroxine  B12 deficiency -Continue B12   History of recurrent UTI -Continue trimethoprim  Fibromyalgia Not on treatment  Hypertension  Hypomagnesemia -Replete Mag          Disposition: Status is: Inpatient  Remains inpatient appropriate  because:Ongoing diagnostic testing needed not appropriate for outpatient work up   Dispo: The patient is from: Home              Anticipated d/c is to: SNF              Anticipated d/c date is: > 3 days              Patient currently is not medically stable to d/c.              MDM: The below labs and imaging reports were reviewed and summarized above.  Medication management as above.  DVT prophylaxis: Not applicable, on heparin  Code Status: Full code Family Communication: Son at the bedside    Consultants:   Neurology  Vascular surgery  Procedures:     Antimicrobials:      Culture data:              Subjective: Patient is feeling well.  She still has some left-sided weakness.  She has no headache, chest pain, trouble breathing.  She is somewhat confused.  No fever.  Objective: Vitals:   08/24/19 0400 08/24/19 0800 08/24/19 1223 08/24/19 1655  BP: (!) 135/57 129/72 (!) 108/49 112/85  Pulse: 77 75    Resp:  17 13   Temp: 98.6 F (37 C) 98.4 F (36.9 C) 98.4 F (36.9 C) 98.6 F (37 C)  TempSrc: Oral Oral Oral Oral  SpO2: 96% 95% 97% 97%  Weight:      Height:        Intake/Output Summary (Last 24 hours) at 08/24/2019 1944 Last data filed at 08/24/2019 4315 Gross per 24 hour  Intake 0 ml  Output 225 ml  Net -225 ml   [employer]  08/21/19 1857  Weight: 66 kg    Examination: General appearance:  adult female, alert and in no acute distress.   HEENT: Anicteric, conjunctiva pink, lids and lashes normal. No nasal deformity, discharge, epistaxis.  Lips moist, partially edentulous, oropharynx moist, no oral lesions.   Skin: Warm and dry.  No jaundice.  No suspicious rashes or lesions. Cardiac: RRR, nl S1-S2, no murmurs appreciated.  Capillary refill is brisk.  JVP normal.  No LE edema.  Radial pulses 2+ and symmetric. Respiratory: Normal respiratory rate and rhythm.  CTAB without rales or wheezes. Abdomen: Abdomen soft.  No TTP or  guarding. No ascites, distension, hepatosplenomegaly.   MSK: No deformities or effusions. Neuro: Awake and alert.  EOMI, some moderate left-sided weakness noted.  Speech fluent.    Psych: Sensorium intact and responding to questions, attention normal. Affect normal.  Judgment and insight appear impaired    Data Reviewed: I have personally reviewed following labs and imaging studies:  CBC: Recent Labs  Lab 08/21/19 1939 08/22/19 1137 08/23/19 0425 08/24/19 0741  WBC 6.7 7.2 5.5 5.1  NEUTROABS 4.3 4.6 3.5  --   HGB 11.4* 12.2 12.0 11.6*  HCT 34.3* 36.0 36.2 34.9*  MCV 90.0 88.9 88.1 89.7  PLT 197 217 204 601   Basic Metabolic Panel: Recent Labs  Lab 08/21/19 1939 08/22/19 1137 08/23/19 0425 08/24/19 0741  NA 132* 139 142 140  K 3.9 3.6 3.0* 4.2  CL 101 107 108 109  CO2 21* 21* 23 21*  GLUCOSE 360* 127* 165* 318*  BUN 9 6* 5* 10  CREATININE 0.75 0.65 0.61 0.85  CALCIUM 8.4* 8.7* 8.8* 8.8*  MG  --   --  1.4* 1.6*  PHOS  --   --  3.8 3.2   GFR: Estimated Creatinine Clearance: 45.5 mL/min (by C-G formula based on SCr of 0.85 mg/dL). Liver Function Tests: Recent Labs  Lab 08/21/19 1939 08/22/19 1137 08/23/19 0425 08/24/19 0741  AST 20 19 20 21   ALT 18 16 16 17   ALKPHOS 62 60 57 63  BILITOT 1.1 0.6 1.0 0.6  PROT 5.8* 6.4* 6.1* 6.0*  ALBUMIN 3.2* 3.4* 3.1* 3.0*   No results for input(s): LIPASE, AMYLASE in the last 168 hours. Recent Labs  Lab 08/22/19 1629  AMMONIA 26   Coagulation Profile: No results for input(s): INR, PROTIME in the last 168 hours. Cardiac Enzymes: No results for input(s): CKTOTAL, CKMB, CKMBINDEX, TROPONINI in the last 168 hours. BNP (last 3 results) No results for input(s): PROBNP in the last 8760 hours. HbA1C: Recent Labs    08/22/19 0459  HGBA1C 14.2*   CBG: Recent Labs  Lab 08/23/19 1606 08/23/19 2212 08/24/19 0621 08/24/19 1145 08/24/19 1700  GLUCAP 282* 213* 322* 189* 89   Lipid Profile: Recent Labs     08/22/19 0459  CHOL 204*  HDL 33*  LDLCALC 103*  TRIG 338*  CHOLHDL 6.2   Thyroid Function Tests: Recent Labs    08/22/19 1137  TSH 2.876   Anemia Panel: Recent Labs    08/22/19 1137  VITAMINB12 733   Urine analysis:    Component Value Date/Time   COLORURINE YELLOW 08/21/2019 2040   APPEARANCEUR CLEAR 08/21/2019 2040   LABSPEC 1.027 08/21/2019 2040   PHURINE 5.0 08/21/2019 2040   GLUCOSEU >=500 (A) 08/21/2019 2040   GLUCOSEU >=1000 (A) 03/16/2019 0915   HGBUR NEGATIVE 08/21/2019 2040   BILIRUBINUR NEGATIVE 08/21/2019 2040   BILIRUBINUR negative 12/18/2017 1613   KETONESUR 20 (A) 08/21/2019  2040   PROTEINUR NEGATIVE 08/21/2019 2040   UROBILINOGEN 0.2 03/16/2019 0915   NITRITE NEGATIVE 08/21/2019 2040   LEUKOCYTESUR NEGATIVE 08/21/2019 2040   Sepsis Labs: @LABRCNTIP (procalcitonin:4,lacticacidven:4)  ) Recent Results (from the past 240 hour(s))  Urine culture     Status: Abnormal   Collection Time: 08/21/19  8:44 PM   Specimen: Urine, Clean Catch  Result Value Ref Range Status   Specimen Description URINE, CLEAN CATCH  Final   Special Requests   Final    NONE Performed at [facility], [place] [address] [place], [place] [address]    Culture (A)  Final    30,000 COLONIES/mL MULTIPLE SPECIES PRESENT, SUGGEST RECOLLECTION   Report Status 08/23/2019 FINAL  Final  SARS Coronavirus 2 by RT PCR (hospital order, performed in [facility] hospital lab) Nasopharyngeal Nasopharyngeal Swab     Status: None   Collection Time: 08/22/19  1:04 AM   Specimen: Nasopharyngeal Swab  Result Value Ref Range Status   SARS Coronavirus 2 NEGATIVE NEGATIVE Final    Comment: (NOTE) SARS-CoV-2 target nucleic acids are NOT DETECTED.  The SARS-CoV-2 RNA is generally detectable in upper and lower respiratory specimens during the acute phase of infection. The lowest concentration of SARS-CoV-2 viral copies this assay can detect is 250 copies / mL. A negative result does not preclude  SARS-CoV-2 infection and should not be used as the sole basis for treatment or other patient management decisions.  A negative result may occur with improper specimen collection / handling, submission of specimen other than nasopharyngeal swab, presence of viral mutation(s) within the areas targeted by this assay, and inadequate number of viral copies (<250 copies / mL). A negative result must be combined with clinical observations, patient history, and epidemiological information.  Fact Sheet for Patients:   StrictlyIdeas.no  Fact Sheet for Healthcare Providers: BankingDealers.co.za  This test is not yet approved or  cleared by the [place] FDA and has been authorized for detection and/or diagnosis of SARS-CoV-2 by FDA under an Emergency Use Authorization (EUA).  This EUA will remain in effect (meaning this test can be used) for the duration of the COVID-19 declaration under Section 564(b)(1) of the Act, 21 U.S.C. section 360bbb-3(b)(1), unless the authorization is terminated or revoked sooner.  Performed at [facility], [place] [address] [place], [place] [address]          Radiology Studies: CT CORONARY MORPH W/CTA COR W/SCORE W/CA W/CM &/OR WO/CM  Addendum Date: 08/24/2019   ADDENDUM REPORT: 08/24/2019 19:19 CLINICAL DATA:  84 year old female with h/o symptomatic carotid stenosis scheduled for an endarterectomy. EXAM: Cardiac/Coronary  CTA TECHNIQUE: The patient was scanned on a [employer]. FINDINGS: A 100 kV prospective scan was triggered in the descending thoracic aorta at 111 HU's. Axial non-contrast 3 mm slices were carried out through the heart. The data set was analyzed on a dedicated work station and scored using the Braman. Gantry rotation speed was 250 msecs and collimation was .6 mm. 50 mg of PO Metoprolol and 0.8 mg of sl NTG was given. The 3D data set was reconstructed in 5% intervals of the  67-82 % of the R-R cycle. Diastolic phases were analyzed on a dedicated work station using MPR, MIP and VRT modes. The patient received 80 cc of contrast. Aorta: Normal size. Moderate diffuse atherosclerotic disease and calcifications. No dissection. Aortic Valve: Trileaflet with minimal calcifications. Coronary Arteries:  Normal coronary origin.  Right dominance. RCA is a medium caliber  dominant artery that gives rise to PDA and PLA. There is mild calcified plaque in the proximal and mid portion with stenosis 25-49%. Distal RCA, PDA and PLA have minimal plaque. Left main is a large artery that gives rise to LAD and LCX arteries. Left main has a moderate calcified plaque in the distal portion with stenosis 50-69%. LAD is a large vessel that gives rise to one small diagonal artery. Proximal LAD has moderate mixed plaque with stenosis 50-69%. Mid and distal LAD are affected by motion. D1 is a small artery affected by motion. LCX is a small lumen non-dominant artery that gives rise to one small OM1 branch. OM1 has moderate calcified plaque in the proximal portion with stenosis 50-69%. Other findings: Normal pulmonary vein drainage into the left atrium. Normal left atrial appendage without a thrombus. Normal size of the pulmonary artery. IMPRESSION: 1. Coronary calcium score of 663. This was 26 percentile for age and sex matched control. 2. Normal coronary origin with right dominance. 3. CAD-RADS 3. Moderate stenosis. Study interpretation is affected by motion and poor vasodilation. There is moderate plaque in the distal left main and proximal LAD. Aggressive medical management is recommended. Additional analysis with CT FFR will be submitted. Electronically Signed   By: Ena Dawley   On: 08/24/2019 19:19   Result Date: 08/24/2019 EXAM: OVER-READ INTERPRETATION  CT CHEST The following report is an over-read performed by radiologist Dr. Suzy Bouchard of [facility], [place] on 08/24/2019. This over-read does  not include interpretation of cardiac or coronary anatomy or pathology. The coronary CTA interpretation by the cardiologist is attached. COMPARISON:  None. FINDINGS: Limited view of the lung parenchyma demonstrates no suspicious nodularity. Airways are normal. Limited view of the mediastinum demonstrates no adenopathy. Esophagus normal. Limited view of the upper abdomen unremarkable. Limited view of the skeleton and chest wall is unremarkable. IMPRESSION: No significant extracardiac findings. Electronically Signed: By: Suzy Bouchard M.D. On: 08/24/2019 14:24   VAS US CAROTID  Result Date: 08/24/2019 Carotid Arterial Duplex Study Indications:       CVA. Risk Factors:      Hypertension, hyperlipidemia, Diabetes. Comparison Study:  06-20-2017 Carotid duplex RT ICA 60-79%, LT ICA 1-39% Performing Technologist: Darlin Coco  Examination Guidelines: A complete evaluation includes B-mode imaging, spectral Doppler, color Doppler, and power Doppler as needed of all accessible portions of each vessel. Bilateral testing is considered an integral part of a complete examination. Limited examinations for reoccurring indications may be performed as noted.  Right Carotid Findings: +----------+--------+--------+--------+------------------+--------+             PSV cm/s EDV cm/s Stenosis Plaque Description Comments  +----------+--------+--------+--------+------------------+--------+  CCA Prox   97       13                heterogenous                 +----------+--------+--------+--------+------------------+--------+  CCA Distal 154      19                heterogenous                 +----------+--------+--------+--------+------------------+--------+  ICA Prox   375      121      80-99%   calcific                     +----------+--------+--------+--------+------------------+--------+  ICA Mid    154      36                                             +----------+--------+--------+--------+------------------+--------+  ICA  Distal 64       18                                             +----------+--------+--------+--------+------------------+--------+  ECA        510               >50%                                  +----------+--------+--------+--------+------------------+--------+ +----------+--------+-------+----------------+-------------------+             PSV cm/s EDV cms Describe         Arm Pressure (mmHG)  +----------+--------+-------+----------------+-------------------+  Subclavian 129              Multiphasic, WNL                      +----------+--------+-------+----------------+-------------------+ +---------+--------+--+--------+-+---------+  Vertebral PSV cm/s 34 EDV cm/s 9 Antegrade  +---------+--------+--+--------+-+---------+  Left Carotid Findings: +----------+--------+--------+--------+------------------+--------+             PSV cm/s EDV cm/s Stenosis Plaque Description Comments  +----------+--------+--------+--------+------------------+--------+  CCA Prox   121      14                heterogenous                 +----------+--------+--------+--------+------------------+--------+  CCA Distal 143      20                heterogenous                 +----------+--------+--------+--------+------------------+--------+  ICA Prox   133      48       40-59%   calcific                     +----------+--------+--------+--------+------------------+--------+  ICA Mid    119      28                                             +----------+--------+--------+--------+------------------+--------+  ICA Distal 74       27                                             +----------+--------+--------+--------+------------------+--------+  ECA        188                                                     +----------+--------+--------+--------+------------------+--------+ +----------+--------+--------+----------------+-------------------+             PSV cm/s EDV cm/s Describe         Arm Pressure (mmHG)   +----------+--------+--------+----------------+-------------------+  Subclavian 92                Multiphasic, WNL                      +----------+--------+--------+----------------+-------------------+ +---------+--------+--+--------+-+---------+  Vertebral PSV cm/s 47 EDV cm/s 6 Antegrade  +---------+--------+--+--------+-+---------+   Summary: Right Carotid: Velocities in the right ICA are consistent with a 80-99%                stenosis. Non-hemodynamically significant plaque <50% noted in                the CCA. The ECA appears >50% stenosed. Left Carotid: Velocities in the left ICA are consistent with a 40-59% stenosis.               Non-hemodynamically significant plaque <50% noted in the CCA. Vertebrals:  Bilateral vertebral arteries demonstrate antegrade flow. Subclavians: Normal flow hemodynamics were seen in bilateral subclavian              arteries. *See table(s) above for measurements and observations.  Electronically signed by Antony Contras MD on 08/24/2019 at 12:53:57 PM.    Final         Scheduled Meds:  aspirin EC  81 mg Oral Daily   atorvastatin  40 mg Oral Daily   insulin aspart  0-15 Units Subcutaneous TID WC   insulin aspart  0-5 Units Subcutaneous QHS   insulin aspart  4 Units Subcutaneous TID WC   insulin detemir  35 Units Subcutaneous BID   levothyroxine  112 mcg Oral Daily   linagliptin  5 mg Oral Daily   metoprolol tartrate  50 mg Oral BID   trimethoprim  100 mg Oral Daily   vitamin B-12  1,000 mcg Oral Daily   Continuous Infusions:  heparin 900 Units/hr (08/23/19 2230)     LOS: 2 days    Time spent: 25 minutes    Edwin Dada, MD [facility] 08/24/2019, 7:44 PM     Please page though [place] or Epic secure chat:  For [employer], Adult nurse

## 2019-08-24 NOTE — Progress Notes (Signed)
Physical Therapy Treatment Patient Details Name: Jaclyn [name] MRN: [id number] DOB: May 11, 1935 Today's Date: 08/24/2019    History of Present Illness Patient is a 84 y/o female who presents with AMS and LUE weakness s/p fall at home. Found to have urinary frequency and hyperglycemia. 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 PMH includes COVID, CVA, dementia, HTN, hypothyroidism, recurrent UTI.    PT Comments    Pt with improved ability to tolerate ambulation however is not near baseline level of ambulation with cane. Trialed 1 and 2 person hand held assist however pt unsteady and with frequent starring episodes. Will trial RW next however unsure if pt will use L UE functional due to inattention and weakness. Acute PT to cont to follow. Continue to recommend CIR upon d/c to achieve maximal functional recovery.    Follow Up Recommendations  CIR;Supervision for mobility/OOB;Supervision/Assistance - 24 hour     Equipment Recommendations  Other (comment) (defer to next venue)    Recommendations for Other Services       Precautions / Restrictions Precautions Precautions: Fall Precaution Comments: left inattention Restrictions Weight Bearing Restrictions: No    Mobility  Bed Mobility Overal bed mobility: Needs Assistance Bed Mobility: Rolling;Sidelying to Sit;Sit to Sidelying Rolling: Min assist   Supine to sit: Min assist   Sit to sidelying: Min assist General bed mobility comments: max directional verbal and tactile cues to stay on task, assit for trunk elevation  Transfers Overall transfer level: Needs assistance Equipment used: 1 person hand held assist Transfers: Sit to/from [facility] Sit to Stand: Mod assist         General transfer comment: modA to power up due to lack of attention/focus, pt unsteady and doesn't use L UE or LE functionally  to assist  Ambulation/Gait Ambulation/Gait assistance: Mod assist;+2 safety/equipment Gait Distance (Feet): 60 Feet Assistive device: 1 person hand held assist (on R) Gait Pattern/deviations: Step-through pattern;Decreased stride length;Narrow base of support     General Gait Details: pt initially steady with R HHA however then requiring modA and max directional verbal cues with onset of fatigue, attempted 2 person HHA however pt doesn't use her L UE to assist, pt with several stops with starring episodes as well   Stairs             Wheelchair Mobility    Modified Rankin (Stroke Patients Only) Modified Rankin (Stroke Patients Only) Pre-Morbid Rankin Score: Moderate disability Modified Rankin: Moderately severe disability     Balance Overall balance assessment: History of Falls;Needs assistance Sitting-balance support: Feet supported;Single extremity supported Sitting balance-Leahy Scale: Fair Sitting balance - Comments: Varying assist needed, Min guard-Mod A due to poor balance reactions and LOB in all directions.   Standing balance support: During functional activity Standing balance-Leahy Scale: Poor Standing balance comment: Requires external support for balance.                            Cognition Arousal/Alertness: Awake/alert Behavior During Therapy: WFL for tasks assessed/performed Overall Cognitive Status: History of cognitive impairments - at baseline Area of Impairment: Attention;Awareness;Problem solving                 Orientation Level: Disoriented to;Time;Situation Current Attention Level: Selective Memory: Decreased short-term memory Following Commands: Follows multi-step commands inconsistently Safety/Judgement: Decreased awareness of deficits Awareness: Emergent Problem Solving: Slow processing General Comments: pt with several episodes of gazing off/staring,  RN present and aware. Pt can become irritated then transitions to  laughing      Exercises      General Comments General comments (skin integrity, edema, etc.): son present      Pertinent Vitals/Pain Pain Assessment: No/denies pain    Home Living                      Prior Function            PT Goals (current goals can now be found in the care plan section) Progress towards PT goals: Progressing toward goals    Frequency    Min 4X/week      PT Plan Current plan remains appropriate    Co-evaluation              AM-PAC PT "6 Clicks" Mobility   Outcome Measure  Help needed turning from your back to your side while in a flat bed without using bedrails?: A Little Help needed moving from lying on your back to sitting on the side of a flat bed without using bedrails?: A Lot Help needed moving to and from a bed to a chair (including a wheelchair)?: A Lot Help needed standing up from a chair using your arms (e.g., wheelchair or bedside chair)?: A Lot Help needed to walk in hospital room?: A Lot Help needed climbing 3-5 steps with a railing? : Total 6 Click Score: 12    End of Session Equipment Utilized During Treatment: Gait belt Activity Tolerance: Patient tolerated treatment well Patient left: with call bell/phone within reach;with family/visitor present;Other (comment);in bed;with bed alarm set (pt going for test) Nurse Communication: Mobility status PT Visit Diagnosis: Hemiplegia and hemiparesis Hemiplegia - Right/Left: Left Hemiplegia - dominant/non-dominant: Non-dominant Hemiplegia - caused by: Cerebral infarction     Time: 6811-5726 PT Time Calculation (min) (ACUTE ONLY): 24 min  Charges:  $Gait Training: 8-22 mins $Therapeutic Activity: 8-22 mins                     Kittie Plater, PT, DPT Acute Rehabilitation Services Pager #: [phone] Office #: [phone]    Berline Lopes 08/24/2019, 1:36 PM

## 2019-08-24 NOTE — Progress Notes (Signed)
Inpatient Rehabilitation Admissions Coordinator  I await definitve surgical plans before proceeding with rehab venue options.  Danne Baxter, RN, MSN Rehab Admissions Coordinator [phone] 08/24/2019 3:28 PM

## 2019-08-24 NOTE — Progress Notes (Signed)
[place] for Eliquis to Heparin Indication: chest pain/ACS and atrial fibrillation  Allergies  Allergen Reactions  . Naproxen Sodium Other (See Comments)    Fever/aches and pains  . Statins Other (See Comments)    myalgias  . Sulfonamide Derivatives Hives and Itching  . Latex Itching and Rash  . Shellfish Allergy Itching, Swelling and Rash    Seafood, shrimp    Patient Measurements: Height: 5\' 2"  (157.5 cm) Weight: 66 kg (145 lb 8.1 oz) IBW/kg (Calculated) : 50.1  Vital Signs: Temp: 98.6 F (37 C) (07/07 0400) Temp Source: Oral (07/07 0400) BP: 135/57 (07/07 0400) Pulse Rate: 77 (07/07 0400)  Labs: Recent Labs    08/21/19 1939 08/21/19 1939 08/22/19 1137 08/22/19 1137 08/23/19 0425 08/24/19 0741  HGB 11.4*   < > 12.2   < > 12.0 11.6*  HCT 34.3*   < > 36.0  --  36.2 34.9*  PLT 197   < > 217  --  204 206  APTT  --   --   --   --   --  75*  HEPARINUNFRC  --   --   --   --   --  1.60*  CREATININE 0.75  --  0.65  --  0.61  --    < > = values in this interval not displayed.    Estimated Creatinine Clearance: 48.4 mL/min (by C-G formula based on SCr of 0.61 mg/dL).  Assessment: 84 year old female to begin heparin while Eliquis is on hold. With carotid stenosis with plans for surgery on Friday? Last dose of Eliquis 7/6 AM  PTT therapeutic this AM, CBC stable  Goal of Therapy:  aPTT 66-102 seconds seconds Monitor platelets by anticoagulation protocol: Yes   Plan:  Continue heparin at 900 units / hr Daily heparin level, CBC, PTT  Thank you Anette Guarneri, PharmD 08/24/2019,8:42 AM

## 2019-08-24 NOTE — Progress Notes (Signed)
Progress Note  Patient Name: Jaclyn [name] Date of Encounter: 08/24/2019  [facility] Cardiologist: Jaclyn Rouge, MD (Dr. Meda [name] saw here in consult)  Subjective   A little confused this morning.  Fairly tired.  No chest pain no shortness of breath.  Son sleeping beside her on couch.  Inpatient Medications    Scheduled Meds: . aspirin EC  81 mg Oral Daily  . atorvastatin  40 mg Oral Daily  . insulin aspart  0-15 Units Subcutaneous TID WC  . insulin aspart  0-5 Units Subcutaneous QHS  . insulin aspart  4 Units Subcutaneous TID WC  . insulin glargine  65 Units Subcutaneous Daily  . levothyroxine  112 mcg Oral Daily  . linagliptin  5 mg Oral Daily  . metoprolol tartrate  50 mg Oral BID  . trimethoprim  100 mg Oral Daily  . vitamin B-12  1,000 mcg Oral Daily   Continuous Infusions: . sodium chloride 100 mL/hr at 08/24/19 0854  . heparin 900 Units/hr (08/23/19 2230)   PRN Meds: acetaminophen **OR** acetaminophen (TYLENOL) oral liquid 160 mg/5 mL **OR** acetaminophen, senna-docusate   Vital Signs    Vitals:   08/23/19 2000 08/24/19 0000 08/24/19 0400 08/24/19 0800  BP: 120/60 (!) 119/51 (!) 135/57 129/72  Pulse: 75 75 77 75  Resp:    17  Temp: 98.5 F (36.9 C) 98.5 F (36.9 C) 98.6 F (37 C) 98.4 F (36.9 C)  TempSrc: Oral Oral Oral Oral  SpO2: 95% 95% 96% 95%  Weight:      Height:        Intake/Output Summary (Last 24 hours) at 08/24/2019 0907 Last data filed at 08/24/2019 2979 Gross per 24 hour  Intake 0 ml  Output 225 ml  Net -225 ml   Last 3 Weights 08/21/2019 02/10/2019 01/24/2019  Weight (lbs) 145 lb 8.1 oz 146 lb 4 oz 149 lb 14.6 oz  Weight (kg) 66 kg 66.339 kg 68 kg      Telemetry    Sinus rhythm, no atrial fibrillation- Personally Reviewed  ECG    Sinus rhythm- Personally Reviewed  Physical Exam   GEN: No acute distress.  Sleepy Neck: No JVD Cardiac: RRR, no murmurs, rubs, or gallops.  Respiratory: Clear to auscultation bilaterally. GI:  Soft, nontender, non-distended  MS: No edema; No deformity. Neuro:   Unable to fully assess Psych: Tired  Labs    High Sensitivity Troponin:  No results for input(s): TROPONINIHS in the last 720 hours.    Chemistry Recent Labs  Lab 08/22/19 1137 08/23/19 0425 08/24/19 0741  NA 139 142 140  K 3.6 3.0* 4.2  CL 107 108 109  CO2 21* 23 21*  GLUCOSE 127* 165* 318*  BUN 6* 5* 10  CREATININE 0.65 0.61 0.85  CALCIUM 8.7* 8.8* 8.8*  PROT 6.4* 6.1* 6.0*  ALBUMIN 3.4* 3.1* 3.0*  AST 19 20 21   ALT 16 16 17   ALKPHOS 60 57 63  BILITOT 0.6 1.0 0.6  GFRNONAA >60 >60 >60  GFRAA >60 >60 >60  ANIONGAP 11 11 10      Hematology Recent Labs  Lab 08/22/19 1137 08/23/19 0425 08/24/19 0741  WBC 7.2 5.5 5.1  RBC 4.05 4.11 3.89  HGB 12.2 12.0 11.6*  HCT 36.0 36.2 34.9*  MCV 88.9 88.1 89.7  MCH 30.1 29.2 29.8  MCHC 33.9 33.1 33.2  RDW 14.5 14.6 14.6  PLT 217 204 206    BNPNo results for input(s): BNP, PROBNP in the last 168 hours.  DDimer No results for input(s): DDIMER in the last 168 hours.   Radiology    EEG adult  Result Date: 08/22/2019 Jaclyn Goodell, MD     08/22/2019  3:21 PM ELECTROENCEPHALOGRAM REPORT Patient: Jaclyn [name]       Room #: 4T65Y EEG No. ID: [id number] Age: 84 y.o.        Sex: female Requesting Physician: Jaclyn [name] Report Date:  08/22/2019       Interpreting Physician: Jaclyn [name] History: Jaclyn [name] is an 84 y.o. female with altered mental status Medications: Eliquis, Lipitor, Insulin, Synthroid, Tradjenta Conditions of Recording:  This is a 21 channel routine scalp EEG performed with bipolar and monopolar montages arranged in accordance to the international 10/20 system of electrode placement. One channel was dedicated to EKG recording. The patient is in the awake and drowsy states. Description:  The patient is uncooperative and muscle and movement artifact are prominent during the recording.  When a waking background activity can be evaluated it consists  of a low voltage, symmetrical, fairly well organized, 8 Hz alpha activity, seen from the parieto-occipital and posterior temporal regions.  Low voltage fast activity, poorly organized, is seen anteriorly and is at times superimposed on more posterior regions.  A mixture of theta and alpha rhythms are seen from the central and temporal regions. The patient drowses with slowing to irregular, low voltage theta and beta activity.  Stage II sleep is not obtained. No epileptiform activity is noted.  Hyperventilation and intermittent photic stimulation were not performed. IMPRESSION: Normal electroencephalogram, awake and drowsy. There are no focal lateralizing or epileptiform features. Jaclyn Goodell, MD Neurology [phone] 08/22/2019, 3:17 PM   VAS US CAROTID  Result Date: 08/23/2019 Carotid Arterial Duplex Study Indications:       CVA. Risk Factors:      Hypertension, hyperlipidemia, Diabetes. Comparison Study:  06-20-2017 Carotid duplex RT ICA 60-79%, LT ICA 1-39% Performing Technologist: Jaclyn [name]  Examination Guidelines: A complete evaluation includes B-mode imaging, spectral Doppler, color Doppler, and power Doppler as needed of all accessible portions of each vessel. Bilateral testing is considered an integral part of a complete examination. Limited examinations for reoccurring indications may be performed as noted.  Right Carotid Findings: +----------+--------+--------+--------+------------------+--------+           PSV cm/sEDV cm/sStenosisPlaque DescriptionComments +----------+--------+--------+--------+------------------+--------+ CCA Prox  97      13              heterogenous               +----------+--------+--------+--------+------------------+--------+ CCA Distal154     19              heterogenous               +----------+--------+--------+--------+------------------+--------+ ICA Prox  375     121     80-99%  calcific                    +----------+--------+--------+--------+------------------+--------+ ICA Mid   154     36                                         +----------+--------+--------+--------+------------------+--------+ ICA Distal64      18                                         +----------+--------+--------+--------+------------------+--------+  ECA       510             >50%                               +----------+--------+--------+--------+------------------+--------+ +----------+--------+-------+----------------+-------------------+           PSV cm/sEDV cmsDescribe        Arm Pressure (mmHG) +----------+--------+-------+----------------+-------------------+ Subclavian129            Multiphasic, WNL                    +----------+--------+-------+----------------+-------------------+ +---------+--------+--+--------+-+---------+ VertebralPSV cm/s34EDV cm/s9Antegrade +---------+--------+--+--------+-+---------+  Left Carotid Findings: +----------+--------+--------+--------+------------------+--------+           PSV cm/sEDV cm/sStenosisPlaque DescriptionComments +----------+--------+--------+--------+------------------+--------+ CCA Prox  121     14              heterogenous               +----------+--------+--------+--------+------------------+--------+ CCA Distal143     20              heterogenous               +----------+--------+--------+--------+------------------+--------+ ICA Prox  133     48      40-59%  calcific                   +----------+--------+--------+--------+------------------+--------+ ICA Mid   119     28                                         +----------+--------+--------+--------+------------------+--------+ ICA Distal74      27                                         +----------+--------+--------+--------+------------------+--------+ ECA       188                                                 +----------+--------+--------+--------+------------------+--------+ +----------+--------+--------+----------------+-------------------+           PSV cm/sEDV cm/sDescribe        Arm Pressure (mmHG) +----------+--------+--------+----------------+-------------------+ [id number]              Multiphasic, WNL                    +----------+--------+--------+----------------+-------------------+ +---------+--------+--+--------+-+---------+ VertebralPSV cm/s47EDV cm/s6Antegrade +---------+--------+--+--------+-+---------+   Summary: Right Carotid: Velocities in the right ICA are consistent with a 80-99%                stenosis. Non-hemodynamically significant plaque <50% noted in                the CCA. The ECA appears >50% stenosed. Left Carotid: Velocities in the left ICA are consistent with a 40-59% stenosis.               Non-hemodynamically significant plaque <50% noted in the CCA. Vertebrals:  Bilateral vertebral arteries demonstrate antegrade flow. Subclavians: Normal flow hemodynamics were seen in bilateral subclavian              arteries. *See table(s) above for measurements and  observations.     Preliminary    CT ANGIO HEAD CODE STROKE  Result Date: 08/22/2019 CLINICAL DATA:  84 year old female with a small acute right superior frontal white matter lacunar infarct after presenting with arm weakness and confusion. EXAM: CT ANGIOGRAPHY HEAD AND NECK TECHNIQUE: Multidetector CT imaging of the head and neck was performed using the standard protocol during bolus administration of intravenous contrast. Multiplanar CT image reconstructions and MIPs were obtained to evaluate the vascular anatomy. Carotid stenosis measurements (when applicable) are obtained utilizing NASCET criteria, using the distal internal carotid diameter as the denominator. CONTRAST:  61mL OMNIPAQUE IOHEXOL 350 MG/ML SOLN COMPARISON:  Brain MRI and plain head CT 08/21/2019. CTA head and neck and CTP 06/20/2017. FINDINGS: CTA  NECK Skeleton: Carious dentition. Chronic cervical spine degeneration. No acute osseous abnormality identified. Upper chest: Chronic apical lung scarring is stable. No superior mediastinal lymphadenopathy. Unchanged borderline to mild circumferential wall thickening of the upper thoracic esophagus. Other neck: Negative. Aortic arch: 3 vessel arch configuration. Stable mild distal arch calcified plaque. Right carotid system: Negative brachiocephalic artery and proximal right CCA. Mild plaque proximal to the bifurcation without stenosis. Bulky chronic mostly calcified plaque at the right ICA origin and bulb resulting in radiographic string sign stenosis (series 5, image 94) over the initial 10 mm segment of the vessel. This appears mildly progressed. The right ICA remains patent and is otherwise negative to the skull base. Left carotid system: Minimal plaque at the left CCA origin without stenosis. Mild plaque proximal to the bifurcation without stenosis. Chronic bulky calcified plaque at the left ICA origin resulting in stenosis numerically estimated at 70% with respect to the distal vessel (series 9, image 126). This is stable. Left ICA is tortuous and remains patent distal to the stenosis. Vertebral arteries: Calcified proximal right subclavian artery plaque without stenosis. Dominant right vertebral artery is mildly ectatic without plaque or stenosis to the skull base. Moderate calcified plaque in the proximal left subclavian artery with up to 50% stenosis appears stable. Mild calcified plaque at the non dominant left vertebral artery origin not resulting in stenosis. The left vertebral remains non dominant to the skull base without stenosis. CTA HEAD Posterior circulation: Dominant right vertebral artery primarily supplies the basilar with minimal calcified plaque and no stenosis. Normal right PICA origin. Left V4 segment is non dominant with new severe stenosis or short segment occlusion just proximal to the  vertebrobasilar junction on series 11, image 20. This is about 3 mm in length. The basilar artery is patent but newly diminutive since 2019 with multifocal irregularity. Only mild focal basilar artery stenosis results. The SCA and PCA origins remain patent. Chronic bilateral PCA atherosclerosis has progressed now with severe bilateral P1 and P2 segment stenoses (series 11, images 17 and 18). Despite this there is distal bilateral PCA enhancement. Anterior circulation: Both ICA siphons are patent but heavily calcified. On the left there is severe supraclinoid stenosis on series 8, image 63, mildly progressed since 2019. On the right there is moderate to severe cavernous and proximal supraclinoid stenosis due to bulky calcified plaque which appears stable. Carotid termini remain patent. Mild-to-moderate bilateral chronic proximal MCA stenosis has not significantly changed. A1 segments remain normal. Mild bilateral ACA branch irregularity is stable. Distal right M1 stenosis is moderate and increased (series 10, image 16). Left MCA bifurcation remains patent, with stable left MCA branches demonstrating multifocal mild irregularity. Right MCA bifurcation remains patent with stable right MCA branches demonstrating multifocal mild irregularity. Venous sinuses:  Patent. Anatomic variants: Dominant right vertebral artery. Review of the MIP images confirms the above findings IMPRESSION: 1. Negative for large vessel occlusion. 2. But positive for chronic severe atherosclerosis which has progressed since a 2019 CTA as follows: - New Severe stenosis or Short Segment Occlusion of the distal Left Vertebral Artery V4 segment. - New diminutive caliber of the Basilar Artery which remains patent. - RADIOGRAPHIC STRING SIGN stenosis of the proximal Right ICA has progressed. Chronic moderate to severe Left ICA siphon stenosis is stable. - progressed Left ICA supraclinoid stenosis which is now moderate to severe. Stable proximal left Left  70% stenosis. - progressed and Severe bilateral PCA P1/P2 stenoses. - progressed and moderate Right MCA distal M1 stenosis. 3. Stable moderate bilateral proximal MCA stenoses. Stable proximal left subclavian artery plaque with 50% stenosis. Electronically Signed   By: Genevie Ann M.D.   On: 08/22/2019 11:59   CT ANGIO NECK CODE STROKE  Result Date: 08/22/2019 CLINICAL DATA:  84 year old female with a small acute right superior frontal white matter lacunar infarct after presenting with arm weakness and confusion. EXAM: CT ANGIOGRAPHY HEAD AND NECK TECHNIQUE: Multidetector CT imaging of the head and neck was performed using the standard protocol during bolus administration of intravenous contrast. Multiplanar CT image reconstructions and MIPs were obtained to evaluate the vascular anatomy. Carotid stenosis measurements (when applicable) are obtained utilizing NASCET criteria, using the distal internal carotid diameter as the denominator. CONTRAST:  66mL OMNIPAQUE IOHEXOL 350 MG/ML SOLN COMPARISON:  Brain MRI and plain head CT 08/21/2019. CTA head and neck and CTP 06/20/2017. FINDINGS: CTA NECK Skeleton: Carious dentition. Chronic cervical spine degeneration. No acute osseous abnormality identified. Upper chest: Chronic apical lung scarring is stable. No superior mediastinal lymphadenopathy. Unchanged borderline to mild circumferential wall thickening of the upper thoracic esophagus. Other neck: Negative. Aortic arch: 3 vessel arch configuration. Stable mild distal arch calcified plaque. Right carotid system: Negative brachiocephalic artery and proximal right CCA. Mild plaque proximal to the bifurcation without stenosis. Bulky chronic mostly calcified plaque at the right ICA origin and bulb resulting in radiographic string sign stenosis (series 5, image 94) over the initial 10 mm segment of the vessel. This appears mildly progressed. The right ICA remains patent and is otherwise negative to the skull base. Left carotid  system: Minimal plaque at the left CCA origin without stenosis. Mild plaque proximal to the bifurcation without stenosis. Chronic bulky calcified plaque at the left ICA origin resulting in stenosis numerically estimated at 70% with respect to the distal vessel (series 9, image 126). This is stable. Left ICA is tortuous and remains patent distal to the stenosis. Vertebral arteries: Calcified proximal right subclavian artery plaque without stenosis. Dominant right vertebral artery is mildly ectatic without plaque or stenosis to the skull base. Moderate calcified plaque in the proximal left subclavian artery with up to 50% stenosis appears stable. Mild calcified plaque at the non dominant left vertebral artery origin not resulting in stenosis. The left vertebral remains non dominant to the skull base without stenosis. CTA HEAD Posterior circulation: Dominant right vertebral artery primarily supplies the basilar with minimal calcified plaque and no stenosis. Normal right PICA origin. Left V4 segment is non dominant with new severe stenosis or short segment occlusion just proximal to the vertebrobasilar junction on series 11, image 20. This is about 3 mm in length. The basilar artery is patent but newly diminutive since 2019 with multifocal irregularity. Only mild focal basilar artery stenosis results. The SCA and PCA origins  remain patent. Chronic bilateral PCA atherosclerosis has progressed now with severe bilateral P1 and P2 segment stenoses (series 11, images 17 and 18). Despite this there is distal bilateral PCA enhancement. Anterior circulation: Both ICA siphons are patent but heavily calcified. On the left there is severe supraclinoid stenosis on series 8, image 63, mildly progressed since 2019. On the right there is moderate to severe cavernous and proximal supraclinoid stenosis due to bulky calcified plaque which appears stable. Carotid termini remain patent. Mild-to-moderate bilateral chronic proximal MCA  stenosis has not significantly changed. A1 segments remain normal. Mild bilateral ACA branch irregularity is stable. Distal right M1 stenosis is moderate and increased (series 10, image 16). Left MCA bifurcation remains patent, with stable left MCA branches demonstrating multifocal mild irregularity. Right MCA bifurcation remains patent with stable right MCA branches demonstrating multifocal mild irregularity. Venous sinuses: Patent. Anatomic variants: Dominant right vertebral artery. Review of the MIP images confirms the above findings IMPRESSION: 1. Negative for large vessel occlusion. 2. But positive for chronic severe atherosclerosis which has progressed since a 2019 CTA as follows: - New Severe stenosis or Short Segment Occlusion of the distal Left Vertebral Artery V4 segment. - New diminutive caliber of the Basilar Artery which remains patent. - RADIOGRAPHIC STRING SIGN stenosis of the proximal Right ICA has progressed. Chronic moderate to severe Left ICA siphon stenosis is stable. - progressed Left ICA supraclinoid stenosis which is now moderate to severe. Stable proximal left Left 70% stenosis. - progressed and Severe bilateral PCA P1/P2 stenoses. - progressed and moderate Right MCA distal M1 stenosis. 3. Stable moderate bilateral proximal MCA stenoses. Stable proximal left subclavian artery plaque with 50% stenosis. Electronically Signed   By: Genevie Ann M.D.   On: 08/22/2019 11:59    Cardiac Studies   Cardiac CT pending  Patient Profile     84 y.o. female with symptomatic right carotid stenosis, on chronic anticoagulation for atrial fibrillation  Assessment & Plan    Preoperative cardiac risk evaluation -Awaiting coronary CT scan.  Dr. Francesca Oman note reviewed. -Obtaining metoprolol prior to CT.  Chronic anticoagulation/paroxysmal atrial fibrillation -On heparin IV currently, Eliquis on hold. -Currently in sinus rhythm  Symptomatic right carotid stenosis -Timing of surgery per Dr. Scot Dock  may be delayed from a neurologic standpoint given some confusion.  Continue with goal-directed therapy, statin, aspirin.     For questions or updates, please contact [place] Please consult [email or web address] for contact info under        Signed, Candee Furbish, MD  08/24/2019, 9:07 AM

## 2019-08-25 DIAGNOSIS — I6521 Occlusion and stenosis of right carotid artery: Secondary | ICD-10-CM

## 2019-08-25 LAB — COMPREHENSIVE METABOLIC PANEL
ALT: 18 U/L (ref 0–44)
AST: 20 U/L (ref 15–41)
Albumin: 3.2 g/dL — ABNORMAL LOW (ref 3.5–5.0)
Alkaline Phosphatase: 63 U/L (ref 38–126)
Anion gap: 8 (ref 5–15)
BUN: <5 mg/dL — ABNORMAL LOW (ref 8–23)
CO2: 24 mmol/L (ref 22–32)
Calcium: 8.9 mg/dL (ref 8.9–10.3)
Chloride: 108 mmol/L (ref 98–111)
Creatinine, Ser: 0.72 mg/dL (ref 0.44–1.00)
GFR calc Af Amer: >60 mL/min (ref >60)
GFR calc non Af Amer: >60 mL/min (ref >60)
Glucose, Bld: 259 mg/dL — ABNORMAL HIGH (ref 70–99)
Potassium: 3.7 mmol/L (ref 3.5–5.1)
Sodium: 140 mmol/L (ref 135–145)
Total Bilirubin: 0.6 mg/dL (ref 0.3–1.2)
Total Protein: 6.3 g/dL — ABNORMAL LOW (ref 6.5–8.1)

## 2019-08-25 LAB — CBC
HCT: 34.6 % — ABNORMAL LOW (ref 36.0–46.0)
Hemoglobin: 11.3 g/dL — ABNORMAL LOW (ref 12.0–15.0)
MCH: 29.2 pg (ref 26.0–34.0)
MCHC: 32.7 g/dL (ref 30.0–36.0)
MCV: 89.4 fL (ref 80.0–100.0)
Platelets: 205 10*3/uL (ref 150–400)
RBC: 3.87 MIL/uL (ref 3.87–5.11)
RDW: 14.8 % (ref 11.5–15.5)
WBC: 6.4 10*3/uL (ref 4.0–10.5)
nRBC: 0 % (ref 0.0–0.2)

## 2019-08-25 LAB — APTT: aPTT: 79 seconds — ABNORMAL HIGH (ref 24–36)

## 2019-08-25 LAB — HEPARIN LEVEL (UNFRACTIONATED): Heparin Unfractionated: 0.67 IU/mL (ref 0.30–0.70)

## 2019-08-25 LAB — GLUCOSE, CAPILLARY
Glucose-Capillary: 225 mg/dL — ABNORMAL HIGH (ref 70–99)
Glucose-Capillary: 230 mg/dL — ABNORMAL HIGH (ref 70–99)
Glucose-Capillary: 216 mg/dL — ABNORMAL HIGH (ref 70–99)
Glucose-Capillary: 184 mg/dL — ABNORMAL HIGH (ref 70–99)

## 2019-08-25 LAB — MAGNESIUM: Magnesium: 2 mg/dL (ref 1.7–2.4)

## 2019-08-25 MED ORDER — MUPIROCIN 2 % EX OINT
1.0000 "application " | TOPICAL_OINTMENT | Freq: Two times a day (BID) | CUTANEOUS | Status: AC
  Administered 2019-08-25 – 2019-08-30 (×9): 1 via NASAL
  Filled 2019-08-25 (×3): qty 22

## 2019-08-25 MED ORDER — CEFAZOLIN SODIUM-DEXTROSE 2-4 GM/100ML-% IV SOLN
2.0000 g | INTRAVENOUS | Status: AC
  Administered 2019-08-26: 2 g via INTRAVENOUS

## 2019-08-25 MED ORDER — HEPARIN (PORCINE) 25000 UT/250ML-% IV SOLN
900.0000 [IU]/h | INTRAVENOUS | Status: AC
  Administered 2019-08-25: 900 [IU]/h via INTRAVENOUS

## 2019-08-25 NOTE — Progress Notes (Signed)
Patient ID: Jaclyn [name], female   DOB: 1935-06-06, 84 y.o.   MRN: [id number] Cardiac CT results noted with clearance from cardiology.  I explained the plan for right carotid endarterectomy for reduction of stroke risk.  I did explain that this would not improve her stroke recovery but would hopefully prevent future events.  I described surgery in detail including the risk for perioperative stroke.  The patient and her son understand and agree with the plan for proceeding at 730 tomorrow morning

## 2019-08-25 NOTE — Progress Notes (Signed)
Physical Therapy Treatment Patient Details Name: Jaclyn [name] MRN: [id number] DOB: 1935/07/15 Today's Date: 08/25/2019    History of Present Illness Patient is a 84 y/o female who presents with AMS and LUE weakness s/p fall at home. Found to have urinary frequency and hyperglycemia. 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 PMH includes COVID, CVA, dementia, HTN, hypothyroidism, recurrent UTI.    PT Comments    Pt progressing towards physical therapy goals. Pt with increased functional use of LUE this session, and was able to grip the walker fairly well (better with the walker hand splint), however we ended up progressing without the RW. Pt and granddaughter both report that the pt will not use the walker at home, so we opted to proceed with HHA. Pt requiring up to mod assist for balance support and safety. Cues required for path finding activity in hall. CIR recommendation remains appropriate. Will continue to follow and progress as able per POC.     Follow Up Recommendations  CIR;Supervision for mobility/OOB;Supervision/Assistance - 24 hour     Equipment Recommendations  Other (comment) (defer to next venue)    Recommendations for Other Services       Precautions / Restrictions Precautions Precautions: Fall Precaution Comments: left inattention (improving) Restrictions Weight Bearing Restrictions: No    Mobility  Bed Mobility Overal bed mobility: Needs Assistance Bed Mobility: Rolling;Sidelying to Sit;Sit to Sidelying Rolling: Min assist   Supine to sit: Min assist Sit to supine: Min guard Sit to sidelying: Min assist General bed mobility comments: Pt was received sitting up in the chair  Transfers Overall transfer level: Needs assistance Equipment used: Rolling walker (2 wheeled);1 person hand held assist Transfers: Sit to/from Stand Sit to Stand: Min  assist Stand pivot transfers: Mod assist       General transfer comment: Min assist for balance support and safety. Initially with the RW and then with therapist support. Pt stood from recliner chair and then low commode.   Ambulation/Gait Ambulation/Gait assistance: Min assist;+2 safety/equipment;Mod assist Gait Distance (Feet): 225 Feet Assistive device: 1 person hand held assist (on R) Gait Pattern/deviations: Step-through pattern;Decreased stride length;Narrow base of support Gait velocity: Decreased Gait velocity interpretation: <1.8 ft/sec, indicate of risk for recurrent falls General Gait Details: Pt and granddaughter both report that the patient will not use the RW at home, so we opted to go without an AD to simulate home environment. Pt required HHA on the R with heavy min assist to light mod assist for balance support. Pt preferred 2 hand support and frequently grabbing for HHA on the L as well. With BUE support, pt continued to require the same amount of assist through RUE. +2 helpful for IV pole management.    Stairs             Wheelchair Mobility    Modified Rankin (Stroke Patients Only) Modified Rankin (Stroke Patients Only) Pre-Morbid Rankin Score: Moderate disability Modified Rankin: Moderately severe disability     Balance Overall balance assessment: History of Falls;Needs assistance Sitting-balance support: Feet supported;Single extremity supported Sitting balance-Leahy Scale: Fair Sitting balance - Comments: Varying assist needed, Min guard-Mod A due to poor balance reactions and LOB in all directions.   Standing balance support: During functional activity Standing balance-Leahy Scale: Poor Standing balance comment: Requires external support for balance.  Cognition Arousal/Alertness: Awake/alert Behavior During Therapy: WFL for tasks assessed/performed Overall Cognitive Status: History of cognitive impairments -  at baseline Area of Impairment: Attention;Awareness;Problem solving;Safety/judgement;Following commands                 Orientation Level: Disoriented to;Time;Situation Current Attention Level: Selective Memory: Decreased short-term memory Following Commands: Follows multi-step commands inconsistently Safety/Judgement: Decreased awareness of deficits Awareness: Emergent Problem Solving: Slow processing;Difficulty sequencing;Requires verbal cues;Requires tactile cues General Comments: pt with several episodes of gazing off/staring, RN present and aware. Pt can become irritated then transitions to laughing, does well with tactile cues when sequencing      Exercises      General Comments        Pertinent Vitals/Pain Pain Assessment: Faces Faces Pain Scale: No hurt    Home Living                      Prior Function            PT Goals (current goals can now be found in the care plan section) Acute Rehab PT Goals Patient Stated Goal: To get to rehab PT Goal Formulation: With patient Time For Goal Achievement: 09/05/19 Potential to Achieve Goals: Fair Progress towards PT goals: Progressing toward goals    Frequency    Min 4X/week      PT Plan Current plan remains appropriate    Co-evaluation              AM-PAC PT "6 Clicks" Mobility   Outcome Measure  Help needed turning from your back to your side while in a flat bed without using bedrails?: A Little Help needed moving from lying on your back to sitting on the side of a flat bed without using bedrails?: A Little Help needed moving to and from a bed to a chair (including a wheelchair)?: A Little Help needed standing up from a chair using your arms (e.g., wheelchair or bedside chair)?: A Little Help needed to walk in hospital room?: A Little Help needed climbing 3-5 steps with a railing? : A Lot 6 Click Score: 17    End of Session Equipment Utilized During Treatment: Gait belt Activity  Tolerance: Patient tolerated treatment well Patient left: with family/visitor present;in chair;with call bell/phone within reach Nurse Communication: Mobility status PT Visit Diagnosis: Hemiplegia and hemiparesis Hemiplegia - Right/Left: Left Hemiplegia - dominant/non-dominant: Non-dominant Hemiplegia - caused by: Cerebral infarction     Time: 7628-3151 PT Time Calculation (min) (ACUTE ONLY): 18 min  Charges:  $Gait Training: 8-22 mins                     Rolinda Roan, PT, DPT Acute Rehabilitation Services Pager: [phone] Office: [phone]    CALIANNA KIM 08/25/2019, 1:00 PM

## 2019-08-25 NOTE — Progress Notes (Signed)
PROGRESS NOTE    Jaclyn [name]  QAS:[id number] DOB: [birth year] DOA: 08/21/2019 PCP: Jinny Sanders, MD      Brief Narrative:  Jaclyn [name] is a 84 y.o. F with hx A. fib on Eliquis, carotid stenosis, fibromyalgia, TIA, diabetes, hypothyroidism, HTN who presented with 2 days left-sided weakness as well as confusion.  In the ER, MRI showed acute ischemic stroke.       Assessment & Plan:  Acute ischemic stroke MRI showed subcentimeter right corona radiata stroke. -Non-invasive angiography showed carotid disease -Vascular surgery have been consulted for CEA -Echocardiogram showed no cardiogenic source of embolism   -Lipids ordered: continue atorvastatin -Aspirin ordered at admission --> continue aspirin -Atrial fibrillation: Previously known, continue on full dose anticoagulant -tPA not given because outside the stroke window -Dysphagia screen ordered in ER -PT eval ordered: recommended CIR -Smoking cessation: Not pertinent    Delirium Patient with some MCI prior to admission, now with stroke.  Delirium not unexpected.  Complicated by hard of hearing Delirium precautions:   -Lights and TV off, minimize interruptions at night  -Blinds open and lights on during day  -Glasses/hearing aid with patient  -Frequent reorientation  -PT/OT when able  -Avoid sedation medications/Beers list medications    Permanent atrial fibrillation -Hold home Eliquis -Continue heparin gtt until surgery  Type 2 diabetes, insulin-dependent, no complications BG elevated -Continue Levemir, increase dose -Continue sliding scale corrections, increase dose -Continue linagliptin   Hypothyroidism -Continue levothyroxine  B12 deficiency -Continue B12   History of recurrent UTI -Continue trimethoprim  Fibromyalgia Not on treatment  Hypertension Chart history, not on meds at home, BP normal here  Hypomagnesemia Repleted          Disposition: Status is: Inpatient  Remains  inpatient appropriate because:Ongoing diagnostic testing needed not appropriate for outpatient work up   Dispo: The patient is from: Home              Anticipated d/c is to: SNF              Anticipated d/c date is: > 3 days              Patient currently is not medically stable to d/c.              MDM: The below labs and imaging reports reviewed and summarized above.  Medication management as above.       DVT prophylaxis: Not applicable, on heparin  Code Status: Full code Family Communication: Son at the bedside    Consultants:   Neurology  Vascular surgery  Procedures:     Antimicrobials:      Culture data:              Subjective: Her left-sided weakness is fairly good.  She had some sundowning overnight, but is mostly better today.  No fever, dysuria, chest pain, new focal weakness or numbness.  Objective: Vitals:   08/25/19 0758 08/25/19 1220 08/25/19 1705 08/25/19 2016  BP: (!) 126/50 (!) 114/50 (!) 111/53 129/66  Pulse: 74 85 78 77  Resp: 18 18 16 16   Temp: 98.2 F (36.8 C) 98.2 F (36.8 C) 98.9 F (37.2 C) 99.1 F (37.3 C)  TempSrc: Oral Oral Oral Oral  SpO2: 100% 98% 97% 98%  Weight:      Height:        Intake/Output Summary (Last 24 hours) at 08/25/2019 2046 Last data filed at 08/25/2019 1800 Gross per 24 hour  Intake 608.3 ml  Output  1350 ml  Net -741.7 ml   Filed Weights   08/21/19 1857  Weight: 66 kg    Examination: General appearance:  adult female, alert and in no acute distress.   HEENT: Anicteric, conjunctiva pink, lids and lashes normal. No nasal deformity, discharge, epistaxis.  Lips moist, partially edentulous, oropharynx moist, no oral lesions.   Skin: Warm and dry.  No jaundice.  No suspicious rashes or lesions. Cardiac: RRR, nl S1-S2, no murmurs appreciated.  Capillary refill is brisk.  JVP normal.  No LE edema.  Radial pulses 2+ and symmetric. Respiratory: Normal respiratory rate and rhythm.  CTAB  without rales or wheezes. Abdomen: Abdomen soft.  No TTP or guarding. No ascites, distension, hepatosplenomegaly.   MSK: No deformities or effusions. Neuro: Awake and alert.  EOMI, some moderate left-sided weakness noted.  Speech fluent.    Psych: Sensorium intact and responding to questions, attention normal. Affect normal.  Judgment and insight appear impaired    Data Reviewed: I have personally reviewed following labs and imaging studies:  CBC: Recent Labs  Lab 08/21/19 1939 08/22/19 1137 08/23/19 0425 08/24/19 0741 08/25/19 0424  WBC 6.7 7.2 5.5 5.1 6.4  NEUTROABS 4.3 4.6 3.5  --   --   HGB 11.4* 12.2 12.0 11.6* 11.3*  HCT 34.3* 36.0 36.2 34.9* 34.6*  MCV 90.0 88.9 88.1 89.7 89.4  PLT 197 217 204 206 448   Basic Metabolic Panel: Recent Labs  Lab 08/21/19 1939 08/22/19 1137 08/23/19 0425 08/24/19 0741 08/25/19 0424  NA 132* 139 142 140 140  K 3.9 3.6 3.0* 4.2 3.7  CL 101 107 108 109 108  CO2 21* 21* 23 21* 24  GLUCOSE 360* 127* 165* 318* 259*  BUN 9 6* 5* 10 <5*  CREATININE 0.75 0.65 0.61 0.85 0.72  CALCIUM 8.4* 8.7* 8.8* 8.8* 8.9  MG  --   --  1.4* 1.6* 2.0  PHOS  --   --  3.8 3.2  --    GFR: Estimated Creatinine Clearance: 48.4 mL/min (by C-G formula based on SCr of 0.72 mg/dL). Liver Function Tests: Recent Labs  Lab 08/21/19 1939 08/22/19 1137 08/23/19 0425 08/24/19 0741 08/25/19 0424  AST 20 19 20 21 20   ALT 18 16 16 17 18   ALKPHOS 62 60 57 63 63  BILITOT 1.1 0.6 1.0 0.6 0.6  PROT 5.8* 6.4* 6.1* 6.0* 6.3*  ALBUMIN 3.2* 3.4* 3.1* 3.0* 3.2*   No results for input(s): LIPASE, AMYLASE in the last 168 hours. Recent Labs  Lab 08/22/19 1629  AMMONIA 26   Coagulation Profile: No results for input(s): INR, PROTIME in the last 168 hours. Cardiac Enzymes: No results for input(s): CKTOTAL, CKMB, CKMBINDEX, TROPONINI in the last 168 hours. BNP (last 3 results) No results for input(s): PROBNP in the last 8760 hours. HbA1C: No results for input(s):  HGBA1C in the last 72 hours. CBG: Recent Labs  Lab 08/24/19 1700 08/24/19 2127 08/25/19 0602 08/25/19 1112 08/25/19 1701  GLUCAP 89 190* 225* 230* 216*   Lipid Profile: No results for input(s): CHOL, HDL, LDLCALC, TRIG, CHOLHDL, LDLDIRECT in the last 72 hours. Thyroid Function Tests: No results for input(s): TSH, T4TOTAL, FREET4, T3FREE, THYROIDAB in the last 72 hours. Anemia Panel: No results for input(s): VITAMINB12, FOLATE, FERRITIN, TIBC, IRON, RETICCTPCT in the last 72 hours. Urine analysis:    Component Value Date/Time   COLORURINE YELLOW 08/21/2019 2040   APPEARANCEUR CLEAR 08/21/2019 2040   LABSPEC 1.027 08/21/2019 2040   PHURINE 5.0 08/21/2019 2040  GLUCOSEU >=500 (A) 08/21/2019 2040   GLUCOSEU >=1000 (A) 03/16/2019 0915   HGBUR NEGATIVE 08/21/2019 2040   BILIRUBINUR NEGATIVE 08/21/2019 2040   BILIRUBINUR negative 12/18/2017 1613   KETONESUR 20 (A) 08/21/2019 2040   PROTEINUR NEGATIVE 08/21/2019 2040   UROBILINOGEN 0.2 03/16/2019 0915   NITRITE NEGATIVE 08/21/2019 2040   LEUKOCYTESUR NEGATIVE 08/21/2019 2040   Sepsis Labs: @LABRCNTIP (procalcitonin:4,lacticacidven:4)  ) Recent Results (from the past 240 hour(s))  Urine culture     Status: Abnormal   Collection Time: 08/21/19  8:44 PM   Specimen: Urine, Clean Catch  Result Value Ref Range Status   Specimen Description URINE, CLEAN CATCH  Final   Special Requests   Final    NONE Performed at [facility], [place] [address] [place], [place] [address]    Culture (A)  Final    30,000 COLONIES/mL MULTIPLE SPECIES PRESENT, SUGGEST RECOLLECTION   Report Status 08/23/2019 FINAL  Final  SARS Coronavirus 2 by RT PCR (hospital order, performed in [facility] hospital lab) Nasopharyngeal Nasopharyngeal Swab     Status: None   Collection Time: 08/22/19  1:04 AM   Specimen: Nasopharyngeal Swab  Result Value Ref Range Status   SARS Coronavirus 2 NEGATIVE NEGATIVE Final    Comment: (NOTE) SARS-CoV-2 target  nucleic acids are NOT DETECTED.  The SARS-CoV-2 RNA is generally detectable in upper and lower respiratory specimens during the acute phase of infection. The lowest concentration of SARS-CoV-2 viral copies this assay can detect is 250 copies / mL. A negative result does not preclude SARS-CoV-2 infection and should not be used as the sole basis for treatment or other patient management decisions.  A negative result may occur with improper specimen collection / handling, submission of specimen other than nasopharyngeal swab, presence of viral mutation(s) within the areas targeted by this assay, and inadequate number of viral copies (<250 copies / mL). A negative result must be combined with clinical observations, patient history, and epidemiological information.  Fact Sheet for Patients:   StrictlyIdeas.no  Fact Sheet for Healthcare Providers: BankingDealers.co.za  This test is not yet approved or  cleared by the [place] FDA and has been authorized for detection and/or diagnosis of SARS-CoV-2 by FDA under an Emergency Use Authorization (EUA).  This EUA will remain in effect (meaning this test can be used) for the duration of the COVID-19 declaration under Section 564(b)(1) of the Act, 21 U.S.C. section 360bbb-3(b)(1), unless the authorization is terminated or revoked sooner.  Performed at [facility], [place] [address] [place], [place] [address]          Radiology Studies: CT CORONARY MORPH W/CTA COR W/SCORE W/CA W/CM &/OR WO/CM  Addendum Date: 08/24/2019   ADDENDUM REPORT: 08/24/2019 20:28 EXAM: CT FFR ANALYSIS CLINICAL DATA:  84 year old female with abnormal coronary CTA FINDINGS: FFRct analysis was performed on the original cardiac CT angiogram dataset. Diagrammatic representation of the FFRct analysis is provided in a separate PDF document in PACS. This dictation was created using the PDF document and an interactive 3D  model of the results. 3D model is not available in the EMR/PACS. Normal FFR range is >0.80. 1. Left Main: 0.99. 2. LAD: Proximal: 0.97, mid 0.82, distal: Small lumen < 2 mm. 3. D1: 0.77. 4. LCX: 0.94. 5. RCA: CT FFR not available secondary to motion. IMPRESSION: 1. CT FFR analysis didn't show any significant stenoses in left main or proximal/mid LAD. CT FFR is not available for RCA secondary to motion. Aggressive medical management  with high dose statin, aspirin and beta-blockers is recommended. Electronically Signed   By: Ena Dawley   On: 08/24/2019 20:28   Addendum Date: 08/24/2019   ADDENDUM REPORT: 08/24/2019 19:19 CLINICAL DATA:  84 year old female with h/o symptomatic carotid stenosis scheduled for an endarterectomy. EXAM: Cardiac/Coronary  CTA TECHNIQUE: The patient was scanned on a [employer]. FINDINGS: A 100 kV prospective scan was triggered in the descending thoracic aorta at 111 HU's. Axial non-contrast 3 mm slices were carried out through the heart. The data set was analyzed on a dedicated work station and scored using the [place]. Gantry rotation speed was 250 msecs and collimation was .6 mm. 50 mg of PO Metoprolol and 0.8 mg of sl NTG was given. The 3D data set was reconstructed in 5% intervals of the 67-82 % of the R-R cycle. Diastolic phases were analyzed on a dedicated work station using MPR, MIP and VRT modes. The patient received 80 cc of contrast. Aorta: Normal size. Moderate diffuse atherosclerotic disease and calcifications. No dissection. Aortic Valve: Trileaflet with minimal calcifications. Coronary Arteries:  Normal coronary origin.  Right dominance. RCA is a medium caliber dominant artery that gives rise to PDA and PLA. There is mild calcified plaque in the proximal and mid portion with stenosis 25-49%. Distal RCA, PDA and PLA have minimal plaque. Left main is a large artery that gives rise to LAD and LCX arteries. Left main has a moderate calcified plaque in the  distal portion with stenosis 50-69%. LAD is a large vessel that gives rise to one small diagonal artery. Proximal LAD has moderate mixed plaque with stenosis 50-69%. Mid and distal LAD are affected by motion. D1 is a small artery affected by motion. LCX is a small lumen non-dominant artery that gives rise to one small OM1 branch. OM1 has moderate calcified plaque in the proximal portion with stenosis 50-69%. Other findings: Normal pulmonary vein drainage into the left atrium. Normal left atrial appendage without a thrombus. Normal size of the pulmonary artery. IMPRESSION: 1. Coronary calcium score of 663. This was 5 percentile for age and sex matched control. 2. Normal coronary origin with right dominance. 3. CAD-RADS 3. Moderate stenosis. Study interpretation is affected by motion and poor vasodilation. There is moderate plaque in the distal left main and proximal LAD. Aggressive medical management is recommended. Additional analysis with CT FFR will be submitted. Electronically Signed   By: Ena Dawley   On: 08/24/2019 19:19   Result Date: 08/24/2019 EXAM: OVER-READ INTERPRETATION  CT CHEST The following report is an over-read performed by radiologist Dr. Suzy Bouchard of [facility], [place] on 08/24/2019. This over-read does not include interpretation of cardiac or coronary anatomy or pathology. The coronary CTA interpretation by the cardiologist is attached. COMPARISON:  None. FINDINGS: Limited view of the lung parenchyma demonstrates no suspicious nodularity. Airways are normal. Limited view of the mediastinum demonstrates no adenopathy. Esophagus normal. Limited view of the upper abdomen unremarkable. Limited view of the skeleton and chest wall is unremarkable. IMPRESSION: No significant extracardiac findings. Electronically Signed: By: Suzy Bouchard M.D. On: 08/24/2019 14:24        Scheduled Meds: . aspirin EC  81 mg Oral Daily  . atorvastatin  40 mg Oral Daily  . insulin aspart  0-15  Units Subcutaneous TID WC  . insulin aspart  0-5 Units Subcutaneous QHS  . insulin aspart  4 Units Subcutaneous TID WC  . insulin detemir  35 Units Subcutaneous BID  . levothyroxine  112 mcg Oral Daily  . linagliptin  5 mg Oral Daily  . mupirocin ointment  1 application Nasal BID  . trimethoprim  100 mg Oral Daily  . vitamin B-12  1,000 mcg Oral Daily   Continuous Infusions: . [START ON 08/26/2019]  ceFAZolin (ANCEF) IV    . heparin 900 Units/hr (08/25/19 8828)     LOS: 3 days    Time spent: 25 minutes    Edwin Dada, MD [facility] 08/25/2019, 8:46 PM     Please page though Goodnight or Epic secure chat:  For [employer], Adult nurse

## 2019-08-25 NOTE — Plan of Care (Signed)
  Problem: Activity: Goal: Risk for activity intolerance will decrease Outcome: Progressing   Problem: Nutrition: Goal: Adequate nutrition will be maintained Outcome: Progressing   Problem: Coping: Goal: Level of anxiety will decrease Outcome: Progressing   

## 2019-08-25 NOTE — H&P (View-Only) (Signed)
Patient ID: Jaclyn [name], female   DOB: 1935-09-19, 84 y.o.   MRN: [id number] Cardiac CT results noted with clearance from cardiology.  I explained the plan for right carotid endarterectomy for reduction of stroke risk.  I did explain that this would not improve her stroke recovery but would hopefully prevent future events.  I described surgery in detail including the risk for perioperative stroke.  The patient and her son understand and agree with the plan for proceeding at 730 tomorrow morning

## 2019-08-25 NOTE — Progress Notes (Addendum)
Progress Note  Patient Name: Jaclyn [name] Date of Encounter: 08/25/2019  [facility] Cardiologist: Jenkins Rouge, MD   Subjective   No CP no SOB, going to bathroom, PT in room.   Inpatient Medications    Scheduled Meds: . aspirin EC  81 mg Oral Daily  . atorvastatin  40 mg Oral Daily  . insulin aspart  0-15 Units Subcutaneous TID WC  . insulin aspart  0-5 Units Subcutaneous QHS  . insulin aspart  4 Units Subcutaneous TID WC  . insulin detemir  35 Units Subcutaneous BID  . levothyroxine  112 mcg Oral Daily  . linagliptin  5 mg Oral Daily  . trimethoprim  100 mg Oral Daily  . vitamin B-12  1,000 mcg Oral Daily   Continuous Infusions: . [START ON 08/26/2019]  ceFAZolin (ANCEF) IV    . heparin 900 Units/hr (08/25/19 0918)   PRN Meds: acetaminophen **OR** acetaminophen (TYLENOL) oral liquid 160 mg/5 mL **OR** acetaminophen, ondansetron (ZOFRAN) IV, senna-docusate   Vital Signs    Vitals:   08/24/19 1945 08/24/19 2315 08/25/19 0324 08/25/19 0758  BP: (!) 134/55 (!) 120/55 (!) 139/57 (!) 126/50  Pulse: 66 72 67 74  Resp: 19 18 18 18   Temp: 98.7 F (37.1 C) 98.2 F (36.8 C) 98.4 F (36.9 C) 98.2 F (36.8 C)  TempSrc: Oral Oral Oral Oral  SpO2: 100% 100% 98% 100%  Weight:      Height:        Intake/Output Summary (Last 24 hours) at 08/25/2019 1037 Last data filed at 08/25/2019 0641 Gross per 24 hour  Intake 50 ml  Output 1350 ml  Net -1300 ml   Last 3 Weights 08/21/2019 02/10/2019 01/24/2019  Weight (lbs) 145 lb 8.1 oz 146 lb 4 oz 149 lb 14.6 oz  Weight (kg) 66 kg 66.339 kg 68 kg      Telemetry    NSR - Personally Reviewed  ECG    No ischemic changes. SR - Personally Reviewed  Physical Exam   GEN: No acute distress.   Sitting on toilet, trying to get up  Labs    High Sensitivity Troponin:  No results for input(s): TROPONINIHS in the last 720 hours.    Chemistry Recent Labs  Lab 08/23/19 0425 08/24/19 0741 08/25/19 0424  NA 142 140 140  K 3.0*  4.2 3.7  CL 108 109 108  CO2 23 21* 24  GLUCOSE 165* 318* 259*  BUN 5* 10 <5*  CREATININE 0.61 0.85 0.72  CALCIUM 8.8* 8.8* 8.9  PROT 6.1* 6.0* 6.3*  ALBUMIN 3.1* 3.0* 3.2*  AST 20 21 20   ALT 16 17 18   ALKPHOS 57 63 63  BILITOT 1.0 0.6 0.6  GFRNONAA >60 >60 >60  GFRAA >60 >60 >60  ANIONGAP 11 10 8      Hematology Recent Labs  Lab 08/23/19 0425 08/24/19 0741 08/25/19 0424  WBC 5.5 5.1 6.4  RBC 4.11 3.89 3.87  HGB 12.0 11.6* 11.3*  HCT 36.2 34.9* 34.6*  MCV 88.1 89.7 89.4  MCH 29.2 29.8 29.2  MCHC 33.1 33.2 32.7  RDW 14.6 14.6 14.8  PLT 204 206 205    BNPNo results for input(s): BNP, PROBNP in the last 168 hours.   DDimer No results for input(s): DDIMER in the last 168 hours.   Radiology    CT CORONARY MORPH W/CTA COR W/SCORE W/CA W/CM &/OR WO/CM  Addendum Date: 08/24/2019   ADDENDUM REPORT: 08/24/2019 20:28 EXAM: CT FFR ANALYSIS CLINICAL DATA:  84 year old  female with abnormal coronary CTA FINDINGS: FFRct analysis was performed on the original cardiac CT angiogram dataset. Diagrammatic representation of the FFRct analysis is provided in a separate PDF document in PACS. This dictation was created using the PDF document and an interactive 3D model of the results. 3D model is not available in the EMR/PACS. Normal FFR range is >0.80. 1. Left Main: 0.99. 2. LAD: Proximal: 0.97, mid 0.82, distal: Small lumen < 2 mm. 3. D1: 0.77. 4. LCX: 0.94. 5. RCA: CT FFR not available secondary to motion. IMPRESSION: 1. CT FFR analysis didn't show any significant stenoses in left main or proximal/mid LAD. CT FFR is not available for RCA secondary to motion. Aggressive medical management with high dose statin, aspirin and beta-blockers is recommended. Electronically Signed   By: Ena Dawley   On: 08/24/2019 20:28   Addendum Date: 08/24/2019   ADDENDUM REPORT: 08/24/2019 19:19 CLINICAL DATA:  84 year old female with h/o symptomatic carotid stenosis scheduled for an endarterectomy. EXAM:  Cardiac/Coronary  CTA TECHNIQUE: The patient was scanned on a [employer]. FINDINGS: A 100 kV prospective scan was triggered in the descending thoracic aorta at 111 HU's. Axial non-contrast 3 mm slices were carried out through the heart. The data set was analyzed on a dedicated work station and scored using the [place]. Gantry rotation speed was 250 msecs and collimation was .6 mm. 50 mg of PO Metoprolol and 0.8 mg of sl NTG was given. The 3D data set was reconstructed in 5% intervals of the 67-82 % of the R-R cycle. Diastolic phases were analyzed on a dedicated work station using MPR, MIP and VRT modes. The patient received 80 cc of contrast. Aorta: Normal size. Moderate diffuse atherosclerotic disease and calcifications. No dissection. Aortic Valve: Trileaflet with minimal calcifications. Coronary Arteries:  Normal coronary origin.  Right dominance. RCA is a medium caliber dominant artery that gives rise to PDA and PLA. There is mild calcified plaque in the proximal and mid portion with stenosis 25-49%. Distal RCA, PDA and PLA have minimal plaque. Left main is a large artery that gives rise to LAD and LCX arteries. Left main has a moderate calcified plaque in the distal portion with stenosis 50-69%. LAD is a large vessel that gives rise to one small diagonal artery. Proximal LAD has moderate mixed plaque with stenosis 50-69%. Mid and distal LAD are affected by motion. D1 is a small artery affected by motion. LCX is a small lumen non-dominant artery that gives rise to one small OM1 branch. OM1 has moderate calcified plaque in the proximal portion with stenosis 50-69%. Other findings: Normal pulmonary vein drainage into the left atrium. Normal left atrial appendage without a thrombus. Normal size of the pulmonary artery. IMPRESSION: 1. Coronary calcium score of 663. This was 23 percentile for age and sex matched control. 2. Normal coronary origin with right dominance. 3. CAD-RADS 3. Moderate  stenosis. Study interpretation is affected by motion and poor vasodilation. There is moderate plaque in the distal left main and proximal LAD. Aggressive medical management is recommended. Additional analysis with CT FFR will be submitted. Electronically Signed   By: Ena Dawley   On: 08/24/2019 19:19   Result Date: 08/24/2019 EXAM: OVER-READ INTERPRETATION  CT CHEST The following report is an over-read performed by radiologist Dr. Suzy Bouchard of [facility], [place] on 08/24/2019. This over-read does not include interpretation of cardiac or coronary anatomy or pathology. The coronary CTA interpretation by the cardiologist is attached. COMPARISON:  None. FINDINGS: Limited  view of the lung parenchyma demonstrates no suspicious nodularity. Airways are normal. Limited view of the mediastinum demonstrates no adenopathy. Esophagus normal. Limited view of the upper abdomen unremarkable. Limited view of the skeleton and chest wall is unremarkable. IMPRESSION: No significant extracardiac findings. Electronically Signed: By: Suzy Bouchard M.D. On: 08/24/2019 14:24    Cardiac Studies   Cardiac CT with no proximal flow limiting disease. Continue with medical mgt.   Patient Profile     84 y.o. female with CAD, stroke, carotid dz  Assessment & Plan    Preoperative cardiac risk evaluation -Coronary CT scan with no proximal flow limiting disease. Continue with medical mgt. On statin, ASA.  Dr. Francesca Oman note reviewed. -OK to proceed with moderate risk from cardiac perspective with endarterectomy  Chronic anticoagulation/paroxysmal atrial fibrillation -On heparin IV currently, Eliquis on hold. Resume post op per vascular -Currently in sinus rhythm  Symptomatic right carotid stenosis -Timing of surgery per Dr. Scot Dock. OK to proceed from cards perspective.   Continue with goal-directed therapy, statin, aspirin.  Will sign off. Please call if any ?    For questions or updates, please  contact [place] Please consult [email or web address] for contact info under        Signed, Candee Furbish, MD  08/25/2019, 10:37 AM

## 2019-08-25 NOTE — Progress Notes (Addendum)
   VASCULAR SURGERY ASSESSMENT & PLAN:   SYMPTOMATIC RIGHT CAROTID STENOSIS: The patient is more oriented this morning.  She has perhaps some slight improvement in her left-sided weakness.  I think it would be safe to proceed with right carotid endarterectomy tomorrow pending the results of her coronary CTA for preoperative cardiac clearance.  Her Eliquis is on hold and she is on intravenous heparin.  I have discussed with her the indications for surgery and potential complications including but not limited to a 1 to 2% risk of stroke.  All of her questions were answered and she is agreeable to proceed.  She understands that Dr. Donnetta Hutching will be doing the surgery.  I have written her preop orders.  I have written to discontinue her heparin at 6 AM.  She is on aspirin and is on a statin.   SUBJECTIVE:   No specific complaints this morning.  PHYSICAL EXAM:   Vitals:   08/24/19 1945 08/24/19 2315 08/25/19 0324 08/25/19 0758  BP: (!) 134/55 (!) 120/55 (!) 139/57 (!) 126/50  Pulse: 66 72 67 74  Resp: 19 18 18 18   Temp: 98.7 F (37.1 C) 98.2 F (36.8 C) 98.4 F (36.9 C) 98.2 F (36.8 C)  TempSrc: Oral Oral Oral Oral  SpO2: 100% 100% 98% 100%  Weight:      Height:       Slight improvement in left upper extremity weakness.  LABS:   Lab Results  Component Value Date   WBC 6.4 08/25/2019   HGB 11.3 (L) 08/25/2019   HCT 34.6 (L) 08/25/2019   MCV 89.4 08/25/2019   PLT 205 08/25/2019   Lab Results  Component Value Date   CREATININE 0.72 08/25/2019   Lab Results  Component Value Date   INR 1.4 (H) 01/24/2019   CBG (last 3)  Recent Labs    08/24/19 1700 08/24/19 2127 08/25/19 0602  GLUCAP 89 190* 225*    PROBLEM LIST:    Principal Problem:   Acute CVA (cerebrovascular accident) ([place]) Active Problems:   Hypothyroidism   Poorly controlled type 2 diabetes mellitus with complication ([place])   PAROXYSMAL ATRIAL FIBRILLATION   Stenosis of right carotid artery   Mild  dementia ([place])   CVA (cerebral vascular accident) ([place])   CURRENT MEDS:   . aspirin EC  81 mg Oral Daily  . atorvastatin  40 mg Oral Daily  . insulin aspart  0-15 Units Subcutaneous TID WC  . insulin aspart  0-5 Units Subcutaneous QHS  . insulin aspart  4 Units Subcutaneous TID WC  . insulin detemir  35 Units Subcutaneous BID  . levothyroxine  112 mcg Oral Daily  . linagliptin  5 mg Oral Daily  . trimethoprim  100 mg Oral Daily  . vitamin B-12  1,000 mcg Oral Daily    Deitra Mayo Office: [phone] 08/25/2019

## 2019-08-25 NOTE — Progress Notes (Signed)
Inpatient Rehabilitation Admissions Coordinator  I met at bedside with patient and her granddaughter, Nicki, I explained that I will await postop PT and OT assessments to determine if patient will need Cir admit prior to d/c home with family. Family can provide 24/7 assist at d/c and NIcki states they will not agree to SNF. I will follow up postoperatively if patient remains in house.Please call with any questions.   , RN, MSN Rehab Admissions Coordinator [phone] 08/25/2019 12:04 PM  

## 2019-08-25 NOTE — Progress Notes (Signed)
[place] for Eliquis to Heparin Indication: chest pain/ACS and atrial fibrillation  Allergies  Allergen Reactions  . Naproxen Sodium Other (See Comments)    Fever/aches and pains  . Statins Other (See Comments)    myalgias  . Sulfonamide Derivatives Hives and Itching  . Latex Itching and Rash  . Shellfish Allergy Itching, Swelling and Rash    Seafood, shrimp    Patient Measurements: Height: 5\' 2"  (157.5 cm) Weight: 66 kg (145 lb 8.1 oz) IBW/kg (Calculated) : 50.1  Vital Signs: Temp: 98.2 F (36.8 C) (07/08 0758) Temp Source: Oral (07/08 0758) BP: 126/50 (07/08 0758) Pulse Rate: 74 (07/08 0758)  Labs: Recent Labs    08/23/19 0425 08/23/19 0425 08/24/19 0741 08/25/19 0424  HGB 12.0   < > 11.6* 11.3*  HCT 36.2  --  34.9* 34.6*  PLT 204  --  206 205  APTT  --   --  75* 79*  HEPARINUNFRC  --   --  1.60* 0.67  CREATININE 0.61  --  0.85 0.72   < > = values in this interval not displayed.    Estimated Creatinine Clearance: 48.4 mL/min (by C-G formula based on SCr of 0.72 mg/dL).  Assessment: 84 year old female to begin heparin while Eliquis is on hold.  With carotid stenosis with plans for surgery on Friday, 7/9, heparin to stop at 6 am  PTT therapeutic this AM, CBC stable  Goal of Therapy:  aPTT 66-102 seconds seconds Monitor platelets by anticoagulation protocol: Yes   Plan:  Continue heparin at 900 units / hr Heparin to stop at 6 am for surgery Follow up post op  Thank you Anette Guarneri, PharmD 08/25/2019,9:14 AM

## 2019-08-25 NOTE — Progress Notes (Signed)
Occupational Therapy Treatment Patient Details Name: Jaclyn [name] MRN: [id number] DOB: 08/25/1935 Today's Date: 08/25/2019    History of present illness Patient is a 84 y/o female who presents with AMS and LUE weakness s/p fall at home. Found to have urinary frequency and hyperglycemia. 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 PMH includes COVID, CVA, dementia, HTN, hypothyroidism, recurrent UTI.   OT comments  Patient continues to make steady progress towards goals in skilled OT session. Patient's session encompassed ADLs at the sink and toileting in order to improve in overall ability with LUE, sequencing, following commands, and increasing functional mobility to complete household tasks. Pt requires frequent cues to maintain attention to task, as well as multimodal cues to sequence (especially with regard to toileting and sidestepping to center self in the mirror to complete grooming) however is able to utilize LUE with increased proficiency in session. Pt would continue to make an excellent candidate for CIR as pt has phenomenal family support, and can tolerate three hours of intensive therapy. Therapy will continue to follow acutely.   Follow Up Recommendations  CIR    Equipment Recommendations  3 in 1 bedside commode    Recommendations for Other Services      Precautions / Restrictions Precautions Precautions: Fall Precaution Comments: left inattention Restrictions Weight Bearing Restrictions: No       Mobility Bed Mobility Overal bed mobility: Needs Assistance Bed Mobility: Rolling;Sidelying to Sit;Sit to Sidelying Rolling: Min assist   Supine to sit: Min assist Sit to supine: Min guard Sit to sidelying: Min assist General bed mobility comments: max directional verbal and tactile cues to stay on task, assit for trunk elevation  Transfers Overall transfer level:  Needs assistance Equipment used: 1 person hand held assist Transfers: Sit to/from [facility] Sit to Stand: Mod assist Stand pivot transfers: Mod assist       General transfer comment: modA to power up due to lack of attention/focus, pt unsteady and doesn't use L UE or LE functionally to assist    Balance Overall balance assessment: History of Falls;Needs assistance Sitting-balance support: Feet supported;Single extremity supported Sitting balance-Leahy Scale: Fair Sitting balance - Comments: Varying assist needed, Min guard-Mod A due to poor balance reactions and LOB in all directions.   Standing balance support: During functional activity Standing balance-Leahy Scale: Poor Standing balance comment: Requires external support for balance.                           ADL either performed or assessed with clinical judgement   ADL Overall ADL's : Needs assistance/impaired     Grooming: Brushing hair;Wash/dry hands;Standing;Minimal assistance;Moderate assistance Grooming Details (indicate cue type and reason): Pt able to complete grooming in standing with min A for stability, emphasis on using LUE, dropped comb x1, then able to catch brush as it fell on second trial, occasional mod A for positioning and balance at sink             Lower Body Dressing: Minimal assistance Lower Body Dressing Details (indicate cue type and reason): max verbal cues to adjust socks, but able to complete when hand initially guided to socks when sitting in chair (feet were elevated in recliner at time) Toilet Transfer: Minimal assistance;Grab bars;Regular Toilet;Cueing for safety;Cueing for sequencing Toilet Transfer Details (indicate cue type and reason): heavy reliance on grab bar with R UE. pt needs (A) To power up  into standing, needed increased A sequencing movements to position over commode appropriately Toileting- Clothing Manipulation and Hygiene: Moderate assistance;Cueing  for compensatory techniques;Cueing for safety;Cueing for sequencing;Maximal assistance Toileting - Clothing Manipulation Details (indicate cue type and reason): pt able to void in session, however unable to complete clothing management and peri-care with mod-max A to sequence. once instructed to complete in standing, pt able to complete, however required mod A from therapist to ensure balance     Functional mobility during ADLs: Minimal assistance;Moderate assistance General ADL Comments: Pt continues to improve (especially with use of LUE), however requires significant prompting when sequencing multi-step tasks such as toileting     Vision       Perception     Praxis      Cognition Arousal/Alertness: Awake/alert Behavior During Therapy: WFL for tasks assessed/performed Overall Cognitive Status: History of cognitive impairments - at baseline Area of Impairment: Attention;Awareness;Problem solving                 Orientation Level: Disoriented to;Time;Situation Current Attention Level: Selective Memory: Decreased short-term memory Following Commands: Follows multi-step commands inconsistently Safety/Judgement: Decreased awareness of deficits Awareness: Emergent Problem Solving: Slow processing;Difficulty sequencing;Requires verbal cues;Requires tactile cues General Comments: pt with several episodes of gazing off/staring, RN present and aware. Pt can become irritated then transitions to laughing, does well with tactile cues when sequencing        Exercises     Shoulder Instructions       General Comments      Pertinent Vitals/ Pain       Pain Assessment: Faces Faces Pain Scale: No hurt  Home Living                                          Prior Functioning/Environment              Frequency  Min 3X/week        Progress Toward Goals  OT Goals(current goals can now be found in the care plan section)  Progress towards OT goals:  Progressing toward goals  Acute Rehab OT Goals Patient Stated Goal: To get to rehab OT Goal Formulation: With family Time For Goal Achievement: 09/06/19 Potential to Achieve Goals: Good  Plan Discharge plan remains appropriate    Co-evaluation                 AM-PAC OT "6 Clicks" Daily Activity     Outcome Measure   Help from another person eating meals?: A Little Help from another person taking care of personal grooming?: A Lot Help from another person toileting, which includes using toliet, bedpan, or urinal?: A Lot Help from another person bathing (including washing, rinsing, drying)?: A Lot Help from another person to put on and taking off regular upper body clothing?: A Lot Help from another person to put on and taking off regular lower body clothing?: A Lot 6 Click Score: 13    End of Session Equipment Utilized During Treatment: Gait belt;Other (comment) (Hand held assist)  OT Visit Diagnosis: Unsteadiness on feet (R26.81);Muscle weakness (generalized) (M62.81)   Activity Tolerance Patient tolerated treatment well   Patient Left in chair;with call bell/phone within reach;with family/visitor present   Nurse Communication Mobility status;Precautions        Time: 8466-5993 OT Time Calculation (min): 23 min  Charges: OT General Charges $OT Visit: 1 Visit OT Treatments $  Self Care/Home Management : 23-37 mins  [place]. Karsyn Rochin, COTA/L Acute Rehabilitation Services [phone] [place] 08/25/2019, 12:47 PM

## 2019-08-26 ENCOUNTER — Inpatient Hospital Stay (HOSPITAL_COMMUNITY): Payer: Medicare Other | Admitting: Anesthesiology

## 2019-08-26 ENCOUNTER — Encounter (HOSPITAL_COMMUNITY)
Admission: EM | Disposition: A | Discharge status: Rehabilitation Facility | Payer: Self-pay | Source: Home / Self Care | Attending: Family Medicine

## 2019-08-26 DIAGNOSIS — I6521 Occlusion and stenosis of right carotid artery: Secondary | ICD-10-CM

## 2019-08-26 HISTORY — PX: ENDARTERECTOMY: SHX5162

## 2019-08-26 HISTORY — PX: PATCH ANGIOPLASTY: SHX6230

## 2019-08-26 LAB — SURGICAL PCR SCREEN
MRSA, PCR: NEGATIVE
Staphylococcus aureus: NEGATIVE

## 2019-08-26 LAB — HEPARIN LEVEL (UNFRACTIONATED): Heparin Unfractionated: 0.55 IU/mL (ref 0.30–0.70)

## 2019-08-26 LAB — CBC
HCT: 36.6 % (ref 36.0–46.0)
Hemoglobin: 11.8 g/dL — ABNORMAL LOW (ref 12.0–15.0)
MCH: 29 pg (ref 26.0–34.0)
MCHC: 32.2 g/dL (ref 30.0–36.0)
MCV: 89.9 fL (ref 80.0–100.0)
Platelets: 221 10*3/uL (ref 150–400)
RBC: 4.07 MIL/uL (ref 3.87–5.11)
RDW: 14.8 % (ref 11.5–15.5)
WBC: 5.7 10*3/uL (ref 4.0–10.5)
nRBC: 0 % (ref 0.0–0.2)

## 2019-08-26 LAB — GLUCOSE, CAPILLARY
Glucose-Capillary: 172 mg/dL — ABNORMAL HIGH (ref 70–99)
Glucose-Capillary: 141 mg/dL — ABNORMAL HIGH (ref 70–99)
Glucose-Capillary: 147 mg/dL — ABNORMAL HIGH (ref 70–99)
Glucose-Capillary: 130 mg/dL — ABNORMAL HIGH (ref 70–99)

## 2019-08-26 LAB — APTT: aPTT: 86 seconds — ABNORMAL HIGH (ref 24–36)

## 2019-08-26 SURGERY — ENDARTERECTOMY, CAROTID
Anesthesia: General | Site: Neck | Laterality: Right

## 2019-08-26 MED ORDER — FENTANYL CITRATE (PF) 100 MCG/2ML IJ SOLN
INTRAMUSCULAR | Status: DC | PRN
  Administered 2019-08-26: 50 ug via INTRAVENOUS
  Administered 2019-08-26: 100 ug via INTRAVENOUS
  Administered 2019-08-26: 50 ug via INTRAVENOUS

## 2019-08-26 MED ORDER — HEPARIN SODIUM (PORCINE) 1000 UNIT/ML IJ SOLN
INTRAMUSCULAR | Status: DC | PRN
  Administered 2019-08-26: 7000 [IU] via INTRAVENOUS

## 2019-08-26 MED ORDER — LABETALOL HCL 5 MG/ML IV SOLN: 10.0000 mg | INTRAVENOUS | Status: DC | PRN

## 2019-08-26 MED ORDER — ROCURONIUM BROMIDE 10 MG/ML (PF) SYRINGE
PREFILLED_SYRINGE | INTRAVENOUS | Status: DC | PRN
  Administered 2019-08-26: 60 mg via INTRAVENOUS

## 2019-08-26 MED ORDER — ONDANSETRON HCL 4 MG/2ML IJ SOLN
INTRAMUSCULAR | Status: DC | PRN
  Administered 2019-08-26: 4 mg via INTRAVENOUS

## 2019-08-26 MED ORDER — SODIUM CHLORIDE 0.9 % IV SOLN
INTRAVENOUS | Status: AC
  Filled 2019-08-26: qty 1.2

## 2019-08-26 MED ORDER — FENTANYL CITRATE (PF) 100 MCG/2ML IJ SOLN: 25.0000 ug | INTRAMUSCULAR | Status: DC | PRN

## 2019-08-26 MED ORDER — ONDANSETRON HCL 4 MG/2ML IJ SOLN: 4.0000 mg | Freq: Four times a day (QID) | INTRAMUSCULAR | Status: DC | PRN

## 2019-08-26 MED ORDER — PHENYLEPHRINE 40 MCG/ML (10ML) SYRINGE FOR IV PUSH (FOR BLOOD PRESSURE SUPPORT)
PREFILLED_SYRINGE | INTRAVENOUS | Status: DC | PRN
  Administered 2019-08-26: 40 ug via INTRAVENOUS
  Administered 2019-08-26: 80 ug via INTRAVENOUS

## 2019-08-26 MED ORDER — OXYCODONE HCL 5 MG/5ML PO SOLN: 5.0000 mg | Freq: Once | ORAL | Status: DC | PRN

## 2019-08-26 MED ORDER — METOPROLOL TARTRATE 5 MG/5ML IV SOLN: 2.0000 mg | INTRAVENOUS | Status: DC | PRN

## 2019-08-26 MED ORDER — DEXAMETHASONE SODIUM PHOSPHATE 10 MG/ML IJ SOLN
INTRAMUSCULAR | Status: DC | PRN
Start: 2019-08-26 — End: 2019-08-26
  Administered 2019-08-26: 4 mg via INTRAVENOUS

## 2019-08-26 MED ORDER — HYDRALAZINE HCL 20 MG/ML IJ SOLN: 5.0000 mg | INTRAMUSCULAR | Status: DC | PRN

## 2019-08-26 MED ORDER — FENTANYL CITRATE (PF) 250 MCG/5ML IJ SOLN
INTRAMUSCULAR | Status: AC
  Filled 2019-08-26: qty 5

## 2019-08-26 MED ORDER — HEPARIN SODIUM (PORCINE) 1000 UNIT/ML IJ SOLN
INTRAMUSCULAR | Status: AC
  Filled 2019-08-26: qty 1

## 2019-08-26 MED ORDER — SODIUM CHLORIDE 0.9 % IV SOLN
INTRAVENOUS | Status: DC | PRN
  Administered 2019-08-26: 500 mL

## 2019-08-26 MED ORDER — DOCUSATE SODIUM 100 MG PO CAPS
100.0000 mg | ORAL_CAPSULE | Freq: Every day | ORAL | Status: DC
  Administered 2019-08-27 – 2019-08-31 (×5): 100 mg via ORAL
  Filled 2019-08-26 (×5): qty 1

## 2019-08-26 MED ORDER — PANTOPRAZOLE SODIUM 40 MG PO TBEC
40.0000 mg | DELAYED_RELEASE_TABLET | Freq: Every day | ORAL | Status: DC
  Administered 2019-08-27 – 2019-08-31 (×5): 40 mg via ORAL
  Filled 2019-08-26 (×6): qty 1

## 2019-08-26 MED ORDER — SUCCINYLCHOLINE CHLORIDE 200 MG/10ML IV SOSY
PREFILLED_SYRINGE | INTRAVENOUS | Status: DC | PRN
  Administered 2019-08-26: 120 mg via INTRAVENOUS

## 2019-08-26 MED ORDER — CLEVIDIPINE BUTYRATE 0.5 MG/ML IV EMUL
INTRAVENOUS | Status: DC | PRN
  Administered 2019-08-26: 4 mg/h via INTRAVENOUS

## 2019-08-26 MED ORDER — PROTAMINE SULFATE 10 MG/ML IV SOLN
INTRAVENOUS | Status: DC | PRN
  Administered 2019-08-26: 10 mg via INTRAVENOUS
  Administered 2019-08-26: 40 mg via INTRAVENOUS

## 2019-08-26 MED ORDER — MORPHINE SULFATE (PF) 2 MG/ML IV SOLN: 2.0000 mg | INTRAVENOUS | Status: DC | PRN

## 2019-08-26 MED ORDER — OXYCODONE HCL 5 MG PO TABS: 5.0000 mg | ORAL_TABLET | ORAL | Status: DC | PRN

## 2019-08-26 MED ORDER — GUAIFENESIN-DM 100-10 MG/5ML PO SYRP: 15.0000 mL | ORAL_SOLUTION | ORAL | Status: DC | PRN

## 2019-08-26 MED ORDER — SODIUM CHLORIDE 0.9 % IV BOLUS
1000.0000 mL | Freq: Once | INTRAVENOUS | Status: AC
  Administered 2019-08-26: 1000 mL via INTRAVENOUS

## 2019-08-26 MED ORDER — SODIUM CHLORIDE 0.9 % IV SOLN
500.0000 mL | Freq: Once | INTRAVENOUS | Status: AC | PRN
  Administered 2019-08-26 (×2): 500 mL via INTRAVENOUS

## 2019-08-26 MED ORDER — ESMOLOL HCL 100 MG/10ML IV SOLN
INTRAVENOUS | Status: DC | PRN
  Administered 2019-08-26: 20 mg via INTRAVENOUS

## 2019-08-26 MED ORDER — HEPARIN (PORCINE) 25000 UT/250ML-% IV SOLN
800.0000 [IU]/h | INTRAVENOUS | Status: AC
  Administered 2019-08-26: 800 [IU]/h via INTRAVENOUS
  Filled 2019-08-26: qty 250

## 2019-08-26 MED ORDER — PHENOL 1.4 % MT LIQD: 1.0000 | OROMUCOSAL | Status: DC | PRN

## 2019-08-26 MED ORDER — POTASSIUM CHLORIDE CRYS ER 20 MEQ PO TBCR: 20.0000 meq | EXTENDED_RELEASE_TABLET | Freq: Every day | ORAL | Status: DC | PRN

## 2019-08-26 MED ORDER — LIDOCAINE 2% (20 MG/ML) 5 ML SYRINGE
INTRAMUSCULAR | Status: DC | PRN
  Administered 2019-08-26: 100 mg via INTRAVENOUS

## 2019-08-26 MED ORDER — 0.9 % SODIUM CHLORIDE (POUR BTL) OPTIME
TOPICAL | Status: DC | PRN
  Administered 2019-08-26: 1000 mL

## 2019-08-26 MED ORDER — PROPOFOL 10 MG/ML IV BOLUS
INTRAVENOUS | Status: DC | PRN
  Administered 2019-08-26: 150 mg via INTRAVENOUS

## 2019-08-26 MED ORDER — PHENYLEPHRINE HCL-NACL 10-0.9 MG/250ML-% IV SOLN
INTRAVENOUS | Status: DC | PRN
Start: 2019-08-26 — End: 2019-08-26
  Administered 2019-08-26: 50 ug/min via INTRAVENOUS

## 2019-08-26 MED ORDER — ALUM & MAG HYDROXIDE-SIMETH 200-200-20 MG/5ML PO SUSP: 15.0000 mL | ORAL | Status: DC | PRN

## 2019-08-26 MED ORDER — CEFAZOLIN SODIUM-DEXTROSE 2-4 GM/100ML-% IV SOLN
2.0000 g | Freq: Three times a day (TID) | INTRAVENOUS | Status: AC
  Administered 2019-08-26 (×2): 2 g via INTRAVENOUS
  Filled 2019-08-26 (×2): qty 100

## 2019-08-26 MED ORDER — MAGNESIUM SULFATE 2 GM/50ML IV SOLN: 2.0000 g | Freq: Every day | INTRAVENOUS | Status: DC | PRN

## 2019-08-26 MED ORDER — APIXABAN 5 MG PO TABS
5.0000 mg | ORAL_TABLET | Freq: Two times a day (BID) | ORAL | Status: DC
  Administered 2019-08-27 – 2019-08-31 (×9): 5 mg via ORAL
  Filled 2019-08-26 (×9): qty 1

## 2019-08-26 MED ORDER — PROPOFOL 10 MG/ML IV BOLUS
INTRAVENOUS | Status: AC
  Filled 2019-08-26: qty 20

## 2019-08-26 MED ORDER — LACTATED RINGERS IV SOLN: INTRAVENOUS | Status: DC | PRN

## 2019-08-26 MED ORDER — PROTAMINE SULFATE 10 MG/ML IV SOLN
INTRAVENOUS | Status: AC
  Filled 2019-08-26: qty 5

## 2019-08-26 MED ORDER — OXYCODONE HCL 5 MG PO TABS: 5.0000 mg | ORAL_TABLET | Freq: Once | ORAL | Status: DC | PRN

## 2019-08-26 SURGICAL SUPPLY — 46 items
ADH SKN CLS APL DERMABOND .7 (GAUZE/BANDAGES/DRESSINGS) ×1
CANISTER SUCT 3000ML PPV (MISCELLANEOUS) ×2 IMPLANT
CANNULA VESSEL 3MM 2 BLNT TIP (CANNULA) ×4 IMPLANT
CATH ROBINSON RED A/P 18FR (CATHETERS) ×1 IMPLANT
CLIP LIGATING EXTRA MED SLVR (CLIP) ×2 IMPLANT
CLIP LIGATING EXTRA SM BLUE (MISCELLANEOUS) ×2 IMPLANT
DECANTER SPIKE VIAL GLASS SM (MISCELLANEOUS) IMPLANT
DERMABOND ADVANCED (GAUZE/BANDAGES/DRESSINGS) ×1
DERMABOND ADVANCED .7 DNX12 (GAUZE/BANDAGES/DRESSINGS) ×1 IMPLANT
DRAIN HEMOVAC 1/8 X 5 (WOUND CARE) IMPLANT
DRAPE INCISE 23X17 IOBAN STRL (DRAPES) ×1
DRAPE INCISE 23X17 STRL (DRAPES) IMPLANT
DRAPE INCISE IOBAN 23X17 STRL (DRAPES) ×1 IMPLANT
ELECT REM PT RETURN 9FT ADLT (ELECTROSURGICAL) ×2
ELECTRODE REM PT RTRN 9FT ADLT (ELECTROSURGICAL) ×1 IMPLANT
EVACUATOR SILICONE 100CC (DRAIN) IMPLANT
GLOVE BIOGEL PI IND STRL 7.5 (GLOVE) IMPLANT
GLOVE BIOGEL PI INDICATOR 7.5 (GLOVE)
GLOVE SS BIOGEL STRL SZ 7.5 (GLOVE) ×1 IMPLANT
GLOVE SUPERSENSE BIOGEL SZ 7.5 (GLOVE)
GLOVE SURG SS PI 7.5 STRL IVOR (GLOVE) IMPLANT
GOWN STRL REUS W/ TWL LRG LVL3 (GOWN DISPOSABLE) ×3 IMPLANT
GOWN STRL REUS W/TWL LRG LVL3 (GOWN DISPOSABLE) ×6
KIT BASIN OR (CUSTOM PROCEDURE TRAY) ×1 IMPLANT
KIT SHUNT ARGYLE CAROTID ART 6 (VASCULAR PRODUCTS) IMPLANT
KIT SUCTION CATH 14FR (SUCTIONS) ×1 IMPLANT
KIT TURNOVER KIT B (KITS) ×2 IMPLANT
NEEDLE 22X1 1/2 (OR ONLY) (NEEDLE) IMPLANT
NS IRRIG 1000ML POUR BTL (IV SOLUTION) ×4 IMPLANT
PACK CAROTID (CUSTOM PROCEDURE TRAY) ×2 IMPLANT
PAD ARMBOARD 7.5X6 YLW CONV (MISCELLANEOUS) ×4 IMPLANT
PATCH HEMASHIELD 8X75 (Vascular Products) ×1 IMPLANT
POSITIONER HEAD DONUT 9IN (MISCELLANEOUS) ×2 IMPLANT
SHUNT CAROTID BYPASS 10 (VASCULAR PRODUCTS) ×1 IMPLANT
SHUNT CAROTID BYPASS 12FRX15.5 (VASCULAR PRODUCTS) IMPLANT
SOAP 2 % CHG 4 OZ (WOUND CARE) ×1 IMPLANT
SUT ETHILON 3 0 PS 1 (SUTURE) IMPLANT
SUT PROLENE 6 0 CC (SUTURE) ×3 IMPLANT
SUT SILK 3 0 (SUTURE)
SUT SILK 3-0 18XBRD TIE 12 (SUTURE) IMPLANT
SUT VIC AB 3-0 SH 27 (SUTURE) ×4
SUT VIC AB 3-0 SH 27X BRD (SUTURE) ×2 IMPLANT
SUT VICRYL 4-0 PS2 18IN ABS (SUTURE) ×2 IMPLANT
SYR CONTROL 10ML LL (SYRINGE) IMPLANT
TOWEL GREEN STERILE (TOWEL DISPOSABLE) ×2 IMPLANT
WATER STERILE IRR 1000ML POUR (IV SOLUTION) ×2 IMPLANT

## 2019-08-26 NOTE — Progress Notes (Signed)
PROGRESS NOTE    Jaclyn [name]  YIF:[id number] DOB: Apr 06, 1935 DOA: 08/21/2019 PCP: Jinny Sanders, MD      Brief Narrative:  Jaclyn [name] is a 84 y.o. F with hx A. fib on Eliquis, carotid stenosis, fibromyalgia, TIA, diabetes, hypothyroidism, HTN who presented with 2 days left-sided weakness as well as confusion.  In the ER, MRI showed acute ischemic stroke.           Assessment & Plan:  Acute ischemic stroke Work up and treatment are complete.  Admitted and MRI showed subcentimeter right corona radiata stroke.  In terms of secondary causes of stroke evaluation: Echocardiogram unremarkable Carotid imaging showed severe right carotid stenosis.  Vascular surgery was consulted and performed CEA as below.  In terms of stroke risk reduction: LDL not at goal, atorvastatin high intensity ordered Aspirin started at admission Non-smoker    Carotid artery disease, asymptomatic S/p right CEA by Dr. Donnetta Hutching on 7/9 Carotid imaging performed after stroke showed high-grade stenosis.  Vascular surgery consulted, recommended unilateral CEA.  CEA completed 7/9, uncomplicated. -Postsurgical care per [facility]VS     Delirium superimposed on MCI vs early dementia No prior history of diagnosed dementia, but likely some MCI, especially since Covid last year.  This was complicated by her hard of hearing.  Delirium precautions:   -Lights and TV off, minimize interruptions at night  -Blinds open and lights on during day  -Glasses/hearing aid with patient  -Frequent reorientation  -PT/OT when able  -Avoid sedation medications/Beers list medications    Permanent atrial fibrillation -Hold Eliquis today -Discussed with Dr. Donnetta Hutching, will restart Eliquis tomorrow morning   Type 2 diabetes, insulin-dependent, no complications Blood sugars improved now -Continue Levemir -Continue sliding scale correction insulin -Continue linagliptin   Hypothyroidism -Continue levothyroxine  B12  deficiency -Continue B12   History of recurrent UTIs -Continue prophylactic trimethoprim  Fibromyalgia Not on treatment  Hypertension Chart history, not on meds at home BP low back off pressure postoperatively, appears normal 110/60 on art line.  Hypomagnesemia Repleted          Disposition: Status is: Inpatient  Remains inpatient appropriate because: The patient has had carotid endarterectomy today, she will need postoperative care, disposition per vascular surgery   Dispo: The patient is from: Home              Anticipated d/c is to: SNF              Anticipated d/c date is: per vascular surgery              Patient currently is not medically stable to d/c.              MDM: The below labs and imaging reports reviewed and summarized above.  Medication management as above.      DVT prophylaxis: to restart full anticoagulation postoperatively  Code Status: Full code Family Communication: Granddaughter on the bedside    Consultants:   Neurology  Vascular surgery  Procedures:   7/4 CT head  7/4 MRI brain subcentimeter periventricular white matter infarct right frontal corona radiata  7/5 CT angiogram head and neck --neck severe atherosclerosis progressed since 2019 CTA, new severe stenosis distal left vertebral artery, radiographic string sign stenosis of the proximal right ICA, proximal left 70% stenosis left ICA, severe bilateral PCA P1 M2 stenoses  7/7 coronary CT -- 82 percentile calcium score, aggressive risk factor reduction recommended  7/9 right CEA by Dr. Donnetta Hutching  Antimicrobials:  Culture data:              Subjective: Patient is still somewhat distractible, confused, but able to be reoriented, and is interactive and compliant with commands.  She complains of no chest pain, headache, neck pain, dyspnea, abdominal pain.  No focal weakness or numbness.    Objective: Vitals:   08/26/19 1245 08/26/19 1312 08/26/19  1412 08/26/19 1512  BP: (!) 85/43 (!) 86/66 (!) 89/57 (!) 83/49  Pulse:  74 (!) 56 (!) 55  Resp: 12 20 19 15   Temp: 97.8 F (36.6 C) 98.2 F (36.8 C) 98.1 F (36.7 C) 98 F (36.7 C)  TempSrc:  Oral    SpO2: 97% 100% 100% 100%  Weight:      Height:        Intake/Output Summary (Last 24 hours) at 08/26/2019 1602 Last data filed at 08/26/2019 1551 Gross per 24 hour  Intake 3222.86 ml  Output 50 ml  Net 3172.86 ml   Filed Weights   08/21/19 1857  Weight: 66 kg    Examination: General appearance: Elderly adult female, lying in bed, no acute distress, sleeping but easily arousable.     HEENT: Anicteric, conjunctival pink, lids and lashes normal.  No nasal deformity, discharge, or epistaxis.  Lips moist, partially edentulous, oropharynx moist, no oral lesions, hearing very diminished. Skin: Incision glued shot on the right neck, appears well. Cardiac: RRR, no murmurs, no lower extremity edema. Respiratory: Normal respiratory rate and rhythm, lungs clear without rales or wheezes. Abdomen: No tenderness palpation or guarding.  Distention or ascites. MSK:  Neuro: Extraocular movements intact, strength 5/5 in the upper and lower extremities bilaterally.  No numbness.  She is oriented to self, daughter, situation.  She is very hard of hearing.  Speech is fluent. Psych: Attention seems normal, she is somewhat distractible, but this is apparently from hard of hearing, less from delirium or encephalopathy.    Data Reviewed: I have personally reviewed following labs and imaging studies:  CBC: Recent Labs  Lab 08/21/19 1939 08/21/19 1939 08/22/19 1137 08/23/19 0425 08/24/19 0741 08/25/19 0424 08/26/19 0602  WBC 6.7   < > 7.2 5.5 5.1 6.4 5.7  NEUTROABS 4.3  --  4.6 3.5  --   --   --   HGB 11.4*   < > 12.2 12.0 11.6* 11.3* 11.8*  HCT 34.3*   < > 36.0 36.2 34.9* 34.6* 36.6  MCV 90.0   < > 88.9 88.1 89.7 89.4 89.9  PLT 197   < > 217 204 206 205 221   < > = values in this interval not  displayed.   Basic Metabolic Panel: Recent Labs  Lab 08/21/19 1939 08/22/19 1137 08/23/19 0425 08/24/19 0741 08/25/19 0424  NA 132* 139 142 140 140  K 3.9 3.6 3.0* 4.2 3.7  CL 101 107 108 109 108  CO2 21* 21* 23 21* 24  GLUCOSE 360* 127* 165* 318* 259*  BUN 9 6* 5* 10 <5*  CREATININE 0.75 0.65 0.61 0.85 0.72  CALCIUM 8.4* 8.7* 8.8* 8.8* 8.9  MG  --   --  1.4* 1.6* 2.0  PHOS  --   --  3.8 3.2  --    GFR: Estimated Creatinine Clearance: 48.4 mL/min (by C-G formula based on SCr of 0.72 mg/dL). Liver Function Tests: Recent Labs  Lab 08/21/19 1939 08/22/19 1137 08/23/19 0425 08/24/19 0741 08/25/19 0424  AST 20 19 20 21 20   ALT 18 16 16 17  18  ALKPHOS 62 60 57 63 63  BILITOT 1.1 0.6 1.0 0.6 0.6  PROT 5.8* 6.4* 6.1* 6.0* 6.3*  ALBUMIN 3.2* 3.4* 3.1* 3.0* 3.2*   No results for input(s): LIPASE, AMYLASE in the last 168 hours. Recent Labs  Lab 08/22/19 1629  AMMONIA 26   Coagulation Profile: No results for input(s): INR, PROTIME in the last 168 hours. Cardiac Enzymes: No results for input(s): CKTOTAL, CKMB, CKMBINDEX, TROPONINI in the last 168 hours. BNP (last 3 results) No results for input(s): PROBNP in the last 8760 hours. HbA1C: No results for input(s): HGBA1C in the last 72 hours. CBG: Recent Labs  Lab 08/25/19 1112 08/25/19 1701 08/25/19 2133 08/26/19 0611 08/26/19 1005  GLUCAP 230* 216* 184* 172* 141*   Lipid Profile: No results for input(s): CHOL, HDL, LDLCALC, TRIG, CHOLHDL, LDLDIRECT in the last 72 hours. Thyroid Function Tests: No results for input(s): TSH, T4TOTAL, FREET4, T3FREE, THYROIDAB in the last 72 hours. Anemia Panel: No results for input(s): VITAMINB12, FOLATE, FERRITIN, TIBC, IRON, RETICCTPCT in the last 72 hours. Urine analysis:    Component Value Date/Time   COLORURINE YELLOW 08/21/2019 2040   APPEARANCEUR CLEAR 08/21/2019 2040   LABSPEC 1.027 08/21/2019 2040   PHURINE 5.0 08/21/2019 2040   GLUCOSEU >=500 (A) 08/21/2019 2040    GLUCOSEU >=1000 (A) 03/16/2019 0915   HGBUR NEGATIVE 08/21/2019 2040   BILIRUBINUR NEGATIVE 08/21/2019 2040   BILIRUBINUR negative 12/18/2017 1613   KETONESUR 20 (A) 08/21/2019 2040   PROTEINUR NEGATIVE 08/21/2019 2040   UROBILINOGEN 0.2 03/16/2019 0915   NITRITE NEGATIVE 08/21/2019 2040   LEUKOCYTESUR NEGATIVE 08/21/2019 2040   Sepsis Labs: @LABRCNTIP (procalcitonin:4,lacticacidven:4)  ) Recent Results (from the past 240 hour(s))  Urine culture     Status: Abnormal   Collection Time: 08/21/19  8:44 PM   Specimen: Urine, Clean Catch  Result Value Ref Range Status   Specimen Description URINE, CLEAN CATCH  Final   Special Requests   Final    NONE Performed at [facility], [place] [address] [place], [place] [address]    Culture (A)  Final    30,000 COLONIES/mL MULTIPLE SPECIES PRESENT, SUGGEST RECOLLECTION   Report Status 08/23/2019 FINAL  Final  SARS Coronavirus 2 by RT PCR (hospital order, performed in [facility] hospital lab) Nasopharyngeal Nasopharyngeal Swab     Status: None   Collection Time: 08/22/19  1:04 AM   Specimen: Nasopharyngeal Swab  Result Value Ref Range Status   SARS Coronavirus 2 NEGATIVE NEGATIVE Final    Comment: (NOTE) SARS-CoV-2 target nucleic acids are NOT DETECTED.  The SARS-CoV-2 RNA is generally detectable in upper and lower respiratory specimens during the acute phase of infection. The lowest concentration of SARS-CoV-2 viral copies this assay can detect is 250 copies / mL. A negative result does not preclude SARS-CoV-2 infection and should not be used as the sole basis for treatment or other patient management decisions.  A negative result may occur with improper specimen collection / handling, submission of specimen other than nasopharyngeal swab, presence of viral mutation(s) within the areas targeted by this assay, and inadequate number of viral copies (<250 copies / mL). A negative result must be combined with clinical observations,  patient history, and epidemiological information.  Fact Sheet for Patients:   StrictlyIdeas.no  Fact Sheet for Healthcare Providers: BankingDealers.co.za  This test is not yet approved or  cleared by the [place] FDA and has been authorized for detection and/or diagnosis of SARS-CoV-2 by FDA under an Emergency Use Authorization (  EUA).  This EUA will remain in effect (meaning this test can be used) for the duration of the COVID-19 declaration under Section 564(b)(1) of the Act, 21 U.S.C. section 360bbb-3(b)(1), unless the authorization is terminated or revoked sooner.  Performed at [facility], [place] [address] [place], [place] [address]   Surgical PCR screen     Status: None   Collection Time: 08/25/19  9:47 PM   Specimen: Nasal Mucosa; Nasal Swab  Result Value Ref Range Status   MRSA, PCR NEGATIVE NEGATIVE Final   Staphylococcus aureus NEGATIVE NEGATIVE Final    Comment: (NOTE) The Xpert SA Assay (FDA approved for NASAL specimens in patients 35 years of age and older), is one component of a comprehensive surveillance program. It is not intended to diagnose infection nor to guide or monitor treatment. Performed at [facility], [place] [address] [place], [place] [address]          Radiology Studies: No results found.      Scheduled Meds: . aspirin EC  81 mg Oral Daily  . atorvastatin  40 mg Oral Daily  . [START ON 08/27/2019] docusate sodium  100 mg Oral Daily  . insulin aspart  0-15 Units Subcutaneous TID WC  . insulin aspart  0-5 Units Subcutaneous QHS  . insulin aspart  4 Units Subcutaneous TID WC  . insulin detemir  35 Units Subcutaneous BID  . levothyroxine  112 mcg Oral Daily  . linagliptin  5 mg Oral Daily  . mupirocin ointment  1 application Nasal BID  . pantoprazole  40 mg Oral Daily  . trimethoprim  100 mg Oral Daily  . vitamin B-12  1,000 mcg Oral Daily   Continuous Infusions: .   ceFAZolin (ANCEF) IV    . heparin    . magnesium sulfate bolus IVPB       LOS: 4 days    Time spent: 25 minutes    Edwin Dada, MD [facility] 08/26/2019, 4:02 PM     Please page though [place] or Epic secure chat:  For [employer], Adult nurse

## 2019-08-26 NOTE — Interval H&P Note (Signed)
History and Physical Interval Note:  08/26/2019 7:20 AM  Jaclyn [name]  has presented today for surgery, with the diagnosis of CAROTID ARTERY STENOSIS.  The various methods of treatment have been discussed with the patient and family. After consideration of risks, benefits and other options for treatment, the patient has consented to  Procedure(s): ENDARTERECTOMY CAROTID (Right) as a surgical intervention.  The patient's history has been reviewed, patient examined, no change in status, stable for surgery.  I have reviewed the patient's chart and labs.  Questions were answered to the patient's satisfaction.     Curt Jews

## 2019-08-26 NOTE — Progress Notes (Addendum)
[place] for Eliquis > Heparin > Eliquis Indication: chest pain/ACS and atrial fibrillation  Allergies  Allergen Reactions  . Naproxen Sodium Other (See Comments)    Fever/aches and pains  . Statins Other (See Comments)    myalgias  . Sulfonamide Derivatives Hives and Itching  . Latex Itching and Rash  . Shellfish Allergy Itching, Swelling and Rash    Seafood, shrimp    Patient Measurements: Height: 5\' 2"  (157.5 cm) Weight: 66 kg (145 lb 8.1 oz) IBW/kg (Calculated) : 50.1  Vital Signs: Temp: 98.1 F (36.7 C) (07/09 1600) Temp Source: Oral (07/09 1600) BP: 86/59 (07/09 1600) Pulse Rate: 64 (07/09 1600)  Labs: Recent Labs    08/24/19 0741 08/24/19 0741 08/25/19 0424 08/26/19 0602  HGB 11.6*   < > 11.3* 11.8*  HCT 34.9*  --  34.6* 36.6  PLT 206  --  205 221  APTT 75*  --  79* 86*  HEPARINUNFRC 1.60*  --  0.67 0.55  CREATININE 0.85  --  0.72  --    < > = values in this interval not displayed.    Estimated Creatinine Clearance: 48.4 mL/min (by C-G formula based on SCr of 0.72 mg/dL).  Assessment: 84 year old female on heparin while Eliquis is on hold. Pt with carotid stenosis s/p CEA on 7/9, heparin to stop at 6 am - restarting post op.  Pt is currently receiving heparin infusion at 800 units/hr (restarted at lower rate, with aim to keep pt on low end of goal S/P procedure). CBC stable. Per RN, no bleeding observed post procedure. Pharmacy is now consulted to transition pt back to apixaban tomorrow at 10 AM.  Goal of Therapy:  aPTT:  66-102 sec Heparin level: 0.3-0.7 units/ml Monitor platelets by anticoagulation protocol: Yes   Plan:  Continue heparin infusion at 800 units/hr Monitor aPTT, heparin level 7 hrs after restarting infusion Stop heparin infusion at 10 AM tomorrow and restart apixaban 5 mg po BID at that that time Monitor daily heparin level, CBC Monitor for signs/symptoms of bleeding  Gillermina Hu, PharmD,  BCPS, [facility] Clinical Pharmacist 08/26/2019 4:28 PM

## 2019-08-26 NOTE — Progress Notes (Signed)
Pt's BP continues to be low. Vascular PA notified. 1L NS bolus given per verbal order from [place], [place].

## 2019-08-26 NOTE — Op Note (Signed)
   OPERATIVE REPORT  DATE OF SURGERY: 08/26/2019  PATIENT: Jaclyn [name], 84 y.o. female MRN: [id number]  DOB: 1935-05-01  PRE-OPERATIVE DIAGNOSIS: Right Carotid Stenosis, Symptomatic  POST-OPERATIVE DIAGNOSIS:  Same  PROCEDURE:  Right Carotid Endarterectomy with Dacron Patch Angioplasty  SURGEON:  Curt Jews, M.D.  PHYSICIAN ASSISTANT: [place]  The assistant was needed for exposure and to expedite the case  ANESTHESIA:   general  EBL: Less than 200 ml  Total I/O In: 800 [I.V.:800] Out: 50 [Blood:50]  BLOOD ADMINISTERED: none  DRAINS: none   SPECIMEN: none  COUNTS CORRECT:  YES  PLAN OF CARE: Admit to inpatient   PATIENT DISPOSITION:  PACU - hemodynamically stable and neurologically intact.  PROCEDURE DETAILS: The patient was taken to the operating room placed in supine position.  General anesthesia was administered.  The neck was prepped and draped in the usual sterile fashion.  An incision was made anterior to the sternocleidomastoid and carried down through the platysma with electrocautery.  The sternocleidomastoid was reflected posteriorly and the carotid sheath was opened.  The facial vein was ligated with 2-0 silk ties and divided.  The common carotid artery was encircled with an umbilical tape and Rummel tourniquet.  The vagus nerve was identified and preserved.  Dissection was continued onto the carotid bifurcation.  The superior thyroid artery was encircled with a 2-0 silk Potts tie.  The external carotid was encircled with a blue vessel loop and the internal carotid was encircled with an umbilical tape and Rummel tourniquet.  The hypoglossal nerve was identified and preserved.  The patient was given systemic heparin and after adequate circulation time, the internal, external and common carotid arteries were occluded with vascular clamps.  The common carotid artery was opened with an 11 blade and extended  longitudinally with Potts scissors.  A 10 shunt was passed up  the internal carotid and allowed to backbleed.  It was then passed down the common carotid where it was secured with Rummel tourniquet.  The endarterectomy was begun on the common carotid artery and the plaque was divided proximally with Potts scissors.  The endarterectomy was continued onto the bifurcation.  The external carotid was endarterectomized with an eversion technique and the internal carotid was endarterectomized in an open fashion.  Remaining atheromatous debris was removed from the endarterectomy plane.  A Finesse Hemashield Dacron patch was brought onto the field and was sewn as a patch angioplasty with a running 6-0 Prolene suture.  Prior to completion of the closure the shunt was removed and the usual flushing maneuvers were undertaken.  The anastomosis was completed and flow was restored first to the external and then the internal carotid artery.  Excellent flow characteristics were noted with hand-held Doppler in the internal and external carotid arteries.  The patient was given 50 mg of protamine to reverse the heparin.  The wounds were irrigated with saline.  Hemostasis was obtained with electrocautery.  The wounds were closed with 3-0 Vicryl to reapproximate the sternocleidomastoid over the carotid sheath.  The platysma was lysed with a running 3-0 Vicryl suture.  The skin was closed with a 4-0 subcuticular Vicryl stitch.  Dermabond was applied.  The patient was awakened neurologically intact in the operating room and transferred to the recovery room in stable condition   Curt Jews, M.D. 08/26/2019 10:07 AM

## 2019-08-26 NOTE — Progress Notes (Signed)
PT Cancellation Note  Patient Details Name: Di Jasmer MRN: [id number] DOB: November 21, 1935   Cancelled Treatment:    Reason Eval/Treat Not Completed: Patient at procedure or test/unavailable. Pt out of room on several attempts this morning. Will check back as schedule allows to continue with PT POC.    LEXII WALSH 08/26/2019, 11:31 AM   Rolinda Roan, PT, DPT Acute Rehabilitation Services Pager: [phone] Office: [phone]

## 2019-08-26 NOTE — Anesthesia Procedure Notes (Signed)
Arterial Line Insertion Start/End7/10/2019 7:00 AM, 08/26/2019 7:15 AM Performed by: Leonor Liv, CRNA, CRNA  Patient location: Pre-op. Preanesthetic checklist: patient identified, IV checked, site marked, risks and benefits discussed, surgical consent, monitors and equipment checked, pre-op evaluation, timeout performed and anesthesia consent Lidocaine 1% used for infiltration Left, radial was placed Catheter size: 20 G  Attempts: 1 Procedure performed without using ultrasound guided technique. Following insertion, dressing applied and Biopatch. Post procedure assessment: normal  Patient tolerated the procedure well with no immediate complications. Additional procedure comments: Hx dementia- discussed arterial line placement with daughter at bedside. Marland Kitchen

## 2019-08-26 NOTE — Transfer of Care (Signed)
Immediate Anesthesia Transfer of Care Note  Patient: Jaclyn [name]  Procedure(s) Performed: RIGHT CAROTID ENDARTERECTOMY (Right Neck) PATCH ANGIOPLASTY OF RIGHT COMMON CAROTID ARTERY USING HEMASHIELD PLATINUM FINESSE PATCH (Right Neck)  Patient Location: PACU  Anesthesia Type:General  Level of Consciousness: awake and alert   Airway & Oxygen Therapy: Patient Spontanous Breathing and Patient connected to nasal cannula oxygen  Post-op Assessment: Report given to RN, Post -op Vital signs reviewed and stable and Patient moving all extremities  Post vital signs: Reviewed and stable  Last Vitals:  Vitals Value Taken Time  BP 110/48 08/26/19 1000  Temp    Pulse 71 08/26/19 1010  Resp 16 08/26/19 1010  SpO2 95 % 08/26/19 1010  Vitals shown include unvalidated device data.  Last Pain:  Vitals:   08/26/19 0500  TempSrc:   PainSc: Asleep         Complications: No complications documented.

## 2019-08-26 NOTE — Anesthesia Procedure Notes (Signed)
Procedure Name: Intubation Date/Time: 08/26/2019 7:54 AM Performed by: Leonor Liv, CRNA Pre-anesthesia Checklist: Patient identified, Emergency Drugs available, Suction available and Patient being monitored Patient Re-evaluated:Patient Re-evaluated prior to induction Oxygen Delivery Method: Circle System Utilized Preoxygenation: Pre-oxygenation with 100% oxygen Induction Type: IV induction Ventilation: Mask ventilation without difficulty Laryngoscope Size: Glidescope and 3 Grade View: Grade I Tube type: Oral Tube size: 7.0 mm Number of attempts: 1 Airway Equipment and Method: Stylet and Oral airway Placement Confirmation: ETT inserted through vocal cords under direct vision,  positive ETCO2 and breath sounds checked- equal and bilateral Secured at: 21 cm Tube secured with: Tape Dental Injury: Teeth and Oropharynx as per pre-operative assessment

## 2019-08-26 NOTE — Progress Notes (Signed)
[name] for Eliquis > Heparin Indication: chest pain/ACS and atrial fibrillation  Allergies  Allergen Reactions  . Naproxen Sodium Other (See Comments)    Fever/aches and pains  . Statins Other (See Comments)    myalgias  . Sulfonamide Derivatives Hives and Itching  . Latex Itching and Rash  . Shellfish Allergy Itching, Swelling and Rash    Seafood, shrimp    Patient Measurements: Height: 5\' 2"  (157.5 cm) Weight: 66 kg (145 lb 8.1 oz) IBW/kg (Calculated) : 50.1  Vital Signs: Temp: 98.1 F (36.7 C) (07/09 1412) Temp Source: Oral (07/09 1312) BP: 89/57 (07/09 1412) Pulse Rate: 56 (07/09 1412)  Labs: Recent Labs    08/24/19 0741 08/24/19 0741 08/25/19 0424 08/26/19 0602  HGB 11.6*   < > 11.3* 11.8*  HCT 34.9*  --  34.6* 36.6  PLT 206  --  205 221  APTT 75*  --  79* 86*  HEPARINUNFRC 1.60*  --  0.67 0.55  CREATININE 0.85  --  0.72  --    < > = values in this interval not displayed.    Estimated Creatinine Clearance: 48.4 mL/min (by C-G formula based on SCr of 0.72 mg/dL).  Assessment: 84 year old female on heparin while Eliquis is on hold.  With carotid stenosis s/p CEA on  7/9, heparin to stop at 6 am - restart post op  CBC stable  Goal of Therapy:  aPTT 66-102 seconds seconds Monitor platelets by anticoagulation protocol: Yes   Plan:  Restart  heparin at 800 units / hr Draw Heparin level 6hr after restart Daily HL, CBC Monitor s/s bleeidng F/u restart oral AC  Bonnita Nasuti Pharm.D. [name], BCPS Clinical Pharmacist [phone] 08/26/2019 2:57 PM

## 2019-08-26 NOTE — Anesthesia Preprocedure Evaluation (Signed)
Anesthesia Evaluation  Patient identified by MRN, date of birth, ID band Patient awake    Reviewed: Allergy & Precautions, H&P , NPO status , Patient's Chart, lab work & pertinent test results  History of Anesthesia Complications (+) DIFFICULT AIRWAY and history of anesthetic complications  Airway Mallampati: II   Neck ROM: full    Dental   Pulmonary neg pulmonary ROS,    breath sounds clear to auscultation       Cardiovascular hypertension,  Rhythm:regular Rate:Normal     Neuro/Psych  Headaches, PSYCHIATRIC DISORDERS Depression Dementia TIA Neuromuscular disease CVA    GI/Hepatic GERD  ,  Endo/Other  diabetes, Type 2Hypothyroidism   Renal/GU      Musculoskeletal  (+) Arthritis , Fibromyalgia -  Abdominal   Peds  Hematology   Anesthesia Other Findings   Reproductive/Obstetrics                             Anesthesia Physical Anesthesia Plan  ASA: III  Anesthesia Plan: General   Post-op Pain Management:    Induction: Intravenous  PONV Risk Score and Plan: 3 and Ondansetron, Dexamethasone and Treatment may vary due to age or medical condition  Airway Management Planned: Oral ETT and Video Laryngoscope Planned  Additional Equipment: Arterial line  Intra-op Plan:   Post-operative Plan: Extubation in OR  Informed Consent: I have reviewed the patients History and Physical, chart, labs and discussed the procedure including the risks, benefits and alternatives for the proposed anesthesia with the patient or authorized representative who has indicated his/her understanding and acceptance.       Plan Discussed with: CRNA, Anesthesiologist and Surgeon  Anesthesia Plan Comments:         Anesthesia Quick Evaluation

## 2019-08-26 NOTE — Progress Notes (Addendum)
Pt arrived to 4e from PACU. Right carotid neck incision clean, dry, and intact. Telemetry box placed and CCMD notified. Vitals obtained. BP low, MD aware per PACU RN. Sinusbrady on the monitor. Pt answers orientation questions appropriately, however she keeps threatening to call the president because nobody brought her water. She was informed that she can have water at 2pm, when her NPO order has expired. Pt is very agitated with staff and does not want to participate in NIH assessments.

## 2019-08-27 LAB — CBC
HCT: 28.1 % — ABNORMAL LOW (ref 36.0–46.0)
Hemoglobin: 9.1 g/dL — ABNORMAL LOW (ref 12.0–15.0)
MCH: 30.1 pg (ref 26.0–34.0)
MCHC: 32.4 g/dL (ref 30.0–36.0)
MCV: 93 fL (ref 80.0–100.0)
Platelets: 208 10*3/uL (ref 150–400)
RBC: 3.02 MIL/uL — ABNORMAL LOW (ref 3.87–5.11)
RDW: 15.3 % (ref 11.5–15.5)
WBC: 8.3 10*3/uL (ref 4.0–10.5)
nRBC: 0 % (ref 0.0–0.2)

## 2019-08-27 LAB — COMPREHENSIVE METABOLIC PANEL
ALT: 13 U/L (ref 0–44)
AST: 17 U/L (ref 15–41)
Albumin: 2.6 g/dL — ABNORMAL LOW (ref 3.5–5.0)
Alkaline Phosphatase: 46 U/L (ref 38–126)
Anion gap: 8 (ref 5–15)
BUN: 10 mg/dL (ref 8–23)
CO2: 20 mmol/L — ABNORMAL LOW (ref 22–32)
Calcium: 8 mg/dL — ABNORMAL LOW (ref 8.9–10.3)
Chloride: 114 mmol/L — ABNORMAL HIGH (ref 98–111)
Creatinine, Ser: 0.75 mg/dL (ref 0.44–1.00)
GFR calc Af Amer: >60 mL/min (ref >60)
GFR calc non Af Amer: >60 mL/min (ref >60)
Glucose, Bld: 120 mg/dL — ABNORMAL HIGH (ref 70–99)
Potassium: 3.7 mmol/L (ref 3.5–5.1)
Sodium: 142 mmol/L (ref 135–145)
Total Bilirubin: 0.6 mg/dL (ref 0.3–1.2)
Total Protein: 5.2 g/dL — ABNORMAL LOW (ref 6.5–8.1)

## 2019-08-27 LAB — MAGNESIUM: Magnesium: 1.4 mg/dL — ABNORMAL LOW (ref 1.7–2.4)

## 2019-08-27 LAB — APTT: aPTT: 108 seconds — ABNORMAL HIGH (ref 24–36)

## 2019-08-27 LAB — GLUCOSE, CAPILLARY
Glucose-Capillary: 144 mg/dL — ABNORMAL HIGH (ref 70–99)
Glucose-Capillary: 252 mg/dL — ABNORMAL HIGH (ref 70–99)
Glucose-Capillary: 259 mg/dL — ABNORMAL HIGH (ref 70–99)
Glucose-Capillary: 190 mg/dL — ABNORMAL HIGH (ref 70–99)

## 2019-08-27 LAB — HEPARIN LEVEL (UNFRACTIONATED): Heparin Unfractionated: 0.53 IU/mL (ref 0.30–0.70)

## 2019-08-27 MED ORDER — DOPAMINE-DEXTROSE 3.2-5 MG/ML-% IV SOLN
3.0000 ug/kg/min | INTRAVENOUS | Status: DC
  Administered 2019-08-27: 3 ug/kg/min via INTRAVENOUS
  Filled 2019-08-27: qty 250

## 2019-08-27 MED ORDER — SODIUM CHLORIDE 0.9 % IV BOLUS
1000.0000 mL | Freq: Once | INTRAVENOUS | Status: AC
  Administered 2019-08-27: 1000 mL via INTRAVENOUS

## 2019-08-27 NOTE — Progress Notes (Signed)
Dopamine stopped at 1216 BP 131/75 (88) Art line 117/27 (53). Restarted at 1308 @ 3 mcg/kg/min BP 89/69 (75) Art line 97/15 (37) repeat BP 88/72 (79) Art line 87/14 (33).

## 2019-08-27 NOTE — Progress Notes (Signed)
ANTICOAGULATION CONSULT NOTE - Follow Up Consult  Pharmacy Consult for heparin Indication: atrial fibrillation  Labs: Recent Labs    08/24/19 0741 08/24/19 0741 08/25/19 0424 08/26/19 0602 08/27/19 0030  HGB 11.6*   < > 11.3* 11.8*  --   HCT 34.9*  --  34.6* 36.6  --   PLT 206  --  205 221  --   APTT 75*   < > 79* 86* 108*  HEPARINUNFRC 1.60*   < > 0.67 0.55 0.53  CREATININE 0.85  --  0.72  --   --    < > = values in this interval not displayed.    Assessment/Plan:  84yo female therapeutic on heparin with plan to resume Eliquis in am. Will continue gtt at current rate until first dose of Eliquis.   Wynona Neat, PharmD, BCPS  08/27/2019,1:26 AM

## 2019-08-27 NOTE — Progress Notes (Signed)
PROGRESS NOTE    Jaclyn [name]  SFK:[id number] DOB: 1935/07/06 DOA: 08/21/2019 PCP: Jinny Sanders, MD      Brief Narrative:  Jaclyn [name] is a 84 y.o. F with hx A. fib on Eliquis, carotid stenosis, fibromyalgia, TIA, diabetes, hypothyroidism, HTN who presented with 2 days left-sided weakness as well as confusion.  In the ER, MRI showed acute ischemic stroke.           Assessment & Plan:  Acute ischemic stroke Work up and treatment are complete.  Admitted and MRI showed subcentimeter right corona radiata stroke.  In terms of secondary causes of stroke evaluation: Echocardiogram unremarkable Carotid imaging showed severe right carotid stenosis.  Vascular surgery was consulted and performed CEA as below.  In terms of stroke risk reduction: LDL not at goal, continue high intensity atorvastatin Aspirin started at admission, continue aspirin 81 and Eliquis Non-smoker    Carotid artery disease, asymptomatic S/p right CEA by Dr. Donnetta Hutching on 7/9 Carotid imaging performed after stroke showed high-grade stenosis.  Vascular surgery consulted, recommended unilateral CEA.  CEA completed 7/9 complicated by postoperative hypotension requiring dopamine. -Postsurgical care per [facility]   Hypotension This began intra-operatively, and continues now.  The patient has no signs or symptoms of infection, and I doubt septic shock.  She was given fluids without improvement, doubt hypokalemia.  She has no signs of cardiogenic shock. -Defer dopamine to Vascular surgery -Continue IV fluids   Delirium superimposed on MCI vs early dementia No prior history of diagnosed dementia, but likely some MCI, especially since Covid last year.  This was complicated by her hard of hearing.  Delirium precautions:   -Lights and TV off, minimize interruptions at night  -Blinds open and lights on during day  -Glasses/hearing aid with patient  -Frequent reorientation  -PT/OT when able  -Avoid sedation  medications/Beers list medications    Permanent atrial fibrillation -Resume Eliquis  Type 2 diabetes, insulin-dependent, no complications Blood sugars elevated this afternoon -Continue Levemir -Continue sliding scale correction insulin -Continue linagliptin   Hypothyroidism -Continue levothyroxine  B12 deficiency -Continue B12   History of recurrent UTI -Continue prophylactic trimethoprim  Fibromyalgia Not on treatment  Hypertension Chart history, not on meds at home   Hypomagnesemia Repleted          Disposition: Status is: Inpatient  Remains inpatient appropriate because: The patient has had carotid endarterectomy today, she will need postoperative care, disposition per vascular surgery   Dispo: The patient is from: Home              Anticipated d/c is to: SNF              Anticipated d/c date is: per vascular surgery              Patient currently is not medically stable to d/c.              MDM: The below labs and imaging reports reviewed and summarized above.  Medication management as above.       DVT prophylaxis: to restart full anticoagulation postoperatively  Code Status: Full code Family Communication: Granddaughter on the bedside    Consultants:   Neurology  Vascular surgery  Procedures:   7/4 CT head  7/4 MRI brain subcentimeter periventricular white matter infarct right frontal corona radiata  7/5 CT angiogram head and neck --neck severe atherosclerosis progressed since 2019 CTA, new severe stenosis distal left vertebral artery, radiographic string sign stenosis of the proximal right ICA, proximal left  70% stenosis left ICA, severe bilateral PCA P1 M2 stenoses  7/7 coronary CT -- 82 percentile calcium score, aggressive risk factor reduction recommended  7/9 right CEA by Dr. Donnetta Hutching  Antimicrobials:      Culture data:              Subjective: Patient feels fine.  She is a little bit woozy with  standing.  She has no fever, cough, dysuria.    Objective: Vitals:   08/27/19 1357 08/27/19 1439 08/27/19 1500 08/27/19 1600  BP: (!) 129/59 111/62 (!) 113/55 (!) 109/46  Pulse:      Resp: 11 18 (!) 21 18  Temp:      TempSrc:      SpO2:      Weight:      Height:        Intake/Output Summary (Last 24 hours) at 08/27/2019 1702 Last data filed at 08/27/2019 0615 Gross per 24 hour  Intake 198.96 ml  Output 300 ml  Net -101.04 ml   Filed Weights   08/21/19 1857  Weight: 66 kg    Examination: General appearance: Elderly adult female, sitting in chair, eating lunch, interactive, pleasant.     HEENT: Anicteric, conjunctival pink, lids and lashes normal.  Partially edentulous, oropharynx moist, no oral lesions. Skin: Neck incision looks clean dry and intact. Cardiac: RRR, no murmurs, no lower extremity edema Respiratory: Normal respiratory rate and rhythm, lungs clear without rales wheezing Abdomen: No tenderness to palpation or guarding, no distention or ascites. MSK:  Neuro: Extraocular movements intact, strength 5/5 in the upper and lower extremities bilaterally, no numbness, oriented to self, daughter, situation, very hard of hearing, speech fluent. Psych: Attention seems diminished, she is a little bit tangential.     Data Reviewed: I have personally reviewed following labs and imaging studies:  CBC: Recent Labs  Lab 08/21/19 1939 08/21/19 1939 08/22/19 1137 08/22/19 1137 08/23/19 0425 08/24/19 0741 08/25/19 0424 08/26/19 0602 08/27/19 0320  WBC 6.7   < > 7.2   < > 5.5 5.1 6.4 5.7 8.3  NEUTROABS 4.3  --  4.6  --  3.5  --   --   --   --   HGB 11.4*   < > 12.2   < > 12.0 11.6* 11.3* 11.8* 9.1*  HCT 34.3*   < > 36.0   < > 36.2 34.9* 34.6* 36.6 28.1*  MCV 90.0   < > 88.9   < > 88.1 89.7 89.4 89.9 93.0  PLT 197   < > 217   < > 204 206 205 221 208   < > = values in this interval not displayed.   Basic Metabolic Panel: Recent Labs  Lab 08/22/19 1137  08/23/19 0425 08/24/19 0741 08/25/19 0424 08/27/19 0320  NA 139 142 140 140 142  K 3.6 3.0* 4.2 3.7 3.7  CL 107 108 109 108 114*  CO2 21* 23 21* 24 20*  GLUCOSE 127* 165* 318* 259* 120*  BUN 6* 5* 10 <5* 10  CREATININE 0.65 0.61 0.85 0.72 0.75  CALCIUM 8.7* 8.8* 8.8* 8.9 8.0*  MG  --  1.4* 1.6* 2.0 1.4*  PHOS  --  3.8 3.2  --   --    GFR: Estimated Creatinine Clearance: 48.4 mL/min (by C-G formula based on SCr of 0.75 mg/dL). Liver Function Tests: Recent Labs  Lab 08/22/19 1137 08/23/19 0425 08/24/19 0741 08/25/19 0424 08/27/19 0320  AST 19 20 21 20 17   ALT 16 16 17  18 13  ALKPHOS 60 57 63 63 46  BILITOT 0.6 1.0 0.6 0.6 0.6  PROT 6.4* 6.1* 6.0* 6.3* 5.2*  ALBUMIN 3.4* 3.1* 3.0* 3.2* 2.6*   No results for input(s): LIPASE, AMYLASE in the last 168 hours. Recent Labs  Lab 08/22/19 1629  AMMONIA 26   Coagulation Profile: No results for input(s): INR, PROTIME in the last 168 hours. Cardiac Enzymes: No results for input(s): CKTOTAL, CKMB, CKMBINDEX, TROPONINI in the last 168 hours. BNP (last 3 results) No results for input(s): PROBNP in the last 8760 hours. HbA1C: No results for input(s): HGBA1C in the last 72 hours. CBG: Recent Labs  Lab 08/26/19 1005 08/26/19 1605 08/26/19 2131 08/27/19 0630 08/27/19 1209  GLUCAP 141* 147* 130* 144* 252*   Lipid Profile: No results for input(s): CHOL, HDL, LDLCALC, TRIG, CHOLHDL, LDLDIRECT in the last 72 hours. Thyroid Function Tests: No results for input(s): TSH, T4TOTAL, FREET4, T3FREE, THYROIDAB in the last 72 hours. Anemia Panel: No results for input(s): VITAMINB12, FOLATE, FERRITIN, TIBC, IRON, RETICCTPCT in the last 72 hours. Urine analysis:    Component Value Date/Time   COLORURINE YELLOW 08/21/2019 2040   APPEARANCEUR CLEAR 08/21/2019 2040   LABSPEC 1.027 08/21/2019 2040   PHURINE 5.0 08/21/2019 2040   GLUCOSEU >=500 (A) 08/21/2019 2040   GLUCOSEU >=1000 (A) 03/16/2019 0915   HGBUR NEGATIVE 08/21/2019 2040    BILIRUBINUR NEGATIVE 08/21/2019 2040   BILIRUBINUR negative 12/18/2017 1613   KETONESUR 20 (A) 08/21/2019 2040   PROTEINUR NEGATIVE 08/21/2019 2040   UROBILINOGEN 0.2 03/16/2019 0915   NITRITE NEGATIVE 08/21/2019 2040   LEUKOCYTESUR NEGATIVE 08/21/2019 2040   Sepsis Labs: @LABRCNTIP (procalcitonin:4,lacticacidven:4)  ) Recent Results (from the past 240 hour(s))  Urine culture     Status: Abnormal   Collection Time: 08/21/19  8:44 PM   Specimen: Urine, Clean Catch  Result Value Ref Range Status   Specimen Description URINE, CLEAN CATCH  Final   Special Requests   Final    NONE Performed at [facility], [place] [address], [place], [place] [address]    Culture (A)  Final    30,000 COLONIES/mL MULTIPLE SPECIES PRESENT, SUGGEST RECOLLECTION   Report Status 08/23/2019 FINAL  Final  SARS Coronavirus 2 by RT PCR (hospital order, performed in [facility] hospital lab) Nasopharyngeal Nasopharyngeal Swab     Status: None   Collection Time: 08/22/19  1:04 AM   Specimen: Nasopharyngeal Swab  Result Value Ref Range Status   SARS Coronavirus 2 NEGATIVE NEGATIVE Final    Comment: (NOTE) SARS-CoV-2 target nucleic acids are NOT DETECTED.  The SARS-CoV-2 RNA is generally detectable in upper and lower respiratory specimens during the acute phase of infection. The lowest concentration of SARS-CoV-2 viral copies this assay can detect is 250 copies / mL. A negative result does not preclude SARS-CoV-2 infection and should not be used as the sole basis for treatment or other patient management decisions.  A negative result may occur with improper specimen collection / handling, submission of specimen other than nasopharyngeal swab, presence of viral mutation(s) within the areas targeted by this assay, and inadequate number of viral copies (<250 copies / mL). A negative result must be combined with clinical observations, patient history, and epidemiological information.  Fact Sheet for  Patients:   StrictlyIdeas.no  Fact Sheet for Healthcare Providers: BankingDealers.co.za  This test is not yet approved or  cleared by the [place] FDA and has been authorized for detection and/or diagnosis of SARS-CoV-2 by FDA under an  Emergency Use Authorization (EUA).  This EUA will remain in effect (meaning this test can be used) for the duration of the COVID-19 declaration under Section 564(b)(1) of the Act, 21 U.S.C. section 360bbb-3(b)(1), unless the authorization is terminated or revoked sooner.  Performed at [facility], [place] [address], [place], [place] [address]   Surgical PCR screen     Status: None   Collection Time: 08/25/19  9:47 PM   Specimen: Nasal Mucosa; Nasal Swab  Result Value Ref Range Status   MRSA, PCR NEGATIVE NEGATIVE Final   Staphylococcus aureus NEGATIVE NEGATIVE Final    Comment: (NOTE) The Xpert SA Assay (FDA approved for NASAL specimens in patients 52 years of age and older), is one component of a comprehensive surveillance program. It is not intended to diagnose infection nor to guide or monitor treatment. Performed at [facility], [place] [address] [place], [place] [address]          Radiology Studies: No results found.      Scheduled Meds: . apixaban  5 mg Oral BID  . aspirin EC  81 mg Oral Daily  . atorvastatin  40 mg Oral Daily  . docusate sodium  100 mg Oral Daily  . insulin aspart  0-15 Units Subcutaneous TID WC  . insulin aspart  0-5 Units Subcutaneous QHS  . insulin aspart  4 Units Subcutaneous TID WC  . insulin detemir  35 Units Subcutaneous BID  . levothyroxine  112 mcg Oral Daily  . linagliptin  5 mg Oral Daily  . mupirocin ointment  1 application Nasal BID  . pantoprazole  40 mg Oral Daily  . trimethoprim  100 mg Oral Daily  . vitamin B-12  1,000 mcg Oral Daily   Continuous Infusions: . DOPamine 3 mcg/kg/min (08/27/19 1308)  . magnesium sulfate bolus  IVPB       LOS: 5 days    Time spent: 25 minutes    Edwin Dada, MD [facility] 08/27/2019, 5:02 PM     Please page though St[employer] or Epic secure chat:  For [employer], Adult nurse

## 2019-08-27 NOTE — Progress Notes (Signed)
    Subjective  - POD #1, s/p R CEA  Awake and alert   Physical Exam:  Incision c/d/i Neuro intact Tongue midline       Assessment/Plan:  POD #1  Dopamine started last night for BP in the 80's after 2L NS bolus.  Will wean to off today  Awaiting CIR eval  [place] Jaclyn [name] 08/27/2019 8:13 AM --  Vitals:   08/27/19 0600 08/27/19 0615  BP: (!) 112/44 (!) 110/52  Pulse:    Resp: 20 17  Temp:    SpO2:      Intake/Output Summary (Last 24 hours) at 08/27/2019 0813 Last data filed at 08/27/2019 0615 Gross per 24 hour  Intake 3073.82 ml  Output 350 ml  Net 2723.82 ml     Laboratory CBC    Component Value Date/Time   WBC 8.3 08/27/2019 0320   HGB 9.1 (L) 08/27/2019 0320   HCT 28.1 (L) 08/27/2019 0320   PLT 208 08/27/2019 0320    BMET    Component Value Date/Time   NA 142 08/27/2019 0320   K 3.7 08/27/2019 0320   CL 114 (H) 08/27/2019 0320   CO2 20 (L) 08/27/2019 0320   GLUCOSE 120 (H) 08/27/2019 0320   BUN 10 08/27/2019 0320   CREATININE 0.75 08/27/2019 0320   CREATININE 0.81 02/10/2019 1240   CALCIUM 8.0 (L) 08/27/2019 0320   GFRNONAA >60 08/27/2019 0320   GFRAA >60 08/27/2019 0320    COAG Lab Results  Component Value Date   INR 1.4 (H) 01/24/2019   INR 1.2 01/14/2019   INR 1.24 06/19/2017   No results found for: PTT  Antibiotics Anti-infectives (From admission, onward)   Start     Dose/Rate Route Frequency Ordered Stop   08/26/19 1600  ceFAZolin (ANCEF) IVPB 2g/100 mL premix        2 g 200 mL/hr over 30 Minutes Intravenous Every 8 hours 08/26/19 1311 08/27/19 0019   08/26/19 0700  ceFAZolin (ANCEF) IVPB 2g/100 mL premix       Note to Pharmacy: Send with pt to OR   2 g 200 mL/hr over 30 Minutes Intravenous On call 08/25/19 0815 08/26/19 0800   08/22/19 1615  trimethoprim (TRIMPEX) tablet 100 mg     Discontinue     100 mg Oral Daily 08/22/19 1614         V. Leia Alf, M.D., [facility]  [place] Office: [phone] Pager:  [phone]

## 2019-08-27 NOTE — Progress Notes (Signed)
   08/27/19 0202  Vitals  BP (!) 86/43  MAP (mmHg) (!) 57  ECG Heart Rate 62  Resp 15   Dopamine increased to 5 mic/kg

## 2019-08-27 NOTE — Anesthesia Postprocedure Evaluation (Signed)
Anesthesia Post Note  Patient: Jaclyn [name]  Procedure(s) Performed: RIGHT CAROTID ENDARTERECTOMY (Right Neck) PATCH ANGIOPLASTY OF RIGHT COMMON CAROTID ARTERY USING HEMASHIELD PLATINUM FINESSE PATCH (Right Neck)     Patient location during evaluation: PACU Anesthesia Type: General Level of consciousness: awake and alert Pain management: pain level controlled Vital Signs Assessment: post-procedure vital signs reviewed and stable Respiratory status: spontaneous breathing, nonlabored ventilation, respiratory function stable and patient connected to nasal cannula oxygen Cardiovascular status: blood pressure returned to baseline and stable Postop Assessment: no apparent nausea or vomiting Anesthetic complications: no   No complications documented.  Last Vitals:  Vitals:   08/27/19 0615 08/27/19 0903  BP: (!) 110/52 (!) 115/44  Pulse:    Resp: 17 14  Temp:    SpO2:      Last Pain:  Vitals:   08/27/19 0305  TempSrc: Oral  PainSc: 0-No pain                 Kohan Azizi S

## 2019-08-27 NOTE — Progress Notes (Signed)
Pt's BP remains in 40'N systolic, easily awakes and is oriented to baseline paged Dr Trula Slade who ordered dopamine infusion will continue to monitor

## 2019-08-27 NOTE — Progress Notes (Signed)
Physical Therapy Treatment Patient Details Name: Jaclyn [name] MRN: [id number] DOB: 1935/04/02 Today's Date: 08/27/2019    History of Present Illness Patient is a 84 y/o female who presents with AMS and LUE weakness s/p fall at home. Found to have urinary frequency and hyperglycemia. 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 7/9 Rt CEA. PMH includes COVID, CVA, dementia, HTN, hypothyroidism, recurrent UTI.    PT Comments    Pt pleasant and educated for neck restrictions s/p CEA to limit strain. Pt very HOH and able to understand with low tone and loud voice. Granddaughter Lexine Baton present throughout session and able to state that pt is a more unsteady today post sx potentially due to anaesthesia than prior session. Pt with improved LUE movement but difficult to assess given art line and board on this wrist. Pt with maintained significant balance deficits, lack of awareness and decreased proprioception who would benefit from CIR to reach supervision level for home with family.   Allowed pt to stand statically at sink with pt leaning into sink for support and with attempt to step from sink to chair pt with noted imbalance and able to recognize deficit. Pt reaching for surfaces with bil UE and required cues and direction to turn to chair as well as physical assist to prevent fall.   BP 124/59 (76)     Follow Up Recommendations  CIR;Supervision for mobility/OOB;Supervision/Assistance - 24 hour     Equipment Recommendations  Other (comment) (TBD)    Recommendations for Other Services       Precautions / Restrictions Precautions Precautions: Fall    Mobility  Bed Mobility Overal bed mobility: Needs Assistance Bed Mobility: Supine to Sit           General bed mobility comments: pt requires visual and verbal cues for sequence with reliance on rail to roll toward left and elevate  trunk from surface without physical assist  Transfers Overall transfer level: Needs assistance   Transfers: Sit to/from Stand Sit to Stand: Min assist         General transfer comment: min assist to rise from surface and for balance in standing, minguard to lower to chair  Ambulation/Gait Ambulation/Gait assistance: Mod assist Gait Distance (Feet): 110 Feet Assistive device: 1 person hand held assist Gait Pattern/deviations: Step-through pattern;Decreased stride length;Narrow base of support   Gait velocity interpretation: <1.8 ft/sec, indicate of risk for recurrent falls General Gait Details: pt with RUE support for gait with mod assist required for balance with varied posterior and anterior LOB. pt reaching out for environment with LUE at times volitional and x 2 with mild ataxic movement. [name] assisted with IV pole and discussed AD options as pt will most likely requiring training with AD to reach supervision level. pt given multiple cues to recall room number and direction and could not wayfind without assist   Stairs             Wheelchair Mobility    Modified Rankin (Stroke Patients Only) Modified Rankin (Stroke Patients Only) Pre-Morbid Rankin Score: Moderate disability Modified Rankin: Moderately severe disability     Balance Overall balance assessment: History of Falls;Needs assistance Sitting-balance support: Feet supported;Single extremity supported Sitting balance-Leahy Scale: Fair     Standing balance support: Single extremity supported Standing balance-Leahy Scale: Poor Standing balance comment: at sink pt leaning on surface to rinse mouth. with movement requires single to bil UE support  Cognition Arousal/Alertness: Awake/alert Behavior During Therapy: WFL for tasks assessed/performed Overall Cognitive Status: Impaired/Different from baseline Area of Impairment: Attention;Awareness;Problem  solving;Safety/judgement;Following commands;Memory                 Orientation Level: Time;Disoriented to Current Attention Level: Selective Memory: Decreased short-term memory Following Commands: Follows multi-step commands inconsistently Safety/Judgement: Decreased awareness of deficits;Decreased awareness of safety   Problem Solving: Slow processing;Difficulty sequencing;Requires verbal cues;Requires tactile cues General Comments: pt with awareness of impaired balance and that she would fall without assist. Pt with mild LUE ataxia and unaware of positioning of LUE at times. Pt is a talker and needs redirection to task      Exercises      General Comments        Pertinent Vitals/Pain Pain Assessment: No/denies pain    Home Living                      Prior Function            PT Goals (current goals can now be found in the care plan section) Progress towards PT goals: Progressing toward goals    Frequency    Min 4X/week      PT Plan Current plan remains appropriate    Co-evaluation              AM-PAC PT "6 Clicks" Mobility   Outcome Measure  Help needed turning from your back to your side while in a flat bed without using bedrails?: A Little Help needed moving from lying on your back to sitting on the side of a flat bed without using bedrails?: A Little Help needed moving to and from a bed to a chair (including a wheelchair)?: A Little Help needed standing up from a chair using your arms (e.g., wheelchair or bedside chair)?: A Little Help needed to walk in hospital room?: A Lot Help needed climbing 3-5 steps with a railing? : A Lot 6 Click Score: 16    End of Session Equipment Utilized During Treatment: Gait belt Activity Tolerance: Patient tolerated treatment well Patient left: with family/visitor present;in chair;with call bell/phone within reach Nurse Communication: Mobility status PT Visit Diagnosis: Hemiplegia and  hemiparesis;Other abnormalities of gait and mobility (R26.89) Hemiplegia - Right/Left: Left Hemiplegia - dominant/non-dominant: Non-dominant Hemiplegia - caused by: Cerebral infarction     Time: 1020-1106 PT Time Calculation (min) (ACUTE ONLY): 46 min  Charges:  $Gait Training: 8-22 mins $Therapeutic Activity: 23-37 mins                     Holden Draughon P, PT Acute Rehabilitation Services Pager: [phone] Office: [place] 08/27/2019, 12:56 PM

## 2019-08-27 NOTE — Progress Notes (Signed)
   08/27/19 0615  Vitals  BP (!) 110/52  MAP (mmHg) 67  ECG Heart Rate 71  Resp 17   Dopamine decreased to 3 mic/kg

## 2019-08-28 ENCOUNTER — Encounter (HOSPITAL_COMMUNITY): Payer: Self-pay | Admitting: Vascular Surgery

## 2019-08-28 LAB — BASIC METABOLIC PANEL
Anion gap: 9 (ref 5–15)
BUN: 10 mg/dL (ref 8–23)
CO2: 20 mmol/L — ABNORMAL LOW (ref 22–32)
Calcium: 8.1 mg/dL — ABNORMAL LOW (ref 8.9–10.3)
Chloride: 108 mmol/L (ref 98–111)
Creatinine, Ser: 0.78 mg/dL (ref 0.44–1.00)
GFR calc Af Amer: >60 mL/min (ref >60)
GFR calc non Af Amer: >60 mL/min (ref >60)
Glucose, Bld: 319 mg/dL — ABNORMAL HIGH (ref 70–99)
Potassium: 3.7 mmol/L (ref 3.5–5.1)
Sodium: 137 mmol/L (ref 135–145)

## 2019-08-28 LAB — CBC
HCT: 29.5 % — ABNORMAL LOW (ref 36.0–46.0)
Hemoglobin: 9.3 g/dL — ABNORMAL LOW (ref 12.0–15.0)
MCH: 29.3 pg (ref 26.0–34.0)
MCHC: 31.5 g/dL (ref 30.0–36.0)
MCV: 93.1 fL (ref 80.0–100.0)
Platelets: 191 10*3/uL (ref 150–400)
RBC: 3.17 MIL/uL — ABNORMAL LOW (ref 3.87–5.11)
RDW: 15.1 % (ref 11.5–15.5)
WBC: 6.6 10*3/uL (ref 4.0–10.5)
nRBC: 0 % (ref 0.0–0.2)

## 2019-08-28 LAB — GLUCOSE, CAPILLARY
Glucose-Capillary: 254 mg/dL — ABNORMAL HIGH (ref 70–99)
Glucose-Capillary: 252 mg/dL — ABNORMAL HIGH (ref 70–99)
Glucose-Capillary: 282 mg/dL — ABNORMAL HIGH (ref 70–99)
Glucose-Capillary: 239 mg/dL — ABNORMAL HIGH (ref 70–99)

## 2019-08-28 MED ORDER — INSULIN DETEMIR 100 UNIT/ML ~~LOC~~ SOLN
40.0000 [IU] | Freq: Two times a day (BID) | SUBCUTANEOUS | Status: DC
  Administered 2019-08-28 – 2019-08-31 (×7): 40 [IU] via SUBCUTANEOUS
  Filled 2019-08-28 (×8): qty 0.4

## 2019-08-28 MED ORDER — INSULIN ASPART 100 UNIT/ML ~~LOC~~ SOLN
6.0000 [IU] | Freq: Three times a day (TID) | SUBCUTANEOUS | Status: DC
  Administered 2019-08-28 – 2019-08-31 (×8): 6 [IU] via SUBCUTANEOUS

## 2019-08-28 NOTE — Progress Notes (Signed)
PROGRESS NOTE    Jaclyn [name]  JME:[id number] DOB: 13-Sep-1935 DOA: 08/21/2019 PCP: Jinny Sanders, MD      Brief Narrative:  Jaclyn [name] is a 84 y.o. F with hx A. fib on Eliquis, carotid stenosis, fibromyalgia, TIA, diabetes, hypothyroidism, HTN who presented with 2 days left-sided weakness as well as confusion.  In the ER, MRI showed acute ischemic stroke.           Assessment & Plan: Acute ischemic stroke Work up and treatment are complete.  Admitted and MRI showed subcentimeter right corona radiata stroke.  In terms of secondary causes of stroke evaluation: Echocardiogram unremarkable Carotid imaging showed severe right carotid stenosis.  Vascular surgery was consulted and performed CEA as below.  In terms of stroke risk reduction: LDL not at goal, continue high intensity atorvastatin Aspirin started at admission, continue aspirin 81 and Eliquis Non-smoker    Carotid artery disease, symptomatic S/p right CEA by Dr. Donnetta Hutching on 7/9 Carotid imaging performed after stroke showed high-grade stenosis.  Vascular surgery consulted, recommended unilateral CEA.  CEA completed 7/9 complicated by postoperative hypotension requiring dopamine. -Postsurgical care per [facility]   Hypotension This began intra-operatively, and continued for 24 hours.  Required dopamine infusion, without signs of infection, sepsis, Cardiogenic shock.  Ultimately improved last night with more fluids.      Delirium superimposed on MCI versus early dementia No prior history of diagnosed dementia, but likely some MCI, especially since Covid last year.  This is complicated by her hard of hearing.  Seems to have stabilized, and she is able to reorient herself, and seems oriented to person, situation. Delirium precautions:   -Lights and TV off, minimize interruptions at night  -Blinds open and lights on during day  -Glasses/hearing aid with patient  -Frequent reorientation  -PT/OT when able  -Avoid  sedation medications/Beers list medications    Permanent atrial fibrillation -Continue Eliquis  Type 2 diabetes, insulin-dependent, no complications Blood sugars much higher this morning -Continue Levemir, increase dose -Continue sliding scale correction insulin -Continue mealtime insulin, increased dose -Continue linagliptin   Hypothyroidism -Continue levothyroxine  B12 deficiency -Continue B12   History of recurrent UTI -Continue prophylactic trimethoprim  Fibromyalgia Not on treatment  Hypertension Chart history, not on meds at home   Hypomagnesemia Repleted          Disposition: Status is: Inpatient  Remains inpatient appropriate because: The patient has had carotid endarterectomy.  She is now medically stable, but is significantly weakened due to her stroke and recent surgery.  She will require extensive rehabilitation, but is a good candidate to participate in aggressive therapy several hours per day, and then return home    Dispo: The patient is from: Home              Anticipated d/c is to: CIR              Anticipated d/c date is: When CIR then available              Patient currently medically ready for discharge              MDM: The below labs and imaging reports reviewed and summarized above.  Medication management as above.       DVT prophylaxis: to restart full anticoagulation postoperatively  Code Status: Full code Family Communication:      Consultants:   Neurology  Vascular surgery  Procedures:   7/4 CT head  7/4 MRI brain subcentimeter periventricular white matter  infarct right frontal corona radiata  7/5 CT angiogram head and neck --neck severe atherosclerosis progressed since 2019 CTA, new severe stenosis distal left vertebral artery, radiographic string sign stenosis of the proximal right ICA, proximal left 70% stenosis left ICA, severe bilateral PCA P1 M2 stenoses  7/7 coronary CT -- 82 percentile calcium  score, aggressive risk factor reduction recommended  7/9 right CEA by Dr. Donnetta Hutching  Antimicrobials:      Culture data:              Subjective: No fever, dysuria, cough, dyspnea, chest discomfort, neck pain.    Objective: Vitals:   08/28/19 0800 08/28/19 0900 08/28/19 1000 08/28/19 1209  BP: (!) 129/52 117/76 (!) 113/57 112/61  Pulse:  70  70  Resp: 14 17  18   Temp:  98.5 F (36.9 C)  98.4 F (36.9 C)  TempSrc:  Oral  Oral  SpO2:      Weight:      Height:        Intake/Output Summary (Last 24 hours) at 08/28/2019 1552 Last data filed at 08/28/2019 0300 Gross per 24 hour  Intake 311.01 ml  Output --  Net 311.01 ml   Filed Weights   08/21/19 1857  Weight: 66 kg    Examination: General appearance: Elderly adult female, sitting in his bed, eating breakfast, interactive, pleasant.     HEENT: Anicteric, conjunctival pink, lids and lashes normal.  Partially edentulous, oropharynx moist, no oral lesions, hearing diminished. Skin: Neck incision clean dry and intact, bruising of the neck but no mass or fullness. Cardiac: RRR, no murmurs, no lower extremity edema Respiratory: Normal respiratory rate and rhythm, lungs clear without rales or wheezing Abdomen:   MSK:  Neuro: Extraocular movements intact, strength 5/5 in the upper and lower extremities bilaterally, no numbness to soft touch, oriented to self, situation, very hard of hearing, speech fluent Psych: Somewhat tangential and impulsive, and hard to redirect, but overall oriented to self, situation       Data Reviewed: I have personally reviewed following labs and imaging studies:  CBC: Recent Labs  Lab 08/21/19 1939 08/21/19 1939 08/22/19 1137 08/22/19 1137 08/23/19 0425 08/23/19 0425 08/24/19 0741 08/25/19 0424 08/26/19 0602 08/27/19 0320 08/28/19 1037  WBC 6.7   < > 7.2   < > 5.5   < > 5.1 6.4 5.7 8.3 6.6  NEUTROABS 4.3  --  4.6  --  3.5  --   --   --   --   --   --   HGB 11.4*   < > 12.2    < > 12.0   < > 11.6* 11.3* 11.8* 9.1* 9.3*  HCT 34.3*   < > 36.0   < > 36.2   < > 34.9* 34.6* 36.6 28.1* 29.5*  MCV 90.0   < > 88.9   < > 88.1   < > 89.7 89.4 89.9 93.0 93.1  PLT 197   < > 217   < > 204   < > 206 205 221 208 191   < > = values in this interval not displayed.   Basic Metabolic Panel: Recent Labs  Lab 08/23/19 0425 08/24/19 0741 08/25/19 0424 08/27/19 0320 08/28/19 1037  NA 142 140 140 142 137  K 3.0* 4.2 3.7 3.7 3.7  CL 108 109 108 114* 108  CO2 23 21* 24 20* 20*  GLUCOSE 165* 318* 259* 120* 319*  BUN 5* 10 <5* 10 10  CREATININE 0.61 0.85  0.72 0.75 0.78  CALCIUM 8.8* 8.8* 8.9 8.0* 8.1*  MG 1.4* 1.6* 2.0 1.4*  --   PHOS 3.8 3.2  --   --   --    GFR: Estimated Creatinine Clearance: 48.4 mL/min (by C-G formula based on SCr of 0.78 mg/dL). Liver Function Tests: Recent Labs  Lab 08/22/19 1137 08/23/19 0425 08/24/19 0741 08/25/19 0424 08/27/19 0320  AST 19 20 21 20 17   ALT 16 16 17 18 13   ALKPHOS 60 57 63 63 46  BILITOT 0.6 1.0 0.6 0.6 0.6  PROT 6.4* 6.1* 6.0* 6.3* 5.2*  ALBUMIN 3.4* 3.1* 3.0* 3.2* 2.6*   No results for input(s): LIPASE, AMYLASE in the last 168 hours. Recent Labs  Lab 08/22/19 1629  AMMONIA 26   Coagulation Profile: No results for input(s): INR, PROTIME in the last 168 hours. Cardiac Enzymes: No results for input(s): CKTOTAL, CKMB, CKMBINDEX, TROPONINI in the last 168 hours. BNP (last 3 results) No results for input(s): PROBNP in the last 8760 hours. HbA1C: No results for input(s): HGBA1C in the last 72 hours. CBG: Recent Labs  Lab 08/27/19 1209 08/27/19 1727 08/27/19 2154 08/28/19 0644 08/28/19 1211  GLUCAP 252* 259* 190* 254* 252*   Lipid Profile: No results for input(s): CHOL, HDL, LDLCALC, TRIG, CHOLHDL, LDLDIRECT in the last 72 hours. Thyroid Function Tests: No results for input(s): TSH, T4TOTAL, FREET4, T3FREE, THYROIDAB in the last 72 hours. Anemia Panel: No results for input(s): VITAMINB12, FOLATE, FERRITIN,  TIBC, IRON, RETICCTPCT in the last 72 hours. Urine analysis:    Component Value Date/Time   COLORURINE YELLOW 08/21/2019 2040   APPEARANCEUR CLEAR 08/21/2019 2040   LABSPEC 1.027 08/21/2019 2040   PHURINE 5.0 08/21/2019 2040   GLUCOSEU >=500 (A) 08/21/2019 2040   GLUCOSEU >=1000 (A) 03/16/2019 0915   HGBUR NEGATIVE 08/21/2019 2040   BILIRUBINUR NEGATIVE 08/21/2019 2040   BILIRUBINUR negative 12/18/2017 1613   KETONESUR 20 (A) 08/21/2019 2040   PROTEINUR NEGATIVE 08/21/2019 2040   UROBILINOGEN 0.2 03/16/2019 0915   NITRITE NEGATIVE 08/21/2019 2040   LEUKOCYTESUR NEGATIVE 08/21/2019 2040   Sepsis Labs: @LABRCNTIP (procalcitonin:4,lacticacidven:4)  ) Recent Results (from the past 240 hour(s))  Urine culture     Status: Abnormal   Collection Time: 08/21/19  8:44 PM   Specimen: Urine, Clean Catch  Result Value Ref Range Status   Specimen Description URINE, CLEAN CATCH  Final   Special Requests   Final    NONE Performed at [facility], [place] [address], [place], [place] [address]    Culture (A)  Final    30,000 COLONIES/mL MULTIPLE SPECIES PRESENT, SUGGEST RECOLLECTION   Report Status 08/23/2019 FINAL  Final  SARS Coronavirus 2 by RT PCR (hospital order, performed in [facility] hospital lab) Nasopharyngeal Nasopharyngeal Swab     Status: None   Collection Time: 08/22/19  1:04 AM   Specimen: Nasopharyngeal Swab  Result Value Ref Range Status   SARS Coronavirus 2 NEGATIVE NEGATIVE Final    Comment: (NOTE) SARS-CoV-2 target nucleic acids are NOT DETECTED.  The SARS-CoV-2 RNA is generally detectable in upper and lower respiratory specimens during the acute phase of infection. The lowest concentration of SARS-CoV-2 viral copies this assay can detect is 250 copies / mL. A negative result does not preclude SARS-CoV-2 infection and should not be used as the sole basis for treatment or other patient management decisions.  A negative result may occur with improper specimen  collection / handling, submission of specimen other than nasopharyngeal swab, presence  of viral mutation(s) within the areas targeted by this assay, and inadequate number of viral copies (<250 copies / mL). A negative result must be combined with clinical observations, patient history, and epidemiological information.  Fact Sheet for Patients:   StrictlyIdeas.no  Fact Sheet for Healthcare Providers: BankingDealers.co.za  This test is not yet approved or  cleared by the [place] FDA and has been authorized for detection and/or diagnosis of SARS-CoV-2 by FDA under an Emergency Use Authorization (EUA).  This EUA will remain in effect (meaning this test can be used) for the duration of the COVID-19 declaration under Section 564(b)(1) of the Act, 21 U.S.C. section 360bbb-3(b)(1), unless the authorization is terminated or revoked sooner.  Performed at [facility], [place] [address] [place], [place] [address]   Surgical PCR screen     Status: None   Collection Time: 08/25/19  9:47 PM   Specimen: Nasal Mucosa; Nasal Swab  Result Value Ref Range Status   MRSA, PCR NEGATIVE NEGATIVE Final   Staphylococcus aureus NEGATIVE NEGATIVE Final    Comment: (NOTE) The Xpert SA Assay (FDA approved for NASAL specimens in patients 93 years of age and older), is one component of a comprehensive surveillance program. It is not intended to diagnose infection nor to guide or monitor treatment. Performed at [facility], [place] [address] [place],  [address]          Radiology Studies: No results found.      Scheduled Meds: . apixaban  5 mg Oral BID  . aspirin EC  81 mg Oral Daily  . atorvastatin  40 mg Oral Daily  . docusate sodium  100 mg Oral Daily  . insulin aspart  0-15 Units Subcutaneous TID WC  . insulin aspart  0-5 Units Subcutaneous QHS  . insulin aspart  6 Units Subcutaneous TID WC  . insulin detemir  40 Units  Subcutaneous BID  . levothyroxine  112 mcg Oral Daily  . linagliptin  5 mg Oral Daily  . mupirocin ointment  1 application Nasal BID  . pantoprazole  40 mg Oral Daily  . trimethoprim  100 mg Oral Daily  . vitamin B-12  1,000 mcg Oral Daily   Continuous Infusions: . DOPamine Stopped (08/27/19 1712)  . magnesium sulfate bolus IVPB       LOS: 6 days    Time spent: 25 minutes    Edwin Dada, MD [facility] 08/28/2019, 3:52 PM     Please page though Painter or Epic secure chat:  For [employer], Adult nurse

## 2019-08-28 NOTE — Progress Notes (Addendum)
  Progress Note    08/28/2019 8:20 AM 2 Days Post-Op  Subjective:  No complaints   Vitals:   08/27/19 2301 08/28/19 0410  BP: (!) 146/99 134/75  Pulse: 82 75  Resp: 17 17  Temp: 98 F (36.7 C) 98.2 F (36.8 C)  SpO2: 99% 100%   Physical Exam: Lungs:  Non labored Incisions:  R neck incision with ecchymosis but no edema, and soft Extremities:  Moving all extremities well Neurologic: coordination deficit L arm  CBC    Component Value Date/Time   WBC 8.3 08/27/2019 0320   RBC 3.02 (L) 08/27/2019 0320   HGB 9.1 (L) 08/27/2019 0320   HCT 28.1 (L) 08/27/2019 0320   PLT 208 08/27/2019 0320   MCV 93.0 08/27/2019 0320   MCH 30.1 08/27/2019 0320   MCHC 32.4 08/27/2019 0320   RDW 15.3 08/27/2019 0320   LYMPHSABS 1.3 08/23/2019 0425   MONOABS 0.6 08/23/2019 0425   EOSABS 0.1 08/23/2019 0425   BASOSABS 0.0 08/23/2019 0425    BMET    Component Value Date/Time   NA 142 08/27/2019 0320   K 3.7 08/27/2019 0320   CL 114 (H) 08/27/2019 0320   CO2 20 (L) 08/27/2019 0320   GLUCOSE 120 (H) 08/27/2019 0320   BUN 10 08/27/2019 0320   CREATININE 0.75 08/27/2019 0320   CREATININE 0.81 02/10/2019 1240   CALCIUM 8.0 (L) 08/27/2019 0320   GFRNONAA >60 08/27/2019 0320   GFRAA >60 08/27/2019 0320    INR    Component Value Date/Time   INR 1.4 (H) 01/24/2019 1321     Intake/Output Summary (Last 24 hours) at 08/28/2019 0820 Last data filed at 08/28/2019 0300 Gross per 24 hour  Intake 551.01 ml  Output --  Net 551.01 ml     Assessment/Plan:  84 y.o. female is s/p R CEA for symptomatic carotid stenosis 2 Days Post-Op   R neck incision healing well Therapy recommending CIR Ok for d/c to CIR when bed approved   Dagoberto Ligas, PA-C [facility] [phone] 08/28/2019 8:20 AM   I agree with the above.  Patient is postoperative day 2 from a right carotid endarterectomy.  Her incision is healing nicely.  Dopamine was discontinued yesterday.  Awaiting DC  to CIR.  Annamarie Major

## 2019-08-29 ENCOUNTER — Inpatient Hospital Stay (HOSPITAL_COMMUNITY): Payer: Medicare Other

## 2019-08-29 DIAGNOSIS — I6523 Occlusion and stenosis of bilateral carotid arteries: Secondary | ICD-10-CM

## 2019-08-29 DIAGNOSIS — R471 Dysarthria and anarthria: Secondary | ICD-10-CM

## 2019-08-29 DIAGNOSIS — F0390 Unspecified dementia without behavioral disturbance: Secondary | ICD-10-CM

## 2019-08-29 DIAGNOSIS — Z9889 Other specified postprocedural states: Secondary | ICD-10-CM

## 2019-08-29 DIAGNOSIS — E7800 Pure hypercholesterolemia, unspecified: Secondary | ICD-10-CM

## 2019-08-29 DIAGNOSIS — I9589 Other hypotension: Secondary | ICD-10-CM

## 2019-08-29 LAB — COMPREHENSIVE METABOLIC PANEL
ALT: 18 U/L (ref 0–44)
AST: 21 U/L (ref 15–41)
Albumin: 2.7 g/dL — ABNORMAL LOW (ref 3.5–5.0)
Alkaline Phosphatase: 60 U/L (ref 38–126)
Anion gap: 7 (ref 5–15)
BUN: 9 mg/dL (ref 8–23)
CO2: 21 mmol/L — ABNORMAL LOW (ref 22–32)
Calcium: 8.3 mg/dL — ABNORMAL LOW (ref 8.9–10.3)
Chloride: 111 mmol/L (ref 98–111)
Creatinine, Ser: 0.8 mg/dL (ref 0.44–1.00)
GFR calc Af Amer: >60 mL/min (ref >60)
GFR calc non Af Amer: >60 mL/min (ref >60)
Glucose, Bld: 172 mg/dL — ABNORMAL HIGH (ref 70–99)
Potassium: 3.6 mmol/L (ref 3.5–5.1)
Sodium: 139 mmol/L (ref 135–145)
Total Bilirubin: 0.7 mg/dL (ref 0.3–1.2)
Total Protein: 5.4 g/dL — ABNORMAL LOW (ref 6.5–8.1)

## 2019-08-29 LAB — GLUCOSE, CAPILLARY
Glucose-Capillary: 155 mg/dL — ABNORMAL HIGH (ref 70–99)
Glucose-Capillary: 117 mg/dL — ABNORMAL HIGH (ref 70–99)
Glucose-Capillary: 229 mg/dL — ABNORMAL HIGH (ref 70–99)
Glucose-Capillary: 184 mg/dL — ABNORMAL HIGH (ref 70–99)
Glucose-Capillary: 237 mg/dL — ABNORMAL HIGH (ref 70–99)

## 2019-08-29 LAB — CBC
HCT: 27.7 % — ABNORMAL LOW (ref 36.0–46.0)
Hemoglobin: 8.8 g/dL — ABNORMAL LOW (ref 12.0–15.0)
MCH: 29.1 pg (ref 26.0–34.0)
MCHC: 31.8 g/dL (ref 30.0–36.0)
MCV: 91.7 fL (ref 80.0–100.0)
Platelets: 179 10*3/uL (ref 150–400)
RBC: 3.02 MIL/uL — ABNORMAL LOW (ref 3.87–5.11)
RDW: 15.1 % (ref 11.5–15.5)
WBC: 7 10*3/uL (ref 4.0–10.5)
nRBC: 0 % (ref 0.0–0.2)

## 2019-08-29 MED ORDER — DULOXETINE HCL 60 MG PO CPEP
60.0000 mg | ORAL_CAPSULE | Freq: Every day | ORAL | Status: DC
  Administered 2019-08-29 – 2019-08-31 (×3): 60 mg via ORAL
  Filled 2019-08-29 (×3): qty 1

## 2019-08-29 MED ORDER — SODIUM CHLORIDE 0.9 % IV SOLN: INTRAVENOUS | Status: AC

## 2019-08-29 MED ORDER — MIDODRINE HCL 5 MG PO TABS
2.5000 mg | ORAL_TABLET | Freq: Three times a day (TID) | ORAL | Status: DC
  Administered 2019-08-29 – 2019-08-30 (×2): 2.5 mg via ORAL
  Filled 2019-08-29 (×2): qty 1

## 2019-08-29 MED ORDER — IOHEXOL 350 MG/ML SOLN
75.0000 mL | Freq: Once | INTRAVENOUS | Status: AC | PRN
  Administered 2019-08-29: 75 mL via INTRAVENOUS

## 2019-08-29 NOTE — Progress Notes (Addendum)
OT Cancellation Note  Patient Details Name: Jaclyn [name] MRN: [id number] DOB: 05-Oct-1935   Cancelled Treatment:    Reason Eval/Treat Not Completed: Medical issues which prohibited therapy (New bedrest orders). Patient with neurolocial event overnight with noted slurred speech, expressive aphasia and confusion. MRI (+) acute/early subacute cortical infarcts within the right pre and postcentral gyri. OT will continue efforts toward POC when patient is able to participate.   [name] 08/29/2019, 11:19 AM

## 2019-08-29 NOTE — Progress Notes (Addendum)
Was called by charge R.N. Octavia Bruckner. Son concern patient might be having a stroke Patient was aasess by [employer]. and noted to have some Expressive aphasia and some slurred speech. I went inmmed. to patient room at this time she answer all question approp. No expressive aphasia and and very mild speech slurred. All other neuro. Intact. Called [facility] R.N. to come and assess patient. Patient now at 01:50 has expressive aphasia and and some slurred speech. Dr. Jeronimo Greaves page and return call and will come see patient.

## 2019-08-29 NOTE — Progress Notes (Signed)
Called to patients room by son saying pt was having difficulty speaking went to assess pt NIH of 2 with mild slurred speech, went to notify primary nurse see further notes by staff

## 2019-08-29 NOTE — Progress Notes (Addendum)
Inpatient Rehabilitation Admissions Coordinator  Noted patient with active bedrest orders and therapy unable to give updated assessments today. I am unable to proceed with insurance authorization for a possible CIR admit at this time. Please advise.  Danne Baxter, RN, MSN Rehab Admissions Coordinator [phone] 08/29/2019 12:39 PM   I spoke with pt's son at bedside and granddaughter, NIcki by phone. They prefer CIR admit if insurance approves. I will begin insurance approval with [facility] Medicare.  Danne Baxter, RN, MSN Rehab Admissions Coordinator [phone] 08/29/2019 5:17 PM

## 2019-08-29 NOTE — Progress Notes (Signed)
PROGRESS NOTE    Jaclyn [name]  DTO:[id number] DOB: 1935/07/28 DOA: 08/21/2019 PCP: Jinny Sanders, MD      Brief Narrative:  Jaclyn [name] is a 84 y.o. F with hx A. fib on Eliquis, carotid stenosis, fibromyalgia, TIA, diabetes, hypothyroidism, HTN who presented with 2 days left-sided weakness as well as confusion.  In the ER, MRI showed acute ischemic stroke.    Admitted and completed secondary stroke work up and risk reduction.   Noted to have high grade R carotid stenosis CEA completed 7/9 complicated by postoperative hypotension requiring dopamine for 24 hours. Waiting for CIR placement and had aphasic episode on 7/11 overnight suspected to be from low BP. Now pending CIR placement.        Assessment & Plan:   Acute ischemic stroke Admitted and MRI showed subcentimeter right corona radiata stroke.    In terms of evaluation for secondary causes of stroke: Echocardiogram unremarkable Carotid imaging showed severe right carotid stenosis.  Vascular surgery was consulted and performed CEA as below.  In terms of stroke risk reduction: LDL not at goal, continue atorvastatin 40 mg Aspirin started at admission, continue aspirin 81 and Eliquis Non-smoker    Symptomatic carotid artery disease S/p right CEA by Dr. Donnetta Hutching on 7/9 Carotid imaging performed after stroke showed high-grade stenosis.  Vascular surgery consulted, recommended unilateral CEA which was completed 7/9.  Post-op course complicated by some hypotension requiring brief dopamine.   Overnight episode of aphasia, confusion on 7/11 Attending called to the bedside overnight last night due to acute onset of what appeared to be confusion and expressive aphasia which seemed to persist paroxysmally overnight and then resolved by morning.  CTA overnight showed expected findings  Only.  MRI this morning showed a few new punctate infarcts in the RIGHT carotid watershed.  Interpreting this event is obviously  complicated by the patient's hearing loss and cognitive deficits/dementia since her COVID infection.  However, it is felt that these episodes may have represented some transient underperfusion due to hypotension. Neurology recommend midodrine to maintain SBP 130-150 range. -Start low dose midodrine    Delirium superimposed on MCI versus early dementia No prior history of diagnosed dementia, but likely some MCI, especially since Covid last year.  This is complicated by her hard of hearing.  Seems to have stabilized, and she is able to reorient herself, and seems oriented to person, situation. Delirium precautions:   -Lights and TV off, minimize interruptions at night  -Blinds open and lights on during day  -Glasses/hearing aid with patient  -Frequent reorientation  -PT/OT when able  -Avoid sedation medications/Beers list medications    Permanent atrial fibrillation -Continue Eliquis  Type 2 diabetes, insulin-dependent, no complications Blood sugars better this morning after adjustments yesterday  -Continue Levemir -Continue sliding scale correction insulin -Continue mealtime insulin, increased dose -Continue linagliptin   Hypothyroidism -Continue levothyroxine  B12 deficiency -Continue B12   History of recurrent UTI -Continue prophylactic trimethoprim  Fibromyalgia Not on treatment  Hypertension Chart history, not on meds at home   Hypomagnesemia Repleted          Disposition: Status is: Inpatient  Remains inpatient appropriate because: The patient has had carotid endarterectomy.  She has medically stabilized.  Her event overnight does not appear clinically significant, and neurology not recommend that she start oral midodrine and that she is stable for discharge.  We will continue to await CIR placement.      Dispo: The patient is from: Home  Anticipated d/c is to: CIR              Anticipated d/c date is: When CIR then available               Patient currently medically ready for discharge              MDM: The below labs and imaging reports reviewed and summarized above.  Medication management as above.       DVT prophylaxis: to restart full anticoagulation postoperatively  Code Status: Full code Family Communication:      Consultants:   Neurology  Vascular surgery  Procedures:   7/4 CT head  7/4 MRI brain subcentimeter periventricular white matter infarct right frontal corona radiata  7/5 CT angiogram head and neck --neck severe atherosclerosis progressed since 2019 CTA, new severe stenosis distal left vertebral artery, radiographic string sign stenosis of the proximal right ICA, proximal left 70% stenosis left ICA, severe bilateral PCA P1 M2 stenoses  7/7 coronary CT -- 82 percentile calcium score, aggressive risk factor reduction recommended  7/9 right CEA by Dr. Donnetta Hutching  Antimicrobials:      Culture data:              Subjective: Episode of reported aphasia overnight.  No fever, dysuria, cough, dyspnea, discomfort, neck pain, neck swelling.    Objective: Vitals:   08/29/19 1145 08/29/19 1148 08/29/19 1152 08/29/19 1710  BP:  123/60 (!) 120/58 (!) 126/111  Pulse:  74 73 74  Resp:  (!) 21 (!) 22 19  Temp:   98.9 F (37.2 C) 98.9 F (37.2 C)  TempSrc:   Oral Oral  SpO2: 95% 95% 97% 99%  Weight:      Height:        Intake/Output Summary (Last 24 hours) at 08/29/2019 1716 Last data filed at 08/29/2019 6387 Gross per 24 hour  Intake 240 ml  Output --  Net 240 ml   Filed Weights   08/21/19 1857  Weight: 66 kg    Examination: General appearance: Elderly adult female, sitting in bed, eating.  Interactive, pleasant.  Extremely extremely hard of hearing.     HEENT: Partially edentulous, no oral lesions, hearing diminished.  Anicteric, conjunctival pink, lids and lashes normal. Skin:  Cardiac: RRR, no murmurs, no lower extremity edema Respiratory: Normal respiratory  rate and rhythm, lungs clear without rales wheezes Abdomen:   MSK:  Neuro: Left arm ataxia, no change to fluency, tone of voice.  No focal weakness, numbness. Psych: Attention normal, but tangential, rambling, impulsive.         Data Reviewed: I have personally reviewed following labs and imaging studies:  CBC: Recent Labs  Lab 08/23/19 0425 08/24/19 0741 08/25/19 0424 08/26/19 0602 08/27/19 0320 08/28/19 1037 08/29/19 0205  WBC 5.5   < > 6.4 5.7 8.3 6.6 7.0  NEUTROABS 3.5  --   --   --   --   --   --   HGB 12.0   < > 11.3* 11.8* 9.1* 9.3* 8.8*  HCT 36.2   < > 34.6* 36.6 28.1* 29.5* 27.7*  MCV 88.1   < > 89.4 89.9 93.0 93.1 91.7  PLT 204   < > 205 221 208 191 179   < > = values in this interval not displayed.   Basic Metabolic Panel: Recent Labs  Lab 08/23/19 0425 08/23/19 0425 08/24/19 0741 08/25/19 0424 08/27/19 0320 08/28/19 1037 08/29/19 0205  NA 142   < > 140  140 142 137 139  K 3.0*   < > 4.2 3.7 3.7 3.7 3.6  CL 108   < > 109 108 114* 108 111  CO2 23   < > 21* 24 20* 20* 21*  GLUCOSE 165*   < > 318* 259* 120* 319* 172*  BUN 5*   < > 10 <5* 10 10 9   CREATININE 0.61   < > 0.85 0.72 0.75 0.78 0.80  CALCIUM 8.8*   < > 8.8* 8.9 8.0* 8.1* 8.3*  MG 1.4*  --  1.6* 2.0 1.4*  --   --   PHOS 3.8  --  3.2  --   --   --   --    < > = values in this interval not displayed.   GFR: Estimated Creatinine Clearance: 48.4 mL/min (by C-G formula based on SCr of 0.8 mg/dL). Liver Function Tests: Recent Labs  Lab 08/23/19 0425 08/24/19 0741 08/25/19 0424 08/27/19 0320 08/29/19 0205  AST 20 21 20 17 21   ALT 16 17 18 13 18   ALKPHOS 57 63 63 46 60  BILITOT 1.0 0.6 0.6 0.6 0.7  PROT 6.1* 6.0* 6.3* 5.2* 5.4*  ALBUMIN 3.1* 3.0* 3.2* 2.6* 2.7*   No results for input(s): LIPASE, AMYLASE in the last 168 hours. No results for input(s): AMMONIA in the last 168 hours. Coagulation Profile: No results for input(s): INR, PROTIME in the last 168 hours. Cardiac Enzymes: No  results for input(s): CKTOTAL, CKMB, CKMBINDEX, TROPONINI in the last 168 hours. BNP (last 3 results) No results for input(s): PROBNP in the last 8760 hours. HbA1C: No results for input(s): HGBA1C in the last 72 hours. CBG: Recent Labs  Lab 08/28/19 2130 08/29/19 0151 08/29/19 0608 08/29/19 1150 08/29/19 1711  GLUCAP 239* 155* 117* 229* 184*   Lipid Profile: No results for input(s): CHOL, HDL, LDLCALC, TRIG, CHOLHDL, LDLDIRECT in the last 72 hours. Thyroid Function Tests: No results for input(s): TSH, T4TOTAL, FREET4, T3FREE, THYROIDAB in the last 72 hours. Anemia Panel: No results for input(s): VITAMINB12, FOLATE, FERRITIN, TIBC, IRON, RETICCTPCT in the last 72 hours. Urine analysis:    Component Value Date/Time   COLORURINE YELLOW 08/21/2019 2040   APPEARANCEUR CLEAR 08/21/2019 2040   LABSPEC 1.027 08/21/2019 2040   PHURINE 5.0 08/21/2019 2040   GLUCOSEU >=500 (A) 08/21/2019 2040   GLUCOSEU >=1000 (A) 03/16/2019 0915   HGBUR NEGATIVE 08/21/2019 2040   BILIRUBINUR NEGATIVE 08/21/2019 2040   BILIRUBINUR negative 12/18/2017 1613   KETONESUR 20 (A) 08/21/2019 2040   PROTEINUR NEGATIVE 08/21/2019 2040   UROBILINOGEN 0.2 03/16/2019 0915   NITRITE NEGATIVE 08/21/2019 2040   LEUKOCYTESUR NEGATIVE 08/21/2019 2040   Sepsis Labs: @LABRCNTIP (procalcitonin:4,lacticacidven:4)  ) Recent Results (from the past 240 hour(s))  Urine culture     Status: Abnormal   Collection Time: 08/21/19  8:44 PM   Specimen: Urine, Clean Catch  Result Value Ref Range Status   Specimen Description URINE, CLEAN CATCH  Final   Special Requests   Final    NONE Performed at [facility], [place] [address] [place], [place] [address]    Culture (A)  Final    30,000 COLONIES/mL MULTIPLE SPECIES PRESENT, SUGGEST RECOLLECTION   Report Status 08/23/2019 FINAL  Final  SARS Coronavirus 2 by RT PCR (hospital order, performed in [facility] hospital lab) Nasopharyngeal Nasopharyngeal Swab     Status:  None   Collection Time: 08/22/19  1:04 AM   Specimen: Nasopharyngeal Swab  Result Value Ref Range  Status   SARS Coronavirus 2 NEGATIVE NEGATIVE Final    Comment: (NOTE) SARS-CoV-2 target nucleic acids are NOT DETECTED.  The SARS-CoV-2 RNA is generally detectable in upper and lower respiratory specimens during the acute phase of infection. The lowest concentration of SARS-CoV-2 viral copies this assay can detect is 250 copies / mL. A negative result does not preclude SARS-CoV-2 infection and should not be used as the sole basis for treatment or other patient management decisions.  A negative result may occur with improper specimen collection / handling, submission of specimen other than nasopharyngeal swab, presence of viral mutation(s) within the areas targeted by this assay, and inadequate number of viral copies (<250 copies / mL). A negative result must be combined with clinical observations, patient history, and epidemiological information.  Fact Sheet for Patients:   StrictlyIdeas.no  Fact Sheet for Healthcare Providers: BankingDealers.co.za  This test is not yet approved or  cleared by the [place] FDA and has been authorized for detection and/or diagnosis of SARS-CoV-2 by FDA under an Emergency Use Authorization (EUA).  This EUA will remain in effect (meaning this test can be used) for the duration of the COVID-19 declaration under Section 564(b)(1) of the Act, 21 U.S.C. section 360bbb-3(b)(1), unless the authorization is terminated or revoked sooner.  Performed at [facility], [place] [address], [place], [place] [address]   Surgical PCR screen     Status: None   Collection Time: 08/25/19  9:47 PM   Specimen: Nasal Mucosa; Nasal Swab  Result Value Ref Range Status   MRSA, PCR NEGATIVE NEGATIVE Final   Staphylococcus aureus NEGATIVE NEGATIVE Final    Comment: (NOTE) The Xpert SA Assay (FDA approved for NASAL  specimens in patients 30 years of age and older), is one component of a comprehensive surveillance program. It is not intended to diagnose infection nor to guide or monitor treatment. Performed at [facility], [place] [address] [name], [place] [address]          Radiology Studies: CT ANGIO HEAD W OR WO CONTRAST  Result Date: 08/29/2019 CLINICAL DATA:  84 year old female with neurologic deficit. Small right hemisphere white matter lacunar infarct on MRI 08/21/2019, and also recent CTA head and neck with extensive atherosclerosis which have progressed since 2019. EXAM: CT ANGIOGRAPHY HEAD AND NECK TECHNIQUE: Multidetector CT imaging of the head and neck was performed using the standard protocol during bolus administration of intravenous contrast. Multiplanar CT image reconstructions and MIPs were obtained to evaluate the vascular anatomy. Carotid stenosis measurements (when applicable) are obtained utilizing NASCET criteria, using the distal internal carotid diameter as the denominator. CONTRAST:  4mL OMNIPAQUE IOHEXOL 350 MG/ML SOLN COMPARISON:  CTA head and neck 08/22/2019. FINDINGS: CT HEAD Brain: Calcified atherosclerosis at the skull base. Stable gray-white matter differentiation throughout the brain. Patchy and confluent bilateral white matter hypodensity has not significantly changed. No midline shift, ventriculomegaly, mass effect, evidence of mass lesion, intracranial hemorrhage or evidence of cortically based acute infarction. Calvarium and skull base: No acute osseous abnormality identified. Paranasal sinuses: Visualized paranasal sinuses and mastoids are stable and well pneumatized. Orbits: No acute orbit or scalp soft tissue finding. CTA NECK Skeleton: Stable.  No acute osseous abnormality identified. Upper chest: New small to moderate bilateral layering pleural effusions. Stable apical lung scarring. No superior mediastinal lymphadenopathy. Visible central pulmonary arteries appear  patent. Other neck: New postoperative changes to the right lower neck, see right carotid findings below. Trace postoperative gas in the right neck.  No rim enhancing or drainable fluid collection. Aortic arch: Stable 3 vessel arch configuration an arch atherosclerosis. Right carotid system: Stable brachiocephalic artery plaque without stenosis. Mild right CCA plaque without stenosis proximal to the bifurcation. New right carotid endarterectomy since 08/22/2019, with widely patent right carotid bifurcation and cervical right ICA now to the skull base. Left carotid system: Stable since 08/22/2019. Complex plaque at the proximal left ICA with an estimated 70 % stenosis with respect to the distal vessel (series 9, image 105). Stable tortuosity of the left ICA just distal to the bulb. Vertebral arteries: Stable on the right with no hemodynamically significant stenosis, dominant right vertebral artery. But more apparent on these images today is high-grade stenosis of the proximal left subclavian artery due to bulky calcified plaque approaching a radiographic string sign on series 9, image 154. Stable calcified plaque at the non dominant left vertebral artery origin with only mild stenosis. Stable non dominant left vertebral artery to the skull base. CTA HEAD Posterior circulation: Dominant right vertebral artery supplies the basilar as before with intermittent mild plaque and no significant stenosis. Patent right PICA origin. Tandem severe stenoses of the distal left V4 segment are unchanged. Stable irregular basilar artery without high-grade stenosis. Stable PCA and SCA origins with severe bilateral P1/P2 stenoses. Bilateral PCA branches appears stable. Anterior circulation: Both ICA siphons remain patent and heavily calcified. Moderate to severe bilateral siphon stenosis appears stable, including in the supraclinoid segment on series 13, image 98. Stable carotid termini, MCA and ACA origins. ACAs remain normal aside from  mild irregularity. Mild to moderate bilateral MCA M1 stenoses are stable. Additional bilateral MCA branch irregularity again noted and stable from the recent CTA. Venous sinuses: Early contrast timing, not well evaluated today. Anatomic variants: Unchanged. Review of the MIP images confirms the above findings IMPRESSION: 1. Right Carotid Endarterectomy since the recent CTA on 08/22/2019 with no adverse features. 2. Also, high-grade stenosis of the proximal Left Subclavian Artery is more apparent today (series 9, image 154), approaching a Radiographic-String-Sign. The non dominant left vertebral artery is stable. 3. Otherwise stable extensive atherosclerosis and stenosis in the head and neck as described on 08/22/2019, most notable for - Severe stenosis of the Left vertebral Artery distal V4 segment. - Left ICA 70% stenosis in the neck. - Moderate to severe Bilateral ICA siphon stenosis. - Severe bilateral PCA P1/P2 stenoses. - Moderate Right MCA distal M1 stenosis. 4. Continued stable non contrast CT appearance of the brain. No new intracranial abnormality. 5. New small to moderate bilateral layering pleural effusions. Electronically Signed   By: Genevie Ann M.D.   On: 08/29/2019 03:32   CT ANGIO NECK W OR WO CONTRAST  Result Date: 08/29/2019 CLINICAL DATA:  83 year old female with neurologic deficit. Small right hemisphere white matter lacunar infarct on MRI 08/21/2019, and also recent CTA head and neck with extensive atherosclerosis which have progressed since 2019. EXAM: CT ANGIOGRAPHY HEAD AND NECK TECHNIQUE: Multidetector CT imaging of the head and neck was performed using the standard protocol during bolus administration of intravenous contrast. Multiplanar CT image reconstructions and MIPs were obtained to evaluate the vascular anatomy. Carotid stenosis measurements (when applicable) are obtained utilizing NASCET criteria, using the distal internal carotid diameter as the denominator. CONTRAST:  61mL  OMNIPAQUE IOHEXOL 350 MG/ML SOLN COMPARISON:  CTA head and neck 08/22/2019. FINDINGS: CT HEAD Brain: Calcified atherosclerosis at the skull base. Stable gray-white matter differentiation throughout the brain. Patchy and confluent bilateral white matter hypodensity has not significantly  changed. No midline shift, ventriculomegaly, mass effect, evidence of mass lesion, intracranial hemorrhage or evidence of cortically based acute infarction. Calvarium and skull base: No acute osseous abnormality identified. Paranasal sinuses: Visualized paranasal sinuses and mastoids are stable and well pneumatized. Orbits: No acute orbit or scalp soft tissue finding. CTA NECK Skeleton: Stable.  No acute osseous abnormality identified. Upper chest: New small to moderate bilateral layering pleural effusions. Stable apical lung scarring. No superior mediastinal lymphadenopathy. Visible central pulmonary arteries appear patent. Other neck: New postoperative changes to the right lower neck, see right carotid findings below. Trace postoperative gas in the right neck. No rim enhancing or drainable fluid collection. Aortic arch: Stable 3 vessel arch configuration an arch atherosclerosis. Right carotid system: Stable brachiocephalic artery plaque without stenosis. Mild right CCA plaque without stenosis proximal to the bifurcation. New right carotid endarterectomy since 08/22/2019, with widely patent right carotid bifurcation and cervical right ICA now to the skull base. Left carotid system: Stable since 08/22/2019. Complex plaque at the proximal left ICA with an estimated 70 % stenosis with respect to the distal vessel (series 9, image 105). Stable tortuosity of the left ICA just distal to the bulb. Vertebral arteries: Stable on the right with no hemodynamically significant stenosis, dominant right vertebral artery. But more apparent on these images today is high-grade stenosis of the proximal left subclavian artery due to bulky calcified  plaque approaching a radiographic string sign on series 9, image 154. Stable calcified plaque at the non dominant left vertebral artery origin with only mild stenosis. Stable non dominant left vertebral artery to the skull base. CTA HEAD Posterior circulation: Dominant right vertebral artery supplies the basilar as before with intermittent mild plaque and no significant stenosis. Patent right PICA origin. Tandem severe stenoses of the distal left V4 segment are unchanged. Stable irregular basilar artery without high-grade stenosis. Stable PCA and SCA origins with severe bilateral P1/P2 stenoses. Bilateral PCA branches appears stable. Anterior circulation: Both ICA siphons remain patent and heavily calcified. Moderate to severe bilateral siphon stenosis appears stable, including in the supraclinoid segment on series 13, image 98. Stable carotid termini, MCA and ACA origins. ACAs remain normal aside from mild irregularity. Mild to moderate bilateral MCA M1 stenoses are stable. Additional bilateral MCA branch irregularity again noted and stable from the recent CTA. Venous sinuses: Early contrast timing, not well evaluated today. Anatomic variants: Unchanged. Review of the MIP images confirms the above findings IMPRESSION: 1. Right Carotid Endarterectomy since the recent CTA on 08/22/2019 with no adverse features. 2. Also, high-grade stenosis of the proximal Left Subclavian Artery is more apparent today (series 9, image 154), approaching a Radiographic-String-Sign. The non dominant left vertebral artery is stable. 3. Otherwise stable extensive atherosclerosis and stenosis in the head and neck as described on 08/22/2019, most notable for - Severe stenosis of the Left vertebral Artery distal V4 segment. - Left ICA 70% stenosis in the neck. - Moderate to severe Bilateral ICA siphon stenosis. - Severe bilateral PCA P1/P2 stenoses. - Moderate Right MCA distal M1 stenosis. 4. Continued stable non contrast CT appearance of the  brain. No new intracranial abnormality. 5. New small to moderate bilateral layering pleural effusions. Electronically Signed   By: Genevie Ann M.D.   On: 08/29/2019 03:32   MR BRAIN WO CONTRAST  Result Date: 08/29/2019 CLINICAL DATA:  Stroke, follow-up. EXAM: MRI HEAD WITHOUT CONTRAST TECHNIQUE: Multiplanar, multiecho pulse sequences of the brain and surrounding structures were obtained without intravenous contrast. COMPARISON:  CT angiogram head/neck  08/29/2019, CT angiogram head/neck 08/22/2019, brain MRI 08/21/2019 FINDINGS: Brain: At the provider's request, a limited protocol MRI was performed. Only axial and coronal diffusion-weighted sequences, as well as an axial T2/FLAIR sequence, were obtained. Redemonstrated is a 6 mm, now subacute, infarct within the posterior right frontal lobe periventricular white matter (series 2, image 35). However new as compared to the MRI of 08/21/2019, there are small acute/early subacute cortical infarcts within the right pre and postcentral gyri measuring up to 10 mm (series 2, image 42) (series 2, image 41). Also new from this prior examination, there is a punctate acute/early subacute infarct within the Peri ventricle right parietooccipital lobes (series 2, image 24). Stable background moderate patchy T2/FLAIR hyperintensity within the cerebral white matter and pons which is nonspecific, but consistent with chronic small vessel ischemic disease. Redemonstrated chronic lacunar infarct within the anterior right external capsule. Stable, mild generalized parenchymal atrophy. Vascular: Flow voids poorly assessed on the acquired sequences. Skull and upper cervical spine: No focal marrow lesion is identified on the acquired sequences. Sinuses/Orbits: No acute orbital abnormality identified on the acquired sequences. Mild ethmoid sinus mucosal thickening. IMPRESSION: Limited protocol brain MRI as described. Small (measuring up to 10 mm) acute/early subacute cortical infarcts within  the right pre and postcentral gyri, new as compared to prior MRI 08/21/2019. Also new from this prior exam, there is a punctate acute/early subacute infarct within the periventricular right frontoparietal lobes. Redemonstrated 6 mm, now subacute, infarct within the posterior right frontal lobe periventricular white matter. Stable background mild generalized parenchymal atrophy and moderate chronic small vessel ischemic disease. Mild ethmoid sinus mucosal thickening. Electronically Signed   By: Kellie Simmering DO   On: 08/29/2019 09:14        Scheduled Meds: . apixaban  5 mg Oral BID  . aspirin EC  81 mg Oral Daily  . atorvastatin  40 mg Oral Daily  . docusate sodium  100 mg Oral Daily  . DULoxetine  60 mg Oral Daily  . insulin aspart  0-15 Units Subcutaneous TID WC  . insulin aspart  0-5 Units Subcutaneous QHS  . insulin aspart  6 Units Subcutaneous TID WC  . insulin detemir  40 Units Subcutaneous BID  . levothyroxine  112 mcg Oral Daily  . linagliptin  5 mg Oral Daily  . midodrine  2.5 mg Oral TID WC  . mupirocin ointment  1 application Nasal BID  . pantoprazole  40 mg Oral Daily  . trimethoprim  100 mg Oral Daily  . vitamin B-12  1,000 mcg Oral Daily   Continuous Infusions: . sodium chloride    . magnesium sulfate bolus IVPB       LOS: 7 days    Time spent: 25 minutes    Edwin Dada, MD [facility] 08/29/2019, 5:16 PM     Please page though [place] or Epic secure chat:  For [employer], Adult nurse

## 2019-08-29 NOTE — Progress Notes (Addendum)
Progress Note    08/29/2019 8:48 AM 3 Days Post-Op  Subjective: no complaints this morning. Says she feels fine. She says she is not sure what happened last night   Vitals:   08/29/19 0310 08/29/19 0539  BP: 117/72 (!) 119/56  Pulse: 79 71  Resp: 15 20  Temp: 98.4 F (36.9 C) 99.7 F (37.6 C)  SpO2: 95% 96%   Physical Exam: Cardiac: regular Lungs:  Non labored Incisions:  Right neck incision clean, dry and intact. Ecchymosis present. Mild swelling. Soft Extremities: moving all extremities without deficit Abdomen: soft, non tender Neurologic:  Alert and oriented, CN intact  CBC    Component Value Date/Time   WBC 7.0 08/29/2019 0205   RBC 3.02 (L) 08/29/2019 0205   HGB 8.8 (L) 08/29/2019 0205   HCT 27.7 (L) 08/29/2019 0205   PLT 179 08/29/2019 0205   MCV 91.7 08/29/2019 0205   MCH 29.1 08/29/2019 0205   MCHC 31.8 08/29/2019 0205   RDW 15.1 08/29/2019 0205   LYMPHSABS 1.3 08/23/2019 0425   MONOABS 0.6 08/23/2019 0425   EOSABS 0.1 08/23/2019 0425   BASOSABS 0.0 08/23/2019 0425    BMET    Component Value Date/Time   NA 139 08/29/2019 0205   K 3.6 08/29/2019 0205   CL 111 08/29/2019 0205   CO2 21 (L) 08/29/2019 0205   GLUCOSE 172 (H) 08/29/2019 0205   BUN 9 08/29/2019 0205   CREATININE 0.80 08/29/2019 0205   CREATININE 0.81 02/10/2019 1240   CALCIUM 8.3 (L) 08/29/2019 0205   GFRNONAA >60 08/29/2019 0205   GFRAA >60 08/29/2019 0205    INR    Component Value Date/Time   INR 1.4 (H) 01/24/2019 1321     Intake/Output Summary (Last 24 hours) at 08/29/2019 0848 Last data filed at 08/29/2019 2694 Gross per 24 hour  Intake 240 ml  Output --  Net 240 ml     Assessment/Plan:  84 y.o. female is s/p Right Carotid Endarterectomy 3 Days Post-Op. Appears neurologically intact this morning.  Right neck incision intact with some ecchymosis and mild swelling. She had a new neurolocially event last evening where she developed slurred speech and expressive aphasia  and confusion. Stat CT head was obtained to rule out hemorrhage and CTA neck was obtained of bilateral carotid stenosis status post right CEA-CT head was unremarkable for hemorrhage.  CTA redemonstrated 70% stenosis of the left carotid and patent right carotid with postsurgical changes from CEA. MRI brain has been ordered for today. She remains on Eliquis. Presently on bed rest but will resume PT/OT. Still awaiting approval for CIR  DVT prophylaxis: Eliquis   Karoline Caldwell, PA-C [facility] [phone] 08/29/2019 8:48 AM   I have examined the patient, reviewed and agree with above.  I have checked earlier around noon and both the patient and son were sleeping.  Returns now.  Events of last night noted.  The patient is sleeping but easily arousable.  She is answering questions appropriately and does not appear to have any sleep disturbance.  Her son feels that she is at her post stroke baseline.  Evaluation last night included CT angiogram showing no evidence of technical issue regarding her right carotid endarterectomy.  She does have known moderate to severe left carotid stenosis.  She does have a significant calcification but this is predicted around 70%.  Her preoperative carotid duplex suggested more in the range of 40 to 59% left carotid stenosis.  There was some question of  worsening aphasia last night but this is difficult due to her confusion and baseline state.  MRI showed no evidence of left brain event.  Very difficult management situation.  I do not see any evidence of symptoms from her left carotid.  I do not feel it is her best interest to proceed with left endarterectomy as she is recovering from her right brain stroke and right carotid endarterectomy.  Thus discussed this at length with the son who is present.  We will follow with you.  Curt Jews, MD 08/29/2019 3:59 PM

## 2019-08-29 NOTE — Progress Notes (Signed)
Physical Therapy Treatment Patient Details Name: Jaclyn [name] MRN: [id number] DOB: 11/07/1935 Today's Date: 08/29/2019    History of Present Illness Patient is a 84 y/o female who presents with AMS and LUE weakness s/p fall at home. Found to have urinary frequency and hyperglycemia. 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 7/9 Rt CEA. On 7/12 pt developed aphasia. Repeat MRI negative. Likely TIA. PMH includes COVID, CVA, dementia, HTN, hypothyroidism, recurrent UTI.    PT Comments    Pt making steady progress. Had some slurred speech and aphasia in early morning hours which resolved. Thought to be TIA. Pt still requiring assist with all mobility. Continue to recommend CIR for further therapy. Expect pt will need supervision at home even after CIR stay.   Follow Up Recommendations  CIR     Equipment Recommendations  Other (comment) (To be determined)    Recommendations for Other Services       Precautions / Restrictions Precautions Precautions: Fall    Mobility  Bed Mobility Overal bed mobility: Needs Assistance Bed Mobility: Supine to Sit     Supine to sit: Min assist;HOB elevated     General bed mobility comments: Assist to elevate trunk into sitting  Transfers Overall transfer level: Needs assistance Equipment used: 1 person hand held assist Transfers: Sit to/from [facility] Sit to Stand: Min assist Stand pivot transfers: Min assist       General transfer comment: Assist to bring hips up and for balance. Pt with pivotal steps bed to bsc to bed.   Ambulation/Gait Ambulation/Gait assistance: Mod assist;+2 safety/equipment Gait Distance (Feet): 150 Feet Assistive device: Rolling walker (2 wheeled);2 person hand held assist Gait Pattern/deviations: Step-through pattern;Decreased stride length;Drifts right/left;Narrow base of support Gait  velocity: decr Gait velocity interpretation: <1.8 ft/sec, indicate of risk for recurrent falls General Gait Details: Initially amb with hand held assist but needed bilateral UE support so began using rolling walker. Pt required assist with steering/management of walker. Running into objects. Verbal cues to stay closer to the walker   Stairs             Wheelchair Mobility    Modified Rankin (Stroke Patients Only)       Balance Overall balance assessment: Needs assistance;History of Falls Sitting-balance support: No upper extremity supported;Feet supported Sitting balance-Leahy Scale: Fair     Standing balance support: Single extremity supported;Bilateral upper extremity supported;During functional activity Standing balance-Leahy Scale: Poor Standing balance comment: UE support and min assist for static standing                            Cognition Arousal/Alertness: Awake/alert Behavior During Therapy: Impulsive;WFL for tasks assessed/performed Overall Cognitive Status: Impaired/Different from baseline Area of Impairment: Attention;Awareness;Problem solving;Safety/judgement;Following commands;Memory                   Current Attention Level: Sustained Memory: Decreased short-term memory Following Commands: Follows one step commands consistently Safety/Judgement: Decreased awareness of deficits;Decreased awareness of safety   Problem Solving: Slow processing;Difficulty sequencing;Requires verbal cues;Requires tactile cues General Comments: Pt very distracted throughout.       Exercises      General Comments General comments (skin integrity, edema, etc.): son present      Pertinent Vitals/Pain Pain Assessment: No/denies pain    Home Living  Prior Function            PT Goals (current goals can now be found in the care plan section) Progress towards PT goals: Progressing toward goals    Frequency    Min  4X/week      PT Plan Current plan remains appropriate    Co-evaluation              AM-PAC PT "6 Clicks" Mobility   Outcome Measure  Help needed turning from your back to your side while in a flat bed without using bedrails?: A Little Help needed moving from lying on your back to sitting on the side of a flat bed without using bedrails?: A Little Help needed moving to and from a bed to a chair (including a wheelchair)?: A Little Help needed standing up from a chair using your arms (e.g., wheelchair or bedside chair)?: A Little Help needed to walk in hospital room?: A Lot Help needed climbing 3-5 steps with a railing? : A Lot 6 Click Score: 16    End of Session Equipment Utilized During Treatment: Gait belt Activity Tolerance: Patient tolerated treatment well Patient left: in chair;with call bell/phone within reach;with chair alarm set;with family/visitor present Nurse Communication: Mobility status PT Visit Diagnosis: Hemiplegia and hemiparesis;Other abnormalities of gait and mobility (R26.89) Hemiplegia - Right/Left: Left Hemiplegia - dominant/non-dominant: Non-dominant Hemiplegia - caused by: Cerebral infarction     Time: 4650-3546 PT Time Calculation (min) (ACUTE ONLY): 30 min  Charges:  $Gait Training: 8-22 mins $Therapeutic Activity: 8-22 mins                     [place] Pager [phone] Office [place] 08/29/2019, 4:53 PM

## 2019-08-29 NOTE — Progress Notes (Signed)
STROKE TEAM PROGRESS NOTE   INTERVAL HISTORY RN and Dr. Loleta Books are at the bedside. Pt initially sleeping but arousable and able to following commands. Hard of hearing. Still has right UE ataxia. Had overnight event of aphasia, per pt she knew what she wanted to say but not able to get words out. She felt now she is at her baseline. Overnight CTA showed left ICA 70% stenosis with right CEA patent. MRI showed additional punctate infarcts at right hemisphere but no infarcts on the left. BP was low on 08/26/19 but today 110s, on IVF and bedrest and head of bed flat. Orthostatic vital pending. Discussed with [facility] PA, who will discuss with Dr. Donnetta Hutching regarding plan for left ICA stenosis.  Vitals:   08/29/19 0145 08/29/19 0225 08/29/19 0310 08/29/19 0539  BP: 115/64 (!) 114/54 117/72 (!) 119/56  Pulse: 69 67 79 71  Resp: 20 16 15 20   Temp: 98.7 F (37.1 C)  98.4 F (36.9 C) 99.7 F (37.6 C)  TempSrc: Oral  Oral Oral  SpO2: 95% 99% 95% 96%  Weight:      Height:       CBC:  Recent Labs  Lab 08/22/19 1137 08/22/19 1137 08/23/19 0425 08/24/19 0741 08/28/19 1037 08/29/19 0205  WBC 7.2   < > 5.5   < > 6.6 7.0  NEUTROABS 4.6  --  3.5  --   --   --   HGB 12.2   < > 12.0   < > 9.3* 8.8*  HCT 36.0   < > 36.2   < > 29.5* 27.7*  MCV 88.9   < > 88.1   < > 93.1 91.7  PLT 217   < > 204   < > 191 179   < > = values in this interval not displayed.   Basic Metabolic Panel:  Recent Labs  Lab 08/23/19 0425 08/23/19 0425 08/24/19 0741 08/24/19 0741 08/25/19 0424 08/25/19 0424 08/27/19 0320 08/27/19 0320 08/28/19 1037 08/29/19 0205  NA 142   < > 140   < > 140   < > 142   < > 137 139  K 3.0*   < > 4.2   < > 3.7   < > 3.7   < > 3.7 3.6  CL 108   < > 109   < > 108   < > 114*   < > 108 111  CO2 23   < > 21*   < > 24   < > 20*   < > 20* 21*  GLUCOSE 165*   < > 318*   < > 259*   < > 120*   < > 319* 172*  BUN 5*   < > 10   < > <5*   < > 10   < > 10 9  CREATININE 0.61   < > 0.85   < > 0.72   < > 0.75    < > 0.78 0.80  CALCIUM 8.8*   < > 8.8*   < > 8.9   < > 8.0*   < > 8.1* 8.3*  MG 1.4*   < > 1.6*   < > 2.0  --  1.4*  --   --   --   PHOS 3.8  --  3.2  --   --   --   --   --   --   --    < > = values in this interval not displayed.  Lipid Panel:     Component Value Date/Time   CHOL 204 (H) 08/22/2019 0459   TRIG 338 (H) 08/22/2019 0459   HDL 33 (L) 08/22/2019 0459   CHOLHDL 6.2 08/22/2019 0459   VLDL 68 (H) 08/22/2019 0459   LDLCALC 103 (H) 08/22/2019 0459   HgbA1c:  Lab Results  Component Value Date   HGBA1C 14.2 (H) 08/22/2019   Urine Drug Screen:     Component Value Date/Time   LABOPIA NONE DETECTED 06/20/2017 0019   COCAINSCRNUR NONE DETECTED 06/20/2017 0019   LABBENZ NONE DETECTED 06/20/2017 0019   AMPHETMU NONE DETECTED 06/20/2017 0019   THCU NONE DETECTED 06/20/2017 0019   LABBARB NONE DETECTED 06/20/2017 0019    Alcohol Level     Component Value Date/Time   ETH <10 06/19/2017 2247    IMAGING past 24 hours CT ANGIO HEAD W OR WO CONTRAST  Result Date: 08/29/2019 CLINICAL DATA:  84 year old female with neurologic deficit. Small right hemisphere white matter lacunar infarct on MRI 08/21/2019, and also recent CTA head and neck with extensive atherosclerosis which have progressed since 2019. EXAM: CT ANGIOGRAPHY HEAD AND NECK TECHNIQUE: Multidetector CT imaging of the head and neck was performed using the standard protocol during bolus administration of intravenous contrast. Multiplanar CT image reconstructions and MIPs were obtained to evaluate the vascular anatomy. Carotid stenosis measurements (when applicable) are obtained utilizing NASCET criteria, using the distal internal carotid diameter as the denominator. CONTRAST:  42mL OMNIPAQUE IOHEXOL 350 MG/ML SOLN COMPARISON:  CTA head and neck 08/22/2019. FINDINGS: CT HEAD Brain: Calcified atherosclerosis at the skull base. Stable gray-white matter differentiation throughout the brain. Patchy and confluent bilateral white  matter hypodensity has not significantly changed. No midline shift, ventriculomegaly, mass effect, evidence of mass lesion, intracranial hemorrhage or evidence of cortically based acute infarction. Calvarium and skull base: No acute osseous abnormality identified. Paranasal sinuses: Visualized paranasal sinuses and mastoids are stable and well pneumatized. Orbits: No acute orbit or scalp soft tissue finding. CTA NECK Skeleton: Stable.  No acute osseous abnormality identified. Upper chest: New small to moderate bilateral layering pleural effusions. Stable apical lung scarring. No superior mediastinal lymphadenopathy. Visible central pulmonary arteries appear patent. Other neck: New postoperative changes to the right lower neck, see right carotid findings below. Trace postoperative gas in the right neck. No rim enhancing or drainable fluid collection. Aortic arch: Stable 3 vessel arch configuration an arch atherosclerosis. Right carotid system: Stable brachiocephalic artery plaque without stenosis. Mild right CCA plaque without stenosis proximal to the bifurcation. New right carotid endarterectomy since 08/22/2019, with widely patent right carotid bifurcation and cervical right ICA now to the skull base. Left carotid system: Stable since 08/22/2019. Complex plaque at the proximal left ICA with an estimated 70 % stenosis with respect to the distal vessel (series 9, image 105). Stable tortuosity of the left ICA just distal to the bulb. Vertebral arteries: Stable on the right with no hemodynamically significant stenosis, dominant right vertebral artery. But more apparent on these images today is high-grade stenosis of the proximal left subclavian artery due to bulky calcified plaque approaching a radiographic string sign on series 9, image 154. Stable calcified plaque at the non dominant left vertebral artery origin with only mild stenosis. Stable non dominant left vertebral artery to the skull base. CTA HEAD Posterior  circulation: Dominant right vertebral artery supplies the basilar as before with intermittent mild plaque and no significant stenosis. Patent right PICA origin. Tandem severe stenoses of the distal left V4 segment  are unchanged. Stable irregular basilar artery without high-grade stenosis. Stable PCA and SCA origins with severe bilateral P1/P2 stenoses. Bilateral PCA branches appears stable. Anterior circulation: Both ICA siphons remain patent and heavily calcified. Moderate to severe bilateral siphon stenosis appears stable, including in the supraclinoid segment on series 13, image 98. Stable carotid termini, MCA and ACA origins. ACAs remain normal aside from mild irregularity. Mild to moderate bilateral MCA M1 stenoses are stable. Additional bilateral MCA branch irregularity again noted and stable from the recent CTA. Venous sinuses: Early contrast timing, not well evaluated today. Anatomic variants: Unchanged. Review of the MIP images confirms the above findings IMPRESSION: 1. Right Carotid Endarterectomy since the recent CTA on 08/22/2019 with no adverse features. 2. Also, high-grade stenosis of the proximal Left Subclavian Artery is more apparent today (series 9, image 154), approaching a Radiographic-String-Sign. The non dominant left vertebral artery is stable. 3. Otherwise stable extensive atherosclerosis and stenosis in the head and neck as described on 08/22/2019, most notable for - Severe stenosis of the Left vertebral Artery distal V4 segment. - Left ICA 70% stenosis in the neck. - Moderate to severe Bilateral ICA siphon stenosis. - Severe bilateral PCA P1/P2 stenoses. - Moderate Right MCA distal M1 stenosis. 4. Continued stable non contrast CT appearance of the brain. No new intracranial abnormality. 5. New small to moderate bilateral layering pleural effusions. Electronically Signed   By: Genevie Ann M.D.   On: 08/29/2019 03:32   CT ANGIO NECK W OR WO CONTRAST  Result Date: 08/29/2019 CLINICAL DATA:   84 year old female with neurologic deficit. Small right hemisphere white matter lacunar infarct on MRI 08/21/2019, and also recent CTA head and neck with extensive atherosclerosis which have progressed since 2019. EXAM: CT ANGIOGRAPHY HEAD AND NECK TECHNIQUE: Multidetector CT imaging of the head and neck was performed using the standard protocol during bolus administration of intravenous contrast. Multiplanar CT image reconstructions and MIPs were obtained to evaluate the vascular anatomy. Carotid stenosis measurements (when applicable) are obtained utilizing NASCET criteria, using the distal internal carotid diameter as the denominator. CONTRAST:  56mL OMNIPAQUE IOHEXOL 350 MG/ML SOLN COMPARISON:  CTA head and neck 08/22/2019. FINDINGS: CT HEAD Brain: Calcified atherosclerosis at the skull base. Stable gray-white matter differentiation throughout the brain. Patchy and confluent bilateral white matter hypodensity has not significantly changed. No midline shift, ventriculomegaly, mass effect, evidence of mass lesion, intracranial hemorrhage or evidence of cortically based acute infarction. Calvarium and skull base: No acute osseous abnormality identified. Paranasal sinuses: Visualized paranasal sinuses and mastoids are stable and well pneumatized. Orbits: No acute orbit or scalp soft tissue finding. CTA NECK Skeleton: Stable.  No acute osseous abnormality identified. Upper chest: New small to moderate bilateral layering pleural effusions. Stable apical lung scarring. No superior mediastinal lymphadenopathy. Visible central pulmonary arteries appear patent. Other neck: New postoperative changes to the right lower neck, see right carotid findings below. Trace postoperative gas in the right neck. No rim enhancing or drainable fluid collection. Aortic arch: Stable 3 vessel arch configuration an arch atherosclerosis. Right carotid system: Stable brachiocephalic artery plaque without stenosis. Mild right CCA plaque without  stenosis proximal to the bifurcation. New right carotid endarterectomy since 08/22/2019, with widely patent right carotid bifurcation and cervical right ICA now to the skull base. Left carotid system: Stable since 08/22/2019. Complex plaque at the proximal left ICA with an estimated 70 % stenosis with respect to the distal vessel (series 9, image 105). Stable tortuosity of the left ICA just distal to the  bulb. Vertebral arteries: Stable on the right with no hemodynamically significant stenosis, dominant right vertebral artery. But more apparent on these images today is high-grade stenosis of the proximal left subclavian artery due to bulky calcified plaque approaching a radiographic string sign on series 9, image 154. Stable calcified plaque at the non dominant left vertebral artery origin with only mild stenosis. Stable non dominant left vertebral artery to the skull base. CTA HEAD Posterior circulation: Dominant right vertebral artery supplies the basilar as before with intermittent mild plaque and no significant stenosis. Patent right PICA origin. Tandem severe stenoses of the distal left V4 segment are unchanged. Stable irregular basilar artery without high-grade stenosis. Stable PCA and SCA origins with severe bilateral P1/P2 stenoses. Bilateral PCA branches appears stable. Anterior circulation: Both ICA siphons remain patent and heavily calcified. Moderate to severe bilateral siphon stenosis appears stable, including in the supraclinoid segment on series 13, image 98. Stable carotid termini, MCA and ACA origins. ACAs remain normal aside from mild irregularity. Mild to moderate bilateral MCA M1 stenoses are stable. Additional bilateral MCA branch irregularity again noted and stable from the recent CTA. Venous sinuses: Early contrast timing, not well evaluated today. Anatomic variants: Unchanged. Review of the MIP images confirms the above findings IMPRESSION: 1. Right Carotid Endarterectomy since the recent CTA  on 08/22/2019 with no adverse features. 2. Also, high-grade stenosis of the proximal Left Subclavian Artery is more apparent today (series 9, image 154), approaching a Radiographic-String-Sign. The non dominant left vertebral artery is stable. 3. Otherwise stable extensive atherosclerosis and stenosis in the head and neck as described on 08/22/2019, most notable for - Severe stenosis of the Left vertebral Artery distal V4 segment. - Left ICA 70% stenosis in the neck. - Moderate to severe Bilateral ICA siphon stenosis. - Severe bilateral PCA P1/P2 stenoses. - Moderate Right MCA distal M1 stenosis. 4. Continued stable non contrast CT appearance of the brain. No new intracranial abnormality. 5. New small to moderate bilateral layering pleural effusions. Electronically Signed   By: Genevie Ann M.D.   On: 08/29/2019 03:32   MR BRAIN WO CONTRAST  Result Date: 08/29/2019 CLINICAL DATA:  Stroke, follow-up. EXAM: MRI HEAD WITHOUT CONTRAST TECHNIQUE: Multiplanar, multiecho pulse sequences of the brain and surrounding structures were obtained without intravenous contrast. COMPARISON:  CT angiogram head/neck 08/29/2019, CT angiogram head/neck 08/22/2019, brain MRI 08/21/2019 FINDINGS: Brain: At the provider's request, a limited protocol MRI was performed. Only axial and coronal diffusion-weighted sequences, as well as an axial T2/FLAIR sequence, were obtained. Redemonstrated is a 6 mm, now subacute, infarct within the posterior right frontal lobe periventricular white matter (series 2, image 35). However new as compared to the MRI of 08/21/2019, there are small acute/early subacute cortical infarcts within the right pre and postcentral gyri measuring up to 10 mm (series 2, image 42) (series 2, image 41). Also new from this prior examination, there is a punctate acute/early subacute infarct within the Peri ventricle right parietooccipital lobes (series 2, image 24). Stable background moderate patchy T2/FLAIR hyperintensity  within the cerebral white matter and pons which is nonspecific, but consistent with chronic small vessel ischemic disease. Redemonstrated chronic lacunar infarct within the anterior right external capsule. Stable, mild generalized parenchymal atrophy. Vascular: Flow voids poorly assessed on the acquired sequences. Skull and upper cervical spine: No focal marrow lesion is identified on the acquired sequences. Sinuses/Orbits: No acute orbital abnormality identified on the acquired sequences. Mild ethmoid sinus mucosal thickening. IMPRESSION: Limited protocol brain MRI as described. Small (  measuring up to 10 mm) acute/early subacute cortical infarcts within the right pre and postcentral gyri, new as compared to prior MRI 08/21/2019. Also new from this prior exam, there is a punctate acute/early subacute infarct within the periventricular right frontoparietal lobes. Redemonstrated 6 mm, now subacute, infarct within the posterior right frontal lobe periventricular white matter. Stable background mild generalized parenchymal atrophy and moderate chronic small vessel ischemic disease. Mild ethmoid sinus mucosal thickening. Electronically Signed   By: Kellie Simmering DO   On: 08/29/2019 09:14    PHYSICAL EXAM  Temp:  [97.6 F (36.4 C)-99.7 F (37.6 C)] 99 F (37.2 C) (07/12 1144) Pulse Rate:  [67-79] 73 (07/12 1152) Resp:  [14-22] 22 (07/12 1152) BP: (112-129)/(54-72) 120/58 (07/12 1152) SpO2:  [95 %-100 %] 97 % (07/12 1152)  General - Well nourished, well developed, initially sleepy but easily arousable.  Ophthalmologic - fundi not visualized due to noncooperation.  Cardiovascular - Regular rhythm and rate.  Mental Status -  Level of arousal and orientation to time, place, and person were intact. Language including expression, naming, repetition, comprehension was assessed and found intact.  Cranial Nerves II - XII - II - Visual field intact OU. III, IV, VI - Extraocular movements intact. V - Facial  sensation intact bilaterally. VII - Facial movement intact bilaterally. VIII - Hard of hearing & vestibular intact bilaterally. X - Palate elevates symmetrically. XI - Chin turning & shoulder shrug intact bilaterally. XII - Tongue protrusion intact.  Motor Strength - The patient's strength was 4/5 BUEs and 3/5 BLEs and pronator drift was absent.  Bulk was normal and fasciculations were absent.   Motor Tone - Muscle tone was assessed at the neck and appendages and was normal.  Reflexes - The patient's reflexes were symmetrical in all extremities and she had no pathological reflexes.  Sensory - Light touch, temperature/pinprick were assessed and were symmetrical.    Coordination - The patient had normal movements in the right FTN with no ataxia or dysmetria. However, left FTN severely ataxic.  Tremor was absent.  Gait and Station - deferred.   ASSESSMENT/PLAN Ms. Sharifa Bucholz is a 84 y.o. female with history of atrial fibrillation on Eliquis, diabetes, bilateral ICA stenosis, hyperlipidemia, fibromyalgia, headaches, multiple TIAs with left-sided symptoms with no residual deficits presenting last week with Left-sided weakness, altered mental status, urinary frequency, hyperglycemia. Found to have R MCA small subcortical infarcts in setting of large vessel disease from R ICA. S/p R CEA 08/26/19. Post op w/ inhospital worsening w/ waxing/waning expressive aphasia and slurred speech on 7/11. MRI with new right hemisphere punctate infarcts.  Stroke:  R MCA small subcortical infarct in setting of severe intracranial large vessel disease, especially high grade R ICA stenosis s/p R CEA 7/9 Stroke:  Inhospital new punctate R watershed infarcts s/p R CEA, likely related to procedure.   CT head No acute abnormality. Small vessel disease. Atrophy.   MRI 7/4 Posterior R frontal corona radiata / centrum semiovale periventricular infarct   CTA head & neck 7/5 progressive chronic severe atherosclerosis  (new severe distal L V4, new small caliber BA, string sign proximal R ICA, moderate to severe L ICA siphon, moderate to severe L ICA supraclinoid, stable proximal left ICA 70% stenosis, B P1/2, moderate R M1)  CTA head & neck 7/12 s/p R CEA patent. L subclavian w/ high-grade stenosis. O/w stable atherosclerosis as on 7/5 including left ICA 70% stenosis in the neck. New small to moderate B layering pleural effusions.  MRI 7/12 new small acute/subacute R pre & postcentral gyri infarcts.  New punctate R frontoparietal periventricular infarct. Stable R frontal periventricular white matter infarct  2D Echo EF 65-70 %. No source of embolus   EEG normal  LDL 103  HgbA1c 14.2  Eliquis for VTE prophylaxis  Eliquis (apixaban) daily prior to admission, now on aspirin 81 mg daily and Eliquis (apixaban) daily. Continue on discharge.  Therapy recommendations:  CIR  Disposition:  pending   Left brain TIA  7/11 overnight episode of expressive aphasia - resolved  MRI no acute infarct at left hemisphere  CUS 7/6 - right 80-99% and left 40-59% stenosis  CTA 7/5 and 7/12 left ICA in the neck 70% stenosis, stable  [facility]VS contacted to decide on left CEA or close monitoring  On ASA and eliquis, continue  Atrial Fibrillation  Home anticoagulation:  Eliquis (apixaban) daily, continued in the hospital . Continue Eliquis (apixaban) daily at discharge   Carotid Stenosis   CTA neck R ICA string sign, L ICA 70%  S/p R CEA 08/26/2019  Follow up with VVS as outpt  Hx of stroke  06/2017 admitted for slurry speech and left arm numbness. CTA head and neck right ICA 70% and left ICA 50-70% stenosis, left ICA siphon severe stenosis, b/l M1 stenosis. LDL 79 and A1C 8.8. recommend right CEA. Pt followed with VVS as outpt, refused procedure at that time.  Hypotension Hx Hypertension  Low BP post R CEA  Now on bedrest, head of bed flat  Avoid hypotension, currently BP 110s  Orthostatic vital pending   . Long-term BP goal 130-150 give severe intracranial and neck atherosclerosis . Recommend midodrine if BP not at goal.    Hyperlipidemia  Home meds:  No statin  Now on lipitor 40  LDL 103, goal < 70  Continue statin at discharge  Diabetes type II Uncontrolled  HgbA1c 14.2, goal < 7.0  Hyperglycemia  SSI  CBG monitoring  DM education  Close PCP follow up for better DM control  Other Stroke Risk Factors  Advanced age  Family hx stroke (father)  Other Active Problems  Baseline mild dementia  Anxiety, depression  Hypothyroid   Frequent UTI  Hospital day # 7  Rosalin Hawking, MD PhD Stroke Neurology 08/29/2019 1:02 PM   To contact Stroke Continuity provider, please refer to [email or web address] After hours, contact General Neurology

## 2019-08-29 NOTE — Discharge Instructions (Signed)
[facility]  Discharge Instructions   Carotid Endarterectomy (CEA)  Please refer to the following instructions for your post-procedure care. Your surgeon or physician assistant will discuss any changes with you.  Activity  You are encouraged to walk as much as you can. You can slowly return to normal activities but must avoid strenuous activity and heavy lifting until your doctor tell you it's okay. Avoid activities such as vacuuming or swinging a golf club. You can drive after one week if you are comfortable and you are no longer taking prescription pain medications. It is normal to feel tired for serval weeks after your surgery. It is also normal to have difficulty with sleep habits, eating, and bowel movements after surgery. These will go away with time.  Bathing/Showering  Shower daily after you go home. Do not soak in a bathtub, hot tub, or swim until the incision heals completely.  Incision Care  Shower every day. Clean your incision with mild soap and water. Pat the area dry with a clean towel. You do not need a bandage unless otherwise instructed. Do not apply any ointments or creams to your incision. You may have skin glue on your incision. Do not peel it off. It will come off on its own in about one week. Your incision may feel thickened and raised for several weeks after your surgery. This is normal and the skin will soften over time.   For Men Only: It's okay to shave around the incision but do not shave the incision itself for 2 weeks. It is common to have numbness under your chin that could last for several months.  Diet  Resume your normal diet. There are no special food restrictions following this procedure. A low fat/low cholesterol diet is recommended for all patients with vascular disease. In order to heal from your surgery, it is CRITICAL to get adequate nutrition. Your body requires vitamins, minerals, and protein. Vegetables are the  best source of vitamins and minerals. Vegetables also provide the perfect balance of protein. Processed food has little nutritional value, so try to avoid this.  Medications  Resume taking all of your medications unless your doctor or physician assistant tells you not to. If your incision is causing pain, you may take over-the- counter pain relievers such as acetaminophen (Tylenol). If you were prescribed a stronger pain medication, please be aware these medications can cause nausea and constipation. Prevent nausea by taking the medication with a snack or meal. Avoid constipation by drinking plenty of fluids and eating foods with a high amount of fiber, such as fruits, vegetables, and grains.   Do not take Tylenol if you are taking prescription pain medications.  Follow Up  Our office will schedule a follow up appointment 2-3 weeks following discharge.  Please call us immediately for any of the following conditions  . Increased pain, redness, drainage (pus) from your incision site. . Fever of 101 degrees or higher. . If you should develop stroke (slurred speech, difficulty swallowing, weakness on one side of your body, loss of vision) you should call 911 and go to the nearest emergency room. .  Reduce your risk of vascular disease:  . Stop smoking. If you would like help call [facility] at 1-800-QUIT-NOW (562)139-2515) or [place] at [phone]. . Manage your cholesterol . Maintain a desired weight . Control your diabetes . Keep your blood pressure down .  If you have any questions, please call the office at [phone].  Information on my medicine - ELIQUIS (apixaban)  This medication education was reviewed with me or my healthcare representative as part of my discharge preparation.  The pharmacist that spoke with me during my hospital stay was:  Onnie Boer, RPH-[name]  Why was Eliquis prescribed for you? Eliquis was prescribed for you to reduce the risk of a blood clot  forming that can cause a stroke if you have a medical condition called atrial fibrillation (a type of irregular heartbeat).  What do You need to know about Eliquis ? Take your Eliquis TWICE DAILY - one tablet in the morning and one tablet in the evening with or without food. If you have difficulty swallowing the tablet whole please discuss with your pharmacist how to take the medication safely.  Take Eliquis exactly as prescribed by your doctor and DO NOT stop taking Eliquis without talking to the doctor who prescribed the medication.  Stopping may increase your risk of developing a stroke.  Refill your prescription before you run out.  After discharge, you should have regular check-up appointments with your healthcare provider that is prescribing your Eliquis.  In the future your dose may need to be changed if your kidney function or weight changes by a significant amount or as you get older.  What do you do if you miss a dose? If you miss a dose, take it as soon as you remember on the same day and resume taking twice daily.  Do not take more than one dose of ELIQUIS at the same time to make up a missed dose.  Important Safety Information A possible side effect of Eliquis is bleeding. You should call your healthcare provider right away if you experience any of the following: ? Bleeding from an injury or your nose that does not stop. ? Unusual colored urine (red or dark brown) or unusual colored stools (red or black). ? Unusual bruising for unknown reasons. ? A serious fall or if you hit your head (even if there is no bleeding).  Some medicines may interact with Eliquis and might increase your risk of bleeding or clotting while on Eliquis. To help avoid this, consult your healthcare provider or pharmacist prior to using any new prescription or non-prescription medications, including herbals, vitamins, non-steroidal anti-inflammatory drugs (NSAIDs) and supplements.  This website has more  information on Eliquis (apixaban): http://[email or web address].com/eliquis/home

## 2019-08-29 NOTE — Progress Notes (Signed)
Interim Note  89-year female with history of A. fib on Eliquis presented with fusion right-sided weakness on 08/21/2019 to have ischemic right MCA stroke and underwent right CEA on 7 9 21.   Today patient was noted to have difficulty with language expressing her words and slurred speech that has been waxing and waning approximately 10 PM.  Hospitalist called neurology for further recommendations.  Patient's blood pressure has been low and recommended IV fluids and Trendelenburg position, following which symptoms improved.  Also recommended stat CT head to rule out hemorrhage as patient on Eliquis and CT angiogram given patient's bilateral carotid stenosis status post right CEA-CT head was unremarkable for hemorrhage.  CTA redemonstrated 70% stenosis of the left carotid and patent right carotid with postsurgical changes from CEA.   Subjective: Patient is back to her baseline   ROS: negative except above Examination  Vital signs in last 24 hours: Temp:  [97.6 F (36.4 C)-99.7 F (37.6 C)] 99.7 F (37.6 C) (07/12 0539) Pulse Rate:  [67-79] 71 (07/12 0539) Resp:  [14-20] 20 (07/12 0539) BP: (112-129)/(52-76) 119/56 (07/12 0539) SpO2:  [95 %-100 %] 96 % (07/12 0539)  General: lying in bed CVS: pulse-normal rate and rhythm RS: breathing comfortably Extremities: normal   Neuro: MS: Alert, oriented, follows commands CN: pupils equal and reactive,  EOMI, face symmetric, tongue midline, normal sensation over face, Motor: 5/5 strength in all 4 extremities Reflexes: 2+ bilaterally over patella, biceps, plantars: flexor Coordination: normal Gait: not tested  Basic Metabolic Panel: Recent Labs  Lab 08/23/19 0425 08/23/19 0425 08/24/19 0741 08/24/19 0741 08/25/19 0424 08/25/19 0424 08/27/19 0320 08/28/19 1037 08/29/19 0205  NA 142   < > 140  --  140  --  142 137 139  K 3.0*   < > 4.2  --  3.7  --  3.7 3.7 3.6  CL 108   < > 109  --  108  --  114* 108 111  CO2 23   < > 21*  --  24   --  20* 20* 21*  GLUCOSE 165*   < > 318*  --  259*  --  120* 319* 172*  BUN 5*   < > 10  --  <5*  --  10 10 9   CREATININE 0.61   < > 0.85  --  0.72  --  0.75 0.78 0.80  CALCIUM 8.8*   < > 8.8*   < > 8.9   < > 8.0* 8.1* 8.3*  MG 1.4*  --  1.6*  --  2.0  --  1.4*  --   --   PHOS 3.8  --  3.2  --   --   --   --   --   --    < > = values in this interval not displayed.    CBC: Recent Labs  Lab 08/22/19 1137 08/22/19 1137 08/23/19 0425 08/24/19 0741 08/25/19 0424 08/26/19 0602 08/27/19 0320 08/28/19 1037 08/29/19 0205  WBC 7.2   < > 5.5   < > 6.4 5.7 8.3 6.6 7.0  NEUTROABS 4.6  --  3.5  --   --   --   --   --   --   HGB 12.2   < > 12.0   < > 11.3* 11.8* 9.1* 9.3* 8.8*  HCT 36.0   < > 36.2   < > 34.6* 36.6 28.1* 29.5* 27.7*  MCV 88.9   < > 88.1   < >  89.4 89.9 93.0 93.1 91.7  PLT 217   < > 204   < > 205 221 208 191 179   < > = values in this interval not displayed.     Coagulation Studies: No results for input(s): LABPROT, INR in the last 72 hours.  Imaging Reviewed: CT head and CT angiogram.    ASSESSMENT AND PLAN  Cerebral hypoperfusion versus acute ischemic stroke  Recommendations MRI brain without contrast when stable to rule out infarcts Continue Eliquis Avoid hypotension Every 4 hours neurochecks  Stroke team will follow   [place] Pager Number [phone] For questions after 7pm please refer to AMION to reach the Neurologist on call

## 2019-08-29 NOTE — Significant Event (Addendum)
Called regarding change in speech.   Jaclyn [name] has hx of a fib on Eliquis, presented with confusion and right-sided weakness on 08/21/19, was found to have ischemic right MCA stroke, and underwent right CEA on 08/26/19.    BP had been low on 7/10 and she was given 2 liters IVF and dopamine. SBP has been 100-130 for the past day.   She had been doing well tonight until developing some confusion, expressive aphasia, and dysarthria ~4 hrs ago. This apparently improved significantly before worsening again almost an hour ago. Has since improved significantly again.   On exam, she is alert, has gross hearing deficit and mild dysarthria, no facial weakness. Proximal RLE strength is 4/5 and strength otherwise 5/5. Marland Kitchen   Discussed with neurology, appreciate recommendations, will get STAT head CT, keep head flat to maintain perfusion. Discussed plan with RN at bedside. Son updated at bedside.

## 2019-08-29 NOTE — Progress Notes (Signed)
Orthostatics - BP/HR Lying  129/63  73 Sitting   123/60  74 Standing  120/58  73

## 2019-08-29 NOTE — Significant Event (Addendum)
Rapid Response Event Note  Overview: Called d/t dysphagia and slurred speech.   Pt is admitted for R MCA infarct, R carotid stenosis. She is s/p R CEA 7/9.  Initial Focused Assessment: Pt laying in bed with eyes open. She is very hard of hearing. She knows her name and where she is. She says she doesn't know what's going on. She denies headache. She will follow commands and move all extremities equally. Pupils 4, equal, and brisk. She is dysarthric and having expressive aphasia. NIH-2 for dysarthria and aphasia. T-98.7, HR-69, BP-115/64, RR-20, 96% on RA.   Interventions: CTA head STAT Plan of Care (if not transferred): Await CTA results and relay results to MD. Continue to monitor pt. Call RRT if further assistance needed.  Event Summary:  Dr. Myna Hidalgo notified by bedside RN and came to bedside.  Dr. Lorraine Lax notified by Dr. Myna Hidalgo at [facility]: [phone] Arrived: 0147  Dillard [place]

## 2019-08-29 NOTE — Care Management Important Message (Signed)
Important Message  Patient Details  Name: Jaclyn [name] MRN: [id number] Date of Birth: Jan 26, 1936   Medicare Important Message Given:  Yes     Shelda Altes 08/29/2019, 9:10 AM

## 2019-08-29 NOTE — Progress Notes (Signed)
PT Cancellation Note  Patient Details Name: Jaclyn [name] MRN: [id number] DOB: Feb 01, 1936   Cancelled Treatment:    Reason Eval/Treat Not Completed: Active bedrest order. Significant events overnight. Pt now with new bedrest order. PT to follow and resume sessions when medically cleared.   Lorriane Shire 08/29/2019, 8:07 AM  Lorrin Goodell, PT  Office # [phone] Pager [phone]

## 2019-08-30 ENCOUNTER — Inpatient Hospital Stay (HOSPITAL_COMMUNITY): Payer: Medicare Other

## 2019-08-30 DIAGNOSIS — G459 Transient cerebral ischemic attack, unspecified: Secondary | ICD-10-CM

## 2019-08-30 DIAGNOSIS — R569 Unspecified convulsions: Secondary | ICD-10-CM

## 2019-08-30 LAB — GLUCOSE, CAPILLARY
Glucose-Capillary: 120 mg/dL — ABNORMAL HIGH (ref 70–99)
Glucose-Capillary: 244 mg/dL — ABNORMAL HIGH (ref 70–99)
Glucose-Capillary: 194 mg/dL — ABNORMAL HIGH (ref 70–99)

## 2019-08-30 MED ORDER — MIDODRINE HCL 5 MG PO TABS
10.0000 mg | ORAL_TABLET | Freq: Three times a day (TID) | ORAL | Status: DC
  Filled 2019-08-30 (×2): qty 2

## 2019-08-30 NOTE — Progress Notes (Signed)
STROKE TEAM PROGRESS NOTE   INTERVAL HISTORY Son at bedside. Pt lying in bed, neuro stable. BP 150s on midodrine 10 tid. No acute event overnight. Pending CIR admission.   Vitals:   08/30/19 1955 08/31/19 0013 08/31/19 0510 08/31/19 0813  BP: (!) 165/69 (!) 130/58 (!) 155/76 (!) 142/75  Pulse: 73 73 73 69  Resp: 17 13 15 17   Temp: 98.6 F (37 C) 98.5 F (36.9 C) 99.5 F (37.5 C) 98.2 F (36.8 C)  TempSrc: Oral Oral Oral Axillary  SpO2: 96% 98% 94% 92%  Weight:      Height:       CBC:  Recent Labs  Lab 08/29/19 0205 08/31/19 0352  WBC 7.0 6.4  HGB 8.8* 9.1*  HCT 27.7* 28.3*  MCV 91.7 91.3  PLT 179 443   Basic Metabolic Panel:  Recent Labs  Lab 08/25/19 0424 08/25/19 0424 08/27/19 0320 08/28/19 1037 08/29/19 0205 08/31/19 0352  NA 140   < > 142   < > 139 138  K 3.7   < > 3.7   < > 3.6 3.2*  CL 108   < > 114*   < > 111 106  CO2 24   < > 20*   < > 21* 21*  GLUCOSE 259*   < > 120*   < > 172* 173*  BUN <5*   < > 10   < > 9 5*  CREATININE 0.72   < > 0.75   < > 0.80 0.61  CALCIUM 8.9   < > 8.0*   < > 8.3* 8.1*  MG 2.0  --  1.4*  --   --   --    < > = values in this interval not displayed.   Lipid Panel:     Component Value Date/Time   CHOL 204 (H) 08/22/2019 0459   TRIG 338 (H) 08/22/2019 0459   HDL 33 (L) 08/22/2019 0459   CHOLHDL 6.2 08/22/2019 0459   VLDL 68 (H) 08/22/2019 0459   LDLCALC 103 (H) 08/22/2019 0459   HgbA1c:  Lab Results  Component Value Date   HGBA1C 14.2 (H) 08/22/2019   Urine Drug Screen:     Component Value Date/Time   LABOPIA NONE DETECTED 06/20/2017 0019   COCAINSCRNUR NONE DETECTED 06/20/2017 0019   LABBENZ NONE DETECTED 06/20/2017 0019   AMPHETMU NONE DETECTED 06/20/2017 0019   THCU NONE DETECTED 06/20/2017 0019   LABBARB NONE DETECTED 06/20/2017 0019    Alcohol Level     Component Value Date/Time   ETH <10 06/19/2017 2247    IMAGING past 24 hours EEG adult  Result Date: 08/30/2019 [name], MD     08/30/2019   4:31 PM Patient Name: Jaclyn [name] MRN: [id number] Epilepsy Attending: [name] Referring Physician/Provider: Dr Rosalin Hawking Date: 08/30/2019 Duration: 24.27 mins Patient history: 84yo F with acute right hemispheric strokes and episodes of transient aphasia. EEG to evaluate for seizure Level of alertness: Awake, asleep AEDs during EEG study: None Technical aspects: This EEG study was done with scalp electrodes positioned according to the 10-20 International system of electrode placement. Electrical activity was acquired at a sampling rate of 500Hz  and reviewed with a high frequency filter of 70Hz  and a low frequency filter of 1Hz . EEG data were recorded continuously and digitally stored. Description: The posterior dominant rhythm consists of 7.5-8 Hz activity of moderate voltage (25-35 uV) seen predominantly in posterior head regions, symmetric and reactive to eye opening and eye closing.  Sleep was characterized by vertex waves, sleep spindles (12 to 14 Hz), maximal frontocentral region. Hyperventilation and photic stimulation were not performed.   IMPRESSION: This study is within normal limits. No seizures or epileptiform discharges were seen throughout the recording. Jaclyn [name]    PHYSICAL EXAM   Temp:  [98.2 F (36.8 C)-99.5 F (37.5 C)] 98.2 F (36.8 C) (07/14 0813) Pulse Rate:  [65-78] 69 (07/14 0813) Resp:  [13-27] 17 (07/14 0813) BP: (130-171)/(58-85) 142/75 (07/14 0813) SpO2:  [92 %-98 %] 92 % (07/14 0813)  General - Well nourished, well developed, not in acute distress.  Ophthalmologic - fundi not visualized due to noncooperation.  Cardiovascular - Regular rhythm and rate.  Mental Status -  Level of arousal and orientation to time, place, and person were intact. Language including expression, naming, repetition, comprehension was assessed and found intact.  Cranial Nerves II - XII - II - Visual field intact OU. III, IV, VI - Extraocular movements intact. V - Facial  sensation intact bilaterally. VII - Facial movement intact bilaterally. VIII - Hard of hearing & vestibular intact bilaterally. X - Palate elevates symmetrically. XI - Chin turning & shoulder shrug intact bilaterally. XII - Tongue protrusion intact.  Motor Strength - The patient's strength was 4/5 BUEs and 3/5 BLEs and pronator drift was absent.  Bulk was normal and fasciculations were absent.   Motor Tone - Muscle tone was assessed at the neck and appendages and was normal.  Reflexes - The patient's reflexes were symmetrical in all extremities and she had no pathological reflexes.  Sensory - Light touch, temperature/pinprick were assessed and were symmetrical.    Coordination - The patient had normal movements in the right FTN with no ataxia or dysmetria. However, left FTN severely ataxic.  Tremor was absent.  Gait and Station - deferred.   ASSESSMENT/PLAN Jaclyn [name] is a 84 y.o. female with history of atrial fibrillation on Eliquis, diabetes, bilateral ICA stenosis, hyperlipidemia, fibromyalgia, headaches, multiple TIAs with left-sided symptoms with no residual deficits presenting last week with Left-sided weakness, altered mental status, urinary frequency, hyperglycemia. Found to have R MCA small subcortical infarcts in setting of large vessel disease from R ICA. S/p R CEA 08/26/19. Post op w/ inhospital worsening w/ waxing/waning expressive aphasia and slurred speech on 7/11. MRI with new right hemisphere punctate infarcts.  Stroke:  R MCA small subcortical infarct in setting of severe intracranial large vessel disease, especially high grade R ICA stenosis s/p R CEA 7/9 Stroke:  Inhospital new punctate R watershed infarcts s/p R CEA, likely related to procedure.   CT head No acute abnormality. Small vessel disease. Atrophy.   MRI 7/4 Posterior R frontal corona radiata / centrum semiovale periventricular infarct   CTA head & neck 7/5 progressive chronic severe atherosclerosis  (new severe distal L V4, new small caliber BA, string sign proximal R ICA, moderate to severe L ICA siphon, moderate to severe L ICA supraclinoid, stable proximal left ICA 70% stenosis, B P1/2, moderate R M1)  CTA head & neck 7/12 s/p R CEA patent. L subclavian w/ high-grade stenosis. O/w stable atherosclerosis as on 7/5 including left ICA 70% stenosis in the neck. New small to moderate B layering pleural effusions.   MRI 7/12 new small acute/subacute R pre & postcentral gyri infarcts.  New punctate R frontoparietal periventricular infarct. Stable R frontal periventricular white matter infarct  2D Echo EF 65-70 %. No source of embolus   EEG normal, repeat EEG normal  LDL  103  HgbA1c 14.2  Eliquis for VTE prophylaxis  Eliquis (apixaban) daily prior to admission, now on aspirin 81 mg daily and Eliquis (apixaban) daily. Continue on discharge.  Therapy recommendations:  CIR  Disposition:  CIR 7/14  Left brain TIA, recurrent  7/11 overnight episode of expressive aphasia - resolved  7/13 morning episode of lethargy and aphasia while sitting and BP 120s - resolved with BP up to 140s  MRI 7/12 no acute infarct at left hemisphere  CUS 7/6 - right 80-99% and left 40-59% stenosis  CTA 7/5 and 7/12 left ICA in the neck 70% stenosis, stable. Moderate to severe stenosis at left supraclinoid and siphon.   EEG 7/5 and 7/13 both normal, no seizure  [facility] Dr. Donnetta Hutching does not feel emergent left CEA needed at this time  Orthostatic vital neg 7/12  On ASA and eliquis, continue  On midodrine dose and BP goal 140-160.  Avoid long duration of sitting or standing  Atrial Fibrillation  Home anticoagulation:  Eliquis (apixaban) daily, continued in the hospital . Continue Eliquis (apixaban) daily at discharge   Carotid Stenosis   CTA neck R ICA string sign, L ICA 70%  S/p R CEA 08/26/2019  CTA 7/5 and 7/12 also showed moderate to severe stenosis at left supraclinoid and siphon.   Follow up  with [facility] Dr. Donnetta Hutching as outpt   Hx of stroke  06/2017 admitted for slurry speech and left arm numbness. CTA head and neck right ICA 70% and left ICA 50-70% stenosis, left ICA siphon severe stenosis, b/l M1 stenosis. LDL 79 and A1C 8.8. recommend right CEA. Pt followed with [facility] as outpt, refused procedure at that time.  Hypotension Hx Hypertension  Low BP post R CEA  Now on bedrest, head of bed flat  Avoid hypotension, currently BP 110s  Orthostatic vital unremarkable 7/12   Long-term BP goal 140-160 give severe intracranial and extracranial atherosclerosis as well as BP dependent   On midodrine 10mg  tid.  Hyperlipidemia  Home meds:  No statin  Now on lipitor 40  LDL 103, goal < 70  Continue statin at discharge  Diabetes type II Uncontrolled  HgbA1c 14.2, goal < 7.0  Hyperglycemia  SSI  CBG monitoring  DM education  Close PCP follow up for better DM control  Other Stroke Risk Factors  Advanced age  Family hx stroke (father)  Other Active Problems  Baseline mild dementia  Anxiety, depression  Hypothyroid   Frequent UTI  Hypokalemia   Hospital day # 9  Neurology will sign off. Please call with questions. Pt will follow up with stroke clinic Dr. Leonie Man at [facility] in about 4 weeks. Thanks for the consult.   Rosalin Hawking, MD PhD Stroke Neurology 08/31/2019 11:06 AM   To contact Stroke Continuity provider, please refer to [email or web address] After hours, contact General Neurology

## 2019-08-30 NOTE — Progress Notes (Addendum)
STROKE TEAM PROGRESS NOTE   INTERVAL HISTORY Pt seen along with son, RN and Dr. Loleta Books. As per RN and Dr. Loleta Books, pt had been up with PT/OT, then sat down to eat breakfast. While eating, she become lethargic, drowsy, altered mental status. BP 120s at that time. On Dr. Loleta Books exam, pt drowsy sleepy but arousable, garbled words, difficulty repeating, expressive aphaia. Put pt back to bed, gave IV bolus, and head of bed flat. About 20-30 min later, her BP 140s and her symptoms resolved.   Dr. Donnetta Hutching talked with son yesterday and felt no emergent needs for left CEA at this time. Of note, she has left ICA bulb 70% stenosis with bulky calcified plaques, left ICA supraclinoid and siphon mod to severe stenosis. Given these tandem stenosis and symptoms of aphasia, concerning for left ICA hypoperfusion. Discussed with Dr. Loleta Books, recommend increase midodrine and keep BP higher in the range.   Vitals:   08/30/19 0808 08/30/19 1056 08/30/19 1103 08/30/19 1126  BP: (!) 136/58 (!) 122/55 128/70 (!) 147/74  Pulse: 61 77 (!) 50 65  Resp: (!) 21   (!) 21  Temp: 98.7 F (37.1 C)     TempSrc: Oral     SpO2: 98% 96% 90% 93%  Weight:      Height:       CBC:  Recent Labs  Lab 08/28/19 1037 08/29/19 0205  WBC 6.6 7.0  HGB 9.3* 8.8*  HCT 29.5* 27.7*  MCV 93.1 91.7  PLT 191 803   Basic Metabolic Panel:  Recent Labs  Lab 08/24/19 0741 08/24/19 0741 08/25/19 0424 08/25/19 0424 08/27/19 0320 08/27/19 0320 08/28/19 1037 08/29/19 0205  NA 140   < > 140   < > 142   < > 137 139  K 4.2   < > 3.7   < > 3.7   < > 3.7 3.6  CL 109   < > 108   < > 114*   < > 108 111  CO2 21*   < > 24   < > 20*   < > 20* 21*  GLUCOSE 318*   < > 259*   < > 120*   < > 319* 172*  BUN 10   < > <5*   < > 10   < > 10 9  CREATININE 0.85   < > 0.72   < > 0.75   < > 0.78 0.80  CALCIUM 8.8*   < > 8.9   < > 8.0*   < > 8.1* 8.3*  MG 1.6*   < > 2.0  --  1.4*  --   --   --   PHOS 3.2  --   --   --   --   --   --   --    < > =  values in this interval not displayed.   Lipid Panel:     Component Value Date/Time   CHOL 204 (H) 08/22/2019 0459   TRIG 338 (H) 08/22/2019 0459   HDL 33 (L) 08/22/2019 0459   CHOLHDL 6.2 08/22/2019 0459   VLDL 68 (H) 08/22/2019 0459   LDLCALC 103 (H) 08/22/2019 0459   HgbA1c:  Lab Results  Component Value Date   HGBA1C 14.2 (H) 08/22/2019   Urine Drug Screen:     Component Value Date/Time   LABOPIA NONE DETECTED 06/20/2017 0019   COCAINSCRNUR NONE DETECTED 06/20/2017 0019   LABBENZ NONE DETECTED 06/20/2017 0019   AMPHETMU NONE DETECTED 06/20/2017  0019   THCU NONE DETECTED 06/20/2017 0019   LABBARB NONE DETECTED 06/20/2017 0019    Alcohol Level     Component Value Date/Time   ETH <10 06/19/2017 2247    IMAGING past 24 hours No results found.  PHYSICAL EXAM  Temp:  [98.7 F (37.1 C)-98.9 F (37.2 C)] 98.7 F (37.1 C) (07/13 0808) Pulse Rate:  [50-83] 65 (07/13 1126) Resp:  [17-21] 21 (07/13 1126) BP: (122-153)/(55-111) 147/74 (07/13 1126) SpO2:  [90 %-99 %] 93 % (07/13 1126)  General - Well nourished, well developed, not in acute distress.  Ophthalmologic - fundi not visualized due to noncooperation.  Cardiovascular - Regular rhythm and rate.  Mental Status -  Level of arousal and orientation to time, place, and person were intact. Language including expression, naming, repetition, comprehension was assessed and found intact.  Cranial Nerves II - XII - II - Visual field intact OU. III, IV, VI - Extraocular movements intact. V - Facial sensation intact bilaterally. VII - Facial movement intact bilaterally. VIII - Hard of hearing & vestibular intact bilaterally. X - Palate elevates symmetrically. XI - Chin turning & shoulder shrug intact bilaterally. XII - Tongue protrusion intact.  Motor Strength - The patient's strength was 4/5 BUEs and 3/5 BLEs and pronator drift was absent.  Bulk was normal and fasciculations were absent.   Motor Tone - Muscle  tone was assessed at the neck and appendages and was normal.  Reflexes - The patient's reflexes were symmetrical in all extremities and she had no pathological reflexes.  Sensory - Light touch, temperature/pinprick were assessed and were symmetrical.    Coordination - The patient had normal movements in the right FTN with no ataxia or dysmetria. However, left FTN severely ataxic.  Tremor was absent.  Gait and Station - deferred.   ASSESSMENT/PLAN Ms. Jaclyn [name] is a 84 y.o. female with history of atrial fibrillation on Eliquis, diabetes, bilateral ICA stenosis, hyperlipidemia, fibromyalgia, headaches, multiple TIAs with left-sided symptoms with no residual deficits presenting last week with Left-sided weakness, altered mental status, urinary frequency, hyperglycemia. Found to have R MCA small subcortical infarcts in setting of large vessel disease from R ICA. S/p R CEA 08/26/19. Post op w/ inhospital worsening w/ waxing/waning expressive aphasia and slurred speech on 7/11. MRI with new right hemisphere punctate infarcts.  Stroke:  R MCA small subcortical infarct in setting of severe intracranial large vessel disease, especially high grade R ICA stenosis s/p R CEA 7/9 Stroke:  Inhospital new punctate R watershed infarcts s/p R CEA, likely related to procedure.   CT head No acute abnormality. Small vessel disease. Atrophy.   MRI 7/4 Posterior R frontal corona radiata / centrum semiovale periventricular infarct   CTA head & neck 7/5 progressive chronic severe atherosclerosis (new severe distal L V4, new small caliber BA, string sign proximal R ICA, moderate to severe L ICA siphon, moderate to severe L ICA supraclinoid, stable proximal left ICA 70% stenosis, B P1/2, moderate R M1)  CTA head & neck 7/12 s/p R CEA patent. L subclavian w/ high-grade stenosis. O/w stable atherosclerosis as on 7/5 including left ICA 70% stenosis in the neck. New small to moderate B layering pleural effusions.    MRI 7/12 new small acute/subacute R pre & postcentral gyri infarcts.  New punctate R frontoparietal periventricular infarct. Stable R frontal periventricular white matter infarct  2D Echo EF 65-70 %. No source of embolus   EEG normal  LDL 103  HgbA1c 14.2  Eliquis for  VTE prophylaxis  Eliquis (apixaban) daily prior to admission, now on aspirin 81 mg daily and Eliquis (apixaban) daily. Continue on discharge.  Therapy recommendations:  CIR  Disposition:  pending   Left brain TIAs  7/11 overnight episode of expressive aphasia - resolved  7/13 morning episode of lethargy and aphasia while sitting and BP 120s - resolved with BP up to 140s  MRI 7/12 no acute infarct at left hemisphere  CUS 7/6 - right 80-99% and left 40-59% stenosis  CTA 7/5 and 7/12 left ICA in the neck 70% stenosis, stable. Moderate to severe stenosis at left supraclinoid and siphon.   EEG pending  [facility] Dr. Donnetta Hutching does not feel emergent left CEA needed at this time  Orthostatic vital neg 7/12  On ASA and eliquis, continue  Recommend increase midodrine dose and try to maintain BP goal 140-160. Increase IVF if needed.  Avoid long duration of sitting or standing  Atrial Fibrillation  Home anticoagulation:  Eliquis (apixaban) daily, continued in the hospital . Continue Eliquis (apixaban) daily at discharge   Carotid Stenosis   CTA neck R ICA string sign, L ICA 70%  S/p R CEA 08/26/2019  CTA 7/5 and 7/12 also showed moderate to severe stenosis at left supraclinoid and siphon.   Follow up with [facility] as outpt  Hx of stroke  06/2017 admitted for slurry speech and left arm numbness. CTA head and neck right ICA 70% and left ICA 50-70% stenosis, left ICA siphon severe stenosis, b/l M1 stenosis. LDL 79 and A1C 8.8. recommend right CEA. Pt followed with [facility] as outpt, refused procedure at that time.  Hypotension Hx Hypertension  Low BP post R CEA  Now on bedrest, head of bed flat  Avoid hypotension,  currently BP 110s  Orthostatic vital unremarkable 7/12  . Long-term BP goal 140-160 give severe intracranial and extracranial atherosclerosis . recommend increase midodrine dose and try to maintain BP goal  . Increase IVF if needed.  Hyperlipidemia  Home meds:  No statin  Now on lipitor 40  LDL 103, goal < 70  Continue statin at discharge  Diabetes type II Uncontrolled  HgbA1c 14.2, goal < 7.0  Hyperglycemia  SSI  CBG monitoring  DM education  Close PCP follow up for better DM control  Other Stroke Risk Factors  Advanced age  Family hx stroke (father)  Other Active Problems  Baseline mild dementia  Anxiety, depression  Hypothyroid   Frequent UTI  Hospital day # 8  Patient had episode of neuro worsening within the last 24 hours, has developed again acute onset AMS and aphasia, resolved after bedrest and IVF, continues to have left UE ataxia, and tandem stenosis of left ICA intra and extracranially, and I ordered bed rest and head of bed flat, IVF and came to bedside for emergent evaluation. I discussed with Dr. Loleta Books. I spent  40 minutes in total face-to-face time with the patient, more than 50% of which was spent in counseling and coordination of care, reviewing test results, images and medication, and discussing the diagnosis, treatment plan and potential prognosis. This patient's care requiresreview of multiple databases, neurological assessment, discussion with family, other specialists and medical decision making of high complexity. I had long discussion with son at bedside, updated pt current condition, treatment plan and potential prognosis, and answered all the questions. He expressed understanding and appreciation.    Rosalin Hawking, MD PhD Stroke Neurology 08/30/2019 11:52 AM   To contact Stroke Continuity provider, please refer to [email or web address] After  hours, contact General Neurology

## 2019-08-30 NOTE — H&P (Addendum)
Physical Medicine and Rehabilitation Admission H&P    Chief Complaint  Patient presents with  . Urinary Frequency  . Hyperglycemia  : HPI: Jaclyn [name] is an 84 year old right-handed female with history of atrial fibrillation maintained on Eliquis, bilateral ICA stenosis, recurrent UTIs, fibromyalgia, diabetes mellitus, hyperlipidemia, multiple TIAs without residual weakness. History taken from chart review and sign due to [facility] and cognition. Patient lives with son.  1 level home.  Son works during the day.  Independent with assistive device.  Cares for herself needing only limited assistance for dressing at times.  She does not drive. She presented on 08/21/19 with left hemiparesis and AMS. She was found down by her son.  Cranial CT scan unremarkable for acute intracranial process.  Patient did not receive TPA.  MRI of the brain showed a 6 mm acute ischemic nonhemorrhagic periventricular white matter infarction involving the posterior right frontal corona radiata centrum semiovale.  CT angiogram of head and neck negative for large vessel occlusion.  Echocardiogram with EF of 70%. No wall motion abnormalities.  EEG x2 negative.  Admission chemistry sodium 132, ammonia level 26, glucose 360, hemoglobin 11.4 urinalysis negative nitrite hemoglobin A1c of 14.2.  Hospital course further complicated by right carotid stenosis and patient underwent right carotid enterectomy with Dacron patch angioplasty on 08/26/19 per Dr. Donnetta Hutching complicated by postoperative hypotension requiring dopamine for 24 hours.  Neurology follow-up and patient was cleared to resume her Eliquis as prior to admission as well as the addition of low-dose aspirin.  Tolerating a regular diet.  Therapy evaluations completed with recommendations of physical medicine rehab consult.  Please see preadmission assessment from earlier today as well.   Review of Systems  Constitutional: Negative for chills, fever and malaise/fatigue.  HENT:  Positive for hearing loss.   Eyes: Negative for blurred vision and double vision.  Respiratory: Negative for cough and shortness of breath.   Gastrointestinal: Positive for constipation. Negative for heartburn, nausea and vomiting.  Genitourinary: Positive for urgency. Negative for dysuria, flank pain and hematuria.  Musculoskeletal: Positive for back pain, joint pain and myalgias.  Skin: Negative for rash.  Neurological: Positive for headaches. Negative for weakness.  Psychiatric/Behavioral: The patient has insomnia.   All other systems reviewed and are negative.  Past Medical History:  Diagnosis Date  . Atrial fibrillation ([place])   . Benign neoplasm of colon   . Carotid artery occlusion    60-79% right ICA stenosis  . Carotid bruit   . Cerebrovascular disease, unspecified   . Difficult intubation 2008   During surgery to remove large polyp  . Diverticulosis of colon (without mention of hemorrhage)   . Dysthymic disorder   . Fibromyalgia   . Headache(784.0)   . Insomnia, unspecified   . Interstitial cystitis   . Myalgia and myositis, unspecified   . Osteoarthrosis, unspecified whether generalized or localized, unspecified site   . Other and unspecified hyperlipidemia   . Other chest pain   . Other specified benign mammary dysplasias   . TIA (transient ischemic attack)   . Type II or unspecified type diabetes mellitus without mention of complication, not stated as uncontrolled   . Unspecified essential hypertension   . Unspecified hypothyroidism   . Unspecified vitamin D deficiency   . Urinary tract infection, site not specified    Past Surgical History:  Procedure Laterality Date  . ABDOMINAL HYSTERECTOMY  1988   cervical dysplasia  . CARDIAC CATHETERIZATION  02/19/06   EF 60%  . COLONOSCOPY    .  ENDARTERECTOMY Right 08/26/2019   Procedure: RIGHT CAROTID ENDARTERECTOMY;  Surgeon: Rosetta Posner, MD;  Location: [place];  Service: Vascular;  Laterality: Right;  . PATCH  ANGIOPLASTY Right 08/26/2019   Procedure: PATCH ANGIOPLASTY OF RIGHT COMMON CAROTID ARTERY USING HEMASHIELD PLATINUM FINESSE PATCH;  Surgeon: Rosetta Posner, MD;  Location: [place];  Service: Vascular;  Laterality: Right;  . RIGHT COLECTOMY  2005   for villous adenoma of the cecum Dr.streck  . VESICOVAGINAL FISTULA CLOSURE W/ TAH  1990   w/ cystocele &retocele repairs Dr. Ree Edman   Family History  Problem Relation Age of Onset  . Lymphoma Father   . Hypertension Father   . Stroke Father   . Hyperlipidemia Father   . Hypertension Mother   . Allergies Brother   . Hypertension Sister        3  . Breast cancer Sister   . Fibromyalgia Sister        3  . Hyperlipidemia Sister        3   Social History:  reports that she has never smoked. She has never used smokeless tobacco. She reports that she does not drink alcohol and does not use drugs. Allergies:  Allergies  Allergen Reactions  . Naproxen Sodium Other (See Comments)    Fever/aches and pains  . Statins Other (See Comments)    myalgias  . Sulfonamide Derivatives Hives and Itching  . Latex Itching and Rash  . Shellfish Allergy Itching, Swelling and Rash    Seafood, shrimp   Medications Prior to Admission  Medication Sig Dispense Refill  . apixaban (ELIQUIS) 5 MG TABS tablet Take 1 tablet (5 mg total) by mouth 2 (two) times daily. 180 tablet 1  . Insulin Glargine (LANTUS SOLOSTAR) 100 UNIT/ML Solostar Pen Inject 65 Units into the skin daily. 15 mL 11  . levothyroxine (SYNTHROID) 112 MCG tablet Take 1 tablet (112 mcg total) by mouth daily. 90 tablet 3  . sitaGLIPtin (JANUVIA) 100 MG tablet Take 1 tablet (100 mg total) by mouth daily. 30 tablet 11  . trimethoprim (TRIMPEX) 100 MG tablet Take 1 tablet (100 mg total) by mouth daily. 90 tablet 3  . B-D ULTRAFINE III SHORT PEN 31G X 8 MM MISC USE TO INJECT INSULIN ONCE DAILY. DX: E11.65 100 each 3  . DULoxetine (CYMBALTA) 30 MG capsule Take 2 capsules (60 mg total) by mouth daily.  (Patient not taking: Reported on 08/21/2019) 180 capsule 1  . vitamin B-12 (CYANOCOBALAMIN) 1000 MCG tablet Take 1 tablet (1,000 mcg total) by mouth daily. 30 tablet 11  . Vitamin D, Ergocalciferol, (DRISDOL) 1.25 MG (50000 UT) CAPS capsule Take 1 capsule (50,000 Units total) by mouth every 7 (seven) days. 12 capsule 0    Drug Regimen Review Drug regimen was reviewed and remains appropriate with no significant issues identified.  Home: Home Living Family/patient expects to be discharged to:: Private residence Living Arrangements:  (son and daughter) Available Help at Discharge: Family, Available 24 hours/day (son, daughter and granddaughter) Type of Home: House Home Access: Level entry Home Layout: One level Bathroom Shower/Tub: Tub/shower unit, Architectural technologist: Standard Bathroom Accessibility: Yes Home Equipment: Cane - single point, Grab bars - tub/shower, Bedside commode Additional Comments: worked for [employer] long for a time  Lives With: Family (son and daughter)   Functional History: Prior Function Level of Independence: Independent with assistive device(s) ADL's / Homemaking Assistance Needed: son reports that he stands in the bathroom while she showers to make sure  she is okay but she does all bathing without his physical (A)  Comments: reports continous UTIs and increased balance deficits during the UTIs  Functional Status:  Mobility: Bed Mobility Overal bed mobility: Needs Assistance Bed Mobility: Supine to Sit Rolling: Min assist Supine to sit: Min assist, HOB elevated Sit to supine: Min assist, HOB elevated Sit to sidelying: Min assist General bed mobility comments: min assist for supine<>sit for trunk and LE management, pt able to scoot to and from EOB with increased time. Transfers Overall transfer level: Needs assistance Equipment used: 1 person hand held assist, Rolling walker (2 wheeled) Transfers: Sit to/from Stand Sit to Stand: Min assist Stand pivot  transfers: Min assist General transfer comment: STS x3, first attempt from EOB with HHA and min assist to steady. Pt abruptly sat, reaching for lipstick on bed, so stood at EOB x2. 3rd stand from chair with use of RW, still min assist to steady and pt with improper hand placement when rising. Ambulation/Gait Ambulation/Gait assistance: Min assist, Mod assist Gait Distance (Feet): 160 Feet Assistive device: Rolling walker (2 wheeled) Gait Pattern/deviations: Step-through pattern, Decreased stride length, Drifts right/left, Narrow base of support, Staggering left General Gait Details: Min assist to steady, guide pt and RW. Pt bumping into objects on L multiple times, requires significant PT cuing to avoid bumping into the wall and objects on L. Additional verbal cuing for placement in RW, upright posture. Gait velocity: decr Gait velocity interpretation: <1.8 ft/sec, indicate of risk for recurrent falls    ADL: ADL Overall ADL's : Needs assistance/impaired Eating/Feeding: Set up, Sitting Eating/Feeding Details (indicate cue type and reason): pt with recent partial denture fallign out of mouth. son reports "we have them at home but she doesnt have a dentist any more" ( 1 week ago fell out eating. Pt eating all areas of tray this session. pt expressed strong love of pinto beans Grooming: Minimal assistance, Wash/dry hands, Wash/dry face, Oral care Grooming Details (indicate cue type and reason): Patient required repeat vc's for sequencing of oral hygiene standing at sink level. Increased difficulty with locating ADL items in central vision.  Lower Body Dressing: Minimal assistance Lower Body Dressing Details (indicate cue type and reason): max verbal cues to adjust socks, but able to complete when hand initially guided to socks when sitting in chair (feet were elevated in recliner at time) Toilet Transfer: Minimal assistance, Grab bars, Regular Toilet, Cueing for safety, Cueing for  sequencing Toilet Transfer Details (indicate cue type and reason): heavy reliance on grab bar with R UE. pt needs (A) To power up into standing, needed increased A sequencing movements to position over commode appropriately Toileting- Clothing Manipulation and Hygiene: Moderate assistance, Cueing for compensatory techniques, Cueing for safety, Cueing for sequencing, Maximal assistance Toileting - Clothing Manipulation Details (indicate cue type and reason): pt able to void in session, however unable to complete clothing management and peri-care with mod-max A to sequence. once instructed to complete in standing, pt able to complete, however required mod A from therapist to ensure balance Functional mobility during ADLs: Minimal assistance, Moderate assistance General ADL Comments: Functional mobility with use of RW and Min A for walker management, sequencing, and safety.   Cognition: Cognition Overall Cognitive Status: Impaired/Different from baseline Arousal/Alertness: Awake/alert Orientation Level: Oriented X4 Attention: Focused, Sustained Focused Attention: Impaired Focused Attention Impairment: Verbal basic, Functional basic Sustained Attention: Impaired Sustained Attention Impairment: Verbal basic, Functional basic Memory: Impaired Memory Impairment: Storage deficit, Retrieval deficit, Decreased recall of new information, Decreased  short term memory Decreased Short Term Memory: Verbal basic, Functional basic Awareness: Impaired Awareness Impairment: Intellectual impairment Problem Solving: Impaired Problem Solving Impairment: Verbal basic, Functional basic Executive Function: Reasoning, Decision Making Reasoning: Impaired Reasoning Impairment: Verbal basic, Functional basic Decision Making: Impaired Decision Making Impairment: Verbal basic, Functional basic Behaviors: Restless, Impulsive, Verbal agitation, Perseveration Safety/Judgment: Impaired Cognition Arousal/Alertness:  Awake/alert Behavior During Therapy: Impulsive, WFL for tasks assessed/performed Overall Cognitive Status: Impaired/Different from baseline Area of Impairment: Attention, Awareness, Problem solving, Safety/judgement, Following commands, Memory Orientation Level: Time, Disoriented to Current Attention Level: Sustained Memory: Decreased short-term memory Following Commands: Follows one step commands inconsistently, Follows one step commands with increased time Safety/Judgement: Decreased awareness of deficits, Decreased awareness of safety Awareness: Emergent Problem Solving: Slow processing, Difficulty sequencing, Requires verbal cues, Requires tactile cues General Comments: Pt attempts to mobilize OOB at EOB x2 without PT assist, pt instructed to wait. L inattention noted in pt bumping and steering towards L side of hallway multiple times, lacks insight into this.  Physical Exam: Blood pressure 137/70, pulse 78, temperature 98.2 F (36.8 C), temperature source Axillary, resp. rate 17, height 5\' 2"  (1.575 m), weight 66 kg, SpO2 98 %. Physical Exam Vitals reviewed.  Constitutional:      General: She is not in acute distress. HENT:     Head: Normocephalic and atraumatic.     Comments: Poor dentition    Right Ear: External ear normal.     Left Ear: External ear normal.     Nose: Nose normal.  Eyes:     General:        Right eye: No discharge.        Left eye: No discharge.     Extraocular Movements: Extraocular movements intact.  Pulmonary:     Effort: Pulmonary effort is normal. No respiratory distress.     Breath sounds: No stridor.  Abdominal:     General: Abdomen is flat. There is no distension.     Palpations: Abdomen is soft.  Musculoskeletal:     Cervical back: Normal range of motion and neck supple.     Comments: No edema or tenderness in extremities  Skin:    General: Skin is warm and dry.  Neurological:     Mental Status: She is alert.     Comments: Patient is alert  in no acute distress.   Severe HOH Follows simple commands.   Provides her name and age. Motor: Grossly 5/5 throughout  Psychiatric:        Speech: Speech is tangential.     Comments: Confused     Results for orders placed or performed during the hospital encounter of 08/21/19 (from the past 48 hour(s))  Glucose, capillary     Status: Abnormal   Collection Time: 08/29/19  5:11 PM  Result Value Ref Range   Glucose-Capillary 184 (H) 70 - 99 mg/dL    Comment: Glucose reference range applies only to samples taken after fasting for at least 8 hours.  Glucose, capillary     Status: Abnormal   Collection Time: 08/29/19  9:13 PM  Result Value Ref Range   Glucose-Capillary 237 (H) 70 - 99 mg/dL    Comment: Glucose reference range applies only to samples taken after fasting for at least 8 hours.  Glucose, capillary     Status: Abnormal   Collection Time: 08/30/19 12:45 PM  Result Value Ref Range   Glucose-Capillary 120 (H) 70 - 99 mg/dL    Comment: Glucose reference range applies  only to samples taken after fasting for at least 8 hours.  Glucose, capillary     Status: Abnormal   Collection Time: 08/30/19  5:16 PM  Result Value Ref Range   Glucose-Capillary 244 (H) 70 - 99 mg/dL    Comment: Glucose reference range applies only to samples taken after fasting for at least 8 hours.  Glucose, capillary     Status: Abnormal   Collection Time: 08/30/19  9:43 PM  Result Value Ref Range   Glucose-Capillary 194 (H) 70 - 99 mg/dL    Comment: Glucose reference range applies only to samples taken after fasting for at least 8 hours.  CBC     Status: Abnormal   Collection Time: 08/31/19  3:52 AM  Result Value Ref Range   WBC 6.4 4.0 - 10.5 K/uL   RBC 3.10 (L) 3.87 - 5.11 MIL/uL   Hemoglobin 9.1 (L) 12.0 - 15.0 g/dL   HCT 28.3 (L) 36 - 46 %   MCV 91.3 80.0 - 100.0 fL   MCH 29.4 26.0 - 34.0 pg   MCHC 32.2 30.0 - 36.0 g/dL   RDW 15.0 11.5 - 15.5 %   Platelets 207 150 - 400 K/uL   nRBC 0.0 0.0 -  0.2 %    Comment: Performed at [facility], [place] [address], [place], [place] [address]  Basic metabolic panel     Status: Abnormal   Collection Time: 08/31/19  3:52 AM  Result Value Ref Range   Sodium 138 135 - 145 mmol/L   Potassium 3.2 (L) 3.5 - 5.1 mmol/L   Chloride 106 98 - 111 mmol/L   CO2 21 (L) 22 - 32 mmol/L   Glucose, Bld 173 (H) 70 - 99 mg/dL    Comment: Glucose reference range applies only to samples taken after fasting for at least 8 hours.   BUN 5 (L) 8 - 23 mg/dL   Creatinine, Ser 0.61 0.44 - 1.00 mg/dL   Calcium 8.1 (L) 8.9 - 10.3 mg/dL   GFR calc non Af Amer >60 >60 mL/min   GFR calc Af Amer >60 >60 mL/min   Anion gap 11 5 - 15    Comment: Performed at [facility] [address] [place], [place] [address]  Glucose, capillary     Status: None   Collection Time: 08/31/19  6:23 AM  Result Value Ref Range   Glucose-Capillary 98 70 - 99 mg/dL    Comment: Glucose reference range applies only to samples taken after fasting for at least 8 hours.  Glucose, capillary     Status: Abnormal   Collection Time: 08/31/19 11:44 AM  Result Value Ref Range   Glucose-Capillary 155 (H) 70 - 99 mg/dL    Comment: Glucose reference range applies only to samples taken after fasting for at least 8 hours.   Comment 1 Notify RN    EEG adult  Result Date: 08/30/2019 [name], MD     08/30/2019  4:31 PM Patient Name: Erina Hamme MRN: [id number] Epilepsy Attending: [name] Referring Physician/Provider: Dr Rosalin Hawking Date: 08/30/2019 Duration: 24.27 mins Patient history: 84yo F with acute right hemispheric strokes and episodes of transient aphasia. EEG to evaluate for seizure Level of alertness: Awake, asleep AEDs during EEG study: None Technical aspects: This EEG study was done with scalp electrodes positioned according to the 10-20 International system of electrode placement. Electrical activity was acquired at a sampling rate of 500Hz  and reviewed with a high  frequency filter of 70Hz  and a low frequency filter of 1Hz . EEG data were recorded continuously and digitally stored. Description: The posterior dominant rhythm consists of 7.5-8 Hz activity of moderate voltage (25-35 uV) seen predominantly in posterior head regions, symmetric and reactive to eye opening and eye closing. Sleep was characterized by vertex waves, sleep spindles (12 to 14 Hz), maximal frontocentral region. Hyperventilation and photic stimulation were not performed.   IMPRESSION: This study is within normal limits. No seizures or epileptiform discharges were seen throughout the recording. [name]       Medical Problem List and Plan: 1.  Left side weakness secondary to right MCA small subcortical infarct in the setting of severe intracranial large vessel disease with high-grade right ICA stenosis.  Status post right CEA  -patient may shower if incision covered  -ELOS/Goals: 8-12 days/Supervision  Admit to CIR 2.  Antithrombotics: -DVT/anticoagulation: Eliquis  -antiplatelet therapy: Aspirin 81 mg daily 3. Pain Management: Oxycodone as needed 4. Mood: Cymbalta 60 mg daily  -antipsychotic agents: N/A 5. Neuropsych: This patient is ?fully capable of making decisions on her own behalf. 6. Skin/Wound Care: Routine skin checks 7. Fluids/Electrolytes/Nutrition: Routine in and outs. CMP ordered 8.  Orthostasis after CEA.  ProAmatine 2.5 mg 3 times daily.  Monitor with increased activity 9.  Diabetes mellitus.  Hemoglobin A1c 14.2.  NovoLog 6 units 3 times daily, Levemir 40 units twice daily, Tradjenta 5 mg daily.  Provide diabetic teaching  Monitor with increased mobility 10.  Hypothyroidism. Continue Synthroid  11.  Hyperlipidemia.  Lipitor 12.  Recurrent UTIs. Tripmex 100 mg daily  Cathlyn Parsons, PA-C 08/31/2019  I have personally performed a face to face diagnostic evaluation, including, but not limited to relevant history and physical exam findings, of this patient  and developed relevant assessment and plan.  Additionally, I have reviewed and concur with the physician assistant's documentation above.  Delice Lesch, MD, ABPMR

## 2019-08-30 NOTE — Progress Notes (Signed)
Occupational Therapy Treatment Patient Details Name: Jaclyn [name] MRN: [id number] DOB: 13-Aug-1935 Today's Date: 08/30/2019    History of present illness Patient is a 84 y/o female who presents with AMS and LUE weakness s/p fall at home. Found to have urinary frequency and hyperglycemia. 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 7/9 Rt CEA. On 7/12 pt developed aphasia. Repeat MRI (+) punctate acute/early   OT comments  Patient met seated EOB with urine soaked sock, call bell on floor, and lines/leads tangled. Patient in agreement with OT treatment session with focus on self-care re-education, functional transfers, and mobility with use of RW. Patient able to doff/don footwear with Mod A utilizing figure-4 position. Patient demonstrates sit to stand transfers with Min A and stand-pivot transfers with Min A and use of RW with cues for hand placement, sequencing, and walker management. Patient completed oral hygiene standing at sink with Min A for placing toothpaste on toothbrush with cues for sequencing. Questionable new onset visual deficits as patient c/o blurry vision had difficulty locating ADL items on sink surface and often overshot/undershot when wetting toothbrush in prep for oral hygiene. Please refer to vision section below for more details. Patient would benefit from continued acute OT services in prep for d/c to next level of care with recommendation for CIR. Son present at bedside in agreement with d/c disposition.    Follow Up Recommendations  CIR    Equipment Recommendations  3 in 1 bedside commode    Recommendations for Other Services Rehab consult    Precautions / Restrictions Precautions Precautions: Fall Restrictions Weight Bearing Restrictions: No       Mobility Bed Mobility Overal bed mobility: Needs Assistance             General bed mobility comments:  Patient seated EOB upon entry.   Transfers Overall transfer level: Needs assistance Equipment used: Rolling walker (2 wheeled) Transfers: Sit to/from [facility] Sit to Stand: Min assist Stand pivot transfers: Min assist       General transfer comment: STS from EOB to RW with Min A and SPT to sink surface with Min A and cues for sequencing and walker management.     Balance Overall balance assessment: Needs assistance;History of Falls Sitting-balance support: No upper extremity supported;Feet supported Sitting balance-Leahy Scale: Fair     Standing balance support: Bilateral upper extremity supported;During functional activity Standing balance-Leahy Scale: Poor Standing balance comment: Heavy reliance on BUE on RW and external assist for dynamic standing at sink level during oral hygiene.                            ADL either performed or assessed with clinical judgement   ADL Overall ADL's : Needs assistance/impaired Eating/Feeding: Set up;Sitting   Grooming: Minimal assistance;Wash/dry hands;Wash/dry face;Oral care Grooming Details (indicate cue type and reason): Patient required repeat vc's for sequencing of oral hygiene standing at sink level. Increased difficulty with locating ADL items in central vision.                              Functional mobility during ADLs: Minimal assistance;Moderate assistance General ADL Comments: Functional mobility with use of RW and Min A for walker management, sequencing, and safety.      Vision   Vision Assessment?: Vision impaired- to be further tested in functional context Additional  Comments: Patient able to see time on wall clock and date on wall calendar. Patient with increased difficulty locating ADL items on sink surface during grooming tasks.    Perception     Praxis      Cognition Arousal/Alertness: Awake/alert Behavior During Therapy: Impulsive;WFL for tasks  assessed/performed Overall Cognitive Status: Impaired/Different from baseline Area of Impairment: Attention;Awareness;Problem solving;Safety/judgement;Following commands;Memory                   Current Attention Level: Sustained Memory: Decreased short-term memory Following Commands: Follows one step commands inconsistently (Possibly due to [facility]. ) Safety/Judgement: Decreased awareness of deficits;Decreased awareness of safety Awareness: Emergent Problem Solving: Slow processing;Difficulty sequencing;Requires verbal cues;Requires tactile cues General Comments: Patient required increased cueing for sequencing of grooming tasks standing at sink level.         Exercises     Shoulder Instructions       General Comments Son present at bedside throughout session.     Pertinent Vitals/ Pain       Pain Assessment: No/denies pain  Home Living                                          Prior Functioning/Environment              Frequency  Min 3X/week        Progress Toward Goals  OT Goals(current goals can now be found in the care plan section)  Progress towards OT goals: Progressing toward goals  Acute Rehab OT Goals Patient Stated Goal: To get to rehab OT Goal Formulation: With family Time For Goal Achievement: 09/06/19 Potential to Achieve Goals: Good ADL Goals Pt Will Perform Upper Body Bathing: with min guard assist;sitting Pt Will Perform Lower Body Bathing: with min guard assist;sit to/from stand Pt Will Transfer to Toilet: with min guard assist;ambulating Pt/caregiver will Perform Home Exercise Program: Increased strength;Left upper extremity;With minimal assist;With written HEP provided  Plan Discharge plan remains appropriate    Co-evaluation                 AM-PAC OT "6 Clicks" Daily Activity     Outcome Measure   Help from another person eating meals?: A Little Help from another person taking care of personal grooming?:  A Lot Help from another person toileting, which includes using toliet, bedpan, or urinal?: A Lot Help from another person bathing (including washing, rinsing, drying)?: A Lot Help from another person to put on and taking off regular upper body clothing?: A Lot Help from another person to put on and taking off regular lower body clothing?: A Lot 6 Click Score: 13    End of Session Equipment Utilized During Treatment: Gait belt;Rolling walker  OT Visit Diagnosis: Unsteadiness on feet (R26.81);Muscle weakness (generalized) (M62.81)   Activity Tolerance Patient tolerated treatment well   Patient Left in chair;with call bell/phone within reach;with chair alarm set;with family/visitor present   Nurse Communication          Time: 1638-4665 OT Time Calculation (min): 32 min  Charges: OT General Charges $OT Visit: 1 Visit OT Treatments $Self Care/Home Management : 23-37 mins   H. OTR/L Supplemental OT, Department of rehab services [phone]    R H. 08/30/2019, 11:51 AM

## 2019-08-30 NOTE — Progress Notes (Signed)
EEG complete - results pending 

## 2019-08-30 NOTE — Procedures (Signed)
Patient Name: Jaclyn [name]  MRN: [id number]  Epilepsy Attending: [name]  Referring Physician/Provider: Dr Rosalin Hawking Date: 08/30/2019 Duration: 24.27 mins  Patient history: 84yo F with acute right hemispheric strokes and episodes of transient aphasia. EEG to evaluate for seizure  Level of alertness: Awake, asleep  AEDs during EEG study: None  Technical aspects: This EEG study was done with scalp electrodes positioned according to the 10-20 International system of electrode placement. Electrical activity was acquired at a sampling rate of 500Hz  and reviewed with a high frequency filter of 70Hz  and a low frequency filter of 1Hz . EEG data were recorded continuously and digitally stored.   Description: The posterior dominant rhythm consists of 7.5-8 Hz activity of moderate voltage (25-35 uV) seen predominantly in posterior head regions, symmetric and reactive to eye opening and eye closing. Sleep was characterized by vertex waves, sleep spindles (12 to 14 Hz), maximal frontocentral region. Hyperventilation and photic stimulation were not performed.     IMPRESSION: This study is within normal limits. No seizures or epileptiform discharges were seen throughout the recording.  Davan Hark Barbra Sarks

## 2019-08-30 NOTE — Progress Notes (Signed)
PROGRESS NOTE    Jaclyn [name]  OMV:[id number] DOB: 11/19/1935 DOA: 08/21/2019 PCP: Jinny Sanders, MD      Brief Narrative:  Mrs. Rumore is a 84 y.o. F with hx A. fib on Eliquis, carotid stenosis, fibromyalgia, TIA, diabetes, hypothyroidism, HTN who presented with 2 days left-sided weakness as well as confusion.  In the ER, MRI showed acute ischemic stroke.    Admitted and completed secondary stroke work up and risk reduction.    Noted to have high grade R carotid stenosis CEA completed 7/9 complicated by postoperative hypotension requiring dopamine for 24 hours.  While waiting for CIR placement and had aphasic episode on 7/11 overnight suspected to be from low BP.  This occurred again on 7/12 while eating breakfast.    Was reviewed again with vascular surgery and Neurology, felt to be blood pressure related, and so midodrine started.  Now pending CIR placement.        Assessment & Plan:   Acute ischemic stroke Admitted and MRI showed subcentimeter right corona radiata stroke.    In terms of evaluation for secondary causes of stroke: Echocardiogram unremarkable Carotid imaging showed severe right carotid stenosis.  Vascular surgery was consulted and performed CEA as below.  -Continue atorvastatin high intensity   -Continue sapirin 81 and Eliquis     Symptomatic carotid artery disease S/p right CEA by Dr. Donnetta Hutching on 7/9  BP related Episodic aphasia, confusion  Overnight 7/11 and then again 7/13.  These appeared to be blood pressure related (BP low overnight 7/11 and episode 7/13 occurred after working with OT and sitting up for 2 hours and improved with midodrine and BP rising to 628 systolic.    EEG obtained and showed no seizure focus.  Symptomatic contralateral carotid disease was considered (patient had right side done, are her symptoms from her left sided stenosis) but MRI brain shows no new left sided findings. Agree with Vascular surgery to monitor  closely, watchfully wait on considering left CEA.  -Increase midodrine to 10 mg three times daily      Delirium superimposed on MCI versus early dementia Delirium resolved   Permanent atrial fibrillation Acquired thrombophilia -Continue Eliquis  Type 2 diabetes, insulin-dependent, no complications Glucoses mostly controlled -Continue Levemir, SS corrections -Continue mealtime insulin -Continue gliptin    Hypothyroidism -Continue levothyroxine  B12 deficiency -Continue B12   History of recurrent UTI -Continue prophylactic trimethoprim  Fibromyalgia Not on treatment  Hypertension Chart history, not on meds at home   Hypomagnesemia Repleted          Disposition: Status is: Inpatient  Remains inpatient appropriate because: she has had a stroke and has persistent neurological deficits.  Our therapists note that the patient has multiple deficits requiring combined physical, occupational and speech therapies.  She has a large degree of medical complexity (diabetes requiring insulin, Afib on ELiquis) and also requires titration of midodrine for symptomatic orthostatic hypotension        Dispo: The patient is from: Home              Anticipated d/c is to: CIR              Anticipated d/c date is: When CIR then available, likely tomorrow              Patient currently medically ready for discharge              MDM: The below labs and imaging reports reviewed and summarized above.  Medication management  as above. Anticoagulation managed.     DVT prophylaxis: Apixaban  Code Status: Full code Family Communication:  Son at the bedside    Consultants:   Neurology  Vascular surgery  Procedures:   7/4 CT head  7/4 MRI brain subcentimeter periventricular white matter infarct right frontal corona radiata  7/5 CT angiogram head and neck --neck severe atherosclerosis progressed since 2019 CTA, new severe stenosis distal left vertebral artery,  radiographic string sign stenosis of the proximal right ICA, proximal left 70% stenosis left ICA, severe bilateral PCA P1 M2 stenoses  7/7 coronary CT -- 82 percentile calcium score, aggressive risk factor reduction recommended  7/9 right CEA by Dr. Donnetta Hutching  7/12 CTA head and neck -- normal post op  7/12 MRI brain -- scattered new punctate R ICA watershed infarct  7/13 EEG -- unremarkable  Antimicrobials:      Culture data:              Subjective: Patient without fever, dysuria, cough, dyspnea, neck pain.  No neck sweling.  This morning, she worked with OT, then was sitting up for a while eating breakfast when she began to have inappropriate expressions.  She was dysfluent and aphasic. She had increased left arm ataxia and was slowed psychomotorly.  She was assessed at the bedside and her midodrine was given.  She slowly improved to her recent baseline as BP improved.      Objective: Vitals:   08/30/19 1126 08/30/19 1243 08/30/19 1718 08/30/19 1955  BP: (!) 147/74 132/73 (!) 171/85 (!) 165/69  Pulse: 65 77 78 73  Resp: (!) 21 14 (!) 27 17  Temp:  98.3 F (36.8 C) 98.8 F (37.1 C) 98.6 F (37 C)  TempSrc:  Axillary Axillary Oral  SpO2: 93% 94% 94% 96%  Weight:      Height:        Intake/Output Summary (Last 24 hours) at 08/30/2019 2011 Last data filed at 08/30/2019 0900 Gross per 24 hour  Intake 120 ml  Output --  Net 120 ml   Filed Weights   08/21/19 1857  Weight: 66 kg    Examination: General appearance: Elderly adult female, sitting in bed, eating.  Interactive, pleasant.  Extremely extremely hard of hearing.     HEENT: Partially edentulous, no oral lesions, hearing diminished.  Anicteric, conjunctival pink, lids and lashes normal. Skin:  Cardiac: RRR, no murmurs, no lower extremity edema Respiratory: Normal respiratory rate and rhythm, lungs clear without rales wheezes Abdomen:   MSK:  Neuro: Left arm ataxia, no change to fluency, tone of voice.   No focal weakness, numbness. Psych: Attention normal, but tangential, rambling, impulsive.         Data Reviewed: I have personally reviewed following labs and imaging studies:  CBC: Recent Labs  Lab 08/25/19 0424 08/26/19 0602 08/27/19 0320 08/28/19 1037 08/29/19 0205  WBC 6.4 5.7 8.3 6.6 7.0  HGB 11.3* 11.8* 9.1* 9.3* 8.8*  HCT 34.6* 36.6 28.1* 29.5* 27.7*  MCV 89.4 89.9 93.0 93.1 91.7  PLT 205 221 208 191 496   Basic Metabolic Panel: Recent Labs  Lab 08/24/19 0741 08/25/19 0424 08/27/19 0320 08/28/19 1037 08/29/19 0205  NA 140 140 142 137 139  K 4.2 3.7 3.7 3.7 3.6  CL 109 108 114* 108 111  CO2 21* 24 20* 20* 21*  GLUCOSE 318* 259* 120* 319* 172*  BUN 10 <5* 10 10 9   CREATININE 0.85 0.72 0.75 0.78 0.80  CALCIUM 8.8* 8.9 8.0* 8.1*  8.3*  MG 1.6* 2.0 1.4*  --   --   PHOS 3.2  --   --   --   --    GFR: Estimated Creatinine Clearance: 48.4 mL/min (by C-G formula based on SCr of 0.8 mg/dL). Liver Function Tests: Recent Labs  Lab 08/24/19 0741 08/25/19 0424 08/27/19 0320 08/29/19 0205  AST 21 20 17 21   ALT 17 18 13 18   ALKPHOS 63 63 46 60  BILITOT 0.6 0.6 0.6 0.7  PROT 6.0* 6.3* 5.2* 5.4*  ALBUMIN 3.0* 3.2* 2.6* 2.7*   No results for input(s): LIPASE, AMYLASE in the last 168 hours. No results for input(s): AMMONIA in the last 168 hours. Coagulation Profile: No results for input(s): INR, PROTIME in the last 168 hours. Cardiac Enzymes: No results for input(s): CKTOTAL, CKMB, CKMBINDEX, TROPONINI in the last 168 hours. BNP (last 3 results) No results for input(s): PROBNP in the last 8760 hours. HbA1C: No results for input(s): HGBA1C in the last 72 hours. CBG: Recent Labs  Lab 08/29/19 1150 08/29/19 1711 08/29/19 2113 08/30/19 1245 08/30/19 1716  GLUCAP 229* 184* 237* 120* 244*   Lipid Profile: No results for input(s): CHOL, HDL, LDLCALC, TRIG, CHOLHDL, LDLDIRECT in the last 72 hours. Thyroid Function Tests: No results for input(s): TSH,  T4TOTAL, FREET4, T3FREE, THYROIDAB in the last 72 hours. Anemia Panel: No results for input(s): VITAMINB12, FOLATE, FERRITIN, TIBC, IRON, RETICCTPCT in the last 72 hours. Urine analysis:    Component Value Date/Time   COLORURINE YELLOW 08/21/2019 2040   APPEARANCEUR CLEAR 08/21/2019 2040   LABSPEC 1.027 08/21/2019 2040   PHURINE 5.0 08/21/2019 2040   GLUCOSEU >=500 (A) 08/21/2019 2040   GLUCOSEU >=1000 (A) 03/16/2019 0915   HGBUR NEGATIVE 08/21/2019 2040   BILIRUBINUR NEGATIVE 08/21/2019 2040   BILIRUBINUR negative 12/18/2017 1613   KETONESUR 20 (A) 08/21/2019 2040   PROTEINUR NEGATIVE 08/21/2019 2040   UROBILINOGEN 0.2 03/16/2019 0915   NITRITE NEGATIVE 08/21/2019 2040   LEUKOCYTESUR NEGATIVE 08/21/2019 2040   Sepsis Labs: @LABRCNTIP (procalcitonin:4,lacticacidven:4)  ) Recent Results (from the past 240 hour(s))  Urine culture     Status: Abnormal   Collection Time: 08/21/19  8:44 PM   Specimen: Urine, Clean Catch  Result Value Ref Range Status   Specimen Description URINE, CLEAN CATCH  Final   Special Requests   Final    NONE Performed at [facility], [place] [address] [place], [place] [address]    Culture (A)  Final    30,000 COLONIES/mL MULTIPLE SPECIES PRESENT, SUGGEST RECOLLECTION   Report Status 08/23/2019 FINAL  Final  SARS Coronavirus 2 by RT PCR (hospital order, performed in [facility] hospital lab) Nasopharyngeal Nasopharyngeal Swab     Status: None   Collection Time: 08/22/19  1:04 AM   Specimen: Nasopharyngeal Swab  Result Value Ref Range Status   SARS Coronavirus 2 NEGATIVE NEGATIVE Final    Comment: (NOTE) SARS-CoV-2 target nucleic acids are NOT DETECTED.  The SARS-CoV-2 RNA is generally detectable in upper and lower respiratory specimens during the acute phase of infection. The lowest concentration of SARS-CoV-2 viral copies this assay can detect is 250 copies / mL. A negative result does not preclude SARS-CoV-2 infection and should not be used  as the sole basis for treatment or other patient management decisions.  A negative result may occur with improper specimen collection / handling, submission of specimen other than nasopharyngeal swab, presence of viral mutation(s) within the areas targeted by this assay, and inadequate number of  viral copies (<250 copies / mL). A negative result must be combined with clinical observations, patient history, and epidemiological information.  Fact Sheet for Patients:   StrictlyIdeas.no  Fact Sheet for Healthcare Providers: BankingDealers.co.za  This test is not yet approved or  cleared by the [place] FDA and has been authorized for detection and/or diagnosis of SARS-CoV-2 by FDA under an Emergency Use Authorization (EUA).  This EUA will remain in effect (meaning this test can be used) for the duration of the COVID-19 declaration under Section 564(b)(1) of the Act, 21 U.S.C. section 360bbb-3(b)(1), unless the authorization is terminated or revoked sooner.  Performed at [facility], [place] [address], [place], [place] [address]   Surgical PCR screen     Status: None   Collection Time: 08/25/19  9:47 PM   Specimen: Nasal Mucosa; Nasal Swab  Result Value Ref Range Status   MRSA, PCR NEGATIVE NEGATIVE Final   Staphylococcus aureus NEGATIVE NEGATIVE Final    Comment: (NOTE) The Xpert SA Assay (FDA approved for NASAL specimens in patients 20 years of age and older), is one component of a comprehensive surveillance program. It is not intended to diagnose infection nor to guide or monitor treatment. Performed at [facility], [place] [address], [place], [place] [address]          Radiology Studies: CT ANGIO HEAD W OR WO CONTRAST  Result Date: 08/29/2019 CLINICAL DATA:  85 year old female with neurologic deficit. Small right hemisphere white matter lacunar infarct on MRI 08/21/2019, and also recent CTA head and neck with  extensive atherosclerosis which have progressed since 2019. EXAM: CT ANGIOGRAPHY HEAD AND NECK TECHNIQUE: Multidetector CT imaging of the head and neck was performed using the standard protocol during bolus administration of intravenous contrast. Multiplanar CT image reconstructions and MIPs were obtained to evaluate the vascular anatomy. Carotid stenosis measurements (when applicable) are obtained utilizing NASCET criteria, using the distal internal carotid diameter as the denominator. CONTRAST:  67mL OMNIPAQUE IOHEXOL 350 MG/ML SOLN COMPARISON:  CTA head and neck 08/22/2019. FINDINGS: CT HEAD Brain: Calcified atherosclerosis at the skull base. Stable gray-white matter differentiation throughout the brain. Patchy and confluent bilateral white matter hypodensity has not significantly changed. No midline shift, ventriculomegaly, mass effect, evidence of mass lesion, intracranial hemorrhage or evidence of cortically based acute infarction. Calvarium and skull base: No acute osseous abnormality identified. Paranasal sinuses: Visualized paranasal sinuses and mastoids are stable and well pneumatized. Orbits: No acute orbit or scalp soft tissue finding. CTA NECK Skeleton: Stable.  No acute osseous abnormality identified. Upper chest: New small to moderate bilateral layering pleural effusions. Stable apical lung scarring. No superior mediastinal lymphadenopathy. Visible central pulmonary arteries appear patent. Other neck: New postoperative changes to the right lower neck, see right carotid findings below. Trace postoperative gas in the right neck. No rim enhancing or drainable fluid collection. Aortic arch: Stable 3 vessel arch configuration an arch atherosclerosis. Right carotid system: Stable brachiocephalic artery plaque without stenosis. Mild right CCA plaque without stenosis proximal to the bifurcation. New right carotid endarterectomy since 08/22/2019, with widely patent right carotid bifurcation and cervical right  ICA now to the skull base. Left carotid system: Stable since 08/22/2019. Complex plaque at the proximal left ICA with an estimated 70 % stenosis with respect to the distal vessel (series 9, image 105). Stable tortuosity of the left ICA just distal to the bulb. Vertebral arteries: Stable on the right with no hemodynamically significant stenosis, dominant right vertebral artery. But more  apparent on these images today is high-grade stenosis of the proximal left subclavian artery due to bulky calcified plaque approaching a radiographic string sign on series 9, image 154. Stable calcified plaque at the non dominant left vertebral artery origin with only mild stenosis. Stable non dominant left vertebral artery to the skull base. CTA HEAD Posterior circulation: Dominant right vertebral artery supplies the basilar as before with intermittent mild plaque and no significant stenosis. Patent right PICA origin. Tandem severe stenoses of the distal left V4 segment are unchanged. Stable irregular basilar artery without high-grade stenosis. Stable PCA and SCA origins with severe bilateral P1/P2 stenoses. Bilateral PCA branches appears stable. Anterior circulation: Both ICA siphons remain patent and heavily calcified. Moderate to severe bilateral siphon stenosis appears stable, including in the supraclinoid segment on series 13, image 98. Stable carotid termini, MCA and ACA origins. ACAs remain normal aside from mild irregularity. Mild to moderate bilateral MCA M1 stenoses are stable. Additional bilateral MCA branch irregularity again noted and stable from the recent CTA. Venous sinuses: Early contrast timing, not well evaluated today. Anatomic variants: Unchanged. Review of the MIP images confirms the above findings IMPRESSION: 1. Right Carotid Endarterectomy since the recent CTA on 08/22/2019 with no adverse features. 2. Also, high-grade stenosis of the proximal Left Subclavian Artery is more apparent today (series 9, image  154), approaching a Radiographic-String-Sign. The non dominant left vertebral artery is stable. 3. Otherwise stable extensive atherosclerosis and stenosis in the head and neck as described on 08/22/2019, most notable for - Severe stenosis of the Left vertebral Artery distal V4 segment. - Left ICA 70% stenosis in the neck. - Moderate to severe Bilateral ICA siphon stenosis. - Severe bilateral PCA P1/P2 stenoses. - Moderate Right MCA distal M1 stenosis. 4. Continued stable non contrast CT appearance of the brain. No new intracranial abnormality. 5. New small to moderate bilateral layering pleural effusions. Electronically Signed   By: Genevie Ann M.D.   On: 08/29/2019 03:32   CT ANGIO NECK W OR WO CONTRAST  Result Date: 08/29/2019 CLINICAL DATA:  84 year old female with neurologic deficit. Small right hemisphere white matter lacunar infarct on MRI 08/21/2019, and also recent CTA head and neck with extensive atherosclerosis which have progressed since 2019. EXAM: CT ANGIOGRAPHY HEAD AND NECK TECHNIQUE: Multidetector CT imaging of the head and neck was performed using the standard protocol during bolus administration of intravenous contrast. Multiplanar CT image reconstructions and MIPs were obtained to evaluate the vascular anatomy. Carotid stenosis measurements (when applicable) are obtained utilizing NASCET criteria, using the distal internal carotid diameter as the denominator. CONTRAST:  39mL OMNIPAQUE IOHEXOL 350 MG/ML SOLN COMPARISON:  CTA head and neck 08/22/2019. FINDINGS: CT HEAD Brain: Calcified atherosclerosis at the skull base. Stable gray-white matter differentiation throughout the brain. Patchy and confluent bilateral white matter hypodensity has not significantly changed. No midline shift, ventriculomegaly, mass effect, evidence of mass lesion, intracranial hemorrhage or evidence of cortically based acute infarction. Calvarium and skull base: No acute osseous abnormality identified. Paranasal sinuses:  Visualized paranasal sinuses and mastoids are stable and well pneumatized. Orbits: No acute orbit or scalp soft tissue finding. CTA NECK Skeleton: Stable.  No acute osseous abnormality identified. Upper chest: New small to moderate bilateral layering pleural effusions. Stable apical lung scarring. No superior mediastinal lymphadenopathy. Visible central pulmonary arteries appear patent. Other neck: New postoperative changes to the right lower neck, see right carotid findings below. Trace postoperative gas in the right neck. No rim enhancing or drainable fluid collection. Aortic  arch: Stable 3 vessel arch configuration an arch atherosclerosis. Right carotid system: Stable brachiocephalic artery plaque without stenosis. Mild right CCA plaque without stenosis proximal to the bifurcation. New right carotid endarterectomy since 08/22/2019, with widely patent right carotid bifurcation and cervical right ICA now to the skull base. Left carotid system: Stable since 08/22/2019. Complex plaque at the proximal left ICA with an estimated 70 % stenosis with respect to the distal vessel (series 9, image 105). Stable tortuosity of the left ICA just distal to the bulb. Vertebral arteries: Stable on the right with no hemodynamically significant stenosis, dominant right vertebral artery. But more apparent on these images today is high-grade stenosis of the proximal left subclavian artery due to bulky calcified plaque approaching a radiographic string sign on series 9, image 154. Stable calcified plaque at the non dominant left vertebral artery origin with only mild stenosis. Stable non dominant left vertebral artery to the skull base. CTA HEAD Posterior circulation: Dominant right vertebral artery supplies the basilar as before with intermittent mild plaque and no significant stenosis. Patent right PICA origin. Tandem severe stenoses of the distal left V4 segment are unchanged. Stable irregular basilar artery without high-grade  stenosis. Stable PCA and SCA origins with severe bilateral P1/P2 stenoses. Bilateral PCA branches appears stable. Anterior circulation: Both ICA siphons remain patent and heavily calcified. Moderate to severe bilateral siphon stenosis appears stable, including in the supraclinoid segment on series 13, image 98. Stable carotid termini, MCA and ACA origins. ACAs remain normal aside from mild irregularity. Mild to moderate bilateral MCA M1 stenoses are stable. Additional bilateral MCA branch irregularity again noted and stable from the recent CTA. Venous sinuses: Early contrast timing, not well evaluated today. Anatomic variants: Unchanged. Review of the MIP images confirms the above findings IMPRESSION: 1. Right Carotid Endarterectomy since the recent CTA on 08/22/2019 with no adverse features. 2. Also, high-grade stenosis of the proximal Left Subclavian Artery is more apparent today (series 9, image 154), approaching a Radiographic-String-Sign. The non dominant left vertebral artery is stable. 3. Otherwise stable extensive atherosclerosis and stenosis in the head and neck as described on 08/22/2019, most notable for - Severe stenosis of the Left vertebral Artery distal V4 segment. - Left ICA 70% stenosis in the neck. - Moderate to severe Bilateral ICA siphon stenosis. - Severe bilateral PCA P1/P2 stenoses. - Moderate Right MCA distal M1 stenosis. 4. Continued stable non contrast CT appearance of the brain. No new intracranial abnormality. 5. New small to moderate bilateral layering pleural effusions. Electronically Signed   By: Genevie Ann M.D.   On: 08/29/2019 03:32   MR BRAIN WO CONTRAST  Result Date: 08/29/2019 CLINICAL DATA:  Stroke, follow-up. EXAM: MRI HEAD WITHOUT CONTRAST TECHNIQUE: Multiplanar, multiecho pulse sequences of the brain and surrounding structures were obtained without intravenous contrast. COMPARISON:  CT angiogram head/neck 08/29/2019, CT angiogram head/neck 08/22/2019, brain MRI 08/21/2019  FINDINGS: Brain: At the provider's request, a limited protocol MRI was performed. Only axial and coronal diffusion-weighted sequences, as well as an axial T2/FLAIR sequence, were obtained. Redemonstrated is a 6 mm, now subacute, infarct within the posterior right frontal lobe periventricular white matter (series 2, image 35). However new as compared to the MRI of 08/21/2019, there are small acute/early subacute cortical infarcts within the right pre and postcentral gyri measuring up to 10 mm (series 2, image 42) (series 2, image 41). Also new from this prior examination, there is a punctate acute/early subacute infarct within the Peri ventricle right parietooccipital lobes (series 2,  image 24). Stable background moderate patchy T2/FLAIR hyperintensity within the cerebral white matter and pons which is nonspecific, but consistent with chronic small vessel ischemic disease. Redemonstrated chronic lacunar infarct within the anterior right external capsule. Stable, mild generalized parenchymal atrophy. Vascular: Flow voids poorly assessed on the acquired sequences. Skull and upper cervical spine: No focal marrow lesion is identified on the acquired sequences. Sinuses/Orbits: No acute orbital abnormality identified on the acquired sequences. Mild ethmoid sinus mucosal thickening. IMPRESSION: Limited protocol brain MRI as described. Small (measuring up to 10 mm) acute/early subacute cortical infarcts within the right pre and postcentral gyri, new as compared to prior MRI 08/21/2019. Also new from this prior exam, there is a punctate acute/early subacute infarct within the periventricular right frontoparietal lobes. Redemonstrated 6 mm, now subacute, infarct within the posterior right frontal lobe periventricular white matter. Stable background mild generalized parenchymal atrophy and moderate chronic small vessel ischemic disease. Mild ethmoid sinus mucosal thickening. Electronically Signed   By: Kellie Simmering DO   On:  08/29/2019 09:14   EEG adult  Result Date: 08/30/2019 [name], MD     08/30/2019  4:31 PM Patient Name: Jaclyn [name] MRN: [id number] Epilepsy Attending: [name] Referring Physician/Provider: Dr Rosalin Hawking Date: 08/30/2019 Duration: 24.27 mins Patient history: 84yo F with acute right hemispheric strokes and episodes of transient aphasia. EEG to evaluate for seizure Level of alertness: Awake, asleep AEDs during EEG study: None Technical aspects: This EEG study was done with scalp electrodes positioned according to the 10-20 International system of electrode placement. Electrical activity was acquired at a sampling rate of 500Hz  and reviewed with a high frequency filter of 70Hz  and a low frequency filter of 1Hz . EEG data were recorded continuously and digitally stored. Description: The posterior dominant rhythm consists of 7.5-8 Hz activity of moderate voltage (25-35 uV) seen predominantly in posterior head regions, symmetric and reactive to eye opening and eye closing. Sleep was characterized by vertex waves, sleep spindles (12 to 14 Hz), maximal frontocentral region. Hyperventilation and photic stimulation were not performed.   IMPRESSION: This study is within normal limits. No seizures or epileptiform discharges were seen throughout the recording. Priyanka Barbra Sarks        Scheduled Meds: . apixaban  5 mg Oral BID  . aspirin EC  81 mg Oral Daily  . atorvastatin  40 mg Oral Daily  . docusate sodium  100 mg Oral Daily  . DULoxetine  60 mg Oral Daily  . insulin aspart  0-15 Units Subcutaneous TID WC  . insulin aspart  0-5 Units Subcutaneous QHS  . insulin aspart  6 Units Subcutaneous TID WC  . insulin detemir  40 Units Subcutaneous BID  . levothyroxine  112 mcg Oral Daily  . linagliptin  5 mg Oral Daily  . midodrine  10 mg Oral TID WC  . mupirocin ointment  1 application Nasal BID  . pantoprazole  40 mg Oral Daily  . trimethoprim  100 mg Oral Daily  . vitamin B-12  1,000 mcg  Oral Daily   Continuous Infusions:    LOS: 8 days    Time spent: 25 minutes    Edwin Dada, MD [facility] 08/30/2019, 8:11 PM     Please page though [place] or Epic secure chat:  For [employer], Adult nurse

## 2019-08-30 NOTE — Progress Notes (Signed)
Physical Therapy Treatment Patient Details Name: Jaclyn [name] MRN: [id number] DOB: [birth year] Today's Date: 08/30/2019    History of Present Illness Patient is a 84 y/o female who presents with AMS and LUE weakness s/p fall at home. Found to have urinary frequency and hyperglycemia. 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 7/9 Rt CEA. On 7/12 pt developed aphasia. Repeat MRI (+) punctate acute/early    PT Comments    Pt sleeping upon arrival to room, but motivated to participate in therapy. Pt ambulated hallway distance with use of RW and min steadying assist, pt limited by L inattention and poor safety awareness. Pt with no s/s aphasia this session, with BP 167/65 and HR in 90s immediately post-ambulation, RN notified. PT to continue to progress mobility as able.    Follow Up Recommendations  CIR     Equipment Recommendations  Other (comment) (To be determined)    Recommendations for Other Services       Precautions / Restrictions Precautions Precautions: Fall Precaution Comments: left inattention (improving) Restrictions Weight Bearing Restrictions: No    Mobility  Bed Mobility Overal bed mobility: Needs Assistance Bed Mobility: Supine to Sit     Supine to sit: Min assist;HOB elevated Sit to supine: Min assist;HOB elevated   General bed mobility comments: min assist for supine<>sit for trunk and LE management, pt able to scoot to and from EOB with increased time.  Transfers Overall transfer level: Needs assistance Equipment used: 1 person hand held assist;Rolling walker (2 wheeled) Transfers: Sit to/from Stand Sit to Stand: Min assist         General transfer comment: STS x3, first attempt from EOB with HHA and min assist to steady. Pt abruptly sat, reaching for lipstick on bed, so stood at EOB x2. 3rd stand from chair with use of RW, still min assist to  steady and pt with improper hand placement when rising.  Ambulation/Gait Ambulation/Gait assistance: Min assist;Mod assist Gait Distance (Feet): 160 Feet Assistive device: Rolling walker (2 wheeled) Gait Pattern/deviations: Step-through pattern;Decreased stride length;Drifts right/left;Narrow base of support;Staggering left Gait velocity: decr   General Gait Details: Min assist to steady, guide pt and RW. Pt bumping into objects on L multiple times, requires significant PT cuing to avoid bumping into the wall and objects on L. Additional verbal cuing for placement in RW, upright posture.   Stairs             Wheelchair Mobility    Modified Rankin (Stroke Patients Only) Modified Rankin (Stroke Patients Only) Pre-Morbid Rankin Score: Moderate disability Modified Rankin: Moderately severe disability     Balance Overall balance assessment: Needs assistance;History of Falls Sitting-balance support: No upper extremity supported;Feet supported Sitting balance-Leahy Scale: Fair     Standing balance support: Bilateral upper extremity supported;During functional activity Standing balance-Leahy Scale: Poor Standing balance comment: able to ambulate very short distance with HHA, very unsteady and improved with use of RW.                            Cognition Arousal/Alertness: Awake/alert Behavior During Therapy: Impulsive;WFL for tasks assessed/performed Overall Cognitive Status: Impaired/Different from baseline Area of Impairment: Attention;Awareness;Problem solving;Safety/judgement;Following commands;Memory                   Current Attention Level: Sustained Memory: Decreased short-term memory Following Commands: Follows one step commands inconsistently;Follows one step commands with increased time Safety/Judgement:  Decreased awareness of deficits;Decreased awareness of safety Awareness: Emergent Problem Solving: Slow processing;Difficulty  sequencing;Requires verbal cues;Requires tactile cues General Comments: Pt attempts to mobilize OOB at EOB x2 without PT assist, pt instructed to wait. L inattention noted in pt bumping and steering towards L side of hallway multiple times, lacks insight into this.      Exercises      General Comments General comments (skin integrity, edema, etc.): Son sleeping in recliner upon PT arrival to room      Pertinent Vitals/Pain Pain Assessment: No/denies pain Pain Intervention(s): Limited activity within patient's tolerance;Monitored during session    Home Living                      Prior Function            PT Goals (current goals can now be found in the care plan section) Acute Rehab PT Goals Patient Stated Goal: To get to rehab PT Goal Formulation: With patient Time For Goal Achievement: 09/05/19 Potential to Achieve Goals: Fair Progress towards PT goals: Progressing toward goals    Frequency    Min 4X/week      PT Plan Current plan remains appropriate    Co-evaluation              AM-PAC PT "6 Clicks" Mobility   Outcome Measure  Help needed turning from your back to your side while in a flat bed without using bedrails?: A Little Help needed moving from lying on your back to sitting on the side of a flat bed without using bedrails?: A Little Help needed moving to and from a bed to a chair (including a wheelchair)?: A Little Help needed standing up from a chair using your arms (e.g., wheelchair or bedside chair)?: A Little Help needed to walk in hospital room?: A Lot Help needed climbing 3-5 steps with a railing? : A Lot 6 Click Score: 16    End of Session Equipment Utilized During Treatment: Gait belt Activity Tolerance: Patient tolerated treatment well Patient left: in chair;with call bell/phone within reach;with family/visitor present;in bed;with bed alarm set Nurse Communication: Mobility status PT Visit Diagnosis: Hemiplegia and  hemiparesis;Other abnormalities of gait and mobility (R26.89) Hemiplegia - Right/Left: Left Hemiplegia - dominant/non-dominant: Non-dominant Hemiplegia - caused by: Cerebral infarction     Time: 0076-2263 PT Time Calculation (min) (ACUTE ONLY): 25 min  Charges:  $Gait Training: 8-22 mins $Therapeutic Activity: 8-22 mins                     Chivon Lepage E, PT Acute Rehabilitation Services Pager [phone]  Office [phone]   Wende Longstreth D Cathyann Kilfoyle 08/30/2019, 4:04 PM

## 2019-08-30 NOTE — Progress Notes (Signed)
Change in status: Approximately 1030 am patient noted up in chair eating breakfast. BP 125/66 sitting.  Son stated "Good, Im glad your here.  She has started that thing again and isn't getting her words out."   She just got up with therapy and now sitting in chair to eat.  Noted A&Ox4, extremities intact.  Speech slurred with forgotten words & mixed up sentences.   Attending, Neurology, vascular aware. New orders:  Bolus 500 NS and lay flat to increase BP

## 2019-08-30 NOTE — PMR Pre-admission (Addendum)
PMR Admission Coordinator Pre-Admission Assessment  Patient: Jaclyn [name] is an 84 y.o., female MRN: [id number] DOB: 06/07/1935 Height: 5\' 2"  (157.5 cm) Weight: 66 kg              Insurance Information HMO: yes    PPO:      PCP:      IPA:      80/20:      OTHER:  PRIMARY: Blue Medicare      Policy#: [id number]      Subscriber: pt CM Name: Shanon      Phone#: [phone]     Fax#: [phone] Pre-Cert#: tbd for 7 days      Employer:  Benefits:  Phone #: [phone]     Name: 7/13 Eff. Date: 02/18/2019     Deduct: none      Out of Pocket Max: $3900      Life Max: none  CIR: $335 per day days 1 until 6      SNF: no copay days 1 until 20; $184 per day days 21 until 60; no co pays days 61 until 100 Outpatient: $40 per visit     Co-Pay: visits per medical neccesity Home Health: 100%      Co-Pay: visits per medical neccesity  DME: 80%     Co-Pay: 20% Providers: none  SECONDARY:       Policy#:       Phone#:   Development worker, community:       Phone#:   The Engineer, petroleum" for patients in Inpatient Rehabilitation Facilities with attached "Privacy Act [place] Records" was provided and verbally reviewed with: Patient and Family  Emergency Contact Information Contact Information     Name Relation Home Work [place] Son [phone]  [phone]   [name]   [phone]      Current Medical History  Patient Admitting Diagnosis: CVA  History of Present Illness:Jaclyn [name] is an 84 year old right-handed female with history of atrial fibrillation maintained on Eliquis, bilateral ICA stenosis, recurrent UTIs, fibromyalgia, diabetes mellitus, hyperlipidemia, multiple TIA's without residual weakness.  Per chart review lives with son.  1 level home.  Son works during the day.  Independent with assistive device.  Care for herself needing only limited assistance for dressing at times.  She does not drive.  Presented 08/21/2019  with left-sided weakness and altered mental status.  She was found down by her son.  Cranial CT scan negative.  Patient did not receive TPA.  MRI of the brain showed a 6 mm acute ischemic nonhemorrhagic periventricular white matter infarction involving the posterior right frontal corona radiata centrum semi ovale.  CT angiogram of head and neck negative for large vessel occlusion.  Echocardiogram with ejection fraction of 70% no wall motion abnormalities.  EEG negative.  Admission chemistry sodium 132, ammonia level 26, glucose 360, hemoglobin 11.4 urinalysis negative nitrite hemoglobin A1c of 14.2.  Hospital course follow-up vascular surgery for right carotid stenosis patient underwent right carotid enterectomy with Dacron patch angioplasty 08/26/2019 per Dr. Donnetta Hutching complicated by postoperative hypotension requiring dopamine for 24 hours.  Neurology follow-up and patient was cleared to resume her Eliquis as prior to admission as well as the addition of low-dose aspirin. Midodrine added to assist with BP management. Tolerating a regular diet.    Complete NIHSS TOTAL: 4 Glasgow Coma Scale Score: 15  Past Medical History  Past Medical History:  Diagnosis Date   Atrial fibrillation (HCC)    Benign neoplasm of colon  Carotid artery occlusion    60-79% right ICA stenosis   Carotid bruit    Cerebrovascular disease, unspecified    Difficult intubation 2008   During surgery to remove large polyp   Diverticulosis of colon (without mention of hemorrhage)    Dysthymic disorder    Fibromyalgia    Headache(784.0)    Insomnia, unspecified    Interstitial cystitis    Myalgia and myositis, unspecified    Osteoarthrosis, unspecified whether generalized or localized, unspecified site    Other and unspecified hyperlipidemia    Other chest pain    Other specified benign mammary dysplasias    TIA (transient ischemic attack)    Type II or unspecified type diabetes mellitus without mention of complication, not  stated as uncontrolled    Unspecified essential hypertension    Unspecified hypothyroidism    Unspecified vitamin D deficiency    Urinary tract infection, site not specified     Family History  family history includes Allergies in her brother; Breast cancer in her sister; Fibromyalgia in her sister; Hyperlipidemia in her father and sister; Hypertension in her father, mother, and sister; Lymphoma in her father; Stroke in her father.  Prior Rehab/Hospitalizations:  Has the patient had prior rehab or hospitalizations prior to admission? Yes  Has the patient had major surgery during 100 days prior to admission? Yes  Current Medications   Current Facility-Administered Medications:    acetaminophen (TYLENOL) tablet 650 mg, 650 mg, Oral, Q4H PRN **OR** acetaminophen (TYLENOL) 160 MG/5ML solution 650 mg, 650 mg, Per Tube, Q4H PRN **OR** acetaminophen (TYLENOL) suppository 650 mg, 650 mg, Rectal, Q4H PRN, Baglia, Corrina, PA-C   alum & mag hydroxide-simeth (MAALOX/MYLANTA) 200-200-20 MG/5ML suspension 15-30 mL, 15-30 mL, Oral, Q2H PRN, Baglia, Corrina, PA-C   apixaban (ELIQUIS) tablet 5 mg, 5 mg, Oral, BID, Danford, Suann Larry, MD, 5 mg at 08/31/19 1015   aspirin EC tablet 81 mg, 81 mg, Oral, Daily, Baglia, Corrina, PA-C, 81 mg at 08/31/19 1010   atorvastatin (LIPITOR) tablet 40 mg, 40 mg, Oral, Daily, Baglia, Corrina, PA-C, 40 mg at 08/31/19 1011   docusate sodium (COLACE) capsule 100 mg, 100 mg, Oral, Daily, Baglia, Corrina, PA-C, 100 mg at 08/31/19 1010   DULoxetine (CYMBALTA) DR capsule 60 mg, 60 mg, Oral, Daily, Danford, Suann Larry, MD, 60 mg at 08/31/19 1011   guaiFENesin-dextromethorphan (ROBITUSSIN DM) 100-10 MG/5ML syrup 15 mL, 15 mL, Oral, Q4H PRN, Baglia, Corrina, PA-C   hydrALAZINE (APRESOLINE) injection 5 mg, 5 mg, Intravenous, Q20 Min PRN, Baglia, Corrina, PA-C   insulin aspart (novoLOG) injection 0-15 Units, 0-15 Units, Subcutaneous, TID WC, Baglia, Corrina, PA-C, 5 Units at  08/30/19 1722   insulin aspart (novoLOG) injection 0-5 Units, 0-5 Units, Subcutaneous, QHS, Baglia, Corrina, PA-C, 2 Units at 08/29/19 2214   insulin aspart (novoLOG) injection 6 Units, 6 Units, Subcutaneous, TID WC, Danford, Suann Larry, MD, 6 Units at 08/30/19 1154   insulin detemir (LEVEMIR) injection 40 Units, 40 Units, Subcutaneous, BID, Danford, Suann Larry, MD, 40 Units at 08/31/19 1011   labetalol (NORMODYNE) injection 10 mg, 10 mg, Intravenous, Q10 min PRN, Baglia, Corrina, PA-C   levothyroxine (SYNTHROID) tablet 112 mcg, 112 mcg, Oral, Daily, Baglia, Corrina, PA-C, 112 mcg at 08/31/19 0652   linagliptin (TRADJENTA) tablet 5 mg, 5 mg, Oral, Daily, Baglia, Corrina, PA-C, 5 mg at 08/31/19 1010   magnesium sulfate IVPB 2 g 50 mL, 2 g, Intravenous, Daily PRN, Baglia, Corrina, PA-C   metoprolol tartrate (LOPRESSOR) injection 2-5 mg, 2-5 mg,  Intravenous, Q2H PRN, Baglia, Corrina, PA-C   midodrine (PROAMATINE) tablet 10 mg, 10 mg, Oral, TID WC, Danford, Christopher P, MD   morphine 2 MG/ML injection 2-5 mg, 2-5 mg, Intravenous, Q1H PRN, Baglia, Corrina, PA-C   ondansetron (ZOFRAN) injection 4 mg, 4 mg, Intravenous, Q6H PRN, Baglia, Corrina, PA-C   oxyCODONE (Oxy IR/ROXICODONE) immediate release tablet 5-10 mg, 5-10 mg, Oral, Q4H PRN, Baglia, Corrina, PA-C   pantoprazole (PROTONIX) EC tablet 40 mg, 40 mg, Oral, Daily, Baglia, Corrina, PA-C, 40 mg at 08/31/19 1010   phenol (CHLORASEPTIC) mouth spray 1 spray, 1 spray, Mouth/Throat, PRN, Baglia, Corrina, PA-C   potassium chloride SA (KLOR-CON) CR tablet 20-40 mEq, 20-40 mEq, Oral, Daily PRN, Baglia, Corrina, PA-C   senna-docusate (Senokot-S) tablet 2 tablet, 2 tablet, Oral, QHS PRN, Baglia, Corrina, PA-C   trimethoprim (TRIMPEX) tablet 100 mg, 100 mg, Oral, Daily, Baglia, Corrina, PA-C, 100 mg at 08/31/19 1017   vitamin B-12 (CYANOCOBALAMIN) tablet 1,000 mcg, 1,000 mcg, Oral, Daily, Baglia, Corrina, PA-C, 1,000 mcg at 08/31/19 1010  Patients  Current Diet:  Diet Order             Diet Heart Room service appropriate? Yes with Assist; Fluid consistency: Thin  Diet effective now                   Precautions / Restrictions Precautions Precautions: Fall Precaution Comments: left inattention (improving) Restrictions Weight Bearing Restrictions: No   Has the patient had 2 or more falls or a fall with injury in the past year?No  Prior Activity Level Limited Community (1-2x/wk): Mod I at home ; sleeps during the day and up at night  Prior Functional Level Prior Function Level of Independence: Independent with assistive device(s) ADL's / Homemaking Assistance Needed: son reports that he stands in the bathroom while she showers to make sure she is okay but she does all bathing without his physical (A)  Comments: reports continous UTIs and increased balance deficits during the UTIs  Self Care: Did the patient need help bathing, dressing, using the toilet or eating?  Needed some help  Indoor Mobility: Did the patient need assistance with walking from room to room (with or without device)? Independent  Stairs: Did the patient need assistance with internal or external stairs (with or without device)? Independent  Functional Cognition: Did the patient need help planning regular tasks such as shopping or remembering to take medications? Needed some help  Home Assistive Devices / Equipment Home Assistive Devices/Equipment: CBG Meter, Cane (specify quad or straight) Home Equipment: Cane - single point, Grab bars - tub/shower, Bedside commode  Prior Device Use: Indicate devices/aids used by the patient prior to current illness, exacerbation or injury? Walker  Current Functional Level Cognition  Arousal/Alertness: Awake/alert Overall Cognitive Status: Impaired/Different from baseline Current Attention Level: Sustained Orientation Level: Oriented X4 Following Commands: Follows one step commands inconsistently, Follows one  step commands with increased time Safety/Judgement: Decreased awareness of deficits, Decreased awareness of safety General Comments: Pt attempts to mobilize OOB at EOB x2 without PT assist, pt instructed to wait. L inattention noted in pt bumping and steering towards L side of hallway multiple times, lacks insight into this. Attention: Focused, Sustained Focused Attention: Impaired Focused Attention Impairment: Verbal basic, Functional basic Sustained Attention: Impaired Sustained Attention Impairment: Verbal basic, Functional basic Memory: Impaired Memory Impairment: Storage deficit, Retrieval deficit, Decreased recall of new information, Decreased short term memory Decreased Short Term Memory: Verbal basic, Functional basic Awareness: Impaired Awareness Impairment: Intellectual  impairment Problem Solving: Impaired Problem Solving Impairment: Verbal basic, Functional basic Executive Function: Reasoning, Decision Making Reasoning: Impaired Reasoning Impairment: Verbal basic, Functional basic Decision Making: Impaired Decision Making Impairment: Verbal basic, Functional basic Behaviors: Restless, Impulsive, Verbal agitation, Perseveration Safety/Judgment: Impaired    Extremity Assessment (includes Sensation/Coordination)  Upper Extremity Assessment: Generalized weakness LUE Deficits / Details: AROM appears to be [facility] LUE Sensation: decreased proprioception LUE Coordination: decreased fine motor, decreased gross motor  Lower Extremity Assessment: Defer to PT evaluation LLE Deficits / Details: Able to perform LAQ and marching with some decreased range. Difficulty lifting LLE against gravity from bed. LLE Sensation: decreased light touch, decreased proprioception    ADLs  Overall ADL's : Needs assistance/impaired Eating/Feeding: Set up, Sitting Eating/Feeding Details (indicate cue type and reason): pt with recent partial denture fallign out of mouth. son reports "we have them at home  but she doesnt have a dentist any more" ( 1 week ago fell out eating. Pt eating all areas of tray this session. pt expressed strong love of pinto beans Grooming: Minimal assistance, Wash/dry hands, Wash/dry face, Oral care Grooming Details (indicate cue type and reason): Patient required repeat vc's for sequencing of oral hygiene standing at sink level. Increased difficulty with locating ADL items in central vision.  Lower Body Dressing: Minimal assistance Lower Body Dressing Details (indicate cue type and reason): max verbal cues to adjust socks, but able to complete when hand initially guided to socks when sitting in chair (feet were elevated in recliner at time) Toilet Transfer: Minimal assistance, Grab bars, Regular Toilet, Cueing for safety, Cueing for sequencing Toilet Transfer Details (indicate cue type and reason): heavy reliance on grab bar with R UE. pt needs (A) To power up into standing, needed increased A sequencing movements to position over commode appropriately Toileting- Clothing Manipulation and Hygiene: Moderate assistance, Cueing for compensatory techniques, Cueing for safety, Cueing for sequencing, Maximal assistance Toileting - Clothing Manipulation Details (indicate cue type and reason): pt able to void in session, however unable to complete clothing management and peri-care with mod-max A to sequence. once instructed to complete in standing, pt able to complete, however required mod A from therapist to ensure balance Functional mobility during ADLs: Minimal assistance, Moderate assistance General ADL Comments: Functional mobility with use of RW and Min A for walker management, sequencing, and safety.     Mobility  Overal bed mobility: Needs Assistance Bed Mobility: Supine to Sit Rolling: Min assist Supine to sit: Min assist, HOB elevated Sit to supine: Min assist, HOB elevated Sit to sidelying: Min assist General bed mobility comments: min assist for supine<>sit for trunk  and LE management, pt able to scoot to and from EOB with increased time.    Transfers  Overall transfer level: Needs assistance Equipment used: 1 person hand held assist, Rolling walker (2 wheeled) Transfers: Sit to/from Stand Sit to Stand: Min assist Stand pivot transfers: Min assist General transfer comment: STS x3, first attempt from EOB with HHA and min assist to steady. Pt abruptly sat, reaching for lipstick on bed, so stood at EOB x2. 3rd stand from chair with use of RW, still min assist to steady and pt with improper hand placement when rising.    Ambulation / Gait / Stairs / Wheelchair Mobility  Ambulation/Gait Ambulation/Gait assistance: Min assist, Mod assist Gait Distance (Feet): 160 Feet Assistive device: Rolling walker (2 wheeled) Gait Pattern/deviations: Step-through pattern, Decreased stride length, Drifts right/left, Narrow base of support, Staggering left General Gait Details: Min assist to  steady, guide pt and RW. Pt bumping into objects on L multiple times, requires significant PT cuing to avoid bumping into the wall and objects on L. Additional verbal cuing for placement in RW, upright posture. Gait velocity: decr Gait velocity interpretation: <1.8 ft/sec, indicate of risk for recurrent falls    Posture / Balance Dynamic Sitting Balance Sitting balance - Comments: Varying assist needed, Min guard-Mod A due to poor balance reactions and LOB in all directions. Balance Overall balance assessment: Needs assistance, History of Falls Sitting-balance support: No upper extremity supported, Feet supported Sitting balance-Leahy Scale: Fair Sitting balance - Comments: Varying assist needed, Min guard-Mod A due to poor balance reactions and LOB in all directions. Standing balance support: Bilateral upper extremity supported, During functional activity Standing balance-Leahy Scale: Poor Standing balance comment: able to ambulate very short distance with HHA, very unsteady and  improved with use of RW.    Special needs/care consideration Designated visitors are son, [name], and grand daughter, NIcki Family have been staying with her 24/7 but I have requested they stay only during visiting hours on cir [place]:  (son and daughter)  Lives With: Family (son and daughter) Available Help at Discharge: Family, Available 24 hours/day (son, daughter and granddaughter) Type of Home: House Home Layout: One level Home Access: Level entry Bathroom Shower/Tub: Tub/shower unit, Architectural technologist: Standard Bathroom Accessibility: Yes How Accessible: Accessible via walker [place]: No Additional Comments: worked for [employer] long for a time  Discharge Living Setting Plans for Discharge Living Setting: Patient's home, Lives with (comment) (son and daughter) Type of Home at Discharge: House Discharge Home Layout: One level Discharge Home Access: Level entry Discharge Bathroom Shower/Tub: Tub/shower unit Discharge Bathroom Toilet: Standard Discharge Bathroom Accessibility: Yes How Accessible: Accessible via walker Does the patient have any problems obtaining your medications?: No  Social/Family/Support Systems Patient Roles: Parent Contact Information: son , [name] and granddaughter, Nicki Anticipated Caregiver: Thera Flake and Nicki Anticipated Caregiver's Contact Information: see above Ability/Limitations of Caregiver: [name] works days, Event organiser can give supervision, [name] is a Medical sales representative Availability: 24/7 Discharge Plan Discussed with Primary Caregiver: Yes Is Caregiver In Agreement with Plan?: Yes Does Caregiver/Family have Issues with Lodging/Transportation while Pt is in Rehab?: No   NIcki, grand daughter will not work her summer job if patient discharges before July 29 th. She is managing patient rehab needs and care afterwards. She is a PTA  Goals Patient/Family Goal for Rehab: min/Supervision  PT, OT, and SLP  Expected length of stay: ELOS 10 to 14 days Pt/Family Agrees to Admission and willing to participate: Yes Program Orientation Provided & Reviewed with Pt/Caregiver Including Roles  & Responsibilities: Yes  Decrease burden of Care through IP rehab admission: n/a  Possible need for SNF placement upon discharge:not anticipated  Patient Condition: This patient's medical and functional status has changed since the consult dated: 08/23/2019 in which the Rehabilitation Physician determined and documented that the patient's condition is appropriate for intensive rehabilitative care in an inpatient rehabilitation facility. See "History of Present Illness" (above) for medical update. Functional changes are: min to mod assist. Patient's medical and functional status update has been discussed with the Rehabilitation physician and patient remains appropriate for inpatient rehabilitation. Will admit to inpatient rehab today.  Preadmission Screen Completed By:  Cleatrice Burke, RN, 08/31/2019 11:39 AM ______________________________________________________________________   Discussed status with Dr. Posey Pronto on 08/31/2019 at  1140 and received approval for admission today.  Admission  Coordinator:  Cleatrice Burke, time 0757 Date 08/31/2019

## 2019-08-31 ENCOUNTER — Inpatient Hospital Stay (HOSPITAL_COMMUNITY)
Admission: RE | Admit: 2019-08-31 | Discharge: 2019-09-09 | DRG: 057 | Disposition: A | Discharge status: Home Health | Payer: Medicare Other | Source: Intra-hospital | Attending: Physical Medicine & Rehabilitation | Admitting: Physical Medicine & Rehabilitation

## 2019-08-31 ENCOUNTER — Encounter (HOSPITAL_COMMUNITY): Payer: Self-pay | Admitting: Physical Medicine & Rehabilitation

## 2019-08-31 ENCOUNTER — Other Ambulatory Visit: Payer: Self-pay

## 2019-08-31 DIAGNOSIS — E559 Vitamin D deficiency, unspecified: Secondary | ICD-10-CM | POA: Diagnosis not present

## 2019-08-31 DIAGNOSIS — I10 Essential (primary) hypertension: Secondary | ICD-10-CM | POA: Diagnosis present

## 2019-08-31 DIAGNOSIS — I4891 Unspecified atrial fibrillation: Secondary | ICD-10-CM | POA: Diagnosis present

## 2019-08-31 DIAGNOSIS — Z83438 Family history of other disorder of lipoprotein metabolism and other lipidemia: Secondary | ICD-10-CM | POA: Diagnosis not present

## 2019-08-31 DIAGNOSIS — R7309 Other abnormal glucose: Secondary | ICD-10-CM

## 2019-08-31 DIAGNOSIS — E785 Hyperlipidemia, unspecified: Secondary | ICD-10-CM | POA: Diagnosis present

## 2019-08-31 DIAGNOSIS — I69354 Hemiplegia and hemiparesis following cerebral infarction affecting left non-dominant side: Secondary | ICD-10-CM | POA: Diagnosis not present

## 2019-08-31 DIAGNOSIS — E876 Hypokalemia: Secondary | ICD-10-CM | POA: Diagnosis not present

## 2019-08-31 DIAGNOSIS — M797 Fibromyalgia: Secondary | ICD-10-CM | POA: Diagnosis present

## 2019-08-31 DIAGNOSIS — I951 Orthostatic hypotension: Secondary | ICD-10-CM | POA: Diagnosis not present

## 2019-08-31 DIAGNOSIS — Z8673 Personal history of transient ischemic attack (TIA), and cerebral infarction without residual deficits: Secondary | ICD-10-CM

## 2019-08-31 DIAGNOSIS — I6521 Occlusion and stenosis of right carotid artery: Secondary | ICD-10-CM

## 2019-08-31 DIAGNOSIS — Z91013 Allergy to seafood: Secondary | ICD-10-CM

## 2019-08-31 DIAGNOSIS — I639 Cerebral infarction, unspecified: Secondary | ICD-10-CM

## 2019-08-31 DIAGNOSIS — I63511 Cerebral infarction due to unspecified occlusion or stenosis of right middle cerebral artery: Secondary | ICD-10-CM | POA: Diagnosis not present

## 2019-08-31 DIAGNOSIS — Z90710 Acquired absence of both cervix and uterus: Secondary | ICD-10-CM

## 2019-08-31 DIAGNOSIS — E1165 Type 2 diabetes mellitus with hyperglycemia: Secondary | ICD-10-CM

## 2019-08-31 DIAGNOSIS — Z79890 Hormone replacement therapy (postmenopausal): Secondary | ICD-10-CM | POA: Diagnosis not present

## 2019-08-31 DIAGNOSIS — Z8249 Family history of ischemic heart disease and other diseases of the circulatory system: Secondary | ICD-10-CM

## 2019-08-31 DIAGNOSIS — E039 Hypothyroidism, unspecified: Secondary | ICD-10-CM | POA: Diagnosis present

## 2019-08-31 DIAGNOSIS — Z7901 Long term (current) use of anticoagulants: Secondary | ICD-10-CM | POA: Diagnosis not present

## 2019-08-31 DIAGNOSIS — Z882 Allergy status to sulfonamides status: Secondary | ICD-10-CM

## 2019-08-31 DIAGNOSIS — Z886 Allergy status to analgesic agent status: Secondary | ICD-10-CM | POA: Diagnosis not present

## 2019-08-31 DIAGNOSIS — E038 Other specified hypothyroidism: Secondary | ICD-10-CM

## 2019-08-31 DIAGNOSIS — Z87440 Personal history of urinary (tract) infections: Secondary | ICD-10-CM | POA: Diagnosis not present

## 2019-08-31 DIAGNOSIS — R030 Elevated blood-pressure reading, without diagnosis of hypertension: Secondary | ICD-10-CM

## 2019-08-31 DIAGNOSIS — N390 Urinary tract infection, site not specified: Secondary | ICD-10-CM

## 2019-08-31 DIAGNOSIS — H9190 Unspecified hearing loss, unspecified ear: Secondary | ICD-10-CM | POA: Diagnosis not present

## 2019-08-31 DIAGNOSIS — Z9049 Acquired absence of other specified parts of digestive tract: Secondary | ICD-10-CM | POA: Diagnosis not present

## 2019-08-31 DIAGNOSIS — Z91040 Latex allergy status: Secondary | ICD-10-CM

## 2019-08-31 DIAGNOSIS — K5900 Constipation, unspecified: Secondary | ICD-10-CM | POA: Diagnosis not present

## 2019-08-31 DIAGNOSIS — Z794 Long term (current) use of insulin: Secondary | ICD-10-CM

## 2019-08-31 DIAGNOSIS — Z79899 Other long term (current) drug therapy: Secondary | ICD-10-CM

## 2019-08-31 DIAGNOSIS — E118 Type 2 diabetes mellitus with unspecified complications: Secondary | ICD-10-CM

## 2019-08-31 LAB — BASIC METABOLIC PANEL
Anion gap: 11 (ref 5–15)
BUN: 5 mg/dL — ABNORMAL LOW (ref 8–23)
CO2: 21 mmol/L — ABNORMAL LOW (ref 22–32)
Calcium: 8.1 mg/dL — ABNORMAL LOW (ref 8.9–10.3)
Chloride: 106 mmol/L (ref 98–111)
Creatinine, Ser: 0.61 mg/dL (ref 0.44–1.00)
GFR calc Af Amer: >60 mL/min (ref >60)
GFR calc non Af Amer: >60 mL/min (ref >60)
Glucose, Bld: 173 mg/dL — ABNORMAL HIGH (ref 70–99)
Potassium: 3.2 mmol/L — ABNORMAL LOW (ref 3.5–5.1)
Sodium: 138 mmol/L (ref 135–145)

## 2019-08-31 LAB — CBC
HCT: 28.3 % — ABNORMAL LOW (ref 36.0–46.0)
Hemoglobin: 9.1 g/dL — ABNORMAL LOW (ref 12.0–15.0)
MCH: 29.4 pg (ref 26.0–34.0)
MCHC: 32.2 g/dL (ref 30.0–36.0)
MCV: 91.3 fL (ref 80.0–100.0)
Platelets: 207 10*3/uL (ref 150–400)
RBC: 3.1 MIL/uL — ABNORMAL LOW (ref 3.87–5.11)
RDW: 15 % (ref 11.5–15.5)
WBC: 6.4 10*3/uL (ref 4.0–10.5)
nRBC: 0 % (ref 0.0–0.2)

## 2019-08-31 LAB — GLUCOSE, CAPILLARY
Glucose-Capillary: 98 mg/dL (ref 70–99)
Glucose-Capillary: 155 mg/dL — ABNORMAL HIGH (ref 70–99)
Glucose-Capillary: 367 mg/dL — ABNORMAL HIGH (ref 70–99)
Glucose-Capillary: 215 mg/dL — ABNORMAL HIGH (ref 70–99)

## 2019-08-31 MED ORDER — MIDODRINE HCL 10 MG PO TABS: 10.0000 mg | ORAL_TABLET | Freq: Three times a day (TID) | ORAL | 0 refills | Status: DC

## 2019-08-31 MED ORDER — INSULIN DETEMIR 100 UNIT/ML ~~LOC~~ SOLN
40.0000 [IU] | Freq: Two times a day (BID) | SUBCUTANEOUS | Status: DC
  Administered 2019-08-31 – 2019-09-04 (×8): 40 [IU] via SUBCUTANEOUS
  Filled 2019-08-31 (×9): qty 0.4

## 2019-08-31 MED ORDER — ATORVASTATIN CALCIUM 40 MG PO TABS: 40.0000 mg | ORAL_TABLET | Freq: Every day | ORAL | 0 refills | Status: DC

## 2019-08-31 MED ORDER — ACETAMINOPHEN 160 MG/5ML PO SOLN: 650.0000 mg | ORAL | Status: DC | PRN

## 2019-08-31 MED ORDER — DOCUSATE SODIUM 100 MG PO CAPS
100.0000 mg | ORAL_CAPSULE | Freq: Every day | ORAL | Status: DC
  Administered 2019-09-01 – 2019-09-09 (×9): 100 mg via ORAL
  Filled 2019-08-31 (×9): qty 1

## 2019-08-31 MED ORDER — DULOXETINE HCL 60 MG PO CPEP
60.0000 mg | ORAL_CAPSULE | Freq: Every day | ORAL | Status: DC
  Administered 2019-09-01 – 2019-09-09 (×9): 60 mg via ORAL
  Filled 2019-08-31 (×9): qty 1

## 2019-08-31 MED ORDER — PANTOPRAZOLE SODIUM 40 MG PO TBEC
40.0000 mg | DELAYED_RELEASE_TABLET | Freq: Every day | ORAL | Status: DC
  Administered 2019-09-01 – 2019-09-09 (×9): 40 mg via ORAL
  Filled 2019-08-31 (×9): qty 1

## 2019-08-31 MED ORDER — APIXABAN 5 MG PO TABS
5.0000 mg | ORAL_TABLET | Freq: Two times a day (BID) | ORAL | Status: DC
  Administered 2019-08-31 – 2019-09-09 (×18): 5 mg via ORAL
  Filled 2019-08-31 (×18): qty 1

## 2019-08-31 MED ORDER — TRIMETHOPRIM 100 MG PO TABS
100.0000 mg | ORAL_TABLET | Freq: Every day | ORAL | Status: DC
  Administered 2019-09-01 – 2019-09-09 (×9): 100 mg via ORAL
  Filled 2019-08-31 (×9): qty 1

## 2019-08-31 MED ORDER — ACETAMINOPHEN 325 MG PO TABS: 650.0000 mg | ORAL_TABLET | ORAL | 0 refills | Status: DC | PRN

## 2019-08-31 MED ORDER — INSULIN ASPART 100 UNIT/ML ~~LOC~~ SOLN
0.0000 [IU] | Freq: Three times a day (TID) | SUBCUTANEOUS | Status: DC
  Administered 2019-09-01: 3 [IU] via SUBCUTANEOUS
  Administered 2019-09-02 (×2): 5 [IU] via SUBCUTANEOUS
  Administered 2019-09-03 – 2019-09-04 (×2): 3 [IU] via SUBCUTANEOUS
  Administered 2019-09-04: 5 [IU] via SUBCUTANEOUS
  Administered 2019-09-05 (×2): 8 [IU] via SUBCUTANEOUS
  Administered 2019-09-05: 11 [IU] via SUBCUTANEOUS
  Administered 2019-09-06: 15 [IU] via SUBCUTANEOUS
  Administered 2019-09-06: 8 [IU] via SUBCUTANEOUS
  Administered 2019-09-06: 5 [IU] via SUBCUTANEOUS
  Administered 2019-09-07: 8 [IU] via SUBCUTANEOUS
  Administered 2019-09-07: 3 [IU] via SUBCUTANEOUS
  Administered 2019-09-07: 5 [IU] via SUBCUTANEOUS
  Administered 2019-09-08: 8 [IU] via SUBCUTANEOUS
  Administered 2019-09-08: 11 [IU] via SUBCUTANEOUS
  Administered 2019-09-08 – 2019-09-09 (×2): 3 [IU] via SUBCUTANEOUS
  Administered 2019-09-09: 5 [IU] via SUBCUTANEOUS

## 2019-08-31 MED ORDER — SENNOSIDES-DOCUSATE SODIUM 8.6-50 MG PO TABS: 2.0000 | ORAL_TABLET | Freq: Every evening | ORAL | Status: DC | PRN

## 2019-08-31 MED ORDER — ASPIRIN EC 81 MG PO TBEC
81.0000 mg | DELAYED_RELEASE_TABLET | Freq: Every day | ORAL | Status: DC
  Administered 2019-09-01 – 2019-09-09 (×9): 81 mg via ORAL
  Filled 2019-08-31 (×9): qty 1

## 2019-08-31 MED ORDER — VITAMIN B-12 1000 MCG PO TABS
1000.0000 ug | ORAL_TABLET | Freq: Every day | ORAL | Status: DC
  Administered 2019-09-01 – 2019-09-09 (×9): 1000 ug via ORAL
  Filled 2019-08-31 (×9): qty 1

## 2019-08-31 MED ORDER — LINAGLIPTIN 5 MG PO TABS
5.0000 mg | ORAL_TABLET | Freq: Every day | ORAL | Status: DC
  Administered 2019-09-01 – 2019-09-09 (×9): 5 mg via ORAL
  Filled 2019-08-31 (×9): qty 1

## 2019-08-31 MED ORDER — LEVOTHYROXINE SODIUM 112 MCG PO TABS
112.0000 ug | ORAL_TABLET | Freq: Every day | ORAL | Status: DC
  Administered 2019-09-01 – 2019-09-09 (×9): 112 ug via ORAL
  Filled 2019-08-31 (×9): qty 1

## 2019-08-31 MED ORDER — ATORVASTATIN CALCIUM 40 MG PO TABS
40.0000 mg | ORAL_TABLET | Freq: Every day | ORAL | Status: DC
  Administered 2019-09-01 – 2019-09-09 (×9): 40 mg via ORAL
  Filled 2019-08-31 (×9): qty 1

## 2019-08-31 MED ORDER — OXYCODONE HCL 5 MG PO TABS: 5.0000 mg | ORAL_TABLET | ORAL | Status: DC | PRN

## 2019-08-31 MED ORDER — ASPIRIN 81 MG PO TBEC: 81.0000 mg | DELAYED_RELEASE_TABLET | Freq: Every day | ORAL | 11 refills | Status: DC

## 2019-08-31 MED ORDER — ACETAMINOPHEN 650 MG RE SUPP: 650.0000 mg | RECTAL | Status: DC | PRN

## 2019-08-31 MED ORDER — MIDODRINE HCL 5 MG PO TABS
10.0000 mg | ORAL_TABLET | Freq: Three times a day (TID) | ORAL | Status: DC
  Administered 2019-09-01 – 2019-09-02 (×4): 10 mg via ORAL
  Filled 2019-08-31 (×4): qty 2

## 2019-08-31 MED ORDER — ACETAMINOPHEN 325 MG PO TABS: 650.0000 mg | ORAL_TABLET | ORAL | Status: DC | PRN

## 2019-08-31 MED ORDER — INSULIN ASPART 100 UNIT/ML ~~LOC~~ SOLN
6.0000 [IU] | Freq: Three times a day (TID) | SUBCUTANEOUS | Status: DC
  Administered 2019-09-01 – 2019-09-09 (×23): 6 [IU] via SUBCUTANEOUS

## 2019-08-31 NOTE — Progress Notes (Signed)
Jaclyn Ribas, MD  Physician  Physical Medicine and Rehabilitation  Consult Note     Signed  Date of Service:  08/23/2019  5:38 AM      Related encounter: ED to Hosp-Admission (Current) from 08/21/2019 in [facility] 4E CV SURGICAL PROGRESSIVE CARE      Signed      Expand All Collapse All  Show:Clear all [x] Manual[x] Template[] Copied  Added by: [x] Angiulli, Lavon Paganini, PA-C[x] Raulkar, Clide Deutscher, MD  [] Hover for details          Physical Medicine and Rehabilitation Consult Reason for Consult: Left side weakness Referring Physician: [facility]     HPI: Jaclyn [name] is a 84 y.o. right-handed female with history of atrial fibrillation maintained on Eliquis, bilateral ICA stenosis, recurrent UTIs, fibromyalgia, diabetes mellitus, hyperlipidemia, multiple TIAs without residual deficits.  Per chart review patient lives with son.  1 level home.  Son works during the day.  Independent with assistive device.  She was able to care for herself needing only limited assistance for dressing at times.  She does not drive.  Presented 08/21/2018 while left-sided weakness and altered mental status.  She was found down by her son.  Cranial CT scan negative.  Patient did not receive TPA.  MRI showed a 6 mm acute ischemic nonhemorrhagic periventricular white matter infarction involving the posterior right frontal corona radiata centrum semiovale.  CT angiogram of head and neck negative for large vessel occlusion.  Echocardiogram with ejection fraction of 70% no wall motion abnormalities.  EEG negative.  Admission chemistries with sodium 132, glucose 360, hemoglobin 11.4, urinalysis negative nitrite, hemoglobin A1c 14.2.  Neurology follow-up patient currently remains on Eliquis as prior to admission.  Tolerating a regular diet.  Therapy evaluations completed with recommendations of physical medicine rehab consult.     Review of Systems  Constitutional: Negative for chills and fever.  HENT: Negative for hearing  loss.   Eyes: Negative for blurred vision and double vision.  Respiratory: Negative for cough and shortness of breath.   Cardiovascular: Positive for palpitations and leg swelling. Negative for chest pain.  Gastrointestinal: Positive for constipation. Negative for heartburn, nausea and vomiting.  Genitourinary: Positive for urgency. Negative for dysuria, flank pain and hematuria.  Musculoskeletal: Positive for joint pain and myalgias.  Skin: Negative for rash.  Neurological: Positive for speech change, weakness and headaches.  Psychiatric/Behavioral: The patient has insomnia.   All other systems reviewed and are negative.       Past Medical History:  Diagnosis Date   Atrial fibrillation ([place])     Benign neoplasm of colon     Carotid artery occlusion      60-79% right ICA stenosis   Carotid bruit     Cerebrovascular disease, unspecified     Difficult intubation 2008    During surgery to remove large polyp   Diverticulosis of colon (without mention of hemorrhage)     Dysthymic disorder     Fibromyalgia     Headache(784.0)     Insomnia, unspecified     Interstitial cystitis     Myalgia and myositis, unspecified     Osteoarthrosis, unspecified whether generalized or localized, unspecified site     Other and unspecified hyperlipidemia     Other chest pain     Other specified benign mammary dysplasias     TIA (transient ischemic attack)     Type II or unspecified type diabetes mellitus without mention of complication, not stated as uncontrolled     Unspecified essential hypertension  Unspecified hypothyroidism     Unspecified vitamin D deficiency     Urinary tract infection, site not specified           Past Surgical History:  Procedure Laterality Date   ABDOMINAL HYSTERECTOMY   1988    cervical dysplasia   CARDIAC CATHETERIZATION   02/19/06    EF 60%   COLONOSCOPY       RIGHT COLECTOMY   2005    for villous adenoma of the cecum Dr.streck    VESICOVAGINAL FISTULA CLOSURE W/ TAH   1990    w/ cystocele &retocele repairs Dr. Ree Edman         Family History  Problem Relation Age of Onset   Lymphoma Father     Hypertension Father     Stroke Father     Hyperlipidemia Father     Hypertension Mother     Allergies Brother     Hypertension Sister          3   Breast cancer Sister     Fibromyalgia Sister          3   Hyperlipidemia Sister          3    Social History:  reports that she has never smoked. She has never used smokeless tobacco. She reports that she does not drink alcohol and does not use drugs. Allergies:       Allergies  Allergen Reactions   Naproxen Sodium Other (See Comments)      Fever/aches and pains   Statins Other (See Comments)      myalgias   Sulfonamide Derivatives Hives and Itching   Latex Itching and Rash   Shellfish Allergy Itching, Swelling and Rash      Seafood, shrimp          Medications Prior to Admission  Medication Sig Dispense Refill   apixaban (ELIQUIS) 5 MG TABS tablet Take 1 tablet (5 mg total) by mouth 2 (two) times daily. 180 tablet 1   Insulin Glargine (LANTUS SOLOSTAR) 100 UNIT/ML Solostar Pen Inject 65 Units into the skin daily. 15 mL 11   levothyroxine (SYNTHROID) 112 MCG tablet Take 1 tablet (112 mcg total) by mouth daily. 90 tablet 3   sitaGLIPtin (JANUVIA) 100 MG tablet Take 1 tablet (100 mg total) by mouth daily. 30 tablet 11   trimethoprim (TRIMPEX) 100 MG tablet Take 1 tablet (100 mg total) by mouth daily. 90 tablet 3   B-D ULTRAFINE III SHORT PEN 31G X 8 MM MISC USE TO INJECT INSULIN ONCE DAILY. DX: E11.65 100 each 3   DULoxetine (CYMBALTA) 30 MG capsule Take 2 capsules (60 mg total) by mouth daily. (Patient not taking: Reported on 08/21/2019) 180 capsule 1   vitamin B-12 (CYANOCOBALAMIN) 1000 MCG tablet Take 1 tablet (1,000 mcg total) by mouth daily. 30 tablet 11   Vitamin D, Ergocalciferol, (DRISDOL) 1.25 MG (50000 UT) CAPS capsule Take 1 capsule  (50,000 Units total) by mouth every 7 (seven) days. 12 capsule 0      Home: Home Living Family/patient expects to be discharged to:: Private residence Living Arrangements:  (with son) Available Help at Discharge:  (son works during the day) Type of Home: House Home Access: Level entry [place]: One level Bathroom Shower/Tub: Public librarian, Architectural technologist: Standard Home Equipment: Radio producer - single point, Grab bars - tub/shower, Bedside commode  Lives With: Son  Functional History: Prior Function Level of Independence: Independent with assistive device(s) Comments: was able to  care for herself; needed help dressing at times. usually wears depends, uses a cane ; Able to make easy things in kitchen. Does not drive. Home alone during the day while son works. Functional Status:  Mobility: Bed Mobility Overal bed mobility: Needs Assistance Bed Mobility: Supine to Sit Supine to sit: Min assist, HOB elevated General bed mobility comments: manual assist to place RUE on rail, assist with trunk to get to EOB with LUE being left behind without awareness. Transfers Overall transfer level: Needs assistance Equipment used: 1 person hand held assist Transfers: Sit to/from Stand, Stand Pivot Transfers Sit to Stand: Min assist Stand pivot transfers: Mod assist General transfer comment: assist to steady in standing with flexed trunk and UE support on bed; Cues for upright. performed x3 from EOB. SPT bed to chair with Mod A for sequencing, LLE advancement and balance. Ambulation/Gait General Gait Details: Need 2 person assist for safe ambulation attempt.   ADL:   Cognition: Cognition Overall Cognitive Status: Impaired/Different from baseline Orientation Level: Oriented to person, Oriented to place, Oriented to time, Disoriented to situation Cognition Arousal/Alertness: Awake/alert Behavior During Therapy: WFL for tasks assessed/performed Overall Cognitive Status: Impaired/Different  from baseline Area of Impairment: Orientation, Attention, Memory, Following commands, Safety/judgement, Awareness, Problem solving Orientation Level: Disoriented to, Time, Situation Current Attention Level: Sustained Memory: Decreased short-term memory Following Commands: Follows one step commands with increased time (repetition) Safety/Judgement: Decreased awareness of deficits, Decreased awareness of safety Awareness: Intellectual Problem Solving: Difficulty sequencing, Requires verbal cues, Requires tactile cues, Slow processing General Comments: "June". Tangential at times; cues to stay attending to task and repetition to follow commands. Right gaze preference and left inattention. Possible visual field deficits?   Blood pressure (!) 132/57, pulse 68, temperature 97.7 F (36.5 C), temperature source Oral, resp. rate 17, height 5\' 2"  (1.575 m), weight 66 kg, SpO2 94 %.   Physical Exam General: Alert and oriented x 3, No apparent distress HEENT: Head is normocephalic, atraumatic, PERRLA, EOMI, sclera anicteric, oral mucosa pink and moist, poor dentition, ext ear canals clear,  Neck: Supple without JVD or lymphadenopathy Heart: Reg rate and rhythm. No murmurs rubs or gallops Chest: CTA bilaterally without wheezes, rales, or rhonchi; no distress Abdomen: Soft, non-tender, non-distended, bowel sounds positive. Extremities: No clubbing, cyanosis, or edema. Pulses are 2+ Skin: Clean and intact without signs of breakdown Neuro: Patient is alert in no acute distress.  Right gaze preference and left sided neglect. Follows simple commands.  Provides name and age. Clear speech.  Limited medical historian.  LUE: 4/5 strength throughout. Otherwise 5/5 strength.  Psych: Pt's affect is friendly. Verbose. Tangential.      Lab Results Last 24 Hours       Results for orders placed or performed during the hospital encounter of 08/21/19 (from the past 24 hour(s))  Glucose, capillary     Status:  Abnormal    Collection Time: 08/22/19  6:27 AM  Result Value Ref Range    Glucose-Capillary 353 (H) 70 - 99 mg/dL  CBC with Differential/Platelet     Status: None    Collection Time: 08/22/19 11:37 AM  Result Value Ref Range    WBC 7.2 4.0 - 10.5 K/uL    RBC 4.05 3.87 - 5.11 MIL/uL    Hemoglobin 12.2 12.0 - 15.0 g/dL    HCT 36.0 36 - 46 %    MCV 88.9 80.0 - 100.0 fL    MCH 30.1 26.0 - 34.0 pg    MCHC 33.9 30.0 -  36.0 g/dL    RDW 14.5 11.5 - 15.5 %    Platelets 217 150 - 400 K/uL    nRBC 0.0 0.0 - 0.2 %    Neutrophils Relative % 64 %    Neutro Abs 4.6 1.7 - 7.7 K/uL    Lymphocytes Relative 26 %    Lymphs Abs 1.9 0.7 - 4.0 K/uL    Monocytes Relative 9 %    Monocytes Absolute 0.7 0 - 1 K/uL    Eosinophils Relative 1 %    Eosinophils Absolute 0.1 0 - 0 K/uL    Basophils Relative 0 %    Basophils Absolute 0.0 0 - 0 K/uL    Immature Granulocytes 0 %    Abs Immature Granulocytes 0.02 0.00 - 0.07 K/uL  Comprehensive metabolic panel     Status: Abnormal    Collection Time: 08/22/19 11:37 AM  Result Value Ref Range    Sodium 139 135 - 145 mmol/L    Potassium 3.6 3.5 - 5.1 mmol/L    Chloride 107 98 - 111 mmol/L    CO2 21 (L) 22 - 32 mmol/L    Glucose, Bld 127 (H) 70 - 99 mg/dL    BUN 6 (L) 8 - 23 mg/dL    Creatinine, Ser 0.65 0.44 - 1.00 mg/dL    Calcium 8.7 (L) 8.9 - 10.3 mg/dL    Total Protein 6.4 (L) 6.5 - 8.1 g/dL    Albumin 3.4 (L) 3.5 - 5.0 g/dL    AST 19 15 - 41 U/L    ALT 16 0 - 44 U/L    Alkaline Phosphatase 60 38 - 126 U/L    Total Bilirubin 0.6 0.3 - 1.2 mg/dL    GFR calc non Af Amer >60 >60 mL/min    GFR calc Af Amer >60 >60 mL/min    Anion gap 11 5 - 15  Vitamin B12     Status: None    Collection Time: 08/22/19 11:37 AM  Result Value Ref Range    Vitamin B-12 733 180 - 914 pg/mL  TSH     Status: None    Collection Time: 08/22/19 11:37 AM  Result Value Ref Range    TSH 2.876 0.350 - 4.500 uIU/mL  Glucose, capillary     Status: Abnormal    Collection Time:  08/22/19 12:34 PM  Result Value Ref Range    Glucose-Capillary 136 (H) 70 - 99 mg/dL  Glucose, capillary     Status: Abnormal    Collection Time: 08/22/19  3:44 PM  Result Value Ref Range    Glucose-Capillary 165 (H) 70 - 99 mg/dL    Comment 1 Notify RN      Comment 2 Document in Chart    Ammonia     Status: None    Collection Time: 08/22/19  4:29 PM  Result Value Ref Range    Ammonia 26 9 - 35 umol/L  Glucose, capillary     Status: Abnormal    Collection Time: 08/22/19  9:38 PM  Result Value Ref Range    Glucose-Capillary 106 (H) 70 - 99 mg/dL    Comment 1 Notify RN      Comment 2 Document in Chart    CBC with Differential/Platelet     Status: None    Collection Time: 08/23/19  4:25 AM  Result Value Ref Range    WBC 5.5 4.0 - 10.5 K/uL    RBC 4.11 3.87 - 5.11 MIL/uL    Hemoglobin 12.0  12.0 - 15.0 g/dL    HCT 36.2 36 - 46 %    MCV 88.1 80.0 - 100.0 fL    MCH 29.2 26.0 - 34.0 pg    MCHC 33.1 30.0 - 36.0 g/dL    RDW 14.6 11.5 - 15.5 %    Platelets 204 150 - 400 K/uL    nRBC 0.0 0.0 - 0.2 %    Neutrophils Relative % 64 %    Neutro Abs 3.5 1.7 - 7.7 K/uL    Lymphocytes Relative 23 %    Lymphs Abs 1.3 0.7 - 4.0 K/uL    Monocytes Relative 11 %    Monocytes Absolute 0.6 0 - 1 K/uL    Eosinophils Relative 2 %    Eosinophils Absolute 0.1 0 - 0 K/uL    Basophils Relative 0 %    Basophils Absolute 0.0 0 - 0 K/uL    Immature Granulocytes 0 %    Abs Immature Granulocytes 0.02 0.00 - 0.07 K/uL  Comprehensive metabolic panel     Status: Abnormal    Collection Time: 08/23/19  4:25 AM  Result Value Ref Range    Sodium 142 135 - 145 mmol/L    Potassium 3.0 (L) 3.5 - 5.1 mmol/L    Chloride 108 98 - 111 mmol/L    CO2 23 22 - 32 mmol/L    Glucose, Bld 165 (H) 70 - 99 mg/dL    BUN 5 (L) 8 - 23 mg/dL    Creatinine, Ser 0.61 0.44 - 1.00 mg/dL    Calcium 8.8 (L) 8.9 - 10.3 mg/dL    Total Protein 6.1 (L) 6.5 - 8.1 g/dL    Albumin 3.1 (L) 3.5 - 5.0 g/dL    AST 20 15 - 41 U/L    ALT 16  0 - 44 U/L    Alkaline Phosphatase 57 38 - 126 U/L    Total Bilirubin 1.0 0.3 - 1.2 mg/dL    GFR calc non Af Amer >60 >60 mL/min    GFR calc Af Amer >60 >60 mL/min    Anion gap 11 5 - 15  Magnesium     Status: Abnormal    Collection Time: 08/23/19  4:25 AM  Result Value Ref Range    Magnesium 1.4 (L) 1.7 - 2.4 mg/dL  Phosphorus     Status: None    Collection Time: 08/23/19  4:25 AM  Result Value Ref Range    Phosphorus 3.8 2.5 - 4.6 mg/dL       Imaging Results (Last 48 hours)  CT Head Wo Contrast   Result Date: 08/21/2019 CLINICAL DATA:  Neuro deficits. Subacute arm weakness. Increasingly confused since Friday. EXAM: CT HEAD WITHOUT CONTRAST TECHNIQUE: Contiguous axial images were obtained from the base of the skull through the vertex without intravenous contrast. COMPARISON:  01/24/2019 FINDINGS: Brain: Moderate central and cortical atrophy. Periventricular white matter changes are consistent with small vessel disease. Vascular: Dense atherosclerotic calcification of the internal carotid arteries. No hyperdense vessels. Skull: Normal. Negative for fracture or focal lesion. Sinuses/Orbits: No acute finding. Other: None. IMPRESSION: 1. No evidence for acute intracranial abnormality. 2. Atrophy and small vessel disease. Electronically Signed   By: Nolon Nations M.D.   On: 08/21/2019 20:58    MR BRAIN WO CONTRAST   Result Date: 08/21/2019 CLINICAL DATA:  Initial evaluation for increased confusion, focal neural deficit. EXAM: MRI HEAD WITHOUT CONTRAST TECHNIQUE: Multiplanar, multiecho pulse sequences of the brain and surrounding structures were obtained without intravenous contrast.  COMPARISON:  Prior head CT from earlier the same day. FINDINGS: Brain: Examination mildly degraded by motion artifact. Generalized age-related cerebral atrophy. Patchy and confluent T2/FLAIR hyperintensity within the periventricular and deep white matter of both cerebral hemispheres most consistent with chronic small  vessel ischemic disease, moderate in nature. Patchy involvement of the pons noted. 6 mm acute ischemic infarcts seen involving the periventricular white matter of the posterior right frontal corona radiata/centrum semi ovale (series 5, image 83). No associated hemorrhage or mass effect. No other evidence for acute or subacute ischemia. Gray-white matter differentiation otherwise maintained. No encephalomalacia to suggest chronic cortical infarction. No acute intracranial hemorrhage. Single chronic microhemorrhage noted at the inferior cerebellum, likely hypertensive in nature. No other evidence for chronic intracranial hemorrhage. No mass lesion, midline shift or mass effect. Diffuse ventricular prominence most likely related to global parenchymal volume loss without hydrocephalus. No extra-axial fluid collection. Pituitary gland suprasellar region within normal limits. Midline structures intact. Vascular: Major intracranial vascular flow voids are maintained. Skull and upper cervical spine: Craniocervical junction within normal limits. Bone marrow signal intensity normal. No scalp soft tissue abnormality. Sinuses/Orbits: Patient status post ocular lens replacement on the left. Globes and orbital soft tissues demonstrate no acute finding. Mild scattered mucosal thickening noted throughout the ethmoidal air cells and maxillary sinuses. Trace bilateral mastoid effusions noted, of doubtful significance. Visualized nasopharynx within normal limits. Other: None. IMPRESSION: 1. 6 mm acute ischemic nonhemorrhagic periventricular white matter infarct involving the posterior right frontal corona radiata/centrum semi ovale. 2. No other acute intracranial abnormality. 3. Age-related cerebral atrophy with moderate chronic microvascular ischemic disease. Electronically Signed   By: Jeannine Boga M.D.   On: 08/21/2019 23:44    EEG adult   Result Date: 08/22/2019 [name], MD     08/22/2019  3:21 PM  ELECTROENCEPHALOGRAM REPORT Patient: Jaclyn [name]       Room #: 3X54M EEG No. ID: [id number] Age: 84 y.o.        Sex: female Requesting Physician: Florene Glen Report Date:  08/22/2019       Interpreting Physician: [name] History: Amoreena Neubert is an 84 y.o. female with altered mental status Medications: Eliquis, Lipitor, Insulin, Synthroid, Tradjenta Conditions of Recording:  This is a 21 channel routine scalp EEG performed with bipolar and monopolar montages arranged in accordance to the international 10/20 system of electrode placement. One channel was dedicated to EKG recording. The patient is in the awake and drowsy states. Description:  The patient is uncooperative and muscle and movement artifact are prominent during the recording.  When a waking background activity can be evaluated it consists of a low voltage, symmetrical, fairly well organized, 8 Hz alpha activity, seen from the parieto-occipital and posterior temporal regions.  Low voltage fast activity, poorly organized, is seen anteriorly and is at times superimposed on more posterior regions.  A mixture of theta and alpha rhythms are seen from the central and temporal regions. The patient drowses with slowing to irregular, low voltage theta and beta activity.  Stage II sleep is not obtained. No epileptiform activity is noted.  Hyperventilation and intermittent photic stimulation were not performed. IMPRESSION: Normal electroencephalogram, awake and drowsy. There are no focal lateralizing or epileptiform features. [name], MD Neurology [phone] 08/22/2019, 3:17 PM    ECHOCARDIOGRAM COMPLETE   Result Date: 08/22/2019    ECHOCARDIOGRAM REPORT   Patient Name:   Jaclyn [name] Date of Exam: 08/22/2019 Medical Rec #:  [id number]       Height:  62.0 in Accession #:    [id number]      Weight:       145.5 lb Date of Birth:  1935/04/08       BSA:          1.670 m Patient Age:    64 years        BP:           127/66 mmHg Patient Gender: F                HR:           70 bpm. Exam Location:  Inpatient Procedure: 2D Echo, Color Doppler and Cardiac Doppler Indications:    Stroke i163.9  History:        Patient has prior history of Echocardiogram examinations, most                 recent 06/21/2017. Arrythmias:Atrial Fibrillation; Risk                 Factors:Hypertension, Diabetes and Dyslipidemia. COVID+ on                 01/15/19.  Sonographer:    [name] Senior RDCS Referring Phys: [id number] [place]  1. Left ventricular ejection fraction, by estimation, is 65 to 70%. The left ventricle has normal function. The left ventricle has no regional wall motion abnormalities. There is moderate left ventricular hypertrophy. Left ventricular diastolic parameters are indeterminate.  2. Right ventricular systolic function is normal. The right ventricular size is normal. Mildly increased right ventricular wall thickness. Tricuspid regurgitation signal is inadequate for assessing PA pressure.  3. The mitral valve is grossly normal, mild annular calcification. Trivial mitral valve regurgitation.  4. The aortic valve is tricuspid, mild annular calcification. Aortic valve regurgitation is not visualized.  5. The inferior vena cava is normal in size with greater than 50% respiratory variability, suggesting right atrial pressure of 3 mmHg. FINDINGS  Left Ventricle: Left ventricular ejection fraction, by estimation, is 65 to 70%. The left ventricle has normal function. The left ventricle has no regional wall motion abnormalities. The left ventricular internal cavity size was small. There is moderate  left ventricular hypertrophy. Left ventricular diastolic parameters are indeterminate. Right Ventricle: The right ventricular size is normal. Mildly increased right ventricular wall thickness. Right ventricular systolic function is normal. Tricuspid regurgitation signal is inadequate for assessing PA pressure. Left Atrium: Left atrial size was normal in size.  Right Atrium: Right atrial size was normal in size. Pericardium: Trivial pericardial effusion is present. The pericardial effusion is posterior to the left ventricle. Presence of pericardial fat pad. Mitral Valve: The mitral valve is grossly normal. Mild mitral annular calcification. Trivial mitral valve regurgitation. Tricuspid Valve: The tricuspid valve is grossly normal. Tricuspid valve regurgitation is trivial. Aortic Valve: The aortic valve is tricuspid. Aortic valve regurgitation is not visualized. Mild aortic valve annular calcification. Pulmonic Valve: The pulmonic valve was grossly normal. Pulmonic valve regurgitation is trivial. Aorta: The aortic root is normal in size and structure. Venous: The inferior vena cava is normal in size with greater than 50% respiratory variability, suggesting right atrial pressure of 3 mmHg. IAS/Shunts: No atrial level shunt detected by color flow Doppler.  LEFT VENTRICLE PLAX 2D LVIDd:         2.60 cm  Diastology LVIDs:         1.60 cm  LV e' lateral:   4.90 cm/s LV PW:  1.50 cm  LV E/e' lateral: 15.6 LV IVS:        1.30 cm  LV e' medial:    5.66 cm/s LVOT diam:     1.60 cm  LV E/e' medial:  13.5 LV SV:         36 LV SV Index:   22 LVOT Area:     2.01 cm  RIGHT VENTRICLE RV S prime:     12.00 cm/s TAPSE (M-mode): 1.7 cm LEFT ATRIUM             Index       RIGHT ATRIUM           Index LA diam:        1.70 cm 1.02 cm/m  RA Area:     10.30 cm LA Vol (A2C):   28.0 ml 16.77 ml/m RA Volume:   22.40 ml  13.41 ml/m LA Vol (A4C):   27.4 ml 16.41 ml/m LA Biplane Vol: 28.5 ml 17.07 ml/m  AORTIC VALVE LVOT Vmax:   76.30 cm/s LVOT Vmean:  54.100 cm/s LVOT VTI:    0.179 m  AORTA Ao Root diam: 3.10 cm Ao Asc diam:  3.00 cm MITRAL VALVE MV Area (PHT): 1.91 cm    SHUNTS MV Decel Time: 398 msec    Systemic VTI:  0.18 m MV E velocity: 76.40 cm/s  Systemic Diam: 1.60 cm MV A velocity: 90.00 cm/s MV E/A ratio:  0.85 Rozann Lesches MD Electronically signed by Rozann Lesches MD  Signature Date/Time: 08/22/2019/11:43:40 AM    Final     CT ANGIO HEAD CODE STROKE   Result Date: 08/22/2019 CLINICAL DATA:  84 year old female with a small acute right superior frontal white matter lacunar infarct after presenting with arm weakness and confusion. EXAM: CT ANGIOGRAPHY HEAD AND NECK TECHNIQUE: Multidetector CT imaging of the head and neck was performed using the standard protocol during bolus administration of intravenous contrast. Multiplanar CT image reconstructions and MIPs were obtained to evaluate the vascular anatomy. Carotid stenosis measurements (when applicable) are obtained utilizing NASCET criteria, using the distal internal carotid diameter as the denominator. CONTRAST:  64mL OMNIPAQUE IOHEXOL 350 MG/ML SOLN COMPARISON:  Brain MRI and plain head CT 08/21/2019. CTA head and neck and CTP 06/20/2017. FINDINGS: CTA NECK Skeleton: Carious dentition. Chronic cervical spine degeneration. No acute osseous abnormality identified. Upper chest: Chronic apical lung scarring is stable. No superior mediastinal lymphadenopathy. Unchanged borderline to mild circumferential wall thickening of the upper thoracic esophagus. Other neck: Negative. Aortic arch: 3 vessel arch configuration. Stable mild distal arch calcified plaque. Right carotid system: Negative brachiocephalic artery and proximal right CCA. Mild plaque proximal to the bifurcation without stenosis. Bulky chronic mostly calcified plaque at the right ICA origin and bulb resulting in radiographic string sign stenosis (series 5, image 94) over the initial 10 mm segment of the vessel. This appears mildly progressed. The right ICA remains patent and is otherwise negative to the skull base. Left carotid system: Minimal plaque at the left CCA origin without stenosis. Mild plaque proximal to the bifurcation without stenosis. Chronic bulky calcified plaque at the left ICA origin resulting in stenosis numerically estimated at 70% with respect to the  distal vessel (series 9, image 126). This is stable. Left ICA is tortuous and remains patent distal to the stenosis. Vertebral arteries: Calcified proximal right subclavian artery plaque without stenosis. Dominant right vertebral artery is mildly ectatic without plaque or stenosis to the skull base. Moderate calcified plaque in the proximal  left subclavian artery with up to 50% stenosis appears stable. Mild calcified plaque at the non dominant left vertebral artery origin not resulting in stenosis. The left vertebral remains non dominant to the skull base without stenosis. CTA HEAD Posterior circulation: Dominant right vertebral artery primarily supplies the basilar with minimal calcified plaque and no stenosis. Normal right PICA origin. Left V4 segment is non dominant with new severe stenosis or short segment occlusion just proximal to the vertebrobasilar junction on series 11, image 20. This is about 3 mm in length. The basilar artery is patent but newly diminutive since 2019 with multifocal irregularity. Only mild focal basilar artery stenosis results. The SCA and PCA origins remain patent. Chronic bilateral PCA atherosclerosis has progressed now with severe bilateral P1 and P2 segment stenoses (series 11, images 17 and 18). Despite this there is distal bilateral PCA enhancement. Anterior circulation: Both ICA siphons are patent but heavily calcified. On the left there is severe supraclinoid stenosis on series 8, image 63, mildly progressed since 2019. On the right there is moderate to severe cavernous and proximal supraclinoid stenosis due to bulky calcified plaque which appears stable. Carotid termini remain patent. Mild-to-moderate bilateral chronic proximal MCA stenosis has not significantly changed. A1 segments remain normal. Mild bilateral ACA branch irregularity is stable. Distal right M1 stenosis is moderate and increased (series 10, image 16). Left MCA bifurcation remains patent, with stable left MCA  branches demonstrating multifocal mild irregularity. Right MCA bifurcation remains patent with stable right MCA branches demonstrating multifocal mild irregularity. Venous sinuses: Patent. Anatomic variants: Dominant right vertebral artery. Review of the MIP images confirms the above findings IMPRESSION: 1. Negative for large vessel occlusion. 2. But positive for chronic severe atherosclerosis which has progressed since a 2019 CTA as follows: - New Severe stenosis or Short Segment Occlusion of the distal Left Vertebral Artery V4 segment. - New diminutive caliber of the Basilar Artery which remains patent. - RADIOGRAPHIC STRING SIGN stenosis of the proximal Right ICA has progressed. Chronic moderate to severe Left ICA siphon stenosis is stable. - progressed Left ICA supraclinoid stenosis which is now moderate to severe. Stable proximal left Left 70% stenosis. - progressed and Severe bilateral PCA P1/P2 stenoses. - progressed and moderate Right MCA distal M1 stenosis. 3. Stable moderate bilateral proximal MCA stenoses. Stable proximal left subclavian artery plaque with 50% stenosis. Electronically Signed   By: Genevie Ann M.D.   On: 08/22/2019 11:59    CT ANGIO NECK CODE STROKE   Result Date: 08/22/2019 CLINICAL DATA:  84 year old female with a small acute right superior frontal white matter lacunar infarct after presenting with arm weakness and confusion. EXAM: CT ANGIOGRAPHY HEAD AND NECK TECHNIQUE: Multidetector CT imaging of the head and neck was performed using the standard protocol during bolus administration of intravenous contrast. Multiplanar CT image reconstructions and MIPs were obtained to evaluate the vascular anatomy. Carotid stenosis measurements (when applicable) are obtained utilizing NASCET criteria, using the distal internal carotid diameter as the denominator. CONTRAST:  43mL OMNIPAQUE IOHEXOL 350 MG/ML SOLN COMPARISON:  Brain MRI and plain head CT 08/21/2019. CTA head and neck and CTP 06/20/2017.  FINDINGS: CTA NECK Skeleton: Carious dentition. Chronic cervical spine degeneration. No acute osseous abnormality identified. Upper chest: Chronic apical lung scarring is stable. No superior mediastinal lymphadenopathy. Unchanged borderline to mild circumferential wall thickening of the upper thoracic esophagus. Other neck: Negative. Aortic arch: 3 vessel arch configuration. Stable mild distal arch calcified plaque. Right carotid system: Negative brachiocephalic artery and proximal right  CCA. Mild plaque proximal to the bifurcation without stenosis. Bulky chronic mostly calcified plaque at the right ICA origin and bulb resulting in radiographic string sign stenosis (series 5, image 94) over the initial 10 mm segment of the vessel. This appears mildly progressed. The right ICA remains patent and is otherwise negative to the skull base. Left carotid system: Minimal plaque at the left CCA origin without stenosis. Mild plaque proximal to the bifurcation without stenosis. Chronic bulky calcified plaque at the left ICA origin resulting in stenosis numerically estimated at 70% with respect to the distal vessel (series 9, image 126). This is stable. Left ICA is tortuous and remains patent distal to the stenosis. Vertebral arteries: Calcified proximal right subclavian artery plaque without stenosis. Dominant right vertebral artery is mildly ectatic without plaque or stenosis to the skull base. Moderate calcified plaque in the proximal left subclavian artery with up to 50% stenosis appears stable. Mild calcified plaque at the non dominant left vertebral artery origin not resulting in stenosis. The left vertebral remains non dominant to the skull base without stenosis. CTA HEAD Posterior circulation: Dominant right vertebral artery primarily supplies the basilar with minimal calcified plaque and no stenosis. Normal right PICA origin. Left V4 segment is non dominant with new severe stenosis or short segment occlusion just  proximal to the vertebrobasilar junction on series 11, image 20. This is about 3 mm in length. The basilar artery is patent but newly diminutive since 2019 with multifocal irregularity. Only mild focal basilar artery stenosis results. The SCA and PCA origins remain patent. Chronic bilateral PCA atherosclerosis has progressed now with severe bilateral P1 and P2 segment stenoses (series 11, images 17 and 18). Despite this there is distal bilateral PCA enhancement. Anterior circulation: Both ICA siphons are patent but heavily calcified. On the left there is severe supraclinoid stenosis on series 8, image 63, mildly progressed since 2019. On the right there is moderate to severe cavernous and proximal supraclinoid stenosis due to bulky calcified plaque which appears stable. Carotid termini remain patent. Mild-to-moderate bilateral chronic proximal MCA stenosis has not significantly changed. A1 segments remain normal. Mild bilateral ACA branch irregularity is stable. Distal right M1 stenosis is moderate and increased (series 10, image 16). Left MCA bifurcation remains patent, with stable left MCA branches demonstrating multifocal mild irregularity. Right MCA bifurcation remains patent with stable right MCA branches demonstrating multifocal mild irregularity. Venous sinuses: Patent. Anatomic variants: Dominant right vertebral artery. Review of the MIP images confirms the above findings IMPRESSION: 1. Negative for large vessel occlusion. 2. But positive for chronic severe atherosclerosis which has progressed since a 2019 CTA as follows: - New Severe stenosis or Short Segment Occlusion of the distal Left Vertebral Artery V4 segment. - New diminutive caliber of the Basilar Artery which remains patent. - RADIOGRAPHIC STRING SIGN stenosis of the proximal Right ICA has progressed. Chronic moderate to severe Left ICA siphon stenosis is stable. - progressed Left ICA supraclinoid stenosis which is now moderate to severe. Stable  proximal left Left 70% stenosis. - progressed and Severe bilateral PCA P1/P2 stenoses. - progressed and moderate Right MCA distal M1 stenosis. 3. Stable moderate bilateral proximal MCA stenoses. Stable proximal left subclavian artery plaque with 50% stenosis. Electronically Signed   By: Genevie Ann M.D.   On: 08/22/2019 11:59       Assessment/Plan: Diagnosis: R MCA small subcortical infarct 1. Does the need for close, 24 hr/day medical supervision in concert with the patient's rehab needs make it unreasonable for  this patient to be served in a less intensive setting? Yes 2. Co-Morbidities requiring supervision/potential complications: severe intracranial large vessel disease, severe diabetes (HgbA1c 14.2), atrial fibrillation on Eliquis, carotid stenosis, hypertension, HLD, baseline mild dementia, anxiety, depression, hypothyroidism 3. Due to bladder management, bowel management, safety, skin/wound care, disease management, medication administration, pain management and patient education, does the patient require 24 hr/day rehab nursing? Yes 4. Does the patient require coordinated care of a physician, rehab nurse, therapy disciplines of PT, OT to address physical and functional deficits in the context of the above medical diagnosis(es)? Yes Addressing deficits in the following areas: balance, endurance, locomotion, strength, transferring, bowel/bladder control, bathing, dressing, feeding, grooming, toileting and psychosocial support 5. Can the patient actively participate in an intensive therapy program of at least 3 hrs of therapy per day at least 5 days per week? Yes 6. The potential for patient to make measurable gains while on inpatient rehab is good 7. Anticipated functional outcomes upon discharge from inpatient rehab are min assist  with PT, modified independent with OT, supervision with SLP. 8. Estimated rehab length of stay to reach the above functional goals is: 10-14 days 9. Anticipated  discharge destination: Home 10. Overall Rehab/Functional Prognosis: good   RECOMMENDATIONS: This patient's condition is appropriate for continued rehabilitative care in the following setting: CIR Patient has agreed to participate in recommended program. Yes Note that insurance prior authorization may be required for reimbursement for recommended care.   Comment: Mrs. Sofia may be a good CIR candidate once medically stable (she may receive revascularization of right ICA later this week). At baseline she was independent in her son's home. She will likely require supervision at home and her son works during the day. He says his sister may be able to assist with supervision during the day.    Lavon Paganini Angiulli, PA-C 08/23/2019    I have personally performed a face to face diagnostic evaluation, including, but not limited to relevant history and physical exam findings, of this patient and developed relevant assessment and plan.  Additionally, I have reviewed and concur with the physician assistant's documentation above.   Leeroy Cha, MD        Revision History                     Routing History                Note Details  Author Ranell Patrick, Clide Deutscher, MD File Time 08/23/2019  3:45 PM  Author Type Physician Status Signed  Last Editor Jaclyn Ribas, MD Service Physical Medicine and Rehabilitation

## 2019-08-31 NOTE — Progress Notes (Signed)
  Progress Note    08/31/2019 7:51 AM 5 Days Post-Op  Subjective: Sleeping soundly, awakens easily.  She states she is ambulating but taking it "carefully".  She states she ate dinner yesterday evening without difficulty swallowing. PT notes reviewed: ambulated in hallway with RW  Vitals:   08/31/19 0013 08/31/19 0510  BP: (!) 130/58 (!) 155/76  Pulse: 73 73  Resp: 13 15  Temp: 98.5 F (36.9 C) 99.5 F (37.5 C)  SpO2: 98% 94%    Physical Exam: Cardiac:  RRR Lungs:  CTAB Incisions:  Right neck incision healing nicely Extremities:  Moves all well. 5/5 grip strength Neuro: A and O x 4; tongue midline, face symmetrical  CBC    Component Value Date/Time   WBC 6.4 08/31/2019 0352   RBC 3.10 (L) 08/31/2019 0352   HGB 9.1 (L) 08/31/2019 0352   HCT 28.3 (L) 08/31/2019 0352   PLT 207 08/31/2019 0352   MCV 91.3 08/31/2019 0352   MCH 29.4 08/31/2019 0352   MCHC 32.2 08/31/2019 0352   RDW 15.0 08/31/2019 0352   LYMPHSABS 1.3 08/23/2019 0425   MONOABS 0.6 08/23/2019 0425   EOSABS 0.1 08/23/2019 0425   BASOSABS 0.0 08/23/2019 0425    BMET    Component Value Date/Time   NA 138 08/31/2019 0352   K 3.2 (L) 08/31/2019 0352   CL 106 08/31/2019 0352   CO2 21 (L) 08/31/2019 0352   GLUCOSE 173 (H) 08/31/2019 0352   BUN 5 (L) 08/31/2019 0352   CREATININE 0.61 08/31/2019 0352   CREATININE 0.81 02/10/2019 1240   CALCIUM 8.1 (L) 08/31/2019 0352   GFRNONAA >60 08/31/2019 0352   GFRAA >60 08/31/2019 0352     Intake/Output Summary (Last 24 hours) at 08/31/2019 0751 Last data filed at 08/30/2019 0900 Gross per 24 hour  Intake 120 ml  Output --  Net 120 ml    HOSPITAL MEDICATIONS Scheduled Meds: . apixaban  5 mg Oral BID  . aspirin EC  81 mg Oral Daily  . atorvastatin  40 mg Oral Daily  . docusate sodium  100 mg Oral Daily  . DULoxetine  60 mg Oral Daily  . insulin aspart  0-15 Units Subcutaneous TID WC  . insulin aspart  0-5 Units Subcutaneous QHS  . insulin aspart  6  Units Subcutaneous TID WC  . insulin detemir  40 Units Subcutaneous BID  . levothyroxine  112 mcg Oral Daily  . linagliptin  5 mg Oral Daily  . midodrine  10 mg Oral TID WC  . pantoprazole  40 mg Oral Daily  . trimethoprim  100 mg Oral Daily  . vitamin B-12  1,000 mcg Oral Daily   Continuous Infusions: . magnesium sulfate bolus IVPB     PRN Meds:.acetaminophen **OR** acetaminophen (TYLENOL) oral liquid 160 mg/5 mL **OR** acetaminophen, alum & mag hydroxide-simeth, guaiFENesin-dextromethorphan, hydrALAZINE, labetalol, magnesium sulfate bolus IVPB, metoprolol tartrate, morphine injection, ondansetron (ZOFRAN) IV, oxyCODONE, phenol, potassium chloride, senna-docusate  Assessment: POD 5 right CEA for symptomatic stenosis. Neuro at baseline from my perspective. (My first encounter of patient.) Known moderate to to severe left carotid stenosis approx 70%.  Re-evaluated post-operatively due to possible aphasia in the setting of confusion. No plans for left CEA this admission.  Neurology following.  Plan: -Continue Pt/OT. Medical management. She will follow-up in the office with Dr. Donnetta Hutching post-discharge. -DVT prophylaxis:  On apixaban   Risa Grill, PA-C [facility] [phone] 08/31/2019  7:51 AM

## 2019-08-31 NOTE — Discharge Summary (Signed)
Physician Discharge Summary  Jaclyn [name] FWY:[id number] DOB: 23-Dec-1935 DOA: 08/21/2019  PCP: Jinny Sanders, MD  Admit date: 08/21/2019 Discharge date: 08/31/2019  Admitted From: Home  Disposition:  CIR   Recommendations for Outpatient Follow-up and new medication changes:  1. Follow up with Dr. Diona Browner in 7 days.  2. Patient will continue aspirin and apixaban for anticoagulation. 3. Placed on midodrine to prevent hypotension, blood pressure target systolic 277 to 412 mmHg due to severe intracranial and extracranial atherosclerosis.   Home Health: na  Equipment/Devices: na    Discharge Condition: stable  CODE STATUS: full  Diet recommendation: heart healthy and diabetic prudent.   Brief/Interim Summary: Patient was admitted to the hospital with a working diagnosis of acute neurologic deficit, CVA/ 6 mm acute ischemic nonhemorrhagic periventricular white matter infarct involving the posterior right frontal corona radiata, centrum semiovale/ severe right carotid stenosis.   84 year old female who presented with left arm pain and weakness for 2 days, associated with increased urinary frequency and hyperglycemia.  She has been noted to be confused, with incoherent speech by her family.  On her initial physical examination blood pressure 132/72, heart rate 68, respirate rate 20, her lungs are clear to auscultation bilaterally, heart S1-S2, present and rhythmic, abdomen soft, no lower extremity edema.  The strength and sensation were diminished in the left upper extremity. Sodium 132, potassium 3.9, chloride 101, bicarb 21, glucose 360, BUN 9, creatinine 0.75, white count 6.7, hemoglobin 9.4, hematocrit 34.3, platelets 197.  SARS COVID-19 negative.  Urinalysis 0-5 white cells, 0-5 red cells.  CT head no acute changes.  EKG 81 bpm, left axis deviation, normal intervals, sinus rhythm, no significant ST segment or T wave changes. Low voltage.  MRI with 6 mm acute ischemic nonhemorrhagic  periventricular white matter infarct involving the posterior right frontal corona radiata, centrum semiovale.  Further work up revealed high grade right carotid stenosis, and underwent carotid endarterectomy on 87/86, complicated with postprocedure hypotension.   Patient placed on midodrine to prevent further hypotension. Plan to transfer to inpatient rehab for further therapy.   1.  Acute ischemic CVA, 6 mm nonhemorrhagic periventricular white matter, posterior right frontal corona radiata, centrum semiovale.  Severe right carotid stenosis.  Patient was admitted to the medical ward, she had neuro checks, along with physical therapy and neurology evaluation.  Further work-up with CT angiography showed progressive atherosclerotic disease, severe stenosis distal left vertebral artery, radiographic string sign stenosis proximal right ICA, left ICA supraclinoid stenosis, moderate to severe, severe bilateral PCA and moderate right MCA stenosis.   Vascular surgery was consulted and patient underwent right carotid endarterectomy with Dacron patch angioplasty on July 9.  Patient had neurologic changes related to hypotension post procedure. Target blood pressure systolic 767 to 209 mmHg, he required 24 of dopamione and then transitioned to midodrine with good toleration. EEG negative for seizures.   Patient will continue with aspirin and apixaban, Continue with atorvastatin.   2. Metabolic encephalopathy/ mild dementia.  Patient experience encephalopathy with delirium while hospitalized. Now clinically it has improved.  3.  Chronic atrial fibrillation.  Patient will continue anticoagulation with apixaban.  4.  Type 2 diabetes mellitus/ uncontrolled Hgb A1c 14.2 . Continue glucose control with insulin and antihyperglycemic agents.   5.  Hypothyroidism. Continue with levothyroxine   6.  Hypertension. Target blood pressure systolic 470 to 962 mmHg.   7.  Fibromyalgia, recurrent urinary tract  infections, B12 deficiency, hypomagnesemia. Continue supportive medical therapy.   I spoke with patient's  son at the bedside, we talked in detail about patient's condition, plan of care and prognosis and all questions were addressed.   Discharge Diagnoses:  Principal Problem:   Acute CVA (cerebrovascular accident) [name] Active Problems:   Hypothyroidism   Poorly controlled type 2 diabetes mellitus with complication ([name]   PAROXYSMAL ATRIAL FIBRILLATION   Stenosis of right carotid artery   Mild dementia ([name]   CVA (cerebral vascular accident) [name]   Dysarthria    Discharge Instructions   Allergies as of 08/31/2019      Reactions   Naproxen Sodium Other (See Comments)   Fever/aches and pains   Statins Other (See Comments)   myalgias   Sulfonamide Derivatives Hives, Itching   Latex Itching, Rash   Shellfish Allergy Itching, Swelling, Rash   Seafood, shrimp      Medication List    STOP taking these medications   DULoxetine 30 MG capsule Commonly known as: CYMBALTA     TAKE these medications   acetaminophen 325 MG tablet Commonly known as: TYLENOL Take 2 tablets (650 mg total) by mouth every 4 (four) hours as needed for mild pain (or temp > 37.5 C (99.5 F)).   apixaban 5 MG Tabs tablet Commonly known as: Eliquis Take 1 tablet (5 mg total) by mouth 2 (two) times daily.   aspirin 81 MG EC tablet Take 1 tablet (81 mg total) by mouth daily. Swallow whole. Start taking on: September 01, 2019   atorvastatin 40 MG tablet Commonly known as: LIPITOR Take 1 tablet (40 mg total) by mouth daily. Start taking on: September 01, 2019   B-D ULTRAFINE III SHORT PEN 31G X 8 MM Misc Generic drug: Insulin Pen Needle USE TO INJECT INSULIN ONCE DAILY. DX: E11.65   Lantus SoloStar 100 UNIT/ML Solostar Pen Generic drug: insulin glargine Inject 65 Units into the skin daily.   levothyroxine 112 MCG tablet Commonly known as: SYNTHROID Take 1 tablet (112 mcg total) by mouth daily.    midodrine 10 MG tablet Commonly known as: PROAMATINE Take 1 tablet (10 mg total) by mouth 3 (three) times daily with meals.   sitaGLIPtin 100 MG tablet Commonly known as: JANUVIA Take 1 tablet (100 mg total) by mouth daily.   trimethoprim 100 MG tablet Commonly known as: TRIMPEX Take 1 tablet (100 mg total) by mouth daily.   vitamin B-12 1000 MCG tablet Commonly known as: CYANOCOBALAMIN Take 1 tablet (1,000 mcg total) by mouth daily.   Vitamin D (Ergocalciferol) 1.25 MG (50000 UNIT) Caps capsule Commonly known as: DRISDOL Take 1 capsule (50,000 Units total) by mouth every 7 (seven) days.       Follow-up Information    Early, Arvilla Meres, MD In 2 weeks.   Specialties: Vascular Surgery, Cardiology Why: The office will call the patient with an appointment(sent) Contact information: [address]  [place] [address] [phone]              Allergies  Allergen Reactions  . Naproxen Sodium Other (See Comments)    Fever/aches and pains  . Statins Other (See Comments)    myalgias  . Sulfonamide Derivatives Hives and Itching  . Latex Itching and Rash  . Shellfish Allergy Itching, Swelling and Rash    Seafood, shrimp    Consultations:  Neurology   Vascular surgery    Procedures/Studies: CT ANGIO HEAD W OR WO CONTRAST  Result Date: 08/29/2019 CLINICAL DATA:  84 year old female with neurologic deficit. Small right hemisphere white matter lacunar infarct on MRI 08/21/2019,  and also recent CTA head and neck with extensive atherosclerosis which have progressed since 2019. EXAM: CT ANGIOGRAPHY HEAD AND NECK TECHNIQUE: Multidetector CT imaging of the head and neck was performed using the standard protocol during bolus administration of intravenous contrast. Multiplanar CT image reconstructions and MIPs were obtained to evaluate the vascular anatomy. Carotid stenosis measurements (when applicable) are obtained utilizing NASCET criteria, using the distal internal carotid  diameter as the denominator. CONTRAST:  73mL OMNIPAQUE IOHEXOL 350 MG/ML SOLN COMPARISON:  CTA head and neck 08/22/2019. FINDINGS: CT HEAD Brain: Calcified atherosclerosis at the skull base. Stable gray-white matter differentiation throughout the brain. Patchy and confluent bilateral white matter hypodensity has not significantly changed. No midline shift, ventriculomegaly, mass effect, evidence of mass lesion, intracranial hemorrhage or evidence of cortically based acute infarction. Calvarium and skull base: No acute osseous abnormality identified. Paranasal sinuses: Visualized paranasal sinuses and mastoids are stable and well pneumatized. Orbits: No acute orbit or scalp soft tissue finding. CTA NECK Skeleton: Stable.  No acute osseous abnormality identified. Upper chest: New small to moderate bilateral layering pleural effusions. Stable apical lung scarring. No superior mediastinal lymphadenopathy. Visible central pulmonary arteries appear patent. Other neck: New postoperative changes to the right lower neck, see right carotid findings below. Trace postoperative gas in the right neck. No rim enhancing or drainable fluid collection. Aortic arch: Stable 3 vessel arch configuration an arch atherosclerosis. Right carotid system: Stable brachiocephalic artery plaque without stenosis. Mild right CCA plaque without stenosis proximal to the bifurcation. New right carotid endarterectomy since 08/22/2019, with widely patent right carotid bifurcation and cervical right ICA now to the skull base. Left carotid system: Stable since 08/22/2019. Complex plaque at the proximal left ICA with an estimated 70 % stenosis with respect to the distal vessel (series 9, image 105). Stable tortuosity of the left ICA just distal to the bulb. Vertebral arteries: Stable on the right with no hemodynamically significant stenosis, dominant right vertebral artery. But more apparent on these images today is high-grade stenosis of the proximal left  subclavian artery due to bulky calcified plaque approaching a radiographic string sign on series 9, image 154. Stable calcified plaque at the non dominant left vertebral artery origin with only mild stenosis. Stable non dominant left vertebral artery to the skull base. CTA HEAD Posterior circulation: Dominant right vertebral artery supplies the basilar as before with intermittent mild plaque and no significant stenosis. Patent right PICA origin. Tandem severe stenoses of the distal left V4 segment are unchanged. Stable irregular basilar artery without high-grade stenosis. Stable PCA and SCA origins with severe bilateral P1/P2 stenoses. Bilateral PCA branches appears stable. Anterior circulation: Both ICA siphons remain patent and heavily calcified. Moderate to severe bilateral siphon stenosis appears stable, including in the supraclinoid segment on series 13, image 98. Stable carotid termini, MCA and ACA origins. ACAs remain normal aside from mild irregularity. Mild to moderate bilateral MCA M1 stenoses are stable. Additional bilateral MCA branch irregularity again noted and stable from the recent CTA. Venous sinuses: Early contrast timing, not well evaluated today. Anatomic variants: Unchanged. Review of the MIP images confirms the above findings IMPRESSION: 1. Right Carotid Endarterectomy since the recent CTA on 08/22/2019 with no adverse features. 2. Also, high-grade stenosis of the proximal Left Subclavian Artery is more apparent today (series 9, image 154), approaching a Radiographic-String-Sign. The non dominant left vertebral artery is stable. 3. Otherwise stable extensive atherosclerosis and stenosis in the head and neck as described on 08/22/2019, most notable for - Severe  stenosis of the Left vertebral Artery distal V4 segment. - Left ICA 70% stenosis in the neck. - Moderate to severe Bilateral ICA siphon stenosis. - Severe bilateral PCA P1/P2 stenoses. - Moderate Right MCA distal M1 stenosis. 4. Continued  stable non contrast CT appearance of the brain. No new intracranial abnormality. 5. New small to moderate bilateral layering pleural effusions. Electronically Signed   By: Genevie Ann M.D.   On: 08/29/2019 03:32   CT Head Wo Contrast  Result Date: 08/21/2019 CLINICAL DATA:  Neuro deficits. Subacute arm weakness. Increasingly confused since Friday. EXAM: CT HEAD WITHOUT CONTRAST TECHNIQUE: Contiguous axial images were obtained from the base of the skull through the vertex without intravenous contrast. COMPARISON:  01/24/2019 FINDINGS: Brain: Moderate central and cortical atrophy. Periventricular white matter changes are consistent with small vessel disease. Vascular: Dense atherosclerotic calcification of the internal carotid arteries. No hyperdense vessels. Skull: Normal. Negative for fracture or focal lesion. Sinuses/Orbits: No acute finding. Other: None. IMPRESSION: 1. No evidence for acute intracranial abnormality. 2. Atrophy and small vessel disease. Electronically Signed   By: Nolon Nations M.D.   On: 08/21/2019 20:58   CT ANGIO NECK W OR WO CONTRAST  Result Date: 08/29/2019 CLINICAL DATA:  84 year old female with neurologic deficit. Small right hemisphere white matter lacunar infarct on MRI 08/21/2019, and also recent CTA head and neck with extensive atherosclerosis which have progressed since 2019. EXAM: CT ANGIOGRAPHY HEAD AND NECK TECHNIQUE: Multidetector CT imaging of the head and neck was performed using the standard protocol during bolus administration of intravenous contrast. Multiplanar CT image reconstructions and MIPs were obtained to evaluate the vascular anatomy. Carotid stenosis measurements (when applicable) are obtained utilizing NASCET criteria, using the distal internal carotid diameter as the denominator. CONTRAST:  3mL OMNIPAQUE IOHEXOL 350 MG/ML SOLN COMPARISON:  CTA head and neck 08/22/2019. FINDINGS: CT HEAD Brain: Calcified atherosclerosis at the skull base. Stable gray-white  matter differentiation throughout the brain. Patchy and confluent bilateral white matter hypodensity has not significantly changed. No midline shift, ventriculomegaly, mass effect, evidence of mass lesion, intracranial hemorrhage or evidence of cortically based acute infarction. Calvarium and skull base: No acute osseous abnormality identified. Paranasal sinuses: Visualized paranasal sinuses and mastoids are stable and well pneumatized. Orbits: No acute orbit or scalp soft tissue finding. CTA NECK Skeleton: Stable.  No acute osseous abnormality identified. Upper chest: New small to moderate bilateral layering pleural effusions. Stable apical lung scarring. No superior mediastinal lymphadenopathy. Visible central pulmonary arteries appear patent. Other neck: New postoperative changes to the right lower neck, see right carotid findings below. Trace postoperative gas in the right neck. No rim enhancing or drainable fluid collection. Aortic arch: Stable 3 vessel arch configuration an arch atherosclerosis. Right carotid system: Stable brachiocephalic artery plaque without stenosis. Mild right CCA plaque without stenosis proximal to the bifurcation. New right carotid endarterectomy since 08/22/2019, with widely patent right carotid bifurcation and cervical right ICA now to the skull base. Left carotid system: Stable since 08/22/2019. Complex plaque at the proximal left ICA with an estimated 70 % stenosis with respect to the distal vessel (series 9, image 105). Stable tortuosity of the left ICA just distal to the bulb. Vertebral arteries: Stable on the right with no hemodynamically significant stenosis, dominant right vertebral artery. But more apparent on these images today is high-grade stenosis of the proximal left subclavian artery due to bulky calcified plaque approaching a radiographic string sign on series 9, image 154. Stable calcified plaque at the non dominant left  vertebral artery origin with only mild stenosis.  Stable non dominant left vertebral artery to the skull base. CTA HEAD Posterior circulation: Dominant right vertebral artery supplies the basilar as before with intermittent mild plaque and no significant stenosis. Patent right PICA origin. Tandem severe stenoses of the distal left V4 segment are unchanged. Stable irregular basilar artery without high-grade stenosis. Stable PCA and SCA origins with severe bilateral P1/P2 stenoses. Bilateral PCA branches appears stable. Anterior circulation: Both ICA siphons remain patent and heavily calcified. Moderate to severe bilateral siphon stenosis appears stable, including in the supraclinoid segment on series 13, image 98. Stable carotid termini, MCA and ACA origins. ACAs remain normal aside from mild irregularity. Mild to moderate bilateral MCA M1 stenoses are stable. Additional bilateral MCA branch irregularity again noted and stable from the recent CTA. Venous sinuses: Early contrast timing, not well evaluated today. Anatomic variants: Unchanged. Review of the MIP images confirms the above findings IMPRESSION: 1. Right Carotid Endarterectomy since the recent CTA on 08/22/2019 with no adverse features. 2. Also, high-grade stenosis of the proximal Left Subclavian Artery is more apparent today (series 9, image 154), approaching a Radiographic-String-Sign. The non dominant left vertebral artery is stable. 3. Otherwise stable extensive atherosclerosis and stenosis in the head and neck as described on 08/22/2019, most notable for - Severe stenosis of the Left vertebral Artery distal V4 segment. - Left ICA 70% stenosis in the neck. - Moderate to severe Bilateral ICA siphon stenosis. - Severe bilateral PCA P1/P2 stenoses. - Moderate Right MCA distal M1 stenosis. 4. Continued stable non contrast CT appearance of the brain. No new intracranial abnormality. 5. New small to moderate bilateral layering pleural effusions. Electronically Signed   By: Genevie Ann M.D.   On: 08/29/2019 03:32    MR BRAIN WO CONTRAST  Result Date: 08/29/2019 CLINICAL DATA:  Stroke, follow-up. EXAM: MRI HEAD WITHOUT CONTRAST TECHNIQUE: Multiplanar, multiecho pulse sequences of the brain and surrounding structures were obtained without intravenous contrast. COMPARISON:  CT angiogram head/neck 08/29/2019, CT angiogram head/neck 08/22/2019, brain MRI 08/21/2019 FINDINGS: Brain: At the provider's request, a limited protocol MRI was performed. Only axial and coronal diffusion-weighted sequences, as well as an axial T2/FLAIR sequence, were obtained. Redemonstrated is a 6 mm, now subacute, infarct within the posterior right frontal lobe periventricular white matter (series 2, image 35). However new as compared to the MRI of 08/21/2019, there are small acute/early subacute cortical infarcts within the right pre and postcentral gyri measuring up to 10 mm (series 2, image 42) (series 2, image 41). Also new from this prior examination, there is a punctate acute/early subacute infarct within the Peri ventricle right parietooccipital lobes (series 2, image 24). Stable background moderate patchy T2/FLAIR hyperintensity within the cerebral white matter and pons which is nonspecific, but consistent with chronic small vessel ischemic disease. Redemonstrated chronic lacunar infarct within the anterior right external capsule. Stable, mild generalized parenchymal atrophy. Vascular: Flow voids poorly assessed on the acquired sequences. Skull and upper cervical spine: No focal marrow lesion is identified on the acquired sequences. Sinuses/Orbits: No acute orbital abnormality identified on the acquired sequences. Mild ethmoid sinus mucosal thickening. IMPRESSION: Limited protocol brain MRI as described. Small (measuring up to 10 mm) acute/early subacute cortical infarcts within the right pre and postcentral gyri, new as compared to prior MRI 08/21/2019. Also new from this prior exam, there is a punctate acute/early subacute infarct within the  periventricular right frontoparietal lobes. Redemonstrated 6 mm, now subacute, infarct within the posterior right frontal lobe  periventricular white matter. Stable background mild generalized parenchymal atrophy and moderate chronic small vessel ischemic disease. Mild ethmoid sinus mucosal thickening. Electronically Signed   By: Kellie Simmering DO   On: 08/29/2019 09:14   MR BRAIN WO CONTRAST  Result Date: 08/21/2019 CLINICAL DATA:  Initial evaluation for increased confusion, focal neural deficit. EXAM: MRI HEAD WITHOUT CONTRAST TECHNIQUE: Multiplanar, multiecho pulse sequences of the brain and surrounding structures were obtained without intravenous contrast. COMPARISON:  Prior head CT from earlier the same day. FINDINGS: Brain: Examination mildly degraded by motion artifact. Generalized age-related cerebral atrophy. Patchy and confluent T2/FLAIR hyperintensity within the periventricular and deep white matter of both cerebral hemispheres most consistent with chronic small vessel ischemic disease, moderate in nature. Patchy involvement of the pons noted. 6 mm acute ischemic infarcts seen involving the periventricular white matter of the posterior right frontal corona radiata/centrum semi ovale (series 5, image 83). No associated hemorrhage or mass effect. No other evidence for acute or subacute ischemia. Gray-white matter differentiation otherwise maintained. No encephalomalacia to suggest chronic cortical infarction. No acute intracranial hemorrhage. Single chronic microhemorrhage noted at the inferior cerebellum, likely hypertensive in nature. No other evidence for chronic intracranial hemorrhage. No mass lesion, midline shift or mass effect. Diffuse ventricular prominence most likely related to global parenchymal volume loss without hydrocephalus. No extra-axial fluid collection. Pituitary gland suprasellar region within normal limits. Midline structures intact. Vascular: Major intracranial vascular flow voids  are maintained. Skull and upper cervical spine: Craniocervical junction within normal limits. Bone marrow signal intensity normal. No scalp soft tissue abnormality. Sinuses/Orbits: Patient status post ocular lens replacement on the left. Globes and orbital soft tissues demonstrate no acute finding. Mild scattered mucosal thickening noted throughout the ethmoidal air cells and maxillary sinuses. Trace bilateral mastoid effusions noted, of doubtful significance. Visualized nasopharynx within normal limits. Other: None. IMPRESSION: 1. 6 mm acute ischemic nonhemorrhagic periventricular white matter infarct involving the posterior right frontal corona radiata/centrum semi ovale. 2. No other acute intracranial abnormality. 3. Age-related cerebral atrophy with moderate chronic microvascular ischemic disease. Electronically Signed   By: Jeannine Boga M.D.   On: 08/21/2019 23:44   CT CORONARY MORPH W/CTA COR W/SCORE W/CA W/CM &/OR WO/CM  Addendum Date: 08/24/2019   ADDENDUM REPORT: 08/24/2019 20:28 EXAM: CT FFR ANALYSIS CLINICAL DATA:  84 year old female with abnormal coronary CTA FINDINGS: FFRct analysis was performed on the original cardiac CT angiogram dataset. Diagrammatic representation of the FFRct analysis is provided in a separate PDF document in PACS. This dictation was created using the PDF document and an interactive 3D model of the results. 3D model is not available in the EMR/PACS. Normal FFR range is >0.80. 1. Left Main: 0.99. 2. LAD: Proximal: 0.97, mid 0.82, distal: Small lumen < 2 mm. 3. D1: 0.77. 4. LCX: 0.94. 5. RCA: CT FFR not available secondary to motion. IMPRESSION: 1. CT FFR analysis didn't show any significant stenoses in left main or proximal/mid LAD. CT FFR is not available for RCA secondary to motion. Aggressive medical management with high dose statin, aspirin and beta-blockers is recommended. Electronically Signed   By: Ena Dawley   On: 08/24/2019 20:28   Addendum Date:  08/24/2019   ADDENDUM REPORT: 08/24/2019 19:19 CLINICAL DATA:  84 year old female with h/o symptomatic carotid stenosis scheduled for an endarterectomy. EXAM: Cardiac/Coronary  CTA TECHNIQUE: The patient was scanned on a [employer]. FINDINGS: A 100 kV prospective scan was triggered in the descending thoracic aorta at 111 HU's. Axial non-contrast 3 mm slices  were carried out through the heart. The data set was analyzed on a dedicated work station and scored using the [place]. Gantry rotation speed was 250 msecs and collimation was .6 mm. 50 mg of PO Metoprolol and 0.8 mg of sl NTG was given. The 3D data set was reconstructed in 5% intervals of the 67-82 % of the R-R cycle. Diastolic phases were analyzed on a dedicated work station using MPR, MIP and VRT modes. The patient received 80 cc of contrast. Aorta: Normal size. Moderate diffuse atherosclerotic disease and calcifications. No dissection. Aortic Valve: Trileaflet with minimal calcifications. Coronary Arteries:  Normal coronary origin.  Right dominance. RCA is a medium caliber dominant artery that gives rise to PDA and PLA. There is mild calcified plaque in the proximal and mid portion with stenosis 25-49%. Distal RCA, PDA and PLA have minimal plaque. Left main is a large artery that gives rise to LAD and LCX arteries. Left main has a moderate calcified plaque in the distal portion with stenosis 50-69%. LAD is a large vessel that gives rise to one small diagonal artery. Proximal LAD has moderate mixed plaque with stenosis 50-69%. Mid and distal LAD are affected by motion. D1 is a small artery affected by motion. LCX is a small lumen non-dominant artery that gives rise to one small OM1 branch. OM1 has moderate calcified plaque in the proximal portion with stenosis 50-69%. Other findings: Normal pulmonary vein drainage into the left atrium. Normal left atrial appendage without a thrombus. Normal size of the pulmonary artery. IMPRESSION: 1.  Coronary calcium score of 663. This was 63 percentile for age and sex matched control. 2. Normal coronary origin with right dominance. 3. CAD-RADS 3. Moderate stenosis. Study interpretation is affected by motion and poor vasodilation. There is moderate plaque in the distal left main and proximal LAD. Aggressive medical management is recommended. Additional analysis with CT FFR will be submitted. Electronically Signed   By: Ena Dawley   On: 08/24/2019 19:19   Result Date: 08/24/2019 EXAM: OVER-READ INTERPRETATION  CT CHEST The following report is an over-read performed by radiologist Dr. Suzy Bouchard of [facility], [place] on 08/24/2019. This over-read does not include interpretation of cardiac or coronary anatomy or pathology. The coronary CTA interpretation by the cardiologist is attached. COMPARISON:  None. FINDINGS: Limited view of the lung parenchyma demonstrates no suspicious nodularity. Airways are normal. Limited view of the mediastinum demonstrates no adenopathy. Esophagus normal. Limited view of the upper abdomen unremarkable. Limited view of the skeleton and chest wall is unremarkable. IMPRESSION: No significant extracardiac findings. Electronically Signed: By: Suzy Bouchard M.D. On: 08/24/2019 14:24   EEG adult  Result Date: 08/30/2019 [name], MD     08/30/2019  4:31 PM Patient Name: Jaclyn [name] MRN: [id number] Epilepsy Attending: [name] Referring Physician/Provider: Dr Rosalin Hawking Date: 08/30/2019 Duration: 24.27 mins Patient history: 84yo F with acute right hemispheric strokes and episodes of transient aphasia. EEG to evaluate for seizure Level of alertness: Awake, asleep AEDs during EEG study: None Technical aspects: This EEG study was done with scalp electrodes positioned according to the 10-20 International system of electrode placement. Electrical activity was acquired at a sampling rate of 500Hz  and reviewed with a high frequency filter of 70Hz  and a low  frequency filter of 1Hz . EEG data were recorded continuously and digitally stored. Description: The posterior dominant rhythm consists of 7.5-8 Hz activity of moderate voltage (25-35 uV) seen predominantly in posterior head regions, symmetric and reactive to  eye opening and eye closing. Sleep was characterized by vertex waves, sleep spindles (12 to 14 Hz), maximal frontocentral region. Hyperventilation and photic stimulation were not performed.   IMPRESSION: This study is within normal limits. No seizures or epileptiform discharges were seen throughout the recording. [name]   EEG adult  Result Date: 08/22/2019 [name], MD     08/22/2019  3:21 PM ELECTROENCEPHALOGRAM REPORT Patient: Jaclyn [name]       Room #: 3J03E EEG No. ID: [id number] Age: 84 y.o.        Sex: female Requesting Physician: Florene Glen Report Date:  08/22/2019       Interpreting Physician: [name] History: Kaylen Motl is an 84 y.o. female with altered mental status Medications: Eliquis, Lipitor, Insulin, Synthroid, Tradjenta Conditions of Recording:  This is a 21 channel routine scalp EEG performed with bipolar and monopolar montages arranged in accordance to the international 10/20 system of electrode placement. One channel was dedicated to EKG recording. The patient is in the awake and drowsy states. Description:  The patient is uncooperative and muscle and movement artifact are prominent during the recording.  When a waking background activity can be evaluated it consists of a low voltage, symmetrical, fairly well organized, 8 Hz alpha activity, seen from the parieto-occipital and posterior temporal regions.  Low voltage fast activity, poorly organized, is seen anteriorly and is at times superimposed on more posterior regions.  A mixture of theta and alpha rhythms are seen from the central and temporal regions. The patient drowses with slowing to irregular, low voltage theta and beta activity.  Stage II sleep is not  obtained. No epileptiform activity is noted.  Hyperventilation and intermittent photic stimulation were not performed. IMPRESSION: Normal electroencephalogram, awake and drowsy. There are no focal lateralizing or epileptiform features. [name], MD Neurology [phone] 08/22/2019, 3:17 PM   ECHOCARDIOGRAM COMPLETE  Result Date: 08/22/2019    ECHOCARDIOGRAM REPORT   Patient Name:   Jaclyn [name] Date of Exam: 08/22/2019 Medical Rec #:  [id number]       Height:       62.0 in Accession #:    [id number]      Weight:       145.5 lb Date of Birth:  14-Mar-1935       BSA:          1.670 m Patient Age:    68 years        BP:           127/66 mmHg Patient Gender: F               HR:           70 bpm. Exam Location:  Inpatient Procedure: 2D Echo, Color Doppler and Cardiac Doppler Indications:    Stroke i163.9  History:        Patient has prior history of Echocardiogram examinations, most                 recent 06/21/2017. Arrythmias:Atrial Fibrillation; Risk                 Factors:Hypertension, Diabetes and Dyslipidemia. COVID+ on                 01/15/19.  Sonographer:    [name] Senior RDCS Referring Phys: [id number] Hecker  1. Left ventricular ejection fraction, by estimation, is 65 to 70%. The left ventricle has normal function. The left ventricle has no regional wall motion abnormalities. There is  moderate left ventricular hypertrophy. Left ventricular diastolic parameters are indeterminate.  2. Right ventricular systolic function is normal. The right ventricular size is normal. Mildly increased right ventricular wall thickness. Tricuspid regurgitation signal is inadequate for assessing PA pressure.  3. The mitral valve is grossly normal, mild annular calcification. Trivial mitral valve regurgitation.  4. The aortic valve is tricuspid, mild annular calcification. Aortic valve regurgitation is not visualized.  5. The inferior vena cava is normal in size with greater than 50% respiratory  variability, suggesting right atrial pressure of 3 mmHg. FINDINGS  Left Ventricle: Left ventricular ejection fraction, by estimation, is 65 to 70%. The left ventricle has normal function. The left ventricle has no regional wall motion abnormalities. The left ventricular internal cavity size was small. There is moderate  left ventricular hypertrophy. Left ventricular diastolic parameters are indeterminate. Right Ventricle: The right ventricular size is normal. Mildly increased right ventricular wall thickness. Right ventricular systolic function is normal. Tricuspid regurgitation signal is inadequate for assessing PA pressure. Left Atrium: Left atrial size was normal in size. Right Atrium: Right atrial size was normal in size. Pericardium: Trivial pericardial effusion is present. The pericardial effusion is posterior to the left ventricle. Presence of pericardial fat pad. Mitral Valve: The mitral valve is grossly normal. Mild mitral annular calcification. Trivial mitral valve regurgitation. Tricuspid Valve: The tricuspid valve is grossly normal. Tricuspid valve regurgitation is trivial. Aortic Valve: The aortic valve is tricuspid. Aortic valve regurgitation is not visualized. Mild aortic valve annular calcification. Pulmonic Valve: The pulmonic valve was grossly normal. Pulmonic valve regurgitation is trivial. Aorta: The aortic root is normal in size and structure. Venous: The inferior vena cava is normal in size with greater than 50% respiratory variability, suggesting right atrial pressure of 3 mmHg. IAS/Shunts: No atrial level shunt detected by color flow Doppler.  LEFT VENTRICLE PLAX 2D LVIDd:         2.60 cm  Diastology LVIDs:         1.60 cm  LV e' lateral:   4.90 cm/s LV PW:         1.50 cm  LV E/e' lateral: 15.6 LV IVS:        1.30 cm  LV e' medial:    5.66 cm/s LVOT diam:     1.60 cm  LV E/e' medial:  13.5 LV SV:         36 LV SV Index:   22 LVOT Area:     2.01 cm  RIGHT VENTRICLE RV S prime:     12.00 cm/s  TAPSE (M-mode): 1.7 cm LEFT ATRIUM             Index       RIGHT ATRIUM           Index LA diam:        1.70 cm 1.02 cm/m  RA Area:     10.30 cm LA Vol (A2C):   28.0 ml 16.77 ml/m RA Volume:   22.40 ml  13.41 ml/m LA Vol (A4C):   27.4 ml 16.41 ml/m LA Biplane Vol: 28.5 ml 17.07 ml/m  AORTIC VALVE LVOT Vmax:   76.30 cm/s LVOT Vmean:  54.100 cm/s LVOT VTI:    0.179 m  AORTA Ao Root diam: 3.10 cm Ao Asc diam:  3.00 cm MITRAL VALVE MV Area (PHT): 1.91 cm    SHUNTS MV Decel Time: 398 msec    Systemic VTI:  0.18 m MV E velocity: 76.40 cm/s  Systemic Diam: 1.60  cm MV A velocity: 90.00 cm/s MV E/A ratio:  0.85 Rozann Lesches MD Electronically signed by Rozann Lesches MD Signature Date/Time: 08/22/2019/11:43:40 AM    Final    VAS US CAROTID  Result Date: 08/24/2019 Carotid Arterial Duplex Study Indications:       CVA. Risk Factors:      Hypertension, hyperlipidemia, Diabetes. Comparison Study:  06-20-2017 Carotid duplex RT ICA 60-79%, LT ICA 1-39% Performing Technologist: Darlin Coco  Examination Guidelines: A complete evaluation includes B-mode imaging, spectral Doppler, color Doppler, and power Doppler as needed of all accessible portions of each vessel. Bilateral testing is considered an integral part of a complete examination. Limited examinations for reoccurring indications may be performed as noted.  Right Carotid Findings: +----------+--------+--------+--------+------------------+--------+           PSV cm/sEDV cm/sStenosisPlaque DescriptionComments +----------+--------+--------+--------+------------------+--------+ CCA Prox  97      13              heterogenous               +----------+--------+--------+--------+------------------+--------+ CCA Distal154     19              heterogenous               +----------+--------+--------+--------+------------------+--------+ ICA Prox  375     121     80-99%  calcific                    +----------+--------+--------+--------+------------------+--------+ ICA Mid   154     36                                         +----------+--------+--------+--------+------------------+--------+ ICA Distal64      18                                         +----------+--------+--------+--------+------------------+--------+ ECA       510             >50%                               +----------+--------+--------+--------+------------------+--------+ +----------+--------+-------+----------------+-------------------+           PSV cm/sEDV cmsDescribe        Arm Pressure (mmHG) +----------+--------+-------+----------------+-------------------+ Subclavian129            Multiphasic, WNL                    +----------+--------+-------+----------------+-------------------+ +---------+--------+--+--------+-+---------+ VertebralPSV cm/s34EDV cm/s9Antegrade +---------+--------+--+--------+-+---------+  Left Carotid Findings: +----------+--------+--------+--------+------------------+--------+           PSV cm/sEDV cm/sStenosisPlaque DescriptionComments +----------+--------+--------+--------+------------------+--------+ CCA Prox  121     14              heterogenous               +----------+--------+--------+--------+------------------+--------+ CCA Distal143     20              heterogenous               +----------+--------+--------+--------+------------------+--------+ ICA Prox  133     48      40-59%  calcific                   +----------+--------+--------+--------+------------------+--------+ ICA  Mid   119     28                                         +----------+--------+--------+--------+------------------+--------+ ICA Distal74      27                                         +----------+--------+--------+--------+------------------+--------+ ECA       188                                                 +----------+--------+--------+--------+------------------+--------+ +----------+--------+--------+----------------+-------------------+           PSV cm/sEDV cm/sDescribe        Arm Pressure (mmHG) +----------+--------+--------+----------------+-------------------+ [id number]              Multiphasic, WNL                    +----------+--------+--------+----------------+-------------------+ +---------+--------+--+--------+-+---------+ VertebralPSV cm/s47EDV cm/s6Antegrade +---------+--------+--+--------+-+---------+   Summary: Right Carotid: Velocities in the right ICA are consistent with a 80-99%                stenosis. Non-hemodynamically significant plaque <50% noted in                the CCA. The ECA appears >50% stenosed. Left Carotid: Velocities in the left ICA are consistent with a 40-59% stenosis.               Non-hemodynamically significant plaque <50% noted in the CCA. Vertebrals:  Bilateral vertebral arteries demonstrate antegrade flow. Subclavians: Normal flow hemodynamics were seen in bilateral subclavian              arteries. *See table(s) above for measurements and observations.  Electronically signed by Antony Contras MD on 08/24/2019 at 12:53:57 PM.    Final    CT ANGIO HEAD CODE STROKE  Result Date: 08/22/2019 CLINICAL DATA:  84 year old female with a small acute right superior frontal white matter lacunar infarct after presenting with arm weakness and confusion. EXAM: CT ANGIOGRAPHY HEAD AND NECK TECHNIQUE: Multidetector CT imaging of the head and neck was performed using the standard protocol during bolus administration of intravenous contrast. Multiplanar CT image reconstructions and MIPs were obtained to evaluate the vascular anatomy. Carotid stenosis measurements (when applicable) are obtained utilizing NASCET criteria, using the distal internal carotid diameter as the denominator. CONTRAST:  37mL OMNIPAQUE IOHEXOL 350 MG/ML SOLN COMPARISON:  Brain MRI and plain head CT  08/21/2019. CTA head and neck and CTP 06/20/2017. FINDINGS: CTA NECK Skeleton: Carious dentition. Chronic cervical spine degeneration. No acute osseous abnormality identified. Upper chest: Chronic apical lung scarring is stable. No superior mediastinal lymphadenopathy. Unchanged borderline to mild circumferential wall thickening of the upper thoracic esophagus. Other neck: Negative. Aortic arch: 3 vessel arch configuration. Stable mild distal arch calcified plaque. Right carotid system: Negative brachiocephalic artery and proximal right CCA. Mild plaque proximal to the bifurcation without stenosis. Bulky chronic mostly calcified plaque at the right ICA origin and bulb resulting in radiographic string sign stenosis (series 5, image 94) over the initial 10 mm segment of the vessel. This appears mildly progressed. The right ICA remains  patent and is otherwise negative to the skull base. Left carotid system: Minimal plaque at the left CCA origin without stenosis. Mild plaque proximal to the bifurcation without stenosis. Chronic bulky calcified plaque at the left ICA origin resulting in stenosis numerically estimated at 70% with respect to the distal vessel (series 9, image 126). This is stable. Left ICA is tortuous and remains patent distal to the stenosis. Vertebral arteries: Calcified proximal right subclavian artery plaque without stenosis. Dominant right vertebral artery is mildly ectatic without plaque or stenosis to the skull base. Moderate calcified plaque in the proximal left subclavian artery with up to 50% stenosis appears stable. Mild calcified plaque at the non dominant left vertebral artery origin not resulting in stenosis. The left vertebral remains non dominant to the skull base without stenosis. CTA HEAD Posterior circulation: Dominant right vertebral artery primarily supplies the basilar with minimal calcified plaque and no stenosis. Normal right PICA origin. Left V4 segment is non dominant with new  severe stenosis or short segment occlusion just proximal to the vertebrobasilar junction on series 11, image 20. This is about 3 mm in length. The basilar artery is patent but newly diminutive since 2019 with multifocal irregularity. Only mild focal basilar artery stenosis results. The SCA and PCA origins remain patent. Chronic bilateral PCA atherosclerosis has progressed now with severe bilateral P1 and P2 segment stenoses (series 11, images 17 and 18). Despite this there is distal bilateral PCA enhancement. Anterior circulation: Both ICA siphons are patent but heavily calcified. On the left there is severe supraclinoid stenosis on series 8, image 63, mildly progressed since 2019. On the right there is moderate to severe cavernous and proximal supraclinoid stenosis due to bulky calcified plaque which appears stable. Carotid termini remain patent. Mild-to-moderate bilateral chronic proximal MCA stenosis has not significantly changed. A1 segments remain normal. Mild bilateral ACA branch irregularity is stable. Distal right M1 stenosis is moderate and increased (series 10, image 16). Left MCA bifurcation remains patent, with stable left MCA branches demonstrating multifocal mild irregularity. Right MCA bifurcation remains patent with stable right MCA branches demonstrating multifocal mild irregularity. Venous sinuses: Patent. Anatomic variants: Dominant right vertebral artery. Review of the MIP images confirms the above findings IMPRESSION: 1. Negative for large vessel occlusion. 2. But positive for chronic severe atherosclerosis which has progressed since a 2019 CTA as follows: - New Severe stenosis or Short Segment Occlusion of the distal Left Vertebral Artery V4 segment. - New diminutive caliber of the Basilar Artery which remains patent. - RADIOGRAPHIC STRING SIGN stenosis of the proximal Right ICA has progressed. Chronic moderate to severe Left ICA siphon stenosis is stable. - progressed Left ICA supraclinoid  stenosis which is now moderate to severe. Stable proximal left Left 70% stenosis. - progressed and Severe bilateral PCA P1/P2 stenoses. - progressed and moderate Right MCA distal M1 stenosis. 3. Stable moderate bilateral proximal MCA stenoses. Stable proximal left subclavian artery plaque with 50% stenosis. Electronically Signed   By: Genevie Ann M.D.   On: 08/22/2019 11:59   CT ANGIO NECK CODE STROKE  Result Date: 08/22/2019 CLINICAL DATA:  84 year old female with a small acute right superior frontal white matter lacunar infarct after presenting with arm weakness and confusion. EXAM: CT ANGIOGRAPHY HEAD AND NECK TECHNIQUE: Multidetector CT imaging of the head and neck was performed using the standard protocol during bolus administration of intravenous contrast. Multiplanar CT image reconstructions and MIPs were obtained to evaluate the vascular anatomy. Carotid stenosis measurements (when applicable) are obtained utilizing  NASCET criteria, using the distal internal carotid diameter as the denominator. CONTRAST:  59mL OMNIPAQUE IOHEXOL 350 MG/ML SOLN COMPARISON:  Brain MRI and plain head CT 08/21/2019. CTA head and neck and CTP 06/20/2017. FINDINGS: CTA NECK Skeleton: Carious dentition. Chronic cervical spine degeneration. No acute osseous abnormality identified. Upper chest: Chronic apical lung scarring is stable. No superior mediastinal lymphadenopathy. Unchanged borderline to mild circumferential wall thickening of the upper thoracic esophagus. Other neck: Negative. Aortic arch: 3 vessel arch configuration. Stable mild distal arch calcified plaque. Right carotid system: Negative brachiocephalic artery and proximal right CCA. Mild plaque proximal to the bifurcation without stenosis. Bulky chronic mostly calcified plaque at the right ICA origin and bulb resulting in radiographic string sign stenosis (series 5, image 94) over the initial 10 mm segment of the vessel. This appears mildly progressed. The right ICA  remains patent and is otherwise negative to the skull base. Left carotid system: Minimal plaque at the left CCA origin without stenosis. Mild plaque proximal to the bifurcation without stenosis. Chronic bulky calcified plaque at the left ICA origin resulting in stenosis numerically estimated at 70% with respect to the distal vessel (series 9, image 126). This is stable. Left ICA is tortuous and remains patent distal to the stenosis. Vertebral arteries: Calcified proximal right subclavian artery plaque without stenosis. Dominant right vertebral artery is mildly ectatic without plaque or stenosis to the skull base. Moderate calcified plaque in the proximal left subclavian artery with up to 50% stenosis appears stable. Mild calcified plaque at the non dominant left vertebral artery origin not resulting in stenosis. The left vertebral remains non dominant to the skull base without stenosis. CTA HEAD Posterior circulation: Dominant right vertebral artery primarily supplies the basilar with minimal calcified plaque and no stenosis. Normal right PICA origin. Left V4 segment is non dominant with new severe stenosis or short segment occlusion just proximal to the vertebrobasilar junction on series 11, image 20. This is about 3 mm in length. The basilar artery is patent but newly diminutive since 2019 with multifocal irregularity. Only mild focal basilar artery stenosis results. The SCA and PCA origins remain patent. Chronic bilateral PCA atherosclerosis has progressed now with severe bilateral P1 and P2 segment stenoses (series 11, images 17 and 18). Despite this there is distal bilateral PCA enhancement. Anterior circulation: Both ICA siphons are patent but heavily calcified. On the left there is severe supraclinoid stenosis on series 8, image 63, mildly progressed since 2019. On the right there is moderate to severe cavernous and proximal supraclinoid stenosis due to bulky calcified plaque which appears stable. Carotid  termini remain patent. Mild-to-moderate bilateral chronic proximal MCA stenosis has not significantly changed. A1 segments remain normal. Mild bilateral ACA branch irregularity is stable. Distal right M1 stenosis is moderate and increased (series 10, image 16). Left MCA bifurcation remains patent, with stable left MCA branches demonstrating multifocal mild irregularity. Right MCA bifurcation remains patent with stable right MCA branches demonstrating multifocal mild irregularity. Venous sinuses: Patent. Anatomic variants: Dominant right vertebral artery. Review of the MIP images confirms the above findings IMPRESSION: 1. Negative for large vessel occlusion. 2. But positive for chronic severe atherosclerosis which has progressed since a 2019 CTA as follows: - New Severe stenosis or Short Segment Occlusion of the distal Left Vertebral Artery V4 segment. - New diminutive caliber of the Basilar Artery which remains patent. - RADIOGRAPHIC STRING SIGN stenosis of the proximal Right ICA has progressed. Chronic moderate to severe Left ICA siphon stenosis is stable. -  progressed Left ICA supraclinoid stenosis which is now moderate to severe. Stable proximal left Left 70% stenosis. - progressed and Severe bilateral PCA P1/P2 stenoses. - progressed and moderate Right MCA distal M1 stenosis. 3. Stable moderate bilateral proximal MCA stenoses. Stable proximal left subclavian artery plaque with 50% stenosis. Electronically Signed   By: Genevie Ann M.D.   On: 08/22/2019 11:59      Procedures: left carotid endartectomy.   Subjective: Patient is feeling better, continue to be very weak and deconditioned, no nausea or vomiting, no chest pain or dyspnea.   Discharge Exam: Vitals:   08/31/19 0510 08/31/19 0813  BP: (!) 155/76 (!) 142/75  Pulse: 73 69  Resp: 15 17  Temp: 99.5 F (37.5 C) 98.2 F (36.8 C)  SpO2: 94% 92%   Vitals:   08/30/19 1955 08/31/19 0013 08/31/19 0510 08/31/19 0813  BP: (!) 165/69 (!) 130/58 (!)  155/76 (!) 142/75  Pulse: 73 73 73 69  Resp: 17 13 15 17   Temp: 98.6 F (37 C) 98.5 F (36.9 C) 99.5 F (37.5 C) 98.2 F (36.8 C)  TempSrc: Oral Oral Oral Axillary  SpO2: 96% 98% 94% 92%  Weight:      Height:        General: Not in pain or dyspnea. Neurology: Awake and alert, non focal  E ENT: mild pallor, no icterus, oral mucosa moist Cardiovascular: No JVD. S1-S2 present, rhythmic, no gallops, rubs, or murmurs. No lower extremity edema. Pulmonary: positive breath sounds bilaterally, adequate air movement, no wheezing, rhonchi or rales. Gastrointestinal. Abdomen with no organomegaly, non tender, no rebound or guarding Skin. No rashes Musculoskeletal: no joint deformities   The results of significant diagnostics from this hospitalization (including imaging, microbiology, ancillary and laboratory) are listed below for reference.     Microbiology: Recent Results (from the past 240 hour(s))  Urine culture     Status: Abnormal   Collection Time: 08/21/19  8:44 PM   Specimen: Urine, Clean Catch  Result Value Ref Range Status   Specimen Description URINE, CLEAN CATCH  Final   Special Requests   Final    NONE Performed at [facility], [place] [address] [place], [place] [address]    Culture (A)  Final    30,000 COLONIES/mL MULTIPLE SPECIES PRESENT, SUGGEST RECOLLECTION   Report Status 08/23/2019 FINAL  Final  SARS Coronavirus 2 by RT PCR (hospital order, performed in [facility] hospital lab) Nasopharyngeal Nasopharyngeal Swab     Status: None   Collection Time: 08/22/19  1:04 AM   Specimen: Nasopharyngeal Swab  Result Value Ref Range Status   SARS Coronavirus 2 NEGATIVE NEGATIVE Final    Comment: (NOTE) SARS-CoV-2 target nucleic acids are NOT DETECTED.  The SARS-CoV-2 RNA is generally detectable in upper and lower respiratory specimens during the acute phase of infection. The lowest concentration of SARS-CoV-2 viral copies this assay can detect is 250 copies / mL. A  negative result does not preclude SARS-CoV-2 infection and should not be used as the sole basis for treatment or other patient management decisions.  A negative result may occur with improper specimen collection / handling, submission of specimen other than nasopharyngeal swab, presence of viral mutation(s) within the areas targeted by this assay, and inadequate number of viral copies (<250 copies / mL). A negative result must be combined with clinical observations, patient history, and epidemiological information.  Fact Sheet for Patients:   StrictlyIdeas.no  Fact Sheet for Healthcare Providers: BankingDealers.co.za  This test is  not yet approved or  cleared by the [place] and has been authorized for detection and/or diagnosis of SARS-CoV-2 by FDA under an Emergency Use Authorization (EUA).  This EUA will remain in effect (meaning this test can be used) for the duration of the COVID-19 declaration under Section 564(b)(1) of the Act, 21 U.S.C. section 360bbb-3(b)(1), unless the authorization is terminated or revoked sooner.  Performed at [facility], [place] [address], [place], [place] [address]   Surgical PCR screen     Status: None   Collection Time: 08/25/19  9:47 PM   Specimen: Nasal Mucosa; Nasal Swab  Result Value Ref Range Status   MRSA, PCR NEGATIVE NEGATIVE Final   Staphylococcus aureus NEGATIVE NEGATIVE Final    Comment: (NOTE) The Xpert SA Assay (FDA approved for NASAL specimens in patients 26 years of age and older), is one component of a comprehensive surveillance program. It is not intended to diagnose infection nor to guide or monitor treatment. Performed at [facility], [place] [address], [place], [place] [address]      Labs: BNP (last 3 results) No results for input(s): BNP in the last 8760 hours. Basic Metabolic Panel: Recent Labs  Lab 08/25/19 0424 08/27/19 0320 08/28/19 1037  08/29/19 0205 08/31/19 0352  NA 140 142 137 139 138  K 3.7 3.7 3.7 3.6 3.2*  CL 108 114* 108 111 106  CO2 24 20* 20* 21* 21*  GLUCOSE 259* 120* 319* 172* 173*  BUN <5* 10 10 9  5*  CREATININE 0.72 0.75 0.78 0.80 0.61  CALCIUM 8.9 8.0* 8.1* 8.3* 8.1*  MG 2.0 1.4*  --   --   --    Liver Function Tests: Recent Labs  Lab 08/25/19 0424 08/27/19 0320 08/29/19 0205  AST 20 17 21   ALT 18 13 18   ALKPHOS 63 46 60  BILITOT 0.6 0.6 0.7  PROT 6.3* 5.2* 5.4*  ALBUMIN 3.2* 2.6* 2.7*   No results for input(s): LIPASE, AMYLASE in the last 168 hours. No results for input(s): AMMONIA in the last 168 hours. CBC: Recent Labs  Lab 08/26/19 0602 08/27/19 0320 08/28/19 1037 08/29/19 0205 08/31/19 0352  WBC 5.7 8.3 6.6 7.0 6.4  HGB 11.8* 9.1* 9.3* 8.8* 9.1*  HCT 36.6 28.1* 29.5* 27.7* 28.3*  MCV 89.9 93.0 93.1 91.7 91.3  PLT 221 208 191 179 207   Cardiac Enzymes: No results for input(s): CKTOTAL, CKMB, CKMBINDEX, TROPONINI in the last 168 hours. BNP: Invalid input(s): POCBNP CBG: Recent Labs  Lab 08/29/19 2113 08/30/19 1245 08/30/19 1716 08/30/19 2143 08/31/19 0623  GLUCAP 237* 120* 244* 194* 98   D-Dimer No results for input(s): DDIMER in the last 72 hours. Hgb A1c No results for input(s): HGBA1C in the last 72 hours. Lipid Profile No results for input(s): CHOL, HDL, LDLCALC, TRIG, CHOLHDL, LDLDIRECT in the last 72 hours. Thyroid function studies No results for input(s): TSH, T4TOTAL, T3FREE, THYROIDAB in the last 72 hours.  Invalid input(s): FREET3 Anemia work up No results for input(s): VITAMINB12, FOLATE, FERRITIN, TIBC, IRON, RETICCTPCT in the last 72 hours. Urinalysis    Component Value Date/Time   COLORURINE YELLOW 08/21/2019 2040   APPEARANCEUR CLEAR 08/21/2019 2040   LABSPEC 1.027 08/21/2019 2040   PHURINE 5.0 08/21/2019 2040   GLUCOSEU >=500 (A) 08/21/2019 2040   GLUCOSEU >=1000 (A) 03/16/2019 0915   HGBUR NEGATIVE 08/21/2019 2040   BILIRUBINUR NEGATIVE  08/21/2019 2040   BILIRUBINUR negative 12/18/2017 1613   KETONESUR 20 (A)  08/21/2019 2040   PROTEINUR NEGATIVE 08/21/2019 2040   UROBILINOGEN 0.2 03/16/2019 0915   NITRITE NEGATIVE 08/21/2019 2040   LEUKOCYTESUR NEGATIVE 08/21/2019 2040   Sepsis Labs Invalid input(s): PROCALCITONIN,  WBC,  LACTICIDVEN Microbiology Recent Results (from the past 240 hour(s))  Urine culture     Status: Abnormal   Collection Time: 08/21/19  8:44 PM   Specimen: Urine, Clean Catch  Result Value Ref Range Status   Specimen Description URINE, CLEAN CATCH  Final   Special Requests   Final    NONE Performed at [facility], [place] [address], [place], [place] [address]    Culture (A)  Final    30,000 COLONIES/mL MULTIPLE SPECIES PRESENT, SUGGEST RECOLLECTION   Report Status 08/23/2019 FINAL  Final  SARS Coronavirus 2 by RT PCR (hospital order, performed in [facility] hospital lab) Nasopharyngeal Nasopharyngeal Swab     Status: None   Collection Time: 08/22/19  1:04 AM   Specimen: Nasopharyngeal Swab  Result Value Ref Range Status   SARS Coronavirus 2 NEGATIVE NEGATIVE Final    Comment: (NOTE) SARS-CoV-2 target nucleic acids are NOT DETECTED.  The SARS-CoV-2 RNA is generally detectable in upper and lower respiratory specimens during the acute phase of infection. The lowest concentration of SARS-CoV-2 viral copies this assay can detect is 250 copies / mL. A negative result does not preclude SARS-CoV-2 infection and should not be used as the sole basis for treatment or other patient management decisions.  A negative result may occur with improper specimen collection / handling, submission of specimen other than nasopharyngeal swab, presence of viral mutation(s) within the areas targeted by this assay, and inadequate number of viral copies (<250 copies / mL). A negative result must be combined with clinical observations, patient history, and epidemiological information.  Fact Sheet for Patients:    StrictlyIdeas.no  Fact Sheet for Healthcare Providers: BankingDealers.co.za  This test is not yet approved or  cleared by the [place] FDA and has been authorized for detection and/or diagnosis of SARS-CoV-2 by FDA under an Emergency Use Authorization (EUA).  This EUA will remain in effect (meaning this test can be used) for the duration of the COVID-19 declaration under Section 564(b)(1) of the Act, 21 U.S.C. section 360bbb-3(b)(1), unless the authorization is terminated or revoked sooner.  Performed at [facility], [place] [address] [place], [place] [address]   Surgical PCR screen     Status: None   Collection Time: 08/25/19  9:47 PM   Specimen: Nasal Mucosa; Nasal Swab  Result Value Ref Range Status   MRSA, PCR NEGATIVE NEGATIVE Final   Staphylococcus aureus NEGATIVE NEGATIVE Final    Comment: (NOTE) The Xpert SA Assay (FDA approved for NASAL specimens in patients 54 years of age and older), is one component of a comprehensive surveillance program. It is not intended to diagnose infection nor to guide or monitor treatment. Performed at [facility], [place] [address], [place], [place] [address]      Time coordinating discharge: 45 minutes  SIGNED:   Tawni Millers, MD  [facility] 08/31/2019, 11:13 AM

## 2019-08-31 NOTE — Progress Notes (Signed)
Inpatient Rehabilitation Medication Review by a Pharmacist  A complete drug regimen review was completed for this patient to identify any potential clinically significant medication issues.  Clinically significant medication issues were identified:  No  Time spent performing this drug regimen review (minutes):  10   Efraim Kaufmann, PharmD, BCPS 08/31/2019 7:59 PM

## 2019-08-31 NOTE — Progress Notes (Signed)
Inpatient Rehabilitation Admissions Coordinator  I have insurance approval and [facility] bed available today. I spoke with patient, son [name] at bedside and granddaughter , NIcki by phone. They are in agreement to admit. I contacted Dr. Cathlean Sauer for approval and will make the arrangements to admit today.  Danne Baxter, RN, MSN Rehab Admissions Coordinator [phone] 08/31/2019 11:27 AM

## 2019-08-31 NOTE — Progress Notes (Signed)
Patient was admitted to [phone], brought on the floor by her nurse in a wheelchair. No c/o pain or discomfort, alert & responsive, no acute distress noted. She was accompanied by her son [name] also.

## 2019-08-31 NOTE — H&P (Signed)
Physical Medicine and Rehabilitation Admission H&P    Chief Complaint  Patient presents with   Urinary Frequency   Hyperglycemia  : HPI: Jaclyn [name] is an 84 year old right-handed female with history of atrial fibrillation maintained on Eliquis, bilateral ICA stenosis, recurrent UTIs, fibromyalgia, diabetes mellitus, hyperlipidemia, multiple TIAs without residual weakness. History taken from chart review and sign due to [facility] and cognition. Patient lives with son.  1 level home.  Son works during the day.  Independent with assistive device.  Cares for herself needing only limited assistance for dressing at times.  She does not drive. She presented on 08/21/19 with left hemiparesis and AMS. She was found down by her son.  Cranial CT scan unremarkable for acute intracranial process.  Patient did not receive TPA.  MRI of the brain showed a 6 mm acute ischemic nonhemorrhagic periventricular white matter infarction involving the posterior right frontal corona radiata centrum semiovale.  CT angiogram of head and neck negative for large vessel occlusion.  Echocardiogram with EF of 70%. No wall motion abnormalities.  EEG x2 negative.  Admission chemistry sodium 132, ammonia level 26, glucose 360, hemoglobin 11.4 urinalysis negative nitrite hemoglobin A1c of 14.2.  Hospital course further complicated by right carotid stenosis and patient underwent right carotid enterectomy with Dacron patch angioplasty on 08/26/19 per Dr. Donnetta Hutching complicated by postoperative hypotension requiring dopamine for 24 hours.  Neurology follow-up and patient was cleared to resume her Eliquis as prior to admission as well as the addition of low-dose aspirin.  Tolerating a regular diet.  Therapy evaluations completed with recommendations of physical medicine rehab consult.  Please see preadmission assessment from earlier today as well.   Review of Systems  Constitutional: Negative for chills, fever and malaise/fatigue.  HENT:  Positive for hearing loss.   Eyes: Negative for blurred vision and double vision.  Respiratory: Negative for cough and shortness of breath.   Gastrointestinal: Positive for constipation. Negative for heartburn, nausea and vomiting.  Genitourinary: Positive for urgency. Negative for dysuria, flank pain and hematuria.  Musculoskeletal: Positive for back pain, joint pain and myalgias.  Skin: Negative for rash.  Neurological: Positive for headaches. Negative for weakness.  Psychiatric/Behavioral: The patient has insomnia.   All other systems reviewed and are negative.  Past Medical History:  Diagnosis Date   Atrial fibrillation ([place])    Benign neoplasm of colon    Carotid artery occlusion    60-79% right ICA stenosis   Carotid bruit    Cerebrovascular disease, unspecified    Difficult intubation 2008   During surgery to remove large polyp   Diverticulosis of colon (without mention of hemorrhage)    Dysthymic disorder    Fibromyalgia    Headache(784.0)    Insomnia, unspecified    Interstitial cystitis    Myalgia and myositis, unspecified    Osteoarthrosis, unspecified whether generalized or localized, unspecified site    Other and unspecified hyperlipidemia    Other chest pain    Other specified benign mammary dysplasias    TIA (transient ischemic attack)    Type II or unspecified type diabetes mellitus without mention of complication, not stated as uncontrolled    Unspecified essential hypertension    Unspecified hypothyroidism    Unspecified vitamin D deficiency    Urinary tract infection, site not specified    Past Surgical History:  Procedure Laterality Date   ABDOMINAL HYSTERECTOMY  1988   cervical dysplasia   CARDIAC CATHETERIZATION  02/19/06   EF 60%   COLONOSCOPY  ENDARTERECTOMY Right 08/26/2019   Procedure: RIGHT CAROTID ENDARTERECTOMY;  Surgeon: Rosetta Posner, MD;  [facility] OR;  Service: Vascular;  Laterality: Right;   PATCH  ANGIOPLASTY Right 08/26/2019   Procedure: PATCH ANGIOPLASTY OF RIGHT COMMON CAROTID ARTERY USING HEMASHIELD PLATINUM FINESSE PATCH;  Surgeon: Rosetta Posner, MD;  Location: [facility] OR;  Service: Vascular;  Laterality: Right;   RIGHT COLECTOMY  2005   for villous adenoma of the cecum Dr.streck   VESICOVAGINAL FISTULA CLOSURE W/ [place]   w/ cystocele &retocele repairs Dr. Ree Edman   Family History  Problem Relation Age of Onset   Lymphoma Father    Hypertension Father    Stroke Father    Hyperlipidemia Father    Hypertension Mother    Allergies Brother    Hypertension Sister        3   Breast cancer Sister    Fibromyalgia Sister        3   Hyperlipidemia Sister        3   Social History:  reports that she has never smoked. She has never used smokeless tobacco. She reports that she does not drink alcohol and does not use drugs. Allergies:  Allergies  Allergen Reactions   Naproxen Sodium Other (See Comments)    Fever/aches and pains   Statins Other (See Comments)    myalgias   Sulfonamide Derivatives Hives and Itching   Latex Itching and Rash   Shellfish Allergy Itching, Swelling and Rash    Seafood, shrimp   Medications Prior to Admission  Medication Sig Dispense Refill   apixaban (ELIQUIS) 5 MG TABS tablet Take 1 tablet (5 mg total) by mouth 2 (two) times daily. 180 tablet 1   Insulin Glargine (LANTUS SOLOSTAR) 100 UNIT/ML Solostar Pen Inject 65 Units into the skin daily. 15 mL 11   levothyroxine (SYNTHROID) 112 MCG tablet Take 1 tablet (112 mcg total) by mouth daily. 90 tablet 3   sitaGLIPtin (JANUVIA) 100 MG tablet Take 1 tablet (100 mg total) by mouth daily. 30 tablet 11   trimethoprim (TRIMPEX) 100 MG tablet Take 1 tablet (100 mg total) by mouth daily. 90 tablet 3   B-D ULTRAFINE III SHORT PEN 31G X 8 MM MISC USE TO INJECT INSULIN ONCE DAILY. DX: E11.65 100 each 3   DULoxetine (CYMBALTA) 30 MG capsule Take 2 capsules (60 mg total) by mouth daily.  (Patient not taking: Reported on 08/21/2019) 180 capsule 1   vitamin B-12 (CYANOCOBALAMIN) 1000 MCG tablet Take 1 tablet (1,000 mcg total) by mouth daily. 30 tablet 11   Vitamin D, Ergocalciferol, (DRISDOL) 1.25 MG (50000 UT) CAPS capsule Take 1 capsule (50,000 Units total) by mouth every 7 (seven) days. 12 capsule 0    Drug Regimen Review Drug regimen was reviewed and remains appropriate with no significant issues identified.  Home: Home Living Family/patient expects to be discharged to:: Private residence Living Arrangements:  (son and daughter) Available Help at Discharge: Family, Available 24 hours/day (son, daughter and granddaughter) Type of Home: House Home Access: Level entry Home Layout: One level Bathroom Shower/Tub: Tub/shower unit, Architectural technologist: Standard Bathroom Accessibility: Yes Home Equipment: Cane - single point, Grab bars - tub/shower, Bedside commode Additional Comments: worked for [employer] long for a time  Lives With: Family (son and daughter)   Functional History: Prior Function Level of Independence: Independent with assistive device(s) ADL's / Homemaking Assistance Needed: son reports that he stands in the bathroom while she showers to make sure  she is okay but she does all bathing without his physical (A)  Comments: reports continous UTIs and increased balance deficits during the UTIs  Functional Status:  Mobility: Bed Mobility Overal bed mobility: Needs Assistance Bed Mobility: Supine to Sit Rolling: Min assist Supine to sit: Min assist, HOB elevated Sit to supine: Min assist, HOB elevated Sit to sidelying: Min assist General bed mobility comments: min assist for supine<>sit for trunk and LE management, pt able to scoot to and from EOB with increased time. Transfers Overall transfer level: Needs assistance Equipment used: 1 person hand held assist, Rolling walker (2 wheeled) Transfers: Sit to/from Stand Sit to Stand: Min assist Stand pivot  transfers: Min assist General transfer comment: STS x3, first attempt from EOB with HHA and min assist to steady. Pt abruptly sat, reaching for lipstick on bed, so stood at EOB x2. 3rd stand from chair with use of RW, still min assist to steady and pt with improper hand placement when rising. Ambulation/Gait Ambulation/Gait assistance: Min assist, Mod assist Gait Distance (Feet): 160 Feet Assistive device: Rolling walker (2 wheeled) Gait Pattern/deviations: Step-through pattern, Decreased stride length, Drifts right/left, Narrow base of support, Staggering left General Gait Details: Min assist to steady, guide pt and RW. Pt bumping into objects on L multiple times, requires significant PT cuing to avoid bumping into the wall and objects on L. Additional verbal cuing for placement in RW, upright posture. Gait velocity: decr Gait velocity interpretation: <1.8 ft/sec, indicate of risk for recurrent falls    ADL: ADL Overall ADL's : Needs assistance/impaired Eating/Feeding: Set up, Sitting Eating/Feeding Details (indicate cue type and reason): pt with recent partial denture fallign out of mouth. son reports "we have them at home but she doesnt have a dentist any more" ( 1 week ago fell out eating. Pt eating all areas of tray this session. pt expressed strong love of pinto beans Grooming: Minimal assistance, Wash/dry hands, Wash/dry face, Oral care Grooming Details (indicate cue type and reason): Patient required repeat vc's for sequencing of oral hygiene standing at sink level. Increased difficulty with locating ADL items in central vision.  Lower Body Dressing: Minimal assistance Lower Body Dressing Details (indicate cue type and reason): max verbal cues to adjust socks, but able to complete when hand initially guided to socks when sitting in chair (feet were elevated in recliner at time) Toilet Transfer: Minimal assistance, Grab bars, Regular Toilet, Cueing for safety, Cueing for  sequencing Toilet Transfer Details (indicate cue type and reason): heavy reliance on grab bar with R UE. pt needs (A) To power up into standing, needed increased A sequencing movements to position over commode appropriately Toileting- Clothing Manipulation and Hygiene: Moderate assistance, Cueing for compensatory techniques, Cueing for safety, Cueing for sequencing, Maximal assistance Toileting - Clothing Manipulation Details (indicate cue type and reason): pt able to void in session, however unable to complete clothing management and peri-care with mod-max A to sequence. once instructed to complete in standing, pt able to complete, however required mod A from therapist to ensure balance Functional mobility during ADLs: Minimal assistance, Moderate assistance General ADL Comments: Functional mobility with use of RW and Min A for walker management, sequencing, and safety.   Cognition: Cognition Overall Cognitive Status: Impaired/Different from baseline Arousal/Alertness: Awake/alert Orientation Level: Oriented X4 Attention: Focused, Sustained Focused Attention: Impaired Focused Attention Impairment: Verbal basic, Functional basic Sustained Attention: Impaired Sustained Attention Impairment: Verbal basic, Functional basic Memory: Impaired Memory Impairment: Storage deficit, Retrieval deficit, Decreased recall of new information, Decreased  short term memory Decreased Short Term Memory: Verbal basic, Functional basic Awareness: Impaired Awareness Impairment: Intellectual impairment Problem Solving: Impaired Problem Solving Impairment: Verbal basic, Functional basic Executive Function: Reasoning, Decision Making Reasoning: Impaired Reasoning Impairment: Verbal basic, Functional basic Decision Making: Impaired Decision Making Impairment: Verbal basic, Functional basic Behaviors: Restless, Impulsive, Verbal agitation, Perseveration Safety/Judgment: Impaired Cognition Arousal/Alertness:  Awake/alert Behavior During Therapy: Impulsive, WFL for tasks assessed/performed Overall Cognitive Status: Impaired/Different from baseline Area of Impairment: Attention, Awareness, Problem solving, Safety/judgement, Following commands, Memory Orientation Level: Time, Disoriented to Current Attention Level: Sustained Memory: Decreased short-term memory Following Commands: Follows one step commands inconsistently, Follows one step commands with increased time Safety/Judgement: Decreased awareness of deficits, Decreased awareness of safety Awareness: Emergent Problem Solving: Slow processing, Difficulty sequencing, Requires verbal cues, Requires tactile cues General Comments: Pt attempts to mobilize OOB at EOB x2 without PT assist, pt instructed to wait. L inattention noted in pt bumping and steering towards L side of hallway multiple times, lacks insight into this.  Physical Exam: Blood pressure 137/70, pulse 78, temperature 98.2 F (36.8 C), temperature source Axillary, resp. rate 17, height 5\' 2"  (1.575 m), weight 66 kg, SpO2 98 %. Physical Exam Vitals reviewed.  Constitutional:      General: She is not in acute distress. HENT:     Head: Normocephalic and atraumatic.     Comments: Poor dentition    Right Ear: External ear normal.     Left Ear: External ear normal.     Nose: Nose normal.  Eyes:     General:        Right eye: No discharge.        Left eye: No discharge.     Extraocular Movements: Extraocular movements intact.  Pulmonary:     Effort: Pulmonary effort is normal. No respiratory distress.     Breath sounds: No stridor.  Abdominal:     General: Abdomen is flat. There is no distension.     Palpations: Abdomen is soft.  Musculoskeletal:     Cervical back: Normal range of motion and neck supple.     Comments: No edema or tenderness in extremities  Skin:    General: Skin is warm and dry.  Neurological:     Mental Status: She is alert.     Comments: Patient is alert  in no acute distress.   Severe HOH Follows simple commands.   Provides her name and age. Motor: Grossly 5/5 throughout  Psychiatric:        Speech: Speech is tangential.     Comments: Confused     Results for orders placed or performed during the hospital encounter of 08/21/19 (from the past 48 hour(s))  Glucose, capillary     Status: Abnormal   Collection Time: 08/29/19  5:11 PM  Result Value Ref Range   Glucose-Capillary 184 (H) 70 - 99 mg/dL    Comment: Glucose reference range applies only to samples taken after fasting for at least 8 hours.  Glucose, capillary     Status: Abnormal   Collection Time: 08/29/19  9:13 PM  Result Value Ref Range   Glucose-Capillary 237 (H) 70 - 99 mg/dL    Comment: Glucose reference range applies only to samples taken after fasting for at least 8 hours.  Glucose, capillary     Status: Abnormal   Collection Time: 08/30/19 12:45 PM  Result Value Ref Range   Glucose-Capillary 120 (H) 70 - 99 mg/dL    Comment: Glucose reference range applies  only to samples taken after fasting for at least 8 hours.  Glucose, capillary     Status: Abnormal   Collection Time: 08/30/19  5:16 PM  Result Value Ref Range   Glucose-Capillary 244 (H) 70 - 99 mg/dL    Comment: Glucose reference range applies only to samples taken after fasting for at least 8 hours.  Glucose, capillary     Status: Abnormal   Collection Time: 08/30/19  9:43 PM  Result Value Ref Range   Glucose-Capillary 194 (H) 70 - 99 mg/dL    Comment: Glucose reference range applies only to samples taken after fasting for at least 8 hours.  CBC     Status: Abnormal   Collection Time: 08/31/19  3:52 AM  Result Value Ref Range   WBC 6.4 4.0 - 10.5 K/uL   RBC 3.10 (L) 3.87 - 5.11 MIL/uL   Hemoglobin 9.1 (L) 12.0 - 15.0 g/dL   HCT 28.3 (L) 36 - 46 %   MCV 91.3 80.0 - 100.0 fL   MCH 29.4 26.0 - 34.0 pg   MCHC 32.2 30.0 - 36.0 g/dL   RDW 15.0 11.5 - 15.5 %   Platelets 207 150 - 400 K/uL   nRBC 0.0 0.0 -  0.2 %    Comment: Performed at [facility], [place] [address], [place], [place] [address]  Basic metabolic panel     Status: Abnormal   Collection Time: 08/31/19  3:52 AM  Result Value Ref Range   Sodium 138 135 - 145 mmol/L   Potassium 3.2 (L) 3.5 - 5.1 mmol/L   Chloride 106 98 - 111 mmol/L   CO2 21 (L) 22 - 32 mmol/L   Glucose, Bld 173 (H) 70 - 99 mg/dL    Comment: Glucose reference range applies only to samples taken after fasting for at least 8 hours.   BUN 5 (L) 8 - 23 mg/dL   Creatinine, Ser 0.61 0.44 - 1.00 mg/dL   Calcium 8.1 (L) 8.9 - 10.3 mg/dL   GFR calc non Af Amer >60 >60 mL/min   GFR calc Af Amer >60 >60 mL/min   Anion gap 11 5 - 15    Comment: Performed at [facility] [address] [place], [place] [address]  Glucose, capillary     Status: None   Collection Time: 08/31/19  6:23 AM  Result Value Ref Range   Glucose-Capillary 98 70 - 99 mg/dL    Comment: Glucose reference range applies only to samples taken after fasting for at least 8 hours.  Glucose, capillary     Status: Abnormal   Collection Time: 08/31/19 11:44 AM  Result Value Ref Range   Glucose-Capillary 155 (H) 70 - 99 mg/dL    Comment: Glucose reference range applies only to samples taken after fasting for at least 8 hours.   Comment 1 Notify RN    EEG adult  Result Date: 08/30/2019 [name], MD     08/30/2019  4:31 PM Patient Name: Jaclyn [name] MRN: [id number] Epilepsy Attending: [name] Referring Physician/Provider: Dr Rosalin Hawking Date: 08/30/2019 Duration: 24.27 mins Patient history: 84yo F with acute right hemispheric strokes and episodes of transient aphasia. EEG to evaluate for seizure Level of alertness: Awake, asleep AEDs during EEG study: None Technical aspects: This EEG study was done with scalp electrodes positioned according to the 10-20 International system of electrode placement. Electrical activity was acquired at a sampling rate of 500Hz  and reviewed with a high  frequency filter of 70Hz  and a low frequency filter of 1Hz . EEG data were recorded continuously and digitally stored. Description: The posterior dominant rhythm consists of 7.5-8 Hz activity of moderate voltage (25-35 uV) seen predominantly in posterior head regions, symmetric and reactive to eye opening and eye closing. Sleep was characterized by vertex waves, sleep spindles (12 to 14 Hz), maximal frontocentral region. Hyperventilation and photic stimulation were not performed.   IMPRESSION: This study is within normal limits. No seizures or epileptiform discharges were seen throughout the recording. [name]       Medical Problem List and Plan: 1.  Left side weakness secondary to right MCA small subcortical infarct in the setting of severe intracranial large vessel disease with high-grade right ICA stenosis.  Status post right CEA  -patient may shower if incision covered  -ELOS/Goals: 8-12 days/Supervision  Admit to CIR 2.  Antithrombotics: -DVT/anticoagulation: Eliquis  -antiplatelet therapy: Aspirin 81 mg daily 3. Pain Management: Oxycodone as needed 4. Mood: Cymbalta 60 mg daily  -antipsychotic agents: N/A 5. Neuropsych: This patient is ?fully capable of making decisions on her own behalf. 6. Skin/Wound Care: Routine skin checks 7. Fluids/Electrolytes/Nutrition: Routine in and outs. CMP ordered 8.  Orthostasis after CEA.  ProAmatine 2.5 mg 3 times daily.  Monitor with increased activity 9.  Diabetes mellitus.  Hemoglobin A1c 14.2.  NovoLog 6 units 3 times daily, Levemir 40 units twice daily, Tradjenta 5 mg daily.  Provide diabetic teaching  Monitor with increased mobility 10.  Hypothyroidism. Continue Synthroid  11.  Hyperlipidemia.  Lipitor 12.  Recurrent UTIs. Tripmex 100 mg daily  Cathlyn Parsons, PA-C 08/31/2019  I have personally performed a face to face diagnostic evaluation, including, but not limited to relevant history and physical exam findings, of this patient  and developed relevant assessment and plan.  Additionally, I have reviewed and concur with the physician assistant's documentation above.  Delice Lesch, MD, ABPMR  The patient's status has not changed. The original post admission physician evaluation remains appropriate, and any changes from the pre-admission screening or documentation from the acute chart are noted above.   Delice Lesch, MD, ABPMR

## 2019-08-31 NOTE — TOC Transition Note (Signed)
Transition of Care [facility]) - CM/SW Discharge Note   Patient Details  Name: Jaclyn [name] MRN: [id number] Date of Birth: [birth year]  Transition of Care [facility]) CM/SW Contact:  Zenon Mayo, RN Phone Number: 08/31/2019, 11:57 AM   Clinical Narrative:    DC to CIR today.   Final next level of care: IP Rehab Facility Barriers to Discharge: No Barriers Identified   Patient Goals and CMS Choice        Discharge Placement                       Discharge Plan and Services                                     Social Determinants of Health (SDOH) Interventions     Readmission Risk Interventions No flowsheet data found.

## 2019-08-31 NOTE — Progress Notes (Signed)
Jamse Arn, MD  Physician  Physical Medicine and Rehabilitation  PMR Pre-admission     Addendum  Date of Service:  08/30/2019 12:07 PM      Related encounter: ED to Hosp-Admission (Current) from 08/21/2019 in [facility] 4E CV SURGICAL PROGRESSIVE CARE       Show:Clear all [x] Manual[x] Template[x] Copied  Added by: [x] Cristina Gong, RN[x] Jamse Arn, MD  [] Hover for details PMR Admission Coordinator Pre-Admission Assessment   Patient: Jaclyn [name] is an 84 y.o., female MRN: [id number] DOB: 1935/07/29 Height: 5\' 2"  (157.5 cm) Weight: 66 kg                                                                                                                                                  Insurance Information HMO: yes    PPO:      PCP:      IPA:      80/20:      OTHER:  PRIMARY: Blue Medicare      Policy#: [id number]      Subscriber: pt CM Name: Shanon      Phone#: [phone]     Fax#: [phone] Pre-Cert#: tbd for 7 days      Employer:  Benefits:  Phone #: [phone]     Name: 7/13 Eff. Date: 02/18/2019     Deduct: none      Out of Pocket Max: $3900      Life Max: none  CIR: $335 per day days 1 until 6      SNF: no copay days 1 until 20; $184 per day days 21 until 60; no co pays days 61 until 100 Outpatient: $40 per visit     Co-Pay: visits per medical neccesity Home Health: 100%      Co-Pay: visits per medical neccesity  DME: 80%     Co-Pay: 20% Providers: none  SECONDARY:       Policy#:       Phone#:    Development worker, community:       Phone#:    The Engineer, petroleum" for patients in Inpatient Rehabilitation Facilities with attached "Privacy Act [place] Records" was provided and verbally reviewed with: Patient and Family   Emergency Contact Information Contact Information       Name Relation Home Work [place] Son [phone]   [phone]    [name]     [phone]         Current  Medical History  Patient Admitting Diagnosis: CVA   History of Present Illness:Jaclyn [name] is an 84 year old right-handed female with history of atrial fibrillation maintained on Eliquis, bilateral ICA stenosis, recurrent UTIs, fibromyalgia, diabetes mellitus, hyperlipidemia, multiple TIA's without residual weakness.  Per chart review lives with son.  1 level home.  Son works during the day.  Independent with assistive device.  Care  for herself needing only limited assistance for dressing at times.  She does not drive.  Presented 08/21/2019 with left-sided weakness and altered mental status.  She was found down by her son.  Cranial CT scan negative.  Patient did not receive TPA.  MRI of the brain showed a 6 mm acute ischemic nonhemorrhagic periventricular white matter infarction involving the posterior right frontal corona radiata centrum semi ovale.  CT angiogram of head and neck negative for large vessel occlusion.  Echocardiogram with ejection fraction of 70% no wall motion abnormalities.  EEG negative.  Admission chemistry sodium 132, ammonia level 26, glucose 360, hemoglobin 11.4 urinalysis negative nitrite hemoglobin A1c of 14.2.  Hospital course follow-up vascular surgery for right carotid stenosis patient underwent right carotid enterectomy with Dacron patch angioplasty 08/26/2019 per Dr. Donnetta Hutching complicated by postoperative hypotension requiring dopamine for 24 hours.  Neurology follow-up and patient was cleared to resume her Eliquis as prior to admission as well as the addition of low-dose aspirin. Midodrine added to assist with BP management. Tolerating a regular diet.    Complete NIHSS TOTAL: 4 Glasgow Coma Scale Score: 15   Past Medical History      Past Medical History:  Diagnosis Date  . Atrial fibrillation ([place])    . Benign neoplasm of colon    . Carotid artery occlusion      60-79% right ICA stenosis  . Carotid bruit    . Cerebrovascular disease, unspecified    . Difficult  intubation 2008    During surgery to remove large polyp  . Diverticulosis of colon (without mention of hemorrhage)    . Dysthymic disorder    . Fibromyalgia    . Headache(784.0)    . Insomnia, unspecified    . Interstitial cystitis    . Myalgia and myositis, unspecified    . Osteoarthrosis, unspecified whether generalized or localized, unspecified site    . Other and unspecified hyperlipidemia    . Other chest pain    . Other specified benign mammary dysplasias    . TIA (transient ischemic attack)    . Type II or unspecified type diabetes mellitus without mention of complication, not stated as uncontrolled    . Unspecified essential hypertension    . Unspecified hypothyroidism    . Unspecified vitamin D deficiency    . Urinary tract infection, site not specified        Family History  family history includes Allergies in her brother; Breast cancer in her sister; Fibromyalgia in her sister; Hyperlipidemia in her father and sister; Hypertension in her father, mother, and sister; Lymphoma in her father; Stroke in her father.   Prior Rehab/Hospitalizations:  Has the patient had prior rehab or hospitalizations prior to admission? Yes   Has the patient had major surgery during 100 days prior to admission? Yes   Current Medications    Current Facility-Administered Medications:  .  acetaminophen (TYLENOL) tablet 650 mg, 650 mg, Oral, Q4H PRN **OR** acetaminophen (TYLENOL) 160 MG/5ML solution 650 mg, 650 mg, Per Tube, Q4H PRN **OR** acetaminophen (TYLENOL) suppository 650 mg, 650 mg, Rectal, Q4H PRN, Baglia, Corrina, PA-C .  alum & mag hydroxide-simeth (MAALOX/MYLANTA) 200-200-20 MG/5ML suspension 15-30 mL, 15-30 mL, Oral, Q2H PRN, Baglia, Corrina, PA-C .  apixaban (ELIQUIS) tablet 5 mg, 5 mg, Oral, BID, Danford, Suann Larry, MD, 5 mg at 08/31/19 1015 .  aspirin EC tablet 81 mg, 81 mg, Oral, Daily, Baglia, Corrina, PA-C, 81 mg at 08/31/19 1010 .  atorvastatin (LIPITOR) tablet 40  mg, 40  mg, Oral, Daily, Baglia, Corrina, PA-C, 40 mg at 08/31/19 1011 .  docusate sodium (COLACE) capsule 100 mg, 100 mg, Oral, Daily, Baglia, Corrina, PA-C, 100 mg at 08/31/19 1010 .  DULoxetine (CYMBALTA) DR capsule 60 mg, 60 mg, Oral, Daily, Danford, Suann Larry, MD, 60 mg at 08/31/19 1011 .  guaiFENesin-dextromethorphan (ROBITUSSIN DM) 100-10 MG/5ML syrup 15 mL, 15 mL, Oral, Q4H PRN, Baglia, Corrina, PA-C .  hydrALAZINE (APRESOLINE) injection 5 mg, 5 mg, Intravenous, Q20 Min PRN, Baglia, Corrina, PA-C .  insulin aspart (novoLOG) injection 0-15 Units, 0-15 Units, Subcutaneous, TID WC, Baglia, Corrina, PA-C, 5 Units at 08/30/19 1722 .  insulin aspart (novoLOG) injection 0-5 Units, 0-5 Units, Subcutaneous, QHS, Baglia, Corrina, PA-C, 2 Units at 08/29/19 2214 .  insulin aspart (novoLOG) injection 6 Units, 6 Units, Subcutaneous, TID WC, Danford, Suann Larry, MD, 6 Units at 08/30/19 1154 .  insulin detemir (LEVEMIR) injection 40 Units, 40 Units, Subcutaneous, BID, Danford, Suann Larry, MD, 40 Units at 08/31/19 1011 .  labetalol (NORMODYNE) injection 10 mg, 10 mg, Intravenous, Q10 min PRN, Baglia, Corrina, PA-C .  levothyroxine (SYNTHROID) tablet 112 mcg, 112 mcg, Oral, Daily, Baglia, Corrina, PA-C, 112 mcg at 08/31/19 0652 .  linagliptin (TRADJENTA) tablet 5 mg, 5 mg, Oral, Daily, Baglia, Corrina, PA-C, 5 mg at 08/31/19 1010 .  magnesium sulfate IVPB 2 g 50 mL, 2 g, Intravenous, Daily PRN, Baglia, Corrina, PA-C .  metoprolol tartrate (LOPRESSOR) injection 2-5 mg, 2-5 mg, Intravenous, Q2H PRN, Baglia, Corrina, PA-C .  midodrine (PROAMATINE) tablet 10 mg, 10 mg, Oral, TID WC, Danford, Christopher P, MD .  morphine 2 MG/ML injection 2-5 mg, 2-5 mg, Intravenous, Q1H PRN, Baglia, Corrina, PA-C .  ondansetron (ZOFRAN) injection 4 mg, 4 mg, Intravenous, Q6H PRN, Baglia, Corrina, PA-C .  oxyCODONE (Oxy IR/ROXICODONE) immediate release tablet 5-10 mg, 5-10 mg, Oral, Q4H PRN, Baglia, Corrina, PA-C .   pantoprazole (PROTONIX) EC tablet 40 mg, 40 mg, Oral, Daily, Baglia, Corrina, PA-C, 40 mg at 08/31/19 1010 .  phenol (CHLORASEPTIC) mouth spray 1 spray, 1 spray, Mouth/Throat, PRN, Baglia, Corrina, PA-C .  potassium chloride SA (KLOR-CON) CR tablet 20-40 mEq, 20-40 mEq, Oral, Daily PRN, Baglia, Corrina, PA-C .  senna-docusate (Senokot-S) tablet 2 tablet, 2 tablet, Oral, QHS PRN, Baglia, Corrina, PA-C .  trimethoprim (TRIMPEX) tablet 100 mg, 100 mg, Oral, Daily, Baglia, Corrina, PA-C, 100 mg at 08/31/19 1017 .  vitamin B-12 (CYANOCOBALAMIN) tablet 1,000 mcg, 1,000 mcg, Oral, Daily, Baglia, Corrina, PA-C, 1,000 mcg at 08/31/19 1010   Patients Current Diet:  Diet Order                  Diet Heart Room service appropriate? Yes with Assist; Fluid consistency: Thin  Diet effective now                         Precautions / Restrictions Precautions Precautions: Fall Precaution Comments: left inattention (improving) Restrictions Weight Bearing Restrictions: No    Has the patient had 2 or more falls or a fall with injury in the past year?No   Prior Activity Level Limited Community (1-2x/wk): Mod I at home ; sleeps during the day and up at night   Prior Functional Level Prior Function Level of Independence: Independent with assistive device(s) ADL's / Homemaking Assistance Needed: son reports that he stands in the bathroom while she showers to make sure she is okay but she does all bathing without his physical (A)  Comments: reports continous UTIs and increased balance deficits during the UTIs   Self Care: Did the patient need help bathing, dressing, using the toilet or eating?  Needed some help   Indoor Mobility: Did the patient need assistance with walking from room to room (with or without device)? Independent   Stairs: Did the patient need assistance with internal or external stairs (with or without device)? Independent   Functional Cognition: Did the patient need help planning  regular tasks such as shopping or remembering to take medications? Needed some help   Home Assistive Devices / Equipment Home Assistive Devices/Equipment: CBG Meter, Cane (specify quad or straight) Home Equipment: Cane - single point, Grab bars - tub/shower, Bedside commode   Prior Device Use: Indicate devices/aids used by the patient prior to current illness, exacerbation or injury? Walker   Current Functional Level Cognition   Arousal/Alertness: Awake/alert Overall Cognitive Status: Impaired/Different from baseline Current Attention Level: Sustained Orientation Level: Oriented X4 Following Commands: Follows one step commands inconsistently, Follows one step commands with increased time Safety/Judgement: Decreased awareness of deficits, Decreased awareness of safety General Comments: Pt attempts to mobilize OOB at EOB x2 without PT assist, pt instructed to wait. L inattention noted in pt bumping and steering towards L side of hallway multiple times, lacks insight into this. Attention: Focused, Sustained Focused Attention: Impaired Focused Attention Impairment: Verbal basic, Functional basic Sustained Attention: Impaired Sustained Attention Impairment: Verbal basic, Functional basic Memory: Impaired Memory Impairment: Storage deficit, Retrieval deficit, Decreased recall of new information, Decreased short term memory Decreased Short Term Memory: Verbal basic, Functional basic Awareness: Impaired Awareness Impairment: Intellectual impairment Problem Solving: Impaired Problem Solving Impairment: Verbal basic, Functional basic Executive Function: Reasoning, Decision Making Reasoning: Impaired Reasoning Impairment: Verbal basic, Functional basic Decision Making: Impaired Decision Making Impairment: Verbal basic, Functional basic Behaviors: Restless, Impulsive, Verbal agitation, Perseveration Safety/Judgment: Impaired    Extremity Assessment (includes Sensation/Coordination)   Upper  Extremity Assessment: Generalized weakness LUE Deficits / Details: AROM appears to be [facility] LUE Sensation: decreased proprioception LUE Coordination: decreased fine motor, decreased gross motor  Lower Extremity Assessment: Defer to PT evaluation LLE Deficits / Details: Able to perform LAQ and marching with some decreased range. Difficulty lifting LLE against gravity from bed. LLE Sensation: decreased light touch, decreased proprioception     ADLs   Overall ADL's : Needs assistance/impaired Eating/Feeding: Set up, Sitting Eating/Feeding Details (indicate cue type and reason): pt with recent partial denture fallign out of mouth. son reports "we have them at home but she doesnt have a dentist any more" ( 1 week ago fell out eating. Pt eating all areas of tray this session. pt expressed strong love of pinto beans Grooming: Minimal assistance, Wash/dry hands, Wash/dry face, Oral care Grooming Details (indicate cue type and reason): Patient required repeat vc's for sequencing of oral hygiene standing at sink level. Increased difficulty with locating ADL items in central vision.  Lower Body Dressing: Minimal assistance Lower Body Dressing Details (indicate cue type and reason): max verbal cues to adjust socks, but able to complete when hand initially guided to socks when sitting in chair (feet were elevated in recliner at time) Toilet Transfer: Minimal assistance, Grab bars, Regular Toilet, Cueing for safety, Cueing for sequencing Toilet Transfer Details (indicate cue type and reason): heavy reliance on grab bar with R UE. pt needs (A) To power up into standing, needed increased A sequencing movements to position over commode appropriately Toileting- Clothing Manipulation and Hygiene: Moderate assistance, Cueing for compensatory techniques, Cueing  for safety, Cueing for sequencing, Maximal assistance Toileting - Clothing Manipulation Details (indicate cue type and reason): pt able to void in session,  however unable to complete clothing management and peri-care with mod-max A to sequence. once instructed to complete in standing, pt able to complete, however required mod A from therapist to ensure balance Functional mobility during ADLs: Minimal assistance, Moderate assistance General ADL Comments: Functional mobility with use of RW and Min A for walker management, sequencing, and safety.      Mobility   Overal bed mobility: Needs Assistance Bed Mobility: Supine to Sit Rolling: Min assist Supine to sit: Min assist, HOB elevated Sit to supine: Min assist, HOB elevated Sit to sidelying: Min assist General bed mobility comments: min assist for supine<>sit for trunk and LE management, pt able to scoot to and from EOB with increased time.     Transfers   Overall transfer level: Needs assistance Equipment used: 1 person hand held assist, Rolling walker (2 wheeled) Transfers: Sit to/from Stand Sit to Stand: Min assist Stand pivot transfers: Min assist General transfer comment: STS x3, first attempt from EOB with HHA and min assist to steady. Pt abruptly sat, reaching for lipstick on bed, so stood at EOB x2. 3rd stand from chair with use of RW, still min assist to steady and pt with improper hand placement when rising.     Ambulation / Gait / Stairs / Wheelchair Mobility   Ambulation/Gait Ambulation/Gait assistance: Min assist, Mod assist Gait Distance (Feet): 160 Feet Assistive device: Rolling walker (2 wheeled) Gait Pattern/deviations: Step-through pattern, Decreased stride length, Drifts right/left, Narrow base of support, Staggering left General Gait Details: Min assist to steady, guide pt and RW. Pt bumping into objects on L multiple times, requires significant PT cuing to avoid bumping into the wall and objects on L. Additional verbal cuing for placement in RW, upright posture. Gait velocity: decr Gait velocity interpretation: <1.8 ft/sec, indicate of risk for recurrent falls       Posture / Balance Dynamic Sitting Balance Sitting balance - Comments: Varying assist needed, Min guard-Mod A due to poor balance reactions and LOB in all directions. Balance Overall balance assessment: Needs assistance, History of Falls Sitting-balance support: No upper extremity supported, Feet supported Sitting balance-Leahy Scale: Fair Sitting balance - Comments: Varying assist needed, Min guard-Mod A due to poor balance reactions and LOB in all directions. Standing balance support: Bilateral upper extremity supported, During functional activity Standing balance-Leahy Scale: Poor Standing balance comment: able to ambulate very short distance with HHA, very unsteady and improved with use of RW.     Special needs/care consideration Designated visitors are son, [name], and grand daughter, NIcki Family have been staying with her 24/7 but I have requested they stay only during visiting hours on cir [place]:  (son and daughter)  Lives With: Family (son and daughter) Available Help at Discharge: Family, Available 24 hours/day (son, daughter and granddaughter) Type of Home: House Home Layout: One level Home Access: Level entry Bathroom Shower/Tub: Tub/shower unit, Architectural technologist: Standard Bathroom Accessibility: Yes How Accessible: Accessible via walker [place]: No Additional Comments: worked for [employer] long for a time   Discharge Living Setting Plans for Discharge Living Setting: Patient's home, Lives with (comment) (son and daughter) Type of Home at Discharge: House Discharge Home Layout: One level Discharge Home Access: Level entry Discharge Bathroom Shower/Tub: [place] unit Discharge Bathroom Toilet: Standard Discharge Bathroom Accessibility: Yes How Accessible: Accessible  via walker Does the patient have any problems obtaining your medications?: No   Social/Family/Support Systems Patient Roles:  Parent Contact Information: son , [name] and granddaughter, Nicki Anticipated Caregiver: Thera Flake and Nicki Anticipated Caregiver's Contact Information: see above Ability/Limitations of Caregiver: [name] works days, Event organiser can give supervision, [name] is a Medical sales representative Availability: 24/7 Discharge Plan Discussed with Primary Caregiver: Yes Is Caregiver In Agreement with Plan?: Yes Does Caregiver/Family have Issues with Lodging/Transportation while Pt is in Rehab?: No    NIcki, grand daughter will not work her summer job if patient discharges before July 29 th. She is managing patient rehab needs and care afterwards. She is a PTA   Goals Patient/Family Goal for Rehab: min/Supervision PT, OT, and SLP  Expected length of stay: ELOS 10 to 14 days Pt/Family Agrees to Admission and willing to participate: Yes Program Orientation Provided & Reviewed with Pt/Caregiver Including Roles  & Responsibilities: Yes   Decrease burden of Care through IP rehab admission: n/a   Possible need for SNF placement upon discharge:not anticipated   Patient Condition: This patient's medical and functional status has changed since the consult dated: 08/23/2019 in which the Rehabilitation Physician determined and documented that the patient's condition is appropriate for intensive rehabilitative care in an inpatient rehabilitation facility. See "History of Present Illness" (above) for medical update. Functional changes are: min to mod assist. Patient's medical and functional status update has been discussed with the Rehabilitation physician and patient remains appropriate for inpatient rehabilitation. Will admit to inpatient rehab today.   Preadmission Screen Completed By:  Cleatrice Burke, RN, 08/31/2019 11:39 AM ______________________________________________________________________   Discussed status with Dr. Posey Pronto on 08/31/2019 at  1140 and received approval for admission today.   Admission Coordinator:   Cleatrice Burke, time 5170 Date 08/31/2019         Revision History                          Note Details  Author Posey Pronto, Domenick Bookbinder, MD File Time 08/31/2019 11:55 AM  Author Type Physician Status Addendum  Last Editor Jamse Arn, MD Service Physical Medicine and Rehabilitation

## 2019-09-01 ENCOUNTER — Inpatient Hospital Stay (HOSPITAL_COMMUNITY): Payer: Medicare Other | Admitting: Physical Therapy

## 2019-09-01 ENCOUNTER — Inpatient Hospital Stay (HOSPITAL_COMMUNITY): Payer: Medicare Other | Admitting: Speech Pathology

## 2019-09-01 ENCOUNTER — Inpatient Hospital Stay (HOSPITAL_COMMUNITY): Payer: Medicare Other | Admitting: Occupational Therapy

## 2019-09-01 DIAGNOSIS — I63511 Cerebral infarction due to unspecified occlusion or stenosis of right middle cerebral artery: Secondary | ICD-10-CM

## 2019-09-01 LAB — CBC WITH DIFFERENTIAL/PLATELET
Abs Immature Granulocytes: 0.04 10*3/uL (ref 0.00–0.07)
Basophils Absolute: 0 10*3/uL (ref 0.0–0.1)
Basophils Relative: 0 %
Eosinophils Absolute: 0.1 10*3/uL (ref 0.0–0.5)
Eosinophils Relative: 2 %
HCT: 29.4 % — ABNORMAL LOW (ref 36.0–46.0)
Hemoglobin: 9.5 g/dL — ABNORMAL LOW (ref 12.0–15.0)
Immature Granulocytes: 1 %
Lymphocytes Relative: 18 %
Lymphs Abs: 1.3 10*3/uL (ref 0.7–4.0)
MCH: 29.4 pg (ref 26.0–34.0)
MCHC: 32.3 g/dL (ref 30.0–36.0)
MCV: 91 fL (ref 80.0–100.0)
Monocytes Absolute: 0.7 10*3/uL (ref 0.1–1.0)
Monocytes Relative: 10 %
Neutro Abs: 5.1 10*3/uL (ref 1.7–7.7)
Neutrophils Relative %: 69 %
Platelets: 245 10*3/uL (ref 150–400)
RBC: 3.23 MIL/uL — ABNORMAL LOW (ref 3.87–5.11)
RDW: 14.9 % (ref 11.5–15.5)
WBC: 7.3 10*3/uL (ref 4.0–10.5)
nRBC: 0 % (ref 0.0–0.2)

## 2019-09-01 LAB — GLUCOSE, CAPILLARY
Glucose-Capillary: 79 mg/dL (ref 70–99)
Glucose-Capillary: 120 mg/dL — ABNORMAL HIGH (ref 70–99)
Glucose-Capillary: 171 mg/dL — ABNORMAL HIGH (ref 70–99)
Glucose-Capillary: 158 mg/dL — ABNORMAL HIGH (ref 70–99)

## 2019-09-01 LAB — COMPREHENSIVE METABOLIC PANEL
ALT: 15 U/L (ref 0–44)
AST: 16 U/L (ref 15–41)
Albumin: 2.7 g/dL — ABNORMAL LOW (ref 3.5–5.0)
Alkaline Phosphatase: 68 U/L (ref 38–126)
Anion gap: 11 (ref 5–15)
BUN: 7 mg/dL — ABNORMAL LOW (ref 8–23)
CO2: 24 mmol/L (ref 22–32)
Calcium: 8.5 mg/dL — ABNORMAL LOW (ref 8.9–10.3)
Chloride: 104 mmol/L (ref 98–111)
Creatinine, Ser: 0.77 mg/dL (ref 0.44–1.00)
GFR calc Af Amer: >60 mL/min (ref >60)
GFR calc non Af Amer: >60 mL/min (ref >60)
Glucose, Bld: 77 mg/dL (ref 70–99)
Potassium: 3.3 mmol/L — ABNORMAL LOW (ref 3.5–5.1)
Sodium: 139 mmol/L (ref 135–145)
Total Bilirubin: 0.6 mg/dL (ref 0.3–1.2)
Total Protein: 5.9 g/dL — ABNORMAL LOW (ref 6.5–8.1)

## 2019-09-01 MED ORDER — POTASSIUM CHLORIDE CRYS ER 20 MEQ PO TBCR
20.0000 meq | EXTENDED_RELEASE_TABLET | Freq: Every day | ORAL | Status: DC
  Administered 2019-09-01 – 2019-09-09 (×9): 20 meq via ORAL
  Filled 2019-09-01 (×9): qty 1

## 2019-09-01 MED ORDER — LIVING WELL WITH DIABETES BOOK
Freq: Once | Status: AC
  Filled 2019-09-01: qty 1

## 2019-09-01 NOTE — Progress Notes (Signed)
Inpatient Rehabilitation  Patient information reviewed and entered into eRehab system by Krisy Dix M. Lenzi Marmo, M.A., CCC/SLP, PPS Coordinator.  Information including medical coding, functional ability and quality indicators will be reviewed and updated through discharge.    

## 2019-09-01 NOTE — Progress Notes (Signed)
Inpatient [place] Individual Statement of Services  Patient Name:  Jaclyn [name]  Date:  09/01/2019  Welcome to the [facility].  Our goal is to provide you with an individualized program based on your diagnosis and situation, designed to meet your specific needs.  With this comprehensive rehabilitation program, you will be expected to participate in at least 3 hours of rehabilitation therapies Monday-Friday, with modified therapy programming on the weekends.  Your rehabilitation program will include the following services:  Physical Therapy (PT), Occupational Therapy (OT), Speech Therapy (ST), 24 hour per day rehabilitation nursing, Therapeutic Recreaction (TR), Neuropsychology, Care Coordinator, Rehabilitation Medicine, Nutrition Services, Pharmacy Services and Other  Weekly team conferences will be held on Wednesday to discuss your progress.  Your Inpatient Rehabilitation Care Coordinator will talk with you frequently to get your input and to update you on team discussions.  Team conferences with you and your family in attendance may also be held.  Expected length of stay: 10-14 Days  Overall anticipated outcome: Min A to Supervision  Depending on your progress and recovery, your program may change. Your Inpatient Rehabilitation Care Coordinator will coordinate services and will keep you informed of any changes. Your Inpatient Rehabilitation Care Coordinator's name and contact numbers are listed  below.  The following services may also be recommended but are not provided by the [facility]:    [place] will be made to provide these services after discharge if needed.  Arrangements include referral to agencies that provide these services.  Your insurance has been verified to be:  [employer] Your primary doctor is:  Eliezer Lofts,  MD Pertinent information will be shared with your doctor and your insurance company.  Inpatient Rehabilitation Care Coordinator:  Erlene Quan, [place] or [phone]  Information discussed with and copy given to patient by: Dyanne Iha, 09/01/2019, 11:51 AM

## 2019-09-01 NOTE — Care Management (Signed)
Patient ID: Jaclyn [name], female   DOB: [birth year], 84 y.o.   MRN: [id number]  Met with the patient to review role of CM and collaboration with SW Margreta Journey) to facilitate preparation for discharge. Noted secondary stroke risk factors and A1C level. Discussed DM management and cmm diet along with [facility] dietary recommendations and survivor skills for DM/carbohydrate counting. Patient reported she was able to manage her insulin administration and does not anticipate issues post stroke however her son or grand-daughter could help with the meds if needed. Patient noted her son cooks some however they eat out more. Nursing to follow up with the son on education provided to the patient. Handouts left with the patient and a handbook on living well with diabetes. No there issues noted at present. Margarito Liner

## 2019-09-01 NOTE — Progress Notes (Addendum)
[place] PHYSICAL MEDICINE & REHABILITATION PROGRESS NOTE   Subjective/Complaints:  Very HOH but states "I feel well" but " I am tired"  Pocket talker not working   ROS- limited by communication   Objective:   EEG adult  Result Date: 08/30/2019 [name], MD     08/30/2019  4:31 PM Patient Name: Jaclyn [name] MRN: [id number] Epilepsy Attending: [name] Referring Physician/Provider: Dr Rosalin Hawking Date: 08/30/2019 Duration: 24.27 mins Patient history: 84yo F with acute right hemispheric strokes and episodes of transient aphasia. EEG to evaluate for seizure Level of alertness: Awake, asleep AEDs during EEG study: None Technical aspects: This EEG study was done with scalp electrodes positioned according to the 10-20 International system of electrode placement. Electrical activity was acquired at a sampling rate of 500Hz  and reviewed with a high frequency filter of 70Hz  and a low frequency filter of 1Hz . EEG data were recorded continuously and digitally stored. Description: The posterior dominant rhythm consists of 7.5-8 Hz activity of moderate voltage (25-35 uV) seen predominantly in posterior head regions, symmetric and reactive to eye opening and eye closing. Sleep was characterized by vertex waves, sleep spindles (12 to 14 Hz), maximal frontocentral region. Hyperventilation and photic stimulation were not performed.   IMPRESSION: This study is within normal limits. No seizures or epileptiform discharges were seen throughout the recording. [name]   Recent Labs    08/31/19 0352  WBC 6.4  HGB 9.1*  HCT 28.3*  PLT 207   Recent Labs    08/31/19 0352  NA 138  K 3.2*  CL 106  CO2 21*  GLUCOSE 173*  BUN 5*  CREATININE 0.61  CALCIUM 8.1*   No intake or output data in the 24 hours ending 09/01/19 0541   Physical Exam: Vital Signs Blood pressure (!) 164/73, pulse 68, temperature 98.3 F (36.8 C), temperature source Oral, resp. rate 17, height 5\' 2"  (1.575 m),  weight 71.5 kg, SpO2 98 %.   General: No acute distress Mood and affect are appropriate Heart: Regular rate and rhythm no rubs murmurs or extra sounds Lungs: Clear to auscultation, breathing unlabored, no rales or wheezes Abdomen: Positive bowel sounds, soft nontender to palpation, nondistended Extremities: No clubbing, cyanosis, or edema Skin: No evidence of breakdown, no evidence of rash Neurologic: Cranial nerves II through XII intact, motor strength is 5/5 in bilateral deltoid, bicep, tricep, grip, hip flexor, knee extensors, ankle dorsiflexor and plantar flexor Sensory exam normal sensation to light touch and proprioception in bilateral upper and lower extremities Cerebellar exam normal finger to nose to finger as well as heel to shin in bilateral upper and lower extremities Musculoskeletal: Full range of motion in all 4 extremities. No joint swelling    Assessment/Plan: 1. Functional deficits secondary to RIght MCA infarct  which require 3+ hours per day of interdisciplinary therapy in a comprehensive inpatient rehab setting.  Physiatrist is providing close team supervision and 24 hour management of active medical problems listed below.  Physiatrist and rehab team continue to assess barriers to discharge/monitor patient progress toward functional and medical goals  Care Tool:  Bathing              Bathing assist       Upper Body Dressing/Undressing Upper body dressing   What is the patient wearing?: Hospital gown only    Upper body assist      Lower Body Dressing/Undressing Lower body dressing      What is the patient wearing?:  Hospital gown only     Lower body assist       Toileting Toileting    Toileting assist       Transfers Chair/bed transfer  Transfers assist           Locomotion Ambulation   Ambulation assist              Walk 10 feet activity   Assist           Walk 50 feet activity   Assist           Walk  150 feet activity   Assist           Walk 10 feet on uneven surface  activity   Assist           Wheelchair     Assist               Wheelchair 50 feet with 2 turns activity    Assist            Wheelchair 150 feet activity     Assist          Blood pressure (!) 164/73, pulse 68, temperature 98.3 F (36.8 C), temperature source Oral, resp. rate 17, height 5\' 2"  (1.575 m), weight 71.5 kg, SpO2 98 %.  Medical Problem List and Plan: 1.  Left side weakness secondary to right MCA small subcortical infarct in the setting of severe intracranial large vessel disease with high-grade right ICA stenosis.  Status post right CEA             -patient may shower if incision covered             -ELOS/Goals: 8-12 days/Supervision             Admit to CIR 2.  Antithrombotics: -DVT/anticoagulation: Eliquis             -antiplatelet therapy: Aspirin 81 mg daily 3. Pain Management: Oxycodone as needed 4. Mood: Cymbalta 60 mg daily             -antipsychotic agents: N/A 5. Neuropsych: This patient is ?fully capable of making decisions on her own behalf. 6. Skin/Wound Care: Routine skin checks 7. Fluids/Electrolytes/Nutrition: Routine in and outs. CMP ordered 8.  Orthostasis after CEA.  ProAmatine 2.5 mg 3 times daily.             Monitor with increased activity CHeck orthostatic vitials, BP elevated in bed  9.  Diabetes mellitus.  Hemoglobin A1c 14.2.  NovoLog 6 units 3 times daily, Levemir 40 units twice daily, Tradjenta 5 mg daily.  Provide diabetic teaching          CBG (last 3)  Recent Labs    08/31/19 1605 08/31/19 2128 09/01/19 0614  GLUCAP 367* 215* 79  Add bedtime snack to avoid am hypoglycemia 10.  Hypothyroidism. Continue Synthroid  11.  Hyperlipidemia.  Lipitor 12.  Recurrent UTIs. Tripmex 100 mg daily  13.  Hypokalemia K+ 3.2 supplement with 49meq daily , no diuretics on board Recheck in am  14.  Hypertension  LOS: 1 days A FACE TO  FACE EVALUATION WAS PERFORMED  Charlett Blake 09/01/2019, 5:41 AM

## 2019-09-01 NOTE — Evaluation (Signed)
Speech Language Pathology Assessment and Plan  Patient Details  Name: Jaclyn [name] MRN: [id number] Date of Birth: 01-17-1936  SLP Diagnosis: Cognitive Impairments  Rehab Potential: Good ELOS: 7-10 days    Today's Date: 09/01/2019 SLP Individual Time: 0730-0830 SLP Individual Time Calculation (min): 60 min   Hospital Problem: Principal Problem:   Right middle cerebral artery stroke [facility])  Past Medical History:  Past Medical History:  Diagnosis Date  . Atrial fibrillation ([place])   . Benign neoplasm of colon   . Carotid artery occlusion    60-79% right ICA stenosis  . Carotid bruit   . Cerebrovascular disease, unspecified   . Difficult intubation 2008   During surgery to remove large polyp  . Diverticulosis of colon (without mention of hemorrhage)   . Dysthymic disorder   . Fibromyalgia   . Headache(784.0)   . Insomnia, unspecified   . Interstitial cystitis   . Myalgia and myositis, unspecified   . Osteoarthrosis, unspecified whether generalized or localized, unspecified site   . Other and unspecified hyperlipidemia   . Other chest pain   . Other specified benign mammary dysplasias   . TIA (transient ischemic attack)   . Type II or unspecified type diabetes mellitus without mention of complication, not stated as uncontrolled   . Unspecified essential hypertension   . Unspecified hypothyroidism   . Unspecified vitamin D deficiency   . Urinary tract infection, site not specified    Past Surgical History:  Past Surgical History:  Procedure Laterality Date  . ABDOMINAL HYSTERECTOMY  1988   cervical dysplasia  . CARDIAC CATHETERIZATION  02/19/06   EF 60%  . COLONOSCOPY    . ENDARTERECTOMY Right 08/26/2019   Procedure: RIGHT CAROTID ENDARTERECTOMY;  Surgeon: Rosetta Posner, MD;  Location: [place];  Service: Vascular;  Laterality: Right;  . PATCH ANGIOPLASTY Right 08/26/2019   Procedure: PATCH ANGIOPLASTY OF RIGHT COMMON CAROTID ARTERY USING HEMASHIELD PLATINUM FINESSE PATCH;   Surgeon: Rosetta Posner, MD;  Location: [place];  Service: Vascular;  Laterality: Right;  . RIGHT COLECTOMY  2005   for villous adenoma of the cecum Dr.streck  . VESICOVAGINAL FISTULA CLOSURE W/ TAH  1990   w/ cystocele &retocele repairs Dr. Ree Edman    Assessment / Plan / Recommendation Clinical Impression   HPI: Jaclyn [name] is an 84 year old right-handed female with history of atrial fibrillation maintained on Eliquis, bilateral ICA stenosis, recurrent UTIs, fibromyalgia, diabetes mellitus, hyperlipidemia, multiple TIAs without residual weakness. History taken from chart review and sign due to [facility] and cognition. Patient lives with son.  1 level home.  Son works during the day.  Independent with assistive device.  Cares for herself needing only limited assistance for dressing at times.  She does not drive. She presented on 08/21/19 with left hemiparesis and AMS. She was found down by her son.  Cranial CT scan unremarkable for acute intracranial process.  Patient did not receive TPA.  MRI of the brain showed a 6 mm acute ischemic nonhemorrhagic periventricular white matter infarction involving the posterior right frontal corona radiata centrum semiovale.  CT angiogram of head and neck negative for large vessel occlusion.  Echocardiogram with EF of 70%. No wall motion abnormalities.  EEG x2 negative.  Admission chemistry sodium 132, ammonia level 26, glucose 360, hemoglobin 11.4 urinalysis negative nitrite hemoglobin A1c of 14.2.  Hospital course further complicated by right carotid stenosis and patient underwent right carotid enterectomy with Dacron patch angioplasty on 08/26/19 per Dr. Donnetta Hutching complicated by postoperative hypotension  requiring dopamine for 24 hours.  Neurology follow-up and patient was cleared to resume her Eliquis as prior to admission as well as the addition of low-dose aspirin.  Tolerating a regular diet.  Pt admitted to [facility] CIR 08/31/19 and SLP evaluation completed 09/01/19 with results as  follows:  Although no family was present to determine baseline, pt presents with mild-moderate cognitive impairments further exacerbated by hearing loss, primarily impacting her attention, working memory, intellectual awareness and problem solving. Pt required Min A question cues to identify any physical or cognitive impairments since her stroke; she notes a general slowness and decreased memory. She verbally recalled 3/4 words from delayed recall task without cues, and the additional word with a category cue. She also verbalized hospital safety precautions without interventions and was oriented X4. Pt's scores on Cognistat evaluation indicated severe impairment on visual construction subtest and moderate impairments in attention. Mild impairments noted with calculations, although pt reports baseline deficits in that area. Voice amplifier was donned throughout session; amplifier in combination with use of slower rate of speech aided in better hearing and auditory comprehension. Expressive and receptive language determined WFL and speech was fully intelligible and fluent throughout session.  Pt would benefit from skilled ST while inpatient to maximize carryover of new information related to acute deficits this admission and increase safety and functional independence prior to discharge home.    Skilled Therapeutic Interventions          Cognitive-linguistic evaluation was administered and results were reviewed with pt (please see above for details regarding results).    SLP Assessment  Patient will need skilled [facility] during CIR admission    Recommendations  Patient destination: Home Follow up Recommendations: Home Health SLP;24 hour supervision/assistance Equipment Recommended: None recommended by SLP    SLP Frequency 3 to 5 out of 7 days   SLP Duration  SLP Intensity  SLP Treatment/Interventions 7-10 days  Minumum of 1-2 x/day, 30 to 90 minutes  Cognitive  remediation/compensation;Cueing hierarchy;Functional tasks;Patient/family education;Therapeutic Activities;Internal/external aids    Pain Pain Assessment Pain Scale: 0-10 Pain Score: 0-No pain      SLP Evaluation Cognition Overall Cognitive Status: No family/caregiver present to determine baseline cognitive functioning Arousal/Alertness: Awake/alert Orientation Level: Oriented X4 Attention: Sustained Sustained Attention: Impaired Sustained Attention Impairment: Verbal basic;Functional basic Memory: Impaired Memory Impairment: Retrieval deficit;Decreased short term memory (working memory) Decreased Short Term Memory: Verbal basic;Functional basic Awareness: Impaired Awareness Impairment: Intellectual impairment Problem Solving: Impaired Problem Solving Impairment: Verbal basic;Functional basic Executive Function: Writer: Impaired Organizing Impairment: Functional basic Safety/Judgment: Appears intact  Comprehension Auditory Comprehension Overall Auditory Comprehension: Appears within functional limits for tasks assessed (pt with significant hearing loss, needs to wear amplifer for best comprehension) Conversation: Simple Visual Recognition/Discrimination Discrimination: Not tested Reading Comprehension Reading Status: Not tested Expression Expression Primary Mode of Expression: Verbal Verbal Expression Overall Verbal Expression: Appears within functional limits for tasks assessed Non-Verbal Means of Communication: Not applicable Written Expression Written Expression: Not tested Oral Motor Oral Motor/Sensory Function Overall Oral Motor/Sensory Function: Within functional limits Motor Speech Overall Motor Speech: Appears within functional limits for tasks assessed Phonation: Normal Intelligibility: Intelligible Motor Planning: Witnin functional limits  Intelligibility: Intelligible   Short Term Goals: Week 1: SLP Short Term Goal 1 (Week 1):  STG=LTG due to estimated short length of stay  Refer to Care Plan for Long Term Goals  Recommendations for other services: None   Discharge Criteria: Patient will be discharged from SLP if patient refuses treatment  3 consecutive times without medical reason, if treatment goals not met, if there is a change in medical status, if patient makes no progress towards goals or if patient is discharged from hospital.  The above assessment, treatment plan, treatment alternatives and goals were discussed and mutually agreed upon: by patient  Arbutus Leas 09/01/2019, 12:34 PM

## 2019-09-01 NOTE — Evaluation (Signed)
Physical Therapy Assessment and Plan  Patient Details  Name: Jaclyn [name] MRN: [id number] Date of Birth: May 06, 1935  PT Diagnosis: Abnormality of gait, Coordination disorder, Hemiplegia non-dominant, Muscle spasms and Muscle weakness Rehab Potential: Good ELOS: 7-10 days   Today's Date: 09/01/2019 PT Individual Time: 1300-1411 PT Individual Time Calculation (min): 71 min    Hospital Problem: Principal Problem:   Right middle cerebral artery stroke [facility])   Past Medical History:  Past Medical History:  Diagnosis Date  . Atrial fibrillation ([place])   . Benign neoplasm of colon   . Carotid artery occlusion    60-79% right ICA stenosis  . Carotid bruit   . Cerebrovascular disease, unspecified   . Difficult intubation 2008   During surgery to remove large polyp  . Diverticulosis of colon (without mention of hemorrhage)   . Dysthymic disorder   . Fibromyalgia   . Headache(784.0)   . Insomnia, unspecified   . Interstitial cystitis   . Myalgia and myositis, unspecified   . Osteoarthrosis, unspecified whether generalized or localized, unspecified site   . Other and unspecified hyperlipidemia   . Other chest pain   . Other specified benign mammary dysplasias   . TIA (transient ischemic attack)   . Type II or unspecified type diabetes mellitus without mention of complication, not stated as uncontrolled   . Unspecified essential hypertension   . Unspecified hypothyroidism   . Unspecified vitamin D deficiency   . Urinary tract infection, site not specified    Past Surgical History:  Past Surgical History:  Procedure Laterality Date  . ABDOMINAL HYSTERECTOMY  1988   cervical dysplasia  . CARDIAC CATHETERIZATION  02/19/06   EF 60%  . COLONOSCOPY    . ENDARTERECTOMY Right 08/26/2019   Procedure: RIGHT CAROTID ENDARTERECTOMY;  Surgeon: Rosetta Posner, MD;  Location: [place];  Service: Vascular;  Laterality: Right;  . PATCH ANGIOPLASTY Right 08/26/2019   Procedure: PATCH ANGIOPLASTY OF  RIGHT COMMON CAROTID ARTERY USING HEMASHIELD PLATINUM FINESSE PATCH;  Surgeon: Rosetta Posner, MD;  Location: [place];  Service: Vascular;  Laterality: Right;  . RIGHT COLECTOMY  2005   for villous adenoma of the cecum Dr.streck  . VESICOVAGINAL FISTULA CLOSURE W/ TAH  1990   w/ cystocele &retocele repairs Dr. Ree Edman    Assessment & Plan Clinical Impression: Patient is a 84 year old right-handed female with history of atrial fibrillation maintained on Eliquis, bilateral ICA stenosis, recurrent UTIs, fibromyalgia, diabetes mellitus, hyperlipidemia, multiple TIAs without residual weakness. History taken from chart review and sign due to [facility] and cognition. Patient lives with son.  1 level home.  Son works during the day.  Independent with assistive device.  Cares for herself needing only limited assistance for dressing at times.  She does not drive. She presented on 08/21/19 with left hemiparesis and AMS. She was found down by her son.  Cranial CT scan unremarkable for acute intracranial process.  Patient did not receive TPA.  MRI of the brain showed a 6 mm acute ischemic nonhemorrhagic periventricular white matter infarction involving the posterior right frontal corona radiata centrum semiovale.  CT angiogram of head and neck negative for large vessel occlusion.  Echocardiogram with EF of 70%. No wall motion abnormalities.  EEG x2 negative.  Admission chemistry sodium 132, ammonia level 26, glucose 360, hemoglobin 11.4 urinalysis negative nitrite hemoglobin A1c of 14.2.  Hospital course further complicated by right carotid stenosis and patient underwent right carotid enterectomy with Dacron patch angioplasty on 08/26/19 per Dr. Donnetta Hutching complicated  by postoperative hypotension requiring dopamine for 24 hours.  Neurology follow-up and patient was cleared to resume her Eliquis as prior to admission as well as the addition of low-dose aspirin.  Tolerating a regular diet.    Patient transferred to CIR on 08/31/2019 .    Patient currently requires min with mobility secondary to muscle weakness and muscle joint tightness, decreased cardiorespiratoy endurance, unbalanced muscle activation and decreased coordination, decreased attention to left, decreased attention, decreased awareness, decreased problem solving, decreased safety awareness, decreased memory and delayed processing and decreased sitting balance, decreased standing balance, hemiplegia and decreased balance strategies.  Prior to hospitalization, patient was modified independent  with mobility and lived with Family, Son in a House home.  Home access is  Level entry.  Patient will benefit from skilled PT intervention to maximize safe functional mobility, minimize fall risk and decrease caregiver burden for planned discharge home with intermittent assist.  Anticipate patient will benefit from follow up [facility] at discharge.  PT - End of Session Activity Tolerance: Tolerates 10 - 20 min activity with multiple rests Endurance Deficit: Yes Endurance Deficit Description: fatigue with adl PT Assessment Rehab Potential (ACUTE/IP ONLY): Good PT Barriers to Discharge: Decreased caregiver support;Home environment access/layout;Insurance for SNF coverage PT Patient demonstrates impairments in the following area(s): Balance;Behavior;Endurance;Motor;Perception;Safety;Sensory PT Transfers Functional Problem(s): Bed Mobility;Bed to Chair;Car;Floor;Furniture PT Locomotion Functional Problem(s): Ambulation;Wheelchair Mobility;Stairs PT Plan PT Intensity: Minimum of 1-2 x/day ,45 to 90 minutes PT Frequency: 5 out of 7 days PT Duration Estimated Length of Stay: 7-10 days PT Treatment/Interventions: Ambulation/gait training;Community reintegration;DME/adaptive equipment instruction;Neuromuscular re-education;Psychosocial support;Stair training;Wheelchair propulsion/positioning;UE/LE Strength taining/ROM;Balance/vestibular training;Functional electrical stimulation;Discharge  planning;Pain management;Skin care/wound management;Therapeutic Activities;UE/LE Coordination activities;Cognitive remediation/compensation;Disease management/prevention;Functional mobility training;Patient/family education;Splinting/orthotics;Therapeutic Exercise;Visual/perceptual remediation/compensation PT Transfers Anticipated Outcome(s): Mod I with LRAD PT Locomotion Anticipated Outcome(s): Supervision assist wth ambulation using LRAD PT Recommendation Recommendations for Other Services: Therapeutic Recreation consult Therapeutic Recreation Interventions: Stress management;Outing/community reintergration Follow Up Recommendations: Home health PT Patient destination: Home Equipment Recommended: To be determined  Skilled Therapeutic Intervention Pt received supine in bed and agreeable to PT. Supine>sit transfer with min assist at trunk. PT instructed patient in PT Evaluation and initiated treatment intervention; see below for results. PT educated patient in [place], rehab potential, rehab goals, and discharge recommendations.   PT instructed pt in TUG: 65sec (average of 3 trials; >13.5 sec indicates increased fall risk) with RW  Pt performed 5xSTS: 23 sec (>15 sec indicates increased fall risk) with RW.  Patient demonstrates increased fall risk as noted by score of   21/56 on Berg Balance Scale.  (<36= high risk for falls, close to 100%; 37-45 significant >80%; 46-51 moderate >50%; 52-55 lower >25%)   Pt performed stairs with BUE support and step to gait pattern leading with the RLE with ascent and descent; unable to perform without UE support . Car transfer training with mod assist for safety and cues for sit>pivot technique.   WC mobility with min assist and cues to improve force of LUE to prevent veer to the L.   Pt returned to room and performed stand pivot transfer with HHA to bed . Sit>supine completed with supervision assist, and left supine in bed with call bell in reach and all needs  met.      PT Evaluation Precautions/Restrictions Precautions Precautions: Fall Restrictions Weight Bearing Restrictions: No General   Vital Signs Pain Pain Assessment Pain Scale: 0-10 Pain Score: 0-No pain Home Living/Prior Functioning Home Living Living Arrangements: Children;Other relatives Available Help at Discharge: Family;Available 24  hours/day Type of Home: House Home Access: Level entry Home Layout: One level Bathroom Shower/Tub: Tub/shower unit;Curtain Bathroom Toilet: Standard Bathroom Accessibility: Yes Additional Comments: worked for [employer] long for a time  Lives With: Family;Son Prior Function Level of Independence: Independent with basic ADLs;Independent with gait;Independent with transfers Driving: No Vocation: Retired Vision/Perception  Vision - Risk analyst: Within Passenger transport manager Range of Motion: Within Functional Limits Alignment/Gaze Preference: Within Defined Limits Tracking/Visual Pursuits: Able to track stimulus in all quads without difficulty Perception Perception: Impaired Praxis Praxis: Intact  Cognition Overall Cognitive Status: No family/caregiver present to determine baseline cognitive functioning Arousal/Alertness: Awake/alert Orientation Level: Oriented X4 Attention: Sustained Sustained Attention: Impaired Sustained Attention Impairment: Verbal basic;Functional basic Memory: Impaired Memory Impairment: Retrieval deficit;Decreased short term memory (working memory) Decreased Short Term Memory: Verbal basic;Functional basic Immediate Memory Recall: Sock;Blue;Bed Memory Recall Sock: Not able to recall Memory Recall Blue: With Cue Memory Recall Bed: With Cue Awareness: Impaired Awareness Impairment: Intellectual impairment Problem Solving: Impaired Problem Solving Impairment: Verbal basic;Functional basic Executive Function: Writer: Impaired Organizing Impairment: Functional  basic Safety/Judgment: Health visitor Light Touch: Appears Intact Coordination Gross Motor Movements are Fluid and Coordinated: No Fine Motor Movements are Fluid and Coordinated: No Finger Nose Finger Test: mild dysmetria left Heel Shin Test: mild dysmetria on the L 9 Hole Peg Test: R = 29 sec, L = 54 sec       box and blocks:   R = 39, L = 34 Motor  Motor Motor: Hemiplegia;Ataxia Motor - Skilled Clinical Observations: mild weakness left coordination deficits  Mobility Bed Mobility Bed Mobility: Supine to Sit;Sit to Supine Supine to Sit: Contact Guard/Touching assist Sit to Supine: Minimal Assistance - Patient > 75% Transfers Transfers: Sit to Stand;Stand Pivot Transfers Sit to Stand: Minimal Assistance - Patient > 75% Stand Pivot Transfers: Minimal Assistance - Patient > 75% Stand Pivot Transfer Details: Verbal cues for precautions/safety Transfer (Assistive device): Rolling walker Locomotion  Gait Ambulation: Yes Gait Assistance: Minimal Assistance - Patient > 75% Gait Distance (Feet): 150 Feet Assistive device: Rolling walker Gait Gait: Yes Gait Pattern: Impaired Gait Pattern: Ataxic Gait velocity: pt very distracted throughout Stairs / Additional Locomotion Stairs: No (required UE support) Product manager Mobility: Yes Wheelchair Assistance: Minimal assistance - Patient >75% Wheelchair Propulsion: Both upper extremities Wheelchair Parts Management: Needs assistance Distance: 150  Trunk/Postural Assessment  Cervical Assessment Cervical Assessment: Within Functional Limits Thoracic Assessment Thoracic Assessment: Within Functional Limits Lumbar Assessment Lumbar Assessment: Within Functional Limits  Balance Balance Balance Assessed: Yes Standardized Balance Assessment Standardized Balance Assessment: Berg Balance Test;Timed Up and Go Test Berg Balance Test Sit to Stand: Able to stand using hands after several  tries Standing Unsupported: Able to stand 2 minutes with supervision Sitting with Back Unsupported but Feet Supported on Floor or Stool: Able to sit 2 minutes under supervision Stand to Sit: Controls descent by using hands Transfers: Able to transfer with verbal cueing and /or supervision Standing Unsupported with Eyes Closed: Able to stand 3 seconds Standing Ubsupported with Feet Together: Needs help to attain position but able to stand for 30 seconds with feet together From Standing, Reach Forward with Outstretched Arm: Can reach forward >5 cm safely (2") From Standing Position, Pick up Object from Floor: Unable to pick up and needs supervision From Standing Position, Turn to Look Behind Over each Shoulder: Turn sideways only but maintains balance Turn 360 Degrees: Needs assistance while turning Standing Unsupported, Alternately Place Feet on Step/Stool: Needs assistance  to keep from falling or unable to try Standing Unsupported, One Foot in Front: Loses balance while stepping or standing Standing on One Leg: Unable to try or needs assist to prevent fall Total Score: 21 Timed Up and Go Test TUG: Normal TUG Normal TUG (seconds): 65 Static Sitting Balance Static Sitting - Level of Assistance: 5: Stand by assistance Dynamic Sitting Balance Dynamic Sitting - Balance Support: No upper extremity supported Dynamic Sitting - Level of Assistance: 5: Stand by assistance Static Standing Balance Static Standing - Balance Support: No upper extremity supported Static Standing - Level of Assistance: 5: Stand by assistance Dynamic Standing Balance Dynamic Standing - Balance Support: During functional activity;No upper extremity supported Dynamic Standing - Level of Assistance: 4: Min assist Extremity Assessment  RUE Assessment RUE Assessment: Within Functional Limits LUE Assessment Passive Range of Motion (PROM) Comments: WFL Active Range of Motion (AROM) Comments: 3/4 to full prox, full  distal General Strength Comments: 3/5 prox, 4+/5 distal RLE Assessment RLE Assessment: Within Functional Limits General Strength Comments: grossly 4+/5 to 5/5 proximal to distal LLE Assessment LLE Assessment: Exceptions to [place] General Strength Comments: grossly 4/5 proximal to distal    Refer to Care Plan for Long Term Goals  Recommendations for other services: Therapeutic Recreation  Stress management and Outing/community reintegration  Discharge Criteria: Patient will be discharged from PT if patient refuses treatment 3 consecutive times without medical reason, if treatment goals not met, if there is a change in medical status, if patient makes no progress towards goals or if patient is discharged from hospital.  The above assessment, treatment plan, treatment alternatives and goals were discussed and mutually agreed upon: by patient  Lorie Phenix 09/01/2019, 2:16 PM

## 2019-09-01 NOTE — Plan of Care (Signed)
°  Problem: RH Balance Goal: LTG Patient will maintain dynamic sitting balance (PT) Description: LTG:  Patient will maintain dynamic sitting balance with assistance during mobility activities (PT) Flowsheets (Taken 09/01/2019 1357) LTG: Pt will maintain dynamic sitting balance during mobility activities with:: Independent with assistive device  Goal: LTG Patient will maintain dynamic standing balance (PT) Description: LTG:  Patient will maintain dynamic standing balance with assistance during mobility activities (PT) Flowsheets (Taken 09/01/2019 1357) LTG: Pt will maintain dynamic standing balance during mobility activities with:: Supervision/Verbal cueing   Problem: RH Bed Mobility Goal: LTG Patient will perform bed mobility with assist (PT) Description: LTG: Patient will perform bed mobility with assistance, with/without cues (PT). Flowsheets (Taken 09/01/2019 1357) LTG: Pt will perform bed mobility with assistance level of: Supervision/Verbal cueing   Problem: RH Bed to Chair Transfers Goal: LTG Patient will perform bed/chair transfers w/assist (PT) Description: LTG: Patient will perform bed to chair transfers with assistance (PT). Flowsheets (Taken 09/01/2019 1357) LTG: Pt will perform Bed to Chair Transfers with assistance level: Independent with assistive device    Problem: RH Ambulation Goal: LTG Patient will ambulate in controlled environment (PT) Description: LTG: Patient will ambulate in a controlled environment, # of feet with assistance (PT). Flowsheets (Taken 09/01/2019 1357) LTG: Pt will ambulate in controlled environ  assist needed:: Supervision/Verbal cueing LTG: Ambulation distance in controlled environment: 178ft with LRAD Goal: LTG Patient will ambulate in home environment (PT) Description: LTG: Patient will ambulate in home environment, # of feet with assistance (PT). Flowsheets (Taken 09/01/2019 1357) LTG: Pt will ambulate in home environ  assist needed::  Supervision/Verbal cueing LTG: Ambulation distance in home environment: 55ft with LRAD   Problem: RH Wheelchair Mobility Goal: LTG Patient will propel w/c in controlled environment (PT) Description: LTG: Patient will propel wheelchair in controlled environment, # of feet with assist (PT) Flowsheets (Taken 09/01/2019 1357) LTG: Pt will propel w/c in controlled environ  assist needed:: Supervision/Verbal cueing LTG: Propel w/c distance in controlled environment: 152ft

## 2019-09-01 NOTE — Evaluation (Signed)
Occupational Therapy Assessment and Plan  Patient Details  Name: Jaclyn [name] MRN: [id number] Date of Birth: 10/27/1935  OT Diagnosis: hemiplegia affecting non-dominant side and muscle weakness (generalized) Rehab Potential: Rehab Potential (ACUTE ONLY): Good ELOS: 7-10 days   Today's Date: 09/01/2019 OT Individual Time: 1000-1115 OT Individual Time Calculation (min): 75 min     Hospital Problem: Principal Problem:   Right middle cerebral artery stroke [facility])   Past Medical History:  Past Medical History:  Diagnosis Date  . Atrial fibrillation ([place])   . Benign neoplasm of colon   . Carotid artery occlusion    60-79% right ICA stenosis  . Carotid bruit   . Cerebrovascular disease, unspecified   . Difficult intubation 2008   During surgery to remove large polyp  . Diverticulosis of colon (without mention of hemorrhage)   . Dysthymic disorder   . Fibromyalgia   . Headache(784.0)   . Insomnia, unspecified   . Interstitial cystitis   . Myalgia and myositis, unspecified   . Osteoarthrosis, unspecified whether generalized or localized, unspecified site   . Other and unspecified hyperlipidemia   . Other chest pain   . Other specified benign mammary dysplasias   . TIA (transient ischemic attack)   . Type II or unspecified type diabetes mellitus without mention of complication, not stated as uncontrolled   . Unspecified essential hypertension   . Unspecified hypothyroidism   . Unspecified vitamin D deficiency   . Urinary tract infection, site not specified    Past Surgical History:  Past Surgical History:  Procedure Laterality Date  . ABDOMINAL HYSTERECTOMY  1988   cervical dysplasia  . CARDIAC CATHETERIZATION  02/19/06   EF 60%  . COLONOSCOPY    . ENDARTERECTOMY Right 08/26/2019   Procedure: RIGHT CAROTID ENDARTERECTOMY;  Surgeon: Rosetta Posner, MD;  Location: [facility];  Service: Vascular;  Laterality: Right;  . PATCH ANGIOPLASTY Right 08/26/2019   Procedure: PATCH ANGIOPLASTY  OF RIGHT COMMON CAROTID ARTERY USING HEMASHIELD PLATINUM FINESSE PATCH;  Surgeon: Rosetta Posner, MD;  Location: [facility];  Service: Vascular;  Laterality: Right;  . RIGHT COLECTOMY  2005   for villous adenoma of the cecum Dr.streck  . VESICOVAGINAL FISTULA CLOSURE W/ TAH  1990   w/ cystocele &retocele repairs Dr. Ree Edman    Assessment & Plan Clinical Impression: Patient is a 84 y.o. year old female with history of atrial fibrillation maintained on Eliquis, bilateral ICA stenosis, recurrent UTIs, fibromyalgia, diabetes mellitus, hyperlipidemia, multiple TIAs without residual weakness. History taken from chart review and sign due to [facility] and cognition. Patient lives with son.  1 level home.  Son works during the day.  Independent with assistive device.  Cares for herself needing only limited assistance for dressing at times.  She does not drive. She presented on 08/21/19 with left hemiparesis and AMS. She was found down by her son.  Cranial CT scan unremarkable for acute intracranial process.  Patient did not receive TPA.  MRI of the brain showed a 6 mm acute ischemic nonhemorrhagic periventricular white matter infarction involving the posterior right frontal corona radiata centrum semiovale.  CT angiogram of head and neck negative for large vessel occlusion.  Echocardiogram with EF of 70%. No wall motion abnormalities.  EEG x2 negative.  Admission chemistry sodium 132, ammonia level 26, glucose 360, hemoglobin 11.4 urinalysis negative nitrite hemoglobin A1c of 14.2.  Hospital course further complicated by right carotid stenosis and patient underwent right carotid enterectomy with Dacron patch angioplasty on 08/26/19 per  Dr. Donnetta Hutching complicated by postoperative hypotension requiring dopamine for 24 hours.  Neurology follow-up and patient was cleared to resume her Eliquis as prior to admission as well as the addition of low-dose aspirin.  Tolerating a regular diet.    Patient transferred to CIR on 08/31/2019 .     Patient currently requires min with basic self-care skills and IADL secondary to unbalanced muscle activation and decreased coordination and decreased standing balance, decreased postural control and hemiplegia.  Prior to hospitalization, patient could complete ADL with modified independent .  Patient will benefit from skilled intervention to decrease level of assist with basic self-care skills, increase independence with basic self-care skills and increase level of independence with iADL prior to discharge home with care partner.  Anticipate patient will require 24 hour supervision and follow up outpatient.  OT - End of Session Activity Tolerance: Tolerates 30+ min activity with multiple rests Endurance Deficit: Yes Endurance Deficit Description: fatigue with adl OT Assessment Rehab Potential (ACUTE ONLY): Good OT Patient demonstrates impairments in the following area(s): Balance;Cognition;Endurance;Motor OT Basic ADL's Functional Problem(s): Grooming;Bathing;Dressing;Toileting;Eating OT Advanced ADL's Functional Problem(s): Simple Meal Preparation OT Transfers Functional Problem(s): Toilet;Tub/Shower OT Additional Impairment(s): Fuctional Use of Upper Extremity OT Plan OT Intensity: Minimum of 1-2 x/day, 45 to 90 minutes OT Frequency: 5 out of 7 days OT Duration/Estimated Length of Stay: 7-10 days OT Treatment/Interventions: Balance/vestibular training;Cognitive remediation/compensation;Discharge planning;DME/adaptive equipment instruction;Functional mobility training;Neuromuscular re-education;Patient/family education;Self Care/advanced ADL retraining;Therapeutic Activities;UE/LE Coordination activities;Visual/perceptual remediation/compensation OT Self Feeding Anticipated Outcome(s): mod I OT Basic Self-Care Anticipated Outcome(s): CS OT Toileting Anticipated Outcome(s): CS OT Bathroom Transfers Anticipated Outcome(s): CS OT Recommendation Patient destination: Home Follow Up  Recommendations: Outpatient OT Equipment Recommended: Tub/shower bench   Skilled Therapeutic Intervention Patient in bed, asleep but aroused with stimulation.  She denies pain, is HOH but does well with amplifier.  Evaluation completed as documented below.  She presents with left side weakness, coordination and balance deficits, mild visual/perceptual and memory impairments limiting her safety and ability to complete mobility and self care tasks independently.  Reviewed role of OT, plan of care, scheduling, goals for therapy and safety.  She is pleasant, cooperative and a good candidate for IP rehab programming to maximize independence and facilitate a safe return to home with son.  She participated in adl and mobility training this session. Short distance ambulation/SPT to/from bed, toilet and w/c completed with min A for balance and cues for left side.  toileting min A, UB dressing min A, LB dressing mod A seated edge of bed.  Grooming tasks at sink in stance with CGA and min cues for safety.  She returned to bed at close of session, bed alarm set and call bell and tray table in reach.    OT Evaluation Precautions/Restrictions  Precautions Precautions: Fall Restrictions Weight Bearing Restrictions: No Pain Pain Assessment Pain Scale: 0-10 Pain Score: 0-No pain Home Living/Prior Functioning Home Living Family/patient expects to be discharged to:: Private residence Living Arrangements: Children Available Help at Discharge: Family, Available 24 hours/day Type of Home: House Home Access: Level entry Home Layout: One level Bathroom Shower/Tub: Tub/shower unit, Architectural technologist: Programmer, systems: Yes Additional Comments: worked for [employer] long for a time  Lives With: Family, Son Prior Function Level of Independence: Independent with basic ADLs, Independent with gait, Independent with transfers Vocation: Retired ADL ADL Eating: Set up Where Assessed-Eating: Bed  level Grooming: Contact guard Where Assessed-Grooming: Standing at sink Upper Body Dressing: Minimal assistance Where Assessed-Upper Body Dressing: Edge of  bed Lower Body Dressing: Moderate assistance Where Assessed-Lower Body Dressing: Edge of bed Toileting: Minimal assistance Where Assessed-Toileting: Glass blower/designer: Psychiatric nurse Method: Counselling psychologist: Grab bars ADL Comments: patient declined bathing this session Vision Baseline Vision/History: Cataracts Patient Visual Report: No change from baseline Vision Assessment?: Yes Eye Alignment: Within Functional Limits Ocular Range of Motion: Within Functional Limits Alignment/Gaze Preference: Within Defined Limits Tracking/Visual Pursuits: Able to track stimulus in all quads without difficulty Perception  Perception: Impaired Praxis Praxis: Intact Cognition Overall Cognitive Status: Within Functional Limits for tasks assessed Arousal/Alertness: Awake/alert Orientation Level: Person;Place;Situation Person: Oriented Place: Oriented Situation: Oriented Year: 2021 Month: July Day of Week: Correct Memory: Impaired Memory Impairment: Retrieval deficit;Decreased short term memory (working memory) Decreased Short Term Memory: Verbal basic;Functional basic Immediate Memory Recall: Sock;Blue;Bed Memory Recall Sock: Not able to recall Memory Recall Blue: With Cue Memory Recall Bed: With Cue Attention: Sustained Sustained Attention: Impaired Sustained Attention Impairment: Verbal basic;Functional basic Awareness: Impaired Awareness Impairment: Intellectual impairment Problem Solving: Impaired Problem Solving Impairment: Verbal basic;Functional basic Executive Function: Writer: Impaired Organizing Impairment: Functional basic Safety/Judgment: Health visitor Light Touch: Appears Intact Coordination Gross Motor Movements are Fluid and  Coordinated: No Fine Motor Movements are Fluid and Coordinated: No Finger Nose Finger Test: mild dysmetria left 9 Hole Peg Test: R = 29 sec, L = 54 sec       box and blocks:   R = 39, L = 34 Motor  Motor Motor: Hemiplegia Motor - Skilled Clinical Observations: mild weakness left and occ LOB to left Mobility  Bed Mobility Bed Mobility: Supine to Sit;Sit to Supine Supine to Sit: Contact Guard/Touching assist Sit to Supine: Minimal Assistance - Patient > 75% Transfers Sit to Stand: Minimal Assistance - Patient > 75%  Trunk/Postural Assessment  Cervical Assessment Cervical Assessment: Within Functional Limits Thoracic Assessment Thoracic Assessment: Within Functional Limits Lumbar Assessment Lumbar Assessment: Within Functional Limits  Balance Balance Balance Assessed: Yes Static Sitting Balance Static Sitting - Level of Assistance: 5: Stand by assistance Dynamic Sitting Balance Dynamic Sitting - Level of Assistance: 5: Stand by assistance Static Standing Balance Static Standing - Level of Assistance: 5: Stand by assistance Dynamic Standing Balance Dynamic Standing - Level of Assistance: 4: Min assist Extremity/Trunk Assessment RUE Assessment RUE Assessment: Within Functional Limits LUE Assessment Passive Range of Motion (PROM) Comments: WFL Active Range of Motion (AROM) Comments: 3/4 to full prox, full distal General Strength Comments: 3/5 prox, 4+/5 distal     Refer to Care Plan for Long Term Goals  Recommendations for other services: None    Discharge Criteria: Patient will be discharged from OT if patient refuses treatment 3 consecutive times without medical reason, if treatment goals not met, if there is a change in medical status, if patient makes no progress towards goals or if patient is discharged from hospital.  The above assessment, treatment plan, treatment alternatives and goals were discussed and mutually agreed upon: by patient  Carlos Levering 09/01/2019, 12:52 PM

## 2019-09-01 NOTE — Progress Notes (Signed)
Inpatient Rehabilitation Care Coordinator Assessment and Plan  Patient Details  Name: Jaclyn [name] MRN: [id number] Date of Birth: 06-Jul-1935  Today's Date: 09/01/2019  Problem List:  Patient Active Problem List   Diagnosis Date Noted  . Right middle cerebral artery stroke ([place]) 08/31/2019  . Recurrent UTI   . Dyslipidemia   . Orthostasis   . Dysarthria   . Acute CVA (cerebrovascular accident) ([place]) 08/22/2019  . CVA (cerebral vascular accident) ([place]) 08/22/2019  . Vagina, candidiasis 03/10/2019  . Mild dementia ([place]) 03/10/2019  . Acute respiratory failure with hypoxia ([place]) 01/24/2019  . Acute metabolic encephalopathy 93/71/6967  . COVID-19 virus infection 01/15/2019  . Acute lower UTI 01/15/2019  . B12 deficiency 01/22/2018  . History of CVA (cerebrovascular accident) 06/20/2017  . Stenosis of right carotid artery   . Peripheral edema 07/29/2016  . Imbalance 04/07/2016  . Intertriginous candidiasis 04/26/2015  . Hard of hearing 04/12/2015  . TIA (transient ischemic attack) 04/10/2015  . History of recurrent UTIs 04/10/2015  . Dysuria 03/09/2015  . Tremor 05/11/2014  . Leg weakness, bilateral 05/11/2014  . Fatty liver 08/04/2013  . Personal history of colonic polyps 10/05/2012  . Esophageal reflux 10/05/2012  . Carotid stenosis, symptomatic, with infarction ([place]) 10/14/2010  . ANXIETY DEPRESSION 01/13/2008  . Essential hypertension, benign 01/13/2008  . PAROXYSMAL ATRIAL FIBRILLATION 01/13/2008  . Intracranial vascular stenosis 01/13/2008  . DIVERTICULOSIS OF COLON 01/13/2008  . Fibromyalgia 01/13/2008  . CHEST PAIN, ATYPICAL 01/13/2008  . Hypothyroidism 04/13/2007  . Poorly controlled type 2 diabetes mellitus with complication ([place]) 89/38/1017  . Vitamin D deficiency 04/13/2007  . Hyperlipidemia LDL goal <70 04/13/2007  . DEGENERATIVE JOINT DISEASE 04/13/2007   Past Medical History:  Past Medical History:  Diagnosis Date  . Atrial fibrillation ([place])   .  Benign neoplasm of colon   . Carotid artery occlusion    60-79% right ICA stenosis  . Carotid bruit   . Cerebrovascular disease, unspecified   . Difficult intubation 2008   During surgery to remove large polyp  . Diverticulosis of colon (without mention of hemorrhage)   . Dysthymic disorder   . Fibromyalgia   . Headache(784.0)   . Insomnia, unspecified   . Interstitial cystitis   . Myalgia and myositis, unspecified   . Osteoarthrosis, unspecified whether generalized or localized, unspecified site   . Other and unspecified hyperlipidemia   . Other chest pain   . Other specified benign mammary dysplasias   . TIA (transient ischemic attack)   . Type II or unspecified type diabetes mellitus without mention of complication, not stated as uncontrolled   . Unspecified essential hypertension   . Unspecified hypothyroidism   . Unspecified vitamin D deficiency   . Urinary tract infection, site not specified    Past Surgical History:  Past Surgical History:  Procedure Laterality Date  . ABDOMINAL HYSTERECTOMY  1988   cervical dysplasia  . CARDIAC CATHETERIZATION  02/19/06   EF 60%  . COLONOSCOPY    . ENDARTERECTOMY Right 08/26/2019   Procedure: RIGHT CAROTID ENDARTERECTOMY;  Surgeon: Rosetta Posner, MD;  Location: [facility];  Service: Vascular;  Laterality: Right;  . PATCH ANGIOPLASTY Right 08/26/2019   Procedure: PATCH ANGIOPLASTY OF RIGHT COMMON CAROTID ARTERY USING HEMASHIELD PLATINUM FINESSE PATCH;  Surgeon: Rosetta Posner, MD;  Location: [facility];  Service: Vascular;  Laterality: Right;  . RIGHT COLECTOMY  2005   for villous adenoma of the cecum Dr.streck  . VESICOVAGINAL FISTULA CLOSURE W/ TAH  1990  w/ cystocele &retocele repairs Dr. Ree Edman   Social History:  reports that she has never smoked. She has never used smokeless tobacco. She reports that she does not drink alcohol and does not use drugs.  Family / Support Systems Children: son Barrister's clerk) Other Supports: grand daughter  (nicki) Anticipated Caregiver: shawn and nicki, patty Ability/Limitations of Caregiver: Son works Surveyor, [employer]. Patty able to provide supervision Caregiver Availability: 24/7  Social History Preferred language: English Religion: Protestant Read: Yes Write: Yes   Abuse/Neglect Abuse/Neglect Assessment Can Be Completed: Yes Physical Abuse: Denies Verbal Abuse: Denies Sexual Abuse: Denies Exploitation of patient/patient's resources: Denies Self-Neglect: Denies  Emotional Status Recent Psychosocial Issues: no Psychiatric History: no Substance Abuse History: no  Patient / Family Perceptions, Expectations & Goals Pt/Family understanding of illness & functional limitations: yes Pt/family expectations/goals: Goal to discharhe home with son and grand daughter  Recruitment consultant: None Premorbid Home Care/DME Agencies: None Transportation available at discharge: family able to transport  Discharge Planning Living Arrangements: Children, Other relatives Support Systems: Children, Other relatives Type of Residence: Private residence (1 level home, no steps to enter) Administrator, sports: Multimedia programmer (specify) Nurse, mental health Medicare) Does the patient have any problems obtaining your medications?: No Care Coordinator Anticipated Follow Up Needs: HH/OP  Clinical Impression Sw called son, introduced self, explained role and process. Patient sleeping. Nurse at bedside attempting to take patients sugar level. SW will continue to follow up with questions and concerns.   Dyanne Iha 09/01/2019, 1:42 PM

## 2019-09-02 ENCOUNTER — Inpatient Hospital Stay (HOSPITAL_COMMUNITY): Payer: Medicare Other | Admitting: Physical Therapy

## 2019-09-02 ENCOUNTER — Inpatient Hospital Stay (HOSPITAL_COMMUNITY): Payer: Medicare Other | Admitting: Occupational Therapy

## 2019-09-02 ENCOUNTER — Inpatient Hospital Stay (HOSPITAL_COMMUNITY): Payer: Medicare Other | Admitting: Speech Pathology

## 2019-09-02 DIAGNOSIS — R030 Elevated blood-pressure reading, without diagnosis of hypertension: Secondary | ICD-10-CM

## 2019-09-02 DIAGNOSIS — R7309 Other abnormal glucose: Secondary | ICD-10-CM

## 2019-09-02 DIAGNOSIS — E1165 Type 2 diabetes mellitus with hyperglycemia: Secondary | ICD-10-CM

## 2019-09-02 DIAGNOSIS — E876 Hypokalemia: Secondary | ICD-10-CM

## 2019-09-02 DIAGNOSIS — I951 Orthostatic hypotension: Secondary | ICD-10-CM

## 2019-09-02 LAB — GLUCOSE, CAPILLARY
Glucose-Capillary: 85 mg/dL (ref 70–99)
Glucose-Capillary: 215 mg/dL — ABNORMAL HIGH (ref 70–99)
Glucose-Capillary: 195 mg/dL — ABNORMAL HIGH (ref 70–99)

## 2019-09-02 MED ORDER — MIDODRINE HCL 5 MG PO TABS
10.0000 mg | ORAL_TABLET | Freq: Two times a day (BID) | ORAL | Status: DC
  Administered 2019-09-02 – 2019-09-07 (×8): 10 mg via ORAL
  Filled 2019-09-02 (×8): qty 2

## 2019-09-02 NOTE — Progress Notes (Signed)
Occupational Therapy Session Note  Patient Details  Name: Jaclyn [name] MRN: [id number] Date of Birth: [birth year]  Today's Date: 09/02/2019 OT Individual Time: 4492-0100 OT Individual Time Calculation (min): 40 min    Short Term Goals: Week 1:  OT Short Term Goal 1 (Week 1): STG = LTG due to anticipated discharge/LOS  Skilled Therapeutic Interventions/Progress Updates:    "I dont feel like washing up, now don't push it".  Pt supine in bed, declining bathing or dressing, however agreeable to toileting.  Pt completed supine to sit with mod I.  CGA for in room mobility using RW to toilet.  Pt completed toilet transfer using grab bars with CGA.  Sit to stand at [facility] and 1 LOB noted coming out of bathroom with min assist to recover. Pt stood sinkside to brush teeth, comb hair, and wash hands, all with CGA.  Pt returned to EOB with CGA and sit to supine with supervision.  Call bell in reach, bed alarm on.  Therapy Documentation Precautions:  Precautions Precautions: Fall Restrictions Weight Bearing Restrictions: No   Therapy/Group: Individual Therapy  Ezekiel Slocumb 09/02/2019, 5:40 PM

## 2019-09-02 NOTE — Progress Notes (Signed)
Speech Language Pathology Daily Session Note  Patient Details  Name: Jaclyn [name] MRN: [id number] Date of Birth: 09-01-1935  Today's Date: 09/02/2019 SLP Individual Time: 1000-1058 SLP Individual Time Calculation (min): 58 min  Short Term Goals: Week 1: SLP Short Term Goal 1 (Week 1): STG=LTG due to estimated short length of stay  Skilled Therapeutic Interventions: Pt was seen for skilled ST targeting cognition. SLP facilitated session with introduction of memory notebook as compensatory strategy to assist with carryover of day to day information and new techniques learned in therapies while in CIR.  SLP further facilitated session with a basic money management task using cash and coins. She displayed set amounts of money and calculated change and totals with Min A for use a problem solving strategy and use of writing as aid for recall/working memory within task. During a modified basic medication management task (adjusted due to pt's low vision), pt demonstrated ability to organize pills on a basic QID pill chart with Supervision A verbal cues for problem solving. Pt left sitting edge of bed with alarm set and needs within reach. Continue per current plan of care.          Pain Pain Assessment Pain Scale: 0-10 Pain Score: 0-No pain  Therapy/Group: Individual Therapy  Arbutus Leas 09/02/2019, 7:29 AM

## 2019-09-02 NOTE — Progress Notes (Signed)
[place] PHYSICAL MEDICINE & REHABILITATION PROGRESS NOTE   Subjective/Complaints: Patient seen laying in bed this morning.  She states she slept well overnight.  She denies complaints.  She wants to know when she can go home.  ROS: Denies CP, SOB, N/V/D  Objective:   No results found. Recent Labs    08/31/19 0352 09/01/19 0641  WBC 6.4 7.3  HGB 9.1* 9.5*  HCT 28.3* 29.4*  PLT 207 245   Recent Labs    08/31/19 0352 09/01/19 0641  NA 138 139  K 3.2* 3.3*  CL 106 104  CO2 21* 24  GLUCOSE 173* 77  BUN 5* 7*  CREATININE 0.61 0.77  CALCIUM 8.1* 8.5*    Intake/Output Summary (Last 24 hours) at 09/02/2019 1107 Last data filed at 09/01/2019 1846 Gross per 24 hour  Intake 420 ml  Output --  Net 420 ml     Physical Exam: Vital Signs Blood pressure (!) 156/78, pulse 78, temperature 98 F (36.7 C), resp. rate 17, height 5\' 2"  (1.575 m), weight 71.5 kg, SpO2 94 %. Constitutional: No distress . Vital signs reviewed. HENT: Normocephalic.  Atraumatic. Eyes: EOMI. No discharge. Cardiovascular: No JVD.  RRR. Respiratory: Normal effort.  No stridor.  Bilaterally clear to auscultation. GI: Non-distended.  BS +. Skin: Warm and dry.  Intact. Psych: Normal mood.  Normal behavior. Musc: No edema in extremities.  No tenderness in extremities. Neuro: Alert Extremely HOH Motor: 5/5 throughout  Assessment/Plan: 1. Functional deficits secondary to RIght MCA infarct  which require 3+ hours per day of interdisciplinary therapy in a comprehensive inpatient rehab setting.  Physiatrist is providing close team supervision and 24 hour management of active medical problems listed below.  Physiatrist and rehab team continue to assess barriers to discharge/monitor patient progress toward functional and medical goals  Care Tool:  Bathing              Bathing assist       Upper Body Dressing/Undressing Upper body dressing   What is the patient wearing?: Hospital gown only     Upper body assist Assist Level: Minimal Assistance - Patient > 75%    Lower Body Dressing/Undressing Lower body dressing      What is the patient wearing?: Incontinence brief     Lower body assist Assist for lower body dressing: Moderate Assistance - Patient 50 - 74%     Toileting Toileting    Toileting assist Assist for toileting: Moderate Assistance - Patient 50 - 74%     Transfers Chair/bed transfer  Transfers assist     Chair/bed transfer assist level: Moderate Assistance - Patient 50 - 74%     Locomotion Ambulation   Ambulation assist      Assist level: Minimal Assistance - Patient > 75% Assistive device: Walker-rolling Max distance: 150   Walk 10 feet activity   Assist     Assist level: Minimal Assistance - Patient > 75% Assistive device: Walker-rolling   Walk 50 feet activity   Assist    Assist level: Minimal Assistance - Patient > 75% Assistive device: Walker-rolling    Walk 150 feet activity   Assist    Assist level: Minimal Assistance - Patient > 75% Assistive device: Walker-rolling    Walk 10 feet on uneven surface  activity   Assist Walk 10 feet on uneven surfaces activity did not occur: Safety/medical concerns         Wheelchair     Assist   Type of Wheelchair: Manual  Wheelchair assist level: Minimal Assistance - Patient > 75% Max wheelchair distance: 150    Wheelchair 50 feet with 2 turns activity    Assist        Assist Level: Minimal Assistance - Patient > 75%   Wheelchair 150 feet activity     Assist      Assist Level: Minimal Assistance - Patient > 75%   Blood pressure (!) 156/78, pulse 78, temperature 98 F (36.7 C), resp. rate 17, height 5\' 2"  (1.575 m), weight 71.5 kg, SpO2 94 %.  Medical Problem List and Plan: 1.  Left side weakness secondary to right MCA small subcortical infarct in the setting of severe intracranial large vessel disease with high-grade right ICA stenosis.   Status post right CEA  Continue CIR 2.  Antithrombotics: -DVT/anticoagulation: Eliquis             -antiplatelet therapy: Aspirin 81 mg daily 3. Pain Management: Oxycodone as needed 4. Mood: Cymbalta 60 mg daily             -antipsychotic agents: N/A 5. Neuropsych: This patient is ?fully capable of making decisions on her own behalf. 6. Skin/Wound Care: Routine skin checks 7. Fluids/Electrolytes/Nutrition: Routine in and outs.  8.  Orthostasis after CEA.    ProAmatine 2.5 mg 3 times daily, decreased to BID on 7/16 with breakfast and lunch  Orthostatic positive on 7/16             Monitor with increased activity 9.    Uncontrolled diabetes mellitus with hyperglycemia.  Hemoglobin A1c 14.2.  NovoLog 6 units 3 times daily, Levemir 40 units twice daily, Tradjenta 5 mg daily.  Provide diabetic teaching          CBG (last 3)  Recent Labs    09/01/19 1731 09/01/19 2103 09/02/19 0629  GLUCAP 171* 158* 85   Add bedtime snack to avoid am hypoglycemia  Labile, but?  Improving on 7/16 10.  Hypothyroidism. Continue Synthroid  11.  Hyperlipidemia.  Lipitor 12.  Recurrent UTIs. Tripmex 100 mg daily  13.  Hypokalemia   Potassium 3.3 on 7/15, labs ordered for Monday  Supplementing with 89meq daily  14.  Hypertension   See #8 LOS: 2 days A FACE TO FACE EVALUATION WAS PERFORMED  Tenna Lacko Lorie Phenix 09/02/2019, 11:07 AM

## 2019-09-02 NOTE — IPOC Note (Signed)
Individualized overall Plan of Care [facility]) Patient Details Name: Jaclyn [name] MRN: [id number] DOB: 12-May-1935  Admitting Diagnosis: Right middle cerebral artery stroke [facility])  Hospital Problems: Principal Problem:   Right middle cerebral artery stroke ([place]) Active Problems:   Hypokalemia   Labile blood glucose   Uncontrolled type 2 diabetes mellitus with hyperglycemia (HCC)   Orthostatic hypotension   Elevated blood pressure reading     Functional Problem List: Nursing Endurance, Medication Management, Safety  PT Balance, Behavior, Endurance, Motor, Perception, Safety, Sensory  OT Balance, Cognition, Endurance, Motor  SLP Cognition  TR         Basic ADL's: OT Grooming, Bathing, Dressing, Toileting, Eating     Advanced  ADL's: OT Simple Meal Preparation     Transfers: PT Bed Mobility, Bed to Chair, Car, Floor, Manufacturing systems engineer, Metallurgist: PT Ambulation, Emergency planning/management officer, Stairs     Additional Impairments: OT Fuctional Use of Upper Extremity  SLP Social Cognition   Problem Solving, Memory, Attention, Awareness  TR      Anticipated Outcomes Item Anticipated Outcome  Self Feeding mod I  Swallowing      Basic self-care  CS  Toileting  CS   Bathroom Transfers CS  Bowel/Bladder  Patient will remain continent  Transfers  Mod I with LRAD  Locomotion  Supervision assist wth ambulation using LRAD  Communication     Cognition  Supervision-Min A  Pain  Patients pain will be maintained within a reasonable level  Safety/Judgment  Patient will have no fall with injury while on CIR   Therapy Plan: PT Intensity: Minimum of 1-2 x/day ,45 to 90 minutes PT Frequency: 5 out of 7 days PT Duration Estimated Length of Stay: 7-10 days OT Intensity: Minimum of 1-2 x/day, 45 to 90 minutes OT Frequency: 5 out of 7 days OT Duration/Estimated Length of Stay: 7-10 days SLP Intensity: Minumum of 1-2 x/day, 30 to 90 minutes SLP Frequency: 3 to 5  out of 7 days SLP Duration/Estimated Length of Stay: 7-10 days    Team Interventions: Nursing Interventions Patient/Family Education, Disease Management/Prevention, Medication Management, Discharge Planning  PT interventions Ambulation/gait training, Community reintegration, DME/adaptive equipment instruction, Neuromuscular re-education, Psychosocial support, IT trainer, Wheelchair propulsion/positioning, UE/LE Strength taining/ROM, Training and development officer, Functional electrical stimulation, Discharge planning, Pain management, Skin care/wound management, Therapeutic Activities, UE/LE Coordination activities, Cognitive remediation/compensation, Disease management/prevention, Functional mobility training, Patient/family education, Splinting/orthotics, Therapeutic Exercise, Visual/perceptual remediation/compensation  OT Interventions Balance/vestibular training, Cognitive remediation/compensation, Discharge planning, DME/adaptive equipment instruction, Functional mobility training, Neuromuscular re-education, Patient/family education, Self Care/advanced ADL retraining, Therapeutic Activities, UE/LE Coordination activities, Visual/perceptual remediation/compensation  SLP Interventions Cognitive remediation/compensation, Cueing hierarchy, Functional tasks, Patient/family education, Therapeutic Activities, Internal/external aids  TR Interventions    SW/CM Interventions Discharge Planning, Psychosocial Support, Patient/Family Education   Barriers to Discharge MD  Medical stability and HOH  Nursing      PT Decreased caregiver support, Home environment Child psychotherapist, Insurance underwriter for SNF coverage    OT      SLP      SW       Team Discharge Planning: Destination: PT-Home ,OT- Home , SLP-Home Projected Follow-up: PT-Home health PT, OT-  Outpatient OT, SLP-Home Health SLP, 24 hour supervision/assistance Projected Equipment Needs: PT-To be determined, OT- Tub/shower bench, SLP-None recommended by  SLP Equipment Details: PT- , OT-  Patient/family involved in discharge planning: PT- Patient,  OT-Patient, SLP-Patient  MD ELOS: 7-10 days. Medical Rehab Prognosis:  Good Assessment:  84 year old right-handed female with history  of atrial fibrillation maintained on Eliquis, bilateral ICA stenosis, recurrent UTIs, fibromyalgia, diabetes mellitus, hyperlipidemia, multiple TIAs without residual weakness. She presented on 08/21/19 with left hemiparesis and AMS. She was found down by her son.  Cranial CT scan unremarkable for acute intracranial process.  Patient did not receive TPA.  MRI of the brain showed a 6 mm acute ischemic nonhemorrhagic periventricular white matter infarction involving the posterior right frontal corona radiata centrum semiovale.  CT angiogram of head and neck negative for large vessel occlusion.  Echocardiogram with EF of 70%. No wall motion abnormalities.  EEG x2 negative.  Admission chemistry sodium 132, ammonia level 26, glucose 360, hemoglobin 11.4 urinalysis negative nitrite hemoglobin A1c of 14.2.  Hospital course further complicated by right carotid stenosis and patient underwent right carotid enterectomy with Dacron patch angioplasty on 08/26/19 per Dr. Donnetta Hutching complicated by postoperative hypotension requiring dopamine for 24 hours.  Neurology follow-up and patient was cleared to resume her Eliquis as prior to admission as well as the addition of low-dose aspirin.  Tolerating a regular diet.    Patient with resulting functional deficits with endurance, balance, cognition.  We will set goals for supervision with PT/OT/SLP.   Due to the current state of emergency, patients may not be receiving their 3-hours of Medicare-mandated therapy.  See Team Conference Notes for weekly updates to the plan of care

## 2019-09-02 NOTE — Progress Notes (Signed)
Physical Therapy Session Note  Patient Details  Name: Jaclyn [name] MRN: [id number] Date of Birth: 1935-06-30  Today's Date: 09/02/2019 PT Individual Time: 9326-7124 PT Individual Time Calculation (min): 61 min   Short Term Goals: Week 1:  PT Short Term Goal 1 (Week 1): STG=LTG due to ELOS  Skilled Therapeutic Interventions/Progress Updates:    pt received sitting at EOB with nursing present and agreeable to therapy. Pt directed in ~10 minutes of dynamic sitting balance at EOB for upper and lower body dressing at CGA with extra time needed to explain task and PT to patient 2/2 to her hard of hearing. Pt directed in STS from bed and transfer to [facility] at min A for stability with VC for technique. Pt directed in 3x20 dynamic standing activity on airex pad at initially min A on first rep for stability however improved to CGA for duel task balance training with card match/sorting with reaching from various planes to complete task, VC for increased BOS and trunk extension. Pt directed in 2x5 STS from [facility] to Rolling walker initially min A improved to CGA with VC for hand placement. Pt directed in 2x47mins of BUE reaching in various directions at [place] board to encourage trunk rotation, weight shifting, and directional changes to challenge balance and promote improved standing balance and safety, pt benefited from encouragement to use LUE, CGA with intiermittent Rolling walker use. Pt directed in gait training with rolling walker at min A with intermittent CGA for 125' with VC for trunk extension, inc stride length however pt limited 2/2 being very distracted with other in hall or stopping to read signs multiple times, min A on two occasions for slight LOB with this, difficult to redirect with VC and ultimately required sitting rest break at end of gait training in open environment. Pt returned to room directed in STS from [facility] and transferred to bed at min A, CGA for sit>supine.  Pt left in bed, All needs in  reach and in good condition. Call light in hand.  3 rails up.   Therapy Documentation Precautions:  Precautions Precautions: Fall Restrictions Weight Bearing Restrictions: No General:   Vital Signs: Therapy Vitals Temp: 97.6 F (36.4 C) Pulse Rate: 69 Resp: 18 BP: (!) 171/67 Patient Position (if appropriate): Lying Oxygen Therapy SpO2: 98 % O2 Device: Room Air Pain: Pain Assessment Pain Scale: 0-10 Pain Score: 0-No pain        Therapy/Group: Individual Therapy  Junie Panning 09/02/2019, 4:06 PM

## 2019-09-03 ENCOUNTER — Inpatient Hospital Stay (HOSPITAL_COMMUNITY): Payer: Medicare Other

## 2019-09-03 ENCOUNTER — Inpatient Hospital Stay (HOSPITAL_COMMUNITY): Payer: Medicare Other | Admitting: Occupational Therapy

## 2019-09-03 LAB — GLUCOSE, CAPILLARY
Glucose-Capillary: 98 mg/dL (ref 70–99)
Glucose-Capillary: 104 mg/dL — ABNORMAL HIGH (ref 70–99)
Glucose-Capillary: 193 mg/dL — ABNORMAL HIGH (ref 70–99)
Glucose-Capillary: 137 mg/dL — ABNORMAL HIGH (ref 70–99)

## 2019-09-03 NOTE — Progress Notes (Signed)
Occupational Therapy Session Note  Patient Details  Name: Jaclyn [name] MRN: [id number] Date of Birth: Oct 06, 1935  Today's Date: 09/03/2019 OT Individual Time: 1510-1547 OT Individual Time Calculation (min): 37 min    Short Term Goals: Week 1:  OT Short Term Goal 1 (Week 1): STG = LTG due to anticipated discharge/LOS  Skilled Therapeutic Interventions/Progress Updates:    Pt agreeable to therapy and just finishing PT when therapist entered the room.  She agreed to transition down to the therapy gym for work on coordination and balance.  Therapist took her down to the gym and she completed transfer with use of the RW and min assist.  Had her begin in sitting with work on dowel rod exercises.  She was able to complete 1 set of 10 with 1 lb dowel rod for shoulder flexion.  She then completed 2 more sets of 10 reps with 3 lb dowel rod.  Had her work on shoulder strengthening via volleyball with pt using the dowel rod to hit a beach ball back to therapist for approximately 2 sets of 20 repetitions.  Next, had her pick up and place resistive clothespins using the LUE.  She was able to complete all levels with increased time, incorporating picking them up while standing and placing them with min guard assist.  Returned to the room at the end of the session via wheelchair with pt transferring back to the bed using the RW and min assist.  She was left with the call button in reach and bed alarm in place.    Therapy Documentation Precautions:  Precautions Precautions: Fall Precaution Comments: left inattention (improving) Restrictions Weight Bearing Restrictions: No  Pain: Pain Assessment Pain Scale: Faces Pain Score: 0-No pain ADL: See Care Tool Section for some details of mobility and selfcare  Therapy/Group: Individual Therapy  Season Astacio OTR/L 09/03/2019, 4:27 PM

## 2019-09-03 NOTE — Progress Notes (Signed)
Occupational Therapy Note  Patient Details  Name: Jaclyn [name] MRN: [id number] Date of Birth: 1935-05-02  Pt missed 60 mins OT secondary to refusal stating she didn't do anything or therapy early in the morning.  Pt laying awake in bed just after 9:00 am when therapist entered.  Therapist tried to encourage pt to participate, however she adamantly declined.  Will re-attempt later today.     Nadelyn Enriques OTR/L 09/03/2019, 12:13 PM

## 2019-09-03 NOTE — Progress Notes (Signed)
Physical Therapy Session Note  Patient Details  Name: Jaclyn [name] MRN: [id number] Date of Birth: [birth year]  Today's Date: 09/03/2019 PT Individual Time: 8101-7510 PT Individual Time Calculation (min): 54 min   Short Term Goals: Week 1:  PT Short Term Goal 1 (Week 1): STG=LTG due to ELOS  Skilled Therapeutic Interventions/Progress Updates:  Pt asleep but easily awakened to touch.  Pt verbalized feeling depressed in last year.  PT assured pt that she was taking Cymbalta.  Pt verbose about hx of depression and fibromyalgia.  PT provided emotional support.  She used voice amplifier during session. She denied pain.  Pt stated that she was incontinent of urine; she was accurate , including wet gown and bedding.  Pt rolled with supervision, bridged and used wet washcloths to assist with doffing wet things and donning dry brief and gown.  neuromuscular re-education via multimodal cues and demo for supine- bil bridging and lower trunk trunk rotation both with adductor squeezes to activate core muscles.  Standing with bil UE support- bil heel/toe raises, mini squats.    Transfer training for eccentric control stand> sit and anterior wt shift for sit> stand using Bobath method, with improvements with practice.  Gait training with RW over level tile,  x 50' with CGA, multiple turns. No LOB.  HOH status complicates VCs to pt, even with amplifier use.   At end of session, pt in w/c, starting with  Jaclyn [name], OT       Therapy Documentation Precautions:  Precautions Precautions: Fall Precaution Comments: left inattention (improving) Restrictions Weight Bearing Restrictions: No       Therapy/Group: Individual Therapy  Jaclyn [name] 09/03/2019, 4:29 PM

## 2019-09-03 NOTE — Progress Notes (Signed)
[place] PHYSICAL MEDICINE & REHABILITATION PROGRESS NOTE   Subjective/Complaints: No new complaints. Still quite anxious to get home.   ROS: Patient denies fever, rash, sore throat, blurred vision, nausea, vomiting, diarrhea, cough, shortness of breath or chest pain, joint or back pain, headache, or mood change.    Objective:   No results found. Recent Labs    09/01/19 0641  WBC 7.3  HGB 9.5*  HCT 29.4*  PLT 245   Recent Labs    09/01/19 0641  NA 139  K 3.3*  CL 104  CO2 24  GLUCOSE 77  BUN 7*  CREATININE 0.77  CALCIUM 8.5*    Intake/Output Summary (Last 24 hours) at 09/03/2019 1028 Last data filed at 09/03/2019 0830 Gross per 24 hour  Intake 800 ml  Output --  Net 800 ml     Physical Exam: Vital Signs Blood pressure (!) 141/77, pulse 76, temperature 97.8 F (36.6 C), resp. rate 16, height 5\' 2"  (1.575 m), weight 71.5 kg, SpO2 95 %. Constitutional: No distress . Vital signs reviewed. HEENT: EOMI, oral membranes moist Neck: supple Cardiovascular: RRR without murmur. No JVD    Respiratory/Chest: CTA Bilaterally without wheezes or rales. Normal effort    GI/Abdomen: BS +, non-tender, non-distended Ext: no clubbing, cyanosis, or edema Psych: pleasant and cooperative Skin: Warm and dry.  Intact. Musc: No edema in extremities.  No tenderness in extremities. Neuro: Alert Extremely HOH Motor: 5/5 throughout  Assessment/Plan: 1. Functional deficits secondary to RIght MCA infarct  which require 3+ hours per day of interdisciplinary therapy in a comprehensive inpatient rehab setting.  Physiatrist is providing close team supervision and 24 hour management of active medical problems listed below.  Physiatrist and rehab team continue to assess barriers to discharge/monitor patient progress toward functional and medical goals  Care Tool:  Bathing  Bathing activity did not occur: Refused           Bathing assist       Upper Body  Dressing/Undressing Upper body dressing   What is the patient wearing?: Hospital gown only    Upper body assist Assist Level: Minimal Assistance - Patient > 75%    Lower Body Dressing/Undressing Lower body dressing      What is the patient wearing?: Incontinence brief     Lower body assist Assist for lower body dressing: Moderate Assistance - Patient 50 - 74%     Toileting Toileting Toileting Activity did not occur (Clothing management and hygiene only): N/A (no void or bm)  Toileting assist Assist for toileting: Minimal Assistance - Patient > 75%     Transfers Chair/bed transfer  Transfers assist     Chair/bed transfer assist level: Moderate Assistance - Patient 50 - 74%     Locomotion Ambulation   Ambulation assist      Assist level: Minimal Assistance - Patient > 75% Assistive device: Walker-rolling Max distance: 150   Walk 10 feet activity   Assist     Assist level: Minimal Assistance - Patient > 75% Assistive device: Walker-rolling   Walk 50 feet activity   Assist    Assist level: Minimal Assistance - Patient > 75% Assistive device: Walker-rolling    Walk 150 feet activity   Assist    Assist level: Minimal Assistance - Patient > 75% Assistive device: Walker-rolling    Walk 10 feet on uneven surface  activity   Assist Walk 10 feet on uneven surfaces activity did not occur: Safety/medical concerns  Wheelchair     Assist   Type of Wheelchair: Manual    Wheelchair assist level: Minimal Assistance - Patient > 75% Max wheelchair distance: 150    Wheelchair 50 feet with 2 turns activity    Assist        Assist Level: Minimal Assistance - Patient > 75%   Wheelchair 150 feet activity     Assist      Assist Level: Minimal Assistance - Patient > 75%   Blood pressure (!) 141/77, pulse 76, temperature 97.8 F (36.6 C), resp. rate 16, height 5\' 2"  (1.575 m), weight 71.5 kg, SpO2 95 %.  Medical Problem  List and Plan: 1.  Left side weakness secondary to right MCA small subcortical infarct in the setting of severe intracranial large vessel disease with high-grade right ICA stenosis.  Status post right CEA  Continue CIR 2.  Antithrombotics: -DVT/anticoagulation: Eliquis             -antiplatelet therapy: Aspirin 81 mg daily 3. Pain Management: Oxycodone as needed 4. Mood: Cymbalta 60 mg daily             -antipsychotic agents: N/A 5. Neuropsych: This patient is ?fully capable of making decisions on her own behalf. 6. Skin/Wound Care: Routine skin checks 7. Fluids/Electrolytes/Nutrition: Routine in and outs.  8.  Orthostasis after CEA.    ProAmatine 2.5 mg 3 times daily, decreased to BID on 7/16 with breakfast and lunch  Orthostatic positive on 7/16, OVS today              TEDs, binder, acclimation 9.    Uncontrolled diabetes mellitus with hyperglycemia.  Hemoglobin A1c 14.2.  NovoLog 6 units 3 times daily, Levemir 40 units twice daily, Tradjenta 5 mg daily.  Provide diabetic teaching          CBG (last 3)  Recent Labs    09/02/19 1126 09/02/19 2139 09/03/19 0601  GLUCAP 215* 195* 98   Add bedtime snack to avoid am hypoglycemia  Improving until a bump during lunch/afternoon yesterday 7/16 10.  Hypothyroidism. Continue Synthroid  11.  Hyperlipidemia.  Lipitor 12.  Recurrent UTIs. Tripmex 100 mg daily  13.  Hypokalemia   Potassium 3.3 on 7/15, labs ordered for Monday  Supplementing with 58meq daily  14.  Hypertension   See #8 LOS: 3 days A FACE TO FACE EVALUATION WAS PERFORMED  Meredith Staggers 09/03/2019, 10:28 AM

## 2019-09-03 NOTE — Progress Notes (Signed)
A&Ox4. Refused AM therapy session. No c/o pain or discomfort. Tolerating diet well. Participated in afternoon therapy sessions. No glycemic episodes this shift. No acute events this shift.

## 2019-09-03 NOTE — Progress Notes (Signed)
Occupational Therapy Session Note  Patient Details  Name: Jaclyn [name] MRN: [id number] Date of Birth: 1935-04-14  Today's Date: 09/03/2019 OT Individual Time: 1045-1110 OT Individual Time Calculation (min): 25 min    Short Term Goals: Week 1:  OT Short Term Goal 1 (Week 1): STG = LTG due to anticipated discharge/LOS  Skilled Therapeutic Interventions/Progress Updates:    Pt resting in bed upon arrival.  Pt stated she wasn't a "morning person" and did not want to wash up or change into clothing. OT intervention with focus on bed mobility, sit<>stand, standing balance, and safety awareness to increase independence with BADLs.  Pt declined walking over to sink for grooming tasks.  Pt commented that her LUE "wasn't working right" although gross assessment WNL. Bed mobility with supervision.  Sit<>stand and standing balance with CGA. Pt commented on several occasions that she thought she might be depressed and frequently referenced the death of her 3 sisters earlier in the year. Pt remained in bed with all needs within reach and bed alarm activated.   Therapy Documentation Precautions:  Precautions Precautions: Fall Restrictions Weight Bearing Restrictions: No   Pain:  Pt denies pain this morning   Therapy/Group: Individual Therapy  Leroy Libman 09/03/2019, 11:19 AM

## 2019-09-04 LAB — GLUCOSE, CAPILLARY
Glucose-Capillary: 79 mg/dL (ref 70–99)
Glucose-Capillary: 66 mg/dL — ABNORMAL LOW (ref 70–99)
Glucose-Capillary: 91 mg/dL (ref 70–99)
Glucose-Capillary: 193 mg/dL — ABNORMAL HIGH (ref 70–99)
Glucose-Capillary: 233 mg/dL — ABNORMAL HIGH (ref 70–99)
Glucose-Capillary: 283 mg/dL — ABNORMAL HIGH (ref 70–99)

## 2019-09-04 MED ORDER — INSULIN DETEMIR 100 UNIT/ML ~~LOC~~ SOLN
40.0000 [IU] | Freq: Every day | SUBCUTANEOUS | Status: DC
  Administered 2019-09-05 – 2019-09-09 (×5): 40 [IU] via SUBCUTANEOUS
  Filled 2019-09-04 (×5): qty 0.4

## 2019-09-04 MED ORDER — INSULIN DETEMIR 100 UNIT/ML ~~LOC~~ SOLN
35.0000 [IU] | Freq: Every day | SUBCUTANEOUS | Status: DC
  Administered 2019-09-04: 35 [IU] via SUBCUTANEOUS
  Filled 2019-09-04 (×2): qty 0.35

## 2019-09-04 NOTE — Progress Notes (Signed)
[facility] PHYSICAL MEDICINE & REHABILITATION PROGRESS NOTE   Subjective/Complaints: Up in bed. Just finished breakfast. No new complaints. Sugar low this morning  ROS: Patient denies fever, rash, sore throat, blurred vision, nausea, vomiting, diarrhea, cough, shortness of breath or chest pain, joint or back pain, headache, or mood change.    Objective:   No results found. No results for input(s): WBC, HGB, HCT, PLT in the last 72 hours. No results for input(s): NA, K, CL, CO2, GLUCOSE, BUN, CREATININE, CALCIUM in the last 72 hours.  Intake/Output Summary (Last 24 hours) at 09/04/2019 0911 Last data filed at 09/03/2019 1915 Gross per 24 hour  Intake 480 ml  Output --  Net 480 ml     Physical Exam: Vital Signs Blood pressure 134/62, pulse 76, temperature 97.8 F (36.6 C), resp. rate 18, height 5\' 2"  (1.575 m), weight 71.5 kg, SpO2 97 %. Constitutional: No distress . Vital signs reviewed. HEENT: EOMI, oral membranes moist Neck: supple Cardiovascular: RRR without murmur. No JVD    Respiratory/Chest: CTA Bilaterally without wheezes or rales. Normal effort    GI/Abdomen: BS +, non-tender, non-distended Ext: no clubbing, cyanosis, or edema Psych: pleasant and cooperative Skin: Warm and dry.  Intact. Musc: No edema in extremities.  No tenderness in extremities. Neuro: Alert Remains HOH Motor: 5/5 throughout, good sitting balance  Assessment/Plan: 1. Functional deficits secondary to RIght MCA infarct  which require 3+ hours per day of interdisciplinary therapy in a comprehensive inpatient rehab setting.  Physiatrist is providing close team supervision and 24 hour management of active medical problems listed below.  Physiatrist and rehab team continue to assess barriers to discharge/monitor patient progress toward functional and medical goals  Care Tool:  Bathing  Bathing activity did not occur: Refused           Bathing assist       Upper Body  Dressing/Undressing Upper body dressing   What is the patient wearing?: Pull over shirt    Upper body assist Assist Level: Minimal Assistance - Patient > 75%    Lower Body Dressing/Undressing Lower body dressing      What is the patient wearing?: Incontinence brief, Pants     Lower body assist Assist for lower body dressing: Moderate Assistance - Patient 50 - 74%     Toileting Toileting Toileting Activity did not occur Landscape architect and hygiene only): N/A (no void or bm)  Toileting assist Assist for toileting: Minimal Assistance - Patient > 75%     Transfers Chair/bed transfer  Transfers assist     Chair/bed transfer assist level: Minimal Assistance - Patient > 75%     Locomotion Ambulation   Ambulation assist      Assist level: Minimal Assistance - Patient > 75% Assistive device: Walker-rolling Max distance: 50   Walk 10 feet activity   Assist     Assist level: Minimal Assistance - Patient > 75% Assistive device: Walker-rolling   Walk 50 feet activity   Assist    Assist level: Minimal Assistance - Patient > 75% Assistive device: Walker-rolling    Walk 150 feet activity   Assist    Assist level: Minimal Assistance - Patient > 75% Assistive device: Walker-rolling    Walk 10 feet on uneven surface  activity   Assist Walk 10 feet on uneven surfaces activity did not occur: Safety/medical concerns         Wheelchair     Assist   Type of Wheelchair: Manual    Wheelchair assist  level: Minimal Assistance - Patient > 75% Max wheelchair distance: 150    Wheelchair 50 feet with 2 turns activity    Assist        Assist Level: Minimal Assistance - Patient > 75%   Wheelchair 150 feet activity     Assist      Assist Level: Maximal Assistance - Patient 25 - 49%   Blood pressure 134/62, pulse 76, temperature 97.8 F (36.6 C), resp. rate 18, height 5\' 2"  (1.575 m), weight 71.5 kg, SpO2 97 %.  Medical Problem  List and Plan: 1.  Left side weakness secondary to right MCA small subcortical infarct in the setting of severe intracranial large vessel disease with high-grade right ICA stenosis.  Status post right CEA  Continue CIR 2.  Antithrombotics: -DVT/anticoagulation: Eliquis             -antiplatelet therapy: Aspirin 81 mg daily 3. Pain Management: Oxycodone as needed 4. Mood: Cymbalta 60 mg daily             -antipsychotic agents: N/A 5. Neuropsych: This patient is ?fully capable of making decisions on her own behalf. 6. Skin/Wound Care: Routine skin checks 7. Fluids/Electrolytes/Nutrition: Routine in and outs.  8.  Orthostasis after CEA.    ProAmatine 2.5 mg 3 times daily, decreased to BID on 7/16 with breakfast and lunch  Orthostatic positive on 7/16, none checked yesterday--"re-iterated" order today 7/18             TEDs, binder, acclimation 9.    Uncontrolled diabetes mellitus with hyperglycemia.  Hemoglobin A1c 14.2.  NovoLog 6 units 3 times daily, Levemir 40 units twice daily, Tradjenta 5 mg daily.  Provide diabetic teaching          CBG (last 3)  Recent Labs    09/04/19 0526 09/04/19 0611 09/04/19 0635  GLUCAP 79 66* 91   Add bedtime snack to avoid am hypoglycemia  7/18 continued low AM readings. Decrease PM levemir to 35u 10.  Hypothyroidism. Continue Synthroid  11.  Hyperlipidemia.  Lipitor 12.  Recurrent UTIs. Tripmex 100 mg daily  13.  Hypokalemia   Potassium 3.3 on 7/15, labs ordered for Monday  Supplementing with 74meq daily  14.  Hypertension   See #8 LOS: 4 days A FACE TO FACE EVALUATION WAS PERFORMED  Meredith Staggers 09/04/2019, 9:11 AM

## 2019-09-04 NOTE — Discharge Instructions (Addendum)
Inpatient Rehab Discharge Instructions  Jaclyn [name] Discharge date and time: No discharge date for patient encounter.   Activities/Precautions/ Functional Status: Activity: activity as tolerated Diet: diabetic diet Wound Care: keep wound clean and dry Functional status:  ___ No restrictions     ___ Walk up steps independently ___ 24/7 supervision/assistance   ___ Walk up steps with assistance ___ Intermittent supervision/assistance  ___ Bathe/dress independently ___ Walk with walker     _x__ Bathe/dress with assistance ___ Walk Independently    ___ Shower independently ___ Walk with assistance    ___ Shower with assistance ___ No alcohol     ___ Return to work/school ________ COMMUNITY REFERRALS UPON DISCHARGE:    Home Health:   PT    OT    ST                     Agency: [facility] Phone: [phone]    Medical Equipment/Items Ordered: No DME recommended                                                   Special Instructions: No driving smoking or alcohol STROKE/TIA DISCHARGE INSTRUCTIONS SMOKING Cigarette smoking nearly doubles your risk of having a stroke & is the single most alterable risk factor  If you smoke or have smoked in the last 12 months, you are advised to quit smoking for your health.  Most of the excess cardiovascular risk related to smoking disappears within a year of stopping.  Ask you doctor about anti-smoking medications  [facility] Quit Line: 1-800-QUIT NOW  Free Smoking Cessation Classes [phone]  CHOLESTEROL Know your levels; limit fat & cholesterol in your diet  Lipid Panel     Component Value Date/Time   CHOL 204 (H) 08/22/2019 0459   TRIG 338 (H) 08/22/2019 0459   HDL 33 (L) 08/22/2019 0459   CHOLHDL 6.2 08/22/2019 0459   VLDL 68 (H) 08/22/2019 0459   LDLCALC 103 (H) 08/22/2019 0459      Many patients benefit from treatment even if their cholesterol is at goal.  Goal: Total Cholesterol (CHOL) less than 160  Goal:  Triglycerides (TRIG)  less than 150  Goal:  HDL greater than 40  Goal:  LDL (LDLCALC) less than 100   BLOOD PRESSURE American Stroke Association blood pressure target is less that 120/80 mm/Hg  Your discharge blood pressure is:  BP: (!) 164/73  Monitor your blood pressure  Limit your salt and alcohol intake  Many individuals will require more than one medication for high blood pressure  DIABETES (A1c is a blood sugar average for last 3 months) Goal HGBA1c is under 7% (HBGA1c is blood sugar average for last 3 months)  Diabetes:    Lab Results  Component Value Date   HGBA1C 14.2 (H) 08/22/2019     Your HGBA1c can be lowered with medications, healthy diet, and exercise.  Check your blood sugar as directed by your physician  Call your physician if you experience unexplained or low blood sugars.  PHYSICAL ACTIVITY/REHABILITATION Goal is 30 minutes at least 4 days per week  Activity: Increase activity slowly, Therapies: Physical Therapy: Home Health Return to work:   Activity decreases your risk of heart attack and stroke and makes your heart stronger.  It helps control your weight and blood pressure; helps you relax and  can improve your mood.  Participate in a regular exercise program.  Talk with your doctor about the best form of exercise for you (dancing, walking, swimming, cycling).  DIET/WEIGHT Goal is to maintain a healthy weight  Your discharge diet is:  Diet Order            Diet Heart Room service appropriate? Yes with Assist; Fluid consistency: Thin  Diet effective now                 liquids Your height is:  Height: 5\' 2"  (157.5 cm) Your current weight is: Weight: 71.5 kg Your Body Mass Index (BMI) is:  BMI (Calculated): 28.82  Following the type of diet specifically designed for you will help prevent another stroke.  Your goal weight range is:    Your goal Body Mass Index (BMI) is 19-24.  Healthy food habits can help reduce 3 risk factors for stroke:  High cholesterol,  hypertension, and excess weight.  RESOURCES Stroke/Support Group:  Call [phone]   STROKE EDUCATION PROVIDED/REVIEWED AND GIVEN TO PATIENT Stroke warning signs and symptoms How to activate emergency medical system (call 911). Medications prescribed at discharge. Need for follow-up after discharge. Personal risk factors for stroke. Pneumonia vaccine given:  Flu vaccine given:  My questions have been answered, the writing is legible, and I understand these instructions.  I will adhere to these goals & educational materials that have been provided to me after my discharge from the hospital.      My questions have been answered and I understand these instructions. I will adhere to these goals and the provided educational materials after my discharge from the hospital.  Patient/Caregiver Signature _______________________________ Date __________  Clinician Signature _______________________________________ Date __________  Please bring this form and your medication list with you to all your follow-up doctor's appointments.   Information on my medicine - ELIQUIS (apixaban)  This medication education was reviewed with me or my healthcare representative as part of my discharge preparation.   Why was Eliquis prescribed for you? Eliquis was prescribed for you to reduce the risk of forming blood clots that can cause a stroke if you have a medical condition called atrial fibrillation (a type of irregular heartbeat) OR to reduce the risk of a blood clots forming after orthopedic surgery.  What do You need to know about Eliquis ? Take your Eliquis TWICE DAILY - one tablet in the morning and one tablet in the evening with or without food.  It would be best to take the doses about the same time each day.  If you have difficulty swallowing the tablet whole please discuss with your pharmacist how to take the medication safely.  Take Eliquis exactly as prescribed by your doctor and DO NOT  stop taking Eliquis without talking to the doctor who prescribed the medication.  Stopping may increase your risk of developing a new clot or stroke.  Refill your prescription before you run out.  After discharge, you should have regular check-up appointments with your healthcare provider that is prescribing your Eliquis.  In the future your dose may need to be changed if your kidney function or weight changes by a significant amount or as you get older.  What do you do if you miss a dose? If you miss a dose, take it as soon as you remember on the same day and resume taking twice daily.  Do not take more than one dose of ELIQUIS at the same time.  Important Safety Information  A possible side effect of Eliquis is bleeding. You should call your healthcare provider right away if you experience any of the following: ? Bleeding from an injury or your nose that does not stop. ? Unusual colored urine (red or dark brown) or unusual colored stools (red or black). ? Unusual bruising for unknown reasons. ? A serious fall or if you hit your head (even if there is no bleeding).  Some medicines may interact with Eliquis and might increase your risk of bleeding or clotting while on Eliquis. To help avoid this, consult your healthcare provider or pharmacist prior to using any new prescription or non-prescription medications, including herbals, vitamins, non-steroidal anti-inflammatory drugs (NSAIDs) and supplements.  This website has more information on Eliquis (apixaban): [email or web address].

## 2019-09-04 NOTE — Progress Notes (Addendum)
Hypoglycemic Event  CBG: 66  Treatment:120 mls of juice  Symptoms: mild diaphoresis  Follow-up CBG: Time: 0635 CBG Result 91  Possible Reasons for Event: not an adequate HS snack  Comments/MD notified: protocol followed    Consuello Masse

## 2019-09-05 ENCOUNTER — Inpatient Hospital Stay (HOSPITAL_COMMUNITY): Payer: Medicare Other

## 2019-09-05 LAB — CBC
HCT: 32.5 % — ABNORMAL LOW (ref 36.0–46.0)
Hemoglobin: 10.6 g/dL — ABNORMAL LOW (ref 12.0–15.0)
MCH: 29.7 pg (ref 26.0–34.0)
MCHC: 32.6 g/dL (ref 30.0–36.0)
MCV: 91 fL (ref 80.0–100.0)
Platelets: 290 10*3/uL (ref 150–400)
RBC: 3.57 MIL/uL — ABNORMAL LOW (ref 3.87–5.11)
RDW: 15.1 % (ref 11.5–15.5)
WBC: 5.7 10*3/uL (ref 4.0–10.5)
nRBC: 0 % (ref 0.0–0.2)

## 2019-09-05 LAB — GLUCOSE, CAPILLARY
Glucose-Capillary: 340 mg/dL — ABNORMAL HIGH (ref 70–99)
Glucose-Capillary: 277 mg/dL — ABNORMAL HIGH (ref 70–99)
Glucose-Capillary: 259 mg/dL — ABNORMAL HIGH (ref 70–99)
Glucose-Capillary: 267 mg/dL — ABNORMAL HIGH (ref 70–99)

## 2019-09-05 LAB — BASIC METABOLIC PANEL
Anion gap: 11 (ref 5–15)
BUN: 11 mg/dL (ref 8–23)
CO2: 24 mmol/L (ref 22–32)
Calcium: 9.2 mg/dL (ref 8.9–10.3)
Chloride: 102 mmol/L (ref 98–111)
Creatinine, Ser: 0.97 mg/dL (ref 0.44–1.00)
GFR calc Af Amer: >60 mL/min (ref >60)
GFR calc non Af Amer: 54 mL/min — ABNORMAL LOW (ref >60)
Glucose, Bld: 260 mg/dL — ABNORMAL HIGH (ref 70–99)
Potassium: 4.2 mmol/L (ref 3.5–5.1)
Sodium: 137 mmol/L (ref 135–145)

## 2019-09-05 MED ORDER — INSULIN DETEMIR 100 UNIT/ML ~~LOC~~ SOLN
38.0000 [IU] | Freq: Every day | SUBCUTANEOUS | Status: DC
  Administered 2019-09-05 – 2019-09-06 (×2): 38 [IU] via SUBCUTANEOUS
  Filled 2019-09-05 (×3): qty 0.38

## 2019-09-05 NOTE — Plan of Care (Signed)
  Problem: Consults Goal: RH STROKE PATIENT EDUCATION Description: See Patient Education module for education specifics  Outcome: Progressing Goal: Nutrition Consult-if indicated Outcome: Progressing Goal: Diabetes Guidelines if Diabetic/Glucose > 140 Description: If diabetic or lab glucose is > 140 mg/dl - Initiate Diabetes/Hyperglycemia Guidelines & Document Interventions  Outcome: Progressing   Problem: RH BOWEL ELIMINATION Goal: RH STG MANAGE BOWEL WITH ASSISTANCE Description: STG Manage Bowel with mod I Assistance. Outcome: Progressing

## 2019-09-05 NOTE — Progress Notes (Signed)
[place] PHYSICAL MEDICINE & REHABILITATION PROGRESS NOTE   Subjective/Complaints: CBG up this am, asking about BPs   ROS: Patient denies CP, SOB, N/V/D Objective:   No results found. Recent Labs    09/05/19 0530  WBC 5.7  HGB 10.6*  HCT 32.5*  PLT 290   No results for input(s): NA, K, CL, CO2, GLUCOSE, BUN, CREATININE, CALCIUM in the last 72 hours.  Intake/Output Summary (Last 24 hours) at 09/05/2019 0906 Last data filed at 09/05/2019 0846 Gross per 24 hour  Intake 817 ml  Output --  Net 817 ml     Physical Exam: Vital Signs Blood pressure (!) 148/77, pulse 91, temperature 98.5 F (36.9 C), temperature source Oral, resp. rate 18, height 5\' 2"  (1.575 m), weight 71.5 kg, SpO2 96 %.  General: No acute distress Mood and affect are appropriate Heart: Regular rate and rhythm no rubs murmurs or extra sounds Lungs: Clear to auscultation, breathing unlabored, no rales or wheezes Abdomen: Positive bowel sounds, soft nontender to palpation, nondistended Extremities: No clubbing, cyanosis, or edema Skin: No evidence of breakdown, no evidence of rash  Neuro: Alert Remains HOH Motor: 5/5 throughout, good sitting balance  Assessment/Plan: 1. Functional deficits secondary to RIght MCA infarct  which require 3+ hours per day of interdisciplinary therapy in a comprehensive inpatient rehab setting.  Physiatrist is providing close team supervision and 24 hour management of active medical problems listed below.  Physiatrist and rehab team continue to assess barriers to discharge/monitor patient progress toward functional and medical goals  Care Tool:  Bathing  Bathing activity did not occur: Refused           Bathing assist       Upper Body Dressing/Undressing Upper body dressing   What is the patient wearing?: Pull over shirt    Upper body assist Assist Level: Minimal Assistance - Patient > 75%    Lower Body Dressing/Undressing Lower body dressing      What is  the patient wearing?: Incontinence brief, Pants     Lower body assist Assist for lower body dressing: Moderate Assistance - Patient 50 - 74%     Toileting Toileting Toileting Activity did not occur Landscape architect and hygiene only): N/A (no void or bm)  Toileting assist Assist for toileting: Minimal Assistance - Patient > 75%     Transfers Chair/bed transfer  Transfers assist     Chair/bed transfer assist level: Minimal Assistance - Patient > 75%     Locomotion Ambulation   Ambulation assist      Assist level: Minimal Assistance - Patient > 75% Assistive device: Walker-rolling Max distance: 50   Walk 10 feet activity   Assist     Assist level: Minimal Assistance - Patient > 75% Assistive device: Walker-rolling   Walk 50 feet activity   Assist    Assist level: Minimal Assistance - Patient > 75% Assistive device: Walker-rolling    Walk 150 feet activity   Assist    Assist level: Minimal Assistance - Patient > 75% Assistive device: Walker-rolling    Walk 10 feet on uneven surface  activity   Assist Walk 10 feet on uneven surfaces activity did not occur: Safety/medical concerns         Wheelchair     Assist   Type of Wheelchair: Manual    Wheelchair assist level: Minimal Assistance - Patient > 75% Max wheelchair distance: 150    Wheelchair 50 feet with 2 turns activity    Assist  Assist Level: Minimal Assistance - Patient > 75%   Wheelchair 150 feet activity     Assist      Assist Level: Maximal Assistance - Patient 25 - 49%   Blood pressure (!) 148/77, pulse 91, temperature 98.5 F (36.9 C), temperature source Oral, resp. rate 18, height 5\' 2"  (1.575 m), weight 71.5 kg, SpO2 96 %.  Medical Problem List and Plan: 1.  Left side weakness secondary to right MCA small subcortical infarct in the setting of severe intracranial large vessel disease with high-grade right ICA stenosis.  Status post right  CEA  Continue CIR 2.  Antithrombotics: -DVT/anticoagulation: Eliquis             -antiplatelet therapy: Aspirin 81 mg daily 3. Pain Management: Oxycodone as needed 4. Mood: Cymbalta 60 mg daily             -antipsychotic agents: N/A 5. Neuropsych: This patient is ?fully capable of making decisions on her own behalf. 6. Skin/Wound Care: Routine skin checks 7. Fluids/Electrolytes/Nutrition: Routine in and outs.  8.  Orthostasis after CEA.    ProAmatine 2.5 mg 3 times daily, decreased to BID on 7/16 with breakfast and lunch  Orthostatic positive on 7/16,  Vitals:   09/05/19 0511 09/05/19 0513  BP: (!) 130/56 (!) 148/77  Pulse: (!) 101 91  Resp: 20 18  Temp:    SpO2: 97% 96%               TEDs, binder, acclimation 9.    Uncontrolled diabetes mellitus with hyperglycemia.  Hemoglobin A1c 14.2.  NovoLog 6 units 3 times daily, Levemir 40 units twice daily, Tradjenta 5 mg daily.  Provide diabetic teaching          CBG (last 3)  Recent Labs    09/04/19 1700 09/04/19 1957 09/05/19 0614  GLUCAP 233* 283* 340*   Add bedtime snack to avoid am hypoglycemia  Labile but elevated this am after pm dose reduced change to 38U qhs, cont 40U qam  10.  Hypothyroidism. Continue Synthroid  11.  Hyperlipidemia.  Lipitor 12.  Recurrent UTIs. Tripmex 100 mg daily  13.  Hypokalemia   Potassium 3.3 on 7/15, labs ordered for Monday  Supplementing with 45meq daily  14.  Hypertension   See #8 LOS: 5 days A FACE TO FACE EVALUATION WAS PERFORMED  Jaclyn [name] 09/05/2019, 9:06 AM

## 2019-09-05 NOTE — Progress Notes (Signed)
Physical Therapy Session Note  Patient Details  Name: Jaclyn [name] MRN: [id number] Date of Birth: [birth year]  Today's Date: 09/05/2019 PT Individual Time: 3335-4562 PT Individual Time Calculation (min): 72 min   Short Term Goals: Week 1:  PT Short Term Goal 1 (Week 1): STG=LTG due to ELOS  Skilled Therapeutic Interventions/Progress Updates:     Patient in w/c in the room upon PT arrival. Patient alert and agreeable to PT session. Patient denied pain during session. Used voice amplifier throughout session due to patient being HOH.   Therapeutic Activity: Bed Mobility: Patient performed sit to supine  with supervision in a flat bed without rails. Provided verbal cues for initiation due to decreased attention. Transfers: Patient performed sit to/from stand x4 from the w/c during functional mobility and x5 using blocked practice focused on hand placement on RW and reaching back to sit and x1 from the ADL couch with close supervision for safety using the RW. Provided verbal and tactile cues for hand placement on RW and reaching back to sit with intermittent carryover.  Performed a toilet transfer with close supervision. Patient was continent of bladder during toileting, performed peri-care independently and LB clothing management with min A to fasten incontinence brief.   Gait Training:  Patient ambulated >200 feet from her room to the ADL apartment and ~50 ft to the main gym using RW with CGA for safety balance. Ambulated with variable foot placement and veering from side to side, decreased gait speed, step height, and step length, increased trunk flexion, and poor safety awareness with RW (lifting RW during turns, pushing RW aside with functional tasks or in preparation for transfers). Provided verbal cues for staying close to the RW for safety, erect posture, maintaining a straight path while walking, and keeping the RW on the floor at all times for safety. Patient performed stepping over 2"  threshold using a RW x4, following PT demonstration and with cues for sequencing with CGA for safety/balance.   Patient requires increased time with all mobility due to poor attention and tangential speech throughout session. Stopped several times in standing for 30 sec-2 min to tell a story or experience she had had in the hospital. Requires mod A to redirect to tasks.   Patient in bed at end of session with breaks locked, bed alarm set, and all needs within reach.    Therapy Documentation Precautions:  Precautions Precautions: Fall Precaution Comments: left inattention (improving) Restrictions Weight Bearing Restrictions: No    Therapy/Group: Individual Therapy  Ras Kollman L Fartun Paradiso PT, DPT  09/05/2019, 6:11 PM

## 2019-09-05 NOTE — Progress Notes (Signed)
Patient ID: Jaclyn [name], female   DOB: [birth year], 84 y.o.   MRN: [id number]   Sw followed up with patient, no questions or concerns.

## 2019-09-05 NOTE — Progress Notes (Signed)
Speech Language Pathology Daily Session Note  Patient Details  Name: Persis Graffius MRN: [id number] Date of Birth: 1935-09-08  Today's Date: 09/05/2019 SLP Individual Time: 1100-1158 SLP Individual Time Calculation (min): 58 min  Short Term Goals: Week 1: SLP Short Term Goal 1 (Week 1): STG=LTG due to estimated short length of stay  Skilled Therapeutic Interventions: Skilled ST services focused on cognitive skills. Pt demonstrated recall of today's events with use of memory notebook given supervision A verbal cues. 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 SLP facilitated recall of medication names/function/time per day given visual aid pt required min A verbal cues. SLP also began facilitated medication management with x3 pill organizer, pt required min A verbal cues for problem solving and error awareness. Pt intermittently agreed to allowing children to assist with medication management due to vision deficits, but denied need for cognitive assistance. SLP recommends to continue medication management in next ST treatment session. Pt was left in room with call bell within reach and chair alarm set. ST recommends to continue skilled ST services.      Pain Pain Assessment Pain Score: 0-No pain  Therapy/Group: Individual Therapy  Kemisha Bonnette  [facility] 09/05/2019, 12:23 PM

## 2019-09-05 NOTE — Progress Notes (Signed)
Occupational Therapy Session Note  Patient Details  Name: Jaclyn [name] MRN: [id number] Date of Birth: 1935-08-23  Today's Date: 09/05/2019 OT Individual Time: 0930-1030 OT Individual Time Calculation (min): 60 min    Short Term Goals: Week 1:  OT Short Term Goal 1 (Week 1): STG = LTG due to anticipated discharge/LOS  Skilled Therapeutic Interventions/Progress Updates:    1;1. Pt received on toilet. Pt agreeable to shower. Pt completes all mobility, bathing, dressing and grooming with CGA for standing balance and min VC for sequencing. Pt requires VC for termination of grooming and bathing tasks d/t pt being very thorough. Pt requires increased time to organize self and gather needed items for dressing tasks. Pt unaware first shirt see through despite cuing requring total A to doff and change into new shirt with S. Exited session with pt seated in w/c, belt alarm on and call light in reach  Therapy Documentation Precautions:  Precautions Precautions: Fall Precaution Comments: left inattention (improving) Restrictions Weight Bearing Restrictions: No General:   Vital Signs:  Pain:   ADL: ADL Eating: Set up Where Assessed-Eating: Bed level Grooming: Contact guard Where Assessed-Grooming: Standing at sink Upper Body Dressing: Minimal assistance Where Assessed-Upper Body Dressing: Edge of bed Lower Body Dressing: Moderate assistance Where Assessed-Lower Body Dressing: Edge of bed Toileting: Minimal assistance Where Assessed-Toileting: Glass blower/designer: Psychiatric nurse Method: Counselling psychologist: Grab bars ADL Comments: patient declined bathing this session Vision   Perception    Praxis   Exercises:   Other Treatments:     Therapy/Group: Individual Therapy  Tonny Branch 09/05/2019, 10:30 AM

## 2019-09-06 ENCOUNTER — Inpatient Hospital Stay (HOSPITAL_COMMUNITY): Payer: Medicare Other | Admitting: Physical Therapy

## 2019-09-06 ENCOUNTER — Inpatient Hospital Stay (HOSPITAL_COMMUNITY): Payer: Medicare Other

## 2019-09-06 ENCOUNTER — Inpatient Hospital Stay (HOSPITAL_COMMUNITY): Payer: Medicare Other | Admitting: Occupational Therapy

## 2019-09-06 LAB — GLUCOSE, CAPILLARY
Glucose-Capillary: 235 mg/dL — ABNORMAL HIGH (ref 70–99)
Glucose-Capillary: 369 mg/dL — ABNORMAL HIGH (ref 70–99)
Glucose-Capillary: 253 mg/dL — ABNORMAL HIGH (ref 70–99)
Glucose-Capillary: 342 mg/dL — ABNORMAL HIGH (ref 70–99)

## 2019-09-06 NOTE — Progress Notes (Signed)
Physical Therapy Session Note  Patient Details  Name: Jaclyn [name] MRN: [id number] Date of Birth: 1935-06-11  Today's Date: 09/06/2019 PT Individual Time: 0915-0950 PT Individual Time Calculation (min): 35 min   Short Term Goals: Week 1:  PT Short Term Goal 1 (Week 1): STG=LTG due to ELOS  Skilled Therapeutic Interventions/Progress Updates: Pt presented in bed sleeping but easily aroused. Pt upset because "I told then I don't want therapy early in the morning". Explained that it's currently 915am so not super early and oriented to situation. Pt speaking somewhat tangential and stating "you're not being compassionate". Re-directed pt asking if she wanted to use bathroom with pt agreeable to activity. Pt performed bed mobility from mostly flat bed with supervision and used RW to ambulate to dresser CGA. Pt performed functional reaching tasks opening drawers to look for pants however only paper scrubs available. Pt then ambulated to closet performing additional reaching tasks all performed with close supervision. Pt then obtained brief and ambulated to bathroom performing toilet transfers with supervision. Pt noted to be incontinent of bladder but performed peri-care mod I with wet washcloth. Once completed pt ambulated back to bed with CGA and verbal cues for safety with RW (pt tended to lift RW when turning). Performed sit to supine transfer with supervision and left with bed alarm on, call bell within reach and needs met.      Therapy Documentation Precautions:  Precautions Precautions: Fall Precaution Comments: left inattention (improving) Restrictions Weight Bearing Restrictions: No General:   Vital Signs:  Pain: Pain Assessment Pain Scale: 0-10 Pain Score: 0-No pain Mobility:   Locomotion :    Trunk/Postural Assessment :    Balance:   Exercises:   Other Treatments:      Therapy/Group: Individual Therapy  [name] 09/06/2019, 12:50 PM

## 2019-09-06 NOTE — Progress Notes (Signed)
Occupational Therapy Session Note  Patient Details  Name: Jaclyn [name] MRN: [id number] Date of Birth: [birth year]  Today's Date: 09/06/2019 OT Individual Time: 2671-2458 OT Individual Time Calculation (min): 40 min    Short Term Goals: Week 1:  OT Short Term Goal 1 (Week 1): STG = LTG due to anticipated discharge/LOS  Skilled Therapeutic Interventions/Progress Updates:    Upon entering the room, pt seated on EOB and finishing lunch. While eating, OT discussed home set up and read letter from family member about pt's PLOF. OT discussed possible need for TTB based on home environment and to decrease fall risk. Pt agreeable to education/practice during next session. Pt verbalized need for toileting and ambulates with RW into bathroom with CGA. CGA for balance with clothing management and hygiene. Pt ambulating to stand at sink to brush teeth, wash face, and comb hair with close supervision - CGA. Pt requesting to return to bed at end of session with CGA and RW. Sit >supine with supervision. Bed alarm activated and call bell within reach.   Therapy Documentation Precautions:  Precautions Precautions: Fall Precaution Comments: left inattention (improving) Restrictions Weight Bearing Restrictions: No   Pain: Pain Assessment Pain Scale: 0-10 Pain Score: 0-No pain ADL: ADL Eating: Set up Where Assessed-Eating: Bed level Grooming: Contact guard Where Assessed-Grooming: Standing at sink Upper Body Dressing: Minimal assistance Where Assessed-Upper Body Dressing: Edge of bed Lower Body Dressing: Moderate assistance Where Assessed-Lower Body Dressing: Edge of bed Toileting: Minimal assistance Where Assessed-Toileting: Glass blower/designer: Psychiatric nurse Method: Counselling psychologist: Grab bars ADL Comments: patient declined bathing this session   Therapy/Group: Individual Therapy  Gypsy Decant 09/06/2019, 1:45 PM

## 2019-09-06 NOTE — Progress Notes (Signed)
Speech Language Pathology Daily Session Note  Patient Details  Name: Jaclyn [name] MRN: [id number] Date of Birth: 03-21-1935  Today's Date: 09/06/2019 SLP Individual Time: 2482-5003 SLP Individual Time Calculation (min): 54 min  Short Term Goals: Week 1: SLP Short Term Goal 1 (Week 1): STG=LTG due to estimated short length of stay  Skilled Therapeutic Interventions: Skilled ST services focused on cognitive skills. SLP facilitated mildly complex problem solving, error awareness and recall skills in x3 per day pill organizer, pt required supervision A verbal cues for problem solving and recall with use of visual aid and min A verbal cues for error awareness. Pt was eventually agreeable to allow family to assist with medication management. Pt was left in room with call bell within reach and bed alarm set. ST recommends to continue skilled ST services.      Pain Pain Assessment Pain Score: 0-No pain  Therapy/Group: Individual Therapy  [name]  [facility] 09/06/2019, 4:50 PM

## 2019-09-06 NOTE — Progress Notes (Signed)
Physical Therapy Session Note  Patient Details  Name: Jaclyn [name] MRN: [id number] Date of Birth: Sep 10, 1935  Today's Date: 09/06/2019 PT Individual Time: 1100-1158 PT Individual Time Calculation (min): 58 min   Short Term Goals: Week 1:  PT Short Term Goal 1 (Week 1): STG=LTG due to ELOS  Skilled Therapeutic Interventions/Progress Updates:    pt received in bed and agreeable to therapy. Pt directed in supine>sit CGA; transfer to [facility] CGA with Hand held assist. Pt then verbalized she needed to use the restroom and directed in gait training with Rolling walker at [facility] for 10' and CGA for toilet transfer. Pt CGA for return to standing with use of grab bar to Rolling walker and performed hygiene mod I; returned to [facility] 10' with Rolling walker at [facility]. Pt lead to gym in [facility] for time management and directed in standing balance training on [employer] for 63minsx2 for weight shifting to L and RLE and to midline for improved gait prep and standing balance with directional changes at min A improving to CGA; limits of stability 3x50mins with pt requiring min A for forward weight shifting and grossly CGA with BUE support at handles. Pt directed in 2x15 forward and overhead reaching on airex pad with one UE support at min A improving to CGA with VC for tech. Pt directed in gait training for 100' at [facility] and intermittent min A for walker proximity and navigation, VC for posture and safety awareness in open environment as pt is easily distracted. Pt directed in object finding in open, distracting environment, with Rolling walker at CGA-min A with VC for safety awareness with reaching in and outside of BOS with walker use for objects x10 and safety with using walker for all reaching and not leaving it behind to take several steps for navigation. Pt returned to room in [facility] and requested to return to bed, WC>bed CGA with Hand held assist and sit>supine supervision. Pt left with alarm set, All needs in reach and in good condition.  Call light in hand.    Therapy Documentation Precautions:  Precautions Precautions: Fall Precaution Comments: left inattention (improving) Restrictions Weight Bearing Restrictions: No   Pain: Pain Assessment Pain Scale: 0-10 Pain Score: 0-No pain      Therapy/Group: Individual Therapy  Junie Panning 09/06/2019, 11:57 AM

## 2019-09-06 NOTE — Plan of Care (Signed)
°  Problem: RH BOWEL ELIMINATION Goal: RH STG MANAGE BOWEL WITH ASSISTANCE Description: STG Manage Bowel with mod I Assistance. Outcome: Progressing Goal: RH STG MANAGE BOWEL W/MEDICATION W/ASSISTANCE Description: STG Manage Bowel with Medication with mod I Assistance. Outcome: Progressing   Problem: RH BLADDER ELIMINATION Goal: RH STG MANAGE BLADDER WITH ASSISTANCE Description: STG Manage Bladder With mod I Assistance Outcome: Progressing

## 2019-09-06 NOTE — Progress Notes (Signed)
[place] PHYSICAL MEDICINE & REHABILITATION PROGRESS NOTE   Subjective/Complaints:    No issues overnite, pt somnolent  ROS: Patient denies CP, SOB, N/V/D Objective:   No results found. Recent Labs    09/05/19 0530  WBC 5.7  HGB 10.6*  HCT 32.5*  PLT 290   Recent Labs    09/05/19 1055  NA 137  K 4.2  CL 102  CO2 24  GLUCOSE 260*  BUN 11  CREATININE 0.97  CALCIUM 9.2    Intake/Output Summary (Last 24 hours) at 09/06/2019 0756 Last data filed at 09/05/2019 1815 Gross per 24 hour  Intake 1108 ml  Output --  Net 1108 ml     Physical Exam: Vital Signs Blood pressure (!) 144/67, pulse 85, temperature 98.2 F (36.8 C), temperature source Oral, resp. rate 16, height 5\' 2"  (1.575 m), weight 71.5 kg, SpO2 96 %.   General: No acute distress Mood and affect are appropriate Heart: Regular rate and rhythm no rubs murmurs or extra sounds Lungs: Clear to auscultation, breathing unlabored, no rales or wheezes Abdomen: Positive bowel sounds, soft nontender to palpation, nondistended Extremities: No clubbing, cyanosis, or edema Skin: No evidence of breakdown, no evidence of rash Neuro: Alert Remains HOH Motor: 5/5 throughout, good sitting balance  Assessment/Plan: 1. Functional deficits secondary to RIght MCA infarct  which require 3+ hours per day of interdisciplinary therapy in a comprehensive inpatient rehab setting.  Physiatrist is providing close team supervision and 24 hour management of active medical problems listed below.  Physiatrist and rehab team continue to assess barriers to discharge/monitor patient progress toward functional and medical goals  Care Tool:  Bathing  Bathing activity did not occur: Refused           Bathing assist       Upper Body Dressing/Undressing Upper body dressing   What is the patient wearing?: Pull over shirt    Upper body assist Assist Level: Minimal Assistance - Patient > 75%    Lower Body  Dressing/Undressing Lower body dressing      What is the patient wearing?: Incontinence brief, Pants     Lower body assist Assist for lower body dressing: Moderate Assistance - Patient 50 - 74%     Toileting Toileting Toileting Activity did not occur Landscape architect and hygiene only): N/A (no void or bm)  Toileting assist Assist for toileting: Minimal Assistance - Patient > 75%     Transfers Chair/bed transfer  Transfers assist     Chair/bed transfer assist level: Supervision/Verbal cueing Chair/bed transfer assistive device: Programmer, multimedia   Ambulation assist      Assist level: Contact Guard/Touching assist Assistive device: Walker-rolling Max distance: >200 ft   Walk 10 feet activity   Assist     Assist level: Contact Guard/Touching assist Assistive device: Walker-rolling   Walk 50 feet activity   Assist    Assist level: Contact Guard/Touching assist Assistive device: Walker-rolling    Walk 150 feet activity   Assist    Assist level: Contact Guard/Touching assist Assistive device: Walker-rolling    Walk 10 feet on uneven surface  activity   Assist Walk 10 feet on uneven surfaces activity did not occur: Safety/medical concerns         Wheelchair     Assist Will patient use wheelchair at discharge?: Yes (long terms goals per PT) Type of Wheelchair: Manual    Wheelchair assist level: Minimal Assistance - Patient > 75% Max wheelchair distance: 150  Wheelchair 50 feet with 2 turns activity    Assist        Assist Level: Minimal Assistance - Patient > 75%   Wheelchair 150 feet activity     Assist      Assist Level: Maximal Assistance - Patient 25 - 49%   Blood pressure (!) 144/67, pulse 85, temperature 98.2 F (36.8 C), temperature source Oral, resp. rate 16, height 5\' 2"  (1.575 m), weight 71.5 kg, SpO2 96 %.  Medical Problem List and Plan: 1.  Left side weakness secondary to right MCA  small subcortical infarct in the setting of severe intracranial large vessel disease with high-grade right ICA stenosis.  Status post right CEA  Continue CIR 2.  Antithrombotics: -DVT/anticoagulation: Eliquis             -antiplatelet therapy: Aspirin 81 mg daily 3. Pain Management: Oxycodone as needed 4. Mood: Cymbalta 60 mg daily             -antipsychotic agents: N/A 5. Neuropsych: This patient is ?fully capable of making decisions on her own behalf. 6. Skin/Wound Care: Routine skin checks 7. Fluids/Electrolytes/Nutrition: Routine in and outs.  8.  Orthostasis after CEA.    ProAmatine 2.5 mg 3 times daily, decreased to BID on 7/16 with breakfast and lunch  Orthostatic positive on 7/16,  Vitals:   09/05/19 1935 09/06/19 0516  BP: 134/64 (!) 144/67  Pulse: 80 85  Resp:    Temp: 98.1 F (36.7 C) 98.2 F (36.8 C)  SpO2: 96% 96%  still with 84mmHg+ drop sit to stand will cont current dose proamatine              TEDs, binder, acclimation 9.    Uncontrolled diabetes mellitus with hyperglycemia.  Hemoglobin A1c 14.2.  NovoLog 6 units 3 times daily, Levemir 40 units twice daily, Tradjenta 5 mg daily.  Provide diabetic teaching          CBG (last 3)  Recent Labs    09/05/19 1709 09/05/19 1945 09/06/19 0534  GLUCAP 259* 267* 235*   Add bedtime snack to avoid am hypoglycemia  Still elevated in am will resume levimir 40U BID 10.  Hypothyroidism. Continue Synthroid  11.  Hyperlipidemia.  Lipitor 12.  Recurrent UTIs. Tripmex 100 mg daily  13.  Hypokalemia   Potassium 3.3 on 7/15, labs ordered for Monday  Supplementing with 12meq daily  14.  Hypertension   See #8 LOS: 6 days A FACE TO FACE EVALUATION WAS PERFORMED  Charlett Blake 09/06/2019, 7:56 AM

## 2019-09-06 NOTE — Progress Notes (Signed)
Patient ID: Jaclyn [name], female   DOB: April 13, 1935, 84 y.o.   MRN: [id number]   Patient active with [facility]

## 2019-09-07 ENCOUNTER — Inpatient Hospital Stay (HOSPITAL_COMMUNITY): Payer: Medicare Other | Admitting: Occupational Therapy

## 2019-09-07 ENCOUNTER — Inpatient Hospital Stay (HOSPITAL_COMMUNITY): Payer: Medicare Other | Admitting: Speech Pathology

## 2019-09-07 ENCOUNTER — Inpatient Hospital Stay (HOSPITAL_COMMUNITY): Payer: Medicare Other | Admitting: Physical Therapy

## 2019-09-07 LAB — GLUCOSE, CAPILLARY
Glucose-Capillary: 241 mg/dL — ABNORMAL HIGH (ref 70–99)
Glucose-Capillary: 200 mg/dL — ABNORMAL HIGH (ref 70–99)
Glucose-Capillary: 272 mg/dL — ABNORMAL HIGH (ref 70–99)
Glucose-Capillary: 325 mg/dL — ABNORMAL HIGH (ref 70–99)

## 2019-09-07 MED ORDER — INSULIN DETEMIR 100 UNIT/ML ~~LOC~~ SOLN
40.0000 [IU] | Freq: Every day | SUBCUTANEOUS | Status: DC
  Administered 2019-09-07 – 2019-09-08 (×2): 40 [IU] via SUBCUTANEOUS
  Filled 2019-09-07 (×3): qty 0.4

## 2019-09-07 MED ORDER — MIDODRINE HCL 5 MG PO TABS
5.0000 mg | ORAL_TABLET | Freq: Two times a day (BID) | ORAL | Status: DC
  Administered 2019-09-07 – 2019-09-09 (×4): 5 mg via ORAL
  Filled 2019-09-07 (×3): qty 1

## 2019-09-07 NOTE — Progress Notes (Signed)
Occupational Therapy Session Note  Patient Details  Name: Jaclyn [name] MRN: [id number] Date of Birth: Dec 18, 1935  Today's Date: 09/07/2019 OT Individual Time: 2706-2376 and 1530-1600 OT Individual Time Calculation (min): 56 min and 30 mins   Short Term Goals: Week 1:  OT Short Term Goal 1 (Week 1): STG = LTG due to anticipated discharge/LOS  Skilled Therapeutic Interventions/Progress Updates:    Session 1: Upon entering the room, pt supine in bed and sleeping soundly with covers pulled over head. OT attempting to wake pt gently who reports, " I am just so sleepy." Pt is agreeable to OT intervention. Pt performs bed mobility with supervision and ambulates into bathroom with close supervision. Pt performing toilet transfer, clothing management, and hygiene with supervision this session and pt was able to have BM. Pt does report some constipation but "feeling better since I went". Pt standing at sink for hand hygiene with min cuing for safety awareness. Pt ambulating 200' with RW to ADL apartment with close supervision. Pt very fatigued once arriving at apartment and sat on low sofa. Pt reports sofa similar to hers at home and lays down on sofa with feet up for comfort with supervision overall. Pt resting for several minutes and then standing from sofa with min cuing for hand placement and supervision. Pt ambulating on carpeted surface without issue and then ambulating back to room in same manner as above. Pt requesting to return to bed with supervision for sit >supine. Bed alarm activated and call bell within reach.   Session 2:  Upon entering the room, pt supine in bed and sleeping. Pt agreeable to OT intervention with no c/o pain this session. Pt does report need for toileting and ambulates to bathroom with close supervision and use of RW. Pt performing clothing management and hygiene with supervision and min cuing for safety awareness before standing at sink for hand hygiene. Pt returning to sit  on edge of bed. Mat placed on floor for safety and OT providing education and demonstrations for floor transfer. Pt returning demonstration with min cuing and close supervision. Pt requesting to return to bed at end of session. All needs within reach and bed alarm activated.   Therapy Documentation Precautions:  Precautions Precautions: Fall Precaution Comments: left inattention (improving) Restrictions Weight Bearing Restrictions: No ADL: ADL Eating: Set up Where Assessed-Eating: Bed level Grooming: Contact guard Where Assessed-Grooming: Standing at sink Upper Body Dressing: Minimal assistance Where Assessed-Upper Body Dressing: Edge of bed Lower Body Dressing: Moderate assistance Where Assessed-Lower Body Dressing: Edge of bed Toileting: Minimal assistance Where Assessed-Toileting: Glass blower/designer: Psychiatric nurse Method: Counselling psychologist: Grab bars ADL Comments: patient declined bathing this session Vision   Perception    Praxis   Exercises:   Other Treatments:     Therapy/Group: Individual Therapy  Gypsy Decant 09/07/2019, 11:29 AM

## 2019-09-07 NOTE — Progress Notes (Signed)
Patient ID: Jaclyn [name], female   DOB: 12/04/1935, 84 y.o.   MRN: [id number]   Sw left voicemail for grand daughter to return call for sw to provide team conference updates.  [place], [place]

## 2019-09-07 NOTE — Progress Notes (Signed)
Physical Therapy Session Note  Patient Details  Name: Jaclyn [name] MRN: [id number] Date of Birth: 1935/05/08  Today's Date: 09/07/2019 PT Individual Time: 0805-0900 PT Individual Time Calculation (min): 55 min   Short Term Goals: Week 1:  PT Short Term Goal 1 (Week 1): STG=LTG due to ELOS  Skilled Therapeutic Interventions/Progress Updates: Pt presented at EOB completing breakfast and agreeable to therapy once completed meal. Once completed pt requesting to change clothes. PTA assisted pt in locating clothing she wanted to wear today and obtained clean brief. Pt then performed ambulatory transfer supervision overall to bathroom. Pt performed toilet transfers with supervision (+void) with supervision peri-care with washcloth. Pt doffed brief with supervision and PTA provided minA for donning new brief for time management. Pt then ambulated to sink to perform hand hygiene in standing and then returned to EOB to complete dressing. Pt donned pants and shirt with set up and then performed stand pivot with RW and supervision to w/c. PTA transported pt to rehab gym for time management and energy conservation. Pt participated in obstacle course including stepping onto 4in step with RW, walking on compliant/uneven surface, stepping over thresholds, and weaving through cones. Pt demonstrated overall fair safety with RW use on obstacle course. Pt ambulated back to room from nsg station with RW and overall supervision. Pt returned to bed a end of session and remained sitting EOB per pt request. Pt left at EOB with bed alarm on, call bell within reach and needs met.      Therapy Documentation Precautions:  Precautions Precautions: Fall Precaution Comments: left inattention (improving) Restrictions Weight Bearing Restrictions: No   Therapy/Group: Individual Therapy  Manus Weedman  Eriq Hufford, PTA  09/07/2019, 12:50 PM

## 2019-09-07 NOTE — Patient Care Conference (Signed)
Inpatient RehabilitationTeam Conference and Plan of Care Update Date: 09/07/2019   Time: 1:32 PM    Patient Name: Jaclyn [name]      Medical Record Number: [id number]  Date of Birth: [birth year] Sex: Female         Room/Bed: 4W04C/4W04C-01 Payor Info: Payor: [place] / Plan: [employer] MEDICARE / Product Type: *No Product type* /    Admit Date/Time:  08/31/2019  7:12 PM  Primary Diagnosis:  Right middle cerebral artery stroke [facility])  Hospital Problems: Principal Problem:   Right middle cerebral artery stroke ([place]) Active Problems:   Hypokalemia   Labile blood glucose   Uncontrolled type 2 diabetes mellitus with hyperglycemia (HCC)   Orthostatic hypotension   Elevated blood pressure reading    Expected Discharge Date: Expected Discharge Date: 09/09/19  Team Members Present: Physician leading conference: Dr. Alysia Penna Care Coodinator Present: Dorien Chihuahua, RN, BSN, CRRN;Christina Sampson Goon, BSW Nurse Present: Ellison Carwin, LPN PT Present: Phylliss Bob, PTA OT Present: Darleen Crocker, OT SLP Present: Weston Anna, SLP PPS Coordinator present : Ileana Ladd, PT     Current Status/Progress Goal Weekly Team Focus  Bowel/Bladder   Patient is continent of bowel and can be incontinent/continent of bladder due to urgency. LBM 09/06/19  obtain continence of bowel and bladder  offer toileting q2-3 hours and PRN   Swallow/Nutrition/ Hydration             ADL's   CGA with RW for all self care and functional transfers  supervision overall  L inattention, L NMR, balance, functional mobility/transfers, endurance, functinal cognition, and safety awareness   Mobility   supervision bed mobility, CGA transfers, CGA to minA gait with RW due to poor safety awareness and high distractablity  mod I transfers, supervision gait  higher level balance, family ed, d/c planning   Communication             Safety/Cognition/ Behavioral Observations  Min A   Supervision A  problem solving, intectuall/emergent awareness, sustained attention,and  recall strategies. family needs education   Pain   patient denies pain  pain level is <2 with or without activity  q shift pain assessment and PRN   Skin   patient has bruising in abdomen and MASD in L breast  prevent skin from further breakdown and maintain good skin condition  q shift skin assessment and PRN     Team Discussion:  Discharge Planning/Teaching Needs:  Goal to discharge home with family to provde care ([name], [name], [name])  Will schedule with family if reccomended   Current Update: on target  Current Barriers to Discharge:  Medication compliance and CMM diet and DM management compliance  Possible Resolutions to Barriers: Family education on DM management and CMM diet; secondary stroke risks/treatments.  Patient on target to meet rehab goals: yes Recommend OP follow up however limited transportation and unsure if patient will attend; [facility] follow up arrangements made.  *See Care Plan and progress notes for long and short-term goals.   Revisions to Treatment Plan:  Family education on safety awareness, problem solving issues and need for assistance/supervision to solve issues    Medical Summary Current Status: Hypokalemia resolved, orthostasis improving, Weekly Focus/Goal: Wean ProAmatine  Barriers to Discharge: Medical stability  Barriers to Discharge Comments: Still monitoring for orthostatic hypotension Possible Resolutions to Barriers: Medication management as above   Continued Need for Acute Rehabilitation Level of Care: The patient requires daily medical management by a physician with specialized training in  physical medicine and rehabilitation for the following reasons: Direction of a multidisciplinary physical rehabilitation program to maximize functional independence : Yes Medical management of patient stability for increased activity during participation in an  intensive rehabilitation regime.: Yes Analysis of laboratory values and/or radiology reports with any subsequent need for medication adjustment and/or medical intervention. : Yes   I attest that I was present, lead the team conference, and concur with the assessment and plan of the team.   Dorien Chihuahua B 09/07/2019, 1:32 PM

## 2019-09-07 NOTE — Progress Notes (Signed)
Jaclyn [name] PHYSICAL MEDICINE & REHABILITATION PROGRESS NOTE   Subjective/Complaints:    Patient awakens from sleep she is very hard of hearing so she needs physical stimulation.  She would like to go home on Friday.  ROS: Patient denies CP, SOB, N/V/D Objective:   No results found. Recent Labs    09/05/19 0530  WBC 5.7  HGB 10.6*  HCT 32.5*  PLT 290   Recent Labs    09/05/19 1055  NA 137  K 4.2  CL 102  CO2 24  GLUCOSE 260*  BUN 11  CREATININE 0.97  CALCIUM 9.2    Intake/Output Summary (Last 24 hours) at 09/07/2019 0954 Last data filed at 09/07/2019 0700 Gross per 24 hour  Intake 762 ml  Output --  Net 762 ml     Physical Exam: Vital Signs Blood pressure (!) 125/53, pulse 76, temperature 99 F (37.2 C), temperature source Oral, resp. rate 18, height 5\' 2"  (1.575 m), weight 71.5 kg, SpO2 96 %.    General: No acute distress Mood and affect are appropriate Heart: Regular rate and rhythm no rubs murmurs or extra sounds Lungs: Clear to auscultation, breathing unlabored, no rales or wheezes Abdomen: Positive bowel sounds, soft nontender to palpation, nondistended Extremities: No clubbing, cyanosis, or edema Skin: No evidence of breakdown, no evidence of rash Remains HOH Motor: 5/5 throughout, good sitting balance  Assessment/Plan: 1. Functional deficits secondary to RIght MCA infarct  which require 3+ hours per day of interdisciplinary therapy in a comprehensive inpatient rehab setting.  Physiatrist is providing close team supervision and 24 hour management of active medical problems listed below.  Physiatrist and rehab team continue to assess barriers to discharge/monitor patient progress toward functional and medical goals  Care Tool:  Bathing  Bathing activity did not occur: Refused           Bathing assist       Upper Body Dressing/Undressing Upper body dressing   What is the patient wearing?: Pull over shirt    Upper body assist Assist  Level: Minimal Assistance - Patient > 75%    Lower Body Dressing/Undressing Lower body dressing      What is the patient wearing?: Incontinence brief, Pants     Lower body assist Assist for lower body dressing: Moderate Assistance - Patient 50 - 74%     Toileting Toileting Toileting Activity did not occur Landscape architect and hygiene only): N/A (no void or bm)  Toileting assist Assist for toileting: Contact Guard/Touching assist     Transfers Chair/bed transfer  Transfers assist     Chair/bed transfer assist level: Supervision/Verbal cueing Chair/bed transfer assistive device: Museum/gallery exhibitions officer assist      Assist level: Contact Guard/Touching assist Assistive device: Walker-rolling Max distance: >200 ft   Walk 10 feet activity   Assist     Assist level: Contact Guard/Touching assist Assistive device: Walker-rolling   Walk 50 feet activity   Assist    Assist level: Contact Guard/Touching assist Assistive device: Walker-rolling    Walk 150 feet activity   Assist    Assist level: Contact Guard/Touching assist Assistive device: Walker-rolling    Walk 10 feet on uneven surface  activity   Assist Walk 10 feet on uneven surfaces activity did not occur: Safety/medical concerns         Wheelchair     Assist Will patient use wheelchair at discharge?: Yes (long terms goals per PT) Type of Wheelchair: Manual  Wheelchair assist level: Minimal Assistance - Patient > 75% Max wheelchair distance: 150    Wheelchair 50 feet with 2 turns activity    Assist        Assist Level: Minimal Assistance - Patient > 75%   Wheelchair 150 feet activity     Assist      Assist Level: Maximal Assistance - Patient 25 - 49%   Blood pressure (!) 125/53, pulse 76, temperature 99 F (37.2 C), temperature source Oral, resp. rate 18, height 5\' 2"  (1.575 m), weight 71.5 kg, SpO2 96 %.  Medical Problem List and  Plan: 1.  Left side weakness secondary to right MCA small subcortical infarct in the setting of severe intracranial large vessel disease with high-grade right ICA stenosis.  Status post right CEA  Continue CIR 2.  Antithrombotics: -DVT/anticoagulation: Eliquis             -antiplatelet therapy: Aspirin 81 mg daily 3. Pain Management: Oxycodone as needed 4. Mood: Cymbalta 60 mg daily             -antipsychotic agents: N/A 5. Neuropsych: This patient is ?fully capable of making decisions on her own behalf. 6. Skin/Wound Care: Routine skin checks 7. Fluids/Electrolytes/Nutrition: Routine in and outs.  8.  Orthostasis after CEA.    ProAmatine 2.5 mg 3 times daily, decreased to BID on 7/16 with breakfast and lunch  Orthostatic positive on 7/16,  Vitals:   09/06/19 1957 09/07/19 0525  BP: (!) 151/73 (!) 125/53  Pulse: 88 76  Resp:  18  Temp: 99.1 F (37.3 C) 99 F (37.2 C)  SpO2: 100% 96%  orthostatic HTN improved will reduce proamatine dose             TEDs, binder, acclimation 9.    Uncontrolled diabetes mellitus with hyperglycemia.  Hemoglobin A1c 14.2.  NovoLog 6 units 3 times daily, Levemir 40 units twice daily, Tradjenta 5 mg daily.  Provide diabetic teaching          CBG (last 3)  Recent Labs    09/06/19 1650 09/06/19 2121 09/07/19 0539  GLUCAP 253* 342* 241*   Add bedtime snack to avoid am hypoglycemia  Still elevated in am will resume levimir 40U BID 10.  Hypothyroidism. Continue Synthroid  11.  Hyperlipidemia.  Lipitor 12.  Recurrent UTIs. Tripmex 100 mg daily  13.  Hypokalemia   Resolved K+ 4.1 on 7/19  Supplementing with 20meq daily  14.  Hypertension   See #8 LOS: 7 days A FACE TO FACE EVALUATION WAS PERFORMED  Jaclyn [name] 09/07/2019, 9:54 AM

## 2019-09-07 NOTE — Progress Notes (Signed)
Speech Language Pathology Daily Session Note  Patient Details  Name: Jaclyn [name] MRN: [id number] Date of Birth: 1935-06-28  Today's Date: 09/07/2019 SLP Individual Time: 1230-1259 SLP Individual Time Calculation (min): 29 min  Short Term Goals: Week 1: SLP Short Term Goal 1 (Week 1): STG=LTG due to estimated short length of stay  Skilled Therapeutic Interventions: Pt was seen for skilled ST targeting cognitive goals. Pt recalled daily information pertaining to earler therapy sessions with Supervision A verbal cues and use of memory notebook. SLP further facilitated session with a semi-complex monthly calendar scheduling task, during which she required Min faded to Supervision A verbal and visual cues for problem solving and use of calendar as external aid. Increased cueing required for organizational strategies throughout task.Pt's processing still noted to be delayed, requiring extra time to complete structured tasks. Calendar task not finished due to time constraints, but will target at next available date. Pt left sitting edge of bed with alarm set and needs within reach. Continue per current plan of care.        Pain Pain Assessment Pain Scale: 0-10 Pain Score: 0-No pain Faces Pain Scale: No hurt  Therapy/Group: Individual Therapy  Arbutus Leas 09/07/2019, 7:09 AM

## 2019-09-07 NOTE — Plan of Care (Signed)
°  Problem: Consults Goal: RH STROKE PATIENT EDUCATION Description: See Patient Education module for education specifics  Outcome: Progressing   Problem: RH COGNITION-NURSING Goal: RH STG USES MEMORY AIDS/STRATEGIES W/ASSIST TO PROBLEM SOLVE Description: STG Uses Memory Aids/Strategies With cues/reminders Assistance to Problem Solve. Outcome: Progressing

## 2019-09-07 NOTE — Discharge Summary (Signed)
Physician Discharge Summary  Patient ID: Jaclyn [name] MRN: [id number] DOB/AGE: [birth year] 84 y.o.  Admit date: 08/31/2019 Discharge date: 09/09/2019  Discharge Diagnoses:  Principal Problem:   Right middle cerebral artery stroke [facility] Active Problems:   Hypokalemia   Labile blood glucose   Uncontrolled type 2 diabetes mellitus with hyperglycemia (HCC)   Orthostatic hypotension   Elevated blood pressure reading DVT prophylaxis Hyperlipidemia Recurrent UTIs  Discharged Condition: Stable  Significant Diagnostic Studies: CT ANGIO HEAD W OR WO CONTRAST  Result Date: 08/29/2019 CLINICAL DATA:  84 year old female with neurologic deficit. Small right hemisphere white matter lacunar infarct on MRI 08/21/2019, and also recent CTA head and neck with extensive atherosclerosis which have progressed since 2019. EXAM: CT ANGIOGRAPHY HEAD AND NECK TECHNIQUE: Multidetector CT imaging of the head and neck was performed using the standard protocol during bolus administration of intravenous contrast. Multiplanar CT image reconstructions and MIPs were obtained to evaluate the vascular anatomy. Carotid stenosis measurements (when applicable) are obtained utilizing NASCET criteria, using the distal internal carotid diameter as the denominator. CONTRAST:  61mL OMNIPAQUE IOHEXOL 350 MG/ML SOLN COMPARISON:  CTA head and neck 08/22/2019. FINDINGS: CT HEAD Brain: Calcified atherosclerosis at the skull base. Stable gray-white matter differentiation throughout the brain. Patchy and confluent bilateral white matter hypodensity has not significantly changed. No midline shift, ventriculomegaly, mass effect, evidence of mass lesion, intracranial hemorrhage or evidence of cortically based acute infarction. Calvarium and skull base: No acute osseous abnormality identified. Paranasal sinuses: Visualized paranasal sinuses and mastoids are stable and well pneumatized. Orbits: No acute orbit or scalp soft tissue finding. CTA NECK  Skeleton: Stable.  No acute osseous abnormality identified. Upper chest: New small to moderate bilateral layering pleural effusions. Stable apical lung scarring. No superior mediastinal lymphadenopathy. Visible central pulmonary arteries appear patent. Other neck: New postoperative changes to the right lower neck, see right carotid findings below. Trace postoperative gas in the right neck. No rim enhancing or drainable fluid collection. Aortic arch: Stable 3 vessel arch configuration an arch atherosclerosis. Right carotid system: Stable brachiocephalic artery plaque without stenosis. Mild right CCA plaque without stenosis proximal to the bifurcation. New right carotid endarterectomy since 08/22/2019, with widely patent right carotid bifurcation and cervical right ICA now to the skull base. Left carotid system: Stable since 08/22/2019. Complex plaque at the proximal left ICA with an estimated 70 % stenosis with respect to the distal vessel (series 9, image 105). Stable tortuosity of the left ICA just distal to the bulb. Vertebral arteries: Stable on the right with no hemodynamically significant stenosis, dominant right vertebral artery. But more apparent on these images today is high-grade stenosis of the proximal left subclavian artery due to bulky calcified plaque approaching a radiographic string sign on series 9, image 154. Stable calcified plaque at the non dominant left vertebral artery origin with only mild stenosis. Stable non dominant left vertebral artery to the skull base. CTA HEAD Posterior circulation: Dominant right vertebral artery supplies the basilar as before with intermittent mild plaque and no significant stenosis. Patent right PICA origin. Tandem severe stenoses of the distal left V4 segment are unchanged. Stable irregular basilar artery without high-grade stenosis. Stable PCA and SCA origins with severe bilateral P1/P2 stenoses. Bilateral PCA branches appears stable. Anterior circulation: Both  ICA siphons remain patent and heavily calcified. Moderate to severe bilateral siphon stenosis appears stable, including in the supraclinoid segment on series 13, image 98. Stable carotid termini, MCA and ACA origins. ACAs remain normal aside from mild irregularity. Mild  to moderate bilateral MCA M1 stenoses are stable. Additional bilateral MCA branch irregularity again noted and stable from the recent CTA. Venous sinuses: Early contrast timing, not well evaluated today. Anatomic variants: Unchanged. Review of the MIP images confirms the above findings IMPRESSION: 1. Right Carotid Endarterectomy since the recent CTA on 08/22/2019 with no adverse features. 2. Also, high-grade stenosis of the proximal Left Subclavian Artery is more apparent today (series 9, image 154), approaching a Radiographic-String-Sign. The non dominant left vertebral artery is stable. 3. Otherwise stable extensive atherosclerosis and stenosis in the head and neck as described on 08/22/2019, most notable for - Severe stenosis of the Left vertebral Artery distal V4 segment. - Left ICA 70% stenosis in the neck. - Moderate to severe Bilateral ICA siphon stenosis. - Severe bilateral PCA P1/P2 stenoses. - Moderate Right MCA distal M1 stenosis. 4. Continued stable non contrast CT appearance of the brain. No new intracranial abnormality. 5. New small to moderate bilateral layering pleural effusions. Electronically Signed   By: Genevie Ann M.D.   On: 08/29/2019 03:32   CT Head Wo Contrast  Result Date: 08/21/2019 CLINICAL DATA:  Neuro deficits. Subacute arm weakness. Increasingly confused since Friday. EXAM: CT HEAD WITHOUT CONTRAST TECHNIQUE: Contiguous axial images were obtained from the base of the skull through the vertex without intravenous contrast. COMPARISON:  01/24/2019 FINDINGS: Brain: Moderate central and cortical atrophy. Periventricular white matter changes are consistent with small vessel disease. Vascular: Dense atherosclerotic calcification  of the internal carotid arteries. No hyperdense vessels. Skull: Normal. Negative for fracture or focal lesion. Sinuses/Orbits: No acute finding. Other: None. IMPRESSION: 1. No evidence for acute intracranial abnormality. 2. Atrophy and small vessel disease. Electronically Signed   By: Nolon Nations M.D.   On: 08/21/2019 20:58   CT ANGIO NECK W OR WO CONTRAST  Result Date: 08/29/2019 CLINICAL DATA:  84 year old female with neurologic deficit. Small right hemisphere white matter lacunar infarct on MRI 08/21/2019, and also recent CTA head and neck with extensive atherosclerosis which have progressed since 2019. EXAM: CT ANGIOGRAPHY HEAD AND NECK TECHNIQUE: Multidetector CT imaging of the head and neck was performed using the standard protocol during bolus administration of intravenous contrast. Multiplanar CT image reconstructions and MIPs were obtained to evaluate the vascular anatomy. Carotid stenosis measurements (when applicable) are obtained utilizing NASCET criteria, using the distal internal carotid diameter as the denominator. CONTRAST:  12mL OMNIPAQUE IOHEXOL 350 MG/ML SOLN COMPARISON:  CTA head and neck 08/22/2019. FINDINGS: CT HEAD Brain: Calcified atherosclerosis at the skull base. Stable gray-white matter differentiation throughout the brain. Patchy and confluent bilateral white matter hypodensity has not significantly changed. No midline shift, ventriculomegaly, mass effect, evidence of mass lesion, intracranial hemorrhage or evidence of cortically based acute infarction. Calvarium and skull base: No acute osseous abnormality identified. Paranasal sinuses: Visualized paranasal sinuses and mastoids are stable and well pneumatized. Orbits: No acute orbit or scalp soft tissue finding. CTA NECK Skeleton: Stable.  No acute osseous abnormality identified. Upper chest: New small to moderate bilateral layering pleural effusions. Stable apical lung scarring. No superior mediastinal lymphadenopathy. Visible  central pulmonary arteries appear patent. Other neck: New postoperative changes to the right lower neck, see right carotid findings below. Trace postoperative gas in the right neck. No rim enhancing or drainable fluid collection. Aortic arch: Stable 3 vessel arch configuration an arch atherosclerosis. Right carotid system: Stable brachiocephalic artery plaque without stenosis. Mild right CCA plaque without stenosis proximal to the bifurcation. New right carotid endarterectomy since 08/22/2019, with widely  patent right carotid bifurcation and cervical right ICA now to the skull base. Left carotid system: Stable since 08/22/2019. Complex plaque at the proximal left ICA with an estimated 70 % stenosis with respect to the distal vessel (series 9, image 105). Stable tortuosity of the left ICA just distal to the bulb. Vertebral arteries: Stable on the right with no hemodynamically significant stenosis, dominant right vertebral artery. But more apparent on these images today is high-grade stenosis of the proximal left subclavian artery due to bulky calcified plaque approaching a radiographic string sign on series 9, image 154. Stable calcified plaque at the non dominant left vertebral artery origin with only mild stenosis. Stable non dominant left vertebral artery to the skull base. CTA HEAD Posterior circulation: Dominant right vertebral artery supplies the basilar as before with intermittent mild plaque and no significant stenosis. Patent right PICA origin. Tandem severe stenoses of the distal left V4 segment are unchanged. Stable irregular basilar artery without high-grade stenosis. Stable PCA and SCA origins with severe bilateral P1/P2 stenoses. Bilateral PCA branches appears stable. Anterior circulation: Both ICA siphons remain patent and heavily calcified. Moderate to severe bilateral siphon stenosis appears stable, including in the supraclinoid segment on series 13, image 98. Stable carotid termini, MCA and ACA  origins. ACAs remain normal aside from mild irregularity. Mild to moderate bilateral MCA M1 stenoses are stable. Additional bilateral MCA branch irregularity again noted and stable from the recent CTA. Venous sinuses: Early contrast timing, not well evaluated today. Anatomic variants: Unchanged. Review of the MIP images confirms the above findings IMPRESSION: 1. Right Carotid Endarterectomy since the recent CTA on 08/22/2019 with no adverse features. 2. Also, high-grade stenosis of the proximal Left Subclavian Artery is more apparent today (series 9, image 154), approaching a Radiographic-String-Sign. The non dominant left vertebral artery is stable. 3. Otherwise stable extensive atherosclerosis and stenosis in the head and neck as described on 08/22/2019, most notable for - Severe stenosis of the Left vertebral Artery distal V4 segment. - Left ICA 70% stenosis in the neck. - Moderate to severe Bilateral ICA siphon stenosis. - Severe bilateral PCA P1/P2 stenoses. - Moderate Right MCA distal M1 stenosis. 4. Continued stable non contrast CT appearance of the brain. No new intracranial abnormality. 5. New small to moderate bilateral layering pleural effusions. Electronically Signed   By: Genevie Ann M.D.   On: 08/29/2019 03:32   MR BRAIN WO CONTRAST  Result Date: 08/29/2019 CLINICAL DATA:  Stroke, follow-up. EXAM: MRI HEAD WITHOUT CONTRAST TECHNIQUE: Multiplanar, multiecho pulse sequences of the brain and surrounding structures were obtained without intravenous contrast. COMPARISON:  CT angiogram head/neck 08/29/2019, CT angiogram head/neck 08/22/2019, brain MRI 08/21/2019 FINDINGS: Brain: At the provider's request, a limited protocol MRI was performed. Only axial and coronal diffusion-weighted sequences, as well as an axial T2/FLAIR sequence, were obtained. Redemonstrated is a 6 mm, now subacute, infarct within the posterior right frontal lobe periventricular white matter (series 2, image 35). However new as compared  to the MRI of 08/21/2019, there are small acute/early subacute cortical infarcts within the right pre and postcentral gyri measuring up to 10 mm (series 2, image 42) (series 2, image 41). Also new from this prior examination, there is a punctate acute/early subacute infarct within the Peri ventricle right parietooccipital lobes (series 2, image 24). Stable background moderate patchy T2/FLAIR hyperintensity within the cerebral white matter and pons which is nonspecific, but consistent with chronic small vessel ischemic disease. Redemonstrated chronic lacunar infarct within the anterior right external capsule.  Stable, mild generalized parenchymal atrophy. Vascular: Flow voids poorly assessed on the acquired sequences. Skull and upper cervical spine: No focal marrow lesion is identified on the acquired sequences. Sinuses/Orbits: No acute orbital abnormality identified on the acquired sequences. Mild ethmoid sinus mucosal thickening. IMPRESSION: Limited protocol brain MRI as described. Small (measuring up to 10 mm) acute/early subacute cortical infarcts within the right pre and postcentral gyri, new as compared to prior MRI 08/21/2019. Also new from this prior exam, there is a punctate acute/early subacute infarct within the periventricular right frontoparietal lobes. Redemonstrated 6 mm, now subacute, infarct within the posterior right frontal lobe periventricular white matter. Stable background mild generalized parenchymal atrophy and moderate chronic small vessel ischemic disease. Mild ethmoid sinus mucosal thickening. Electronically Signed   By: Kellie Simmering DO   On: 08/29/2019 09:14   MR BRAIN WO CONTRAST  Result Date: 08/21/2019 CLINICAL DATA:  Initial evaluation for increased confusion, focal neural deficit. EXAM: MRI HEAD WITHOUT CONTRAST TECHNIQUE: Multiplanar, multiecho pulse sequences of the brain and surrounding structures were obtained without intravenous contrast. COMPARISON:  Prior head CT from earlier  the same day. FINDINGS: Brain: Examination mildly degraded by motion artifact. Generalized age-related cerebral atrophy. Patchy and confluent T2/FLAIR hyperintensity within the periventricular and deep white matter of both cerebral hemispheres most consistent with chronic small vessel ischemic disease, moderate in nature. Patchy involvement of the pons noted. 6 mm acute ischemic infarcts seen involving the periventricular white matter of the posterior right frontal corona radiata/centrum semi ovale (series 5, image 83). No associated hemorrhage or mass effect. No other evidence for acute or subacute ischemia. Gray-white matter differentiation otherwise maintained. No encephalomalacia to suggest chronic cortical infarction. No acute intracranial hemorrhage. Single chronic microhemorrhage noted at the inferior cerebellum, likely hypertensive in nature. No other evidence for chronic intracranial hemorrhage. No mass lesion, midline shift or mass effect. Diffuse ventricular prominence most likely related to global parenchymal volume loss without hydrocephalus. No extra-axial fluid collection. Pituitary gland suprasellar region within normal limits. Midline structures intact. Vascular: Major intracranial vascular flow voids are maintained. Skull and upper cervical spine: Craniocervical junction within normal limits. Bone marrow signal intensity normal. No scalp soft tissue abnormality. Sinuses/Orbits: Patient status post ocular lens replacement on the left. Globes and orbital soft tissues demonstrate no acute finding. Mild scattered mucosal thickening noted throughout the ethmoidal air cells and maxillary sinuses. Trace bilateral mastoid effusions noted, of doubtful significance. Visualized nasopharynx within normal limits. Other: None. IMPRESSION: 1. 6 mm acute ischemic nonhemorrhagic periventricular white matter infarct involving the posterior right frontal corona radiata/centrum semi ovale. 2. No other acute  intracranial abnormality. 3. Age-related cerebral atrophy with moderate chronic microvascular ischemic disease. Electronically Signed   By: Jeannine Boga M.D.   On: 08/21/2019 23:44   CT CORONARY MORPH W/CTA COR W/SCORE W/CA W/CM &/OR WO/CM  Addendum Date: 08/24/2019   ADDENDUM REPORT: 08/24/2019 20:28 EXAM: CT FFR ANALYSIS CLINICAL DATA:  84 year old female with abnormal coronary CTA FINDINGS: FFRct analysis was performed on the original cardiac CT angiogram dataset. Diagrammatic representation of the FFRct analysis is provided in a separate PDF document in PACS. This dictation was created using the PDF document and an interactive 3D model of the results. 3D model is not available in the EMR/PACS. Normal FFR range is >0.80. 1. Left Main: 0.99. 2. LAD: Proximal: 0.97, mid 0.82, distal: Small lumen < 2 mm. 3. D1: 0.77. 4. LCX: 0.94. 5. RCA: CT FFR not available secondary to motion. IMPRESSION: 1. CT FFR analysis  didn't show any significant stenoses in left main or proximal/mid LAD. CT FFR is not available for RCA secondary to motion. Aggressive medical management with high dose statin, aspirin and beta-blockers is recommended. Electronically Signed   By: Ena Dawley   On: 08/24/2019 20:28   Addendum Date: 08/24/2019   ADDENDUM REPORT: 08/24/2019 19:19 CLINICAL DATA:  84 year old female with h/o symptomatic carotid stenosis scheduled for an endarterectomy. EXAM: Cardiac/Coronary  CTA TECHNIQUE: The patient was scanned on a [employer]. FINDINGS: A 100 kV prospective scan was triggered in the descending thoracic aorta at 111 HU's. Axial non-contrast 3 mm slices were carried out through the heart. The data set was analyzed on a dedicated work station and scored using the [employer]. Gantry rotation speed was 250 msecs and collimation was .6 mm. 50 mg of PO Metoprolol and 0.8 mg of sl NTG was given. The 3D data set was reconstructed in 5% intervals of the 67-82 % of the R-R cycle. Diastolic  phases were analyzed on a dedicated work station using MPR, MIP and VRT modes. The patient received 80 cc of contrast. Aorta: Normal size. Moderate diffuse atherosclerotic disease and calcifications. No dissection. Aortic Valve: Trileaflet with minimal calcifications. Coronary Arteries:  Normal coronary origin.  Right dominance. RCA is a medium caliber dominant artery that gives rise to PDA and PLA. There is mild calcified plaque in the proximal and mid portion with stenosis 25-49%. Distal RCA, PDA and PLA have minimal plaque. Left main is a large artery that gives rise to LAD and LCX arteries. Left main has a moderate calcified plaque in the distal portion with stenosis 50-69%. LAD is a large vessel that gives rise to one small diagonal artery. Proximal LAD has moderate mixed plaque with stenosis 50-69%. Mid and distal LAD are affected by motion. D1 is a small artery affected by motion. LCX is a small lumen non-dominant artery that gives rise to one small OM1 branch. OM1 has moderate calcified plaque in the proximal portion with stenosis 50-69%. Other findings: Normal pulmonary vein drainage into the left atrium. Normal left atrial appendage without a thrombus. Normal size of the pulmonary artery. IMPRESSION: 1. Coronary calcium score of 663. This was 94 percentile for age and sex matched control. 2. Normal coronary origin with right dominance. 3. CAD-RADS 3. Moderate stenosis. Study interpretation is affected by motion and poor vasodilation. There is moderate plaque in the distal left main and proximal LAD. Aggressive medical management is recommended. Additional analysis with CT FFR will be submitted. Electronically Signed   By: Ena Dawley   On: 08/24/2019 19:19   Result Date: 08/24/2019 EXAM: OVER-READ INTERPRETATION  CT CHEST The following report is an over-read performed by radiologist Dr. Suzy Bouchard of [facility], [place] on 08/24/2019. This over-read does not include interpretation of cardiac  or coronary anatomy or pathology. The coronary CTA interpretation by the cardiologist is attached. COMPARISON:  None. FINDINGS: Limited view of the lung parenchyma demonstrates no suspicious nodularity. Airways are normal. Limited view of the mediastinum demonstrates no adenopathy. Esophagus normal. Limited view of the upper abdomen unremarkable. Limited view of the skeleton and chest wall is unremarkable. IMPRESSION: No significant extracardiac findings. Electronically Signed: By: Suzy Bouchard M.D. On: 08/24/2019 14:24   EEG adult  Result Date: 08/30/2019 [name], MD     08/30/2019  4:31 PM Patient Name: Jaclyn [name] MRN: [id number] Epilepsy Attending: [name] Referring Physician/Provider: Dr Rosalin Hawking Date: 08/30/2019 Duration: 24.27 mins Patient history:  84yo F with acute right hemispheric strokes and episodes of transient aphasia. EEG to evaluate for seizure Level of alertness: Awake, asleep AEDs during EEG study: None Technical aspects: This EEG study was done with scalp electrodes positioned according to the 10-20 International system of electrode placement. Electrical activity was acquired at a sampling rate of 500Hz  and reviewed with a high frequency filter of 70Hz  and a low frequency filter of 1Hz . EEG data were recorded continuously and digitally stored. Description: The posterior dominant rhythm consists of 7.5-8 Hz activity of moderate voltage (25-35 uV) seen predominantly in posterior head regions, symmetric and reactive to eye opening and eye closing. Sleep was characterized by vertex waves, sleep spindles (12 to 14 Hz), maximal frontocentral region. Hyperventilation and photic stimulation were not performed.   IMPRESSION: This study is within normal limits. No seizures or epileptiform discharges were seen throughout the recording. [name]   EEG adult  Result Date: 08/22/2019 [name], MD     08/22/2019  3:21 PM ELECTROENCEPHALOGRAM REPORT Patient: Jaclyn [name]       Room #: 9E17E EEG No. ID: [id number] Age: 84 y.o.        Sex: female Requesting Physician: Florene Glen Report Date:  08/22/2019       Interpreting Physician: [name] History: Fanny Agan is an 84 y.o. female with altered mental status Medications: Eliquis, Lipitor, Insulin, Synthroid, Tradjenta Conditions of Recording:  This is a 21 channel routine scalp EEG performed with bipolar and monopolar montages arranged in accordance to the international 10/20 system of electrode placement. One channel was dedicated to EKG recording. The patient is in the awake and drowsy states. Description:  The patient is uncooperative and muscle and movement artifact are prominent during the recording.  When a waking background activity can be evaluated it consists of a low voltage, symmetrical, fairly well organized, 8 Hz alpha activity, seen from the parieto-occipital and posterior temporal regions.  Low voltage fast activity, poorly organized, is seen anteriorly and is at times superimposed on more posterior regions.  A mixture of theta and alpha rhythms are seen from the central and temporal regions. The patient drowses with slowing to irregular, low voltage theta and beta activity.  Stage II sleep is not obtained. No epileptiform activity is noted.  Hyperventilation and intermittent photic stimulation were not performed. IMPRESSION: Normal electroencephalogram, awake and drowsy. There are no focal lateralizing or epileptiform features. [name], MD Neurology [phone] 08/22/2019, 3:17 PM   ECHOCARDIOGRAM COMPLETE  Result Date: 08/22/2019    ECHOCARDIOGRAM REPORT   Patient Name:   Jaclyn [name] Date of Exam: 08/22/2019 Medical Rec #:  [id number]       Height:       62.0 in Accession #:    [id number]      Weight:       145.5 lb Date of Birth:  1935/11/16       BSA:          1.670 m Patient Age:    49 years        BP:           127/66 mmHg Patient Gender: F               HR:           70 bpm. Exam Location:   Inpatient Procedure: 2D Echo, Color Doppler and Cardiac Doppler Indications:    Stroke i163.9  History:        Patient has prior history  of Echocardiogram examinations, most                 recent 06/21/2017. Arrythmias:Atrial Fibrillation; Risk                 Factors:Hypertension, Diabetes and Dyslipidemia. COVID+ on                 01/15/19.  Sonographer:    [name] Senior RDCS Referring Phys: [id number] [place]  1. Left ventricular ejection fraction, by estimation, is 65 to 70%. The left ventricle has normal function. The left ventricle has no regional wall motion abnormalities. There is moderate left ventricular hypertrophy. Left ventricular diastolic parameters are indeterminate.  2. Right ventricular systolic function is normal. The right ventricular size is normal. Mildly increased right ventricular wall thickness. Tricuspid regurgitation signal is inadequate for assessing PA pressure.  3. The mitral valve is grossly normal, mild annular calcification. Trivial mitral valve regurgitation.  4. The aortic valve is tricuspid, mild annular calcification. Aortic valve regurgitation is not visualized.  5. The inferior vena cava is normal in size with greater than 50% respiratory variability, suggesting right atrial pressure of 3 mmHg. FINDINGS  Left Ventricle: Left ventricular ejection fraction, by estimation, is 65 to 70%. The left ventricle has normal function. The left ventricle has no regional wall motion abnormalities. The left ventricular internal cavity size was small. There is moderate  left ventricular hypertrophy. Left ventricular diastolic parameters are indeterminate. Right Ventricle: The right ventricular size is normal. Mildly increased right ventricular wall thickness. Right ventricular systolic function is normal. Tricuspid regurgitation signal is inadequate for assessing PA pressure. Left Atrium: Left atrial size was normal in size. Right Atrium: Right atrial size was normal in size.  Pericardium: Trivial pericardial effusion is present. The pericardial effusion is posterior to the left ventricle. Presence of pericardial fat pad. Mitral Valve: The mitral valve is grossly normal. Mild mitral annular calcification. Trivial mitral valve regurgitation. Tricuspid Valve: The tricuspid valve is grossly normal. Tricuspid valve regurgitation is trivial. Aortic Valve: The aortic valve is tricuspid. Aortic valve regurgitation is not visualized. Mild aortic valve annular calcification. Pulmonic Valve: The pulmonic valve was grossly normal. Pulmonic valve regurgitation is trivial. Aorta: The aortic root is normal in size and structure. Venous: The inferior vena cava is normal in size with greater than 50% respiratory variability, suggesting right atrial pressure of 3 mmHg. IAS/Shunts: No atrial level shunt detected by color flow Doppler.  LEFT VENTRICLE PLAX 2D LVIDd:         2.60 cm  Diastology LVIDs:         1.60 cm  LV e' lateral:   4.90 cm/s LV PW:         1.50 cm  LV E/e' lateral: 15.6 LV IVS:        1.30 cm  LV e' medial:    5.66 cm/s LVOT diam:     1.60 cm  LV E/e' medial:  13.5 LV SV:         36 LV SV Index:   22 LVOT Area:     2.01 cm  RIGHT VENTRICLE RV S prime:     12.00 cm/s TAPSE (M-mode): 1.7 cm LEFT ATRIUM             Index       RIGHT ATRIUM           Index LA diam:        1.70 cm 1.02 cm/m  RA Area:  10.30 cm LA Vol (A2C):   28.0 ml 16.77 ml/m RA Volume:   22.40 ml  13.41 ml/m LA Vol (A4C):   27.4 ml 16.41 ml/m LA Biplane Vol: 28.5 ml 17.07 ml/m  AORTIC VALVE LVOT Vmax:   76.30 cm/s LVOT Vmean:  54.100 cm/s LVOT VTI:    0.179 m  AORTA Ao Root diam: 3.10 cm Ao Asc diam:  3.00 cm MITRAL VALVE MV Area (PHT): 1.91 cm    SHUNTS MV Decel Time: 398 msec    Systemic VTI:  0.18 m MV E velocity: 76.40 cm/s  Systemic Diam: 1.60 cm MV A velocity: 90.00 cm/s MV E/A ratio:  0.85 Rozann Lesches MD Electronically signed by Rozann Lesches MD Signature Date/Time: 08/22/2019/11:43:40 AM    Final     VAS US CAROTID  Result Date: 08/24/2019 Carotid Arterial Duplex Study Indications:       CVA. Risk Factors:      Hypertension, hyperlipidemia, Diabetes. Comparison Study:  06-20-2017 Carotid duplex RT ICA 60-79%, LT ICA 1-39% Performing Technologist: Darlin Coco  Examination Guidelines: A complete evaluation includes B-mode imaging, spectral Doppler, color Doppler, and power Doppler as needed of all accessible portions of each vessel. Bilateral testing is considered an integral part of a complete examination. Limited examinations for reoccurring indications may be performed as noted.  Right Carotid Findings: +----------+--------+--------+--------+------------------+--------+           PSV cm/sEDV cm/sStenosisPlaque DescriptionComments +----------+--------+--------+--------+------------------+--------+ CCA Prox  97      13              heterogenous               +----------+--------+--------+--------+------------------+--------+ CCA Distal154     19              heterogenous               +----------+--------+--------+--------+------------------+--------+ ICA Prox  375     121     80-99%  calcific                   +----------+--------+--------+--------+------------------+--------+ ICA Mid   154     36                                         +----------+--------+--------+--------+------------------+--------+ ICA Distal64      18                                         +----------+--------+--------+--------+------------------+--------+ ECA       510             >50%                               +----------+--------+--------+--------+------------------+--------+ +----------+--------+-------+----------------+-------------------+           PSV cm/sEDV cmsDescribe        Arm Pressure (mmHG) +----------+--------+-------+----------------+-------------------+ Subclavian129            Multiphasic, WNL                     +----------+--------+-------+----------------+-------------------+ +---------+--------+--+--------+-+---------+ VertebralPSV cm/s34EDV cm/s9Antegrade +---------+--------+--+--------+-+---------+  Left Carotid Findings: +----------+--------+--------+--------+------------------+--------+           PSV cm/sEDV cm/sStenosisPlaque DescriptionComments +----------+--------+--------+--------+------------------+--------+ CCA Prox  121  14              heterogenous               +----------+--------+--------+--------+------------------+--------+ CCA Distal143     20              heterogenous               +----------+--------+--------+--------+------------------+--------+ ICA Prox  133     48      40-59%  calcific                   +----------+--------+--------+--------+------------------+--------+ ICA Mid   119     28                                         +----------+--------+--------+--------+------------------+--------+ ICA Distal74      27                                         +----------+--------+--------+--------+------------------+--------+ ECA       188                                                +----------+--------+--------+--------+------------------+--------+ +----------+--------+--------+----------------+-------------------+           PSV cm/sEDV cm/sDescribe        Arm Pressure (mmHG) +----------+--------+--------+----------------+-------------------+ [id number]              Multiphasic, WNL                    +----------+--------+--------+----------------+-------------------+ +---------+--------+--+--------+-+---------+ VertebralPSV cm/s47EDV cm/s6Antegrade +---------+--------+--+--------+-+---------+   Summary: Right Carotid: Velocities in the right ICA are consistent with a 80-99%                stenosis. Non-hemodynamically significant plaque <50% noted in                the CCA. The ECA appears >50% stenosed. Left  Carotid: Velocities in the left ICA are consistent with a 40-59% stenosis.               Non-hemodynamically significant plaque <50% noted in the CCA. Vertebrals:  Bilateral vertebral arteries demonstrate antegrade flow. Subclavians: Normal flow hemodynamics were seen in bilateral subclavian              arteries. *See table(s) above for measurements and observations.  Electronically signed by Antony Contras MD on 08/24/2019 at 12:53:57 PM.    Final    CT ANGIO HEAD CODE STROKE  Result Date: 08/22/2019 CLINICAL DATA:  84 year old female with a small acute right superior frontal white matter lacunar infarct after presenting with arm weakness and confusion. EXAM: CT ANGIOGRAPHY HEAD AND NECK TECHNIQUE: Multidetector CT imaging of the head and neck was performed using the standard protocol during bolus administration of intravenous contrast. Multiplanar CT image reconstructions and MIPs were obtained to evaluate the vascular anatomy. Carotid stenosis measurements (when applicable) are obtained utilizing NASCET criteria, using the distal internal carotid diameter as the denominator. CONTRAST:  93mL OMNIPAQUE IOHEXOL 350 MG/ML SOLN COMPARISON:  Brain MRI and plain head CT 08/21/2019. CTA head and neck and CTP 06/20/2017. FINDINGS: CTA NECK Skeleton: Carious dentition. Chronic cervical spine  degeneration. No acute osseous abnormality identified. Upper chest: Chronic apical lung scarring is stable. No superior mediastinal lymphadenopathy. Unchanged borderline to mild circumferential wall thickening of the upper thoracic esophagus. Other neck: Negative. Aortic arch: 3 vessel arch configuration. Stable mild distal arch calcified plaque. Right carotid system: Negative brachiocephalic artery and proximal right CCA. Mild plaque proximal to the bifurcation without stenosis. Bulky chronic mostly calcified plaque at the right ICA origin and bulb resulting in radiographic string sign stenosis (series 5, image 94) over the initial 10  mm segment of the vessel. This appears mildly progressed. The right ICA remains patent and is otherwise negative to the skull base. Left carotid system: Minimal plaque at the left CCA origin without stenosis. Mild plaque proximal to the bifurcation without stenosis. Chronic bulky calcified plaque at the left ICA origin resulting in stenosis numerically estimated at 70% with respect to the distal vessel (series 9, image 126). This is stable. Left ICA is tortuous and remains patent distal to the stenosis. Vertebral arteries: Calcified proximal right subclavian artery plaque without stenosis. Dominant right vertebral artery is mildly ectatic without plaque or stenosis to the skull base. Moderate calcified plaque in the proximal left subclavian artery with up to 50% stenosis appears stable. Mild calcified plaque at the non dominant left vertebral artery origin not resulting in stenosis. The left vertebral remains non dominant to the skull base without stenosis. CTA HEAD Posterior circulation: Dominant right vertebral artery primarily supplies the basilar with minimal calcified plaque and no stenosis. Normal right PICA origin. Left V4 segment is non dominant with new severe stenosis or short segment occlusion just proximal to the vertebrobasilar junction on series 11, image 20. This is about 3 mm in length. The basilar artery is patent but newly diminutive since 2019 with multifocal irregularity. Only mild focal basilar artery stenosis results. The SCA and PCA origins remain patent. Chronic bilateral PCA atherosclerosis has progressed now with severe bilateral P1 and P2 segment stenoses (series 11, images 17 and 18). Despite this there is distal bilateral PCA enhancement. Anterior circulation: Both ICA siphons are patent but heavily calcified. On the left there is severe supraclinoid stenosis on series 8, image 63, mildly progressed since 2019. On the right there is moderate to severe cavernous and proximal supraclinoid  stenosis due to bulky calcified plaque which appears stable. Carotid termini remain patent. Mild-to-moderate bilateral chronic proximal MCA stenosis has not significantly changed. A1 segments remain normal. Mild bilateral ACA branch irregularity is stable. Distal right M1 stenosis is moderate and increased (series 10, image 16). Left MCA bifurcation remains patent, with stable left MCA branches demonstrating multifocal mild irregularity. Right MCA bifurcation remains patent with stable right MCA branches demonstrating multifocal mild irregularity. Venous sinuses: Patent. Anatomic variants: Dominant right vertebral artery. Review of the MIP images confirms the above findings IMPRESSION: 1. Negative for large vessel occlusion. 2. But positive for chronic severe atherosclerosis which has progressed since a 2019 CTA as follows: - New Severe stenosis or Short Segment Occlusion of the distal Left Vertebral Artery V4 segment. - New diminutive caliber of the Basilar Artery which remains patent. - RADIOGRAPHIC STRING SIGN stenosis of the proximal Right ICA has progressed. Chronic moderate to severe Left ICA siphon stenosis is stable. - progressed Left ICA supraclinoid stenosis which is now moderate to severe. Stable proximal left Left 70% stenosis. - progressed and Severe bilateral PCA P1/P2 stenoses. - progressed and moderate Right MCA distal M1 stenosis. 3. Stable moderate bilateral proximal MCA stenoses. Stable proximal left  subclavian artery plaque with 50% stenosis. Electronically Signed   By: Genevie Ann M.D.   On: 08/22/2019 11:59   CT ANGIO NECK CODE STROKE  Result Date: 08/22/2019 CLINICAL DATA:  84 year old female with a small acute right superior frontal white matter lacunar infarct after presenting with arm weakness and confusion. EXAM: CT ANGIOGRAPHY HEAD AND NECK TECHNIQUE: Multidetector CT imaging of the head and neck was performed using the standard protocol during bolus administration of intravenous contrast.  Multiplanar CT image reconstructions and MIPs were obtained to evaluate the vascular anatomy. Carotid stenosis measurements (when applicable) are obtained utilizing NASCET criteria, using the distal internal carotid diameter as the denominator. CONTRAST:  19mL OMNIPAQUE IOHEXOL 350 MG/ML SOLN COMPARISON:  Brain MRI and plain head CT 08/21/2019. CTA head and neck and CTP 06/20/2017. FINDINGS: CTA NECK Skeleton: Carious dentition. Chronic cervical spine degeneration. No acute osseous abnormality identified. Upper chest: Chronic apical lung scarring is stable. No superior mediastinal lymphadenopathy. Unchanged borderline to mild circumferential wall thickening of the upper thoracic esophagus. Other neck: Negative. Aortic arch: 3 vessel arch configuration. Stable mild distal arch calcified plaque. Right carotid system: Negative brachiocephalic artery and proximal right CCA. Mild plaque proximal to the bifurcation without stenosis. Bulky chronic mostly calcified plaque at the right ICA origin and bulb resulting in radiographic string sign stenosis (series 5, image 94) over the initial 10 mm segment of the vessel. This appears mildly progressed. The right ICA remains patent and is otherwise negative to the skull base. Left carotid system: Minimal plaque at the left CCA origin without stenosis. Mild plaque proximal to the bifurcation without stenosis. Chronic bulky calcified plaque at the left ICA origin resulting in stenosis numerically estimated at 70% with respect to the distal vessel (series 9, image 126). This is stable. Left ICA is tortuous and remains patent distal to the stenosis. Vertebral arteries: Calcified proximal right subclavian artery plaque without stenosis. Dominant right vertebral artery is mildly ectatic without plaque or stenosis to the skull base. Moderate calcified plaque in the proximal left subclavian artery with up to 50% stenosis appears stable. Mild calcified plaque at the non dominant left  vertebral artery origin not resulting in stenosis. The left vertebral remains non dominant to the skull base without stenosis. CTA HEAD Posterior circulation: Dominant right vertebral artery primarily supplies the basilar with minimal calcified plaque and no stenosis. Normal right PICA origin. Left V4 segment is non dominant with new severe stenosis or short segment occlusion just proximal to the vertebrobasilar junction on series 11, image 20. This is about 3 mm in length. The basilar artery is patent but newly diminutive since 2019 with multifocal irregularity. Only mild focal basilar artery stenosis results. The SCA and PCA origins remain patent. Chronic bilateral PCA atherosclerosis has progressed now with severe bilateral P1 and P2 segment stenoses (series 11, images 17 and 18). Despite this there is distal bilateral PCA enhancement. Anterior circulation: Both ICA siphons are patent but heavily calcified. On the left there is severe supraclinoid stenosis on series 8, image 63, mildly progressed since 2019. On the right there is moderate to severe cavernous and proximal supraclinoid stenosis due to bulky calcified plaque which appears stable. Carotid termini remain patent. Mild-to-moderate bilateral chronic proximal MCA stenosis has not significantly changed. A1 segments remain normal. Mild bilateral ACA branch irregularity is stable. Distal right M1 stenosis is moderate and increased (series 10, image 16). Left MCA bifurcation remains patent, with stable left MCA branches demonstrating multifocal mild irregularity. Right MCA bifurcation  remains patent with stable right MCA branches demonstrating multifocal mild irregularity. Venous sinuses: Patent. Anatomic variants: Dominant right vertebral artery. Review of the MIP images confirms the above findings IMPRESSION: 1. Negative for large vessel occlusion. 2. But positive for chronic severe atherosclerosis which has progressed since a 2019 CTA as follows: - New  Severe stenosis or Short Segment Occlusion of the distal Left Vertebral Artery V4 segment. - New diminutive caliber of the Basilar Artery which remains patent. - RADIOGRAPHIC STRING SIGN stenosis of the proximal Right ICA has progressed. Chronic moderate to severe Left ICA siphon stenosis is stable. - progressed Left ICA supraclinoid stenosis which is now moderate to severe. Stable proximal left Left 70% stenosis. - progressed and Severe bilateral PCA P1/P2 stenoses. - progressed and moderate Right MCA distal M1 stenosis. 3. Stable moderate bilateral proximal MCA stenoses. Stable proximal left subclavian artery plaque with 50% stenosis. Electronically Signed   By: Genevie Ann M.D.   On: 08/22/2019 11:59    Labs:  Basic Metabolic Panel: Recent Labs  Lab 09/05/19 1055  NA 137  K 4.2  CL 102  CO2 24  GLUCOSE 260*  BUN 11  CREATININE 0.97  CALCIUM 9.2    CBC: Recent Labs  Lab 09/05/19 0530  WBC 5.7  HGB 10.6*  HCT 32.5*  MCV 91.0  PLT 290    CBG: Recent Labs  Lab 09/08/19 0610 09/08/19 1144 09/08/19 1625 09/08/19 1709 09/08/19 2025  GLUCAP 185* 272* 405* 319* 282*   Family history.  Father lymphoma hypertension CVA hyperlipidemia.  Mother with hypertension.  Sister with breast cancer.  Denies any colon cancer rectal cancer or esophageal cancer  Brief HPI:   Beatric Fulop is a 84 y.o. 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  Patient lives with son 1 level home.  Independent with assistive device.  Cares for herself only needing limited assist for dressing.  She does not drive.  Presented 08/21/2019 left-sided weakness altered mental status.  She was found down by her son.  Cranial CT scan unremarkable for acute process.  Patient did not receive TPA.  MRI of the brain showed a 6 mm acute ischemic nonhemorrhagic  periventricular white matter infarction involving the posterior right frontal corona radiata centrum semiovale.  CT angiogram of head and neck negative for large vessel occlusion.  Echocardiogram with ejection fraction of 70% no wall motion abnormalities.  EEG x2 -.  Admission chemistry sodium 132 ammonia level 26 glucose 360 hemoglobin 11.4 urinalysis negative hemoglobin A1c 14.2.  Hospital course further complicated by right ICA stenosis patient underwent right carotid surgery with Dacron patch angioplasty 08/26/2019 per Dr. Donnetta Hutching complicated postoperatively by hypotension requiring dopamine for 24 hours.  Neurology follow-up Eliquis resumed with low-dose aspirin.  Tolerating a regular diet.  Patient was admitted for a comprehensive rehab program.   Hospital Course: Arlone Lenhardt was admitted to rehab 08/31/2019 for inpatient therapies to consist of PT, ST and OT at least three hours five days a week. Past admission physiatrist, therapy team and rehab RN have worked together to provide customized collaborative inpatient rehab.  Pertaining to patient's right MCA small subcortical infarction in the setting of severe intracranial large vessel disease high-grade right ICA stenosis.  She had undergone right CEA and would follow-up vascular surgery.  Eliquis and low-dose aspirin ongoing for CVA prophylaxis with history of atrial fibrillation.  She would follow neurology services.  Pain managed with use of oxycodone as needed.  Mood stabilization with Cymbalta.  Bouts of orthostasis improved and she did remain on low-dose ProAmatine.  Blood sugars monitored hemoglobin A1c 14.2 insulin therapy as well as Tradjenta as advised with diabetic teaching.  Lipitor for hyperlipidemia.  She did have a history of recurrent UTIs on tripex.  Denied any dysuria hematuria.   Blood pressures were monitored on TID basis and controlled  Diabetes has been monitored with ac/hs CBG checks and SSI was use prn for tighter BS control.     Rehab course: During patient's stay in rehab weekly team conferences were held to monitor patient's progress, set goals and discuss barriers to discharge. At admission, patient required.  Min mod assist 160 feet rolling walker minimal assist stand pivot transfers minimal assist sit to side-lying.  Minimal assist lower body dressing minimal assist toilet transfers moderate assist toileting  Physical exam.  Blood pressure 130/70 pulse 78 temperature 98.2 respiration 17 oxygen saturation 90% room air HEENT Head.  Normocephalic and atraumatic.  Poor dentition Eyes.  Pupils round and reactive to light no discharge without nystagmus Neck.  Supple nontender no JVD without thyromegaly Cardiac rate controlled without gallop Respiratory effort normal no respiratory distress without wheeze Abdomen.  Soft nontender positive bowel sounds without rebound Musculoskeletal no edema or tenderness extremities Neurological patient alert no acute distress severely hard of hearing follows commands strength grossly graded 5/5 throughout   /She  has had improvement in activity tolerance, balance, postural control as well as ability to compensate for deficits. Marlana Salvage has had improvement in functional use RUE/LUE  and RLE/LLE as well as improvement in awareness.  Working with energy conservation techniques.  Patient performed bed mobility mostly from flat bed supervision use a rolling walker to ambulate to the dresser contact-guard assist.  Perform functional reaching tasks opening drawers to look for pants.  Ambulates to the closet performing additional reaching task all performed with close supervision.  She got his belongings for activities day living and homemaking.  Full family teaching completed and plan discharged home       Disposition: Discharged to home    Diet: Diabetic diet  Special Instructions: No driving smoking or alcohol  Medications at discharge 1.  Tylenol as needed 2.  Eliquis 5 mg p.o.  twice daily 3.  Aspirin 81 mg p.o. daily 4.  Lipitor 40 mg p.o. daily 5.  Colace 100 mg p.o. daily 6.  Cymbalta 60 mg p.o. daily 7.  Levemir 40 units twice daily 8.  Synthroid 112 mcg p.o. daily 9.  Tradjenta 5 mg p.o. daily 10.  ProAmatine 5 mg p.o. twice daily 11.  Oxycodone 5 to 10 mg every 4 hours as needed pain 12.  Protonix 40 mg p.o. daily 13.Trimpex 100 mg p.o. daily 14.  Vitamin B12 1000 mcg p.o. daily  30-35 minutes were spent completing discharge summary and discharge planning  Discharge Instructions    Ambulatory referral to Neurology   Complete by: As directed    An appointment is requested in approximately 4 weeks right MCA infarction   Ambulatory referral to Physical Medicine Rehab   Complete by: As directed    Moderate complexity follow-up 1 to 2 weeks right MCA infarction       Follow-up Information    Kirsteins, Luanna Salk, MD Follow up.   Specialty: Physical Medicine and Rehabilitation Why: Office to call for appointment Contact information: [place] [address] [phone]  Rosetta Posner, MD Follow up.   Specialties: Vascular Surgery, Cardiology Why: Call for appointment Contact information: [address] [place] [address] [phone]               Signed: Lavon Paganini Lawtey 09/09/2019, 5:12 AM

## 2019-09-08 ENCOUNTER — Inpatient Hospital Stay (HOSPITAL_COMMUNITY): Payer: Medicare Other | Admitting: Speech Pathology

## 2019-09-08 ENCOUNTER — Inpatient Hospital Stay (HOSPITAL_COMMUNITY): Payer: Medicare Other | Admitting: Physical Therapy

## 2019-09-08 ENCOUNTER — Inpatient Hospital Stay (HOSPITAL_COMMUNITY): Payer: Medicare Other | Admitting: Occupational Therapy

## 2019-09-08 LAB — GLUCOSE, CAPILLARY
Glucose-Capillary: 185 mg/dL — ABNORMAL HIGH (ref 70–99)
Glucose-Capillary: 272 mg/dL — ABNORMAL HIGH (ref 70–99)
Glucose-Capillary: 405 mg/dL — ABNORMAL HIGH (ref 70–99)
Glucose-Capillary: 319 mg/dL — ABNORMAL HIGH (ref 70–99)
Glucose-Capillary: 282 mg/dL — ABNORMAL HIGH (ref 70–99)

## 2019-09-08 MED ORDER — DOCUSATE SODIUM 100 MG PO CAPS: 100.0000 mg | ORAL_CAPSULE | Freq: Every day | ORAL | 0 refills | Status: DC

## 2019-09-08 MED ORDER — DULOXETINE HCL 60 MG PO CPEP: 60.0000 mg | ORAL_CAPSULE | Freq: Every day | ORAL | 3 refills | Status: DC

## 2019-09-08 MED ORDER — LEVOTHYROXINE SODIUM 112 MCG PO TABS: 112.0000 ug | ORAL_TABLET | Freq: Every day | ORAL | 3 refills | Status: DC

## 2019-09-08 MED ORDER — APIXABAN 5 MG PO TABS: 5.0000 mg | ORAL_TABLET | Freq: Two times a day (BID) | ORAL | 1 refills | Status: DC

## 2019-09-08 MED ORDER — ATORVASTATIN CALCIUM 40 MG PO TABS: 40.0000 mg | ORAL_TABLET | Freq: Every day | ORAL | 0 refills | Status: DC

## 2019-09-08 NOTE — Progress Notes (Signed)
[facility] PHYSICAL MEDICINE & REHABILITATION PROGRESS NOTE   Subjective/Complaints: Pt aware of d/c date, happy about it  ROS: Patient denies CP, SOB, N/V/D Objective:   No results found. No results for input(s): WBC, HGB, HCT, PLT in the last 72 hours. Recent Labs    09/05/19 1055  NA 137  K 4.2  CL 102  CO2 24  GLUCOSE 260*  BUN 11  CREATININE 0.97  CALCIUM 9.2    Intake/Output Summary (Last 24 hours) at 09/08/2019 0814 Last data filed at 09/07/2019 1700 Gross per 24 hour  Intake 480 ml  Output --  Net 480 ml     Physical Exam: Vital Signs Blood pressure 122/75, pulse 81, temperature 98.2 F (36.8 C), resp. rate 16, height 5\' 2"  (1.575 m), weight 64.9 kg, SpO2 94 %.   General: No acute distress Mood and affect are appropriate Heart: Regular rate and rhythm no rubs murmurs or extra sounds Lungs: Clear to auscultation, breathing unlabored, no rales or wheezes Abdomen: Positive bowel sounds, soft nontender to palpation, nondistended Extremities: No clubbing, cyanosis, or edema Skin: No evidence of breakdown, no evidence of rash Neurologic: Cranial nerves II through XII intact, motor strength is 5/5 in bilateral deltoid, bicep, tricep, grip, hip flexor, knee extensors, ankle dorsiflexor and plantar flexor Sensory exam normal sensation to light touch and proprioception in bilateral upper and lower extremities Cerebellar exam normal finger to nose to finger as well as heel to shin in bilateral upper and lower extremities Musculoskeletal: Full range of motion in all 4 extremities. No joint swelling  Assessment/Plan: 1. Functional deficits secondary to RIght MCA infarct  which require 3+ hours per day of interdisciplinary therapy in a comprehensive inpatient rehab setting.  Physiatrist is providing close team supervision and 24 hour management of active medical problems listed below.  Physiatrist and rehab team continue to assess barriers to discharge/monitor patient  progress toward functional and medical goals  Care Tool:  Bathing  Bathing activity did not occur: Refused           Bathing assist       Upper Body Dressing/Undressing Upper body dressing   What is the patient wearing?: Pull over shirt    Upper body assist Assist Level: Minimal Assistance - Patient > 75%    Lower Body Dressing/Undressing Lower body dressing      What is the patient wearing?: Incontinence brief, Pants     Lower body assist Assist for lower body dressing: Moderate Assistance - Patient 50 - 74%     Toileting Toileting Toileting Activity did not occur Landscape architect and hygiene only): N/A (no void or bm)  Toileting assist Assist for toileting: Supervision/Verbal cueing     Transfers Chair/bed transfer  Transfers assist     Chair/bed transfer assist level: Supervision/Verbal cueing Chair/bed transfer assistive device: Programmer, multimedia   Ambulation assist      Assist level: Supervision/Verbal cueing Assistive device: Walker-rolling Max distance: 116ft   Walk 10 feet activity   Assist     Assist level: Supervision/Verbal cueing Assistive device: Walker-rolling   Walk 50 feet activity   Assist    Assist level: Supervision/Verbal cueing Assistive device: Walker-rolling    Walk 150 feet activity   Assist    Assist level: Contact Guard/Touching assist Assistive device: Walker-rolling    Walk 10 feet on uneven surface  activity   Assist Walk 10 feet on uneven surfaces activity did not occur: Safety/medical concerns   Assist level: Contact  Guard/Touching assist Assistive device: Aeronautical engineer Will patient use wheelchair at discharge?: Yes (long terms goals per PT) Type of Wheelchair: Manual    Wheelchair assist level: Minimal Assistance - Patient > 75% Max wheelchair distance: 150    Wheelchair 50 feet with 2 turns activity    Assist        Assist  Level: Minimal Assistance - Patient > 75%   Wheelchair 150 feet activity     Assist      Assist Level: Maximal Assistance - Patient 25 - 49%   Blood pressure 122/75, pulse 81, temperature 98.2 F (36.8 C), resp. rate 16, height 5\' 2"  (1.575 m), weight 64.9 kg, SpO2 94 %.  Medical Problem List and Plan: 1.  Left side weakness secondary to right MCA small subcortical infarct in the setting of severe intracranial large vessel disease with high-grade right ICA stenosis.  Status post right CEA  Continue CIR PT, OT, SLP, plan d/c in am  2.  Antithrombotics: -DVT/anticoagulation: Eliquis             -antiplatelet therapy: Aspirin 81 mg daily 3. Pain Management: Oxycodone as needed 4. Mood: Cymbalta 60 mg daily             -antipsychotic agents: N/A 5. Neuropsych: This patient is ?fully capable of making decisions on her own behalf. 6. Skin/Wound Care: Routine skin checks 7. Fluids/Electrolytes/Nutrition: Routine in and outs.  8.  Orthostasis after CEA.    ProAmatine 2.5 mg 3 times daily, decreased to BID on 7/16 with breakfast and lunch  Orthostatic positive on 7/16,  Vitals:   09/07/19 1947 09/08/19 0525  BP: (!) 153/60 122/75  Pulse: 85 81  Resp: 16 16  Temp: 98 F (36.7 C) 98.2 F (36.8 C)  SpO2: 98% 94%  orthostatic HTN improved will reduce proamatine dose to 5mg  BID              TEDs, binder, acclimation 9.    Uncontrolled diabetes mellitus with hyperglycemia.  Hemoglobin A1c 14.2.  NovoLog 6 units 3 times daily, Levemir 40 units twice daily, Tradjenta 5 mg daily.  Provide diabetic teaching          CBG (last 3)  Recent Labs    09/07/19 1635 09/07/19 2114 09/08/19 0610  GLUCAP 272* 325* 185*   Add bedtime snack to avoid am hypoglycemia  Still elevated in am will resume levimir 40U BID - poor compliance with diet,per RN report family brings in concentrated sweets and snacks 10.  Hypothyroidism. Continue Synthroid  11.  Hyperlipidemia.  Lipitor 12.  Recurrent UTIs.  Tripmex 100 mg daily  13.  Hypokalemia   Resolved K+ 4.1 on 7/19  Supplementing with 79meq daily  14.  Hypertension   See #8 LOS: 8 days A FACE TO FACE EVALUATION WAS PERFORMED  Charlett Blake 09/08/2019, 8:14 AM

## 2019-09-08 NOTE — Progress Notes (Signed)
Occupational Therapy Discharge Summary  Patient Details  Name: Jaclyn [name] MRN: [id number] Date of Birth: [birth year]  Today's Date: 09/08/2019 OT Individual Time: 7989-2119 OT Individual Time Calculation (min): 58 min    Patient has met 14 of 14 long term goals due to improved activity tolerance, improved balance, postural control, ability to compensate for deficits, improved attention, improved awareness and improved coordination.  Patient to discharge at overall mod I - supervision level.  Family is aware of recommendation for supervision secondary to safety awareness and cognition.  Reasons goals not met: all goals met  Recommendation:  Patient will benefit from ongoing skilled OT services in home health setting to continue to advance functional skills in the area of BADL and iADL.  Equipment: No equipment provided  Reasons for discharge: treatment goals met  Patient/family agrees with progress made and goals achieved: Yes   OT intervention: Upon entering the room, pt seated on EOB and starting breakfast. Pt is agreeable to shower this session. OT reviewing pt's goals while she finishes eating food. Pt performed bed mobility and sit <>stand independently this session. Pt ambulating with RW and supervision into bathroom for toileting needs. Pt performed toilet transfer, hygiene, and clothing management with supervision overall. Pt provided with min cuing to doff clothing items while seated on toilet for safety awareness and then transitioned to sitting on shower chair for bathing with close supervision. Pt remained seated while bathing except when standing to wash buttocks and peri area with use of grab bar and close supervision. Pt drying self and exiting the shower to sit on EOB for dressing tasks. Pt donning clothing with sit <>stand at mod I level. Pt remained in bed at end of session with bed alarm activated and call bell within reach.   OT Discharge Precautions/Restrictions   Precautions Precautions: Fall Precaution Comments: left inattention (improving) Restrictions Weight Bearing Restrictions: No Pain Pain Assessment Pain Scale: 0-10 Pain Score: 0-No pain ADL ADL Eating: Set up Where Assessed-Eating: Bed level Grooming: Contact guard Where Assessed-Grooming: Standing at sink Upper Body Dressing: Minimal assistance Where Assessed-Upper Body Dressing: Edge of bed Lower Body Dressing: Moderate assistance Where Assessed-Lower Body Dressing: Edge of bed Toileting: Minimal assistance Where Assessed-Toileting: Glass blower/designer: Psychiatric nurse Method: Counselling psychologist: Grab bars ADL Comments: patient declined bathing this session Vision Baseline Vision/History: Cataracts Patient Visual Report: No change from baseline Cognition Overall Cognitive Status: No family/caregiver present to determine baseline cognitive functioning Arousal/Alertness: Awake/alert Orientation Level: Oriented X4 Attention: Sustained Sustained Attention: Impaired Memory: Impaired Memory Impairment: Retrieval deficit;Decreased short term memory;Other (comment) (reduced working memory) Decreased Short Term Memory: Verbal basic;Functional basic Awareness: Impaired Awareness Impairment: Intellectual impairment Problem Solving: Impaired Problem Solving Impairment: Verbal basic;Functional basic Executive Function: Organizing;Decision Making;Reasoning Reasoning: Impaired Reasoning Impairment: Verbal basic;Functional basic Organizing: Impaired Organizing Impairment: Functional basic Decision Making: Impaired Safety/Judgment: Appears intact Sensation Sensation Light Touch: Appears Intact Hot/Cold: Appears Intact Proprioception: Appears Intact Stereognosis: Not tested Coordination Gross Motor Movements are Fluid and Coordinated: Yes Fine Motor Movements are Fluid and Coordinated: Yes Motor  Motor Motor - Discharge Observations:  mild L coordination deficits improved since initial evaluation Mobility  Bed Mobility Bed Mobility: Supine to Sit;Sit to Supine Supine to Sit: Independent with assistive device Sit to Supine: Independent with assistive device Transfers Sit to Stand: Supervision/Verbal cueing Stand to Sit: Supervision/Verbal cueing  Trunk/Postural Assessment  Cervical Assessment Cervical Assessment: Within Functional Limits Thoracic Assessment Thoracic Assessment: Within Functional Limits Lumbar Assessment Lumbar Assessment: Within Functional  Limits  Balance Balance Balance Assessed: Yes Static Sitting Balance Static Sitting - Level of Assistance: 7: Independent Dynamic Sitting Balance Dynamic Sitting - Balance Support: No upper extremity supported Dynamic Sitting - Level of Assistance: 6: Modified independent (Device/Increase time) Static Standing Balance Static Standing - Balance Support: During functional activity Static Standing - Level of Assistance: 5: Stand by assistance Dynamic Standing Balance Dynamic Standing - Balance Support: During functional activity Dynamic Standing - Level of Assistance: 5: Stand by assistance Extremity/Trunk Assessment RUE Assessment RUE Assessment: Within Functional Limits LUE Assessment LUE Assessment: Within Functional Limits Passive Range of Motion (PROM) Comments: WFL Active Range of Motion (AROM) Comments: WFLs General Strength Comments: 4/5 overall   Luverne Farone P 09/08/2019, 10:21 AM

## 2019-09-08 NOTE — Progress Notes (Addendum)
Patient ID: Jaclyn [name], female   DOB: 03/25/1935, 84 y.o.   MRN: [id number]   Sw received call from patient gdtr [name]): will be picking patient up tomorrow between 10AM-12PM

## 2019-09-08 NOTE — Progress Notes (Addendum)
Patient ID: Jaclyn [name], female   DOB: 11-Jul-1935, 84 y.o.   MRN: [id number]   Sw called patient son and granddaughter again today to inform of approved discharge date of this upcoming Friday and team conference updates.  Son (shawn) voicemail box is full. Left grand daughter [name]) another voicemail.

## 2019-09-08 NOTE — Progress Notes (Signed)
Speech Language Pathology Discharge Summary  Patient Details  Name: Jaclyn [name] MRN: [id number] Date of Birth: 02-Aug-1935  Today's Date: 09/08/2019 SLP Individual Time: 1415-1445 SLP Individual Time Calculation (min): 30 min   Skilled Therapeutic Interventions:  Pt was seen for skilled ST targeting cognition. Pt finished monthly calendar/scheduling task which she started in previous session with Supervision A for recall and problem solving to place basic straight forward appointments on a calendar. Those with more complex language required Min A question cue from SLP to process and problem solve how to list on calendar appropriately. SLP also provided verbal review and handout for compensatory memory strategies for use at home. Discussed recommendations for 24/7 supervision and help with meds, finances, and cooking. Pt in agreement, and states she already asked son to assist with administering her insulin shots, due to decreased vision and problem solving skills. Pt left laying in bed with alarm set and needs within reach. Continue per current plan of care.      Patient has met 5 of 5 long term goals.  Patient to discharge at overall Supervision level.  Reasons goals not met: n/a   Clinical Impression/Discharge Summary:   Pt made functional gains and met 5 out of 5 long term goals this admission. Pt currently requires Supervision assist for basic to mildly complex familiar cognitive tasks due to impairments impacting her problem solving, intellectual and emergent awareness, and short term memory. It is recommended that she have 24/7 supervision at home for greatest safety. Pt has demonstrated improved basic problem solving, awareness of deficits, and use of strategies to compensate for reduced short term and working memory. Also of note, pt is very hearing impaired, which exacerbates cognitive deficits and impacts her functional independence without use of hearing aid; use of voice amplifier with  headphones used during ST sessions in CIR, which was very helpful; would recommend pt see audiologist for hearing loss and hearing aid evaluations when able. Furthermore, given cognitive deficits still present, recommend pt continue to receive skilled ST services upon discharge. Pt education is complete at this time, however no family was present for education session.   Care Partner:  Caregiver Able to Provide Assistance: Yes  Type of Caregiver Assistance: Cognitive  Recommendation:  Home Health SLP;24 hour supervision/assistance  Rationale for SLP Follow Up: Maximize cognitive function and independence;Reduce caregiver burden   Equipment: none   Reasons for discharge: Discharged from hospital   Patient/Family Agrees with Progress Made and Goals Achieved: Yes    Arbutus Leas 09/08/2019, 7:18 AM

## 2019-09-08 NOTE — Progress Notes (Signed)
Physical Therapy Discharge Summary  Patient Details  Name: Jaclyn [name] MRN: [id number] Date of Birth: 1935-05-05  Today's Date: 09/08/2019 PT Individual Time: 1300-1400 PT Individual Time Calculation (min): 60 min    Patient has met 9 of 10 long term goals due to improvement in sitting and standing balance, improved endurance, increased safety awareness, increased distance and stability with gait training, and overall level of independence of bed mobility and transfers. Only goal not was transfers which was for mod I and pt is currently supervision 2/2 cognition and safety awareness. Patient to discharge at an ambulatory level Supervision.   Patient's care partner is independent to provide the necessary physical assistance at discharge.  Reasons goals not met: cognition and safety awareness. Pt verbalized understanding that therapy is recommending supervision at home from family.  Recommendation:  Patient will benefit from ongoing skilled PT services in home health setting to continue to advance safe functional mobility, address ongoing impairments in standing balance, gait training, home setup for safety and minimize fall risk.  Equipment: No equipment provided. Pt reports she has rolling walker for use at home  Reasons for discharge: discharge from hospital  Patient/family agrees with progress made and goals achieved: Yes  pt received in bed and agreeable to therapy. Pt transferred from supine>sit mod I, sat EOB statically without assistance, transferred from sitting to [facility] at supervision with RW. Pt taken to gym with [facility] for time management. Pt directed in car transfer to/from [facility] with extra time to complete and only VC for technique to sit on seat then turn into front of seat. Pt directed in TUG test with RW with time of 46s which is indicative of high fall risk though improved from evaluation time. BERG test score of 33/56 which is indicative of greater fall risk and this is an  improved score compared to evaluation score. Pt also directed in x5 STS from [facility] to RW at supervision, gait training with RW at supervision for 150' and additional 100' post seated rest break with VC for walker proximity, task attention, and inc stride length. Pt directed in transfer from standing to sitting at EOB at supervision, sit>supine at mod I. Pt left in bed, alarm set, All needs in reach and in good condition. Call light in hand. Pt and PT discussed safety awareness once home and pt agreeable to all therapy recommendations of using rolling walker for all mobility, conserving energy for decreased fall risk, having supervision, removal of tripping hazards.    PT Discharge Precautions/Restrictions Precautions Precautions: Fall Precaution Comments: distractible Restrictions Weight Bearing Restrictions: No Vital Signs   Pain Pain Assessment Pain Scale: 0-10 Pain Score: 0-No pain Vision/Perception     Cognition Arousal/Alertness: Awake/alert Orientation Level: Oriented X4 Attention: Sustained Sensation Sensation Light Touch: Appears Intact Hot/Cold: Appears Intact Proprioception: Appears Intact Stereognosis: Not tested Coordination Gross Motor Movements are Fluid and Coordinated: Yes Fine Motor Movements are Fluid and Coordinated: Yes Motor  Motor Motor - Discharge Observations: mild L coordination deficits improved since initial evaluation  Mobility Bed Mobility Bed Mobility: Supine to Sit;Sit to Supine Supine to Sit: Independent with assistive device Sit to Supine: Independent with assistive device Transfers Transfers: Sit to Stand;Stand Pivot Transfers Sit to Stand: Supervision/Verbal cueing Stand to Sit: Supervision/Verbal cueing Stand Pivot Transfers: Supervision/Verbal cueing Stand Pivot Transfer Details: Verbal cues for precautions/safety Transfer (Assistive device): Rolling walker Locomotion  Gait Ambulation: Yes Gait Assistance: Supervision/Verbal  cueing Assistive device: Rolling walker Gait Gait: Yes Gait Pattern: Impaired Gait  Pattern: Trunk flexed Gait velocity: pt very distracted throughout and decreased cadence Stairs / Additional Locomotion Stairs: Yes Stairs Assistance: Minimal Assistance - Patient > 75% Stair Management Technique: Other (comment) (Hand held assist) Number of Stairs: 1 Height of Stairs: 6 Wheelchair Mobility Wheelchair Mobility: No  Trunk/Postural Assessment  Cervical Assessment Cervical Assessment: Within Functional Limits Thoracic Assessment Thoracic Assessment: Within Functional Limits Lumbar Assessment Lumbar Assessment: Within Functional Limits  Balance Balance Balance Assessed: Yes Standardized Balance Assessment Standardized Balance Assessment: Berg Balance Test;Timed Up and Go Test Berg Balance Test Sit to Stand: Able to stand without using hands and stabilize independently Standing Unsupported: Able to stand safely 2 minutes Sitting with Back Unsupported but Feet Supported on Floor or Stool: Able to sit safely and securely 2 minutes Stand to Sit: Controls descent by using hands Transfers: Able to transfer safely, definite need of hands Standing Unsupported with Eyes Closed: Able to stand 10 seconds with supervision Standing Ubsupported with Feet Together: Needs help to attain position but able to stand for 30 seconds with feet together From Standing, Reach Forward with Outstretched Arm: Can reach forward >5 cm safely (2") From Standing Position, Pick up Object from Floor: Able to pick up shoe, needs supervision From Standing Position, Turn to Look Behind Over each Shoulder: Looks behind one side only/other side shows less weight shift Turn 360 Degrees: Able to turn 360 degrees safely but slowly Standing Unsupported, Alternately Place Feet on Step/Stool: Able to complete >2 steps/needs minimal assist Standing Unsupported, One Foot in Front: Loses balance while stepping or  standing Standing on One Leg: Unable to try or needs assist to prevent fall Total Score: 33 Timed Up and Go Test TUG: Normal TUG Normal TUG (seconds): 46 Static Sitting Balance Static Sitting - Level of Assistance: 7: Independent Dynamic Sitting Balance Dynamic Sitting - Balance Support: No upper extremity supported Dynamic Sitting - Level of Assistance: 6: Modified independent (Device/Increase time) Static Standing Balance Static Standing - Balance Support: During functional activity Static Standing - Level of Assistance: 5: Stand by assistance Dynamic Standing Balance Dynamic Standing - Balance Support: During functional activity Dynamic Standing - Level of Assistance: 5: Stand by assistance Extremity Assessment      RLE Assessment RLE Assessment: Exceptions to [facility] General Strength Comments: grossly 4+/5 to 5/5 proximal to distal LLE Assessment LLE Assessment: Exceptions to [facility] General Strength Comments: grossly 4/5 proximal to distal    Junie Panning 09/08/2019, 3:00 PM

## 2019-09-09 LAB — GLUCOSE, CAPILLARY
Glucose-Capillary: 170 mg/dL — ABNORMAL HIGH (ref 70–99)
Glucose-Capillary: 228 mg/dL — ABNORMAL HIGH (ref 70–99)

## 2019-09-09 MED ORDER — PANTOPRAZOLE SODIUM 40 MG PO TBEC: 40.0000 mg | DELAYED_RELEASE_TABLET | Freq: Every day | ORAL | 0 refills | Status: DC

## 2019-09-09 MED ORDER — VITAMIN B-12 1000 MCG PO TABS
1000.0000 ug | ORAL_TABLET | Freq: Every day | ORAL | 11 refills | Status: DC
Start: 2019-09-09 — End: 2021-02-16

## 2019-09-09 MED ORDER — TRIMETHOPRIM 100 MG PO TABS: 100.0000 mg | ORAL_TABLET | Freq: Every day | ORAL | 3 refills | Status: DC

## 2019-09-09 MED ORDER — MIDODRINE HCL 5 MG PO TABS: 5.0000 mg | ORAL_TABLET | Freq: Two times a day (BID) | ORAL | 0 refills | Status: DC

## 2019-09-09 MED ORDER — LINAGLIPTIN 5 MG PO TABS: 5.0000 mg | ORAL_TABLET | Freq: Every day | ORAL | 1 refills | Status: DC

## 2019-09-09 MED ORDER — OXYCODONE HCL 5 MG PO TABS: 5.0000 mg | ORAL_TABLET | ORAL | 0 refills | Status: DC | PRN

## 2019-09-09 MED ORDER — INSULIN DETEMIR 100 UNIT/ML FLEXPEN: 40.0000 [IU] | PEN_INJECTOR | Freq: Two times a day (BID) | SUBCUTANEOUS | 11 refills | Status: DC

## 2019-09-09 NOTE — Progress Notes (Signed)
Inpatient Rehabilitation Care Coordinator  Discharge Note  The overall goal for the admission was met for:   Discharge location: Home with son and grand-daughter  Length of Stay: 9 days with discharge 09/09/19  Discharge activity level: Patient to discharge at overall mod I - supervision level.  Home/community participation: Acupuncturist provided included: MD, RD, PT, OT, SLP, RN, CM, Pharmacy and SW  Financial Services: Medicare Nurse, mental health)  Follow-up services arranged: Home Health: PT, OT, SLP  Comments (or additional information):  [employer]: [phone]  Patient/Family verbalized understanding of follow-up arrangements: Yes   Family is aware of recommendation for supervision secondary to safety awareness and cognition.  Individual responsible for coordination of the follow-up plan: [name] [phone]     Jaclyn [name]

## 2019-09-09 NOTE — Progress Notes (Signed)
Patient discharged with grand-daughter. Questions regarding medications answered by RN and PA. All belongings packed and escorted to car by NT.

## 2019-09-12 ENCOUNTER — Telehealth: Payer: Self-pay

## 2019-09-12 NOTE — Telephone Encounter (Signed)
Jaclyn [name] (DPR signed) called to schedule 42' HFU with DR Diona Browner on 09/13/19 at 9 AM after pt was d/c from hospital with stroke. Jaclyn [name] told Jaclyn [name] at front desk that last night pts BS was 541. Jaclyn [name] said that the doctor at hospital d/c lantus and Januvia and prescribed Levemir and Trajenta; pts insurance would not approve the levemir and Trajenta. Pt was discharged from hospital on 09/09/19 and Jaclyn [name]'s uncle did not give pt any lantus or januvia on 09/10/19;? Why. Jaclyn [name] said last night when she cked pts BS at [facility] gave pt lantus 64 units (pt usually takes 65 units but Jaclyn [name] could not find that amt due to someone putting the majority of pts lantus in the freezer and ruining the lantus. Jaclyn [name] said she had spoken with rehab PA this morning and the PA is sending in new rx of Lantus and pt already has Januvia at home. No one has cked pts BS since last nights reading of 541. Jaclyn [name] said that there are a couple of people at home with pt now but Jaclyn [name] does not think they can ck a BS and when [place] gets home between 4:30 pm and 5:00 pm Jaclyn [name] will ck pts BS. Jaclyn [name] said that she has to work. Jaclyn [name] said that pt does not have any frequency of urine, thirsty,N&V, dry mouth, jitteriness, or SOB. Jaclyn [name] said that pt is not experiencing any symptoms of a too high BS. Jaclyn [name] said that pt has probably run a high BS in the 500s more than she should have. Jaclyn [name] also has to get a stethoscope before she can ck pts BP. Jaclyn [name] will be with the pt when has my chart video visit on 09/13/19. ED precautions given to [facility] and she voiced understanding. FYI to Dr Diona Browner and Butch Penny CMA.

## 2019-09-13 ENCOUNTER — Telehealth: Payer: Self-pay

## 2019-09-13 ENCOUNTER — Encounter: Payer: Medicare Other | Admitting: Vascular Surgery

## 2019-09-13 ENCOUNTER — Encounter: Payer: Self-pay | Admitting: Family Medicine

## 2019-09-13 ENCOUNTER — Telehealth: Payer: Self-pay | Admitting: *Deleted

## 2019-09-13 ENCOUNTER — Telehealth (INDEPENDENT_AMBULATORY_CARE_PROVIDER_SITE_OTHER): Payer: Medicare Other | Admitting: Family Medicine

## 2019-09-13 ENCOUNTER — Other Ambulatory Visit: Payer: Self-pay

## 2019-09-13 DIAGNOSIS — F0151 Vascular dementia with behavioral disturbance: Secondary | ICD-10-CM | POA: Diagnosis not present

## 2019-09-13 DIAGNOSIS — Z8673 Personal history of transient ischemic attack (TIA), and cerebral infarction without residual deficits: Secondary | ICD-10-CM

## 2019-09-13 DIAGNOSIS — E1165 Type 2 diabetes mellitus with hyperglycemia: Secondary | ICD-10-CM

## 2019-09-13 DIAGNOSIS — I951 Orthostatic hypotension: Secondary | ICD-10-CM | POA: Diagnosis not present

## 2019-09-13 DIAGNOSIS — F01518 Vascular dementia, unspecified severity, with other behavioral disturbance: Secondary | ICD-10-CM

## 2019-09-13 MED ORDER — DULOXETINE HCL 30 MG PO CPEP: 30.0000 mg | ORAL_CAPSULE | Freq: Two times a day (BID) | ORAL | 1 refills | Status: DC

## 2019-09-13 MED ORDER — LANTUS SOLOSTAR 100 UNIT/ML ~~LOC~~ SOPN: 40.0000 [IU] | PEN_INJECTOR | Freq: Every day | SUBCUTANEOUS | 11 refills | Status: DC

## 2019-09-13 MED ORDER — LINAGLIPTIN 5 MG PO TABS: 5.0000 mg | ORAL_TABLET | Freq: Every day | ORAL | 1 refills | Status: DC

## 2019-09-13 MED ORDER — INSULIN REGULAR HUMAN 100 UNIT/ML IJ SOLN: INTRAMUSCULAR | 11 refills | Status: DC

## 2019-09-13 MED ORDER — APIXABAN 5 MG PO TABS: 5.0000 mg | ORAL_TABLET | Freq: Two times a day (BID) | ORAL | 1 refills | Status: DC

## 2019-09-13 NOTE — Telephone Encounter (Signed)
Called and left detailed message on [name]-granddaughter of patient voice mail with appt, date, time and location. Asked for a return call.

## 2019-09-13 NOTE — Telephone Encounter (Signed)
Prior Authorization for Tradjenta 5 mg completed on CoverMyMeds.  Sent to [facility] for review.  Can take up to 72 hours for a decision.

## 2019-09-13 NOTE — Patient Instructions (Addendum)
Use Lantus instead of Levemir given cost.  Take Lantus 40 Units twice daily.  Stay off [place] and continue tradjenta.  Follow BP and HR... continue midodrine until following up with Dr.Early. Call to set up follow up in 1 week.  Can use sliding scale insulin if needed for blood sugar elevated.   Diabetic Sliding Scale (I)  IF BLOOD SUGAR IS:   LESS THAN 100 ( NO INSULIN)   100-140 (0 UNITS)   141-180 (2 UNITS)   181-220 (4 UNITS)   221-260 (6 UNITS)   261-300 (8 UNITS)   301-340 (10 UNITS)  *    341-400 ( 12 UNITS)  MORE THAN 400 UNITS (14 UNITS)

## 2019-09-13 NOTE — Progress Notes (Signed)
VIRTUAL VISIT Due to national recommendations of social distancing due to [place] 19, a virtual visit is felt to be most appropriate for this patient at this time.   I connected with the patient on 09/13/19 at  9:00 AM EDT by virtual telehealth platform and verified that I am speaking with the correct person using two identifiers.   I discussed the limitations, risks, security and privacy concerns of performing an evaluation and management service by  virtual telehealth platform and the availability of in person appointments. I also discussed with the patient that there may be a patient responsible charge related to this service. The patient expressed understanding and agreed to proceed.  Patient location: Home Provider Location: [place] Participants: Eliezer Lofts and Hilma Favors   Chief Complaint  Patient presents with   Hospitalization Follow-up    History of Present Illness:  84 year old female presents for hospital follow up.  She is with her granddaughter  Admitted 08/31/2019 for acute CVA.. right middle cerebral artery Discharged: 09/09/2019   Hospital Course copied for informational purposes. Presented 08/21/2019 left-sided weakness altered mental status.  She was found down by her son.  Cranial CT scan unremarkable for acute process.  Patient did not receive TPA.  MRI of the brain showed a 6 mm acute ischemic nonhemorrhagic periventricular white matter infarction involving the posterior right frontal corona radiata centrum semiovale.  CT angiogram of head and neck negative for large vessel occlusion.  Echocardiogram with ejection fraction of 70% no wall motion abnormalities.  EEG x2 -.  Admission chemistry sodium 132 ammonia level 26 glucose 360 hemoglobin 11.4 urinalysis negative hemoglobin A1c 14.2.  Hospital course further complicated by right ICA stenosis patient underwent right carotid surgery with Dacron patch angioplasty 08/26/2019 per Dr. Donnetta Hutching complicated  postoperatively by hypotension requiring dopamine for 24 hours.  Neurology follow-up Eliquis resumed with low-dose aspirin.  Tolerating a regular diet.  Patient was admitted for a comprehensive rehab program.  Since she has been home over the weekend... blood sugars have been elevated at 500s ( missed meds Saturday), this AM after meds fasting 200s.   She has been changed to Levemir 40 Units BID and tradjenta as Lantus 65 units once daily and Januvia was not effective.  Eating habits are not great.  In hospital no low sugars, but was getting mealtime insulin.  Lantus is much cheaper. Grandaughter will be very available in next 2 weeks. Ms. Henandez can given her own insulin.  She is not getting any exercise... grand daughter plans to set up PT, OT, ST. Using bed side commode   She is followed by Dr. Donnetta Hutching.. 09/20/2019  [facility] MD.  Gradndaughter setting up appt with Neurology.   BP running good  not taking the midodrine this AM. 119/68, HR 62  no lightheadedness. After taking the midodrine yesterday BP 139/72  She was on post op hypotension... from anesthesia BP Readings from Last 3 Encounters:  09/13/19 119/68  09/09/19 (!) 121/56  08/31/19 (!) 156/61      COVID 19 screen No recent travel or known exposure to COVID19 The patient denies respiratory symptoms of COVID 19 at this time.  The importance of social distancing was discussed today.   Review of Systems  Constitutional: Positive for malaise/fatigue. Negative for chills and fever.  HENT: Negative for congestion and ear pain.   Eyes: Negative for pain and redness.  Respiratory: Negative for cough and shortness of breath.   Cardiovascular: Negative for chest pain,  palpitations and leg swelling.  Gastrointestinal: Negative for abdominal pain, blood in stool, constipation, diarrhea, nausea and vomiting.  Genitourinary: Negative for dysuria.  Musculoskeletal: Negative for falls and myalgias.  Skin: Negative for rash.   Neurological: Negative for dizziness.  Psychiatric/Behavioral: Negative for depression. The patient is not nervous/anxious.       Past Medical History:  Diagnosis Date   Atrial fibrillation ([place])    Benign neoplasm of colon    Carotid artery occlusion    60-79% right ICA stenosis   Carotid bruit    Cerebrovascular disease, unspecified    Difficult intubation 2008   During surgery to remove large polyp   Diverticulosis of colon (without mention of hemorrhage)    Dysthymic disorder    Fibromyalgia    Headache(784.0)    Insomnia, unspecified    Interstitial cystitis    Myalgia and myositis, unspecified    Osteoarthrosis, unspecified whether generalized or localized, unspecified site    Other and unspecified hyperlipidemia    Other chest pain    Other specified benign mammary dysplasias    TIA (transient ischemic attack)    Type II or unspecified type diabetes mellitus without mention of complication, not stated as uncontrolled    Unspecified essential hypertension    Unspecified hypothyroidism    Unspecified vitamin D deficiency    Urinary tract infection, site not specified     reports that she has never smoked. She has never used smokeless tobacco. She reports that she does not drink alcohol and does not use drugs.   Current Outpatient Medications:    acetaminophen (TYLENOL) 325 MG tablet, Take 2 tablets (650 mg total) by mouth every 4 (four) hours as needed for mild pain (or temp > 37.5 C (99.5 F))., Disp: 40 tablet, Rfl: 0   apixaban (ELIQUIS) 5 MG TABS tablet, Take 1 tablet (5 mg total) by mouth 2 (two) times daily., Disp: 180 tablet, Rfl: 1   atorvastatin (LIPITOR) 40 MG tablet, Take 1 tablet (40 mg total) by mouth daily., Disp: 30 tablet, Rfl: 0   B-D ULTRAFINE III SHORT PEN 31G X 8 MM MISC, USE TO INJECT INSULIN ONCE DAILY. DX: E11.65, Disp: 100 each, Rfl: 3   DULoxetine (CYMBALTA) 30 MG capsule, Take 30 mg by mouth 2 (two) times daily., Disp:  , Rfl:    LANTUS SOLOSTAR 100 UNIT/ML Solostar Pen, Inject 65 Units into the skin daily. , Disp: , Rfl:    levothyroxine (SYNTHROID) 112 MCG tablet, Take 1 tablet (112 mcg total) by mouth daily., Disp: 90 tablet, Rfl: 3   midodrine (PROAMATINE) 5 MG tablet, Take 1 tablet (5 mg total) by mouth 2 (two) times daily with breakfast and lunch., Disp: 60 tablet, Rfl: 0   pantoprazole (PROTONIX) 40 MG tablet, Take 1 tablet (40 mg total) by mouth daily., Disp: 30 tablet, Rfl: 0   sitaGLIPtin (JANUVIA) 100 MG tablet, Take 100 mg by mouth daily., Disp: , Rfl:    trimethoprim (TRIMPEX) 100 MG tablet, Take 1 tablet (100 mg total) by mouth daily., Disp: 90 tablet, Rfl: 3   vitamin B-12 (CYANOCOBALAMIN) 1000 MCG tablet, Take 1 tablet (1,000 mcg total) by mouth daily., Disp: 30 tablet, Rfl: 11   Vitamin D, Ergocalciferol, (DRISDOL) 1.25 MG (50000 UT) CAPS capsule, Take 1 capsule (50,000 Units total) by mouth every 7 (seven) days., Disp: 12 capsule, Rfl: 0   aspirin EC 81 MG EC tablet, Take 1 tablet (81 mg total) by mouth daily. Swallow whole. (Patient not taking: Reported  on 09/13/2019), Disp: 30 tablet, Rfl: 11   docusate sodium (COLACE) 100 MG capsule, Take 1 capsule (100 mg total) by mouth daily. (Patient not taking: Reported on 09/13/2019), Disp: 10 capsule, Rfl: 0   insulin detemir (LEVEMIR) 100 UNIT/ML FlexPen, Inject 40 Units into the skin 2 (two) times daily. (Patient not taking: Reported on 09/13/2019), Disp: 15 mL, Rfl: 11   linagliptin (TRADJENTA) 5 MG TABS tablet, Take 1 tablet (5 mg total) by mouth daily. (Patient not taking: Reported on 09/13/2019), Disp: 30 tablet, Rfl: 1   oxyCODONE (OXY IR/ROXICODONE) 5 MG immediate release tablet, Take 1-2 tablets (5-10 mg total) by mouth every 4 (four) hours as needed for moderate pain. (Patient not taking: Reported on 09/13/2019), Disp: 30 tablet, Rfl: 0   Observations/Objective: Blood pressure 119/68, pulse 62, temperature 97.6 F (36.4 C), temperature  source Tympanic, height 5\' 2"  (1.575 m), weight 159 lb (72.1 kg).  Physical Exam  Physical Exam Constitutional:      General: The patient is not in acute distress. Pulmonary:     Effort: Pulmonary effort is normal. No respiratory distress.  Neurological:     Mental Status: The patient is alert and oriented to person, place, and time.  Psychiatric:        Mood and Affect: Mood normal.        Behavior: Behavior normal.   Assessment and Plan Uncontrolled type 2 diabetes mellitus with hyperglycemia (HCC) Use Lantus instead of Levemir given cost.  Take Lantus 40 Units twice daily.  Stay off [place] and continue tradjenta. Given sliding scale to use for rapid acting insulin.   Close follow up.  Orthostatic hypotension Follow BP and HR... continue midodrine until following up with Dr.Early.  Dementia ([place]) Multifactorial.  She is requiring assistance from family for medication administration.  Discussed NH placement vs home... family currently would like to keep her at home.  History of CVA (cerebrovascular accident) Follow up with neurology.     I discussed the assessment and treatment plan with the patient. The patient was provided an opportunity to ask questions and all were answered. The patient agreed with the plan and demonstrated an understanding of the instructions.   The patient was advised to call back or seek an in-person evaluation if the symptoms worsen or if the condition fails to improve as anticipated.     Eliezer Lofts, MD

## 2019-09-14 NOTE — Telephone Encounter (Signed)
PA approved effective from 09/13/2019 through 09/12/2020.

## 2019-09-20 ENCOUNTER — Other Ambulatory Visit: Payer: Self-pay

## 2019-09-20 ENCOUNTER — Ambulatory Visit (INDEPENDENT_AMBULATORY_CARE_PROVIDER_SITE_OTHER): Payer: Self-pay | Admitting: Vascular Surgery

## 2019-09-20 ENCOUNTER — Encounter: Payer: Self-pay | Admitting: Vascular Surgery

## 2019-09-20 VITALS — BP 122/70 | HR 83 | Temp 97.7°F | Resp 18 | Ht 62.0 in | Wt 159.0 lb

## 2019-09-20 DIAGNOSIS — I6523 Occlusion and stenosis of bilateral carotid arteries: Secondary | ICD-10-CM

## 2019-09-20 NOTE — Progress Notes (Signed)
Patient name: Jaclyn [name] MRN: [id number] DOB: [birth year] Sex: female  REASON FOR VISIT: Follow-up recent right carotid endarterectomy for symptomatic disease on 08/26/2019  HPI: Jaclyn [name] is a 84 y.o. female here today for follow-up.  She would admitted to [facility] for stroke.  Work-up revealed critical right carotid stenosis.  After stabilization she underwent an uneventful endarterectomy on 08/26/2019.  She had some neurologic changes which were nonfocal and underwent repeat CT scan on 08/29/2018 when showing widely patent endarterectomy with no technical difficulty.  She does have moderate to severe left internal carotid artery stenosis which is asymptomatic  Current Outpatient Medications  Medication Sig Dispense Refill  . apixaban (ELIQUIS) 5 MG TABS tablet Take 1 tablet (5 mg total) by mouth 2 (two) times daily. 180 tablet 1  . aspirin EC 81 MG EC tablet Take 1 tablet (81 mg total) by mouth daily. Swallow whole. 30 tablet 11  . atorvastatin (LIPITOR) 40 MG tablet Take 1 tablet (40 mg total) by mouth daily. 30 tablet 0  . B-D ULTRAFINE III SHORT PEN 31G X 8 MM MISC USE TO INJECT INSULIN ONCE DAILY. DX: E11.65 100 each 3  . DULoxetine (CYMBALTA) 30 MG capsule Take 1 capsule (30 mg total) by mouth 2 (two) times daily. 180 capsule 1  . insulin regular (HUMULIN R) 100 units/mL injection Use sliding scale as directed. 3 mL 11  . LANTUS SOLOSTAR 100 UNIT/ML Solostar Pen Inject 40 Units into the skin daily. (Patient taking differently: Inject 40 Units into the skin 2 (two) times daily. ) 30 mL 11  . levothyroxine (SYNTHROID) 112 MCG tablet Take 1 tablet (112 mcg total) by mouth daily. 90 tablet 3  . midodrine (PROAMATINE) 5 MG tablet Take 1 tablet (5 mg total) by mouth 2 (two) times daily with breakfast and lunch. 60 tablet 0  . pantoprazole (PROTONIX) 40 MG tablet Take 1 tablet (40 mg total) by mouth daily. 30 tablet 0  . trimethoprim (TRIMPEX)  100 MG tablet Take 1 tablet (100 mg total) by mouth daily. 90 tablet 3  . vitamin B-12 (CYANOCOBALAMIN) 1000 MCG tablet Take 1 tablet (1,000 mcg total) by mouth daily. 30 tablet 11  . Vitamin D, Ergocalciferol, (DRISDOL) 1.25 MG (50000 UT) CAPS capsule Take 1 capsule (50,000 Units total) by mouth every 7 (seven) days. 12 capsule 0  . acetaminophen (TYLENOL) 325 MG tablet Take 2 tablets (650 mg total) by mouth every 4 (four) hours as needed for mild pain (or temp > 37.5 C (99.5 F)). 40 tablet 0  . linagliptin (TRADJENTA) 5 MG TABS tablet Take 1 tablet (5 mg total) by mouth daily. (Patient not taking: Reported on 09/20/2019) 90 tablet 1   No current facility-administered medications for this visit.     PHYSICAL EXAM: Vitals:   09/20/19 0916 09/20/19 0921  BP: 126/65 122/70  Pulse: 83   Resp: 18   Temp: 97.7 F (36.5 C)   TempSrc: Temporal   SpO2: 95%   Weight: 159 lb (72.1 kg)   Height: 5\' 2"  (1.575 m)     GENERAL: The patient is a well-nourished female, in no acute distress. The vital signs are documented above. Right neck incision is well-healed.  No tenderness  MEDICAL ISSUES: She is here today with her granddaughter.  She reports no new neurologic deficits.  There is some question regarding blood pressure medications and specifically midodrine.  I explained that we would tolerate a rather wide range of blood pressure with anywhere from  the low 100s up to 784 for systolic pressure.  Explained that we would not make any specific recommendations for her treatment due to carotid disease.  We will see her again in 9 months with repeat carotid duplex   Rosetta Posner, MD [facility] Tel [phone] Pager [phone]

## 2019-09-22 ENCOUNTER — Other Ambulatory Visit: Payer: Self-pay | Admitting: *Deleted

## 2019-09-22 ENCOUNTER — Encounter: Payer: Medicare Other | Admitting: Physical Medicine & Rehabilitation

## 2019-09-22 DIAGNOSIS — I6523 Occlusion and stenosis of bilateral carotid arteries: Secondary | ICD-10-CM

## 2019-10-27 NOTE — Assessment & Plan Note (Signed)
Multifactorial.  She is requiring assistance from family for medication administration.  Discussed NH placement vs home... family currently would like to keep her at home.

## 2019-10-27 NOTE — Assessment & Plan Note (Signed)
Use Lantus instead of Levemir given cost.  Take Lantus 40 Units twice daily.  Stay off [place] and continue tradjenta. Given sliding scale to use for rapid acting insulin.   Close follow up.

## 2019-10-27 NOTE — Assessment & Plan Note (Signed)
Follow-up with neurology 

## 2019-10-27 NOTE — Assessment & Plan Note (Signed)
Follow BP and HR... continue midodrine until following up with Dr.Early.

## 2019-11-18 ENCOUNTER — Ambulatory Visit: Payer: Medicare Other | Admitting: Family Medicine

## 2019-12-20 NOTE — Telephone Encounter (Signed)
Pt had video visit 09/13/19.

## 2019-12-22 ENCOUNTER — Telehealth: Payer: Self-pay | Admitting: Family Medicine

## 2019-12-22 NOTE — Progress Notes (Signed)
  Chronic Care Management   Outreach Note  12/22/2019 Name: Jaclyn [name] MRN: [id number] DOB: 1935/05/07  Referred by: Jinny Sanders, MD Reason for referral : Chronic Care Management   An unsuccessful telephone outreach was attempted today. The patient was referred to the pharmacist for assistance with care management and care coordination.   Follow Up Plan:   Hilario Quarry  Upstream Scheduler

## 2019-12-26 ENCOUNTER — Other Ambulatory Visit: Payer: Self-pay | Admitting: Family Medicine

## 2019-12-26 NOTE — Telephone Encounter (Signed)
Last office visit 09/13/2019 hospital follow up.  AVS states to call and schedule follow up in 1 week.  No future appointments.  Last refilled 09/13/2019 for #180 with 1 refill.  Ok to refill?

## 2020-01-06 ENCOUNTER — Ambulatory Visit: Payer: Medicare Other | Admitting: Family Medicine

## 2020-01-31 ENCOUNTER — Ambulatory Visit: Payer: Medicare Other | Admitting: Family Medicine

## 2020-02-16 ENCOUNTER — Other Ambulatory Visit: Payer: Self-pay | Admitting: Family Medicine

## 2020-02-21 ENCOUNTER — Ambulatory Visit: Payer: Medicare Other | Admitting: Family Medicine

## 2020-02-29 ENCOUNTER — Other Ambulatory Visit: Payer: Self-pay | Admitting: Family Medicine

## 2020-03-01 ENCOUNTER — Telehealth: Payer: Self-pay | Admitting: Family Medicine

## 2020-03-01 NOTE — Telephone Encounter (Signed)
Pt called in wanted to come to back due to she has not had covid shot

## 2020-03-01 NOTE — Telephone Encounter (Signed)
She is wanting to come thru the back instead of walking in through the front

## 2020-03-01 NOTE — Telephone Encounter (Signed)
I don't understand this message.  Can you clarify what patient is wanting??

## 2020-03-01 NOTE — Telephone Encounter (Signed)
Noted  

## 2020-03-06 ENCOUNTER — Ambulatory Visit: Payer: Medicare Other | Admitting: Family Medicine

## 2020-03-06 ENCOUNTER — Telehealth: Payer: Self-pay | Admitting: Family Medicine

## 2020-03-06 MED ORDER — FLUCONAZOLE 150 MG PO TABS
150.0000 mg | ORAL_TABLET | Freq: Once | ORAL | 0 refills | Status: AC
Start: 2020-03-06 — End: 2020-03-06

## 2020-03-06 NOTE — Telephone Encounter (Signed)
Call in fluconazole 150 mg x 1 , 1 0 RF.Marland Kitchen let pt know.

## 2020-03-06 NOTE — Telephone Encounter (Signed)
Jaclyn [name] notified by telephone that Dr. Diona Browner has sent her in a prescription for her yeast infection to [facility] in [place].

## 2020-03-06 NOTE — Telephone Encounter (Signed)
Pt called to reschedule her appointment today. The next day she can come is 1/28. She states she has a yeast infection and she is wondering if anything can be called in sooner than 1/28. I offered sooner appointments and she declined.

## 2020-03-14 ENCOUNTER — Telehealth: Payer: Self-pay | Admitting: Family Medicine

## 2020-03-14 NOTE — Chronic Care Management (AMB) (Signed)
  Chronic Care Management   Note  03/14/2020 Name: Jaclyn [name] MRN: [id number] DOB: 08/22/1935  Jaclyn [name] is a 85 y.o. year old female who is a primary care patient of Bedsole, Amy E, MD. I reached out to Hilma Favors by phone today in response to a referral sent by [name] PCP, Jinny Sanders, MD.   [name] was given information about Chronic Care Management services today including:  1. CCM service includes personalized support from designated clinical staff supervised by her physician, including individualized plan of care and coordination with other care providers 2. 24/7 contact phone numbers for assistance for urgent and routine care needs. 3. Service will only be billed when office clinical staff spend 20 minutes or more in a month to coordinate care. 4. Only one practitioner may furnish and bill the service in a calendar month. 5. The patient may stop CCM services at any time (effective at the end of the month) by phone call to the office staff.   [name] verbally agreed to assistance and services provided by embedded care coordination/care management team today.  Follow up plan:   [place]

## 2020-03-14 NOTE — Chronic Care Management (AMB) (Signed)
  Chronic Care Management   Outreach Note  03/14/2020 Name: Jaclyn [name] MRN: [id number] DOB: 05-28-1935  Referred by: Jinny Sanders, MD Reason for referral : Chronic Care Management   A second unsuccessful telephone outreach was attempted today. The patient was referred to pharmacist for assistance with care management and care coordination.  Follow Up Plan:   Hilario Quarry  Upstream Scheduler

## 2020-03-16 ENCOUNTER — Other Ambulatory Visit: Payer: Self-pay | Admitting: Family Medicine

## 2020-03-16 ENCOUNTER — Other Ambulatory Visit: Payer: Self-pay

## 2020-03-16 ENCOUNTER — Ambulatory Visit (INDEPENDENT_AMBULATORY_CARE_PROVIDER_SITE_OTHER): Payer: Medicare Other | Admitting: Family Medicine

## 2020-03-16 ENCOUNTER — Encounter: Payer: Self-pay | Admitting: Family Medicine

## 2020-03-16 VITALS — BP 120/70 | HR 90 | Temp 97.4°F | Ht 62.0 in | Wt 144.2 lb

## 2020-03-16 DIAGNOSIS — F0151 Vascular dementia with behavioral disturbance: Secondary | ICD-10-CM | POA: Diagnosis not present

## 2020-03-16 DIAGNOSIS — I63239 Cerebral infarction due to unspecified occlusion or stenosis of unspecified carotid arteries: Secondary | ICD-10-CM

## 2020-03-16 DIAGNOSIS — E1165 Type 2 diabetes mellitus with hyperglycemia: Secondary | ICD-10-CM

## 2020-03-16 DIAGNOSIS — E118 Type 2 diabetes mellitus with unspecified complications: Secondary | ICD-10-CM | POA: Diagnosis not present

## 2020-03-16 DIAGNOSIS — F01518 Vascular dementia, unspecified severity, with other behavioral disturbance: Secondary | ICD-10-CM

## 2020-03-16 LAB — POCT GLYCOSYLATED HEMOGLOBIN (HGB A1C): Hemoglobin A1C: 13.7 % — AB (ref 4.0–5.6)

## 2020-03-16 MED ORDER — TRULICITY 0.75 MG/0.5ML ~~LOC~~ SOAJ
0.7500 mg | SUBCUTANEOUS | 11 refills | Status: DC
Start: 2020-03-16 — End: 2020-10-19

## 2020-03-16 NOTE — Progress Notes (Signed)
Patient ID: Jaclyn [name], female    DOB: October 21, 1935, 85 y.o.   MRN: [id number]  This visit was conducted in person.  BP 120/70   Pulse 90   Temp (!) 97.4 F (36.3 C) (Temporal)   Ht 5\' 2"  (1.575 m)   Wt 144 lb 4 oz (65.4 kg)   SpO2 97%   BMI 26.38 kg/m    CC:  Chief Complaint  Patient presents with  . Diabetes    Subjective:   HPI: Jaclyn [name] is a 85 y.o. female presenting on 03/16/2020 for Diabetes   She has been set up with CCM referral.  Diabetes:  A1C 14 last check in 08/2019   She reports CBGs never goes below 300.  At last OV told to take:  Lantus 40 units daily  Tradjenta 5 mg daily.. not taking.. too expensive Humalog SSI not taking  Stop [place]... she is taking at 100 mg dialy Using medications without difficulties: Hypoglycemic episodes: Hyperglycemic episodes: YES 400-500 Feet problems: no ulcers Blood Sugars averaging: eye exam within last year:  CVA  With  right critical carotid stenosis noted. Underwent CEA in 08/26/2019. Following this she had some nonfocal neuro complications and  underwent repeat CT scan on 08/29/2018 when showing widely patent endarterectomy with no technical difficulty.  She does have moderate to severe left internal carotid artery stenosis which is asymptomatic Followed by Jaclyn [name]..  09/20/2019( reviewed note in detail)       Relevant past medical, surgical, family and social history reviewed and updated as indicated. Interim medical history since our last visit reviewed. Allergies and medications reviewed and updated. Outpatient Medications Prior to Visit  Medication Sig Dispense Refill  . apixaban (ELIQUIS) 5 MG TABS tablet Take 1 tablet (5 mg total) by mouth 2 (two) times daily. 180 tablet 1  . aspirin EC 81 MG EC tablet Take 1 tablet (81 mg total) by mouth daily. Swallow whole. 30 tablet 11  . B-D ULTRAFINE III SHORT PEN 31G X 8 MM MISC USE TO INJECT INSULIN ONCE DAILY. DX: E11.65 100 each 3  . DULoxetine  (CYMBALTA) 30 MG capsule TAKE 2 CAPSULES BY MOUTH EVERY DAY 180 capsule 1  . JANUVIA 100 MG tablet Take 100 mg by mouth daily.    Marland Kitchen LANTUS SOLOSTAR 100 UNIT/ML Solostar Pen Inject 40 Units into the skin daily. 30 mL 11  . levothyroxine (SYNTHROID) 112 MCG tablet Take 1 tablet (112 mcg total) by mouth daily. 90 tablet 3  . midodrine (PROAMATINE) 5 MG tablet Take 1 tablet (5 mg total) by mouth 2 (two) times daily with breakfast and lunch. 60 tablet 0  . pantoprazole (PROTONIX) 40 MG tablet Take 1 tablet (40 mg total) by mouth daily. 30 tablet 0  . trimethoprim (TRIMPEX) 100 MG tablet Take 1 tablet (100 mg total) by mouth daily. 90 tablet 3  . vitamin B-12 (CYANOCOBALAMIN) 1000 MCG tablet Take 1 tablet (1,000 mcg total) by mouth daily. 30 tablet 11  . Vitamin D, Ergocalciferol, (DRISDOL) 1.25 MG (50000 UT) CAPS capsule Take 1 capsule (50,000 Units total) by mouth every 7 (seven) days. 12 capsule 0  . atorvastatin (LIPITOR) 40 MG tablet Take 1 tablet (40 mg total) by mouth daily. 30 tablet 0  . insulin regular (HUMULIN R) 100 units/mL injection Use sliding scale as directed. (Patient not taking: Reported on 03/16/2020) 3 mL 11  . linagliptin (TRADJENTA) 5 MG TABS tablet Take 1 tablet (5 mg total) by mouth daily. (Patient not  taking: No sig reported) 90 tablet 1  . acetaminophen (TYLENOL) 325 MG tablet Take 2 tablets (650 mg total) by mouth every 4 (four) hours as needed for mild pain (or temp > 37.5 C (99.5 F)). 40 tablet 0   No facility-administered medications prior to visit.     Per HPI unless specifically indicated in ROS section below Review of Systems  Constitutional: Positive for fatigue. Negative for fever.  HENT: Negative for congestion.   Eyes: Negative for pain.  Respiratory: Negative for cough and shortness of breath.   Cardiovascular: Negative for chest pain, palpitations and leg swelling.  Gastrointestinal: Negative for abdominal pain.  Genitourinary: Negative for dysuria and  vaginal bleeding.  Musculoskeletal: Negative for back pain.  Neurological: Negative for syncope, light-headedness and headaches.  Psychiatric/Behavioral: Negative for dysphoric mood.   Objective:  BP 120/70   Pulse 90   Temp (!) 97.4 F (36.3 C) (Temporal)   Ht 5\' 2"  (1.575 m)   Wt 144 lb 4 oz (65.4 kg)   SpO2 97%   BMI 26.38 kg/m   Wt Readings from Last 3 Encounters:  03/16/20 144 lb 4 oz (65.4 kg)  09/20/19 159 lb (72.1 kg)  09/13/19 159 lb (72.1 kg)      Physical Exam Constitutional:      General: She is not in acute distress.    Appearance: Normal appearance. She is well-developed. She is obese. She is not ill-appearing or toxic-appearing.  HENT:     Head: Normocephalic.     Right Ear: Tympanic membrane, ear canal and external ear normal. Decreased hearing noted. Tympanic membrane is not erythematous, retracted or bulging.     Left Ear: Tympanic membrane, ear canal and external ear normal. Decreased hearing noted. Tympanic membrane is not erythematous, retracted or bulging.     Nose: No mucosal edema or rhinorrhea.     Right Sinus: No maxillary sinus tenderness or frontal sinus tenderness.     Left Sinus: No maxillary sinus tenderness or frontal sinus tenderness.     Mouth/Throat:     Pharynx: Uvula midline.  Eyes:     General: Lids are normal. Lids are everted, no foreign bodies appreciated.     Conjunctiva/sclera: Conjunctivae normal.     Pupils: Pupils are equal, round, and reactive to light.  Neck:     Thyroid: No thyroid mass or thyromegaly.     Vascular: No carotid bruit.     Trachea: Trachea normal.  Cardiovascular:     Rate and Rhythm: Normal rate and regular rhythm.     Pulses: Normal pulses.     Heart sounds: Normal heart sounds, S1 normal and S2 normal. No murmur heard. No friction rub. No gallop.   Pulmonary:     Effort: Pulmonary effort is normal. No tachypnea or respiratory distress.     Breath sounds: Normal breath sounds. No decreased breath  sounds, wheezing, rhonchi or rales.  Abdominal:     General: Bowel sounds are normal.     Palpations: Abdomen is soft.     Tenderness: There is no abdominal tenderness.  Musculoskeletal:     Cervical back: Normal range of motion and neck supple.  Skin:    General: Skin is warm and dry.     Findings: No rash.  Neurological:     Mental Status: She is alert.  Psychiatric:        Mood and Affect: Mood is not anxious or depressed.        Speech: Speech normal.  Behavior: Behavior normal. Behavior is cooperative.        Thought Content: Thought content normal.        Judgment: Judgment normal.       Results for orders placed or performed during the hospital encounter of 08/31/19  CBC WITH DIFFERENTIAL  Result Value Ref Range   WBC 7.3 4.0 - 10.5 K/uL   RBC 3.23 (L) 3.87 - 5.11 MIL/uL   Hemoglobin 9.5 (L) 12.0 - 15.0 g/dL   HCT 22.6 (L) 33.3 - 54.5 %   MCV 91.0 80.0 - 100.0 fL   MCH 29.4 26.0 - 34.0 pg   MCHC 32.3 30.0 - 36.0 g/dL   RDW 62.5 63.8 - 93.7 %   Platelets 245 150 - 400 K/uL   nRBC 0.0 0.0 - 0.2 %   Neutrophils Relative % 69 %   Neutro Abs 5.1 1.7 - 7.7 K/uL   Lymphocytes Relative 18 %   Lymphs Abs 1.3 0.7 - 4.0 K/uL   Monocytes Relative 10 %   Monocytes Absolute 0.7 0.1 - 1.0 K/uL   Eosinophils Relative 2 %   Eosinophils Absolute 0.1 0.0 - 0.5 K/uL   Basophils Relative 0 %   Basophils Absolute 0.0 0.0 - 0.1 K/uL   Immature Granulocytes 1 %   Abs Immature Granulocytes 0.04 0.00 - 0.07 K/uL  Comprehensive metabolic panel  Result Value Ref Range   Sodium 139 135 - 145 mmol/L   Potassium 3.3 (L) 3.5 - 5.1 mmol/L   Chloride 104 98 - 111 mmol/L   CO2 24 22 - 32 mmol/L   Glucose, Bld 77 70 - 99 mg/dL   BUN 7 (L) 8 - 23 mg/dL   Creatinine, Ser 3.42 0.44 - 1.00 mg/dL   Calcium 8.5 (L) 8.9 - 10.3 mg/dL   Total Protein 5.9 (L) 6.5 - 8.1 g/dL   Albumin 2.7 (L) 3.5 - 5.0 g/dL   AST 16 15 - 41 U/L   ALT 15 0 - 44 U/L   Alkaline Phosphatase 68 38 - 126 U/L    Total Bilirubin 0.6 0.3 - 1.2 mg/dL   GFR calc non Af Amer >60 >60 mL/min   GFR calc Af Amer >60 >60 mL/min   Anion gap 11 5 - 15  Glucose, capillary  Result Value Ref Range   Glucose-Capillary 215 (H) 70 - 99 mg/dL  Glucose, capillary  Result Value Ref Range   Glucose-Capillary 79 70 - 99 mg/dL  Glucose, capillary  Result Value Ref Range   Glucose-Capillary 120 (H) 70 - 99 mg/dL  Glucose, capillary  Result Value Ref Range   Glucose-Capillary 171 (H) 70 - 99 mg/dL  Glucose, capillary  Result Value Ref Range   Glucose-Capillary 158 (H) 70 - 99 mg/dL  Glucose, capillary  Result Value Ref Range   Glucose-Capillary 85 70 - 99 mg/dL  Glucose, capillary  Result Value Ref Range   Glucose-Capillary 215 (H) 70 - 99 mg/dL  Glucose, capillary  Result Value Ref Range   Glucose-Capillary 195 (H) 70 - 99 mg/dL  Glucose, capillary  Result Value Ref Range   Glucose-Capillary 98 70 - 99 mg/dL  Glucose, capillary  Result Value Ref Range   Glucose-Capillary 104 (H) 70 - 99 mg/dL  Glucose, capillary  Result Value Ref Range   Glucose-Capillary 193 (H) 70 - 99 mg/dL  Glucose, capillary  Result Value Ref Range   Glucose-Capillary 137 (H) 70 - 99 mg/dL  Glucose, capillary  Result Value Ref Range  Glucose-Capillary 79 70 - 99 mg/dL  Glucose, capillary  Result Value Ref Range   Glucose-Capillary 66 (L) 70 - 99 mg/dL  Glucose, capillary  Result Value Ref Range   Glucose-Capillary 91 70 - 99 mg/dL  Glucose, capillary  Result Value Ref Range   Glucose-Capillary 193 (H) 70 - 99 mg/dL  CBC  Result Value Ref Range   WBC 5.7 4.0 - 10.5 K/uL   RBC 3.57 (L) 3.87 - 5.11 MIL/uL   Hemoglobin 10.6 (L) 12.0 - 15.0 g/dL   HCT 32.5 (L) 36.0 - 46.0 %   MCV 91.0 80.0 - 100.0 fL   MCH 29.7 26.0 - 34.0 pg   MCHC 32.6 30.0 - 36.0 g/dL   RDW 15.1 11.5 - 15.5 %   Platelets 290 150 - 400 K/uL   nRBC 0.0 0.0 - 0.2 %  Glucose, capillary  Result Value Ref Range   Glucose-Capillary 233 (H) 70 - 99 mg/dL   Glucose, capillary  Result Value Ref Range   Glucose-Capillary 283 (H) 70 - 99 mg/dL  Basic metabolic panel  Result Value Ref Range   Sodium 137 135 - 145 mmol/L   Potassium 4.2 3.5 - 5.1 mmol/L   Chloride 102 98 - 111 mmol/L   CO2 24 22 - 32 mmol/L   Glucose, Bld 260 (H) 70 - 99 mg/dL   BUN 11 8 - 23 mg/dL   Creatinine, Ser 0.97 0.44 - 1.00 mg/dL   Calcium 9.2 8.9 - 10.3 mg/dL   GFR calc non Af Amer 54 (L) >60 mL/min   GFR calc Af Amer >60 >60 mL/min   Anion gap 11 5 - 15  Glucose, capillary  Result Value Ref Range   Glucose-Capillary 340 (H) 70 - 99 mg/dL  Glucose, capillary  Result Value Ref Range   Glucose-Capillary 277 (H) 70 - 99 mg/dL  Glucose, capillary  Result Value Ref Range   Glucose-Capillary 259 (H) 70 - 99 mg/dL  Glucose, capillary  Result Value Ref Range   Glucose-Capillary 267 (H) 70 - 99 mg/dL  Glucose, capillary  Result Value Ref Range   Glucose-Capillary 235 (H) 70 - 99 mg/dL  Glucose, capillary  Result Value Ref Range   Glucose-Capillary 369 (H) 70 - 99 mg/dL  Glucose, capillary  Result Value Ref Range   Glucose-Capillary 253 (H) 70 - 99 mg/dL  Glucose, capillary  Result Value Ref Range   Glucose-Capillary 342 (H) 70 - 99 mg/dL  Glucose, capillary  Result Value Ref Range   Glucose-Capillary 241 (H) 70 - 99 mg/dL  Glucose, capillary  Result Value Ref Range   Glucose-Capillary 200 (H) 70 - 99 mg/dL  Glucose, capillary  Result Value Ref Range   Glucose-Capillary 272 (H) 70 - 99 mg/dL  Glucose, capillary  Result Value Ref Range   Glucose-Capillary 325 (H) 70 - 99 mg/dL  Glucose, capillary  Result Value Ref Range   Glucose-Capillary 185 (H) 70 - 99 mg/dL  Glucose, capillary  Result Value Ref Range   Glucose-Capillary 272 (H) 70 - 99 mg/dL   Comment 1 Notify RN   Glucose, capillary  Result Value Ref Range   Glucose-Capillary 405 (H) 70 - 99 mg/dL   Comment 1 Notify RN   Glucose, capillary  Result Value Ref Range   Glucose-Capillary 319  (H) 70 - 99 mg/dL  Glucose, capillary  Result Value Ref Range   Glucose-Capillary 282 (H) 70 - 99 mg/dL  Glucose, capillary  Result Value Ref Range   Glucose-Capillary  170 (H) 70 - 99 mg/dL  Glucose, capillary  Result Value Ref Range   Glucose-Capillary 228 (H) 70 - 99 mg/dL    This visit occurred during the SARS-CoV-2 public health emergency.  Safety protocols were in place, including screening questions prior to the visit, additional usage of staff PPE, and extensive cleaning of exam room while observing appropriate contact time as indicated for disinfecting solutions.   COVID 19 screen:  No recent travel or known exposure to COVID19 The patient denies respiratory symptoms of COVID 19 at this time. The importance of social distancing was discussed today.   Assessment and Plan    Problem List Items Addressed This Visit    Carotid stenosis, symptomatic, with infarction ([place])    Followed by Jaclyn [name]..  09/20/2019( reviewed note in detail)        Dementia [facility])     Family assisting with care and present at appt.      Poorly controlled type 2 diabetes mellitus with complication (HCC)    Appreciate CCm pharmacist assistance.   Increase Lantus to 60 Units.  Continue Januvia.  Start Trulicity weekly  Injections.  Check fasting blood sugar each morning.. record measurements and bring to next appt... re-eval in 3 months.       Relevant Medications   Dulaglutide (TRULICITY) A999333 [id number] SOPN   Uncontrolled type 2 diabetes mellitus with hyperglycemia (HCC) - Primary   Relevant Medications   Dulaglutide (TRULICITY) A999333 0000000 SOPN   Other Relevant Orders   POCT glycosylated hemoglobin (Hb A1C) (Completed)      Meds ordered this encounter  Medications  . Dulaglutide (TRULICITY) A999333 0000000 SOPN    Sig: Inject 0.75 mg into the skin once a week.    Dispense:  3 mL    Refill:  11   Orders Placed This Encounter  Procedures  . POCT glycosylated hemoglobin (Hb A1C)       Eliezer Lofts, MD

## 2020-03-16 NOTE — Patient Instructions (Addendum)
Increase Lantus to 60 Units.  Continue Januvia.  Start Trulicity weekly  Injections.  Check fasting blood sugar each morning.. record measurements and bring to next appt.

## 2020-03-19 ENCOUNTER — Telehealth: Payer: Self-pay | Admitting: *Deleted

## 2020-03-19 NOTE — Telephone Encounter (Signed)
Received in basket message from [facility] stating PA is required for Trulicity.  PA completed on CoverMyMeds and sent to [facility] for review.  Can take up to 72 hours for a decision.

## 2020-03-20 NOTE — Telephone Encounter (Signed)
[employer] left v/m that Jaclyn [name]'s trulicity was approved from 03/19/20 - 03/19/21. If any questions can call [employer]. Sending note to [facility] CMA.

## 2020-03-22 ENCOUNTER — Other Ambulatory Visit: Payer: Self-pay | Admitting: Family Medicine

## 2020-04-06 ENCOUNTER — Ambulatory Visit: Payer: Medicare Other | Admitting: Family Medicine

## 2020-04-18 ENCOUNTER — Telehealth: Payer: Self-pay

## 2020-04-18 NOTE — Telephone Encounter (Signed)
Hx of CVA.Marland Kitchen yes needs to continue atorvastatin.

## 2020-04-18 NOTE — Chronic Care Management (AMB) (Signed)
Chronic Care Management Pharmacy Assistant   Name: Jaclyn [name]  MRN: [id number] DOB: 12/01/1935  Reason for Encounter: Initial questions for CCM visit scheduled 04/23/20  PCP : Jaclyn Sanders, MD  Allergies:   Allergies  Allergen Reactions  . Naproxen Sodium Other (See Comments)    Fever/aches and pains  . Statins Other (See Comments)    myalgias  . Sulfonamide Derivatives Hives and Itching  . Latex Itching and Rash  . Shellfish Allergy Itching, Swelling and Rash    Seafood, shrimp    Medications: Outpatient Encounter Medications as of 04/18/2020  Medication Sig  . apixaban (ELIQUIS) 5 MG TABS tablet Take 1 tablet (5 mg total) by mouth 2 (two) times daily.  Marland Kitchen aspirin EC 81 MG EC tablet Take 1 tablet (81 mg total) by mouth daily. Swallow whole.  Marland Kitchen atorvastatin (LIPITOR) 40 MG tablet Take 1 tablet (40 mg total) by mouth daily.  . B-D ULTRAFINE III SHORT PEN 31G X 8 MM MISC USE TO INJECT INSULIN ONCE DAILY. DX: E11.65  . Dulaglutide (TRULICITY) 8.58 IF/0.2DX SOPN Inject 0.75 mg into the skin once a week.  . DULoxetine (CYMBALTA) 30 MG capsule TAKE 2 CAPSULES BY MOUTH EVERY DAY  . insulin regular (HUMULIN R) 100 units/mL injection Use sliding scale as directed. (Patient not taking: Reported on 03/16/2020)  . JANUVIA 100 MG tablet TAKE 1 TABLET BY MOUTH EVERY DAY  . LANTUS SOLOSTAR 100 UNIT/ML Solostar Pen Inject 40 Units into the skin daily.  Marland Kitchen levothyroxine (SYNTHROID) 112 MCG tablet Take 1 tablet (112 mcg total) by mouth daily.  . midodrine (PROAMATINE) 5 MG tablet Take 1 tablet (5 mg total) by mouth 2 (two) times daily with breakfast and lunch.  . pantoprazole (PROTONIX) 40 MG tablet Take 1 tablet (40 mg total) by mouth daily.  Marland Kitchen trimethoprim (TRIMPEX) 100 MG tablet Take 1 tablet (100 mg total) by mouth daily.  . vitamin B-12 (CYANOCOBALAMIN) 1000 MCG tablet Take 1 tablet (1,000 mcg total) by mouth daily.  . Vitamin D, Ergocalciferol, (DRISDOL) 1.25 MG (50000 UT) CAPS  capsule Take 1 capsule (50,000 Units total) by mouth every 7 (seven) days.   No facility-administered encounter medications on file as of 04/18/2020.    Current Diagnosis: Patient Active Problem List   Diagnosis Date Noted  . Hypokalemia   . Labile blood glucose   . Uncontrolled type 2 diabetes mellitus with hyperglycemia ([place])   . Orthostatic hypotension   . Elevated blood pressure reading   . 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 Recurrent UTI   . Dyslipidemia   . Orthostasis   . Dysarthria   . Vagina, candidiasis 03/10/2019  . Dementia ([place]) 03/10/2019  . Acute respiratory failure with hypoxia ([place]) 01/24/2019  . Acute metabolic encephalopathy 41/28/7867  . COVID-19 virus infection 01/15/2019  . Acute lower UTI 01/15/2019  . B12 deficiency 01/22/2018  . History of CVA (cerebrovascular accident) 06/20/2017  . Stenosis of right carotid artery   . Peripheral edema 07/29/2016  . Imbalance 04/07/2016  . Intertriginous candidiasis 04/26/2015  . Hard of hearing 04/12/2015  . TIA (transient ischemic attack) 04/10/2015  . History of recurrent UTIs 04/10/2015  . Dysuria 03/09/2015  . Tremor 05/11/2014  . Leg weakness, bilateral 05/11/2014  . Fatty liver 08/04/2013  . Personal history of colonic polyps 10/05/2012  . Esophageal reflux 10/05/2012  . Carotid stenosis, symptomatic, with infarction ([place]) 10/14/2010  . ANXIETY DEPRESSION 01/13/2008  . Essential hypertension, benign 01/13/2008  .  PAROXYSMAL ATRIAL FIBRILLATION 01/13/2008  . Intracranial vascular stenosis 01/13/2008  . DIVERTICULOSIS OF COLON 01/13/2008  . Fibromyalgia 01/13/2008  . CHEST PAIN, ATYPICAL 01/13/2008  . Hypothyroidism 04/13/2007  . Poorly controlled type 2 diabetes mellitus with complication ([place]) 88/32/5498  . Vitamin D deficiency 04/13/2007  . Hyperlipidemia LDL goal <70 04/13/2007  . DEGENERATIVE JOINT DISEASE 04/13/2007     . Have you seen any other providers since your last  visit with PCP? No   . Any changes in your medications or health? Yes - Dr. Diona Browner started her on Trulicity 2/64/15, son states she possibly had a "mini stroke" over the weekend. They did not take her to the hospital to be evaluated.  . Any side effects from any medications? No  . Do you have an symptoms or problems not managed by your medications? No  . Any concerns about your health right now? Yes  . Has your provider asked that you check blood pressure, blood sugar, or follow special diet at home? Yes  . Do you get any type of exercise on a regular basis? No-   . Can you think of a goal you would like to reach for your health? Yes- improve BG  . Do you have any problems getting your medications? No o Patient's preferred pharmacy is:  [id number] [place], [place] [address] Phone: [phone] Fax: [phone]  [facility] [place], [place] [address] Phone: [phone] Fax: [phone]   . Is there anything that you would like to discuss during the appointment? Yes- ways to improve blood sugar.    Jaclyn [name] was reminded to have all medications, supplements and any blood glucose and blood pressure readings available for review with Jaclyn [name], [name], at her telephone visit on 04/23/20 at 2:00 .   Patient's son, Jaclyn [name] will be the contact for this appointment. He states he is getting off work early that day and will be at his mothers home. He can be reached at [phone].  Follow-Up:  Pharmacist Review  Jaclyn [name], [name] notified  Jaclyn [name], [place] [phone]  Total time spent for month: 25

## 2020-04-18 NOTE — Telephone Encounter (Signed)
Jaclyn [name] from [facility] called and wanted to verify if the patient was still on Atorvastatin 40 mg. Based on recent office note, patient was still taking medication. However, the order for the medication has expired. Attempted to contact patient without success. Is the patient to remain on this medication? Please advise.

## 2020-04-19 NOTE — Telephone Encounter (Signed)
Spoke with Vicente Males from [facility] and updated her of this information.

## 2020-04-23 ENCOUNTER — Ambulatory Visit (INDEPENDENT_AMBULATORY_CARE_PROVIDER_SITE_OTHER): Payer: Medicare Other

## 2020-04-23 ENCOUNTER — Other Ambulatory Visit: Payer: Self-pay

## 2020-04-23 DIAGNOSIS — E118 Type 2 diabetes mellitus with unspecified complications: Secondary | ICD-10-CM

## 2020-04-23 DIAGNOSIS — I10 Essential (primary) hypertension: Secondary | ICD-10-CM | POA: Diagnosis not present

## 2020-04-23 DIAGNOSIS — E1165 Type 2 diabetes mellitus with hyperglycemia: Secondary | ICD-10-CM

## 2020-04-23 DIAGNOSIS — E785 Hyperlipidemia, unspecified: Secondary | ICD-10-CM

## 2020-04-23 NOTE — Progress Notes (Signed)
Chronic Care Management Pharmacy Note  Name:  Jaclyn [name] MRN:  [id number] DOB:  1935/07/31  Subjective: Jaclyn [name] is an 85 y.o. year old female who is a primary patient of Bedsole, Amy E, MD.  The CCM team was consulted for assistance with disease management and care coordination needs.    Engaged with patient by telephone for initial visit in response to provider referral for pharmacy case management and/or care coordination services. Spoke with Jaclyn [name] and her son, Jaclyn [name].   Consent to Services:  The patient was given the following information about Chronic Care Management services today, agreed to services, and gave verbal consent: 1. CCM service includes personalized support from designated clinical staff supervised by the primary care provider, including individualized plan of care and coordination with other care providers 2. 24/7 contact phone numbers for assistance for urgent and routine care needs. 3. Service will only be billed when office clinical staff spend 20 minutes or more in a month to coordinate care. 4. Only one practitioner may furnish and bill the service in a calendar month. 5.The patient may stop CCM services at any time (effective at the end of the month) by phone call to the office staff. 6. The patient will be responsible for cost sharing (co-pay) of up to 20% of the service fee (after annual deductible is met). Patient agreed to services and consent obtained.  Patient Care Team: Jinny Sanders, MD as PCP - General (Family Medicine) Josue Hector, MD as PCP - Cardiology (Cardiology) [name], [facility] as Pharmacist (Pharmacist)  CCM consent 03/14/20  Recent office visits: 03/16/20 - PCP - DM,  Increase Lantus to 60 Units. Continue Januvia. Start Trulicity weekly  Injections. Check fasting blood sugar each morning.. record measurements and bring to next appt. 09/13/19 - PCP - DM, Use Lantus instead of Levemir given cost. Take Lantus 40 Units twice daily.  Stay off [place] and continue tradjenta. Given sliding scale to use for rapid acting insulin  Recent consult visits: 09/20/19 - Vascular surgery - Follow-up recent right carotid endarterectomy for symptomatic disease on 08/26/2019. She is here today with her granddaughter.  She reports no new neurologic deficits.  There is some question regarding blood pressure medications and specifically midodrine.  I explained that we would tolerate a rather wide range of blood pressure with anywhere from the low 100s up to 883 for systolic pressure.   Hospital visits: 08/30/20 - CVA   Objective:  Lab Results  Component Value Date   CREATININE 0.97 09/05/2019   BUN 11 09/05/2019   GFR 60.08 09/06/2018   GFRNONAA 54 (L) 09/05/2019   GFRAA >60 09/05/2019   NA 137 09/05/2019   K 4.2 09/05/2019   CALCIUM 9.2 09/05/2019   CO2 24 09/05/2019    Lab Results  Component Value Date/Time   HGBA1C 13.7 (A) 03/16/2020 03:43 PM   HGBA1C 14.2 (H) 08/22/2019 04:59 AM   HGBA1C 11.5 (H) 01/24/2019 07:17 PM   GFR 60.08 09/06/2018 03:48 PM   GFR 70.35 05/08/2017 01:40 PM   MICROALBUR 3.2 (H) 05/08/2017 01:49 PM   MICROALBUR 1.0 06/20/2008 11:12 AM    Last diabetic Eye exam:  Lab Results  Component Value Date/Time   HMDIABEYEEXA No Retinopathy 01/10/2014 12:00 AM    Last diabetic Foot exam:  Lab Results  Component Value Date/Time   HMDIABFOOTEX done 12/18/2017 12:00 AM    Lab Results  Component Value Date   CHOL 204 (H) 08/22/2019   HDL 33 (L) 08/22/2019  LDLCALC 103 (H) 08/22/2019   LDLDIRECT 146.0 09/06/2018   TRIG 338 (H) 08/22/2019   CHOLHDL 6.2 08/22/2019    Hepatic Function Latest Ref Rng & Units 09/01/2019 08/29/2019 08/27/2019  Total Protein 6.5 - 8.1 g/dL 5.9(L) 5.4(L) 5.2(L)  Albumin 3.5 - 5.0 g/dL 2.7(L) 2.7(L) 2.6(L)  AST 15 - 41 U/L '16 21 17  ' ALT 0 - 44 U/L '15 18 13  ' Alk Phosphatase 38 - 126 U/L 68 60 46  Total Bilirubin 0.3 - 1.2 mg/dL 0.6 0.7 0.6  Bilirubin, Direct 0.0 - 0.3 mg/dL - -  -    Lab Results  Component Value Date/Time   TSH 2.876 08/22/2019 11:37 AM   TSH 1.441 01/15/2019 06:25 AM   TSH 68.61 (H) 04/07/2016 05:42 PM   TSH 0.725 01/26/2015 05:00 PM   FREET4 1.23 01/26/2015 05:00 PM   FREET4 1.10 08/25/2013 09:42 AM    CBC Latest Ref Rng & Units 09/05/2019 09/01/2019 08/31/2019  WBC 4.0 - 10.5 K/uL 5.7 7.3 6.4  Hemoglobin 12.0 - 15.0 g/dL 10.6(L) 9.5(L) 9.1(L)  Hematocrit 36.0 - 46.0 % 32.5(L) 29.4(L) 28.3(L)  Platelets 150 - 400 K/uL 290 245 207    Lab Results  Component Value Date/Time   VD25OH 20.67 (L) 09/06/2018 03:48 PM   VD25OH 11 (L) 01/26/2015 05:00 PM   VD25OH 38 10/08/2012 11:48 AM    Clinical ASCVD: Yes  The ASCVD Risk score Mikey Bussing DC Jr., et al., 2013) failed to calculate for the following reasons:   The 2013 ASCVD risk score is only valid for ages 1 to 109   The patient has a prior MI or stroke diagnosis    Depression screen [facility] 2/9 09/10/2018 11/23/2015 10/08/2012  Decreased Interest 3 3 0  Down, Depressed, Hopeless '3 3 1  ' PHQ - 2 Score '6 6 1  ' Altered sleeping 0 1 -  Tired, decreased energy 3 3 -  Change in appetite 0 0 -  Feeling bad or failure about yourself  0 0 -  Trouble concentrating 0 1 -  Moving slowly or fidgety/restless 0 0 -  Suicidal thoughts 0 1 -  PHQ-9 Score 9 12 -  Difficult doing work/chores Somewhat difficult - -  Some recent data might be hidden     Social History   Tobacco Use  Smoking Status Never Smoker  Smokeless Tobacco Never Used   BP Readings from Last 3 Encounters:  03/16/20 120/70  09/20/19 122/70  09/13/19 119/68   Pulse Readings from Last 3 Encounters:  03/16/20 90  09/20/19 83  09/13/19 62   Wt Readings from Last 3 Encounters:  03/16/20 144 lb 4 oz (65.4 kg)  09/20/19 159 lb (72.1 kg)  09/13/19 159 lb (72.1 kg)    Assessment/Interventions: Review of patient past medical history, allergies, medications, health status, including review of consultants reports, laboratory and other  test data, was performed as part of comprehensive evaluation and provision of chronic care management services.   SDOH:  (Social Determinants of Health) assessments and interventions performed: Yes SDOH Interventions   Flowsheet Row Most Recent Value  SDOH Interventions   Financial Strain Interventions --  [Apply for assistance with medication cost]      CCM Care Plan  Allergies  Allergen Reactions  . Naproxen Sodium Other (See Comments)    Fever/aches and pains  . Statins Other (See Comments)    myalgias  . Sulfonamide Derivatives Hives and Itching  . Latex Itching and Rash  . Shellfish Allergy Itching, Swelling  and Rash    Seafood, shrimp    Medications Reviewed Today    Reviewed by Rosetta Posner, MD (Physician) on 09/20/19 at 1116  Med List Status: <None>  Medication Order Taking? Sig Documenting Provider Last Dose Status Informant  acetaminophen (TYLENOL) 325 MG tablet [id number]  Take 2 tablets (650 mg total) by mouth every 4 (four) hours as needed for mild pain (or temp > 37.5 C (99.5 F)). Arrien, Jimmy Picket, MD  Active   apixaban (ELIQUIS) 5 MG TABS tablet [id number] Yes Take 1 tablet (5 mg total) by mouth 2 (two) times daily. Jinny Sanders, MD Taking Active   aspirin EC 81 MG EC tablet [id number] Yes Take 1 tablet (81 mg total) by mouth daily. Swallow whole. Arrien, Jimmy Picket, MD Taking Active   atorvastatin (LIPITOR) 40 MG tablet [id number] Yes Take 1 tablet (40 mg total) by mouth daily. Cathlyn Parsons, PA-C Taking Active   B-D ULTRAFINE III SHORT PEN 31G X 8 MM MISC [id number] Yes USE TO INJECT INSULIN ONCE DAILY. DX: U88.28 Jinny Sanders, MD Taking Active Multiple Informants  DULoxetine (CYMBALTA) 30 MG capsule [id number] Yes Take 1 capsule (30 mg total) by mouth 2 (two) times daily. Jinny Sanders, MD Taking Active   insulin regular (HUMULIN R) 100 units/mL injection [id number] Yes Use sliding scale as directed. Jinny Sanders, MD Taking Active   LANTUS  SOLOSTAR 100 UNIT/ML Solostar Pen [id number] Yes Inject 40 Units into the skin daily.  Patient taking differently: Inject 40 Units into the skin 2 (two) times daily.    Jinny Sanders, MD Taking Active   levothyroxine (SYNTHROID) 112 MCG tablet [id number] Yes Take 1 tablet (112 mcg total) by mouth daily. Cathlyn Parsons, PA-C Taking Active   linagliptin (TRADJENTA) 5 MG TABS tablet [id number] No Take 1 tablet (5 mg total) by mouth daily.  Patient not taking: Reported on 09/20/2019   Jinny Sanders, MD Not Taking Active   midodrine (PROAMATINE) 5 MG tablet [id number] Yes Take 1 tablet (5 mg total) by mouth 2 (two) times daily with breakfast and lunch. Cathlyn Parsons, PA-C Taking Active   pantoprazole (PROTONIX) 40 MG tablet [id number] Yes Take 1 tablet (40 mg total) by mouth daily. Cathlyn Parsons, PA-C Taking Active   trimethoprim (TRIMPEX) 100 MG tablet [id number] Yes Take 1 tablet (100 mg total) by mouth daily. Cathlyn Parsons, PA-C Taking Active   vitamin B-12 (CYANOCOBALAMIN) 1000 MCG tablet [id number] Yes Take 1 tablet (1,000 mcg total) by mouth daily. Cathlyn Parsons, PA-C Taking Active   Vitamin D, Ergocalciferol, (DRISDOL) 1.25 MG (50000 UT) CAPS capsule [id number] Yes Take 1 capsule (50,000 Units total) by mouth every 7 (seven) days. Jinny Sanders, MD Taking Active Multiple Informants           Med Note Carter Kitten   Tue Sep 13, 2019  9:21 AM)            Patient Active Problem List   Diagnosis Date Noted  . Hypokalemia   . Labile blood glucose   . Uncontrolled type 2 diabetes mellitus with hyperglycemia ([place])   . Orthostatic hypotension   . Elevated blood pressure reading   . 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 Recurrent UTI   . Dyslipidemia   . Orthostasis   . Dysarthria   . Vagina, candidiasis 03/10/2019  . Dementia ([place]) 03/10/2019  . Acute respiratory failure with hypoxia ([place]) 01/24/2019  .  Acute metabolic encephalopathy 29/52/8413  .  COVID-19 virus infection 01/15/2019  . Acute lower UTI 01/15/2019  . B12 deficiency 01/22/2018  . History of CVA (cerebrovascular accident) 06/20/2017  . Stenosis of right carotid artery   . Peripheral edema 07/29/2016  . Imbalance 04/07/2016  . Intertriginous candidiasis 04/26/2015  . Hard of hearing 04/12/2015  . TIA (transient ischemic attack) 04/10/2015  . History of recurrent UTIs 04/10/2015  . Dysuria 03/09/2015  . Tremor 05/11/2014  . Leg weakness, bilateral 05/11/2014  . Fatty liver 08/04/2013  . Personal history of colonic polyps 10/05/2012  . Esophageal reflux 10/05/2012  . Carotid stenosis, symptomatic, with infarction ([place]) 10/14/2010  . ANXIETY DEPRESSION 01/13/2008  . Essential hypertension, benign 01/13/2008  . PAROXYSMAL ATRIAL FIBRILLATION 01/13/2008  . Intracranial vascular stenosis 01/13/2008  . DIVERTICULOSIS OF COLON 01/13/2008  . Fibromyalgia 01/13/2008  . CHEST PAIN, ATYPICAL 01/13/2008  . Hypothyroidism 04/13/2007  . Poorly controlled type 2 diabetes mellitus with complication ([place]) 24/40/1027  . Vitamin D deficiency 04/13/2007  . Hyperlipidemia LDL goal <70 04/13/2007  . DEGENERATIVE JOINT DISEASE 04/13/2007    Immunization History  Administered Date(s) Administered  . Fluad Quad(high Dose 65+) 01/20/2019  . Influenza Split 02/03/2011, 01/20/2012  . Influenza Whole 11/12/2007, 12/18/2008  . Influenza,inj,Quad PF,6+ Mos 11/09/2014, 11/23/2015  . Pneumococcal Conjugate-13 03/09/2015  . Pneumococcal Polysaccharide-23 10/19/2007  . Tdap 06/02/2011    Conditions to be addressed/monitored:  Hypertension, Hyperlipidemia, Diabetes, Atrial Fibrillation and Hypothyroidism  Care Plan : [place]  Updates made by [name], [place] since 05/01/2020 12:00 AM    Problem: CHL AMB "PATIENT-SPECIFIC PROBLEM"     Long-Range Goal: Disease Mnagement   Start Date: 04/23/2020  Priority: High  Note:   Current Barriers:  . Unable to independently  afford treatment regimen  . Unable to achieve control of diabetes - has not started Trulicity   Pharmacist Clinical Goal(s):  Marland Kitchen Over the next 30 days, patient will verbalize ability to afford treatment regimen . achieve adherence to monitoring guidelines and medication adherence to achieve therapeutic efficacy through collaboration with PharmD and provider.    Interventions: . 1:1 collaboration with Jinny Sanders, MD regarding development and update of comprehensive plan of care as evidenced by provider attestation and co-signature . Inter-disciplinary care team collaboration (see longitudinal plan of care) . Comprehensive medication review performed; medication list updated in electronic medical record  Hypertension (BP goal <140/90) -Controlled -Current treatment:   No pharmacotherapy -Medications previously tried: Midodrine 5 mg - 1 BID breakfast and lunch (pt reports stopped per vascular surgery) -Current home readings: none -She does have a home BP monitor with wrist cuff, has not checked recently. -Denies hypotensive/hypertensive symptoms - occasional dizziness upon standing  -Educated on Importance of home blood pressure monitoring; Proper BP monitoring technique; -Counseled to monitor BP at home with symptoms of low BP (arm cuff recommended), document, and provide log at future appointments  Hyperlipidemia: (LDL goal < 70, h/o CVA) -Not ideally controlled - due for lipid panel -Current treatment: . Atorvastatin 40 mg - 1 tablet daily . Aspirin 81 mg - 1 tablet daily (not taking) -Medications previously tried: history of statin myalgia -Educated on Benefits of statin for ASCVD risk reduction;  No refill history, but pt confirms taking. Statin started 08/24/2019. Per discharge note CVA 08/2019, patient is to continue statin, aspirin and Eliquis. -Recommended to continue current medication; Encourage adherence to statin and aspirin.  Diabetes (A1c goal  <8%) -Uncontrolled -Current medications: . Trulicity 2.53  mg - Inject once weekly . Januvia 100 mg - 1 tablet daily . Lantus Solostar - 60 units daily (time of day varies - usually around 10 AM) -Medications previously tried: metformin - CKD, Humulin -Patient did not start Trulicity due to concerns about thyroid cancer.  Discussed concerns, pt agrees to try.  -Current home glucose readings - never drops under 300, dropped under 300 once last week - 285 -Reports hyperglycemic symptoms - poor energy. Wakes up at 3 AM, unable to go back to sleep for 1-2 hours.  -Current exercise: minimal  -Educated onA1c and blood sugar goals; Complications of diabetes including kidney damage, retinal damage, and cardiovascular disease; Benefits of routine self-monitoring of blood sugar; -Counseled to check feet daily and get yearly eye exams -Pt would like to take her Lantus twice daily, states it works better for her this way.   -Recommended to continue current medication; Encouraged patient to try Trulicity 5.40 mg weekly. Call if any adverse effects.   Atrial Fibrillation (Goal: prevent stroke and major bleeding) -Controlled -CHADSVASC: 7  -Current treatment: . Rate control: none . Anticoagulation: Eliquis 5 mg - 1 tablet BID -Medications previously tried: none -Home BP and HR readings: none reported   -Counseled on increased risk of stroke due to Afib and benefits of anticoagulation for stroke prevention; importance of adherence to anticoagulant exactly as prescribed; -Recommended to continue current medication  Thryoid (Goal: TSH WNL) -Controlled -Current treatment  . Levothyroxine 112 mcg - 1 tablet daily  -Medications previously tried: none  -Recommended to continue current medication   Vit D Deficiency (Goal: Vit D WNL) -Query uncontrolled - last vit D < 30, prescribed 50,000 units weekly -Current treatment  . No pharmacotherapy  -Recommend daily vitamin D3 1000 units due to history of  insufficiency.   Patient Goals/Self-Care Activities . Over the next 90 days, patient will:  - take medications as prescribed check glucose daily, document, and provide at future appointments check blood pressure with symptoms, document, and provide at future appointments      Medication Assistance: Unable to afford medications - Mail applications for Trulicity, Lantus, and Eliquis PAP. Offered appointment in person, but patient declined. Difficulty with transportation as son works full time.  Patient's preferred pharmacy [id number] [place] [id number]: [phone]: [phone] Uses pill box? No - uses a shoe box and moves medications to one side once dose is taken  Using [facility] for all medications. Denies concerns with pick up. Adherence concerns - no refill history for the following - duloxetine, atorvastatin  We discussed: Benefits of medication synchronization, packaging and delivery as well as enhanced pharmacist oversight with [employer]. Patient decided to: Continue current medication management strategy  Care Plan and Follow Up Patient Decision:  Patient agrees to Care Plan and Follow-up.  Plan: Telephone follow up appointment with care management team member scheduled for:  30 days Recommend she schedule an appointment with PCP as she missed last 2 week follow up.  MDebbora Dus PharmD Clinical Pharmacist [phone]

## 2020-04-26 ENCOUNTER — Telehealth: Payer: Self-pay

## 2020-04-26 NOTE — Chronic Care Management (AMB) (Addendum)
    Chronic Care Management Pharmacy Assistant   Name: Jaclyn [name]  MRN: [id number] DOB: [birth year]   Reason for Encounter: Patient Assistance Application. Trulicity, Lantus, Eliquis    Medications: Outpatient Encounter Medications as of 04/26/2020  Medication Sig   apixaban (ELIQUIS) 5 MG TABS tablet Take 1 tablet (5 mg total) by mouth 2 (two) times daily.   aspirin EC 81 MG EC tablet Take 1 tablet (81 mg total) by mouth daily. Swallow whole.   atorvastatin (LIPITOR) 40 MG tablet Take 1 tablet (40 mg total) by mouth daily.   B-D ULTRAFINE III SHORT PEN 31G X 8 MM MISC USE TO INJECT INSULIN ONCE DAILY. DX: E11.65   Dulaglutide (TRULICITY) 0.34 JZ/7.9XT SOPN Inject 0.75 mg into the skin once a week.   DULoxetine (CYMBALTA) 30 MG capsule TAKE 2 CAPSULES BY MOUTH EVERY DAY   insulin regular (HUMULIN R) 100 units/mL injection Use sliding scale as directed.   JANUVIA 100 MG tablet TAKE 1 TABLET BY MOUTH EVERY DAY   LANTUS SOLOSTAR 100 UNIT/ML Solostar Pen Inject 40 Units into the skin daily.   levothyroxine (SYNTHROID) 112 MCG tablet Take 1 tablet (112 mcg total) by mouth daily.   midodrine (PROAMATINE) 5 MG tablet Take 1 tablet (5 mg total) by mouth 2 (two) times daily with breakfast and lunch. (Patient not taking: Reported on 04/23/2020)   pantoprazole (PROTONIX) 40 MG tablet Take 1 tablet (40 mg total) by mouth daily.   trimethoprim (TRIMPEX) 100 MG tablet Take 1 tablet (100 mg total) by mouth daily.   vitamin B-12 (CYANOCOBALAMIN) 1000 MCG tablet Take 1 tablet (1,000 mcg total) by mouth daily.   Vitamin D, Ergocalciferol, (DRISDOL) 1.25 MG (50000 UT) CAPS capsule Take 1 capsule (50,000 Units total) by mouth every 7 (seven) days.   No facility-administered encounter medications on file as of 04/26/2020.   Per patient request, applications for Trulicity, Eliquis and Lantus have been completed and uploaded for Sharyn Lull to review and mail to patient. Will follow up on approval status  05/14/20.   Follow-Up:  Patient Assistance Coordination and Pharmacist Review  [name], [name] notified  [name], [place] Pharmacy Assistant [phone]

## 2020-04-29 NOTE — Assessment & Plan Note (Signed)
Family assisting with care and present at appt.

## 2020-04-29 NOTE — Assessment & Plan Note (Signed)
Followed by Dr. Donnetta Hutching..  09/20/2019( reviewed note in detail)

## 2020-04-29 NOTE — Assessment & Plan Note (Signed)
Appreciate CCm pharmacist assistance.   Increase Lantus to 60 Units.  Continue Januvia.  Start Trulicity weekly  Injections.  Check fasting blood sugar each morning.. record measurements and bring to next appt... re-eval in 3 months.

## 2020-05-01 NOTE — Telephone Encounter (Signed)
Reviewed forms and sent to [employer] for mailing to patient.   Ria Comment, Please let patient know these have been mailed. Let her know to bring forms back to office once signed. She will need a proof of out of pocket expense report for the Eliquis from her pharmacy. (She can ask her pharmacy for dispensing record for 2022). Proof of income not required.   [name], PharmD Clinical Pharmacist [place] [facility] at [facility] [phone]

## 2020-05-01 NOTE — Progress Notes (Signed)
Encounter details: CCM Time Spent       Value Time User   Time spent with patient (minutes)  150 05/01/2020  3:15 PM [name], [facility]   Time spent performing Chart review  30 05/01/2020  3:15 PM [name], [facility]   Total time (minutes)  180 05/01/2020  3:15 PM [name], RPH      Moderate to High Complex Decision Making       Value Time User   Moderate to High complex decision making  Yes 05/01/2020  3:15 PM [name], [facility]      CCM Services: This encounter meets complex CCM services and moderate to high decision making.  Prior to outreach and patient consent for Chronic Care Management, I referred this patient for services after reviewing the nominated patient list or from a personal encounter with the patient.  I have personally reviewed this encounter including the documentation in this note and have collaborated with the care management provider regarding care management and care coordination activities to include development and update of the comprehensive care plan. I am certifying that I agree with the content of this note and encounter as supervising physician.

## 2020-05-01 NOTE — Chronic Care Management (AMB) (Signed)
Contacted patient regarding patient assistance applications. Patient's son, [name] answered. Explained that they would need expense report from the pharmacy for the Eliquis application. Asked that once she receives the applications and completes them to mail them back to [facility]. He agreed to this. Will follow up on approvals 05/14/20.   Follow-Up:  Patient Assistance Coordination and Pharmacist Review  [name], [name] notified  [name], [place] [phone]  Total time spent for month: 1:25:00

## 2020-05-01 NOTE — Patient Instructions (Addendum)
Dear Jaclyn [name],  It was a pleasure meeting you during our initial appointment on April 23, 2020. Below is a summary of the goals we discussed and components of chronic care management. Please contact me anytime with questions or concerns.   Visit Information  Patient Care Plan: CCM Pharmacy Care Plan    Problem Identified: [facility] AMB "PATIENT-SPECIFIC PROBLEM"     Long-Range Goal: Disease Mnagement   Start Date: 04/23/2020  Priority: High  Note:   Current Barriers:  . Unable to independently afford treatment regimen  . Unable to achieve control of diabetes - has not started Trulicity   Pharmacist Clinical Goal(s):  Marland Kitchen Over the next 30 days, patient will verbalize ability to afford treatment regimen . achieve adherence to monitoring guidelines and medication adherence to achieve therapeutic efficacy through collaboration with PharmD and provider.    Interventions: . 1:1 collaboration with Jinny Sanders, MD regarding development and update of comprehensive plan of care as evidenced by provider attestation and co-signature . Inter-disciplinary care team collaboration (see longitudinal plan of care) . Comprehensive medication review performed; medication list updated in electronic medical record  Hypertension (BP goal <140/90) -Controlled -Current treatment:   No pharmacotherapy -Medications previously tried: Midodrine 5 mg - 1 BID breakfast and lunch (pt reports stopped per vascular surgery) -Current home readings: none -She does have a home BP monitor with wrist cuff, has not checked recently. -Denies hypotensive/hypertensive symptoms - occasional dizziness upon standing  -Educated on Importance of home blood pressure monitoring; Proper BP monitoring technique; -Counseled to monitor BP at home with symptoms of low BP (arm cuff recommended), document, and provide log at future appointments  Hyperlipidemia: (LDL goal < 70, h/o CVA) -Not ideally controlled - due for lipid  panel -Current treatment: . Atorvastatin 40 mg - 1 tablet daily . Aspirin 81 mg - 1 tablet daily (not taking) -Medications previously tried: history of statin myalgia -Educated on Benefits of statin for ASCVD risk reduction;  No refill history, but pt confirms taking. Statin started 08/24/2019. Per discharge note CVA 08/2019, patient is to continue statin, aspirin and Eliquis. -Recommended to continue current medication; Encourage adherence to statin and aspirin.  Diabetes (A1c goal <8%) -Uncontrolled -Current medications: . Trulicity 2.99 mg - Inject once weekly . Januvia 100 mg - 1 tablet daily . Lantus Solostar - 60 units daily (time of day varies - usually around 10 AM) -Medications previously tried: metformin - CKD, Humulin -Patient did not start Trulicity due to concerns about thyroid cancer.  Discussed concerns, pt agrees to try.  -Current home glucose readings - never drops under 300, dropped under 300 once last week - 285 -Reports hyperglycemic symptoms - poor energy. Wakes up at 3 AM, unable to go back to sleep for 1-2 hours.  -Current exercise: minimal  -Educated onA1c and blood sugar goals; Complications of diabetes including kidney damage, retinal damage, and cardiovascular disease; Benefits of routine self-monitoring of blood sugar; -Counseled to check feet daily and get yearly eye exams -Pt would like to take her Lantus twice daily, states it works better for her this way.   -Recommended to continue current medication; Encouraged patient to try Trulicity 3.71 mg weekly. Call if any adverse effects.   Atrial Fibrillation (Goal: prevent stroke and major bleeding) -Controlled -CHADSVASC: 7  -Current treatment: . Rate control: none . Anticoagulation: Eliquis 5 mg - 1 tablet BID -Medications previously tried: none -Home BP and HR readings: none reported   -Counseled on increased risk of stroke due  to Afib and benefits of anticoagulation for stroke prevention; importance  of adherence to anticoagulant exactly as prescribed; -Recommended to continue current medication  Thryoid (Goal: TSH WNL) -Controlled -Current treatment  . Levothyroxine 112 mcg - 1 tablet daily  -Medications previously tried: none  -Recommended to continue current medication   Vit D Deficiency (Goal: Vit D WNL) -Query uncontrolled - last vit D < 30, prescribed 50,000 units weekly -Current treatment  . No pharmacotherapy  -Recommend daily vitamin D3 1000 units due to history of insufficiency.   Patient Goals/Self-Care Activities . Over the next 90 days, patient will:  - take medications as prescribed check glucose daily, document, and provide at future appointments check blood pressure with symptoms, document, and provide at future appointments      The patient verbalized understanding of instructions, educational materials, and care plan provided today and agreed to receive a mailed copy of patient instructions, educational materials, and care plan.  Telephone follow up appointment with pharmacy team member scheduled for: May 21, 2020 at 1 PM (PHONE)  [name], [facility]   Diabetes Mellitus and Standards of [place] with and managing diabetes (diabetes mellitus) can be complicated. Your diabetes treatment may be managed by a team of health care providers, including:  A physician who specializes in diabetes (endocrinologist). You might also have visits with a nurse practitioner or physician assistant.  Nurses.  A registered dietitian.  A certified diabetes care and education specialist.  An exercise specialist.  A pharmacist.  An eye doctor.  A foot specialist (podiatrist).  A dental care provider.  A primary care provider.  A mental health care provider. How to manage your diabetes You can do many things to successfully manage your diabetes. Your health care providers will follow guidelines to help you get the best quality of care. Here are general  guidelines for your diabetes management plan. Your health care providers may give you more specific instructions. Physical exams When you are diagnosed with diabetes, and each year after that, your health care provider will ask about your medical and family history. You will have a physical exam, which may include:  Measuring your height, weight, and body mass index (BMI).  Checking your blood pressure. This will be done at every routine medical visit. Your target blood pressure may vary depending on your medical conditions, your age, and other factors.  A thyroid exam.  A skin exam.  Screening for nerve damage (peripheral neuropathy). This may include checking the pulse in your legs and feet and the level of sensation in your hands and feet.  A foot exam to inspect the structure and skin of your feet, including checking for cuts, bruises, redness, blisters, sores, or other problems.  Screening for blood vessel (vascular) problems. This may include checking the pulse in your legs and feet and checking your temperature. Blood tests Depending on your treatment plan and your personal needs, you may have the following tests:  Hemoglobin A1C (HbA1C). This test provides information about blood sugar (glucose) control over the previous 2-3 months. It is used to adjust your treatment plan, if needed. This test will be done: ? At least 2 times a year, if you are meeting your treatment goals. ? 4 times a year, if you are not meeting your treatment goals or if your goals have changed.  Lipid testing, including total cholesterol, LDL and HDL cholesterol, and triglyceride levels. ? The goal for LDL is less than 100 mg/dL (5.5 mmol/L). If you  are at high risk for complications, the goal is less than 70 mg/dL (3.9 mmol/L). ? The goal for HDL is 40 mg/dL (2.2 mmol/L) or higher for men, and 50 mg/dL (2.8 mmol/L) or higher for women. An HDL cholesterol of 60 mg/dL (3.3 mmol/L) or higher gives some protection  against heart disease. ? The goal for triglycerides is less than 150 mg/dL (8.3 mmol/L).  Liver function tests.  Kidney function tests.  Thyroid function tests.   Dental and eye exams  Visit your dentist two times a year.  If you have type 1 diabetes, your health care provider may recommend an eye exam within 5 years after you are diagnosed, and then once a year after your first exam. ? For children with type 1 diabetes, the health care provider may recommend an eye exam when your child is age 19 or older and has had diabetes for 3-5 years. After the first exam, your child should get an eye exam once a year.  If you have type 2 diabetes, your health care provider may recommend an eye exam as soon as you are diagnosed, and then every 1-2 years after your first exam.   Immunizations  A yearly flu (influenza) vaccine is recommended annually for everyone 6 months or older. This is especially important if you have diabetes.  The pneumonia (pneumococcal) vaccine is recommended for everyone 2 years or older who has diabetes. If you are age 62 or older, you may get the pneumonia vaccine as a series of two separate shots.  The hepatitis B vaccine is recommended for adults shortly after being diagnosed with diabetes. Adults and children with diabetes should receive all other vaccines according to age-specific recommendations from the Centers for Disease Control and Prevention (CDC). Mental and emotional health Screening for symptoms of eating disorders, anxiety, and depression is recommended at the time of diagnosis and after as needed. If your screening shows that you have symptoms, you may need more evaluation. You may work with a mental health care provider. Follow these instructions at home: Treatment plan You will monitor your blood glucose levels and may give yourself insulin. Your treatment plan will be reviewed at every medical visit. You and your health care provider will discuss:  How  you are taking your medicines, including insulin.  Any side effects you have.  Your blood glucose level target goals.  How often you monitor your blood glucose level.  Lifestyle habits, such as activity level and tobacco, alcohol, and substance use. Education Your health care provider will assess how well you are monitoring your blood glucose levels and whether you are taking your insulin and medicines correctly. He or she may refer you to:  A certified diabetes care and education specialist to manage your diabetes throughout your life, starting at diagnosis.  A registered dietitian who can create and review your personal nutrition plan.  An exercise specialist who can discuss your activity level and exercise plan. General instructions  Take over-the-counter and prescription medicines only as told by your health care provider.  Keep all follow-up visits. This is important. Where to find support There are many diabetes support networks, including:  American Diabetes Association (ADA): diabetes.org  Defeat Diabetes Foundation: defeatdiabetes.org Where to find more information  American Diabetes Association (ADA): www.diabetes.org  Association of Diabetes Care & Education Specialists (ADCES): diabeteseducator.org  International Diabetes Federation (IDF): https://www.munoz-bell.org/ Summary  Managing diabetes (diabetes mellitus) can be complicated. Your diabetes treatment may be managed by a team of health care  providers.  Your health care providers follow guidelines to help you get the best quality care.  You should have physical exams, blood tests, blood pressure monitoring, immunizations, and screening tests regularly. Stay updated on how to manage your diabetes.  Your health care providers may also give you more specific instructions based on your individual health. This information is not intended to replace advice given to you by your health care provider. Make sure you discuss any  questions you have with your health care provider. Document Revised: 08/11/2019 Document Reviewed: 08/11/2019 Elsevier Patient Education  [place].

## 2020-05-21 ENCOUNTER — Telehealth: Payer: Medicare Other

## 2020-05-21 ENCOUNTER — Telehealth: Payer: Self-pay

## 2020-05-21 NOTE — Telephone Encounter (Signed)
Spoke with patient's son, [name], today for update on patient adherence to Trulicity and PAP status.  Reports she is not taking Trulicity. She does not want to try Trulicity despite discussion of risks/benefits. Pt is splitting her insulin into twice daily dosing - 40 units in morning and 30 units in the evening. She does not have a BG log to report today. Pt received PAP forms for Lantus, Eliquis, and Trulicity but has not completed them yet. Denies any questions about forms. Son reports pt was overwhelmed by quantity of forms. Let him know they can bring these in to the office if they need assistance. Encouraged scheduling a PCP follow up visit. Resources available for transportation if needed.  Plan: CMA follow up in 1 month to review BG and BP logs, assess status of PAPs, adherence review - Trulicity.  [name], PharmD Clinical Pharmacist [facility] [phone]

## 2020-05-21 NOTE — Progress Notes (Deleted)
Chronic Care Management Pharmacy Note  Name:  Jaclyn [name] MRN:  [id number] DOB:  [birth year]  Subjective: Jaclyn [name] is an 85 y.o. year old female who is a primary patient of Bedsole, Amy E, MD.  The CCM team was consulted for assistance with disease management and care coordination needs.    Engaged with patient by telephone for initial visit in response to provider referral for pharmacy case management and/or care coordination services. Spoke with Merrill and her son, [name].   Consent to Services:  The patient was given the following information about Chronic Care Management services today, agreed to services, and gave verbal consent: 1. CCM service includes personalized support from designated clinical staff supervised by the primary care provider, including individualized plan of care and coordination with other care providers 2. 24/7 contact phone numbers for assistance for urgent and routine care needs. 3. Service will only be billed when office clinical staff spend 20 minutes or more in a month to coordinate care. 4. Only one practitioner may furnish and bill the service in a calendar month. 5.The patient may stop CCM services at any time (effective at the end of the month) by phone call to the office staff. 6. The patient will be responsible for cost sharing (co-pay) of up to 20% of the service fee (after annual deductible is met). Patient agreed to services and consent obtained.  Patient Care Team: Jinny Sanders, MD as PCP - General (Family Medicine) Josue Hector, MD as PCP - Cardiology (Cardiology) [name], [facility] as Pharmacist (Pharmacist)  CCM consent 03/14/20  Recent office visits: 04/23/20 - CCM - Encouraged adherence to meds, pt to start taking Trulicity as prescribed. Mailed applications for patient assistance (Trulicity, Lantus, and Eliquis). Recommended scheduling PCP visiot.  03/16/20 - PCP - DM,  Increase Lantus to 60 Units. Continue Januvia. Start Trulicity  weekly  Injections. Check fasting blood sugar each morning.. record measurements and bring to next appt. 09/13/19 - PCP - DM, Use Lantus instead of Levemir given cost. Take Lantus 40 Units twice daily. Stay off [place] and continue tradjenta. Given sliding scale to use for rapid acting insulin  Recent consult visits: 09/20/19 - Vascular surgery - Follow-up recent right carotid endarterectomy for symptomatic disease on 08/26/2019. She is here today with her granddaughter.  She reports no new neurologic deficits.  There is some question regarding blood pressure medications and specifically midodrine.  I explained that we would tolerate a rather wide range of blood pressure with anywhere from the low 100s up to 196 for systolic pressure.   Hospital visits: 08/30/20 - CVA   Objective:  Lab Results  Component Value Date   CREATININE 0.97 09/05/2019   BUN 11 09/05/2019   GFR 60.08 09/06/2018   GFRNONAA 54 (L) 09/05/2019   GFRAA >60 09/05/2019   NA 137 09/05/2019   K 4.2 09/05/2019   CALCIUM 9.2 09/05/2019   CO2 24 09/05/2019    Lab Results  Component Value Date/Time   HGBA1C 13.7 (A) 03/16/2020 03:43 PM   HGBA1C 14.2 (H) 08/22/2019 04:59 AM   HGBA1C 11.5 (H) 01/24/2019 07:17 PM   GFR 60.08 09/06/2018 03:48 PM   GFR 70.35 05/08/2017 01:40 PM   MICROALBUR 3.2 (H) 05/08/2017 01:49 PM   MICROALBUR 1.0 06/20/2008 11:12 AM    Last diabetic Eye exam:  Lab Results  Component Value Date/Time   HMDIABEYEEXA No Retinopathy 01/10/2014 12:00 AM    Last diabetic Foot exam:  Lab Results  Component Value  Date/Time   HMDIABFOOTEX done 12/18/2017 12:00 AM    Lab Results  Component Value Date   CHOL 204 (H) 08/22/2019   HDL 33 (L) 08/22/2019   LDLCALC 103 (H) 08/22/2019   LDLDIRECT 146.0 09/06/2018   TRIG 338 (H) 08/22/2019   CHOLHDL 6.2 08/22/2019    Hepatic Function Latest Ref Rng & Units 09/01/2019 08/29/2019 08/27/2019  Total Protein 6.5 - 8.1 g/dL 5.9(L) 5.4(L) 5.2(L)  Albumin 3.5 - 5.0  g/dL 2.7(L) 2.7(L) 2.6(L)  AST 15 - 41 U/L _0 ALT 0 - 44 U/L _1 Alk Phosphatase 38 - 126 U/L 68 60 46  Total Bilirubin 0.3 - 1.2 mg/dL 0.6 0.7 0.6  Bilirubin, Direct 0.0 - 0.3 mg/dL - - -    Lab Results  Component Value Date/Time   TSH 2.876 08/22/2019 11:37 AM   TSH 1.441 01/15/2019 06:25 AM   TSH 68.61 (H) 04/07/2016 05:42 PM   TSH 0.725 01/26/2015 05:00 PM   FREET4 1.23 01/26/2015 05:00 PM   FREET4 1.10 08/25/2013 09:42 AM    CBC Latest Ref Rng & Units 09/05/2019 09/01/2019 08/31/2019  WBC 4.0 - 10.5 K/uL 5.7 7.3 6.4  Hemoglobin 12.0 - 15.0 g/dL 10.6(L) 9.5(L) 9.1(L)  Hematocrit 36.0 - 46.0 % 32.5(L) 29.4(L) 28.3(L)  Platelets 150 - 400 K/uL 290 245 207    Lab Results  Component Value Date/Time   VD25OH 20.67 (L) 09/06/2018 03:48 PM   VD25OH 11 (L) 01/26/2015 05:00 PM   VD25OH 38 10/08/2012 11:48 AM    Clinical ASCVD: Yes  The ASCVD Risk score Mikey Bussing DC Jr., et al., 2013) failed to calculate for the following reasons:   The 2013 ASCVD risk score is only valid for ages 39 to 46   The patient has a prior MI or stroke diagnosis    Depression screen [facility] 2/9 09/10/2018 11/23/2015 10/08/2012  Decreased Interest 3 3 0  Down, Depressed, Hopeless _2 PHQ - 2 Score _3 Altered sleeping 0 1 -  Tired, decreased energy 3 3 -  Change in appetite 0 0 -  Feeling bad or failure about yourself  0 0 -  Trouble concentrating 0 1 -  Moving slowly or fidgety/restless 0 0 -  Suicidal thoughts 0 1 -  PHQ-9 Score 9 12 -  Difficult doing work/chores Somewhat difficult - -  Some recent data might be hidden     Social History   Tobacco Use  Smoking Status Never Smoker  Smokeless Tobacco Never Used   BP Readings from Last 3 Encounters:  03/16/20 120/70  09/20/19 122/70  09/13/19 119/68   Pulse Readings from Last 3 Encounters:  03/16/20 90  09/20/19 83  09/13/19 62   Wt Readings from Last 3 Encounters:  03/16/20 144 lb 4 oz (65.4 kg)  09/20/19 159 lb (72.1 kg)   09/13/19 159 lb (72.1 kg)    Assessment/Interventions: Review of patient past medical history, allergies, medications, health status, including review of consultants reports, laboratory and other test data, was performed as part of comprehensive evaluation and provision of chronic care management services.   SDOH:  (Social Determinants of Health) assessments and interventions performed: Yes   CCM Care Plan  Allergies  Allergen Reactions  . Naproxen Sodium Other (See Comments)    Fever/aches and pains  . Statins Other (See Comments)    myalgias  . Sulfonamide Derivatives Hives and Itching  . Latex Itching and Rash  . Shellfish  Allergy Itching, Swelling and Rash    Seafood, shrimp    Medications Reviewed Today    Reviewed by [name], [facility] (Pharmacist) on 05/01/20 at 1511  Med List Status: <None>  Medication Order Taking? Sig Documenting Provider Last Dose Status Informant  apixaban (ELIQUIS) 5 MG TABS tablet [id number] Yes Take 1 tablet (5 mg total) by mouth 2 (two) times daily. Jinny Sanders, MD Taking Active   aspirin EC 81 MG EC tablet [id number] No Take 1 tablet (81 mg total) by mouth daily. Swallow whole.  Patient not taking: Reported on 05/01/2020   Arrien, Jimmy Picket, MD Not Taking Active   atorvastatin (LIPITOR) 40 MG tablet [id number] Yes Take 1 tablet (40 mg total) by mouth daily. Cathlyn Parsons, PA-C Taking Expired 10/08/19 2359   B-D ULTRAFINE III SHORT PEN 31G X 8 MM MISC [id number] Yes USE TO INJECT INSULIN ONCE DAILY. DX: E11.65 Jinny Sanders, MD Taking Active Multiple Informants  Dulaglutide (TRULICITY) 9.16 BW/4.6KZ SOPN [id number] No Inject 0.75 mg into the skin once a week.  Patient not taking: Reported on 05/01/2020   Jinny Sanders, MD Not Taking Active   DULoxetine (CYMBALTA) 30 MG capsule [id number] Yes TAKE 2 CAPSULES BY MOUTH EVERY DAY Diona Browner, Amy E, MD Taking Active   JANUVIA 100 MG tablet [id number] Yes TAKE 1 TABLET BY MOUTH EVERY DAY Bedsole,  Amy E, MD Taking Active   LANTUS SOLOSTAR 100 UNIT/ML Solostar Pen [id number] Yes Inject 40 Units into the skin daily.  Patient taking differently: Inject 60 Units into the skin daily.   Jinny Sanders, MD Taking Active Self  levothyroxine (SYNTHROID) 112 MCG tablet [id number] Yes Take 1 tablet (112 mcg total) by mouth daily. Cathlyn Parsons, PA-C Taking Active   pantoprazole (PROTONIX) 40 MG tablet [id number] Yes Take 1 tablet (40 mg total) by mouth daily. Cathlyn Parsons, PA-C Taking Active   trimethoprim (TRIMPEX) 100 MG tablet [id number] Yes Take 1 tablet (100 mg total) by mouth daily. Cathlyn Parsons, PA-C Taking Active   vitamin B-12 (CYANOCOBALAMIN) 1000 MCG tablet [id number] Yes Take 1 tablet (1,000 mcg total) by mouth daily. Cathlyn Parsons, PA-C Taking Active           Patient Active Problem List   Diagnosis Date Noted  . Hypokalemia   . Labile blood glucose   . Uncontrolled type 2 diabetes mellitus with hyperglycemia ([place])   . Orthostatic hypotension   . Elevated blood pressure reading   . 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 Recurrent UTI   . Dyslipidemia   . Orthostasis   . Dysarthria   . Vagina, candidiasis 03/10/2019  . Dementia ([place]) 03/10/2019  . Acute respiratory failure with hypoxia ([place]) 01/24/2019  . Acute metabolic encephalopathy 68/04/2120  . COVID-19 virus infection 01/15/2019  . Acute lower UTI 01/15/2019  . B12 deficiency 01/22/2018  . History of CVA (cerebrovascular accident) 06/20/2017  . Stenosis of right carotid artery   . Peripheral edema 07/29/2016  . Imbalance 04/07/2016  . Intertriginous candidiasis 04/26/2015  . Hard of hearing 04/12/2015  . TIA (transient ischemic attack) 04/10/2015  . History of recurrent UTIs 04/10/2015  . Dysuria 03/09/2015  . Tremor 05/11/2014  . Leg weakness, bilateral 05/11/2014  . Fatty liver 08/04/2013  . Personal history of colonic polyps 10/05/2012  . Esophageal reflux 10/05/2012  .  Carotid stenosis, symptomatic, with infarction ([place]) 10/14/2010  . ANXIETY DEPRESSION 01/13/2008  . Essential hypertension, benign 01/13/2008  .  PAROXYSMAL ATRIAL FIBRILLATION 01/13/2008  . Intracranial vascular stenosis 01/13/2008  . DIVERTICULOSIS OF COLON 01/13/2008  . Fibromyalgia 01/13/2008  . CHEST PAIN, ATYPICAL 01/13/2008  . Hypothyroidism 04/13/2007  . Poorly controlled type 2 diabetes mellitus with complication ([place]) 55/97/4163  . Vitamin D deficiency 04/13/2007  . Hyperlipidemia LDL goal <70 04/13/2007  . DEGENERATIVE JOINT DISEASE 04/13/2007    Immunization History  Administered Date(s) Administered  . Fluad Quad(high Dose 65+) 01/20/2019  . Influenza Split 02/03/2011, 01/20/2012  . Influenza Whole 11/12/2007, 12/18/2008  . Influenza,inj,Quad PF,6+ Mos 11/09/2014, 11/23/2015  . Pneumococcal Conjugate-13 03/09/2015  . Pneumococcal Polysaccharide-23 10/19/2007  . Tdap 06/02/2011    Conditions to be addressed/monitored:  Hypertension, Hyperlipidemia, Diabetes, Atrial Fibrillation and Hypothyroidism  There are no care plans that you recently modified to display for this patient.   Current Barriers:   Unable to independently afford treatment regimen   Unable to achieve control of diabetes - has not started Trulicity   Pharmacist Clinical Goal(s):   Over the next 30 days, patient will verbalize ability to afford treatment regimen  achieve adherence to monitoring guidelines and medication adherence to achieve therapeutic efficacy through collaboration with PharmD and provider.    Interventions:  1:1 collaboration with Jinny Sanders, MD regarding development and update of comprehensive plan of care as evidenced by provider attestation and co-signature  Inter-disciplinary care team collaboration (see longitudinal plan of care)  Comprehensive medication review performed; medication list updated in electronic medical record  Hypertension (BP goal  <140/90) -Controlled -Current treatment:   No pharmacotherapy -Medications previously tried: Midodrine 5 mg - 1 BID breakfast and lunch (pt reports stopped per vascular surgery) -Current home readings: none -She does have a home BP monitor with wrist cuff, has not checked recently. -Denies hypotensive/hypertensive symptoms - occasional dizziness upon standing  -Educated on Importance of home blood pressure monitoring; Proper BP monitoring technique; -Counseled to monitor BP at home with symptoms of low BP (arm cuff recommended), document, and provide log at future appointments  Hyperlipidemia: (LDL goal < 70, h/o CVA) -Not ideally controlled - due for lipid panel -Current treatment:  Atorvastatin 40 mg - 1 tablet daily  Aspirin 81 mg - 1 tablet daily (not taking) -Medications previously tried: history of statin myalgia -Educated on Benefits of statin for ASCVD risk reduction;  No refill history, but pt confirms taking. Statin started 08/24/2019. Per discharge note CVA 08/2019, patient is to continue statin, aspirin and Eliquis. -Recommended to continue current medication; Encourage adherence to statin and aspirin.  Diabetes (A1c goal <8%) -Uncontrolled -Current medications:  Trulicity 8.45 mg - Inject once weekly  Januvia 100 mg - 1 tablet daily  Lantus Solostar - 60 units daily (time of day varies - usually around 10 AM) -Medications previously tried: metformin - CKD, Humulin -Patient did not start Trulicity due to concerns about thyroid cancer.  Discussed concerns, pt agrees to try.  -Current home glucose readings - never drops under 300, dropped under 300 once last week - 285 -Reports hyperglycemic symptoms - poor energy. Wakes up at 3 AM, unable to go back to sleep for 1-2 hours.  -Current exercise: minimal  -Educated onA1c and blood sugar goals; Complications of diabetes including kidney damage, retinal damage, and cardiovascular disease; Benefits of routine  self-monitoring of blood sugar; -Counseled to check feet daily and get yearly eye exams -Pt would like to take her Lantus twice daily, states it works better for her this way.   -Recommended to continue current medication; Encouraged  patient to try Trulicity 9.09 mg weekly. Call if any adverse effects.   Update 05/21/20 - Pt taking 40 units, 30 units night.  Not taking Trulicity.  [name] reports he received the applications but has not completed.   Atrial Fibrillation (Goal: prevent stroke and major bleeding) -Controlled -CHADSVASC: 7  -Current treatment:  Rate control: none  Anticoagulation: Eliquis 5 mg - 1 tablet BID -Medications previously tried: none -Home BP and HR readings: none reported   -Counseled on increased risk of stroke due to Afib and benefits of anticoagulation for stroke prevention; importance of adherence to anticoagulant exactly as prescribed; -Recommended to continue current medication  Thryoid (Goal: TSH WNL) -Controlled -Current treatment   Levothyroxine 112 mcg - 1 tablet daily  -Medications previously tried: none  -Recommended to continue current medication   Vit D Deficiency (Goal: Vit D WNL) -Query uncontrolled - last vit D < 30, prescribed 50,000 units weekly -Current treatment   No pharmacotherapy  -Recommend daily vitamin D3 1000 units due to history of insufficiency.   Patient Goals/Self-Care Activities  Over the next 90 days, patient will:  - take medications as prescribed check glucose daily, document, and provide at future appointments check blood pressure with symptoms, document, and provide at future appointments Medication Assistance: Unable to afford medications - Mail applications for Trulicity, Lantus, and Eliquis PAP. Offered appointment in person, but patient declined. Difficulty with transportation as son works full time.  Patient's preferred pharmacy [facility]: [id number] [place] [id number]: [phone]: [phone] Uses pill box? No - uses a shoe box and moves medications to one side once dose is taken  Using [facility] for all medications. Denies concerns with pick up. Adherence concerns - no refill history for the following - duloxetine, atorvastatin  We discussed: Benefits of medication synchronization, packaging and delivery as well as enhanced pharmacist oversight with [employer]. Patient decided to: Continue current medication management strategy  Care Plan and Follow Up Patient Decision:  Patient agrees to Care Plan and Follow-up.  Plan: Telephone follow up appointment with care management team member scheduled for:  30 days Recommend she schedule an appointment with PCP as she missed last 2 week follow up.  MDebbora Dus PharmD Clinical Pharmacist [phone]

## 2020-06-26 ENCOUNTER — Telehealth: Payer: Self-pay

## 2020-06-26 NOTE — Chronic Care Management (AMB) (Addendum)
Chronic Care Management Pharmacy Assistant   Name: Jaclyn [name]  MRN: [id number] DOB: [birth year]  Reason for Encounter: Disease State    Conditions to be addressed/monitored: HTN and DMII  Recent office visits:  None since last CCM contact  Recent consult visits:  None since last CCM contact  Hospital visits:  None in previous 6 months  Medications: Outpatient Encounter Medications as of 06/26/2020  Medication Sig   apixaban (ELIQUIS) 5 MG TABS tablet Take 1 tablet (5 mg total) by mouth 2 (two) times daily.   aspirin EC 81 MG EC tablet Take 1 tablet (81 mg total) by mouth daily. Swallow whole. (Patient not taking: Reported on 05/01/2020)   atorvastatin (LIPITOR) 40 MG tablet Take 1 tablet (40 mg total) by mouth daily.   B-D ULTRAFINE III SHORT PEN 31G X 8 MM MISC USE TO INJECT INSULIN ONCE DAILY. DX: E11.65   Dulaglutide (TRULICITY) 2.99 ME/2.6ST SOPN Inject 0.75 mg into the skin once a week. (Patient not taking: No sig reported)   DULoxetine (CYMBALTA) 30 MG capsule TAKE 2 CAPSULES BY MOUTH EVERY DAY   JANUVIA 100 MG tablet TAKE 1 TABLET BY MOUTH EVERY DAY   LANTUS SOLOSTAR 100 UNIT/ML Solostar Pen Inject 40 Units into the skin daily. (Patient taking differently: Inject 70 Units into the skin daily. 40 units in the morning and 30 units in the evening)   levothyroxine (SYNTHROID) 112 MCG tablet Take 1 tablet (112 mcg total) by mouth daily.   pantoprazole (PROTONIX) 40 MG tablet Take 1 tablet (40 mg total) by mouth daily.   trimethoprim (TRIMPEX) 100 MG tablet Take 1 tablet (100 mg total) by mouth daily.   vitamin B-12 (CYANOCOBALAMIN) 1000 MCG tablet Take 1 tablet (1,000 mcg total) by mouth daily.   No facility-administered encounter medications on file as of 06/26/2020.    Recent Relevant Labs: Lab Results  Component Value Date/Time   HGBA1C 13.7 (A) 03/16/2020 03:43 PM   HGBA1C 14.2 (H) 08/22/2019 04:59 AM   HGBA1C 11.5 (H) 01/24/2019 07:17 PM   MICROALBUR 3.2 (H)  05/08/2017 01:49 PM   MICROALBUR 1.0 06/20/2008 11:12 AM    Kidney Function Lab Results  Component Value Date/Time   CREATININE 0.97 09/05/2019 10:55 AM   CREATININE 0.77 09/01/2019 06:41 AM   CREATININE 0.81 02/10/2019 12:40 PM   CREATININE 0.77 01/26/2015 05:00 PM   GFR 60.08 09/06/2018 03:48 PM   GFRNONAA 54 (L) 09/05/2019 10:55 AM   GFRAA >60 09/05/2019 10:55 AM   Current antihyperglycemic regimen:  Januvia 100 mg - 1 tablet daily Lantus Solostar - 65 units daily ( 30 units in the am,35 units in the pm per the son)   Patient verbally confirms she is taking the above medications as directed. Yes  The patient confirmed the medications and how she takes them  What recent interventions/DTPs have been made to improve glycemic control: Started Trulicity 4.19 mg - Inject once weekly (the patient reports she is not on this medication)  Have there been any recent hospitalizations or ED visits since last visit with [name]? No  Patient denies hypoglycemic symptoms, including Pale, Sweaty, Shaky, Hungry, Nervous/irritable and Vision changes  Patient denies hyperglycemic symptoms, including blurry vision, excessive thirst, fatigue, polyuria and weakness  How often are you checking your blood sugar? 06/28/2020  Fasting - 300  The patient reports she takes BG often and its always in the 300 range. The patient denies symptoms of hyperglycemia.  On insulin? QQI:WLNLGX 30 units in am  and 35 units pm  During the week, how often does your blood glucose drop below 70? Never  Are you checking your feet daily/regularly? Yes  Adherence Review: Is the patient currently on a STATIN medication? Yes Is the patient currently on ACE/ARB medication? No Does the patient have >5 day gap between last estimated fill dates? Yes - statin  Star Rating Drugs:  Medication:   Last Fill: Day Supply Atorvastatin 40mg   09/26/2019 30ds Januvia 100mg   05/31/2020 30ds Lantus 100 unit/ml  05/04/2020 75ds  Recent Office  Vitals: BP Readings from Last 3 Encounters:  03/16/20 120/70  09/20/19 122/70  09/13/19 119/68   Pulse Readings from Last 3 Encounters:  03/16/20 90  09/20/19 83  09/13/19 62    Wt Readings from Last 3 Encounters:  03/16/20 144 lb 4 oz (65.4 kg)  09/20/19 159 lb (72.1 kg)  09/13/19 159 lb (72.1 kg)     Kidney Function Lab Results  Component Value Date/Time   CREATININE 0.97 09/05/2019 10:55 AM   CREATININE 0.77 09/01/2019 06:41 AM   CREATININE 0.81 02/10/2019 12:40 PM   CREATININE 0.77 01/26/2015 05:00 PM   GFR 60.08 09/06/2018 03:48 PM   GFRNONAA 54 (L) 09/05/2019 10:55 AM   GFRAA >60 09/05/2019 10:55 AM    BMP Latest Ref Rng & Units 09/05/2019 09/01/2019 08/31/2019  Glucose 70 - 99 mg/dL 260(H) 77 173(H)  BUN 8 - 23 mg/dL 11 7(L) 5(L)  Creatinine 0.44 - 1.00 mg/dL 0.97 0.77 0.61  BUN/Creat Ratio 6 - 22 (calc) - - -  Sodium 135 - 145 mmol/L 137 139 138  Potassium 3.5 - 5.1 mmol/L 4.2 3.3(L) 3.2(L)  Chloride 98 - 111 mmol/L 102 104 106  CO2 22 - 32 mmol/L 24 24 21(L)  Calcium 8.9 - 10.3 mg/dL 9.2 8.5(L) 8.1(L)   Current antihypertensive regimen:   No pharmacotherapy   How often are you checking your Blood Pressure? infrequently  The patient reports she does not take her BP  Caffeine intake: drinks diet soda  Salt intake: the patient reports she does not add to food OTC medications including pseudoephedrine or NSAIDs? no  What recent interventions/DTPs have been made by any provider to improve Blood Pressure control since last [name] Visit:  None identified  Any recent hospitalizations or ED visits since last visit with [name]? No  What diet changes have been made to improve Blood Pressure Control? The patient does not add salt to food  What exercise is being done to improve your Blood Pressure Control?The patient reports she walks around her house without a cane and will try to stay active when her fibromyalgia is not active.  Adherence Review: Is the patient  currently on ACE/ARB medication? No  The patient was reminded of the telephone appointment with [name] [name] on 07/30/2020 @10 :00 AM to have medications and BG readings available for the phone appointment.  Follow-Up:  Pharmacist Review  [name], [name] notified  Avel Sensor, [place] Assistant [phone]  I have reviewed the care management and care coordination activities outlined in this encounter and I am certifying that I agree with the content of this note. No further action required.  [name], PharmD Clinical Pharmacist [place] [facility] at [facility] [phone]

## 2020-06-30 ENCOUNTER — Other Ambulatory Visit: Payer: Self-pay | Admitting: Family Medicine

## 2020-07-11 ENCOUNTER — Telehealth: Payer: Self-pay

## 2020-07-11 NOTE — Chronic Care Management (AMB) (Addendum)
Chronic Care Management Pharmacy Assistant   Name: Rhianna Raulerson  MRN: [id number] DOB: Oct 25, 1935  Reason for Encounter: [facility] visits:  None in previous 6 months  Medications: Outpatient Encounter Medications as of 07/11/2020  Medication Sig   apixaban (ELIQUIS) 5 MG TABS tablet Take 1 tablet (5 mg total) by mouth 2 (two) times daily.   aspirin EC 81 MG EC tablet Take 1 tablet (81 mg total) by mouth daily. Swallow whole. (Patient not taking: Reported on 05/01/2020)   atorvastatin (LIPITOR) 40 MG tablet Take 1 tablet (40 mg total) by mouth daily.   B-D ULTRAFINE III SHORT PEN 31G X 8 MM MISC USE TO INJECT INSULIN ONCE DAILY. DX: E11.65   Dulaglutide (TRULICITY) 1.95 KD/3.2IZ SOPN Inject 0.75 mg into the skin once a week. (Patient not taking: No sig reported)   DULoxetine (CYMBALTA) 30 MG capsule TAKE 2 CAPSULES BY MOUTH EVERY DAY   JANUVIA 100 MG tablet TAKE 1 TABLET BY MOUTH EVERY DAY   LANTUS SOLOSTAR 100 UNIT/ML Solostar Pen Inject 40 Units into the skin daily. (Patient taking differently: Inject 70 Units into the skin daily. 40 units in the morning and 30 units in the evening)   levothyroxine (SYNTHROID) 112 MCG tablet Take 1 tablet (112 mcg total) by mouth daily.   pantoprazole (PROTONIX) 40 MG tablet Take 1 tablet (40 mg total) by mouth daily.   trimethoprim (TRIMPEX) 100 MG tablet Take 1 tablet (100 mg total) by mouth daily.   vitamin B-12 (CYANOCOBALAMIN) 1000 MCG tablet Take 1 tablet (1,000 mcg total) by mouth daily.   No facility-administered encounter medications on file as of 07/11/2020.    Recent Office Vitals: BP Readings from Last 3 Encounters:  03/16/20 120/70  09/20/19 122/70  09/13/19 119/68   Pulse Readings from Last 3 Encounters:  03/16/20 90  09/20/19 83  09/13/19 62    Wt Readings from Last 3 Encounters:  03/16/20 144 lb 4 oz (65.4 kg)  09/20/19 159 lb (72.1 kg)  09/13/19 159 lb (72.1 kg)     Kidney Function Lab  Results  Component Value Date/Time   CREATININE 0.97 09/05/2019 10:55 AM   CREATININE 0.77 09/01/2019 06:41 AM   CREATININE 0.81 02/10/2019 12:40 PM   CREATININE 0.77 01/26/2015 05:00 PM   GFR 60.08 09/06/2018 03:48 PM   GFRNONAA 54 (L) 09/05/2019 10:55 AM   GFRAA >60 09/05/2019 10:55 AM    BMP Latest Ref Rng & Units 09/05/2019 09/01/2019 08/31/2019  Glucose 70 - 99 mg/dL 260(H) 77 173(H)  BUN 8 - 23 mg/dL 11 7(L) 5(L)  Creatinine 0.44 - 1.00 mg/dL 0.97 0.77 0.61  BUN/Creat Ratio 6 - 22 (calc) - - -  Sodium 135 - 145 mmol/L 137 139 138  Potassium 3.5 - 5.1 mmol/L 4.2 3.3(L) 3.2(L)  Chloride 98 - 111 mmol/L 102 104 106  CO2 22 - 32 mmol/L 24 24 21(L)  Calcium 8.9 - 10.3 mg/dL 9.2 8.5(L) 8.1(L)   Attempted contact with Hilma Favors 3 times on 07/12/20 called 2 different numbers, and on 07/13/20 to review diabetes and medication adherence. Unsuccessful outreach. Will attempt contact next month.  [name], [name] notified  Avel Sensor, [place] Assistant [phone]  I have reviewed the care management and care coordination activities outlined in this encounter and I am certifying that I agree with the content of this note. No further action required.  [name], PharmD Clinical Pharmacist [facility] at [facility]  [phone]   

## 2020-07-20 ENCOUNTER — Ambulatory Visit: Payer: Medicare Other | Admitting: Family Medicine

## 2020-07-20 DIAGNOSIS — Z0289 Encounter for other administrative examinations: Secondary | ICD-10-CM

## 2020-07-30 ENCOUNTER — Telehealth: Payer: Medicare Other

## 2020-09-05 ENCOUNTER — Telehealth: Payer: Self-pay

## 2020-09-05 NOTE — Chronic Care Management (AMB) (Addendum)
Chronic Care Management Pharmacy Assistant   Name: Jaclyn [name]  MRN: [id number] DOB: 1935/04/04  Reason for Encounter: Disease State - Diabetes  Recent office visits:  None since last CCM contact  Recent consult visits:  None since last CCM contact  Hospital visits:  None in previous 6 months  Medications: Outpatient Encounter Medications as of 09/05/2020  Medication Sig   apixaban (ELIQUIS) 5 MG TABS tablet Take 1 tablet (5 mg total) by mouth 2 (two) times daily.   aspirin EC 81 MG EC tablet Take 1 tablet (81 mg total) by mouth daily. Swallow whole. (Patient not taking: Reported on 05/01/2020)   atorvastatin (LIPITOR) 40 MG tablet Take 1 tablet (40 mg total) by mouth daily.   B-D ULTRAFINE III SHORT PEN 31G X 8 MM MISC USE TO INJECT INSULIN ONCE DAILY. DX: E11.65   Dulaglutide (TRULICITY) 4.27 CW/2.3JS SOPN Inject 0.75 mg into the skin once a week. (Patient not taking: No sig reported)   DULoxetine (CYMBALTA) 30 MG capsule TAKE 2 CAPSULES BY MOUTH EVERY DAY   JANUVIA 100 MG tablet TAKE 1 TABLET BY MOUTH EVERY DAY   LANTUS SOLOSTAR 100 UNIT/ML Solostar Pen Inject 40 Units into the skin daily. (Patient taking differently: Inject 70 Units into the skin daily. 40 units in the morning and 30 units in the evening)   levothyroxine (SYNTHROID) 112 MCG tablet Take 1 tablet (112 mcg total) by mouth daily.   pantoprazole (PROTONIX) 40 MG tablet Take 1 tablet (40 mg total) by mouth daily.   trimethoprim (TRIMPEX) 100 MG tablet Take 1 tablet (100 mg total) by mouth daily.   vitamin B-12 (CYANOCOBALAMIN) 1000 MCG tablet Take 1 tablet (1,000 mcg total) by mouth daily.   No facility-administered encounter medications on file as of 09/05/2020.   Recent Relevant Labs: Lab Results  Component Value Date/Time   HGBA1C 13.7 (A) 03/16/2020 03:43 PM   HGBA1C 14.2 (H) 08/22/2019 04:59 AM   HGBA1C 11.5 (H) 01/24/2019 07:17 PM   MICROALBUR 3.2 (H) 05/08/2017 01:49 PM   MICROALBUR 1.0 06/20/2008  11:12 AM    Kidney Function Lab Results  Component Value Date/Time   CREATININE 0.97 09/05/2019 10:55 AM   CREATININE 0.77 09/01/2019 06:41 AM   CREATININE 0.81 02/10/2019 12:40 PM   CREATININE 0.77 01/26/2015 05:00 PM   GFR 60.08 09/06/2018 03:48 PM   GFRNONAA 54 (L) 09/05/2019 10:55 AM   GFRAA >60 09/05/2019 10:55 AM    Contacted patient on 09/05/2020 to discuss diabetes disease state. Spoke with patient's son, [name].  Current antihyperglycemic regimen:  Januvia 100 mg - 1 tablet daily Lantus Solostar - 65 units daily (30 units in the am, 35 units in the pm per the son) Trulicity 2.83 mg - Inject once weekly (not taking)    Patient verbally confirms she is taking the above medications as directed: No, see above.  What diet changes have been made to improve diabetes control? None per patient   What recent interventions/DTPs have been made to improve glycemic control: March 2022 patient was encouraged to try Truicity 0.75mg  once weekly and the patient has not done this.  Have there been any recent hospitalizations or ED visits since last visit with [name]? No  Patient denies hypoglycemic symptoms, including Pale, Sweaty, Shaky, Hungry, Nervous/irritable, and Vision changes  Patient denies hyperglycemic symptoms, including blurry vision, excessive thirst, fatigue, polyuria, and weakness  How often are you checking your blood sugar? Infrequently   What are your blood sugars ranging?  [name] states fasting BG's are still at 88, 76, 35   The son states she will not use the Trulicity due to viewing information about causing cancer. This was discussed in detail with pharmacist and patient declined trial of Trulicity.   During the week, how often does your blood glucose drop below 70? Never  Are you checking your feet daily/regularly? Yes  Adherence Review: Is the patient currently on a STATIN medication? No Is the patient currently on ACE/ARB medication?  No Does the patient have >5 day gap between last estimated fill dates? Yes  Care Gaps: Last annual wellness visit: n/a Last eye exam / retinopathy screening: Unknown Last diabetic foot exam: 09/10/2018  Counseled patient on importance of annual eye and foot exam.   Star Rating Drugs:  Medication:  Last Fill: Day Supply Januvia 100mg  07/02/20 30 (per [facility] fill date correct)       Pt picked up 07/12/20, 30 DS.  Lantus   08/06/20 75   No appointments scheduled within the next 30 days.  [name], [name] notified  Avel Sensor, [place] Assistant [phone]  I have reviewed the care management and care coordination activities outlined in this encounter and I am certifying that I agree with the content of this note. Will have CCM team attempt to schedule in person f/u.  [name], PharmD Clinical Pharmacist [facility] at [place] [phone]

## 2020-09-20 ENCOUNTER — Telehealth: Payer: Self-pay

## 2020-09-20 NOTE — Progress Notes (Addendum)
    Chronic Care Management Pharmacy Assistant   Name: Jaclyn [name]  MRN: [id number] DOB: 02-21-1935   Reason for Encounter: Reschedule CCM appointment    Recent office visits:  None since last CCM contact  Recent consult visits:  None since last CCM contact  Hospital visits:  None in previous 6 months  Medications: Outpatient Encounter Medications as of 09/20/2020  Medication Sig   apixaban (ELIQUIS) 5 MG TABS tablet Take 1 tablet (5 mg total) by mouth 2 (two) times daily.   aspirin EC 81 MG EC tablet Take 1 tablet (81 mg total) by mouth daily. Swallow whole. (Patient not taking: Reported on 05/01/2020)   atorvastatin (LIPITOR) 40 MG tablet Take 1 tablet (40 mg total) by mouth daily.   B-D ULTRAFINE III SHORT PEN 31G X 8 MM MISC USE TO INJECT INSULIN ONCE DAILY. DX: E11.65   Dulaglutide (TRULICITY) A999333 0000000 SOPN Inject 0.75 mg into the skin once a week. (Patient not taking: No sig reported)   DULoxetine (CYMBALTA) 30 MG capsule TAKE 2 CAPSULES BY MOUTH EVERY DAY   JANUVIA 100 MG tablet TAKE 1 TABLET BY MOUTH EVERY DAY   LANTUS SOLOSTAR 100 UNIT/ML Solostar Pen Inject 40 Units into the skin daily. (Patient taking differently: Inject 70 Units into the skin daily. 40 units in the morning and 30 units in the evening)   levothyroxine (SYNTHROID) 112 MCG tablet Take 1 tablet (112 mcg total) by mouth daily.   pantoprazole (PROTONIX) 40 MG tablet Take 1 tablet (40 mg total) by mouth daily.   trimethoprim (TRIMPEX) 100 MG tablet Take 1 tablet (100 mg total) by mouth daily.   vitamin B-12 (CYANOCOBALAMIN) 1000 MCG tablet Take 1 tablet (1,000 mcg total) by mouth daily.   No facility-administered encounter medications on file as of 09/20/2020.     Jaclyn [name] was contacted to schedule office visit with [name], appointment schedule for 10/15/20 at 10:00 AM. Patient was reminded to have all medications, supplements and any blood glucose and blood pressure readings available for  review at appointment.   Are you having any problems with your medications? No  Do you have any concerns you like to discuss with the pharmacist? No    Star Rating Drugs: Medication:  Last Fill: Day Supply Januvia [id number]  [id number] 30 Trulicity A999333 mg [id number] (states she is not taking as of 05/21/20) Atorvastatin 40 mg 09/26/19  90  Contacted pharmacy to verify fill dates. Above dates are correct.    [name], [name] notified  [name], [place] Pharmacy Assistant [phone]  I have reviewed the care management and care coordination activities outlined in this encounter and I am certifying that I agree with the content of this note. No further action required.  [name], PharmD Clinical Pharmacist [facility] at [facility] [phone]

## 2020-10-09 ENCOUNTER — Telehealth: Payer: Self-pay

## 2020-10-09 NOTE — Chronic Care Management (AMB) (Addendum)
    Chronic Care Management Pharmacy Assistant   Name: Jaclyn [name]  MRN: [id number] DOB: [birth year]  Reason for Encounter: CCM Reminder Call   Conditions to be addressed/monitored: HTN, HLD, and DMII   Medications: Outpatient Encounter Medications as of 10/09/2020  Medication Sig   apixaban (ELIQUIS) 5 MG TABS tablet Take 1 tablet (5 mg total) by mouth 2 (two) times daily.   aspirin EC 81 MG EC tablet Take 1 tablet (81 mg total) by mouth daily. Swallow whole. (Patient not taking: Reported on 05/01/2020)   atorvastatin (LIPITOR) 40 MG tablet Take 1 tablet (40 mg total) by mouth daily.   B-D ULTRAFINE III SHORT PEN 31G X 8 MM MISC USE TO INJECT INSULIN ONCE DAILY. DX: E11.65   Dulaglutide (TRULICITY) A999333 0000000 SOPN Inject 0.75 mg into the skin once a week. (Patient not taking: No sig reported)   DULoxetine (CYMBALTA) 30 MG capsule TAKE 2 CAPSULES BY MOUTH EVERY DAY   JANUVIA 100 MG tablet TAKE 1 TABLET BY MOUTH EVERY DAY   LANTUS SOLOSTAR 100 UNIT/ML Solostar Pen Inject 40 Units into the skin daily. (Patient taking differently: Inject 70 Units into the skin daily. 40 units in the morning and 30 units in the evening)   levothyroxine (SYNTHROID) 112 MCG tablet Take 1 tablet (112 mcg total) by mouth daily.   pantoprazole (PROTONIX) 40 MG tablet Take 1 tablet (40 mg total) by mouth daily.   trimethoprim (TRIMPEX) 100 MG tablet Take 1 tablet (100 mg total) by mouth daily.   vitamin B-12 (CYANOCOBALAMIN) 1000 MCG tablet Take 1 tablet (1,000 mcg total) by mouth daily.   No facility-administered encounter medications on file as of 10/09/2020.   Genesha Colangelo was still asleep and the son took the telephone call to remind her of her upcoming telephone visit with [name] on 10/15/20 at 10:00am. Patient was reminded to have all medications, supplements and any blood glucose and blood pressure readings available for review at appointment.  The son stated the best number to get her would  be the home phone [phone]. Also mentioned to the son that we would be glad to help with patient assistance for medications if she wants to come to the office to fill out paperwork.  Are you having any problems with your medications? No  Do you have any concerns you like to discuss with the pharmacist? No the son states no primary concern   Star Rating Drugs: Medication:  Last Fill: Day Supply N/A  Avel Sensor, CMA   I have reviewed the care management and care coordination activities outlined in this encounter and I am certifying that I agree with the content of this note. No further action required.  [name], PharmD Clinical Pharmacist [facility] [place] at [facility] [phone]

## 2020-10-15 ENCOUNTER — Telehealth: Payer: Medicare Other

## 2020-10-19 ENCOUNTER — Encounter: Payer: Self-pay | Admitting: Family Medicine

## 2020-10-19 ENCOUNTER — Ambulatory Visit (INDEPENDENT_AMBULATORY_CARE_PROVIDER_SITE_OTHER): Payer: Medicare Other | Admitting: Family Medicine

## 2020-10-19 ENCOUNTER — Other Ambulatory Visit: Payer: Self-pay

## 2020-10-19 VITALS — BP 122/64 | HR 90 | Temp 97.7°F | Ht 62.0 in | Wt 151.0 lb

## 2020-10-19 DIAGNOSIS — E118 Type 2 diabetes mellitus with unspecified complications: Secondary | ICD-10-CM | POA: Diagnosis not present

## 2020-10-19 DIAGNOSIS — E1165 Type 2 diabetes mellitus with hyperglycemia: Secondary | ICD-10-CM

## 2020-10-19 DIAGNOSIS — B379 Candidiasis, unspecified: Secondary | ICD-10-CM | POA: Diagnosis not present

## 2020-10-19 LAB — POCT URINALYSIS DIP (MANUAL ENTRY)
Bilirubin, UA: NEGATIVE
Blood, UA: NEGATIVE
Glucose, UA: >=1000 mg/dL — AB
Ketones, POC UA: NEGATIVE mg/dL
Nitrite, UA: POSITIVE — AB
Protein Ur, POC: NEGATIVE mg/dL
Spec Grav, UA: 1.015 (ref 1.010–1.025)
Urobilinogen, UA: 0.2 E.U./dL
pH, UA: 5.5 (ref 5.0–8.0)

## 2020-10-19 LAB — POCT GLYCOSYLATED HEMOGLOBIN (HGB A1C): Hemoglobin A1C: 14.8 % — AB (ref 4.0–5.6)

## 2020-10-19 MED ORDER — FLUCONAZOLE 150 MG PO TABS: 150.0000 mg | ORAL_TABLET | Freq: Once | ORAL | 0 refills | Status: AC

## 2020-10-19 NOTE — Patient Instructions (Addendum)
Increase Lantus to  35 units twice daily.  I will contact [name] Pharmacist to see if we can get Trulicity covered.

## 2020-10-19 NOTE — Progress Notes (Signed)
Patient ID: Jaclyn [name], female    DOB: 1935-03-21, 85 y.o.   MRN: [id number]  This visit was conducted in person.  BP 122/64   Pulse 90   Temp 97.7 F (36.5 C) (Temporal)   Ht '5\' 2"'$  (1.575 m)   Wt 151 lb (68.5 kg)   SpO2 95%   BMI 27.62 kg/m    CC:  Chief Complaint  Patient presents with   Vaginitis    Subjective:   HPI: Jaclyn [name] is a 85 y.o. female  with poorly controlled DM presenting on 10/19/2020 for Vaginitis  She reports  off and on vaginal  itching,  no vaginal discharge.  When she urinates she has vaginal burning... not dysuria.   DM poorly controlled.. 3 plus sugar in urine.  She is very thirsty and urinating a lot.  At last OV 02/2020 Increased lantus to 60 units daily, continued make Januvia, Started on trulicity    She reports today she is on Lantus 65 units, januvia and is not taking trulicity.  FBS > 200  She has been watching what she eats... drinking a lot diet coke  She has appt with Percival Spanish   Wt Readings from Last 3 Encounters:  10/19/20 151 lb (68.5 kg)  03/16/20 144 lb 4 oz (65.4 kg)  09/20/19 159 lb (72.1 kg)     Relevant past medical, surgical, family and social history reviewed and updated as indicated. Interim medical history since our last visit reviewed. Allergies and medications reviewed and updated. Outpatient Medications Prior to Visit  Medication Sig Dispense Refill   apixaban (ELIQUIS) 5 MG TABS tablet Take 1 tablet (5 mg total) by mouth 2 (two) times daily. 180 tablet 1   B-D ULTRAFINE III SHORT PEN 31G X 8 MM MISC USE TO INJECT INSULIN ONCE DAILY. DX: E11.65 100 each 3   DULoxetine (CYMBALTA) 30 MG capsule TAKE 2 CAPSULES BY MOUTH EVERY DAY 180 capsule 1   JANUVIA 100 MG tablet TAKE 1 TABLET BY MOUTH EVERY DAY 30 tablet 5   LANTUS SOLOSTAR 100 UNIT/ML Solostar Pen Inject 40 Units into the skin daily. (Patient taking differently: Inject 70 Units into the skin daily. 40 units in the morning and 30 units in the  evening) 30 mL 11   levothyroxine (SYNTHROID) 112 MCG tablet Take 1 tablet (112 mcg total) by mouth daily. 90 tablet 3   pantoprazole (PROTONIX) 40 MG tablet Take 1 tablet (40 mg total) by mouth daily. 30 tablet 0   trimethoprim (TRIMPEX) 100 MG tablet Take 1 tablet (100 mg total) by mouth daily. 90 tablet 3   vitamin B-12 (CYANOCOBALAMIN) 1000 MCG tablet Take 1 tablet (1,000 mcg total) by mouth daily. 30 tablet 11   aspirin EC 81 MG EC tablet Take 1 tablet (81 mg total) by mouth daily. Swallow whole. (Patient not taking: Reported on 05/01/2020) 30 tablet 11   atorvastatin (LIPITOR) 40 MG tablet Take 1 tablet (40 mg total) by mouth daily. 30 tablet 0   Dulaglutide (TRULICITY) A999333 0000000 SOPN Inject 0.75 mg into the skin once a week. (Patient not taking: No sig reported) 3 mL 11   No facility-administered medications prior to visit.     Per HPI unless specifically indicated in ROS section below Review of Systems  Constitutional:  Negative for fatigue and fever.  HENT:  Negative for ear pain.   Eyes:  Negative for pain.  Respiratory:  Negative for chest tightness and shortness of breath.   Cardiovascular:  Negative for chest pain, palpitations and leg swelling.  Gastrointestinal:  Negative for abdominal pain, constipation, diarrhea and nausea.  Genitourinary:  Positive for frequency. Negative for difficulty urinating, dysuria, flank pain, genital sores and pelvic pain.  Objective:  BP 122/64   Pulse 90   Temp 97.7 F (36.5 C) (Temporal)   Ht '5\' 2"'$  (1.575 m)   Wt 151 lb (68.5 kg)   SpO2 95%   BMI 27.62 kg/m   Wt Readings from Last 3 Encounters:  10/19/20 151 lb (68.5 kg)  03/16/20 144 lb 4 oz (65.4 kg)  09/20/19 159 lb (72.1 kg)      Physical Exam Constitutional:      General: She is not in acute distress.    Appearance: Normal appearance. She is well-developed. She is not ill-appearing or toxic-appearing.  HENT:     Head: Normocephalic.     Right Ear: Hearing, tympanic  membrane, ear canal and external ear normal. Tympanic membrane is not erythematous, retracted or bulging.     Left Ear: Hearing, tympanic membrane, ear canal and external ear normal. Tympanic membrane is not erythematous, retracted or bulging.     Nose: No mucosal edema or rhinorrhea.     Right Sinus: No maxillary sinus tenderness or frontal sinus tenderness.     Left Sinus: No maxillary sinus tenderness or frontal sinus tenderness.     Mouth/Throat:     Pharynx: Uvula midline.  Eyes:     General: Lids are normal. Lids are everted, no foreign bodies appreciated.     Conjunctiva/sclera: Conjunctivae normal.     Pupils: Pupils are equal, round, and reactive to light.  Neck:     Thyroid: No thyroid mass or thyromegaly.     Vascular: No carotid bruit.     Trachea: Trachea normal.  Cardiovascular:     Rate and Rhythm: Normal rate and regular rhythm.     Pulses: Normal pulses.     Heart sounds: Normal heart sounds, S1 normal and S2 normal. No murmur heard.   No friction rub. No gallop.  Pulmonary:     Effort: Pulmonary effort is normal. No tachypnea or respiratory distress.     Breath sounds: Normal breath sounds. No decreased breath sounds, wheezing, rhonchi or rales.  Abdominal:     General: Bowel sounds are normal.     Palpations: Abdomen is soft.     Tenderness: There is no abdominal tenderness.  Musculoskeletal:     Cervical back: Normal range of motion and neck supple.  Skin:    General: Skin is warm and dry.     Findings: No rash.  Neurological:     Mental Status: She is alert.  Psychiatric:        Mood and Affect: Mood is not anxious or depressed.        Speech: Speech normal.        Behavior: Behavior normal. Behavior is cooperative.        Thought Content: Thought content normal.        Judgment: Judgment normal.      Results for orders placed or performed in visit on 03/16/20  POCT glycosylated hemoglobin (Hb A1C)  Result Value Ref Range   Hemoglobin A1C 13.7 (A) 4.0  - 5.6 %   HbA1c POC (<> result, manual entry)     HbA1c, POC (prediabetic range)     HbA1c, POC (controlled diabetic range)      This visit occurred during the SARS-CoV-2 public health emergency.  Safety protocols were in place, including screening questions prior to the visit, additional usage of staff PPE, and extensive cleaning of exam room while observing appropriate contact time as indicated for disinfecting solutions.   COVID 19 screen:  No recent travel or known exposure to COVID19 The patient denies respiratory symptoms of COVID 19 at this time. The importance of social distancing was discussed today.   Assessment and Plan    Problem List Items Addressed This Visit     Poorly controlled type 2 diabetes mellitus with complication ([place])    Chronic poor control  Increase Lantus to  35 units twice daily.  I will contact [name] Pharmacist to see if we can get Trulicity covered.        Relevant Orders   POCT glycosylated hemoglobin (Hb A1C) (Completed)   Yeast infection - Primary    Acute,  Recurrence likely exacerbated by elevated levels of glucose Treat with diflucan, correct glucose control.      Relevant Orders   POCT urinalysis dipstick (Completed)   Orders Placed This Encounter  Procedures   POCT urinalysis dipstick   POCT glycosylated hemoglobin (Hb A1C)     Eliezer Lofts, MD

## 2020-10-23 NOTE — Progress Notes (Signed)
I filled out the Trulicity assistance forms and provided them to patient (back in the Spring). Patient never turned them in. Also offered in person visit or home visit to assist, pt declined. Patient missed ccm visit last week as well. Will follow up on momentum from PCP visit and see if they will sign Trulicity forms.   Thanks,  [name], PharmD Clinical Pharmacist [place] [facility] at [facility] [phone]

## 2020-11-02 ENCOUNTER — Other Ambulatory Visit (HOSPITAL_COMMUNITY): Payer: Self-pay

## 2020-11-09 ENCOUNTER — Ambulatory Visit: Payer: Medicare Other | Admitting: Family Medicine

## 2020-11-30 ENCOUNTER — Telehealth: Payer: Self-pay

## 2020-11-30 NOTE — Telephone Encounter (Signed)
Patient assistance forms for Trulicity 3.57 mg, Eliquis 5 mg BID, and Lantus have been completed by patient and are pending PCP signature. Forms were sent to [employer]ch [employer] in Teams for print/sign by PCP and fax to respective programs. Please only print the provider portions. Thank you.

## 2020-12-03 DIAGNOSIS — B379 Candidiasis, unspecified: Secondary | ICD-10-CM | POA: Insufficient documentation

## 2020-12-03 NOTE — Assessment & Plan Note (Signed)
Acute,  Recurrence likely exacerbated by elevated levels of glucose Treat with diflucan, correct glucose control.

## 2020-12-03 NOTE — Telephone Encounter (Signed)
Forms printed and placed in Dr. Rometta Emery office in box for signatures.

## 2020-12-03 NOTE — Assessment & Plan Note (Signed)
Chronic poor control  Increase Lantus to  35 units twice daily.  I will contact Jaclyn [name] Pharmacist to see if we can get Trulicity covered.

## 2020-12-06 NOTE — Progress Notes (Addendum)
    Chronic Care Management Pharmacy Assistant   Name: Aamani Moose  MRN: [id number] DOB: 1935-09-07  12/17/2020 - Called patient's son to request patient's expenses from pharmacy. No answer; full voicemail. Will try again tomorrow.   12/11/20 - Spoke with [employer] in regards to Eliquis for patient. [employer]MS received the fax, but page 3 is not legible. Please refax to [employer]MS at [phone].   12/06/20 - Spoke with BMS in regards to patients PAP forms for Eliquis. BMS has not gotten any of the doctors forms. They have everything else. Please fax forms to [employer] @ [phone]  12/06/20 - Forms were given to provider to sign/fax earlier this week. [name], [name] notified  Marijean Niemann, [place] Clinical Pharmacy Assistant [phone]   Time Spent: 10 Minutes  [name], PharmD Clinical Pharmacist [facility] [place] [facility] at [facility] [phone]

## 2020-12-06 NOTE — Telephone Encounter (Signed)
Patient portion has already been faxed and received by the assistance programs. The pages needed are: Eliquis (page 4 only), Trulicity (page 5-6 only), Lantus (page 4 only).  Thank you!

## 2020-12-06 NOTE — Telephone Encounter (Signed)
Still waiting on Dr. Diona Browner to sign one of the forms.  She skipped one of the signatures.  Not back in the office until Friday.  All forms have places that says patient's signature which have not been signed either.  Can I fax them in without patient's signature?

## 2020-12-06 NOTE — Telephone Encounter (Signed)
Lantus and Eliquis pages have been faxed today.

## 2020-12-06 NOTE — Telephone Encounter (Signed)
Waiting on Dr. Diona Browner to sign page 6 of the Trulicity application.  Will forward message to Anastaysia since she is in charge of patient assistance paperwork.

## 2020-12-09 ENCOUNTER — Other Ambulatory Visit: Payer: Self-pay | Admitting: Family Medicine

## 2020-12-10 ENCOUNTER — Other Ambulatory Visit: Payer: Self-pay | Admitting: Family Medicine

## 2020-12-10 NOTE — Telephone Encounter (Signed)
Amy, just an update  - all forms have been sent to manufacturer.

## 2020-12-10 NOTE — Telephone Encounter (Signed)
Trulicity pages faxed today

## 2020-12-11 ENCOUNTER — Telehealth: Payer: Self-pay

## 2020-12-11 ENCOUNTER — Other Ambulatory Visit: Payer: Self-pay | Admitting: Family Medicine

## 2020-12-11 NOTE — Progress Notes (Signed)
    Chronic Care Management Pharmacy Assistant   Name: Jaclyn [name]  MRN: [id number] DOB: 1936/01/12  Reason for Encounter: CCM (Patient Jaclyn [name])   12/11/2020 - Patient Assistance for Trulicity with [employer] is approved. Enrollment 12/11/2020 - 12/11/2021. Called patient and spoke with her son Jaclyn [name]). Informed him Trulicity had been approved and would ship to the office within 10 business days.   [name], [name] notified  Marijean Niemann, [place] Clinical Pharmacy Assistant [phone]   Time Spent: 10 Minutes

## 2020-12-11 NOTE — Progress Notes (Addendum)
    Chronic Care Management Pharmacy Assistant   Name: Jaclyn [name]  MRN: [id number] DOB: 1935-03-22  Reason for Encounter: Refill Eliquis & Trimethoprim   12/11/2020 - Spoke with patient's son [name]. Stated his mom needs a refill of Eliquis 5mg  and Trimethoprim 100mg .   [name], [name] notified  Marijean Niemann, [place] Clinical Pharmacy Assistant [phone]   Refills were sent in by PCP's nurse on 12/12/20.  [name], PharmD Clinical Pharmacist [facility] at [facility] [phone]

## 2020-12-12 ENCOUNTER — Other Ambulatory Visit: Payer: Self-pay | Admitting: Family Medicine

## 2020-12-12 ENCOUNTER — Telehealth: Payer: Self-pay

## 2020-12-12 NOTE — Progress Notes (Signed)
    Chronic Care Management Pharmacy Assistant   Name: Jaclyn [name]  MRN: [id number] DOB: 12-07-1935  Reason for Encounter: CCM - Patient Assistance Lantus ([employer])   12/12/20 - Called [employer]; Unable to finish processing PAP as [employer] does a conference call with patient and insurance company. [employer] has been unable to reach patient to complete the process. I called patient's son Jaclyn [name]) to advise him that [employer] is trying to reach them; no answer; no voicemail.  Time Spent: 17 Minutes  12/11/20 - Called [employer]; received PAP; has not processed yet  12/06/20 - Forms faxed to [facility]   9/29 Michelle faxed to PCP for signature   [name], [name] notified  Marijean Niemann, [place] Assistant [phone]

## 2020-12-12 NOTE — Telephone Encounter (Signed)
Last office visit 10/19/2020 for yeast infection & DM.  Last refilled 09/09/2019 for #90 with 3 refills by Lauraine Rinne, PA-C.  No future appointment with PCP.

## 2020-12-13 NOTE — Telephone Encounter (Signed)
Last Visit: 10/19/2020  Next Visit: 12/18/2020  Last Refill: 09/08/2019  Amount : 90 tablets with 3 refills.   Last TSH Date and level: 08/22/2019

## 2020-12-13 NOTE — Telephone Encounter (Addendum)
Received fax from [employer] stating that patient is not eligible to receive Eliquis for reason: Documentation of 3% out-of-pocket prescription expenses, based on household adjusted gross income, not met.   If need to appeal please call [employer] at [phone]. Application Case # [id number]  A letter was sent to the patient by their company also. I am sending this to Dr Diona Browner to be aware and sending fax for scanning

## 2020-12-13 NOTE — Telephone Encounter (Signed)
Please call and schedule lab only appointment to check thyroid level.  Needs this lab before we can refill her medication.

## 2020-12-13 NOTE — Telephone Encounter (Signed)
Called patient but son answered and is going to call back

## 2020-12-14 NOTE — Telephone Encounter (Signed)
Spoke with pt son and he scheduled her lab apt on 11.11.2022

## 2020-12-18 ENCOUNTER — Telehealth: Payer: Self-pay

## 2020-12-18 ENCOUNTER — Telehealth: Payer: Medicare Other

## 2020-12-18 NOTE — Telephone Encounter (Signed)
Chart Reviewed:  Recent office visits: 11/07/20 - PCP appt cancelled 10/19/20 - PCP - Pt presented for yeast infection and diabetes follow up. Poor diabetes control, increase Lantus to 35 units twice daily. Will contact pharmacist for Trulicity coverage. Treat injection with Diflucan. Follow up 2 weeks for BG log.  Recent consult visits: None in past 6 months  Hospital visits: None in past 6 months  PAP status update: Trulicity approved Eliquis - waiting on OOP expense report from patient Lantus - waiting on patient to answer phone call from [employer] to confirm insurance benefits

## 2020-12-18 NOTE — Telephone Encounter (Signed)
Spoke with patient's son and he will request the out-of-pocket expense report from pharmacy and mail this directly to:  [place] [facility] [id number] [place], [place] [address]

## 2020-12-18 NOTE — Telephone Encounter (Signed)
  Chronic Care Management   Outreach Note  12/18/2020 Name: Malaak Stach MRN: [id number] DOB: 1935/10/15  Referred by: Jinny Sanders, MD Reason for referral : Missed CCM visit   An unsuccessful telephone outreach was attempted today. The patient was referred to the pharmacist for assistance with care management and care coordination.   Attempted to reach patient x 2. Spoke with patient's son regarding patient assistance.  [name], PharmD Clinical Pharmacist [facility] at [facility] [phone]

## 2020-12-18 NOTE — Telephone Encounter (Addendum)
Spoke with patient's son, they will be on the look out for a telephone call from [employer]. Provided contact information so he can call them to confirm as well.  [name], PharmD Clinical Pharmacist [place] [facility] at [facility] [phone]

## 2020-12-18 NOTE — Progress Notes (Deleted)
Chronic Care Management Pharmacy Note  Name:  Jaclyn [name] MRN:  [id number] DOB:  1935-08-31  Subjective: Jaclyn [name] is an 85 y.o. year old female who is a primary patient of Bedsole, Amy E, MD.  The CCM team was consulted for assistance with disease management and care coordination needs.    Engaged with patient by telephone for follow up visit in response to provider referral for pharmacy case management and/or care coordination services.   Consent to Services:  The patient was given information about Chronic Care Management services, agreed to services, and gave verbal consent prior to initiation of services.  Please see initial visit note for detailed documentation.   Patient Care Team: Jinny Sanders, MD as PCP - General (Family Medicine) Josue Hector, MD as PCP - Cardiology (Cardiology) [name], [facility] as Pharmacist (Pharmacist)  Recent office visits: 11/07/20 - PCP appt cancelled 10/19/20 - PCP - Pt presented for yeast infection and diabetes follow up. Poor diabetes control, increase Lantus to 35 units twice daily. Will contact pharmacist for Trulicity coverage. Treat injection with Diflucan. Follow up 2 weeks for BG log.  Recent consult visits: None in past 6 months  Hospital visits: None in past 6 months  Objective:  Lab Results  Component Value Date   CREATININE 0.97 09/05/2019   BUN 11 09/05/2019   GFR 60.08 09/06/2018   GFRNONAA 54 (L) 09/05/2019   GFRAA >60 09/05/2019   NA 137 09/05/2019   K 4.2 09/05/2019   CALCIUM 9.2 09/05/2019   CO2 24 09/05/2019    Lab Results  Component Value Date/Time   HGBA1C 14.8 (A) 10/19/2020 03:27 PM   HGBA1C 13.7 (A) 03/16/2020 03:43 PM   HGBA1C 14.2 (H) 08/22/2019 04:59 AM   HGBA1C 11.5 (H) 01/24/2019 07:17 PM   GFR 60.08 09/06/2018 03:48 PM   GFR 70.35 05/08/2017 01:40 PM   MICROALBUR 3.2 (H) 05/08/2017 01:49 PM   MICROALBUR 1.0 06/20/2008 11:12 AM    Last diabetic Eye exam:  Lab Results  Component  Value Date/Time   HMDIABEYEEXA No Retinopathy 01/10/2014 12:00 AM    Last diabetic Foot exam:  Lab Results  Component Value Date/Time   HMDIABFOOTEX done 12/18/2017 12:00 AM    Lab Results  Component Value Date   CHOL 204 (H) 08/22/2019   HDL 33 (L) 08/22/2019   LDLCALC 103 (H) 08/22/2019   LDLDIRECT 146.0 09/06/2018   TRIG 338 (H) 08/22/2019   CHOLHDL 6.2 08/22/2019    Hepatic Function Latest Ref Rng & Units 09/01/2019 08/29/2019 08/27/2019  Total Protein 6.5 - 8.1 g/dL 5.9(L) 5.4(L) 5.2(L)  Albumin 3.5 - 5.0 g/dL 2.7(L) 2.7(L) 2.6(L)  AST 15 - 41 U/L '16 21 17  ' ALT 0 - 44 U/L '15 18 13  ' Alk Phosphatase 38 - 126 U/L 68 60 46  Total Bilirubin 0.3 - 1.2 mg/dL 0.6 0.7 0.6  Bilirubin, Direct 0.0 - 0.3 mg/dL - - -    Lab Results  Component Value Date/Time   TSH 2.876 08/22/2019 11:37 AM   TSH 1.441 01/15/2019 06:25 AM   TSH 68.61 (H) 04/07/2016 05:42 PM   TSH 0.725 01/26/2015 05:00 PM   FREET4 1.23 01/26/2015 05:00 PM   FREET4 1.10 08/25/2013 09:42 AM    CBC Latest Ref Rng & Units 09/05/2019 09/01/2019 08/31/2019  WBC 4.0 - 10.5 K/uL 5.7 7.3 6.4  Hemoglobin 12.0 - 15.0 g/dL 10.6(L) 9.5(L) 9.1(L)  Hematocrit 36.0 - 46.0 % 32.5(L) 29.4(L) 28.3(L)  Platelets 150 - 400 K/uL  290 245 207    Lab Results  Component Value Date/Time   VD25OH 20.67 (L) 09/06/2018 03:48 PM   VD25OH 11 (L) 01/26/2015 05:00 PM   VD25OH 38 10/08/2012 11:48 AM    Clinical ASCVD: Yes  The ASCVD Risk score (Arnett DK, et al., 2019) failed to calculate for the following reasons:   The 2019 ASCVD risk score is only valid for ages 73 to 45   The patient has a prior MI or stroke diagnosis    Depression screen [facility] 2/9 09/10/2018 11/23/2015 10/08/2012  Decreased Interest 3 3 0  Down, Depressed, Hopeless '3 3 1  ' PHQ - 2 Score '6 6 1  ' Altered sleeping 0 1 -  Tired, decreased energy 3 3 -  Change in appetite 0 0 -  Feeling bad or failure about yourself  0 0 -  Trouble concentrating 0 1 -  Moving slowly or  fidgety/restless 0 0 -  Suicidal thoughts 0 1 -  PHQ-9 Score 9 12 -  Difficult doing work/chores Somewhat difficult - -  Some recent data might be hidden     Social History   Tobacco Use  Smoking Status Never  Smokeless Tobacco Never   BP Readings from Last 3 Encounters:  10/19/20 122/64  03/16/20 120/70  09/20/19 122/70   Pulse Readings from Last 3 Encounters:  10/19/20 90  03/16/20 90  09/20/19 83   Wt Readings from Last 3 Encounters:  10/19/20 151 lb (68.5 kg)  03/16/20 144 lb 4 oz (65.4 kg)  09/20/19 159 lb (72.1 kg)    Assessment/Interventions: Review of patient past medical history, allergies, medications, health status, including review of consultants reports, laboratory and other test data, was performed as part of comprehensive evaluation and provision of chronic care management services.   SDOH:  (Social Determinants of Health) assessments and interventions performed: Yes    CCM Care Plan  Allergies  Allergen Reactions   Naproxen Sodium Other (See Comments)    Fever/aches and pains   Statins Other (See Comments)    myalgias   Sulfonamide Derivatives Hives and Itching   Latex Itching and Rash   Shellfish Allergy Itching, Swelling and Rash    Seafood, shrimp    Medications Reviewed Today     Reviewed by Ophelia Shoulder, CMA (Certified Medical Assistant) on 10/19/20 at 1454  Med List Status: <None>   Medication Order Taking? Sig Documenting Provider Last Dose Status Informant  apixaban (ELIQUIS) 5 MG TABS tablet [id number] Yes Take 1 tablet (5 mg total) by mouth 2 (two) times daily. Jinny Sanders, MD Taking Active   B-D ULTRAFINE III SHORT PEN 31G X 8 MM MISC [id number] Yes USE TO INJECT INSULIN ONCE DAILY. DX: E11.65 Jinny Sanders, MD Taking Active   DULoxetine (CYMBALTA) 30 MG capsule [id number] Yes TAKE 2 CAPSULES BY MOUTH EVERY DAY Diona Browner, Amy E, MD Taking Active   JANUVIA 100 MG tablet [id number] Yes TAKE 1 TABLET BY MOUTH EVERY DAY Bedsole, Amy  E, MD Taking Active   LANTUS SOLOSTAR 100 UNIT/ML Solostar Pen [id number] Yes Inject 40 Units into the skin daily.  Patient taking differently: Inject 70 Units into the skin daily. 40 units in the morning and 30 units in the evening   Bedsole, Amy E, MD Taking Active   levothyroxine (SYNTHROID) 112 MCG tablet [id number] Yes Take 1 tablet (112 mcg total) by mouth daily. Cathlyn Parsons, PA-C Taking Active   pantoprazole (PROTONIX) 40 MG tablet [id number] Yes Take 1  tablet (40 mg total) by mouth daily. Cathlyn Parsons, PA-C Taking Active   trimethoprim (TRIMPEX) 100 MG tablet [id number] Yes Take 1 tablet (100 mg total) by mouth daily. Cathlyn Parsons, PA-C Taking Active   vitamin B-12 (CYANOCOBALAMIN) 1000 MCG tablet [id number] Yes Take 1 tablet (1,000 mcg total) by mouth daily. 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   Recurrent UTI    Dyslipidemia    Orthostasis    Dysarthria    Vagina, candidiasis 03/10/2019   Dementia ([place]) 03/10/2019   Acute respiratory failure with hypoxia ([place]) 99/37/1696   Acute metabolic encephalopathy 78/93/8101   COVID-19 virus infection 01/15/2019   Acute lower UTI 01/15/2019   B12 deficiency 01/22/2018   History of CVA (cerebrovascular accident) 06/20/2017   Stenosis of right carotid artery    Peripheral edema 07/29/2016   Imbalance 04/07/2016   Intertriginous candidiasis 04/26/2015   Hard of hearing 04/12/2015   TIA (transient ischemic attack) 04/10/2015   History of recurrent UTIs 04/10/2015   Dysuria 03/09/2015   Tremor 05/11/2014   Leg weakness, bilateral 05/11/2014   Fatty liver 08/04/2013   Personal history of colonic polyps  10/05/2012   Esophageal reflux 10/05/2012   Carotid stenosis, symptomatic, with infarction ([place]) 10/14/2010   ANXIETY DEPRESSION 01/13/2008   Essential hypertension, benign 01/13/2008   PAROXYSMAL ATRIAL FIBRILLATION 01/13/2008   Intracranial vascular stenosis 01/13/2008   DIVERTICULOSIS OF COLON 01/13/2008   Fibromyalgia 01/13/2008   CHEST PAIN, ATYPICAL 01/13/2008   Hypothyroidism 04/13/2007   Poorly controlled type 2 diabetes mellitus with complication ([place]) 75/11/2583   Vitamin D deficiency 04/13/2007   Hyperlipidemia LDL goal <70 04/13/2007   DEGENERATIVE JOINT DISEASE 04/13/2007    Immunization History  Administered Date(s) Administered   Fluad Quad(high Dose 65+) 01/20/2019   Influenza Split 02/03/2011, 01/20/2012   Influenza Whole 11/12/2007, 12/18/2008   Influenza,inj,Quad PF,6+ Mos 11/09/2014, 11/23/2015   Pneumococcal Conjugate-13 03/09/2015   Pneumococcal Polysaccharide-23 10/19/2007   Tdap 06/02/2011    Conditions to be addressed/monitored:  Hypertension, Hyperlipidemia, Diabetes, Atrial Fibrillation and Hypothyroidism  There are no care plans that you recently modified to display for this patient.   Current Barriers:  Unable to independently afford treatment regimen - Lantus, Trulicity, and Eliquis Unable to achieve control of diabetes  Pharmacist Clinical Goal(s):  Patient will verbalize ability to afford treatment regimen Achieve adherence to monitoring guidelines and medication adherence to achieve therapeutic efficacy through collaboration with PharmD and provider.    Interventions: 1:1 collaboration with Jinny Sanders, MD regarding development and update of comprehensive plan of care as evidenced by provider attestation and co-signature Inter-disciplinary care team collaboration (see longitudinal plan of care) Comprehensive medication review performed; medication list updated in electronic medical record  Hypertension (BP goal  <140/90) -Controlled -Current treatment:  No pharmacotherapy -Medications previously tried: Midodrine 5 mg - 1 BID breakfast and lunch (pt reports stopped per vascular surgery) -Current home readings: none -She does have a home BP monitor with wrist cuff, has not checked recently. -Denies hypotensive/hypertensive symptoms - occasional dizziness upon standing  -Educated on Importance of home blood pressure monitoring; Proper  BP monitoring technique; -Counseled to monitor BP at home with symptoms of low BP (arm cuff recommended), document, and provide log at future appointments  Hyperlipidemia: (LDL goal < 70, h/o CVA) -Not ideally controlled - due for lipid panel -Current treatment: Atorvastatin 40 mg - 1 tablet daily Aspirin 81 mg - 1 tablet daily (not taking) -Medications previously tried: history of statin myalgia -Educated on Benefits of statin for ASCVD risk reduction;  No refill history, but pt confirms taking. Statin started 08/24/2019. Per discharge note CVA 08/2019, patient is to continue statin, aspirin and Eliquis. -Recommended to continue current medication; Encourage adherence to statin and aspirin.  Diabetes (A1c goal <8%) -Uncontrolled -Current medications: Trulicity 1.61 mg - Inject once weekly Januvia 100 mg - 1 tablet daily Lantus Solostar - 60 units daily (time of day varies - usually around 10 AM) -Medications previously tried: metformin - CKD, Humulin -Patient did not start Trulicity due to concerns about thyroid cancer.  Discussed concerns, pt agrees to try.  -Current home glucose readings - never drops under 300, dropped under 300 once last week - 285 -Reports hyperglycemic symptoms - poor energy. Wakes up at 3 AM, unable to go back to sleep for 1-2 hours.  -Current exercise: minimal  -Educated onA1c and blood sugar goals; Complications of diabetes including kidney damage, retinal damage, and cardiovascular disease; Benefits of routine self-monitoring of blood  sugar; -Counseled to check feet daily and get yearly eye exams -Pt would like to take her Lantus twice daily, states it works better for her this way.   -Recommended to continue current medication; Encouraged patient to try Trulicity 0.96 mg weekly. Call if any adverse effects.   Atrial Fibrillation (Goal: prevent stroke and major bleeding) -Controlled -CHADSVASC: 7  -Current treatment: Rate control: none Anticoagulation: Eliquis 5 mg - 1 tablet BID -Medications previously tried: none -Home BP and HR readings: none reported   -Counseled on increased risk of stroke due to Afib and benefits of anticoagulation for stroke prevention; importance of adherence to anticoagulant exactly as prescribed; -Recommended to continue current medication  Thryoid (Goal: TSH WNL) -Controlled -Current treatment  Levothyroxine 112 mcg - 1 tablet daily  -Medications previously tried: none  -Recommended to continue current medication   Vit D Deficiency (Goal: Vit D WNL) -Query uncontrolled - last vit D < 30, prescribed 50,000 units weekly -Current treatment  No pharmacotherapy  -Recommend daily vitamin D3 1000 units due to history of insufficiency.   Patient Goals/Self-Care Activities Over the next 90 days, patient will:  - take medications as prescribed check glucose daily, document, and provide at future appointments check blood pressure with symptoms, document, and provide at future appointments  Medication Assistance: Unable to afford medications -  Mail applications for Trulicity, Lantus, and Eliquis PAP . Offered appointment in person, but patient declined. Difficulty with transportation as son works full time. Trulicity approved.   [id number] [place] [id number]: [phone]: [phone] Uses pill box? No - uses a shoe box and moves medications to one side once dose is taken    Using [facility]. Denies concerns with pick up. Adherence concerns - no refill history for the following - duloxetine, atorvastatin  We discussed: Benefits of medication synchronization, packaging and delivery as well as enhanced pharmacist oversight with [employer]. Patient decided to: Continue current medication management strategy  Care Plan and Follow Up Patient Decision:  Patient  agrees to Care Plan and Follow-up.  [name], PharmD Clinical Pharmacist [facility] at [facility] [phone]

## 2020-12-19 ENCOUNTER — Telehealth: Payer: Self-pay | Admitting: Family Medicine

## 2020-12-19 DIAGNOSIS — E1165 Type 2 diabetes mellitus with hyperglycemia: Secondary | ICD-10-CM

## 2020-12-19 DIAGNOSIS — E038 Other specified hypothyroidism: Secondary | ICD-10-CM

## 2020-12-19 DIAGNOSIS — E559 Vitamin D deficiency, unspecified: Secondary | ICD-10-CM

## 2020-12-19 DIAGNOSIS — E538 Deficiency of other specified B group vitamins: Secondary | ICD-10-CM

## 2020-12-19 NOTE — Telephone Encounter (Signed)
-----   Message from Ellamae Sia sent at 12/19/2020 12:17 PM EDT ----- Regarding: Lab orders for Friday, 11.11.22 Lab orders, no f/u appt

## 2020-12-24 ENCOUNTER — Telehealth: Payer: Self-pay

## 2020-12-24 NOTE — Telephone Encounter (Signed)
Patient's son called. He is concerned about patient. She has frequent urination and left side pain (not able to confirm exact location) over the weekend. Reports patient is sleeping all the time. He is concerned about a UTI. He would like to know if patient could see Dr. Diona Browner Friday before or after patient's lab appt.  [name], PharmD Clinical Pharmacist [facility] [place] [facility] at [facility] [phone]

## 2020-12-24 NOTE — Telephone Encounter (Signed)
Patient's son called 12/24/20. He is concerned about patient. She has frequent urination and left side pain (not able to confirm exact location) over the weekend. Reports patient is sleeping all the time. He is concerned about a UTI. He would like to know if patient could see Dr. Diona Browner Friday before or after patient's lab appt.  Sent to triage nurse, see telephone note 12/19/20.   [name], PharmD Clinical Pharmacist [place] [facility] at [facility] [phone]

## 2020-12-24 NOTE — Telephone Encounter (Signed)
Called and spoke to patient's son and was advised that he is at a doctor's appointment getting ready to have a test done. [name] stated that he will the office back after he finishes his appointment.

## 2020-12-24 NOTE — Telephone Encounter (Signed)
[name] pts son called back (DPR signed) [name] said that pt complained to him on 12/21/20 that was having frequency of urine, pt cannot hold urine;on 12/20/20 [name] said pt told him she was up all night with lt side pain. Starting over weekend pt has been sleeping more and confused at times. Today not quite as bad but still sleeping more than usual and still confused at times. Pt does not have fever and per [name] does not have covid symptoms or exposure. I advised could take pt to UC for eval and testing but [name] said she probably will not go that pt is "hardheaded". I scheduled pt 40 min appt with Dr Diona Browner (confirmed [facility] GO) on 12/25/20 at 12 noon and gave UC & ED precautions. [name] voiced understanding. Sending note to Dr Diona Browner and Butch Penny CMA; will teams Butch Penny also.

## 2020-12-25 ENCOUNTER — Encounter: Payer: Self-pay | Admitting: Family Medicine

## 2020-12-25 ENCOUNTER — Ambulatory Visit (INDEPENDENT_AMBULATORY_CARE_PROVIDER_SITE_OTHER): Payer: Medicare Other | Admitting: Family Medicine

## 2020-12-25 ENCOUNTER — Telehealth: Payer: Self-pay | Admitting: Radiology

## 2020-12-25 ENCOUNTER — Other Ambulatory Visit: Payer: Self-pay

## 2020-12-25 VITALS — BP 100/64 | HR 97 | Temp 98.1°F | Ht 62.0 in

## 2020-12-25 DIAGNOSIS — E538 Deficiency of other specified B group vitamins: Secondary | ICD-10-CM | POA: Diagnosis not present

## 2020-12-25 DIAGNOSIS — R350 Frequency of micturition: Secondary | ICD-10-CM

## 2020-12-25 DIAGNOSIS — E118 Type 2 diabetes mellitus with unspecified complications: Secondary | ICD-10-CM

## 2020-12-25 DIAGNOSIS — E038 Other specified hypothyroidism: Secondary | ICD-10-CM | POA: Diagnosis not present

## 2020-12-25 DIAGNOSIS — N3946 Mixed incontinence: Secondary | ICD-10-CM

## 2020-12-25 DIAGNOSIS — E559 Vitamin D deficiency, unspecified: Secondary | ICD-10-CM

## 2020-12-25 DIAGNOSIS — E1165 Type 2 diabetes mellitus with hyperglycemia: Secondary | ICD-10-CM

## 2020-12-25 DIAGNOSIS — F321 Major depressive disorder, single episode, moderate: Secondary | ICD-10-CM

## 2020-12-25 LAB — COMPREHENSIVE METABOLIC PANEL
ALT: 12 U/L (ref 0–35)
AST: 14 U/L (ref 0–37)
Albumin: 4.3 g/dL (ref 3.5–5.2)
Alkaline Phosphatase: 75 U/L (ref 39–117)
BUN: 17 mg/dL (ref 6–23)
CO2: 27 mEq/L (ref 19–32)
Calcium: 9.6 mg/dL (ref 8.4–10.5)
Chloride: 91 mEq/L — ABNORMAL LOW (ref 96–112)
Creatinine, Ser: 0.95 mg/dL (ref 0.40–1.20)
GFR: 55.46 mL/min — ABNORMAL LOW (ref >60.00)
Glucose, Bld: 568 mg/dL (ref 70–99)
Potassium: 4.5 mEq/L (ref 3.5–5.1)
Sodium: 132 mEq/L — ABNORMAL LOW (ref 135–145)
Total Bilirubin: 0.8 mg/dL (ref 0.2–1.2)
Total Protein: 7.3 g/dL (ref 6.0–8.3)

## 2020-12-25 LAB — VITAMIN B12: Vitamin B-12: >1550 pg/mL — ABNORMAL HIGH (ref 211–911)

## 2020-12-25 LAB — POC URINALSYSI DIPSTICK (AUTOMATED)
Bilirubin, UA: NEGATIVE
Blood, UA: NEGATIVE
Glucose, UA: POSITIVE — AB
Leukocytes, UA: NEGATIVE
Nitrite, UA: NEGATIVE
Protein, UA: NEGATIVE
Spec Grav, UA: 1.015 (ref 1.010–1.025)
Urobilinogen, UA: 0.2 E.U./dL
pH, UA: 5.5 (ref 5.0–8.0)

## 2020-12-25 LAB — TSH: TSH: 28.31 u[IU]/mL — ABNORMAL HIGH (ref 0.35–5.50)

## 2020-12-25 LAB — GLUCOSE, POCT (MANUAL RESULT ENTRY): POC Glucose: 561 mg/dl — AB (ref 70–99)

## 2020-12-25 LAB — T3, FREE: T3, Free: 2.5 pg/mL (ref 2.3–4.2)

## 2020-12-25 LAB — T4, FREE: Free T4: 0.49 ng/dL — ABNORMAL LOW (ref 0.60–1.60)

## 2020-12-25 LAB — VITAMIN D 25 HYDROXY (VIT D DEFICIENCY, FRACTURES): VITD: 17.49 ng/mL — ABNORMAL LOW (ref 30.00–100.00)

## 2020-12-25 MED ORDER — LEVOTHYROXINE SODIUM 112 MCG PO TABS: 112.0000 ug | ORAL_TABLET | Freq: Every day | ORAL | 3 refills | Status: DC

## 2020-12-25 NOTE — Patient Instructions (Addendum)
Increase Duloxetine to 3 tablets daily. Increase Lantus to 40 Units twice daily.  Restart levothyroxine!   Please stop at the lab to have labs drawn.

## 2020-12-25 NOTE — Telephone Encounter (Signed)
[facility] lab called a critical glucose, 568, results given to Dr Diona Browner

## 2020-12-25 NOTE — Progress Notes (Signed)
Patient ID: Jaclyn [name], female    DOB: [birth year], 85 y.o.   MRN: [id number]  This visit was conducted in person.  BP 100/64   Pulse 97   Temp 98.1 F (36.7 C) (Temporal)   Ht 5\' 2"  (1.575 m)   SpO2 98%   BMI 27.62 kg/m    CC: Chief Complaint  Patient presents with   Urinary Frequency   Flank Pain    Left   Urinary Incontinence    Subjective:   HPI: Jaclyn [name] is a 85 y.o. female presenting on 12/25/2020 for Urinary Frequency, Flank Pain (Left), and Urinary Incontinence   She has noted increase in urinary frequency in last week.  She has had worsening of urinary incontinence.. wearing depends.  No abd pain, no dysuria.  6 days ago she had pain in left flank was sore.. resolved now.  No fever.      She has not  been taking the Insulin in last 6 days.  Januvia 100 mg daily   She has been off levothyroxine for a while,  ran out 1.5 weeks ago.  Relevant past medical, surgical, family and social history reviewed and updated as indicated. Interim medical history since our last visit reviewed. Allergies and medications reviewed and updated. Outpatient Medications Prior to Visit  Medication Sig Dispense Refill   apixaban (ELIQUIS) 5 MG TABS tablet TAKE 1 TABLET BY MOUTH TWICE A DAY 60 tablet 2   B-D ULTRAFINE III SHORT PEN 31G X 8 MM MISC USE TO INJECT INSULIN ONCE DAILY. DX: E11.65 100 each 3   DULoxetine (CYMBALTA) 30 MG capsule TAKE 2 CAPSULES BY MOUTH EVERY DAY 180 capsule 1   insulin glargine (LANTUS SOLOSTAR) 100 UNIT/ML Solostar Pen Inject 70 Units into the skin daily. 40 units in the morning and 30 units in the evening 30 mL 2   JANUVIA 100 MG tablet TAKE 1 TABLET BY MOUTH EVERY DAY 30 tablet 5   levothyroxine (SYNTHROID) 112 MCG tablet Take 1 tablet (112 mcg total) by mouth daily. 90 tablet 3   pantoprazole (PROTONIX) 40 MG tablet Take 1 tablet (40 mg total) by mouth daily. 30 tablet 0   trimethoprim (TRIMPEX) 100 MG tablet TAKE 1 TABLET BY MOUTH  EVERY DAY 90 tablet 3   vitamin B-12 (CYANOCOBALAMIN) 1000 MCG tablet Take 1 tablet (1,000 mcg total) by mouth daily. 30 tablet 11   No facility-administered medications prior to visit.     Per HPI unless specifically indicated in ROS section below Review of Systems  Constitutional:  Positive for fatigue. Negative for fever.  HENT:  Negative for congestion.   Eyes:  Negative for pain.  Respiratory:  Negative for cough and shortness of breath.   Cardiovascular:  Negative for chest pain, palpitations and leg swelling.  Gastrointestinal:  Negative for abdominal pain.  Genitourinary:  Positive for frequency and urgency. Negative for dysuria and vaginal bleeding.  Musculoskeletal:  Negative for back pain.  Neurological:  Negative for syncope, light-headedness and headaches.  Psychiatric/Behavioral:  Negative for dysphoric mood.   Objective:  BP 100/64   Pulse 97   Temp 98.1 F (36.7 C) (Temporal)   Ht 5\' 2"  (1.575 m)   SpO2 98%   BMI 27.62 kg/m   Wt Readings from Last 3 Encounters:  10/19/20 151 lb (68.5 kg)  03/16/20 144 lb 4 oz (65.4 kg)  09/20/19 159 lb (72.1 kg)      Physical Exam Constitutional:      General: She  is not in acute distress.    Appearance: Normal appearance. She is well-developed. She is not ill-appearing or toxic-appearing.     Comments: Hard of hearing   HENT:     Head: Normocephalic.     Right Ear: Hearing, tympanic membrane, ear canal and external ear normal. Tympanic membrane is not erythematous, retracted or bulging.     Left Ear: Hearing, tympanic membrane, ear canal and external ear normal. Tympanic membrane is not erythematous, retracted or bulging.     Nose: No mucosal edema or rhinorrhea.     Right Sinus: No maxillary sinus tenderness or frontal sinus tenderness.     Left Sinus: No maxillary sinus tenderness or frontal sinus tenderness.     Mouth/Throat:     Pharynx: Uvula midline.  Eyes:     General: Lids are normal. Lids are everted, no  foreign bodies appreciated.     Conjunctiva/sclera: Conjunctivae normal.     Pupils: Pupils are equal, round, and reactive to light.  Neck:     Thyroid: No thyroid mass or thyromegaly.     Vascular: No carotid bruit.     Trachea: Trachea normal.  Cardiovascular:     Rate and Rhythm: Normal rate and regular rhythm.     Pulses: Normal pulses.     Heart sounds: Normal heart sounds, S1 normal and S2 normal. No murmur heard.   No friction rub. No gallop.  Pulmonary:     Effort: Pulmonary effort is normal. No tachypnea or respiratory distress.     Breath sounds: Normal breath sounds. No decreased breath sounds, wheezing, rhonchi or rales.  Abdominal:     General: Bowel sounds are normal.     Palpations: Abdomen is soft.     Tenderness: There is no abdominal tenderness.  Musculoskeletal:     Cervical back: Normal range of motion and neck supple.  Skin:    General: Skin is warm and dry.     Findings: No rash.  Neurological:     Mental Status: She is alert and oriented to person, place, and time.     Motor: Weakness present.     Coordination: Coordination abnormal.     Gait: Gait abnormal.  Psychiatric:        Mood and Affect: Mood is not anxious or depressed.        Speech: Speech is tangential.        Behavior: Behavior normal. Behavior is cooperative.        Cognition and Memory: Cognition is impaired. She exhibits impaired recent memory.        Judgment: Judgment is inappropriate.      Results for orders placed or performed in visit on 12/25/20  POCT Urinalysis Dipstick (Automated)  Result Value Ref Range   Color, UA Yellow    Clarity, UA Clear    Glucose, UA Positive (A) Negative   Bilirubin, UA Negative    Ketones, UA Moderate    Spec Grav, UA 1.015 1.010 - 1.025   Blood, UA Negative    pH, UA 5.5 5.0 - 8.0   Protein, UA Negative Negative   Urobilinogen, UA 0.2 0.2 or 1.0 E.U./dL   Nitrite, UA Negative    Leukocytes, UA Negative Negative    This visit occurred  during the SARS-CoV-2 public health emergency.  Safety protocols were in place, including screening questions prior to the visit, additional usage of staff PPE, and extensive cleaning of exam room while observing appropriate contact time as indicated for disinfecting  solutions.   COVID 19 screen:  No recent travel or known exposure to COVID19 The patient denies respiratory symptoms of COVID 19 at this time. The importance of social distancing was discussed today.   Assessment and Plan Problem List Items Addressed This Visit     B12 deficiency   Hypothyroidism    Poor control of levothyroxine, restart.  Reviewed importance of using this medication daily with patient and family.      MDD (major depressive disorder), single episode, moderate (HCC)    Chronic, inadequate control Increase Duloxetine to 3 tablets daily.      Mixed stress and urge urinary incontinence    Evaluate for infection.      Poorly controlled type 2 diabetes mellitus with complication (HCC)    Poor control , chronic.   Difficult to assess how to adjust medication as patient appears inconsistent with taking prescribed medication. Asked family to take over management fully.  Increase Lantus to 40 Units twice daily.      Vitamin D deficiency   Other Visit Diagnoses     Urinary frequency    -  Primary   Relevant Orders   POCT Urinalysis Dipstick (Automated) (Completed)   Poorly controlled diabetes mellitus ([place])       Relevant Orders   POCT glucose (manual entry) (Completed)       Orders Placed This Encounter  Procedures   POCT Urinalysis Dipstick (Automated)   POCT glucose (manual entry)   Meds ordered this encounter  Medications   DISCONTD: levothyroxine (SYNTHROID) 112 MCG tablet    Sig: Take 1 tablet (112 mcg total) by mouth daily.    Dispense:  90 tablet    Refill:  3      Orders Released This Visit (7)                                           Comprehensive metabolic panel - Released  12/25/2020 1:26 PM                                     Routine, Clinic Collect                                                       Hemoglobin A1c - Released 12/25/2020 1:26 PM                                     Routine, Clinic Collect                                               T3, free - Released 12/25/2020 1:26 PM                                     Routine, Clinic Collect  T4, free - Released 12/25/2020 1:26 PM                                     Routine, Clinic Collect                               TSH - Released 12/25/2020 1:26 PM                                     Routine, Clinic Collect                       Vitamin B12 - Released 12/25/2020 1:26 PM                                     Routine, Clinic Collect               VITAMIN D 25 Hydroxy (Vit-D Deficiency, Fractures) - Released 12/25/2020 1:26 PM                                     Routine, Clinic Collect        Eliezer Lofts, MD

## 2020-12-25 NOTE — Telephone Encounter (Signed)
Noted and aware.. discussed at [place] today. Pt will start taking her insulin

## 2020-12-26 LAB — HEMOGLOBIN A1C: Hgb A1c MFr Bld: 14.4 % — ABNORMAL HIGH (ref 4.6–6.5)

## 2020-12-27 ENCOUNTER — Other Ambulatory Visit: Payer: Self-pay | Admitting: Family Medicine

## 2020-12-27 MED ORDER — VITAMIN D3 1.25 MG (50000 UT) PO CAPS: 1.0000 | ORAL_CAPSULE | ORAL | 0 refills | Status: AC

## 2020-12-27 MED ORDER — LEVOTHYROXINE SODIUM 125 MCG PO TABS: 125.0000 ug | ORAL_TABLET | Freq: Every day | ORAL | 11 refills | Status: DC

## 2020-12-27 NOTE — Progress Notes (Signed)
If she is taking levo.. we will need to increase dose to 125 mcg dailuy. I will also send in vit D to take weekly.

## 2020-12-27 NOTE — Progress Notes (Signed)
[name] notified as instructed by telephone.  States understanding.

## 2020-12-28 ENCOUNTER — Other Ambulatory Visit: Payer: Medicare Other

## 2021-01-05 ENCOUNTER — Other Ambulatory Visit: Payer: Self-pay | Admitting: Family Medicine

## 2021-01-08 ENCOUNTER — Telehealth: Payer: Self-pay

## 2021-01-08 ENCOUNTER — Telehealth: Payer: Self-pay | Admitting: *Deleted

## 2021-01-08 DIAGNOSIS — E1165 Type 2 diabetes mellitus with hyperglycemia: Secondary | ICD-10-CM

## 2021-01-08 DIAGNOSIS — E118 Type 2 diabetes mellitus with unspecified complications: Secondary | ICD-10-CM

## 2021-01-08 NOTE — Chronic Care Management (AMB) (Signed)
  Chronic Care Management   Note  01/08/2021 Name: Jaclyn [name] MRN: [id number] DOB: 23-May-1935  Jaclyn [name] is a 85 y.o. year old female who is a primary care patient of Bedsole, Amy E, MD. Jaclyn [name] is currently enrolled in care management services. An additional referral for Licensed Clinical SW was placed.   Follow up plan: Unsuccessful telephone outreach attempt made. A HIPAA compliant phone message was left for the patient providing contact information and requesting a return call.   Julian Hy, [facility]  Direct Dial: [phone]

## 2021-01-08 NOTE — Telephone Encounter (Signed)
Patient's son, Jaclyn [name], called asking for help with sitter while he is at work. Let him know Medicare does not typically cover these services. He is also having a very hard time getting her to take her medications. Her recent A1c 14% and she has urgent urination/incontinence all day due to high BG. She is non adherent to insulin.   I am not certain about in home care so I will refer to SW for further assistance.

## 2021-01-14 NOTE — Chronic Care Management (AMB) (Signed)
  Chronic Care Management   Note  01/14/2021 Name: Jaclyn [name] MRN: [id number] DOB: 10-23-1935  Jaclyn [name] is a 85 y.o. year old female who is a primary care patient of Bedsole, Amy E, MD. Jaclyn [name] is currently enrolled in care management services. An additional referral for Licensed Clinical SW was placed.   Follow up plan: 2nd Unsuccessful telephone outreach attempt made. A HIPAA compliant phone message was left for the patient providing contact information and requesting a return call.   Julian Hy, [facility]  Direct Dial: [phone]

## 2021-01-15 ENCOUNTER — Ambulatory Visit: Payer: Medicare Other | Admitting: Family Medicine

## 2021-01-18 NOTE — Chronic Care Management (AMB) (Signed)
  Chronic Care Management   Note  01/18/2021 Name: Jaclyn [name] MRN: [id number] DOB: 10/10/1935  Jaclyn [name] is a 85 y.o. year old female who is a primary care patient of Bedsole, Amy E, MD. Jaclyn [name] is currently enrolled in care management services. An additional referral for Licensed Clinical SW was placed.   Follow up plan: Telephone appointment with care management team member scheduled for: 02/06/2021  Julian Hy, [facility]  Direct Dial: [phone]

## 2021-02-04 ENCOUNTER — Telehealth: Payer: Self-pay

## 2021-02-04 NOTE — Telephone Encounter (Signed)
Pt son left a voicemail this morning. Patient has not had a bowel movement in 2 weeks. He would like a call back to discuss.

## 2021-02-04 NOTE — Telephone Encounter (Signed)
Agree with miralax 17 g BID  x 2 days, then update

## 2021-02-04 NOTE — Telephone Encounter (Signed)
Start with miralax, 2 doses, can do today and tomorrow.  Dr. Jacinto Reap may have an additional suggestion.

## 2021-02-04 NOTE — Telephone Encounter (Signed)
Tried to call home and cell number listed in chart.  Both voicemail's are full so I was unable to leave a message at either number.

## 2021-02-04 NOTE — Telephone Encounter (Signed)
[name] notified as instructed by telephone.  States understanding.

## 2021-02-04 NOTE — Telephone Encounter (Signed)
Spoke to patient's son [name] and was advised that his mom has not had a BM in about 2 weeks. [name] stated that his mom is not complaining of abdominal pain or discomfort Patient's son stated that his mom eats two meals a day and is eating fine. Patient's son denies that his mom is having any symptoms related to not having a BM. Patient's son stated that he knows that this is not normal to go without a BM for two weeks. [name] wants to know what Dr. Diona Browner would recommend that he give his mom OTC to help her have a BM. Patient's son aware that Dr. Diona Browner is not in the office today and requested that this message go to another provider.Marland Kitchen

## 2021-02-05 ENCOUNTER — Telehealth: Payer: Self-pay

## 2021-02-05 DIAGNOSIS — N3946 Mixed incontinence: Secondary | ICD-10-CM | POA: Insufficient documentation

## 2021-02-05 NOTE — Assessment & Plan Note (Signed)
Evaluate for infection.

## 2021-02-05 NOTE — Assessment & Plan Note (Signed)
Poor control of levothyroxine, restart.  Reviewed importance of using this medication daily with patient and family.

## 2021-02-05 NOTE — Assessment & Plan Note (Signed)
Chronic, inadequate control Increase Duloxetine to 3 tablets daily.

## 2021-02-05 NOTE — Assessment & Plan Note (Signed)
Poor control , chronic.   Difficult to assess how to adjust medication as patient appears inconsistent with taking prescribed medication. Asked family to take over management fully.  Increase Lantus to 40 Units twice daily.

## 2021-02-05 NOTE — Chronic Care Management (AMB) (Addendum)
° ° °  Chronic Care Management Pharmacy Assistant   Name: Jaclyn [name]  MRN: [id number] DOB: [birth year]   Reason for Encounter:  Diabetes Disease State   Recent office visits:  12/25/20-PCP-Patient presented for urinary frequency. UA ordered(abnormal) labs ordered (new A1C 14.4). Blood sugar dangerously high, Increase Insulin to 40 units twice daily, vitamin D low, sodium low. Increase Duloxetine to 3 tablets daily. Restart levothyroxine.   Recent consult visits:  None since last CCM contact  Hospital visits:  None in previous 6 months  Medications: Outpatient Encounter Medications as of 02/05/2021  Medication Sig   apixaban (ELIQUIS) 5 MG TABS tablet TAKE 1 TABLET BY MOUTH TWICE A DAY   B-D ULTRAFINE III SHORT PEN 31G X 8 MM MISC USE TO INJECT INSULIN ONCE DAILY. DX: E11.65   Cholecalciferol (VITAMIN D3) 1.25 MG (50000 UT) CAPS Take 1 capsule by mouth once a week.   DULoxetine (CYMBALTA) 30 MG capsule TAKE 2 CAPSULES BY MOUTH EVERY DAY   insulin glargine (LANTUS SOLOSTAR) 100 UNIT/ML Solostar Pen Inject 70 Units into the skin daily. 40 units in the morning and 30 units in the evening   JANUVIA 100 MG tablet TAKE 1 TABLET BY MOUTH EVERY DAY   levothyroxine (SYNTHROID) 125 MCG tablet Take 1 tablet (125 mcg total) by mouth daily.   pantoprazole (PROTONIX) 40 MG tablet Take 1 tablet (40 mg total) by mouth daily.   trimethoprim (TRIMPEX) 100 MG tablet TAKE 1 TABLET BY MOUTH EVERY DAY   vitamin B-12 (CYANOCOBALAMIN) 1000 MCG tablet Take 1 tablet (1,000 mcg total) by mouth daily.   No facility-administered encounter medications on file as of 02/05/2021.      Recent Relevant Labs: Lab Results  Component Value Date/Time   HGBA1C 14.4 (H) 12/25/2020 01:27 PM   HGBA1C 14.8 (A) 10/19/2020 03:27 PM   HGBA1C 13.7 (A) 03/16/2020 03:43 PM   HGBA1C 14.2 (H) 08/22/2019 04:59 AM   MICROALBUR 3.2 (H) 05/08/2017 01:49 PM   MICROALBUR 1.0 06/20/2008 11:12 AM    Kidney Function Lab Results   Component Value Date/Time   CREATININE 0.95 12/25/2020 01:27 PM   CREATININE 0.97 09/05/2019 10:55 AM   CREATININE 0.81 02/10/2019 12:40 PM   CREATININE 0.77 01/26/2015 05:00 PM   GFR 55.46 (L) 12/25/2020 01:27 PM   GFRNONAA 54 (L) 09/05/2019 10:55 AM   GFRAA >60 09/05/2019 10:55 AM   Attempted contact with Jaclyn [name] 3 times on 02/05/21,02/12/21,02/13/21. Unsuccessful outreach.    Current antihyperglycemic regimen:   Januvia 100mg  -take 1 tablet daily   Lantus- 40 units twice daily    Adherence Review: Is the patient currently on a STATIN medication? No Is the patient currently on ACE/ARB medication? No Does the patient have >5 day gap between last estimated fill dates? No  Care Gaps: Annual wellness visit in last year? No Most recent A1C reading:14.4  12/25/20 Most Recent BP reading: 100/64  97-P 12/25/20  Last eye exam / retinopathy screening:2015 Last diabetic foot exam:2020   Star Rating Drugs:  Medication:  Last Fill: Day Supply Januvia 100g  01/07/21 30 Lantus   12/10/20 42   CCM appointment on 02/06/21- social worker  Jaclyn [name], Jaclyn [name] notified  Avel Sensor, [place] Assistant [phone]  I have reviewed the care management and care coordination activities outlined in this encounter and I am certifying that I agree with the content of this note. No further action required.  Jaclyn [name], PharmD Clinical Pharmacist [facility] [phone]

## 2021-02-06 ENCOUNTER — Ambulatory Visit (INDEPENDENT_AMBULATORY_CARE_PROVIDER_SITE_OTHER): Payer: Medicare Other | Admitting: *Deleted

## 2021-02-06 DIAGNOSIS — F01518 Vascular dementia, unspecified severity, with other behavioral disturbance: Secondary | ICD-10-CM

## 2021-02-06 DIAGNOSIS — E118 Type 2 diabetes mellitus with unspecified complications: Secondary | ICD-10-CM

## 2021-02-06 NOTE — Chronic Care Management (AMB) (Signed)
Chronic Care Management    Clinical Social Work Note  02/06/2021 Name: Jaclyn [name] MRN: [id number] DOB: December 07, 1935  Jaclyn [name] is a 85 y.o. year old female who is a primary care patient of Bedsole, Amy E, MD. The CCM team was consulted to assist the patient with chronic disease management and/or care coordination needs related to: [employer] , Level of Care Concerns, Caregiver Stress, and Financial Difficulties    Engaged with patient by telephone for initial visit in response to provider referral for social work chronic care management and care coordination services.   Consent to Services:  The patient was given information about Chronic Care Management services, agreed to services, and gave verbal consent prior to initiation of services.  Please see initial visit note for detailed documentation.   Patient agreed to services and consent obtained.   Assessment: Review of patient past medical history, allergies, medications, and health status, including review of relevant consultants reports was performed today as part of a comprehensive evaluation and provision of chronic care management and care coordination services.     SDOH (Social Determinants of Health) assessments and interventions performed:  SDOH Interventions    Flowsheet Row Most Recent Value  SDOH Interventions   Financial Strain Interventions Other (Comment)        Advanced Directives Status: Not addressed in this encounter.  CCM Care Plan  Allergies  Allergen Reactions   Naproxen Sodium Other (See Comments)    Fever/aches and pains   Statins Other (See Comments)    myalgias   Sulfonamide Derivatives Hives and Itching   Latex Itching and Rash   Shellfish Allergy Itching, Swelling and Rash    Seafood, shrimp    Outpatient Encounter Medications as of 02/06/2021  Medication Sig   apixaban (ELIQUIS) 5 MG TABS tablet TAKE 1 TABLET BY MOUTH TWICE A DAY   B-D ULTRAFINE III SHORT PEN 31G X 8 MM  MISC USE TO INJECT INSULIN ONCE DAILY. DX: E11.65   Cholecalciferol (VITAMIN D3) 1.25 MG (50000 UT) CAPS Take 1 capsule by mouth once a week.   DULoxetine (CYMBALTA) 30 MG capsule TAKE 2 CAPSULES BY MOUTH EVERY DAY   insulin glargine (LANTUS SOLOSTAR) 100 UNIT/ML Solostar Pen Inject 70 Units into the skin daily. 40 units in the morning and 30 units in the evening   JANUVIA 100 MG tablet TAKE 1 TABLET BY MOUTH EVERY DAY   levothyroxine (SYNTHROID) 125 MCG tablet Take 1 tablet (125 mcg total) by mouth daily.   pantoprazole (PROTONIX) 40 MG tablet Take 1 tablet (40 mg total) by mouth daily.   trimethoprim (TRIMPEX) 100 MG tablet TAKE 1 TABLET BY MOUTH EVERY DAY   vitamin B-12 (CYANOCOBALAMIN) 1000 MCG tablet Take 1 tablet (1,000 mcg total) by mouth daily.   No facility-administered encounter medications on file as of 02/06/2021.    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   Recurrent UTI    Dyslipidemia    Orthostasis    Dysarthria    Vagina, candidiasis 03/10/2019   Dementia ([place]) 03/10/2019   Acute respiratory failure with hypoxia ([place]) 12/14/2534   Acute metabolic encephalopathy 64/40/3474   COVID-19 virus infection  01/15/2019   Acute lower UTI 01/15/2019   B12 deficiency 01/22/2018   History of CVA (cerebrovascular accident) 06/20/2017   Stenosis of right carotid artery    Peripheral edema 07/29/2016   Imbalance 04/07/2016   Intertriginous candidiasis 04/26/2015   Hard of hearing 04/12/2015   TIA (transient ischemic attack) 04/10/2015   History of recurrent UTIs 04/10/2015   Dysuria 03/09/2015   Tremor 05/11/2014   Leg weakness, bilateral 05/11/2014   Fatty  liver 08/04/2013   Personal history of colonic polyps 10/05/2012   Esophageal reflux 10/05/2012   Carotid stenosis, symptomatic, with infarction ([place]) 10/14/2010   MDD (major depressive disorder), single episode, moderate ([place]) 01/13/2008   Essential hypertension, benign 01/13/2008   PAROXYSMAL ATRIAL FIBRILLATION 01/13/2008   Intracranial vascular stenosis 01/13/2008   DIVERTICULOSIS OF COLON 01/13/2008   Fibromyalgia 01/13/2008   CHEST PAIN, ATYPICAL 01/13/2008   Hypothyroidism 04/13/2007   Poorly controlled type 2 diabetes mellitus with complication ([place]) 85/88/5027   Vitamin D deficiency 04/13/2007   Hyperlipidemia LDL goal <70 04/13/2007   DEGENERATIVE JOINT DISEASE 04/13/2007    Conditions to be addressed/monitored: DMII and Dementia; Limited social support, Limited access to caregiver, and Lacks knowledge of community resource:    Care Plan : LCSW Plan of Care  Updates made by Jaclyn Peer, LCSW since 02/06/2021 12:00 AM     Problem: Social and Functional Symptoms   Priority: High     Long-Range Goal: Educate and provide resources and support to aide in patient's overall QOL   Start Date: 02/06/2021  Expected End Date: 03/18/2021  This Visit's Progress: On track  Priority: High  Note:   Current SDOH Barriers:  Financial constraints related to limited income , Limited social support, Transportation, Medication procurement, Social Isolation, Limited access to caregiver, Cognitive Deficits, and Lacks knowledge of community resource:   Lacks social connections Son seeking  in home help when he is at work  Clinical Social Work Clinical Goal(s):  patient will work with care management team to identify and address any acute and/or chronic care coordination needs related to the self health management of DM and Dementia and Financial constraints , Limited social support, Transportation, Medication procurement, Social Isolation, Limited access to caregiver, and Lacks  knowledge of community resource:  patient will work with SW to address concerns related to possible in home caregiver/Medicaid eligibility,   Interventions: CSW made initial call and spoke with pt's son, [name], caregiver.  Per [name], he is seeking help with getting care for pt at home- CSW inquired about pt's income and assets and is likely not eligible for Medicaid based on the amount of SS income she gets as well as "2 pieces of land".  Discussed with son and suggested we reasses after the new year to see if her income and the guidelines for Medicaid change enough to make a difference. Per son, pt does not have LTC insurance nor is she eligible for VA benefits/programs.  Pt's son became upset with the facts and shared that he has other family who are getting help and they have more than she does". Encouraged him to inquire with them for assistance.  Son reports she struggles to pay her RX copays- one being Eliquis- will ask Pharmacy to assist if able.  Patient interviewed and SDOH assessment performed inter-disciplinary care team collaboration (see longitudinal plan of care) Collaboration with Jinny Sanders, MD regarding development and update of comprehensive plan of care as evidenced by provider attestation and co-signature Patient interviewed and  appropriate assessments performed Referred patient to community resources care guide team for assistance with community resources- life alert, senior centers, transportation, etc Provided patient with information about Medicaid guidelines, insurance options/benefits, etc  Patient Self Care Activities:  Patient will attend all scheduled provider appointments  Follow Up Plan: Telephone follow up appointment with care management team member scheduled for: 02/26/21      Follow Up Plan: SW will follow up with patient by phone over the next 2-3 weeks      Eduard Clos MSW, [place] Licensed Clinical Social Worker [place] [phone]

## 2021-02-07 ENCOUNTER — Other Ambulatory Visit: Payer: Self-pay | Admitting: Family Medicine

## 2021-02-07 ENCOUNTER — Telehealth: Payer: Self-pay | Admitting: *Deleted

## 2021-02-07 DIAGNOSIS — E1165 Type 2 diabetes mellitus with hyperglycemia: Secondary | ICD-10-CM

## 2021-02-07 DIAGNOSIS — F01518 Vascular dementia, unspecified severity, with other behavioral disturbance: Secondary | ICD-10-CM

## 2021-02-07 NOTE — Progress Notes (Signed)
Referral to Home health placed.

## 2021-02-13 NOTE — Telephone Encounter (Signed)
° °  Telephone encounter was:  Successful.  02/13/2021 Name: Jaclyn [name] MRN: [id number] DOB: 07/10/1935  Jaclyn [name] is a 85 y.o. year old female who is a primary care patient of Bedsole, Amy E, MD . The community resource team was consulted for assistance with Caregiver Stress  Care guide performed the following interventions: Patient provided with information about care guide support team and interviewed to confirm resource needs.  Follow Up Plan:  Care guide will follow up with patient by phone over the next day  [place], Care Management  [phone] [address] , [place] [address] Email : [email or web address]

## 2021-02-14 ENCOUNTER — Telehealth: Payer: Self-pay | Admitting: *Deleted

## 2021-02-14 NOTE — Telephone Encounter (Signed)
° °  Telephone encounter was:  Successful.  02/14/2021 Name: Charleston Vierling MRN: [id number] DOB: 1935-07-17  Jaclyn [name] is a 85 y.o. year old female who is a primary care patient of Bedsole, Amy E, MD . The community resource team was consulted for assistance with Will mail son requested information , medical a lert info , transportaion and senior services  Care guide performed the following interventions: Patient provided with information about care guide support team and interviewed to confirm resource needs Follow up call placed to community resources to determine status of patients referral.  Follow Up Plan:  No further follow up planned at this time. The patient has been provided with needed resources.  [place], Care Management  [phone] [address] , [place] [address] Email : [email or web address]

## 2021-02-15 ENCOUNTER — Other Ambulatory Visit: Payer: Self-pay

## 2021-02-15 ENCOUNTER — Inpatient Hospital Stay (HOSPITAL_COMMUNITY)
Admission: EM | Admit: 2021-02-15 | Discharge: 2021-02-25 | DRG: 643 | Disposition: A | Discharge status: Skilled Nursing Facility | Payer: Medicare Other | Attending: Internal Medicine | Admitting: Internal Medicine

## 2021-02-15 ENCOUNTER — Encounter (HOSPITAL_COMMUNITY): Payer: Self-pay

## 2021-02-15 ENCOUNTER — Emergency Department (HOSPITAL_COMMUNITY): Payer: Medicare Other

## 2021-02-15 DIAGNOSIS — L8995 Pressure ulcer of unspecified site, unstageable: Secondary | ICD-10-CM | POA: Diagnosis not present

## 2021-02-15 DIAGNOSIS — F03A0 Unspecified dementia, mild, without behavioral disturbance, psychotic disturbance, mood disturbance, and anxiety: Secondary | ICD-10-CM | POA: Diagnosis present

## 2021-02-15 DIAGNOSIS — Z7189 Other specified counseling: Secondary | ICD-10-CM | POA: Diagnosis not present

## 2021-02-15 DIAGNOSIS — Z792 Long term (current) use of antibiotics: Secondary | ICD-10-CM | POA: Diagnosis not present

## 2021-02-15 DIAGNOSIS — I480 Paroxysmal atrial fibrillation: Secondary | ICD-10-CM | POA: Diagnosis not present

## 2021-02-15 DIAGNOSIS — Z8673 Personal history of transient ischemic attack (TIA), and cerebral infarction without residual deficits: Secondary | ICD-10-CM | POA: Diagnosis not present

## 2021-02-15 DIAGNOSIS — I4891 Unspecified atrial fibrillation: Secondary | ICD-10-CM | POA: Diagnosis present

## 2021-02-15 DIAGNOSIS — Z91040 Latex allergy status: Secondary | ICD-10-CM

## 2021-02-15 DIAGNOSIS — R32 Unspecified urinary incontinence: Secondary | ICD-10-CM | POA: Diagnosis present

## 2021-02-15 DIAGNOSIS — R531 Weakness: Secondary | ICD-10-CM | POA: Diagnosis not present

## 2021-02-15 DIAGNOSIS — R824 Acetonuria: Secondary | ICD-10-CM

## 2021-02-15 DIAGNOSIS — F05 Delirium due to known physiological condition: Secondary | ICD-10-CM | POA: Diagnosis not present

## 2021-02-15 DIAGNOSIS — E46 Unspecified protein-calorie malnutrition: Secondary | ICD-10-CM | POA: Diagnosis not present

## 2021-02-15 DIAGNOSIS — E860 Dehydration: Secondary | ICD-10-CM | POA: Diagnosis present

## 2021-02-15 DIAGNOSIS — Z7901 Long term (current) use of anticoagulants: Secondary | ICD-10-CM | POA: Diagnosis not present

## 2021-02-15 DIAGNOSIS — Z882 Allergy status to sulfonamides status: Secondary | ICD-10-CM

## 2021-02-15 DIAGNOSIS — F0390 Unspecified dementia without behavioral disturbance: Secondary | ICD-10-CM | POA: Diagnosis not present

## 2021-02-15 DIAGNOSIS — Z91013 Allergy to seafood: Secondary | ICD-10-CM

## 2021-02-15 DIAGNOSIS — E1165 Type 2 diabetes mellitus with hyperglycemia: Secondary | ICD-10-CM | POA: Diagnosis present

## 2021-02-15 DIAGNOSIS — R262 Difficulty in walking, not elsewhere classified: Secondary | ICD-10-CM | POA: Diagnosis not present

## 2021-02-15 DIAGNOSIS — I10 Essential (primary) hypertension: Secondary | ICD-10-CM | POA: Diagnosis not present

## 2021-02-15 DIAGNOSIS — Z888 Allergy status to other drugs, medicaments and biological substances status: Secondary | ICD-10-CM | POA: Diagnosis not present

## 2021-02-15 DIAGNOSIS — E118 Type 2 diabetes mellitus with unspecified complications: Secondary | ICD-10-CM | POA: Diagnosis not present

## 2021-02-15 DIAGNOSIS — F01518 Vascular dementia, unspecified severity, with other behavioral disturbance: Secondary | ICD-10-CM | POA: Diagnosis not present

## 2021-02-15 DIAGNOSIS — R627 Adult failure to thrive: Secondary | ICD-10-CM | POA: Diagnosis present

## 2021-02-15 DIAGNOSIS — R4701 Aphasia: Secondary | ICD-10-CM | POA: Diagnosis not present

## 2021-02-15 DIAGNOSIS — Z803 Family history of malignant neoplasm of breast: Secondary | ICD-10-CM | POA: Diagnosis not present

## 2021-02-15 DIAGNOSIS — N390 Urinary tract infection, site not specified: Secondary | ICD-10-CM | POA: Diagnosis present

## 2021-02-15 DIAGNOSIS — E039 Hypothyroidism, unspecified: Principal | ICD-10-CM | POA: Diagnosis present

## 2021-02-15 DIAGNOSIS — R4182 Altered mental status, unspecified: Secondary | ICD-10-CM | POA: Diagnosis not present

## 2021-02-15 DIAGNOSIS — Z8249 Family history of ischemic heart disease and other diseases of the circulatory system: Secondary | ICD-10-CM

## 2021-02-15 DIAGNOSIS — R270 Ataxia, unspecified: Secondary | ICD-10-CM | POA: Diagnosis present

## 2021-02-15 DIAGNOSIS — Z823 Family history of stroke: Secondary | ICD-10-CM

## 2021-02-15 DIAGNOSIS — G928 Other toxic encephalopathy: Secondary | ICD-10-CM | POA: Diagnosis not present

## 2021-02-15 DIAGNOSIS — Z20822 Contact with and (suspected) exposure to covid-19: Secondary | ICD-10-CM | POA: Diagnosis not present

## 2021-02-15 DIAGNOSIS — T381X6A Underdosing of thyroid hormones and substitutes, initial encounter: Secondary | ICD-10-CM | POA: Diagnosis not present

## 2021-02-15 DIAGNOSIS — M797 Fibromyalgia: Secondary | ICD-10-CM | POA: Diagnosis not present

## 2021-02-15 DIAGNOSIS — E785 Hyperlipidemia, unspecified: Secondary | ICD-10-CM | POA: Diagnosis present

## 2021-02-15 DIAGNOSIS — G9341 Metabolic encephalopathy: Secondary | ICD-10-CM | POA: Diagnosis not present

## 2021-02-15 DIAGNOSIS — R638 Other symptoms and signs concerning food and fluid intake: Secondary | ICD-10-CM | POA: Diagnosis not present

## 2021-02-15 DIAGNOSIS — Z515 Encounter for palliative care: Secondary | ICD-10-CM | POA: Diagnosis not present

## 2021-02-15 DIAGNOSIS — Z807 Family history of other malignant neoplasms of lymphoid, hematopoietic and related tissues: Secondary | ICD-10-CM

## 2021-02-15 DIAGNOSIS — Z83438 Family history of other disorder of lipoprotein metabolism and other lipidemia: Secondary | ICD-10-CM

## 2021-02-15 DIAGNOSIS — R739 Hyperglycemia, unspecified: Secondary | ICD-10-CM

## 2021-02-15 DIAGNOSIS — M6281 Muscle weakness (generalized): Secondary | ICD-10-CM | POA: Diagnosis not present

## 2021-02-15 DIAGNOSIS — E7132 Disorders of ketone metabolism: Secondary | ICD-10-CM | POA: Diagnosis not present

## 2021-02-15 DIAGNOSIS — Z7401 Bed confinement status: Secondary | ICD-10-CM | POA: Diagnosis not present

## 2021-02-15 DIAGNOSIS — E119 Type 2 diabetes mellitus without complications: Secondary | ICD-10-CM | POA: Diagnosis present

## 2021-02-15 DIAGNOSIS — F321 Major depressive disorder, single episode, moderate: Secondary | ICD-10-CM | POA: Diagnosis not present

## 2021-02-15 DIAGNOSIS — Z1159 Encounter for screening for other viral diseases: Secondary | ICD-10-CM | POA: Diagnosis not present

## 2021-02-15 DIAGNOSIS — E222 Syndrome of inappropriate secretion of antidiuretic hormone: Secondary | ICD-10-CM | POA: Diagnosis present

## 2021-02-15 DIAGNOSIS — Z86010 Personal history of colonic polyps: Secondary | ICD-10-CM | POA: Diagnosis not present

## 2021-02-15 DIAGNOSIS — I959 Hypotension, unspecified: Secondary | ICD-10-CM | POA: Diagnosis not present

## 2021-02-15 LAB — COMPREHENSIVE METABOLIC PANEL
ALT: 15 U/L (ref 0–44)
AST: 13 U/L — ABNORMAL LOW (ref 15–41)
Albumin: 3.2 g/dL — ABNORMAL LOW (ref 3.5–5.0)
Alkaline Phosphatase: 73 U/L (ref 38–126)
Anion gap: 13 (ref 5–15)
BUN: 11 mg/dL (ref 8–23)
CO2: 22 mmol/L (ref 22–32)
Calcium: 8.8 mg/dL — ABNORMAL LOW (ref 8.9–10.3)
Chloride: 95 mmol/L — ABNORMAL LOW (ref 98–111)
Creatinine, Ser: 0.9 mg/dL (ref 0.44–1.00)
GFR, Estimated: >60 mL/min (ref >60)
Glucose, Bld: 356 mg/dL — ABNORMAL HIGH (ref 70–99)
Potassium: 3.9 mmol/L (ref 3.5–5.1)
Sodium: 130 mmol/L — ABNORMAL LOW (ref 135–145)
Total Bilirubin: 1.6 mg/dL — ABNORMAL HIGH (ref 0.3–1.2)
Total Protein: 6.8 g/dL (ref 6.5–8.1)

## 2021-02-15 LAB — I-STAT VENOUS BLOOD GAS, ED
Acid-base deficit: 1 mmol/L (ref 0.0–2.0)
Bicarbonate: 24.3 mmol/L (ref 20.0–28.0)
Calcium, Ion: 1.17 mmol/L (ref 1.15–1.40)
HCT: 35 % — ABNORMAL LOW (ref 36.0–46.0)
Hemoglobin: 11.9 g/dL — ABNORMAL LOW (ref 12.0–15.0)
O2 Saturation: 85 %
Potassium: 4.1 mmol/L (ref 3.5–5.1)
Sodium: 134 mmol/L — ABNORMAL LOW (ref 135–145)
TCO2: 26 mmol/L (ref 22–32)
pCO2, Ven: 42.3 mmHg — ABNORMAL LOW (ref 44.0–60.0)
pH, Ven: 7.368 (ref 7.250–7.430)
pO2, Ven: 52 mmHg — ABNORMAL HIGH (ref 32.0–45.0)

## 2021-02-15 LAB — CBC WITH DIFFERENTIAL/PLATELET
Abs Immature Granulocytes: 0.05 10*3/uL (ref 0.00–0.07)
Basophils Absolute: 0 10*3/uL (ref 0.0–0.1)
Basophils Relative: 0 %
Eosinophils Absolute: 0 10*3/uL (ref 0.0–0.5)
Eosinophils Relative: 1 %
HCT: 35.2 % — ABNORMAL LOW (ref 36.0–46.0)
Hemoglobin: 12.6 g/dL (ref 12.0–15.0)
Immature Granulocytes: 1 %
Lymphocytes Relative: 18 %
Lymphs Abs: 1.1 10*3/uL (ref 0.7–4.0)
MCH: 33.2 pg (ref 26.0–34.0)
MCHC: 35.8 g/dL (ref 30.0–36.0)
MCV: 92.6 fL (ref 80.0–100.0)
Monocytes Absolute: 0.7 10*3/uL (ref 0.1–1.0)
Monocytes Relative: 11 %
Neutro Abs: 4.5 10*3/uL (ref 1.7–7.7)
Neutrophils Relative %: 69 %
Platelets: 247 10*3/uL (ref 150–400)
RBC: 3.8 MIL/uL — ABNORMAL LOW (ref 3.87–5.11)
RDW: 14.3 % (ref 11.5–15.5)
WBC: 6.4 10*3/uL (ref 4.0–10.5)
nRBC: 0 % (ref 0.0–0.2)

## 2021-02-15 LAB — RESP PANEL BY RT-PCR (FLU A&B, COVID) ARPGX2
Influenza A by PCR: NEGATIVE
Influenza B by PCR: NEGATIVE
SARS Coronavirus 2 by RT PCR: NEGATIVE

## 2021-02-15 LAB — CBG MONITORING, ED: Glucose-Capillary: 336 mg/dL — ABNORMAL HIGH (ref 70–99)

## 2021-02-15 MED ORDER — SODIUM CHLORIDE 0.9 % IV BOLUS
1000.0000 mL | Freq: Once | INTRAVENOUS | Status: AC
  Administered 2021-02-15: 23:00:00 1000 mL via INTRAVENOUS

## 2021-02-15 NOTE — ED Notes (Signed)
Patient transported to CT 

## 2021-02-15 NOTE — ED Provider Notes (Signed)
[facility] EMERGENCY DEPARTMENT Provider Note   CSN: [id number] Arrival date & time: 02/15/21  1722     History Chief Complaint  Patient presents with   Failure To Thrive    Jaclyn [name] is a 85 y.o. female.  HPI  85 year old female presents emergency department by EMS for reported failure to thrive.  Patient states that since Thanksgiving she has been steadily declining.  She feels tired all the time, states that she is confused at times.  States she needs more assistance.  Report from EMS is that she was covered in urine with a strong smell on their arrival.  Patient admits to decreased appetite and p.o. intake but denies any acute complaints including chest pain, shortness of breath, abdominal pain, vomiting/diarrhea.  Son states that she has had a significant decline in mobility, cannot even stand pivot on her own anymore.  Past Medical History:  Diagnosis Date   Atrial fibrillation ([place])    Benign neoplasm of colon    Carotid artery occlusion    60-79% right ICA stenosis   Carotid bruit    Cerebrovascular disease, unspecified    Difficult intubation 2008   During surgery to remove large polyp   Diverticulosis of colon (without mention of hemorrhage)    Dysthymic disorder    Fibromyalgia    Headache(784.0)    Insomnia, unspecified    Interstitial cystitis    Myalgia and myositis, unspecified    Osteoarthrosis, unspecified whether generalized or localized, unspecified site    Other and unspecified hyperlipidemia    Other chest pain    Other specified benign mammary dysplasias    TIA (transient ischemic attack)    Type II or unspecified type diabetes mellitus without mention of complication, not stated as uncontrolled    Unspecified essential hypertension    Unspecified hypothyroidism    Unspecified vitamin D deficiency    Urinary tract infection, site not specified     Patient Active Problem List   Diagnosis Date Noted   Mixed stress and  urge urinary incontinence 02/05/2021   Yeast infection 12/03/2020   Hypokalemia    Labile blood glucose    Uncontrolled type 2 diabetes mellitus with hyperglycemia (HCC)    Orthostatic hypotension    Elevated blood pressure reading    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   Recurrent UTI    Dyslipidemia    Orthostasis    Dysarthria    Vagina, candidiasis 03/10/2019   Dementia ([place]) 03/10/2019   Acute respiratory failure with hypoxia ([place]) 67/59/1638   Acute metabolic encephalopathy 46/65/9935   COVID-19 virus infection 01/15/2019   Acute lower UTI 01/15/2019   B12 deficiency 01/22/2018   History of CVA (cerebrovascular accident) 06/20/2017   Stenosis of right carotid artery    Peripheral edema 07/29/2016   Imbalance 04/07/2016   Intertriginous candidiasis 04/26/2015   Hard of hearing 04/12/2015   TIA (transient ischemic attack) 04/10/2015   History of recurrent UTIs 04/10/2015   Dysuria 03/09/2015   Tremor 05/11/2014   Leg weakness, bilateral 05/11/2014   Fatty liver 08/04/2013   Personal history of colonic polyps 10/05/2012   Esophageal reflux 10/05/2012   Carotid stenosis, symptomatic, with infarction ([place]) 10/14/2010   MDD (major depressive disorder), single episode, moderate ([place]) 01/13/2008   Essential hypertension, benign 01/13/2008   PAROXYSMAL ATRIAL FIBRILLATION 01/13/2008   Intracranial vascular stenosis 01/13/2008   DIVERTICULOSIS OF COLON 01/13/2008   Fibromyalgia 01/13/2008   CHEST PAIN, ATYPICAL 01/13/2008   Hypothyroidism 04/13/2007  Poorly controlled type 2 diabetes mellitus with complication ([place]) 65/78/4696   Vitamin D deficiency 04/13/2007   Hyperlipidemia LDL goal <70 04/13/2007   DEGENERATIVE JOINT DISEASE 04/13/2007    Past Surgical History:  Procedure Laterality Date   ABDOMINAL HYSTERECTOMY  1988   cervical dysplasia   CARDIAC CATHETERIZATION  02/19/06   EF 60%   COLONOSCOPY     ENDARTERECTOMY Right 08/26/2019   Procedure:  RIGHT CAROTID ENDARTERECTOMY;  Surgeon: Rosetta Posner, MD;  Location: [facility] OR;  Service: Vascular;  Laterality: Right;   PATCH ANGIOPLASTY Right 08/26/2019   Procedure: PATCH ANGIOPLASTY OF RIGHT COMMON CAROTID ARTERY USING HEMASHIELD PLATINUM FINESSE PATCH;  Surgeon: Rosetta Posner, MD;  Location: [facility] OR;  Service: Vascular;  Laterality: Right;   RIGHT COLECTOMY  2005   for villous adenoma of the cecum Dr.streck   VESICOVAGINAL FISTULA CLOSURE W/ [place]   w/ cystocele &retocele repairs Dr. Ree Edman     OB History   No obstetric history on file.     Family History  Problem Relation Age of Onset   Lymphoma Father    Hypertension Father    Stroke Father    Hyperlipidemia Father    Hypertension Mother    Allergies Brother    Hypertension Sister        3   Breast cancer Sister    Fibromyalgia Sister        3   Hyperlipidemia Sister        3    Social History   Tobacco Use   Smoking status: Never   Smokeless tobacco: Never  Substance Use Topics   Alcohol use: No    Alcohol/week: 0.0 standard drinks   Drug use: No    Home Medications Prior to Admission medications   Medication Sig Start Date End Date Taking? Authorizing Provider  apixaban (ELIQUIS) 5 MG TABS tablet TAKE 1 TABLET BY MOUTH TWICE A DAY 12/11/20   Bedsole, Amy E, MD  B-D ULTRAFINE III SHORT PEN 31G X 8 MM MISC USE TO INJECT INSULIN ONCE DAILY. DX: E11.65 07/02/20   Bedsole, Amy E, MD  Cholecalciferol (VITAMIN D3) 1.25 MG (50000 UT) CAPS Take 1 capsule by mouth once a week. 12/27/20   Bedsole, Amy E, MD  DULoxetine (CYMBALTA) 30 MG capsule TAKE 2 CAPSULES BY MOUTH EVERY DAY 12/27/19   Bedsole, Amy E, MD  insulin glargine (LANTUS SOLOSTAR) 100 UNIT/ML Solostar Pen Inject 70 Units into the skin daily. 40 units in the morning and 30 units in the evening 12/09/20   Bedsole, Amy E, MD  JANUVIA 100 MG tablet TAKE 1 TABLET BY MOUTH EVERY DAY 01/07/21   Bedsole, Amy E, MD  levothyroxine (SYNTHROID) 125 MCG tablet Take 1  tablet (125 mcg total) by mouth daily. 12/27/20   Bedsole, Amy E, MD  pantoprazole (PROTONIX) 40 MG tablet Take 1 tablet (40 mg total) by mouth daily. 09/10/19   Angiulli, Lavon Paganini, PA-C  trimethoprim (TRIMPEX) 100 MG tablet TAKE 1 TABLET BY MOUTH EVERY DAY 12/12/20   Bedsole, Amy E, MD  vitamin B-12 (CYANOCOBALAMIN) 1000 MCG tablet Take 1 tablet (1,000 mcg total) by mouth daily. 09/09/19   Angiulli, Lavon Paganini, PA-C    Allergies    Naproxen sodium, Statins, Sulfonamide derivatives, Latex, and Shellfish allergy  Review of Systems   Review of Systems  Constitutional:  Negative for chills and fever.  HENT:  Negative for congestion.   Eyes:  Negative for visual disturbance.  Respiratory:  Negative for shortness of breath.   Cardiovascular:  Negative for chest pain.  Gastrointestinal:  Negative for abdominal pain, diarrhea and vomiting.  Genitourinary:  Positive for frequency. Negative for dysuria.  Musculoskeletal:  Negative for neck pain.  Neurological:  Negative for facial asymmetry, speech difficulty, weakness, numbness and headaches.  Psychiatric/Behavioral:  Positive for confusion, decreased concentration and sleep disturbance. Negative for hallucinations. The patient is not nervous/anxious.    Physical Exam Updated Vital Signs BP (!) 163/79    Pulse 90    Temp 98.1 F (36.7 C) (Oral)    Resp 16    Ht 5\' 2"  (1.575 m)    Wt 68.5 kg    SpO2 97%    BMI 27.62 kg/m   Physical Exam Vitals and nursing note reviewed.  Constitutional:      General: She is not in acute distress.    Appearance: Normal appearance.  HENT:     Head: Normocephalic.     Mouth/Throat:     Mouth: Mucous membranes are moist.  Eyes:     Pupils: Pupils are equal, round, and reactive to light.  Cardiovascular:     Rate and Rhythm: Normal rate.  Pulmonary:     Effort: Pulmonary effort is normal. No respiratory distress.  Abdominal:     Palpations: Abdomen is soft.     Tenderness: There is no abdominal tenderness.   Musculoskeletal:        General: No swelling or deformity.     Cervical back: No rigidity.  Skin:    General: Skin is warm.  Neurological:     Mental Status: She is alert and oriented to person, place, and time.     Cranial Nerves: No cranial nerve deficit.     Motor: No weakness.  Psychiatric:        Mood and Affect: Mood normal.    ED Results / Procedures / Treatments   Labs (all labs ordered are listed, but only abnormal results are displayed) Labs Reviewed  RESP PANEL BY RT-PCR (FLU A&B, COVID) [id number]  CBC WITH DIFFERENTIAL/PLATELET  COMPREHENSIVE METABOLIC PANEL  URINALYSIS, ROUTINE W REFLEX MICROSCOPIC  I-STAT VENOUS BLOOD GAS, ED  CBG MONITORING, ED    EKG None  Radiology No results found.  Procedures Procedures   Medications Ordered in ED Medications - No data to display  ED Course  I have reviewed the triage vital signs and the nursing notes.  Pertinent labs & imaging results that were available during my care of the patient were reviewed by me and considered in my medical decision making (see chart for details).    MDM Rules/Calculators/A&P                          85 year old female presents emergency department with failure to thrive, difficulty with mobility, unable to stand on her own now. No current home assistance. Son lives with her but works daily.  She is weak on exam but not acutely ill-appearing.  Very dry mucous membranes.  Seems oriented but at times confused.  Hard of hearing.  Physical exam is otherwise reassuring besides global weakness.  Blood work is reassuring, we are pending urinalysis to rule out UTI.  If this is positive expect medical admission.  If this is negative patient does not appear to be a safe discharge home and will require PT/social work.  Patient pending UA.     Final Clinical Impression(s) / ED Diagnoses Final diagnoses:  None  Rx / DC Orders ED Discharge Orders     None        Lorelle Gibbs,  DO 02/16/21 0021

## 2021-02-15 NOTE — ED Triage Notes (Signed)
Pt BIB [facility] from home d/t failure to thrive. Per EMS, pt has been slowly declining since Thanksgiving. Pt appears more lethargic than normal. Pt does not receive adequate care at home, stated pt needs snf placement. Pt was covered in urine upon EMS arrival.   BP 124/76 PULSE 76  99% RA

## 2021-02-16 ENCOUNTER — Encounter (HOSPITAL_COMMUNITY): Payer: Self-pay | Admitting: Internal Medicine

## 2021-02-16 DIAGNOSIS — R638 Other symptoms and signs concerning food and fluid intake: Secondary | ICD-10-CM | POA: Diagnosis not present

## 2021-02-16 DIAGNOSIS — Z86010 Personal history of colonic polyps: Secondary | ICD-10-CM | POA: Diagnosis not present

## 2021-02-16 DIAGNOSIS — I480 Paroxysmal atrial fibrillation: Secondary | ICD-10-CM | POA: Diagnosis present

## 2021-02-16 DIAGNOSIS — N390 Urinary tract infection, site not specified: Secondary | ICD-10-CM | POA: Diagnosis present

## 2021-02-16 DIAGNOSIS — R531 Weakness: Secondary | ICD-10-CM | POA: Diagnosis not present

## 2021-02-16 DIAGNOSIS — F01518 Vascular dementia, unspecified severity, with other behavioral disturbance: Secondary | ICD-10-CM

## 2021-02-16 DIAGNOSIS — E1165 Type 2 diabetes mellitus with hyperglycemia: Secondary | ICD-10-CM | POA: Diagnosis not present

## 2021-02-16 DIAGNOSIS — Z515 Encounter for palliative care: Secondary | ICD-10-CM | POA: Diagnosis not present

## 2021-02-16 DIAGNOSIS — Z882 Allergy status to sulfonamides status: Secondary | ICD-10-CM | POA: Diagnosis not present

## 2021-02-16 DIAGNOSIS — R270 Ataxia, unspecified: Secondary | ICD-10-CM | POA: Diagnosis present

## 2021-02-16 DIAGNOSIS — Z888 Allergy status to other drugs, medicaments and biological substances status: Secondary | ICD-10-CM | POA: Diagnosis not present

## 2021-02-16 DIAGNOSIS — Z792 Long term (current) use of antibiotics: Secondary | ICD-10-CM | POA: Diagnosis not present

## 2021-02-16 DIAGNOSIS — Z7189 Other specified counseling: Secondary | ICD-10-CM | POA: Diagnosis not present

## 2021-02-16 DIAGNOSIS — Z20822 Contact with and (suspected) exposure to covid-19: Secondary | ICD-10-CM | POA: Diagnosis present

## 2021-02-16 DIAGNOSIS — F05 Delirium due to known physiological condition: Secondary | ICD-10-CM | POA: Diagnosis not present

## 2021-02-16 DIAGNOSIS — G928 Other toxic encephalopathy: Secondary | ICD-10-CM | POA: Diagnosis present

## 2021-02-16 DIAGNOSIS — T381X6A Underdosing of thyroid hormones and substitutes, initial encounter: Secondary | ICD-10-CM | POA: Diagnosis present

## 2021-02-16 DIAGNOSIS — I10 Essential (primary) hypertension: Secondary | ICD-10-CM | POA: Diagnosis present

## 2021-02-16 DIAGNOSIS — R627 Adult failure to thrive: Secondary | ICD-10-CM

## 2021-02-16 DIAGNOSIS — Z8673 Personal history of transient ischemic attack (TIA), and cerebral infarction without residual deficits: Secondary | ICD-10-CM | POA: Diagnosis not present

## 2021-02-16 DIAGNOSIS — E118 Type 2 diabetes mellitus with unspecified complications: Secondary | ICD-10-CM | POA: Diagnosis not present

## 2021-02-16 DIAGNOSIS — E039 Hypothyroidism, unspecified: Secondary | ICD-10-CM | POA: Diagnosis present

## 2021-02-16 DIAGNOSIS — E222 Syndrome of inappropriate secretion of antidiuretic hormone: Secondary | ICD-10-CM | POA: Diagnosis present

## 2021-02-16 DIAGNOSIS — Z7901 Long term (current) use of anticoagulants: Secondary | ICD-10-CM | POA: Diagnosis not present

## 2021-02-16 DIAGNOSIS — E860 Dehydration: Secondary | ICD-10-CM | POA: Diagnosis present

## 2021-02-16 DIAGNOSIS — R32 Unspecified urinary incontinence: Secondary | ICD-10-CM | POA: Diagnosis present

## 2021-02-16 DIAGNOSIS — F03A0 Unspecified dementia, mild, without behavioral disturbance, psychotic disturbance, mood disturbance, and anxiety: Secondary | ICD-10-CM | POA: Diagnosis present

## 2021-02-16 DIAGNOSIS — E785 Hyperlipidemia, unspecified: Secondary | ICD-10-CM | POA: Diagnosis present

## 2021-02-16 DIAGNOSIS — Z803 Family history of malignant neoplasm of breast: Secondary | ICD-10-CM | POA: Diagnosis not present

## 2021-02-16 LAB — URINALYSIS, ROUTINE W REFLEX MICROSCOPIC
Bilirubin Urine: NEGATIVE
Glucose, UA: >=500 mg/dL — AB
Ketones, ur: >80 mg/dL — AB
Nitrite: NEGATIVE
Protein, ur: 30 mg/dL — AB
Specific Gravity, Urine: >1.03 — ABNORMAL HIGH (ref 1.005–1.030)
pH: 5 (ref 5.0–8.0)

## 2021-02-16 LAB — CBG MONITORING, ED
Glucose-Capillary: 226 mg/dL — ABNORMAL HIGH (ref 70–99)
Glucose-Capillary: 439 mg/dL — ABNORMAL HIGH (ref 70–99)
Glucose-Capillary: 439 mg/dL — ABNORMAL HIGH (ref 70–99)

## 2021-02-16 LAB — T4, FREE: Free T4: 0.55 ng/dL — ABNORMAL LOW (ref 0.61–1.12)

## 2021-02-16 LAB — URINALYSIS, MICROSCOPIC (REFLEX): WBC, UA: >50 WBC/hpf (ref 0–5)

## 2021-02-16 LAB — TSH: TSH: 22.31 u[IU]/mL — ABNORMAL HIGH (ref 0.350–4.500)

## 2021-02-16 MED ORDER — APIXABAN 5 MG PO TABS
5.0000 mg | ORAL_TABLET | Freq: Two times a day (BID) | ORAL | Status: DC
  Administered 2021-02-16 – 2021-02-25 (×18): 5 mg via ORAL
  Filled 2021-02-16 (×18): qty 1

## 2021-02-16 MED ORDER — HYDRALAZINE HCL 20 MG/ML IJ SOLN: 5.0000 mg | INTRAMUSCULAR | Status: DC | PRN

## 2021-02-16 MED ORDER — INSULIN ASPART 100 UNIT/ML IJ SOLN
0.0000 [IU] | Freq: Every day | INTRAMUSCULAR | Status: DC
  Administered 2021-02-16: 5 [IU] via SUBCUTANEOUS

## 2021-02-16 MED ORDER — INSULIN GLARGINE 100 UNIT/ML SOLOSTAR PEN: 35.0000 [IU] | PEN_INJECTOR | Freq: Two times a day (BID) | SUBCUTANEOUS | Status: DC

## 2021-02-16 MED ORDER — ONDANSETRON HCL 4 MG PO TABS: 4.0000 mg | ORAL_TABLET | Freq: Four times a day (QID) | ORAL | Status: DC | PRN

## 2021-02-16 MED ORDER — DOCUSATE SODIUM 100 MG PO CAPS
100.0000 mg | ORAL_CAPSULE | Freq: Two times a day (BID) | ORAL | Status: DC
  Administered 2021-02-17 – 2021-02-25 (×14): 100 mg via ORAL
  Filled 2021-02-16 (×15): qty 1

## 2021-02-16 MED ORDER — LACTATED RINGERS IV SOLN: INTRAVENOUS | Status: DC

## 2021-02-16 MED ORDER — OXYCODONE HCL 5 MG PO TABS: 5.0000 mg | ORAL_TABLET | ORAL | Status: DC | PRN

## 2021-02-16 MED ORDER — INSULIN ASPART 100 UNIT/ML IJ SOLN
7.0000 [IU] | Freq: Once | INTRAMUSCULAR | Status: AC
  Administered 2021-02-16: 7 [IU] via SUBCUTANEOUS

## 2021-02-16 MED ORDER — ONDANSETRON HCL 4 MG/2ML IJ SOLN: 4.0000 mg | Freq: Four times a day (QID) | INTRAMUSCULAR | Status: DC | PRN

## 2021-02-16 MED ORDER — MORPHINE SULFATE (PF) 2 MG/ML IV SOLN: 2.0000 mg | INTRAVENOUS | Status: DC | PRN

## 2021-02-16 MED ORDER — BISACODYL 5 MG PO TBEC: 5.0000 mg | DELAYED_RELEASE_TABLET | Freq: Every day | ORAL | Status: DC | PRN

## 2021-02-16 MED ORDER — INSULIN ASPART 100 UNIT/ML IJ SOLN
0.0000 [IU] | Freq: Three times a day (TID) | INTRAMUSCULAR | Status: DC
  Administered 2021-02-17: 5 [IU] via SUBCUTANEOUS
  Administered 2021-02-17: 2 [IU] via SUBCUTANEOUS
  Administered 2021-02-18: 8 [IU] via SUBCUTANEOUS
  Administered 2021-02-18: 11 [IU] via SUBCUTANEOUS
  Administered 2021-02-19: 2 [IU] via SUBCUTANEOUS
  Administered 2021-02-19: 11 [IU] via SUBCUTANEOUS

## 2021-02-16 MED ORDER — ACETAMINOPHEN 325 MG PO TABS: 650.0000 mg | ORAL_TABLET | Freq: Four times a day (QID) | ORAL | Status: DC | PRN

## 2021-02-16 MED ORDER — SODIUM CHLORIDE 0.9 % IV SOLN
1.0000 g | Freq: Once | INTRAVENOUS | Status: AC
  Administered 2021-02-16: 1 g via INTRAVENOUS
  Filled 2021-02-16: qty 10

## 2021-02-16 MED ORDER — LEVOTHYROXINE SODIUM 25 MCG PO TABS
125.0000 ug | ORAL_TABLET | Freq: Every day | ORAL | Status: DC
  Administered 2021-02-17: 125 ug via ORAL
  Filled 2021-02-16: qty 1

## 2021-02-16 MED ORDER — SODIUM CHLORIDE 0.9 % IV SOLN
1.0000 g | INTRAVENOUS | Status: AC
  Administered 2021-02-17 – 2021-02-22 (×6): 1 g via INTRAVENOUS
  Filled 2021-02-16 (×6): qty 10

## 2021-02-16 MED ORDER — POLYETHYLENE GLYCOL 3350 17 G PO PACK: 17.0000 g | PACK | Freq: Every day | ORAL | Status: DC | PRN

## 2021-02-16 MED ORDER — SODIUM CHLORIDE 0.9 % IV BOLUS
500.0000 mL | Freq: Once | INTRAVENOUS | Status: AC
  Administered 2021-02-16: 500 mL via INTRAVENOUS

## 2021-02-16 MED ORDER — SODIUM CHLORIDE 0.9 % IV BOLUS
1000.0000 mL | Freq: Once | INTRAVENOUS | Status: AC
  Administered 2021-02-16: 1000 mL via INTRAVENOUS

## 2021-02-16 MED ORDER — ACETAMINOPHEN 650 MG RE SUPP: 650.0000 mg | Freq: Four times a day (QID) | RECTAL | Status: DC | PRN

## 2021-02-16 MED ORDER — INSULIN ASPART 100 UNIT/ML IJ SOLN
4.0000 [IU] | Freq: Once | INTRAMUSCULAR | Status: AC
  Administered 2021-02-16: 4 [IU] via SUBCUTANEOUS

## 2021-02-16 MED ORDER — DULOXETINE HCL 60 MG PO CPEP
60.0000 mg | ORAL_CAPSULE | Freq: Every day | ORAL | Status: DC
  Administered 2021-02-16 – 2021-02-25 (×10): 60 mg via ORAL
  Filled 2021-02-16 (×10): qty 1

## 2021-02-16 MED ORDER — INSULIN GLARGINE-YFGN 100 UNIT/ML ~~LOC~~ SOLN
35.0000 [IU] | Freq: Two times a day (BID) | SUBCUTANEOUS | Status: DC
  Administered 2021-02-16: 35 [IU] via SUBCUTANEOUS
  Filled 2021-02-16 (×3): qty 0.35

## 2021-02-16 NOTE — ED Notes (Signed)
Patient lying naked on the stretcher covered by blankets.  When trying to do things patient stating "stop that hurts". Patient not wanting to be touched or bothered.

## 2021-02-16 NOTE — Evaluation (Signed)
Physical Therapy Evaluation Patient Details Name: Jaclyn [name] MRN: [id number] DOB: [birth year] Today's Date: 02/16/2021  History of Present Illness  Pt presented to ED on 12/30 with failure to thrive. PMH includes COVID, CVA, dementia, HTN, hypothyroidism, recurrent UTI, arthritis.  Clinical Impression  Pt presents to PT with a month long decline in her mobility to the point she has been basically bed bound prior to presentation to the ED. Pt's son also reports her cognition is currently worse than it has been. Sitting up on the stretcher pt's balance and strength don't look terrible but unable to attempt standing off of stretcher due to the height of stretcher. Feel that if pt can get follow up therapy at SNF that she can hopefully return to ambulatory status.        Recommendations for follow up therapy are one component of a multi-disciplinary discharge planning process, led by the attending physician.  Recommendations may be updated based on patient status, additional functional criteria and insurance authorization.  Follow Up Recommendations Skilled nursing-short term rehab (<3 hours/day)    Assistance Recommended at Discharge Frequent or constant Supervision/Assistance  Functional Status Assessment Patient has had a recent decline in their functional status and demonstrates the ability to make significant improvements in function in a reasonable and predictable amount of time.  Equipment Recommendations  Rolling walker (2 wheels);Wheelchair (measurements PT);Wheelchair cushion (measurements PT)    Recommendations for Other Services       Precautions / Restrictions Precautions Precautions: Fall      Mobility  Bed Mobility Overal bed mobility: Needs Assistance Bed Mobility: Supine to Sit;Sit to Supine     Supine to sit: Mod assist;HOB elevated Sit to supine: Min assist   General bed mobility comments: Assist to bring legs off of stretcher and elevate trunk into sitting.  Assist to lower trunk and bring legs back up into bed.    Transfers                   General transfer comment: Unable to attempt due to height of stretcher and pt's questionable ability to bear enough weight on legs to stand.    Ambulation/Gait                  Stairs            Wheelchair Mobility    Modified Rankin (Stroke Patients Only)       Balance Overall balance assessment: Needs assistance Sitting-balance support: Single extremity supported;Bilateral upper extremity supported;Feet unsupported Sitting balance-Leahy Scale: Poor Sitting balance - Comments: Pt sat edge of stretcher with UE support and min guard to min assist Postural control: Right lateral lean                                   Pertinent Vitals/Pain Pain Assessment: Faces Faces Pain Scale: No hurt    Home Living Family/patient expects to be discharged to:: Private residence Living Arrangements: Children Available Help at Discharge: Family;Available PRN/intermittently (son works during the day) Type of Home: House Home Access: Level entry       [place]: One Van: [place] - single point;Grab bars - tub/shower      Prior Function Prior Level of Function : Needs assist       Physical Assist : Mobility (physical) Mobility (physical): Bed mobility;Transfers;Gait   Mobility Comments: Prior to Thanksgiving pt was ambulating using cane. Has declined and  now son reports recently she has become bed bound.       Hand Dominance   Dominant Hand: Right    Extremity/Trunk Assessment   Upper Extremity Assessment Upper Extremity Assessment: Generalized weakness    Lower Extremity Assessment Lower Extremity Assessment: Generalized weakness       Communication   Communication: HOH  Cognition Arousal/Alertness: Awake/alert Behavior During Therapy: WFL for tasks assessed/performed Overall Cognitive Status: Impaired/Different from  baseline Area of Impairment: Orientation;Attention;Memory;Following commands;Problem solving;Awareness;Safety/judgement                 Orientation Level: Disoriented to;Place;Time;Situation Current Attention Level: Sustained Memory: Decreased short-term memory Following Commands: Follows one step commands inconsistently;Follows one step commands with increased time Safety/Judgement: Decreased awareness of deficits;Decreased awareness of safety Awareness: Intellectual Problem Solving: Slow processing;Decreased initiation;Requires verbal cues;Requires tactile cues;Difficulty sequencing General Comments: Pt calling out for son despite son being in the room.        General Comments General comments (skin integrity, edema, etc.): VSS including BP from supine to sit    Exercises     Assessment/Plan    PT Assessment Patient needs continued PT services  PT Problem List Decreased strength;Decreased balance;Decreased mobility;Decreased activity tolerance;Decreased cognition       PT Treatment Interventions DME instruction;Functional mobility training;Balance training;Patient/family education;Gait training;Therapeutic activities;Therapeutic exercise    PT Goals (Current goals can be found in the Care Plan section)  Acute Rehab PT Goals Patient Stated Goal: Son hopes pt can become more mobile with treatment PT Goal Formulation: With family Time For Goal Achievement: 03/02/21 Potential to Achieve Goals: Fair    Frequency Min 3X/week   Barriers to discharge Decreased caregiver support son works during the day    Co-evaluation               AM-PAC PT "6 Clicks" Mobility  Outcome Measure Help needed turning from your back to your side while in a flat bed without using bedrails?: A Lot Help needed moving from lying on your back to sitting on the side of a flat bed without using bedrails?: A Lot Help needed moving to and from a bed to a chair (including a wheelchair)?:  Total Help needed standing up from a chair using your arms (e.g., wheelchair or bedside chair)?: Total Help needed to walk in hospital room?: Total Help needed climbing 3-5 steps with a railing? : Total 6 Click Score: 8    End of Session   Activity Tolerance: Patient tolerated treatment well Patient left: in bed;with call bell/phone within reach;with family/visitor present Nurse Communication: Mobility status PT Visit Diagnosis: Other abnormalities of gait and mobility (R26.89);Muscle weakness (generalized) (M62.81);Adult, failure to thrive (R62.7)    Time: 4827-0786 PT Time Calculation (min) (ACUTE ONLY): 17 min   Charges:   PT Evaluation $PT Eval Moderate Complexity: [place] Pager [phone] Office [place] 02/16/2021, 2:44 PM

## 2021-02-16 NOTE — Assessment & Plan Note (Addendum)
Rate controlled without medication Continue Eliquis - but if she is a fall risk this may need to be reconsidered

## 2021-02-16 NOTE — Assessment & Plan Note (Addendum)
-  Baseline poor control, last A1c was >14 -Glucose significantly elevated on presentation without acidosis -Resume Lantus -Will cover with moderate-scale SSI, also add Premeal coverage. -Will consult diabetes coordinator

## 2021-02-16 NOTE — ED Notes (Signed)
Patient in bed eating breakfast with no complaints at this time.

## 2021-02-16 NOTE — H&P (Signed)
History and Physical    Patient: Jaclyn [name] WGN:[id number] DOB: January 02, 1936 DOA: 02/15/2021 DOS: the patient was seen and examined on 02/16/2021 PCP: Jinny Sanders, MD  Patient coming from: Home - lives with son; NOK: Danya, [place], [phone]  Chief Complaint: FTT  HPI: Jaclyn [name] is a 85 y.o. female with medical history significant of afib; fibromyalgia/dysthymia; interstitial cystitis; HTN; HLD; uncontrolled DM; and hypothyroidism presenting with FTT.   She was somewhat slow to respond but eventually answered questions.  She reports problems with "dementia."  She had urinary incontinence and frequency, which is unusual for her.  She lives with her son but says that she isn't sure she can care for herself anymore.  I spoke with her son.  He thinks she has had some dementia but it got significantly worse in the last month.  When she gets UTI, she gets confused.  For over a month, she also has had urinating "out of control."  Her blood sugar has been high and she is thirsty and it is like it just runs right through her.  She has been too weak to stand up and has to wear diapers.  He does not think she is safe to stay at home right now.  He works during the day and thinks she is unable to care for herself at home.    ED Course:  FTT, weakness for approaching a month.  Head CT ok.  Labs with hyperglycemia without gap, abnormal UA.  Treating for UTI, urine culture pending. Likely to need SNF.   Review of Systems: ROS reviewed and negative except as above - limited by dementia  Past Medical History:  Diagnosis Date   Atrial fibrillation ([place])    Benign neoplasm of colon    Carotid artery occlusion    60-79% right ICA stenosis   Difficult intubation 02/17/2006   During surgery to remove large polyp   Diverticulosis of colon (without mention of hemorrhage)    Dysthymic disorder    Fibromyalgia    Headache(784.0)    Insomnia, unspecified    Interstitial cystitis     Osteoarthrosis, unspecified whether generalized or localized, unspecified site    Other and unspecified hyperlipidemia    Other chest pain    Other specified benign mammary dysplasias    TIA (transient ischemic attack)    Type II or unspecified type diabetes mellitus without mention of complication, not stated as uncontrolled    Unspecified essential hypertension    Unspecified hypothyroidism    Unspecified vitamin D deficiency    Urinary tract infection, site not specified    Past Surgical History:  Procedure Laterality Date   ABDOMINAL HYSTERECTOMY  1988   cervical dysplasia   CARDIAC CATHETERIZATION  02/19/06   EF 60%   COLONOSCOPY     ENDARTERECTOMY Right 08/26/2019   Procedure: RIGHT CAROTID ENDARTERECTOMY;  Surgeon: Rosetta Posner, MD;  Location: [facility] OR;  Service: Vascular;  Laterality: Right;   PATCH ANGIOPLASTY Right 08/26/2019   Procedure: PATCH ANGIOPLASTY OF RIGHT COMMON CAROTID ARTERY USING HEMASHIELD PLATINUM FINESSE PATCH;  Surgeon: Rosetta Posner, MD;  Location: [facility] OR;  Service: Vascular;  Laterality: Right;   RIGHT COLECTOMY  2005   for villous adenoma of the cecum Dr.streck   VESICOVAGINAL FISTULA CLOSURE W/ TAH  1990   w/ cystocele &retocele repairs Dr. Ree Edman   Social History:  reports that she has never smoked. She has never used smokeless tobacco. She reports that she does not drink alcohol  and does not use drugs.  Allergies  Allergen Reactions   Naproxen Sodium Other (See Comments)    Fever/aches and pains   Statins Other (See Comments)    myalgias   Sulfonamide Derivatives Hives and Itching   Latex Itching and Rash   Shellfish Allergy Itching, Swelling and Rash    Seafood, shrimp    Family History  Problem Relation Age of Onset   Lymphoma Father    Hypertension Father    Stroke Father    Hyperlipidemia Father    Hypertension Mother    Allergies Brother    Hypertension Sister        3   Breast cancer Sister    Fibromyalgia Sister        3    Hyperlipidemia Sister        3    Prior to Admission medications   Medication Sig Start Date End Date Taking? Authorizing Provider  apixaban (ELIQUIS) 5 MG TABS tablet TAKE 1 TABLET BY MOUTH TWICE A DAY 12/11/20   Bedsole, Amy E, MD  B-D ULTRAFINE III SHORT PEN 31G X 8 MM MISC USE TO INJECT INSULIN ONCE DAILY. DX: E11.65 07/02/20   Bedsole, Amy E, MD  Cholecalciferol (VITAMIN D3) 1.25 MG (50000 UT) CAPS Take 1 capsule by mouth once a week. 12/27/20   Bedsole, Amy E, MD  DULoxetine (CYMBALTA) 30 MG capsule TAKE 2 CAPSULES BY MOUTH EVERY DAY 12/27/19   Bedsole, Amy E, MD  insulin glargine (LANTUS SOLOSTAR) 100 UNIT/ML Solostar Pen Inject 70 Units into the skin daily. 40 units in the morning and 30 units in the evening 12/09/20   Bedsole, Amy E, MD  JANUVIA 100 MG tablet TAKE 1 TABLET BY MOUTH EVERY DAY 01/07/21   Bedsole, Amy E, MD  levothyroxine (SYNTHROID) 125 MCG tablet Take 1 tablet (125 mcg total) by mouth daily. 12/27/20   Bedsole, Amy E, MD  pantoprazole (PROTONIX) 40 MG tablet Take 1 tablet (40 mg total) by mouth daily. 09/10/19   Angiulli, Lavon Paganini, PA-C  trimethoprim (TRIMPEX) 100 MG tablet TAKE 1 TABLET BY MOUTH EVERY DAY 12/12/20   Bedsole, Amy E, MD  vitamin B-12 (CYANOCOBALAMIN) 1000 MCG tablet Take 1 tablet (1,000 mcg total) by mouth daily. 09/09/19   Cathlyn Parsons, PA-C    Physical Exam: Vitals:   02/15/21 2300 02/16/21 0005 02/16/21 0406 02/16/21 0725  BP: 103/74 (!) 146/73 115/81 (!) 115/48  Pulse: 86 79 87 92  Resp: 16 14 15 16   Temp:      TempSrc:      SpO2: 97% 99% 98% 98%  Weight:      Height:       General:  Appears confused, somewhat disheveled.  Initially did not answer questions but became more conversant once she woke up more Eyes:  PERRL, EOMI, normal lids, iris ENT:  grossly normal hearing, lips & tongue, mmm; poor dentition Neck:  no LAD, masses or thyromegaly Cardiovascular:  RRR, no m/r/g. No LE edema.  Respiratory:   CTA bilaterally with no  wheezes/rales/rhonchi.  Normal respiratory effort. Abdomen:  soft, NT, ND Skin:  no rash or induration seen on limited exam Musculoskeletal:  grossly normal tone BUE/BLE, good ROM, no bony abnormality Lower extremity:  No LE edema.  Limited foot exam with no ulcerations.  2+ distal pulses. Psychiatric:  eccentric mood and affect, speech mostly appropriate, AOx2 Neurologic:  CN 2-12 grossly intact, moves all extremities in coordinated fashion   Radiological Exams on Admission:  Independently reviewed - see discussion in A/P where applicable  CT Head Wo Contrast  Result Date: 02/15/2021 CLINICAL DATA:  Mental status change of unknown cause EXAM: CT HEAD WITHOUT CONTRAST TECHNIQUE: Contiguous axial images were obtained from the base of the skull through the vertex without intravenous contrast. COMPARISON:  MRI 08/29/2019.  CT 08/29/2019 FINDINGS: Brain: Diffuse cerebral atrophy. Ventricular dilatation consistent with central atrophy. Low-attenuation changes in the deep white matter consistent with small vessel ischemia. Basal ganglia calcifications. No abnormal extra-axial fluid collections. No mass effect or midline shift. Gray-white matter junctions are distinct. Basal cisterns are not effaced. No acute intracranial hemorrhage. Similar appearance to previous study. Vascular: Moderate intracranial arterial vascular calcifications. Skull: Calvarium appears intact. Sinuses/Orbits: Paranasal sinuses and mastoid air cells are clear. Other: None. IMPRESSION: No acute intracranial abnormalities. Chronic atrophy and small vessel ischemic changes. Electronically Signed   By: Lucienne Capers M.D.   On: 02/15/2021 19:41    EKG: not done   Labs on Admission: I have personally reviewed the available labs and imaging studies at the time of the admission.  Pertinent labs:    VBG: 7.368/42.3/24.3 Na++ 130 Glucose 356 Albumin 3.2 Unremarkable CBC COVID/flu negative UA: glucose >500, large Hgb, >80  ketones, moderate LE, 30 protein, many bacteria    Assessment/Plan * Failure to thrive in adult- (present on admission) -Patient presenting with confusion, poor hygiene -Her son reports that this often happens to her in the setting of UTI -At the time of my evaluation, she appeared to havemoderate dementia - she was conversational but not fully oriented and somewhat fidgety -While there may be an organic and reversible process in play (such as UTI, see below), it is also quite possible that this is a progressive cognitive decline associated with dementia -Will observe with PT/OT/ST/nutrition evaluations -Her family is providing 24/7 caregiver assistance and yet she is requiring more care over time; she may benefit from SNF placement -Will request palliative care evaluation for goals of care  UTI (urinary tract infection)- (present on admission) -Patient has been taking suppressive therapy with trimethoprim but appears to have failed this -Son reports urinary frequency and incontinence as well as reported confusion -UA is c/w UTI -She was given Rocephin in the ER, will continue -Will add on urine culture  Dyslipidemia- (present on admission) -Most recent LDL was 103 -She does not appear to be taking medications for this issue at this time  PAROXYSMAL ATRIAL FIBRILLATION- (present on admission) -Rate controlled without medication -Continue Eliquis - but if she is a fall risk this may need to be reconsidered  Poorly controlled type 2 diabetes mellitus with complication ([place])- (present on admission) -Baseline poor control, last A1c was >14 -Glucose significantly elevated on presentation without acidosis -Resume Lantus -Will cover with moderate-scale SSI -Will consult diabetes coordinator  Hypothyroidism- (present on admission) -TSH and free T4 were quite abnormal 2 months ago, will recheck -Continue Synthroid at current dose for now    Advance Care Planning:   Code Status: Full  Code   Consults: Palliative care; PT/OT/ST/Nutrition/TOC team  Family Communication: I spoke with her son by telephone  Severity of Illness: The appropriate patient status for this patient is INPATIENT. Inpatient status is judged to be reasonable and necessary in order to provide the required intensity of service to ensure the patient's safety. The patient's presenting symptoms, physical exam findings, and initial radiographic and laboratory data in the context of their chronic comorbidities is felt to place them at high risk for  further clinical deterioration. Furthermore, it is not anticipated that the patient will be medically stable for discharge from the hospital within 2 midnights of admission.   * I certify that at the point of admission it is my clinical judgment that the patient will require inpatient hospital care spanning beyond 2 midnights from the point of admission due to high intensity of service, high risk for further deterioration and high frequency of surveillance required.*  Author: Karmen Bongo 02/16/2021 5:24 PM  For on call review [email or web address].

## 2021-02-16 NOTE — Assessment & Plan Note (Addendum)
TSH elevated.  Free T4 almost undetectable. Son reports that the patient is compliant with all medication. Will provide IV Synthroid for 3 days and monitor response.

## 2021-02-16 NOTE — ED Notes (Signed)
EDP notified of pt's urine description and this RN's assess,ent of vaginal scratching and white/yellow patched around the vaginal area

## 2021-02-16 NOTE — ED Notes (Signed)
PT at bedside.

## 2021-02-16 NOTE — Assessment & Plan Note (Signed)
-  Most recent LDL was 103 -She does not appear to be taking medications for this issue at this time

## 2021-02-16 NOTE — ED Notes (Signed)
Pt refusing to try to pee, MD notified and I&O cath ordered

## 2021-02-16 NOTE — ED Provider Notes (Addendum)
Patient with generalized weakness, confusion. On labs, has uti w many bacteria, many wbc - will culture and rx. Pt also with large urine ketones, hyperglycemia, c/w dehydration. Ivf bolus.  Hospitalists consulted for admission.        Lajean Saver, MD 02/16/21 [phone]

## 2021-02-16 NOTE — ED Notes (Signed)
Tried to wake patient up for breakfast and patient pulled covers back over head

## 2021-02-16 NOTE — ED Notes (Signed)
Lunch tray is in front of pt.  Son is at bedside

## 2021-02-16 NOTE — Assessment & Plan Note (Addendum)
On chronic suppressive therapy with trimethoprim. Presents with complaints of frequency and incontinence as well as confusion. Pyuria on UA. Currently on IV Rocephin. Monitor cultures. Mentation not back to baseline.

## 2021-02-16 NOTE — Assessment & Plan Note (Addendum)
Ongoing for last few months. Ongoing confusion, poor hygiene, urinary incontinence as well as ataxia. Has memory issues at her baseline which also appears to be progressively worsening. Poor p.o. intake with weight loss reported by the son as well. PT OT consulted. Dietary consulted. Suspect the failure to thrive may have some association with her hypothyroidism, vitamin deficiency as well as possibility of an NPH.  Will monitor.

## 2021-02-16 NOTE — ED Notes (Signed)
Warm blankets and socks provided.

## 2021-02-16 NOTE — Progress Notes (Signed)
°   02/16/21 0820  TOC Assessment  Expected Discharge [place] (vs home with home health)  Barriers to Discharge Continued Medical Work up  Patient states their goals for this hospitalization and ongoing recovery are: To get her strength back  Living arrangements for the past 2 months Single Family Home  Lives with: Self  In-house Referral [facility];Clinical Social Work  Discharge Planning Services CM Consult  Patient language and need for interpreter reviewed: Yes  Criminal Activity/Legal Involvement Pertinent to Current Situation/Hospitalization No - Comment as needed  Need for Family Participation in Patient [place] giver support system in place? N  Appearance: Appears stated age  Attitude/Demeanor/Rapport Complaining (annoyed)  Affect (typically observed) Agitated  Orientation:  Oriented to Self;Oriented to Place;Oriented to  Time;Oriented to Situation  Alcohol / Substance Use Never Used  Psych Involvement N   RNCM consulted regarding safe discharge planning (Home with [facility] Placement).  Physical Therapy evaluation placed; will follow up after recommendations from PT.

## 2021-02-17 DIAGNOSIS — R270 Ataxia, unspecified: Secondary | ICD-10-CM

## 2021-02-17 DIAGNOSIS — R32 Unspecified urinary incontinence: Secondary | ICD-10-CM | POA: Diagnosis present

## 2021-02-17 DIAGNOSIS — R627 Adult failure to thrive: Secondary | ICD-10-CM | POA: Diagnosis not present

## 2021-02-17 LAB — CBC
HCT: 31.4 % — ABNORMAL LOW (ref 36.0–46.0)
Hemoglobin: 11.1 g/dL — ABNORMAL LOW (ref 12.0–15.0)
MCH: 32.8 pg (ref 26.0–34.0)
MCHC: 35.4 g/dL (ref 30.0–36.0)
MCV: 92.9 fL (ref 80.0–100.0)
Platelets: 220 10*3/uL (ref 150–400)
RBC: 3.38 MIL/uL — ABNORMAL LOW (ref 3.87–5.11)
RDW: 14.2 % (ref 11.5–15.5)
WBC: 6.3 10*3/uL (ref 4.0–10.5)
nRBC: 0 % (ref 0.0–0.2)

## 2021-02-17 LAB — BASIC METABOLIC PANEL
Anion gap: 10 (ref 5–15)
BUN: 10 mg/dL (ref 8–23)
CO2: 23 mmol/L (ref 22–32)
Calcium: 8.5 mg/dL — ABNORMAL LOW (ref 8.9–10.3)
Chloride: 101 mmol/L (ref 98–111)
Creatinine, Ser: 0.62 mg/dL (ref 0.44–1.00)
GFR, Estimated: >60 mL/min (ref >60)
Glucose, Bld: 184 mg/dL — ABNORMAL HIGH (ref 70–99)
Potassium: 3.5 mmol/L (ref 3.5–5.1)
Sodium: 134 mmol/L — ABNORMAL LOW (ref 135–145)

## 2021-02-17 LAB — GLUCOSE, CAPILLARY
Glucose-Capillary: 133 mg/dL — ABNORMAL HIGH (ref 70–99)
Glucose-Capillary: 102 mg/dL — ABNORMAL HIGH (ref 70–99)
Glucose-Capillary: 77 mg/dL (ref 70–99)

## 2021-02-17 LAB — CBG MONITORING, ED
Glucose-Capillary: 275 mg/dL — ABNORMAL HIGH (ref 70–99)
Glucose-Capillary: 210 mg/dL — ABNORMAL HIGH (ref 70–99)

## 2021-02-17 MED ORDER — CYANOCOBALAMIN 1000 MCG/ML IJ SOLN: 1000.0000 ug | Freq: Once | INTRAMUSCULAR | Status: DC

## 2021-02-17 MED ORDER — INSULIN ASPART 100 UNIT/ML IJ SOLN
6.0000 [IU] | Freq: Three times a day (TID) | INTRAMUSCULAR | Status: DC
  Administered 2021-02-17 – 2021-02-18 (×4): 6 [IU] via SUBCUTANEOUS

## 2021-02-17 MED ORDER — GLUCERNA SHAKE PO LIQD
237.0000 mL | Freq: Three times a day (TID) | ORAL | Status: DC
  Administered 2021-02-17 – 2021-02-20 (×9): 237 mL via ORAL
  Filled 2021-02-17: qty 237

## 2021-02-17 MED ORDER — FOLIC ACID 1 MG PO TABS
1.0000 mg | ORAL_TABLET | Freq: Every day | ORAL | Status: DC
  Administered 2021-02-17 – 2021-02-25 (×7): 1 mg via ORAL
  Filled 2021-02-17 (×8): qty 1

## 2021-02-17 MED ORDER — LEVOTHYROXINE SODIUM 25 MCG PO TABS: 137.0000 ug | ORAL_TABLET | Freq: Every day | ORAL | Status: DC

## 2021-02-17 MED ORDER — INSULIN GLARGINE-YFGN 100 UNIT/ML ~~LOC~~ SOLN
35.0000 [IU] | Freq: Two times a day (BID) | SUBCUTANEOUS | Status: DC
  Administered 2021-02-17 – 2021-02-18 (×2): 35 [IU] via SUBCUTANEOUS
  Filled 2021-02-17 (×3): qty 0.35

## 2021-02-17 MED ORDER — INSULIN GLARGINE-YFGN 100 UNIT/ML ~~LOC~~ SOLN
40.0000 [IU] | Freq: Two times a day (BID) | SUBCUTANEOUS | Status: DC
  Administered 2021-02-17: 40 [IU] via SUBCUTANEOUS
  Filled 2021-02-17 (×2): qty 0.4

## 2021-02-17 MED ORDER — POLYETHYLENE GLYCOL 3350 17 G PO PACK
17.0000 g | PACK | Freq: Every day | ORAL | Status: DC
  Administered 2021-02-18 – 2021-02-25 (×6): 17 g via ORAL
  Filled 2021-02-17 (×8): qty 1

## 2021-02-17 MED ORDER — LEVOTHYROXINE SODIUM 25 MCG PO TABS
25.0000 ug | ORAL_TABLET | Freq: Once | ORAL | Status: AC
  Administered 2021-02-17: 25 ug via ORAL
  Filled 2021-02-17: qty 1

## 2021-02-17 MED ORDER — THIAMINE HCL 100 MG/ML IJ SOLN
100.0000 mg | Freq: Every day | INTRAMUSCULAR | Status: DC
  Administered 2021-02-17 – 2021-02-25 (×9): 100 mg via INTRAVENOUS
  Filled 2021-02-17 (×9): qty 2

## 2021-02-17 MED ORDER — LEVOTHYROXINE SODIUM 100 MCG/5ML IV SOLN
62.5000 ug | Freq: Every day | INTRAVENOUS | Status: DC
  Administered 2021-02-18 – 2021-02-25 (×8): 62.5 ug via INTRAVENOUS
  Filled 2021-02-17 (×8): qty 5

## 2021-02-17 MED ORDER — VITAMIN D (ERGOCALCIFEROL) 1.25 MG (50000 UNIT) PO CAPS
50000.0000 [IU] | ORAL_CAPSULE | ORAL | Status: DC
  Administered 2021-02-17 – 2021-02-24 (×2): 50000 [IU] via ORAL
  Filled 2021-02-17 (×2): qty 1

## 2021-02-17 MED ORDER — MIRABEGRON ER 25 MG PO TB24
25.0000 mg | ORAL_TABLET | Freq: Every day | ORAL | Status: DC
  Administered 2021-02-18 – 2021-02-25 (×7): 25 mg via ORAL
  Filled 2021-02-17 (×8): qty 1

## 2021-02-17 NOTE — Progress Notes (Signed)
SLP Cancellation Note  Patient Details Name: Jaclyn [name] MRN: [id number] DOB: Mar 18, 1935   Cancelled treatment:       Reason Eval/Treat Not Completed: Patient's level of consciousness;Other (comment) (SLP will attempt next 1-2 dates schedule permitting)   Sonia Baller, MA, CCC-SLP Speech Therapy

## 2021-02-17 NOTE — Evaluation (Signed)
Occupational Therapy Evaluation Patient Details Name: Jaclyn [name] MRN: [id number] DOB: 1935/05/09 Today's Date: 02/17/2021   History of Present Illness Pt presented to ED on 12/30 with failure to thrive. PMH includes COVID, CVA, dementia, HTN, hypothyroidism, recurrent UTI, arthritis.   Clinical Impression   Per chart, Stephene required increase assisting since prior to Thanksgiving and has most recently been near bedbound. Pt was lethargic and not answering questions appropriately at the time of evaluation, she required increased time and cues to follow all simple commands. Overall pt transferred to sitting EOB with max A despite multimodal cues for sequencing. She currently requires up to max A for ADLs at bed level. OOB deferred for safety. Pt will benefit from continued OT acutely. Recommend SNF at d/c due to high assist levels, safety and confusion.      Recommendations for follow up therapy are one component of a multi-disciplinary discharge planning process, led by the attending physician.  Recommendations may be updated based on patient status, additional functional criteria and insurance authorization.   Follow Up Recommendations  Skilled nursing-short term rehab (<3 hours/day)    Assistance Recommended at Discharge Frequent or constant Supervision/Assistance  Functional Status Assessment  Patient has had a recent decline in their functional status and/or demonstrates limited ability to make significant improvements in function in a reasonable and predictable amount of time  Equipment Recommendations  None recommended by OT       Precautions / Restrictions Precautions Precautions: Fall Restrictions Weight Bearing Restrictions: No      Mobility Bed Mobility Overal bed mobility: Needs Assistance Bed Mobility: Rolling;Sidelying to Sit;Sit to Supine Rolling: Max assist Sidelying to sit: Max assist   Sit to supine: Max assist   General bed mobility comments: pt minimally  assisting    Transfers                   General transfer comment: defer for safety      Balance Overall balance assessment: Needs assistance Sitting-balance support: Single extremity supported;Feet supported Sitting balance-Leahy Scale: Poor Sitting balance - Comments: reliant on therapist support, leaning posteriorally in attempt to get back into bed                                   ADL either performed or assessed with clinical judgement   ADL Overall ADL's : Needs assistance/impaired Eating/Feeding: Minimal assistance;Bed level   Grooming: Moderate assistance;Bed level   Upper Body Bathing: Maximal assistance;Sitting   Lower Body Bathing: Maximal assistance;+2 for physical assistance;+2 for safety/equipment;Bed level   Upper Body Dressing : Maximal assistance;Sitting   Lower Body Dressing: Maximal assistance;+2 for physical assistance;+2 for safety/equipment;Bed level   Toilet Transfer: Maximal assistance;+2 for physical assistance;+2 for safety/equipment   Toileting- Clothing Manipulation and Hygiene: Maximal assistance;+2 for physical assistance;+2 for safety/equipment       Functional mobility during ADLs: Maximal assistance;+2 for safety/equipment;+2 for physical assistance General ADL Comments: session limited to bed leve lfor safety. incrased assist due to poor cognition and lethargy     Vision   Vision Assessment?: No apparent visual deficits     Perception     Praxis      Pertinent Vitals/Pain Pain Assessment: Faces Faces Pain Scale: No hurt Pain Intervention(s): Monitored during session     Hand Dominance Right   Extremity/Trunk Assessment Upper Extremity Assessment Upper Extremity Assessment: Generalized weakness;Difficult to assess due to impaired cognition (pt actively resisting  ROM at bed level, she did reach over her head wtih the RUE 1x)   Lower Extremity Assessment Lower Extremity Assessment: Defer to PT  evaluation   Cervical / Trunk Assessment Cervical / Trunk Assessment: Kyphotic   Communication Communication Communication: HOH;Expressive difficulties   Cognition Arousal/Alertness: Lethargic Behavior During Therapy: Flat affect;Anxious Overall Cognitive Status: Impaired/Different from baseline Area of Impairment: Attention;Orientation;Following commands;Safety/judgement;Awareness                 Orientation Level: Disoriented to;Place;Time;Situation Current Attention Level: Focused Memory: Decreased short-term memory;Decreased recall of precautions Following Commands: Follows one step commands inconsistently Safety/Judgement: Decreased awareness of deficits;Decreased awareness of safety Awareness: Intellectual Problem Solving: Requires tactile cues;Requires verbal cues General Comments: pt lethargic and requires max cues to follow simple command. Pt anxious wtih all movement and resisting. unable to state any orientation questions besides her first name. Pt yelling "what" and "why"     General Comments  VSS on RA    Exercises     Shoulder Instructions      Home Living Family/patient expects to be discharged to:: Private residence Living Arrangements: Children Available Help at Discharge: Family;Available PRN/intermittently Type of Home: House Home Access: Level entry     Home Layout: One level     Bathroom Shower/Tub: Tub/shower unit;Curtain   Biochemist, clinical: Standard Bathroom Accessibility: Yes   Home Equipment: Cane - single point;Grab bars - tub/shower          Prior Functioning/Environment Prior Level of Function : Needs assist       Physical Assist : Mobility (physical) Mobility (physical): Bed mobility;Transfers;Gait   Mobility Comments: Prior to Thanksgiving pt was ambulating using cane. Has declined and now son reports recently she has become bed bound. ADLs Comments: no family present to give detail - pt recently bed bound, likely  dependent care for ADLs        OT Problem List: Decreased strength;Decreased range of motion;Decreased activity tolerance;Impaired balance (sitting and/or standing);Decreased knowledge of precautions;Decreased knowledge of use of DME or AE;Decreased safety awareness;Decreased cognition      OT Treatment/Interventions: Self-care/ADL training;Therapeutic exercise;Therapeutic activities;DME and/or AE instruction;Patient/family education;Balance training    OT Goals(Current goals can be found in the care plan section) Acute Rehab OT Goals Patient Stated Goal: "lay down" OT Goal Formulation: With patient Time For Goal Achievement: 03/03/21 Potential to Achieve Goals: Good ADL Goals Pt Will Perform Grooming: with set-up;sitting Pt Will Perform Upper Body Bathing: with set-up;sitting Pt Will Perform Lower Body Dressing: with min guard assist;sit to/from stand Pt Will Transfer to Toilet: with min guard assist;stand pivot transfer  OT Frequency: Min 2X/week   Barriers to D/C:            Co-evaluation              AM-PAC OT "6 Clicks" Daily Activity     Outcome Measure Help from another person eating meals?: A Little Help from another person taking care of personal grooming?: A Little Help from another person toileting, which includes using toliet, bedpan, or urinal?: A Lot Help from another person bathing (including washing, rinsing, drying)?: A Lot Help from another person to put on and taking off regular upper body clothing?: A Lot Help from another person to put on and taking off regular lower body clothing?: A Lot 6 Click Score: 14   End of Session Nurse Communication: Mobility status  Activity Tolerance: Patient limited by lethargy Patient left: in bed;with call bell/phone within reach;with bed alarm set  OT Visit Diagnosis: Unsteadiness on feet (R26.81);Other abnormalities of gait and mobility (R26.89);Muscle weakness (generalized) (M62.81);Pain                Time:  3142-7670 OT Time Calculation (min): 15 min Charges:  OT General Charges $OT Visit: 1 Visit OT Evaluation $OT Eval Moderate Complexity: 1 Mod   Twania Bujak A Nasra Counce 02/17/2021, 4:05 PM

## 2021-02-17 NOTE — Assessment & Plan Note (Signed)
We will add Myrbetriq.

## 2021-02-17 NOTE — Assessment & Plan Note (Signed)
Son reports that patient initially started with some urinary incontinence. This was followed by edition of ataxia and also has progressive decline with memory issues and confusion. This places NPH in differential. CT scan of the head does show enlarged ventricles but does not show any typical evidence of sulcal effacement As the patient appears to have hypothyroidism nutritional deficiency with asterixis which can likely complicate diagnosis currently we will treat those condition aggressively and if no improvement in patient condition over 1 month recommend outpatient work-up for NPH.

## 2021-02-17 NOTE — ED Notes (Signed)
Report called to 5th floor nurse

## 2021-02-17 NOTE — ED Notes (Signed)
Pt noted to have BM and removed brief and smear stool onto self and bed. Pt given bath, all linens changed, pt reoriented to situation and warm blankets applied.

## 2021-02-17 NOTE — Progress Notes (Signed)
TRH night cross cover note:  I was notified by RN of patient's CBG 439, with existence of current orders for nightly sliding scale coverage up to 5 units.  At that time, 5 units of NovoLog were administered subcutaneously, and I also ordered a 500 cc normal saline bolus x1 followed by resumption of existing continuous NS at 75 cc/h.  Asked that CBG be rechecked in 2 hours and that RN please contact me if resultant CBG continues to demonstrate blood sugar greater than 400.  Update: Repeat CBG 2 hours after the above events revealed unchanged CBG of 439.  I subsequently ordered NovoLog 7 units subcu x1 dose now, and asked that CBG be rechecked in 2 hours, and that I  be contacted if resultant blood sugar remains greater than 400 at that time.    Babs Bertin, DO Hospitalist

## 2021-02-17 NOTE — Progress Notes (Signed)
°  Progress Note   Patient: Jaclyn [name] WGN:[id number] DOB: 19-Jul-1935 DOA: 02/15/2021     1 DOS: the patient was seen and examined on 02/17/2021   Brief hospital course: No notes on file  Assessment and Plan * Failure to thrive in adult- (present on admission) Ongoing for last few months. Ongoing confusion, poor hygiene, urinary incontinence as well as ataxia. Has memory issues at her baseline which also appears to be progressively worsening. Poor p.o. intake with weight loss reported by the son as well. PT OT consulted. Dietary consulted. Suspect the failure to thrive may have some association with her hypothyroidism, vitamin deficiency as well as possibility of an NPH.  Will monitor.  UTI (urinary tract infection)- (present on admission) On chronic suppressive therapy with trimethoprim. Presents with complaints of frequency and incontinence as well as confusion. Pyuria on UA. Currently on IV Rocephin. Monitor cultures. Mentation not back to baseline.  Hypothyroidism- (present on admission) TSH elevated.  Free T4 almost undetectable. Son reports that the patient is compliant with all medication. Will provide IV Synthroid for 3 days and monitor response.  Ataxia Son reports that patient initially started with some urinary incontinence. This was followed by edition of ataxia and also has progressive decline with memory issues and confusion. This places NPH in differential. CT scan of the head does show enlarged ventricles but does not show any typical evidence of sulcal effacement As the patient appears to have hypothyroidism nutritional deficiency with asterixis which can likely complicate diagnosis currently we will treat those condition aggressively and if no improvement in patient condition over 1 month recommend outpatient work-up for NPH.  Incontinence- (present on admission) We will add Myrbetriq.  PAROXYSMAL ATRIAL FIBRILLATION- (present on admission) Rate controlled  without medication Continue Eliquis - but if she is a fall risk this may need to be reconsidered  Poorly controlled type 2 diabetes mellitus with complication ([place])- (present on admission) -Baseline poor control, last A1c was >14 -Glucose significantly elevated on presentation without acidosis -Resume Lantus -Will cover with moderate-scale SSI, also add Premeal coverage. -Will consult diabetes coordinator  Dyslipidemia- (present on admission) -Most recent LDL was 103 -She does not appear to be taking medications for this issue at this time     Subjective: Patient denies any acute complaint.  No nausea no vomiting.  Somewhat confused.  Unable to provide history.  Objective Vital signs were reviewed and unremarkable. General: Appear in mild distress, no Rash; Oral Mucosa Clear, moist. no Abnormal Neck Mass Or lumps, Conjunctiva normal  Cardiovascular: S1 and S2 Present, no Murmur, Respiratory: good respiratory effort, Bilateral Air entry present and CTA, no Crackles, no wheezes Abdomen: Bowel Sound present, Soft and no tenderness Extremities: no Pedal edema Neurology: alert and oriented to place, and person affect appropriate. no new focal deficit Gait not checked due to patient safety concerns    Data Reviewed: Labs independently reviewed.  Severe hyperglycemia but rhabdo causing 400.  CBC and BMP stable.  Family Communication: Son at bedside.  Disposition: Status is: Inpatient  Remains inpatient appropriate because: Ongoing confusion requiring aggressive therapy and monitoring.         Time spent: 35 minutes  Author: Berle Mull 02/17/2021 7:33 PM  For on call review [email or web address].

## 2021-02-18 DIAGNOSIS — R531 Weakness: Secondary | ICD-10-CM | POA: Diagnosis not present

## 2021-02-18 DIAGNOSIS — Z7189 Other specified counseling: Secondary | ICD-10-CM

## 2021-02-18 DIAGNOSIS — Z515 Encounter for palliative care: Secondary | ICD-10-CM

## 2021-02-18 DIAGNOSIS — N390 Urinary tract infection, site not specified: Secondary | ICD-10-CM

## 2021-02-18 DIAGNOSIS — R627 Adult failure to thrive: Secondary | ICD-10-CM | POA: Diagnosis not present

## 2021-02-18 LAB — URINE CULTURE: Culture: >=100000 — AB

## 2021-02-18 LAB — GLUCOSE, CAPILLARY
Glucose-Capillary: 114 mg/dL — ABNORMAL HIGH (ref 70–99)
Glucose-Capillary: 133 mg/dL — ABNORMAL HIGH (ref 70–99)
Glucose-Capillary: 272 mg/dL — ABNORMAL HIGH (ref 70–99)
Glucose-Capillary: 326 mg/dL — ABNORMAL HIGH (ref 70–99)
Glucose-Capillary: 330 mg/dL — ABNORMAL HIGH (ref 70–99)

## 2021-02-18 MED ORDER — INSULIN GLARGINE-YFGN 100 UNIT/ML ~~LOC~~ SOLN
38.0000 [IU] | Freq: Two times a day (BID) | SUBCUTANEOUS | Status: DC
  Administered 2021-02-18 – 2021-02-20 (×4): 38 [IU] via SUBCUTANEOUS
  Filled 2021-02-18 (×6): qty 0.38

## 2021-02-18 MED ORDER — ADULT MULTIVITAMIN W/MINERALS CH
1.0000 | ORAL_TABLET | Freq: Every day | ORAL | Status: DC
  Administered 2021-02-19 – 2021-02-25 (×5): 1 via ORAL
  Filled 2021-02-18 (×6): qty 1

## 2021-02-18 MED ORDER — INSULIN ASPART 100 UNIT/ML IJ SOLN
8.0000 [IU] | Freq: Three times a day (TID) | INTRAMUSCULAR | Status: DC
  Administered 2021-02-19 (×3): 8 [IU] via SUBCUTANEOUS

## 2021-02-18 NOTE — TOC Progression Note (Signed)
Transition of Care [facility]) - Progression Note    Patient Details  Name: Jaclyn [name] MRN: [id number] Date of Birth: Feb 15, 1936  Transition of Care [facility]) CM/SW [place], [place] Phone Number: 02/18/2021, 9:54 AM  Clinical Narrative:    CSW received consult for possible SNF placement at time of discharge. CSW spoke with patient's son. He reported that he is patient's caregiver but that he felt it was a good idea to pursue rehab at SNF to help patient gain back mobility before returning home. [name] reported preference for a facility in [place]. CSW discussed insurance authorization process and will provide Medicare SNF ratings list. CSW will send out referrals.   Skilled Nursing Rehab Facilities-   [email or web address]   Ratings out of 5 possible   Name Address  Phone # [place] Inspection Overall  [facility] [address], [place] 5 5 2 4   [facility]  [address], [place] [phone] 4 2 5 5   [facility] [place], [place] 4 1 1 1   [place], [place] 2 2 4 4   [facility] [address] [place] 2 1 1 1   [place] 3 1 4 3   [facility] [address], [place] 5 2 2 3   [facility] [address] [place] 4 1 2 1   [facility] [place] 5 1 2 2   [facility] [phone], [place] [phone] 4 1 1 1   [facility] [address], [facility] [phone] 4 1 2 1   [place] [phone] 4 1 1 1        Expected Discharge Plan: [place] Barriers to Discharge: Continued Medical Work up, SNF Pending bed offer, Ship broker  Expected Discharge Plan  and Services Expected Discharge Plan: [place] In-house Referral: [facility], Clinical Social Work Discharge Planning Services: CM Consult Post Acute Care Choice: [place] Living arrangements for the past 2 months: Single Family Home                                       Social Determinants of Health (SDOH) Interventions    Readmission Risk Interventions No flowsheet data found.

## 2021-02-18 NOTE — Progress Notes (Signed)
PROGRESS NOTE    Jaclyn [name]  WFU:[id number]  DOB: November 06, 1935  DOA: 02/15/2021 PCP: Jinny Sanders, MD Outpatient Specialists:   Hospital course:  86 year old female with atrial fibrillation on Eliquis, fibromyalgia, HTN, poorly controlled DM was admitted 02/16/2021 with subacute worsening of functional status/FTT.  She had been having urinary incontinence, increased confusion much worse than baseline mild dementia, decreased p.o. intake and on ability to care for herself.  Work-up in the ED was notable for uncontrolled hypothyroidism, marked hyperglycemia and UTI.  Subjective:  Patient is hard of hearing, is having a hard time understanding me through the mask.  Patient's son is at bedside and she does answer him appropriately when he asks her if she can understand him.  She is quite hungry and eating well   Objective: Vitals:   02/18/21 0054 02/18/21 0810 02/18/21 1100 02/18/21 1552  BP: (!) 107/46 128/83 (!) 90/52 135/70  Pulse: 75 83 89 78  Resp: 17 16 17 16   Temp: 98 F (36.7 C) 97.9 F (36.6 C) 97.7 F (36.5 C) 98 F (36.7 C)  TempSrc: Axillary Oral Oral Oral  SpO2: 96% 98% 97% 95%  Weight:      Height:        Intake/Output Summary (Last 24 hours) at 02/18/2021 1714 Last data filed at 02/17/2021 2200 Gross per 24 hour  Intake 240 ml  Output --  Net 240 ml   Filed Weights   02/15/21 1732  Weight: 68.5 kg     Exam:  General: Chronically ill-appearing female partially lying partially sitting up in bed eating pancakes with attentive son at bedside Eyes: sclera anicteric, conjuctiva mild injection bilaterally CVS: S1-S2, regular  Respiratory:  decreased air entry bilaterally secondary to decreased inspiratory effort, rales at bases  GI: NABS, soft, NT  LE: No edema.  Psych: Patient clearly has a cognitive abnormality however speech is fluent, affect is flat and inattentive..   Assessment & Plan:   Functional decline/FTT Likely multifactorial from  UTI, uncontrolled hypothyroidism, decreased p.o. intake and dehydration secondary to marked hyperglycemia on top of baseline dementia. There were some consideration of NPH given some ataxia however she has many other reasons to have functional decline and we can consider diagnostic work-up of NPH if warranted after acute issues have been addressed.  Appreciate PT and OT consultation Appreciate palliative care consultation regarding goals for care [facility] consulted for SNF placement as son does not feel he can take care of patient at home anymore.  UTI Continue Rocephin started 02/16/2021 Has been on suppressive trimethoprim at home Myrbetriq was added for incontinence  Uncontrolled DM2 Very much appreciate diabetes coordinator recommendations Lantus and aspart were increased as per recommendations  Hypothyroidism TSH is 22, free T4 is 0.55 Patient has not been taking her Synthroid She was started on IV Synthroid x3 days yesterday Restart oral Synthroid once she has completed her IV course of treatment  Disposition Patient will need to be discharged to a SNF, TOC aware Goals for care also being actively discussed via palliative care consultation    DVT prophylaxis: Eliquis Code Status: Full Family Communication: Patient's son was at bedside throughout Disposition Plan:   Patient is from: Home  Anticipated Discharge Location: SNF  Barriers to Discharge: Subacute confusion  Is patient medically stable for Discharge: No   Scheduled Meds:  apixaban  5 mg Oral BID   docusate sodium  100 mg Oral BID   DULoxetine  60 mg Oral Daily   feeding supplement (  GLUCERNA SHAKE)  237 mL Oral TID BM   folic acid  1 mg Oral Daily   insulin aspart  0-15 Units Subcutaneous TID WC   insulin aspart  0-5 Units Subcutaneous QHS   [START ON 02/19/2021] insulin aspart  8 Units Subcutaneous TID WC   insulin glargine-yfgn  38 Units Subcutaneous BID   levothyroxine  62.5 mcg Intravenous Daily   mirabegron  ER  25 mg Oral Daily   multivitamin with minerals  1 tablet Oral Daily   polyethylene glycol  17 g Oral Daily   thiamine injection  100 mg Intravenous Daily   Vitamin D (Ergocalciferol)  50,000 Units Oral Q7 days   Continuous Infusions:  cefTRIAXone (ROCEPHIN)  IV 1 g (02/18/21 3606)   lactated ringers      Data Reviewed:  Basic Metabolic Panel: Recent Labs  Lab 02/15/21 2046 02/15/21 2056 02/17/21 0533  NA 130* 134* 134*  K 3.9 4.1 3.5  CL 95*  --  101  CO2 22  --  23  GLUCOSE 356*  --  184*  BUN 11  --  10  CREATININE 0.90  --  0.62  CALCIUM 8.8*  --  8.5*    CBC: Recent Labs  Lab 02/15/21 2046 02/15/21 2056 02/17/21 0533  WBC 6.4  --  6.3  NEUTROABS 4.5  --   --   HGB 12.6 11.9* 11.1*  HCT 35.2* 35.0* 31.4*  MCV 92.6  --  92.9  PLT 247  --  220    Studies: No results found.  Principal Problem:   Failure to thrive in adult Active Problems:   Hypothyroidism   Poorly controlled type 2 diabetes mellitus with complication ([place])   PAROXYSMAL ATRIAL FIBRILLATION   Dyslipidemia   UTI (urinary tract infection)   Incontinence   Ataxia     Jaclyn [name] Jaclyn [name], [facility]  If 7PM-7AM, please contact night-coverage [email or web address]   LOS: 2 days

## 2021-02-18 NOTE — Progress Notes (Signed)
Inpatient Diabetes Program Recommendations  AACE/ADA: New Consensus Statement on Inpatient Glycemic Control (2015)  Target Ranges:  Prepandial:   less than 140 mg/dL      Peak postprandial:   less than 180 mg/dL (1-2 hours)      Critically ill patients:  140 - 180 mg/dL   Lab Results  Component Value Date   GLUCAP 326 (H) 02/18/2021   HGBA1C 14.4 (H) 12/25/2020    Review of Glycemic Control  Latest Reference Range & Units 02/18/21 08:13 02/18/21 11:28  Glucose-Capillary 70 - 99 mg/dL 272 (H) 326 (H)  (H): Data is abnormally high  Diabetes history: DM Outpatient Diabetes medications: Lantus 35 units BID, Januvia 100 mg QD  Current orders for Inpatient glycemic control: Semglee 35 units BID Novolog 6 units TID with meals, Novlog 0-15 units tID and 0-5 units QHS  Inpatient Diabetes Program Recommendations:    Semglee 38 units BID Novolog 8 units TID with measl IF eats at least 50%  Will continue to follow while inpatient.  Thank you, Reche Dixon, MSN, RN Diabetes Coordinator Inpatient Diabetes Program [phone] (team pager from 8a-5p)

## 2021-02-18 NOTE — Progress Notes (Signed)
Initial Nutrition Assessment  DOCUMENTATION CODES:   Not applicable  INTERVENTION:   Encourage good PO intake  Multivitamin w/ minerals daily Continue Glucerna Shake po TID, each supplement provides 220 kcal and 10 grams of protein Feeding assistance.   NUTRITION DIAGNOSIS:   Inadequate oral intake related to lethargy/confusion as evidenced by meal completion < 25% (RN reports pt eating 0%).  GOAL:   Patient will meet greater than or equal to 90% of their needs  MONITOR:   PO intake, Supplement acceptance, Labs  REASON FOR ASSESSMENT:   Consult Other (Comment) (Nutritional Goals)  ASSESSMENT:   86 y.o. female presented to the ED with failure to thrive. PMH includes HTN, uncontrolled T2DM, TIA, and diverticulitis. Pt admitted with failure to thrive and UTI.   Pt laying in bed, unable to follow commands or answer any questions from RD.   Pt would sit up and then lay back down and stare out the window.   RD observed lunch tray untouched in pt room. No intakes recorded within EMR.   Spoke with RN about pt; RN states that pt can eat well but is too lethargic.   Per EMR, pt has not had any weight loss within the past year.   Medications reviewed and include: Colace, Folic Acid, SSI 6-20 units TID + 0-5 units daily + 6 units TID, Semglee 35 units, Miralax, Thiamine, Vitamin D, IV antibiotics Labs reviewed: 24 hr CBG 77-326, Hgb A1c 14.4% (11/22)   NUTRITION - FOCUSED PHYSICAL EXAM:  RD unable to perform, pt unable to follow commands.  Diet Order:   Diet Order             Diet Carb Modified Fluid consistency: Thin; Room service appropriate? No  Diet effective now                   EDUCATION NEEDS:   Not appropriate for education at this time  Skin:  Skin Assessment: Reviewed RN Assessment  Last BM:  02/17/2021  Height:   Ht Readings from Last 1 Encounters:  02/15/21 5\' 2"  (1.575 m)    Weight:   Wt Readings from Last 1 Encounters:  02/15/21 68.5  kg    Ideal Body Weight:  50 kg  BMI:  Body mass index is 27.62 kg/m.  Estimated Nutritional Needs:   Kcal:  1700-1900  Protein:  85-100 grams  Fluid:  >/= 1.7 L    Maddy Graham Louie Casa, RD, LDN Clinical Dietitian See [facility] for contact information.

## 2021-02-18 NOTE — TOC Progression Note (Signed)
Transition of Care [facility]) - Progression Note    Patient Details  Name: Jaclyn [name] MRN: [id number] Date of Birth: 1936/01/24  Transition of Care [facility]) CM/SW [place], LCSW Phone Number: 02/18/2021, 4:37 PM  Clinical Narrative:    CSW provided SNF bed offers of [facility] and [facility] to patient's son. He will contact CSW in the morning with decision.    Expected Discharge Plan: Skilled Nursing Facility Barriers to Discharge: Continued Medical Work up, SNF Pending bed offer, Ship broker  Expected Discharge Plan and Services Expected Discharge Plan: [place] In-house Referral: [facility], Clinical Social Work Discharge Planning Services: CM Consult Post Acute Care Choice: [place] Living arrangements for the past 2 months: Single Family Home                                       Social Determinants of Health (SDOH) Interventions    Readmission Risk Interventions No flowsheet data found.

## 2021-02-18 NOTE — NC FL2 (Signed)
[place] LEVEL OF CARE SCREENING TOOL     IDENTIFICATION  Patient Name: Jaclyn [name] Birthdate: August 26, 1935 Sex: female Admission Date (Current Location): 02/15/2021  [facility] and [place] Number:  Herbalist and Address:  [facility] [place] [address] [place], [place] [address]      Provider Number: [id number]  Attending Physician Name and Address:  Oren Binet*  Relative Name and Phone Number:       Current Level of Care: Hospital Recommended Level of Care: [place] Prior Approval Number:    Date Approved/Denied:   PASRR Number: [id number]  Discharge Plan: SNF    Current Diagnoses: Patient Active Problem List   Diagnosis Date Noted   Incontinence 02/17/2021   Ataxia 02/17/2021   UTI (urinary tract infection) 02/16/2021   Failure to thrive in adult 02/16/2021   Mixed stress and urge urinary incontinence 02/05/2021   Yeast infection 12/03/2020   Hypokalemia    Labile blood glucose    Uncontrolled type 2 diabetes mellitus with hyperglycemia (HCC)    Orthostatic hypotension    Elevated blood pressure reading    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   Recurrent UTI    Dyslipidemia    Orthostasis    Dysarthria    Vagina, candidiasis 03/10/2019   Dementia ([place]) 03/10/2019   Acute respiratory failure with hypoxia ([place]) 63/14/9702   Acute metabolic encephalopathy 63/78/5885   COVID-19 virus infection 01/15/2019   Acute lower UTI 01/15/2019   B12 deficiency 01/22/2018   History of CVA (cerebrovascular accident) 06/20/2017   Stenosis of right carotid artery    Peripheral edema 07/29/2016   Imbalance 04/07/2016   Intertriginous candidiasis 04/26/2015   Hard of hearing 04/12/2015   TIA (transient ischemic attack) 04/10/2015   History of recurrent UTIs 04/10/2015   Dysuria 03/09/2015   Tremor 05/11/2014   Leg weakness, bilateral 05/11/2014   Fatty liver 08/04/2013    Personal history of colonic polyps 10/05/2012   Esophageal reflux 10/05/2012   Carotid stenosis, symptomatic, with infarction ([place]) 10/14/2010   MDD (major depressive disorder), single episode, moderate ([place]) 01/13/2008   Essential hypertension, benign 01/13/2008   PAROXYSMAL ATRIAL FIBRILLATION 01/13/2008   Intracranial vascular stenosis 01/13/2008   DIVERTICULOSIS OF COLON 01/13/2008   Fibromyalgia 01/13/2008   CHEST PAIN, ATYPICAL 01/13/2008   Hypothyroidism 04/13/2007   Poorly controlled type 2 diabetes mellitus with complication ([place]) 02/77/4128   Vitamin D deficiency 04/13/2007   Hyperlipidemia LDL goal <70 04/13/2007   DEGENERATIVE JOINT DISEASE 04/13/2007    Orientation RESPIRATION BLADDER Height & Weight     Self  Normal Incontinent, External catheter Weight: 151 lb 0.2 oz (68.5 kg) Height:  5\' 2"  (157.5 cm)  BEHAVIORAL SYMPTOMS/MOOD NEUROLOGICAL BOWEL NUTRITION STATUS      Continent Diet (see dc summary)  AMBULATORY STATUS COMMUNICATION OF NEEDS Skin   Extensive Assist Verbally Normal                       Personal Care Assistance Level of Assistance  Bathing, Feeding, Dressing Bathing Assistance: Maximum assistance Feeding assistance: Limited assistance Dressing Assistance: Limited assistance     Functional Limitations Info  Sight, Hearing Sight Info: Impaired Hearing Info: Impaired      SPECIAL CARE FACTORS FREQUENCY  PT (By licensed PT), OT (By licensed OT)     PT Frequency: 5x/week OT Frequency: 5x/week            Contractures Contractures  Info: Not present    Additional Factors Info  Code Status, Allergies, Psychotropic, Insulin Sliding Scale Code Status Info: Full Allergies Info: Naproxen Sodium, Statins, Sulfonamide Derivatives, Latex, Shellfish Allergy Psychotropic Info: Cymbalta Insulin Sliding Scale Info: see dc summary       Current Medications (02/18/2021):  This is the current hospital active medication list Current  Facility-Administered Medications  Medication Dose Route Frequency Provider Last Rate Last Admin   acetaminophen (TYLENOL) tablet 650 mg  650 mg Oral Q6H PRN Karmen Bongo, MD       Or   acetaminophen (TYLENOL) suppository 650 mg  650 mg Rectal Q6H PRN Karmen Bongo, MD       apixaban Arne [place]) tablet 5 mg  5 mg Oral BID Karmen Bongo, MD   5 mg at 02/18/21 0912   bisacodyl (DULCOLAX) EC tablet 5 mg  5 mg Oral Daily PRN Karmen Bongo, MD       cefTRIAXone (ROCEPHIN) 1 g in sodium chloride 0.9 % 100 mL IVPB  1 g Intravenous Q24H Karmen Bongo, MD 200 mL/hr at 02/18/21 0648 1 g at 02/18/21 0648   docusate sodium (COLACE) capsule 100 mg  100 mg Oral BID Karmen Bongo, MD   100 mg at 02/18/21 0912   DULoxetine (CYMBALTA) DR capsule 60 mg  60 mg Oral Daily Karmen Bongo, MD   60 mg at 02/18/21 0912   feeding supplement (GLUCERNA SHAKE) (GLUCERNA SHAKE) liquid 237 mL  237 mL Oral TID BM Lavina Hamman, MD   237 mL at 32/54/98 2641   folic acid (FOLVITE) tablet 1 mg  1 mg Oral Daily Lavina Hamman, MD   1 mg at 02/18/21 5830   hydrALAZINE (APRESOLINE) injection 5 mg  5 mg Intravenous Q4H PRN Karmen Bongo, MD       insulin aspart (novoLOG) injection 0-15 Units  0-15 Units Subcutaneous TID WC Karmen Bongo, MD   8 Units at 02/18/21 0913   insulin aspart (novoLOG) injection 0-5 Units  0-5 Units Subcutaneous QHS Karmen Bongo, MD   5 Units at 02/16/21 2142   insulin aspart (novoLOG) injection 6 Units  6 Units Subcutaneous TID WC Lavina Hamman, MD   6 Units at 02/18/21 0913   insulin glargine-yfgn (SEMGLEE) injection 35 Units  35 Units Subcutaneous BID Lavina Hamman, MD   35 Units at 02/18/21 9407   lactated ringers infusion   Intravenous Continuous Karmen Bongo, MD       levothyroxine (SYNTHROID, LEVOTHROID) injection 62.5 mcg  62.5 mcg Intravenous Daily Lavina Hamman, MD   62.5 mcg at 02/18/21 0646   mirabegron ER (MYRBETRIQ) tablet 25 mg  25 mg Oral Daily Lavina Hamman, MD    25 mg at 02/18/21 0912   morphine 2 MG/ML injection 2 mg  2 mg Intravenous Q2H PRN Karmen Bongo, MD       ondansetron [facility]) tablet 4 mg  4 mg Oral Q6H PRN Karmen Bongo, MD       Or   ondansetron [facility]) injection 4 mg  4 mg Intravenous Q6H PRN Karmen Bongo, MD       oxyCODONE (Oxy IR/ROXICODONE) immediate release tablet 5 mg  5 mg Oral Q4H PRN Karmen Bongo, MD       polyethylene glycol (MIRALAX / GLYCOLAX) packet 17 g  17 g Oral Daily Lavina Hamman, MD   17 g at 02/18/21 0912   thiamine (B-1) injection 100 mg  100 mg Intravenous Daily Lavina Hamman, MD  100 mg at 02/18/21 0913   Vitamin D (Ergocalciferol) (DRISDOL) capsule 50,000 Units  50,000 Units Oral Q7 days Lavina Hamman, MD   50,000 Units at 02/17/21 1034     Discharge Medications: Please see discharge summary for a list of discharge medications.  Relevant Imaging Results:  Relevant Lab Results:   Additional Information SSN: [id number]. No Covid vaccines in system.  Benard Halsted, LCSW

## 2021-02-18 NOTE — Progress Notes (Signed)
Physical Therapy Treatment Patient Details Name: Jaclyn [name] MRN: [id number] DOB: 1936-01-08 Today's Date: 02/18/2021   History of Present Illness Pt presented to ED on 12/30 with failure to thrive. PMH includes COVID, CVA, dementia, HTN, hypothyroidism, recurrent UTI, arthritis.    PT Comments    Pt lethargic on entrance, but became more alert when sitting up on edge of bed. Attempted to pivot to bedside commode, but not yet able to do so with +2 assist. Pt son reports decline in mobility since October; he is still agreeable to SNF.     Recommendations for follow up therapy are one component of a multi-disciplinary discharge planning process, led by the attending physician.  Recommendations may be updated based on patient status, additional functional criteria and insurance authorization.  Follow Up Recommendations  Skilled nursing-short term rehab (<3 hours/day)     Assistance Recommended at Discharge Frequent or constant Supervision/Assistance  Equipment Recommendations  Wheelchair (measurements PT);Wheelchair cushion (measurements PT)    Recommendations for Other Services       Precautions / Restrictions Precautions Precautions: Fall Restrictions Weight Bearing Restrictions: No     Mobility  Bed Mobility Overal bed mobility: Needs Assistance Bed Mobility: Sidelying to Sit;Sit to Sidelying   Sidelying to sit: Max assist;+2 for physical assistance     Sit to sidelying: Min assist General bed mobility comments: Pt asleep upon arrival, difficult to arouse initially, requiring maxA to progress from sidelying to sit as she frequently tried to return her legs back to the bed. Less assist to return to sidelying position    Transfers Overall transfer level: Needs assistance   Transfers: Sit to/from Stand Sit to Stand: Max assist;+2 physical assistance           General transfer comment: MaxA + 2 to rise to partial standing from edge of bed x 2, pt unable to clear  buttocks enough to pivot over to [facility]    Ambulation/Gait                   Stairs             Wheelchair Mobility    Modified Rankin (Stroke Patients Only)       Balance Overall balance assessment: Needs assistance Sitting-balance support: Single extremity supported;Feet supported Sitting balance-Leahy Scale: Poor Sitting balance - Comments: reliant on at least single UE support, initially requiring minA, progressing to min guardA                                    Cognition Arousal/Alertness: Awake/alert Behavior During Therapy: WFL for tasks assessed/performed Overall Cognitive Status: History of cognitive impairments - at baseline                                 General Comments: Hx dementia, pt son reports pt more "irritable than normal." HOH so therefore had some difficulty following commands        Exercises      General Comments        Pertinent Vitals/Pain Pain Assessment: Faces Faces Pain Scale: No hurt    Home Living                          Prior Function            PT Goals (current goals can  now be found in the care plan section) Acute Rehab PT Goals Patient Stated Goal: Son hopes pt can become more mobile with treatment Potential to Achieve Goals: Fair Progress towards PT goals: Progressing toward goals    Frequency    Min 2X/week      PT Plan Frequency needs to be updated    Co-evaluation              AM-PAC PT "6 Clicks" Mobility   Outcome Measure  Help needed turning from your back to your side while in a flat bed without using bedrails?: A Little Help needed moving from lying on your back to sitting on the side of a flat bed without using bedrails?: Total Help needed moving to and from a bed to a chair (including a wheelchair)?: Total Help needed standing up from a chair using your arms (e.g., wheelchair or bedside chair)?: Total Help needed to walk in hospital room?:  Total Help needed climbing 3-5 steps with a railing? : Total 6 Click Score: 8    End of Session Equipment Utilized During Treatment: Gait belt Activity Tolerance: Patient limited by fatigue Patient left: in bed;with call bell/phone within reach;with family/visitor present Nurse Communication: Mobility status PT Visit Diagnosis: Other abnormalities of gait and mobility (R26.89);Muscle weakness (generalized) (M62.81);Adult, failure to thrive (R62.7)     Time: 1155-1220 PT Time Calculation (min) (ACUTE ONLY): 25 min  Charges:  $Therapeutic Activity: 23-37 mins                     Wyona Almas, PT, DPT Acute Rehabilitation Services Pager [phone] Office [phone]    Deno Etienne 02/18/2021, 2:30 PM

## 2021-02-18 NOTE — Consult Note (Signed)
Consultation Note Date: 02/18/2021   Patient Name: Jaclyn [name]  DOB: [birth year]  MRN: [id number]  Age / Sex: 86 y.o., female  PCP: Jinny Sanders, MD Referring Physician: Oren Binet*  Reason for Consultation: Establishing goals of care and Psychosocial/spiritual support  HPI/Patient Profile: 86 y.o. female   admitted on 02/15/2021 with medical history significant of afib; fibromyalgia/dysthymia; interstitial cystitis; HTN; HLD; uncontrolled DM; and hypothyroidism presenting with FTT.     Son reports continued physical and functional decline since October 2022.  He reports significant urinary incontinence, weight loss.   Patient lives at home with her son.  He is concerned  about increasing personal care needs.  Dr Bedsole/[place] [facility] is her PCP    Family face treatment option decisions, advanced directive decisions and anticipatory care needs.     Clinical Assessment and Goals of Care:   This NP Wadie Lessen reviewed medical records, received report from team, assessed the patient and then meet at the patient's bedside and spoke to her son by phone  to discuss diagnosis, prognosis, GOC,  disposition and options.   Concept of Palliative Care was introduced as specialized medical care for people and their families living with serious illness.  If focuses on providing relief from the symptoms and stress of a serious illness.  The goal is to improve quality of life for both the patient and the family. Values and goals of care important to patient and family were attempted to be elicited.  Education offered on patient's current medical situation; urinary tract infection, hypothyroidism, poorly controlled diabetes and the effects of multiple comorbidities on overall health and wellness. Education offered regarding urinalysis, and treatment for UTI as well as abnormal TSH and T4 and  treatment of hypothyroidism.     Created space and opportunity for patient  and family to explore thoughts and feelings regarding current medical situation.  Her son describes a continued physical and functional decline since October 2022.  He has concerns over patient's increasing personal care needs and physical decline at home.     A  discussion was had today regarding advanced directives.  Concepts specific to code status, artifical feeding and hydration, continued IV antibiotics and rehospitalization was had.    The difference between a aggressive medical intervention path  and a palliative comfort care path for this patient at this time was had.     Questions and concerns addressed.  Patient  encouraged to call with questions or concerns.     PMT will continue to support holistically.  Plan is to meet with son at bedside tomorrow morning at 8 AM.    No documented healthcare power of attorney or advanced care planning documents.  Her son Myrth Dahan is her main caregiver, support person and decision maker at this point in time.    SUMMARY OF RECOMMENDATIONS    Code Status/Advance Care Planning: Full code Educated family to consider DNR/DNI status understanding evidenced based poor outcomes in similar hospitalized patient, as the cause of arrest is likely  associated with advanced chronic illness rather than an easily reversible acute cardio-pulmonary event.     Palliative Prophylaxis:  Aspiration, Bowel Regimen, Delirium Protocol, Frequent Pain Assessment, and Oral Care  Additional Recommendations (Limitations, Scope, Preferences): Full Scope Treatment    Prognosis:  Unable to determine  Discharge Planning: To Be Determined      Primary Diagnoses: Present on Admission:  UTI (urinary tract infection)  Failure to thrive in adult  Dyslipidemia  Hypothyroidism  PAROXYSMAL ATRIAL FIBRILLATION  Poorly controlled type 2 diabetes mellitus with complication (HCC)   Incontinence   I have reviewed the medical record, interviewed the patient and family, and examined the patient. The following aspects are pertinent.  Past Medical History:  Diagnosis Date   Atrial fibrillation ([place])    Benign neoplasm of colon    Carotid artery occlusion    60-79% right ICA stenosis   Difficult intubation 02/17/2006   During surgery to remove large polyp   Diverticulosis of colon (without mention of hemorrhage)    Dysthymic disorder    Fibromyalgia    Headache(784.0)    Insomnia, unspecified    Interstitial cystitis    Osteoarthrosis, unspecified whether generalized or localized, unspecified site    Other and unspecified hyperlipidemia    Other chest pain    Other specified benign mammary dysplasias    TIA (transient ischemic attack)    Type II or unspecified type diabetes mellitus without mention of complication, not stated as uncontrolled    Unspecified essential hypertension    Unspecified hypothyroidism    Unspecified vitamin D deficiency    Urinary tract infection, site not specified    Social History   Socioeconomic History   Marital status: Single    Spouse name: Not on file   Number of children: 3   Years of education: Not on file   Highest education level: Not on file  Occupational History   Occupation: retired    Fish farm manager: RETIRED  Tobacco Use   Smoking status: Never   Smokeless tobacco: Never  Substance and Sexual Activity   Alcohol use: No    Alcohol/week: 0.0 standard drinks   Drug use: No   Sexual activity: Not on file  Other Topics Concern   Not on file  Social History Narrative   1 child died at 52 months old --hx of downs syndrome  Lives with son in a 2 story home.  Uses the 1st floor.  Has two living children.  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     Social Determinants of Health   Financial Resource Strain: High Risk   Difficulty of Paying Living Expenses: Hard  Food Insecurity: Not on file  Transportation Needs:  Not on file  Physical Activity: Not on file  Stress: Not on file  Social Connections: Not on file   Family History  Problem Relation Age of Onset   Lymphoma Father    Hypertension Father    Stroke Father    Hyperlipidemia Father    Hypertension Mother    Allergies Brother    Hypertension Sister        3   Breast cancer Sister    Fibromyalgia Sister        3   Hyperlipidemia Sister        3   Scheduled Meds:  apixaban  5 mg Oral BID   docusate sodium  100 mg Oral BID   DULoxetine  60 mg Oral Daily   feeding supplement (GLUCERNA SHAKE)  237  mL Oral TID BM   folic acid  1 mg Oral Daily   insulin aspart  0-15 Units Subcutaneous TID WC   insulin aspart  0-5 Units Subcutaneous QHS   insulin aspart  6 Units Subcutaneous TID WC   insulin glargine-yfgn  35 Units Subcutaneous BID   levothyroxine  62.5 mcg Intravenous Daily   mirabegron ER  25 mg Oral Daily   polyethylene glycol  17 g Oral Daily   thiamine injection  100 mg Intravenous Daily   Vitamin D (Ergocalciferol)  50,000 Units Oral Q7 days   Continuous Infusions:  cefTRIAXone (ROCEPHIN)  IV 1 g (02/18/21 0648)   lactated ringers     PRN Meds:.acetaminophen **OR** acetaminophen, bisacodyl, hydrALAZINE, morphine injection, ondansetron **OR** ondansetron (ZOFRAN) IV, oxyCODONE Medications Prior to Admission:  Prior to Admission medications   Medication Sig Start Date End Date Taking? Authorizing Provider  apixaban (ELIQUIS) 5 MG TABS tablet TAKE 1 TABLET BY MOUTH TWICE A DAY Patient taking differently: Take 5 mg by mouth 2 (two) times daily. 12/11/20  Yes Bedsole, Amy E, MD  Cholecalciferol (VITAMIN D3) 1.25 MG (50000 UT) CAPS Take 1 capsule by mouth once a week. Patient taking differently: Take 50,000 Units by mouth once a week. 12/27/20  Yes Bedsole, Amy E, MD  DULoxetine (CYMBALTA) 30 MG capsule TAKE 2 CAPSULES BY MOUTH EVERY DAY Patient taking differently: Take 60 mg by mouth daily. 12/27/19  Yes Bedsole, Amy E, MD   insulin glargine (LANTUS SOLOSTAR) 100 UNIT/ML Solostar Pen Inject 70 Units into the skin daily. 40 units in the morning and 30 units in the evening Patient taking differently: Inject 35 Units into the skin 2 (two) times daily. 12/09/20  Yes Bedsole, Amy E, MD  JANUVIA 100 MG tablet TAKE 1 TABLET BY MOUTH EVERY DAY Patient taking differently: Take 100 mg by mouth daily. 01/07/21  Yes Bedsole, Amy E, MD  levothyroxine (SYNTHROID) 125 MCG tablet Take 1 tablet (125 mcg total) by mouth daily. 12/27/20  Yes Bedsole, Amy E, MD  trimethoprim (TRIMPEX) 100 MG tablet TAKE 1 TABLET BY MOUTH EVERY DAY Patient taking differently: Take 100 mg by mouth daily. 12/12/20  Yes Bedsole, Amy E, MD  B-D ULTRAFINE III SHORT PEN 31G X 8 MM MISC USE TO INJECT INSULIN ONCE DAILY. DX: E11.65 07/02/20   Jinny Sanders, MD   Allergies  Allergen Reactions   Naproxen Sodium Other (See Comments)    Fever/aches and pains   Statins Other (See Comments)    myalgias   Sulfonamide Derivatives Hives and Itching   Latex Itching and Rash   Shellfish Allergy Itching, Swelling and Rash    Seafood, shrimp   Review of Systems  Unable to perform ROS: Mental status change   Physical Exam Constitutional:      Appearance: She is underweight. She is ill-appearing.  Cardiovascular:     Rate and Rhythm: Normal rate.  Pulmonary:     Effort: Pulmonary effort is normal.  Musculoskeletal:     Comments: Generalized weakness  Skin:    General: Skin is warm and dry.  Neurological:     Mental Status: She is lethargic.    Vital Signs: BP (!) 90/52 (BP Location: Right Arm)    Pulse 89    Temp 97.7 F (36.5 C) (Oral)    Resp 17    Ht 5\' 2"  (1.575 m)    Wt 68.5 kg    SpO2 97%    BMI 27.62 kg/m  Pain Scale:  PAINAD   Pain Score: 0-No pain   SpO2: SpO2: 97 % O2 Device:SpO2: 97 % O2 Flow Rate: .   IO: Intake/output summary:  Intake/Output Summary (Last 24 hours) at 02/18/2021 1349 Last data filed at 02/17/2021 2200 Gross per 24  hour  Intake 240 ml  Output --  Net 240 ml    LBM: Last BM Date: 02/17/21 (per patient's son) Baseline Weight: Weight: 68.5 kg Most recent weight: Weight: 68.5 kg      Palliative Assessment/Data: 30 % at best      Signed by: Wadie Lessen, NP   Please contact Palliative Medicine Team phone at [phone] for questions and concerns.  For individual provider: See Shea Evans

## 2021-02-19 ENCOUNTER — Inpatient Hospital Stay (HOSPITAL_COMMUNITY): Payer: Medicare Other

## 2021-02-19 DIAGNOSIS — R531 Weakness: Secondary | ICD-10-CM | POA: Diagnosis not present

## 2021-02-19 DIAGNOSIS — R638 Other symptoms and signs concerning food and fluid intake: Secondary | ICD-10-CM

## 2021-02-19 DIAGNOSIS — R627 Adult failure to thrive: Secondary | ICD-10-CM | POA: Diagnosis not present

## 2021-02-19 DIAGNOSIS — Z515 Encounter for palliative care: Secondary | ICD-10-CM | POA: Diagnosis not present

## 2021-02-19 LAB — GLUCOSE, CAPILLARY
Glucose-Capillary: 310 mg/dL — ABNORMAL HIGH (ref 70–99)
Glucose-Capillary: 142 mg/dL — ABNORMAL HIGH (ref 70–99)
Glucose-Capillary: 120 mg/dL — ABNORMAL HIGH (ref 70–99)
Glucose-Capillary: 111 mg/dL — ABNORMAL HIGH (ref 70–99)

## 2021-02-19 NOTE — Progress Notes (Signed)
Son noted changes in patient and alerted RN to bedside.  This RN noted that patient's speech was unclear which is different from previous assessment.  MD is notified and Rapid Response called. An order for CT head received.  Will continue to monitor.

## 2021-02-19 NOTE — Progress Notes (Signed)
TRH night cross cover note:  Regarding this patient who is hospitalized for acute encephalopathy, potentially in setting of urinary tract infection, I was notified by RN that the patient seemed slightly more confused this evening, and was demonstrating some evidence of potential "word salad" around 2330, with the latter subsequently resolving in the absence of any residual acute focal neurologic deficits. VSS. CBG 330.  Noncontrast CT head was ordered, and showed no evidence of acute intracranial process, including no evidence of intracranial hemorrhage, acute ischemic infarct, and no interval changes relative to CT head that was performed on 02/15/2021.    Babs Bertin, DO Hospitalist

## 2021-02-19 NOTE — Evaluation (Signed)
Speech Language Pathology Evaluation Patient Details Name: Jaclyn [name] MRN: [id number] DOB: 1935/12/10 Today's Date: 02/19/2021 Time: 5009-3818 SLP Time Calculation (min) (ACUTE ONLY): 21 min  Problem List:  Patient Active Problem List   Diagnosis Date Noted   Incontinence 02/17/2021   Ataxia 02/17/2021   UTI (urinary tract infection) 02/16/2021   Failure to thrive in adult 02/16/2021   Mixed stress and urge urinary incontinence 02/05/2021   Yeast infection 12/03/2020   Hypokalemia    Labile blood glucose    Uncontrolled type 2 diabetes mellitus with hyperglycemia (HCC)    Orthostatic hypotension    Elevated blood pressure reading    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   Recurrent UTI    Dyslipidemia    Orthostasis    Dysarthria    Vagina, candidiasis 03/10/2019   Dementia ([place]) 03/10/2019   Acute respiratory failure with hypoxia ([place]) 29/93/7169   Acute metabolic encephalopathy 67/89/3810   COVID-19 virus infection 01/15/2019   Acute lower UTI 01/15/2019   B12 deficiency 01/22/2018   History of CVA (cerebrovascular accident) 06/20/2017   Stenosis of right carotid artery    Peripheral edema 07/29/2016   Imbalance 04/07/2016   Intertriginous candidiasis 04/26/2015   Hard of hearing 04/12/2015   TIA (transient ischemic attack) 04/10/2015   History of recurrent UTIs 04/10/2015   Dysuria 03/09/2015   Tremor 05/11/2014   Leg weakness, bilateral 05/11/2014   Fatty liver 08/04/2013   Personal history of colonic polyps 10/05/2012   Esophageal reflux 10/05/2012   Carotid stenosis, symptomatic, with infarction ([place]) 10/14/2010   MDD (major depressive disorder), single episode, moderate ([employer]) 01/13/2008   Essential hypertension, benign 01/13/2008   PAROXYSMAL ATRIAL FIBRILLATION 01/13/2008   Intracranial vascular stenosis 01/13/2008   DIVERTICULOSIS OF COLON 01/13/2008   Fibromyalgia 01/13/2008   CHEST PAIN, ATYPICAL 01/13/2008   Hypothyroidism  04/13/2007   Poorly controlled type 2 diabetes mellitus with complication ([place]) 17/51/0258   Vitamin D deficiency 04/13/2007   Hyperlipidemia LDL goal <70 04/13/2007   DEGENERATIVE JOINT DISEASE 04/13/2007   Past Medical History:  Past Medical History:  Diagnosis Date   Atrial fibrillation ([place])    Benign neoplasm of colon    Carotid artery occlusion    60-79% right ICA stenosis   Difficult intubation 02/17/2006   During surgery to remove large polyp   Diverticulosis of colon (without mention of hemorrhage)    Dysthymic disorder    Fibromyalgia    Headache(784.0)    Insomnia, unspecified    Interstitial cystitis    Osteoarthrosis, unspecified whether generalized or localized, unspecified site    Other and unspecified hyperlipidemia    Other chest pain    Other specified benign mammary dysplasias    TIA (transient ischemic attack)    Type II or unspecified type diabetes mellitus without mention of complication, not stated as uncontrolled    Unspecified essential hypertension    Unspecified hypothyroidism    Unspecified vitamin D deficiency    Urinary tract infection, site not specified    Past Surgical History:  Past Surgical History:  Procedure Laterality Date   ABDOMINAL HYSTERECTOMY  1988   cervical dysplasia   CARDIAC CATHETERIZATION  02/19/06   EF 60%   COLONOSCOPY     ENDARTERECTOMY Right 08/26/2019   Procedure: RIGHT CAROTID ENDARTERECTOMY;  Surgeon: Rosetta Posner, MD;  Location: [facility] OR;  Service: Vascular;  Laterality: Right;   PATCH ANGIOPLASTY Right 08/26/2019   Procedure: PATCH ANGIOPLASTY OF RIGHT COMMON CAROTID ARTERY USING HEMASHIELD PLATINUM  FINESSE PATCH;  Surgeon: Rosetta Posner, MD;  Location: [facility] OR;  Service: Vascular;  Laterality: Right;   RIGHT COLECTOMY  2005   for villous adenoma of the cecum Dr.streck   VESICOVAGINAL FISTULA CLOSURE W/ [place]   w/ cystocele &retocele repairs Dr. Ree Edman   HPI:  86 year old female was admitted 02/16/2021 with  subacute worsening of functional status/FTT.  She had been having urinary incontinence, increased confusion much worse than baseline mild dementia, decreased p.o. intake and on ability to care for herself.  Work-up in the ED was notable for uncontrolled hypothyroidism, marked hyperglycemia and UTI.  Pt with PMHX of atrial fibrillation on Eliquis, fibromyalgia, HTN, poorly controlled DM.  IPR admission in July 2022 with ST for cognitive deficits.   Assessment / Plan / Recommendation Clinical Impression  Pt presents with moderate receptive and expressive language deficits in setting of acute encephalopathy 2/2 UTI and hyperglycemia.  Pt was assessed informally today.  Pt with a hx of dementia, but supportive son Hilliard Clark reports significant difference from baseline.  Overnight pt had significant expressive language deficits with imaging negative for acute findings.  Pt's speech has improved from reported "word salad" and she was comprehensible and appropriate today with no dysarthria noted.  She is hard of hearing and had difficulty attending to stimuli presented by SLP.  Pt awake on arrival and chewing on hospital gown.  She was initiatlly resistant to assistance from SLP, but later allowed SLP to replace gown.  She later became drowsy and fell asleep intermittently during today's session, but could be roused and briefly redirected.  Pt required frequent repetition throughout asessment.  Son reports pt is hard of hearing.  Pt answered yes/no questions with 80% accuracy for personal information and immediate environment.  Pt completed confrontational naming task with 50% accuracy.  Semantic paraphasias ("pencil" for "pen", "pipe" for "straw") and perseveration ("that's another pillow") noted. Pt followed 1 step commands  with 40% accuracy, improving to 60% accuracy with visual model.  Hopefully, current decline in function is related to UTI and encephalopathy will resolve with treatment.  SLP to follow for language  intervention and reassessment as needed.    SLP Assessment  SLP Recommendation/Assessment: Patient needs continued Speech [place] [facility] SLP Visit Diagnosis: Aphasia (R47.01)    Recommendations for follow up therapy are one component of a multi-disciplinary discharge planning process, led by the attending physician.  Recommendations may be updated based on patient status, additional functional criteria and insurance authorization.    Follow Up Recommendations  Follow physician's recommendations for discharge plan and follow up therapies (Continue ST at next level of care)    Assistance Recommended at Discharge  Frequent or constant Supervision/Assistance  Functional Status Assessment Patient has had a recent decline in their functional status and demonstrates the ability to make significant improvements in function in a reasonable and predictable amount of time.  Frequency and Duration min 2x/week  2 weeks      SLP Evaluation Cognition  Overall Cognitive Status: History of cognitive impairments - at baseline Arousal/Alertness: Awake/alert (Intermittent drowsiness) Orientation Level: Oriented to person;Disoriented to place;Disoriented to time;Disoriented to situation       Comprehension  Auditory Comprehension Overall Auditory Comprehension: Impaired Yes/No Questions: Impaired Basic Biographical Questions: 76-100% accurate Commands: Impaired One Step Basic Commands: 25-49% accurate Interfering Components: Attention;Hearing Visual Recognition/Discrimination Discrimination: Not tested Reading Comprehension Reading Status: Not tested    Expression Expression Primary Mode of Expression: Verbal Verbal Expression Overall Verbal Expression: Impaired Automatic  Speech: Social Response;Name Level of Generative/Spontaneous Verbalization: Sentence Naming: Impairment Responsive: 26-50% accurate Confrontation: Impaired Interfering Components: Attention   Oral / Motor   Motor Speech Overall Motor Speech: Appears within functional limits for tasks assessed Respiration: Within functional limits Phonation: Normal Resonance: Within functional limits Articulation: Within functional limitis Intelligibility: Intelligible Motor Planning: Witnin functional limits Motor Speech Errors: Not applicable            Celedonio Savage, [place], [place] Office: [phone]  02/19/2021, 12:04 PM

## 2021-02-19 NOTE — Progress Notes (Addendum)
Patient ID: [name], female   DOB: 01-14-1936, 86 y.o.   MRN: [id number]    Progress Note from the Palliative Medicine Team at [facility]   Patient Name: Jaclyn [name]        Date: 02/19/2021 DOB: 12-May-1935  Age: 86 y.o. MRN#: [id number] Attending Physician: Oren Binet* Primary Care Physician: Jinny Sanders, MD Admit Date: 02/15/2021   Medical records reviewed    86 y.o. female   admitted on 02/15/2021 with medical history significant of afib; fibromyalgia/dysthymia; interstitial cystitis; HTN; HLD; uncontrolled DM; and hypothyroidism presenting with FTT.      Son reports continued physical and functional decline since October 2022.  He reports significant urinary incontinence, weight loss.   Patient lives at home with her son.  He is concerned  about increasing personal care needs.   Today is day 3 of this hospitalization a Rapid Response was called last night  2/2 to new onset unclear speech   FINDINGS: Brain: There is moderate severity cerebral atrophy with widening of the extra-axial spaces and ventricular dilatation. There are areas of decreased attenuation within the white matter tracts of the supratentorial brain, consistent with microvascular disease changes.   There is bilateral, predominant symmetric basal ganglia calcification.   Vascular: No hyperdense vessels are identified.   Skull: Normal. Negative for fracture or focal lesion.   Sinuses/Orbits: No acute finding.   Other: None.   IMPRESSION: 1. Generalized cerebral atrophy. 2. No acute intracranial abnormality.  Patient is high risk for continued failure to thrive.   Family face treatment option decisions, advanced directive decisions and anticipatory care needs   This NP visited patient at the bedside as a follow up to  yesterday's GOCs meeting and to met with son/[name] Rich Reining as scheduled to discuss current medical situation.  Again today education was offered on concept  specific to adult failure to thrive and the limitations of medical interventions to prolong quality of life when the body fails to thrive.  Education regarding the natural trajectory of failure to thrive specific to decreased oral intake, decreased mobility and increased sleepiness  Plan of care -Partial code, no intubation      -Educated family to consider full DNR/DNI status understanding evidenced based poor outcomes in similar hospitalized patient, as the cause of arrest is likely associated with advanced chronic illness rather than an easily reversible acute cardio-pulmonary event.  -At this time family is open to all offered and available medical interventions to prolong life.  Son is hopeful for signs of improvement.    (Education offered on risks and benefits of artificial feeding and hydration.,  son does not feel that his mother would want a feeding tube but would make that call when faced with decision) -Continue to treat the treatable -Transition to skilled nursing facility for rehabilitation -Outpatient palliative services to follow at facility, hopefully to continue conversations regarding future treatment plan.  Education offered on hospice benefit; philosophy and eligibility.   Discussed with son the importance of continued conversation with  medical providers regarding overall plan of care and treatment options,  ensuring decisions are within the context of the patients values and GOCs.  MOST form introduced, declined completion   Questions and concerns addressed   Discussed with attending team    Wadie Lessen NP  Palliative Medicine Team Team Phone # [phone] Pager [phone]

## 2021-02-19 NOTE — Progress Notes (Signed)
Occupational Therapy Treatment Patient Details Name: Jaclyn [name] MRN: [id number] DOB: May 30, 1935 Today's Date: 02/19/2021   History of present illness Pt presented to ED on 12/30 with failure to thrive. PMH includes COVID, CVA, dementia, HTN, hypothyroidism, recurrent UTI, arthritis.   OT comments  OT treatment session with focus on cognitive reorientation, bed mobility and self-care re-education. Session limited 2/2 agitation. Patient making slow progress toward goals. Continues to be limited by deficits listed below requiring +2 assist for bed mobility and functional transfers. Recommendation for SNF remains appropriate. OT will continue to follow acutely.    Recommendations for follow up therapy are one component of a multi-disciplinary discharge planning process, led by the attending physician.  Recommendations may be updated based on patient status, additional functional criteria and insurance authorization.    Follow Up Recommendations  Skilled nursing-short term rehab (<3 hours/day)    Assistance Recommended at Discharge Frequent or constant Supervision/Assistance  Patient can return home with the following  Two people to help with walking and/or transfers;Two people to help with bathing/dressing/bathroom;Assistance with cooking/housework   Equipment Recommendations  None recommended by OT    Recommendations for Other Services      Precautions / Restrictions Precautions Precautions: Fall Restrictions Weight Bearing Restrictions: No       Mobility Bed Mobility Overal bed mobility: Needs Assistance Bed Mobility: Rolling;Sidelying to Sit;Sit to Supine Rolling: Max assist Sidelying to sit: Max assist   Sit to supine: Max assist   General bed mobility comments: Max A for all parts of bed mobility with Max cues.    Transfers Overall transfer level: Needs assistance                 General transfer comment: Deferred 2/2 poor sitting balance and increased risk  of falls.     Balance Overall balance assessment: Needs assistance Sitting-balance support: Feet supported;Bilateral upper extremity supported Sitting balance-Leahy Scale: Poor Sitting balance - Comments: Anterior lean; poor trunk control despite max cues. Postural control: Other (comment) (anterior)                                 ADL either performed or assessed with clinical judgement   ADL Overall ADL's : Needs assistance/impaired     Grooming: Moderate assistance;Sitting                                      Extremity/Trunk Assessment         Cervical / Trunk Assessment Cervical / Trunk Assessment: Kyphotic    Vision   Vision Assessment?: No apparent visual deficits   Perception     Praxis      Cognition Arousal/Alertness: Lethargic;Awake/alert (Lethargic initially but alertness improved with participation) Behavior During Therapy: Flat affect;Agitated Overall Cognitive Status: History of cognitive impairments - at baseline Area of Impairment: Attention;Orientation;Following commands;Safety/judgement;Awareness                 Orientation Level: Disoriented to;Place;Time;Situation Current Attention Level: Focused Memory: Decreased short-term memory;Decreased recall of precautions Following Commands: Follows one step commands inconsistently Safety/Judgement: Decreased awareness of deficits;Decreased awareness of safety Awareness: Intellectual Problem Solving: Requires tactile cues;Requires verbal cues General Comments: Follows 1-step verbal commands with 50% accuracy; poor participation; agitation increases with alertness. Functional Status Assessment: Patient has had a recent decline in their functional status and demonstrates the ability to make significant  improvements in function in a reasonable and predictable amount of time.        Exercises     Shoulder Instructions       General Comments Son present at bedside  but left room upon therapist entry.    Pertinent Vitals/ Pain       Pain Assessment: Faces Faces Pain Scale: No hurt Breathing: normal Negative Vocalization: none Facial Expression: smiling or inexpressive Body Language: relaxed Consolability: no need to console PAINAD Score: 0 Pain Intervention(s): Monitored during session  Home Living Family/patient expects to be discharged to:: Private residence Living Arrangements: Children Available Help at Discharge: Family;Available PRN/intermittently Type of Home: House Home Access: Level entry     Home Layout: One level     Bathroom Shower/Tub: Tub/shower unit;Curtain   Biochemist, clinical: Standard Bathroom Accessibility: Yes   Home Equipment: Cane - single point;Grab bars - tub/shower      Lives With: Son    Prior Functioning/Environment              Frequency  Min 2X/week        Progress Toward Goals  OT Goals(current goals can now be found in the care plan section)  Progress towards OT goals: Progressing toward goals  Acute Rehab OT Goals Patient Stated Goal: No goals stated. OT Goal Formulation: With patient Time For Goal Achievement: 03/03/21 Potential to Achieve Goals: Good ADL Goals Pt Will Perform Grooming: with set-up;sitting Pt Will Perform Upper Body Bathing: with set-up;sitting Pt Will Perform Lower Body Dressing: with min guard assist;sit to/from stand Pt Will Transfer to Toilet: with min guard assist;stand pivot transfer  Plan Discharge plan remains appropriate;Frequency remains appropriate    Co-evaluation                 AM-PAC OT "6 Clicks" Daily Activity     Outcome Measure   Help from another person eating meals?: A Little Help from another person taking care of personal grooming?: A Little Help from another person toileting, which includes using toliet, bedpan, or urinal?: A Lot Help from another person bathing (including washing, rinsing, drying)?: A Lot Help from another  person to put on and taking off regular upper body clothing?: A Lot Help from another person to put on and taking off regular lower body clothing?: A Lot 6 Click Score: 14    End of Session Equipment Utilized During Treatment: Gait belt  OT Visit Diagnosis: Unsteadiness on feet (R26.81);Other abnormalities of gait and mobility (R26.89);Muscle weakness (generalized) (M62.81);Pain   Activity Tolerance Treatment limited secondary to agitation   Patient Left in bed;with call bell/phone within reach;with bed alarm set   Nurse Communication Mobility status        Time: 8616-8372 OT Time Calculation (min): 14 min  Charges: OT General Charges $OT Visit: 1 Visit OT Treatments $Self Care/Home Management : 8-22 mins  Jaclyn [name] H. OTR/L Supplemental OT, Department of rehab services [phone]  Jaclyn [name] R H. 02/19/2021, 1:10 PM

## 2021-02-19 NOTE — TOC Progression Note (Signed)
Transition of Care [facility]) - Progression Note    Patient Details  Name: Jaclyn [name] MRN: [id number] Date of Birth: [birth year]  [facility] CM/SW [place], LCSW Phone Number: 02/19/2021, 3:23 PM  Clinical Narrative:    CSW spoke with patient's son. He has selected [employer] as it is on the way home from work for him. CSW made [facility] liaison aware and will obtain insurance authorization when medically stable.     Expected Discharge Plan: Skilled Nursing Facility Barriers to Discharge: Continued Medical Work up, SNF Pending bed offer, Ship broker  Expected Discharge Plan and Services Expected Discharge Plan: [place] In-house Referral: [facility], Clinical Social Work Discharge Planning Services: CM Consult Post Acute Care Choice: [place] Living arrangements for the past 2 months: Single Family Home                                       Social Determinants of Health (SDOH) Interventions    Readmission Risk Interventions No flowsheet data found.

## 2021-02-19 NOTE — Care Management Important Message (Signed)
Important Message  Patient Details  Name: Jaclyn [name] MRN: [id number] Date of Birth: [birth year]   Medicare Important Message Given:  Yes     Opie Fanton [address] 02/19/2021, 2:39 PM

## 2021-02-19 NOTE — Progress Notes (Addendum)
PROGRESS NOTE    Jaclyn [name]  KKX:[id number]  DOB: 05/19/1935  DOA: 02/15/2021 PCP: Jinny Sanders, MD Outpatient Specialists:   Hospital course:  86 year old female with atrial fibrillation on Eliquis, fibromyalgia, HTN, poorly controlled DM was admitted 02/16/2021 with subacute worsening of functional status/FTT.  She had been having urinary incontinence, increased confusion much worse than baseline mild dementia, decreased p.o. intake and on ability to care for herself.  Work-up in the ED was notable for uncontrolled hypothyroidism, marked hyperglycemia and UTI.  Subjective:  Patient is hard of hearing, states she is fine.  She knows that she is in the hospital.  Is not exactly sure why she is here  Patient son states that she had an episode last night where "she was talking out of her head".  Feels like this was similar to previous strokes  Objective: Vitals:   02/19/21 0000 02/19/21 0734 02/19/21 1142 02/19/21 1551  BP: (!) 157/70 (!) 146/51 (!) 106/57 (!) 101/48  Pulse: 92 94 88 91  Resp: (!) 23 16 16 16   Temp:  98.1 F (36.7 C) 98.5 F (36.9 C) 98.1 F (36.7 C)  TempSrc:  Oral Oral Oral  SpO2:  97% 99% 97%  Weight:      Height:       No intake or output data in the 24 hours ending 02/19/21 1600  Filed Weights   02/15/21 1732  Weight: 68.5 kg     Exam:  General: Patient looks more alert today, is more quickly responsive, is more appropriate.   Eyes: sclera anicteric, conjuctiva mild injection bilaterally CVS: S1-S2, regular  Respiratory:  decreased air entry bilaterally secondary to decreased inspiratory effort, rales at bases  GI: NABS, soft, NT  LE: No edema.  Psych: Patient clearly has a cognitive abnormality however affect is much alive, much less flat. Neuro: Patient's speech is entirely fluid, has no evidence of dysarthria, fluent or nonfluent aphasia or any comprehension abnormalities.  She is moving all her limbs normally with good strength,  cranial nerves III through XII is intact.  Assessment & Plan:   Language abnormality seen late last night--see rapid response note This morning patient's neuro exam is normal Language and comprehension entirely intact CT done last night was normal Will order MRI, if positive can order Neuro consultation  Functional decline/FTT Patient seems to be clinically improving with thyroid replacement and treatment of UTI. Likely multifactorial from UTI, uncontrolled hypothyroidism, decreased p.o. intake and dehydration secondary to marked hyperglycemia on top of baseline dementia. There were some consideration of NPH given some ataxia however she has many other reasons to have functional decline and we can consider diagnostic work-up of NPH if warranted after acute issues have been addressed.  Appreciate PT and OT consultation Appreciate palliative care consultation regarding goals for care [facility] consulted for SNF placement as son does not feel he can take care of patient at home anymore.  Hypothyroidism TSH is 22, free T4 is 0.55 Patient has not been taking her Synthroid She was started on IV Synthroid x3 days yesterday Restart oral Synthroid once she has completed her IV course of treatment  UTI Continue Rocephin x 7 days for complicated UTI started 16/96/7893 Myrbetriq was added for incontinence  Uncontrolled DM2 Very much appreciate diabetes coordinator recommendations Lantus and aspart were increased as per recommendations  Disposition Patient will need to be discharged to a SNF, TOC aware Goals for care also being actively discussed via palliative care consultation    DVT  prophylaxis: Eliquis Code Status: Full Family Communication: Patient's son was at bedside throughout Disposition Plan:   Patient is from: Home  Anticipated Discharge Location: SNF  Barriers to Discharge: Subacute confusion  Is patient medically stable for Discharge: No   Scheduled Meds:  apixaban  5 mg  Oral BID   docusate sodium  100 mg Oral BID   DULoxetine  60 mg Oral Daily   feeding supplement (GLUCERNA SHAKE)  237 mL Oral TID BM   folic acid  1 mg Oral Daily   insulin aspart  0-15 Units Subcutaneous TID WC   insulin aspart  0-5 Units Subcutaneous QHS   insulin aspart  8 Units Subcutaneous TID WC   insulin glargine-yfgn  38 Units Subcutaneous BID   levothyroxine  62.5 mcg Intravenous Daily   mirabegron ER  25 mg Oral Daily   multivitamin with minerals  1 tablet Oral Daily   polyethylene glycol  17 g Oral Daily   thiamine injection  100 mg Intravenous Daily   Vitamin D (Ergocalciferol)  50,000 Units Oral Q7 days   Continuous Infusions:  cefTRIAXone (ROCEPHIN)  IV 1 g (02/19/21 0755)   lactated ringers      Data Reviewed:  Basic Metabolic Panel: Recent Labs  Lab 02/15/21 2046 02/15/21 2056 02/17/21 0533  NA 130* 134* 134*  K 3.9 4.1 3.5  CL 95*  --  101  CO2 22  --  23  GLUCOSE 356*  --  184*  BUN 11  --  10  CREATININE 0.90  --  0.62  CALCIUM 8.8*  --  8.5*     CBC: Recent Labs  Lab 02/15/21 2046 02/15/21 2056 02/17/21 0533  WBC 6.4  --  6.3  NEUTROABS 4.5  --   --   HGB 12.6 11.9* 11.1*  HCT 35.2* 35.0* 31.4*  MCV 92.6  --  92.9  PLT 247  --  220     Studies: CT HEAD WO CONTRAST (5MM)  Result Date: 02/19/2021 CLINICAL DATA:  Altered mental status. EXAM: CT HEAD WITHOUT CONTRAST TECHNIQUE: Contiguous axial images were obtained from the base of the skull through the vertex without intravenous contrast. COMPARISON:  February 15, 2021 FINDINGS: Brain: There is moderate severity cerebral atrophy with widening of the extra-axial spaces and ventricular dilatation. There are areas of decreased attenuation within the white matter tracts of the supratentorial brain, consistent with microvascular disease changes. There is bilateral, predominant symmetric basal ganglia calcification. Vascular: No hyperdense vessels are identified. Skull: Normal. Negative for fracture  or focal lesion. Sinuses/Orbits: No acute finding. Other: None. IMPRESSION: 1. Generalized cerebral atrophy. 2. No acute intracranial abnormality. Electronically Signed   By: Virgina Norfolk M.D.   On: 02/19/2021 00:55    Principal Problem:   Failure to thrive in adult Active Problems:   Hypothyroidism   Poorly controlled type 2 diabetes mellitus with complication ([place])   PAROXYSMAL ATRIAL FIBRILLATION   Dyslipidemia   UTI (urinary tract infection)   Incontinence   Ataxia     Dewaine Oats Derek Jack, [facility]  If 7PM-7AM, please contact night-coverage [email or web address]   LOS: 3 days

## 2021-02-19 NOTE — Significant Event (Signed)
Rapid Response Event Note   Reason for Call : AMS Initial Focused Assessment:  Notified of pt with new onset of unclear speech. LSW is unclear due to pt has been confused since admission to hospital 12/31. Pt's son was at bedside and noticed pt was no speaking clearly and word searching just like her previous strokes according to him. Pt just received Eliquis 2115 and was oriented to know her name, the month and her age accurately and answered clearly. Then when asked other questions she would have garbled speech. She has some hearing deficits. NIHSS 0. CBG 330. No gaze, visual deficit, extinction, or aphasia and pt is on Eliquis so no indication to call a code stroke. Dr. Velia Meyer notified and order received for Head CT. Pt is full code. Pallative Care has been consulted for [place] planning.   0001-HR 92, 157/70 (93), RR 23 sats 96% on RA.   Interventions:  -Head CT   MD Notified: Dr. Velia Meyer Call Time: 2336 Arrival Time: 2340 End Time: 0030  Madelynn Done, RN

## 2021-02-20 ENCOUNTER — Inpatient Hospital Stay (HOSPITAL_COMMUNITY): Payer: Medicare Other

## 2021-02-20 DIAGNOSIS — R627 Adult failure to thrive: Secondary | ICD-10-CM | POA: Diagnosis not present

## 2021-02-20 LAB — GLUCOSE, CAPILLARY
Glucose-Capillary: 85 mg/dL (ref 70–99)
Glucose-Capillary: 76 mg/dL (ref 70–99)
Glucose-Capillary: 98 mg/dL (ref 70–99)
Glucose-Capillary: 319 mg/dL — ABNORMAL HIGH (ref 70–99)

## 2021-02-20 MED ORDER — INSULIN ASPART 100 UNIT/ML IJ SOLN: 6.0000 [IU] | Freq: Three times a day (TID) | INTRAMUSCULAR | Status: DC

## 2021-02-20 MED ORDER — INSULIN GLARGINE-YFGN 100 UNIT/ML ~~LOC~~ SOLN
35.0000 [IU] | Freq: Two times a day (BID) | SUBCUTANEOUS | Status: DC
  Administered 2021-02-20: 35 [IU] via SUBCUTANEOUS
  Filled 2021-02-20 (×4): qty 0.35

## 2021-02-20 NOTE — TOC Progression Note (Addendum)
Transition of Care [facility]) - Progression Note    Patient Details  Name: Jaclyn [name] MRN: [id number] Date of Birth: [birth year]  Transition of Care [facility]) CM/SW Brent, LCSW Phone Number: 02/20/2021, 4:28 PM  Clinical Narrative:    CSW initiated insurance approval process for [employer] (Ref# [id number]) and requested COVID test.    Expected Discharge Plan: Skilled Nursing Facility Barriers to Discharge: Continued Medical Work up, SNF Pending bed offer, Ship broker  Expected Discharge Plan and Services Expected Discharge Plan: [place] In-house Referral: [facility], Clinical Social Work Discharge Planning Services: [employer] Consult Post Acute Care Choice: [place] Living arrangements for the past 2 months: Single Family Home                                       Social Determinants of Health (SDOH) Interventions    Readmission Risk Interventions No flowsheet data found.

## 2021-02-20 NOTE — Progress Notes (Addendum)
PROGRESS NOTE    Jaclyn [name]  [id number]  DOB: [birth year]  DOA: 02/15/2021 PCP: Jinny Sanders, MD Outpatient Specialists:   Hospital course:  86 year old female with atrial fibrillation on Eliquis, fibromyalgia, HTN, poorly controlled DM was admitted 02/16/2021 with subacute worsening of functional status/FTT.  She had been having urinary incontinence, increased confusion much worse than baseline mild dementia, decreased p.o. intake and on ability to care for herself.  Work-up in the ED was notable for uncontrolled hypothyroidism, marked hyperglycemia and UTI.  Subjective:  Patient in bed, appears comfortable, denies any headache, no fever, no chest pain or pressure, no shortness of breath , no abdominal pain. No new focal weakness.   Objective: Vitals:   02/20/21 0130 02/20/21 0335 02/20/21 0735 02/20/21 1140  BP: 137/67 134/60 113/65 109/63  Pulse: 87 80 86   Resp: 16 17 15 18   Temp: 97.7 F (36.5 C) 98 F (36.7 C) 97.6 F (36.4 C) 98.1 F (36.7 C)  TempSrc: Oral Axillary Axillary Axillary  SpO2: 95% 96% 97% 97%  Weight:      Height:        Intake/Output Summary (Last 24 hours) at 02/20/2021 1352 Last data filed at 02/20/2021 0400 Gross per 24 hour  Intake 501.66 ml  Output --  Net 501.66 ml   Filed Weights   02/15/21 1732  Weight: 68.5 kg     Exam:  Awake Alert x1, No new F.N deficits, Normal affect [place].AT,PERRAL Supple Neck, No JVD,   Symmetrical Chest wall movement, Good air movement bilaterally, CTAB RRR,No Gallops, Rubs or new Murmurs,  +ve B.Sounds, Abd Soft, No tenderness,   No Cyanosis, Clubbing or edema    Assessment & Plan:   Language abnormality seen late last night--see rapid response note This morning patient's neuro exam is normal Language and comprehension entirely intact CT and MRI brain nonacute.  This was likely due to delirium.  Monitor with supportive care.  Already on Eliquis which will be continued.  Functional  decline/FTT Patient seems to be clinically improving with thyroid replacement and treatment of UTI. Likely multifactorial from UTI, uncontrolled hypothyroidism, decreased p.o. intake and dehydration secondary to marked hyperglycemia on top of baseline dementia. There were some consideration of NPH given some ataxia however she has many other reasons to have functional decline and we can consider diagnostic work-up of NPH if warranted after acute issues have been addressed.  Appreciate PT and OT consultation Appreciate palliative care consultation regarding goals for care [facility] consulted for SNF placement as son does not feel he can take care of patient at home anymore.  Hypothyroidism TSH is 22, free T4 is 0.55 Patient has not been taking her Synthroid She was started on IV Synthroid x3 days yesterday Restart oral Synthroid once she has completed her IV course of treatment  UTI Continue Rocephin x 7 days for complicated UTI started 98/92/1194 Myrbetriq was added for incontinence  Disposition Patient will need to be discharged to a SNF, TOC aware Goals for care also being actively discussed via palliative care consultation  Paroxysmal A. fib.  Mali vas 2 score of greater than 4.  Continue Eliquis.  Not on any rate controlling agents.  Uncontrolled DM2 - on Lantus and sliding scale.  Fasting glucose on the lower side, will drop the Lantus and nighttime sliding scale dose.  Monitor closely, with improving TSH and free T4 levels insulin requirements will go down.  Poor outpatient control was likely due to poorly controlled hypothyroidism  Lab  Results  Component Value Date   HGBA1C 14.4 (H) 12/25/2020   CBG (last 3)  Recent Labs    02/19/21 2108 02/20/21 0738 02/20/21 1147  GLUCAP 111* 85 76      DVT prophylaxis: Eliquis Code Status: Full Family Communication: None present Disposition Plan:   Patient is from: Home  Anticipated Discharge Location: SNF  Barriers to Discharge:  Subacute confusion  Is patient medically stable for Discharge: No   Scheduled Meds:  apixaban  5 mg Oral BID   docusate sodium  100 mg Oral BID   DULoxetine  60 mg Oral Daily   feeding supplement (GLUCERNA SHAKE)  237 mL Oral TID BM   folic acid  1 mg Oral Daily   insulin aspart  0-15 Units Subcutaneous TID WC   insulin aspart  0-5 Units Subcutaneous QHS   insulin aspart  8 Units Subcutaneous TID WC   insulin glargine-yfgn  38 Units Subcutaneous BID   levothyroxine  62.5 mcg Intravenous Daily   mirabegron ER  25 mg Oral Daily   multivitamin with minerals  1 tablet Oral Daily   polyethylene glycol  17 g Oral Daily   thiamine injection  100 mg Intravenous Daily   Vitamin D (Ergocalciferol)  50,000 Units Oral Q7 days   Continuous Infusions:  cefTRIAXone (ROCEPHIN)  IV 1 g (02/20/21 0521)   lactated ringers 75 mL/hr at 02/20/21 1200    Data Reviewed:  Basic Metabolic Panel: Recent Labs  Lab 02/15/21 2046 02/15/21 2056 02/17/21 0533  NA 130* 134* 134*  K 3.9 4.1 3.5  CL 95*  --  101  CO2 22  --  23  GLUCOSE 356*  --  184*  BUN 11  --  10  CREATININE 0.90  --  0.62  CALCIUM 8.8*  --  8.5*    CBC: Recent Labs  Lab 02/15/21 2046 02/15/21 2056 02/17/21 0533  WBC 6.4  --  6.3  NEUTROABS 4.5  --   --   HGB 12.6 11.9* 11.1*  HCT 35.2* 35.0* 31.4*  MCV 92.6  --  92.9  PLT 247  --  220    Studies: CT HEAD WO CONTRAST (5MM)  Result Date: 02/19/2021 CLINICAL DATA:  Altered mental status. EXAM: CT HEAD WITHOUT CONTRAST TECHNIQUE: Contiguous axial images were obtained from the base of the skull through the vertex without intravenous contrast. COMPARISON:  February 15, 2021 FINDINGS: Brain: There is moderate severity cerebral atrophy with widening of the extra-axial spaces and ventricular dilatation. There are areas of decreased attenuation within the white matter tracts of the supratentorial brain, consistent with microvascular disease changes. There is bilateral,  predominant symmetric basal ganglia calcification. Vascular: No hyperdense vessels are identified. Skull: Normal. Negative for fracture or focal lesion. Sinuses/Orbits: No acute finding. Other: None. IMPRESSION: 1. Generalized cerebral atrophy. 2. No acute intracranial abnormality. Electronically Signed   By: Virgina Norfolk M.D.   On: 02/19/2021 00:55   MR BRAIN WO CONTRAST  Result Date: 02/20/2021 CLINICAL DATA:  TIA, altered mental status EXAM: MRI HEAD WITHOUT CONTRAST TECHNIQUE: Multiplanar, multiecho pulse sequences of the brain and surrounding structures were obtained without intravenous contrast. COMPARISON:  July 2021 FINDINGS: Motion artifact is present. Brain: There is no acute infarction or intracranial hemorrhage. There is no intracranial mass, mass effect, or edema. There is no hydrocephalus or extra-axial fluid collection. Prominence of the ventricles and sulci reflects similar parenchymal volume loss. Patchy T2 hyperintensity in the supratentorial white matter probably reflects similar chronic microvascular  ischemic changes. Vascular: Major vessel flow voids at the skull base are preserved. Skull and upper cervical spine: Normal marrow signal is preserved. Sinuses/Orbits: Paranasal sinus mucosal thickening. Left lens replacement. Other: Sella is unremarkable.  Mastoid air cells are clear. IMPRESSION: Motion degraded.  No acute infarction. Electronically Signed   By: Macy Mis M.D.   On: 02/20/2021 11:31    Principal Problem:   Failure to thrive in adult Active Problems:   Hypothyroidism   Poorly controlled type 2 diabetes mellitus with complication ([facility])   PAROXYSMAL ATRIAL FIBRILLATION   Dyslipidemia   UTI (urinary tract infection)   Incontinence   Ataxia   Signature  Lala Lund M.D on 02/20/2021 at 1:53 PM   -  To page go to [email or web address]        LOS: 4 days

## 2021-02-21 DIAGNOSIS — R627 Adult failure to thrive: Secondary | ICD-10-CM | POA: Diagnosis not present

## 2021-02-21 LAB — GLUCOSE, CAPILLARY
Glucose-Capillary: 235 mg/dL — ABNORMAL HIGH (ref 70–99)
Glucose-Capillary: 131 mg/dL — ABNORMAL HIGH (ref 70–99)
Glucose-Capillary: 63 mg/dL — ABNORMAL LOW (ref 70–99)
Glucose-Capillary: 102 mg/dL — ABNORMAL HIGH (ref 70–99)
Glucose-Capillary: 160 mg/dL — ABNORMAL HIGH (ref 70–99)

## 2021-02-21 LAB — CBC
HCT: 29.1 % — ABNORMAL LOW (ref 36.0–46.0)
Hemoglobin: 9.9 g/dL — ABNORMAL LOW (ref 12.0–15.0)
MCH: 31.8 pg (ref 26.0–34.0)
MCHC: 34 g/dL (ref 30.0–36.0)
MCV: 93.6 fL (ref 80.0–100.0)
Platelets: 203 10*3/uL (ref 150–400)
RBC: 3.11 MIL/uL — ABNORMAL LOW (ref 3.87–5.11)
RDW: 14.4 % (ref 11.5–15.5)
WBC: 6 10*3/uL (ref 4.0–10.5)
nRBC: 0 % (ref 0.0–0.2)

## 2021-02-21 LAB — COMPREHENSIVE METABOLIC PANEL
ALT: 19 U/L (ref 0–44)
AST: 29 U/L (ref 15–41)
Albumin: 2.4 g/dL — ABNORMAL LOW (ref 3.5–5.0)
Alkaline Phosphatase: 54 U/L (ref 38–126)
Anion gap: 11 (ref 5–15)
BUN: 10 mg/dL (ref 8–23)
CO2: 24 mmol/L (ref 22–32)
Calcium: 8.3 mg/dL — ABNORMAL LOW (ref 8.9–10.3)
Chloride: 97 mmol/L — ABNORMAL LOW (ref 98–111)
Creatinine, Ser: 0.74 mg/dL (ref 0.44–1.00)
GFR, Estimated: >60 mL/min (ref >60)
Glucose, Bld: 368 mg/dL — ABNORMAL HIGH (ref 70–99)
Potassium: 3.6 mmol/L (ref 3.5–5.1)
Sodium: 132 mmol/L — ABNORMAL LOW (ref 135–145)
Total Bilirubin: 0.7 mg/dL (ref 0.3–1.2)
Total Protein: 5.6 g/dL — ABNORMAL LOW (ref 6.5–8.1)

## 2021-02-21 LAB — RESP PANEL BY RT-PCR (FLU A&B, COVID) ARPGX2
Influenza A by PCR: NEGATIVE
Influenza B by PCR: NEGATIVE
SARS Coronavirus 2 by RT PCR: NEGATIVE

## 2021-02-21 LAB — TSH: TSH: 19.45 u[IU]/mL — ABNORMAL HIGH (ref 0.350–4.500)

## 2021-02-21 LAB — MAGNESIUM: Magnesium: 1.4 mg/dL — ABNORMAL LOW (ref 1.7–2.4)

## 2021-02-21 MED ORDER — LACTATED RINGERS IV SOLN: INTRAVENOUS | Status: AC

## 2021-02-21 MED ORDER — MAGNESIUM SULFATE 2 GM/50ML IV SOLN
2.0000 g | Freq: Once | INTRAVENOUS | Status: AC
  Administered 2021-02-21: 2 g via INTRAVENOUS
  Filled 2021-02-21: qty 50

## 2021-02-21 MED ORDER — INSULIN ASPART 100 UNIT/ML IJ SOLN
0.0000 [IU] | Freq: Three times a day (TID) | INTRAMUSCULAR | Status: DC
  Administered 2021-02-21: 2 [IU] via SUBCUTANEOUS
  Administered 2021-02-21: 5 [IU] via SUBCUTANEOUS
  Administered 2021-02-23: 8 [IU] via SUBCUTANEOUS
  Administered 2021-02-23: 2 [IU] via SUBCUTANEOUS
  Administered 2021-02-24 – 2021-02-25 (×4): 3 [IU] via SUBCUTANEOUS

## 2021-02-21 MED ORDER — INSULIN ASPART 100 UNIT/ML IJ SOLN
0.0000 [IU] | Freq: Every day | INTRAMUSCULAR | Status: DC
  Administered 2021-02-22: 4 [IU] via SUBCUTANEOUS
  Administered 2021-02-23: 3 [IU] via SUBCUTANEOUS

## 2021-02-21 MED ORDER — PROSOURCE PLUS PO LIQD
30.0000 mL | Freq: Two times a day (BID) | ORAL | Status: DC
  Administered 2021-02-22 – 2021-02-25 (×5): 30 mL via ORAL
  Filled 2021-02-21 (×5): qty 30

## 2021-02-21 MED ORDER — INSULIN GLARGINE-YFGN 100 UNIT/ML ~~LOC~~ SOLN
40.0000 [IU] | Freq: Two times a day (BID) | SUBCUTANEOUS | Status: DC
  Administered 2021-02-21 – 2021-02-22 (×3): 40 [IU] via SUBCUTANEOUS
  Filled 2021-02-21 (×4): qty 0.4

## 2021-02-21 NOTE — Progress Notes (Signed)
PROGRESS NOTE    Jaclyn [name]  OZD:[id number]  DOB: Jan 25, 1936  DOA: 02/15/2021 PCP: Jinny Sanders, MD Outpatient Specialists:   Hospital course:  86 year old female with atrial fibrillation on Eliquis, fibromyalgia, HTN, poorly controlled DM was admitted 02/16/2021 with subacute worsening of functional status/FTT.  She had been having urinary incontinence, increased confusion much worse than baseline mild dementia, decreased p.o. intake and on ability to care for herself.  Work-up in the ED was notable for uncontrolled hypothyroidism, marked hyperglycemia and UTI.  Subjective:  Patient in bed, appears comfortable, denies any headache, no fever, no chest pain or pressure, no shortness of breath , no abdominal pain. No new focal weakness.    Objective: Vitals:   02/20/21 2159 02/21/21 0017 02/21/21 0400 02/21/21 0748  BP: (!) 92/52 96/84 140/72 (!) 126/58  Pulse: 81 91 94 90  Resp: 19 19 19 17   Temp: 98.5 F (36.9 C) 98.9 F (37.2 C) (!) 97 F (36.1 C) 98.8 F (37.1 C)  TempSrc: Oral Axillary Axillary Oral  SpO2: 96% 98% 94% 97%  Weight:   63.4 kg   Height:        Intake/Output Summary (Last 24 hours) at 02/21/2021 1147 Last data filed at 02/20/2021 1522 Gross per 24 hour  Intake 944.25 ml  Output --  Net 944.25 ml   Filed Weights   02/15/21 1732 02/21/21 0400  Weight: 68.5 kg 63.4 kg     Exam:  Awake Alert x1, No new F.N deficits,   [place].AT,PERRAL Supple Neck, No JVD,   Symmetrical Chest wall movement, Good air movement bilaterally, CTAB RRR,No Gallops, Rubs or new Murmurs,  +ve B.Sounds, Abd Soft, No tenderness,   No Cyanosis, Clubbing or edema     Assessment & Plan:   Language abnormality seen late last night--see rapid response note This morning patient's neuro exam is normal Language and comprehension entirely intact CT and MRI brain nonacute.  This was likely due to delirium.  Monitor with supportive care.  Already on Eliquis which will be  continued.  Functional decline/FTT Patient seems to be clinically improving with thyroid replacement and treatment of UTI. Likely multifactorial from UTI, uncontrolled hypothyroidism, decreased p.o. intake and dehydration secondary to marked hyperglycemia on top of baseline dementia. There were some consideration of NPH given some ataxia however she has many other reasons to have functional decline and we can consider diagnostic work-up of NPH if warranted after acute issues have been addressed.  Appreciate PT and OT consultation Appreciate palliative care consultation regarding goals for care [facility] consulted for SNF placement as son does not feel he can take care of patient at home anymore.  Hypothyroidism TSH is 22, free T4 is 0.55 Patient has not been taking her Synthroid She was started on IV Synthroid - continue for now   UTI Continue Rocephin x 7 days for complicated UTI started 25/95/6387 Myrbetriq was added for incontinence  Disposition Patient will need to be discharged to a SNF, TOC aware Goals for care also being actively discussed via palliative care consultation  Paroxysmal A. fib.  Mali vas 2 score of greater than 4.  Continue Eliquis.  Not on any rate controlling agents.  Uncontrolled DM2 - on Lantus, Premeal NovoLog and sliding scale, dose adjusted further on 02/21/2021, monitor closely, with improving TSH and free T4 levels insulin requirements will go down.  Poor outpatient control was likely due to poorly controlled hypothyroidism  Lab Results  Component Value Date   HGBA1C 14.4 (H)  12/25/2020   CBG (last 3)  Recent Labs    02/20/21 1543 02/20/21 2142 02/21/21 0751  GLUCAP 98 319* 235*      DVT prophylaxis: Eliquis Code Status: Full Family Communication: None present Disposition Plan:   Patient is from: Home  Anticipated Discharge Location: SNF  Barriers to Discharge: Subacute confusion  Is patient medically stable for Discharge: No   Scheduled  Meds:  (feeding supplement) PROSource Plus  30 mL Oral BID BM   apixaban  5 mg Oral BID   docusate sodium  100 mg Oral BID   DULoxetine  60 mg Oral Daily   folic acid  1 mg Oral Daily   insulin aspart  0-15 Units Subcutaneous TID WC   insulin aspart  0-5 Units Subcutaneous QHS   insulin aspart  6 Units Subcutaneous TID WC   insulin glargine-yfgn  40 Units Subcutaneous BID   levothyroxine  62.5 mcg Intravenous Daily   mirabegron ER  25 mg Oral Daily   multivitamin with minerals  1 tablet Oral Daily   polyethylene glycol  17 g Oral Daily   thiamine injection  100 mg Intravenous Daily   Vitamin D (Ergocalciferol)  50,000 Units Oral Q7 days   Continuous Infusions:  cefTRIAXone (ROCEPHIN)  IV 1 g (02/21/21 0553)   lactated ringers     magnesium sulfate bolus IVPB      Data Reviewed:  Basic Metabolic Panel: Recent Labs  Lab 02/15/21 2046 02/15/21 2056 02/17/21 0533 02/21/21 0114  NA 130* 134* 134* 132*  K 3.9 4.1 3.5 3.6  CL 95*  --  101 97*  CO2 22  --  23 24  GLUCOSE 356*  --  184* 368*  BUN 11  --  10 10  CREATININE 0.90  --  0.62 0.74  CALCIUM 8.8*  --  8.5* 8.3*  MG  --   --   --  1.4*    CBC: Recent Labs  Lab 02/15/21 2046 02/15/21 2056 02/17/21 0533 02/21/21 0114  WBC 6.4  --  6.3 6.0  NEUTROABS 4.5  --   --   --   HGB 12.6 11.9* 11.1* 9.9*  HCT 35.2* 35.0* 31.4* 29.1*  MCV 92.6  --  92.9 93.6  PLT 247  --  220 203    Studies: MR BRAIN WO CONTRAST  Result Date: 02/20/2021 CLINICAL DATA:  TIA, altered mental status EXAM: MRI HEAD WITHOUT CONTRAST TECHNIQUE: Multiplanar, multiecho pulse sequences of the brain and surrounding structures were obtained without intravenous contrast. COMPARISON:  July 2021 FINDINGS: Motion artifact is present. Brain: There is no acute infarction or intracranial hemorrhage. There is no intracranial mass, mass effect, or edema. There is no hydrocephalus or extra-axial fluid collection. Prominence of the ventricles and sulci  reflects similar parenchymal volume loss. Patchy T2 hyperintensity in the supratentorial white matter probably reflects similar chronic microvascular ischemic changes. Vascular: Major vessel flow voids at the skull base are preserved. Skull and upper cervical spine: Normal marrow signal is preserved. Sinuses/Orbits: Paranasal sinus mucosal thickening. Left lens replacement. Other: Sella is unremarkable.  Mastoid air cells are clear. IMPRESSION: Motion degraded.  No acute infarction. Electronically Signed   By: Macy Mis M.D.   On: 02/20/2021 11:31    Principal Problem:   Failure to thrive in adult Active Problems:   Hypothyroidism   Poorly controlled type 2 diabetes mellitus with complication ([place])   PAROXYSMAL ATRIAL FIBRILLATION   Dyslipidemia   UTI (urinary tract infection)   Incontinence  Ataxia   Signature  Lala Lund M.D on 02/21/2021 at 11:47 AM   -  To page go to [email or web address]        LOS: 5 days

## 2021-02-21 NOTE — Progress Notes (Signed)
Physical Therapy Treatment Patient Details Name: Jaclyn [name] MRN: [id number] DOB: Jan 07, 1936 Today's Date: 02/21/2021   History of Present Illness Pt presented to ED on 12/30 with failure to thrive. PMH includes COVID, CVA, dementia, HTN, hypothyroidism, recurrent UTI, arthritis.    PT Comments    Pt demonstrates difficulty following commands this session. PT is unsure if pt is unable to do so due to not hearing commands initially during session, or due to cognitive deficits. Pt also unable to follow visual or tactile cues well when mobilizing early in session. Pt continues to require significant physical assistance for all mobility and demonstrates impaired initiation. Pt will benefit from continued attempts at mobilizing to aide in reducing caregiver burden and falls risk. PT opens blinds during session to aide in allowing sunlight into room as nurse tech reports the pt has been confused to time of day.   Recommendations for follow up therapy are one component of a multi-disciplinary discharge planning process, led by the attending physician.  Recommendations may be updated based on patient status, additional functional criteria and insurance authorization.  Follow Up Recommendations  Skilled nursing-short term rehab (<3 hours/day)     Assistance Recommended at Discharge Frequent or constant Supervision/Assistance  Patient can return home with the following Two people to help with walking and/or transfers;Two people to help with bathing/dressing/bathroom;Assistance with feeding   Equipment Recommendations  Wheelchair (measurements PT);Wheelchair cushion (measurements PT) (hoyer lift)    Recommendations for Other Services       Precautions / Restrictions Precautions Precautions: Fall Restrictions Weight Bearing Restrictions: No     Mobility  Bed Mobility Overal bed mobility: Needs Assistance Bed Mobility: Supine to Sit;Sit to Supine     Supine to sit: Total assist;HOB  elevated Sit to supine: Mod assist;HOB elevated   General bed mobility comments: pt does not initiate supine to sit, requires totalA from PT. Pt initiates return to supine, requires assistance for LE management    Transfers Overall transfer level: Needs assistance Equipment used: 1 person hand held assist Transfers: Sit to/from Stand Sit to Stand: Total assist           General transfer comment: unable to clear buttocks from bed, pt with knees flexed and feet under bed during attempt. Pt unable to follow commands reliably enough to safely attempt again    Ambulation/Gait                   Stairs             Wheelchair Mobility    Modified Rankin (Stroke Patients Only)       Balance Overall balance assessment: Needs assistance Sitting-balance support: Single extremity supported;Feet supported Sitting balance-Leahy Scale: Poor Sitting balance - Comments: leftward lateral lean, anterior lean Postural control: Left lateral lean                                  Cognition Arousal/Alertness: Lethargic;Awake/alert (lethargic initially, awakens with PT initiated mobility) Behavior During Therapy: Flat affect Overall Cognitive Status: History of cognitive impairments - at baseline Area of Impairment: Orientation;Attention;Memory;Following commands;Safety/judgement;Awareness;Problem solving                 Orientation Level: Disoriented to;Person;Place;Time;Situation (pt reports name but not birth year) Current Attention Level: Focused Memory: Decreased recall of precautions;Decreased short-term memory Following Commands: Follows one step commands inconsistently Safety/Judgement: Decreased awareness of safety;Decreased awareness of deficits Awareness: Intellectual Problem Solving:  Slow processing;Decreased initiation;Requires verbal cues;Requires tactile cues          Exercises      General Comments General comments (skin integrity,  edema, etc.): VSS on RA. Pt not responding to PT questions initially during session. Upon completion of mobility pt reports name and responds verbally when PT speaks with greatly increased volume. Possible not following commands initially due to being very hard of hearing, but also seems to be cognitively affected as pt did not follow visual or tactile cues well.      Pertinent Vitals/Pain Pain Assessment: Faces Faces Pain Scale: No hurt    Home Living                          Prior Function            PT Goals (current goals can now be found in the care plan section) Acute Rehab PT Goals Patient Stated Goal: Son hopes pt can become more mobile with treatment Progress towards PT goals: Not progressing toward goals - comment (poor command following)    Frequency    Min 2X/week      PT Plan Current plan remains appropriate    Co-evaluation              AM-PAC PT "6 Clicks" Mobility   Outcome Measure  Help needed turning from your back to your side while in a flat bed without using bedrails?: A Little Help needed moving from lying on your back to sitting on the side of a flat bed without using bedrails?: Total Help needed moving to and from a bed to a chair (including a wheelchair)?: Total Help needed standing up from a chair using your arms (e.g., wheelchair or bedside chair)?: Total Help needed to walk in hospital room?: Total Help needed climbing 3-5 steps with a railing? : Total 6 Click Score: 8    End of Session   Activity Tolerance: Other (comment) (limited by poor command following) Patient left: in bed;with bed alarm set;with call bell/phone within reach Nurse Communication: Mobility status PT Visit Diagnosis: Other abnormalities of gait and mobility (R26.89);Muscle weakness (generalized) (M62.81);Adult, failure to thrive (R62.7)     Time: 7121-9758 PT Time Calculation (min) (ACUTE ONLY): 19 min  Charges:  $Therapeutic Activity: 8-22  mins                     Zenaida Niece, PT, DPT Acute Rehabilitation Pager: [phone] Office [phone]    Zenaida Niece 02/21/2021, 5:05 PM

## 2021-02-21 NOTE — TOC Progression Note (Signed)
Transition of Care [employer]) - Progression Note    Patient Details  Name: Jaclyn [name] MRN: [id number] Date of Birth: 05-03-1935  Transition of Care [facility]) CM/SW [place], LCSW Phone Number: 02/21/2021, 11:52 AM  Clinical Narrative:    Insurance approval received for [facility] rehab until 02/25/21. [place] aware patient is not medically ready today.    Expected Discharge Plan: Skilled Nursing Facility Barriers to Discharge: Continued Medical Work up, SNF Pending bed offer, Ship broker  Expected Discharge Plan and Services Expected Discharge Plan: [place] In-house Referral: [facility], Clinical Social Work Discharge Planning Services: CM Consult Post Acute Care Choice: [place] Living arrangements for the past 2 months: Single Family Home                                       Social Determinants of Health (SDOH) Interventions    Readmission Risk Interventions No flowsheet data found.

## 2021-02-22 DIAGNOSIS — R627 Adult failure to thrive: Secondary | ICD-10-CM | POA: Diagnosis not present

## 2021-02-22 LAB — BASIC METABOLIC PANEL
Anion gap: 8 (ref 5–15)
BUN: 7 mg/dL — ABNORMAL LOW (ref 8–23)
CO2: 26 mmol/L (ref 22–32)
Calcium: 8.2 mg/dL — ABNORMAL LOW (ref 8.9–10.3)
Chloride: 100 mmol/L (ref 98–111)
Creatinine, Ser: 0.64 mg/dL (ref 0.44–1.00)
GFR, Estimated: >60 mL/min (ref >60)
Glucose, Bld: 118 mg/dL — ABNORMAL HIGH (ref 70–99)
Potassium: 3.5 mmol/L (ref 3.5–5.1)
Sodium: 134 mmol/L — ABNORMAL LOW (ref 135–145)

## 2021-02-22 LAB — CBC WITH DIFFERENTIAL/PLATELET
Abs Immature Granulocytes: 0.02 10*3/uL (ref 0.00–0.07)
Basophils Absolute: 0 10*3/uL (ref 0.0–0.1)
Basophils Relative: 0 %
Eosinophils Absolute: 0.1 10*3/uL (ref 0.0–0.5)
Eosinophils Relative: 1 %
HCT: 28.6 % — ABNORMAL LOW (ref 36.0–46.0)
Hemoglobin: 9.9 g/dL — ABNORMAL LOW (ref 12.0–15.0)
Immature Granulocytes: 0 %
Lymphocytes Relative: 18 %
Lymphs Abs: 1.1 10*3/uL (ref 0.7–4.0)
MCH: 32.5 pg (ref 26.0–34.0)
MCHC: 34.6 g/dL (ref 30.0–36.0)
MCV: 93.8 fL (ref 80.0–100.0)
Monocytes Absolute: 0.9 10*3/uL (ref 0.1–1.0)
Monocytes Relative: 14 %
Neutro Abs: 4.2 10*3/uL (ref 1.7–7.7)
Neutrophils Relative %: 67 %
Platelets: 231 10*3/uL (ref 150–400)
RBC: 3.05 MIL/uL — ABNORMAL LOW (ref 3.87–5.11)
RDW: 14.4 % (ref 11.5–15.5)
WBC: 6.3 10*3/uL (ref 4.0–10.5)
nRBC: 0 % (ref 0.0–0.2)

## 2021-02-22 LAB — VITAMIN B1: Vitamin B1 (Thiamine): 119.2 nmol/L (ref 66.5–200.0)

## 2021-02-22 LAB — GLUCOSE, CAPILLARY
Glucose-Capillary: 71 mg/dL (ref 70–99)
Glucose-Capillary: 73 mg/dL (ref 70–99)
Glucose-Capillary: 99 mg/dL (ref 70–99)
Glucose-Capillary: 304 mg/dL — ABNORMAL HIGH (ref 70–99)

## 2021-02-22 LAB — MAGNESIUM: Magnesium: 1.9 mg/dL (ref 1.7–2.4)

## 2021-02-22 MED ORDER — INSULIN ASPART 100 UNIT/ML IJ SOLN: 5.0000 [IU] | Freq: Three times a day (TID) | INTRAMUSCULAR | Status: DC

## 2021-02-22 MED ORDER — INSULIN ASPART 100 UNIT/ML IJ SOLN
3.0000 [IU] | Freq: Three times a day (TID) | INTRAMUSCULAR | Status: DC
  Administered 2021-02-23 – 2021-02-25 (×4): 3 [IU] via SUBCUTANEOUS

## 2021-02-22 MED ORDER — INSULIN GLARGINE-YFGN 100 UNIT/ML ~~LOC~~ SOLN
38.0000 [IU] | Freq: Two times a day (BID) | SUBCUTANEOUS | Status: DC
  Administered 2021-02-22 – 2021-02-25 (×6): 38 [IU] via SUBCUTANEOUS
  Filled 2021-02-22 (×8): qty 0.38

## 2021-02-22 NOTE — Plan of Care (Signed)
  Problem: Education: Goal: Knowledge of General Education information will improve Description Including pain rating scale, medication(s)/side effects and non-pharmacologic comfort measures Outcome: Progressing   Problem: Health Behavior/Discharge Planning: Goal: Ability to manage health-related needs will improve Outcome: Progressing   

## 2021-02-22 NOTE — Progress Notes (Signed)
PROGRESS NOTE    Jaclyn [name]  XBL:[id number]  DOB: 30-Jul-1935  DOA: 02/15/2021 PCP: Jinny Sanders, MD Outpatient Specialists:   Hospital course:  86 year old female with atrial fibrillation on Eliquis, fibromyalgia, HTN, poorly controlled DM was admitted 02/16/2021 with subacute worsening of functional status/FTT.  She had been having urinary incontinence, increased confusion much worse than baseline mild dementia, decreased p.o. intake and on ability to care for herself.  Work-up in the ED was notable for uncontrolled hypothyroidism, marked hyperglycemia and UTI.  Subjective:  Patient in bed, appears comfortable, denies any headache, no fever, no chest pain or pressure, no shortness of breath , no abdominal pain. No new focal weakness.     Objective: Vitals:   02/21/21 2202 02/22/21 0000 02/22/21 0400 02/22/21 0752  BP: (!) 147/68 (!) 165/84 129/61 138/79  Pulse: 85   76  Resp: 17 17 18 16   Temp: 98.1 F (36.7 C) 98.2 F (36.8 C) 97.8 F (36.6 C) 97.9 F (36.6 C)  TempSrc: Oral Oral Oral Oral  SpO2: 100%  94% 98%  Weight:      Height:        Intake/Output Summary (Last 24 hours) at 02/22/2021 1044 Last data filed at 02/21/2021 2210 Gross per 24 hour  Intake 1246.42 ml  Output --  Net 1246.42 ml   Filed Weights   02/15/21 1732 02/21/21 0400  Weight: 68.5 kg 63.4 kg     Exam:  Awake Alert x1,  No new F.N deficits,  [place].AT,PERRAL Supple Neck, No JVD,   Symmetrical Chest wall movement, Good air movement bilaterally, CTAB RRR,No Gallops, Rubs or new Murmurs,  +ve B.Sounds, Abd Soft, No tenderness,   No Cyanosis, Clubbing or edema      Assessment & Plan:   Language abnormality - happened in the hospital on the day of admission, likely due to encephalopathy, MRI unremarkable no focal deficits.  Continue to monitor on Eliquis for A. fib already which will be continued.  Functional decline/FTT with metabolic and toxic encephalopathy-  Patient seems to  have combination of severe hypothyroidism with TSH greater than 20, UTI, underlying dementia along with hospital-acquired delirium.  MRI here unremarkable, no focal deficits.  Stable on nighttime Seroquel.  Continue PT OT and eventual SNF discharge.    Hypothyroidism - TSH was 22, improving on IV Synthroid.  Monitor.  UTI - has finished antibiotic course, continue Myrbetriq was added for incontinence  Disposition - SNF  Paroxysmal A. fib.  Mali vas 2 score of greater than 4.  Continue Eliquis.  Not on any rate controlling agents.  Uncontrolled DM2 - on Lantus, Premeal NovoLog and sliding scale, dose adjusted again on 02/22/2021, rapidly changing insulin requirements with improving TSH and free T4 levels insulin requirements will go down.  Poor outpatient control was likely due to poorly controlled hypothyroidism  Lab Results  Component Value Date   HGBA1C 14.4 (H) 12/25/2020   CBG (last 3)  Recent Labs    02/21/21 1646 02/21/21 2200 02/22/21 0757  GLUCAP 102* 160* 71      DVT prophylaxis: Eliquis Code Status: Full Family Communication: None present Disposition Plan:   Patient is from: Home  Anticipated Discharge Location: SNF  Barriers to Discharge: Subacute confusion  Is patient medically stable for Discharge: No   Scheduled Meds:  (feeding supplement) PROSource Plus  30 mL Oral BID BM   apixaban  5 mg Oral BID   docusate sodium  100 mg Oral BID   DULoxetine  60 mg Oral Daily   folic acid  1 mg Oral Daily   insulin aspart  0-15 Units Subcutaneous TID WC   insulin aspart  0-5 Units Subcutaneous QHS   insulin aspart  3 Units Subcutaneous TID WC   insulin glargine-yfgn  38 Units Subcutaneous BID   levothyroxine  62.5 mcg Intravenous Daily   mirabegron ER  25 mg Oral Daily   multivitamin with minerals  1 tablet Oral Daily   polyethylene glycol  17 g Oral Daily   thiamine injection  100 mg Intravenous Daily   Vitamin D (Ergocalciferol)  50,000 Units Oral Q7 days    Continuous Infusions:    Data Reviewed:  Basic Metabolic Panel: Recent Labs  Lab 02/15/21 2046 02/15/21 2056 02/17/21 0533 02/21/21 0114 02/22/21 0114  NA 130* 134* 134* 132* 134*  K 3.9 4.1 3.5 3.6 3.5  CL 95*  --  101 97* 100  CO2 22  --  23 24 26   GLUCOSE 356*  --  184* 368* 118*  BUN 11  --  10 10 7*  CREATININE 0.90  --  0.62 0.74 0.64  CALCIUM 8.8*  --  8.5* 8.3* 8.2*  MG  --   --   --  1.4* 1.9    CBC: Recent Labs  Lab 02/15/21 2046 02/15/21 2056 02/17/21 0533 02/21/21 0114 02/22/21 0114  WBC 6.4  --  6.3 6.0 6.3  NEUTROABS 4.5  --   --   --  4.2  HGB 12.6 11.9* 11.1* 9.9* 9.9*  HCT 35.2* 35.0* 31.4* 29.1* 28.6*  MCV 92.6  --  92.9 93.6 93.8  PLT 247  --  220 203 231    Studies: MR BRAIN WO CONTRAST  Result Date: 02/20/2021 CLINICAL DATA:  TIA, altered mental status EXAM: MRI HEAD WITHOUT CONTRAST TECHNIQUE: Multiplanar, multiecho pulse sequences of the brain and surrounding structures were obtained without intravenous contrast. COMPARISON:  July 2021 FINDINGS: Motion artifact is present. Brain: There is no acute infarction or intracranial hemorrhage. There is no intracranial mass, mass effect, or edema. There is no hydrocephalus or extra-axial fluid collection. Prominence of the ventricles and sulci reflects similar parenchymal volume loss. Patchy T2 hyperintensity in the supratentorial white matter probably reflects similar chronic microvascular ischemic changes. Vascular: Major vessel flow voids at the skull base are preserved. Skull and upper cervical spine: Normal marrow signal is preserved. Sinuses/Orbits: Paranasal sinus mucosal thickening. Left lens replacement. Other: Sella is unremarkable.  Mastoid air cells are clear. IMPRESSION: Motion degraded.  No acute infarction. Electronically Signed   By: Macy Mis M.D.   On: 02/20/2021 11:31    Principal Problem:   Failure to thrive in adult Active Problems:   Hypothyroidism   Poorly controlled type  2 diabetes mellitus with complication (HCC)   PAROXYSMAL ATRIAL FIBRILLATION   Dyslipidemia   UTI (urinary tract infection)   Incontinence   Ataxia   Signature  Lala Lund M.D on 02/22/2021 at 10:44 AM   -  To page go to [email or web address]    LOS: 6 days

## 2021-02-22 NOTE — Progress Notes (Signed)
Inpatient Diabetes Program Recommendations  AACE/ADA: New Consensus Statement on Inpatient Glycemic Control   Target Ranges:  Prepandial:   less than 140 mg/dL      Peak postprandial:   less than 180 mg/dL (1-2 hours)      Critically ill patients:  140 - 180 mg/dL    Latest Reference Range & Units 02/21/21 07:51 02/21/21 12:19 02/21/21 15:51 02/21/21 16:46 02/21/21 22:00  Glucose-Capillary 70 - 99 mg/dL 235 (H) 131 (H) 63 (L) 102 (H) 160 (H)   Review of Glycemic Control  Diabetes history: DM Outpatient Diabetes medications: Lantus 35 units BID, Januvia 100 mg QD  Current orders for Inpatient glycemic control: Semglee 40 units BID,  Novolog 5 units TID with meals, Novlog 0-15 units TID with meals, Novolog 0-5 units QHS  Inpatient Diabetes Program Recommendations:    Insulin: Per chart patient did not eat breakfast or lunch and no Novolog meal coverage was given at all on 02/21/21. Glucose 63 mg/dl at 15:51. May want to consider decreasing Semglee slightly to 38 units BID and decreasing Novolog correction to sensitive scale (0-9 units TID with meals).  Thanks, Barnie Alderman, RN, MSN, CDE Diabetes Coordinator Inpatient Diabetes Program [phone] (Team Pager from 8am to 5pm)

## 2021-02-23 DIAGNOSIS — R627 Adult failure to thrive: Secondary | ICD-10-CM | POA: Diagnosis not present

## 2021-02-23 LAB — BASIC METABOLIC PANEL
Anion gap: 5 (ref 5–15)
BUN: 10 mg/dL (ref 8–23)
CO2: 27 mmol/L (ref 22–32)
Calcium: 8.5 mg/dL — ABNORMAL LOW (ref 8.9–10.3)
Chloride: 96 mmol/L — ABNORMAL LOW (ref 98–111)
Creatinine, Ser: 0.69 mg/dL (ref 0.44–1.00)
GFR, Estimated: >60 mL/min (ref >60)
Glucose, Bld: 298 mg/dL — ABNORMAL HIGH (ref 70–99)
Potassium: 3.8 mmol/L (ref 3.5–5.1)
Sodium: 128 mmol/L — ABNORMAL LOW (ref 135–145)

## 2021-02-23 LAB — CBC WITH DIFFERENTIAL/PLATELET
Abs Immature Granulocytes: 0.02 10*3/uL (ref 0.00–0.07)
Basophils Absolute: 0 10*3/uL (ref 0.0–0.1)
Basophils Relative: 0 %
Eosinophils Absolute: 0.1 10*3/uL (ref 0.0–0.5)
Eosinophils Relative: 1 %
HCT: 28.5 % — ABNORMAL LOW (ref 36.0–46.0)
Hemoglobin: 9.6 g/dL — ABNORMAL LOW (ref 12.0–15.0)
Immature Granulocytes: 0 %
Lymphocytes Relative: 23 %
Lymphs Abs: 1.3 10*3/uL (ref 0.7–4.0)
MCH: 31.6 pg (ref 26.0–34.0)
MCHC: 33.7 g/dL (ref 30.0–36.0)
MCV: 93.8 fL (ref 80.0–100.0)
Monocytes Absolute: 1 10*3/uL (ref 0.1–1.0)
Monocytes Relative: 17 %
Neutro Abs: 3.4 10*3/uL (ref 1.7–7.7)
Neutrophils Relative %: 59 %
Platelets: 250 10*3/uL (ref 150–400)
RBC: 3.04 MIL/uL — ABNORMAL LOW (ref 3.87–5.11)
RDW: 14.5 % (ref 11.5–15.5)
WBC: 5.8 10*3/uL (ref 4.0–10.5)
nRBC: 0 % (ref 0.0–0.2)

## 2021-02-23 LAB — GLUCOSE, CAPILLARY
Glucose-Capillary: 147 mg/dL — ABNORMAL HIGH (ref 70–99)
Glucose-Capillary: 116 mg/dL — ABNORMAL HIGH (ref 70–99)
Glucose-Capillary: 269 mg/dL — ABNORMAL HIGH (ref 70–99)
Glucose-Capillary: 288 mg/dL — ABNORMAL HIGH (ref 70–99)

## 2021-02-23 LAB — OSMOLALITY, URINE: Osmolality, Ur: 310 mOsm/kg (ref 300–900)

## 2021-02-23 LAB — TSH: TSH: 17.857 u[IU]/mL — ABNORMAL HIGH (ref 0.350–4.500)

## 2021-02-23 LAB — MAGNESIUM: Magnesium: 1.7 mg/dL (ref 1.7–2.4)

## 2021-02-23 LAB — OSMOLALITY: Osmolality: 287 mOsm/kg (ref 275–295)

## 2021-02-23 LAB — URIC ACID: Uric Acid, Serum: 2.3 mg/dL — ABNORMAL LOW (ref 2.5–7.1)

## 2021-02-23 LAB — SODIUM, URINE, RANDOM: Sodium, Ur: 86 mmol/L

## 2021-02-23 LAB — SODIUM: Sodium: 138 mmol/L (ref 135–145)

## 2021-02-23 MED ORDER — TOLVAPTAN 15 MG PO TABS
15.0000 mg | ORAL_TABLET | Freq: Once | ORAL | Status: AC
  Administered 2021-02-23: 15 mg via ORAL
  Filled 2021-02-23: qty 1

## 2021-02-23 NOTE — Progress Notes (Signed)
PROGRESS NOTE    Jaclyn [name]  EAV:[id number]  DOB: 1935/12/03  DOA: 02/15/2021 PCP: Jinny Sanders, MD Outpatient Specialists:   Hospital course:  86 year old female with atrial fibrillation on Eliquis, fibromyalgia, HTN, poorly controlled DM was admitted 02/16/2021 with subacute worsening of functional status/FTT.  She had been having urinary incontinence, increased confusion much worse than baseline mild dementia, decreased p.o. intake and on ability to care for herself.  Work-up in the ED was notable for uncontrolled hypothyroidism, marked hyperglycemia and UTI.  Subjective: Patient in bed appears to be in no distress denies any headache, overall confused unable to fully cooperate in exam.  Objective: Vitals:   02/22/21 2135 02/23/21 0000 02/23/21 0436 02/23/21 0749  BP: (!) 141/71 124/74 130/61 (!) 147/82  Pulse: 93 96 92 82  Resp: 18 16 16 16   Temp: 98.2 F (36.8 C)  98 F (36.7 C) 98.4 F (36.9 C)  TempSrc: Axillary  Axillary Axillary  SpO2: 93% 93% 95%   Weight:      Height:        Intake/Output Summary (Last 24 hours) at 02/23/2021 1116 Last data filed at 02/23/2021 1035 Gross per 24 hour  Intake 0 ml  Output 400 ml  Net -400 ml   Filed Weights   02/15/21 1732 02/21/21 0400  Weight: 68.5 kg 63.4 kg     Exam:  Awake but pleasantly confused, no focal deficits moving all 4 extremities by herself [place].AT,PERRAL Supple Neck, No JVD,   Symmetrical Chest wall movement, Good air movement bilaterally, CTAB RRR,No Gallops, Rubs or new Murmurs,  +ve B.Sounds, Abd Soft, No tenderness,   No Cyanosis, Clubbing or edema    Assessment & Plan:   Language abnormality - happened in the hospital on the day of admission, likely due to encephalopathy, MRI unremarkable no focal deficits.  Continue to monitor on Eliquis for A. fib already which will be continued.  Functional decline/FTT with metabolic and toxic encephalopathy-  Patient seems to have combination of severe  hypothyroidism with TSH greater than 20, UTI, underlying dementia along with hospital-acquired delirium.  MRI here unremarkable, no focal deficits.  Stable on nighttime Seroquel.  Continue PT OT and eventual SNF discharge.    Hypothyroidism - TSH was 22, improving on IV Synthroid.  Monitor.  UTI - has finished antibiotic course, continue Myrbetriq was added for incontinence  Disposition - SNF  Paroxysmal A. fib.  Mali vas 2 score of greater than 4.  Continue Eliquis.  Not on any rate controlling agents.  Hyponatremia.  SIADH much worse with IV fluids.  Trial of [place] and monitor , unable to provide urine specimen due to dementia.  Uncontrolled DM2 - on Lantus, Premeal NovoLog and sliding scale, dose adjusted again on 02/22/2021, rapidly changing insulin requirements with improving TSH and free T4 levels insulin requirements will go down.  Poor outpatient control was likely due to poorly controlled hypothyroidism  Lab Results  Component Value Date   HGBA1C 14.4 (H) 12/25/2020   CBG (last 3)  Recent Labs    02/22/21 1559 02/22/21 2130 02/23/21 0757  GLUCAP 99 304* 147*      DVT prophylaxis: Eliquis Code Status: Full Family Communication: None present Disposition Plan:   Patient is from: Home  Anticipated Discharge Location: SNF  Barriers to Discharge: Subacute confusion  Is patient medically stable for Discharge: No   Scheduled Meds:  (feeding supplement) PROSource Plus  30 mL Oral BID BM   apixaban  5 mg Oral BID  docusate sodium  100 mg Oral BID   DULoxetine  60 mg Oral Daily   folic acid  1 mg Oral Daily   insulin aspart  0-15 Units Subcutaneous TID WC   insulin aspart  0-5 Units Subcutaneous QHS   insulin aspart  3 Units Subcutaneous TID WC   insulin glargine-yfgn  38 Units Subcutaneous BID   levothyroxine  62.5 mcg Intravenous Daily   mirabegron ER  25 mg Oral Daily   multivitamin with minerals  1 tablet Oral Daily   polyethylene glycol  17 g Oral Daily    thiamine injection  100 mg Intravenous Daily   tolvaptan  15 mg Oral Once   Vitamin D (Ergocalciferol)  50,000 Units Oral Q7 days   Continuous Infusions:    Data Reviewed:  Basic Metabolic Panel: Recent Labs  Lab 02/17/21 0533 02/21/21 0114 02/22/21 0114 02/23/21 0044  NA 134* 132* 134* 128*  K 3.5 3.6 3.5 3.8  CL 101 97* 100 96*  CO2 23 24 26 27   GLUCOSE 184* 368* 118* 298*  BUN 10 10 7* 10  CREATININE 0.62 0.74 0.64 0.69  CALCIUM 8.5* 8.3* 8.2* 8.5*  MG  --  1.4* 1.9 1.7    CBC: Recent Labs  Lab 02/17/21 0533 02/21/21 0114 02/22/21 0114 02/23/21 0044  WBC 6.3 6.0 6.3 5.8  NEUTROABS  --   --  4.2 3.4  HGB 11.1* 9.9* 9.9* 9.6*  HCT 31.4* 29.1* 28.6* 28.5*  MCV 92.9 93.6 93.8 93.8  PLT 220 203 231 250    Studies: No results found.  Principal Problem:   Failure to thrive in adult Active Problems:   Hypothyroidism   Poorly controlled type 2 diabetes mellitus with complication (HCC)   PAROXYSMAL ATRIAL FIBRILLATION   Dyslipidemia   UTI (urinary tract infection)   Incontinence   Ataxia   Signature  Lala Lund M.D on 02/23/2021 at 11:16 AM   -  To page go to [email or web address]    LOS: 7 days

## 2021-02-23 NOTE — Plan of Care (Signed)
°  Problem: Education: Goal: Knowledge of General Education information will improve Description: Including pain rating scale, medication(s)/side effects and non-pharmacologic comfort measures Outcome: Progressing   Problem: Health Behavior/Discharge Planning: Goal: Ability to manage health-related needs will improve Outcome: Progressing   Problem: Clinical Measurements: Goal: Ability to maintain clinical measurements within normal limits will improve Outcome: Progressing Goal: Will remain free from infection Outcome: Progressing Goal: Diagnostic test results will improve Outcome: Progressing Goal: Respiratory complications will improve Outcome: Progressing Goal: Cardiovascular complication will be avoided Outcome: Progressing   Problem: Activity: Goal: Risk for activity intolerance will decrease Outcome: Progressing   Problem: Nutrition: Goal: Adequate nutrition will be maintained Outcome: Progressing   Problem: Coping: Goal: Level of anxiety will decrease Outcome: Progressing   Problem: Elimination: Goal: Will not experience complications related to bowel motility Outcome: Progressing Goal: Will not experience complications related to urinary retention Outcome: Progressing   Problem: Pain Managment: Goal: General experience of comfort will improve Outcome: Progressing   Problem: Safety: Goal: Ability to remain free from injury will improve Outcome: Progressing   Problem: Skin Integrity: Goal: Risk for impaired skin integrity will decrease Outcome: Progressing   Problem: Urinary Elimination: Goal: Signs and symptoms of infection will decrease Outcome: Progressing   Problem: Education: Goal: Knowledge of General Education information will improve Description: Including pain rating scale, medication(s)/side effects and non-pharmacologic comfort measures Outcome: Progressing   Problem: Health Behavior/Discharge Planning: Goal: Ability to manage health-related  needs will improve Outcome: Progressing   Problem: Clinical Measurements: Goal: Ability to maintain clinical measurements within normal limits will improve Outcome: Progressing Goal: Will remain free from infection Outcome: Progressing Goal: Diagnostic test results will improve Outcome: Progressing Goal: Respiratory complications will improve Outcome: Progressing Goal: Cardiovascular complication will be avoided Outcome: Progressing   Problem: Activity: Goal: Risk for activity intolerance will decrease Outcome: Progressing   Problem: Nutrition: Goal: Adequate nutrition will be maintained Outcome: Progressing   Problem: Coping: Goal: Level of anxiety will decrease Outcome: Progressing   Problem: Elimination: Goal: Will not experience complications related to bowel motility Outcome: Progressing Goal: Will not experience complications related to urinary retention Outcome: Progressing   Problem: Pain Managment: Goal: General experience of comfort will improve Outcome: Progressing   Problem: Safety: Goal: Ability to remain free from injury will improve Outcome: Progressing   Problem: Skin Integrity: Goal: Risk for impaired skin integrity will decrease Outcome: Progressing   

## 2021-02-24 DIAGNOSIS — R627 Adult failure to thrive: Secondary | ICD-10-CM | POA: Diagnosis not present

## 2021-02-24 LAB — BASIC METABOLIC PANEL
Anion gap: 10 (ref 5–15)
BUN: 11 mg/dL (ref 8–23)
CO2: 26 mmol/L (ref 22–32)
Calcium: 9 mg/dL (ref 8.9–10.3)
Chloride: 99 mmol/L (ref 98–111)
Creatinine, Ser: 0.71 mg/dL (ref 0.44–1.00)
GFR, Estimated: >60 mL/min (ref >60)
Glucose, Bld: 353 mg/dL — ABNORMAL HIGH (ref 70–99)
Potassium: 4.1 mmol/L (ref 3.5–5.1)
Sodium: 135 mmol/L (ref 135–145)

## 2021-02-24 LAB — CBC WITH DIFFERENTIAL/PLATELET
Abs Immature Granulocytes: 0.03 10*3/uL (ref 0.00–0.07)
Basophils Absolute: 0 10*3/uL (ref 0.0–0.1)
Basophils Relative: 0 %
Eosinophils Absolute: 0.1 10*3/uL (ref 0.0–0.5)
Eosinophils Relative: 1 %
HCT: 31.4 % — ABNORMAL LOW (ref 36.0–46.0)
Hemoglobin: 10.5 g/dL — ABNORMAL LOW (ref 12.0–15.0)
Immature Granulocytes: 0 %
Lymphocytes Relative: 16 %
Lymphs Abs: 1.3 10*3/uL (ref 0.7–4.0)
MCH: 31.8 pg (ref 26.0–34.0)
MCHC: 33.4 g/dL (ref 30.0–36.0)
MCV: 95.2 fL (ref 80.0–100.0)
Monocytes Absolute: 1 10*3/uL (ref 0.1–1.0)
Monocytes Relative: 13 %
Neutro Abs: 5.5 10*3/uL (ref 1.7–7.7)
Neutrophils Relative %: 70 %
Platelets: 286 10*3/uL (ref 150–400)
RBC: 3.3 MIL/uL — ABNORMAL LOW (ref 3.87–5.11)
RDW: 14.5 % (ref 11.5–15.5)
WBC: 8 10*3/uL (ref 4.0–10.5)
nRBC: 0 % (ref 0.0–0.2)

## 2021-02-24 LAB — GLUCOSE, CAPILLARY
Glucose-Capillary: 180 mg/dL — ABNORMAL HIGH (ref 70–99)
Glucose-Capillary: 166 mg/dL — ABNORMAL HIGH (ref 70–99)
Glucose-Capillary: 168 mg/dL — ABNORMAL HIGH (ref 70–99)
Glucose-Capillary: 431 mg/dL — ABNORMAL HIGH (ref 70–99)

## 2021-02-24 LAB — RESP PANEL BY RT-PCR (FLU A&B, COVID) ARPGX2
Influenza A by PCR: NEGATIVE
Influenza B by PCR: NEGATIVE
SARS Coronavirus 2 by RT PCR: NEGATIVE

## 2021-02-24 LAB — MAGNESIUM: Magnesium: 1.5 mg/dL — ABNORMAL LOW (ref 1.7–2.4)

## 2021-02-24 MED ORDER — MAGNESIUM SULFATE IN D5W 1-5 GM/100ML-% IV SOLN
1.0000 g | Freq: Once | INTRAVENOUS | Status: AC
  Administered 2021-02-24: 1 g via INTRAVENOUS
  Filled 2021-02-24: qty 100

## 2021-02-24 MED ORDER — INSULIN ASPART 100 UNIT/ML IJ SOLN
8.0000 [IU] | Freq: Once | INTRAMUSCULAR | Status: AC
  Administered 2021-02-24: 8 [IU] via INTRAVENOUS

## 2021-02-24 MED ORDER — MAGNESIUM SULFATE 2 GM/50ML IV SOLN
2.0000 g | Freq: Once | INTRAVENOUS | Status: AC
  Administered 2021-02-24: 2 g via INTRAVENOUS
  Filled 2021-02-24: qty 50

## 2021-02-24 NOTE — Progress Notes (Signed)
PROGRESS NOTE    Jaclyn [name]  TKZ:[id number]  DOB: [birth year]  DOA: 02/15/2021 PCP: Jinny Sanders, MD Outpatient Specialists:   Hospital course:  86 year old female with atrial fibrillation on Eliquis, fibromyalgia, HTN, poorly controlled DM was admitted 02/16/2021 with subacute worsening of functional status/FTT.  She had been having urinary incontinence, increased confusion much worse than baseline mild dementia, decreased p.o. intake and on ability to care for herself.  Work-up in the ED was notable for uncontrolled hypothyroidism, marked hyperglycemia and UTI.  Subjective: Patient in bed in no distress but confused, denies any headache chest or abdominal pain.  Objective: Vitals:   02/23/21 2000 02/24/21 0000 02/24/21 0310 02/24/21 0721  BP: 128/81 (!) 146/73 (!) 121/54 121/67  Pulse: 93 97 99 90  Resp: 18 18 19 18   Temp: 98 F (36.7 C) 97.8 F (36.6 C) 97.9 F (36.6 C) 97.8 F (36.6 C)  TempSrc: Oral Axillary Axillary Oral  SpO2: 96% 95% 91% 93%  Weight:      Height:        Intake/Output Summary (Last 24 hours) at 02/24/2021 1143 Last data filed at 02/24/2021 1100 Gross per 24 hour  Intake 290 ml  Output 1100 ml  Net -810 ml   Filed Weights   02/15/21 1732 02/21/21 0400  Weight: 68.5 kg 63.4 kg     Exam:  Awake but pleasantly confused, no focal deficits moving all 4 extremities by herself [place].AT,PERRAL Supple Neck, No JVD,   Symmetrical Chest wall movement, Good air movement bilaterally, CTAB RRR,No Gallops, Rubs or new Murmurs,  +ve B.Sounds, Abd Soft, No tenderness,   No Cyanosis, Clubbing or edema     Assessment & Plan:   Language abnormality - happened in the hospital on the day of admission, likely due to encephalopathy, MRI unremarkable no focal deficits.  Continue to monitor on Eliquis for A. fib already which will be continued.  Functional decline/FTT with metabolic and toxic encephalopathy-  Patient seems to have combination of severe  hypothyroidism with TSH greater than 20, UTI, underlying dementia along with hospital-acquired delirium.  MRI here unremarkable, no focal deficits.  Stable on nighttime Seroquel.  Continue PT OT and eventual SNF discharge.    Hypothyroidism - TSH was 22, improving on IV Synthroid.  Monitor.  UTI - has finished antibiotic course, continue Myrbetriq was added for incontinence  Disposition - SNF  Paroxysmal A. fib.  Mali vas 2 score of greater than 4.  Continue Eliquis.  Not on any rate controlling agents.  Hyponatremia.  SIADH - resolved after Magnesium.  Hypomagnesemia.  Replaced  Uncontrolled DM2 - on Lantus, Premeal NovoLog and sliding scale, dose adjusted again on 02/22/2021, rapidly changing insulin requirements with improving TSH and free T4 levels insulin requirements will go down.  Poor outpatient control was likely due to poorly controlled hypothyroidism  Lab Results  Component Value Date   HGBA1C 14.4 (H) 12/25/2020   CBG (last 3)  Recent Labs    02/23/21 1712 02/23/21 2023 02/24/21 0724  GLUCAP 269* 288* 180*      DVT prophylaxis: Eliquis Code Status: Full Family Communication: None present Disposition Plan:   Patient is from: Home  Anticipated Discharge Location: SNF  Barriers to Discharge: Subacute confusion  Is patient medically stable for Discharge: No   Scheduled Meds:  (feeding supplement) PROSource Plus  30 mL Oral BID BM   apixaban  5 mg Oral BID   docusate sodium  100 mg Oral BID   DULoxetine  60 mg Oral Daily   folic acid  1 mg Oral Daily   insulin aspart  0-15 Units Subcutaneous TID WC   insulin aspart  0-5 Units Subcutaneous QHS   insulin aspart  3 Units Subcutaneous TID WC   insulin glargine-yfgn  38 Units Subcutaneous BID   levothyroxine  62.5 mcg Intravenous Daily   mirabegron ER  25 mg Oral Daily   multivitamin with minerals  1 tablet Oral Daily   polyethylene glycol  17 g Oral Daily   thiamine injection  100 mg Intravenous Daily    Vitamin D (Ergocalciferol)  50,000 Units Oral Q7 days   Continuous Infusions:  magnesium sulfate bolus IVPB       Data Reviewed:  Basic Metabolic Panel: Recent Labs  Lab 02/21/21 0114 02/22/21 0114 02/23/21 0044 02/23/21 2003 02/24/21 0023  NA 132* 134* 128* 138 135  K 3.6 3.5 3.8  --  4.1  CL 97* 100 96*  --  99  CO2 24 26 27   --  26  GLUCOSE 368* 118* 298*  --  353*  BUN 10 7* 10  --  11  CREATININE 0.74 0.64 0.69  --  0.71  CALCIUM 8.3* 8.2* 8.5*  --  9.0  MG 1.4* 1.9 1.7  --  1.5*    CBC: Recent Labs  Lab 02/21/21 0114 02/22/21 0114 02/23/21 0044 02/24/21 0023  WBC 6.0 6.3 5.8 8.0  NEUTROABS  --  4.2 3.4 5.5  HGB 9.9* 9.9* 9.6* 10.5*  HCT 29.1* 28.6* 28.5* 31.4*  MCV 93.6 93.8 93.8 95.2  PLT 203 231 250 286    Studies: No results found.  Principal Problem:   Failure to thrive in adult Active Problems:   Hypothyroidism   Poorly controlled type 2 diabetes mellitus with complication (HCC)   PAROXYSMAL ATRIAL FIBRILLATION   Dyslipidemia   UTI (urinary tract infection)   Incontinence   Ataxia   Signature  Lala Lund M.D on 02/24/2021 at 11:43 AM   -  To page go to [email or web address]    LOS: 8 days

## 2021-02-25 DIAGNOSIS — F01518 Vascular dementia, unspecified severity, with other behavioral disturbance: Secondary | ICD-10-CM | POA: Diagnosis not present

## 2021-02-25 DIAGNOSIS — U071 COVID-19: Secondary | ICD-10-CM | POA: Diagnosis not present

## 2021-02-25 DIAGNOSIS — M797 Fibromyalgia: Secondary | ICD-10-CM | POA: Diagnosis not present

## 2021-02-25 DIAGNOSIS — Z87440 Personal history of urinary (tract) infections: Secondary | ICD-10-CM | POA: Diagnosis not present

## 2021-02-25 DIAGNOSIS — R4182 Altered mental status, unspecified: Secondary | ICD-10-CM | POA: Diagnosis not present

## 2021-02-25 DIAGNOSIS — L22 Diaper dermatitis: Secondary | ICD-10-CM | POA: Diagnosis not present

## 2021-02-25 DIAGNOSIS — B372 Candidiasis of skin and nail: Secondary | ICD-10-CM | POA: Diagnosis not present

## 2021-02-25 DIAGNOSIS — R251 Tremor, unspecified: Secondary | ICD-10-CM | POA: Diagnosis not present

## 2021-02-25 DIAGNOSIS — G928 Other toxic encephalopathy: Secondary | ICD-10-CM | POA: Diagnosis not present

## 2021-02-25 DIAGNOSIS — I6529 Occlusion and stenosis of unspecified carotid artery: Secondary | ICD-10-CM | POA: Diagnosis not present

## 2021-02-25 DIAGNOSIS — R627 Adult failure to thrive: Secondary | ICD-10-CM | POA: Diagnosis not present

## 2021-02-25 DIAGNOSIS — E785 Hyperlipidemia, unspecified: Secondary | ICD-10-CM | POA: Diagnosis not present

## 2021-02-25 DIAGNOSIS — E1165 Type 2 diabetes mellitus with hyperglycemia: Secondary | ICD-10-CM | POA: Diagnosis not present

## 2021-02-25 DIAGNOSIS — N390 Urinary tract infection, site not specified: Secondary | ICD-10-CM | POA: Diagnosis not present

## 2021-02-25 DIAGNOSIS — F321 Major depressive disorder, single episode, moderate: Secondary | ICD-10-CM | POA: Diagnosis not present

## 2021-02-25 DIAGNOSIS — I480 Paroxysmal atrial fibrillation: Secondary | ICD-10-CM | POA: Diagnosis not present

## 2021-02-25 DIAGNOSIS — F0390 Unspecified dementia without behavioral disturbance: Secondary | ICD-10-CM | POA: Diagnosis not present

## 2021-02-25 DIAGNOSIS — E46 Unspecified protein-calorie malnutrition: Secondary | ICD-10-CM | POA: Diagnosis not present

## 2021-02-25 DIAGNOSIS — L98411 Non-pressure chronic ulcer of buttock limited to breakdown of skin: Secondary | ICD-10-CM | POA: Diagnosis not present

## 2021-02-25 DIAGNOSIS — W19XXXA Unspecified fall, initial encounter: Secondary | ICD-10-CM | POA: Diagnosis not present

## 2021-02-25 DIAGNOSIS — M6281 Muscle weakness (generalized): Secondary | ICD-10-CM | POA: Diagnosis not present

## 2021-02-25 DIAGNOSIS — E860 Dehydration: Secondary | ICD-10-CM | POA: Diagnosis not present

## 2021-02-25 DIAGNOSIS — R262 Difficulty in walking, not elsewhere classified: Secondary | ICD-10-CM | POA: Diagnosis not present

## 2021-02-25 DIAGNOSIS — E039 Hypothyroidism, unspecified: Secondary | ICD-10-CM | POA: Diagnosis not present

## 2021-02-25 DIAGNOSIS — G9341 Metabolic encephalopathy: Secondary | ICD-10-CM | POA: Diagnosis not present

## 2021-02-25 DIAGNOSIS — L24A2 Irritant contact dermatitis due to fecal, urinary or dual incontinence: Secondary | ICD-10-CM | POA: Diagnosis not present

## 2021-02-25 DIAGNOSIS — Z1159 Encounter for screening for other viral diseases: Secondary | ICD-10-CM | POA: Diagnosis not present

## 2021-02-25 DIAGNOSIS — E038 Other specified hypothyroidism: Secondary | ICD-10-CM | POA: Diagnosis not present

## 2021-02-25 DIAGNOSIS — R4701 Aphasia: Secondary | ICD-10-CM | POA: Diagnosis not present

## 2021-02-25 DIAGNOSIS — Z7401 Bed confinement status: Secondary | ICD-10-CM | POA: Diagnosis not present

## 2021-02-25 DIAGNOSIS — R5383 Other fatigue: Secondary | ICD-10-CM | POA: Diagnosis not present

## 2021-02-25 LAB — GLUCOSE, CAPILLARY
Glucose-Capillary: 284 mg/dL — ABNORMAL HIGH (ref 70–99)
Glucose-Capillary: 426 mg/dL — ABNORMAL HIGH (ref 70–99)
Glucose-Capillary: 182 mg/dL — ABNORMAL HIGH (ref 70–99)
Glucose-Capillary: 162 mg/dL — ABNORMAL HIGH (ref 70–99)

## 2021-02-25 LAB — MAGNESIUM: Magnesium: 2 mg/dL (ref 1.7–2.4)

## 2021-02-25 MED ORDER — LEVOTHYROXINE SODIUM 175 MCG PO TABS: 175.0000 ug | ORAL_TABLET | Freq: Every day | ORAL | Status: DC

## 2021-02-25 MED ORDER — LANTUS SOLOSTAR 100 UNIT/ML ~~LOC~~ SOPN: 42.0000 [IU] | PEN_INJECTOR | Freq: Two times a day (BID) | SUBCUTANEOUS | 0 refills | Status: DC

## 2021-02-25 MED ORDER — INSULIN GLARGINE-YFGN 100 UNIT/ML ~~LOC~~ SOLN
42.0000 [IU] | Freq: Two times a day (BID) | SUBCUTANEOUS | Status: DC
  Filled 2021-02-25: qty 0.42

## 2021-02-25 MED ORDER — INSULIN ASPART 100 UNIT/ML FLEXPEN: PEN_INJECTOR | SUBCUTANEOUS | Status: DC

## 2021-02-25 NOTE — Plan of Care (Signed)
°  Problem: Education: Goal: Knowledge of General Education information will improve Description: Including pain rating scale, medication(s)/side effects and non-pharmacologic comfort measures Outcome: Adequate for Discharge   Problem: Health Behavior/Discharge Planning: Goal: Ability to manage health-related needs will improve Outcome: Adequate for Discharge   Problem: Clinical Measurements: Goal: Ability to maintain clinical measurements within normal limits will improve Outcome: Adequate for Discharge Goal: Will remain free from infection Outcome: Adequate for Discharge Goal: Diagnostic test results will improve Outcome: Adequate for Discharge Goal: Respiratory complications will improve Outcome: Adequate for Discharge Goal: Cardiovascular complication will be avoided Outcome: Adequate for Discharge   Problem: Activity: Goal: Risk for activity intolerance will decrease Outcome: Adequate for Discharge   Problem: Nutrition: Goal: Adequate nutrition will be maintained Outcome: Adequate for Discharge   Problem: Coping: Goal: Level of anxiety will decrease Outcome: Adequate for Discharge   Problem: Elimination: Goal: Will not experience complications related to bowel motility Outcome: Adequate for Discharge Goal: Will not experience complications related to urinary retention Outcome: Adequate for Discharge   Problem: Pain Managment: Goal: General experience of comfort will improve Outcome: Adequate for Discharge   Problem: Safety: Goal: Ability to remain free from injury will improve Outcome: Adequate for Discharge   Problem: Skin Integrity: Goal: Risk for impaired skin integrity will decrease Outcome: Adequate for Discharge   Problem: Urinary Elimination: Goal: Signs and symptoms of infection will decrease Outcome: Adequate for Discharge   Problem: Education: Goal: Knowledge of General Education information will improve Description: Including pain rating scale,  medication(s)/side effects and non-pharmacologic comfort measures Outcome: Adequate for Discharge   Problem: Health Behavior/Discharge Planning: Goal: Ability to manage health-related needs will improve Outcome: Adequate for Discharge   Problem: Clinical Measurements: Goal: Ability to maintain clinical measurements within normal limits will improve Outcome: Adequate for Discharge Goal: Will remain free from infection Outcome: Adequate for Discharge Goal: Diagnostic test results will improve Outcome: Adequate for Discharge Goal: Respiratory complications will improve Outcome: Adequate for Discharge Goal: Cardiovascular complication will be avoided Outcome: Adequate for Discharge   Problem: Activity: Goal: Risk for activity intolerance will decrease Outcome: Adequate for Discharge   Problem: Nutrition: Goal: Adequate nutrition will be maintained Outcome: Adequate for Discharge   Problem: Coping: Goal: Level of anxiety will decrease Outcome: Adequate for Discharge   Problem: Elimination: Goal: Will not experience complications related to bowel motility Outcome: Adequate for Discharge Goal: Will not experience complications related to urinary retention Outcome: Adequate for Discharge   Problem: Pain Managment: Goal: General experience of comfort will improve Outcome: Adequate for Discharge   Problem: Safety: Goal: Ability to remain free from injury will improve Outcome: Adequate for Discharge   Problem: Skin Integrity: Goal: Risk for impaired skin integrity will decrease Outcome: Adequate for Discharge

## 2021-02-25 NOTE — TOC Progression Note (Signed)
Transition of Care [facility]) - Progression Note    Patient Details  Name: Jaclyn [name] MRN: [id number] Date of Birth: 1935-07-05  Transition of Care [facility]) CM/SW [place], LCSW Phone Number: 02/25/2021, 11:54 AM  Clinical Narrative:    CSW left message for patient's son that patient will be discharging to [facility] today.    Expected Discharge Plan: Skilled Nursing Facility Barriers to Discharge: Barriers Resolved  Expected Discharge Plan and Services Expected Discharge Plan: [place] In-house Referral: [facility], Clinical Social Work Discharge Planning Services: CM Consult Post Acute Care Choice: [place] Living arrangements for the past 2 months: Single Family Home Expected Discharge Date: 02/25/21                                     Social Determinants of Health (SDOH) Interventions    Readmission Risk Interventions No flowsheet data found.

## 2021-02-25 NOTE — TOC Transition Note (Signed)
Transition of Care [facility]) - CM/SW Discharge Note   Patient Details  Name: Carlinda Ohlson MRN: [id number] Date of Birth: [birth year]  Transition of Care [facility]) CM/SW Contact:  Benard Halsted, LCSW Phone Number: 02/25/2021, 12:41 PM   Clinical Narrative:    Patient will DC to: [facility] Anticipated DC date: 02/25/21 Family notified: Son, [name] Transport by: Corey Harold   Per MD patient ready for DC to [facility]. RN to call report prior to discharge [phone] room 102). RN, patient, patient's family, and facility notified of DC. Discharge Summary and FL2 sent to facility. DC packet on chart. Ambulance transport requested for patient.   CSW will sign off for now as social work intervention is no longer needed. Please consult [name] again if new needs arise.     Final next level of care: Skilled Nursing Facility Barriers to Discharge: Barriers Resolved   Patient Goals and CMS Choice Patient states their goals for this hospitalization and ongoing recovery are:: To get her strength back CMS Medicare.gov Compare Post Acute Care list provided to:: Patient Represenative (must comment) Choice offered to / list presented to : Adult Children  Discharge Placement   Existing PASRR number confirmed : 02/25/21          Patient [facility] bed at: [facility] Patient to be transferred to facility by: [place] Name of family member notified: Son Patient and family notified of of transfer: 02/25/21  Discharge Plan and Services In-house Referral: [facility], Clinical Social Work Discharge Planning Services: [employer] Consult Post Acute Care Choice: [place]                               Social Determinants of Health (SDOH) Interventions     Readmission Risk Interventions No flowsheet data found.

## 2021-02-25 NOTE — Progress Notes (Signed)
Report Given to [place] at [employer].  All questions answered and number provided incase of further questions.  Patient PIV was removed and all belongings packed at this time. PTAR at bedside to transport patient.  No s/s of distress noted.  Mitts were removed after PIV removal.  Patient safe for discharge.

## 2021-02-25 NOTE — Discharge Summary (Signed)
Jaclyn [name] HDQ:[id number] DOB: 12-20-1935 DOA: 02/15/2021  PCP: Jinny Sanders, MD  Admit date: 02/15/2021  Discharge date: 02/25/2021  Admitted From: SNF   Disposition:  SNF   Recommendations for Outpatient Follow-up:   Follow up with PCP in 1-2 weeks  PCP Please obtain BMP/CBC, 2 view CXR in 1week,  (see Discharge instructions)   PCP Please follow up on the following pending results: Kindly monitor CBGs and TSH, free T4 closely.  Will need monitoring of insulin and Synthroid closely for the next 1 month.   Home Health: None   Equipment/Devices: None  Consultations: None  Discharge Condition: Stable    CODE STATUS: No to intubation Diet Recommendation: Low carbohydrate  Diet Order             Diet Carb Modified Fluid consistency: Thin; Room service appropriate? No  Diet effective now                    Chief Complaint  Patient presents with   Failure To Thrive     Brief history of present illness from the day of admission and additional interim summary    86 year old female with atrial fibrillation on Eliquis, fibromyalgia, HTN, poorly controlled DM was admitted 02/16/2021 with subacute worsening of functional status/FTT.  She had been having urinary incontinence, increased confusion much worse than baseline mild dementia, decreased p.o. intake and on ability to care for herself.  Work-up in the ED was notable for uncontrolled hypothyroidism, marked hyperglycemia and UTI.                                                                 Hospital Course    Language abnormality - happened in the hospital on the day of admission, likely due to encephalopathy, MRI unremarkable no focal deficits.  Continue to monitor on Eliquis for A. fib already which will be continued.   Functional decline/FTT  with metabolic and toxic encephalopathy-  Patient seems to have combination of severe hypothyroidism with TSH greater than 20, UTI, underlying dementia along with hospital-acquired delirium.  MRI here unremarkable, no focal deficits.  Stable on nighttime Seroquel.  Continue PT OT and eventual SNF discharge.     Hypothyroidism - TSH was 22, improving on IV Synthroid.  Patient to higher than home dose Synthroid upon discharge, will require close TSH and free T4 monitoring once every 2 weeks for a month at SNF.  Question compliance with previous Synthroid dose   UTI - has finished antibiotic course, continue Myrbetriq was added for incontinence   Disposition - SNF   Paroxysmal A. fib.  Mali vas 2 score of greater than 4.  Continue Eliquis.  Not on any rate controlling agents.   Hyponatremia.  SIADH - resolved after Magnesium.   Hypomagnesemia.  Replaced  Uncontrolled DM2 - on Lantus, and sliding scale insulin.  Note will need CBG checks q. ACH S at SNF with adjustment of insulin dose as her hypothyroidism improves her insulin requirements may come down.   Discharge diagnosis     Principal Problem:   Failure to thrive in adult Active Problems:   Hypothyroidism   Poorly controlled type 2 diabetes mellitus with complication (HCC)   PAROXYSMAL ATRIAL FIBRILLATION   Dyslipidemia   UTI (urinary tract infection)   Incontinence   Ataxia    Discharge instructions    Discharge Instructions     Discharge instructions   Complete by: As directed    Follow with Primary MD Diona Browner, Amy E, MD in 7 days   Get CBC, CMP, TSH, free T4, 2 view Chest X ray -  checked next visit within 1 week by SNF MD    Activity: As tolerated with Full fall precautions use walker/cane & assistance as needed  Disposition SNF  Diet: Heart Healthy low carbohydrate diet, check CBGs q. [facility]  Special Instructions: If you have smoked or chewed Tobacco  in the last 2 yrs please stop smoking, stop any regular  Alcohol  and or any Recreational drug use.  On your next visit with your primary care physician please Get Medicines reviewed and adjusted.  Please request your Prim.MD to go over all Hospital Tests and Procedure/Radiological results at the follow up, please get all Hospital records sent to your Prim MD by signing hospital release before you go home.  If you experience worsening of your admission symptoms, develop shortness of breath, life threatening emergency, suicidal or homicidal thoughts you must seek medical attention immediately by calling 911 or calling your MD immediately  if symptoms less severe.  You Must read complete instructions/literature along with all the possible adverse reactions/side effects for all the Medicines you take and that have been prescribed to you. Take any new Medicines after you have completely understood and accpet all the possible adverse reactions/side effects.   Increase activity slowly   Complete by: As directed        Discharge Medications   Allergies as of 02/25/2021       Reactions   Naproxen Sodium Other (See Comments)   Fever/aches and pains   Statins Other (See Comments)   myalgias   Sulfonamide Derivatives Hives, Itching   Latex Itching, Rash   Shellfish Allergy Itching, Swelling, Rash   Seafood, shrimp        Medication List     TAKE these medications    B-D ULTRAFINE III SHORT PEN 31G X 8 MM Misc Generic drug: Insulin Pen Needle USE TO INJECT INSULIN ONCE DAILY. DX: E11.65   DULoxetine 30 MG capsule Commonly known as: CYMBALTA TAKE 2 CAPSULES BY MOUTH EVERY DAY   Eliquis 5 MG Tabs tablet Generic drug: apixaban TAKE 1 TABLET BY MOUTH TWICE A DAY What changed: how much to take   insulin aspart 100 UNIT/ML FlexPen Commonly known as: NOVOLOG Before each meal 3 times a day, 140-199 - 2 units, 200-250 - 6 units, 251-299 - 8 units,  300-349 - 10 units,  350 or above 12 units.   Januvia 100 MG tablet Generic drug:  sitaGLIPtin TAKE 1 TABLET BY MOUTH EVERY DAY What changed: how much to take   Lantus SoloStar 100 UNIT/ML Solostar Pen Generic drug: insulin glargine Inject 42 Units into the skin 2 (two) times daily. What changed:  how much to take when to take  this additional instructions   levothyroxine 175 MCG tablet Commonly known as: SYNTHROID Take 1 tablet (175 mcg total) by mouth daily. What changed:  medication strength how much to take   trimethoprim 100 MG tablet Commonly known as: TRIMPEX TAKE 1 TABLET BY MOUTH EVERY DAY   Vitamin D3 1.25 MG (50000 UT) Caps Take 1 capsule by mouth once a week. What changed: how much to take         Follow-up Information     Diona Browner, Amy E, MD. Schedule an appointment as soon as possible for a visit in 1 week(s).   Specialty: Family Medicine Contact information: [place] [address] [phone]         Josue Hector, MD .   Specialty: Cardiology Contact information: [phone] [address] [place] [place] [address] [phone]                 Major procedures and Radiology Reports - PLEASE review detailed and final reports thoroughly  -       CT HEAD WO CONTRAST (5MM)  Result Date: 02/19/2021 CLINICAL DATA:  Altered mental status. EXAM: CT HEAD WITHOUT CONTRAST TECHNIQUE: Contiguous axial images were obtained from the base of the skull through the vertex without intravenous contrast. COMPARISON:  February 15, 2021 FINDINGS: Brain: There is moderate severity cerebral atrophy with widening of the extra-axial spaces and ventricular dilatation. There are areas of decreased attenuation within the white matter tracts of the supratentorial brain, consistent with microvascular disease changes. There is bilateral, predominant symmetric basal ganglia calcification. Vascular: No hyperdense vessels are identified. Skull: Normal. Negative for fracture or focal lesion. Sinuses/Orbits: No acute finding. Other:  None. IMPRESSION: 1. Generalized cerebral atrophy. 2. No acute intracranial abnormality. Electronically Signed   By: Virgina Norfolk M.D.   On: 02/19/2021 00:55   CT Head Wo Contrast  Result Date: 02/15/2021 CLINICAL DATA:  Mental status change of unknown cause EXAM: CT HEAD WITHOUT CONTRAST TECHNIQUE: Contiguous axial images were obtained from the base of the skull through the vertex without intravenous contrast. COMPARISON:  MRI 08/29/2019.  CT 08/29/2019 FINDINGS: Brain: Diffuse cerebral atrophy. Ventricular dilatation consistent with central atrophy. Low-attenuation changes in the deep white matter consistent with small vessel ischemia. Basal ganglia calcifications. No abnormal extra-axial fluid collections. No mass effect or midline shift. Gray-white matter junctions are distinct. Basal cisterns are not effaced. No acute intracranial hemorrhage. Similar appearance to previous study. Vascular: Moderate intracranial arterial vascular calcifications. Skull: Calvarium appears intact. Sinuses/Orbits: Paranasal sinuses and mastoid air cells are clear. Other: None. IMPRESSION: No acute intracranial abnormalities. Chronic atrophy and small vessel ischemic changes. Electronically Signed   By: Lucienne Capers M.D.   On: 02/15/2021 19:41   MR BRAIN WO CONTRAST  Result Date: 02/20/2021 CLINICAL DATA:  TIA, altered mental status EXAM: MRI HEAD WITHOUT CONTRAST TECHNIQUE: Multiplanar, multiecho pulse sequences of the brain and surrounding structures were obtained without intravenous contrast. COMPARISON:  July 2021 FINDINGS: Motion artifact is present. Brain: There is no acute infarction or intracranial hemorrhage. There is no intracranial mass, mass effect, or edema. There is no hydrocephalus or extra-axial fluid collection. Prominence of the ventricles and sulci reflects similar parenchymal volume loss. Patchy T2 hyperintensity in the supratentorial white matter probably reflects similar chronic microvascular  ischemic changes. Vascular: Major vessel flow voids at the skull base are preserved. Skull and upper cervical spine: Normal marrow signal is preserved. Sinuses/Orbits: Paranasal sinus mucosal thickening. Left lens replacement. Other: Sella  is unremarkable.  Mastoid air cells are clear. IMPRESSION: Motion degraded.  No acute infarction. Electronically Signed   By: Macy Mis M.D.   On: 02/20/2021 11:31     Today   Subjective    Sybella Mckelvin today has no headache,no chest abdominal pain,no new weakness tingling or numbness, feels much better     Objective   Blood pressure 133/67, pulse 96, temperature 97.7 F (36.5 C), temperature source Axillary, resp. rate 19, height 5\' 2"  (1.575 m), weight 63.4 kg, SpO2 92 %.   Intake/Output Summary (Last 24 hours) at 02/25/2021 1047 Last data filed at 02/24/2021 2048 Gross per 24 hour  Intake 50 ml  Output 550 ml  Net -500 ml    Exam  Awake Alert x1 , No new F.N deficits,   [place].AT,PERRAL Supple Neck,No JVD, No cervical lymphadenopathy appriciated.  Symmetrical Chest wall movement, Good air movement bilaterally, CTAB RRR,  +ve B.Sounds, Abd Soft, Non tender,   No Cyanosis, Clubbing or edema,    Data Review   CBC w Diff:  Lab Results  Component Value Date   WBC 8.0 02/24/2021   HGB 10.5 (L) 02/24/2021   HCT 31.4 (L) 02/24/2021   PLT 286 02/24/2021   LYMPHOPCT 16 02/24/2021   MONOPCT 13 02/24/2021   EOSPCT 1 02/24/2021   BASOPCT 0 02/24/2021    CMP:  Lab Results  Component Value Date   NA 135 02/24/2021   K 4.1 02/24/2021   CL 99 02/24/2021   CO2 26 02/24/2021   BUN 11 02/24/2021   CREATININE 0.71 02/24/2021   CREATININE 0.81 02/10/2019   PROT 5.6 (L) 02/21/2021   ALBUMIN 2.4 (L) 02/21/2021   BILITOT 0.7 02/21/2021   ALKPHOS 54 02/21/2021   AST 29 02/21/2021   ALT 19 02/21/2021  .  Lab Results  Component Value Date   TSH 17.857 (H) 02/23/2021    Total Time in preparing paper work, data evaluation and todays  exam - 31 minutes  Lala Lund M.D on 02/25/2021 at 10:47 AM  [facility]

## 2021-02-25 NOTE — Discharge Instructions (Addendum)
Follow with Primary MD Diona Browner, Amy E, MD in 7 days   Get CBC, CMP, TSH, free T4, 2 view Chest X ray -  checked next visit within 1 week by SNF MD    Activity: As tolerated with Full fall precautions use walker/cane & assistance as needed  Disposition SNF  Diet: Heart Healthy low carbohydrate diet, check CBGs q. [facility]  Special Instructions: If you have smoked or chewed Tobacco  in the last 2 yrs please stop smoking, stop any regular Alcohol  and or any Recreational drug use.  On your next visit with your primary care physician please Get Medicines reviewed and adjusted.  Please request your Prim.MD to go over all Hospital Tests and Procedure/Radiological results at the follow up, please get all Hospital records sent to your Prim MD by signing hospital release before you go home.  If you experience worsening of your admission symptoms, develop shortness of breath, life threatening emergency, suicidal or homicidal thoughts you must seek medical attention immediately by calling 911 or calling your MD immediately  if symptoms less severe.  You Must read complete instructions/literature along with all the possible adverse reactions/side effects for all the Medicines you take and that have been prescribed to you. Take any new Medicines after you have completely understood and accpet all the possible adverse reactions/side effects.

## 2021-02-26 ENCOUNTER — Telehealth: Payer: Medicare Other

## 2021-02-26 ENCOUNTER — Telehealth: Payer: Self-pay

## 2021-02-26 ENCOUNTER — Ambulatory Visit (INDEPENDENT_AMBULATORY_CARE_PROVIDER_SITE_OTHER): Payer: Medicare Other | Admitting: *Deleted

## 2021-02-26 DIAGNOSIS — E038 Other specified hypothyroidism: Secondary | ICD-10-CM

## 2021-02-26 DIAGNOSIS — F01518 Vascular dementia, unspecified severity, with other behavioral disturbance: Secondary | ICD-10-CM

## 2021-02-26 DIAGNOSIS — E1165 Type 2 diabetes mellitus with hyperglycemia: Secondary | ICD-10-CM

## 2021-02-26 DIAGNOSIS — Z87440 Personal history of urinary (tract) infections: Secondary | ICD-10-CM | POA: Diagnosis not present

## 2021-02-26 DIAGNOSIS — R5383 Other fatigue: Secondary | ICD-10-CM | POA: Diagnosis not present

## 2021-02-26 DIAGNOSIS — F0390 Unspecified dementia without behavioral disturbance: Secondary | ICD-10-CM | POA: Diagnosis not present

## 2021-02-26 NOTE — Patient Instructions (Signed)
Visit Information  Thank you for taking time to visit with me today. Please don't hesitate to contact me if I can be of assistance to you before our next scheduled telephone appointment.  Following are the goals we discussed today:  Follow up with me to discuss plans/needs further. Also, suggest talking to the Education officer, museum at [facility] regarding long term plans and needs.  Our next appointment is by telephone on 03/29/21 at  11:30  Please call the care guide team at [phone] if you need to cancel or reschedule your appointment.   If you are experiencing a Mental Health or [place] or need someone to talk to, please call the Suicide and Crisis Lifeline: 988 call 1-800-273-TALK (toll free, 24 hour hotline) call 911   Patient verbalizes understanding of instructions provided today and agrees to view in [place].   Eduard Clos MSW, LCSW Licensed Clinical Social Worker [place] [phone]

## 2021-02-26 NOTE — Chronic Care Management (AMB) (Signed)
Chronic Care Management    Clinical Social Work Note  02/26/2021 Name: Jaclyn [name] MRN: [id number] DOB: 25-Feb-1935  Jaclyn [name] is a 86 y.o. year old female who is a primary care patient of Bedsole, Amy E, MD. The CCM team was consulted to assist the patient with chronic disease management and/or care coordination needs related to: Level of Care Concerns.   Engaged with patient by telephone for follow up visit in response to provider referral for social work chronic care management and care coordination services.   Consent to Services:  The patient was given information about Chronic Care Management services, agreed to services, and gave verbal consent prior to initiation of services.  Please see initial visit note for detailed documentation.   Patient agreed to services and consent obtained.   Assessment: Review of patient past medical history, allergies, medications, and health status, including review of relevant consultants reports was performed today as part of a comprehensive evaluation and provision of chronic care management and care coordination services.     SDOH (Social Determinants of Health) assessments and interventions performed:    Advanced Directives Status: Not addressed in this encounter.  CCM Care Plan  Allergies  Allergen Reactions   Naproxen Sodium Other (See Comments)    Fever/aches and pains   Statins Other (See Comments)    myalgias   Sulfonamide Derivatives Hives and Itching   Latex Itching and Rash   Shellfish Allergy Itching, Swelling and Rash    Seafood, shrimp    Outpatient Encounter Medications as of 02/26/2021  Medication Sig   apixaban (ELIQUIS) 5 MG TABS tablet TAKE 1 TABLET BY MOUTH TWICE A DAY (Patient taking differently: Take 5 mg by mouth 2 (two) times daily.)   B-D ULTRAFINE III SHORT PEN 31G X 8 MM MISC USE TO INJECT INSULIN ONCE DAILY. DX: E11.65   Cholecalciferol (VITAMIN D3) 1.25 MG (50000 UT) CAPS Take 1 capsule by mouth once a  week. (Patient taking differently: Take 50,000 Units by mouth once a week.)   DULoxetine (CYMBALTA) 30 MG capsule TAKE 2 CAPSULES BY MOUTH EVERY DAY (Patient taking differently: Take 60 mg by mouth daily.)   insulin aspart (NOVOLOG) 100 UNIT/ML FlexPen Before each meal 3 times a day, 140-199 - 2 units, 200-250 - 6 units, 251-299 - 8 units,  300-349 - 10 units,  350 or above 12 units.   insulin glargine (LANTUS SOLOSTAR) 100 UNIT/ML Solostar Pen Inject 42 Units into the skin 2 (two) times daily.   JANUVIA 100 MG tablet TAKE 1 TABLET BY MOUTH EVERY DAY (Patient taking differently: Take 100 mg by mouth daily.)   levothyroxine (SYNTHROID) 175 MCG tablet Take 1 tablet (175 mcg total) by mouth daily.   trimethoprim (TRIMPEX) 100 MG tablet TAKE 1 TABLET BY MOUTH EVERY DAY (Patient taking differently: Take 100 mg by mouth daily.)   No facility-administered encounter medications on file as of 02/26/2021.    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   Recurrent UTI    Dyslipidemia  Orthostasis    Dysarthria    Vagina, candidiasis 03/10/2019   Dementia ([place]) 03/10/2019   Acute respiratory failure with hypoxia ([place]) 47/10/6281   Acute metabolic encephalopathy 66/29/4765   COVID-19 virus infection 01/15/2019   Acute lower UTI 01/15/2019   B12 deficiency 01/22/2018   History of CVA (cerebrovascular accident) 06/20/2017   Stenosis of right carotid artery    Peripheral edema 07/29/2016   Imbalance 04/07/2016   Intertriginous candidiasis 04/26/2015   Hard  of hearing 04/12/2015   TIA (transient ischemic attack) 04/10/2015   History of recurrent UTIs 04/10/2015   Dysuria 03/09/2015   Tremor 05/11/2014   Leg weakness, bilateral 05/11/2014   Fatty liver 08/04/2013   Personal history of colonic polyps 10/05/2012   Esophageal reflux 10/05/2012   Carotid stenosis, symptomatic, with infarction ([place]) 10/14/2010   MDD (major depressive disorder), single episode, moderate ([place]) 01/13/2008   Essential hypertension, benign 01/13/2008   PAROXYSMAL ATRIAL FIBRILLATION 01/13/2008   Intracranial vascular stenosis 01/13/2008   DIVERTICULOSIS OF COLON 01/13/2008   Fibromyalgia 01/13/2008   CHEST PAIN, ATYPICAL 01/13/2008   Hypothyroidism 04/13/2007   Poorly controlled type 2 diabetes mellitus with complication ([place]) 46/50/3546   Vitamin D deficiency 04/13/2007   Hyperlipidemia LDL goal <70 04/13/2007   DEGENERATIVE JOINT DISEASE 04/13/2007    Conditions to be addressed/monitored: HTN and DMII; Level of care concerns and Limited access to caregiver  Care Plan : LCSW Plan of Care  Updates made by Deirdre Peer, LCSW since 02/26/2021 12:00 AM     Problem: Social and Functional Symptoms   Priority: High     Long-Range Goal: Educate and provide resources and support to aide in patient's overall QOL   Start Date: 02/06/2021  Expected End Date: 04/16/2021  This Visit's Progress: On track  Recent Progress: On track  Priority: High  Note:   Current SDOH Barriers:  Financial constraints related to limited income , Limited social support, Transportation, Medication procurement, Social Isolation, Limited access to caregiver, Cognitive Deficits, and Lacks knowledge of community resource:   Lacks social connections Son seeking  in home help when he is at work  Clinical Social Work Clinical Goal(s):  patient will work with care management team to identify and address any acute and/or chronic care coordination needs related to the self health management  of DM and Dementia and Financial constraints , Limited social support, Transportation, Medication procurement, Social Isolation, Limited access to caregiver, and Lacks knowledge of community resource:  patient will work with SW to address concerns related to possible in home caregiver/Medicaid eligibility,   Interventions:  02/26/21- Per son, pt is admitted to [facility] after hospitalization. Unsure of plans for long term placement- son to call CSW back when he can talk more as he was at work at time of call.    CSW made initial call and spoke with pt's son, [name], caregiver.  Per [name], he is seeking help with getting care for pt at home- CSW inquired about pt's income and assets and is likely not eligible for Medicaid based on the amount of SS income she gets as well as "2 pieces of land".  Discussed with son and suggested we reasses after the new year to see if her income and the guidelines for Medicaid change enough to make a difference. Per son, pt does not have LTC insurance nor is she eligible for VA benefits/programs.  Pt's son became upset with the facts and shared that he has other family who are getting  help and they have more than she does". Encouraged him to inquire with them for assistance.  Son reports she struggles to pay her RX copays- one being Eliquis- will ask Pharmacy to assist if able.  Patient interviewed and SDOH assessment performed inter-disciplinary care team collaboration (see longitudinal plan of care) Collaboration with Jinny Sanders, MD regarding development and update of comprehensive plan of care as evidenced by provider attestation and co-signature Patient interviewed and appropriate assessments performed Referred patient to community resources care guide team for assistance with community resources- life alert, senior centers, transportation, etc Provided patient with information about Medicaid guidelines, insurance options/benefits, etc  Patient Self Care  Activities:  Patient will attend all scheduled provider appointments  Follow Up Plan: Telephone follow up appointment with care management team member scheduled for: 03/29/21      Follow Up Plan: Appointment scheduled for SW follow up with client by phone on: 03/29/21      Eduard Clos MSW, [place] Licensed Clinical Social Worker [place] [phone]

## 2021-02-26 NOTE — Progress Notes (Signed)
° ° °  Chronic Care Management Pharmacy Assistant   Name: Jaclyn [name]  MRN: [id number] DOB: 17-May-1935  Reason for Encounter: CCM (Patient Assistance Update)   I called patient's son [name] to check on the status of him filling out patient assistance forms for Eliquis, Trulicity and Lantus. [name] stated he has not filled out the forms. He would like all the forms resent to him by both e-mail and mail. His e-mail address is [email or web address]. PAP forms for all three have been uploaded and ready for review.   [name], [name] notified  Marijean Niemann, [place] Clinical Pharmacy Assistant [phone]  Time Spent: 30 Minutes

## 2021-02-27 DIAGNOSIS — L24A2 Irritant contact dermatitis due to fecal, urinary or dual incontinence: Secondary | ICD-10-CM | POA: Diagnosis not present

## 2021-02-27 DIAGNOSIS — L98411 Non-pressure chronic ulcer of buttock limited to breakdown of skin: Secondary | ICD-10-CM | POA: Diagnosis not present

## 2021-02-28 DIAGNOSIS — I480 Paroxysmal atrial fibrillation: Secondary | ICD-10-CM | POA: Diagnosis not present

## 2021-02-28 DIAGNOSIS — E1165 Type 2 diabetes mellitus with hyperglycemia: Secondary | ICD-10-CM | POA: Diagnosis not present

## 2021-02-28 DIAGNOSIS — E039 Hypothyroidism, unspecified: Secondary | ICD-10-CM | POA: Diagnosis not present

## 2021-02-28 DIAGNOSIS — G9341 Metabolic encephalopathy: Secondary | ICD-10-CM | POA: Diagnosis not present

## 2021-02-28 NOTE — Progress Notes (Addendum)
[facility] in regards to patient's Eliquis patient assistance application.[employer] does not have an enrollment form for 2023. Patient will also need to send in proof of out-of-pocket pharmacy expenses for 2023. Information and forms can be faxed to [phone]. Please add patient's full name, date of birth and ID #: [id number] on fax cover sheet.   [facility] [facility] for status of patient's assistance forms for Trulicity. Patient has been approved for 12/11/2020 - 12/11/2021. No further action is needed.   Called [employer] in regards to patient assistance forms for Lantus. [employer] stated they need a new application for 0254. With the PAP forms [employer] also needs a Architectural technologist or Benefits from patient's insurance. Please include patient's full name, date of birth and ID #: [id number] on the fax cover sheet. Forms can be faxed to [phone].  Previously spoke with patient's son [name]. He would like forms sent by e-mail and by mail (in case he can not print them off). E-mail address is [email or web address].  PAP forms for all three have been uploaded and ready for review.   [name], [name] notified  Marijean Niemann, [place] Clinical Pharmacy Assistant [phone]

## 2021-03-01 ENCOUNTER — Telehealth: Payer: Self-pay

## 2021-03-01 DIAGNOSIS — R4182 Altered mental status, unspecified: Secondary | ICD-10-CM | POA: Diagnosis not present

## 2021-03-01 DIAGNOSIS — R5383 Other fatigue: Secondary | ICD-10-CM | POA: Diagnosis not present

## 2021-03-01 DIAGNOSIS — I6529 Occlusion and stenosis of unspecified carotid artery: Secondary | ICD-10-CM | POA: Diagnosis not present

## 2021-03-01 NOTE — Telephone Encounter (Addendum)
Eliquis application reviewed and emailed to patient with detailed instructions to attach the following documents: Proof of household income (such as federal tax return, Science writer) Proof of out-of-pocket prescription expenses for the household (such as a Careers adviser)  I did not include the Lantus form at this time as her dose was adjusted during recent hospital stay. She is currently in SNF. She will need to see Dr. Diona Browner to confirm dose before we apply for assistance. Trulicity is already approved through next October.  Jaclyn [name], PharmD Clinical Pharmacist Practitioner [facility] at [facility] [phone]

## 2021-03-01 NOTE — Progress Notes (Addendum)
° ° °  Chronic Care Management Pharmacy Assistant   Name: Jaclyn [name]  MRN: [id number] DOB: 10/22/1935  Reason for Encounter: CCM (Son's concerns with patient's health)   I called and spoke with patient's son Jaclyn [name]) in regards to patient assistance forms. While on the phone Jaclyn [name] stated he is very concerned with his mother's health. Patient was admitted to the hospital on 02/15/2021 for a UTI. Patient was discharged on 02/25/2021. When patient was discharged she was taken to [facility]. Patient is currently still there. Jaclyn [name] states while in the hospital his mother was coherent and could speak (although she was loopy, but still made sense). As of Monday night (02/25/2021) the patient would speak and not make any sense while Jaclyn [name] was there. Jaclyn [name] brought it to the attention of an Therapist, sports at [facility]. Jaclyn [name] states since Monday night his mother has not been able to speak where she makes sense as she could previously. [facility] stated they would check her for another UTI, but Jaclyn [name] has not heard the results. Jaclyn [name] wanted to let Dr. Diona Browner his concern for his mother.   [name], [name] notified  Marijean Niemann, [place] Clinical Pharmacy Assistant [phone]  I have reviewed the care management and care coordination activities outlined in this encounter and I am certifying that I agree with the content of this note. No further action needed.  [name], PharmD Clinical Pharmacist [facility] at [facility] [phone]

## 2021-03-01 NOTE — Progress Notes (Signed)
Called patient's son to inform him we have emailed the patient assistance forms for Eliquis. Patient's son will be on the look out for the forms.   [name], [name] notified  Marijean Niemann, [place] Clinical Pharmacy Assistant [phone]  Time Spent: 5 Minutes

## 2021-03-04 DIAGNOSIS — L98411 Non-pressure chronic ulcer of buttock limited to breakdown of skin: Secondary | ICD-10-CM | POA: Diagnosis not present

## 2021-03-04 DIAGNOSIS — R4182 Altered mental status, unspecified: Secondary | ICD-10-CM | POA: Diagnosis not present

## 2021-03-04 DIAGNOSIS — L24A2 Irritant contact dermatitis due to fecal, urinary or dual incontinence: Secondary | ICD-10-CM | POA: Diagnosis not present

## 2021-03-04 DIAGNOSIS — R5383 Other fatigue: Secondary | ICD-10-CM | POA: Diagnosis not present

## 2021-03-06 DIAGNOSIS — W19XXXA Unspecified fall, initial encounter: Secondary | ICD-10-CM | POA: Diagnosis not present

## 2021-03-06 DIAGNOSIS — F0390 Unspecified dementia without behavioral disturbance: Secondary | ICD-10-CM | POA: Diagnosis not present

## 2021-03-06 DIAGNOSIS — M6281 Muscle weakness (generalized): Secondary | ICD-10-CM | POA: Diagnosis not present

## 2021-03-06 DIAGNOSIS — L22 Diaper dermatitis: Secondary | ICD-10-CM | POA: Diagnosis not present

## 2021-03-11 DIAGNOSIS — F0390 Unspecified dementia without behavioral disturbance: Secondary | ICD-10-CM | POA: Diagnosis not present

## 2021-03-11 DIAGNOSIS — M6281 Muscle weakness (generalized): Secondary | ICD-10-CM | POA: Diagnosis not present

## 2021-03-11 DIAGNOSIS — B372 Candidiasis of skin and nail: Secondary | ICD-10-CM | POA: Diagnosis not present

## 2021-03-11 DIAGNOSIS — R5383 Other fatigue: Secondary | ICD-10-CM | POA: Diagnosis not present

## 2021-03-11 DIAGNOSIS — R627 Adult failure to thrive: Secondary | ICD-10-CM | POA: Diagnosis not present

## 2021-03-11 DIAGNOSIS — L24A2 Irritant contact dermatitis due to fecal, urinary or dual incontinence: Secondary | ICD-10-CM | POA: Diagnosis not present

## 2021-03-13 DIAGNOSIS — R5383 Other fatigue: Secondary | ICD-10-CM | POA: Diagnosis not present

## 2021-03-13 DIAGNOSIS — U071 COVID-19: Secondary | ICD-10-CM | POA: Diagnosis not present

## 2021-03-13 DIAGNOSIS — F0390 Unspecified dementia without behavioral disturbance: Secondary | ICD-10-CM | POA: Diagnosis not present

## 2021-03-13 DIAGNOSIS — E1165 Type 2 diabetes mellitus with hyperglycemia: Secondary | ICD-10-CM | POA: Diagnosis not present

## 2021-03-18 DIAGNOSIS — B372 Candidiasis of skin and nail: Secondary | ICD-10-CM | POA: Diagnosis not present

## 2021-03-18 DIAGNOSIS — E785 Hyperlipidemia, unspecified: Secondary | ICD-10-CM | POA: Diagnosis not present

## 2021-03-19 DIAGNOSIS — E1165 Type 2 diabetes mellitus with hyperglycemia: Secondary | ICD-10-CM | POA: Diagnosis not present

## 2021-03-19 DIAGNOSIS — E038 Other specified hypothyroidism: Secondary | ICD-10-CM

## 2021-03-19 DIAGNOSIS — F01518 Vascular dementia, unspecified severity, with other behavioral disturbance: Secondary | ICD-10-CM

## 2021-03-22 ENCOUNTER — Telehealth: Payer: Self-pay | Admitting: *Deleted

## 2021-03-22 NOTE — Chronic Care Management (AMB) (Signed)
°  Care Management   Note  03/22/2021 Name: [name] MRN: [id number] DOB: 21-Jan-1936  [name] is a 86 y.o. year old female who is a primary care patient of Bedsole, Amy E, MD and is actively engaged with the care management team. I reached out to Hilma Favors by phone today to assist with re-scheduling a follow up visit with the Licensed Clinical Social Worker  Follow up plan: Unsuccessful telephone outreach attempt made. A HIPAA compliant phone message was left for the patient providing contact information and requesting a return call.   Julian Hy, [place] Management  Direct Dial: [phone]

## 2021-03-24 NOTE — Progress Notes (Signed)
CARDIOLOGY CONSULT NOTE       Patient ID: Jaclyn [name] MRN: [id number] DOB/AGE: 02-23-1935 86 y.o.  Admit date: (Not on file) Referring Physician: Diona Browner Primary Physician: Jinny Sanders, MD Primary Cardiologist: New Reason for Consultation: Afib  Active Problems:   * No active hospital problems. *   HPI:  86 y.o. referred by DR Diona Browner for Afib. I have not seen her in years. She has a history of HTN, HLD, DM PAF TIA and carotid disease Refused CEA initially but eventually had right CEA with DR EArly 08/26/19 She had left 40-59% carotid dx and this has not been f/u She has been in NSR for years but on eliquis TTE done 08/22/19 normal EF no significant valve disease   [facility] with FTT at SNF with urinary incontinence confusion worsening dementia poor PO intake Noted to have UTI, TSH 22 BS 336  MRI head non acute Rx with Seroquel and given iv synthroid Antibiotics for UTI and Myrbetriq for incontinence Lantus and SS insulin changed   Not clear how closely she is followed at SNF for thyroid and diabetes   She has significant dementia and rambles Her son is with her and he does not seem to have a good handle on her health issues and only sees her at SNF once/month   ROS All other systems reviewed and negative except as noted above  Past Medical History:  Diagnosis Date   Atrial fibrillation ([place])    Benign neoplasm of colon    Carotid artery occlusion    60-79% right ICA stenosis   Difficult intubation 02/17/2006   During surgery to remove large polyp   Diverticulosis of colon (without mention of hemorrhage)    Dysthymic disorder    Fibromyalgia    Headache(784.0)    Insomnia, unspecified    Interstitial cystitis    Osteoarthrosis, unspecified whether generalized or localized, unspecified site    Other and unspecified hyperlipidemia    Other chest pain    Other specified benign mammary dysplasias    TIA (transient ischemic attack)    Type II or  unspecified type diabetes mellitus without mention of complication, not stated as uncontrolled    Unspecified essential hypertension    Unspecified hypothyroidism    Unspecified vitamin D deficiency    Urinary tract infection, site not specified     Family History  Problem Relation Age of Onset   Lymphoma Father    Hypertension Father    Stroke Father    Hyperlipidemia Father    Hypertension Mother    Allergies Brother    Hypertension Sister        3   Breast cancer Sister    Fibromyalgia Sister        3   Hyperlipidemia Sister        3    Social History   Socioeconomic History   Marital status: Single    Spouse name: Not on file   Number of children: 3   Years of education: Not on file   Highest education level: Not on file  Occupational History   Occupation: retired    Fish farm manager: RETIRED  Tobacco Use   Smoking status: Never   Smokeless tobacco: Never  Substance and Sexual Activity   Alcohol use: No    Alcohol/week: 0.0 standard drinks   Drug use: No   Sexual activity: Not on file  Other Topics Concern   Not on file  Social History Narrative   1  child died at 79 months old --hx of downs syndrome  Lives with son in a 2 story home.  Uses the 1st floor.  Has two living children.  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     Social Determinants of Health   Financial Resource Strain: High Risk   Difficulty of Paying Living Expenses: Hard  Food Insecurity: Not on file  Transportation Needs: Not on file  Physical Activity: Not on file  Stress: Not on file  Social Connections: Not on file  Intimate Partner Violence: Not on file    Past Surgical History:  Procedure Laterality Date   ABDOMINAL HYSTERECTOMY  1988   cervical dysplasia   CARDIAC CATHETERIZATION  02/19/06   EF 60%   COLONOSCOPY     ENDARTERECTOMY Right 08/26/2019   Procedure: RIGHT CAROTID ENDARTERECTOMY;  Surgeon: Rosetta Posner, MD;  Location: [facility];  Service: Vascular;  Laterality: Right;   PATCH  ANGIOPLASTY Right 08/26/2019   Procedure: PATCH ANGIOPLASTY OF RIGHT COMMON CAROTID ARTERY USING HEMASHIELD PLATINUM FINESSE PATCH;  Surgeon: Rosetta Posner, MD;  Location: [facility] OR;  Service: Vascular;  Laterality: Right;   RIGHT COLECTOMY  2005   for villous adenoma of the cecum Dr.streck   VESICOVAGINAL FISTULA CLOSURE W/ [place]   w/ cystocele &retocele repairs Dr. Ree Edman      Current Outpatient Medications:    apixaban (ELIQUIS) 5 MG TABS tablet, TAKE 1 TABLET BY MOUTH TWICE A DAY (Patient taking differently: Take 5 mg by mouth 2 (two) times daily.), Disp: 60 tablet, Rfl: 2   B-D ULTRAFINE III SHORT PEN 31G X 8 MM MISC, USE TO INJECT INSULIN ONCE DAILY. DX: E11.65, Disp: 100 each, Rfl: 3   Cholecalciferol (VITAMIN D3) 1.25 MG (50000 UT) CAPS, Take 1 capsule by mouth once a week. (Patient taking differently: Take 50,000 Units by mouth once a week.), Disp: 12 capsule, Rfl: 0   DULoxetine (CYMBALTA) 30 MG capsule, TAKE 2 CAPSULES BY MOUTH EVERY DAY (Patient taking differently: Take 60 mg by mouth daily.), Disp: 180 capsule, Rfl: 1   insulin aspart (NOVOLOG) 100 UNIT/ML FlexPen, Before each meal 3 times a day, 140-199 - 2 units, 200-250 - 6 units, 251-299 - 8 units,  300-349 - 10 units,  350 or above 12 units., Disp: , Rfl:    insulin glargine (LANTUS SOLOSTAR) 100 UNIT/ML Solostar Pen, Inject 42 Units into the skin 2 (two) times daily., Disp: , Rfl: 0   JANUVIA 100 MG tablet, TAKE 1 TABLET BY MOUTH EVERY DAY (Patient taking differently: Take 100 mg by mouth daily.), Disp: 30 tablet, Rfl: 5   levothyroxine (SYNTHROID) 175 MCG tablet, Take 1 tablet (175 mcg total) by mouth daily., Disp: , Rfl:    trimethoprim (TRIMPEX) 100 MG tablet, TAKE 1 TABLET BY MOUTH EVERY DAY (Patient taking differently: Take 100 mg by mouth daily.), Disp: 90 tablet, Rfl: 3    Physical Exam: There were no vitals taken for this visit.    Affect appropriate Chronically ill female with dementia HEENT: normal Neck supple  with no adenopathy JVP normal no bruits no thyromegaly Lungs clear with no wheezing and good diaphragmatic motion Heart:  S1/S2 no murmur, no rub, gallop or click PMI normal Abdomen: benighn, BS positve, no tenderness, no AAA no bruit.  No HSM or HJR Distal pulses intact with no bruits No edema Neuro non-focal Skin warm and dry No muscular weakness   Labs:   Lab Results  Component Value Date  WBC 8.0 02/24/2021   HGB 10.5 (L) 02/24/2021   HCT 31.4 (L) 02/24/2021   MCV 95.2 02/24/2021   PLT 286 02/24/2021   No results for input(s): NA, K, CL, CO2, BUN, CREATININE, CALCIUM, PROT, BILITOT, ALKPHOS, ALT, AST, GLUCOSE in the last 168 hours.  Invalid input(s): LABALBU Lab Results  Component Value Date   TROPONINI <0.30 02/21/2013    Lab Results  Component Value Date   CHOL 204 (H) 08/22/2019   CHOL 239 (H) 09/06/2018   CHOL 161 06/20/2017   Lab Results  Component Value Date   HDL 33 (L) 08/22/2019   HDL 39.20 09/06/2018   HDL 35 (L) 06/20/2017   Lab Results  Component Value Date   LDLCALC 103 (H) 08/22/2019   LDLCALC 79 06/20/2017   LDLCALC 128 (H) 07/12/2015   Lab Results  Component Value Date   TRIG 338 (H) 08/22/2019   TRIG (H) 09/06/2018    438.0 Triglyceride is over 400; calculations on Lipids are invalid.   TRIG 237 (H) 06/20/2017   Lab Results  Component Value Date   CHOLHDL 6.2 08/22/2019   CHOLHDL 6 09/06/2018   CHOLHDL 4.6 06/20/2017   Lab Results  Component Value Date   LDLDIRECT 146.0 09/06/2018   LDLDIRECT 132.0 05/08/2017   LDLDIRECT 148.0 07/29/2016      Radiology: No results found.  EKG: SR LAD poor R wave progression  04/01/2021 SR rate 101 LAE poor R wave progression no acute changes    ASSESSMENT AND PLAN:   PAF:  not clear where/when this was documented has been in NSR and d/c on eliquis  Carotid:  post right CEA 08/26/19 with residual left ICA at that time 40-59% will update [name] F/U [facility] Thyroid:  synthroid dose adjusted TSH /T4  at SNF Dementia:  on seroquel unable to live independantly  Incontinence :  on Myrbetriq worse with UTI DM: poorly controlled insulin dose adjusted in hospital f/u SNF did not see A1c during recent hospitalization  Carotid Duplex F/U VVS F/U SNF/Primary  No need for cardiology f/u   Signed: [place] 03/24/2021, 4:57 PM

## 2021-03-29 ENCOUNTER — Telehealth: Payer: Medicare Other

## 2021-04-01 ENCOUNTER — Ambulatory Visit (INDEPENDENT_AMBULATORY_CARE_PROVIDER_SITE_OTHER): Payer: Medicare Other | Admitting: Cardiovascular Disease

## 2021-04-01 ENCOUNTER — Encounter: Payer: Self-pay | Admitting: Cardiovascular Disease

## 2021-04-01 ENCOUNTER — Other Ambulatory Visit: Payer: Self-pay

## 2021-04-01 VITALS — BP 112/72 | HR 101 | Ht 62.0 in

## 2021-04-01 DIAGNOSIS — I63239 Cerebral infarction due to unspecified occlusion or stenosis of unspecified carotid arteries: Secondary | ICD-10-CM

## 2021-04-01 DIAGNOSIS — I480 Paroxysmal atrial fibrillation: Secondary | ICD-10-CM | POA: Diagnosis not present

## 2021-04-01 DIAGNOSIS — E038 Other specified hypothyroidism: Secondary | ICD-10-CM | POA: Diagnosis not present

## 2021-04-01 DIAGNOSIS — F01511 Vascular dementia, unspecified severity, with agitation: Secondary | ICD-10-CM | POA: Diagnosis not present

## 2021-04-01 NOTE — Patient Instructions (Addendum)
Medication Instructions:  Your physician recommends that you continue on your current medications as directed. Please refer to the Current Medication list given to you today.  *If you need a refill on your cardiac medications before your next appointment, please call your pharmacy*  Lab Work: If you have labs (blood work) drawn today and your tests are completely normal, you will receive your results only by: [place] (if you have MyChart) OR A paper copy in the mail If you have any lab test that is abnormal or we need to change your treatment, we will call you to review the results.  Testing/Procedures: Your physician has requested that you have a carotid duplex. This test is an ultrasound of the carotid arteries in your neck. It looks at blood flow through these arteries that supply the brain with blood. Allow one hour for this exam. There are no restrictions or special instructions.  Follow-Up: At [facility], you and your health needs are our priority.  As part of our continuing mission to provide you with exceptional heart care, we have created designated Provider Care Teams.  These Care Teams include your primary Cardiologist (physician) and Advanced Practice Providers (APPs -  Physician Assistants and Nurse Practitioners) who all work together to provide you with the care you need, when you need it.  We recommend signing up for the patient portal called "MyChart".  Sign up information is provided on this After Visit Summary.  MyChart is used to connect with patients for Virtual Visits (Telemedicine).  Patients are able to view lab/test results, encounter notes, upcoming appointments, etc.  Non-urgent messages can be sent to your provider as well.   To learn more about what you can do with MyChart, go to [email or web address].    Your next appointment:   As needed  The format for your next appointment:   In Person  Provider:   Jenkins Rouge, MD {   Other  Instructions Please follow up with your primary care doctor and your vascular doctor.

## 2021-04-05 ENCOUNTER — Telehealth: Payer: Medicare Other

## 2021-04-05 DIAGNOSIS — Z8673 Personal history of transient ischemic attack (TIA), and cerebral infarction without residual deficits: Secondary | ICD-10-CM | POA: Diagnosis not present

## 2021-04-05 DIAGNOSIS — M6281 Muscle weakness (generalized): Secondary | ICD-10-CM | POA: Diagnosis not present

## 2021-04-05 DIAGNOSIS — Z1159 Encounter for screening for other viral diseases: Secondary | ICD-10-CM | POA: Diagnosis not present

## 2021-04-05 DIAGNOSIS — G928 Other toxic encephalopathy: Secondary | ICD-10-CM | POA: Diagnosis not present

## 2021-04-08 DIAGNOSIS — G928 Other toxic encephalopathy: Secondary | ICD-10-CM | POA: Diagnosis not present

## 2021-04-08 DIAGNOSIS — M6281 Muscle weakness (generalized): Secondary | ICD-10-CM | POA: Diagnosis not present

## 2021-04-08 DIAGNOSIS — Z1159 Encounter for screening for other viral diseases: Secondary | ICD-10-CM | POA: Diagnosis not present

## 2021-04-08 DIAGNOSIS — Z8673 Personal history of transient ischemic attack (TIA), and cerebral infarction without residual deficits: Secondary | ICD-10-CM | POA: Diagnosis not present

## 2021-04-09 DIAGNOSIS — M6281 Muscle weakness (generalized): Secondary | ICD-10-CM | POA: Diagnosis not present

## 2021-04-09 DIAGNOSIS — Z1159 Encounter for screening for other viral diseases: Secondary | ICD-10-CM | POA: Diagnosis not present

## 2021-04-09 DIAGNOSIS — Z8673 Personal history of transient ischemic attack (TIA), and cerebral infarction without residual deficits: Secondary | ICD-10-CM | POA: Diagnosis not present

## 2021-04-09 DIAGNOSIS — G928 Other toxic encephalopathy: Secondary | ICD-10-CM | POA: Diagnosis not present

## 2021-04-10 NOTE — Chronic Care Management (AMB) (Signed)
°  Care Management   Note  04/10/2021 Name: Jaclyn [name] MRN: [id number] DOB: May 31, 1935  Jaclyn [name] is a 86 y.o. year old female who is a primary care patient of Bedsole, Amy E, MD and is actively engaged with the care management team. I reached out to Hilma Favors by phone today to assist with re-scheduling a follow up visit with the Licensed Clinical Social Worker  Follow up plan: Per pt son [name] pt is at rehab facility in [place] and says pt has Education officer, museum at the facility and declined to schedule with LCSW at this time   Julian Hy, [place], [place]: [phone]

## 2021-04-11 DIAGNOSIS — M6281 Muscle weakness (generalized): Secondary | ICD-10-CM | POA: Diagnosis not present

## 2021-04-11 DIAGNOSIS — Z1159 Encounter for screening for other viral diseases: Secondary | ICD-10-CM | POA: Diagnosis not present

## 2021-04-11 DIAGNOSIS — Z8673 Personal history of transient ischemic attack (TIA), and cerebral infarction without residual deficits: Secondary | ICD-10-CM | POA: Diagnosis not present

## 2021-04-11 DIAGNOSIS — G928 Other toxic encephalopathy: Secondary | ICD-10-CM | POA: Diagnosis not present

## 2021-04-12 ENCOUNTER — Telehealth: Payer: Self-pay | Admitting: *Deleted

## 2021-04-12 DIAGNOSIS — M6281 Muscle weakness (generalized): Secondary | ICD-10-CM | POA: Diagnosis not present

## 2021-04-12 DIAGNOSIS — Z1159 Encounter for screening for other viral diseases: Secondary | ICD-10-CM | POA: Diagnosis not present

## 2021-04-12 DIAGNOSIS — G928 Other toxic encephalopathy: Secondary | ICD-10-CM | POA: Diagnosis not present

## 2021-04-12 DIAGNOSIS — Z8673 Personal history of transient ischemic attack (TIA), and cerebral infarction without residual deficits: Secondary | ICD-10-CM | POA: Diagnosis not present

## 2021-04-12 NOTE — Telephone Encounter (Signed)
CSW notified by Care Guide of pt/family declining further CCM involvment. CSW will complete Care Plan and remove self from [place] MSW, LCSW Licensed Clinical Social Worker [place]   [phone]

## 2021-04-13 DIAGNOSIS — Z8673 Personal history of transient ischemic attack (TIA), and cerebral infarction without residual deficits: Secondary | ICD-10-CM | POA: Diagnosis not present

## 2021-04-13 DIAGNOSIS — G928 Other toxic encephalopathy: Secondary | ICD-10-CM | POA: Diagnosis not present

## 2021-04-13 DIAGNOSIS — M6281 Muscle weakness (generalized): Secondary | ICD-10-CM | POA: Diagnosis not present

## 2021-04-13 DIAGNOSIS — Z1159 Encounter for screening for other viral diseases: Secondary | ICD-10-CM | POA: Diagnosis not present

## 2021-04-14 DIAGNOSIS — G928 Other toxic encephalopathy: Secondary | ICD-10-CM | POA: Diagnosis not present

## 2021-04-14 DIAGNOSIS — Z1159 Encounter for screening for other viral diseases: Secondary | ICD-10-CM | POA: Diagnosis not present

## 2021-04-14 DIAGNOSIS — M6281 Muscle weakness (generalized): Secondary | ICD-10-CM | POA: Diagnosis not present

## 2021-04-14 DIAGNOSIS — Z8673 Personal history of transient ischemic attack (TIA), and cerebral infarction without residual deficits: Secondary | ICD-10-CM | POA: Diagnosis not present

## 2021-04-16 DIAGNOSIS — Z1159 Encounter for screening for other viral diseases: Secondary | ICD-10-CM | POA: Diagnosis not present

## 2021-04-16 DIAGNOSIS — Z8673 Personal history of transient ischemic attack (TIA), and cerebral infarction without residual deficits: Secondary | ICD-10-CM | POA: Diagnosis not present

## 2021-04-16 DIAGNOSIS — M6281 Muscle weakness (generalized): Secondary | ICD-10-CM | POA: Diagnosis not present

## 2021-04-16 DIAGNOSIS — G928 Other toxic encephalopathy: Secondary | ICD-10-CM | POA: Diagnosis not present

## 2021-04-17 DIAGNOSIS — M6281 Muscle weakness (generalized): Secondary | ICD-10-CM | POA: Diagnosis not present

## 2021-04-17 DIAGNOSIS — Z1159 Encounter for screening for other viral diseases: Secondary | ICD-10-CM | POA: Diagnosis not present

## 2021-04-17 DIAGNOSIS — G928 Other toxic encephalopathy: Secondary | ICD-10-CM | POA: Diagnosis not present

## 2021-04-17 DIAGNOSIS — Z8673 Personal history of transient ischemic attack (TIA), and cerebral infarction without residual deficits: Secondary | ICD-10-CM | POA: Diagnosis not present

## 2021-04-18 DIAGNOSIS — Z8673 Personal history of transient ischemic attack (TIA), and cerebral infarction without residual deficits: Secondary | ICD-10-CM | POA: Diagnosis not present

## 2021-04-18 DIAGNOSIS — Z1159 Encounter for screening for other viral diseases: Secondary | ICD-10-CM | POA: Diagnosis not present

## 2021-04-18 DIAGNOSIS — M6281 Muscle weakness (generalized): Secondary | ICD-10-CM | POA: Diagnosis not present

## 2021-04-18 DIAGNOSIS — G928 Other toxic encephalopathy: Secondary | ICD-10-CM | POA: Diagnosis not present

## 2021-04-27 IMAGING — CT CT HEAD W/O CM
3 series · 15 of 47 positions shown, 18 images · non-contrast
Comparison: 06/19/2017

CLINICAL DATA: Altered level of consciousness.

EXAM:
CT HEAD WITHOUT CONTRAST
TECHNIQUE: Contiguous axial images were obtained from the base of the skull
through the vertex without intravenous contrast.

[Series 2: head wo · axial · 0.41mm/px · z∈[-176,-51]mm · 9 of 31 slices shown, 12 images]
[im 3/31  brain]
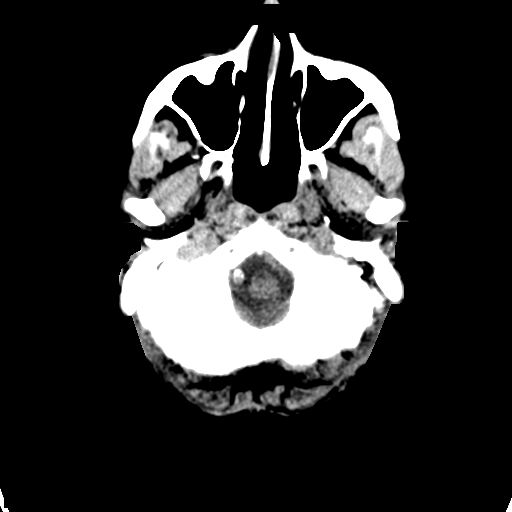
[im 3/31  bone]
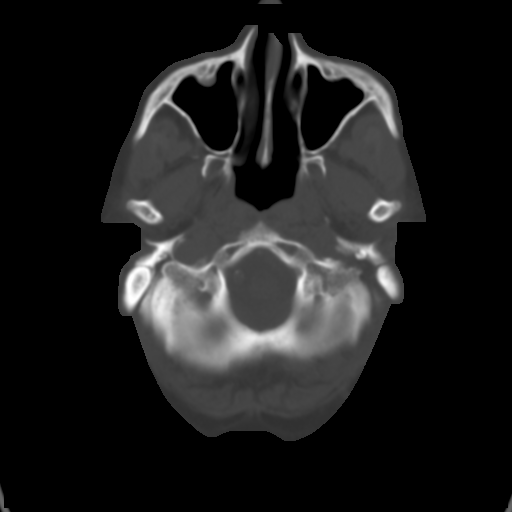
[im 6/31  brain]
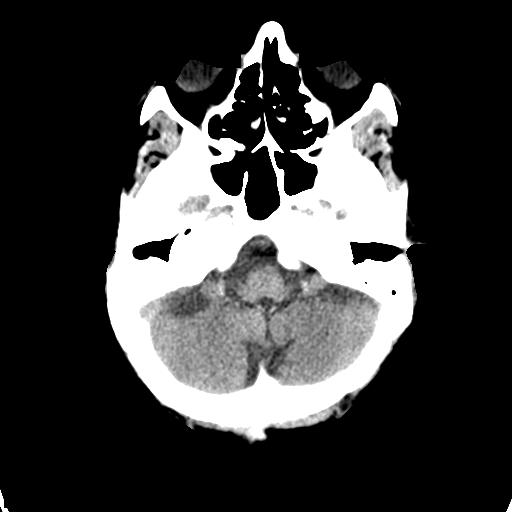
[im 9/31  brain]
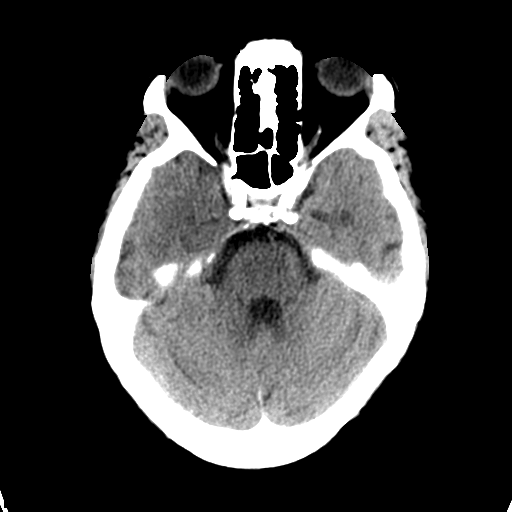
[im 12/31  brain]
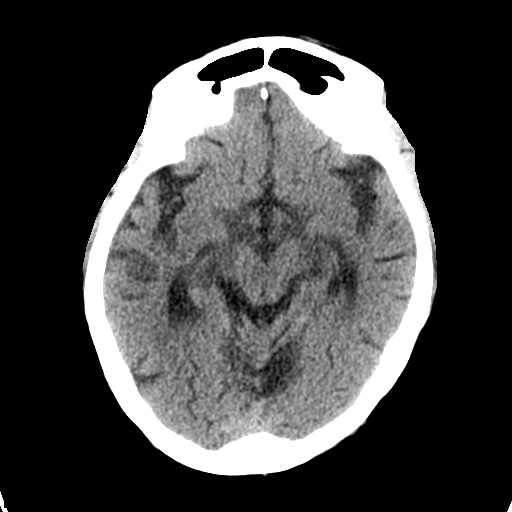
[im 16/31  brain]
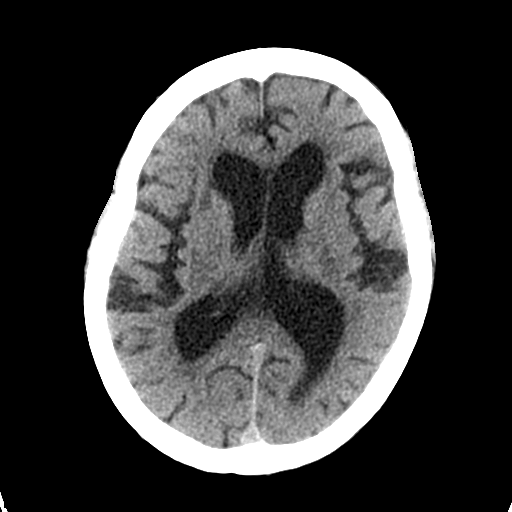
[im 16/31  bone]
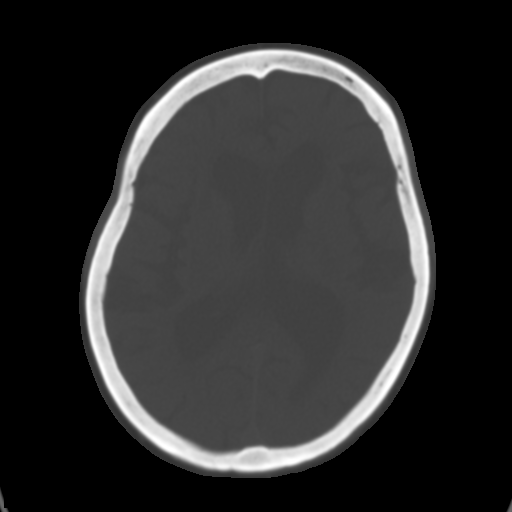
[im 19/31  brain]
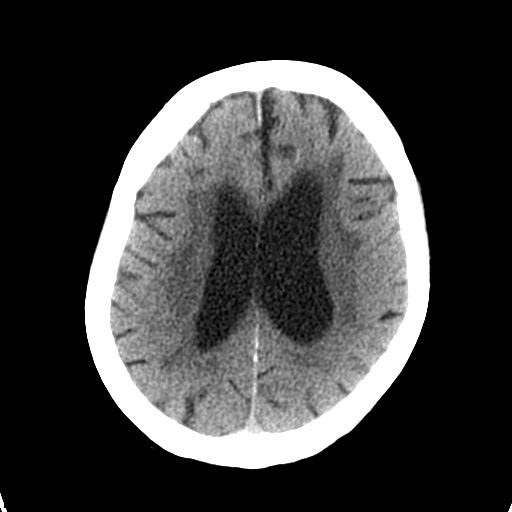
[im 22/31  brain]
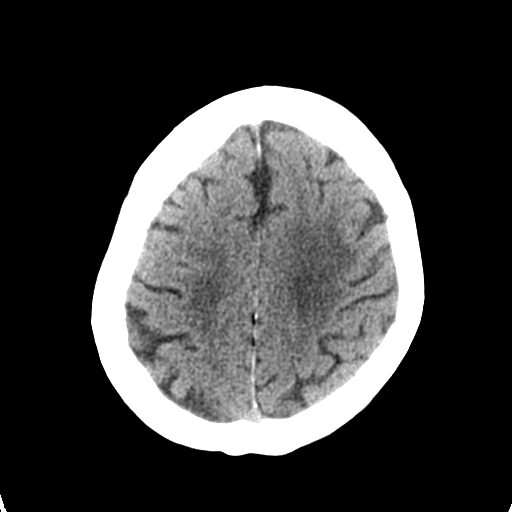
[im 25/31  brain]
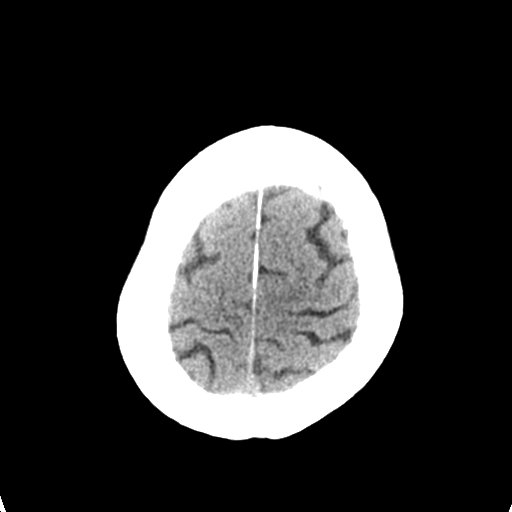
[im 28/31  brain]
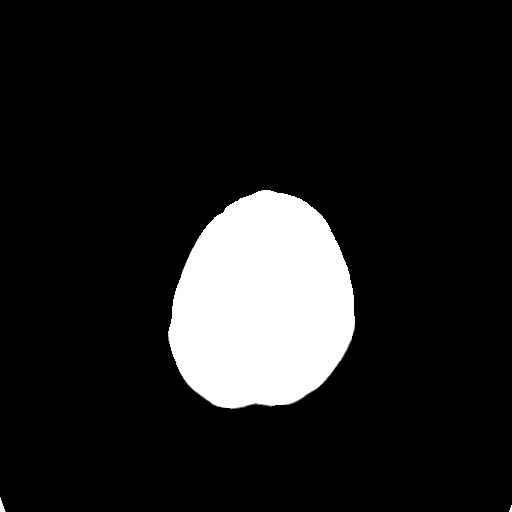
[im 28/31  bone]
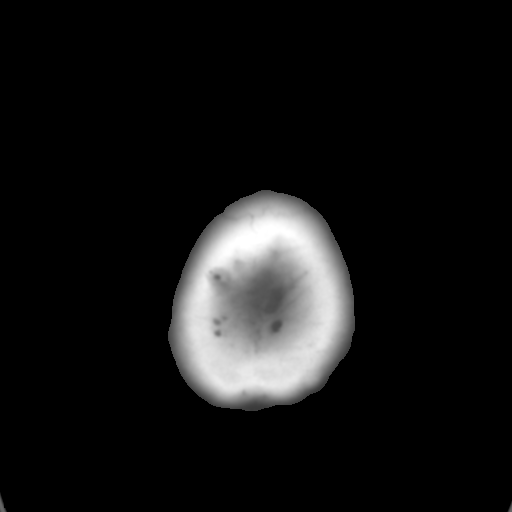

[Series 5: coronal soft tissue · coronal · 0.30mm/px · 3 of 67 slices shown]
[im 23/67  brain]
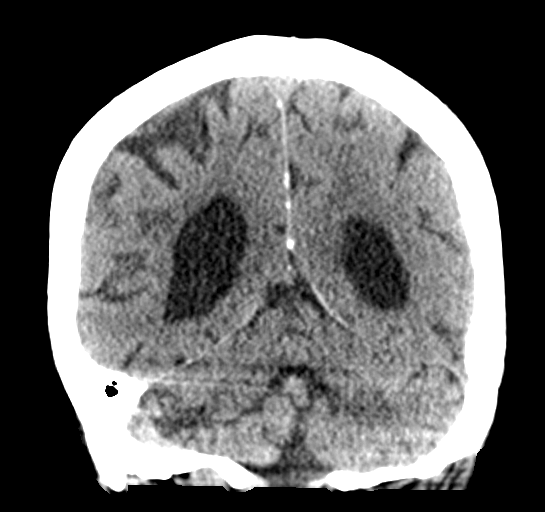
[im 30/67  brain]
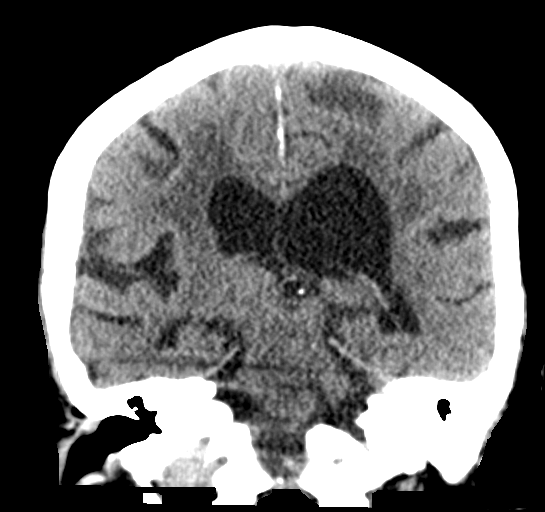
[im 37/67  brain]
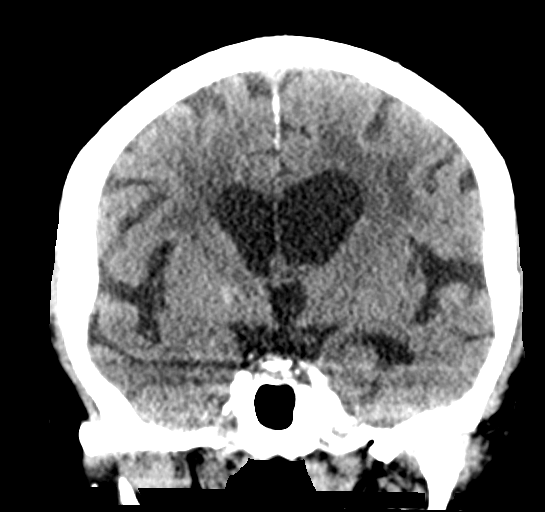

[Series 6: sagittal soft tissue · sagittal · 0.30mm/px · 3 of 52 slices shown]
[im 18/52  brain]
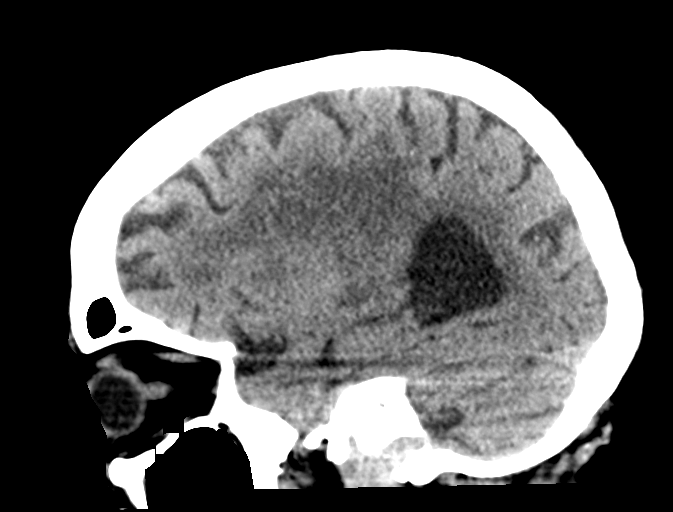
[im 26/52  brain]
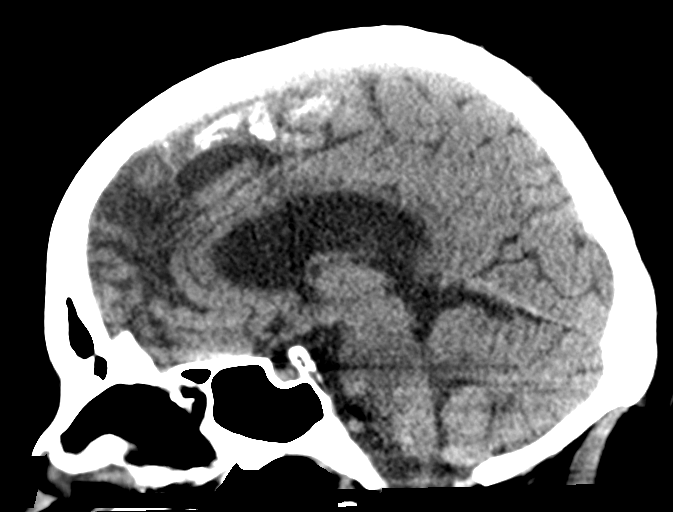
[im 35/52  brain]
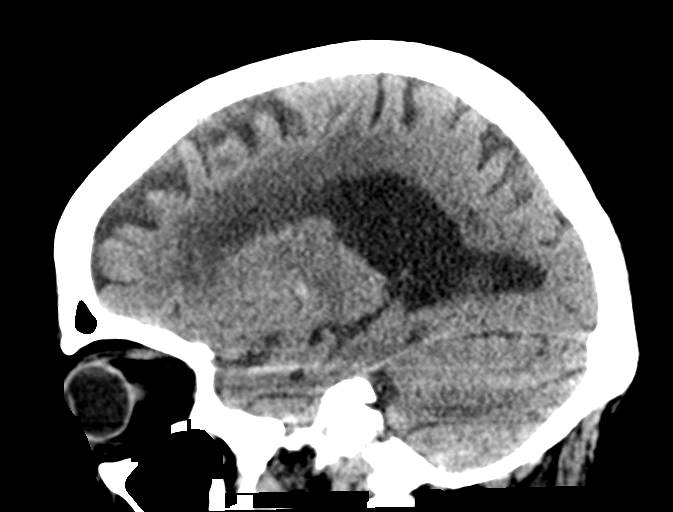

[15 of 47 positions shown; findings below may reference images not displayed]

FINDINGS: Brain: Stable age related cerebral atrophy, ventriculomegaly and
periventricular white matter disease. No extra-axial fluid
collections are identified. No CT findings for acute hemispheric
infarction or intracranial hemorrhage. No mass lesions. The
brainstem and cerebellum are normal.

Vascular: Vascular calcifications but no aneurysm or hyperdense
vessels.

Skull: No skull fracture or bone lesions.

Sinuses/Orbits: The paranasal sinuses are clear. The mastoid air
cells are chronically hypoaerated. The middle ear cavities are
clear.

Other: No scalp lesions or hematoma.
IMPRESSION: Stable cerebral atrophy, ventriculomegaly and periventricular white
matter disease.

No acute intracranial findings or mass lesions.

## 2021-05-02 ENCOUNTER — Telehealth: Payer: Self-pay

## 2021-05-02 NOTE — Chronic Care Management (AMB) (Signed)
? ? ?Chronic Care Management ?Pharmacy Assistant  ? ?Name: Jaclyn [name]  MRN: [id number] DOB: Jun 24, 1935 ? ? ?Reason for Encounter:General Adherence Disease State ?  ?Recent office visits:  ?02/06/21-Family Medicine- Social worker call.Identified Jaclyn [name] unaffordaable, CCM working on PAP. ? ?Recent consult visits:  ?04/01/21-Cardiology-Jaclyn Nishan,MD-Follow up Afib.No need for cardiology f/u  ? ?Hospital visits:  ?Medication Reconciliation was completed by comparing discharge summary, patient?s EMR and Pharmacy list, and upon discussion with patient. ? ?Admitted to the hospital on 02/15/21 due to Failure to thrive. Discharge date was 02/25/21. Discharged from [facility].   ? ?New?Medications Started at [facility] Discharge:?? ?-started Novolog  ? ?Medication Changes at Hospital Discharge: ?-Changed Lantus , Levothyroxine ? ?Medications that remain the same after Hospital Discharge:?? SNF ?-All other medications will remain the same.   ? ?Medications: ?Outpatient Encounter Medications as of 05/02/2021  ?Medication Sig  ? apixaban (Jaclyn [name]) 5 MG TABS tablet TAKE 1 TABLET BY MOUTH TWICE A DAY (Patient taking differently: Take 5 mg by mouth 2 (two) times daily.)  ? B-D ULTRAFINE III SHORT PEN 31G X 8 MM MISC USE TO INJECT INSULIN ONCE DAILY. DX: E11.65  ? Cholecalciferol (VITAMIN D3) 1.25 MG (50000 UT) CAPS Take 1 capsule by mouth once a week. (Patient taking differently: Take 50,000 Units by mouth once a week.)  ? DULoxetine (CYMBALTA) 30 MG capsule TAKE 2 CAPSULES BY MOUTH EVERY DAY (Patient taking differently: Take 60 mg by mouth daily.)  ? HUMALOG 100 UNIT/ML injection Inject into the skin as directed.  ? insulin aspart (NOVOLOG) 100 UNIT/ML FlexPen Before each meal 3 times a day, 140-199 - 2 units, 200-250 - 6 units, 251-299 - 8 units,  300-349 - 10 units,  350 or above 12 units.  ? insulin glargine (LANTUS SOLOSTAR) 100 UNIT/ML Solostar Pen Inject 42 Units into the skin 2 (two) times daily.  ? JANUVIA 100 MG  tablet TAKE 1 TABLET BY MOUTH EVERY DAY (Patient taking differently: Take 100 mg by mouth daily.)  ? levothyroxine (SYNTHROID) 175 MCG tablet Take 1 tablet (175 mcg total) by mouth daily.  ? trimethoprim (TRIMPEX) 100 MG tablet TAKE 1 TABLET BY MOUTH EVERY DAY (Patient taking differently: Take 100 mg by mouth daily.)  ? ?No facility-administered encounter medications on file as of 05/02/2021.  ? ? ? ? ?Contacted Jaclyn [name] on 05/03/21 for general disease state and medication adherence call. Talked to son Jaclyn [name]  ? ?Patient is not more than 5 days past due for refill on the following medications per chart history: Patient in SNF ? ?[facility] Medications: ?Medication Name/mg Last Fill Days Supply ?Humalog 100unit/ml  03/29/21 20 ?Januvia '100mg'$   04/09/21 14 ?Novolog 100unit/ml ? ? ?What concerns do you have about your medications? Patient currently resides in SNF, per son they are giving her daily medications ? ?The patient denies side effects with their medications.  ? ?How often do you forget or accidentally miss a dose? Never- SNF administering medications ? ? ?Are you having any problems getting your medications from your pharmacy? No ? ?Has the cost of your medications been a concern? No ? ? ?Since last visit with [name], the following interventions have been made. SNF placement  ? ?The patient has had an ED visit since last contact. 12/20/22Gershon [name] -Failure to thrive-admission and discharge to SNF  ? ?The patient denies problems with their health . Son reports patient appears healthy and happy. She has neurologic deficits.  ? ?Patient denies concerns or questions for [facility],  PharmD at this time.  ? ?Counseled patient on:  ?Access to CCM team for any cost, medication or pharmacy concerns. ? ? ?Care Gaps: ?Annual wellness visit in last year? No ?Most Recent BP reading:112/72 101-P  04/01/21 ? ?If Diabetic: ?Most recent A1C reading:14.4 12/25/20 ?Last eye exam / retinopathy screening:2015 ?Last diabetic foot  exam:2019 ? ?Upcoming appointments: ?No appointments scheduled within the next 30 days. ? ? ?Jaclyn [name], [name] notified ? ?Jaclyn [name], CCMA ?Health concierge  ?[phone]  ?

## 2021-05-03 ENCOUNTER — Encounter (HOSPITAL_COMMUNITY): Payer: Medicare Other

## 2021-05-08 DIAGNOSIS — R4701 Aphasia: Secondary | ICD-10-CM | POA: Diagnosis not present

## 2021-05-08 DIAGNOSIS — F05 Delirium due to known physiological condition: Secondary | ICD-10-CM | POA: Diagnosis not present

## 2021-05-08 DIAGNOSIS — R627 Adult failure to thrive: Secondary | ICD-10-CM | POA: Diagnosis not present

## 2021-05-08 DIAGNOSIS — R270 Ataxia, unspecified: Secondary | ICD-10-CM | POA: Diagnosis not present

## 2021-05-10 ENCOUNTER — Encounter (HOSPITAL_COMMUNITY): Payer: Medicare Other

## 2021-05-10 DIAGNOSIS — F0390 Unspecified dementia without behavioral disturbance: Secondary | ICD-10-CM | POA: Diagnosis not present

## 2021-05-10 DIAGNOSIS — R279 Unspecified lack of coordination: Secondary | ICD-10-CM | POA: Diagnosis not present

## 2021-05-10 DIAGNOSIS — M6281 Muscle weakness (generalized): Secondary | ICD-10-CM | POA: Diagnosis not present

## 2021-05-10 DIAGNOSIS — R2689 Other abnormalities of gait and mobility: Secondary | ICD-10-CM | POA: Diagnosis not present

## 2021-05-10 DIAGNOSIS — R251 Tremor, unspecified: Secondary | ICD-10-CM | POA: Diagnosis not present

## 2021-05-10 DIAGNOSIS — I480 Paroxysmal atrial fibrillation: Secondary | ICD-10-CM | POA: Diagnosis not present

## 2021-05-10 DIAGNOSIS — M797 Fibromyalgia: Secondary | ICD-10-CM | POA: Diagnosis not present

## 2021-05-10 DIAGNOSIS — S728X1D Other fracture of right femur, subsequent encounter for closed fracture with routine healing: Secondary | ICD-10-CM | POA: Diagnosis not present

## 2021-05-10 DIAGNOSIS — R41841 Cognitive communication deficit: Secondary | ICD-10-CM | POA: Diagnosis not present

## 2021-05-12 DIAGNOSIS — M797 Fibromyalgia: Secondary | ICD-10-CM | POA: Diagnosis not present

## 2021-05-12 DIAGNOSIS — R279 Unspecified lack of coordination: Secondary | ICD-10-CM | POA: Diagnosis not present

## 2021-05-12 DIAGNOSIS — S728X1D Other fracture of right femur, subsequent encounter for closed fracture with routine healing: Secondary | ICD-10-CM | POA: Diagnosis not present

## 2021-05-12 DIAGNOSIS — R251 Tremor, unspecified: Secondary | ICD-10-CM | POA: Diagnosis not present

## 2021-05-12 DIAGNOSIS — I480 Paroxysmal atrial fibrillation: Secondary | ICD-10-CM | POA: Diagnosis not present

## 2021-05-12 DIAGNOSIS — F0390 Unspecified dementia without behavioral disturbance: Secondary | ICD-10-CM | POA: Diagnosis not present

## 2021-05-12 DIAGNOSIS — R2689 Other abnormalities of gait and mobility: Secondary | ICD-10-CM | POA: Diagnosis not present

## 2021-05-12 DIAGNOSIS — R41841 Cognitive communication deficit: Secondary | ICD-10-CM | POA: Diagnosis not present

## 2021-05-12 DIAGNOSIS — M6281 Muscle weakness (generalized): Secondary | ICD-10-CM | POA: Diagnosis not present

## 2021-05-13 DIAGNOSIS — M6281 Muscle weakness (generalized): Secondary | ICD-10-CM | POA: Diagnosis not present

## 2021-05-13 DIAGNOSIS — S728X1D Other fracture of right femur, subsequent encounter for closed fracture with routine healing: Secondary | ICD-10-CM | POA: Diagnosis not present

## 2021-05-13 DIAGNOSIS — R41841 Cognitive communication deficit: Secondary | ICD-10-CM | POA: Diagnosis not present

## 2021-05-13 DIAGNOSIS — I480 Paroxysmal atrial fibrillation: Secondary | ICD-10-CM | POA: Diagnosis not present

## 2021-05-13 DIAGNOSIS — M797 Fibromyalgia: Secondary | ICD-10-CM | POA: Diagnosis not present

## 2021-05-13 DIAGNOSIS — F0390 Unspecified dementia without behavioral disturbance: Secondary | ICD-10-CM | POA: Diagnosis not present

## 2021-05-13 DIAGNOSIS — R279 Unspecified lack of coordination: Secondary | ICD-10-CM | POA: Diagnosis not present

## 2021-05-13 DIAGNOSIS — R2689 Other abnormalities of gait and mobility: Secondary | ICD-10-CM | POA: Diagnosis not present

## 2021-05-13 DIAGNOSIS — R251 Tremor, unspecified: Secondary | ICD-10-CM | POA: Diagnosis not present

## 2021-05-14 DIAGNOSIS — R251 Tremor, unspecified: Secondary | ICD-10-CM | POA: Diagnosis not present

## 2021-05-14 DIAGNOSIS — R279 Unspecified lack of coordination: Secondary | ICD-10-CM | POA: Diagnosis not present

## 2021-05-14 DIAGNOSIS — M6281 Muscle weakness (generalized): Secondary | ICD-10-CM | POA: Diagnosis not present

## 2021-05-14 DIAGNOSIS — F0390 Unspecified dementia without behavioral disturbance: Secondary | ICD-10-CM | POA: Diagnosis not present

## 2021-05-14 DIAGNOSIS — R41841 Cognitive communication deficit: Secondary | ICD-10-CM | POA: Diagnosis not present

## 2021-05-14 DIAGNOSIS — S728X1D Other fracture of right femur, subsequent encounter for closed fracture with routine healing: Secondary | ICD-10-CM | POA: Diagnosis not present

## 2021-05-14 DIAGNOSIS — I480 Paroxysmal atrial fibrillation: Secondary | ICD-10-CM | POA: Diagnosis not present

## 2021-05-14 DIAGNOSIS — M797 Fibromyalgia: Secondary | ICD-10-CM | POA: Diagnosis not present

## 2021-05-14 DIAGNOSIS — R2689 Other abnormalities of gait and mobility: Secondary | ICD-10-CM | POA: Diagnosis not present

## 2021-05-15 DIAGNOSIS — R251 Tremor, unspecified: Secondary | ICD-10-CM | POA: Diagnosis not present

## 2021-05-15 DIAGNOSIS — M797 Fibromyalgia: Secondary | ICD-10-CM | POA: Diagnosis not present

## 2021-05-15 DIAGNOSIS — I480 Paroxysmal atrial fibrillation: Secondary | ICD-10-CM | POA: Diagnosis not present

## 2021-05-15 DIAGNOSIS — S728X1D Other fracture of right femur, subsequent encounter for closed fracture with routine healing: Secondary | ICD-10-CM | POA: Diagnosis not present

## 2021-05-15 DIAGNOSIS — R41841 Cognitive communication deficit: Secondary | ICD-10-CM | POA: Diagnosis not present

## 2021-05-15 DIAGNOSIS — R279 Unspecified lack of coordination: Secondary | ICD-10-CM | POA: Diagnosis not present

## 2021-05-15 DIAGNOSIS — R2689 Other abnormalities of gait and mobility: Secondary | ICD-10-CM | POA: Diagnosis not present

## 2021-05-15 DIAGNOSIS — F0390 Unspecified dementia without behavioral disturbance: Secondary | ICD-10-CM | POA: Diagnosis not present

## 2021-05-15 DIAGNOSIS — M6281 Muscle weakness (generalized): Secondary | ICD-10-CM | POA: Diagnosis not present

## 2021-05-16 DIAGNOSIS — M6281 Muscle weakness (generalized): Secondary | ICD-10-CM | POA: Diagnosis not present

## 2021-05-16 DIAGNOSIS — S728X1D Other fracture of right femur, subsequent encounter for closed fracture with routine healing: Secondary | ICD-10-CM | POA: Diagnosis not present

## 2021-05-16 DIAGNOSIS — R279 Unspecified lack of coordination: Secondary | ICD-10-CM | POA: Diagnosis not present

## 2021-05-16 DIAGNOSIS — R251 Tremor, unspecified: Secondary | ICD-10-CM | POA: Diagnosis not present

## 2021-05-16 DIAGNOSIS — I480 Paroxysmal atrial fibrillation: Secondary | ICD-10-CM | POA: Diagnosis not present

## 2021-05-16 DIAGNOSIS — R2689 Other abnormalities of gait and mobility: Secondary | ICD-10-CM | POA: Diagnosis not present

## 2021-05-16 DIAGNOSIS — F0390 Unspecified dementia without behavioral disturbance: Secondary | ICD-10-CM | POA: Diagnosis not present

## 2021-05-16 DIAGNOSIS — R41841 Cognitive communication deficit: Secondary | ICD-10-CM | POA: Diagnosis not present

## 2021-05-16 DIAGNOSIS — M797 Fibromyalgia: Secondary | ICD-10-CM | POA: Diagnosis not present

## 2021-05-17 DIAGNOSIS — R2689 Other abnormalities of gait and mobility: Secondary | ICD-10-CM | POA: Diagnosis not present

## 2021-05-17 DIAGNOSIS — M6281 Muscle weakness (generalized): Secondary | ICD-10-CM | POA: Diagnosis not present

## 2021-05-17 DIAGNOSIS — R41841 Cognitive communication deficit: Secondary | ICD-10-CM | POA: Diagnosis not present

## 2021-05-17 DIAGNOSIS — I480 Paroxysmal atrial fibrillation: Secondary | ICD-10-CM | POA: Diagnosis not present

## 2021-05-17 DIAGNOSIS — M797 Fibromyalgia: Secondary | ICD-10-CM | POA: Diagnosis not present

## 2021-05-17 DIAGNOSIS — R279 Unspecified lack of coordination: Secondary | ICD-10-CM | POA: Diagnosis not present

## 2021-05-17 DIAGNOSIS — F0390 Unspecified dementia without behavioral disturbance: Secondary | ICD-10-CM | POA: Diagnosis not present

## 2021-05-17 DIAGNOSIS — R251 Tremor, unspecified: Secondary | ICD-10-CM | POA: Diagnosis not present

## 2021-05-17 DIAGNOSIS — S728X1D Other fracture of right femur, subsequent encounter for closed fracture with routine healing: Secondary | ICD-10-CM | POA: Diagnosis not present

## 2021-05-18 DIAGNOSIS — S728X1D Other fracture of right femur, subsequent encounter for closed fracture with routine healing: Secondary | ICD-10-CM | POA: Diagnosis not present

## 2021-05-18 DIAGNOSIS — R251 Tremor, unspecified: Secondary | ICD-10-CM | POA: Diagnosis not present

## 2021-05-18 DIAGNOSIS — R2689 Other abnormalities of gait and mobility: Secondary | ICD-10-CM | POA: Diagnosis not present

## 2021-05-18 DIAGNOSIS — I480 Paroxysmal atrial fibrillation: Secondary | ICD-10-CM | POA: Diagnosis not present

## 2021-05-18 DIAGNOSIS — M6281 Muscle weakness (generalized): Secondary | ICD-10-CM | POA: Diagnosis not present

## 2021-05-18 DIAGNOSIS — F0390 Unspecified dementia without behavioral disturbance: Secondary | ICD-10-CM | POA: Diagnosis not present

## 2021-05-18 DIAGNOSIS — R279 Unspecified lack of coordination: Secondary | ICD-10-CM | POA: Diagnosis not present

## 2021-05-18 DIAGNOSIS — R41841 Cognitive communication deficit: Secondary | ICD-10-CM | POA: Diagnosis not present

## 2021-05-18 DIAGNOSIS — M797 Fibromyalgia: Secondary | ICD-10-CM | POA: Diagnosis not present

## 2021-05-20 DIAGNOSIS — I480 Paroxysmal atrial fibrillation: Secondary | ICD-10-CM | POA: Diagnosis not present

## 2021-05-20 DIAGNOSIS — S728X1D Other fracture of right femur, subsequent encounter for closed fracture with routine healing: Secondary | ICD-10-CM | POA: Diagnosis not present

## 2021-05-20 DIAGNOSIS — R251 Tremor, unspecified: Secondary | ICD-10-CM | POA: Diagnosis not present

## 2021-05-20 DIAGNOSIS — R41841 Cognitive communication deficit: Secondary | ICD-10-CM | POA: Diagnosis not present

## 2021-05-20 DIAGNOSIS — F0390 Unspecified dementia without behavioral disturbance: Secondary | ICD-10-CM | POA: Diagnosis not present

## 2021-05-20 DIAGNOSIS — M797 Fibromyalgia: Secondary | ICD-10-CM | POA: Diagnosis not present

## 2021-05-20 DIAGNOSIS — M6281 Muscle weakness (generalized): Secondary | ICD-10-CM | POA: Diagnosis not present

## 2021-05-20 DIAGNOSIS — R279 Unspecified lack of coordination: Secondary | ICD-10-CM | POA: Diagnosis not present

## 2021-05-20 DIAGNOSIS — R2689 Other abnormalities of gait and mobility: Secondary | ICD-10-CM | POA: Diagnosis not present

## 2021-05-21 DIAGNOSIS — S728X1D Other fracture of right femur, subsequent encounter for closed fracture with routine healing: Secondary | ICD-10-CM | POA: Diagnosis not present

## 2021-05-21 DIAGNOSIS — F0390 Unspecified dementia without behavioral disturbance: Secondary | ICD-10-CM | POA: Diagnosis not present

## 2021-05-21 DIAGNOSIS — R2689 Other abnormalities of gait and mobility: Secondary | ICD-10-CM | POA: Diagnosis not present

## 2021-05-21 DIAGNOSIS — M797 Fibromyalgia: Secondary | ICD-10-CM | POA: Diagnosis not present

## 2021-05-21 DIAGNOSIS — R279 Unspecified lack of coordination: Secondary | ICD-10-CM | POA: Diagnosis not present

## 2021-05-21 DIAGNOSIS — M6281 Muscle weakness (generalized): Secondary | ICD-10-CM | POA: Diagnosis not present

## 2021-05-21 DIAGNOSIS — R41841 Cognitive communication deficit: Secondary | ICD-10-CM | POA: Diagnosis not present

## 2021-05-21 DIAGNOSIS — R251 Tremor, unspecified: Secondary | ICD-10-CM | POA: Diagnosis not present

## 2021-05-21 DIAGNOSIS — I480 Paroxysmal atrial fibrillation: Secondary | ICD-10-CM | POA: Diagnosis not present

## 2021-05-22 DIAGNOSIS — I480 Paroxysmal atrial fibrillation: Secondary | ICD-10-CM | POA: Diagnosis not present

## 2021-05-22 DIAGNOSIS — F0390 Unspecified dementia without behavioral disturbance: Secondary | ICD-10-CM | POA: Diagnosis not present

## 2021-05-22 DIAGNOSIS — M797 Fibromyalgia: Secondary | ICD-10-CM | POA: Diagnosis not present

## 2021-05-22 DIAGNOSIS — R279 Unspecified lack of coordination: Secondary | ICD-10-CM | POA: Diagnosis not present

## 2021-05-22 DIAGNOSIS — R2689 Other abnormalities of gait and mobility: Secondary | ICD-10-CM | POA: Diagnosis not present

## 2021-05-22 DIAGNOSIS — R41841 Cognitive communication deficit: Secondary | ICD-10-CM | POA: Diagnosis not present

## 2021-05-22 DIAGNOSIS — M6281 Muscle weakness (generalized): Secondary | ICD-10-CM | POA: Diagnosis not present

## 2021-05-22 DIAGNOSIS — S728X1D Other fracture of right femur, subsequent encounter for closed fracture with routine healing: Secondary | ICD-10-CM | POA: Diagnosis not present

## 2021-05-22 DIAGNOSIS — R251 Tremor, unspecified: Secondary | ICD-10-CM | POA: Diagnosis not present

## 2021-05-23 DIAGNOSIS — I480 Paroxysmal atrial fibrillation: Secondary | ICD-10-CM | POA: Diagnosis not present

## 2021-05-23 DIAGNOSIS — R2689 Other abnormalities of gait and mobility: Secondary | ICD-10-CM | POA: Diagnosis not present

## 2021-05-23 DIAGNOSIS — M6281 Muscle weakness (generalized): Secondary | ICD-10-CM | POA: Diagnosis not present

## 2021-05-23 DIAGNOSIS — F0390 Unspecified dementia without behavioral disturbance: Secondary | ICD-10-CM | POA: Diagnosis not present

## 2021-05-23 DIAGNOSIS — R251 Tremor, unspecified: Secondary | ICD-10-CM | POA: Diagnosis not present

## 2021-05-23 DIAGNOSIS — R279 Unspecified lack of coordination: Secondary | ICD-10-CM | POA: Diagnosis not present

## 2021-05-23 DIAGNOSIS — S728X1D Other fracture of right femur, subsequent encounter for closed fracture with routine healing: Secondary | ICD-10-CM | POA: Diagnosis not present

## 2021-05-23 DIAGNOSIS — M797 Fibromyalgia: Secondary | ICD-10-CM | POA: Diagnosis not present

## 2021-05-23 DIAGNOSIS — R41841 Cognitive communication deficit: Secondary | ICD-10-CM | POA: Diagnosis not present

## 2021-05-24 DIAGNOSIS — S728X1D Other fracture of right femur, subsequent encounter for closed fracture with routine healing: Secondary | ICD-10-CM | POA: Diagnosis not present

## 2021-05-24 DIAGNOSIS — F0390 Unspecified dementia without behavioral disturbance: Secondary | ICD-10-CM | POA: Diagnosis not present

## 2021-05-24 DIAGNOSIS — R2689 Other abnormalities of gait and mobility: Secondary | ICD-10-CM | POA: Diagnosis not present

## 2021-05-24 DIAGNOSIS — M6281 Muscle weakness (generalized): Secondary | ICD-10-CM | POA: Diagnosis not present

## 2021-05-24 DIAGNOSIS — R251 Tremor, unspecified: Secondary | ICD-10-CM | POA: Diagnosis not present

## 2021-05-24 DIAGNOSIS — R41841 Cognitive communication deficit: Secondary | ICD-10-CM | POA: Diagnosis not present

## 2021-05-24 DIAGNOSIS — R279 Unspecified lack of coordination: Secondary | ICD-10-CM | POA: Diagnosis not present

## 2021-05-24 DIAGNOSIS — I480 Paroxysmal atrial fibrillation: Secondary | ICD-10-CM | POA: Diagnosis not present

## 2021-05-24 DIAGNOSIS — M797 Fibromyalgia: Secondary | ICD-10-CM | POA: Diagnosis not present

## 2021-05-27 ENCOUNTER — Encounter (HOSPITAL_COMMUNITY): Payer: Self-pay

## 2021-05-27 ENCOUNTER — Emergency Department (HOSPITAL_COMMUNITY)
Admission: EM | Admit: 2021-05-27 | Discharge: 2021-05-28 | Disposition: A | Discharge status: Home / Self Care | Payer: Medicare Other | Attending: Emergency Medicine | Admitting: Emergency Medicine

## 2021-05-27 ENCOUNTER — Emergency Department (HOSPITAL_COMMUNITY): Payer: Medicare Other

## 2021-05-27 ENCOUNTER — Other Ambulatory Visit: Payer: Self-pay

## 2021-05-27 DIAGNOSIS — R279 Unspecified lack of coordination: Secondary | ICD-10-CM | POA: Diagnosis not present

## 2021-05-27 DIAGNOSIS — N390 Urinary tract infection, site not specified: Secondary | ICD-10-CM

## 2021-05-27 DIAGNOSIS — E039 Hypothyroidism, unspecified: Secondary | ICD-10-CM | POA: Diagnosis not present

## 2021-05-27 DIAGNOSIS — R2689 Other abnormalities of gait and mobility: Secondary | ICD-10-CM | POA: Diagnosis not present

## 2021-05-27 DIAGNOSIS — Z7984 Long term (current) use of oral hypoglycemic drugs: Secondary | ICD-10-CM | POA: Diagnosis not present

## 2021-05-27 DIAGNOSIS — F0390 Unspecified dementia without behavioral disturbance: Secondary | ICD-10-CM | POA: Diagnosis not present

## 2021-05-27 DIAGNOSIS — R41841 Cognitive communication deficit: Secondary | ICD-10-CM | POA: Diagnosis not present

## 2021-05-27 DIAGNOSIS — R531 Weakness: Secondary | ICD-10-CM | POA: Diagnosis not present

## 2021-05-27 DIAGNOSIS — R4781 Slurred speech: Secondary | ICD-10-CM | POA: Diagnosis not present

## 2021-05-27 DIAGNOSIS — Z91040 Latex allergy status: Secondary | ICD-10-CM | POA: Diagnosis not present

## 2021-05-27 DIAGNOSIS — E119 Type 2 diabetes mellitus without complications: Secondary | ICD-10-CM | POA: Diagnosis not present

## 2021-05-27 DIAGNOSIS — I4891 Unspecified atrial fibrillation: Secondary | ICD-10-CM | POA: Diagnosis not present

## 2021-05-27 DIAGNOSIS — Z7901 Long term (current) use of anticoagulants: Secondary | ICD-10-CM | POA: Insufficient documentation

## 2021-05-27 DIAGNOSIS — S728X1D Other fracture of right femur, subsequent encounter for closed fracture with routine healing: Secondary | ICD-10-CM | POA: Diagnosis not present

## 2021-05-27 DIAGNOSIS — M6281 Muscle weakness (generalized): Secondary | ICD-10-CM | POA: Diagnosis not present

## 2021-05-27 DIAGNOSIS — I480 Paroxysmal atrial fibrillation: Secondary | ICD-10-CM | POA: Diagnosis not present

## 2021-05-27 DIAGNOSIS — Z794 Long term (current) use of insulin: Secondary | ICD-10-CM | POA: Diagnosis not present

## 2021-05-27 DIAGNOSIS — R410 Disorientation, unspecified: Secondary | ICD-10-CM | POA: Diagnosis not present

## 2021-05-27 DIAGNOSIS — M797 Fibromyalgia: Secondary | ICD-10-CM | POA: Diagnosis not present

## 2021-05-27 DIAGNOSIS — R4182 Altered mental status, unspecified: Secondary | ICD-10-CM | POA: Insufficient documentation

## 2021-05-27 DIAGNOSIS — R251 Tremor, unspecified: Secondary | ICD-10-CM | POA: Diagnosis not present

## 2021-05-27 LAB — COMPREHENSIVE METABOLIC PANEL
ALT: 18 U/L (ref 0–44)
AST: 20 U/L (ref 15–41)
Albumin: 3.2 g/dL — ABNORMAL LOW (ref 3.5–5.0)
Alkaline Phosphatase: 73 U/L (ref 38–126)
Anion gap: 12 (ref 5–15)
BUN: 13 mg/dL (ref 8–23)
CO2: 24 mmol/L (ref 22–32)
Calcium: 9.5 mg/dL (ref 8.9–10.3)
Chloride: 100 mmol/L (ref 98–111)
Creatinine, Ser: 0.92 mg/dL (ref 0.44–1.00)
GFR, Estimated: >60 mL/min (ref >60)
Glucose, Bld: 247 mg/dL — ABNORMAL HIGH (ref 70–99)
Potassium: 4.7 mmol/L (ref 3.5–5.1)
Sodium: 136 mmol/L (ref 135–145)
Total Bilirubin: 0.4 mg/dL (ref 0.3–1.2)
Total Protein: 6.9 g/dL (ref 6.5–8.1)

## 2021-05-27 LAB — CBC WITH DIFFERENTIAL/PLATELET
Abs Immature Granulocytes: 0.05 10*3/uL (ref 0.00–0.07)
Basophils Absolute: 0 10*3/uL (ref 0.0–0.1)
Basophils Relative: 0 %
Eosinophils Absolute: 0.1 10*3/uL (ref 0.0–0.5)
Eosinophils Relative: 1 %
HCT: 37.8 % (ref 36.0–46.0)
Hemoglobin: 12.3 g/dL (ref 12.0–15.0)
Immature Granulocytes: 1 %
Lymphocytes Relative: 13 %
Lymphs Abs: 1.4 10*3/uL (ref 0.7–4.0)
MCH: 29.1 pg (ref 26.0–34.0)
MCHC: 32.5 g/dL (ref 30.0–36.0)
MCV: 89.4 fL (ref 80.0–100.0)
Monocytes Absolute: 0.6 10*3/uL (ref 0.1–1.0)
Monocytes Relative: 6 %
Neutro Abs: 8.9 10*3/uL — ABNORMAL HIGH (ref 1.7–7.7)
Neutrophils Relative %: 79 %
Platelets: 286 10*3/uL (ref 150–400)
RBC: 4.23 MIL/uL (ref 3.87–5.11)
RDW: 14.2 % (ref 11.5–15.5)
WBC: 11.1 10*3/uL — ABNORMAL HIGH (ref 4.0–10.5)
nRBC: 0 % (ref 0.0–0.2)

## 2021-05-27 LAB — URINALYSIS, ROUTINE W REFLEX MICROSCOPIC
Bilirubin Urine: NEGATIVE
Glucose, UA: NEGATIVE mg/dL
Hgb urine dipstick: NEGATIVE
Ketones, ur: NEGATIVE mg/dL
Nitrite: POSITIVE — AB
Protein, ur: NEGATIVE mg/dL
Specific Gravity, Urine: 1.013 (ref 1.005–1.030)
pH: 5 (ref 5.0–8.0)

## 2021-05-27 LAB — TSH: TSH: 2.239 u[IU]/mL (ref 0.350–4.500)

## 2021-05-27 LAB — AMMONIA: Ammonia: 15 umol/L (ref 9–35)

## 2021-05-27 LAB — CBG MONITORING, ED: Glucose-Capillary: 242 mg/dL — ABNORMAL HIGH (ref 70–99)

## 2021-05-27 LAB — LACTIC ACID, PLASMA: Lactic Acid, Venous: 3 mmol/L (ref 0.5–1.9)

## 2021-05-27 MED ORDER — SODIUM CHLORIDE 0.9 % IV SOLN
1.0000 g | Freq: Once | INTRAVENOUS | Status: AC
  Administered 2021-05-28: 1 g via INTRAVENOUS
  Filled 2021-05-27: qty 10

## 2021-05-27 MED ORDER — NITROFURANTOIN MONOHYD MACRO 100 MG PO CAPS: 100.0000 mg | ORAL_CAPSULE | Freq: Two times a day (BID) | ORAL | 0 refills | Status: DC

## 2021-05-27 MED ORDER — CEFTRIAXONE SODIUM 1 G IJ SOLR
1.0000 g | Freq: Once | INTRAMUSCULAR | Status: DC
Start: 2021-05-27 — End: 2021-05-27

## 2021-05-27 MED ORDER — SODIUM CHLORIDE 0.9 % IV BOLUS
1000.0000 mL | Freq: Once | INTRAVENOUS | Status: AC
  Administered 2021-05-28: 1000 mL via INTRAVENOUS

## 2021-05-27 NOTE — Discharge Instructions (Addendum)
You were seen today for possible altered mental status.  CT scan was negative for acute abnormality in the brain.  Urinalysis showed evidence of a urinary tract infection.  I have prescribed antibiotics to help fight the urinary tract infection.  Recommend follow-up with your facility doctor or primary care doctor.  Please complete the entire prescription of antibiotics. Return to the ER if she develops any worsening confusion, fevers, nausea or vomiting that does not stop, or any other new severe symptoms.  ?

## 2021-05-27 NOTE — ED Triage Notes (Signed)
While transfering from wheelchair to toilet pt had episode of bl arm weakness and shaking, garbled speech and AMS. Resolved within 5 minutes. Ems does not know LSW. NO answer at snf.pt with history of TIA. Episode happened at [facility] upon arrival pt has "pain" to left side. Can not describe pain. Denies numbness and tingling ? ?

## 2021-05-27 NOTE — ED Notes (Signed)
Critical lactic 3.1. md aware ? ?

## 2021-05-27 NOTE — ED Notes (Signed)
Lsw  1740 by nurse at snf. Pt baseline orientation Is 1-2. ?

## 2021-05-27 NOTE — ED Notes (Signed)
Patient transported to CT 

## 2021-05-27 NOTE — ED Provider Notes (Signed)
?  Physical Exam  ?BP (!) 122/56   Pulse 82   Temp 98.4 ?F (36.9 ?C) (Oral)   Resp 16   Ht '5\' 6"'$  (1.676 m)   Wt 63.6 kg   SpO2 99%   BMI 22.63 kg/m?  ? ?Physical Exam ? ?Procedures  ?Procedures ? ?ED Course / MDM  ? ?Clinical Course as of 05/27/21 2323  ?Mon May 28, 3534  ?764 86 year old female here after period of shakiness weakness during a transfer.  Getting labs and imaging.  Possible admission [MB]  ?2237 Glucose, UA: NEGATIVE [LM]  ?  ?Clinical Course User Index ?[LM] Dorothyann Peng, PA-C ?[MB] Hayden Rasmussen, MD  ? ?Medical Decision Making ?Amount and/or Complexity of Data Reviewed ?Labs: ordered. Decision-making details documented in ED Course. ?Radiology: ordered. ? ?Risk ?Prescription drug management. ? ? ? ? ?Care of this patient assumed from preceding provider Cherlynn June, PA-C at time of shift change.  All pertinent HPI, physical exam, laboratory findings were discussed with him prior to his departure.  Please see his associated note for further insight into the patient's ED course. ? ?In brief patient  presents from facility with concernFor weakness and altered mental status, found to have urinary tract infection.  At time of change patient is awaiting fluid bolus and Rocephin and may be discharged back to her facility with [facility]. ? ?Patient is back to her mental status baseline.  The lactic was elevated initially, will plan on rechecking after fluid bolus.  ? ?Patient reevaluated after fluid bolus; remains at mental status baseline, confirmed by son, Hilliard Clark, at the bedside. Repeat lactic acid is normal.  ? ?No further workup warranted in the ED at this time. Will d/c with outpatient antibiotics for UTI per discussion with preceding ED provider.  ? ?Patient to be transported by [facility] back to her facility.  ? ?Belvie's son voiced understanding of her medical evaluation and treatment plan. Each of their questions answered to their expressed satisfaction.  Return precautions were given.   Patient is well-appearing, stable, and was discharged in good condition. ? ?This chart was dictated using voice recognition software, Dragon. Despite the best efforts of this provider to proofread and correct errors, errors may still occur which can change documentation meaning. ? ? ? ? ? ? ?  ?Emeline Darling, PA-C ?05/28/21 0319 ? ?  ?Delora Fuel, MD ?31/49/70 0559 ? ?

## 2021-05-27 NOTE — ED Provider Notes (Signed)
?[facility] ?Provider Note ? ? ?CSN: [id number] ?Arrival date & time: 05/27/21  1924 ? ?  ? ?History ? ?Chief Complaint  ?Patient presents with  ? Weakness  ? Altered Mental Status  ?  While transfering from wheelchair to toilet pt had episode of bl arm weakness and shaking, garbled speech and AMS. Resolved within 5 minutes.  ? ? ?Jaclyn [name] is a 86 y.o. female.  Patient presents to the emergency department via EMS from a skilled nursing facility due to possible altered mental status.  Patient was being transferred from wheelchair to the toilet and had an episode of bilateral arm weakness with shakiness, garbled speech, and altered mental status which resolved within 5 minutes.  Patient is currently alert to person place and event.  Patient denies chest pain, shortness of breath, nausea, abdominal pain, weakness.  Patient has extensive significant medical history including atrial fibrillation, type 2 diabetes, fibromyalgia, unspecified chest pain, hypothyroidism, history of UTI, dysthymic disorder, carotid artery occlusion, and history of TIA.  Patient was discharged in the hospital on February 25, 2021 with a diagnosis of worsening functional status/failure to thrive. ? ?HPI ? ?  ? ?Home Medications ?Prior to Admission medications   ?Medication Sig Start Date End Date Taking? Authorizing Provider  ?apixaban (ELIQUIS) 5 MG TABS tablet TAKE 1 TABLET BY MOUTH TWICE A DAY ?Patient taking differently: Take 5 mg by mouth 2 (two) times daily. 12/11/20   Bedsole, Amy E, MD  ?B-D ULTRAFINE III SHORT PEN 31G X 8 MM MISC USE TO INJECT INSULIN ONCE DAILY. DX: E11.65 07/02/20   Jinny Sanders, MD  ?Cholecalciferol (VITAMIN D3) 1.25 MG (50000 UT) CAPS Take 1 capsule by mouth once a week. ?Patient taking differently: Take 50,000 Units by mouth once a week. 12/27/20   Bedsole, Amy E, MD  ?DULoxetine (CYMBALTA) 30 MG capsule TAKE 2 CAPSULES BY MOUTH EVERY DAY ?Patient taking differently: Take  60 mg by mouth daily. 12/27/19   Bedsole, Amy E, MD  ?HUMALOG 100 UNIT/ML injection Inject into the skin as directed. 03/29/21   [provider]  ?insulin aspart (NOVOLOG) 100 UNIT/ML FlexPen Before each meal 3 times a day, 140-199 - 2 units, 200-250 - 6 units, 251-299 - 8 units,  300-349 - 10 units,  350 or above 12 units. 02/25/21   Thurnell Lose, MD  ?insulin glargine (LANTUS SOLOSTAR) 100 UNIT/ML Solostar Pen Inject 42 Units into the skin 2 (two) times daily. 02/25/21   Thurnell Lose, MD  ?JANUVIA 100 MG tablet TAKE 1 TABLET BY MOUTH EVERY DAY ?Patient taking differently: Take 100 mg by mouth daily. 01/07/21   Jinny Sanders, MD  ?levothyroxine (SYNTHROID) 175 MCG tablet Take 1 tablet (175 mcg total) by mouth daily. 02/25/21   Thurnell Lose, MD  ?trimethoprim (TRIMPEX) 100 MG tablet TAKE 1 TABLET BY MOUTH EVERY DAY ?Patient taking differently: Take 100 mg by mouth daily. 12/12/20   Jinny Sanders, MD  ?   ? ?Allergies    ?Naproxen sodium, Statins, Sulfonamide derivatives, Latex, and Shellfish allergy   ? ?Review of Systems   ?Review of Systems  ?Reason unable to perform ROS: Patient's baseline dementia makes ROS unreliable.  ?Respiratory:  Negative for shortness of breath.   ?Cardiovascular:  Negative for chest pain.  ?Gastrointestinal:  Negative for abdominal pain.  ?Musculoskeletal:  Negative for back pain and neck pain.  ? ?Physical Exam ?Updated Vital Signs ?BP (!) 122/56   Pulse 82  Temp 98.4 ?F (36.9 ?C) (Oral)   Resp 16   Ht '5\' 6"'$  (1.676 m)   Wt 63.6 kg   SpO2 99%   BMI 22.63 kg/m?  ?Physical Exam ?Constitutional:   ?   General: She is not in acute distress. ?HENT:  ?   Head: Normocephalic and atraumatic.  ?   Mouth/Throat:  ?   Mouth: Mucous membranes are moist.  ?Eyes:  ?   Pupils: Pupils are equal, round, and reactive to light.  ?Cardiovascular:  ?   Rate and Rhythm: Normal rate and regular rhythm.  ?   Pulses: Normal pulses.  ?   Heart sounds: Normal heart sounds.  ?Pulmonary:  ?    Effort: Pulmonary effort is normal. No respiratory distress.  ?   Breath sounds: Normal breath sounds.  ?Abdominal:  ?   Palpations: Abdomen is soft.  ?Musculoskeletal:     ?   General: No tenderness (No tenderness LUE) or signs of injury. Normal range of motion.  ?   Cervical back: Normal range of motion.  ?   Right lower leg: No edema.  ?   Left lower leg: No edema.  ?Skin: ?   General: Skin is warm and dry.  ?Neurological:  ?   Mental Status: She is alert.  ?   Comments: Alert and oriented to person, place, event  ? ? ?ED Results / Procedures / Treatments   ?Labs ?(all labs ordered are listed, but only abnormal results are displayed) ?Labs Reviewed  ?CBC WITH DIFFERENTIAL/PLATELET - Abnormal; Notable for the following components:  ?    Result Value  ? WBC 11.1 (*)   ? Neutro Abs 8.9 (*)   ? All other components within normal limits  ?COMPREHENSIVE METABOLIC PANEL - Abnormal; Notable for the following components:  ? Glucose, Bld 247 (*)   ? Albumin 3.2 (*)   ? All other components within normal limits  ?URINALYSIS, ROUTINE W REFLEX MICROSCOPIC - Abnormal; Notable for the following components:  ? APPearance HAZY (*)   ? Nitrite POSITIVE (*)   ? Leukocytes,Ua MODERATE (*)   ? Bacteria, UA MANY (*)   ? All other components within normal limits  ?LACTIC ACID, PLASMA - Abnormal; Notable for the following components:  ? Lactic Acid, Venous 3.0 (*)   ? All other components within normal limits  ?CBG MONITORING, ED - Abnormal; Notable for the following components:  ? Glucose-Capillary 242 (*)   ? All other components within normal limits  ?AMMONIA  ?TSH  ?CBG MONITORING, ED  ? ? ?EKG ?EKG Interpretation ? ?Date/Time:  Monday May 27 2021 19:32:52 EDT ?Ventricular Rate:  83 ?PR Interval:  153 ?QRS Duration: 78 ?QT Interval:  386 ?QTC Calculation: 132 ?R Axis:   -88 ?Text Interpretation: Sinus rhythm Inferior infarct, old Consider anterior infarct No significant change since prior 7/21 Confirmed by Aletta Edouard [phone])  on 05/27/2021 7:53:22 PM ? ?Radiology ?DG Chest 2 View ? ?Result Date: 05/27/2021 ?CLINICAL DATA:  Altered mental status. EXAM: CHEST - 2 VIEW COMPARISON:  01/24/2019. FINDINGS: The heart size and mediastinal contours are within normal limits. Atherosclerotic calcification of the aorta is noted. No consolidation, effusion, or pneumothorax. No acute osseous abnormality. IMPRESSION: No active cardiopulmonary disease. Electronically Signed   By: Brett Fairy M.D.   On: 05/27/2021 20:57  ? ?CT Head Wo Contrast ? ?Result Date: 05/27/2021 ?CLINICAL DATA:  Mental status change, unknown cause EXAM: CT HEAD WITHOUT CONTRAST TECHNIQUE: Contiguous axial images were obtained from  the base of the skull through the vertex without intravenous contrast. RADIATION DOSE REDUCTION: This exam was performed according to the departmental dose-optimization program which includes automated exposure control, adjustment of the mA and/or kV according to patient size and/or use of iterative reconstruction technique. COMPARISON:  Head CT 02/19/2018 FINDINGS: Brain: Stable degree of atrophy and chronic small vessel ischemia. No intracranial hemorrhage, mass effect, or midline shift. No hydrocephalus. The basilar cisterns are patent. Basal gangliar mineralization, senescent. No evidence of territorial infarct or acute ischemia. No extra-axial or intracranial fluid collection. Vascular: Atherosclerosis of skullbase vasculature without hyperdense vessel or abnormal calcification. Skull: No fracture or focal lesion. Sinuses/Orbits: Mucosal thickening of right and left side of sphenoid sinus with scattered opacification of ethmoid air cells. No mastoid effusion. Left cataract resection. Other: None. IMPRESSION: 1. No acute intracranial abnormality. 2. Stable atrophy and chronic small vessel ischemia. Electronically Signed   By: Keith Rake M.D.   On: 05/27/2021 21:33   ? ?Procedures ?Procedures  ? ? ?Medications Ordered in ED ?Medications  ?sodium  chloride 0.9 % bolus 1,000 mL (has no administration in time range)  ?cefTRIAXone (ROCEPHIN) 1 g in sodium chloride 0.9 % 100 mL IVPB (has no administration in time range)  ? ? ?ED Course/ [facility] [place]

## 2021-05-28 DIAGNOSIS — M797 Fibromyalgia: Secondary | ICD-10-CM | POA: Diagnosis not present

## 2021-05-28 DIAGNOSIS — R41841 Cognitive communication deficit: Secondary | ICD-10-CM | POA: Diagnosis not present

## 2021-05-28 DIAGNOSIS — R2689 Other abnormalities of gait and mobility: Secondary | ICD-10-CM | POA: Diagnosis not present

## 2021-05-28 DIAGNOSIS — R251 Tremor, unspecified: Secondary | ICD-10-CM | POA: Diagnosis not present

## 2021-05-28 DIAGNOSIS — R279 Unspecified lack of coordination: Secondary | ICD-10-CM | POA: Diagnosis not present

## 2021-05-28 DIAGNOSIS — M6281 Muscle weakness (generalized): Secondary | ICD-10-CM | POA: Diagnosis not present

## 2021-05-28 DIAGNOSIS — Z7401 Bed confinement status: Secondary | ICD-10-CM | POA: Diagnosis not present

## 2021-05-28 DIAGNOSIS — F0390 Unspecified dementia without behavioral disturbance: Secondary | ICD-10-CM | POA: Diagnosis not present

## 2021-05-28 DIAGNOSIS — R4182 Altered mental status, unspecified: Secondary | ICD-10-CM | POA: Diagnosis not present

## 2021-05-28 DIAGNOSIS — S728X1D Other fracture of right femur, subsequent encounter for closed fracture with routine healing: Secondary | ICD-10-CM | POA: Diagnosis not present

## 2021-05-28 DIAGNOSIS — I480 Paroxysmal atrial fibrillation: Secondary | ICD-10-CM | POA: Diagnosis not present

## 2021-05-28 LAB — LACTIC ACID, PLASMA: Lactic Acid, Venous: 1.9 mmol/L (ref 0.5–1.9)

## 2021-05-28 NOTE — ED Notes (Signed)
Called [facility] to transport patient to [facility] 

## 2021-05-28 NOTE — ED Notes (Signed)
Called snf to give report. Stayed on hold for ten minutes twice. Gave report to ems ?

## 2021-05-29 DIAGNOSIS — R279 Unspecified lack of coordination: Secondary | ICD-10-CM | POA: Diagnosis not present

## 2021-05-29 DIAGNOSIS — R251 Tremor, unspecified: Secondary | ICD-10-CM | POA: Diagnosis not present

## 2021-05-29 DIAGNOSIS — S728X1D Other fracture of right femur, subsequent encounter for closed fracture with routine healing: Secondary | ICD-10-CM | POA: Diagnosis not present

## 2021-05-29 DIAGNOSIS — I480 Paroxysmal atrial fibrillation: Secondary | ICD-10-CM | POA: Diagnosis not present

## 2021-05-29 DIAGNOSIS — M797 Fibromyalgia: Secondary | ICD-10-CM | POA: Diagnosis not present

## 2021-05-29 DIAGNOSIS — R41841 Cognitive communication deficit: Secondary | ICD-10-CM | POA: Diagnosis not present

## 2021-05-29 DIAGNOSIS — R2689 Other abnormalities of gait and mobility: Secondary | ICD-10-CM | POA: Diagnosis not present

## 2021-05-29 DIAGNOSIS — F0390 Unspecified dementia without behavioral disturbance: Secondary | ICD-10-CM | POA: Diagnosis not present

## 2021-05-29 DIAGNOSIS — M6281 Muscle weakness (generalized): Secondary | ICD-10-CM | POA: Diagnosis not present

## 2021-05-30 ENCOUNTER — Telehealth: Payer: Self-pay

## 2021-05-30 DIAGNOSIS — R251 Tremor, unspecified: Secondary | ICD-10-CM | POA: Diagnosis not present

## 2021-05-30 DIAGNOSIS — M6281 Muscle weakness (generalized): Secondary | ICD-10-CM | POA: Diagnosis not present

## 2021-05-30 DIAGNOSIS — M797 Fibromyalgia: Secondary | ICD-10-CM | POA: Diagnosis not present

## 2021-05-30 DIAGNOSIS — F0390 Unspecified dementia without behavioral disturbance: Secondary | ICD-10-CM | POA: Diagnosis not present

## 2021-05-30 DIAGNOSIS — R2689 Other abnormalities of gait and mobility: Secondary | ICD-10-CM | POA: Diagnosis not present

## 2021-05-30 DIAGNOSIS — R41841 Cognitive communication deficit: Secondary | ICD-10-CM | POA: Diagnosis not present

## 2021-05-30 DIAGNOSIS — R279 Unspecified lack of coordination: Secondary | ICD-10-CM | POA: Diagnosis not present

## 2021-05-30 DIAGNOSIS — S728X1D Other fracture of right femur, subsequent encounter for closed fracture with routine healing: Secondary | ICD-10-CM | POA: Diagnosis not present

## 2021-05-30 DIAGNOSIS — I480 Paroxysmal atrial fibrillation: Secondary | ICD-10-CM | POA: Diagnosis not present

## 2021-05-30 DIAGNOSIS — Z23 Encounter for immunization: Secondary | ICD-10-CM | POA: Diagnosis not present

## 2021-05-30 NOTE — Telephone Encounter (Signed)
New message    1. Which medications need to be refilled? (please list name of each medication and dose if known) Trulicity .75 mg   2. Which pharmacy/location (including street and city if local pharmacy) is medication to be sent to?[place] Patient assistance program    3. Do they need a 30 day or 90 day supply? 30days

## 2021-05-31 DIAGNOSIS — R41841 Cognitive communication deficit: Secondary | ICD-10-CM | POA: Diagnosis not present

## 2021-05-31 DIAGNOSIS — S728X1D Other fracture of right femur, subsequent encounter for closed fracture with routine healing: Secondary | ICD-10-CM | POA: Diagnosis not present

## 2021-05-31 DIAGNOSIS — M797 Fibromyalgia: Secondary | ICD-10-CM | POA: Diagnosis not present

## 2021-05-31 DIAGNOSIS — R2689 Other abnormalities of gait and mobility: Secondary | ICD-10-CM | POA: Diagnosis not present

## 2021-05-31 DIAGNOSIS — M6281 Muscle weakness (generalized): Secondary | ICD-10-CM | POA: Diagnosis not present

## 2021-05-31 DIAGNOSIS — R251 Tremor, unspecified: Secondary | ICD-10-CM | POA: Diagnosis not present

## 2021-05-31 DIAGNOSIS — R279 Unspecified lack of coordination: Secondary | ICD-10-CM | POA: Diagnosis not present

## 2021-05-31 DIAGNOSIS — F0390 Unspecified dementia without behavioral disturbance: Secondary | ICD-10-CM | POA: Diagnosis not present

## 2021-05-31 DIAGNOSIS — I480 Paroxysmal atrial fibrillation: Secondary | ICD-10-CM | POA: Diagnosis not present

## 2021-06-01 DIAGNOSIS — M6281 Muscle weakness (generalized): Secondary | ICD-10-CM | POA: Diagnosis not present

## 2021-06-01 DIAGNOSIS — R279 Unspecified lack of coordination: Secondary | ICD-10-CM | POA: Diagnosis not present

## 2021-06-01 DIAGNOSIS — R2689 Other abnormalities of gait and mobility: Secondary | ICD-10-CM | POA: Diagnosis not present

## 2021-06-01 DIAGNOSIS — R251 Tremor, unspecified: Secondary | ICD-10-CM | POA: Diagnosis not present

## 2021-06-01 DIAGNOSIS — M797 Fibromyalgia: Secondary | ICD-10-CM | POA: Diagnosis not present

## 2021-06-01 DIAGNOSIS — S728X1D Other fracture of right femur, subsequent encounter for closed fracture with routine healing: Secondary | ICD-10-CM | POA: Diagnosis not present

## 2021-06-01 DIAGNOSIS — F0390 Unspecified dementia without behavioral disturbance: Secondary | ICD-10-CM | POA: Diagnosis not present

## 2021-06-01 DIAGNOSIS — R41841 Cognitive communication deficit: Secondary | ICD-10-CM | POA: Diagnosis not present

## 2021-06-01 DIAGNOSIS — I480 Paroxysmal atrial fibrillation: Secondary | ICD-10-CM | POA: Diagnosis not present

## 2021-06-02 DIAGNOSIS — R2689 Other abnormalities of gait and mobility: Secondary | ICD-10-CM | POA: Diagnosis not present

## 2021-06-02 DIAGNOSIS — F0390 Unspecified dementia without behavioral disturbance: Secondary | ICD-10-CM | POA: Diagnosis not present

## 2021-06-02 DIAGNOSIS — M6281 Muscle weakness (generalized): Secondary | ICD-10-CM | POA: Diagnosis not present

## 2021-06-02 DIAGNOSIS — S728X1D Other fracture of right femur, subsequent encounter for closed fracture with routine healing: Secondary | ICD-10-CM | POA: Diagnosis not present

## 2021-06-02 DIAGNOSIS — R279 Unspecified lack of coordination: Secondary | ICD-10-CM | POA: Diagnosis not present

## 2021-06-02 DIAGNOSIS — R41841 Cognitive communication deficit: Secondary | ICD-10-CM | POA: Diagnosis not present

## 2021-06-02 DIAGNOSIS — M797 Fibromyalgia: Secondary | ICD-10-CM | POA: Diagnosis not present

## 2021-06-02 DIAGNOSIS — I480 Paroxysmal atrial fibrillation: Secondary | ICD-10-CM | POA: Diagnosis not present

## 2021-06-02 DIAGNOSIS — R251 Tremor, unspecified: Secondary | ICD-10-CM | POA: Diagnosis not present

## 2021-06-03 DIAGNOSIS — R2689 Other abnormalities of gait and mobility: Secondary | ICD-10-CM | POA: Diagnosis not present

## 2021-06-03 DIAGNOSIS — M6281 Muscle weakness (generalized): Secondary | ICD-10-CM | POA: Diagnosis not present

## 2021-06-03 DIAGNOSIS — R41841 Cognitive communication deficit: Secondary | ICD-10-CM | POA: Diagnosis not present

## 2021-06-03 DIAGNOSIS — R251 Tremor, unspecified: Secondary | ICD-10-CM | POA: Diagnosis not present

## 2021-06-03 DIAGNOSIS — I480 Paroxysmal atrial fibrillation: Secondary | ICD-10-CM | POA: Diagnosis not present

## 2021-06-03 DIAGNOSIS — F0390 Unspecified dementia without behavioral disturbance: Secondary | ICD-10-CM | POA: Diagnosis not present

## 2021-06-03 DIAGNOSIS — S728X1D Other fracture of right femur, subsequent encounter for closed fracture with routine healing: Secondary | ICD-10-CM | POA: Diagnosis not present

## 2021-06-03 DIAGNOSIS — M797 Fibromyalgia: Secondary | ICD-10-CM | POA: Diagnosis not present

## 2021-06-03 DIAGNOSIS — R279 Unspecified lack of coordination: Secondary | ICD-10-CM | POA: Diagnosis not present

## 2021-06-03 NOTE — Telephone Encounter (Signed)
Per patients son, patient has not had the medication Trulicity all year, she is in SNF and they are managing medications and he has no concerns at this time. ? ?Jaclyn [name], [name] notified ? ?Jaclyn [name], CCMA ?Health concierge  ?[phone]  ?

## 2021-06-04 DIAGNOSIS — M6281 Muscle weakness (generalized): Secondary | ICD-10-CM | POA: Diagnosis not present

## 2021-06-04 DIAGNOSIS — R41841 Cognitive communication deficit: Secondary | ICD-10-CM | POA: Diagnosis not present

## 2021-06-04 DIAGNOSIS — F0390 Unspecified dementia without behavioral disturbance: Secondary | ICD-10-CM | POA: Diagnosis not present

## 2021-06-04 DIAGNOSIS — R251 Tremor, unspecified: Secondary | ICD-10-CM | POA: Diagnosis not present

## 2021-06-04 DIAGNOSIS — R279 Unspecified lack of coordination: Secondary | ICD-10-CM | POA: Diagnosis not present

## 2021-06-04 DIAGNOSIS — S728X1D Other fracture of right femur, subsequent encounter for closed fracture with routine healing: Secondary | ICD-10-CM | POA: Diagnosis not present

## 2021-06-04 DIAGNOSIS — R2689 Other abnormalities of gait and mobility: Secondary | ICD-10-CM | POA: Diagnosis not present

## 2021-06-04 DIAGNOSIS — I480 Paroxysmal atrial fibrillation: Secondary | ICD-10-CM | POA: Diagnosis not present

## 2021-06-04 DIAGNOSIS — M797 Fibromyalgia: Secondary | ICD-10-CM | POA: Diagnosis not present

## 2021-06-04 NOTE — Telephone Encounter (Signed)
No further action needed. Will not refill Trulicity. ?

## 2021-06-05 DIAGNOSIS — F0390 Unspecified dementia without behavioral disturbance: Secondary | ICD-10-CM | POA: Diagnosis not present

## 2021-06-05 DIAGNOSIS — R251 Tremor, unspecified: Secondary | ICD-10-CM | POA: Diagnosis not present

## 2021-06-05 DIAGNOSIS — M797 Fibromyalgia: Secondary | ICD-10-CM | POA: Diagnosis not present

## 2021-06-05 DIAGNOSIS — I480 Paroxysmal atrial fibrillation: Secondary | ICD-10-CM | POA: Diagnosis not present

## 2021-06-05 DIAGNOSIS — S728X1D Other fracture of right femur, subsequent encounter for closed fracture with routine healing: Secondary | ICD-10-CM | POA: Diagnosis not present

## 2021-06-05 DIAGNOSIS — R279 Unspecified lack of coordination: Secondary | ICD-10-CM | POA: Diagnosis not present

## 2021-06-05 DIAGNOSIS — M6281 Muscle weakness (generalized): Secondary | ICD-10-CM | POA: Diagnosis not present

## 2021-06-05 DIAGNOSIS — R41841 Cognitive communication deficit: Secondary | ICD-10-CM | POA: Diagnosis not present

## 2021-06-05 DIAGNOSIS — R2689 Other abnormalities of gait and mobility: Secondary | ICD-10-CM | POA: Diagnosis not present

## 2021-06-06 DIAGNOSIS — S728X1D Other fracture of right femur, subsequent encounter for closed fracture with routine healing: Secondary | ICD-10-CM | POA: Diagnosis not present

## 2021-06-06 DIAGNOSIS — M797 Fibromyalgia: Secondary | ICD-10-CM | POA: Diagnosis not present

## 2021-06-06 DIAGNOSIS — I480 Paroxysmal atrial fibrillation: Secondary | ICD-10-CM | POA: Diagnosis not present

## 2021-06-06 DIAGNOSIS — M6281 Muscle weakness (generalized): Secondary | ICD-10-CM | POA: Diagnosis not present

## 2021-06-06 DIAGNOSIS — R41841 Cognitive communication deficit: Secondary | ICD-10-CM | POA: Diagnosis not present

## 2021-06-06 DIAGNOSIS — R251 Tremor, unspecified: Secondary | ICD-10-CM | POA: Diagnosis not present

## 2021-06-06 DIAGNOSIS — R279 Unspecified lack of coordination: Secondary | ICD-10-CM | POA: Diagnosis not present

## 2021-06-06 DIAGNOSIS — F0390 Unspecified dementia without behavioral disturbance: Secondary | ICD-10-CM | POA: Diagnosis not present

## 2021-06-06 DIAGNOSIS — R2689 Other abnormalities of gait and mobility: Secondary | ICD-10-CM | POA: Diagnosis not present

## 2021-06-07 DIAGNOSIS — R2689 Other abnormalities of gait and mobility: Secondary | ICD-10-CM | POA: Diagnosis not present

## 2021-06-07 DIAGNOSIS — S728X1D Other fracture of right femur, subsequent encounter for closed fracture with routine healing: Secondary | ICD-10-CM | POA: Diagnosis not present

## 2021-06-07 DIAGNOSIS — R41841 Cognitive communication deficit: Secondary | ICD-10-CM | POA: Diagnosis not present

## 2021-06-07 DIAGNOSIS — R279 Unspecified lack of coordination: Secondary | ICD-10-CM | POA: Diagnosis not present

## 2021-06-07 DIAGNOSIS — F0390 Unspecified dementia without behavioral disturbance: Secondary | ICD-10-CM | POA: Diagnosis not present

## 2021-06-07 DIAGNOSIS — I480 Paroxysmal atrial fibrillation: Secondary | ICD-10-CM | POA: Diagnosis not present

## 2021-06-07 DIAGNOSIS — M6281 Muscle weakness (generalized): Secondary | ICD-10-CM | POA: Diagnosis not present

## 2021-06-07 DIAGNOSIS — R251 Tremor, unspecified: Secondary | ICD-10-CM | POA: Diagnosis not present

## 2021-06-07 DIAGNOSIS — M797 Fibromyalgia: Secondary | ICD-10-CM | POA: Diagnosis not present

## 2021-06-08 DIAGNOSIS — M797 Fibromyalgia: Secondary | ICD-10-CM | POA: Diagnosis not present

## 2021-06-08 DIAGNOSIS — R279 Unspecified lack of coordination: Secondary | ICD-10-CM | POA: Diagnosis not present

## 2021-06-08 DIAGNOSIS — R41841 Cognitive communication deficit: Secondary | ICD-10-CM | POA: Diagnosis not present

## 2021-06-08 DIAGNOSIS — S728X1D Other fracture of right femur, subsequent encounter for closed fracture with routine healing: Secondary | ICD-10-CM | POA: Diagnosis not present

## 2021-06-08 DIAGNOSIS — R251 Tremor, unspecified: Secondary | ICD-10-CM | POA: Diagnosis not present

## 2021-06-08 DIAGNOSIS — F0390 Unspecified dementia without behavioral disturbance: Secondary | ICD-10-CM | POA: Diagnosis not present

## 2021-06-08 DIAGNOSIS — I480 Paroxysmal atrial fibrillation: Secondary | ICD-10-CM | POA: Diagnosis not present

## 2021-06-08 DIAGNOSIS — M6281 Muscle weakness (generalized): Secondary | ICD-10-CM | POA: Diagnosis not present

## 2021-06-08 DIAGNOSIS — R2689 Other abnormalities of gait and mobility: Secondary | ICD-10-CM | POA: Diagnosis not present

## 2021-06-09 DIAGNOSIS — F0390 Unspecified dementia without behavioral disturbance: Secondary | ICD-10-CM | POA: Diagnosis not present

## 2021-06-09 DIAGNOSIS — R2689 Other abnormalities of gait and mobility: Secondary | ICD-10-CM | POA: Diagnosis not present

## 2021-06-09 DIAGNOSIS — S728X1D Other fracture of right femur, subsequent encounter for closed fracture with routine healing: Secondary | ICD-10-CM | POA: Diagnosis not present

## 2021-06-09 DIAGNOSIS — I480 Paroxysmal atrial fibrillation: Secondary | ICD-10-CM | POA: Diagnosis not present

## 2021-06-09 DIAGNOSIS — M6281 Muscle weakness (generalized): Secondary | ICD-10-CM | POA: Diagnosis not present

## 2021-06-09 DIAGNOSIS — R279 Unspecified lack of coordination: Secondary | ICD-10-CM | POA: Diagnosis not present

## 2021-06-09 DIAGNOSIS — R41841 Cognitive communication deficit: Secondary | ICD-10-CM | POA: Diagnosis not present

## 2021-06-09 DIAGNOSIS — R251 Tremor, unspecified: Secondary | ICD-10-CM | POA: Diagnosis not present

## 2021-06-09 DIAGNOSIS — M797 Fibromyalgia: Secondary | ICD-10-CM | POA: Diagnosis not present

## 2021-06-10 DIAGNOSIS — I480 Paroxysmal atrial fibrillation: Secondary | ICD-10-CM | POA: Diagnosis not present

## 2021-06-10 DIAGNOSIS — R251 Tremor, unspecified: Secondary | ICD-10-CM | POA: Diagnosis not present

## 2021-06-10 DIAGNOSIS — R279 Unspecified lack of coordination: Secondary | ICD-10-CM | POA: Diagnosis not present

## 2021-06-10 DIAGNOSIS — R2689 Other abnormalities of gait and mobility: Secondary | ICD-10-CM | POA: Diagnosis not present

## 2021-06-10 DIAGNOSIS — F0390 Unspecified dementia without behavioral disturbance: Secondary | ICD-10-CM | POA: Diagnosis not present

## 2021-06-10 DIAGNOSIS — R41841 Cognitive communication deficit: Secondary | ICD-10-CM | POA: Diagnosis not present

## 2021-06-10 DIAGNOSIS — M6281 Muscle weakness (generalized): Secondary | ICD-10-CM | POA: Diagnosis not present

## 2021-06-10 DIAGNOSIS — M797 Fibromyalgia: Secondary | ICD-10-CM | POA: Diagnosis not present

## 2021-06-10 DIAGNOSIS — S728X1D Other fracture of right femur, subsequent encounter for closed fracture with routine healing: Secondary | ICD-10-CM | POA: Diagnosis not present

## 2021-06-11 DIAGNOSIS — M6281 Muscle weakness (generalized): Secondary | ICD-10-CM | POA: Diagnosis not present

## 2021-06-11 DIAGNOSIS — F0390 Unspecified dementia without behavioral disturbance: Secondary | ICD-10-CM | POA: Diagnosis not present

## 2021-06-11 DIAGNOSIS — S728X1D Other fracture of right femur, subsequent encounter for closed fracture with routine healing: Secondary | ICD-10-CM | POA: Diagnosis not present

## 2021-06-11 DIAGNOSIS — R251 Tremor, unspecified: Secondary | ICD-10-CM | POA: Diagnosis not present

## 2021-06-11 DIAGNOSIS — R2689 Other abnormalities of gait and mobility: Secondary | ICD-10-CM | POA: Diagnosis not present

## 2021-06-11 DIAGNOSIS — M797 Fibromyalgia: Secondary | ICD-10-CM | POA: Diagnosis not present

## 2021-06-11 DIAGNOSIS — R41841 Cognitive communication deficit: Secondary | ICD-10-CM | POA: Diagnosis not present

## 2021-06-11 DIAGNOSIS — R279 Unspecified lack of coordination: Secondary | ICD-10-CM | POA: Diagnosis not present

## 2021-06-11 DIAGNOSIS — I480 Paroxysmal atrial fibrillation: Secondary | ICD-10-CM | POA: Diagnosis not present

## 2021-06-12 DIAGNOSIS — S728X1D Other fracture of right femur, subsequent encounter for closed fracture with routine healing: Secondary | ICD-10-CM | POA: Diagnosis not present

## 2021-06-12 DIAGNOSIS — F0390 Unspecified dementia without behavioral disturbance: Secondary | ICD-10-CM | POA: Diagnosis not present

## 2021-06-12 DIAGNOSIS — M6281 Muscle weakness (generalized): Secondary | ICD-10-CM | POA: Diagnosis not present

## 2021-06-12 DIAGNOSIS — I480 Paroxysmal atrial fibrillation: Secondary | ICD-10-CM | POA: Diagnosis not present

## 2021-06-12 DIAGNOSIS — M797 Fibromyalgia: Secondary | ICD-10-CM | POA: Diagnosis not present

## 2021-06-12 DIAGNOSIS — R251 Tremor, unspecified: Secondary | ICD-10-CM | POA: Diagnosis not present

## 2021-06-12 DIAGNOSIS — R2689 Other abnormalities of gait and mobility: Secondary | ICD-10-CM | POA: Diagnosis not present

## 2021-06-12 DIAGNOSIS — R41841 Cognitive communication deficit: Secondary | ICD-10-CM | POA: Diagnosis not present

## 2021-06-12 DIAGNOSIS — R279 Unspecified lack of coordination: Secondary | ICD-10-CM | POA: Diagnosis not present

## 2021-07-01 ENCOUNTER — Encounter (HOSPITAL_COMMUNITY): Payer: Medicare Other

## 2021-07-18 DIAGNOSIS — M6281 Muscle weakness (generalized): Secondary | ICD-10-CM | POA: Diagnosis not present

## 2021-07-23 ENCOUNTER — Telehealth: Payer: Self-pay | Admitting: Family Medicine

## 2021-07-23 NOTE — Telephone Encounter (Signed)
Spoke to patient son to schedule Medicare Annual Wellness Visit (AWV) either virtually or phone  He stated pt in [facility] rehab right now call back in Sept  awvi 02/17/09 per [place]   please schedule at anytime with health coach

## 2021-08-05 ENCOUNTER — Telehealth: Payer: Self-pay

## 2021-08-05 NOTE — Chronic Care Management (AMB) (Signed)
    Chronic Care Management Pharmacy Assistant   Name: Kinberly Perris  MRN: [id number] DOB: 1935/03/05   Reason for Encounter: Reminder Call      Medications: Outpatient Encounter Medications as of 08/05/2021  Medication Sig   apixaban (ELIQUIS) 5 MG TABS tablet TAKE 1 TABLET BY MOUTH TWICE A DAY (Patient taking differently: Take 5 mg by mouth 2 (two) times daily.)   B-D ULTRAFINE III SHORT PEN 31G X 8 MM MISC USE TO INJECT INSULIN ONCE DAILY. DX: E11.65   Cholecalciferol (VITAMIN D3) 1.25 MG (50000 UT) CAPS Take 1 capsule by mouth once a week. (Patient taking differently: Take 50,000 Units by mouth once a week.)   DULoxetine (CYMBALTA) 30 MG capsule TAKE 2 CAPSULES BY MOUTH EVERY DAY (Patient taking differently: Take 60 mg by mouth daily.)   HUMALOG 100 UNIT/ML injection Inject into the skin as directed.   insulin aspart (NOVOLOG) 100 UNIT/ML FlexPen Before each meal 3 times a day, 140-199 - 2 units, 200-250 - 6 units, 251-299 - 8 units,  300-349 - 10 units,  350 or above 12 units.   insulin glargine (LANTUS SOLOSTAR) 100 UNIT/ML Solostar Pen Inject 42 Units into the skin 2 (two) times daily.   JANUVIA 100 MG tablet TAKE 1 TABLET BY MOUTH EVERY DAY (Patient taking differently: Take 100 mg by mouth daily.)   levothyroxine (SYNTHROID) 175 MCG tablet Take 1 tablet (175 mcg total) by mouth daily.   nitrofurantoin, macrocrystal-monohydrate, (MACROBID) 100 MG capsule Take 1 capsule (100 mg total) by mouth 2 (two) times daily.   trimethoprim (TRIMPEX) 100 MG tablet TAKE 1 TABLET BY MOUTH EVERY DAY (Patient taking differently: Take 100 mg by mouth daily.)   No facility-administered encounter medications on file as of 08/05/2021.   Makynlie Rossini was contacted to remind of upcoming telephone visit with Charlene Brooke on 08/08/21 at 11:00am. Patient was reminded to have any blood glucose and blood pressure readings available for review at appointment.   Patient confirmed appointment.  Spoke  with the son [name]. Appointment had to be rescheduled due to time. Late appointment needed for son to speak on behalf of his mother.    Are you having any problems with your medications? No   Do you have any concerns you like to discuss with the pharmacist? No   CCM referral has been placed prior to visit?  Yes    Star Rating Drugs: Medication:  Last Fill: Day Supply  Patient in SNF Novolog  07/05/21 30 Lantus   07/12/21 30 Januvia '100mg'$  09/21/67  14 Metformin '500mg'$  07/22/21  [facility] [place], [place]  [name] notified  Avel Sensor, [place] Assistant [phone]

## 2021-08-08 ENCOUNTER — Telehealth: Payer: Medicare Other

## 2021-08-17 DIAGNOSIS — M6281 Muscle weakness (generalized): Secondary | ICD-10-CM | POA: Diagnosis not present

## 2021-08-26 ENCOUNTER — Encounter (HOSPITAL_COMMUNITY): Payer: Medicare Other

## 2021-09-02 DIAGNOSIS — M6281 Muscle weakness (generalized): Secondary | ICD-10-CM | POA: Diagnosis not present

## 2021-09-02 DIAGNOSIS — I480 Paroxysmal atrial fibrillation: Secondary | ICD-10-CM | POA: Diagnosis not present

## 2021-09-02 DIAGNOSIS — S728X1D Other fracture of right femur, subsequent encounter for closed fracture with routine healing: Secondary | ICD-10-CM | POA: Diagnosis not present

## 2021-09-02 DIAGNOSIS — R41841 Cognitive communication deficit: Secondary | ICD-10-CM | POA: Diagnosis not present

## 2021-09-02 DIAGNOSIS — R1312 Dysphagia, oropharyngeal phase: Secondary | ICD-10-CM | POA: Diagnosis not present

## 2021-09-02 DIAGNOSIS — R1314 Dysphagia, pharyngoesophageal phase: Secondary | ICD-10-CM | POA: Diagnosis not present

## 2021-09-02 DIAGNOSIS — M797 Fibromyalgia: Secondary | ICD-10-CM | POA: Diagnosis not present

## 2021-09-03 DIAGNOSIS — S728X1D Other fracture of right femur, subsequent encounter for closed fracture with routine healing: Secondary | ICD-10-CM | POA: Diagnosis not present

## 2021-09-03 DIAGNOSIS — R1314 Dysphagia, pharyngoesophageal phase: Secondary | ICD-10-CM | POA: Diagnosis not present

## 2021-09-03 DIAGNOSIS — R41841 Cognitive communication deficit: Secondary | ICD-10-CM | POA: Diagnosis not present

## 2021-09-03 DIAGNOSIS — M797 Fibromyalgia: Secondary | ICD-10-CM | POA: Diagnosis not present

## 2021-09-03 DIAGNOSIS — I480 Paroxysmal atrial fibrillation: Secondary | ICD-10-CM | POA: Diagnosis not present

## 2021-09-03 DIAGNOSIS — M6281 Muscle weakness (generalized): Secondary | ICD-10-CM | POA: Diagnosis not present

## 2021-09-03 DIAGNOSIS — R1312 Dysphagia, oropharyngeal phase: Secondary | ICD-10-CM | POA: Diagnosis not present

## 2021-09-04 DIAGNOSIS — M6281 Muscle weakness (generalized): Secondary | ICD-10-CM | POA: Diagnosis not present

## 2021-09-04 DIAGNOSIS — R1314 Dysphagia, pharyngoesophageal phase: Secondary | ICD-10-CM | POA: Diagnosis not present

## 2021-09-04 DIAGNOSIS — R41841 Cognitive communication deficit: Secondary | ICD-10-CM | POA: Diagnosis not present

## 2021-09-04 DIAGNOSIS — I480 Paroxysmal atrial fibrillation: Secondary | ICD-10-CM | POA: Diagnosis not present

## 2021-09-04 DIAGNOSIS — S728X1D Other fracture of right femur, subsequent encounter for closed fracture with routine healing: Secondary | ICD-10-CM | POA: Diagnosis not present

## 2021-09-04 DIAGNOSIS — M797 Fibromyalgia: Secondary | ICD-10-CM | POA: Diagnosis not present

## 2021-09-04 DIAGNOSIS — R1312 Dysphagia, oropharyngeal phase: Secondary | ICD-10-CM | POA: Diagnosis not present

## 2021-09-05 DIAGNOSIS — M797 Fibromyalgia: Secondary | ICD-10-CM | POA: Diagnosis not present

## 2021-09-05 DIAGNOSIS — R1312 Dysphagia, oropharyngeal phase: Secondary | ICD-10-CM | POA: Diagnosis not present

## 2021-09-05 DIAGNOSIS — R1314 Dysphagia, pharyngoesophageal phase: Secondary | ICD-10-CM | POA: Diagnosis not present

## 2021-09-05 DIAGNOSIS — M6281 Muscle weakness (generalized): Secondary | ICD-10-CM | POA: Diagnosis not present

## 2021-09-05 DIAGNOSIS — I480 Paroxysmal atrial fibrillation: Secondary | ICD-10-CM | POA: Diagnosis not present

## 2021-09-05 DIAGNOSIS — R41841 Cognitive communication deficit: Secondary | ICD-10-CM | POA: Diagnosis not present

## 2021-09-05 DIAGNOSIS — S728X1D Other fracture of right femur, subsequent encounter for closed fracture with routine healing: Secondary | ICD-10-CM | POA: Diagnosis not present

## 2021-09-06 DIAGNOSIS — R1314 Dysphagia, pharyngoesophageal phase: Secondary | ICD-10-CM | POA: Diagnosis not present

## 2021-09-06 DIAGNOSIS — M6281 Muscle weakness (generalized): Secondary | ICD-10-CM | POA: Diagnosis not present

## 2021-09-06 DIAGNOSIS — R1312 Dysphagia, oropharyngeal phase: Secondary | ICD-10-CM | POA: Diagnosis not present

## 2021-09-06 DIAGNOSIS — I480 Paroxysmal atrial fibrillation: Secondary | ICD-10-CM | POA: Diagnosis not present

## 2021-09-06 DIAGNOSIS — S728X1D Other fracture of right femur, subsequent encounter for closed fracture with routine healing: Secondary | ICD-10-CM | POA: Diagnosis not present

## 2021-09-06 DIAGNOSIS — M797 Fibromyalgia: Secondary | ICD-10-CM | POA: Diagnosis not present

## 2021-09-06 DIAGNOSIS — R41841 Cognitive communication deficit: Secondary | ICD-10-CM | POA: Diagnosis not present

## 2021-09-09 DIAGNOSIS — M6281 Muscle weakness (generalized): Secondary | ICD-10-CM | POA: Diagnosis not present

## 2021-09-09 DIAGNOSIS — R1314 Dysphagia, pharyngoesophageal phase: Secondary | ICD-10-CM | POA: Diagnosis not present

## 2021-09-09 DIAGNOSIS — S728X1D Other fracture of right femur, subsequent encounter for closed fracture with routine healing: Secondary | ICD-10-CM | POA: Diagnosis not present

## 2021-09-09 DIAGNOSIS — I480 Paroxysmal atrial fibrillation: Secondary | ICD-10-CM | POA: Diagnosis not present

## 2021-09-09 DIAGNOSIS — R41841 Cognitive communication deficit: Secondary | ICD-10-CM | POA: Diagnosis not present

## 2021-09-09 DIAGNOSIS — R1312 Dysphagia, oropharyngeal phase: Secondary | ICD-10-CM | POA: Diagnosis not present

## 2021-09-09 DIAGNOSIS — M797 Fibromyalgia: Secondary | ICD-10-CM | POA: Diagnosis not present

## 2021-09-10 DIAGNOSIS — M6281 Muscle weakness (generalized): Secondary | ICD-10-CM | POA: Diagnosis not present

## 2021-09-10 DIAGNOSIS — R41841 Cognitive communication deficit: Secondary | ICD-10-CM | POA: Diagnosis not present

## 2021-09-10 DIAGNOSIS — R1312 Dysphagia, oropharyngeal phase: Secondary | ICD-10-CM | POA: Diagnosis not present

## 2021-09-10 DIAGNOSIS — S728X1D Other fracture of right femur, subsequent encounter for closed fracture with routine healing: Secondary | ICD-10-CM | POA: Diagnosis not present

## 2021-09-10 DIAGNOSIS — I480 Paroxysmal atrial fibrillation: Secondary | ICD-10-CM | POA: Diagnosis not present

## 2021-09-10 DIAGNOSIS — M797 Fibromyalgia: Secondary | ICD-10-CM | POA: Diagnosis not present

## 2021-09-10 DIAGNOSIS — R1314 Dysphagia, pharyngoesophageal phase: Secondary | ICD-10-CM | POA: Diagnosis not present

## 2021-09-11 DIAGNOSIS — R1314 Dysphagia, pharyngoesophageal phase: Secondary | ICD-10-CM | POA: Diagnosis not present

## 2021-09-11 DIAGNOSIS — M6281 Muscle weakness (generalized): Secondary | ICD-10-CM | POA: Diagnosis not present

## 2021-09-11 DIAGNOSIS — M797 Fibromyalgia: Secondary | ICD-10-CM | POA: Diagnosis not present

## 2021-09-11 DIAGNOSIS — R41841 Cognitive communication deficit: Secondary | ICD-10-CM | POA: Diagnosis not present

## 2021-09-11 DIAGNOSIS — R1312 Dysphagia, oropharyngeal phase: Secondary | ICD-10-CM | POA: Diagnosis not present

## 2021-09-11 DIAGNOSIS — S728X1D Other fracture of right femur, subsequent encounter for closed fracture with routine healing: Secondary | ICD-10-CM | POA: Diagnosis not present

## 2021-09-11 DIAGNOSIS — I480 Paroxysmal atrial fibrillation: Secondary | ICD-10-CM | POA: Diagnosis not present

## 2021-09-11 DIAGNOSIS — R112 Nausea with vomiting, unspecified: Secondary | ICD-10-CM | POA: Diagnosis not present

## 2021-09-12 DIAGNOSIS — R1312 Dysphagia, oropharyngeal phase: Secondary | ICD-10-CM | POA: Diagnosis not present

## 2021-09-12 DIAGNOSIS — I480 Paroxysmal atrial fibrillation: Secondary | ICD-10-CM | POA: Diagnosis not present

## 2021-09-12 DIAGNOSIS — R1314 Dysphagia, pharyngoesophageal phase: Secondary | ICD-10-CM | POA: Diagnosis not present

## 2021-09-12 DIAGNOSIS — R41841 Cognitive communication deficit: Secondary | ICD-10-CM | POA: Diagnosis not present

## 2021-09-12 DIAGNOSIS — M6281 Muscle weakness (generalized): Secondary | ICD-10-CM | POA: Diagnosis not present

## 2021-09-12 DIAGNOSIS — M797 Fibromyalgia: Secondary | ICD-10-CM | POA: Diagnosis not present

## 2021-09-12 DIAGNOSIS — S728X1D Other fracture of right femur, subsequent encounter for closed fracture with routine healing: Secondary | ICD-10-CM | POA: Diagnosis not present

## 2021-09-13 DIAGNOSIS — S728X1D Other fracture of right femur, subsequent encounter for closed fracture with routine healing: Secondary | ICD-10-CM | POA: Diagnosis not present

## 2021-09-13 DIAGNOSIS — M797 Fibromyalgia: Secondary | ICD-10-CM | POA: Diagnosis not present

## 2021-09-13 DIAGNOSIS — R41841 Cognitive communication deficit: Secondary | ICD-10-CM | POA: Diagnosis not present

## 2021-09-13 DIAGNOSIS — R1312 Dysphagia, oropharyngeal phase: Secondary | ICD-10-CM | POA: Diagnosis not present

## 2021-09-13 DIAGNOSIS — R1314 Dysphagia, pharyngoesophageal phase: Secondary | ICD-10-CM | POA: Diagnosis not present

## 2021-09-13 DIAGNOSIS — I480 Paroxysmal atrial fibrillation: Secondary | ICD-10-CM | POA: Diagnosis not present

## 2021-09-13 DIAGNOSIS — M6281 Muscle weakness (generalized): Secondary | ICD-10-CM | POA: Diagnosis not present

## 2021-09-14 DIAGNOSIS — R41841 Cognitive communication deficit: Secondary | ICD-10-CM | POA: Diagnosis not present

## 2021-09-14 DIAGNOSIS — M6281 Muscle weakness (generalized): Secondary | ICD-10-CM | POA: Diagnosis not present

## 2021-09-14 DIAGNOSIS — I480 Paroxysmal atrial fibrillation: Secondary | ICD-10-CM | POA: Diagnosis not present

## 2021-09-14 DIAGNOSIS — R1314 Dysphagia, pharyngoesophageal phase: Secondary | ICD-10-CM | POA: Diagnosis not present

## 2021-09-14 DIAGNOSIS — M797 Fibromyalgia: Secondary | ICD-10-CM | POA: Diagnosis not present

## 2021-09-14 DIAGNOSIS — S728X1D Other fracture of right femur, subsequent encounter for closed fracture with routine healing: Secondary | ICD-10-CM | POA: Diagnosis not present

## 2021-09-14 DIAGNOSIS — R1312 Dysphagia, oropharyngeal phase: Secondary | ICD-10-CM | POA: Diagnosis not present

## 2021-09-15 DIAGNOSIS — I480 Paroxysmal atrial fibrillation: Secondary | ICD-10-CM | POA: Diagnosis not present

## 2021-09-15 DIAGNOSIS — R1312 Dysphagia, oropharyngeal phase: Secondary | ICD-10-CM | POA: Diagnosis not present

## 2021-09-15 DIAGNOSIS — R1314 Dysphagia, pharyngoesophageal phase: Secondary | ICD-10-CM | POA: Diagnosis not present

## 2021-09-15 DIAGNOSIS — M797 Fibromyalgia: Secondary | ICD-10-CM | POA: Diagnosis not present

## 2021-09-15 DIAGNOSIS — S728X1D Other fracture of right femur, subsequent encounter for closed fracture with routine healing: Secondary | ICD-10-CM | POA: Diagnosis not present

## 2021-09-15 DIAGNOSIS — R41841 Cognitive communication deficit: Secondary | ICD-10-CM | POA: Diagnosis not present

## 2021-09-15 DIAGNOSIS — M6281 Muscle weakness (generalized): Secondary | ICD-10-CM | POA: Diagnosis not present

## 2021-09-17 DIAGNOSIS — R41841 Cognitive communication deficit: Secondary | ICD-10-CM | POA: Diagnosis not present

## 2021-09-17 DIAGNOSIS — R1314 Dysphagia, pharyngoesophageal phase: Secondary | ICD-10-CM | POA: Diagnosis not present

## 2021-09-17 DIAGNOSIS — M6281 Muscle weakness (generalized): Secondary | ICD-10-CM | POA: Diagnosis not present

## 2021-09-17 DIAGNOSIS — R1312 Dysphagia, oropharyngeal phase: Secondary | ICD-10-CM | POA: Diagnosis not present

## 2021-09-17 DIAGNOSIS — M797 Fibromyalgia: Secondary | ICD-10-CM | POA: Diagnosis not present

## 2021-09-17 DIAGNOSIS — I480 Paroxysmal atrial fibrillation: Secondary | ICD-10-CM | POA: Diagnosis not present

## 2021-09-17 DIAGNOSIS — S728X1D Other fracture of right femur, subsequent encounter for closed fracture with routine healing: Secondary | ICD-10-CM | POA: Diagnosis not present

## 2021-09-18 ENCOUNTER — Telehealth: Payer: Self-pay | Admitting: Pharmacist

## 2021-09-18 ENCOUNTER — Telehealth: Payer: Medicare Other

## 2021-09-18 DIAGNOSIS — R1314 Dysphagia, pharyngoesophageal phase: Secondary | ICD-10-CM | POA: Diagnosis not present

## 2021-09-18 DIAGNOSIS — S728X1D Other fracture of right femur, subsequent encounter for closed fracture with routine healing: Secondary | ICD-10-CM | POA: Diagnosis not present

## 2021-09-18 DIAGNOSIS — R1312 Dysphagia, oropharyngeal phase: Secondary | ICD-10-CM | POA: Diagnosis not present

## 2021-09-18 DIAGNOSIS — M6281 Muscle weakness (generalized): Secondary | ICD-10-CM | POA: Diagnosis not present

## 2021-09-18 DIAGNOSIS — I480 Paroxysmal atrial fibrillation: Secondary | ICD-10-CM | POA: Diagnosis not present

## 2021-09-18 DIAGNOSIS — M797 Fibromyalgia: Secondary | ICD-10-CM | POA: Diagnosis not present

## 2021-09-18 DIAGNOSIS — R41841 Cognitive communication deficit: Secondary | ICD-10-CM | POA: Diagnosis not present

## 2021-09-18 NOTE — Telephone Encounter (Signed)
  Chronic Care Management   Outreach Note  09/18/2021 Name: [name] MRN: [id number] DOB: 01/11/1936  Referred by: Jinny Sanders, MD  Patient had a phone appointment scheduled with clinical pharmacist today.  An unsuccessful telephone outreach was attempted today. The patient was referred to the pharmacist for assistance with medications, care management and care coordination.   Patient will NOT be penalized in any way for missing a CCM appointment. The no-show fee does not apply.  Voicemail box was full, unable to leave message.  Charlene Brooke, PharmD, BCACP Clinical Pharmacist [facility] [phone]

## 2021-09-18 NOTE — Progress Notes (Deleted)
Chronic Care Management Pharmacy Note  09/18/2021 Name:  Jaclyn [name] MRN:  [id number] DOB:  [birth year]  Summary: CCM F/U visit ***  Recommendations/Changes made from today's visit: ***  Plan: ***   Subjective: Jaclyn [name] is an 86 y.o. year old female who is a primary patient of Bedsole, Amy E, MD.  The CCM team was consulted for assistance with disease management and care coordination needs.    Engaged with patient by telephone for follow up visit in response to provider referral for pharmacy case management and/or care coordination services.   Consent to Services:  The patient was given information about Chronic Care Management services, agreed to services, and gave verbal consent prior to initiation of services.  Please see initial visit note for detailed documentation.   Patient Care Team: Jinny Sanders, MD as PCP - General (Family Medicine) Josue Hector, MD as PCP - Cardiology (Cardiology) [name], [facility] as Pharmacist (Pharmacist)  Recent office visits: ***  Recent consult visits: [facility] visits: {Hospital DC Yes/No:25215}   Objective:  Lab Results  Component Value Date   CREATININE 0.92 05/27/2021   BUN 13 05/27/2021   GFR 55.46 (L) 12/25/2020   GFRNONAA >60 05/27/2021   GFRAA >60 09/05/2019   NA 136 05/27/2021   K 4.7 05/27/2021   CALCIUM 9.5 05/27/2021   CO2 24 05/27/2021   GLUCOSE 247 (H) 05/27/2021    Lab Results  Component Value Date/Time   HGBA1C 14.4 (H) 12/25/2020 01:27 PM   HGBA1C 14.8 (A) 10/19/2020 03:27 PM   HGBA1C 13.7 (A) 03/16/2020 03:43 PM   HGBA1C 14.2 (H) 08/22/2019 04:59 AM   GFR 55.46 (L) 12/25/2020 01:27 PM   GFR 60.08 09/06/2018 03:48 PM   MICROALBUR 3.2 (H) 05/08/2017 01:49 PM   MICROALBUR 1.0 06/20/2008 11:12 AM    Last diabetic Eye exam:  Lab Results  Component Value Date/Time   HMDIABEYEEXA No Retinopathy 01/10/2014 12:00 AM    Last diabetic Foot exam:  Lab Results  Component Value  Date/Time   HMDIABFOOTEX done 12/18/2017 12:00 AM     Lab Results  Component Value Date   CHOL 204 (H) 08/22/2019   HDL 33 (L) 08/22/2019   LDLCALC 103 (H) 08/22/2019   LDLDIRECT 146.0 09/06/2018   TRIG 338 (H) 08/22/2019   CHOLHDL 6.2 08/22/2019       Latest Ref Rng & Units 05/27/2021    8:30 PM 02/21/2021    1:14 AM 02/15/2021    8:46 PM  Hepatic Function  Total Protein 6.5 - 8.1 g/dL 6.9  5.6  6.8   Albumin 3.5 - 5.0 g/dL 3.2  2.4  3.2   AST 15 - 41 U/L _0 ALT 0 - 44 U/L _1 Alk Phosphatase 38 - 126 U/L 73  54  73   Total Bilirubin 0.3 - 1.2 mg/dL 0.4  0.7  1.6     Lab Results  Component Value Date/Time   TSH 2.239 05/27/2021 08:30 PM   TSH 17.857 (H) 02/23/2021 12:44 AM   TSH 28.31 (H) 12/25/2020 01:27 PM   TSH 68.61 (H) 04/07/2016 05:42 PM   FREET4 0.55 (L) 02/16/2021 06:15 PM   FREET4 0.49 (L) 12/25/2020 01:27 PM       Latest Ref Rng & Units 05/27/2021    8:30 PM 02/24/2021   12:23 AM 02/23/2021   12:44 AM  CBC  WBC 4.0 - 10.5 K/uL 11.1  8.0  5.8   Hemoglobin 12.0 - 15.0 g/dL 12.3  10.5  9.6   Hematocrit 36.0 - 46.0 % 37.8  31.4  28.5   Platelets 150 - 400 K/uL 286  286  250     Lab Results  Component Value Date/Time   VD25OH 17.49 (L) 12/25/2020 01:27 PM   VD25OH 20.67 (L) 09/06/2018 03:48 PM    Clinical ASCVD: {YES/NO:21197} The ASCVD Risk score (Arnett DK, et al., 2019) failed to calculate for the following reasons:   The 2019 ASCVD risk score is only valid for ages 65 to 26   The patient has a prior MI or stroke diagnosis       09/10/2018    3:24 PM 11/23/2015    4:48 PM 10/08/2012   10:00 AM  Depression screen PHQ 2/9  Decreased Interest 3 3 0  Down, Depressed, Hopeless _0 PHQ - 2 Score _1 Altered sleeping 0 1   Tired, decreased energy 3 3   Change in appetite 0 0   Feeling bad or failure about yourself  0 0   Trouble concentrating 0 1   Moving slowly or fidgety/restless 0 0   Suicidal thoughts 0 1   PHQ-9 Score 9  12   Difficult doing work/chores Somewhat difficult       ***Other: (CHADS2VASc if Afib, MMRC or CAT for COPD, ACT, DEXA)  Social History   Tobacco Use  Smoking Status Never  Smokeless Tobacco Never   BP Readings from Last 3 Encounters:  05/28/21 118/78  04/01/21 112/72  02/25/21 109/64   Pulse Readings from Last 3 Encounters:  05/28/21 65  04/01/21 (!) 101  02/25/21 (!) 102   Wt Readings from Last 3 Encounters:  05/27/21 140 lb 3.4 oz (63.6 kg)  02/21/21 139 lb 12.4 oz (63.4 kg)  10/19/20 151 lb (68.5 kg)   BMI Readings from Last 3 Encounters:  05/27/21 22.63 kg/m  04/01/21 25.56 kg/m  02/21/21 25.56 kg/m    Assessment/Interventions: Review of patient past medical history, allergies, medications, health status, including review of consultants reports, laboratory and other test data, was performed as part of comprehensive evaluation and provision of chronic care management services.   SDOH:  (Social Determinants of Health) assessments and interventions performed: {yes/no:20286}  SDOH Screenings   Alcohol Screen: Not on file  Depression (PHQ2-9): Medium Risk (09/10/2018)   Depression (PHQ2-9)    PHQ-2 Score: 9  Financial Resource Strain: High Risk (02/06/2021)   Overall Financial Resource Strain (CARDIA)    Difficulty of Paying Living Expenses: Hard  Food Insecurity: Not on file  Housing: Not on file  Physical Activity: Not on file  Social Connections: Not on file  Stress: Not on file  Tobacco Use: Low Risk  (05/27/2021)   Patient History    Smoking Tobacco Use: Never    Smokeless Tobacco Use: Never    Passive Exposure: Not on file  Transportation Needs: Not on file    CCM Care Plan  Allergies  Allergen Reactions   Naproxen Sodium Other (See Comments)    Fever/aches and pains   Statins Other (See Comments)    myalgias   Sulfonamide Derivatives Hives and Itching   Latex Itching and Rash   Shellfish Allergy Itching, Swelling and Rash    Seafood,  shrimp    Medications Reviewed Today     Reviewed by Jacinta Shoe, CMA (Certified Medical Assistant) on 04/01/21 at [place] [facility] Status: <None>  Medication Order Taking? Sig Documenting Provider Last Dose Status Informant  apixaban (ELIQUIS) 5 MG TABS tablet [id number] Yes TAKE 1 TABLET BY MOUTH TWICE A DAY  Patient taking differently: Take 5 mg by mouth 2 (two) times daily.   Jinny Sanders, MD Taking Active Child  B-D ULTRAFINE III SHORT PEN 31G X 8 MM MISC [id number] Yes USE TO INJECT INSULIN ONCE DAILY. DX: E11.65 Jinny Sanders, MD Taking Active Child  Cholecalciferol (VITAMIN D3) 1.25 MG (50000 UT) CAPS [id number] Yes Take 1 capsule by mouth once a week.  Patient taking differently: Take 50,000 Units by mouth once a week.   Jinny Sanders, MD Taking Active Child  DULoxetine (CYMBALTA) 30 MG capsule [id number] Yes TAKE 2 CAPSULES BY MOUTH EVERY DAY  Patient taking differently: Take 60 mg by mouth daily.   Jinny Sanders, MD Taking Active Child  HUMALOG 100 UNIT/ML injection [id number] Yes Inject into the skin as directed. [provider] Taking Active   insulin aspart (NOVOLOG) 100 UNIT/ML FlexPen [id number] Yes Before each meal 3 times a day, 140-199 - 2 units, 200-250 - 6 units, 251-299 - 8 units,  300-349 - 10 units,  350 or above 12 units. Thurnell Lose, MD Taking Active   insulin glargine (LANTUS SOLOSTAR) 100 UNIT/ML Solostar Pen [id number] Yes Inject 42 Units into the skin 2 (two) times daily. Thurnell Lose, MD Taking Active   JANUVIA 100 MG tablet [id number] Yes TAKE 1 TABLET BY MOUTH EVERY DAY  Patient taking differently: Take 100 mg by mouth daily.   Jinny Sanders, MD Taking Active Child  levothyroxine (SYNTHROID) 175 MCG tablet [id number] Yes Take 1 tablet (175 mcg total) by mouth daily. Thurnell Lose, MD Taking Active   trimethoprim (TRIMPEX) 100 MG tablet [id number] Yes TAKE 1 TABLET BY MOUTH EVERY DAY  Patient taking differently: Take 100 mg by mouth  daily.   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   Recurrent UTI    Dyslipidemia    Orthostasis    Dysarthria    Vagina, candidiasis 03/10/2019   Dementia ([place]) 03/10/2019   Acute respiratory failure with hypoxia ([place]) 17/40/8144   Acute metabolic encephalopathy 81/85/6314   COVID-19 virus infection 01/15/2019   Acute lower UTI 01/15/2019   B12 deficiency 01/22/2018   History of CVA (cerebrovascular accident) 06/20/2017   Stenosis of right carotid artery    Peripheral edema 07/29/2016   Imbalance 04/07/2016   Intertriginous candidiasis 04/26/2015   Hard of hearing 04/12/2015   TIA (transient ischemic attack) 04/10/2015   History of recurrent UTIs 04/10/2015   Dysuria 03/09/2015   Tremor 05/11/2014   Leg weakness, bilateral 05/11/2014   Fatty liver 08/04/2013   Personal history of colonic polyps 10/05/2012   Esophageal reflux 10/05/2012   Carotid stenosis, symptomatic, with infarction ([place]) 10/14/2010   MDD (major depressive disorder), single episode, moderate ([place]) 01/13/2008   Essential hypertension,  benign 01/13/2008   PAROXYSMAL ATRIAL FIBRILLATION 01/13/2008   Intracranial vascular stenosis 01/13/2008   DIVERTICULOSIS OF COLON 01/13/2008   Fibromyalgia 01/13/2008   CHEST PAIN, ATYPICAL 01/13/2008   Hypothyroidism 04/13/2007   Poorly controlled type 2 diabetes mellitus with complication ([place]) 81/02/7508   Vitamin D deficiency 04/13/2007   Hyperlipidemia LDL goal <70  04/13/2007   DEGENERATIVE JOINT DISEASE 04/13/2007    Immunization History  Administered Date(s) Administered   Fluad Quad(high Dose 65+) 01/20/2019   Influenza Split 02/03/2011, 01/20/2012   Influenza Whole 11/12/2007, 12/18/2008   Influenza,inj,Quad PF,6+ Mos 11/09/2014, 11/23/2015   Pneumococcal Conjugate-13 03/09/2015   Pneumococcal Polysaccharide-23 10/19/2007   Tdap 06/02/2011    Conditions to be addressed/monitored:  {USCCMDZASSESSMENTOPTIONS:23563}  There are no care plans that you recently modified to display for this patient.    Medication Assistance: {MEDASSISTANCEINFO:25044}  Compliance/Adherence/Medication fill history: Care Gaps: DEXA (never done) Eye exam (due 01/11/15) Colonoscopy (due 10/29/17) Foot exam (due 12/19/18) A1c (due 06/24/21)  Star-Rating Drugs: Januvia - PDC 63%  Medication Access: Within the past 30 days, how often has patient missed a dose of medication? *** Is a pillbox or other method used to improve adherence? {YES/NO:21197} Factors that may affect medication adherence? {CHL DESC; BARRIERS:21522} Are meds synced by current pharmacy? {YES/NO:21197} Are meds delivered by current pharmacy? {YES/NO:21197} Does patient experience delays in picking up medications due to transportation concerns? {YES/NO:21197}  [facility] Reviewed: Is patient disadvantaged to use [facility]?: {YES/NO:21197} Current Rx insurance plan: *** Name and location of Current pharmacy:  [id number] [place] [place]Phone: [phone]: [phone] [address] [address]Phone: [phone]: [phone] [facility] services reviewed with patient today?: {YES/NO:21197} Patient requests to transfer care to [facility]?: {YES/NO:21197} Reason patient  declined to change pharmacies: {US patient preference:27474}   Care Plan and Follow Up Patient Decision:  {FOLLOWUP:24991}  Plan: {CM FOLLOW UP PLAN:25073}  ***    Current Barriers:  Unable to independently afford treatment regimen  Unable to achieve control of diabetes - has not started Trulicity   Pharmacist Clinical Goal(s):  Over the next 30 days, patient will verbalize ability to afford treatment regimen achieve adherence to monitoring guidelines and medication adherence to achieve therapeutic efficacy through collaboration with PharmD and provider.    Current Barriers:  {pharmacybarriers:24917}  Pharmacist Clinical Goal(s):  Patient will {PHARMACYGOALCHOICES:24921} through collaboration with PharmD and provider.   Interventions: 1:1 collaboration with BJinny Sanders MD regarding development and update of comprehensive plan of care as evidenced by provider attestation and co-signature Inter-disciplinary care team collaboration (see longitudinal plan of care) Comprehensive medication review performed; medication list updated in electronic medical record  Hypertension (BP goal <140/90) -Controlled -Current medications:  None -Medications previously tried: Midodrine 5 mg - 1 BID breakfast and lunch (pt reports stopped per vascular surgery) -Current home readings: none -She does have a home BP monitor with wrist cuff, has not checked recently. -Denies hypotensive/hypertensive symptoms - occasional dizziness upon standing  -Educated on Importance of home blood pressure monitoring; Proper BP monitoring technique; -Counseled to monitor BP at home with symptoms of low BP (arm cuff recommended), document, and provide log at future appointments  Hyperlipidemia: (LDL goal < 70, h/o CVA) -Not ideally controlled - due for lipid panel -Current treatment: None -Medications previously tried: atorvastatin -Educated on  Benefits of statin for ASCVD risk reduction;  No refill  history, but pt confirms taking. Statin started 08/24/2019. Per discharge note CVA 08/2019, patient is to continue statin, aspirin and Eliquis. -Recommended to continue current medication; Encourage adherence to statin and aspirin.  Diabetes (A1c goal <8%) -Uncontrolled - A1c 14.4% (12/2020) above goal -Current medications: Januvia 100 mg daily Lantus Solostar - 42 units daily Humalog 2-12 units with meals -Medications previously tried: metformin - CKD, Humulin -Educated onA1c and blood sugar goals; Complications of diabetes including kidney damage, retinal damage, and cardiovascular disease; Benefits of routine self-monitoring of blood sugar; -Counseled to check feet daily and get yearly eye exams -Recommended to continue current medication  Atrial Fibrillation (Goal: prevent stroke and major bleeding) -Controlled -CHADSVASC: 7  -Current treatment: Eliquis 2.5 mg BID -Medications previously tried: none -Home BP and HR readings: none reported   -Counseled on increased risk of stroke due to Afib and benefits of anticoagulation for stroke prevention; importance of adherence to anticoagulant exactly as prescribed; -Recommended to continue current medication  Thryoid (Goal: TSH WNL) -Controlled -Current treatment  Levothyroxine 175 mcg daily  -Medications previously tried: none  -Recommended to continue current medication   *** (Goal: ***) -{US controlled/uncontrolled:25276} -Current treatment  Duloxetine 60 mg daily Trimethoprim 100 mg daily -Medications previously tried: ***  -{CCMPHARMDINTERVENTION:25122}  Patient Goals/Self-Care Activities Patient will:  - {pharmacypatientgoals:24919}

## 2021-09-19 DIAGNOSIS — S728X1D Other fracture of right femur, subsequent encounter for closed fracture with routine healing: Secondary | ICD-10-CM | POA: Diagnosis not present

## 2021-09-19 DIAGNOSIS — I480 Paroxysmal atrial fibrillation: Secondary | ICD-10-CM | POA: Diagnosis not present

## 2021-09-19 DIAGNOSIS — M6281 Muscle weakness (generalized): Secondary | ICD-10-CM | POA: Diagnosis not present

## 2021-09-19 DIAGNOSIS — R1312 Dysphagia, oropharyngeal phase: Secondary | ICD-10-CM | POA: Diagnosis not present

## 2021-09-19 DIAGNOSIS — R1314 Dysphagia, pharyngoesophageal phase: Secondary | ICD-10-CM | POA: Diagnosis not present

## 2021-09-19 DIAGNOSIS — R41841 Cognitive communication deficit: Secondary | ICD-10-CM | POA: Diagnosis not present

## 2021-09-19 DIAGNOSIS — M797 Fibromyalgia: Secondary | ICD-10-CM | POA: Diagnosis not present

## 2021-09-20 DIAGNOSIS — R1314 Dysphagia, pharyngoesophageal phase: Secondary | ICD-10-CM | POA: Diagnosis not present

## 2021-09-20 DIAGNOSIS — R41841 Cognitive communication deficit: Secondary | ICD-10-CM | POA: Diagnosis not present

## 2021-09-20 DIAGNOSIS — M6281 Muscle weakness (generalized): Secondary | ICD-10-CM | POA: Diagnosis not present

## 2021-09-20 DIAGNOSIS — S728X1D Other fracture of right femur, subsequent encounter for closed fracture with routine healing: Secondary | ICD-10-CM | POA: Diagnosis not present

## 2021-09-20 DIAGNOSIS — I480 Paroxysmal atrial fibrillation: Secondary | ICD-10-CM | POA: Diagnosis not present

## 2021-09-20 DIAGNOSIS — R1312 Dysphagia, oropharyngeal phase: Secondary | ICD-10-CM | POA: Diagnosis not present

## 2021-09-20 DIAGNOSIS — M797 Fibromyalgia: Secondary | ICD-10-CM | POA: Diagnosis not present

## 2021-10-04 DIAGNOSIS — M6281 Muscle weakness (generalized): Secondary | ICD-10-CM | POA: Diagnosis not present

## 2021-11-04 DIAGNOSIS — E118 Type 2 diabetes mellitus with unspecified complications: Secondary | ICD-10-CM | POA: Diagnosis not present

## 2021-11-04 DIAGNOSIS — E785 Hyperlipidemia, unspecified: Secondary | ICD-10-CM | POA: Diagnosis not present

## 2021-11-04 DIAGNOSIS — M6281 Muscle weakness (generalized): Secondary | ICD-10-CM | POA: Diagnosis not present

## 2021-11-04 DIAGNOSIS — E039 Hypothyroidism, unspecified: Secondary | ICD-10-CM | POA: Diagnosis not present

## 2021-11-15 ENCOUNTER — Emergency Department (HOSPITAL_COMMUNITY): Payer: Medicare Other

## 2021-11-15 ENCOUNTER — Other Ambulatory Visit: Payer: Self-pay

## 2021-11-15 ENCOUNTER — Observation Stay (HOSPITAL_COMMUNITY)
Admission: EM | Admit: 2021-11-15 | Discharge: 2021-11-18 | Disposition: A | Discharge status: Skilled Nursing Facility | Payer: Medicare Other | Source: Skilled Nursing Facility | Attending: Family Medicine | Admitting: Family Medicine

## 2021-11-15 DIAGNOSIS — Z882 Allergy status to sulfonamides status: Secondary | ICD-10-CM | POA: Diagnosis not present

## 2021-11-15 DIAGNOSIS — Z7901 Long term (current) use of anticoagulants: Secondary | ICD-10-CM

## 2021-11-15 DIAGNOSIS — Z83438 Family history of other disorder of lipoprotein metabolism and other lipidemia: Secondary | ICD-10-CM

## 2021-11-15 DIAGNOSIS — E039 Hypothyroidism, unspecified: Secondary | ICD-10-CM | POA: Diagnosis present

## 2021-11-15 DIAGNOSIS — E860 Dehydration: Secondary | ICD-10-CM | POA: Diagnosis present

## 2021-11-15 DIAGNOSIS — M1990 Unspecified osteoarthritis, unspecified site: Secondary | ICD-10-CM | POA: Diagnosis present

## 2021-11-15 DIAGNOSIS — Z807 Family history of other malignant neoplasms of lymphoid, hematopoietic and related tissues: Secondary | ICD-10-CM

## 2021-11-15 DIAGNOSIS — Z91040 Latex allergy status: Secondary | ICD-10-CM | POA: Diagnosis not present

## 2021-11-15 DIAGNOSIS — I10 Essential (primary) hypertension: Secondary | ICD-10-CM | POA: Diagnosis not present

## 2021-11-15 DIAGNOSIS — E038 Other specified hypothyroidism: Secondary | ICD-10-CM | POA: Diagnosis not present

## 2021-11-15 DIAGNOSIS — F32A Depression, unspecified: Secondary | ICD-10-CM | POA: Diagnosis not present

## 2021-11-15 DIAGNOSIS — M797 Fibromyalgia: Secondary | ICD-10-CM | POA: Diagnosis present

## 2021-11-15 DIAGNOSIS — R4182 Altered mental status, unspecified: Secondary | ICD-10-CM | POA: Insufficient documentation

## 2021-11-15 DIAGNOSIS — Z823 Family history of stroke: Secondary | ICD-10-CM

## 2021-11-15 DIAGNOSIS — E785 Hyperlipidemia, unspecified: Secondary | ICD-10-CM | POA: Diagnosis present

## 2021-11-15 DIAGNOSIS — Z886 Allergy status to analgesic agent status: Secondary | ICD-10-CM | POA: Diagnosis not present

## 2021-11-15 DIAGNOSIS — F0393 Unspecified dementia, unspecified severity, with mood disturbance: Secondary | ICD-10-CM | POA: Diagnosis present

## 2021-11-15 DIAGNOSIS — Z7985 Long-term (current) use of injectable non-insulin antidiabetic drugs: Secondary | ICD-10-CM

## 2021-11-15 DIAGNOSIS — Z7409 Other reduced mobility: Secondary | ICD-10-CM | POA: Diagnosis not present

## 2021-11-15 DIAGNOSIS — B961 Klebsiella pneumoniae [K. pneumoniae] as the cause of diseases classified elsewhere: Secondary | ICD-10-CM | POA: Diagnosis present

## 2021-11-15 DIAGNOSIS — Z794 Long term (current) use of insulin: Secondary | ICD-10-CM | POA: Diagnosis not present

## 2021-11-15 DIAGNOSIS — Z87440 Personal history of urinary (tract) infections: Secondary | ICD-10-CM

## 2021-11-15 DIAGNOSIS — Z79890 Hormone replacement therapy (postmenopausal): Secondary | ICD-10-CM

## 2021-11-15 DIAGNOSIS — I480 Paroxysmal atrial fibrillation: Secondary | ICD-10-CM | POA: Diagnosis present

## 2021-11-15 DIAGNOSIS — E119 Type 2 diabetes mellitus without complications: Secondary | ICD-10-CM | POA: Diagnosis not present

## 2021-11-15 DIAGNOSIS — E1165 Type 2 diabetes mellitus with hyperglycemia: Secondary | ICD-10-CM | POA: Diagnosis not present

## 2021-11-15 DIAGNOSIS — Z7984 Long term (current) use of oral hypoglycemic drugs: Secondary | ICD-10-CM

## 2021-11-15 DIAGNOSIS — Z79899 Other long term (current) drug therapy: Secondary | ICD-10-CM | POA: Insufficient documentation

## 2021-11-15 DIAGNOSIS — Z888 Allergy status to other drugs, medicaments and biological substances status: Secondary | ICD-10-CM

## 2021-11-15 DIAGNOSIS — Z803 Family history of malignant neoplasm of breast: Secondary | ICD-10-CM

## 2021-11-15 DIAGNOSIS — N390 Urinary tract infection, site not specified: Principal | ICD-10-CM | POA: Diagnosis present

## 2021-11-15 DIAGNOSIS — Z91013 Allergy to seafood: Secondary | ICD-10-CM

## 2021-11-15 DIAGNOSIS — Z8673 Personal history of transient ischemic attack (TIA), and cerebral infarction without residual deficits: Secondary | ICD-10-CM

## 2021-11-15 DIAGNOSIS — Z8249 Family history of ischemic heart disease and other diseases of the circulatory system: Secondary | ICD-10-CM

## 2021-11-15 DIAGNOSIS — R262 Difficulty in walking, not elsewhere classified: Secondary | ICD-10-CM | POA: Insufficient documentation

## 2021-11-15 DIAGNOSIS — N3001 Acute cystitis with hematuria: Principal | ICD-10-CM

## 2021-11-15 DIAGNOSIS — R456 Violent behavior: Secondary | ICD-10-CM | POA: Diagnosis not present

## 2021-11-15 DIAGNOSIS — G9341 Metabolic encephalopathy: Secondary | ICD-10-CM | POA: Diagnosis present

## 2021-11-15 DIAGNOSIS — I4891 Unspecified atrial fibrillation: Secondary | ICD-10-CM | POA: Diagnosis present

## 2021-11-15 LAB — MAGNESIUM: Magnesium: 1.3 mg/dL — ABNORMAL LOW (ref 1.7–2.4)

## 2021-11-15 LAB — COMPREHENSIVE METABOLIC PANEL
ALT: 10 U/L (ref 0–44)
AST: 17 U/L (ref 15–41)
Albumin: 3.1 g/dL — ABNORMAL LOW (ref 3.5–5.0)
Alkaline Phosphatase: 49 U/L (ref 38–126)
Anion gap: 10 (ref 5–15)
BUN: 13 mg/dL (ref 8–23)
CO2: 24 mmol/L (ref 22–32)
Calcium: 8.6 mg/dL — ABNORMAL LOW (ref 8.9–10.3)
Chloride: 107 mmol/L (ref 98–111)
Creatinine, Ser: 0.68 mg/dL (ref 0.44–1.00)
GFR, Estimated: >60 mL/min (ref >60)
Glucose, Bld: 103 mg/dL — ABNORMAL HIGH (ref 70–99)
Potassium: 3.9 mmol/L (ref 3.5–5.1)
Sodium: 141 mmol/L (ref 135–145)
Total Bilirubin: 0.6 mg/dL (ref 0.3–1.2)
Total Protein: 6 g/dL — ABNORMAL LOW (ref 6.5–8.1)

## 2021-11-15 LAB — CBC WITH DIFFERENTIAL/PLATELET
Abs Immature Granulocytes: 0.04 10*3/uL (ref 0.00–0.07)
Basophils Absolute: 0 10*3/uL (ref 0.0–0.1)
Basophils Relative: 0 %
Eosinophils Absolute: 0.1 10*3/uL (ref 0.0–0.5)
Eosinophils Relative: 1 %
HCT: 34.8 % — ABNORMAL LOW (ref 36.0–46.0)
Hemoglobin: 11.7 g/dL — ABNORMAL LOW (ref 12.0–15.0)
Immature Granulocytes: 0 %
Lymphocytes Relative: 22 %
Lymphs Abs: 2 10*3/uL (ref 0.7–4.0)
MCH: 30 pg (ref 26.0–34.0)
MCHC: 33.6 g/dL (ref 30.0–36.0)
MCV: 89.2 fL (ref 80.0–100.0)
Monocytes Absolute: 0.7 10*3/uL (ref 0.1–1.0)
Monocytes Relative: 8 %
Neutro Abs: 6.4 10*3/uL (ref 1.7–7.7)
Neutrophils Relative %: 69 %
Platelets: 237 10*3/uL (ref 150–400)
RBC: 3.9 MIL/uL (ref 3.87–5.11)
RDW: 14.1 % (ref 11.5–15.5)
WBC: 9.3 10*3/uL (ref 4.0–10.5)
nRBC: 0 % (ref 0.0–0.2)

## 2021-11-15 LAB — URINALYSIS, ROUTINE W REFLEX MICROSCOPIC
Bilirubin Urine: NEGATIVE
Glucose, UA: NEGATIVE mg/dL
Ketones, ur: NEGATIVE mg/dL
Nitrite: POSITIVE — AB
Protein, ur: 30 mg/dL — AB
Specific Gravity, Urine: 1.02 (ref 1.005–1.030)
pH: 8 (ref 5.0–8.0)

## 2021-11-15 LAB — URINALYSIS, MICROSCOPIC (REFLEX): WBC, UA: >50 WBC/hpf (ref 0–5)

## 2021-11-15 LAB — CBG MONITORING, ED: Glucose-Capillary: 106 mg/dL — ABNORMAL HIGH (ref 70–99)

## 2021-11-15 LAB — AMMONIA: Ammonia: 13 umol/L (ref 9–35)

## 2021-11-15 MED ORDER — INSULIN ASPART 100 UNIT/ML IJ SOLN
0.0000 [IU] | Freq: Every day | INTRAMUSCULAR | Status: DC
  Administered 2021-11-16: 4 [IU] via SUBCUTANEOUS
  Administered 2021-11-17: 5 [IU] via SUBCUTANEOUS

## 2021-11-15 MED ORDER — ACETAMINOPHEN 650 MG RE SUPP: 650.0000 mg | Freq: Four times a day (QID) | RECTAL | Status: DC | PRN

## 2021-11-15 MED ORDER — MAGNESIUM SULFATE 2 GM/50ML IV SOLN
2.0000 g | Freq: Once | INTRAVENOUS | Status: AC
  Administered 2021-11-15: 2 g via INTRAVENOUS
  Filled 2021-11-15: qty 50

## 2021-11-15 MED ORDER — LORAZEPAM 2 MG/ML IJ SOLN: 1.0000 mg | Freq: Once | INTRAMUSCULAR | Status: DC

## 2021-11-15 MED ORDER — SODIUM CHLORIDE 0.9 % IV SOLN
1.0000 g | INTRAVENOUS | Status: DC
  Administered 2021-11-15 – 2021-11-17 (×3): 1 g via INTRAVENOUS
  Filled 2021-11-15 (×3): qty 10

## 2021-11-15 MED ORDER — LACTATED RINGERS IV BOLUS
1000.0000 mL | Freq: Once | INTRAVENOUS | Status: AC
  Administered 2021-11-15: 1000 mL via INTRAVENOUS

## 2021-11-15 MED ORDER — MIDAZOLAM HCL 2 MG/2ML IJ SOLN
1.0000 mg | Freq: Once | INTRAMUSCULAR | Status: AC
  Administered 2021-11-15: 1 mg via INTRAVENOUS
  Filled 2021-11-15: qty 2

## 2021-11-15 MED ORDER — ACETAMINOPHEN 325 MG PO TABS: 650.0000 mg | ORAL_TABLET | Freq: Four times a day (QID) | ORAL | Status: DC | PRN

## 2021-11-15 MED ORDER — INSULIN ASPART 100 UNIT/ML IJ SOLN
0.0000 [IU] | Freq: Three times a day (TID) | INTRAMUSCULAR | Status: DC
  Administered 2021-11-16 (×2): 2 [IU] via SUBCUTANEOUS
  Administered 2021-11-16: 3 [IU] via SUBCUTANEOUS
  Administered 2021-11-17: 2 [IU] via SUBCUTANEOUS
  Administered 2021-11-17 (×2): 3 [IU] via SUBCUTANEOUS
  Administered 2021-11-18 (×3): 5 [IU] via SUBCUTANEOUS

## 2021-11-15 NOTE — ED Provider Notes (Signed)
[name] Provider Note   CSN: [id number] Arrival date & time: 11/15/21  1946     History {Add pertinent medical, surgical, social history, OB history to HPI:1} Chief Complaint  Patient presents with   Altered Mental Status    [name] is a 86 y.o. female.   Altered Mental Status      Home Medications Prior to Admission medications   Medication Sig Start Date End Date Taking? Authorizing Provider  apixaban (ELIQUIS) 5 MG TABS tablet TAKE 1 TABLET BY MOUTH TWICE A DAY Patient taking differently: Take 5 mg by mouth 2 (two) times daily. 12/11/20   Bedsole, Amy E, MD  B-D ULTRAFINE III SHORT PEN 31G X 8 MM MISC USE TO INJECT INSULIN ONCE DAILY. DX: E11.65 07/02/20   Bedsole, Amy E, MD  Cholecalciferol (VITAMIN D3) 1.25 MG (50000 UT) CAPS Take 1 capsule by mouth once a week. Patient taking differently: Take 50,000 Units by mouth once a week. 12/27/20   Bedsole, Amy E, MD  DULoxetine (CYMBALTA) 30 MG capsule TAKE 2 CAPSULES BY MOUTH EVERY DAY Patient taking differently: Take 60 mg by mouth daily. 12/27/19   Bedsole, Amy E, MD  HUMALOG 100 UNIT/ML injection Inject into the skin as directed. 03/29/21   [provider]  insulin aspart (NOVOLOG) 100 UNIT/ML FlexPen Before each meal 3 times a day, 140-199 - 2 units, 200-250 - 6 units, 251-299 - 8 units,  300-349 - 10 units,  350 or above 12 units. 02/25/21   Thurnell Lose, MD  insulin glargine (LANTUS SOLOSTAR) 100 UNIT/ML Solostar Pen Inject 42 Units into the skin 2 (two) times daily. 02/25/21   Thurnell Lose, MD  JANUVIA 100 MG tablet TAKE 1 TABLET BY MOUTH EVERY DAY Patient taking differently: Take 100 mg by mouth daily. 01/07/21   Bedsole, Amy E, MD  levothyroxine (SYNTHROID) 175 MCG tablet Take 1 tablet (175 mcg total) by mouth daily. 02/25/21   Thurnell Lose, MD  nitrofurantoin, macrocrystal-monohydrate, (MACROBID) 100 MG capsule Take 1 capsule (100 mg total) by mouth 2  (two) times daily. 05/27/21   Dorothyann Peng, PA-C  trimethoprim (TRIMPEX) 100 MG tablet TAKE 1 TABLET BY MOUTH EVERY DAY Patient taking differently: Take 100 mg by mouth daily. 12/12/20   Jinny Sanders, MD      Allergies    Naproxen sodium, Statins, Sulfonamide derivatives, Latex, and Shellfish allergy    Review of Systems   Review of Systems  Physical Exam Updated Vital Signs BP 124/82   Pulse 80   Temp 98.1 F (36.7 C) (Oral)   Resp 17   SpO2 97%  Physical Exam  ED Results / Procedures / Treatments   Labs (all labs ordered are listed, but only abnormal results are displayed) Labs Reviewed  CBG MONITORING, ED - Abnormal; Notable for the following components:      Result Value   Glucose-Capillary 106 (*)    All other components within normal limits    EKG None  Radiology No results found.  Procedures Procedures  {Document cardiac monitor, telemetry assessment procedure when appropriate:1}  Medications Ordered in ED Medications - No data to display  ED Course/ Medical Decision Making/ A&P                           Medical Decision Making  Medical Decision Making  This patient is Presenting for Evaluation of ***, which {Range:23949} require a  range of treatment options, and {MDMcomplaint:23950} a complaint that involves a {MDMlevelrisk:23951} risk of morbidity and mortality.  Arrived in ED by: POV*** EMS*** History obtained from: The patient***  Limitations in history: none***    At this time I am most concerned for ***. Also considering ***. Plan for ***    ***I interpreted the ECG. It reveals a sinus rhythm. The QTc, PR, and QRS are appropriate. There are no signs of acute ischemia or of significant electrical abnormalities. The ECG does not show a STEMI. There are no ST depressions. ***There are no T wave inversions. There is no evidence of a High-Grade Conduction Block.   Laboratory work-up significant for:   ***   Radiologic work-up was  significant for:  ***   Interventions and Interval History: ***         Consults:  *** Recommendations: ***   Decision rules/scores evaluated: ***   Additional documents reviewed:     Disposition: Due to the patients current presenting symptoms, physical exam findings, and the workup stated above, it is thought that the etiology of the patients current presentation is ***    ***ADMIT: Patient is thought to require admission for ***. Patient will be admitted to *** service. Please see in patient provider note for additional treatment plan details.   ***Discharge: Patient is felt to be medically appropriate for discharge at this time. Patient was informed of all pertinent physical exam, laboratory, and imaging findings. Patients suspected etiology of their symptom presentation was discussed with the patient and all questions were answered. Patient was instructed to follow up with their primary care doctor in *** days for re-evaluation. Patient was given strict return precautions.   ***Handoff: At the time of signout, the patients *** had not yet been completed. Handoff was provided to Dr. Marland Kitchen , please see their note for additional treatment plan details.   The plan for this patient was discussed with Dr. ***, who voiced agreement and who oversaw evaluation and treatment of this patient.     Clinical Complexity  A medically appropriate history, review of systems, and physical exam was performed.   I personally reviewed the lab and imaging studies discussed above.   MDM generated using voice dictation software and may contain dictation errors. Please contact me for any clarification or with any questions.         {Document critical care time when appropriate:1} {Document review of labs and clinical decision tools ie heart score, Chads2Vasc2 etc:1}  {Document your independent review of radiology images, and any outside records:1} {Document your discussion with  family members, caretakers, and with consultants:1} {Document social determinants of health affecting pt's care:1} {Document your decision making why or why not admission, treatments were needed:1} Final Clinical Impression(s) / ED Diagnoses Final diagnoses:  None    Rx / DC Orders ED Discharge Orders     None

## 2021-11-15 NOTE — ED Notes (Signed)
Patient in room tugging on her leads, attempting to pull off pulse ox off of finger. Patient repositioned, reports "Oh my stomach hurts" unable to elaborate any further about the pain.

## 2021-11-15 NOTE — ED Triage Notes (Addendum)
BIB EMS from a rehab. On scene, son reported pt has a decline in mental status for x 2 days, becoming more combative/violent, more sleepy.

## 2021-11-15 NOTE — H&P (Incomplete)
History and Physical    Jaclyn [name] ONG:[id number] DOB: January 20, 1936 DOA: 11/15/2021  PCP: Jinny Sanders, MD  Patient coming from: Nursing home  Chief Complaint: Altered mental status  HPI: [name] is a 86 y.o. female with medical history significant of dementia, paroxysmal A-fib on Eliquis, fibromyalgia, hypertension, poorly controlled type 2 diabetes, hypothyroidism, TIA presents to the ED via EMS from her nursing home for evaluation of decline in mental status for the past 2 days, becoming more combative/violent alternating with somnolence.  Vital signs stable on arrival to the ED.  Labs showing no leukocytosis, hemoglobin 11.7 (at baseline), glucose 103, creatinine 0.6, LFTs normal, magnesium 1.3, UA with signs of infection (positive nitrite, moderate leukocytes, and microscopy showing >50 WBCs and many bacteria), ammonia normal.  Chest x-ray showing no active disease.  CT head negative for acute finding. Patient was given Versed, ceftriaxone, 1 L LR bolus, and IV magnesium 2 g.  Patient is very confused and not able to give any history.  She is oriented to self only.  History provided by son at bedside who reports patient having history of confusion due to UTIs in the past.  States yesterday when he visited her at her nursing home, he noticed that she was confused.  Today when he went back to check on her, she appeared more confused and agitated.  Son states at baseline patient is oriented to person and place and able to have a conversation.  Review of Systems:  Review of Systems  Unable to perform ROS: Mental status change    Past Medical History:  Diagnosis Date   Atrial fibrillation ([place])    Benign neoplasm of colon    Carotid artery occlusion    60-79% right ICA stenosis   Difficult intubation 02/17/2006   During surgery to remove large polyp   Diverticulosis of colon (without mention of hemorrhage)    Dysthymic disorder    Fibromyalgia    Headache(784.0)     Insomnia, unspecified    Interstitial cystitis    Osteoarthrosis, unspecified whether generalized or localized, unspecified site    Other and unspecified hyperlipidemia    Other chest pain    Other specified benign mammary dysplasias    TIA (transient ischemic attack)    Type II or unspecified type diabetes mellitus without mention of complication, not stated as uncontrolled    Unspecified essential hypertension    Unspecified hypothyroidism    Unspecified vitamin D deficiency    Urinary tract infection, site not specified     Past Surgical History:  Procedure Laterality Date   ABDOMINAL HYSTERECTOMY  1988   cervical dysplasia   CARDIAC CATHETERIZATION  02/19/06   EF 60%   COLONOSCOPY     ENDARTERECTOMY Right 08/26/2019   Procedure: RIGHT CAROTID ENDARTERECTOMY;  Surgeon: Rosetta Posner, MD;  Location: [facility] OR;  Service: Vascular;  Laterality: Right;   PATCH ANGIOPLASTY Right 08/26/2019   Procedure: PATCH ANGIOPLASTY OF RIGHT COMMON CAROTID ARTERY USING HEMASHIELD PLATINUM FINESSE PATCH;  Surgeon: Rosetta Posner, MD;  Location: [facility] OR;  Service: Vascular;  Laterality: Right;   RIGHT COLECTOMY  2005   for villous adenoma of the cecum Dr.streck   VESICOVAGINAL FISTULA CLOSURE W/ TAH  1990   w/ cystocele &retocele repairs Dr. Ree Edman     reports that she has never smoked. She has never used smokeless tobacco. She reports that she does not drink alcohol and does not use drugs.  Allergies  Allergen Reactions   Naproxen Sodium  Other (See Comments)    Fever/aches and pains   Statins Other (See Comments)    myalgias   Sulfonamide Derivatives Hives and Itching   Latex Itching and Rash   Shellfish Allergy Itching, Swelling and Rash    Seafood, shrimp    Family History  Problem Relation Age of Onset   Lymphoma Father    Hypertension Father    Stroke Father    Hyperlipidemia Father    Hypertension Mother    Allergies Brother    Hypertension Sister        3   Breast cancer Sister     Fibromyalgia Sister        3   Hyperlipidemia Sister        3    Prior to Admission medications   Medication Sig Start Date End Date Taking? Authorizing Provider  apixaban (ELIQUIS) 5 MG TABS tablet TAKE 1 TABLET BY MOUTH TWICE A DAY Patient taking differently: Take 5 mg by mouth 2 (two) times daily. 12/11/20   Bedsole, Amy E, MD  B-D ULTRAFINE III SHORT PEN 31G X 8 MM MISC USE TO INJECT INSULIN ONCE DAILY. DX: E11.65 07/02/20   Bedsole, Amy E, MD  Cholecalciferol (VITAMIN D3) 1.25 MG (50000 UT) CAPS Take 1 capsule by mouth once a week. Patient taking differently: Take 50,000 Units by mouth once a week. 12/27/20   Bedsole, Amy E, MD  DULoxetine (CYMBALTA) 30 MG capsule TAKE 2 CAPSULES BY MOUTH EVERY DAY Patient taking differently: Take 60 mg by mouth daily. 12/27/19   Bedsole, Amy E, MD  HUMALOG 100 UNIT/ML injection Inject into the skin as directed. 03/29/21   [provider]  insulin aspart (NOVOLOG) 100 UNIT/ML FlexPen Before each meal 3 times a day, 140-199 - 2 units, 200-250 - 6 units, 251-299 - 8 units,  300-349 - 10 units,  350 or above 12 units. 02/25/21   Thurnell Lose, MD  insulin glargine (LANTUS SOLOSTAR) 100 UNIT/ML Solostar Pen Inject 42 Units into the skin 2 (two) times daily. 02/25/21   Thurnell Lose, MD  JANUVIA 100 MG tablet TAKE 1 TABLET BY MOUTH EVERY DAY Patient taking differently: Take 100 mg by mouth daily. 01/07/21   Bedsole, Amy E, MD  levothyroxine (SYNTHROID) 175 MCG tablet Take 1 tablet (175 mcg total) by mouth daily. 02/25/21   Thurnell Lose, MD  nitrofurantoin, macrocrystal-monohydrate, (MACROBID) 100 MG capsule Take 1 capsule (100 mg total) by mouth 2 (two) times daily. 05/27/21   Dorothyann Peng, PA-C  trimethoprim (TRIMPEX) 100 MG tablet TAKE 1 TABLET BY MOUTH EVERY DAY Patient taking differently: Take 100 mg by mouth daily. 12/12/20   Jinny Sanders, MD    Physical Exam: Vitals:   11/15/21 2045 11/15/21 2100 11/15/21 2115 11/15/21 2300  BP:  122/81 (!) 126/91 (!) 145/71 (!) 146/64  Pulse: 87 75 84 78  Resp: 16 (!) 23 16 (!) 21  Temp:      TempSrc:      SpO2: 95% 95% 97% 97%    Physical Exam Vitals reviewed.  Constitutional:      General: She is not in acute distress.    Comments: Physical exam limited due to lack of patient cooperation.  HENT:     Head: Normocephalic and atraumatic.     Mouth/Throat:     Comments: Very dry mucous membranes Eyes:     Extraocular Movements: Extraocular movements intact.  Cardiovascular:     Rate and Rhythm: Normal rate  and regular rhythm.     Pulses: Normal pulses.  Pulmonary:     Effort: Pulmonary effort is normal. No respiratory distress.     Breath sounds: Normal breath sounds.  Abdominal:     General: Bowel sounds are normal. There is no distension.     Palpations: Abdomen is soft.     Tenderness: There is no abdominal tenderness.  Musculoskeletal:        General: No swelling or tenderness.     Cervical back: Normal range of motion. No rigidity.  Skin:    General: Skin is warm and dry.  Neurological:     Mental Status: She is alert.     Comments: Very confused, oriented to self only Agitated and combative Not following commands Moving all extremities spontaneously     Labs on Admission: I have personally reviewed following labs and imaging studies  CBC: Recent Labs  Lab 11/15/21 2000  WBC 9.3  NEUTROABS 6.4  HGB 11.7*  HCT 34.8*  MCV 89.2  PLT 270   Basic Metabolic Panel: Recent Labs  Lab 11/15/21 2000  NA 141  K 3.9  CL 107  CO2 24  GLUCOSE 103*  BUN 13  CREATININE 0.68  CALCIUM 8.6*  MG 1.3*   GFR: CrCl cannot be calculated (Unknown ideal weight.). Liver Function Tests: Recent Labs  Lab 11/15/21 2000  AST 17  ALT 10  ALKPHOS 49  BILITOT 0.6  PROT 6.0*  ALBUMIN 3.1*   No results for input(s): "LIPASE", "AMYLASE" in the last 168 hours. Recent Labs  Lab 11/15/21 2223  AMMONIA 13   Coagulation Profile: No results for input(s):  "INR", "PROTIME" in the last 168 hours. Cardiac Enzymes: No results for input(s): "CKTOTAL", "CKMB", "CKMBINDEX", "TROPONINI" in the last 168 hours. BNP (last 3 results) No results for input(s): "PROBNP" in the last 8760 hours. HbA1C: No results for input(s): "HGBA1C" in the last 72 hours. CBG: Recent Labs  Lab 11/15/21 1951  GLUCAP 106*   Lipid Profile: No results for input(s): "CHOL", "HDL", "LDLCALC", "TRIG", "CHOLHDL", "LDLDIRECT" in the last 72 hours. Thyroid Function Tests: No results for input(s): "TSH", "T4TOTAL", "FREET4", "T3FREE", "THYROIDAB" in the last 72 hours. Anemia Panel: No results for input(s): "VITAMINB12", "FOLATE", "FERRITIN", "TIBC", "IRON", "RETICCTPCT" in the last 72 hours. Urine analysis:    Component Value Date/Time   COLORURINE YELLOW 11/15/2021 2223   APPEARANCEUR CLOUDY (A) 11/15/2021 2223   LABSPEC 1.020 11/15/2021 2223   PHURINE 8.0 11/15/2021 2223   GLUCOSEU NEGATIVE 11/15/2021 2223   GLUCOSEU >=1000 (A) 03/16/2019 0915   HGBUR MODERATE (A) 11/15/2021 2223   BILIRUBINUR NEGATIVE 11/15/2021 2223   BILIRUBINUR Negative 12/25/2020 [place] 11/15/2021 2223   PROTEINUR 30 (A) 11/15/2021 2223   UROBILINOGEN 0.2 12/25/2020 1243   UROBILINOGEN 0.2 03/16/2019 0915   NITRITE POSITIVE (A) 11/15/2021 2223   LEUKOCYTESUR MODERATE (A) 11/15/2021 2223    Radiological Exams on Admission: CT Head Wo Contrast  Result Date: 11/15/2021 CLINICAL DATA:  Mental status change, unknown cause. EXAM: CT HEAD WITHOUT CONTRAST TECHNIQUE: Contiguous axial images were obtained from the base of the skull through the vertex without intravenous contrast. RADIATION DOSE REDUCTION: This exam was performed according to the departmental dose-optimization program which includes automated exposure control, adjustment of the mA and/or kV according to patient size and/or use of iterative reconstruction technique. COMPARISON:  CT head dated May 27, 2021 FINDINGS:  Brain: No evidence of acute infarction, hemorrhage, hydrocephalus, extra-axial collection or mass lesion/mass  effect. Moderate cerebral atrophy and advanced chronic microvascular ischemic changes of the white matter. Vascular: No hyperdense vessel or unexpected calcification. Skull: Normal. Negative for fracture or focal lesion. Sinuses/Orbits: No acute finding. Other: None. IMPRESSION: 1. No acute intracranial pathology. 2. Cerebral atrophy and chronic microvascular ischemic changes. Electronically Signed   By: Keane Police D.O.   On: 11/15/2021 23:16   DG CHEST PORT 1 VIEW  Result Date: 11/15/2021 CLINICAL DATA:  Altered mental status EXAM: PORTABLE CHEST 1 VIEW COMPARISON:  05/27/2021 FINDINGS: The heart size and mediastinal contours are within normal limits. Aortic atherosclerosis. Both lungs are clear. The visualized skeletal structures are unremarkable. IMPRESSION: No active disease. Electronically Signed   By: Donavan Foil M.D.   On: 11/15/2021 21:10    EKG: Independently reviewed.  Sinus rhythm, QTc 457.  No significant change since prior tracing.  Assessment and Plan  UTI UA strongly suggestive of infection.  No fever, leukocytosis, or signs of sepsis. -Continue ceftriaxone -Add on urine culture  Acute metabolic encephalopathy Likely due to UTI.  CT head negative for acute finding.  No obvious focal neurodeficit on exam. -Continue management of UTI -Check TSH -Consider brain MRI if no improvement  Hypomagnesemia -Magnesium replacement, monitor closely -Cardiac monitoring  Paroxysmal A-fib  Stable, currently in sinus rhythm. -Continue Eliquis after pharmacy med rec is done.  Unknown whether she is on a rate control agent.  Dehydration She has very dry mucous membranes. -Continue gentle IV fluid hydration  Poorly controlled insulin-dependent type 2 diabetes A1c 14.4 in November 2022.  Glucose currently 103. -Repeat A1c -Sensitive sliding scale insulin ACHS -Continue home  long-acting insulin after pharmacy med rec is done. -Hold oral hypoglycemic agents at this time.  Hypertension: Currently stable. Hypothyroidism: Check TSH. Depression -Continue home medications after pharmacy med rec is done.  DVT prophylaxis: Continue Eliquis after pharmacy med rec is done. Code Status: Full Code.  Discussed with the patient's son Jaclyn [name]. Family communication: Son updated Level of care: Telemetry bed Admission status: It is my clinical opinion that admission to INPATIENT is reasonable and necessary because of the expectation that this patient will require hospital care that crosses at least 2 midnights to treat this condition based on the medical complexity of the problems presented.  Given the aforementioned information, the predictability of an adverse outcome is felt to be significant.   Shela Leff MD [facility]  If 7PM-7AM, please contact night-coverage [email or web address]  11/15/2021, 11:23 PM

## 2021-11-16 DIAGNOSIS — E038 Other specified hypothyroidism: Secondary | ICD-10-CM

## 2021-11-16 DIAGNOSIS — N390 Urinary tract infection, site not specified: Secondary | ICD-10-CM | POA: Diagnosis not present

## 2021-11-16 DIAGNOSIS — I10 Essential (primary) hypertension: Secondary | ICD-10-CM

## 2021-11-16 DIAGNOSIS — G9341 Metabolic encephalopathy: Secondary | ICD-10-CM | POA: Diagnosis not present

## 2021-11-16 LAB — CBC
HCT: 36.1 % (ref 36.0–46.0)
Hemoglobin: 12 g/dL (ref 12.0–15.0)
MCH: 29.6 pg (ref 26.0–34.0)
MCHC: 33.2 g/dL (ref 30.0–36.0)
MCV: 88.9 fL (ref 80.0–100.0)
Platelets: 225 10*3/uL (ref 150–400)
RBC: 4.06 MIL/uL (ref 3.87–5.11)
RDW: 14.2 % (ref 11.5–15.5)
WBC: 8.8 10*3/uL (ref 4.0–10.5)
nRBC: 0 % (ref 0.0–0.2)

## 2021-11-16 LAB — BASIC METABOLIC PANEL
Anion gap: 11 (ref 5–15)
BUN: 9 mg/dL (ref 8–23)
CO2: 25 mmol/L (ref 22–32)
Calcium: 9 mg/dL (ref 8.9–10.3)
Chloride: 102 mmol/L (ref 98–111)
Creatinine, Ser: 0.74 mg/dL (ref 0.44–1.00)
GFR, Estimated: >60 mL/min (ref >60)
Glucose, Bld: 162 mg/dL — ABNORMAL HIGH (ref 70–99)
Potassium: 4.2 mmol/L (ref 3.5–5.1)
Sodium: 138 mmol/L (ref 135–145)

## 2021-11-16 LAB — GLUCOSE, CAPILLARY
Glucose-Capillary: 162 mg/dL — ABNORMAL HIGH (ref 70–99)
Glucose-Capillary: 331 mg/dL — ABNORMAL HIGH (ref 70–99)

## 2021-11-16 LAB — TSH: TSH: 19.986 u[IU]/mL — ABNORMAL HIGH (ref 0.350–4.500)

## 2021-11-16 LAB — CBG MONITORING, ED
Glucose-Capillary: 142 mg/dL — ABNORMAL HIGH (ref 70–99)
Glucose-Capillary: 154 mg/dL — ABNORMAL HIGH (ref 70–99)
Glucose-Capillary: 247 mg/dL — ABNORMAL HIGH (ref 70–99)

## 2021-11-16 LAB — HEMOGLOBIN A1C
Hgb A1c MFr Bld: 7.5 % — ABNORMAL HIGH (ref 4.8–5.6)
Mean Plasma Glucose: 168.55 mg/dL

## 2021-11-16 LAB — MAGNESIUM: Magnesium: 1.8 mg/dL (ref 1.7–2.4)

## 2021-11-16 MED ORDER — SODIUM CHLORIDE 0.9 % IV SOLN: INTRAVENOUS | Status: DC

## 2021-11-16 MED ORDER — DULOXETINE HCL 30 MG PO CPEP
30.0000 mg | ORAL_CAPSULE | Freq: Every day | ORAL | Status: DC
  Administered 2021-11-16 – 2021-11-18 (×3): 30 mg via ORAL
  Filled 2021-11-16 (×3): qty 1

## 2021-11-16 MED ORDER — APIXABAN 2.5 MG PO TABS
2.5000 mg | ORAL_TABLET | Freq: Two times a day (BID) | ORAL | Status: DC
  Administered 2021-11-16 – 2021-11-18 (×5): 2.5 mg via ORAL
  Filled 2021-11-16 (×5): qty 1

## 2021-11-16 MED ORDER — LEVOTHYROXINE SODIUM 100 MCG PO TABS
200.0000 ug | ORAL_TABLET | Freq: Every day | ORAL | Status: DC
  Administered 2021-11-16 – 2021-11-18 (×3): 200 ug via ORAL
  Filled 2021-11-16 (×3): qty 2

## 2021-11-16 MED ORDER — SODIUM CHLORIDE 0.9 % IV SOLN: INTRAVENOUS | Status: AC

## 2021-11-16 NOTE — Progress Notes (Signed)
PROGRESS NOTE    Jaclyn [name]  DXI:[id number] DOB: 02-09-1936 DOA: 11/15/2021 PCP: Jinny Sanders, MD   Brief Narrative:  Jaclyn [name] is a 86 y.o. female with medical history significant of dementia, paroxysmal A-fib on Eliquis, fibromyalgia, hypertension, poorly controlled type 2 diabetes, hypothyroidism, TIA presents to the ED via EMS from her nursing home for evaluation of decline in mental status for the past 2 days, becoming more combative/violent alternating with somnolence.  Vital signs stable on arrival to the ED.  Labs showing no leukocytosis, hemoglobin 11.7 (at baseline), glucose 103, creatinine 0.6, LFTs normal, magnesium 1.3, UA with signs of infection (positive nitrite, moderate leukocytes, and microscopy showing >50 WBCs and many bacteria), ammonia normal.  Chest x-ray showing no active disease.  CT head negative for acute finding. Patient was given Versed, ceftriaxone, 1 L LR bolus, and IV magnesium 2 g.  Assessment & Plan:   Acute metabolic encephalopathy - likely in the setting of UTI.  UA strongly suggestive of infection.  She is afebrile with no leukocytosis.  No sepsis on admission.  Urine culture is pending.  Started on Rocephin will continue same. -ET head negative for any acute findings -Clinically she is improving. -PT evaluation  Hypomagnesemia: Replenished.  Hypothyroidism: TSH is elevated at 19.  Not sure about compliance with levothyroxine.  I will resume current dose of levothyroxine and would recommend to follow-up with PCP outpatient and repeat TSH in 6 weeks  Paroxysmal A-fib: Currently in sinus rhythm -Continue Eliquis.  She is not on rate control meds at home?  Uncontrolled type 2 diabetes with long term current use of insulin: -Last A1c 14.4 checked in November 2022.  Repeat A1c is pending.  Continue sliding scale insulin.  Continue to monitor blood sugar closely.  Hold home meds at this time.  Hypertension: Currently stable.  Continue to  monitor  Depression: Continue Cymbalta  DVT prophylaxis: Eliquis Code Status: Full code Family Communication:  None present at bedside.  Plan of care discussed with patient in length and she verbalized understanding and agreed with it. Disposition Plan: To be determined  Consultants:  None  Procedures:  None  Antimicrobials:  Rocephin  Status is: Inpatient   Subjective: Patient seen and examined.  Sitting comfortably on the bed and eating breakfast.  Alert and oriented x2.  She denies any complaints.  Objective: Vitals:   11/16/21 0900 11/16/21 1000 11/16/21 1100 11/16/21 1137  BP: 103/88 (!) 141/51 117/60   Pulse: 79 73 73   Resp: '13 17 19   '$ Temp:    98.1 F (36.7 C)  TempSrc:    Oral  SpO2: 97% 98% 98%     Intake/Output Summary (Last 24 hours) at 11/16/2021 1147 Last data filed at 11/16/2021 0045 Gross per 24 hour  Intake 1150 ml  Output --  Net 1150 ml   There were no vitals filed for this visit.  Examination:  General exam: Appears calm and comfortable, on room air, eating breakfast Respiratory system: Clear to auscultation. Respiratory effort normal. Cardiovascular system: S1 & S2 heard, RRR. No JVD, murmurs, rubs, gallops or clicks. No pedal edema. Gastrointestinal system: Abdomen is nondistended, soft and nontender. No organomegaly or masses felt. Normal bowel sounds heard. Central nervous system: Alert and oriented x2.  Extremities: Symmetric 5 x 5 power. Skin: No rashes, lesions or ulcers    Data Reviewed: I have personally reviewed following labs and imaging studies  CBC: Recent Labs  Lab 11/15/21 2000 11/16/21 0135  WBC 9.3  8.8  NEUTROABS 6.4  --   HGB 11.7* 12.0  HCT 34.8* 36.1  MCV 89.2 88.9  PLT 237 638   Basic Metabolic Panel: Recent Labs  Lab 11/15/21 2000 11/16/21 0135  NA 141 138  K 3.9 4.2  CL 107 102  CO2 24 25  GLUCOSE 103* 162*  BUN 13 9  CREATININE 0.68 0.74  CALCIUM 8.6* 9.0  MG 1.3* 1.8   GFR: CrCl cannot be  calculated (Unknown ideal weight.). Liver Function Tests: Recent Labs  Lab 11/15/21 2000  AST 17  ALT 10  ALKPHOS 49  BILITOT 0.6  PROT 6.0*  ALBUMIN 3.1*   No results for input(s): "LIPASE", "AMYLASE" in the last 168 hours. Recent Labs  Lab 11/15/21 2223  AMMONIA 13   Coagulation Profile: No results for input(s): "INR", "PROTIME" in the last 168 hours. Cardiac Enzymes: No results for input(s): "CKTOTAL", "CKMB", "CKMBINDEX", "TROPONINI" in the last 168 hours. BNP (last 3 results) No results for input(s): "PROBNP" in the last 8760 hours. HbA1C: No results for input(s): "HGBA1C" in the last 72 hours. CBG: Recent Labs  Lab 11/15/21 1951 11/16/21 0034 11/16/21 0828 11/16/21 1127  GLUCAP 106* 142* 154* 247*   Lipid Profile: No results for input(s): "CHOL", "HDL", "LDLCALC", "TRIG", "CHOLHDL", "LDLDIRECT" in the last 72 hours. Thyroid Function Tests: Recent Labs    11/16/21 0135  TSH 19.986*   Anemia Panel: No results for input(s): "VITAMINB12", "FOLATE", "FERRITIN", "TIBC", "IRON", "RETICCTPCT" in the last 72 hours. Sepsis Labs: No results for input(s): "PROCALCITON", "LATICACIDVEN" in the last 168 hours.  No results found for this or any previous visit (from the past 240 hour(s)).    Radiology Studies: CT Head Wo Contrast  Result Date: 11/15/2021 CLINICAL DATA:  Mental status change, unknown cause. EXAM: CT HEAD WITHOUT CONTRAST TECHNIQUE: Contiguous axial images were obtained from the base of the skull through the vertex without intravenous contrast. RADIATION DOSE REDUCTION: This exam was performed according to the departmental dose-optimization program which includes automated exposure control, adjustment of the mA and/or kV according to patient size and/or use of iterative reconstruction technique. COMPARISON:  CT head dated May 27, 2021 FINDINGS: Brain: No evidence of acute infarction, hemorrhage, hydrocephalus, extra-axial collection or mass lesion/mass  effect. Moderate cerebral atrophy and advanced chronic microvascular ischemic changes of the white matter. Vascular: No hyperdense vessel or unexpected calcification. Skull: Normal. Negative for fracture or focal lesion. Sinuses/Orbits: No acute finding. Other: None. IMPRESSION: 1. No acute intracranial pathology. 2. Cerebral atrophy and chronic microvascular ischemic changes. Electronically Signed   By: Keane Police D.O.   On: 11/15/2021 23:16   DG CHEST PORT 1 VIEW  Result Date: 11/15/2021 CLINICAL DATA:  Altered mental status EXAM: PORTABLE CHEST 1 VIEW COMPARISON:  05/27/2021 FINDINGS: The heart size and mediastinal contours are within normal limits. Aortic atherosclerosis. Both lungs are clear. The visualized skeletal structures are unremarkable. IMPRESSION: No active disease. Electronically Signed   By: Donavan Foil M.D.   On: 11/15/2021 21:10    Scheduled Meds:  apixaban  2.5 mg Oral BID   insulin aspart  0-5 Units Subcutaneous QHS   insulin aspart  0-9 Units Subcutaneous TID WC   levothyroxine  200 mcg Oral QAC breakfast   Continuous Infusions:  sodium chloride 75 mL/hr at 11/16/21 0127   cefTRIAXone (ROCEPHIN)  IV Stopped (11/16/21 0045)     LOS: 1 day   Time spent: 35 minutes   Jaclyn [name] Loann Quill, MD [facility]  If 7PM-7AM,  please contact night-coverage [email or web address] 11/16/2021, 11:47 AM

## 2021-11-17 DIAGNOSIS — I480 Paroxysmal atrial fibrillation: Secondary | ICD-10-CM

## 2021-11-17 DIAGNOSIS — G9341 Metabolic encephalopathy: Secondary | ICD-10-CM | POA: Diagnosis not present

## 2021-11-17 DIAGNOSIS — N390 Urinary tract infection, site not specified: Secondary | ICD-10-CM | POA: Diagnosis not present

## 2021-11-17 LAB — GLUCOSE, CAPILLARY
Glucose-Capillary: 199 mg/dL — ABNORMAL HIGH (ref 70–99)
Glucose-Capillary: 248 mg/dL — ABNORMAL HIGH (ref 70–99)
Glucose-Capillary: 249 mg/dL — ABNORMAL HIGH (ref 70–99)
Glucose-Capillary: 353 mg/dL — ABNORMAL HIGH (ref 70–99)

## 2021-11-17 LAB — MRSA NEXT GEN BY PCR, NASAL: MRSA by PCR Next Gen: DETECTED — AB

## 2021-11-17 LAB — BASIC METABOLIC PANEL WITH GFR
Anion gap: 6 (ref 5–15)
BUN: 10 mg/dL (ref 8–23)
CO2: 25 mmol/L (ref 22–32)
Calcium: 8 mg/dL — ABNORMAL LOW (ref 8.9–10.3)
Chloride: 103 mmol/L (ref 98–111)
Creatinine, Ser: 0.77 mg/dL (ref 0.44–1.00)
GFR, Estimated: >60 mL/min
Glucose, Bld: 290 mg/dL — ABNORMAL HIGH (ref 70–99)
Potassium: 3.8 mmol/L (ref 3.5–5.1)
Sodium: 134 mmol/L — ABNORMAL LOW (ref 135–145)

## 2021-11-17 LAB — MAGNESIUM: Magnesium: 1.5 mg/dL — ABNORMAL LOW (ref 1.7–2.4)

## 2021-11-17 MED ORDER — CHLORHEXIDINE GLUCONATE CLOTH 2 % EX PADS: 6.0000 | MEDICATED_PAD | Freq: Every day | CUTANEOUS | Status: DC

## 2021-11-17 NOTE — Evaluation (Signed)
Physical Therapy Evaluation Patient Details Name: Jaclyn [name] MRN: [id number] DOB: 07/24/1935 Today's Date: 11/17/2021  History of Present Illness  86 y.o. female presents to the ED 11/15/21 via EMS from her nursing home for evaluation of decline in mental status for the past 2 days, becoming more combative/violent alternating with somnolence. +UTI;  PMH significant of dementia, paroxysmal A-fib on Eliquis, fibromyalgia, hypertension, poorly controlled type 2 diabetes, hypothyroidism, TIA  Clinical Impression   Pt admitted secondary to problem above with deficits below. PTA patient was ?long-term resident at SNF (pt unable to state; son not answering phone). Per 02/2021 medical records, she was essentially bedbound and SNF was recommended.  Pt currently requires min assist for bed mobility and standing EOB. She does not appear to be bedbound and will complete trial of PT 1) until prior status is known/confirmed, or 2) pt displays ability to make functional progress.  Patient may benefit from PT to address problems listed below. Will continue to follow acutely to maximize functional mobility independence and safety.          Recommendations for follow up therapy are one component of a multi-disciplinary discharge planning process, led by the attending physician.  Recommendations may be updated based on patient status, additional functional criteria and insurance authorization.  Follow Up Recommendations Long-term institutional care without follow-up therapy Can patient physically be transported by private vehicle: Yes    Assistance Recommended at Discharge Frequent or constant Supervision/Assistance  Patient can return home with the following  A lot of help with walking and/or transfers;A lot of help with bathing/dressing/bathroom;Assistance with cooking/housework;Direct supervision/assist for medications management;Direct supervision/assist for financial management;Assist for  transportation;Help with stairs or ramp for entrance    Equipment Recommendations None recommended by PT  Recommendations for Other Services       Functional Status Assessment  (unknown)     Precautions / Restrictions Precautions Precautions: Fall Precaution Comments: reports she gets really dizzy when standing and has for some time now Restrictions Weight Bearing Restrictions: No      Mobility  Bed Mobility Overal bed mobility: Needs Assistance Bed Mobility: Supine to Sit, Sit to Supine     Supine to sit: Min guard Sit to supine: Min guard   General bed mobility comments: using HOB elevated and rail    Transfers Overall transfer level: Needs assistance Equipment used: 1 person hand held assist Transfers: Sit to/from Stand Sit to Stand: Min assist           General transfer comment: pt anxious re: standing stating that she always gets dizzy; mild dizziness reported after standing ~30 seconds    Ambulation/Gait               General Gait Details: unable; states she does not stand or walk much anymore  Stairs            Wheelchair Mobility    Modified Rankin (Stroke Patients Only)       Balance Overall balance assessment: Needs assistance Sitting-balance support: No upper extremity supported, Feet unsupported Sitting balance-Leahy Scale: Fair     Standing balance support: Single extremity supported, During functional activity Standing balance-Leahy Scale: Poor                               Pertinent Vitals/Pain Pain Assessment Pain Assessment: No/denies pain    Home Living Family/patient expects to be discharged to:: Skilled nursing facility  Additional Comments: attempted to contact son to verify if pt is a long-term care resident or there for therapy; no answer    Prior Function Prior Level of Function : Patient poor historian/Family not available             Mobility Comments: per  records 02/2021, she had become bedbound over the course of several months       Hand Dominance   Dominant Hand: Right    Extremity/Trunk Assessment   Upper Extremity Assessment Upper Extremity Assessment: Generalized weakness    Lower Extremity Assessment Lower Extremity Assessment: Generalized weakness (Left DF WNL; Right DF limited to neutral)    Cervical / Trunk Assessment Cervical / Trunk Assessment: Normal  Communication   Communication: HOH  Cognition Arousal/Alertness: Awake/alert Behavior During Therapy: WFL for tasks assessed/performed Overall Cognitive Status: No family/caregiver present to determine baseline cognitive functioning                                 General Comments: h/o dementia; frequently switches subjects, can follow commands        General Comments      Exercises     Assessment/Plan    PT Assessment Patient needs continued PT services  PT Problem List Decreased strength;Decreased activity tolerance;Decreased range of motion;Decreased balance;Decreased mobility;Decreased cognition;Decreased knowledge of use of DME;Decreased safety awareness;Decreased knowledge of precautions;Cardiopulmonary status limiting activity       PT Treatment Interventions DME instruction;Gait training;Functional mobility training;Therapeutic activities;Therapeutic exercise;Balance training;Cognitive remediation;Patient/family education    PT Goals (Current goals can be found in the Care Plan section)  Acute Rehab PT Goals Patient Stated Goal: unable to state PT Goal Formulation: Patient unable to participate in goal setting Time For Goal Achievement: 12/01/21 Potential to Achieve Goals: Fair    Frequency Min 2X/week     Co-evaluation               AM-PAC PT "6 Clicks" Mobility  Outcome Measure Help needed turning from your back to your side while in a flat bed without using bedrails?: A Little Help needed moving from lying on your  back to sitting on the side of a flat bed without using bedrails?: A Little Help needed moving to and from a bed to a chair (including a wheelchair)?: A Lot Help needed standing up from a chair using your arms (e.g., wheelchair or bedside chair)?: A Little Help needed to walk in hospital room?: Total Help needed climbing 3-5 steps with a railing? : Total 6 Click Score: 13    End of Session Equipment Utilized During Treatment: Gait belt Activity Tolerance: Treatment limited secondary to medical complications (Comment) (limited by dizziness) Patient left: in bed;with call bell/phone within reach;with bed alarm set Nurse Communication: Mobility status;Other (comment) (dizziness with standing; son not answering for additional information re: prior status) PT Visit Diagnosis: Unsteadiness on feet (R26.81)    Time: 5277-8242 PT Time Calculation (min) (ACUTE ONLY): 18 min   Charges:   PT Evaluation $PT Eval Moderate Complexity: [place], PT Acute Rehabilitation Services  Office [phone]   Rexanne Mano 11/17/2021, 1:23 PM

## 2021-11-17 NOTE — Progress Notes (Signed)
PROGRESS NOTE    Jaclyn [name]  NUU:[id number] DOB: 1935-03-04 DOA: 11/15/2021 PCP: Jinny Sanders, MD   Brief Narrative:  Jaclyn [name] is a 86 y.o. female with medical history significant of dementia, paroxysmal A-fib on Eliquis, fibromyalgia, hypertension, poorly controlled type 2 diabetes, hypothyroidism, TIA presents to the ED via EMS from her nursing home for evaluation of decline in mental status for the past 2 days, becoming more combative/violent alternating with somnolence.  Vital signs stable on arrival to the ED.  Labs showing no leukocytosis, hemoglobin 11.7 (at baseline), glucose 103, creatinine 0.6, LFTs normal, magnesium 1.3, UA with signs of infection (positive nitrite, moderate leukocytes, and microscopy showing >50 WBCs and many bacteria), ammonia normal.  Chest x-ray showing no active disease.  CT head negative for acute finding. Patient was given Versed, ceftriaxone, 1 L LR bolus, and IV magnesium 2 g.  Assessment & Plan:   Acute metabolic encephalopathy - likely in the setting of UTI.  UA strongly suggestive of infection.  She is afebrile with no leukocytosis.  No sepsis on admission.  Urine culture shows gram-negative rods.   CT head negative for any acute findings. -Continue Rocephin for now -Clinically she is improving. -PT evaluation-pending  Hypomagnesemia: Replenished.  Hypothyroidism: TSH is elevated at 19.  Not sure about compliance with levothyroxine.  I will resume current dose of levothyroxine and would recommend to follow-up with PCP outpatient and repeat TSH in 6 weeks  Paroxysmal A-fib: Currently in sinus rhythm -Continue Eliquis.  She is not on rate control meds at home?  Type 2 diabetes with hyperglycemia with long term current use of insulin: -A1c 7.5%.  Continue sliding scale insulin.  Continue to monitor blood sugar closely.  Hold home meds at this time.  Hypertension: Currently stable.  Continue to monitor  Depression: Continue  Cymbalta  DVT prophylaxis: Eliquis Code Status: Full code Family Communication:  None present at bedside.  Plan of care discussed with patient in length and she verbalized understanding and agreed with it. Disposition Plan: To be determined  Consultants:  None  Procedures:  None  Antimicrobials:  Rocephin  Status is: Inpatient   Subjective: Patient seen and examined.  More alert today.  Eating breakfast.  Denies any complaints.  No acute events overnight.  Objective: Vitals:   11/16/21 1752 11/16/21 2048 11/17/21 0534 11/17/21 0958  BP: (!) 142/51 (!) 151/69 (!) 148/63 138/66  Pulse: 67 72 76 80  Resp: '18 18 17 18  '$ Temp: 98.2 F (36.8 C) 98.3 F (36.8 C) 98 F (36.7 C) 98.2 F (36.8 C)  TempSrc:    Oral  SpO2: 97% 99% 100% 98%  Weight: 63 kg       Intake/Output Summary (Last 24 hours) at 11/17/2021 1135 Last data filed at 11/17/2021 0600 Gross per 24 hour  Intake 2548.41 ml  Output 1350 ml  Net 1198.41 ml    Filed Weights   11/16/21 1752  Weight: 63 kg    Examination:  General exam: Appears calm and comfortable, on room air, hard of hearing Respiratory system: Clear to auscultation. Respiratory effort normal. Cardiovascular system: S1 & S2 heard, RRR. No JVD, murmurs, rubs, gallops or clicks. No pedal edema. Gastrointestinal system: Abdomen is nondistended, soft and nontender. No organomegaly or masses felt. Normal bowel sounds heard. Central nervous system: Alert and oriented x2.  Following commands Extremities: Symmetric 5 x 5 power. Skin: No rashes, lesions or ulcers    Data Reviewed: I have personally reviewed following labs and  imaging studies  CBC: Recent Labs  Lab 11/15/21 2000 11/16/21 0135  WBC 9.3 8.8  NEUTROABS 6.4  --   HGB 11.7* 12.0  HCT 34.8* 36.1  MCV 89.2 88.9  PLT 237 440    Basic Metabolic Panel: Recent Labs  Lab 11/15/21 2000 11/16/21 0135  NA 141 138  K 3.9 4.2  CL 107 102  CO2 24 25  GLUCOSE 103* 162*  BUN 13  9  CREATININE 0.68 0.74  CALCIUM 8.6* 9.0  MG 1.3* 1.8    GFR: Estimated Creatinine Clearance: 49 mL/min (by C-G formula based on SCr of 0.74 mg/dL). Liver Function Tests: Recent Labs  Lab 11/15/21 2000  AST 17  ALT 10  ALKPHOS 49  BILITOT 0.6  PROT 6.0*  ALBUMIN 3.1*    No results for input(s): "LIPASE", "AMYLASE" in the last 168 hours. Recent Labs  Lab 11/15/21 2223  AMMONIA 13    Coagulation Profile: No results for input(s): "INR", "PROTIME" in the last 168 hours. Cardiac Enzymes: No results for input(s): "CKTOTAL", "CKMB", "CKMBINDEX", "TROPONINI" in the last 168 hours. BNP (last 3 results) No results for input(s): "PROBNP" in the last 8760 hours. HbA1C: Recent Labs    11/16/21 0135  HGBA1C 7.5*   CBG: Recent Labs  Lab 11/16/21 0828 11/16/21 1127 11/16/21 1752 11/16/21 2047 11/17/21 0747  GLUCAP 154* 247* 162* 331* 199*    Lipid Profile: No results for input(s): "CHOL", "HDL", "LDLCALC", "TRIG", "CHOLHDL", "LDLDIRECT" in the last 72 hours. Thyroid Function Tests: Recent Labs    11/16/21 0135  TSH 19.986*    Anemia Panel: No results for input(s): "VITAMINB12", "FOLATE", "FERRITIN", "TIBC", "IRON", "RETICCTPCT" in the last 72 hours. Sepsis Labs: No results for input(s): "PROCALCITON", "LATICACIDVEN" in the last 168 hours.  Recent Results (from the past 240 hour(s))  Urine Culture     Status: Abnormal (Preliminary result)   Collection Time: 11/15/21 10:23 PM   Specimen: Urine, Clean Catch  Result Value Ref Range Status   Specimen Description URINE, CLEAN CATCH  Final   Special Requests NONE  Final   Culture (A)  Final    >=100,000 COLONIES/mL GRAM NEGATIVE RODS CULTURE REINCUBATED FOR BETTER GROWTH Performed at [facility], [address] [place], [place] [address]    Report Status PENDING  Incomplete  MRSA Next Gen by PCR, Nasal     Status: Abnormal   Collection Time: 11/17/21  6:22 AM   Specimen: Nasal Mucosa; Nasal Swab   Result Value Ref Range Status   MRSA by PCR Next Gen DETECTED (A) NOT DETECTED Final    Comment: RESULT CALLED TO, READ BACK BY AND VERIFIED WITH: 11/17/2021 @ 0836 RN Toula Moos, ADC (NOTE) The GeneXpert MRSA Assay (FDA approved for NASAL specimens only), is one component of a comprehensive MRSA colonization surveillance program. It is not intended to diagnose MRSA infection nor to guide or monitor treatment for MRSA infections. Test performance is not FDA approved in patients less than 33 years old. Performed at [facility], [place] [address], [place], [place] [address]       Radiology Studies: CT Head Wo Contrast  Result Date: 11/15/2021 CLINICAL DATA:  Mental status change, unknown cause. EXAM: CT HEAD WITHOUT CONTRAST TECHNIQUE: Contiguous axial images were obtained from the base of the skull through the vertex without intravenous contrast. RADIATION DOSE REDUCTION: This exam was performed according to the departmental dose-optimization program which includes automated exposure control, adjustment of the mA and/or kV according to  patient size and/or use of iterative reconstruction technique. COMPARISON:  CT head dated May 27, 2021 FINDINGS: Brain: No evidence of acute infarction, hemorrhage, hydrocephalus, extra-axial collection or mass lesion/mass effect. Moderate cerebral atrophy and advanced chronic microvascular ischemic changes of the white matter. Vascular: No hyperdense vessel or unexpected calcification. Skull: Normal. Negative for fracture or focal lesion. Sinuses/Orbits: No acute finding. Other: None. IMPRESSION: 1. No acute intracranial pathology. 2. Cerebral atrophy and chronic microvascular ischemic changes. Electronically Signed   By: Keane Police D.O.   On: 11/15/2021 23:16   DG CHEST PORT 1 VIEW  Result Date: 11/15/2021 CLINICAL DATA:  Altered mental status EXAM: PORTABLE CHEST 1 VIEW COMPARISON:  05/27/2021 FINDINGS: The heart size and mediastinal contours are within  normal limits. Aortic atherosclerosis. Both lungs are clear. The visualized skeletal structures are unremarkable. IMPRESSION: No active disease. Electronically Signed   By: Donavan Foil M.D.   On: 11/15/2021 21:10    Scheduled Meds:  apixaban  2.5 mg Oral BID   Chlorhexidine Gluconate Cloth  6 each Topical Q0600   DULoxetine  30 mg Oral Daily   insulin aspart  0-5 Units Subcutaneous QHS   insulin aspart  0-9 Units Subcutaneous TID WC   levothyroxine  200 mcg Oral QAC breakfast   Continuous Infusions:  sodium chloride 75 mL/hr at 11/17/21 0433   cefTRIAXone (ROCEPHIN)  IV 1 g (11/16/21 2333)     LOS: 2 days   Time spent: 35 minutes   Belma Dyches Loann Quill, MD [facility]  If 7PM-7AM, please contact night-coverage [email or web address] 11/17/2021, 11:35 AM

## 2021-11-18 DIAGNOSIS — M2550 Pain in unspecified joint: Secondary | ICD-10-CM | POA: Diagnosis not present

## 2021-11-18 DIAGNOSIS — N390 Urinary tract infection, site not specified: Secondary | ICD-10-CM | POA: Diagnosis not present

## 2021-11-18 DIAGNOSIS — Z7401 Bed confinement status: Secondary | ICD-10-CM | POA: Diagnosis not present

## 2021-11-18 LAB — BASIC METABOLIC PANEL
Anion gap: 8 (ref 5–15)
BUN: 11 mg/dL (ref 8–23)
CO2: 25 mmol/L (ref 22–32)
Calcium: 8.6 mg/dL — ABNORMAL LOW (ref 8.9–10.3)
Chloride: 103 mmol/L (ref 98–111)
Creatinine, Ser: 0.85 mg/dL (ref 0.44–1.00)
GFR, Estimated: >60 mL/min (ref >60)
Glucose, Bld: 335 mg/dL — ABNORMAL HIGH (ref 70–99)
Potassium: 3.8 mmol/L (ref 3.5–5.1)
Sodium: 136 mmol/L (ref 135–145)

## 2021-11-18 LAB — MAGNESIUM: Magnesium: 1.4 mg/dL — ABNORMAL LOW (ref 1.7–2.4)

## 2021-11-18 LAB — GLUCOSE, CAPILLARY
Glucose-Capillary: 267 mg/dL — ABNORMAL HIGH (ref 70–99)
Glucose-Capillary: 299 mg/dL — ABNORMAL HIGH (ref 70–99)
Glucose-Capillary: 260 mg/dL — ABNORMAL HIGH (ref 70–99)

## 2021-11-18 MED ORDER — MAGNESIUM SULFATE 2 GM/50ML IV SOLN
2.0000 g | Freq: Once | INTRAVENOUS | Status: AC
  Administered 2021-11-18: 2 g via INTRAVENOUS
  Filled 2021-11-18: qty 50

## 2021-11-18 MED ORDER — INSULIN GLARGINE-YFGN 100 UNIT/ML ~~LOC~~ SOLN
20.0000 [IU] | Freq: Two times a day (BID) | SUBCUTANEOUS | Status: DC
  Administered 2021-11-18: 20 [IU] via SUBCUTANEOUS
  Filled 2021-11-18 (×3): qty 0.2

## 2021-11-18 MED ORDER — CEFPODOXIME PROXETIL 200 MG PO TABS: 200.0000 mg | ORAL_TABLET | Freq: Two times a day (BID) | ORAL | 0 refills | Status: DC

## 2021-11-18 MED ORDER — MUPIROCIN 2 % EX OINT
1.0000 | TOPICAL_OINTMENT | Freq: Two times a day (BID) | CUTANEOUS | Status: DC
  Administered 2021-11-18: 1 via NASAL
  Filled 2021-11-18: qty 22

## 2021-11-18 NOTE — TOC Progression Note (Signed)
Transition of Care [facility]) - Initial/Assessment Note    Patient Details  Name: Jaclyn [name] MRN: [id number] Date of Birth: [birth year]  Transition of Care [facility]) CM/SW Contact:    Milinda Antis, [place] Phone Number: 11/18/2021, 12:12 PM  Clinical Narrative:                 CSW contacted [facility] to inquire about the patient returning today and is awaiting a response.          Patient Goals and CMS Choice        Expected Discharge Plan and Services           Expected Discharge Date: 11/18/21                                    Prior Living Arrangements/Services                       Activities of Daily Living      Permission Sought/Granted                  Emotional Assessment              Admission diagnosis:  UTI (urinary tract infection) [N39.0] Acute cystitis with hematuria [N30.01] Patient Active Problem List   Diagnosis Date Noted   Hypomagnesemia 11/15/2021   Depression 11/15/2021   Incontinence 02/17/2021   Ataxia 02/17/2021   UTI (urinary tract infection) 02/16/2021   Failure to thrive in adult 02/16/2021   Mixed stress and urge urinary incontinence 02/05/2021   Yeast infection 12/03/2020   Hypokalemia    Labile blood glucose    Uncontrolled type 2 diabetes mellitus with hyperglycemia (HCC)    Orthostatic hypotension    Elevated blood pressure reading    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   Recurrent UTI    Dyslipidemia    Orthostasis    Dysarthria    Vagina, candidiasis 03/10/2019   Dementia ([place]) 03/10/2019   Acute respiratory failure with hypoxia ([place]) 00/86/7619   Acute metabolic encephalopathy 50/93/2671   COVID-19 virus infection 01/15/2019   Acute lower UTI 01/15/2019   B12 deficiency 01/22/2018   History of CVA (cerebrovascular accident) 06/20/2017   Stenosis of right carotid artery    Peripheral edema 07/29/2016   Imbalance 04/07/2016   Intertriginous candidiasis 04/26/2015    Hard of hearing 04/12/2015   TIA (transient ischemic attack) 04/10/2015   History of recurrent UTIs 04/10/2015   Dysuria 03/09/2015   Tremor 05/11/2014   Leg weakness, bilateral 05/11/2014   Fatty liver 08/04/2013   Personal history of colonic polyps 10/05/2012   Esophageal reflux 10/05/2012   Carotid stenosis, symptomatic, with infarction ([place]) 10/14/2010   MDD (major depressive disorder), single episode, moderate ([place]) 01/13/2008   Essential hypertension, benign 01/13/2008   PAROXYSMAL ATRIAL FIBRILLATION 01/13/2008   Intracranial vascular stenosis 01/13/2008   DIVERTICULOSIS OF COLON 01/13/2008   Fibromyalgia 01/13/2008   CHEST PAIN, ATYPICAL 01/13/2008   Hypothyroidism 04/13/2007   Type 2 diabetes mellitus ([place]) 04/13/2007   Vitamin D deficiency 04/13/2007   Hyperlipidemia LDL goal <70 04/13/2007   DEGENERATIVE JOINT DISEASE 04/13/2007   PCP:  Jinny Sanders, MD Pharmacy:   [place], [place] - [address] [place] [address] Phone: [phone] Fax: [phone]     Social Determinants of Health (SDOH) Interventions  Readmission Risk Interventions     No data to display

## 2021-11-18 NOTE — Discharge Summary (Signed)
Physician Discharge Summary   Patient: Jaclyn [name] MRN: [id number] DOB: [birth year]  Admit date:     11/15/2021  Discharge date: 11/18/21  Discharge Physician: Edwin Dada   PCP: Jinny Sanders, MD     Recommendations at discharge:  Follow up with PCP Dr. Diona Browner in 1 week     Discharge Diagnoses: Principal Problem:   UTI (urinary tract infection) Active Problems:   Hypothyroidism   Type 2 diabetes mellitus ([place])   Essential hypertension, benign   PAROXYSMAL ATRIAL FIBRILLATION   Acute metabolic encephalopathy   Hypomagnesemia   Depression      Hospital Course: Jaclyn [name] is a 86 y.o. F with dementia, lives in long-term care, pAF on Eliquis, fibromyalgia, DM, HTN and TIA who presented with decreased responsiveness for 2 days.    In the ER, renal function normal.  She had pyuria on urinalysis.  Ammonia normal.  Chest x-ray showing no active disease.  CT head negative for acute finding.      UTI (urinary tract infection) Acute metabolic encephalopathy Patient was admitted and given fluids and antibiotics.  Her mentation improved, she was not combative and was able to participate in self cares.  Taking orals.  Temp < 100 F, heart rate < 100bpm, RR < 24.    She has no prior history of drug resistant infection and so was discharged on cefpodoxime for 3 more days.             The [facility] Controlled Substances Registry was reviewed for this patient prior to discharge.     Disposition: Assisted living   DISCHARGE MEDICATION: Allergies as of 11/18/2021       Reactions   Naproxen Sodium Other (See Comments)   Fever/aches and pains   Statins Other (See Comments)   myalgias   Sulfonamide Derivatives Hives, Itching   Not documented on MAR   Latex Itching, Rash   Shellfish Allergy Itching, Swelling, Rash   Seafood, shrimp        Medication List     TAKE these medications    B-D ULTRAFINE III SHORT PEN 31G X 8 MM Misc Generic  drug: Insulin Pen Needle USE TO INJECT INSULIN ONCE DAILY. DX: E11.65   cefpodoxime 200 MG tablet Commonly known as: VANTIN Take 1 tablet (200 mg total) by mouth 2 (two) times daily.   docusate sodium 100 MG capsule Commonly known as: COLACE Take 100 mg by mouth 2 (two) times daily.   DULoxetine 30 MG capsule Commonly known as: CYMBALTA TAKE 2 CAPSULES BY MOUTH EVERY DAY What changed: how much to take   Eliquis 2.5 MG Tabs tablet Generic drug: apixaban Take 2.5 mg by mouth 2 (two) times daily.   Fish Oil 1000 MG Caps Take 1,000 mg by mouth daily.   HumaLOG 100 UNIT/ML injection Generic drug: insulin lispro Inject 2-12 Units into the skin 4 (four) times daily -  before meals and at bedtime. Per sliding scale: 140-199= 2 units, 200-250= 6 units, 251-299= 8 units, 300-349= 10 units, 350-400= 12 units,   Januvia 100 MG tablet Generic drug: sitaGLIPtin TAKE 1 TABLET BY MOUTH EVERY DAY What changed: how much to take   Lantus SoloStar 100 UNIT/ML Solostar Pen Generic drug: insulin glargine Inject 42 Units into the skin 2 (two) times daily. What changed: when to take this   levothyroxine 200 MCG tablet Commonly known as: SYNTHROID Take 200 mcg by mouth daily before breakfast.   metFORMIN 1000 MG tablet Commonly known  as: GLUCOPHAGE Take 1,000 mg by mouth 2 (two) times daily.   trimethoprim 100 MG tablet Commonly known as: TRIMPEX TAKE 1 TABLET BY MOUTH EVERY DAY   Vitamin D3 1.25 MG (50000 UT) Caps Take 1 capsule by mouth once a week. What changed:  how much to take additional instructions        Follow-up Information     Bedsole, Amy E, MD. Schedule an appointment as soon as possible for a visit in 1 week(s).   Specialty: Family Medicine Contact information: [place] [address] [phone]                 Discharge Instructions     Increase activity slowly   Complete by: As directed        Discharge Exam: Filed  Weights   11/16/21 1752  Weight: 63 kg    General: Pt is sleeping but rousable, interactive, not in acute distress Cardiovascular: RRR, nl S1-S2, no murmurs appreciated.   No LE edema.   Respiratory: Normal respiratory rate and rhythm.  CTAB without rales or wheezes. Abdominal: Abdomen soft and non-tender.  No distension or HSM.   Neuro/Psych: Strength symmetric in upper and lower extremities.  Judgment and insight appear impaired at baseline.   Condition at discharge: stable  The results of significant diagnostics from this hospitalization (including imaging, microbiology, ancillary and laboratory) are listed below for reference.   Imaging Studies: CT Head Wo Contrast  Result Date: 11/15/2021 CLINICAL DATA:  Mental status change, unknown cause. EXAM: CT HEAD WITHOUT CONTRAST TECHNIQUE: Contiguous axial images were obtained from the base of the skull through the vertex without intravenous contrast. RADIATION DOSE REDUCTION: This exam was performed according to the departmental dose-optimization program which includes automated exposure control, adjustment of the mA and/or kV according to patient size and/or use of iterative reconstruction technique. COMPARISON:  CT head dated May 27, 2021 FINDINGS: Brain: No evidence of acute infarction, hemorrhage, hydrocephalus, extra-axial collection or mass lesion/mass effect. Moderate cerebral atrophy and advanced chronic microvascular ischemic changes of the white matter. Vascular: No hyperdense vessel or unexpected calcification. Skull: Normal. Negative for fracture or focal lesion. Sinuses/Orbits: No acute finding. Other: None. IMPRESSION: 1. No acute intracranial pathology. 2. Cerebral atrophy and chronic microvascular ischemic changes. Electronically Signed   By: Keane Police D.O.   On: 11/15/2021 23:16   DG CHEST PORT 1 VIEW  Result Date: 11/15/2021 CLINICAL DATA:  Altered mental status EXAM: PORTABLE CHEST 1 VIEW COMPARISON:  05/27/2021 FINDINGS:  The heart size and mediastinal contours are within normal limits. Aortic atherosclerosis. Both lungs are clear. The visualized skeletal structures are unremarkable. IMPRESSION: No active disease. Electronically Signed   By: Donavan Foil M.D.   On: 11/15/2021 21:10    Microbiology: Results for orders placed or performed during the hospital encounter of 11/15/21  Urine Culture     Status: Abnormal (Preliminary result)   Collection Time: 11/15/21 10:23 PM   Specimen: Urine, Clean Catch  Result Value Ref Range Status   Specimen Description URINE, CLEAN CATCH  Final   Special Requests   Final    NONE Performed at [facility], [place] [address], [place], [place] [address]    Culture (A)  Final    >=100,000 COLONIES/mL GRAM NEGATIVE RODS >=100,000 COLONIES/mL PROTEUS MIRABILIS    Report Status PENDING  Incomplete  MRSA Next Gen by PCR, Nasal     Status: Abnormal   Collection Time: 11/17/21  6:22  AM   Specimen: Nasal Mucosa; Nasal Swab  Result Value Ref Range Status   MRSA by PCR Next Gen DETECTED (A) NOT DETECTED Final    Comment: RESULT CALLED TO, READ BACK BY AND VERIFIED WITH: 11/17/2021 @ 0836 RN Toula Moos, ADC (NOTE) The GeneXpert MRSA Assay (FDA approved for NASAL specimens only), is one component of a comprehensive MRSA colonization surveillance program. It is not intended to diagnose MRSA infection nor to guide or monitor treatment for MRSA infections. Test performance is not FDA approved in patients less than 58 years old. Performed at [facility], [place] [address], [place], [place] [address]     Labs: CBC: Recent Labs  Lab 11/15/21 2000 11/16/21 0135  WBC 9.3 8.8  NEUTROABS 6.4  --   HGB 11.7* 12.0  HCT 34.8* 36.1  MCV 89.2 88.9  PLT 237 009   Basic Metabolic Panel: Recent Labs  Lab 11/15/21 2000 11/16/21 0135 11/17/21 1126 11/18/21 0420  NA 141 138 134* 136  K 3.9 4.2 3.8 3.8  CL 107 102 103 103  CO2 '24 25 25 25  '$ GLUCOSE 103* 162* 290* 335*  BUN  '13 9 10 11  '$ CREATININE 0.68 0.74 0.77 0.85  CALCIUM 8.6* 9.0 8.0* 8.6*  MG 1.3* 1.8 1.5* 1.4*   Liver Function Tests: Recent Labs  Lab 11/15/21 2000  AST 17  ALT 10  ALKPHOS 49  BILITOT 0.6  PROT 6.0*  ALBUMIN 3.1*   CBG: Recent Labs  Lab 11/17/21 1151 11/17/21 1640 11/17/21 2117 11/18/21 0743 11/18/21 1129  GLUCAP 248* 249* 353* 267* 299*    Discharge time spent: approximately 25 minutes spent on discharge counseling, evaluation of patient on day of discharge, and coordination of discharge planning with nursing, social work, pharmacy and case management  Signed: Edwin Dada, MD [facility] 11/18/2021

## 2021-11-18 NOTE — TOC Transition Note (Signed)
Transition of Care [facility]) - CM/SW Discharge Note   Patient Details  Name: Jaclyn [name] MRN: [id number] Date of Birth: 1936-01-16  Transition of Care [facility]) CM/SW Contact:  Milinda Antis, [place] Phone Number: 11/18/2021, 2:55 PM   Clinical Narrative:    Patient will DC to:  [facility]  Anticipated DC date:  11/18/2021 Family notified:  Yes Transport by:  Corey Harold   Per MD patient ready for DC to SNF. RN to call report prior to discharge [phone] room 207). RN, patient's family, and facility notified of DC. Discharge Summary sent to facility. DC packet on chart. Ambulance transport will be requested for patient.   CSW will sign off for now as social work intervention is no longer needed. Please consult [name] again if new needs arise.     Final next level of care: [facility]     Patient Goals and CMS Choice        Discharge Placement              Patient chooses bed at: [facility] Patient to be transferred to facility by: [facility] Name of family member notified: Talisha, [place] (Son)   [phone] Patient and family notified of of transfer: 11/18/21  Discharge Plan and Services                                     Social Determinants of Health (SDOH) Interventions     Readmission Risk Interventions     No data to display

## 2021-11-18 NOTE — Progress Notes (Signed)
DISCHARGE NOTE HOME Jaclyn [name] to be discharged Skilled nursing facility per MD order. Discussed prescriptions and follow up appointments with the patient. Prescriptions given to patient; medication list explained in detail. Patient verbalized understanding.  Skin clean, dry and intact without evidence of skin break down, no evidence of skin tears noted. IV catheter discontinued intact. Site without signs and symptoms of complications. Dressing and pressure applied. Pt denies pain at the site currently. No complaints noted.  Patient free of lines, drains, and wounds.   An After Visit Summary (AVS) was printed and given to the patient. Patient escorted via stretcher, and discharged to [facility] via [facility].  Contacted the number listed in the [facility] note to give report prior to discharge. No one answered phone calls    Sharmon Revere, RN

## 2021-11-18 NOTE — Hospital Course (Signed)
Jaclyn [name] is a 86 y.o. female with medical history significant of dementia, paroxysmal A-fib on Eliquis, fibromyalgia, hypertension, poorly controlled type 2 diabetes, hypothyroidism, TIA presents to the ED via EMS from her nursing home for evaluation of decline in mental status for the past 2 days, becoming more combative/violent alternating with somnolence.  Vital signs stable on arrival to the ED.  Labs showing no leukocytosis, hemoglobin 11.7 (at baseline), glucose 103, creatinine 0.6, LFTs normal, magnesium 1.3, UA with signs of infection (positive nitrite, moderate leukocytes, and microscopy showing >50 WBCs and many bacteria), ammonia normal.  Chest x-ray showing no active disease.  CT head negative for acute finding. Patient was given Versed, ceftriaxone, 1 L LR bolus, and IV magnesium 2 g.

## 2021-11-19 LAB — URINE CULTURE: Culture: >=100000 — AB

## 2021-11-25 DIAGNOSIS — R1312 Dysphagia, oropharyngeal phase: Secondary | ICD-10-CM | POA: Diagnosis not present

## 2021-11-25 DIAGNOSIS — R2689 Other abnormalities of gait and mobility: Secondary | ICD-10-CM | POA: Diagnosis not present

## 2021-11-25 DIAGNOSIS — E1165 Type 2 diabetes mellitus with hyperglycemia: Secondary | ICD-10-CM | POA: Diagnosis not present

## 2021-11-25 DIAGNOSIS — I480 Paroxysmal atrial fibrillation: Secondary | ICD-10-CM | POA: Diagnosis not present

## 2021-11-25 DIAGNOSIS — R2681 Unsteadiness on feet: Secondary | ICD-10-CM | POA: Diagnosis not present

## 2021-11-26 DIAGNOSIS — R2681 Unsteadiness on feet: Secondary | ICD-10-CM | POA: Diagnosis not present

## 2021-11-26 DIAGNOSIS — I480 Paroxysmal atrial fibrillation: Secondary | ICD-10-CM | POA: Diagnosis not present

## 2021-11-26 DIAGNOSIS — E1165 Type 2 diabetes mellitus with hyperglycemia: Secondary | ICD-10-CM | POA: Diagnosis not present

## 2021-11-26 DIAGNOSIS — R2689 Other abnormalities of gait and mobility: Secondary | ICD-10-CM | POA: Diagnosis not present

## 2021-11-26 DIAGNOSIS — R1312 Dysphagia, oropharyngeal phase: Secondary | ICD-10-CM | POA: Diagnosis not present

## 2021-11-27 DIAGNOSIS — E1165 Type 2 diabetes mellitus with hyperglycemia: Secondary | ICD-10-CM | POA: Diagnosis not present

## 2021-11-27 DIAGNOSIS — R2689 Other abnormalities of gait and mobility: Secondary | ICD-10-CM | POA: Diagnosis not present

## 2021-11-27 DIAGNOSIS — I480 Paroxysmal atrial fibrillation: Secondary | ICD-10-CM | POA: Diagnosis not present

## 2021-11-27 DIAGNOSIS — R1312 Dysphagia, oropharyngeal phase: Secondary | ICD-10-CM | POA: Diagnosis not present

## 2021-11-27 DIAGNOSIS — R2681 Unsteadiness on feet: Secondary | ICD-10-CM | POA: Diagnosis not present

## 2021-11-28 DIAGNOSIS — I480 Paroxysmal atrial fibrillation: Secondary | ICD-10-CM | POA: Diagnosis not present

## 2021-11-28 DIAGNOSIS — R2689 Other abnormalities of gait and mobility: Secondary | ICD-10-CM | POA: Diagnosis not present

## 2021-11-28 DIAGNOSIS — R1312 Dysphagia, oropharyngeal phase: Secondary | ICD-10-CM | POA: Diagnosis not present

## 2021-11-28 DIAGNOSIS — R2681 Unsteadiness on feet: Secondary | ICD-10-CM | POA: Diagnosis not present

## 2021-11-28 DIAGNOSIS — E1165 Type 2 diabetes mellitus with hyperglycemia: Secondary | ICD-10-CM | POA: Diagnosis not present

## 2021-11-29 DIAGNOSIS — R2689 Other abnormalities of gait and mobility: Secondary | ICD-10-CM | POA: Diagnosis not present

## 2021-11-29 DIAGNOSIS — I480 Paroxysmal atrial fibrillation: Secondary | ICD-10-CM | POA: Diagnosis not present

## 2021-11-29 DIAGNOSIS — R1312 Dysphagia, oropharyngeal phase: Secondary | ICD-10-CM | POA: Diagnosis not present

## 2021-11-29 DIAGNOSIS — E1165 Type 2 diabetes mellitus with hyperglycemia: Secondary | ICD-10-CM | POA: Diagnosis not present

## 2021-11-29 DIAGNOSIS — R2681 Unsteadiness on feet: Secondary | ICD-10-CM | POA: Diagnosis not present

## 2021-12-02 DIAGNOSIS — E1165 Type 2 diabetes mellitus with hyperglycemia: Secondary | ICD-10-CM | POA: Diagnosis not present

## 2021-12-02 DIAGNOSIS — R1312 Dysphagia, oropharyngeal phase: Secondary | ICD-10-CM | POA: Diagnosis not present

## 2021-12-02 DIAGNOSIS — I480 Paroxysmal atrial fibrillation: Secondary | ICD-10-CM | POA: Diagnosis not present

## 2021-12-02 DIAGNOSIS — R2681 Unsteadiness on feet: Secondary | ICD-10-CM | POA: Diagnosis not present

## 2021-12-02 DIAGNOSIS — R2689 Other abnormalities of gait and mobility: Secondary | ICD-10-CM | POA: Diagnosis not present

## 2021-12-03 DIAGNOSIS — I480 Paroxysmal atrial fibrillation: Secondary | ICD-10-CM | POA: Diagnosis not present

## 2021-12-03 DIAGNOSIS — E1165 Type 2 diabetes mellitus with hyperglycemia: Secondary | ICD-10-CM | POA: Diagnosis not present

## 2021-12-03 DIAGNOSIS — R2689 Other abnormalities of gait and mobility: Secondary | ICD-10-CM | POA: Diagnosis not present

## 2021-12-03 DIAGNOSIS — R2681 Unsteadiness on feet: Secondary | ICD-10-CM | POA: Diagnosis not present

## 2021-12-03 DIAGNOSIS — R1312 Dysphagia, oropharyngeal phase: Secondary | ICD-10-CM | POA: Diagnosis not present

## 2021-12-04 DIAGNOSIS — R2689 Other abnormalities of gait and mobility: Secondary | ICD-10-CM | POA: Diagnosis not present

## 2021-12-04 DIAGNOSIS — M6281 Muscle weakness (generalized): Secondary | ICD-10-CM | POA: Diagnosis not present

## 2021-12-04 DIAGNOSIS — R2681 Unsteadiness on feet: Secondary | ICD-10-CM | POA: Diagnosis not present

## 2021-12-04 DIAGNOSIS — R1312 Dysphagia, oropharyngeal phase: Secondary | ICD-10-CM | POA: Diagnosis not present

## 2021-12-04 DIAGNOSIS — E1165 Type 2 diabetes mellitus with hyperglycemia: Secondary | ICD-10-CM | POA: Diagnosis not present

## 2021-12-04 DIAGNOSIS — I480 Paroxysmal atrial fibrillation: Secondary | ICD-10-CM | POA: Diagnosis not present

## 2021-12-05 DIAGNOSIS — E1165 Type 2 diabetes mellitus with hyperglycemia: Secondary | ICD-10-CM | POA: Diagnosis not present

## 2021-12-05 DIAGNOSIS — R2681 Unsteadiness on feet: Secondary | ICD-10-CM | POA: Diagnosis not present

## 2021-12-05 DIAGNOSIS — R1312 Dysphagia, oropharyngeal phase: Secondary | ICD-10-CM | POA: Diagnosis not present

## 2021-12-05 DIAGNOSIS — I480 Paroxysmal atrial fibrillation: Secondary | ICD-10-CM | POA: Diagnosis not present

## 2021-12-05 DIAGNOSIS — R2689 Other abnormalities of gait and mobility: Secondary | ICD-10-CM | POA: Diagnosis not present

## 2021-12-06 DIAGNOSIS — R2689 Other abnormalities of gait and mobility: Secondary | ICD-10-CM | POA: Diagnosis not present

## 2021-12-06 DIAGNOSIS — R2681 Unsteadiness on feet: Secondary | ICD-10-CM | POA: Diagnosis not present

## 2021-12-06 DIAGNOSIS — E1165 Type 2 diabetes mellitus with hyperglycemia: Secondary | ICD-10-CM | POA: Diagnosis not present

## 2021-12-06 DIAGNOSIS — R1312 Dysphagia, oropharyngeal phase: Secondary | ICD-10-CM | POA: Diagnosis not present

## 2021-12-06 DIAGNOSIS — I480 Paroxysmal atrial fibrillation: Secondary | ICD-10-CM | POA: Diagnosis not present

## 2021-12-09 DIAGNOSIS — I480 Paroxysmal atrial fibrillation: Secondary | ICD-10-CM | POA: Diagnosis not present

## 2021-12-09 DIAGNOSIS — E1165 Type 2 diabetes mellitus with hyperglycemia: Secondary | ICD-10-CM | POA: Diagnosis not present

## 2021-12-09 DIAGNOSIS — R1312 Dysphagia, oropharyngeal phase: Secondary | ICD-10-CM | POA: Diagnosis not present

## 2021-12-09 DIAGNOSIS — R2689 Other abnormalities of gait and mobility: Secondary | ICD-10-CM | POA: Diagnosis not present

## 2021-12-09 DIAGNOSIS — R2681 Unsteadiness on feet: Secondary | ICD-10-CM | POA: Diagnosis not present

## 2021-12-10 DIAGNOSIS — I480 Paroxysmal atrial fibrillation: Secondary | ICD-10-CM | POA: Diagnosis not present

## 2021-12-10 DIAGNOSIS — E1165 Type 2 diabetes mellitus with hyperglycemia: Secondary | ICD-10-CM | POA: Diagnosis not present

## 2021-12-10 DIAGNOSIS — R2681 Unsteadiness on feet: Secondary | ICD-10-CM | POA: Diagnosis not present

## 2021-12-10 DIAGNOSIS — Z23 Encounter for immunization: Secondary | ICD-10-CM | POA: Diagnosis not present

## 2021-12-10 DIAGNOSIS — R1312 Dysphagia, oropharyngeal phase: Secondary | ICD-10-CM | POA: Diagnosis not present

## 2021-12-10 DIAGNOSIS — R2689 Other abnormalities of gait and mobility: Secondary | ICD-10-CM | POA: Diagnosis not present

## 2021-12-11 DIAGNOSIS — R2689 Other abnormalities of gait and mobility: Secondary | ICD-10-CM | POA: Diagnosis not present

## 2021-12-11 DIAGNOSIS — R2681 Unsteadiness on feet: Secondary | ICD-10-CM | POA: Diagnosis not present

## 2021-12-11 DIAGNOSIS — I480 Paroxysmal atrial fibrillation: Secondary | ICD-10-CM | POA: Diagnosis not present

## 2021-12-11 DIAGNOSIS — E1165 Type 2 diabetes mellitus with hyperglycemia: Secondary | ICD-10-CM | POA: Diagnosis not present

## 2021-12-11 DIAGNOSIS — R1312 Dysphagia, oropharyngeal phase: Secondary | ICD-10-CM | POA: Diagnosis not present

## 2021-12-12 DIAGNOSIS — R1312 Dysphagia, oropharyngeal phase: Secondary | ICD-10-CM | POA: Diagnosis not present

## 2021-12-12 DIAGNOSIS — R2681 Unsteadiness on feet: Secondary | ICD-10-CM | POA: Diagnosis not present

## 2021-12-12 DIAGNOSIS — I480 Paroxysmal atrial fibrillation: Secondary | ICD-10-CM | POA: Diagnosis not present

## 2021-12-12 DIAGNOSIS — E1165 Type 2 diabetes mellitus with hyperglycemia: Secondary | ICD-10-CM | POA: Diagnosis not present

## 2021-12-12 DIAGNOSIS — R2689 Other abnormalities of gait and mobility: Secondary | ICD-10-CM | POA: Diagnosis not present

## 2021-12-13 DIAGNOSIS — I480 Paroxysmal atrial fibrillation: Secondary | ICD-10-CM | POA: Diagnosis not present

## 2021-12-13 DIAGNOSIS — E1165 Type 2 diabetes mellitus with hyperglycemia: Secondary | ICD-10-CM | POA: Diagnosis not present

## 2021-12-13 DIAGNOSIS — R1312 Dysphagia, oropharyngeal phase: Secondary | ICD-10-CM | POA: Diagnosis not present

## 2021-12-13 DIAGNOSIS — R2689 Other abnormalities of gait and mobility: Secondary | ICD-10-CM | POA: Diagnosis not present

## 2021-12-13 DIAGNOSIS — R2681 Unsteadiness on feet: Secondary | ICD-10-CM | POA: Diagnosis not present

## 2021-12-16 DIAGNOSIS — E1165 Type 2 diabetes mellitus with hyperglycemia: Secondary | ICD-10-CM | POA: Diagnosis not present

## 2021-12-16 DIAGNOSIS — R1312 Dysphagia, oropharyngeal phase: Secondary | ICD-10-CM | POA: Diagnosis not present

## 2021-12-16 DIAGNOSIS — I480 Paroxysmal atrial fibrillation: Secondary | ICD-10-CM | POA: Diagnosis not present

## 2021-12-16 DIAGNOSIS — R2681 Unsteadiness on feet: Secondary | ICD-10-CM | POA: Diagnosis not present

## 2021-12-16 DIAGNOSIS — R2689 Other abnormalities of gait and mobility: Secondary | ICD-10-CM | POA: Diagnosis not present

## 2021-12-17 DIAGNOSIS — E1165 Type 2 diabetes mellitus with hyperglycemia: Secondary | ICD-10-CM | POA: Diagnosis not present

## 2021-12-17 DIAGNOSIS — R2689 Other abnormalities of gait and mobility: Secondary | ICD-10-CM | POA: Diagnosis not present

## 2021-12-17 DIAGNOSIS — R2681 Unsteadiness on feet: Secondary | ICD-10-CM | POA: Diagnosis not present

## 2021-12-17 DIAGNOSIS — R1312 Dysphagia, oropharyngeal phase: Secondary | ICD-10-CM | POA: Diagnosis not present

## 2021-12-17 DIAGNOSIS — I480 Paroxysmal atrial fibrillation: Secondary | ICD-10-CM | POA: Diagnosis not present

## 2021-12-18 DIAGNOSIS — E1165 Type 2 diabetes mellitus with hyperglycemia: Secondary | ICD-10-CM | POA: Diagnosis not present

## 2021-12-18 DIAGNOSIS — I480 Paroxysmal atrial fibrillation: Secondary | ICD-10-CM | POA: Diagnosis not present

## 2021-12-18 DIAGNOSIS — R2689 Other abnormalities of gait and mobility: Secondary | ICD-10-CM | POA: Diagnosis not present

## 2021-12-18 DIAGNOSIS — R2681 Unsteadiness on feet: Secondary | ICD-10-CM | POA: Diagnosis not present

## 2022-01-04 DIAGNOSIS — M6281 Muscle weakness (generalized): Secondary | ICD-10-CM | POA: Diagnosis not present

## 2022-01-09 ENCOUNTER — Other Ambulatory Visit: Payer: Self-pay

## 2022-01-09 ENCOUNTER — Inpatient Hospital Stay (HOSPITAL_COMMUNITY): Payer: Medicare Other

## 2022-01-09 ENCOUNTER — Encounter (HOSPITAL_COMMUNITY): Payer: Self-pay

## 2022-01-09 ENCOUNTER — Inpatient Hospital Stay (HOSPITAL_COMMUNITY)
Admission: EM | Admit: 2022-01-09 | Discharge: 2022-01-13 | DRG: 689 | Disposition: A | Discharge status: Skilled Nursing Facility | Payer: Medicare Other | Attending: Internal Medicine | Admitting: Internal Medicine

## 2022-01-09 ENCOUNTER — Emergency Department (HOSPITAL_COMMUNITY): Payer: Medicare Other

## 2022-01-09 DIAGNOSIS — R262 Difficulty in walking, not elsewhere classified: Secondary | ICD-10-CM | POA: Diagnosis present

## 2022-01-09 DIAGNOSIS — Z7984 Long term (current) use of oral hypoglycemic drugs: Secondary | ICD-10-CM

## 2022-01-09 DIAGNOSIS — Z7401 Bed confinement status: Secondary | ICD-10-CM | POA: Diagnosis not present

## 2022-01-09 DIAGNOSIS — R29810 Facial weakness: Secondary | ICD-10-CM | POA: Diagnosis not present

## 2022-01-09 DIAGNOSIS — Z803 Family history of malignant neoplasm of breast: Secondary | ICD-10-CM

## 2022-01-09 DIAGNOSIS — Z83438 Family history of other disorder of lipoprotein metabolism and other lipidemia: Secondary | ICD-10-CM

## 2022-01-09 DIAGNOSIS — M797 Fibromyalgia: Secondary | ICD-10-CM | POA: Diagnosis present

## 2022-01-09 DIAGNOSIS — R4701 Aphasia: Secondary | ICD-10-CM | POA: Diagnosis present

## 2022-01-09 DIAGNOSIS — R739 Hyperglycemia, unspecified: Secondary | ICD-10-CM | POA: Diagnosis not present

## 2022-01-09 DIAGNOSIS — Z8249 Family history of ischemic heart disease and other diseases of the circulatory system: Secondary | ICD-10-CM | POA: Diagnosis not present

## 2022-01-09 DIAGNOSIS — F0393 Unspecified dementia, unspecified severity, with mood disturbance: Secondary | ICD-10-CM | POA: Diagnosis present

## 2022-01-09 DIAGNOSIS — I480 Paroxysmal atrial fibrillation: Secondary | ICD-10-CM | POA: Diagnosis present

## 2022-01-09 DIAGNOSIS — M1990 Unspecified osteoarthritis, unspecified site: Secondary | ICD-10-CM | POA: Diagnosis not present

## 2022-01-09 DIAGNOSIS — G9341 Metabolic encephalopathy: Secondary | ICD-10-CM | POA: Diagnosis not present

## 2022-01-09 DIAGNOSIS — R410 Disorientation, unspecified: Secondary | ICD-10-CM | POA: Diagnosis not present

## 2022-01-09 DIAGNOSIS — R531 Weakness: Secondary | ICD-10-CM | POA: Diagnosis not present

## 2022-01-09 DIAGNOSIS — Z792 Long term (current) use of antibiotics: Secondary | ICD-10-CM

## 2022-01-09 DIAGNOSIS — Z91040 Latex allergy status: Secondary | ICD-10-CM

## 2022-01-09 DIAGNOSIS — R0689 Other abnormalities of breathing: Secondary | ICD-10-CM | POA: Diagnosis not present

## 2022-01-09 DIAGNOSIS — Z91013 Allergy to seafood: Secondary | ICD-10-CM | POA: Diagnosis not present

## 2022-01-09 DIAGNOSIS — F32A Depression, unspecified: Secondary | ICD-10-CM | POA: Diagnosis present

## 2022-01-09 DIAGNOSIS — Z823 Family history of stroke: Secondary | ICD-10-CM

## 2022-01-09 DIAGNOSIS — G459 Transient cerebral ischemic attack, unspecified: Secondary | ICD-10-CM | POA: Diagnosis present

## 2022-01-09 DIAGNOSIS — R404 Transient alteration of awareness: Secondary | ICD-10-CM | POA: Diagnosis not present

## 2022-01-09 DIAGNOSIS — G9340 Encephalopathy, unspecified: Secondary | ICD-10-CM | POA: Diagnosis not present

## 2022-01-09 DIAGNOSIS — Z886 Allergy status to analgesic agent status: Secondary | ICD-10-CM | POA: Diagnosis not present

## 2022-01-09 DIAGNOSIS — E039 Hypothyroidism, unspecified: Secondary | ICD-10-CM | POA: Diagnosis not present

## 2022-01-09 DIAGNOSIS — R9431 Abnormal electrocardiogram [ECG] [EKG]: Secondary | ICD-10-CM | POA: Diagnosis not present

## 2022-01-09 DIAGNOSIS — N3000 Acute cystitis without hematuria: Secondary | ICD-10-CM

## 2022-01-09 DIAGNOSIS — Z79890 Hormone replacement therapy (postmenopausal): Secondary | ICD-10-CM | POA: Diagnosis not present

## 2022-01-09 DIAGNOSIS — E538 Deficiency of other specified B group vitamins: Secondary | ICD-10-CM | POA: Diagnosis present

## 2022-01-09 DIAGNOSIS — Z807 Family history of other malignant neoplasms of lymphoid, hematopoietic and related tissues: Secondary | ICD-10-CM

## 2022-01-09 DIAGNOSIS — Z87440 Personal history of urinary (tract) infections: Secondary | ICD-10-CM | POA: Diagnosis present

## 2022-01-09 DIAGNOSIS — R4182 Altered mental status, unspecified: Secondary | ICD-10-CM | POA: Diagnosis not present

## 2022-01-09 DIAGNOSIS — Z79899 Other long term (current) drug therapy: Secondary | ICD-10-CM

## 2022-01-09 DIAGNOSIS — Z7901 Long term (current) use of anticoagulants: Secondary | ICD-10-CM

## 2022-01-09 DIAGNOSIS — G928 Other toxic encephalopathy: Secondary | ICD-10-CM

## 2022-01-09 DIAGNOSIS — F341 Dysthymic disorder: Secondary | ICD-10-CM | POA: Diagnosis present

## 2022-01-09 DIAGNOSIS — E785 Hyperlipidemia, unspecified: Secondary | ICD-10-CM | POA: Diagnosis not present

## 2022-01-09 DIAGNOSIS — Z794 Long term (current) use of insulin: Secondary | ICD-10-CM | POA: Diagnosis not present

## 2022-01-09 DIAGNOSIS — Z882 Allergy status to sulfonamides status: Secondary | ICD-10-CM

## 2022-01-09 DIAGNOSIS — E1165 Type 2 diabetes mellitus with hyperglycemia: Secondary | ICD-10-CM | POA: Diagnosis not present

## 2022-01-09 DIAGNOSIS — N390 Urinary tract infection, site not specified: Secondary | ICD-10-CM | POA: Diagnosis not present

## 2022-01-09 DIAGNOSIS — I10 Essential (primary) hypertension: Secondary | ICD-10-CM | POA: Diagnosis not present

## 2022-01-09 DIAGNOSIS — H9190 Unspecified hearing loss, unspecified ear: Secondary | ICD-10-CM

## 2022-01-09 DIAGNOSIS — Z8673 Personal history of transient ischemic attack (TIA), and cerebral infarction without residual deficits: Secondary | ICD-10-CM

## 2022-01-09 DIAGNOSIS — I4891 Unspecified atrial fibrillation: Secondary | ICD-10-CM | POA: Diagnosis present

## 2022-01-09 LAB — DIFFERENTIAL
Abs Immature Granulocytes: 0.02 10*3/uL (ref 0.00–0.07)
Basophils Absolute: 0 10*3/uL (ref 0.0–0.1)
Basophils Relative: 0 %
Eosinophils Absolute: 0.1 10*3/uL (ref 0.0–0.5)
Eosinophils Relative: 1 %
Immature Granulocytes: 0 %
Lymphocytes Relative: 29 %
Lymphs Abs: 2.1 10*3/uL (ref 0.7–4.0)
Monocytes Absolute: 0.6 10*3/uL (ref 0.1–1.0)
Monocytes Relative: 9 %
Neutro Abs: 4.2 10*3/uL (ref 1.7–7.7)
Neutrophils Relative %: 61 %

## 2022-01-09 LAB — COMPREHENSIVE METABOLIC PANEL
ALT: 16 U/L (ref 0–44)
AST: 18 U/L (ref 15–41)
Albumin: 3.4 g/dL — ABNORMAL LOW (ref 3.5–5.0)
Alkaline Phosphatase: 61 U/L (ref 38–126)
Anion gap: 12 (ref 5–15)
BUN: 12 mg/dL (ref 8–23)
CO2: 24 mmol/L (ref 22–32)
Calcium: 9.3 mg/dL (ref 8.9–10.3)
Chloride: 101 mmol/L (ref 98–111)
Creatinine, Ser: 0.98 mg/dL (ref 0.44–1.00)
GFR, Estimated: 57 mL/min — ABNORMAL LOW (ref >60)
Glucose, Bld: 340 mg/dL — ABNORMAL HIGH (ref 70–99)
Potassium: 4.3 mmol/L (ref 3.5–5.1)
Sodium: 137 mmol/L (ref 135–145)
Total Bilirubin: 0.6 mg/dL (ref 0.3–1.2)
Total Protein: 6.9 g/dL (ref 6.5–8.1)

## 2022-01-09 LAB — URINALYSIS, ROUTINE W REFLEX MICROSCOPIC
Bilirubin Urine: NEGATIVE
Glucose, UA: 150 mg/dL — AB
Hgb urine dipstick: NEGATIVE
Ketones, ur: NEGATIVE mg/dL
Nitrite: POSITIVE — AB
Protein, ur: NEGATIVE mg/dL
Specific Gravity, Urine: 1.023 (ref 1.005–1.030)
pH: 5 (ref 5.0–8.0)

## 2022-01-09 LAB — CBC
HCT: 35.6 % — ABNORMAL LOW (ref 36.0–46.0)
Hemoglobin: 11.8 g/dL — ABNORMAL LOW (ref 12.0–15.0)
MCH: 29.8 pg (ref 26.0–34.0)
MCHC: 33.1 g/dL (ref 30.0–36.0)
MCV: 89.9 fL (ref 80.0–100.0)
Platelets: 245 10*3/uL (ref 150–400)
RBC: 3.96 MIL/uL (ref 3.87–5.11)
RDW: 13.1 % (ref 11.5–15.5)
WBC: 7 10*3/uL (ref 4.0–10.5)
nRBC: 0 % (ref 0.0–0.2)

## 2022-01-09 LAB — I-STAT CHEM 8, ED
BUN: 13 mg/dL (ref 8–23)
Calcium, Ion: 1.03 mmol/L — ABNORMAL LOW (ref 1.15–1.40)
Chloride: 102 mmol/L (ref 98–111)
Creatinine, Ser: 0.7 mg/dL (ref 0.44–1.00)
Glucose, Bld: 350 mg/dL — ABNORMAL HIGH (ref 70–99)
HCT: 35 % — ABNORMAL LOW (ref 36.0–46.0)
Hemoglobin: 11.9 g/dL — ABNORMAL LOW (ref 12.0–15.0)
Potassium: 4.2 mmol/L (ref 3.5–5.1)
Sodium: 137 mmol/L (ref 135–145)
TCO2: 22 mmol/L (ref 22–32)

## 2022-01-09 LAB — PROTIME-INR
INR: 1.2 (ref 0.8–1.2)
Prothrombin Time: 15.1 seconds (ref 11.4–15.2)

## 2022-01-09 LAB — LACTIC ACID, PLASMA: Lactic Acid, Venous: 2.9 mmol/L (ref 0.5–1.9)

## 2022-01-09 LAB — TSH: TSH: 0.014 u[IU]/mL — ABNORMAL LOW (ref 0.350–4.500)

## 2022-01-09 LAB — RAPID URINE DRUG SCREEN, HOSP PERFORMED
Amphetamines: NOT DETECTED
Barbiturates: NOT DETECTED
Benzodiazepines: NOT DETECTED
Cocaine: NOT DETECTED
Opiates: NOT DETECTED
Tetrahydrocannabinol: NOT DETECTED

## 2022-01-09 LAB — CBG MONITORING, ED: Glucose-Capillary: 344 mg/dL — ABNORMAL HIGH (ref 70–99)

## 2022-01-09 LAB — APTT: aPTT: 32 seconds (ref 24–36)

## 2022-01-09 LAB — ETHANOL: Alcohol, Ethyl (B): <10 mg/dL (ref <10)

## 2022-01-09 LAB — AMMONIA: Ammonia: 13 umol/L (ref 9–35)

## 2022-01-09 MED ORDER — SODIUM CHLORIDE 0.9 % IV SOLN
2.0000 g | Freq: Once | INTRAVENOUS | Status: AC
  Administered 2022-01-09: 2 g via INTRAVENOUS
  Filled 2022-01-09: qty 20

## 2022-01-09 MED ORDER — SODIUM CHLORIDE 0.9 % IV SOLN
1.0000 g | INTRAVENOUS | Status: DC
  Administered 2022-01-10 – 2022-01-12 (×3): 1 g via INTRAVENOUS
  Filled 2022-01-09 (×3): qty 10

## 2022-01-09 MED ORDER — INSULIN ASPART 100 UNIT/ML IJ SOLN
10.0000 [IU] | Freq: Once | INTRAMUSCULAR | Status: AC
  Administered 2022-01-09: 10 [IU] via SUBCUTANEOUS

## 2022-01-09 NOTE — Consult Note (Signed)
NEUROLOGY CONSULTATION NOTE   Date of service: January 09, 2022 Patient Name: Jaclyn [name] MRN:  [id number] DOB:  Apr 06, 1935 Reason for consult: "stroke code for confusion" Requesting Provider: No att. providers found _ _ _   _ __   _ __ _ _  __ __   _ __   __ _  History of Present Illness  Jaclyn [name] is a 86 y.o. female with PMH significant for DM2, HTN, prior TIA, atrial fibrillation on Eliquis, hypothyroidism, recurrent UTIs, insomnia who lives at [facility] and was home for the holidays.  She seemed at her baseline between 2000 and 2030, when she developed confusion and not answering questions correctly. She was with her son who called EMS.  On arrival, patient confused, not oriented to month and her age. She is very hard of hearing.  I spoke to her son over the phone, patient took her eliquis just an hour prior to arrival. They sat down for dinner and patient started eating fine but in the middle, she was having problems using the spoon and unable to get the right words out. She seemed confused and was having a hard time following instructions. Son called EMS.   Son reports hx of TIAs but also UTI. She has had recurrent UTI and when she gets UTI, she gets confused. Urine was not particularly foul smelling but it was dark in her diaper over the last day.  LKW: 2000 on 01/09/22 mRS: 4. Has trouble walking and pretty much been in bed since January 2023. tNKASE: not offered, no focal deficit and patient on eliquis. Thrombectomy: not offered, low suspicion for stroke with no focal deficit NIHSS components Score: Comment  1a Level of Conscious 0'[x]'$  1'[]'$  2'[]'$  3'[]'$      1b LOC Questions 0'[]'$  1'[]'$  2'[x]'$       1c LOC Commands 0'[x]'$  1'[]'$  2'[]'$       2 Best Gaze 0'[x]'$  1'[]'$  2'[]'$       3 Visual 0'[x]'$  1'[]'$  2'[]'$  3'[]'$      4 Facial Palsy 0'[x]'$  1'[]'$  2'[]'$  3'[]'$      5a Motor Arm - left 0'[x]'$  1'[]'$  2'[]'$  3'[]'$  4'[]'$  UN'[]'$    5b Motor Arm - Right 0'[x]'$  1'[]'$  2'[]'$  3'[]'$  4'[]'$  UN'[]'$    6a Motor Leg - Left 0'[x]'$  1'[]'$  2'[]'$  3'[]'$  4'[]'$  UN'[]'$    6b  Motor Leg - Right 0'[x]'$  1'[]'$  2'[]'$  3'[]'$  4'[]'$  UN'[]'$    7 Limb Ataxia 0'[x]'$  1'[]'$  2'[]'$  3'[]'$  UN'[]'$     8 Sensory 0'[x]'$  1'[]'$  2'[]'$  UN'[]'$      9 Best Language 0'[x]'$  1'[]'$  2'[]'$  3'[]'$      10 Dysarthria 0'[x]'$  1'[]'$  2'[]'$  UN'[]'$      11 Extinct. and Inattention 0'[x]'$  1'[]'$  2'[]'$       TOTAL: 2     ROS   Unable to ger detailed ROS 2/2 confusion but also hard of hearing.  Past History   Past Medical History:  Diagnosis Date   Atrial fibrillation ([place])    Benign neoplasm of colon    Carotid artery occlusion    60-79% right ICA stenosis   Difficult intubation 02/17/2006   During surgery to remove large polyp   Diverticulosis of colon (without mention of hemorrhage)    Dysthymic disorder    Fibromyalgia    Headache(784.0)    Insomnia, unspecified    Interstitial cystitis    Osteoarthrosis, unspecified whether generalized or localized, unspecified site    Other and unspecified hyperlipidemia    Other chest pain    Other specified benign mammary dysplasias  TIA (transient ischemic attack)    Type II or unspecified type diabetes mellitus without mention of complication, not stated as uncontrolled    Unspecified essential hypertension    Unspecified hypothyroidism    Unspecified vitamin D deficiency    Urinary tract infection, site not specified    Past Surgical History:  Procedure Laterality Date   ABDOMINAL HYSTERECTOMY  1988   cervical dysplasia   CARDIAC CATHETERIZATION  02/19/06   EF 60%   COLONOSCOPY     ENDARTERECTOMY Right 08/26/2019   Procedure: RIGHT CAROTID ENDARTERECTOMY;  Surgeon: Rosetta Posner, MD;  Location: [facility] OR;  Service: Vascular;  Laterality: Right;   PATCH ANGIOPLASTY Right 08/26/2019   Procedure: PATCH ANGIOPLASTY OF RIGHT COMMON CAROTID ARTERY USING HEMASHIELD PLATINUM FINESSE PATCH;  Surgeon: Rosetta Posner, MD;  Location: [facility] OR;  Service: Vascular;  Laterality: Right;   RIGHT COLECTOMY  2005   for villous adenoma of the cecum Dr.streck   VESICOVAGINAL FISTULA CLOSURE W/ TAH  1990   w/ cystocele  &retocele repairs Dr. Ree Edman   Family History  Problem Relation Age of Onset   Lymphoma Father    Hypertension Father    Stroke Father    Hyperlipidemia Father    Hypertension Mother    Allergies Brother    Hypertension Sister        3   Breast cancer Sister    Fibromyalgia Sister        3   Hyperlipidemia Sister        3   Social History   Socioeconomic History   Marital status: Single    Spouse name: Not on file   Number of children: 3   Years of education: Not on file   Highest education level: Not on file  Occupational History   Occupation: retired    Fish farm manager: RETIRED  Tobacco Use   Smoking status: Never   Smokeless tobacco: Never  Substance and Sexual Activity   Alcohol use: No    Alcohol/week: 0.0 standard drinks of alcohol   Drug use: No   Sexual activity: Not on file  Other Topics Concern   Not on file  Social History Narrative   1 child died at 34 months old --hx of downs syndrome  Lives with son in a 2 story home.  Uses the 1st floor.  Has two living children.  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     Social Determinants of Health   Financial Resource Strain: High Risk (02/06/2021)   Overall Financial Resource Strain (CARDIA)    Difficulty of Paying Living Expenses: Hard  Food Insecurity: Not on file  Transportation Needs: Not on file  Physical Activity: Not on file  Stress: Not on file  Social Connections: Not on file   Allergies  Allergen Reactions   Naproxen Sodium Other (See Comments)    Fever/aches and pains   Statins Other (See Comments)    myalgias   Sulfonamide Derivatives Hives and Itching    Not documented on MAR   Latex Itching and Rash   Shellfish Allergy Itching, Swelling and Rash    Seafood, shrimp    Medications  (Not in a hospital admission)    Vitals   Vitals:   01/09/22 2200  Weight: 59.6 kg     Body mass index is 21.21 kg/m.  Physical Exam   General: Laying comfortably in bed; in no acute distress.   HENT: Normal oropharynx and mucosa. Normal external appearance of ears and nose.  Neck: Supple, no pain or tenderness  CV: No JVD. No peripheral edema.  Pulmonary: Symmetric Chest rise. Normal respiratory effort.  Abdomen: Soft to touch, non-tender.  Ext: No cyanosis, edema, or deformity  Skin: No rash. Normal palpation of skin.   Musculoskeletal: Normal digits and nails by inspection. No clubbing.   Neurologic Examination  Mental status/Cognition: Alert, makes eye contact on left and right. oriented to self, but not to place, month and year, poor attention.  Speech/language: Non fluent and assessment somewhat limited due to her being really hard of hearing. Comprehension intact, object naming intact, repetition intact. Cranial nerves:   CN II Pupils equal and reactive to light, no obvious VF deficit, but VF deficit eval limited by being hard of hearing and confusion.   CN III,IV,VI EOM intact, no gaze preference or deviation, no nystagmus   CN V normal sensation in V1, V2, and V3 segments bilaterally    CN VII no asymmetry, no nasolabial fold flattening but does have facial folds.   CN VIII normal hearing to speech    CN IX & X normal palatal elevation, no uvular deviation    CN XI 5/5 head turn and 5/5 shoulder shrug bilaterally    CN XII midline tongue protrusion    Motor:  Muscle bulk: normal, tone normal, pronator drift none, tremor none Mvmt Root Nerve  Muscle Right Left Comments  SA C5/6 Ax Deltoid     EF C5/6 Mc Biceps 5 5   EE C6/7/8 Rad Triceps 5 5   WF C6/7 Med FCR     WE C7/8 PIN ECU     F Ab C8/T1 U ADM/FDI 5 5   HF L1/2/3 Fem Illopsoas 4 4   KE L2/3/4 Fem Quad     DF L4/5 D Peron Tib Ant 5 5   PF S1/2 Tibial Grc/Sol 5 5    Sensation:  Light touch Intact throughout   Pin prick    Temperature    Vibration   Proprioception    Coordination/Complex Motor:  - Finger to Nose intact BL - Heel to shin unable to get her to do - Rapid alternating movement are  slowed - Gait: deferred, she has been bed bound since Jan 2023.  Labs   CBC: No results for input(s): "WBC", "NEUTROABS", "HGB", "HCT", "MCV", "PLT" in the last 168 hours.  Basic Metabolic Panel:  Lab Results  Component Value Date   NA 136 11/18/2021   K 3.8 11/18/2021   CO2 25 11/18/2021   GLUCOSE 335 (H) 11/18/2021   BUN 11 11/18/2021   CREATININE 0.85 11/18/2021   CALCIUM 8.6 (L) 11/18/2021   GFRNONAA >60 11/18/2021   GFRAA >60 09/05/2019   Lipid Panel:  Lab Results  Component Value Date   LDLCALC 103 (H) 08/22/2019   HgbA1c:  Lab Results  Component Value Date   HGBA1C 7.5 (H) 11/16/2021   Urine Drug Screen:     Component Value Date/Time   LABOPIA NONE DETECTED 06/20/2017 0019   COCAINSCRNUR NONE DETECTED 06/20/2017 0019   LABBENZ NONE DETECTED 06/20/2017 0019   AMPHETMU NONE DETECTED 06/20/2017 0019   THCU NONE DETECTED 06/20/2017 0019   LABBARB NONE DETECTED 06/20/2017 0019    Alcohol Level     Component Value Date/Time   ETH <10 06/19/2017 2247    CT Head without contrast(Personally reviewed): CTH was negative for a large hypodensity concerning for a large territory infarct or hyperdensity concerning for an ICH  MRI Brain: pending  Impression  Jaclyn [name] is a 86 y.o. female with PMH significant for DM2, HTN, prior TIA, atrial fibrillation on Eliquis, hypothyroidism, recurrent UTIs, insomnia who lives at [facility] and was home for the holidays.  She seemed at her baseline between 2000 and 2030, when she developed confusion and not answering questions correctly. She is compliant with her eliquis.  Neuro exam more consistent with encephalopathy and mild delirium rather than pure word finding difficulty or aphasia. She is disoriented.  Althou hard to rule out stroke, could be a TIA. However, suspicion is low overall since she is compliant with her eliquis and is on the appropriate prevention for strokes in the setting of afibb. Will get an MRI  brain just to rule out stroke but I suspect that her encephalopathy is likely due to toxic metabolic encephalopathy.  Recommendations  - MRI Brain w/o C to rule out stroke. - routine EEG. However, son did not notice any seizure like activity. - Metabolic and infectious evaluation with CBC, chemistry, UA with reflex to Ucx, CXR, Procalcitonin, lactate and Ammonia. - evaluate for nutritional causes of encephalopathy with Vit B12, folate, TSH. However, these would have a much more insidious course. ______________________________________________________________________   Thank you for the opportunity to take part in the care of this patient. If you have any further questions, please contact the neurology consultation attending.  Signed,  [place] Pager Number [phone] _ _ _   _ __   _ __ _ _  __ __   _ __   __ _

## 2022-01-09 NOTE — H&P (Signed)
History and Physical    Jaclyn [name] VFI:[id number] DOB: 13-Jun-1935 DOA: 01/09/2022  PCP: Jinny Sanders, MD  Patient coming from: Home   Chief Complaint: Confusion, aphasia  HPI: Jaclyn [name] is a 86 y.o. female with medical history significant of Afib, dysthymia, FM, interstitial cystitis, OA, DM2, HTN, hypothyroidism who presented with confusion, receptive aphasia and word finding issues.  Her son called EMS.  Also noted problems with the spoon at dinner.  In the ED, Neurology was consulted and saw the patient.  UA was + for nitrites.  Patient is confused and cannot answer orientation questions beyond her name.   She does not remember the events and did not know she was in the hospital.   Collateral information obtained from her son, Jaclyn [name].  Currently, [name] lives at [facility] and rehab.  Family brought her home for the weekend for Thanksgiving.  She gets all of her medications at [facility] and he is not aware of any changes made recently.  He confirmed her presentation reported above and he does note that she has a history of TIAs and recurrent UTIs.  He felt like her symptoms, with the abrupt onset and speech problems were more consistent with a TIA this evening.   ED Course: In the ED, she was noted to have a glucose of 340, Cr of 0.98, AG of 12, bicarb of 24.  She was given 10 units of insulin.  She had an albumin of 3.4.  H/H were mildly low at 11/35.  UA showed moderate leukocytes, positive nitrites, many bacteria, few squam's and 21-50 WBC.  This has been consistent for most of the year, but is consistent with a recurrent UTI.  UC is sent.  UDS was neg.  ETOH level was negative. CT head, code stroke, showed no acute process.  Neurology was consulted and recommended MRI, EEG and work up for metabolic causes.   WBC is 7.0 and she is afebrile.   Review of Systems: As per HPI otherwise all other systems reviewed and are negative.   Past Medical History:   Diagnosis Date   Atrial fibrillation ([place])    Benign neoplasm of colon    Carotid artery occlusion    60-79% right ICA stenosis   Difficult intubation 02/17/2006   During surgery to remove large polyp   Diverticulosis of colon (without mention of hemorrhage)    Dysthymic disorder    Fibromyalgia    Headache(784.0)    Insomnia, unspecified    Interstitial cystitis    Osteoarthrosis, unspecified whether generalized or localized, unspecified site    Other and unspecified hyperlipidemia    Other chest pain    Other specified benign mammary dysplasias    TIA (transient ischemic attack)    Type II or unspecified type diabetes mellitus without mention of complication, not stated as uncontrolled    Unspecified essential hypertension    Unspecified hypothyroidism    Unspecified vitamin D deficiency    Urinary tract infection, site not specified     Past Surgical History:  Procedure Laterality Date   ABDOMINAL HYSTERECTOMY  1988   cervical dysplasia   CARDIAC CATHETERIZATION  02/19/06   EF 60%   COLONOSCOPY     ENDARTERECTOMY Right 08/26/2019   Procedure: RIGHT CAROTID ENDARTERECTOMY;  Surgeon: Rosetta Posner, MD;  Location: [facility] OR;  Service: Vascular;  Laterality: Right;   PATCH ANGIOPLASTY Right 08/26/2019   Procedure: PATCH ANGIOPLASTY OF RIGHT COMMON CAROTID ARTERY USING HEMASHIELD PLATINUM FINESSE  PATCH;  Surgeon: Rosetta Posner, MD;  Location: [facility] OR;  Service: Vascular;  Laterality: Right;   RIGHT COLECTOMY  2005   for villous adenoma of the cecum Dr.streck   VESICOVAGINAL FISTULA CLOSURE W/ TAH  1990   w/ cystocele &retocele repairs Dr. Ree Edman    Social History  reports that she has never smoked. She has never used smokeless tobacco. She reports that she does not drink alcohol and does not use drugs.  Allergies  Allergen Reactions   Naproxen Sodium Other (See Comments)    Fever/aches and pains   Statins Other (See Comments)    myalgias   Sulfonamide Derivatives Hives and  Itching    Not documented on MAR   Latex Itching and Rash   Shellfish Allergy Itching, Swelling and Rash    Seafood, shrimp    Family History  Problem Relation Age of Onset   Lymphoma Father    Hypertension Father    Stroke Father    Hyperlipidemia Father    Hypertension Mother    Allergies Brother    Hypertension Sister        3   Breast cancer Sister    Fibromyalgia Sister        3   Hyperlipidemia Sister        3    Awaiting Fax from [employer] for confirmation.  Prior to Admission medications   Medication Sig Start Date End Date Taking? Authorizing Provider  DULoxetine (CYMBALTA) 30 MG capsule TAKE 2 CAPSULES BY MOUTH EVERY DAY Patient taking differently: Take 60 mg by mouth daily. 12/27/19  Yes Bedsole, Amy E, MD  ELIQUIS 2.5 MG TABS tablet Take 2.5 mg by mouth 2 (two) times daily. 11/01/21  Yes [provider]  HUMALOG 100 UNIT/ML injection Inject 2-12 Units into the skin 4 (four) times daily -  before meals and at bedtime. Per sliding scale: 140-199= 2 units, 200-250= 6 units, 251-299= 8 units, 300-349= 10 units, 350-400= 12 units, 03/29/21  Yes [provider]  insulin glargine (LANTUS SOLOSTAR) 100 UNIT/ML Solostar Pen Inject 42 Units into the skin 2 (two) times daily. Patient taking differently: Inject 42 Units into the skin every evening. 02/25/21  Yes Thurnell Lose, MD  JANUVIA 100 MG tablet TAKE 1 TABLET BY MOUTH EVERY DAY Patient taking differently: Take 100 mg by mouth daily. 01/07/21  Yes Bedsole, Amy E, MD  levothyroxine (SYNTHROID) 200 MCG tablet Take 200 mcg by mouth daily before breakfast.   Yes [provider]  metFORMIN (GLUCOPHAGE) 1000 MG tablet Take 1,000 mg by mouth 2 (two) times daily. 10/28/21  Yes [provider]  trimethoprim (TRIMPEX) 100 MG tablet TAKE 1 TABLET BY MOUTH EVERY DAY Patient taking differently: Take 100 mg by mouth daily. 12/12/20  Yes Bedsole, Amy E, MD  B-D ULTRAFINE III SHORT PEN 31G X 8 MM MISC  USE TO INJECT INSULIN ONCE DAILY. DX: E11.65 07/02/20   Bedsole, Amy E, MD  cefpodoxime (VANTIN) 200 MG tablet Take 1 tablet (200 mg total) by mouth 2 (two) times daily. 11/18/21   Danford, Suann Larry, MD  Cholecalciferol (VITAMIN D3) 1.25 MG (50000 UT) CAPS Take 1 capsule by mouth once a week. Patient taking differently: Take 50,000 Units by mouth once a week. Thursday 12/27/20   Jinny Sanders, MD  docusate sodium (COLACE) 100 MG capsule Take 100 mg by mouth 2 (two) times daily.    [provider]  Omega-3 Fatty Acids (FISH OIL) 1000 MG  CAPS Take 1,000 mg by mouth daily.    [provider]    Physical Exam: Vitals:   01/09/22 2200 01/09/22 2225 01/09/22 2228  SpO2:  95%   Weight: 59.6 kg  59.6 kg  Height:   '5\' 6"'$  (1.676 m)    Constitutional: NAD, lying in bed, very hard of hearing Eyes: PERRL, lids and conjunctivae normal ENMT: Mucous membranes are mildly dry, poor dentition Neck: normal, supple Respiratory: breathing comfortably on room air Cardiovascular: RR, NR, no murmur noted, pulses intact in the radial arteries, 2+ Abdomen: + ttp in the suprapubic area, non distended, Decreased BS throughout Musculoskeletal: Normal tone and bulk, no deformities noted Skin: no rashes, lesions, ulcers on exposed skin, she has poor hygiene of her nails Neurologic: Severe decreased hearing, naming is correct 1/2, she was very confused by a key being shown to her.  Tongue midline, EOMI.  VF could not be elicited due to confusion and hearing deficit.  5/5 strength throughout.  Sensation intact to light touch.  Psychiatric: Alert and oriented x 1 only, name.    Labs on Admission: I have personally reviewed following labs and imaging studies  CBC: Recent Labs  Lab 01/09/22 2203 01/09/22 2204  WBC 7.0  --   NEUTROABS 4.2  --   HGB 11.8* 11.9*  HCT 35.6* 35.0*  MCV 89.9  --   PLT 245  --     Basic Metabolic Panel: Recent Labs  Lab 01/09/22 2203 01/09/22 2204  NA 137  137  K 4.3 4.2  CL 101 102  CO2 24  --   GLUCOSE 340* 350*  BUN 12 13  CREATININE 0.98 0.70  CALCIUM 9.3  --     GFR: Estimated Creatinine Clearance: 49 mL/min (by C-G formula based on SCr of 0.7 mg/dL).  Liver Function Tests: Recent Labs  Lab 01/09/22 2203  AST 18  ALT 16  ALKPHOS 61  BILITOT 0.6  PROT 6.9  ALBUMIN 3.4*    Urine analysis:    Component Value Date/Time   COLORURINE AMBER (A) 01/09/2022 2220   APPEARANCEUR CLOUDY (A) 01/09/2022 2220   LABSPEC 1.023 01/09/2022 2220   PHURINE 5.0 01/09/2022 2220   GLUCOSEU 150 (A) 01/09/2022 2220   GLUCOSEU >=1000 (A) 03/16/2019 0915   HGBUR NEGATIVE 01/09/2022 2220   BILIRUBINUR NEGATIVE 01/09/2022 2220   BILIRUBINUR Negative 12/25/2020 1243   KETONESUR NEGATIVE 01/09/2022 2220   PROTEINUR NEGATIVE 01/09/2022 2220   UROBILINOGEN 0.2 12/25/2020 1243   UROBILINOGEN 0.2 03/16/2019 0915   NITRITE POSITIVE (A) 01/09/2022 2220   LEUKOCYTESUR MODERATE (A) 01/09/2022 2220    Radiological Exams on Admission: CT HEAD CODE STROKE WO CONTRAST  Result Date: 01/09/2022 CLINICAL DATA:  Code stroke. Altered mental status, aphasia, right-sided facial droop EXAM: CT HEAD WITHOUT CONTRAST TECHNIQUE: Contiguous axial images were obtained from the base of the skull through the vertex without intravenous contrast. RADIATION DOSE REDUCTION: This exam was performed according to the departmental dose-optimization program which includes automated exposure control, adjustment of the mA and/or kV according to patient size and/or use of iterative reconstruction technique. COMPARISON:  11/15/2021 FINDINGS: Brain: No evidence of acute infarction, hemorrhage, cerebral edema, mass, mass effect, or midline shift. No hydrocephalus or extra-axial collection. Periventricular white matter changes, likely the sequela of chronic small vessel ischemic disease. Vascular: No hyperdense vessel. Atherosclerotic calcifications in the intracranial carotid and  vertebral arteries. Skull: Negative for fracture or focal lesion. Sinuses/Orbits: Status post left lens replacement. Clear paranasal sinuses. Other: None.  ASPECTS [facility] Stroke Program Early CT Score) - Ganglionic level infarction (caudate, lentiform nuclei, internal capsule, insula, M1-M3 cortex): 7 - Supraganglionic infarction (M4-M6 cortex): 3 Total score (0-10 with 10 being normal): 10 IMPRESSION: 1. No acute intracranial process. 2. ASPECTS is 10. Code stroke imaging results were communicated on 01/09/2022 at 10:13 pm to provider Dr. Lorrin Goodell via secure text paging. Electronically Signed   By: Merilyn Baba M.D.   On: 01/09/2022 22:13    EKG: Independently reviewed. NSR, TWI in aVL  Assessment/Plan  TIA with history of CVA Acute metabolic encephalopathy Hard of hearing - Symptoms are concerning for TIA, initial CT head negative for any acute process - Neurology following - MRI pending - TTE pending - Lipid panel pending, Last A1C 7.5 - Risk factor modification - Neuro checks - Telemetry - EEG - PT/OT/SLP  Acute lower UTI History of recurrent UTIs Acute metabolic encephalopathy - I am inclined to think her global encephalopathy is more related to an acute infection - UA + for nitrites - UC ordered, pending - Would get [facility] for any fever - Neurology recommended further work up for encephalopathy with PCT, lactate, ammonia, B12, folate and TSH which will be ordered - Rocephin started, will continue  Uncontrolled type 2 diabetes mellitus with hyperglycemia (HCC) - BS uncontrolled today - On long acting insulin at home - Lantus 10 units ordered - SSI - Currently NPO until bedside swallow, hold lantus if unable to take PO.   Hypothyroidism - Check TSH - Continue home synthroid    Essential hypertension, benign - Currently not on any reported medications - Fax from [employer] pending - Monitor for now.  Avoid hypotension given neurological changes    PAROXYSMAL ATRIAL  FIBRILLATION - Currently not on rate controlling medication - Continue eliquis    Fibromyalgia - Tylenol, monitor for pain  H/O B12 deficiency - Check B12  Dyslipidemia - Not on a statin due to intolerance - Hold Omega 3 fatty acids until after swallow study - Check lipid panel    Depression - Continue duloxetine    DVT prophylaxis: Eliquis  Code Status:   Full  Family Communication:  Son on the phone, [name] Disposition Plan:   Patient is from:  [place] to:  [place]  Anticipated DC date:  01/12/22  Anticipated DC barriers: Treatment ability, need for rehab  Consults called:  Neurology, Dr. Lorrin Goodell  Admission status:  INP, medical telemetry   Severity of Illness: The appropriate patient status for this patient is INPATIENT. Inpatient status is judged to be reasonable and necessary in order to provide the required intensity of service to ensure the patient's safety. The patient's presenting symptoms, physical exam findings, and initial radiographic and laboratory data in the context of their chronic comorbidities is felt to place them at high risk for further clinical deterioration. Furthermore, it is not anticipated that the patient will be medically stable for discharge from the hospital within 2 midnights of admission.   * I certify that at the point of admission it is my clinical judgment that the patient will require inpatient hospital care spanning beyond 2 midnights from the point of admission due to high intensity of service, high risk for further deterioration and high frequency of surveillance required.Gilles Chiquito MD [facility]  How to contact the [facility] Attending or Consulting provider [place] or covering provider during after hours [place], for this patient?   Check the care team in  [facility] and look for a) attending/consulting [facility] provider listed and b) the [facility] team listed Log into [email or web address] and use [place]'s universal password  to access. If you do not have the password, please contact the hospital operator. Locate the [facility] provider you are looking for under [facility] and page to a number that you can be directly reached. If you still have difficulty reaching the provider, please page the [facility] (Director on Call) for the Hospitalists listed on amion for assistance.  01/09/2022, 11:26 PM

## 2022-01-09 NOTE — ED Provider Notes (Signed)
[facility] EMERGENCY DEPARTMENT Provider Note   CSN: [id number] Arrival date & time: 01/09/22  2158  An emergency department physician performed an initial assessment on this suspected stroke patient at 2158.  History  Chief Complaint  Patient presents with   Code Stroke    Jaclyn [name] is a 86 y.o. female.  HPI   Pt is an 86 y/o female - coming from home by EMS - with report of abnormal behavior / speech and level of alertness -according to the patient family members reported to the paramedics she was at dinner, started to have some difficulty speaking then it was incomprehensible speech, she was not seen to be severely hypertensive nor was she in atrial fibrillation.  She is evidently on Eliquis and had her last dose this evening.  On arrival the patient is able to say where she is, she is able to answer questions and follow commands.  Home Medications Prior to Admission medications   Medication Sig Start Date End Date Taking? Authorizing Provider  DULoxetine (CYMBALTA) 30 MG capsule TAKE 2 CAPSULES BY MOUTH EVERY DAY Patient taking differently: Take 60 mg by mouth daily. 12/27/19  Yes Bedsole, Amy E, MD  ELIQUIS 2.5 MG TABS tablet Take 2.5 mg by mouth 2 (two) times daily. 11/01/21  Yes [provider]  HUMALOG 100 UNIT/ML injection Inject 2-12 Units into the skin 4 (four) times daily -  before meals and at bedtime. Per sliding scale: 140-199= 2 units, 200-250= 6 units, 251-299= 8 units, 300-349= 10 units, 350-400= 12 units, 03/29/21  Yes [provider]  insulin glargine (LANTUS SOLOSTAR) 100 UNIT/ML Solostar Pen Inject 42 Units into the skin 2 (two) times daily. Patient taking differently: Inject 42 Units into the skin every evening. 02/25/21  Yes Thurnell Lose, MD  JANUVIA 100 MG tablet TAKE 1 TABLET BY MOUTH EVERY DAY Patient taking differently: Take 100 mg by mouth daily. 01/07/21  Yes Bedsole, Amy E, MD  levothyroxine (SYNTHROID) 200 MCG  tablet Take 200 mcg by mouth daily before breakfast.   Yes [provider]  metFORMIN (GLUCOPHAGE) 1000 MG tablet Take 1,000 mg by mouth 2 (two) times daily. 10/28/21  Yes [provider]  trimethoprim (TRIMPEX) 100 MG tablet TAKE 1 TABLET BY MOUTH EVERY DAY Patient taking differently: Take 100 mg by mouth daily. 12/12/20  Yes Bedsole, Amy E, MD  B-D ULTRAFINE III SHORT PEN 31G X 8 MM MISC USE TO INJECT INSULIN ONCE DAILY. DX: E11.65 07/02/20   Bedsole, Amy E, MD  cefpodoxime (VANTIN) 200 MG tablet Take 1 tablet (200 mg total) by mouth 2 (two) times daily. 11/18/21   Danford, Suann Larry, MD  Cholecalciferol (VITAMIN D3) 1.25 MG (50000 UT) CAPS Take 1 capsule by mouth once a week. Patient taking differently: Take 50,000 Units by mouth once a week. Thursday 12/27/20   Jinny Sanders, MD  docusate sodium (COLACE) 100 MG capsule Take 100 mg by mouth 2 (two) times daily.    [provider]  Omega-3 Fatty Acids (FISH OIL) 1000 MG CAPS Take 1,000 mg by mouth daily.    [provider]      Allergies    Naproxen sodium, Statins, Sulfonamide derivatives, Latex, and Shellfish allergy    Review of Systems   Review of Systems  All other systems reviewed and are negative.   Physical Exam Updated Vital Signs Ht 1.676 m ('5\' 6"'$ )   Wt 59.6 kg   SpO2 95%   BMI  21.21 kg/m  Physical Exam Vitals and nursing note reviewed.  Constitutional:      General: She is not in acute distress.    Appearance: She is well-developed.  HENT:     Head: Normocephalic and atraumatic.     Mouth/Throat:     Pharynx: No oropharyngeal exudate.  Eyes:     General: No scleral icterus.       Right eye: No discharge.        Left eye: No discharge.     Conjunctiva/sclera: Conjunctivae normal.     Pupils: Pupils are equal, round, and reactive to light.  Neck:     Thyroid: No thyromegaly.     Vascular: No JVD.  Cardiovascular:     Rate and Rhythm: Normal rate and regular rhythm.      Heart sounds: Normal heart sounds. No murmur heard.    No friction rub. No gallop.  Pulmonary:     Effort: Pulmonary effort is normal. No respiratory distress.     Breath sounds: Normal breath sounds. No wheezing or rales.  Abdominal:     General: Bowel sounds are normal. There is no distension.     Palpations: Abdomen is soft. There is no mass.     Tenderness: There is no abdominal tenderness.  Musculoskeletal:        General: No tenderness. Normal range of motion.     Cervical back: Normal range of motion and neck supple.     Right lower leg: No edema.     Left lower leg: No edema.  Lymphadenopathy:     Cervical: No cervical adenopathy.  Skin:    General: Skin is warm and dry.     Findings: No erythema or rash.  Neurological:     Mental Status: She is alert.     Coordination: Coordination normal.     Comments: The patient has no facial droop, she is able to perform finger-nose-finger, she has equal grips, she has no pronator drift, she is able to move both legs to command, she has normal peripheral visual fields  Psychiatric:        Behavior: Behavior normal.     ED Results / Procedures / Treatments   Labs (all labs ordered are listed, but only abnormal results are displayed) Labs Reviewed  CBC - Abnormal; Notable for the following components:      Result Value   Hemoglobin 11.8 (*)    HCT 35.6 (*)    All other components within normal limits  COMPREHENSIVE METABOLIC PANEL - Abnormal; Notable for the following components:   Glucose, Bld 340 (*)    Albumin 3.4 (*)    GFR, Estimated 57 (*)    All other components within normal limits  URINALYSIS, ROUTINE W REFLEX MICROSCOPIC - Abnormal; Notable for the following components:   Color, Urine AMBER (*)    APPearance CLOUDY (*)    Glucose, UA 150 (*)    Nitrite POSITIVE (*)    Leukocytes,Ua MODERATE (*)    Bacteria, UA MANY (*)    All other components within normal limits  I-STAT CHEM 8, ED - Abnormal; Notable for the  following components:   Glucose, Bld 350 (*)    Calcium, Ion 1.03 (*)    Hemoglobin 11.9 (*)    HCT 35.0 (*)    All other components within normal limits  CBG MONITORING, ED - Abnormal; Notable for the following components:   Glucose-Capillary 344 (*)    All other components within normal  limits  ETHANOL  PROTIME-INR  APTT  DIFFERENTIAL  RAPID URINE DRUG SCREEN, HOSP PERFORMED  TSH  VITAMIN B12  FOLATE  AMMONIA  PROCALCITONIN  LACTIC ACID, PLASMA  LACTIC ACID, PLASMA    EKG EKG Interpretation  Date/Time:  Thursday January 09 2022 22:16:44 EST Ventricular Rate:  94 PR Interval:  154 QRS Duration: 76 QT Interval:  350 QTC Calculation: 438 R Axis:   -89 Text Interpretation: Sinus rhythm Inferolateral infarct, old Confirmed by Noemi Chapel [phone]) on 01/09/2022 10:19:54 PM  Radiology CT HEAD CODE STROKE WO CONTRAST  Result Date: 01/09/2022 CLINICAL DATA:  Code stroke. Altered mental status, aphasia, right-sided facial droop EXAM: CT HEAD WITHOUT CONTRAST TECHNIQUE: Contiguous axial images were obtained from the base of the skull through the vertex without intravenous contrast. RADIATION DOSE REDUCTION: This exam was performed according to the departmental dose-optimization program which includes automated exposure control, adjustment of the mA and/or kV according to patient size and/or use of iterative reconstruction technique. COMPARISON:  11/15/2021 FINDINGS: Brain: No evidence of acute infarction, hemorrhage, cerebral edema, mass, mass effect, or midline shift. No hydrocephalus or extra-axial collection. Periventricular white matter changes, likely the sequela of chronic small vessel ischemic disease. Vascular: No hyperdense vessel. Atherosclerotic calcifications in the intracranial carotid and vertebral arteries. Skull: Negative for fracture or focal lesion. Sinuses/Orbits: Status post left lens replacement. Clear paranasal sinuses. Other: None. ASPECTS [facility] Stroke  Program Early CT Score) - Ganglionic level infarction (caudate, lentiform nuclei, internal capsule, insula, M1-M3 cortex): 7 - Supraganglionic infarction (M4-M6 cortex): 3 Total score (0-10 with 10 being normal): 10 IMPRESSION: 1. No acute intracranial process. 2. ASPECTS is 10. Code stroke imaging results were communicated on 01/09/2022 at 10:13 pm to provider Dr. Lorrin Goodell via secure text paging. Electronically Signed   By: Merilyn Baba M.D.   On: 01/09/2022 22:13    Procedures Procedures    Medications Ordered in ED Medications  insulin aspart (novoLOG) injection 10 Units (has no administration in time range)  cefTRIAXone (ROCEPHIN) 2 g in sodium chloride 0.9 % 100 mL IVPB (has no administration in time range)    ED Course/ Medical Decision Making/ A&P                           Medical Decision Making Amount and/or Complexity of Data Reviewed Labs: ordered. Radiology: ordered.  Risk Decision regarding hospitalization.   This patient presents to the ED for concern of altered mental status and potential stroke with difficulty speaking, this involves an extensive number of treatment options, and is a complaint that carries with it a high risk of complications and morbidity.  The differential diagnosis includes encephalopathy, infection, hyperglycemia or hypoglycemia, could be acute stroke or hemorrhage   Co morbidities that complicate the patient evaluation  Recent admission to the hospital approximately 2 months ago with an acute infection, had been admitted about a year ago with the same, followed in the office very closely for chronic disease management including diabetes Insulin requiring diabetes   Additional history obtained:  Additional history obtained from electronic medical record External records from outside source obtained and reviewed including prior admissions to the hospital History of paroxysmal atrial fibrillation on Eliquis When the patient was admitted to  the hospital within the last 2 months it was noted that she had a Klebsiella urinary tract infection which was resistant to multiple drugs but sensitive to cephalosporins   Lab Tests:  I Ordered, and personally interpreted  labs.  The pertinent results include: Stroke work-up Labs hyperglycemia, no leukocytosis UTI present   Imaging Studies ordered:  I ordered imaging studies including CT scan of the head as well as angiogram I independently visualized and interpreted imaging which showed no acute findings I agree with the radiologist interpretation   Cardiac Monitoring: / EKG:  The patient was maintained on a cardiac monitor.  I personally viewed and interpreted the cardiac monitored which showed an underlying rhythm of: Normal sinus rhythm   Consultations Obtained:  I requested consultation with the neurologist who recommends admission with MRI and EEG and encephalopathic work-up,  and discussed lab and imaging findings as well as pertinent plan - they recommend: Admission Dr. Daryll Drown to admit to hospitalist.   Problem List / ED Course / Critical interventions / Medication management  Urinalysis with culture ordered, stroke work-up ordered, CTs reveal no signs of acute stroke, patient does not have any acute neurologic findings on exam at this time but has had waxing and waning aphasia Reevaluation of the patient after these medicines showed that the patient stable, borderline encephalopathic I have reviewed the patients home medicines and have made adjustments as needed Rocephin and Insulin started   Social Determinants of Health:  Recurrent infections, poorly controlled diabetes   Test / Admission - Considered:  We will need to be admitted to the hospital         Final Clinical Impression(s) / ED Diagnoses Final diagnoses:  Aphasia  Hyperglycemia  Acute encephalopathy  Acute cystitis without hematuria    Rx / DC Orders ED Discharge Orders     None          Noemi Chapel, MD 01/09/22 2248

## 2022-01-09 NOTE — ED Triage Notes (Signed)
Pt LSW 2000 01/09/22. Pt was eating and all of a sudden had trouble feeding herself, had expressive and receptive aphasia and right sided facial droop. Upon arrival pt facial droop resolved. Pt is confused A&OX1 to self. Pt son reports dark urine.

## 2022-01-09 NOTE — Code Documentation (Addendum)
Responded to Code Stroke called at 2137 for AMS, R facial droop, and aphasia, LSN-2000. Pt arrived at 2158, CBG-344, NIH-2 for LOC questions. CT head negative for acute changes. TNK not given-pt on eliquis, NIH-2 for confusion, no focal deficits or LVO signs. Plan for MRI, metabolic workup.

## 2022-01-10 ENCOUNTER — Inpatient Hospital Stay (HOSPITAL_COMMUNITY): Payer: Medicare Other

## 2022-01-10 DIAGNOSIS — G9341 Metabolic encephalopathy: Secondary | ICD-10-CM

## 2022-01-10 DIAGNOSIS — G459 Transient cerebral ischemic attack, unspecified: Secondary | ICD-10-CM

## 2022-01-10 DIAGNOSIS — R4701 Aphasia: Secondary | ICD-10-CM

## 2022-01-10 DIAGNOSIS — R4182 Altered mental status, unspecified: Secondary | ICD-10-CM

## 2022-01-10 LAB — CBC
HCT: 33 % — ABNORMAL LOW (ref 36.0–46.0)
Hemoglobin: 11 g/dL — ABNORMAL LOW (ref 12.0–15.0)
MCH: 29.6 pg (ref 26.0–34.0)
MCHC: 33.3 g/dL (ref 30.0–36.0)
MCV: 88.7 fL (ref 80.0–100.0)
Platelets: 151 10*3/uL (ref 150–400)
RBC: 3.72 MIL/uL — ABNORMAL LOW (ref 3.87–5.11)
RDW: 13.2 % (ref 11.5–15.5)
WBC: 6.1 10*3/uL (ref 4.0–10.5)
nRBC: 0 % (ref 0.0–0.2)

## 2022-01-10 LAB — BASIC METABOLIC PANEL
Anion gap: 13 (ref 5–15)
BUN: 12 mg/dL (ref 8–23)
CO2: 20 mmol/L — ABNORMAL LOW (ref 22–32)
Calcium: 9 mg/dL (ref 8.9–10.3)
Chloride: 104 mmol/L (ref 98–111)
Creatinine, Ser: 0.8 mg/dL (ref 0.44–1.00)
GFR, Estimated: >60 mL/min (ref >60)
Glucose, Bld: 236 mg/dL — ABNORMAL HIGH (ref 70–99)
Potassium: 4.5 mmol/L (ref 3.5–5.1)
Sodium: 137 mmol/L (ref 135–145)

## 2022-01-10 LAB — FOLATE: Folate: 20.6 ng/mL (ref >5.9)

## 2022-01-10 LAB — LIPID PANEL
Cholesterol: 149 mg/dL (ref 0–200)
HDL: 27 mg/dL — ABNORMAL LOW (ref >40)
LDL Cholesterol: 86 mg/dL (ref 0–99)
Total CHOL/HDL Ratio: 5.5 RATIO
Triglycerides: 178 mg/dL — ABNORMAL HIGH (ref <150)
VLDL: 36 mg/dL (ref 0–40)

## 2022-01-10 LAB — GLUCOSE, CAPILLARY
Glucose-Capillary: 186 mg/dL — ABNORMAL HIGH (ref 70–99)
Glucose-Capillary: 196 mg/dL — ABNORMAL HIGH (ref 70–99)
Glucose-Capillary: 359 mg/dL — ABNORMAL HIGH (ref 70–99)
Glucose-Capillary: 296 mg/dL — ABNORMAL HIGH (ref 70–99)

## 2022-01-10 LAB — ECHOCARDIOGRAM COMPLETE
Area-P 1/2: 5.38 cm2
Height: 66 in
S' Lateral: 2.1 cm
Weight: 2102.31 oz

## 2022-01-10 LAB — CBG MONITORING, ED: Glucose-Capillary: 163 mg/dL — ABNORMAL HIGH (ref 70–99)

## 2022-01-10 LAB — LACTIC ACID, PLASMA: Lactic Acid, Venous: 3.1 mmol/L (ref 0.5–1.9)

## 2022-01-10 LAB — VITAMIN B12: Vitamin B-12: 253 pg/mL (ref 180–914)

## 2022-01-10 LAB — PROCALCITONIN: Procalcitonin: <0.1 ng/mL

## 2022-01-10 MED ORDER — ONDANSETRON HCL 4 MG PO TABS: 4.0000 mg | ORAL_TABLET | Freq: Four times a day (QID) | ORAL | Status: DC | PRN

## 2022-01-10 MED ORDER — INSULIN ASPART 100 UNIT/ML IJ SOLN
0.0000 [IU] | Freq: Every day | INTRAMUSCULAR | Status: DC
  Administered 2022-01-10 – 2022-01-11 (×2): 3 [IU] via SUBCUTANEOUS
  Administered 2022-01-12: 4 [IU] via SUBCUTANEOUS

## 2022-01-10 MED ORDER — STROKE: EARLY STAGES OF RECOVERY BOOK: Freq: Once | Status: DC

## 2022-01-10 MED ORDER — INSULIN ASPART 100 UNIT/ML IJ SOLN
0.0000 [IU] | Freq: Three times a day (TID) | INTRAMUSCULAR | Status: DC
  Administered 2022-01-10: 15 [IU] via SUBCUTANEOUS
  Administered 2022-01-11: 2 [IU] via SUBCUTANEOUS
  Administered 2022-01-11: 3 [IU] via SUBCUTANEOUS
  Administered 2022-01-11: 8 [IU] via SUBCUTANEOUS
  Administered 2022-01-12: 11 [IU] via SUBCUTANEOUS
  Administered 2022-01-12: 8 [IU] via SUBCUTANEOUS
  Administered 2022-01-12: 5 [IU] via SUBCUTANEOUS
  Administered 2022-01-13: 11 [IU] via SUBCUTANEOUS
  Administered 2022-01-13: 5 [IU] via SUBCUTANEOUS

## 2022-01-10 MED ORDER — ACETAMINOPHEN 325 MG PO TABS: 650.0000 mg | ORAL_TABLET | Freq: Four times a day (QID) | ORAL | Status: DC | PRN

## 2022-01-10 MED ORDER — ACETAMINOPHEN 650 MG RE SUPP: 650.0000 mg | Freq: Four times a day (QID) | RECTAL | Status: DC | PRN

## 2022-01-10 MED ORDER — APIXABAN 2.5 MG PO TABS
2.5000 mg | ORAL_TABLET | Freq: Two times a day (BID) | ORAL | Status: DC
  Administered 2022-01-10 – 2022-01-13 (×7): 2.5 mg via ORAL
  Filled 2022-01-10 (×8): qty 1

## 2022-01-10 MED ORDER — LEVOTHYROXINE SODIUM 100 MCG PO TABS
200.0000 ug | ORAL_TABLET | Freq: Every day | ORAL | Status: DC
  Administered 2022-01-11 – 2022-01-12 (×2): 200 ug via ORAL
  Filled 2022-01-10 (×2): qty 2

## 2022-01-10 MED ORDER — DULOXETINE HCL 60 MG PO CPEP
60.0000 mg | ORAL_CAPSULE | Freq: Every day | ORAL | Status: DC
  Administered 2022-01-10 – 2022-01-13 (×4): 60 mg via ORAL
  Filled 2022-01-10 (×4): qty 1

## 2022-01-10 MED ORDER — INSULIN ASPART 100 UNIT/ML IJ SOLN
0.0000 [IU] | INTRAMUSCULAR | Status: DC
  Administered 2022-01-10 (×3): 3 [IU] via SUBCUTANEOUS

## 2022-01-10 MED ORDER — ONDANSETRON HCL 4 MG/2ML IJ SOLN: 4.0000 mg | Freq: Four times a day (QID) | INTRAMUSCULAR | Status: DC | PRN

## 2022-01-10 MED ORDER — INSULIN GLARGINE-YFGN 100 UNIT/ML ~~LOC~~ SOLN
10.0000 [IU] | Freq: Every day | SUBCUTANEOUS | Status: DC
  Administered 2022-01-10 – 2022-01-12 (×3): 10 [IU] via SUBCUTANEOUS
  Filled 2022-01-10 (×5): qty 0.1

## 2022-01-10 NOTE — Evaluation (Signed)
Occupational Therapy Evaluation and Discharge Patient Details Name: Jaclyn [name] MRN: [id number] DOB: 1935-10-17 Today's Date: 01/10/2022   History of Present Illness Shuntae Calderone is a 86 y.o. female who presented with confusion, receptive aphasia and word finding issues. Found to have acute metabolic encephalopathy secondary to UTI, CT/MRI negative for acute events. PHMx: Afib, dysthymia, FM, interstitial cystitis, OA, DM2, HTN, hypothyroidism   Clinical Impression   This 86 yo female admitted with above presents to acute OT with PLOF of needing A for all basic ADLs and +1/+2 for stand pivot tranfers bed<>wheelchair. She could feed herself post setup per son. From a self care standpoint is she back to her baseline, acute OT will sign off.      Recommendations for follow up therapy are one component of a multi-disciplinary discharge planning process, led by the attending physician.  Recommendations may be updated based on patient status, additional functional criteria and insurance authorization.   Follow Up Recommendations  No OT follow up     Assistance Recommended at Discharge Frequent or constant Supervision/Assistance  Patient can return home with the following A lot of help with walking and/or transfers;A lot of help with bathing/dressing/bathroom;Assistance with cooking/housework;Assistance with feeding;Help with stairs or ramp for entrance;Assist for transportation;Direct supervision/assist for financial management;Direct supervision/assist for medications management    Functional Status Assessment  Patient has not had a recent decline in their functional status  Equipment Recommendations  None recommended by OT       Precautions / Restrictions Precautions Precautions: Fall Restrictions Weight Bearing Restrictions: No             ADL either performed or assessed with clinical judgement   ADL                                         General ADL  Comments: total A except for self feeding which today she does with setup/S     Vision Patient Visual Report: No change from baseline              Pertinent Vitals/Pain Pain Assessment Pain Assessment: Faces Faces Pain Scale: No hurt     Hand Dominance Right   Extremity/Trunk Assessment Upper Extremity Assessment Upper Extremity Assessment: Generalized weakness           Communication Communication Communication: HOH   Cognition Arousal/Alertness: Awake/alert Behavior During Therapy: WFL for tasks assessed/performed Overall Cognitive Status: History of cognitive impairments - at baseline                                 General Comments: dementia                Home Living Family/patient expects to be discharged to:: Skilled nursing facility                                        Prior Functioning/Environment               Mobility Comments: per son pt is a +1/+2 stand pivot transfer without AD from bed<>wheelchair and is non ambulatory at baseline ADLs Comments: per son pt is total care for basic ADLs other than self feeding post setup        OT Problem  List: Decreased cognition         OT Goals(Current goals can be found in the care plan section) Acute Rehab OT Goals Patient Stated Goal: per son to go back to [facility] and not continue to have UTIs         AM-PAC OT "6 Clicks" Daily Activity     Outcome Measure Help from another person eating meals?: A Little Help from another person taking care of personal grooming?: A Little Help from another person toileting, which includes using toliet, bedpan, or urinal?: Total Help from another person bathing (including washing, rinsing, drying)?: Total Help from another person to put on and taking off regular upper body clothing?: Total Help from another person to put on and taking off regular lower body clothing?: Total 6 Click Score: 10   End of Session Nurse  Communication: Mobility status  Activity Tolerance: Patient tolerated treatment well Patient left: in bed;with bed alarm set;with family/visitor present (in chair position)  OT Visit Diagnosis: Muscle weakness (generalized) (M62.81);Other abnormalities of gait and mobility (R26.89);Cognitive communication deficit (R41.841);Other symptoms and signs involving cognitive function Symptoms and signs involving cognitive functions:  (dementia)                Time: [id number] OT Time Calculation (min): 20 min Charges:  OT General Charges $OT Visit: 1 Visit OT Evaluation $OT Eval Moderate Complexity: [place], OTR/L Acute [employer] [facility] [phone] Office [phone]    Almon Register 01/10/2022, 3:13 PM

## 2022-01-10 NOTE — Progress Notes (Signed)
Patient arrived to room 3W15 in no distress, VS stable, free from pain. Son at bedside. Book, t shirt and phone charger are the only patient belongings.

## 2022-01-10 NOTE — Progress Notes (Signed)
OT Cancellation Note  Patient Details Name: Jaclyn [name] MRN: [id number] DOB: Nov 26, 1935   Cancelled Treatment:    Reason Eval/Treat Not Completed: Other (comment)Pt sound asleep and due to her dementia and admit with AMS will await and try to see her later today instead of waking her from a deep sleep.  [place], OTR/L [facility] [phone] Office [phone]    Almon Register 01/10/2022, 12:11 PM

## 2022-01-10 NOTE — Procedures (Signed)
Patient Name: Jaclyn [name]  MRN: [id number]  Epilepsy Attending: [name]  Referring Physician/Provider: Donnetta Simpers, MD  Date: 01/10/2022 Duration: 23.31 mins  Patient history: 86yo F presented with confusion and not answering questions correctly. EEG to evaluate for seizure  Level of alertness: Awake  AEDs during EEG study: None  Technical aspects: This EEG study was done with scalp electrodes positioned according to the 10-20 International system of electrode placement. Electrical activity was reviewed with band pass filter of 1-'70Hz'$ , sensitivity of 7 uV/mm, display speed of 53m/sec with a '60Hz'$  notched filter applied as appropriate. EEG data were recorded continuously and digitally stored.  Video monitoring was available and reviewed as appropriate.  Description: The posterior dominant rhythm consists of 8 Hz activity of moderate voltage (25-35 uV) seen predominantly in posterior head regions, symmetric and reactive to eye opening and eye closing. EEG showed intermittent generalized and maximal left temporal 3 to 6 Hz theta-delta slowing. Hyperventilation and photic stimulation were not performed.     ABNORMALITY - Intermittent slow, generalized and maximal left temporal region  IMPRESSION: This study is suggestive of cortical dysfunction arising from left temporal region, nonspecific etiology. Additionally there is mild diffuse encephalopathy, nonspecific etiology. No seizures or epileptiform discharges were seen throughout the recording.  Sylvia Kondracki OBarbra Sarks

## 2022-01-10 NOTE — Progress Notes (Signed)
PROGRESS NOTE    Jaclyn [name]  RNH:[id number] DOB: 09-Dec-1935 DOA: 01/09/2022 PCP: Jinny Sanders, MD    Brief Narrative:  86 year old female with history of paroxysmal A-fib, interstitial cystitis on chronic antibiotic therapy, type 2 diabetes on insulin and metformin, hypertension, hypothyroidism, dementia who lives in a long-term nursing home went to son's house for dinner last night and found to have confusion, receptive aphasia and word finding difficulties and brought to the ER by EMS.  Confused but no neurological deficits in the ER. In the emergency room, hemodynamically stable.  Global confusion with no localizing symptoms.  UA was grossly abnormal.  CT scan and MRI were negative for acute findings.  EEG was negative for acute finding.  Admitted due to acute metabolic encephalopathy.   Assessment & Plan:   Acute metabolic encephalopathy secondary to UTI present on admission: Urine culture pending.  Continue Rocephin.  Previous gram-negative bacteria sensitive to ceftriaxone.  She is already on trimethoprim for chronic UTI suppression, however depending upon urine culture reports will change. Patient is mostly bedbound at the nursing home and wearing diapers, probably cause for recurrent UTI. CT head was normal.  MRI brain was without any evidence of acute findings.  EEG without evidence of seizure disorder.  TIA or strokes ruled out.  Type 2 diabetes, uncontrolled with hyperglycemia: Due to missed dose of insulin. Resume insulin and monitor.  Paroxysmal A-fib: Currently rate controlled sinus rhythm.  Therapeutic on Eliquis.  Not on any rate control medications.  Hypothyroidism: On Synthroid.  Dementia without behavioral disturbances/deconditioning and debility: Attempt PT OT today.  If beneficial, will ask for PT OT at nursing home.   DVT prophylaxis: apixaban (ELIQUIS) tablet 2.5 mg Start: 01/10/22 1000 apixaban (ELIQUIS) tablet 2.5 mg   Code Status: Full code Family  Communication: Son at the bedside Disposition Plan: Status is: Inpatient Remains inpatient appropriate because: Encephalopathic, IV antibiotics     Consultants:  Neurology  Procedures:  None  Antimicrobials:  Rocephin 11/23---   Subjective: Patient seen and examined.  Pleasant and interactive with some memory deficits.  She is oriented to place and person.  Not oriented to time and circumstances.  Son at bedside.  No overnight events.  Since last night she has been awake and communicating with her son.  Objective: Vitals:   01/10/22 0400 01/10/22 0417 01/10/22 0700 01/10/22 0806  BP: 122/68  123/63 138/73  Pulse: 92  60 98  Resp: '16  14 17  '$ Temp:  98.5 F (36.9 C)  98.5 F (36.9 C)  TempSrc:    Oral  SpO2: 98%  98% 97%  Weight:      Height:        Intake/Output Summary (Last 24 hours) at 01/10/2022 0823 Last data filed at 01/10/2022 0003 Gross per 24 hour  Intake 97.82 ml  Output --  Net 97.82 ml   Filed Weights   01/09/22 2200 01/09/22 2228  Weight: 59.6 kg 59.6 kg    Examination:  General exam: Appears calm and comfortable  Patient is alert and awake.  She is oriented x2-3.  Moves all extremities equally. Patient has very poor dentition. Respiratory system: Clear to auscultation. Respiratory effort normal. Cardiovascular system: S1 & S2 heard, RRR.  Gastrointestinal system: Abdomen is nondistended, soft and nontender. No organomegaly or masses felt. Normal bowel sounds heard. Skin: No rashes, lesions or ulcers Psychiatry: Judgement and insight appear compromised.    Data Reviewed: I have personally reviewed following labs and imaging studies  CBC:  Recent Labs  Lab 01/09/22 2203 01/09/22 2204 January 22, 2022 0428  WBC 7.0  --  6.1  NEUTROABS 4.2  --   --   HGB 11.8* 11.9* 11.0*  HCT 35.6* 35.0* 33.0*  MCV 89.9  --  88.7  PLT 245  --  440   Basic Metabolic Panel: Recent Labs  Lab 01/09/22 2203 01/09/22 2204  NA 137 137  K 4.3 4.2  CL 101 102   CO2 24  --   GLUCOSE 340* 350*  BUN 12 13  CREATININE 0.98 0.70  CALCIUM 9.3  --    GFR: Estimated Creatinine Clearance: 49 mL/min (by C-G formula based on SCr of 0.7 mg/dL). Liver Function Tests: Recent Labs  Lab 01/09/22 2203  AST 18  ALT 16  ALKPHOS 61  BILITOT 0.6  PROT 6.9  ALBUMIN 3.4*   No results for input(s): "LIPASE", "AMYLASE" in the last 168 hours. Recent Labs  Lab 01/09/22 2241  AMMONIA 13   Coagulation Profile: Recent Labs  Lab 01/09/22 2203  INR 1.2   Cardiac Enzymes: No results for input(s): "CKTOTAL", "CKMB", "CKMBINDEX", "TROPONINI" in the last 168 hours. BNP (last 3 results) No results for input(s): "PROBNP" in the last 8760 hours. HbA1C: No results for input(s): "HGBA1C" in the last 72 hours. CBG: Recent Labs  Lab 01/09/22 2200 01-22-2022 0427 January 22, 2022 0808  GLUCAP 344* 163* 186*   Lipid Profile: Recent Labs    Jan 22, 2022 0428  CHOL 149  HDL 27*  LDLCALC 86  TRIG 178*  CHOLHDL 5.5   Thyroid Function Tests: Recent Labs    01/09/22 2240  TSH 0.014*   Anemia Panel: Recent Labs    01/09/22 2241  VITAMINB12 253  FOLATE 20.6   Sepsis Labs: Recent Labs  Lab 01/09/22 2241 2022-01-22 0158  PROCALCITON <0.10  --   LATICACIDVEN 2.9* 3.1*    No results found for this or any previous visit (from the past 240 hour(s)).       Radiology Studies: EEG adult  Result Date: 2022/01/22 [name], MD     January 22, 2022  8:02 AM Patient Name: Jaclyn [name] MRN: [id number] Epilepsy Attending: [name] Referring Physician/Provider: Donnetta Simpers, MD Date: 01-22-2022 Duration: 23.31 mins Patient history: 86yo F presented with confusion and not answering questions correctly. EEG to evaluate for seizure Level of alertness: Awake AEDs during EEG study: None Technical aspects: This EEG study was done with scalp electrodes positioned according to the 10-20 International system of electrode placement. Electrical activity was  reviewed with band pass filter of 1-'70Hz'$ , sensitivity of 7 uV/mm, display speed of 59m/sec with a '60Hz'$  notched filter applied as appropriate. EEG data were recorded continuously and digitally stored.  Video monitoring was available and reviewed as appropriate. Description: The posterior dominant rhythm consists of 8 Hz activity of moderate voltage (25-35 uV) seen predominantly in posterior head regions, symmetric and reactive to eye opening and eye closing. EEG showed intermittent generalized and maximal left temporal 3 to 6 Hz theta-delta slowing. Hyperventilation and photic stimulation were not performed.   ABNORMALITY - Intermittent slow, generalized and maximal left temporal region IMPRESSION: This study is suggestive of cortical dysfunction arising from left temporal region, nonspecific etiology. Additionally there is mild diffuse encephalopathy, nonspecific etiology. No seizures or epileptiform discharges were seen throughout the recording. PLora Havens  MR BRAIN WO CONTRAST  Result Date: 112/06/23CLINICAL DATA:  Altered mental status, confusion EXAM: MRI HEAD WITHOUT CONTRAST TECHNIQUE: Multiplanar, multiecho pulse sequences of the brain  and surrounding structures were obtained without intravenous contrast. COMPARISON:  02/20/2021 MRI head, correlation is also made with 01/09/2022 CT head FINDINGS: Brain: No restricted diffusion to suggest acute or subacute infarct. No acute hemorrhage, mass, mass effect, or midline shift. Ventriculomegaly, which reflects central parenchymal volume loss is commensurate with sulcal size. No hydrocephalus or extra-axial collection. No hemosiderin deposition to suggest remote hemorrhage. Confluent T2 hyperintense signal in the periventricular white matter and pons, likely the sequela of severe chronic small vessel ischemic disease. Vascular: Normal arterial flow voids. Skull and upper cervical spine: Normal marrow signal. Sinuses/Orbits: Air-fluid level in the right  sphenoid sinus. Status post left lens replacement. Other: None. IMPRESSION: 1. No acute intracranial process. No evidence of acute or subacute infarct. 2. Air-fluid level in the right sphenoid sinus, which is nonspecific but can be seen in the setting of acute sinusitis. Electronically Signed   By: Merilyn Baba M.D.   On: 01/10/2022 00:02   CT HEAD CODE STROKE WO CONTRAST  Result Date: 01/09/2022 CLINICAL DATA:  Code stroke. Altered mental status, aphasia, right-sided facial droop EXAM: CT HEAD WITHOUT CONTRAST TECHNIQUE: Contiguous axial images were obtained from the base of the skull through the vertex without intravenous contrast. RADIATION DOSE REDUCTION: This exam was performed according to the departmental dose-optimization program which includes automated exposure control, adjustment of the mA and/or kV according to patient size and/or use of iterative reconstruction technique. COMPARISON:  11/15/2021 FINDINGS: Brain: No evidence of acute infarction, hemorrhage, cerebral edema, mass, mass effect, or midline shift. No hydrocephalus or extra-axial collection. Periventricular white matter changes, likely the sequela of chronic small vessel ischemic disease. Vascular: No hyperdense vessel. Atherosclerotic calcifications in the intracranial carotid and vertebral arteries. Skull: Negative for fracture or focal lesion. Sinuses/Orbits: Status post left lens replacement. Clear paranasal sinuses. Other: None. ASPECTS [facility] Stroke Program Early CT Score) - Ganglionic level infarction (caudate, lentiform nuclei, internal capsule, insula, M1-M3 cortex): 7 - Supraganglionic infarction (M4-M6 cortex): 3 Total score (0-10 with 10 being normal): 10 IMPRESSION: 1. No acute intracranial process. 2. ASPECTS is 10. Code stroke imaging results were communicated on 01/09/2022 at 10:13 pm to provider Dr. Lorrin Goodell via secure text paging. Electronically Signed   By: Merilyn Baba M.D.   On: 01/09/2022 22:13         Scheduled Meds:  apixaban  2.5 mg Oral BID   DULoxetine  60 mg Oral Daily   insulin aspart  0-15 Units Subcutaneous Q4H   insulin glargine-yfgn  10 Units Subcutaneous QHS   levothyroxine  200 mcg Oral QAC breakfast   Continuous Infusions:  cefTRIAXone (ROCEPHIN)  IV       LOS: 1 day    Time spent: 35 minutes    Barb Merino, MD [facility] Pager [phone]

## 2022-01-10 NOTE — Progress Notes (Signed)
Subjective: Patient has a history of recurrent spells of difficulty speaking.  She had another one earlier today and then while I was speaking to her, she had an episode of perseveration repeating "January"  Exam: Vitals:   01/10/22 0806 01/10/22 1134  BP: 138/73 123/69  Pulse: 98 96  Resp: 17 17  Temp: 98.5 F (36.9 C) 98.6 F (37 C)  SpO2: 97% 96%   Gen: In bed, NAD Resp: non-labored breathing, no acute distress Abd: soft, nt  Neuro: MS: Awake, alert, initially conversant, but then has significant perseveration on the word January, then she seems to have a left gaze preference for a time though she never has a complete behavioral arrest. CN: Pupils equal round and reactive to light, extraocular movements intact Motor: Moves all extremities well Sensory: Intact to light touch   Pertinent Labs: UA is floridly positive  Impression: 86 year old female with recurrent episodes of speech difficulty.  It is possible this is simply a waxing/waning delirium associated with her UTI in the setting of dementia(not previously diagnosed, but discussing with son I strongly suspect).  Given the abrupt changes, however, I would favor ruling out recurrent complex partial seizures as etiology, and therefore I will ask for continuous EEG  Recommendations: 1) EEG overnight 2) continue treating UTI 3) neurology will follow  Roland Rack, MD [facility] [phone]  If 7pm- 7am, please page neurology on call as listed in [place].

## 2022-01-10 NOTE — Progress Notes (Signed)
  Echocardiogram 2D Echocardiogram has been performed.  Jaclyn [name] 01/10/2022, 11:18 AM

## 2022-01-10 NOTE — Evaluation (Signed)
Physical Therapy Evaluation Patient Details Name: Jaclyn [name] MRN: [id number] DOB: 12/19/1935 Today's Date: 01/10/2022  History of Present Illness  86 y.o. female presents to [facility] hospital on 01/09/2022 with AMS, receptive aphasia, word finding difficulties. CT and MRI negative for acute findings. Pt found to have UTI. PMH significant of dementia, paroxysmal A-fib on Eliquis, fibromyalgia, hypertension, poorly controlled type 2 diabetes, hypothyroidism, TIA  Clinical Impression  Pt presents to PT with deficits in cognition, communication, balance, strength, power, functional mobility. At baseline pt is able to transfer with +1/+2 assist to wheelchair per son's report. Today pt requires assist to mobilize in bed and is able to stand, although with strong posterior lean. Pt demonstrates impaired communication during session, perseverating on the word January at times and with difficulty naming later during session. Pt also with brief instances of nonsensical or garbled speech. Neurologist present and aware of symptoms. Pt will benefit from continued acute PT services in an effort to improve mobility quality and to reduce falls risk.       Recommendations for follow up therapy are one component of a multi-disciplinary discharge planning process, led by the attending physician.  Recommendations may be updated based on patient status, additional functional criteria and insurance authorization.  Follow Up Recommendations Skilled nursing-short term rehab (<3 hours/day) Can patient physically be transported by private vehicle: No    Assistance Recommended at Discharge Intermittent Supervision/Assistance  Patient can return home with the following  A lot of help with walking and/or transfers;A lot of help with bathing/dressing/bathroom;Assistance with cooking/housework;Direct supervision/assist for medications management;Direct supervision/assist for financial management;Assist for transportation;Help  with stairs or ramp for entrance    Equipment Recommendations None recommended by PT  Recommendations for Other Services       Functional Status Assessment Patient has had a recent decline in their functional status and demonstrates the ability to make significant improvements in function in a reasonable and predictable amount of time.     Precautions / Restrictions Precautions Precautions: Fall Restrictions Weight Bearing Restrictions: No      Mobility  Bed Mobility Overal bed mobility: Needs Assistance Bed Mobility: Supine to Sit, Sit to Supine     Supine to sit: Min assist, HOB elevated Sit to supine: Max assist        Transfers Overall transfer level: Needs assistance Equipment used: 2 person hand held assist Transfers: Sit to/from Stand Sit to Stand: Mod assist           General transfer comment: posterior lean, pt stands twice with PT    Ambulation/Gait                  Stairs            Wheelchair Mobility    Modified Rankin (Stroke Patients Only)       Balance Overall balance assessment: Needs assistance Sitting-balance support: No upper extremity supported, Feet supported Sitting balance-Leahy Scale: Poor Sitting balance - Comments: intermittent minA Postural control: Posterior lean Standing balance support: Bilateral upper extremity supported, Reliant on assistive device for balance Standing balance-Leahy Scale: Poor                               Pertinent Vitals/Pain Pain Assessment Pain Assessment: No/denies pain    Home Living Family/patient expects to be discharged to:: Skilled nursing facility                   Additional  Comments: [facility] resident, goes to sons home on the weekends    Prior Function Prior Level of Function : Needs assist             Mobility Comments: pt performs stand pivot assist with +1 or +2 assist from staff, dependent for wheelchair mobility ADLs Comments: pt is  able to feed herself, otherwise requires assist for other ADLs     Hand Dominance   Dominant Hand: Right    Extremity/Trunk Assessment   Upper Extremity Assessment Upper Extremity Assessment: Generalized weakness    Lower Extremity Assessment Lower Extremity Assessment: Generalized weakness    Cervical / Trunk Assessment Cervical / Trunk Assessment: Kyphotic  Communication   Communication: HOH  Cognition Arousal/Alertness: Awake/alert Behavior During Therapy: WFL for tasks assessed/performed Overall Cognitive Status: Impaired/Different from baseline Area of Impairment: Orientation, Attention, Memory, Following commands, Safety/judgement, Awareness, Problem solving                 Orientation Level: Disoriented to, Time, Situation Current Attention Level: Focused Memory: Decreased recall of precautions, Decreased short-term memory Following Commands: Follows one step commands with increased time, Follows multi-step commands inconsistently Safety/Judgement: Decreased awareness of safety, Decreased awareness of deficits Awareness: Intellectual Problem Solving: Slow processing, Difficulty sequencing, Requires verbal cues          General Comments General comments (skin integrity, edema, etc.): tachy into low 100s, BP 158/78 after mobilizing. Pt reports dizziness and weakness when sitting at edge of bed, later reports nausea    Exercises     Assessment/Plan    PT Assessment Patient needs continued PT services  PT Problem List Decreased strength;Decreased activity tolerance;Decreased balance;Decreased mobility;Decreased cognition;Decreased coordination;Decreased safety awareness;Decreased knowledge of precautions;Decreased knowledge of use of DME       PT Treatment Interventions DME instruction;Functional mobility training;Therapeutic activities;Therapeutic exercise;Balance training;Neuromuscular re-education;Cognitive remediation;Patient/family education    PT  Goals (Current goals can be found in the Care Plan section)  Acute Rehab PT Goals Patient Stated Goal: to return to baseline, transfer with assistance safely PT Goal Formulation: With family Time For Goal Achievement: 01/24/22 Potential to Achieve Goals: Fair    Frequency Min 2X/week     Co-evaluation               AM-PAC PT "6 Clicks" Mobility  Outcome Measure Help needed turning from your back to your side while in a flat bed without using bedrails?: A Lot Help needed moving from lying on your back to sitting on the side of a flat bed without using bedrails?: A Lot Help needed moving to and from a bed to a chair (including a wheelchair)?: A Lot Help needed standing up from a chair using your arms (e.g., wheelchair or bedside chair)?: A Lot Help needed to walk in hospital room?: Total Help needed climbing 3-5 steps with a railing? : Total 6 Click Score: 10    End of Session   Activity Tolerance: Patient tolerated treatment well Patient left: in bed;with call bell/phone within reach;with bed alarm set;with family/visitor present Nurse Communication: Mobility status PT Visit Diagnosis: Other abnormalities of gait and mobility (R26.89);Muscle weakness (generalized) (M62.81);Other symptoms and signs involving the nervous system (R29.898)    Time: 4008-6761 PT Time Calculation (min) (ACUTE ONLY): 34 min   Charges:   PT Evaluation $PT Eval Low Complexity: [place], PT, DPT Acute Rehabilitation Office [phone]   Zenaida Niece 01/10/2022, 3:53 PM

## 2022-01-10 NOTE — ED Notes (Signed)
ED TO INPATIENT HANDOFF REPORT  ED Nurse Name and Phone #:   S Name/Age/Gender Jaclyn [name] 86 y.o. female Room/Bed: 003C/003C  Code Status   Code Status: Full Code  Home/SNF/Other Home Patient oriented to: self Is this baseline? Yes   Triage Complete: Triage complete  Chief Complaint TIA (transient ischemic attack) [G45.9]  Triage Note Pt LSW 2000 01/09/22. Pt was eating and all of a sudden had trouble feeding herself, had expressive and receptive aphasia and right sided facial droop. Upon arrival pt facial droop resolved. Pt is confused A&OX1 to self. Pt son reports dark urine.   Allergies Allergies  Allergen Reactions   Naproxen Sodium Other (See Comments)    Fever/aches and pains   Statins Other (See Comments)    myalgias   Sulfonamide Derivatives Hives and Itching    Not documented on MAR   Latex Itching and Rash   Shellfish Allergy Itching, Swelling and Rash    Seafood, shrimp    Level of Care/Admitting Diagnosis ED Disposition     ED Disposition  Admit   Condition  --   Comment  Hospital Area: [facility] [[id number]]  Level of Care: Telemetry Medical [[id number]]  May admit patient to [facility] or [facility] if equivalent level of care is available:: Yes  Covid Evaluation: Asymptomatic - no recent exposure (last 10 days) testing not required  Diagnosis: TIA (transient ischemic attack) [638756]  Admitting Physician: Sid Falcon [[id number]]  Attending Physician: Sid Falcon [[id number]]  Certification:: I certify this patient will need inpatient services for at least 2 midnights  Estimated Length of Stay: 3          B Medical/Surgery History Past Medical History:  Diagnosis Date   Atrial fibrillation ([place])    Benign neoplasm of colon    Carotid artery occlusion    60-79% right ICA stenosis   Difficult intubation 02/17/2006   During surgery to remove large polyp   Diverticulosis of colon (without mention of hemorrhage)     Dysthymic disorder    Fibromyalgia    Headache(784.0)    Insomnia, unspecified    Interstitial cystitis    Osteoarthrosis, unspecified whether generalized or localized, unspecified site    Other and unspecified hyperlipidemia    Other chest pain    Other specified benign mammary dysplasias    TIA (transient ischemic attack)    Type II or unspecified type diabetes mellitus without mention of complication, not stated as uncontrolled    Unspecified essential hypertension    Unspecified hypothyroidism    Unspecified vitamin D deficiency    Urinary tract infection, site not specified    Past Surgical History:  Procedure Laterality Date   ABDOMINAL HYSTERECTOMY  1988   cervical dysplasia   CARDIAC CATHETERIZATION  02/19/06   EF 60%   COLONOSCOPY     ENDARTERECTOMY Right 08/26/2019   Procedure: RIGHT CAROTID ENDARTERECTOMY;  Surgeon: Rosetta Posner, MD;  Location: [facility] OR;  Service: Vascular;  Laterality: Right;   PATCH ANGIOPLASTY Right 08/26/2019   Procedure: PATCH ANGIOPLASTY OF RIGHT COMMON CAROTID ARTERY USING HEMASHIELD PLATINUM FINESSE PATCH;  Surgeon: Rosetta Posner, MD;  Location: [facility] OR;  Service: Vascular;  Laterality: Right;   RIGHT COLECTOMY  2005   for villous adenoma of the cecum Dr.streck   VESICOVAGINAL FISTULA CLOSURE W/ TAH  1990   w/ cystocele &retocele repairs Dr. Judith Part IV Location/Drains/Wounds Patient Lines/Drains/Airways Status     Active Line/Drains/Airways  Name Placement date Placement time Site Days   Peripheral IV 01/09/22 20 G Anterior;Distal;Left;Upper Arm 01/09/22  2254  Arm  1   Incision (Closed) 08/26/19 Neck Right 08/26/19  0933  -- 868            Intake/Output Last 24 hours  Intake/Output Summary (Last 24 hours) at 01/10/2022 0609 Last data filed at 01/10/2022 0003 Gross per 24 hour  Intake 97.82 ml  Output --  Net 97.82 ml    Labs/Imaging Results for orders placed or performed during the hospital encounter of 01/09/22 (from the  past 48 hour(s))  CBG monitoring, ED     Status: Abnormal   Collection Time: 01/09/22 10:00 PM  Result Value Ref Range   Glucose-Capillary 344 (H) 70 - 99 mg/dL    Comment: Glucose reference range applies only to samples taken after fasting for at least 8 hours.  Ethanol     Status: None   Collection Time: 01/09/22 10:03 PM  Result Value Ref Range   Alcohol, Ethyl (B) <10 <10 mg/dL    Comment: (NOTE) Lowest detectable limit for serum alcohol is 10 mg/dL.  For medical purposes only. Performed at [facility], [place] [address] [place], [place] [address]   Protime-INR     Status: None   Collection Time: 01/09/22 10:03 PM  Result Value Ref Range   Prothrombin Time 15.1 11.4 - 15.2 seconds   INR 1.2 0.8 - 1.2    Comment: (NOTE) INR goal varies based on device and disease states. Performed at [facility], [place] [address] [place], [place] [address]   APTT     Status: None   Collection Time: 01/09/22 10:03 PM  Result Value Ref Range   aPTT 32 24 - 36 seconds    Comment: Performed at [facility] [address], [place], [place] [address]  CBC     Status: Abnormal   Collection Time: 01/09/22 10:03 PM  Result Value Ref Range   WBC 7.0 4.0 - 10.5 K/uL   RBC 3.96 3.87 - 5.11 MIL/uL   Hemoglobin 11.8 (L) 12.0 - 15.0 g/dL   HCT 35.6 (L) 36.0 - 46.0 %   MCV 89.9 80.0 - 100.0 fL   MCH 29.8 26.0 - 34.0 pg   MCHC 33.1 30.0 - 36.0 g/dL   RDW 13.1 11.5 - 15.5 %   Platelets 245 150 - 400 K/uL   nRBC 0.0 0.0 - 0.2 %    Comment: Performed at [facility], [place] [address], [place], [place] [address]  Differential     Status: None   Collection Time: 01/09/22 10:03 PM  Result Value Ref Range   Neutrophils Relative % 61 %   Neutro Abs 4.2 1.7 - 7.7 K/uL   Lymphocytes Relative 29 %   Lymphs Abs 2.1 0.7 - 4.0 K/uL   Monocytes Relative 9 %   Monocytes Absolute 0.6 0.1 - 1.0 K/uL   Eosinophils Relative 1 %   Eosinophils Absolute 0.1 0.0 - 0.5 K/uL   Basophils  Relative 0 %   Basophils Absolute 0.0 0.0 - 0.1 K/uL   Immature Granulocytes 0 %   Abs Immature Granulocytes 0.02 0.00 - 0.07 K/uL    Comment: Performed at [facility] [address] [place], [place] [address]  Comprehensive metabolic panel     Status: Abnormal   Collection Time: 01/09/22 10:03 PM  Result Value Ref Range   Sodium 137 135 -  145 mmol/L   Potassium 4.3 3.5 - 5.1 mmol/L   Chloride 101 98 - 111 mmol/L   CO2 24 22 - 32 mmol/L   Glucose, Bld 340 (H) 70 - 99 mg/dL    Comment: Glucose reference range applies only to samples taken after fasting for at least 8 hours.   BUN 12 8 - 23 mg/dL   Creatinine, Ser 0.98 0.44 - 1.00 mg/dL   Calcium 9.3 8.9 - 10.3 mg/dL   Total Protein 6.9 6.5 - 8.1 g/dL   Albumin 3.4 (L) 3.5 - 5.0 g/dL   AST 18 15 - 41 U/L   ALT 16 0 - 44 U/L   Alkaline Phosphatase 61 38 - 126 U/L   Total Bilirubin 0.6 0.3 - 1.2 mg/dL   GFR, Estimated 57 (L) >60 mL/min    Comment: (NOTE) Calculated using the CKD-EPI Creatinine Equation (2021)    Anion gap 12 5 - 15    Comment: Performed at [facility] [address] [place], [place] [address]  I-stat chem 8, ED     Status: Abnormal   Collection Time: 01/09/22 10:04 PM  Result Value Ref Range   Sodium 137 135 - 145 mmol/L   Potassium 4.2 3.5 - 5.1 mmol/L   Chloride 102 98 - 111 mmol/L   BUN 13 8 - 23 mg/dL   Creatinine, Ser 0.70 0.44 - 1.00 mg/dL   Glucose, Bld 350 (H) 70 - 99 mg/dL    Comment: Glucose reference range applies only to samples taken after fasting for at least 8 hours.   Calcium, Ion 1.03 (L) 1.15 - 1.40 mmol/L   TCO2 22 22 - 32 mmol/L   Hemoglobin 11.9 (L) 12.0 - 15.0 g/dL   HCT 35.0 (L) 36.0 - 46.0 %  Urine rapid drug screen (hosp performed)     Status: None   Collection Time: 01/09/22 10:20 PM  Result Value Ref Range   Opiates NONE DETECTED NONE DETECTED   Cocaine NONE DETECTED NONE DETECTED   Benzodiazepines NONE DETECTED NONE DETECTED   Amphetamines NONE DETECTED NONE  DETECTED   Tetrahydrocannabinol NONE DETECTED NONE DETECTED   Barbiturates NONE DETECTED NONE DETECTED    Comment: (NOTE) DRUG SCREEN FOR MEDICAL PURPOSES ONLY.  IF CONFIRMATION IS NEEDED FOR ANY PURPOSE, NOTIFY LAB WITHIN 5 DAYS.  LOWEST DETECTABLE LIMITS FOR URINE DRUG SCREEN Drug Class                     Cutoff (ng/mL) Amphetamine and metabolites    1000 Barbiturate and metabolites    200 Benzodiazepine                 200 Opiates and metabolites        300 Cocaine and metabolites        300 THC                            50 Performed at [facility], [place] [address], [place], [place] [address]   Urinalysis, Routine w reflex microscopic Urine, Catheterized     Status: Abnormal   Collection Time: 01/09/22 10:20 PM  Result Value Ref Range   Color, Urine AMBER (A) YELLOW    Comment: BIOCHEMICALS MAY BE AFFECTED BY COLOR   APPearance CLOUDY (A) CLEAR   Specific Gravity, Urine 1.023 1.005 - 1.030   pH 5.0 5.0 - 8.0   Glucose, UA 150 (A) NEGATIVE  mg/dL   Hgb urine dipstick NEGATIVE NEGATIVE   Bilirubin Urine NEGATIVE NEGATIVE   Ketones, ur NEGATIVE NEGATIVE mg/dL   Protein, ur NEGATIVE NEGATIVE mg/dL   Nitrite POSITIVE (A) NEGATIVE   Leukocytes,Ua MODERATE (A) NEGATIVE   RBC / HPF 0-5 0 - 5 RBC/hpf   WBC, UA 21-50 0 - 5 WBC/hpf   Bacteria, UA MANY (A) NONE SEEN   Squamous Epithelial / LPF 0-5 0 - 5    Comment: Performed at [facility], [place] [address], [place], [place] [address]  TSH     Status: Abnormal   Collection Time: 01/09/22 10:40 PM  Result Value Ref Range   TSH 0.014 (L) 0.350 - 4.500 uIU/mL    Comment: Performed by a 3rd Generation assay with a functional sensitivity of <=0.01 uIU/mL. Performed at [facility], [place] [address] [place], [place] [address]   Vitamin B12     Status: None   Collection Time: 01/09/22 10:41 PM  Result Value Ref Range   Vitamin B-12 253 180 - 914 pg/mL    Comment: (NOTE) This assay is not validated for testing  neonatal or myeloproliferative syndrome specimens for Vitamin B12 levels. Performed at [facility], [place] [address], [place], [place] [address]   Folate     Status: None   Collection Time: 01/09/22 10:41 PM  Result Value Ref Range   Folate 20.6 >5.9 ng/mL    Comment: RESULT CONFIRMED BY MANUAL DILUTION Performed at [facility], [place] [address], [place], [place] [address]   Ammonia     Status: None   Collection Time: 01/09/22 10:41 PM  Result Value Ref Range   Ammonia 13 9 - 35 umol/L    Comment: Performed at [facility], [place] [address] [place], [place] [address]  Procalcitonin - Baseline     Status: None   Collection Time: 01/09/22 10:41 PM  Result Value Ref Range   Procalcitonin <0.10 ng/mL    Comment:        Interpretation: PCT (Procalcitonin) <= 0.5 ng/mL: Systemic infection (sepsis) is not likely. Local bacterial infection is possible. (NOTE)       Sepsis PCT Algorithm           Lower Respiratory Tract                                      Infection PCT Algorithm    ----------------------------     ----------------------------         PCT < 0.25 ng/mL                PCT < 0.10 ng/mL          Strongly encourage             Strongly discourage   discontinuation of antibiotics    initiation of antibiotics    ----------------------------     -----------------------------       PCT 0.25 - 0.50 ng/mL            PCT 0.10 - 0.25 ng/mL               OR       >80% decrease in PCT            Discourage initiation of  antibiotics      Encourage discontinuation           of antibiotics    ----------------------------     -----------------------------         PCT >= 0.50 ng/mL              PCT 0.26 - 0.50 ng/mL               AND        <80% decrease in PCT             Encourage initiation of                                             antibiotics       Encourage continuation           of antibiotics     ----------------------------     -----------------------------        PCT >= 0.50 ng/mL                  PCT > 0.50 ng/mL               AND         increase in PCT                  Strongly encourage                                      initiation of antibiotics    Strongly encourage escalation           of antibiotics                                     -----------------------------                                           PCT <= 0.25 ng/mL                                                 OR                                        > 80% decrease in PCT                                      Discontinue / Do not initiate                                             antibiotics  Performed at [facility], [place] [address], [place], [place] [address]   Lactic acid, plasma     Status: Abnormal  Collection Time: 01/09/22 10:41 PM  Result Value Ref Range   Lactic Acid, Venous 2.9 (HH) 0.5 - 1.9 mmol/L    Comment: CRITICAL RESULT CALLED TO, READ BACK BY AND VERIFIED WITH Loa Socks RN 01/09/22 2340 M KOROLESKI Performed at [facility], [place] [address], [place], [place] [address]   Lactic acid, plasma     Status: Abnormal   Collection Time: 01/10/22  1:58 AM  Result Value Ref Range   Lactic Acid, Venous 3.1 (HH) 0.5 - 1.9 mmol/L    Comment: CRITICAL VALUE NOTED. VALUE IS CONSISTENT WITH PREVIOUSLY REPORTED/CALLED VALUE Performed at [facility], [place] [address], [place], [place] [address]   CBG monitoring, ED     Status: Abnormal   Collection Time: 01/10/22  4:27 AM  Result Value Ref Range   Glucose-Capillary 163 (H) 70 - 99 mg/dL    Comment: Glucose reference range applies only to samples taken after fasting for at least 8 hours.  Lipid panel     Status: Abnormal   Collection Time: 01/10/22  4:28 AM  Result Value Ref Range   Cholesterol 149 0 - 200 mg/dL   Triglycerides 178 (H) <150 mg/dL   HDL 27 (L) >40 mg/dL   Total CHOL/HDL Ratio 5.5 RATIO   VLDL 36 0 - 40 mg/dL    LDL Cholesterol 86 0 - 99 mg/dL    Comment:        Total Cholesterol/HDL:CHD Risk Coronary Heart Disease Risk Table                     Men   Women  1/2 Average Risk   3.4   3.3  Average Risk       5.0   4.4  2 X Average Risk   9.6   7.1  3 X Average Risk  23.4   11.0        Use the calculated Patient Ratio above and the CHD Risk Table to determine the patient's CHD Risk.        ATP III CLASSIFICATION (LDL):  <100     mg/dL   Optimal  100-129  mg/dL   Near or Above                    Optimal  130-159  mg/dL   Borderline  160-189  mg/dL   High  >190     mg/dL   Very High Performed at [facility] [address] [place], [place] [address]   CBC     Status: Abnormal   Collection Time: 01/10/22  4:28 AM  Result Value Ref Range   WBC 6.1 4.0 - 10.5 K/uL   RBC 3.72 (L) 3.87 - 5.11 MIL/uL   Hemoglobin 11.0 (L) 12.0 - 15.0 g/dL   HCT 33.0 (L) 36.0 - 46.0 %   MCV 88.7 80.0 - 100.0 fL   MCH 29.6 26.0 - 34.0 pg   MCHC 33.3 30.0 - 36.0 g/dL   RDW 13.2 11.5 - 15.5 %   Platelets 151 150 - 400 K/uL    Comment: REPEATED TO VERIFY   nRBC 0.0 0.0 - 0.2 %    Comment: Performed at [facility], [place] [address], [place], [place] [address]   MR BRAIN WO CONTRAST  Result Date: 01/10/2022 CLINICAL DATA:  Altered mental status, confusion EXAM: MRI HEAD WITHOUT CONTRAST TECHNIQUE: Multiplanar, multiecho pulse sequences of the brain and surrounding structures were  obtained without intravenous contrast. COMPARISON:  02/20/2021 MRI head, correlation is also made with 01/09/2022 CT head FINDINGS: Brain: No restricted diffusion to suggest acute or subacute infarct. No acute hemorrhage, mass, mass effect, or midline shift. Ventriculomegaly, which reflects central parenchymal volume loss is commensurate with sulcal size. No hydrocephalus or extra-axial collection. No hemosiderin deposition to suggest remote hemorrhage. Confluent T2 hyperintense signal in the periventricular white matter and  pons, likely the sequela of severe chronic small vessel ischemic disease. Vascular: Normal arterial flow voids. Skull and upper cervical spine: Normal marrow signal. Sinuses/Orbits: Air-fluid level in the right sphenoid sinus. Status post left lens replacement. Other: None. IMPRESSION: 1. No acute intracranial process. No evidence of acute or subacute infarct. 2. Air-fluid level in the right sphenoid sinus, which is nonspecific but can be seen in the setting of acute sinusitis. Electronically Signed   By: Merilyn Baba M.D.   On: 01/10/2022 00:02   CT HEAD CODE STROKE WO CONTRAST  Result Date: 01/09/2022 CLINICAL DATA:  Code stroke. Altered mental status, aphasia, right-sided facial droop EXAM: CT HEAD WITHOUT CONTRAST TECHNIQUE: Contiguous axial images were obtained from the base of the skull through the vertex without intravenous contrast. RADIATION DOSE REDUCTION: This exam was performed according to the departmental dose-optimization program which includes automated exposure control, adjustment of the mA and/or kV according to patient size and/or use of iterative reconstruction technique. COMPARISON:  11/15/2021 FINDINGS: Brain: No evidence of acute infarction, hemorrhage, cerebral edema, mass, mass effect, or midline shift. No hydrocephalus or extra-axial collection. Periventricular white matter changes, likely the sequela of chronic small vessel ischemic disease. Vascular: No hyperdense vessel. Atherosclerotic calcifications in the intracranial carotid and vertebral arteries. Skull: Negative for fracture or focal lesion. Sinuses/Orbits: Status post left lens replacement. Clear paranasal sinuses. Other: None. ASPECTS [facility] Stroke Program Early CT Score) - Ganglionic level infarction (caudate, lentiform nuclei, internal capsule, insula, M1-M3 cortex): 7 - Supraganglionic infarction (M4-M6 cortex): 3 Total score (0-10 with 10 being normal): 10 IMPRESSION: 1. No acute intracranial process. 2. ASPECTS is  10. Code stroke imaging results were communicated on 01/09/2022 at 10:13 pm to provider Dr. Lorrin Goodell via secure text paging. Electronically Signed   By: Merilyn Baba M.D.   On: 01/09/2022 22:13    Pending Labs Unresulted Labs (From admission, onward)     Start     Ordered   01/10/22 6433  Basic metabolic panel  Once,   STAT        01/10/22 0600   01/09/22 2331  Urine Culture  (Urine Culture)  Add-on,   AD       Question:  Indication  Answer:  Dysuria   01/09/22 2330            Vitals/Pain Today's Vitals   01/10/22 0200 01/10/22 0230 01/10/22 0400 01/10/22 0417  BP: 125/62 129/76 122/68   Pulse: 84  92   Resp: 20 (!) 23 16   Temp:    98.5 F (36.9 C)  TempSrc:      SpO2: 98%  98%   Weight:      Height:      PainSc:        Isolation Precautions No active isolations  Medications Medications  DULoxetine (CYMBALTA) DR capsule 60 mg (has no administration in time range)  insulin glargine-yfgn (SEMGLEE) injection 10 Units (has no administration in time range)  levothyroxine (SYNTHROID) tablet 200 mcg (has no administration in time range)  apixaban (ELIQUIS) tablet 2.5 mg (has no administration  in time range)  insulin aspart (novoLOG) injection 0-15 Units (3 Units Subcutaneous Given 01/10/22 0432)  acetaminophen (TYLENOL) tablet 650 mg (has no administration in time range)    Or  acetaminophen (TYLENOL) suppository 650 mg (has no administration in time range)  ondansetron (ZOFRAN) tablet 4 mg (has no administration in time range)    Or  ondansetron (ZOFRAN) injection 4 mg (has no administration in time range)  cefTRIAXone (ROCEPHIN) 1 g in sodium chloride 0.9 % 100 mL IVPB (has no administration in time range)  insulin aspart (novoLOG) injection 10 Units (10 Units Subcutaneous Given 01/09/22 2255)  cefTRIAXone (ROCEPHIN) 2 g in sodium chloride 0.9 % 100 mL IVPB (0 g Intravenous Stopped 01/10/22 0003)    Mobility non-ambulatory High fall risk   Focused  Assessments Neuro Assessment Handoff:  Swallow screen pass? Yes  Cardiac Rhythm: Normal sinus rhythm NIH Stroke Scale ( + Modified Stroke Scale Criteria)  Interval: Shift assessment Level of Consciousness (1a.)   : Alert, keenly responsive LOC Questions (1b. )   +: Answers both questions correctly LOC Commands (1c. )   + : Performs both tasks correctly Best Gaze (2. )  +: Normal Visual (3. )  +: No visual loss Facial Palsy (4. )    : Normal symmetrical movements Motor Arm, Left (5a. )   +: No drift Motor Arm, Right (5b. )   +: No drift Motor Leg, Left (6a. )   +: No drift Motor Leg, Right (6b. )   +: No drift Limb Ataxia (7. ): Absent Sensory (8. )   +: Normal, no sensory loss Best Language (9. )   +: No aphasia Dysarthria (10. ): Normal Extinction/Inattention (11.)   +: No Abnormality Modified SS Total  +: 0 Complete NIHSS TOTAL: 0 Last date known well: 01/09/22 Last time known well: 2000 Neuro Assessment: Within Defined Limits Neuro Checks:   Initial (01/09/22 2205)  Last Documented NIHSS Modified Score: 0 (01/10/22 0003) Has TPA been given? No If patient is a Neuro Trauma and patient is going to OR before floor call report to [place] nurse: [phone] or [phone]   R Recommendations: See Admitting Provider Note  Report given to:   Additional Notes:

## 2022-01-10 NOTE — Progress Notes (Signed)
EEG complete - results pending 

## 2022-01-10 NOTE — Progress Notes (Signed)
LTM EEG hooked up and running - no initial skin breakdown - push button tested - [facility] monitoring.  

## 2022-01-11 LAB — GLUCOSE, CAPILLARY
Glucose-Capillary: 180 mg/dL — ABNORMAL HIGH (ref 70–99)
Glucose-Capillary: 137 mg/dL — ABNORMAL HIGH (ref 70–99)
Glucose-Capillary: 272 mg/dL — ABNORMAL HIGH (ref 70–99)
Glucose-Capillary: 262 mg/dL — ABNORMAL HIGH (ref 70–99)

## 2022-01-11 LAB — T4, FREE: Free T4: 1.67 ng/dL — ABNORMAL HIGH (ref 0.61–1.12)

## 2022-01-11 MED ORDER — VITAMIN B-12 1000 MCG PO TABS
2000.0000 ug | ORAL_TABLET | Freq: Every day | ORAL | Status: DC
  Administered 2022-01-11 – 2022-01-13 (×3): 2000 ug via ORAL
  Filled 2022-01-11 (×3): qty 2

## 2022-01-11 NOTE — Procedures (Signed)
Patient Name: Temeca Somma  MRN: [id number]  Epilepsy Attending: [name]  Referring Physician/Provider: Greta Doom, MD  Duration: 01/10/2022 1605 to 01/11/2022 1605   Patient history: 86yo F presented with confusion and not answering questions correctly. EEG to evaluate for seizure   Level of alertness: Awake, asleep   AEDs during EEG study: None   Technical aspects: This EEG study was done with scalp electrodes positioned according to the 10-20 International system of electrode placement. Electrical activity was reviewed with band pass filter of 1-'70Hz'$ , sensitivity of 7 uV/mm, display speed of 52m/sec with a '60Hz'$  notched filter applied as appropriate. EEG data were recorded continuously and digitally stored.  Video monitoring was available and reviewed as appropriate.   Description: The posterior dominant rhythm consists of 8 Hz activity of moderate voltage (25-35 uV) seen predominantly in posterior head regions, symmetric and reactive to eye opening and eye closing. Sleep was characterized by sleep spindles (12-'14hz'$ ), maximal fronto-central region. EEG showed intermittent generalized and maximal left temporal 3 to 6 Hz theta-delta slowing. Hyperventilation and photic stimulation were not performed.      ABNORMALITY - Intermittent slow, generalized and maximal left temporal region   IMPRESSION: This study is suggestive of cortical dysfunction arising from left temporal region, nonspecific etiology. Additionally there is mild diffuse encephalopathy, nonspecific etiology. No seizures or epileptiform discharges were seen throughout the recording.   Kenetra Hildenbrand OBarbra Sarks

## 2022-01-11 NOTE — NC FL2 (Signed)
[place] LEVEL OF CARE SCREENING TOOL     IDENTIFICATION  Patient Name: Jaclyn [name] Birthdate: [birth year] Sex: female Admission Date (Current Location): 01/09/2022  [facility] and [place] Number:  Herbalist and Address:  [facility] [place], [place] [address], [place], [place] [address]      Provider Number: [id number]  Attending Physician Name and Address:  Barb Merino, MD  Relative Name and Phone Number:       Current Level of Care: Hospital Recommended Level of Care: [place] Prior Approval Number:    Date Approved/Denied:   PASRR Number: [id number]  Discharge Plan: SNF    Current Diagnoses: Patient Active Problem List   Diagnosis Date Noted   Hypomagnesemia 11/15/2021   Depression 11/15/2021   Incontinence 02/17/2021   Ataxia 02/17/2021   UTI (urinary tract infection) 02/16/2021   Failure to thrive in adult 02/16/2021   Mixed stress and urge urinary incontinence 02/05/2021   Yeast infection 12/03/2020   Hypokalemia    Labile blood glucose    Uncontrolled type 2 diabetes mellitus with hyperglycemia (HCC)    Orthostatic hypotension    Elevated blood pressure reading    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   Recurrent UTI    Dyslipidemia    Orthostasis    Dysarthria    Vagina, candidiasis 03/10/2019   Dementia ([place]) 03/10/2019   Acute respiratory failure with hypoxia ([place]) 64/33/2951   Acute metabolic encephalopathy 88/41/6606   COVID-19 virus infection 01/15/2019   Acute lower UTI 01/15/2019   B12 deficiency 01/22/2018   History of CVA (cerebrovascular accident) 06/20/2017   Stenosis of right carotid artery    Peripheral edema 07/29/2016   Imbalance 04/07/2016   Intertriginous candidiasis 04/26/2015   Hard of hearing 04/12/2015   TIA (transient ischemic attack) 04/10/2015   History of recurrent UTIs 04/10/2015   Dysuria 03/09/2015   Tremor 05/11/2014   Leg weakness,  bilateral 05/11/2014   Fatty liver 08/04/2013   Personal history of colonic polyps 10/05/2012   Esophageal reflux 10/05/2012   Carotid stenosis, symptomatic, with infarction ([place]) 10/14/2010   MDD (major depressive disorder), single episode, moderate ([place]) 01/13/2008   Essential hypertension, benign 01/13/2008   PAROXYSMAL ATRIAL FIBRILLATION 01/13/2008   Intracranial vascular stenosis 01/13/2008   DIVERTICULOSIS OF COLON 01/13/2008   Fibromyalgia 01/13/2008   CHEST PAIN, ATYPICAL 01/13/2008   Hypothyroidism 04/13/2007   Type 2 diabetes mellitus ([place]) 04/13/2007   Vitamin D deficiency 04/13/2007   Hyperlipidemia LDL goal <70 04/13/2007   DEGENERATIVE JOINT DISEASE 04/13/2007    Orientation RESPIRATION BLADDER Height & Weight     Self  Normal Continent, External catheter Weight: 131 lb 6.3 oz (59.6 kg) Height:  '5\' 6"'$  (301.6 cm)  BEHAVIORAL SYMPTOMS/MOOD NEUROLOGICAL BOWEL NUTRITION STATUS      Continent Diet (See DC Summary)  AMBULATORY STATUS COMMUNICATION OF NEEDS Skin   Extensive Assist Verbally Normal                       Personal Care Assistance Level of Assistance  Bathing, Feeding, Dressing Bathing Assistance: Maximum assistance Feeding assistance: Limited assistance Dressing Assistance: Limited assistance     Functional Limitations Info  Sight, Hearing Sight Info: Impaired Hearing Info: Impaired      SPECIAL CARE FACTORS FREQUENCY                       Contractures Contractures Info: Not present  Additional Factors Info  Code Status, Allergies, Psychotropic, Insulin Sliding Scale Code Status Info: Full Allergies Info: Naproxen Sodium, Statins, Sulfonamide Derivatives, Latex, Shellfish Allergy Psychotropic Info: Cymbalta Insulin Sliding Scale Info: see dc summary       Current Medications (01/11/2022):  This is the current hospital active medication list Current Facility-Administered Medications  Medication Dose Route Frequency Provider  Last Rate Last Admin    stroke: early stages of recovery book   Does not apply Once Gilles Chiquito B, MD       acetaminophen (TYLENOL) tablet 650 mg  650 mg Oral Q6H PRN Sid Falcon, MD       Or   acetaminophen (TYLENOL) suppository 650 mg  650 mg Rectal Q6H PRN Sid Falcon, MD       apixaban Arne [place]) tablet 2.5 mg  2.5 mg Oral BID Gilles Chiquito B, MD   2.5 mg at 01/10/22 2151   cefTRIAXone (ROCEPHIN) 1 g in sodium chloride 0.9 % 100 mL IVPB  1 g Intravenous Q24H Gilles Chiquito B, MD 200 mL/hr at 01/10/22 2209 1 g at 01/10/22 2209   DULoxetine (CYMBALTA) DR capsule 60 mg  60 mg Oral Daily Gilles Chiquito B, MD   60 mg at 01/10/22 0828   insulin aspart (novoLOG) injection 0-15 Units  0-15 Units Subcutaneous TID WC Barb Merino, MD   3 Units at 01/11/22 0751   insulin aspart (novoLOG) injection 0-5 Units  0-5 Units Subcutaneous QHS Barb Merino, MD   3 Units at 01/10/22 2150   insulin glargine-yfgn (SEMGLEE) injection 10 Units  10 Units Subcutaneous QHS Sid Falcon, MD   10 Units at 01/10/22 2335   levothyroxine (SYNTHROID) tablet 200 mcg  200 mcg Oral QAC breakfast Gilles Chiquito B, MD   200 mcg at 01/11/22 0752   ondansetron (ZOFRAN) tablet 4 mg  4 mg Oral Q6H PRN Sid Falcon, MD       Or   ondansetron [facility]) injection 4 mg  4 mg Intravenous Q6H PRN Sid Falcon, MD         Discharge Medications: Please see discharge summary for a list of discharge medications.  Relevant Imaging Results:  Relevant Lab Results:   Additional Information SSN: [id number].  Benard Halsted, LCSW

## 2022-01-11 NOTE — Progress Notes (Signed)
PROGRESS NOTE    Jaclyn [name]  WUJ:[id number] DOB: 1936/01/30 DOA: 01/09/2022 PCP: Jaclyn Sanders, MD    Brief Narrative:  86 year old female with history of paroxysmal A-fib, interstitial cystitis on chronic antibiotic therapy, type 2 diabetes on insulin and metformin, hypertension, hypothyroidism, dementia who lives in a long-term nursing home went to son's house for dinner last night and found to have confusion, receptive aphasia and word finding difficulties and brought to the ER by EMS.  Confused but no neurological deficits in the ER. In the emergency room, hemodynamically stable.  Global confusion with no localizing symptoms.  UA was grossly abnormal.  CT scan and MRI were negative for acute findings.  EEG was negative for acute finding.  Admitted due to acute metabolic encephalopathy. Patient continued to have spells of gibberish speech and currently remains on long-term EEG.   Assessment & Plan:   Acute metabolic encephalopathy secondary to UTI present on admission: Urinalysis was abnormal.  Apparently urine culture was not collected yet.  Redo urine culture.  Continue Rocephin.  Previous gram-negative bacteria sensitive to ceftriaxone.  She is already on trimethoprim for chronic UTI suppression, however depending upon urine culture reports will change. Patient is mostly bedbound at the nursing home and wearing diapers, probably cause for recurrent UTI. CT head was normal.  MRI brain was without any evidence of acute findings.  EEG without evidence of seizure disorder.  TIA or strokes ruled out. Remains persistently and intermittently encephalopathic, currently followed by neurology and getting long-term EEG.  Type 2 diabetes, uncontrolled with hyperglycemia: Due to missed dose of insulin. Better after insulin resumption.  Paroxysmal A-fib: Currently rate controlled sinus rhythm.  Therapeutic on Eliquis.  Not on any rate control medications.  Hypothyroidism: On  Synthroid.  Dementia without behavioral disturbances/deconditioning and debility: Continue to attempt PT OT.   DVT prophylaxis: apixaban (ELIQUIS) tablet 2.5 mg Start: 01/10/22 1000 apixaban (ELIQUIS) tablet 2.5 mg   Code Status: Full code Family Communication: Son at the bedside Disposition Plan: Status is: Inpatient Remains inpatient appropriate because: Encephalopathic, IV antibiotics     Consultants:  Neurology  Procedures:  None  Antimicrobials:  Rocephin 11/23---   Subjective: Patient seen and examined.  She had multiple episodes of confusion and that were intermittent.  Currently getting EEG.  Son at the bedside.  Overnight no other events.  Patient's son was very sad because she even did not recognize him at times.  Objective: Vitals:   01/10/22 2346 01/11/22 0425 01/11/22 0750 01/11/22 1118  BP: (!) 136/56 128/60 132/64 (!) 126/58  Pulse: 89 90 89 86  Resp: '18 18 17 17  '$ Temp: 98.6 F (37 C) 98.1 F (36.7 C) 98.5 F (36.9 C) 98.5 F (36.9 C)  TempSrc: Axillary Axillary Oral Oral  SpO2: 98% 97% 99% 97%  Weight:      Height:        Intake/Output Summary (Last 24 hours) at 01/11/2022 1347 Last data filed at 01/11/2022 9562 Gross per 24 hour  Intake 240 ml  Output 400 ml  Net -160 ml    Filed Weights   01/09/22 2200 01/09/22 2228  Weight: 59.6 kg 59.6 kg    Examination:  General exam: Appears calm and comfortable.  Sleepy now.  Difficult to awaken keep up conversation.  Moves all extremities. Respiratory system: Clear to auscultation. Respiratory effort normal. Cardiovascular system: S1 & S2 heard, RRR.  Gastrointestinal system: Abdomen is nondistended, soft and nontender. No organomegaly or masses felt. Normal bowel sounds heard.  Skin: No rashes, lesions or ulcers Psychiatry: Judgement and insight appear compromised.    Data Reviewed: I have personally reviewed following labs and imaging studies  CBC: Recent Labs  Lab 01/09/22 2203  01/09/22 2204 01/10/22 0428  WBC 7.0  --  6.1  NEUTROABS 4.2  --   --   HGB 11.8* 11.9* 11.0*  HCT 35.6* 35.0* 33.0*  MCV 89.9  --  88.7  PLT 245  --  245    Basic Metabolic Panel: Recent Labs  Lab 01/09/22 2203 01/09/22 2204 01/10/22 0941  NA 137 137 137  K 4.3 4.2 4.5  CL 101 102 104  CO2 24  --  20*  GLUCOSE 340* 350* 236*  BUN '12 13 12  '$ CREATININE 0.98 0.70 0.80  CALCIUM 9.3  --  9.0    GFR: Estimated Creatinine Clearance: 49 mL/min (by C-G formula based on SCr of 0.8 mg/dL). Liver Function Tests: Recent Labs  Lab 01/09/22 2203  AST 18  ALT 16  ALKPHOS 61  BILITOT 0.6  PROT 6.9  ALBUMIN 3.4*    No results for input(s): "LIPASE", "AMYLASE" in the last 168 hours. Recent Labs  Lab 01/09/22 2241  AMMONIA 13    Coagulation Profile: Recent Labs  Lab 01/09/22 2203  INR 1.2    Cardiac Enzymes: No results for input(s): "CKTOTAL", "CKMB", "CKMBINDEX", "TROPONINI" in the last 168 hours. BNP (last 3 results) No results for input(s): "PROBNP" in the last 8760 hours. HbA1C: No results for input(s): "HGBA1C" in the last 72 hours. CBG: Recent Labs  Lab 01/10/22 1136 01/10/22 1658 01/10/22 2038 01/11/22 0626 01/11/22 1120  GLUCAP 196* 359* 296* 180* 137*    Lipid Profile: Recent Labs    01/10/22 0428  CHOL 149  HDL 27*  LDLCALC 86  TRIG 178*  CHOLHDL 5.5    Thyroid Function Tests: Recent Labs    01/09/22 2240  TSH 0.014*    Anemia Panel: Recent Labs    01/09/22 2241  VITAMINB12 253  FOLATE 20.6    Sepsis Labs: Recent Labs  Lab 01/09/22 2241 01/10/22 0158  PROCALCITON <0.10  --   LATICACIDVEN 2.9* 3.1*     No results found for this or any previous visit (from the past 240 hour(s)).       Radiology Studies: Overnight EEG with video  Result Date: 01/11/2022 Jaclyn Havens, MD     01/11/2022 10:13 AM Patient Name: Jaclyn [name] MRN: [id number] Epilepsy Attending: Lora [name] Referring Physician/Provider:  Greta Doom, MD Duration: 01/10/2022 1605 to 01/11/2022 1000  Patient history: 86yo F presented with confusion and not answering questions correctly. EEG to evaluate for seizure  Level of alertness: Awake, asleep  AEDs during EEG study: None  Technical aspects: This EEG study was done with scalp electrodes positioned according to the 10-20 International system of electrode placement. Electrical activity was reviewed with band pass filter of 1-'70Hz'$ , sensitivity of 7 uV/mm, display speed of 60m/sec with a '60Hz'$  notched filter applied as appropriate. EEG data were recorded continuously and digitally stored.  Video monitoring was available and reviewed as appropriate.  Description: The posterior dominant rhythm consists of 8 Hz activity of moderate voltage (25-35 uV) seen predominantly in posterior head regions, symmetric and reactive to eye opening and eye closing. Sleep was characterized by sleep spindles (12-'14hz'$ ), maximal fronto-central region. EEG showed intermittent generalized and maximal left temporal 3 to 6 Hz theta-delta slowing. Hyperventilation and photic stimulation were not performed.    ABNORMALITY -  Intermittent slow, generalized and maximal left temporal region  IMPRESSION: This study is suggestive of cortical dysfunction arising from left temporal region, nonspecific etiology. Additionally there is mild diffuse encephalopathy, nonspecific etiology. No seizures or epileptiform discharges were seen throughout the recording.  Jaclyn [name]   ECHOCARDIOGRAM COMPLETE  Result Date: 01/10/2022    ECHOCARDIOGRAM REPORT   Patient Name:   EILEENE KISLING Date of Exam: 01/10/2022 Medical Rec #:  [id number]       Height:       66.0 in Accession #:    [id number]      Weight:       131.4 lb Date of Birth:  December 23, 1935       BSA:          1.673 m Patient Age:    55 years        BP:           138/73 mmHg Patient Gender: F               HR:           103 bpm. Exam Location:  Inpatient Procedure: 2D  Echo Indications:    TIA  History:        Patient has prior history of Echocardiogram examinations, most                 recent 08/22/2019. Arrythmias:Paroxysmal atrial fibrillation; Risk                 Factors:Diabetes and Hypertension.  Sonographer:    Johny Chess RDCS Referring Phys: [id number] [place] Comments: Image acquisition challenging due to uncooperative patient. IMPRESSIONS  1. Left ventricular ejection fraction, by estimation, is 65 to 70%. The left ventricle has normal function. The left ventricle has no regional wall motion abnormalities. There is mild concentric left ventricular hypertrophy. Left ventricular diastolic parameters are consistent with Grade I diastolic dysfunction (impaired relaxation).  2. Right ventricular systolic function is hyperdynamic. The right ventricular size is normal. Tricuspid regurgitation signal is inadequate for assessing PA pressure.  3. A small pericardial effusion is present. The pericardial effusion is anterior to the right ventricle. There is no evidence of cardiac tamponade.  4. The mitral valve was not well visualized. No evidence of mitral valve regurgitation. No evidence of mitral stenosis.  5. The aortic valve was not well visualized. Aortic valve regurgitation is not visualized. Aortic valve sclerosis is present, with no evidence of aortic valve stenosis.  6. The inferior vena cava is normal in size with greater than 50% respiratory variability, suggesting right atrial pressure of 3 mmHg. Comparison(s): No significant change from prior study. FINDINGS  Left Ventricle: Left ventricular ejection fraction, by estimation, is 65 to 70%. The left ventricle has normal function. The left ventricle has no regional wall motion abnormalities. The left ventricular internal cavity size was small. There is mild concentric left ventricular hypertrophy. Left ventricular diastolic parameters are consistent with Grade I diastolic dysfunction (impaired  relaxation). Right Ventricle: The right ventricular size is normal. Right vetricular wall thickness was not well visualized. Right ventricular systolic function is hyperdynamic. Tricuspid regurgitation signal is inadequate for assessing PA pressure. Left Atrium: Left atrial size was normal in size. Right Atrium: Right atrial size was normal in size. Pericardium: A small pericardial effusion is present. The pericardial effusion is anterior to the right ventricle. There is no evidence of cardiac tamponade. Presence of epicardial fat layer. Mitral Valve: The mitral valve was not  well visualized. Mild mitral annular calcification. No evidence of mitral valve regurgitation. No evidence of mitral valve stenosis. Tricuspid Valve: The tricuspid valve is normal in structure. Tricuspid valve regurgitation is not demonstrated. No evidence of tricuspid stenosis. Aortic Valve: The aortic valve was not well visualized. Aortic valve regurgitation is not visualized. Aortic valve sclerosis is present, with no evidence of aortic valve stenosis. Pulmonic Valve: The pulmonic valve was not well visualized. Pulmonic valve regurgitation is not visualized. No evidence of pulmonic stenosis. Aorta: The aortic root and ascending aorta are structurally normal, with no evidence of dilitation. Venous: The inferior vena cava is normal in size with greater than 50% respiratory variability, suggesting right atrial pressure of 3 mmHg. IAS/Shunts: No atrial level shunt detected by color flow Doppler.  LEFT VENTRICLE PLAX 2D LVIDd:         3.20 cm   Diastology LVIDs:         2.10 cm   LV e' medial:    5.00 cm/s LV PW:         1.00 cm   LV E/e' medial:  12.7 LV IVS:        0.90 cm   LV e' lateral:   5.77 cm/s LVOT diam:     1.70 cm   LV E/e' lateral: 11.0 LVOT Area:     2.27 cm  RIGHT VENTRICLE             IVC RV S prime:     16.30 cm/s  IVC diam: 1.20 cm TAPSE (M-mode): 0.5 cm LEFT ATRIUM             Index LA diam:        2.80 cm 1.67 cm/m LA Vol  (A2C):   19.3 ml 11.54 ml/m LA Vol (A4C):   26.9 ml 16.08 ml/m LA Biplane Vol: 24.1 ml 14.41 ml/m   AORTA Ao Root diam: 2.70 cm Ao Asc diam:  2.80 cm MITRAL VALVE MV Area (PHT): 5.38 cm     SHUNTS MV Decel Time: 141 msec     Systemic Diam: 1.70 cm MV E velocity: 63.60 cm/s MV A velocity: 103.00 cm/s MV E/A ratio:  0.62 Rudean Haskell MD Electronically signed by Rudean Haskell MD Signature Date/Time: 01/10/2022/12:18:48 PM    Final    EEG adult  Result Date: 01/10/2022 Jaclyn Havens, MD     01/10/2022  8:02 AM Patient Name: Kaeya Schiffer MRN: [id number] Epilepsy Attending: Lora [name] Referring Physician/Provider: Donnetta Simpers, MD Date: 01/10/2022 Duration: 23.31 mins Patient history: 86yo F presented with confusion and not answering questions correctly. EEG to evaluate for seizure Level of alertness: Awake AEDs during EEG study: None Technical aspects: This EEG study was done with scalp electrodes positioned according to the 10-20 International system of electrode placement. Electrical activity was reviewed with band pass filter of 1-'70Hz'$ , sensitivity of 7 uV/mm, display speed of 68m/sec with a '60Hz'$  notched filter applied as appropriate. EEG data were recorded continuously and digitally stored.  Video monitoring was available and reviewed as appropriate. Description: The posterior dominant rhythm consists of 8 Hz activity of moderate voltage (25-35 uV) seen predominantly in posterior head regions, symmetric and reactive to eye opening and eye closing. EEG showed intermittent generalized and maximal left temporal 3 to 6 Hz theta-delta slowing. Hyperventilation and photic stimulation were not performed.   ABNORMALITY - Intermittent slow, generalized and maximal left temporal region IMPRESSION: This study is suggestive of cortical dysfunction arising from left temporal region, nonspecific  etiology. Additionally there is mild diffuse encephalopathy, nonspecific etiology. No seizures  or epileptiform discharges were seen throughout the recording. Jaclyn [name]   MR BRAIN WO CONTRAST  Result Date: 01/10/2022 CLINICAL DATA:  Altered mental status, confusion EXAM: MRI HEAD WITHOUT CONTRAST TECHNIQUE: Multiplanar, multiecho pulse sequences of the brain and surrounding structures were obtained without intravenous contrast. COMPARISON:  02/20/2021 MRI head, correlation is also made with 01/09/2022 CT head FINDINGS: Brain: No restricted diffusion to suggest acute or subacute infarct. No acute hemorrhage, mass, mass effect, or midline shift. Ventriculomegaly, which reflects central parenchymal volume loss is commensurate with sulcal size. No hydrocephalus or extra-axial collection. No hemosiderin deposition to suggest remote hemorrhage. Confluent T2 hyperintense signal in the periventricular white matter and pons, likely the sequela of severe chronic small vessel ischemic disease. Vascular: Normal arterial flow voids. Skull and upper cervical spine: Normal marrow signal. Sinuses/Orbits: Air-fluid level in the right sphenoid sinus. Status post left lens replacement. Other: None. IMPRESSION: 1. No acute intracranial process. No evidence of acute or subacute infarct. 2. Air-fluid level in the right sphenoid sinus, which is nonspecific but can be seen in the setting of acute sinusitis. Electronically Signed   By: Merilyn Baba M.D.   On: 01/10/2022 00:02   CT HEAD CODE STROKE WO CONTRAST  Result Date: 01/09/2022 CLINICAL DATA:  Code stroke. Altered mental status, aphasia, right-sided facial droop EXAM: CT HEAD WITHOUT CONTRAST TECHNIQUE: Contiguous axial images were obtained from the base of the skull through the vertex without intravenous contrast. RADIATION DOSE REDUCTION: This exam was performed according to the departmental dose-optimization program which includes automated exposure control, adjustment of the mA and/or kV according to patient size and/or use of iterative reconstruction  technique. COMPARISON:  11/15/2021 FINDINGS: Brain: No evidence of acute infarction, hemorrhage, cerebral edema, mass, mass effect, or midline shift. No hydrocephalus or extra-axial collection. Periventricular white matter changes, likely the sequela of chronic small vessel ischemic disease. Vascular: No hyperdense vessel. Atherosclerotic calcifications in the intracranial carotid and vertebral arteries. Skull: Negative for fracture or focal lesion. Sinuses/Orbits: Status post left lens replacement. Clear paranasal sinuses. Other: None. ASPECTS [facility] Stroke Program Early CT Score) - Ganglionic level infarction (caudate, lentiform nuclei, internal capsule, insula, M1-M3 cortex): 7 - Supraganglionic infarction (M4-M6 cortex): 3 Total score (0-10 with 10 being normal): 10 IMPRESSION: 1. No acute intracranial process. 2. ASPECTS is 10. Code stroke imaging results were communicated on 01/09/2022 at 10:13 pm to provider Dr. Lorrin Goodell via secure text paging. Electronically Signed   By: Merilyn Baba M.D.   On: 01/09/2022 22:13        Scheduled Meds:   stroke: early stages of recovery book   Does not apply Once   apixaban  2.5 mg Oral BID   DULoxetine  60 mg Oral Daily   insulin aspart  0-15 Units Subcutaneous TID WC   insulin aspart  0-5 Units Subcutaneous QHS   insulin glargine-yfgn  10 Units Subcutaneous QHS   levothyroxine  200 mcg Oral QAC breakfast   Continuous Infusions:  cefTRIAXone (ROCEPHIN)  IV 1 g (01/10/22 2209)     LOS: 2 days    Time spent: 35 minutes    Barb Merino, MD [facility] Pager [phone]

## 2022-01-11 NOTE — TOC Initial Note (Signed)
Transition of Care [facility]) - Initial/Assessment Note    Patient Details  Name: Jaclyn [name] MRN: [id number] Date of Birth: 05-26-1935  Transition of Care [facility]) CM/SW Contact:    Benard Halsted, LCSW Phone Number: 01/11/2022, 10:28 AM  Clinical Narrative:                 CSW spoke with patient's son. He confirmed patient is an ltc resident at [facility] and will return at discharge. Will require PTAR for transport. CSW confirmed plan with [employer]; no insurance authorization needed.   Expected Discharge Plan: Skilled Nursing Facility Barriers to Discharge: Continued Medical Work up   Patient Goals and CMS Choice Patient states their goals for this hospitalization and ongoing recovery are:: Return to SNF CMS Medicare.gov Compare Post Acute Care list provided to:: Patient Represenative (must comment) Choice offered to / list presented to : Adult Children  Expected Discharge Plan and Services Expected Discharge Plan: [place] In-house Referral: Clinical Social Work   Post Acute Care Choice: [place] Living arrangements for the past 2 months: [place]                                      Prior Living Arrangements/Services Living arrangements for the past 2 months: [place] Lives with:: Facility Resident Patient language and need for interpreter reviewed:: Yes Do you feel safe going back to the place where you live?: Yes      Need for Family Participation in Patient Care: Yes (Comment) Care giver support system in place?: Yes (comment)   Criminal Activity/Legal Involvement Pertinent to Current Situation/Hospitalization: No - Comment as needed  Activities of Daily Living Home Assistive Devices/Equipment: Wheelchair ADL Screening (condition at time of admission) Patient's cognitive ability adequate to safely complete daily activities?: No Is the patient deaf or have difficulty hearing?: Yes Does the  patient have difficulty seeing, even when wearing glasses/contacts?: Yes Does the patient have difficulty concentrating, remembering, or making decisions?: Yes Patient able to express need for assistance with ADLs?: Yes Does the patient have difficulty dressing or bathing?: Yes Independently performs ADLs?: No Communication: Needs assistance Is this a change from baseline?: Pre-admission baseline Dressing (OT): Needs assistance Is this a change from baseline?: Pre-admission baseline Grooming: Needs assistance Is this a change from baseline?: Pre-admission baseline Feeding: Needs assistance Is this a change from baseline?: Pre-admission baseline Bathing: Needs assistance Is this a change from baseline?: Pre-admission baseline Toileting: Needs assistance Is this a change from baseline?: Pre-admission baseline In/Out Bed: Needs assistance Is this a change from baseline?: Pre-admission baseline Walks in Home: Needs assistance Is this a change from baseline?: Pre-admission baseline Does the patient have difficulty walking or climbing stairs?: Yes Weakness of Legs: Both Weakness of Arms/Hands: Both  Permission Sought/Granted Permission sought to share information with : Facility Sport and exercise psychologist, Family Supports Permission granted to share information with : No  Share Information with NAME: [name]  Permission granted to share info w AGENCY: [place] granted to share info w Relationship: Son  Permission granted to share info w Contact Information: [phone]  Emotional Assessment Appearance:: Appears stated age Attitude/Demeanor/Rapport: Unable to Assess Affect (typically observed): Unable to Assess Orientation: : Oriented to Self Alcohol / Substance Use: Not Applicable Psych Involvement: No (comment)  Admission diagnosis:  Aphasia [R47.01] TIA (transient ischemic attack) [G45.9] Hyperglycemia [R73.9] Acute cystitis without hematuria [N30.00]  Acute  encephalopathy [G93.40] Patient Active Problem List   Diagnosis Date Noted   Hypomagnesemia 11/15/2021   Depression 11/15/2021   Incontinence 02/17/2021   Ataxia 02/17/2021   UTI (urinary tract infection) 02/16/2021   Failure to thrive in adult 02/16/2021   Mixed stress and urge urinary incontinence 02/05/2021   Yeast infection 12/03/2020   Hypokalemia    Labile blood glucose    Uncontrolled type 2 diabetes mellitus with hyperglycemia (HCC)    Orthostatic hypotension    Elevated blood pressure reading    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   Recurrent UTI    Dyslipidemia    Orthostasis    Dysarthria    Vagina, candidiasis 03/10/2019   Dementia ([place]) 03/10/2019   Acute respiratory failure with hypoxia ([place]) 86/38/1771   Acute metabolic encephalopathy 16/57/9038   COVID-19 virus infection 01/15/2019   Acute lower UTI 01/15/2019   B12 deficiency 01/22/2018   History of CVA (cerebrovascular accident) 06/20/2017   Stenosis of right carotid artery    Peripheral edema 07/29/2016   Imbalance 04/07/2016   Intertriginous candidiasis 04/26/2015   Hard of hearing 04/12/2015   TIA (transient ischemic attack) 04/10/2015   History of recurrent UTIs 04/10/2015   Dysuria 03/09/2015   Tremor 05/11/2014   Leg weakness, bilateral 05/11/2014   Fatty liver 08/04/2013   Personal history of colonic polyps 10/05/2012   Esophageal reflux 10/05/2012   Carotid stenosis, symptomatic, with infarction ([place]) 10/14/2010   MDD (major depressive disorder), single episode, moderate ([place]) 01/13/2008   Essential hypertension, benign 01/13/2008   PAROXYSMAL ATRIAL FIBRILLATION 01/13/2008   Intracranial vascular stenosis 01/13/2008   DIVERTICULOSIS OF COLON 01/13/2008   Fibromyalgia 01/13/2008   CHEST PAIN, ATYPICAL 01/13/2008   Hypothyroidism 04/13/2007   Type 2 diabetes mellitus ([place]) 04/13/2007   Vitamin D deficiency 04/13/2007   Hyperlipidemia LDL goal <70 04/13/2007   DEGENERATIVE  JOINT DISEASE 04/13/2007   PCP:  Jinny Sanders, MD Pharmacy:   [place], [place] - [address] [place] [address] Phone: [phone] Fax: [phone]     Social Determinants of Health (SDOH) Interventions    Readmission Risk Interventions     No data to display

## 2022-01-11 NOTE — Progress Notes (Signed)
Neurology Progress Note  Interval History: Episode yesterday of perseverating "January" to all questions.  No further episodes overnight.  Son at bedside, pt sleeping.  She would respond to me but was agitated I was waking her up.  She was easily arousable but hard to get to follow commands, and I feel more likely to being agitated.  Son also notes she is very hard of hearing so I needed to almost yell for her to hear me.   Exam: Vitals:   01/11/22 0425 01/11/22 0750  BP: 128/60 132/64  Pulse: 90 89  Resp: 18 17  Temp: 98.1 F (36.7 C) 98.5 F (36.9 C)  SpO2: 97% 99%   Gen: In bed, NAD Resp: non-labored breathing, no acute distress Cardiovascular: Normal rate and regular rhythm.  Abd: soft, nt  Neuro: pt very drowsy and agitated, hard to get to participate in exam Mental Status: Patient is asleep but arouses to pain stim, oriented to person, place, would not answer other questions, getting agitated when trying to wake her Cranial Nerves: II: Visual Fields are full. Pupils are equal, round, and reactive to light.   III,IV, VI: EOMI without ptosis or diploplia.  V: Facial sensation unable to test, pt would not participate, was agitated VII: Facial movement is symmetric resting and smiling VIII: Hearing is intact to voice X: Palate elevates symmetrically XI: Shoulder shrug is symmetric. XII: Tongue protrudes:  unable to test, pt would not participate, was agitated Motor: Tone is normal. Bulk is normal. 5/5 strength was present in all four extremities.  Sensory: Sensation is symmetric to noxious stimuli. No extinction to DSS present.  Deep Tendon Reflexes: 2+ and symmetric in the biceps and patellae.  Plantars: Toes are downgoing bilaterally.  Cerebellar: FNF and HKS: pt would not participate  ______________________________________________________________  Pertinent Labs: UA (+) Glucose chronically elevated but improving, A1C 344 01/09/22 -> 180  12/2021 Elevated lactic  acid 11/23 at 2.9 Ammonia normal 13 Folate normal 20.6 B12 low normal 253 TSH low 0.014 (no Free T4)  CT Head 01/09/22: unremarkable  MRI Brain w/o 01/10/22: severe vascular disease, Fazekas 3/3, ex vacuo ventricular dilatation, possible NPH depending on symptom and onset  EEG, Routine 01/10/22: suggestive of cortical dysfunction arising from left temporal region, nonspecific etiology. Additionally there is mild diffuse encephalopathy, nonspecific etiology. No seizures or epileptiform discharges were seen throughout the recording.   vEEG 01/10/2022 no epileptic activity as of 11/25 at 10am  _______________________________________________________________  Impression:  86 year old female with recurrent episodes of speech difficulty.  It is possible this is simply a waxing/waning delirium associated with her UTI in the setting of dementia (not previously diagnosed, but discussing with son I strongly suspect).  Episode with son was in the evening.  Possible Given the abrupt changes, however, I would favor ruling out recurrent complex partial seizures as etiology.  Son notes she has been wheelchair dependent since 01/2021 ED visit due to septic UTI, thyroid issues.  He notes she has had some improvement since then but never back to baseline.  Wheelchair dependent at home.  She has had recurrent UTI's which seem to worsen all symptoms.  Still a concern for seizures causing confusional episodes of ataxia and speech changes, will continue vEEG.  She has been very drowsy today so unsure if she has had any episodes or if this is possibly post-ictal fatigue.   MRI Brain showing sever vascular disease Fazekas 3/3 which could signal an emerging/progressing Vascular dementia with sundowning (per discussion with son) triggered  by septic recurrent UTI.    Recommendations: Start B12 supplement 2,088mg/d Check Free T4 Continue vEEG another 24hrs, still suspecting possible  seizures.  ________________________________________  Patient seen and examined by NP with MD. MD to update note as needed.   MDellia Beckwith ANashNeurology Hospitalist  To contact Stroke Continuity provider, please refer to [email or web address] After hours, contact General Neurology  I have seen the patient, when I saw her, she was awake, eating lunch, not having much difficulty speaking.  Advised her son to push the button for any times where she has increased difficulty speaking.  Neurology will continue to follow.  MRoland Rack MD [facility] [phone] If 7pm- 7am, please page neurology on call as listed in ANorco

## 2022-01-12 LAB — GLUCOSE, CAPILLARY
Glucose-Capillary: 309 mg/dL — ABNORMAL HIGH (ref 70–99)
Glucose-Capillary: 245 mg/dL — ABNORMAL HIGH (ref 70–99)
Glucose-Capillary: 267 mg/dL — ABNORMAL HIGH (ref 70–99)
Glucose-Capillary: 342 mg/dL — ABNORMAL HIGH (ref 70–99)

## 2022-01-12 MED ORDER — LEVOTHYROXINE SODIUM 75 MCG PO TABS
150.0000 ug | ORAL_TABLET | Freq: Every day | ORAL | Status: DC
  Administered 2022-01-13: 150 ug via ORAL
  Filled 2022-01-12: qty 2

## 2022-01-12 NOTE — Progress Notes (Signed)
LTM EEG discontinued - no skin breakdown at unhook.   

## 2022-01-12 NOTE — Progress Notes (Signed)
PROGRESS NOTE    Jaclyn [name]  YOV:[id number] DOB: Feb 08, 1936 DOA: 01/09/2022 PCP: Jaclyn Sanders, MD    Brief Narrative:  86 year old female with history of paroxysmal A-fib, interstitial cystitis on chronic antibiotic therapy, type 2 diabetes on insulin and metformin, hypertension, hypothyroidism, dementia who lives in a long-term nursing home went to son's house for thanks giving dinner and found to have confusion, receptive aphasia and word finding difficulties and brought to the ER by EMS.  Confused but no neurological deficits in the ER. In the emergency room, hemodynamically stable.  Global confusion with no localizing symptoms.  UA was grossly abnormal.  CT scan and MRI were negative for acute findings.  EEG was negative for acute finding.  Admitted due to acute metabolic encephalopathy. Patient continued to have spells of gibberish speech and currently remains on long-term EEG.   Assessment & Plan:   Acute metabolic encephalopathy secondary to UTI present on admission: Urinalysis was abnormal.  Apparently urine culture was not collected yet.  Redo urine culture.  Continue Rocephin.  Previous gram-negative bacteria sensitive to ceftriaxone.  She is already on trimethoprim for chronic UTI suppression, however depending upon urine culture reports will change. Patient is mostly bedbound at the nursing home and wearing diapers, probably cause for recurrent UTI. CT head was normal.  MRI brain was without any evidence of acute findings.  EEG without evidence of seizure disorder.  TIA or strokes ruled out. Remains persistently and intermittently encephalopathic, currently followed by neurology and getting long-term EEG. No evidence of seizure yet.  Type 2 diabetes, uncontrolled with hyperglycemia: Due to missed dose of insulin. Better after insulin resumption.  Continue similar dose today.  Paroxysmal A-fib: Currently rate controlled sinus rhythm.  Therapeutic on Eliquis.  Not on any  rate control medications.  Hypothyroidism: TSH is suppressed.  Decrease dose of thyroxine from 200 mcg to 150 mcg today.  Dementia without behavioral disturbances/deconditioning and debility: Continue to attempt PT OT.   DVT prophylaxis: apixaban (ELIQUIS) tablet 2.5 mg Start: 01/10/22 1000 apixaban (ELIQUIS) tablet 2.5 mg   Code Status: Full code Family Communication: Son at the bedside Disposition Plan: Status is: Inpatient Remains inpatient appropriate because: Encephalopathic, IV antibiotics     Consultants:  Neurology  Procedures:  None  Antimicrobials:  Rocephin 11/23---   Subjective:  Patient was seen and examined.  Son at the bedside.  Today she is more awake but is still confused.  She is interactive and laughing.  Follows simple commands.  No overnight events.   Events noted by family at middle of the night apparently without evidence of seizure.  Objective: Vitals:   01/11/22 2036 01/11/22 2350 01/12/22 0410 01/12/22 0936  BP: 125/87 (!) 140/59 130/66 128/64  Pulse: 87 81 88 85  Resp: '16 18 16 16  '$ Temp: 98 F (36.7 C) 98.3 F (36.8 C) 97.8 F (36.6 C) 97.6 F (36.4 C)  TempSrc: Oral Oral Oral Oral  SpO2: 97% 98% 98% 98%  Weight:      Height:        Intake/Output Summary (Last 24 hours) at 01/12/2022 1148 Last data filed at 01/11/2022 2222 Gross per 24 hour  Intake 150 ml  Output --  Net 150 ml   Filed Weights   01/09/22 2200 01/09/22 2228  Weight: 59.6 kg 59.6 kg    Examination:  General exam: Appears calm and comfortable.  Alert and interactive but not oriented.  Moves all extremities. Respiratory system: Clear to auscultation. Respiratory effort normal. Cardiovascular  system: S1 & S2 heard, RRR.  Gastrointestinal system: Abdomen is nondistended, soft and nontender. No organomegaly or masses felt. Normal bowel sounds heard. Skin: No rashes, lesions or ulcers Psychiatry: Judgement and insight appear compromised.    Data Reviewed: I  have personally reviewed following labs and imaging studies  CBC: Recent Labs  Lab 01/09/22 2203 01/09/22 2204 01/10/22 0428  WBC 7.0  --  6.1  NEUTROABS 4.2  --   --   HGB 11.8* 11.9* 11.0*  HCT 35.6* 35.0* 33.0*  MCV 89.9  --  88.7  PLT 245  --  308   Basic Metabolic Panel: Recent Labs  Lab 01/09/22 2203 01/09/22 2204 01/10/22 0941  NA 137 137 137  K 4.3 4.2 4.5  CL 101 102 104  CO2 24  --  20*  GLUCOSE 340* 350* 236*  BUN '12 13 12  '$ CREATININE 0.98 0.70 0.80  CALCIUM 9.3  --  9.0   GFR: Estimated Creatinine Clearance: 49 mL/min (by C-G formula based on SCr of 0.8 mg/dL). Liver Function Tests: Recent Labs  Lab 01/09/22 2203  AST 18  ALT 16  ALKPHOS 61  BILITOT 0.6  PROT 6.9  ALBUMIN 3.4*   No results for input(s): "LIPASE", "AMYLASE" in the last 168 hours. Recent Labs  Lab 01/09/22 2241  AMMONIA 13   Coagulation Profile: Recent Labs  Lab 01/09/22 2203  INR 1.2   Cardiac Enzymes: No results for input(s): "CKTOTAL", "CKMB", "CKMBINDEX", "TROPONINI" in the last 168 hours. BNP (last 3 results) No results for input(s): "PROBNP" in the last 8760 hours. HbA1C: No results for input(s): "HGBA1C" in the last 72 hours. CBG: Recent Labs  Lab 01/11/22 0626 01/11/22 1120 01/11/22 1635 01/11/22 2118 01/12/22 0628  GLUCAP 180* 137* 272* 262* 309*   Lipid Profile: Recent Labs    01/10/22 0428  CHOL 149  HDL 27*  LDLCALC 86  TRIG 178*  CHOLHDL 5.5   Thyroid Function Tests: Recent Labs    01/09/22 2240 01/11/22 1601  TSH 0.014*  --   FREET4  --  1.67*   Anemia Panel: Recent Labs    01/09/22 2241  VITAMINB12 253  FOLATE 20.6   Sepsis Labs: Recent Labs  Lab 01/09/22 2241 01/10/22 0158  PROCALCITON <0.10  --   LATICACIDVEN 2.9* 3.1*    No results found for this or any previous visit (from the past 240 hour(s)).       Radiology Studies: Overnight EEG with video  Result Date: 01/11/2022 [name], MD     01/12/2022   9:14 AM Patient Name: Jaclyn [name] MRN: [id number] Epilepsy Attending: [name] Referring Physician/Provider: Greta Doom, MD Duration: 01/10/2022 1605 to 01/11/2022 1605  Patient history: 86yo F presented with confusion and not answering questions correctly. EEG to evaluate for seizure  Level of alertness: Awake, asleep  AEDs during EEG study: None  Technical aspects: This EEG study was done with scalp electrodes positioned according to the 10-20 International system of electrode placement. Electrical activity was reviewed with band pass filter of 1-'70Hz'$ , sensitivity of 7 uV/mm, display speed of 39m/sec with a '60Hz'$  notched filter applied as appropriate. EEG data were recorded continuously and digitally stored.  Video monitoring was available and reviewed as appropriate.  Description: The posterior dominant rhythm consists of 8 Hz activity of moderate voltage (25-35 uV) seen predominantly in posterior head regions, symmetric and reactive to eye opening and eye closing. Sleep was characterized by sleep spindles (12-'14hz'$ ), maximal fronto-central region.  EEG showed intermittent generalized and maximal left temporal 3 to 6 Hz theta-delta slowing. Hyperventilation and photic stimulation were not performed.    ABNORMALITY - Intermittent slow, generalized and maximal left temporal region  IMPRESSION: This study is suggestive of cortical dysfunction arising from left temporal region, nonspecific etiology. Additionally there is mild diffuse encephalopathy, nonspecific etiology. No seizures or epileptiform discharges were seen throughout the recording.  Jaclyn [name]        Scheduled Meds:   stroke: early stages of recovery book   Does not apply Once   apixaban  2.5 mg Oral BID   vitamin B-12  2,000 mcg Oral Daily   DULoxetine  60 mg Oral Daily   insulin aspart  0-15 Units Subcutaneous TID WC   insulin aspart  0-5 Units Subcutaneous QHS   insulin glargine-yfgn  10 Units Subcutaneous QHS    [START ON 01/13/2022] levothyroxine  150 mcg Oral QAC breakfast   Continuous Infusions:  cefTRIAXone (ROCEPHIN)  IV 1 g (01/11/22 2217)     LOS: 3 days    Time spent: 35 minutes    Barb Merino, MD [facility] Pager [phone]

## 2022-01-12 NOTE — Procedures (Addendum)
Patient Name: Jaclyn [name]  MRN: [id number]  Epilepsy Attending: [name]  Referring Physician/Provider: Greta Doom, MD  Duration: 01/11/2022 1605 to 01/12/2022 1605   Patient history: 86yo F presented with confusion and not answering questions correctly. EEG to evaluate for seizure   Level of alertness: Awake, asleep   AEDs during EEG study: None   Technical aspects: This EEG study was done with scalp electrodes positioned according to the 10-20 International system of electrode placement. Electrical activity was reviewed with band pass filter of 1-'70Hz'$ , sensitivity of 7 uV/mm, display speed of 55m/sec with a '60Hz'$  notched filter applied as appropriate. EEG data were recorded continuously and digitally stored.  Video monitoring was available and reviewed as appropriate.   Description: The posterior dominant rhythm consists of 8 Hz activity of moderate voltage (25-35 uV) seen predominantly in posterior head regions, symmetric and reactive to eye opening and eye closing. Sleep was characterized by sleep spindles (12-'14hz'$ ), maximal fronto-central region. EEG showed intermittent generalized and maximal left temporal 3 to 6 Hz theta-delta slowing. Hyperventilation and photic stimulation were not performed.     Event button was pressed on 01/12/2022 at 0103. Per family patient was "sleep talking but awake". Concomitant eeg before, during and after the event didn't show any eeg change to suggest seizure.   ABNORMALITY - Intermittent slow, generalized and maximal left temporal region   IMPRESSION: This study is suggestive of cortical dysfunction arising from left temporal region, nonspecific etiology. Additionally there is mild diffuse encephalopathy, nonspecific etiology. No seizures or epileptiform discharges were seen throughout the recording.  Event button was pressed on 01/12/2022 at 0103. Per family patient was "sleep talking but awake" without concomitant eeg change.  This was not an epileptic event.    Juanda Luba OBarbra Sarks

## 2022-01-12 NOTE — Progress Notes (Signed)
Neurology Progress Note  Interval History: Son at bedside.  Pt alert and in good spirits.  Able to follow commands.  Only oriented to self.  Just ate half her breakfast.  Yesterday I reviewed brain imaging and severity of vascular disease and dementia progression with son 11/25.  Discussed thyroid being off and her med compliance.  Recommended he reach out to her facility to make sure they are watching her take her pills.  He said he has found some pills on her floor and other odd places in the past.  Likely the cause of the thyroid abnormalities.  Awaiting to see overnight EEG  Exam: Vitals:   01/11/22 2350 01/12/22 0410  BP: (!) 140/59 130/66  Pulse: 81 88  Resp: 18 16  Temp: 98.3 F (36.8 C) 97.8 F (36.6 C)  SpO2: 98% 98%   Gen: In bed, NAD Resp: non-labored breathing, no acute distress Cardiovascular: Normal rate and regular rhythm.  Abd: soft, nt  Neuro:  Mental Status: Patient is alert, no distress, oriented to self only Cranial Nerves: II: Visual Fields are full. Pupils are equal, round, and reactive to light.   III,IV, VI: EOMI without ptosis or diploplia.  V: Facial sensation symmetric  -VII: Facial movement is symmetric resting and smiling VIII: Hearing is intact to voice- X: Palate elevates symmetrically XI: Shoulder shrug is symmetric. XII: Tongue protrudes midline Motor: Tone is normal. Bulk is normal. 5/5 strength was present in all four extremities.  Sensory: Sensation is symmetric upper and lower extremities. No extinction to DSS present.  Deep Tendon Reflexes: 2+ and symmetric in the biceps and patellae.  Plantars: Toes are downgoing bilaterally.  Cerebellar: FNF and HKS normal  ______________________________________________________________  Pertinent Labs: UA (+) 01/09/22 UA test of cure awaiting results Glucose chronically elevated but improving, A1C 344 01/09/22 -> 180  12/2021 Elevated lactic acid 11/23 at 2.9 Ammonia normal 13 Folate  normal 20.6 B12 low normal 253 (started supplement 11/25) TSH low 0.014, Free T4 elevated 1.67 (notified attending team)  CT Head 01/09/22: unremarkable  MRI Brain w/o 01/10/22: severe vascular disease, [place] 3/3, ex vacuo ventricular dilatation  EEG, Routine 01/10/22: suggestive of cortical dysfunction arising from left temporal region, nonspecific etiology. Additionally there is mild diffuse encephalopathy, nonspecific etiology. No seizures or epileptiform discharges were seen throughout the recording.   vEEG 11/25 at 10am: no epileptic activity, awaiting report from overnight  _______________________________________________________________  Impression:  86 year old female with recurrent episodes of speech difficulty.  It is possible this is simply a waxing/waning delirium associated with her UTI in the setting of dementia (not previously diagnosed, but discussing with son I strongly suspect).  Episode with son was in the evening.  Possible Given the abrupt changes, however, I would favor ruling out recurrent complex partial seizures as etiology.  Son notes she has been wheelchair dependent since 01/2021 ED visit due to septic UTI, thyroid issues.  He notes she has had some improvement since then but never back to baseline.  Wheelchair dependent at home.  She has had recurrent UTI's which seem to worsen all symptoms.  Recommendations: continue B12 supplement 2,064mg/d Can stop continuous EEG at this point TSH low 0.014, Free T4 elevated 1.67, notified primary attending Follow up outpatient neurology to help manage dementia to prevent future ED visits  ________________________________________  Patient seen and examined by NP with MD. MD to update note as needed.   MDellia Beckwith ABeverly HillsNeurology Hospitalist [phone]  To contact Stroke Continuity provider, please refer to  [email or web address] After hours, contact General Neurology.If 7pm- 7am, please page neurology on call as  listed in [place].  I have seen the patient and reviewed the above note.  Her spells have improved.  There is still a possibility that they represent seizure, but they could also be representing a waxing/waning delirium.  If they continue to be an issue, empiric treatment could be attempted, however with 48 hours of negative LTM EEG, I would not start an antiepileptic empirically at this time.  No further inpatient recommendations, would treat her UTI and provide supportive care for her delirium, neurology will be available on an as-needed basis.  Roland Rack, MD [facility] [phone]  If 7pm- 7am, please page neurology on call as listed in [place].

## 2022-01-13 DIAGNOSIS — E538 Deficiency of other specified B group vitamins: Secondary | ICD-10-CM

## 2022-01-13 DIAGNOSIS — N390 Urinary tract infection, site not specified: Principal | ICD-10-CM

## 2022-01-13 LAB — GLUCOSE, CAPILLARY
Glucose-Capillary: 245 mg/dL — ABNORMAL HIGH (ref 70–99)
Glucose-Capillary: 316 mg/dL — ABNORMAL HIGH (ref 70–99)
Glucose-Capillary: 325 mg/dL — ABNORMAL HIGH (ref 70–99)

## 2022-01-13 LAB — URINE CULTURE

## 2022-01-13 MED ORDER — CYANOCOBALAMIN 2000 MCG PO TABS: 2000.0000 ug | ORAL_TABLET | Freq: Every day | ORAL | 0 refills | Status: AC

## 2022-01-13 MED ORDER — CEPHALEXIN 250 MG PO CAPS: 250.0000 mg | ORAL_CAPSULE | Freq: Every day | ORAL | 2 refills | Status: DC

## 2022-01-13 MED ORDER — LEVOTHYROXINE SODIUM 200 MCG PO TABS: 200.0000 ug | ORAL_TABLET | Freq: Every day | ORAL | 0 refills | Status: DC

## 2022-01-13 NOTE — Discharge Summary (Signed)
Physician Discharge Summary  Eleyna Brugh ZOX:[id number] DOB: Dec 30, 1935 DOA: 01/09/2022  PCP: Jinny Sanders, MD  Admit date: 01/09/2022 Discharge date: 01/13/2022  Admitted From: Long-term nursing care Disposition: Home with home nursing home  Recommendations for Outpatient Follow-up:  Follow up with PCP in 1-2 weeks   Discharge Condition: Stable CODE STATUS: Full code Diet recommendation: Low-salt and low-carb diet  Discharge summary: 86 year old female with history of paroxysmal A-fib, interstitial cystitis on chronic antibiotic therapy, type 2 diabetes on insulin and metformin, hypertension, hypothyroidism, dementia who lives in a long-term nursing home went to son's house for thanksgiving dinner and found to have confusion, receptive aphasia and word finding difficulties and brought to the ER by EMS.  Confused but no neurological deficits in the ER. In the emergency room, hemodynamically stable.  Global confusion with no localizing symptoms.  UA was grossly abnormal.  CT scan and MRI were negative for acute findings.  EEG was negative for acute finding.  Admitted due to acute metabolic encephalopathy. Patient continued to have spells of gibberish speech and was evaluated for presence of any epileptiform discharges, however there was no definite evidence of subclinical seizures.  Mental status is improving.     Assessment & plan of care:   Acute metabolic encephalopathy secondary to UTI present on admission: Urinalysis was abnormal.  Apparently urine culture was not collected on admission.  Urine culture after starting antibiotics were negative.  Rocephin day 4 today.  Previous gram-negative bacteria sensitive to ceftriaxone.  She is already on trimethoprim for chronic UTI suppression, her previous urine culture is resistant to trimethoprim.  Change antibiotic therapy to chronic suppressive therapy with Keflex 250 mg daily at bedtime. Patient is mostly bedbound at the nursing home  and wearing diapers, probably cause for recurrent UTI. CT head was normal.  MRI brain was without any evidence of acute findings.  EEG and long-term EEG without evidence of seizure disorder.  TIA or strokes ruled out. Patient had stopped having spells of neurological changes.  She does have dementia at baseline.   Type 2 diabetes, uncontrolled with hyperglycemia: Due to missed dose of insulin. Better after insulin resumption.  Go back to usual dose of insulin that she takes at nursing home.   Paroxysmal A-fib: Currently rate controlled sinus rhythm.  Therapeutic on Eliquis.  Not on any rate control medications.   Hypothyroidism: TSH is suppressed.  Decrease dose of thyroxine from 200 mcg to 150 mcg.  Please recheck in 4 weeks.  Vitamin B12 deficiency: Replace by mouth.   Dementia without behavioral disturbances/deconditioning and debility: Continue to attempt PT OT at the nursing home.  Stable for discharge.  Discharge Diagnoses:  Active Problems:   Hypothyroidism   Essential hypertension, benign   PAROXYSMAL ATRIAL FIBRILLATION   Fibromyalgia   TIA (transient ischemic attack)   History of recurrent UTIs   Hard of hearing   History of CVA (cerebrovascular accident)   B12 deficiency   Acute lower UTI   Acute metabolic encephalopathy   Dyslipidemia   Uncontrolled type 2 diabetes mellitus with hyperglycemia ([place])   Depression    Discharge Instructions  Discharge Instructions     Diet - low sodium heart healthy   Complete by: As directed    Diet Carb Modified   Complete by: As directed    Increase activity slowly   Complete by: As directed       Allergies as of 01/13/2022       Reactions   Naproxen Sodium Other (  See Comments)   Fever/aches and pains   Statins Other (See Comments)   myalgias   Sulfonamide Derivatives Hives, Itching   Not documented on MAR   Latex Itching, Rash   Shellfish Allergy Itching, Swelling, Rash   Seafood, shrimp        Medication  List     STOP taking these medications    trimethoprim 100 MG tablet Commonly known as: TRIMPEX       TAKE these medications    B-D ULTRAFINE III SHORT PEN 31G X 8 MM Misc Generic drug: Insulin Pen Needle USE TO INJECT INSULIN ONCE DAILY. DX: E11.65   cephALEXin 250 MG capsule Commonly known as: KEFLEX Take 1 capsule (250 mg total) by mouth at bedtime.   cyanocobalamin 2000 MCG tablet Take 1 tablet (2,000 mcg total) by mouth daily. Start taking on: January 14, 2022   docusate sodium 100 MG capsule Commonly known as: COLACE Take 100 mg by mouth 2 (two) times daily.   DULoxetine 30 MG capsule Commonly known as: CYMBALTA TAKE 2 CAPSULES BY MOUTH EVERY DAY   Eliquis 2.5 MG Tabs tablet Generic drug: apixaban Take 2.5 mg by mouth 2 (two) times daily.   Fish Oil 1000 MG Caps Take 1,000 mg by mouth daily.   HumaLOG 100 UNIT/ML injection Generic drug: insulin lispro Inject 2-12 Units into the skin 4 (four) times daily -  before meals and at bedtime. Per sliding scale: 140-199= 2 units, 200-250= 6 units, 251-299= 8 units, 300-349= 10 units, 350-400= 12 units,   Januvia 100 MG tablet Generic drug: sitaGLIPtin TAKE 1 TABLET BY MOUTH EVERY DAY What changed: how much to take   Lantus SoloStar 100 UNIT/ML Solostar Pen Generic drug: insulin glargine Inject 42 Units into the skin 2 (two) times daily. What changed: when to take this   levothyroxine 200 MCG tablet Commonly known as: SYNTHROID Take 1 tablet (200 mcg total) by mouth daily before breakfast.   metFORMIN 1000 MG tablet Commonly known as: GLUCOPHAGE Take 1,000 mg by mouth 2 (two) times daily.   Vitamin D3 1.25 MG (50000 UT) Caps Take 1 capsule by mouth once a week. What changed:  how much to take additional instructions        Allergies  Allergen Reactions   Naproxen Sodium Other (See Comments)    Fever/aches and pains   Statins Other (See Comments)    myalgias   Sulfonamide Derivatives Hives and  Itching    Not documented on MAR   Latex Itching and Rash   Shellfish Allergy Itching, Swelling and Rash    Seafood, shrimp    Consultations: Neurology   Procedures/Studies: Overnight EEG with video  Result Date: 01/11/2022 [name], MD     01/12/2022  9:14 AM Patient Name: Demetric Parslow MRN: [id number] Epilepsy Attending: [name] Referring Physician/Provider: Greta Doom, MD Duration: 01/10/2022 1605 to 01/11/2022 1605  Patient history: 86yo F presented with confusion and not answering questions correctly. EEG to evaluate for seizure  Level of alertness: Awake, asleep  AEDs during EEG study: None  Technical aspects: This EEG study was done with scalp electrodes positioned according to the 10-20 International system of electrode placement. Electrical activity was reviewed with band pass filter of 1-'70Hz'$ , sensitivity of 7 uV/mm, display speed of 64m/sec with a '60Hz'$  notched filter applied as appropriate. EEG data were recorded continuously and digitally stored.  Video monitoring was available and reviewed as appropriate.  Description: The posterior dominant rhythm consists of  8 Hz activity of moderate voltage (25-35 uV) seen predominantly in posterior head regions, symmetric and reactive to eye opening and eye closing. Sleep was characterized by sleep spindles (12-'14hz'$ ), maximal fronto-central region. EEG showed intermittent generalized and maximal left temporal 3 to 6 Hz theta-delta slowing. Hyperventilation and photic stimulation were not performed.    ABNORMALITY - Intermittent slow, generalized and maximal left temporal region  IMPRESSION: This study is suggestive of cortical dysfunction arising from left temporal region, nonspecific etiology. Additionally there is mild diffuse encephalopathy, nonspecific etiology. No seizures or epileptiform discharges were seen throughout the recording.  [name]   ECHOCARDIOGRAM COMPLETE  Result Date: 01/10/2022     ECHOCARDIOGRAM REPORT   Patient Name:   MONACA WADAS Date of Exam: 01/10/2022 Medical Rec #:  [id number]       Height:       66.0 in Accession #:    [id number]      Weight:       131.4 lb Date of Birth:  1936-02-14       BSA:          1.673 m Patient Age:    60 years        BP:           138/73 mmHg Patient Gender: F               HR:           103 bpm. Exam Location:  Inpatient Procedure: 2D Echo Indications:    TIA  History:        Patient has prior history of Echocardiogram examinations, most                 recent 08/22/2019. Arrythmias:Paroxysmal atrial fibrillation; Risk                 Factors:Diabetes and Hypertension.  Sonographer:    Johny Chess RDCS Referring Phys: [id number] [place] Comments: Image acquisition challenging due to uncooperative patient. IMPRESSIONS  1. Left ventricular ejection fraction, by estimation, is 65 to 70%. The left ventricle has normal function. The left ventricle has no regional wall motion abnormalities. There is mild concentric left ventricular hypertrophy. Left ventricular diastolic parameters are consistent with Grade I diastolic dysfunction (impaired relaxation).  2. Right ventricular systolic function is hyperdynamic. The right ventricular size is normal. Tricuspid regurgitation signal is inadequate for assessing PA pressure.  3. A small pericardial effusion is present. The pericardial effusion is anterior to the right ventricle. There is no evidence of cardiac tamponade.  4. The mitral valve was not well visualized. No evidence of mitral valve regurgitation. No evidence of mitral stenosis.  5. The aortic valve was not well visualized. Aortic valve regurgitation is not visualized. Aortic valve sclerosis is present, with no evidence of aortic valve stenosis.  6. The inferior vena cava is normal in size with greater than 50% respiratory variability, suggesting right atrial pressure of 3 mmHg. Comparison(s): No significant change from prior study. FINDINGS   Left Ventricle: Left ventricular ejection fraction, by estimation, is 65 to 70%. The left ventricle has normal function. The left ventricle has no regional wall motion abnormalities. The left ventricular internal cavity size was small. There is mild concentric left ventricular hypertrophy. Left ventricular diastolic parameters are consistent with Grade I diastolic dysfunction (impaired relaxation). Right Ventricle: The right ventricular size is normal. Right vetricular wall thickness was not well visualized. Right ventricular systolic function is hyperdynamic. Tricuspid regurgitation signal  is inadequate for assessing PA pressure. Left Atrium: Left atrial size was normal in size. Right Atrium: Right atrial size was normal in size. Pericardium: A small pericardial effusion is present. The pericardial effusion is anterior to the right ventricle. There is no evidence of cardiac tamponade. Presence of epicardial fat layer. Mitral Valve: The mitral valve was not well visualized. Mild mitral annular calcification. No evidence of mitral valve regurgitation. No evidence of mitral valve stenosis. Tricuspid Valve: The tricuspid valve is normal in structure. Tricuspid valve regurgitation is not demonstrated. No evidence of tricuspid stenosis. Aortic Valve: The aortic valve was not well visualized. Aortic valve regurgitation is not visualized. Aortic valve sclerosis is present, with no evidence of aortic valve stenosis. Pulmonic Valve: The pulmonic valve was not well visualized. Pulmonic valve regurgitation is not visualized. No evidence of pulmonic stenosis. Aorta: The aortic root and ascending aorta are structurally normal, with no evidence of dilitation. Venous: The inferior vena cava is normal in size with greater than 50% respiratory variability, suggesting right atrial pressure of 3 mmHg. IAS/Shunts: No atrial level shunt detected by color flow Doppler.  LEFT VENTRICLE PLAX 2D LVIDd:         3.20 cm   Diastology LVIDs:          2.10 cm   LV e' medial:    5.00 cm/s LV PW:         1.00 cm   LV E/e' medial:  12.7 LV IVS:        0.90 cm   LV e' lateral:   5.77 cm/s LVOT diam:     1.70 cm   LV E/e' lateral: 11.0 LVOT Area:     2.27 cm  RIGHT VENTRICLE             IVC RV S prime:     16.30 cm/s  IVC diam: 1.20 cm TAPSE (M-mode): 0.5 cm LEFT ATRIUM             Index LA diam:        2.80 cm 1.67 cm/m LA Vol (A2C):   19.3 ml 11.54 ml/m LA Vol (A4C):   26.9 ml 16.08 ml/m LA Biplane Vol: 24.1 ml 14.41 ml/m   AORTA Ao Root diam: 2.70 cm Ao Asc diam:  2.80 cm MITRAL VALVE MV Area (PHT): 5.38 cm     SHUNTS MV Decel Time: 141 msec     Systemic Diam: 1.70 cm MV E velocity: 63.60 cm/s MV A velocity: 103.00 cm/s MV E/A ratio:  0.62 Rudean Haskell MD Electronically signed by Rudean Haskell MD Signature Date/Time: 01/10/2022/12:18:48 PM    Final    EEG adult  Result Date: 01/10/2022 [name], MD     01/10/2022  8:02 AM Patient Name: Susanne Baumgarner MRN: [id number] Epilepsy Attending: [name] Referring Physician/Provider: Donnetta Simpers, MD Date: 01/10/2022 Duration: 23.31 mins Patient history: 86yo F presented with confusion and not answering questions correctly. EEG to evaluate for seizure Level of alertness: Awake AEDs during EEG study: None Technical aspects: This EEG study was done with scalp electrodes positioned according to the 10-20 International system of electrode placement. Electrical activity was reviewed with band pass filter of 1-'70Hz'$ , sensitivity of 7 uV/mm, display speed of 18m/sec with a '60Hz'$  notched filter applied as appropriate. EEG data were recorded continuously and digitally stored.  Video monitoring was available and reviewed as appropriate. Description: The posterior dominant rhythm consists of 8 Hz activity of moderate voltage (25-35 uV) seen predominantly in  posterior head regions, symmetric and reactive to eye opening and eye closing. EEG showed intermittent generalized and maximal  left temporal 3 to 6 Hz theta-delta slowing. Hyperventilation and photic stimulation were not performed.   ABNORMALITY - Intermittent slow, generalized and maximal left temporal region IMPRESSION: This study is suggestive of cortical dysfunction arising from left temporal region, nonspecific etiology. Additionally there is mild diffuse encephalopathy, nonspecific etiology. No seizures or epileptiform discharges were seen throughout the recording. [name]   MR BRAIN WO CONTRAST  Result Date: 01/10/2022 CLINICAL DATA:  Altered mental status, confusion EXAM: MRI HEAD WITHOUT CONTRAST TECHNIQUE: Multiplanar, multiecho pulse sequences of the brain and surrounding structures were obtained without intravenous contrast. COMPARISON:  02/20/2021 MRI head, correlation is also made with 01/09/2022 CT head FINDINGS: Brain: No restricted diffusion to suggest acute or subacute infarct. No acute hemorrhage, mass, mass effect, or midline shift. Ventriculomegaly, which reflects central parenchymal volume loss is commensurate with sulcal size. No hydrocephalus or extra-axial collection. No hemosiderin deposition to suggest remote hemorrhage. Confluent T2 hyperintense signal in the periventricular white matter and pons, likely the sequela of severe chronic small vessel ischemic disease. Vascular: Normal arterial flow voids. Skull and upper cervical spine: Normal marrow signal. Sinuses/Orbits: Air-fluid level in the right sphenoid sinus. Status post left lens replacement. Other: None. IMPRESSION: 1. No acute intracranial process. No evidence of acute or subacute infarct. 2. Air-fluid level in the right sphenoid sinus, which is nonspecific but can be seen in the setting of acute sinusitis. Electronically Signed   By: Merilyn Baba M.D.   On: 01/10/2022 00:02   CT HEAD CODE STROKE WO CONTRAST  Result Date: 01/09/2022 CLINICAL DATA:  Code stroke. Altered mental status, aphasia, right-sided facial droop EXAM: CT HEAD  WITHOUT CONTRAST TECHNIQUE: Contiguous axial images were obtained from the base of the skull through the vertex without intravenous contrast. RADIATION DOSE REDUCTION: This exam was performed according to the departmental dose-optimization program which includes automated exposure control, adjustment of the mA and/or kV according to patient size and/or use of iterative reconstruction technique. COMPARISON:  11/15/2021 FINDINGS: Brain: No evidence of acute infarction, hemorrhage, cerebral edema, mass, mass effect, or midline shift. No hydrocephalus or extra-axial collection. Periventricular white matter changes, likely the sequela of chronic small vessel ischemic disease. Vascular: No hyperdense vessel. Atherosclerotic calcifications in the intracranial carotid and vertebral arteries. Skull: Negative for fracture or focal lesion. Sinuses/Orbits: Status post left lens replacement. Clear paranasal sinuses. Other: None. ASPECTS [facility] Stroke Program Early CT Score) - Ganglionic level infarction (caudate, lentiform nuclei, internal capsule, insula, M1-M3 cortex): 7 - Supraganglionic infarction (M4-M6 cortex): 3 Total score (0-10 with 10 being normal): 10 IMPRESSION: 1. No acute intracranial process. 2. ASPECTS is 10. Code stroke imaging results were communicated on 01/09/2022 at 10:13 pm to provider Dr. Lorrin Goodell via secure text paging. Electronically Signed   By: Merilyn Baba M.D.   On: 01/09/2022 22:13   (Echo, Carotid, EGD, Colonoscopy, ERCP)    Subjective: Patient was seen and examined.  Pleasant but confused.  No other overnight events. Called and discussed with patient's son who reported that patient did not have any more spells of worsening confusion and gibberish speech since yesterday morning, however she remains pleasant with baseline confusion.  Remains afebrile.   Discharge Exam: Vitals:   01/13/22 0432 01/13/22 0840  BP: 127/69 (!) 140/64  Pulse: 90 83  Resp: 18 18  Temp: 98.9 F (37.2 C)  98.7 F (37.1 C)  SpO2: 96% 97%  Vitals:   01/12/22 1947 01/13/22 0036 01/13/22 0432 01/13/22 0840  BP: (!) 149/64 127/81 127/69 (!) 140/64  Pulse: 84 85 90 83  Resp: '18 18 18 18  '$ Temp: 98.2 F (36.8 C) 98.3 F (36.8 C) 98.9 F (37.2 C) 98.7 F (37.1 C)  TempSrc: Oral Oral Oral Oral  SpO2: 99% 98% 96% 97%  Weight:      Height:        General: Pt is alert, awake, not in acute distress Pleasant.  Oriented x1-2.  Moves all extremities. Cardiovascular: RRR, S1/S2 +, no rubs, no gallops Respiratory: CTA bilaterally, no wheezing, no rhonchi Abdominal: Soft, NT, ND, bowel sounds + Extremities: no edema, no cyanosis    The results of significant diagnostics from this hospitalization (including imaging, microbiology, ancillary and laboratory) are listed below for reference.     Microbiology: Recent Results (from the past 240 hour(s))  Urine Culture     Status: Abnormal   Collection Time: 01/11/22  9:25 PM   Specimen: Urine, Clean Catch  Result Value Ref Range Status   Specimen Description URINE, CLEAN CATCH  Final   Special Requests   Final    NONE Performed at [facility], [address] [place], [place] [address]    Culture MULTIPLE SPECIES PRESENT, SUGGEST RECOLLECTION (A)  Final   Report Status 01/13/2022 FINAL  Final     Labs: BNP (last 3 results) No results for input(s): "BNP" in the last 8760 hours. Basic Metabolic Panel: Recent Labs  Lab 01/09/22 2203 01/09/22 2204 01/10/22 0941  NA 137 137 137  K 4.3 4.2 4.5  CL 101 102 104  CO2 24  --  20*  GLUCOSE 340* 350* 236*  BUN '12 13 12  '$ CREATININE 0.98 0.70 0.80  CALCIUM 9.3  --  9.0   Liver Function Tests: Recent Labs  Lab 01/09/22 2203  AST 18  ALT 16  ALKPHOS 61  BILITOT 0.6  PROT 6.9  ALBUMIN 3.4*   No results for input(s): "LIPASE", "AMYLASE" in the last 168 hours. Recent Labs  Lab 01/09/22 2241  AMMONIA 13   CBC: Recent Labs  Lab 01/09/22 2203 01/09/22 2204 01/10/22 0428   WBC 7.0  --  6.1  NEUTROABS 4.2  --   --   HGB 11.8* 11.9* 11.0*  HCT 35.6* 35.0* 33.0*  MCV 89.9  --  88.7  PLT 245  --  151   Cardiac Enzymes: No results for input(s): "CKTOTAL", "CKMB", "CKMBINDEX", "TROPONINI" in the last 168 hours. BNP: Invalid input(s): "POCBNP" CBG: Recent Labs  Lab 01/12/22 0628 01/12/22 1151 01/12/22 1639 01/12/22 2152 01/13/22 0604  GLUCAP 309* 245* 267* 342* 245*   D-Dimer No results for input(s): "DDIMER" in the last 72 hours. Hgb A1c No results for input(s): "HGBA1C" in the last 72 hours. Lipid Profile No results for input(s): "CHOL", "HDL", "LDLCALC", "TRIG", "CHOLHDL", "LDLDIRECT" in the last 72 hours. Thyroid function studies No results for input(s): "TSH", "T4TOTAL", "T3FREE", "THYROIDAB" in the last 72 hours.  Invalid input(s): "FREET3" Anemia work up No results for input(s): "VITAMINB12", "FOLATE", "FERRITIN", "TIBC", "IRON", "RETICCTPCT" in the last 72 hours. Urinalysis    Component Value Date/Time   COLORURINE AMBER (A) 01/09/2022 2220   APPEARANCEUR CLOUDY (A) 01/09/2022 2220   LABSPEC 1.023 01/09/2022 2220   PHURINE 5.0 01/09/2022 2220   GLUCOSEU 150 (A) 01/09/2022 2220   GLUCOSEU >=1000 (A) 03/16/2019 0915   HGBUR NEGATIVE 01/09/2022 [place] 01/09/2022 2220  BILIRUBINUR Negative 12/25/2020 1243   KETONESUR NEGATIVE 01/09/2022 2220   PROTEINUR NEGATIVE 01/09/2022 2220   UROBILINOGEN 0.2 12/25/2020 1243   UROBILINOGEN 0.2 03/16/2019 0915   NITRITE POSITIVE (A) 01/09/2022 2220   LEUKOCYTESUR MODERATE (A) 01/09/2022 2220   Sepsis Labs Recent Labs  Lab 01/09/22 2203 01/10/22 0428  WBC 7.0 6.1   Microbiology Recent Results (from the past 240 hour(s))  Urine Culture     Status: Abnormal   Collection Time: 01/11/22  9:25 PM   Specimen: Urine, Clean Catch  Result Value Ref Range Status   Specimen Description URINE, CLEAN CATCH  Final   Special Requests   Final    NONE Performed at [facility], [place] [address], [place], [place] [address]    Culture MULTIPLE SPECIES PRESENT, SUGGEST RECOLLECTION (A)  Final   Report Status 01/13/2022 FINAL  Final     Time coordinating discharge: 35 minutes  SIGNED:   Barb Merino, MD  [facility] 01/13/2022, 11:52 AM

## 2022-01-13 NOTE — Evaluation (Signed)
Speech Language Pathology Evaluation Patient Details Name: Jaclyn [name] MRN: [id number] DOB: 1935-07-07 Today's Date: 01/13/2022 Time: 2671-2458 SLP Time Calculation (min) (ACUTE ONLY): 20 min  Problem List:  Patient Active Problem List   Diagnosis Date Noted   Hypomagnesemia 11/15/2021   Depression 11/15/2021   Incontinence 02/17/2021   Ataxia 02/17/2021   UTI (urinary tract infection) 02/16/2021   Failure to thrive in adult 02/16/2021   Mixed stress and urge urinary incontinence 02/05/2021   Yeast infection 12/03/2020   Hypokalemia    Labile blood glucose    Uncontrolled type 2 diabetes mellitus with hyperglycemia (HCC)    Orthostatic hypotension    Elevated blood pressure reading    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   Recurrent UTI    Dyslipidemia    Orthostasis    Dysarthria    Vagina, candidiasis 03/10/2019   Dementia ([place]) 03/10/2019   Acute respiratory failure with hypoxia ([place]) 09/98/3382   Acute metabolic encephalopathy 50/53/9767   COVID-19 virus infection 01/15/2019   Acute lower UTI 01/15/2019   B12 deficiency 01/22/2018   History of CVA (cerebrovascular accident) 06/20/2017   Stenosis of right carotid artery    Peripheral edema 07/29/2016   Imbalance 04/07/2016   Intertriginous candidiasis 04/26/2015   Hard of hearing 04/12/2015   TIA (transient ischemic attack) 04/10/2015   History of recurrent UTIs 04/10/2015   Dysuria 03/09/2015   Tremor 05/11/2014   Leg weakness, bilateral 05/11/2014   Fatty liver 08/04/2013   Personal history of colonic polyps 10/05/2012   Esophageal reflux 10/05/2012   Carotid stenosis, symptomatic, with infarction ([place]) 10/14/2010   MDD (major depressive disorder), single episode, moderate ([place]) 01/13/2008   Essential hypertension, benign 01/13/2008   PAROXYSMAL ATRIAL FIBRILLATION 01/13/2008   Intracranial vascular stenosis 01/13/2008   DIVERTICULOSIS OF COLON 01/13/2008   Fibromyalgia 01/13/2008    CHEST PAIN, ATYPICAL 01/13/2008   Hypothyroidism 04/13/2007   Type 2 diabetes mellitus ([place]) 04/13/2007   Vitamin D deficiency 04/13/2007   Hyperlipidemia LDL goal <70 04/13/2007   DEGENERATIVE JOINT DISEASE 04/13/2007   Past Medical History:  Past Medical History:  Diagnosis Date   Atrial fibrillation ([place])    Benign neoplasm of colon    Carotid artery occlusion    60-79% right ICA stenosis   Difficult intubation 02/17/2006   During surgery to remove large polyp   Diverticulosis of colon (without mention of hemorrhage)    Dysthymic disorder    Fibromyalgia    Headache(784.0)    Insomnia, unspecified    Interstitial cystitis    Osteoarthrosis, unspecified whether generalized or localized, unspecified site    Other and unspecified hyperlipidemia    Other chest pain    Other specified benign mammary dysplasias    TIA (transient ischemic attack)    Type II or unspecified type diabetes mellitus without mention of complication, not stated as uncontrolled    Unspecified essential hypertension    Unspecified hypothyroidism    Unspecified vitamin D deficiency    Urinary tract infection, site not specified    Past Surgical History:  Past Surgical History:  Procedure Laterality Date   ABDOMINAL HYSTERECTOMY  1988   cervical dysplasia   CARDIAC CATHETERIZATION  02/19/06   EF 60%   COLONOSCOPY     ENDARTERECTOMY Right 08/26/2019   Procedure: RIGHT CAROTID ENDARTERECTOMY;  Surgeon: Rosetta Posner, MD;  Location: [facility] OR;  Service: Vascular;  Laterality: Right;   PATCH ANGIOPLASTY Right 08/26/2019   Procedure: PATCH ANGIOPLASTY OF RIGHT COMMON CAROTID  ARTERY USING HEMASHIELD PLATINUM FINESSE PATCH;  Surgeon: Rosetta Posner, MD;  Location: [facility] OR;  Service: Vascular;  Laterality: Right;   RIGHT COLECTOMY  2005   for villous adenoma of the cecum Dr.streck   VESICOVAGINAL FISTULA CLOSURE W/ [place]   w/ cystocele &retocele repairs Dr. Ree Edman   HPI:  86 y.o. female presents to [facility] hospital on  01[facility]2022 with AMS, receptive aphasia, word finding difficulties. CT and MRI negative for acute findings. Pt found to have UTI. PMH significant of dementia, paroxysmal A-fib on Eliquis, fibromyalgia, hypertension, poorly controlled type 2 diabetes, hypothyroidism, TIA. Most recent speech langauge evaluation 02/2021 noted mixed receptive/expressive deficits, suspect in the setting of encephalopathy.   Assessment / Plan / Recommendation Clinical Impression  Pt seen for cognitive linguistic evaluation. She was alert and participated in assessment. Pt with history of cognitive impairment and dementia, most recent speech language evaluation 02/2021, noted mixed receptive and expressive deficits secondary to encephalopathy. Pt presents today as significantly hard of hearing, requiring multiple repetitions and increased clinician volume. She displayed left sided neglect to visual and verbal stimuli. Pt oriented to self and city, but not to year (stated it was 1990) or location. Provided options pt was able to state she was in the hospital. Portions of a cognitive-linguistic screening tool was administered. Pt presents with minor deficits in expressive and receptive language. She displayed minor difficulty following commands and answering yes/no questions, which is in part complicated by noted hearing difficulty. Pt accurately followed 5/6 commands and answered 3/4 yes no questions. Confrontational naming presents as within functional limits, however, pt was only able to name 3 animals in a divergent naming task. Pt displayed functional problem solving skills, she described how to call for help and what to do in the instance she needs the restroom. Suspect she is at or close to her baseline functioning in the areas of expressive/ receptive language and cognition. No SLP treatment recommended.    SLP Assessment  SLP Recommendation/Assessment: Patient does not need any further Speech [place] Pathology Services SLP  Visit Diagnosis: Cognitive communication deficit (R41.841)    Recommendations for follow up therapy are one component of a multi-disciplinary discharge planning process, led by the attending physician.  Recommendations may be updated based on patient status, additional functional criteria and insurance authorization.    Follow Up Recommendations  No SLP follow up    Assistance Recommended at Discharge  Frequent or constant Supervision/Assistance  Functional Status Assessment Patient has not had a recent decline in their functional status  Frequency and Duration           SLP Evaluation Cognition  Overall Cognitive Status: History of cognitive impairments - at baseline Arousal/Alertness: Awake/alert Orientation Level: Oriented to person;Disoriented to time Year: Other (Comment) (1990) Attention: Focused Focused Attention: Appears intact Memory: Impaired Memory Impairment: Decreased long term memory Decreased Long Term Memory: Verbal basic Awareness: Appears intact Problem Solving: Appears intact Problem Solving Impairment: Verbal basic Executive Function: Reasoning Reasoning: Appears intact       Comprehension  Auditory Comprehension Overall Auditory Comprehension: Impaired at baseline Yes/No Questions: Impaired Basic Immediate Environment Questions: 75-100% accurate Commands: Impaired One Step Basic Commands: 75-100% accurate Conversation: Simple Interfering Components: Hearing EffectiveTechniques: Increased volume;Extra processing time;Repetition Visual Recognition/Discrimination Discrimination: Not tested Reading Comprehension Reading Status: Not tested    Expression Expression Primary Mode of Expression: Verbal Verbal Expression Overall Verbal Expression: Appears within functional limits for tasks assessed Initiation: No impairment Level of Generative/Spontaneous Verbalization: Phrase Naming:  Impairment Responsive: Not tested Confrontation: Within functional  limits Convergent: Not tested Divergent: 25-49% accurate Pragmatics: Unable to assess Non-Verbal Means of Communication: Not applicable Written Expression Dominant Hand: Right Written Expression: Not tested   Oral / Motor  Motor Speech Overall Motor Speech: Appears within functional limits for tasks assessed Respiration: Within functional limits Phonation: Normal Resonance: Within functional limits Articulation: Within functional limitis Intelligibility: Intelligible Motor Planning: Not tested Motor Speech Errors: Not applicable            Susann Givens Student SLP  01/13/2022, 12:25 PM

## 2022-01-13 NOTE — TOC Progression Note (Signed)
Transition of Care [facility]) - Progression Note    Patient Details  Name: Jaclyn [name] MRN: [id number] Date of Birth: 05/13/1935  [facility] CM/SW Contact  Jinger Neighbors, [place] Phone Number: 01/13/2022, 1:07 PM  Clinical Narrative:    Discharge complete. Pt and son both notified.   Expected Discharge Plan: [place] Barriers to Discharge: No Barriers Identified  Expected Discharge Plan and Services Expected Discharge Plan: [place] In-house Referral: Clinical Social Work   Post Acute Care Choice: [place] Living arrangements for the past 2 months: [place] Expected Discharge Date: 01/13/22                                     Social Determinants of Health (SDOH) Interventions    Readmission Risk Interventions     No data to display

## 2022-01-13 NOTE — Procedures (Addendum)
Patient Name: Jaclyn [name]  MRN: [id number]  Epilepsy Attending: [name]  Referring Physician/Provider: Greta Doom, MD  Duration: 01/12/2022 1605 to 01/12/2022 1849   Patient history: 86yo F presented with confusion and not answering questions correctly. EEG to evaluate for seizure   Level of alertness: Awake, asleep   AEDs during EEG study: None   Technical aspects: This EEG study was done with scalp electrodes positioned according to the 10-20 International system of electrode placement. Electrical activity was reviewed with band pass filter of 1-'70Hz'$ , sensitivity of 7 uV/mm, display speed of 62m/sec with a '60Hz'$  notched filter applied as appropriate. EEG data were recorded continuously and digitally stored.  Video monitoring was available and reviewed as appropriate.   Description: The posterior dominant rhythm consists of 8 Hz activity of moderate voltage (25-35 uV) seen predominantly in posterior head regions, symmetric and reactive to eye opening and eye closing. Sleep was characterized by sleep spindles (12-'14hz'$ ), maximal fronto-central region. EEG showed intermittent generalized and maximal left temporal 3 to 6 Hz theta-delta slowing. Hyperventilation and photic stimulation were not performed.      ABNORMALITY - Intermittent slow, generalized and maximal left temporal region   IMPRESSION: This study is suggestive of cortical dysfunction arising from left temporal region, nonspecific etiology. Additionally there is mild diffuse encephalopathy, nonspecific etiology. No seizures or epileptiform discharges were seen throughout the recording.   Nazario Russom OBarbra Sarks

## 2022-01-13 NOTE — Progress Notes (Addendum)
Attempted to give report to [facility] but the nurse would not answer. I left call back info for the secretary to have the nurse call me.   Report given to [place] at [facility].

## 2022-01-13 NOTE — TOC Transition Note (Signed)
Transition of Care [facility] - CM/SW Discharge Note   Patient Details  Name: Jaclyn [name] MRN: [id number] Date of Birth: 1936/01/15  [facility] Care [facility]) CM/SW Contact:  Jinger Neighbors, LCSW Phone Number: 01/13/2022, 1:06 PM   Clinical Narrative:    Pt will be transferred to [facility] via [facility].  Call Report #: [phone] Bed #: 207D   Final next level of care: Skilled Nursing Facility Barriers to Discharge: No Barriers Identified   Patient Goals and CMS Choice Patient states their goals for this hospitalization and ongoing recovery are:: to increase strength CMS Medicare.gov Compare Post Acute Care list provided to:: Patient Choice offered to / list presented to : Patient  Discharge Placement PASRR number recieved: 01/11/22            Patient chooses bed at: [facility] Patient to be transferred to facility by: [place] Name of family member notified: Unknown [place] Patient and family notified of of transfer: 01/13/22  Discharge Plan and Services In-house Referral: Clinical Social Work   Post Acute Care Choice: [place]                               Social Determinants of Health (SDOH) Interventions     Readmission Risk Interventions     No data to display

## 2022-01-13 NOTE — Care Management Important Message (Signed)
Important Message  Patient Details  Name: Jaclyn [name] MRN: [id number] Date of Birth: 11/17/1935   Medicare Important Message Given:  Yes     [name] 01/13/2022, 3:13 PM

## 2022-01-21 DIAGNOSIS — E039 Hypothyroidism, unspecified: Secondary | ICD-10-CM | POA: Diagnosis not present

## 2022-01-21 DIAGNOSIS — E118 Type 2 diabetes mellitus with unspecified complications: Secondary | ICD-10-CM | POA: Diagnosis not present

## 2022-01-21 DIAGNOSIS — E785 Hyperlipidemia, unspecified: Secondary | ICD-10-CM | POA: Diagnosis not present

## 2022-01-21 DIAGNOSIS — M6281 Muscle weakness (generalized): Secondary | ICD-10-CM | POA: Diagnosis not present

## 2022-01-22 DIAGNOSIS — R1310 Dysphagia, unspecified: Secondary | ICD-10-CM | POA: Diagnosis not present

## 2022-01-22 DIAGNOSIS — E162 Hypoglycemia, unspecified: Secondary | ICD-10-CM | POA: Diagnosis not present

## 2022-01-22 DIAGNOSIS — R4189 Other symptoms and signs involving cognitive functions and awareness: Secondary | ICD-10-CM | POA: Diagnosis not present

## 2022-01-23 DIAGNOSIS — E119 Type 2 diabetes mellitus without complications: Secondary | ICD-10-CM | POA: Diagnosis not present

## 2022-01-23 DIAGNOSIS — I10 Essential (primary) hypertension: Secondary | ICD-10-CM | POA: Diagnosis not present

## 2022-01-24 ENCOUNTER — Encounter (HOSPITAL_COMMUNITY): Payer: Self-pay

## 2022-01-24 ENCOUNTER — Inpatient Hospital Stay (HOSPITAL_COMMUNITY)
Admission: EM | Admit: 2022-01-24 | Discharge: 2022-01-26 | DRG: 314 | Disposition: A | Discharge status: Skilled Nursing Facility | Payer: Medicare Other | Source: Skilled Nursing Facility | Attending: Internal Medicine | Admitting: Internal Medicine

## 2022-01-24 ENCOUNTER — Emergency Department (HOSPITAL_COMMUNITY): Payer: Medicare Other

## 2022-01-24 DIAGNOSIS — F32A Depression, unspecified: Secondary | ICD-10-CM | POA: Diagnosis not present

## 2022-01-24 DIAGNOSIS — Z79899 Other long term (current) drug therapy: Secondary | ICD-10-CM

## 2022-01-24 DIAGNOSIS — I959 Hypotension, unspecified: Principal | ICD-10-CM | POA: Diagnosis present

## 2022-01-24 DIAGNOSIS — M797 Fibromyalgia: Secondary | ICD-10-CM | POA: Diagnosis present

## 2022-01-24 DIAGNOSIS — E785 Hyperlipidemia, unspecified: Secondary | ICD-10-CM | POA: Diagnosis present

## 2022-01-24 DIAGNOSIS — F0393 Unspecified dementia, unspecified severity, with mood disturbance: Secondary | ICD-10-CM | POA: Diagnosis not present

## 2022-01-24 DIAGNOSIS — I10 Essential (primary) hypertension: Secondary | ICD-10-CM | POA: Diagnosis present

## 2022-01-24 DIAGNOSIS — R1312 Dysphagia, oropharyngeal phase: Secondary | ICD-10-CM | POA: Diagnosis not present

## 2022-01-24 DIAGNOSIS — Z87440 Personal history of urinary (tract) infections: Secondary | ICD-10-CM | POA: Diagnosis present

## 2022-01-24 DIAGNOSIS — Z882 Allergy status to sulfonamides status: Secondary | ICD-10-CM

## 2022-01-24 DIAGNOSIS — I480 Paroxysmal atrial fibrillation: Secondary | ICD-10-CM | POA: Diagnosis not present

## 2022-01-24 DIAGNOSIS — Z83438 Family history of other disorder of lipoprotein metabolism and other lipidemia: Secondary | ICD-10-CM

## 2022-01-24 DIAGNOSIS — F0390 Unspecified dementia without behavioral disturbance: Secondary | ICD-10-CM | POA: Diagnosis present

## 2022-01-24 DIAGNOSIS — Z7401 Bed confinement status: Secondary | ICD-10-CM | POA: Diagnosis not present

## 2022-01-24 DIAGNOSIS — Z8673 Personal history of transient ischemic attack (TIA), and cerebral infarction without residual deficits: Secondary | ICD-10-CM

## 2022-01-24 DIAGNOSIS — E119 Type 2 diabetes mellitus without complications: Secondary | ICD-10-CM

## 2022-01-24 DIAGNOSIS — E039 Hypothyroidism, unspecified: Secondary | ICD-10-CM | POA: Diagnosis present

## 2022-01-24 DIAGNOSIS — Z7901 Long term (current) use of anticoagulants: Secondary | ICD-10-CM | POA: Diagnosis not present

## 2022-01-24 DIAGNOSIS — Z20822 Contact with and (suspected) exposure to covid-19: Secondary | ICD-10-CM | POA: Diagnosis present

## 2022-01-24 DIAGNOSIS — G9341 Metabolic encephalopathy: Secondary | ICD-10-CM | POA: Diagnosis present

## 2022-01-24 DIAGNOSIS — Z7984 Long term (current) use of oral hypoglycemic drugs: Secondary | ICD-10-CM

## 2022-01-24 DIAGNOSIS — G4700 Insomnia, unspecified: Secondary | ICD-10-CM | POA: Diagnosis not present

## 2022-01-24 DIAGNOSIS — Z79890 Hormone replacement therapy (postmenopausal): Secondary | ICD-10-CM | POA: Diagnosis not present

## 2022-01-24 DIAGNOSIS — Z886 Allergy status to analgesic agent status: Secondary | ICD-10-CM | POA: Diagnosis not present

## 2022-01-24 DIAGNOSIS — R41841 Cognitive communication deficit: Secondary | ICD-10-CM | POA: Diagnosis not present

## 2022-01-24 DIAGNOSIS — R279 Unspecified lack of coordination: Secondary | ICD-10-CM | POA: Diagnosis not present

## 2022-01-24 DIAGNOSIS — Z888 Allergy status to other drugs, medicaments and biological substances status: Secondary | ICD-10-CM | POA: Diagnosis not present

## 2022-01-24 DIAGNOSIS — Z91040 Latex allergy status: Secondary | ICD-10-CM

## 2022-01-24 DIAGNOSIS — H9190 Unspecified hearing loss, unspecified ear: Secondary | ICD-10-CM | POA: Diagnosis not present

## 2022-01-24 DIAGNOSIS — Z792 Long term (current) use of antibiotics: Secondary | ICD-10-CM

## 2022-01-24 DIAGNOSIS — R6510 Systemic inflammatory response syndrome (SIRS) of non-infectious origin without acute organ dysfunction: Secondary | ICD-10-CM

## 2022-01-24 DIAGNOSIS — R509 Fever, unspecified: Secondary | ICD-10-CM | POA: Diagnosis not present

## 2022-01-24 DIAGNOSIS — I9589 Other hypotension: Secondary | ICD-10-CM | POA: Diagnosis not present

## 2022-01-24 DIAGNOSIS — Z8249 Family history of ischemic heart disease and other diseases of the circulatory system: Secondary | ICD-10-CM

## 2022-01-24 DIAGNOSIS — E860 Dehydration: Secondary | ICD-10-CM | POA: Diagnosis present

## 2022-01-24 DIAGNOSIS — Z91013 Allergy to seafood: Secondary | ICD-10-CM

## 2022-01-24 DIAGNOSIS — N390 Urinary tract infection, site not specified: Secondary | ICD-10-CM | POA: Diagnosis present

## 2022-01-24 DIAGNOSIS — R404 Transient alteration of awareness: Secondary | ICD-10-CM | POA: Diagnosis not present

## 2022-01-24 DIAGNOSIS — R4020 Unspecified coma: Secondary | ICD-10-CM | POA: Diagnosis not present

## 2022-01-24 DIAGNOSIS — R4182 Altered mental status, unspecified: Secondary | ICD-10-CM | POA: Diagnosis not present

## 2022-01-24 DIAGNOSIS — Z86010 Personal history of colonic polyps: Secondary | ICD-10-CM

## 2022-01-24 DIAGNOSIS — E861 Hypovolemia: Secondary | ICD-10-CM | POA: Diagnosis not present

## 2022-01-24 DIAGNOSIS — Z794 Long term (current) use of insulin: Secondary | ICD-10-CM

## 2022-01-24 LAB — COMPREHENSIVE METABOLIC PANEL
ALT: 13 U/L (ref 0–44)
AST: 21 U/L (ref 15–41)
Albumin: 3.1 g/dL — ABNORMAL LOW (ref 3.5–5.0)
Alkaline Phosphatase: 52 U/L (ref 38–126)
Anion gap: 11 (ref 5–15)
BUN: 21 mg/dL (ref 8–23)
CO2: 26 mmol/L (ref 22–32)
Calcium: 9.2 mg/dL (ref 8.9–10.3)
Chloride: 104 mmol/L (ref 98–111)
Creatinine, Ser: 0.93 mg/dL (ref 0.44–1.00)
GFR, Estimated: >60 mL/min (ref >60)
Glucose, Bld: 108 mg/dL — ABNORMAL HIGH (ref 70–99)
Potassium: 4.5 mmol/L (ref 3.5–5.1)
Sodium: 141 mmol/L (ref 135–145)
Total Bilirubin: 0.5 mg/dL (ref 0.3–1.2)
Total Protein: 6.9 g/dL (ref 6.5–8.1)

## 2022-01-24 LAB — CBC WITH DIFFERENTIAL/PLATELET
Abs Immature Granulocytes: 0.04 10*3/uL (ref 0.00–0.07)
Basophils Absolute: 0 10*3/uL (ref 0.0–0.1)
Basophils Relative: 0 %
Eosinophils Absolute: 0.3 10*3/uL (ref 0.0–0.5)
Eosinophils Relative: 2 %
HCT: 36.2 % (ref 36.0–46.0)
Hemoglobin: 11.5 g/dL — ABNORMAL LOW (ref 12.0–15.0)
Immature Granulocytes: 0 %
Lymphocytes Relative: 26 %
Lymphs Abs: 2.6 10*3/uL (ref 0.7–4.0)
MCH: 29.2 pg (ref 26.0–34.0)
MCHC: 31.8 g/dL (ref 30.0–36.0)
MCV: 91.9 fL (ref 80.0–100.0)
Monocytes Absolute: 1 10*3/uL (ref 0.1–1.0)
Monocytes Relative: 10 %
Neutro Abs: 6.3 10*3/uL (ref 1.7–7.7)
Neutrophils Relative %: 62 %
Platelets: 321 10*3/uL (ref 150–400)
RBC: 3.94 MIL/uL (ref 3.87–5.11)
RDW: 13.5 % (ref 11.5–15.5)
WBC: 10.3 10*3/uL (ref 4.0–10.5)
nRBC: 0 % (ref 0.0–0.2)

## 2022-01-24 LAB — URINALYSIS, ROUTINE W REFLEX MICROSCOPIC
Bilirubin Urine: NEGATIVE
Glucose, UA: NEGATIVE mg/dL
Hgb urine dipstick: NEGATIVE
Ketones, ur: NEGATIVE mg/dL
Leukocytes,Ua: NEGATIVE
Nitrite: NEGATIVE
Protein, ur: NEGATIVE mg/dL
Specific Gravity, Urine: 1.015 (ref 1.005–1.030)
pH: 5 (ref 5.0–8.0)

## 2022-01-24 LAB — LACTIC ACID, PLASMA: Lactic Acid, Venous: 3.2 mmol/L (ref 0.5–1.9)

## 2022-01-24 MED ORDER — SODIUM CHLORIDE 0.9 % IV BOLUS
1000.0000 mL | Freq: Once | INTRAVENOUS | Status: AC
  Administered 2022-01-24: 1000 mL via INTRAVENOUS

## 2022-01-24 NOTE — ED Triage Notes (Signed)
Pt. BIB [facility] for AMS. Per EMS pt. Family called from the facility due to pt. Having a decreased appetite and having decreased urine output. Pt. Has hx of recurrent UTIs. Per EMS pt. Initial BP was 70/40 given liter bolus of LR en route.

## 2022-01-24 NOTE — ED Provider Notes (Signed)
[facility] DEPT Provider Note   CSN: [id number] Arrival date & time: 01/24/22  2008     History  Chief Complaint  Patient presents with   Altered Mental Status    Jaclyn [name] is a 86 y.o. female.   Altered Mental Status Patient brought in with mental status change.  Reportedly decreased appetite and decreased urine output.  Has had previous UTIs.  Also has not been eating or drinking much.  Hypotensive with blood pressure in the 70s.  Given a liter by EMS.  No fevers.  No trauma.    Past Medical History:  Diagnosis Date   Atrial fibrillation ([place])    Benign neoplasm of colon    Carotid artery occlusion    60-79% right ICA stenosis   Difficult intubation 02/17/2006   During surgery to remove large polyp   Diverticulosis of colon (without mention of hemorrhage)    Dysthymic disorder    Fibromyalgia    Headache(784.0)    Insomnia, unspecified    Interstitial cystitis    Osteoarthrosis, unspecified whether generalized or localized, unspecified site    Other and unspecified hyperlipidemia    Other chest pain    Other specified benign mammary dysplasias    TIA (transient ischemic attack)    Type II or unspecified type diabetes mellitus without mention of complication, not stated as uncontrolled    Unspecified essential hypertension    Unspecified hypothyroidism    Unspecified vitamin D deficiency    Urinary tract infection, site not specified     Home Medications Prior to Admission medications   Medication Sig Start Date End Date Taking? Authorizing Provider  B-D ULTRAFINE III SHORT PEN 31G X 8 MM MISC USE TO INJECT INSULIN ONCE DAILY. DX: E11.65 07/02/20   Bedsole, Amy E, MD  cephALEXin (KEFLEX) 250 MG capsule Take 1 capsule (250 mg total) by mouth at bedtime. 01/13/22 05/13/22  Barb Merino, MD  Cholecalciferol (VITAMIN D3) 1.25 MG (50000 UT) CAPS Take 1 capsule by mouth once a week. Patient taking differently: Take 50,000 Units by  mouth once a week. Monday 12/27/20   Jinny Sanders, MD  cyanocobalamin 2000 MCG tablet Take 1 tablet (2,000 mcg total) by mouth daily. 01/14/22 02/13/22  Barb Merino, MD  docusate sodium (COLACE) 100 MG capsule Take 100 mg by mouth 2 (two) times daily.    [provider]  DULoxetine (CYMBALTA) 30 MG capsule TAKE 2 CAPSULES BY MOUTH EVERY DAY Patient taking differently: Take 60 mg by mouth daily. 12/27/19   Bedsole, Amy E, MD  ELIQUIS 2.5 MG TABS tablet Take 2.5 mg by mouth 2 (two) times daily. 11/01/21   [provider]  HUMALOG 100 UNIT/ML injection Inject 2-12 Units into the skin 4 (four) times daily -  before meals and at bedtime. Per sliding scale: 140-199= 2 units, 200-250= 6 units, 251-299= 8 units, 300-349= 10 units, 350-400= 12 units, 03/29/21   [provider]  insulin glargine (LANTUS SOLOSTAR) 100 UNIT/ML Solostar Pen Inject 42 Units into the skin 2 (two) times daily. Patient taking differently: Inject 42 Units into the skin every evening. 02/25/21   Thurnell Lose, MD  JANUVIA 100 MG tablet TAKE 1 TABLET BY MOUTH EVERY DAY Patient taking differently: Take 100 mg by mouth daily. 01/07/21   Jinny Sanders, MD  levothyroxine (SYNTHROID) 200 MCG tablet Take 1 tablet (200 mcg total) by mouth daily before breakfast. 01/13/22   Barb Merino, MD  metFORMIN (GLUCOPHAGE) 1000 MG tablet  Take 1,000 mg by mouth 2 (two) times daily. 10/28/21   [provider]  Omega-3 Fatty Acids (FISH OIL) 1000 MG CAPS Take 1,000 mg by mouth daily.    [provider]      Allergies    Naproxen sodium, Statins, Sulfonamide derivatives, Latex, and Shellfish allergy    Review of Systems   Review of Systems  Physical Exam Updated Vital Signs BP 92/67   Pulse 87   Temp 99.1 F (37.3 C) (Rectal)   Resp 16   SpO2 96%  Physical Exam Vitals reviewed.  Cardiovascular:     Rate and Rhythm: Regular rhythm.  Pulmonary:     Breath sounds: No wheezing or rhonchi.   Abdominal:     Tenderness: There is no abdominal tenderness.  Musculoskeletal:        General: No tenderness.     Cervical back: Neck supple.  Skin:    General: Skin is warm.     Capillary Refill: Capillary refill takes less than 2 seconds.  Neurological:     Comments: Awake answer some questions but hard of hearing.     ED Results / Procedures / Treatments   Labs (all labs ordered are listed, but only abnormal results are displayed) Labs Reviewed  COMPREHENSIVE METABOLIC PANEL - Abnormal; Notable for the following components:      Result Value   Glucose, Bld 108 (*)    Albumin 3.1 (*)    All other components within normal limits  LACTIC ACID, PLASMA - Abnormal; Notable for the following components:   Lactic Acid, Venous 3.2 (*)    All other components within normal limits  CBC WITH DIFFERENTIAL/PLATELET - Abnormal; Notable for the following components:   Hemoglobin 11.5 (*)    All other components within normal limits  URINE CULTURE  LACTIC ACID, PLASMA  URINALYSIS, ROUTINE W REFLEX MICROSCOPIC    EKG EKG Interpretation  Date/Time:  Friday January 24 2022 21:35:56 EST Ventricular Rate:  86 PR Interval:  170 QRS Duration: 87 QT Interval:  391 QTC Calculation: 468 R Axis:   259 Text Interpretation: Sinus rhythm Inferolateral infarct, old Confirmed by Davonna Belling [phone]) on 01/24/2022 9:48:28 PM  Radiology DG Chest Portable 1 View  Result Date: 01/24/2022 CLINICAL DATA:  Altered level of consciousness, decreased appetite and decreased urine output EXAM: PORTABLE CHEST 1 VIEW COMPARISON:  11/15/2021 FINDINGS: Single frontal view of the chest demonstrates an unremarkable cardiac silhouette. No airspace disease, effusion, or pneumothorax. No acute bony abnormalities. IMPRESSION: 1. No acute intrathoracic process. Electronically Signed   By: Randa Ngo M.D.   On: 01/24/2022 21:19    Procedures Procedures    Medications Ordered in ED Medications  sodium  chloride 0.9 % bolus 1,000 mL (1,000 mLs Intravenous New Bag/Given 01/24/22 2120)  sodium chloride 0.9 % bolus 1,000 mL (1,000 mLs Intravenous New Bag/Given 01/24/22 2204)    ED Course/ Medical Decision Making/ A&P                           Medical Decision Making Amount and/or Complexity of Data Reviewed Labs: ordered. Radiology: ordered.   Patient brought in for hypotension and some mental status change.  Decreased oral intake from the nursing home.  Afebrile here.  Differential diagnosis includes dehydration electrolyte abnormality medication effect, UTI, infection.  Will get basic blood work chest x-ray urinalysis.  Chest x-ray independently interpreted shows no pneumonia.  Hemoglobin is rather stable around 11.  Lactic acid mildly elevated.  Good renal function however.  Fluid bolus given and mental status improved somewhat.  Patient's granddaughter is here now and states patient is looking better.  Patient not clearly infected at this time although urinalysis is still pending.  Blood pressure improved with fluid bolus.  Initial lactic acid mildly elevated but not above 4.  Has not been able to urinate yet into pure wick.  In and out catheters been placed.  Care will be turned over to Dr. Nicholes Stairs.  CRITICAL CARE Performed by: Davonna Belling Total critical care time: 30 minutes Critical care time was exclusive of separately billable procedures and treating other patients. Critical care was necessary to treat or prevent imminent or life-threatening deterioration. Critical care was time spent personally by me on the following activities: development of treatment plan with patient and/or surrogate as well as nursing, discussions with consultants, evaluation of patient's response to treatment, examination of patient, obtaining history from patient or surrogate, ordering and performing treatments and interventions, ordering and review of laboratory studies, ordering and review of radiographic  studies, pulse oximetry and re-evaluation of patient's condition.         Final Clinical Impression(s) / ED Diagnoses Final diagnoses:  None    Rx / DC Orders ED Discharge Orders     None         Davonna Belling, MD 01/24/22 2309

## 2022-01-25 ENCOUNTER — Other Ambulatory Visit: Payer: Self-pay

## 2022-01-25 DIAGNOSIS — I480 Paroxysmal atrial fibrillation: Secondary | ICD-10-CM | POA: Diagnosis present

## 2022-01-25 DIAGNOSIS — Z79890 Hormone replacement therapy (postmenopausal): Secondary | ICD-10-CM | POA: Diagnosis not present

## 2022-01-25 DIAGNOSIS — G4700 Insomnia, unspecified: Secondary | ICD-10-CM | POA: Diagnosis present

## 2022-01-25 DIAGNOSIS — G9341 Metabolic encephalopathy: Secondary | ICD-10-CM

## 2022-01-25 DIAGNOSIS — F0393 Unspecified dementia, unspecified severity, with mood disturbance: Secondary | ICD-10-CM | POA: Diagnosis present

## 2022-01-25 DIAGNOSIS — Z888 Allergy status to other drugs, medicaments and biological substances status: Secondary | ICD-10-CM | POA: Diagnosis not present

## 2022-01-25 DIAGNOSIS — Z8673 Personal history of transient ischemic attack (TIA), and cerebral infarction without residual deficits: Secondary | ICD-10-CM | POA: Diagnosis not present

## 2022-01-25 DIAGNOSIS — Z794 Long term (current) use of insulin: Secondary | ICD-10-CM | POA: Diagnosis not present

## 2022-01-25 DIAGNOSIS — I10 Essential (primary) hypertension: Secondary | ICD-10-CM | POA: Diagnosis present

## 2022-01-25 DIAGNOSIS — E785 Hyperlipidemia, unspecified: Secondary | ICD-10-CM | POA: Diagnosis present

## 2022-01-25 DIAGNOSIS — E119 Type 2 diabetes mellitus without complications: Secondary | ICD-10-CM | POA: Diagnosis present

## 2022-01-25 DIAGNOSIS — Z20822 Contact with and (suspected) exposure to covid-19: Secondary | ICD-10-CM | POA: Diagnosis present

## 2022-01-25 DIAGNOSIS — Z7984 Long term (current) use of oral hypoglycemic drugs: Secondary | ICD-10-CM | POA: Diagnosis not present

## 2022-01-25 DIAGNOSIS — M797 Fibromyalgia: Secondary | ICD-10-CM | POA: Diagnosis present

## 2022-01-25 DIAGNOSIS — I9589 Other hypotension: Secondary | ICD-10-CM

## 2022-01-25 DIAGNOSIS — E861 Hypovolemia: Secondary | ICD-10-CM | POA: Diagnosis not present

## 2022-01-25 DIAGNOSIS — I959 Hypotension, unspecified: Secondary | ICD-10-CM | POA: Diagnosis present

## 2022-01-25 DIAGNOSIS — Z882 Allergy status to sulfonamides status: Secondary | ICD-10-CM | POA: Diagnosis not present

## 2022-01-25 DIAGNOSIS — Z79899 Other long term (current) drug therapy: Secondary | ICD-10-CM | POA: Diagnosis not present

## 2022-01-25 DIAGNOSIS — H9190 Unspecified hearing loss, unspecified ear: Secondary | ICD-10-CM | POA: Diagnosis present

## 2022-01-25 DIAGNOSIS — E039 Hypothyroidism, unspecified: Secondary | ICD-10-CM | POA: Diagnosis present

## 2022-01-25 DIAGNOSIS — Z7901 Long term (current) use of anticoagulants: Secondary | ICD-10-CM | POA: Diagnosis not present

## 2022-01-25 DIAGNOSIS — Z886 Allergy status to analgesic agent status: Secondary | ICD-10-CM | POA: Diagnosis not present

## 2022-01-25 DIAGNOSIS — E860 Dehydration: Secondary | ICD-10-CM | POA: Diagnosis present

## 2022-01-25 DIAGNOSIS — F32A Depression, unspecified: Secondary | ICD-10-CM | POA: Diagnosis present

## 2022-01-25 DIAGNOSIS — Z87440 Personal history of urinary (tract) infections: Secondary | ICD-10-CM | POA: Diagnosis not present

## 2022-01-25 DIAGNOSIS — R6510 Systemic inflammatory response syndrome (SIRS) of non-infectious origin without acute organ dysfunction: Secondary | ICD-10-CM | POA: Diagnosis present

## 2022-01-25 LAB — T4, FREE: Free T4: 1.21 ng/dL — ABNORMAL HIGH (ref 0.61–1.12)

## 2022-01-25 LAB — CBC
HCT: 31.6 % — ABNORMAL LOW (ref 36.0–46.0)
Hemoglobin: 10 g/dL — ABNORMAL LOW (ref 12.0–15.0)
MCH: 28.7 pg (ref 26.0–34.0)
MCHC: 31.6 g/dL (ref 30.0–36.0)
MCV: 90.8 fL (ref 80.0–100.0)
Platelets: 247 10*3/uL (ref 150–400)
RBC: 3.48 MIL/uL — ABNORMAL LOW (ref 3.87–5.11)
RDW: 13.5 % (ref 11.5–15.5)
WBC: 8 10*3/uL (ref 4.0–10.5)
nRBC: 0 % (ref 0.0–0.2)

## 2022-01-25 LAB — BASIC METABOLIC PANEL
Anion gap: 6 (ref 5–15)
BUN: 14 mg/dL (ref 8–23)
CO2: 25 mmol/L (ref 22–32)
Calcium: 8.4 mg/dL — ABNORMAL LOW (ref 8.9–10.3)
Chloride: 108 mmol/L (ref 98–111)
Creatinine, Ser: 0.57 mg/dL (ref 0.44–1.00)
GFR, Estimated: >60 mL/min (ref >60)
Glucose, Bld: 144 mg/dL — ABNORMAL HIGH (ref 70–99)
Potassium: 3.7 mmol/L (ref 3.5–5.1)
Sodium: 139 mmol/L (ref 135–145)

## 2022-01-25 LAB — CORTISOL: Cortisol, Plasma: 8.9 ug/dL

## 2022-01-25 LAB — CBG MONITORING, ED
Glucose-Capillary: 107 mg/dL — ABNORMAL HIGH (ref 70–99)
Glucose-Capillary: 174 mg/dL — ABNORMAL HIGH (ref 70–99)

## 2022-01-25 LAB — RESP PANEL BY RT-PCR (FLU A&B, COVID) ARPGX2
Influenza A by PCR: NEGATIVE
Influenza B by PCR: NEGATIVE
SARS Coronavirus 2 by RT PCR: NEGATIVE

## 2022-01-25 LAB — LACTIC ACID, PLASMA: Lactic Acid, Venous: 1.8 mmol/L (ref 0.5–1.9)

## 2022-01-25 LAB — TSH: TSH: 0.04 u[IU]/mL — ABNORMAL LOW (ref 0.350–4.500)

## 2022-01-25 LAB — PROCALCITONIN: Procalcitonin: <0.1 ng/mL

## 2022-01-25 LAB — GLUCOSE, CAPILLARY
Glucose-Capillary: 177 mg/dL — ABNORMAL HIGH (ref 70–99)
Glucose-Capillary: 235 mg/dL — ABNORMAL HIGH (ref 70–99)

## 2022-01-25 LAB — CORTISOL-AM, BLOOD: Cortisol - AM: 8.8 ug/dL (ref 6.7–22.6)

## 2022-01-25 MED ORDER — INSULIN ASPART 100 UNIT/ML IJ SOLN
0.0000 [IU] | Freq: Three times a day (TID) | INTRAMUSCULAR | Status: DC
  Administered 2022-01-25 (×2): 2 [IU] via SUBCUTANEOUS
  Administered 2022-01-26: 3 [IU] via SUBCUTANEOUS
  Administered 2022-01-26: 5 [IU] via SUBCUTANEOUS
  Filled 2022-01-25: qty 0.09

## 2022-01-25 MED ORDER — LACTATED RINGERS IV BOLUS
1000.0000 mL | Freq: Once | INTRAVENOUS | Status: AC
  Administered 2022-01-25: 1000 mL via INTRAVENOUS

## 2022-01-25 MED ORDER — BISACODYL 10 MG RE SUPP: 10.0000 mg | Freq: Every day | RECTAL | Status: DC | PRN

## 2022-01-25 MED ORDER — DULOXETINE HCL 30 MG PO CPEP
60.0000 mg | ORAL_CAPSULE | Freq: Every day | ORAL | Status: DC
  Administered 2022-01-25 – 2022-01-26 (×2): 60 mg via ORAL
  Filled 2022-01-25 (×2): qty 2

## 2022-01-25 MED ORDER — SODIUM CHLORIDE 0.9 % IV SOLN: 2.0000 g | Freq: Two times a day (BID) | INTRAVENOUS | Status: DC

## 2022-01-25 MED ORDER — LACTATED RINGERS IV BOLUS: 500.0000 mL | Freq: Once | INTRAVENOUS | Status: DC

## 2022-01-25 MED ORDER — LEVOTHYROXINE SODIUM 150 MCG PO TABS
150.0000 ug | ORAL_TABLET | Freq: Every day | ORAL | Status: DC
  Administered 2022-01-25 – 2022-01-26 (×2): 150 ug via ORAL
  Filled 2022-01-25 (×2): qty 1

## 2022-01-25 MED ORDER — ONDANSETRON HCL 4 MG PO TABS: 4.0000 mg | ORAL_TABLET | Freq: Four times a day (QID) | ORAL | Status: DC | PRN

## 2022-01-25 MED ORDER — ONDANSETRON HCL 4 MG/2ML IJ SOLN: 4.0000 mg | Freq: Four times a day (QID) | INTRAMUSCULAR | Status: DC | PRN

## 2022-01-25 MED ORDER — LACTATED RINGERS IV SOLN: INTRAVENOUS | Status: DC

## 2022-01-25 MED ORDER — ACETAMINOPHEN 650 MG RE SUPP: 650.0000 mg | Freq: Four times a day (QID) | RECTAL | Status: DC | PRN

## 2022-01-25 MED ORDER — VANCOMYCIN HCL IN DEXTROSE 1-5 GM/200ML-% IV SOLN
1000.0000 mg | Freq: Once | INTRAVENOUS | Status: AC
  Administered 2022-01-25: 1000 mg via INTRAVENOUS
  Filled 2022-01-25: qty 200

## 2022-01-25 MED ORDER — ENSURE ENLIVE PO LIQD
237.0000 mL | Freq: Every day | ORAL | Status: DC
  Administered 2022-01-26: 237 mL via ORAL
  Filled 2022-01-25: qty 237

## 2022-01-25 MED ORDER — SODIUM CHLORIDE 0.9% FLUSH
3.0000 mL | Freq: Two times a day (BID) | INTRAVENOUS | Status: DC
  Administered 2022-01-25 (×2): 3 mL via INTRAVENOUS

## 2022-01-25 MED ORDER — APIXABAN 2.5 MG PO TABS
2.5000 mg | ORAL_TABLET | Freq: Two times a day (BID) | ORAL | Status: DC
  Administered 2022-01-25 – 2022-01-26 (×4): 2.5 mg via ORAL
  Filled 2022-01-25 (×4): qty 1

## 2022-01-25 MED ORDER — VANCOMYCIN HCL 750 MG/150ML IV SOLN: 750.0000 mg | INTRAVENOUS | Status: DC

## 2022-01-25 MED ORDER — METRONIDAZOLE 500 MG/100ML IV SOLN
500.0000 mg | Freq: Two times a day (BID) | INTRAVENOUS | Status: DC
  Administered 2022-01-25: 500 mg via INTRAVENOUS

## 2022-01-25 MED ORDER — SENNOSIDES-DOCUSATE SODIUM 8.6-50 MG PO TABS: 1.0000 | ORAL_TABLET | Freq: Every evening | ORAL | Status: DC | PRN

## 2022-01-25 MED ORDER — SODIUM CHLORIDE 0.9 % IV SOLN
2.0000 g | Freq: Once | INTRAVENOUS | Status: AC
  Administered 2022-01-25: 2 g via INTRAVENOUS
  Filled 2022-01-25: qty 12.5

## 2022-01-25 MED ORDER — METRONIDAZOLE 500 MG/100ML IV SOLN
500.0000 mg | Freq: Once | INTRAVENOUS | Status: DC
  Filled 2022-01-25: qty 100

## 2022-01-25 MED ORDER — ACETAMINOPHEN 325 MG PO TABS: 650.0000 mg | ORAL_TABLET | Freq: Four times a day (QID) | ORAL | Status: DC | PRN

## 2022-01-25 NOTE — Assessment & Plan Note (Signed)
Continue Cymbalta.

## 2022-01-25 NOTE — Hospital Course (Addendum)
Jaclyn [name] is an 86 y.o. female with medical history significant for paroxysmal atrial fibrillation on Eliquis, insulin-dependent T2DM, hypothyroidism, interstitial cystitis on chronic suppressive antibiotic therapy, dementia, hard of hearing who is admitted with hypotension and acute metabolic encephalopathy. She responded well to hydration with fluids and mentation improved.  Appetite also improved and she was able to tolerate eating prior to discharge.

## 2022-01-25 NOTE — Assessment & Plan Note (Addendum)
-   Continue SSI and CBG monitoring ?

## 2022-01-25 NOTE — Assessment & Plan Note (Addendum)
-   Previously on chronic UTI suppression with trimethoprim which was changed to Keflex due to resistance on prior urine culture.  Appears that she has not been getting Keflex at her facility.  UA on admission is negative for UTI.

## 2022-01-25 NOTE — Social Work (Signed)
CSW spoke to [facility], patient can come back tomorrow when DC. The facility is aware.

## 2022-01-25 NOTE — Assessment & Plan Note (Addendum)
-   Patient has not been eating in several days.  Does have known underlying component of dementia per son and became overtly confused over the past couple days -Still with poor appetite when seen this morning in the ER and ongoing hypovolemia/dehydration - Given poor oral intake and inability to maintain adequate nutrition, continue fluids for now and monitor for improvement in energy and mentation  - infectious etiologies ruled out; d/c abx

## 2022-01-25 NOTE — Progress Notes (Signed)
Progress Note    Jaclyn [name]   TKW:[id number]  DOB: Jan 17, 1936  DOA: 01/24/2022     0 PCP: Jinny Sanders, MD  Initial CC: hypotension, AMS  Hospital Course: Jaclyn [name] is an 86 y.o. female with medical history significant for paroxysmal atrial fibrillation on Eliquis, insulin-dependent T2DM, hypothyroidism, interstitial cystitis on chronic suppressive antibiotic therapy, dementia, hard of hearing who is admitted with hypotension and acute metabolic encephalopathy.  Interval History:  Seen in the ER this morning with son present bedside.  She was slightly more awake which he states is improved but she was still nonverbal which is not like her.  Appetite has remained poor and therefore she is unable to maintain adequate nutrition at this time and will remain on fluids for now and further monitoring.  Assessment and Plan: * Hypotension - Patient has not been eating in several days.  Does have known underlying component of dementia per son and became overtly confused over the past couple days -Still with poor appetite when seen this morning in the ER and ongoing hypovolemia/dehydration - Given poor oral intake and inability to maintain adequate nutrition, continue fluids for now and monitor for improvement in energy and mentation  - infectious etiologies ruled out; d/c abx  Acute metabolic encephalopathy Acute metabolic encephalopathy on background of dementia in setting of hypotension and hypovolemia.  No obvious infectious process at this time. - continue IVF - d/c abx  PAF (paroxysmal atrial fibrillation) (HCC) -Continue Eliquis 2.5 mg BID  Recurrent UTI - Previously on chronic UTI suppression with trimethoprim which was changed to Keflex due to resistance on prior urine culture.  Appears that she has not been getting Keflex at her facility.  UA on admission is negative for UTI.  Hypothyroidism Continue Synthroid 150 mcg - TSH still supprssed (0.040); check FT4 and  TT3  Type 2 diabetes mellitus ([place]) - Continue SSI and CBG monitoring  Dementia (HCC) - Delirium precautions - Continue fluids and monitoring mentation  Depression Continue Cymbalta.   Old records reviewed in assessment of this patient  Antimicrobials: Cefepime, flagyl, vanc 01/25/22 x 1  DVT prophylaxis:  apixaban (ELIQUIS) tablet 2.5 mg Start: 01/25/22 0130 apixaban (ELIQUIS) tablet 2.5 mg   Code Status:   Code Status: Full Code  Mobility Assessment (last 72 hours)     Mobility Assessment   No documentation.           Barriers to discharge:  Disposition Plan:  SNF Status is: Obs  Objective: Blood pressure 132/71, pulse 74, temperature 97.8 F (36.6 C), resp. rate 15, SpO2 96 %.  Examination:  Physical Exam Constitutional:      Comments: Grossly confused and very hard of hearing elderly woman lying in bed in no distress  HENT:     Head: Normocephalic and atraumatic.     Mouth/Throat:     Mouth: Mucous membranes are dry.  Eyes:     Extraocular Movements: Extraocular movements intact.  Cardiovascular:     Rate and Rhythm: Normal rate and regular rhythm.  Pulmonary:     Effort: Pulmonary effort is normal.     Breath sounds: Normal breath sounds.  Abdominal:     General: Bowel sounds are normal. There is no distension.     Palpations: Abdomen is soft.     Tenderness: There is no abdominal tenderness.  Musculoskeletal:        General: Normal range of motion.     Cervical back: Normal range of motion.  Skin:  General: Skin is warm.  Neurological:     Comments: Unable to follow commands.  Moves all 4 extremities      Consultants:    Procedures:    Data Reviewed: Results for orders placed or performed during the hospital encounter of 01/24/22 (from the past 24 hour(s))  Comprehensive metabolic panel     Status: Abnormal   Collection Time: 01/24/22  8:31 PM  Result Value Ref Range   Sodium 141 135 - 145 mmol/L   Potassium 4.5 3.5 - 5.1  mmol/L   Chloride 104 98 - 111 mmol/L   CO2 26 22 - 32 mmol/L   Glucose, Bld 108 (H) 70 - 99 mg/dL   BUN 21 8 - 23 mg/dL   Creatinine, Ser 0.93 0.44 - 1.00 mg/dL   Calcium 9.2 8.9 - 10.3 mg/dL   Total Protein 6.9 6.5 - 8.1 g/dL   Albumin 3.1 (L) 3.5 - 5.0 g/dL   AST 21 15 - 41 U/L   ALT 13 0 - 44 U/L   Alkaline Phosphatase 52 38 - 126 U/L   Total Bilirubin 0.5 0.3 - 1.2 mg/dL   GFR, Estimated >60 >60 mL/min   Anion gap 11 5 - 15  Lactic acid, plasma     Status: Abnormal   Collection Time: 01/24/22  8:31 PM  Result Value Ref Range   Lactic Acid, Venous 3.2 (HH) 0.5 - 1.9 mmol/L  CBC with Differential     Status: Abnormal   Collection Time: 01/24/22  8:31 PM  Result Value Ref Range   WBC 10.3 4.0 - 10.5 K/uL   RBC 3.94 3.87 - 5.11 MIL/uL   Hemoglobin 11.5 (L) 12.0 - 15.0 g/dL   HCT 36.2 36.0 - 46.0 %   MCV 91.9 80.0 - 100.0 fL   MCH 29.2 26.0 - 34.0 pg   MCHC 31.8 30.0 - 36.0 g/dL   RDW 13.5 11.5 - 15.5 %   Platelets 321 150 - 400 K/uL   nRBC 0.0 0.0 - 0.2 %   Neutrophils Relative % 62 %   Neutro Abs 6.3 1.7 - 7.7 K/uL   Lymphocytes Relative 26 %   Lymphs Abs 2.6 0.7 - 4.0 K/uL   Monocytes Relative 10 %   Monocytes Absolute 1.0 0.1 - 1.0 K/uL   Eosinophils Relative 2 %   Eosinophils Absolute 0.3 0.0 - 0.5 K/uL   Basophils Relative 0 %   Basophils Absolute 0.0 0.0 - 0.1 K/uL   Immature Granulocytes 0 %   Abs Immature Granulocytes 0.04 0.00 - 0.07 K/uL  Blood culture (routine x 2)     Status: None (Preliminary result)   Collection Time: 01/24/22  8:31 PM   Specimen: BLOOD  Result Value Ref Range   Specimen Description      BLOOD LEFT ANTECUBITAL Performed at [facility], [place] [address], [place], [place] [address]    Special Requests      BOTTLES DRAWN AEROBIC AND ANAEROBIC Blood Culture adequate volume Performed at [facility] [address] [place], [place] [address]    Culture      NO GROWTH < 12 HOURS Performed at  [facility] [address] [place], [place] [address]    Report Status PENDING   Urinalysis, Routine w reflex microscopic Urine, Clean Catch     Status: Abnormal   Collection Time: 01/24/22 11:00 PM  Result Value Ref Range   Color, Urine YELLOW YELLOW   APPearance HAZY (  A) CLEAR   Specific Gravity, Urine 1.015 1.005 - 1.030   pH 5.0 5.0 - 8.0   Glucose, UA NEGATIVE NEGATIVE mg/dL   Hgb urine dipstick NEGATIVE NEGATIVE   Bilirubin Urine NEGATIVE NEGATIVE   Ketones, ur NEGATIVE NEGATIVE mg/dL   Protein, ur NEGATIVE NEGATIVE mg/dL   Nitrite NEGATIVE NEGATIVE   Leukocytes,Ua NEGATIVE NEGATIVE  Lactic acid, plasma     Status: None   Collection Time: 01/24/22 11:28 PM  Result Value Ref Range   Lactic Acid, Venous 1.8 0.5 - 1.9 mmol/L  Blood culture (routine x 2)     Status: None (Preliminary result)   Collection Time: 01/25/22  1:00 AM   Specimen: BLOOD  Result Value Ref Range   Specimen Description      BLOOD BLOOD RIGHT HAND Performed at [facility] [address] [place], [place] [address]    Special Requests      BOTTLES DRAWN AEROBIC AND ANAEROBIC Blood Culture adequate volume Performed at [facility] [address], [place], [place] [address]    Culture      NO GROWTH < 12 HOURS Performed at [facility] [address] [place], [place] [address]    Report Status PENDING   TSH     Status: Abnormal   Collection Time: 01/25/22  1:00 AM  Result Value Ref Range   TSH 0.040 (L) 0.350 - 4.500 uIU/mL  Cortisol     Status: None   Collection Time: 01/25/22  1:00 AM  Result Value Ref Range   Cortisol, Plasma 8.9 ug/dL  Resp Panel by RT-PCR (Flu A&B, Covid) Anterior Nasal Swab     Status: None   Collection Time: 01/25/22  2:49 AM   Specimen: Anterior Nasal Swab  Result Value Ref Range   SARS Coronavirus 2 by RT PCR NEGATIVE NEGATIVE   Influenza A by PCR NEGATIVE NEGATIVE   Influenza B by PCR NEGATIVE NEGATIVE   Procalcitonin     Status: None   Collection Time: 01/25/22  4:58 AM  Result Value Ref Range   Procalcitonin <0.10 ng/mL  CBC     Status: Abnormal   Collection Time: 01/25/22  4:58 AM  Result Value Ref Range   WBC 8.0 4.0 - 10.5 K/uL   RBC 3.48 (L) 3.87 - 5.11 MIL/uL   Hemoglobin 10.0 (L) 12.0 - 15.0 g/dL   HCT 31.6 (L) 36.0 - 46.0 %   MCV 90.8 80.0 - 100.0 fL   MCH 28.7 26.0 - 34.0 pg   MCHC 31.6 30.0 - 36.0 g/dL   RDW 13.5 11.5 - 15.5 %   Platelets 247 150 - 400 K/uL   nRBC 0.0 0.0 - 0.2 %  Basic metabolic panel     Status: Abnormal   Collection Time: 01/25/22  4:58 AM  Result Value Ref Range   Sodium 139 135 - 145 mmol/L   Potassium 3.7 3.5 - 5.1 mmol/L   Chloride 108 98 - 111 mmol/L   CO2 25 22 - 32 mmol/L   Glucose, Bld 144 (H) 70 - 99 mg/dL   BUN 14 8 - 23 mg/dL   Creatinine, Ser 0.57 0.44 - 1.00 mg/dL   Calcium 8.4 (L) 8.9 - 10.3 mg/dL   GFR, Estimated >60 >60 mL/min   Anion gap 6 5 - 15  Cortisol-am, blood     Status: None   Collection Time: 01/25/22  4:59 AM  Result Value Ref Range   Cortisol - AM  8.8 6.7 - 22.6 ug/dL  CBG monitoring, ED     Status: Abnormal   Collection Time: 01/25/22  7:29 AM  Result Value Ref Range   Glucose-Capillary 107 (H) 70 - 99 mg/dL  CBG monitoring, ED     Status: Abnormal   Collection Time: 01/25/22 11:19 AM  Result Value Ref Range   Glucose-Capillary 174 (H) 70 - 99 mg/dL    I have Reviewed nursing notes, Vitals, and Lab results since pt's last encounter. Pertinent lab results : see above I have ordered test including BMP, CBC, Mg I have reviewed the last note from staff over past 24 hours I have discussed pt's care plan and test results with nursing staff, case manager  Time spent: Greater than 50% of the 55 minute visit was spent in counseling/coordination of care for the patient as laid out in the A&P.    LOS: 0 days   Dwyane Dee, MD [facility] 01/25/2022, 12:42 PM

## 2022-01-25 NOTE — Progress Notes (Signed)
Pharmacy Antibiotic Note  Jaclyn [name] is a 86 y.o. female admitted on 01/24/2022 with sepsis of unknown source  Pharmacy has been consulted for Vancomycin + Cefepime dosing.  Estimated CrCl using Scr 0.93 & TBW 59.6 kg= 42 ml/min  Plan: Cefepime 2gm IV q12h Vancomycin '750mg'$  IV q24h to target AUC 400-550.  Estimated AUC 444. Monitor renal function and cx data       Temp (24hrs), Avg:99.1 F (37.3 C), Min:99.1 F (37.3 C), Max:99.1 F (37.3 C)  Recent Labs  Lab 01/24/22 2031 01/24/22 2328  WBC 10.3  --   CREATININE 0.93  --   LATICACIDVEN 3.2* 1.8    CrCl cannot be calculated (Unknown ideal weight.).    Allergies  Allergen Reactions   Naproxen Sodium Other (See Comments)    Fever/aches and pains   Statins Other (See Comments)    myalgias   Sulfonamide Derivatives Hives and Itching    Not documented on MAR   Latex Itching and Rash   Shellfish Allergy Itching, Swelling and Rash    Seafood, shrimp    Antimicrobials this admission: 12/9 Cefepime >>  12/9 Vancomycin >>  12/9 Metronidazole >>  Dose adjustments this admission:  Microbiology results: 12/8 BCx:  12/8 UCx:   MRSA PCR:  Resp PCR:   Thank you for allowing pharmacy to be a part of this patient's care.  Netta Cedars PharmD 01/25/2022 1:07 AM

## 2022-01-25 NOTE — H&P (Signed)
History and Physical    Jaclyn [name] HEN:[id number] DOB: 1935/03/26 DOA: 01/24/2022  PCP: Jinny Sanders, MD  Patient coming from: SNF  I have personally briefly reviewed patient's old medical records in [place]  Chief Complaint: Hypotension, encephalopathy  HPI: Jaclyn [name] is a 86 y.o. female with medical history significant for paroxysmal atrial fibrillation on Eliquis, insulin-dependent T2DM, hypothyroidism, interstitial cystitis on chronic suppressive antibiotic therapy, dementia, hard of hearing who presented to the ED from long-term nursing facility for evaluation of hypotension and encephalopathy.  Patient unable to provide history due to encephalopathy and dementia and is otherwise supplemented by EDP, chart review, and son at bedside.  Patient currently resides at a long-term nursing facility.  She is nonambulatory and requires a wheelchair at baseline.  Very hard of hearing.  Recently admitted 01/09/2022-01/13/2022 for acute metabolic encephalopathy due to UTI.  Previously on trimethoprim for chronic UTI suppression which was changed to Keflex on discharge due to prior resistance to trimethoprim.  TSH was suppressed and free T4 was slightly elevated therefore her Synthroid was decreased from 200 mcg to 150 mcg daily.  Per son, fairly conversant at baseline but does have some memory issues.  Over the last few days she has been very lethargic.  She has not been eating much.  Urine output reportedly diminished.  Has had increased confusion today.  Blood pressure was low at her facility therefore EMS were called.  Per ED triage notes, initial BP was 70/40 and she was given 1 L LR en route to the ED.  ED Course  Labs/Imaging on admission: I have personally reviewed following labs and imaging studies.  Initial vitals showed BP 70/59, pulse 83, RR 15, temp 99.1 F rectally, SpO2 96% on room air.  BP improved to 90/67 after initial IV fluid resuscitation.  Labs show WBC  10.3, hemoglobin 11.5, platelets 321,000, sodium 141, potassium 4.5, bicarb 26, BUN 21, creatinine 0.93, serum glucose 108, LFTs within normal limits, lactic acid 3.2 > 1.8.  Urinalysis negative for UTI.  Urine culture in process.  Blood cultures ordered and pending collection.  COVID/flu panel pending collection.  Portable chest x-ray negative for focal consolidation, edema, effusion.  Patient was given 2 L normal saline, 500 cc LR, IV vancomycin, cefepime, Flagyl.  The hospitalist service was consulted to admit for further evaluation and management.  Review of Systems: All systems reviewed and are negative except as documented in history of present illness above.   Past Medical History:  Diagnosis Date   Atrial fibrillation ([place])    Benign neoplasm of colon    Carotid artery occlusion    60-79% right ICA stenosis   Difficult intubation 02/17/2006   During surgery to remove large polyp   Diverticulosis of colon (without mention of hemorrhage)    Dysthymic disorder    Fibromyalgia    Headache(784.0)    Insomnia, unspecified    Interstitial cystitis    Osteoarthrosis, unspecified whether generalized or localized, unspecified site    Other and unspecified hyperlipidemia    Other chest pain    Other specified benign mammary dysplasias    TIA (transient ischemic attack)    Type II or unspecified type diabetes mellitus without mention of complication, not stated as uncontrolled    Unspecified essential hypertension    Unspecified hypothyroidism    Unspecified vitamin D deficiency    Urinary tract infection, site not specified     Past Surgical History:  Procedure Laterality Date   ABDOMINAL HYSTERECTOMY  1988   cervical dysplasia   CARDIAC CATHETERIZATION  02/19/06   EF 60%   COLONOSCOPY     ENDARTERECTOMY Right 08/26/2019   Procedure: RIGHT CAROTID ENDARTERECTOMY;  Surgeon: Rosetta Posner, MD;  Location: [facility] OR;  Service: Vascular;  Laterality: Right;   PATCH ANGIOPLASTY Right  08/26/2019   Procedure: PATCH ANGIOPLASTY OF RIGHT COMMON CAROTID ARTERY USING HEMASHIELD PLATINUM FINESSE PATCH;  Surgeon: Rosetta Posner, MD;  Location: [facility];  Service: Vascular;  Laterality: Right;   RIGHT COLECTOMY  2005   for villous adenoma of the cecum Dr.streck   VESICOVAGINAL FISTULA CLOSURE W/ [place]   w/ cystocele &retocele repairs Dr. Ree Edman    Social History:  reports that she has never smoked. She has never used smokeless tobacco. She reports that she does not drink alcohol and does not use drugs.  Allergies  Allergen Reactions   Naproxen Sodium Other (See Comments)    Fever/aches and pains   Statins Other (See Comments)    myalgias   Sulfonamide Derivatives Hives and Itching    Not documented on MAR   Latex Itching and Rash   Shellfish Allergy Itching, Swelling and Rash    Seafood, shrimp    Family History  Problem Relation Age of Onset   Lymphoma Father    Hypertension Father    Stroke Father    Hyperlipidemia Father    Hypertension Mother    Allergies Brother    Hypertension Sister        3   Breast cancer Sister    Fibromyalgia Sister        3   Hyperlipidemia Sister        3     Prior to Admission medications   Medication Sig Start Date End Date Taking? Authorizing Provider  Cephalexin 250 MG tablet Take 250 mg by mouth daily. 01/13/22  Yes [provider]  DULoxetine (CYMBALTA) 60 MG capsule Take 60 mg by mouth daily. 01/13/22  Yes [provider]  HUMALOG KWIKPEN 100 UNIT/ML KwikPen Inject into the skin. 01/13/22  Yes [provider]  levothyroxine (SYNTHROID) 150 MCG tablet Take 150 mcg by mouth daily. 01/13/22  Yes [provider]  B-D ULTRAFINE III SHORT PEN 31G X 8 MM MISC USE TO INJECT INSULIN ONCE DAILY. DX: E11.65 07/02/20   Bedsole, Amy E, MD  cephALEXin (KEFLEX) 250 MG capsule Take 1 capsule (250 mg total) by mouth at bedtime. 01/13/22 05/13/22  Barb Merino, MD  Cholecalciferol (VITAMIN D3) 1.25 MG  (50000 UT) CAPS Take 1 capsule by mouth once a week. Patient taking differently: Take 50,000 Units by mouth once a week. Monday 12/27/20   Jinny Sanders, MD  cyanocobalamin 2000 MCG tablet Take 1 tablet (2,000 mcg total) by mouth daily. 01/14/22 02/13/22  Barb Merino, MD  docusate sodium (COLACE) 100 MG capsule Take 100 mg by mouth 2 (two) times daily.    [provider]  DULoxetine (CYMBALTA) 30 MG capsule TAKE 2 CAPSULES BY MOUTH EVERY DAY Patient taking differently: Take 60 mg by mouth daily. 12/27/19   Bedsole, Amy E, MD  ELIQUIS 2.5 MG TABS tablet Take 2.5 mg by mouth 2 (two) times daily. 11/01/21   [provider]  HUMALOG 100 UNIT/ML injection Inject 2-12 Units into the skin 4 (four) times daily -  before meals and at bedtime. Per sliding scale: 140-199= 2 units, 200-250= 6 units, 251-299= 8 units, 300-349= 10 units, 350-400= 12 units, 03/29/21  [provider]  insulin glargine (LANTUS SOLOSTAR) 100 UNIT/ML Solostar Pen Inject 42 Units into the skin 2 (two) times daily. Patient taking differently: Inject 42 Units into the skin every evening. 02/25/21   Thurnell Lose, MD  JANUVIA 100 MG tablet TAKE 1 TABLET BY MOUTH EVERY DAY Patient taking differently: Take 100 mg by mouth daily. 01/07/21   Jinny Sanders, MD  levothyroxine (SYNTHROID) 200 MCG tablet Take 1 tablet (200 mcg total) by mouth daily before breakfast. 01/13/22   Barb Merino, MD  metFORMIN (GLUCOPHAGE) 1000 MG tablet Take 1,000 mg by mouth 2 (two) times daily. 10/28/21   [provider]  Omega-3 Fatty Acids (FISH OIL) 1000 MG CAPS Take 1,000 mg by mouth daily.    [provider]    Physical Exam: Vitals:   01/24/22 2215 01/24/22 2245 01/24/22 2315 01/25/22 0000  BP: '91/75 92/67 92/60 '$ 97/82  Pulse: 85 87 86 87  Resp: '15 16 17 16  '$ Temp:      TempSrc:      SpO2: 98% 96% 99% 100%   Constitutional: Elderly woman resting supine in bed, lethargic but NAD, calm, appears  comfortable.  Awakens to verbal and light physical stimuli Eyes: PERRL, EOMI, lids and conjunctivae normal ENMT: Mucous membranes are dry. Posterior pharynx clear of any exudate or lesions.poor dentition.  Neck: normal, supple, no masses. Respiratory: clear to auscultation bilaterally, no wheezing, no crackles. Normal respiratory effort. No accessory muscle use.  Cardiovascular: Regular rate and rhythm, no murmurs / rubs / gallops. No extremity edema. 2+ pedal pulses. Abdomen: Soft, no tenderness, no masses palpated.  Musculoskeletal: no clubbing / cyanosis. No joint deformity upper and lower extremities. Good ROM, no contractures. Normal muscle tone.  Skin: no rashes, lesions, ulcers. No induration Neurologic: Sensation intact. Strength equal all extremities.  Psychiatric: Lethargic, awakens to voice.  Oriented to self and recognizes son at bedside.  EKG: Personally reviewed. Sinus rhythm, rate 86, no acute ischemic changes.  Assessment/Plan Principal Problem:   Hypotension Active Problems:   Acute metabolic encephalopathy   PAROXYSMAL ATRIAL FIBRILLATION   Hypothyroidism   Type 2 diabetes mellitus ([place])   Dementia (HCC)   Recurrent UTI   Depression   Verna Armel is a 86 y.o. female with medical history significant for paroxysmal atrial fibrillation on Eliquis, insulin-dependent T2DM, hypothyroidism, interstitial cystitis on chronic suppressive antibiotic therapy, dementia, hard of hearing who is admitted with hypotension and acute metabolic encephalopathy.  Assessment and Plan: * Hypotension Appears significantly hypovolemic/dehydrated on admission.  Not on antihypertensives as an outpatient.  Sepsis not present on admission.  No obvious infectious source but will keep on empiric antibiotics.  UA is negative.  Chest x-ray without evidence of pneumonia.  BP remains labile. -Give additional 1.5 L LR followed by continuous infusion at 125 mL/hour overnight -Additional fluid bolus  and can add midodrine if needed -Continue empiric IV vancomycin, cefepime, Flagyl -Follow-up blood cultures, COVID/flu panel -Check TSH, cortisol  Acute metabolic encephalopathy Acute metabolic encephalopathy on background of dementia in setting of hypotension and hypovolemia.  No obvious infectious process at this time. -Continue IV fluid hydration -Continue empiric broad-spectrum antibiotics for now pending further culture data  PAROXYSMAL ATRIAL FIBRILLATION In sinus rhythm on admission with controlled rate. -Continue Eliquis 2.5 mg BID  Hypothyroidism Continue Synthroid 150 mcg.  Follow TSH.  Type 2 diabetes mellitus ([place]) Place on sensitive SSI.  Depression Continue Cymbalta.  Recurrent UTI Previously on chronic UTI suppression with trimethoprim which was changed  to Keflex due to resistance on prior urine culture.  Appears that she has not been getting Keflex at her facility.  UA on admission is negative for UTI.  DVT prophylaxis: apixaban (ELIQUIS) tablet 2.5 mg Start: 01/25/22 1000 apixaban (ELIQUIS) tablet 2.5 mg   Code Status: Full code, discussed with son on admission Family Communication: Son at bedside Disposition Plan: From SNF, dispo pending clinical progress Consults called: None Severity of Illness: The appropriate patient status for this patient is OBSERVATION. Observation status is judged to be reasonable and necessary in order to provide the required intensity of service to ensure the patient's safety. The patient's presenting symptoms, physical exam findings, and initial radiographic and laboratory data in the context of their medical condition is felt to place them at decreased risk for further clinical deterioration. Furthermore, it is anticipated that the patient will be medically stable for discharge from the hospital within 2 midnights of admission.   Zada Finders MD [facility]  If 7PM-7AM, please contact  night-coverage [email or web address]  01/25/2022, 1:17 AM

## 2022-01-25 NOTE — Assessment & Plan Note (Addendum)
Continue Synthroid 150 mcg - TSH still supprssed (0.040); check FT4 and TT3

## 2022-01-25 NOTE — Progress Notes (Signed)
A consult was received from an ED physician for Cefepime + Vancomycin per pharmacy dosing.  The patient's profile has been reviewed for ht/wt/allergies/indication/available labs.   A one time order has been placed for Vancomycin 1gm + Cefepime 2gm IV.  Further antibiotics/pharmacy consults should be ordered by admitting physician if indicated.                       Thank you, Netta Cedars PharmD 01/25/2022  12:27 AM

## 2022-01-25 NOTE — Assessment & Plan Note (Addendum)
Continue Eliquis 2.5mg BID.  

## 2022-01-25 NOTE — Assessment & Plan Note (Addendum)
Acute metabolic encephalopathy on background of dementia in setting of hypotension and hypovolemia.  No obvious infectious process at this time. - continue IVF - d/c abx -Patient back to baseline prior to discharge

## 2022-01-25 NOTE — Assessment & Plan Note (Signed)
-   Delirium precautions - Continue fluids and monitoring mentation

## 2022-01-26 DIAGNOSIS — I9589 Other hypotension: Secondary | ICD-10-CM | POA: Diagnosis not present

## 2022-01-26 DIAGNOSIS — G9341 Metabolic encephalopathy: Secondary | ICD-10-CM | POA: Diagnosis not present

## 2022-01-26 DIAGNOSIS — E861 Hypovolemia: Secondary | ICD-10-CM | POA: Diagnosis not present

## 2022-01-26 LAB — GLUCOSE, CAPILLARY
Glucose-Capillary: 243 mg/dL — ABNORMAL HIGH (ref 70–99)
Glucose-Capillary: 263 mg/dL — ABNORMAL HIGH (ref 70–99)

## 2022-01-26 NOTE — Discharge Summary (Signed)
Physician Discharge Summary   Jaclyn [name] ZOX:[id number] DOB: 1935-05-29 DOA: 01/24/2022  PCP: Jinny Sanders, MD  Admit date: 01/24/2022 Discharge date: 01/26/2022 Barriers to discharge: none  Admitted From: [facility] Disposition:  [facility] Discharging physician: Dwyane Dee, MD  Recommendations for Outpatient Follow-up:  Repeat TSH and T4 in 2-3 weeks. May need further adjustment at that time  Home Health:  Equipment/Devices:   Discharge Condition: stable CODE STATUS: Full Diet recommendation:  Diet Orders (From admission, onward)     Start     Ordered   01/26/22 0000  Diet general        01/26/22 1106   01/25/22 0104  Diet regular Room service appropriate? Yes; Fluid consistency: Thin  Diet effective now       Question Answer Comment  Room service appropriate? Yes   Fluid consistency: Thin      01/25/22 0105            Hospital Course: Jaclyn [name] is an 86 y.o. female with medical history significant for paroxysmal atrial fibrillation on Eliquis, insulin-dependent T2DM, hypothyroidism, interstitial cystitis on chronic suppressive antibiotic therapy, dementia, hard of hearing who is admitted with hypotension and acute metabolic encephalopathy. She responded well to hydration with fluids and mentation improved.  Appetite also improved and she was able to tolerate eating prior to discharge.  Assessment and Plan: * Hypotension-resolved as of 01/26/2022 - Patient has not been eating in several days.  Does have known underlying component of dementia per son and became overtly confused over the past couple days -Poor appetite noted prior to admission.  This slowly improved after fluid rehydration and she tolerated eating well prior to discharge -Infectious etiologies ruled out.  Antibiotics discontinued  Acute metabolic encephalopathy-resolved as of 81/19/1478 Acute metabolic encephalopathy on background of dementia in setting of hypotension and hypovolemia.   No obvious infectious process at this time. - continue IVF - d/c abx -Patient back to baseline prior to discharge  PAF (paroxysmal atrial fibrillation) (HCC) -Continue Eliquis 2.5 mg BID  History of recurrent UTI (urinary tract infection) - Previously on chronic UTI suppression with trimethoprim which was changed to Keflex due to resistance on prior urine culture.  Appears that she has not been getting Keflex at her facility.  UA on admission is negative for UTI.  Hypothyroidism Continue Synthroid 150 mcg - TSH still supprssed (0.040); check FT4 (elevated but improving from 2 weeks ago) and TT3 (pending still) -Given recent adjustment of Synthroid, hold off on further adjustment and repeat thyroid studies in about 2-3 more weeks and if still abnormal, further decrease in Synthroid at that time  Type 2 diabetes mellitus (HCC) - Continue SSI and CBG monitoring  Dementia (HCC) - Delirium precautions - Continue fluids and monitoring mentation  Depression Continue Cymbalta.   Principal Diagnosis: Hypotension  Discharge Diagnoses: Active Hospital Problems   Diagnosis Date Noted   PAF (paroxysmal atrial fibrillation) ([place]) 01/13/2008    Priority: 3.   History of recurrent UTI (urinary tract infection)     Priority: 4.   Hypothyroidism 04/13/2007    Priority: 4.   Type 2 diabetes mellitus ([place]) 04/13/2007    Priority: 5.   Dementia ([place]) 03/10/2019    Priority: 6.   Depression 11/15/2021    Resolved Hospital Problems   Diagnosis Date Noted Date Resolved   Hypotension 01/25/2022 01/26/2022    Priority: 1.   Acute metabolic encephalopathy 29/56/2130 01/26/2022    Priority: 2.     Discharge Instructions  Diet general   Complete by: As directed    Increase activity slowly   Complete by: As directed       Allergies as of 01/26/2022       Reactions   Naproxen Sodium Other (See Comments)   Fever/aches and pains   Statins Other (See Comments)   myalgias    Sulfonamide Derivatives Hives, Itching   Not documented on MAR   Latex Itching, Rash   Shellfish Allergy Itching, Swelling, Rash   Seafood, shrimp        Medication List     STOP taking these medications    cephALEXin 250 MG capsule Commonly known as: KEFLEX   Lantus SoloStar 100 UNIT/ML Solostar Pen Generic drug: insulin glargine       TAKE these medications    B-D ULTRAFINE III SHORT PEN 31G X 8 MM Misc Generic drug: Insulin Pen Needle USE TO INJECT INSULIN ONCE DAILY. DX: E11.65   bisacodyl 10 MG suppository Commonly known as: DULCOLAX Place 10 mg rectally daily as needed for moderate constipation.   cyanocobalamin 2000 MCG tablet Take 1 tablet (2,000 mcg total) by mouth daily.   docusate sodium 100 MG capsule Commonly known as: COLACE Take 100 mg by mouth 2 (two) times daily.   DULoxetine 60 MG capsule Commonly known as: CYMBALTA Take 60 mg by mouth daily.   Eliquis 2.5 MG Tabs tablet Generic drug: apixaban Take 2.5 mg by mouth 2 (two) times daily.   Fish Oil 1000 MG Caps Take 1,000 mg by mouth daily.   HumaLOG KwikPen 100 UNIT/ML KwikPen Generic drug: insulin lispro Inject 2-12 Units into the skin in the morning, at noon, in the evening, and at bedtime. Per sliding scale: 140-199= 2 units, 200-250= 6 units, 251-299= 8 units, 300-349= 10 units, 350-400= 12 units,   Januvia 100 MG tablet Generic drug: sitaGLIPtin TAKE 1 TABLET BY MOUTH EVERY DAY What changed: how much to take   levothyroxine 150 MCG tablet Commonly known as: SYNTHROID Take 150 mcg by mouth daily.   magnesium hydroxide 400 MG/5ML suspension Commonly known as: MILK OF MAGNESIA Take 30 mLs by mouth daily as needed for mild constipation.   metFORMIN 1000 MG tablet Commonly known as: GLUCOPHAGE Take 1,000 mg by mouth 2 (two) times daily.   Nutritional Shake Plus Liqd Take 1 Container by mouth daily.   RA SALINE ENEMA RE Place 1 Dose rectally daily as needed (constipation).    Vitamin D3 1.25 MG (50000 UT) Caps Take 1 capsule by mouth once a week. What changed:  how much to take additional instructions        Allergies  Allergen Reactions   Naproxen Sodium Other (See Comments)    Fever/aches and pains   Statins Other (See Comments)    myalgias   Sulfonamide Derivatives Hives and Itching    Not documented on MAR   Latex Itching and Rash   Shellfish Allergy Itching, Swelling and Rash    Seafood, shrimp    Consultations:   Procedures:   Discharge Exam: BP 112/66 (BP Location: Right Arm)   Pulse 73   Temp 97.6 F (36.4 C) (Oral)   Resp 19   SpO2 99%  Physical Exam Constitutional:      Comments: Hard of hearing but now more awake and alert  HENT:     Head: Normocephalic and atraumatic.     Mouth/Throat:     Mouth: Mucous membranes are moist.  Eyes:     Extraocular Movements:  Extraocular movements intact.  Cardiovascular:     Rate and Rhythm: Normal rate and regular rhythm.  Pulmonary:     Effort: Pulmonary effort is normal.     Breath sounds: Normal breath sounds.  Abdominal:     General: Bowel sounds are normal. There is no distension.     Palpations: Abdomen is soft.     Tenderness: There is no abdominal tenderness.  Musculoskeletal:        General: Normal range of motion.     Cervical back: Normal range of motion.  Skin:    General: Skin is warm.  Neurological:     Comments: Underlying dementia appreciated.  Follows commands and moves all 4 extremities now      The results of significant diagnostics from this hospitalization (including imaging, microbiology, ancillary and laboratory) are listed below for reference.   Microbiology: Recent Results (from the past 240 hour(s))  Blood culture (routine x 2)     Status: None (Preliminary result)   Collection Time: 01/24/22  8:31 PM   Specimen: BLOOD  Result Value Ref Range Status   Specimen Description   Final    BLOOD LEFT ANTECUBITAL Performed at [facility] [address], [place], [place] [address]    Special Requests   Final    BOTTLES DRAWN AEROBIC AND ANAEROBIC Blood Culture adequate volume Performed at [facility] [address], [place], [place] [address]    Culture   Final    NO GROWTH 1 DAY Performed at [facility], [place] [address] [place], [place] [address]    Report Status PENDING  Incomplete  Urine Culture     Status: None (Preliminary result)   Collection Time: 01/24/22 11:00 PM   Specimen: Urine, Clean Catch  Result Value Ref Range Status   Specimen Description   Final    URINE, CLEAN CATCH Performed at [facility], [place] [address], [place], [place] [address]    Special Requests   Final    NONE Performed at [facility], [place] [address] [place], [place] [address]    Culture   Final    CULTURE REINCUBATED FOR BETTER GROWTH Performed at [facility], [place] [address] [place], [place] [address]    Report Status PENDING  Incomplete  Blood culture (routine x 2)     Status: None (Preliminary result)   Collection Time: 01/25/22  1:00 AM   Specimen: BLOOD  Result Value Ref Range Status   Specimen Description   Final    BLOOD BLOOD RIGHT HAND Performed at [facility] [address] [place], [place] [address]    Special Requests   Final    BOTTLES DRAWN AEROBIC AND ANAEROBIC Blood Culture adequate volume Performed at [facility] [address], [place], [place] [address]    Culture   Final    NO GROWTH 1 DAY Performed at [facility], [place] [address], [place], [place] [address]    Report Status PENDING  Incomplete  Resp Panel by RT-PCR (Flu A&B, Covid) Anterior Nasal Swab     Status: None   Collection Time: 01/25/22  2:49 AM   Specimen: Anterior Nasal Swab  Result Value Ref Range Status   SARS Coronavirus 2 by RT PCR NEGATIVE NEGATIVE Final    Comment: (NOTE) SARS-CoV-2 target nucleic  acids are NOT DETECTED.  The SARS-CoV-2 RNA is generally detectable in upper respiratory specimens during  the acute phase of infection. The lowest concentration of SARS-CoV-2 viral copies this assay can detect is 138 copies/mL. A negative result does not preclude SARS-Cov-2 infection and should not be used as the sole basis for treatment or other patient management decisions. A negative result may occur with  improper specimen collection/handling, submission of specimen other than nasopharyngeal swab, presence of viral mutation(s) within the areas targeted by this assay, and inadequate number of viral copies(<138 copies/mL). A negative result must be combined with clinical observations, patient history, and epidemiological information. The expected result is Negative.  Fact Sheet for Patients:  EntrepreneurPulse.com.au  Fact Sheet for Healthcare Providers:  IncredibleEmployment.be  This test is no t yet approved or cleared by the [place] FDA and  has been authorized for detection and/or diagnosis of SARS-CoV-2 by FDA under an Emergency Use Authorization (EUA). This EUA will remain  in effect (meaning this test can be used) for the duration of the COVID-19 declaration under Section 564(b)(1) of the Act, 21 U.S.C.section 360bbb-3(b)(1), unless the authorization is terminated  or revoked sooner.       Influenza A by PCR NEGATIVE NEGATIVE Final   Influenza B by PCR NEGATIVE NEGATIVE Final    Comment: (NOTE) The Xpert Xpress SARS-CoV-2/FLU/RSV plus assay is intended as an aid in the diagnosis of influenza from Nasopharyngeal swab specimens and should not be used as a sole basis for treatment. Nasal washings and aspirates are unacceptable for Xpert Xpress SARS-CoV-2/FLU/RSV testing.  Fact Sheet for Patients: EntrepreneurPulse.com.au  Fact Sheet for Healthcare Providers: IncredibleEmployment.be  This  test is not yet approved or cleared by the [place] FDA and has been authorized for detection and/or diagnosis of SARS-CoV-2 by FDA under an Emergency Use Authorization (EUA). This EUA will remain in effect (meaning this test can be used) for the duration of the COVID-19 declaration under Section 564(b)(1) of the Act, 21 U.S.C. section 360bbb-3(b)(1), unless the authorization is terminated or revoked.  Performed at [facility], [place] [address], [place], [place] [address]      Labs: BNP (last 3 results) No results for input(s): "BNP" in the last 8760 hours. Basic Metabolic Panel: Recent Labs  Lab 01/24/22 2031 01/25/22 0458  NA 141 139  K 4.5 3.7  CL 104 108  CO2 26 25  GLUCOSE 108* 144*  BUN 21 14  CREATININE 0.93 0.57  CALCIUM 9.2 8.4*   Liver Function Tests: Recent Labs  Lab 01/24/22 2031  AST 21  ALT 13  ALKPHOS 52  BILITOT 0.5  PROT 6.9  ALBUMIN 3.1*   No results for input(s): "LIPASE", "AMYLASE" in the last 168 hours. No results for input(s): "AMMONIA" in the last 168 hours. CBC: Recent Labs  Lab 01/24/22 2031 01/25/22 0458  WBC 10.3 8.0  NEUTROABS 6.3  --   HGB 11.5* 10.0*  HCT 36.2 31.6*  MCV 91.9 90.8  PLT 321 247   Cardiac Enzymes: No results for input(s): "CKTOTAL", "CKMB", "CKMBINDEX", "TROPONINI" in the last 168 hours. BNP: Invalid input(s): "POCBNP" CBG: Recent Labs  Lab 01/25/22 0729 01/25/22 1119 01/25/22 1657 01/25/22 2043 01/26/22 0757  GLUCAP 107* 174* 177* 235* 243*   D-Dimer No results for input(s): "DDIMER" in the last 72 hours. Hgb A1c No results for input(s): "HGBA1C" in the last 72 hours. Lipid Profile No results for input(s): "CHOL", "HDL", "LDLCALC", "TRIG", "CHOLHDL", "LDLDIRECT" in the last 72 hours. Thyroid function studies Recent Labs    01/25/22 0100  TSH 0.040*   Anemia  work up No results for input(s): "VITAMINB12", "FOLATE", "FERRITIN", "TIBC", "IRON", "RETICCTPCT" in the last 72  hours. Urinalysis    Component Value Date/Time   COLORURINE YELLOW 01/24/2022 2300   APPEARANCEUR HAZY (A) 01/24/2022 2300   LABSPEC 1.015 01/24/2022 2300   PHURINE 5.0 01/24/2022 2300   GLUCOSEU NEGATIVE 01/24/2022 2300   GLUCOSEU >=1000 (A) 03/16/2019 0915   HGBUR NEGATIVE 01/24/2022 2300   BILIRUBINUR NEGATIVE 01/24/2022 2300   BILIRUBINUR Negative 12/25/2020 1243   KETONESUR NEGATIVE 01/24/2022 2300   PROTEINUR NEGATIVE 01/24/2022 2300   UROBILINOGEN 0.2 12/25/2020 1243   UROBILINOGEN 0.2 03/16/2019 0915   NITRITE NEGATIVE 01/24/2022 2300   LEUKOCYTESUR NEGATIVE 01/24/2022 2300   Sepsis Labs Recent Labs  Lab 01/24/22 2031 01/25/22 0458  WBC 10.3 8.0   Microbiology Recent Results (from the past 240 hour(s))  Blood culture (routine x 2)     Status: None (Preliminary result)   Collection Time: 01/24/22  8:31 PM   Specimen: BLOOD  Result Value Ref Range Status   Specimen Description   Final    BLOOD LEFT ANTECUBITAL Performed at [facility], [place] [address], [place], [place] [address]    Special Requests   Final    BOTTLES DRAWN AEROBIC AND ANAEROBIC Blood Culture adequate volume Performed at [facility] [address] [place], [place] [address]    Culture   Final    NO GROWTH 1 DAY Performed at [facility], [place] [address] [name], [place] [address]    Report Status PENDING  Incomplete  Urine Culture     Status: None (Preliminary result)   Collection Time: 01/24/22 11:00 PM   Specimen: Urine, Clean Catch  Result Value Ref Range Status   Specimen Description   Final    URINE, CLEAN CATCH Performed at [facility], [place] [address] [place], [place] [address]    Special Requests   Final    NONE Performed at [facility], [place] [address], [place], [place] [address]    Culture   Final    CULTURE REINCUBATED FOR BETTER GROWTH Performed at [facility], [place] [address],  [place], [place] [address]    Report Status PENDING  Incomplete  Blood culture (routine x 2)     Status: None (Preliminary result)   Collection Time: 01/25/22  1:00 AM   Specimen: BLOOD  Result Value Ref Range Status   Specimen Description   Final    BLOOD BLOOD RIGHT HAND Performed at [facility] [address] [place], [place] [address]    Special Requests   Final    BOTTLES DRAWN AEROBIC AND ANAEROBIC Blood Culture adequate volume Performed at [facility] [address], [place], [place] [address]    Culture   Final    NO GROWTH 1 DAY Performed at [facility], [place] [address] [place], [place] [address]    Report Status PENDING  Incomplete  Resp Panel by RT-PCR (Flu A&B, Covid) Anterior Nasal Swab     Status: None   Collection Time: 01/25/22  2:49 AM   Specimen: Anterior Nasal Swab  Result Value Ref Range Status   SARS Coronavirus 2 by RT PCR NEGATIVE NEGATIVE Final    Comment: (NOTE) SARS-CoV-2 target nucleic acids are NOT DETECTED.  The SARS-CoV-2 RNA is generally detectable in upper respiratory specimens during the acute phase of infection. The lowest concentration of SARS-CoV-2 viral copies this assay  can detect is 138 copies/mL. A negative result does not preclude SARS-Cov-2 infection and should not be used as the sole basis for treatment or other patient management decisions. A negative result may occur with  improper specimen collection/handling, submission of specimen other than nasopharyngeal swab, presence of viral mutation(s) within the areas targeted by this assay, and inadequate number of viral copies(<138 copies/mL). A negative result must be combined with clinical observations, patient history, and epidemiological information. The expected result is Negative.  Fact Sheet for Patients:  EntrepreneurPulse.com.au  Fact Sheet for Healthcare Providers:   IncredibleEmployment.be  This test is no t yet approved or cleared by the [place] FDA and  has been authorized for detection and/or diagnosis of SARS-CoV-2 by FDA under an Emergency Use Authorization (EUA). This EUA will remain  in effect (meaning this test can be used) for the duration of the COVID-19 declaration under Section 564(b)(1) of the Act, 21 U.S.C.section 360bbb-3(b)(1), unless the authorization is terminated  or revoked sooner.       Influenza A by PCR NEGATIVE NEGATIVE Final   Influenza B by PCR NEGATIVE NEGATIVE Final    Comment: (NOTE) The Xpert Xpress SARS-CoV-2/FLU/RSV plus assay is intended as an aid in the diagnosis of influenza from Nasopharyngeal swab specimens and should not be used as a sole basis for treatment. Nasal washings and aspirates are unacceptable for Xpert Xpress SARS-CoV-2/FLU/RSV testing.  Fact Sheet for Patients: EntrepreneurPulse.com.au  Fact Sheet for Healthcare Providers: IncredibleEmployment.be  This test is not yet approved or cleared by the [place] FDA and has been authorized for detection and/or diagnosis of SARS-CoV-2 by FDA under an Emergency Use Authorization (EUA). This EUA will remain in effect (meaning this test can be used) for the duration of the COVID-19 declaration under Section 564(b)(1) of the Act, 21 U.S.C. section 360bbb-3(b)(1), unless the authorization is terminated or revoked.  Performed at [facility], [place] [address] [place], [place] [address]     Procedures/Studies: DG Chest Portable 1 View  Result Date: 01/24/2022 CLINICAL DATA:  Altered level of consciousness, decreased appetite and decreased urine output EXAM: PORTABLE CHEST 1 VIEW COMPARISON:  11/15/2021 FINDINGS: Single frontal view of the chest demonstrates an unremarkable cardiac silhouette. No airspace disease, effusion, or pneumothorax. No acute bony abnormalities.  IMPRESSION: 1. No acute intrathoracic process. Electronically Signed   By: Randa Ngo M.D.   On: 01/24/2022 21:19   Overnight EEG with video  Result Date: 01/11/2022 [name], MD     01/12/2022  9:14 AM Patient Name: Jaclyn [name] MRN: [id number] Epilepsy Attending: [name] Referring Physician/Provider: Greta Doom, MD Duration: 01/10/2022 1605 to 01/11/2022 1605  Patient history: 86yo F presented with confusion and not answering questions correctly. EEG to evaluate for seizure  Level of alertness: Awake, asleep  AEDs during EEG study: None  Technical aspects: This EEG study was done with scalp electrodes positioned according to the 10-20 International system of electrode placement. Electrical activity was reviewed with band pass filter of 1-'70Hz'$ , sensitivity of 7 uV/mm, display speed of 79m/sec with a '60Hz'$  notched filter applied as appropriate. EEG data were recorded continuously and digitally stored.  Video monitoring was available and reviewed as appropriate.  Description: The posterior dominant rhythm consists of 8 Hz activity of moderate voltage (25-35 uV) seen predominantly in posterior head regions, symmetric and reactive to eye opening and eye closing. Sleep was characterized by sleep spindles (12-'14hz'$ ), maximal fronto-central region. EEG showed intermittent generalized and maximal left  temporal 3 to 6 Hz theta-delta slowing. Hyperventilation and photic stimulation were not performed.    ABNORMALITY - Intermittent slow, generalized and maximal left temporal region  IMPRESSION: This study is suggestive of cortical dysfunction arising from left temporal region, nonspecific etiology. Additionally there is mild diffuse encephalopathy, nonspecific etiology. No seizures or epileptiform discharges were seen throughout the recording.  [name]   ECHOCARDIOGRAM COMPLETE  Result Date: 01/10/2022    ECHOCARDIOGRAM REPORT   Patient Name:   Jaclyn [name] Date of  Exam: 01/10/2022 Medical Rec #:  [id number]       Height:       66.0 in Accession #:    [id number]      Weight:       131.4 lb Date of Birth:  February 04, 1936       BSA:          1.673 m Patient Age:    12 years        BP:           138/73 mmHg Patient Gender: F               HR:           103 bpm. Exam Location:  Inpatient Procedure: 2D Echo Indications:    TIA  History:        Patient has prior history of Echocardiogram examinations, most                 recent 08/22/2019. Arrythmias:Paroxysmal atrial fibrillation; Risk                 Factors:Diabetes and Hypertension.  Sonographer:    Johny Chess RDCS Referring Phys: [phone] [place] Comments: Image acquisition challenging due to uncooperative patient. IMPRESSIONS  1. Left ventricular ejection fraction, by estimation, is 65 to 70%. The left ventricle has normal function. The left ventricle has no regional wall motion abnormalities. There is mild concentric left ventricular hypertrophy. Left ventricular diastolic parameters are consistent with Grade I diastolic dysfunction (impaired relaxation).  2. Right ventricular systolic function is hyperdynamic. The right ventricular size is normal. Tricuspid regurgitation signal is inadequate for assessing PA pressure.  3. A small pericardial effusion is present. The pericardial effusion is anterior to the right ventricle. There is no evidence of cardiac tamponade.  4. The mitral valve was not well visualized. No evidence of mitral valve regurgitation. No evidence of mitral stenosis.  5. The aortic valve was not well visualized. Aortic valve regurgitation is not visualized. Aortic valve sclerosis is present, with no evidence of aortic valve stenosis.  6. The inferior vena cava is normal in size with greater than 50% respiratory variability, suggesting right atrial pressure of 3 mmHg. Comparison(s): No significant change from prior study. FINDINGS  Left Ventricle: Left ventricular ejection fraction, by  estimation, is 65 to 70%. The left ventricle has normal function. The left ventricle has no regional wall motion abnormalities. The left ventricular internal cavity size was small. There is mild concentric left ventricular hypertrophy. Left ventricular diastolic parameters are consistent with Grade I diastolic dysfunction (impaired relaxation). Right Ventricle: The right ventricular size is normal. Right vetricular wall thickness was not well visualized. Right ventricular systolic function is hyperdynamic. Tricuspid regurgitation signal is inadequate for assessing PA pressure. Left Atrium: Left atrial size was normal in size. Right Atrium: Right atrial size was normal in size. Pericardium: A small pericardial effusion is present. The pericardial effusion is anterior to the right ventricle.  There is no evidence of cardiac tamponade. Presence of epicardial fat layer. Mitral Valve: The mitral valve was not well visualized. Mild mitral annular calcification. No evidence of mitral valve regurgitation. No evidence of mitral valve stenosis. Tricuspid Valve: The tricuspid valve is normal in structure. Tricuspid valve regurgitation is not demonstrated. No evidence of tricuspid stenosis. Aortic Valve: The aortic valve was not well visualized. Aortic valve regurgitation is not visualized. Aortic valve sclerosis is present, with no evidence of aortic valve stenosis. Pulmonic Valve: The pulmonic valve was not well visualized. Pulmonic valve regurgitation is not visualized. No evidence of pulmonic stenosis. Aorta: The aortic root and ascending aorta are structurally normal, with no evidence of dilitation. Venous: The inferior vena cava is normal in size with greater than 50% respiratory variability, suggesting right atrial pressure of 3 mmHg. IAS/Shunts: No atrial level shunt detected by color flow Doppler.  LEFT VENTRICLE PLAX 2D LVIDd:         3.20 cm   Diastology LVIDs:         2.10 cm   LV e' medial:    5.00 cm/s LV PW:          1.00 cm   LV E/e' medial:  12.7 LV IVS:        0.90 cm   LV e' lateral:   5.77 cm/s LVOT diam:     1.70 cm   LV E/e' lateral: 11.0 LVOT Area:     2.27 cm  RIGHT VENTRICLE             IVC RV S prime:     16.30 cm/s  IVC diam: 1.20 cm TAPSE (M-mode): 0.5 cm LEFT ATRIUM             Index LA diam:        2.80 cm 1.67 cm/m LA Vol (A2C):   19.3 ml 11.54 ml/m LA Vol (A4C):   26.9 ml 16.08 ml/m LA Biplane Vol: 24.1 ml 14.41 ml/m   AORTA Ao Root diam: 2.70 cm Ao Asc diam:  2.80 cm MITRAL VALVE MV Area (PHT): 5.38 cm     SHUNTS MV Decel Time: 141 msec     Systemic Diam: 1.70 cm MV E velocity: 63.60 cm/s MV A velocity: 103.00 cm/s MV E/A ratio:  0.62 Rudean Haskell MD Electronically signed by Rudean Haskell MD Signature Date/Time: 01/10/2022/12:18:48 PM    Final    EEG adult  Result Date: 01/10/2022 [name], MD     01/10/2022  8:02 AM Patient Name: Jaclyn [name] MRN: [id number] Epilepsy Attending: [name] Referring Physician/Provider: Donnetta Simpers, MD Date: 01/10/2022 Duration: 23.31 mins Patient history: 86yo F presented with confusion and not answering questions correctly. EEG to evaluate for seizure Level of alertness: Awake AEDs during EEG study: None Technical aspects: This EEG study was done with scalp electrodes positioned according to the 10-20 International system of electrode placement. Electrical activity was reviewed with band pass filter of 1-'70Hz'$ , sensitivity of 7 uV/mm, display speed of 34m/sec with a '60Hz'$  notched filter applied as appropriate. EEG data were recorded continuously and digitally stored.  Video monitoring was available and reviewed as appropriate. Description: The posterior dominant rhythm consists of 8 Hz activity of moderate voltage (25-35 uV) seen predominantly in posterior head regions, symmetric and reactive to eye opening and eye closing. EEG showed intermittent generalized and maximal left temporal 3 to 6 Hz theta-delta slowing.  Hyperventilation and photic stimulation were not performed.   ABNORMALITY - Intermittent slow,  generalized and maximal left temporal region IMPRESSION: This study is suggestive of cortical dysfunction arising from left temporal region, nonspecific etiology. Additionally there is mild diffuse encephalopathy, nonspecific etiology. No seizures or epileptiform discharges were seen throughout the recording. [name]   MR BRAIN WO CONTRAST  Result Date: 01/10/2022 CLINICAL DATA:  Altered mental status, confusion EXAM: MRI HEAD WITHOUT CONTRAST TECHNIQUE: Multiplanar, multiecho pulse sequences of the brain and surrounding structures were obtained without intravenous contrast. COMPARISON:  02/20/2021 MRI head, correlation is also made with 01/09/2022 CT head FINDINGS: Brain: No restricted diffusion to suggest acute or subacute infarct. No acute hemorrhage, mass, mass effect, or midline shift. Ventriculomegaly, which reflects central parenchymal volume loss is commensurate with sulcal size. No hydrocephalus or extra-axial collection. No hemosiderin deposition to suggest remote hemorrhage. Confluent T2 hyperintense signal in the periventricular white matter and pons, likely the sequela of severe chronic small vessel ischemic disease. Vascular: Normal arterial flow voids. Skull and upper cervical spine: Normal marrow signal. Sinuses/Orbits: Air-fluid level in the right sphenoid sinus. Status post left lens replacement. Other: None. IMPRESSION: 1. No acute intracranial process. No evidence of acute or subacute infarct. 2. Air-fluid level in the right sphenoid sinus, which is nonspecific but can be seen in the setting of acute sinusitis. Electronically Signed   By: Merilyn Baba M.D.   On: 01/10/2022 00:02   CT HEAD CODE STROKE WO CONTRAST  Result Date: 01/09/2022 CLINICAL DATA:  Code stroke. Altered mental status, aphasia, right-sided facial droop EXAM: CT HEAD WITHOUT CONTRAST TECHNIQUE: Contiguous axial  images were obtained from the base of the skull through the vertex without intravenous contrast. RADIATION DOSE REDUCTION: This exam was performed according to the departmental dose-optimization program which includes automated exposure control, adjustment of the mA and/or kV according to patient size and/or use of iterative reconstruction technique. COMPARISON:  11/15/2021 FINDINGS: Brain: No evidence of acute infarction, hemorrhage, cerebral edema, mass, mass effect, or midline shift. No hydrocephalus or extra-axial collection. Periventricular white matter changes, likely the sequela of chronic small vessel ischemic disease. Vascular: No hyperdense vessel. Atherosclerotic calcifications in the intracranial carotid and vertebral arteries. Skull: Negative for fracture or focal lesion. Sinuses/Orbits: Status post left lens replacement. Clear paranasal sinuses. Other: None. ASPECTS [facility] Stroke Program Early CT Score) - Ganglionic level infarction (caudate, lentiform nuclei, internal capsule, insula, M1-M3 cortex): 7 - Supraganglionic infarction (M4-M6 cortex): 3 Total score (0-10 with 10 being normal): 10 IMPRESSION: 1. No acute intracranial process. 2. ASPECTS is 10. Code stroke imaging results were communicated on 01/09/2022 at 10:13 pm to provider Dr. Lorrin Goodell via secure text paging. Electronically Signed   By: Merilyn Baba M.D.   On: 01/09/2022 22:13     Time coordinating discharge: Over 30 minutes    Dwyane Dee, MD  [facility] 01/26/2022, 11:09 AM

## 2022-01-26 NOTE — Progress Notes (Signed)
Pt can return to [employer] today.  CSW spoke with [employer] this morning.  Call Report # is  [phone], ask for RN on [place].  This information has also been shared with nurse.  Jorah Hua Tarpley-Carter, MSW, LCSW-A Pronouns:  She/Her/Hers [facility]Transitions of Care Clinical Social Worker Direct Number:  [phone] [email or web address]

## 2022-01-27 LAB — URINE CULTURE: Culture: >=100000 — AB

## 2022-01-27 LAB — T3: T3, Total: 83 ng/dL (ref 71–180)

## 2022-01-30 LAB — CULTURE, BLOOD (ROUTINE X 2)
Culture: NO GROWTH
Culture: NO GROWTH
Special Requests: ADEQUATE
Special Requests: ADEQUATE

## 2022-02-03 DIAGNOSIS — M6281 Muscle weakness (generalized): Secondary | ICD-10-CM | POA: Diagnosis not present

## 2022-02-07 DIAGNOSIS — E039 Hypothyroidism, unspecified: Secondary | ICD-10-CM | POA: Diagnosis not present

## 2022-02-25 DIAGNOSIS — E039 Hypothyroidism, unspecified: Secondary | ICD-10-CM | POA: Diagnosis not present

## 2022-02-25 DIAGNOSIS — E785 Hyperlipidemia, unspecified: Secondary | ICD-10-CM | POA: Diagnosis not present

## 2022-02-25 DIAGNOSIS — E118 Type 2 diabetes mellitus with unspecified complications: Secondary | ICD-10-CM | POA: Diagnosis not present

## 2022-02-25 DIAGNOSIS — M6281 Muscle weakness (generalized): Secondary | ICD-10-CM | POA: Diagnosis not present

## 2022-03-06 DIAGNOSIS — M6281 Muscle weakness (generalized): Secondary | ICD-10-CM | POA: Diagnosis not present

## 2022-03-21 DIAGNOSIS — I10 Essential (primary) hypertension: Secondary | ICD-10-CM | POA: Diagnosis not present

## 2022-03-21 DIAGNOSIS — E118 Type 2 diabetes mellitus with unspecified complications: Secondary | ICD-10-CM | POA: Diagnosis not present

## 2022-03-21 DIAGNOSIS — E039 Hypothyroidism, unspecified: Secondary | ICD-10-CM | POA: Diagnosis not present

## 2022-03-21 DIAGNOSIS — I4891 Unspecified atrial fibrillation: Secondary | ICD-10-CM | POA: Diagnosis not present

## 2022-03-25 DIAGNOSIS — M797 Fibromyalgia: Secondary | ICD-10-CM | POA: Diagnosis not present

## 2022-03-25 DIAGNOSIS — M6281 Muscle weakness (generalized): Secondary | ICD-10-CM | POA: Diagnosis not present

## 2022-03-25 DIAGNOSIS — R1312 Dysphagia, oropharyngeal phase: Secondary | ICD-10-CM | POA: Diagnosis not present

## 2022-03-27 DIAGNOSIS — R1312 Dysphagia, oropharyngeal phase: Secondary | ICD-10-CM | POA: Diagnosis not present

## 2022-03-27 DIAGNOSIS — M6281 Muscle weakness (generalized): Secondary | ICD-10-CM | POA: Diagnosis not present

## 2022-03-27 DIAGNOSIS — M797 Fibromyalgia: Secondary | ICD-10-CM | POA: Diagnosis not present

## 2022-03-28 DIAGNOSIS — E118 Type 2 diabetes mellitus with unspecified complications: Secondary | ICD-10-CM | POA: Diagnosis not present

## 2022-03-28 DIAGNOSIS — M6281 Muscle weakness (generalized): Secondary | ICD-10-CM | POA: Diagnosis not present

## 2022-03-28 DIAGNOSIS — R1312 Dysphagia, oropharyngeal phase: Secondary | ICD-10-CM | POA: Diagnosis not present

## 2022-03-28 DIAGNOSIS — M797 Fibromyalgia: Secondary | ICD-10-CM | POA: Diagnosis not present

## 2022-03-29 DIAGNOSIS — M797 Fibromyalgia: Secondary | ICD-10-CM | POA: Diagnosis not present

## 2022-03-29 DIAGNOSIS — M6281 Muscle weakness (generalized): Secondary | ICD-10-CM | POA: Diagnosis not present

## 2022-03-29 DIAGNOSIS — R1312 Dysphagia, oropharyngeal phase: Secondary | ICD-10-CM | POA: Diagnosis not present

## 2022-04-01 ENCOUNTER — Ambulatory Visit (HOSPITAL_COMMUNITY)
Admission: RE | Admit: 2022-04-01 | Discharge: 2022-04-01 | Disposition: A | Discharge status: Home / Self Care | Payer: Medicare Other | Source: Ambulatory Visit | Attending: Cardiovascular Disease | Admitting: Cardiovascular Disease

## 2022-04-01 DIAGNOSIS — I63239 Cerebral infarction due to unspecified occlusion or stenosis of unspecified carotid arteries: Secondary | ICD-10-CM | POA: Insufficient documentation

## 2022-04-02 ENCOUNTER — Telehealth: Payer: Self-pay | Admitting: Cardiovascular Disease

## 2022-04-02 DIAGNOSIS — R9439 Abnormal result of other cardiovascular function study: Secondary | ICD-10-CM

## 2022-04-02 DIAGNOSIS — R1312 Dysphagia, oropharyngeal phase: Secondary | ICD-10-CM | POA: Diagnosis not present

## 2022-04-02 DIAGNOSIS — M79602 Pain in left arm: Secondary | ICD-10-CM

## 2022-04-02 DIAGNOSIS — M797 Fibromyalgia: Secondary | ICD-10-CM | POA: Diagnosis not present

## 2022-04-02 DIAGNOSIS — M6281 Muscle weakness (generalized): Secondary | ICD-10-CM | POA: Diagnosis not present

## 2022-04-02 NOTE — Telephone Encounter (Signed)
Josue Hector, MD  Michaelyn Barter, RN Carotids fine left subclavian obstructed If she is having any left arm symptoms can refer to [facility]  Called patient's son back with results. Put in referral for patient since she has been having left arm pain. Patient's son verbalized understanding.

## 2022-04-02 NOTE — Telephone Encounter (Signed)
Patient's son is requesting patient's carotid results. Please advise.

## 2022-04-08 DIAGNOSIS — R1312 Dysphagia, oropharyngeal phase: Secondary | ICD-10-CM | POA: Diagnosis not present

## 2022-04-08 DIAGNOSIS — M6281 Muscle weakness (generalized): Secondary | ICD-10-CM | POA: Diagnosis not present

## 2022-04-08 DIAGNOSIS — M797 Fibromyalgia: Secondary | ICD-10-CM | POA: Diagnosis not present

## 2022-04-09 DIAGNOSIS — R1312 Dysphagia, oropharyngeal phase: Secondary | ICD-10-CM | POA: Diagnosis not present

## 2022-04-09 DIAGNOSIS — M6281 Muscle weakness (generalized): Secondary | ICD-10-CM | POA: Diagnosis not present

## 2022-04-09 DIAGNOSIS — M797 Fibromyalgia: Secondary | ICD-10-CM | POA: Diagnosis not present

## 2022-04-16 ENCOUNTER — Ambulatory Visit (INDEPENDENT_AMBULATORY_CARE_PROVIDER_SITE_OTHER): Payer: Medicare Other | Admitting: Vascular Surgery

## 2022-04-16 ENCOUNTER — Encounter: Payer: Self-pay | Admitting: Vascular Surgery

## 2022-04-16 VITALS — BP 106/72 | HR 94 | Temp 97.9°F | Resp 20 | Ht 66.0 in | Wt 131.0 lb

## 2022-04-16 DIAGNOSIS — I771 Stricture of artery: Secondary | ICD-10-CM | POA: Diagnosis not present

## 2022-04-16 DIAGNOSIS — R1312 Dysphagia, oropharyngeal phase: Secondary | ICD-10-CM | POA: Diagnosis not present

## 2022-04-16 DIAGNOSIS — M797 Fibromyalgia: Secondary | ICD-10-CM | POA: Diagnosis not present

## 2022-04-16 DIAGNOSIS — M6281 Muscle weakness (generalized): Secondary | ICD-10-CM | POA: Diagnosis not present

## 2022-04-16 NOTE — Progress Notes (Signed)
Patient ID: Jaclyn [name], female   DOB: February 09, 1936, 87 y.o.   MRN: [id number]  Reason for Consult: Follow-up   Referred by Jinny Sanders, MD  Subjective:     HPI:  Jaclyn [name] is a 87 y.o. female with history of right CEA for symptomatic high-grade stenosis.  She is living in a nursing facility and has difficulty hearing as well as expressing herself.  Currently in a wheelchair and has not walked for the past year.  She is on Eliquis she does have an allergy to statins.  She has a known stenosis of the left subclavian artery.  She states that she has left arm as well as entire left-sided pain.  She denies any tissue loss or ulceration.  She does not have any previous upper extremity vascular interventions or injuries.  Past Medical History:  Diagnosis Date   Atrial fibrillation ([place])    Benign neoplasm of colon    Carotid artery occlusion    60-79% right ICA stenosis   Difficult intubation 02/17/2006   During surgery to remove large polyp   Diverticulosis of colon (without mention of hemorrhage)    Dysthymic disorder    Fibromyalgia    Headache(784.0)    Insomnia, unspecified    Interstitial cystitis    Osteoarthrosis, unspecified whether generalized or localized, unspecified site    Other and unspecified hyperlipidemia    Other chest pain    Other specified benign mammary dysplasias    TIA (transient ischemic attack)    Type II or unspecified type diabetes mellitus without mention of complication, not stated as uncontrolled    Unspecified essential hypertension    Unspecified hypothyroidism    Unspecified vitamin D deficiency    Urinary tract infection, site not specified    Family History  Problem Relation Age of Onset   Lymphoma Father    Hypertension Father    Stroke Father    Hyperlipidemia Father    Hypertension Mother    Allergies Brother    Hypertension Sister        3   Breast cancer Sister    Fibromyalgia Sister        3   Hyperlipidemia Sister         3   Past Surgical History:  Procedure Laterality Date   ABDOMINAL HYSTERECTOMY  1988   cervical dysplasia   CARDIAC CATHETERIZATION  02/19/06   EF 60%   COLONOSCOPY     ENDARTERECTOMY Right 08/26/2019   Procedure: RIGHT CAROTID ENDARTERECTOMY;  Surgeon: Rosetta Posner, MD;  Location: [facility] OR;  Service: Vascular;  Laterality: Right;   PATCH ANGIOPLASTY Right 08/26/2019   Procedure: PATCH ANGIOPLASTY OF RIGHT COMMON CAROTID ARTERY USING HEMASHIELD PLATINUM FINESSE PATCH;  Surgeon: Rosetta Posner, MD;  Location: [facility] OR;  Service: Vascular;  Laterality: Right;   RIGHT COLECTOMY  2005   for villous adenoma of the cecum Dr.streck   VESICOVAGINAL FISTULA CLOSURE W/ TAH  1990   w/ cystocele &retocele repairs Dr. March Rummage Social History:  Social History   Tobacco Use   Smoking status: Never   Smokeless tobacco: Never  Substance Use Topics   Alcohol use: No    Alcohol/week: 0.0 standard drinks of alcohol    Allergies  Allergen Reactions   Naproxen Sodium Other (See Comments)    Fever/aches and pains   Statins Other (See Comments)    myalgias   Sulfonamide Derivatives Hives and Itching    Not documented on  MAR   Latex Itching and Rash   Shellfish Allergy Itching, Swelling and Rash    Seafood, shrimp    Current Outpatient Medications  Medication Sig Dispense Refill   B-D ULTRAFINE III SHORT PEN 31G X 8 MM MISC USE TO INJECT INSULIN ONCE DAILY. DX: E11.65 100 each 3   bisacodyl (DULCOLAX) 10 MG suppository Place 10 mg rectally daily as needed for moderate constipation.     Cholecalciferol (VITAMIN D3) 1.25 MG (50000 UT) CAPS Take 1 capsule by mouth once a week. (Patient taking differently: Take 50,000 Units by mouth once a week. Monday) 12 capsule 0   docusate sodium (COLACE) 100 MG capsule Take 100 mg by mouth 2 (two) times daily.     DULoxetine (CYMBALTA) 60 MG capsule Take 60 mg by mouth daily.     ELIQUIS 2.5 MG TABS tablet Take 2.5 mg by mouth 2 (two) times daily.      HUMALOG KWIKPEN 100 UNIT/ML KwikPen Inject 2-12 Units into the skin in the morning, at noon, in the evening, and at bedtime. Per sliding scale: 140-199= 2 units, 200-250= 6 units, 251-299= 8 units, 300-349= 10 units, 350-400= 12 units,     JANUVIA 100 MG tablet TAKE 1 TABLET BY MOUTH EVERY DAY (Patient taking differently: Take 100 mg by mouth daily.) 30 tablet 5   levothyroxine (SYNTHROID) 150 MCG tablet Take 150 mcg by mouth daily.     magnesium hydroxide (MILK OF MAGNESIA) 400 MG/5ML suspension Take 30 mLs by mouth daily as needed for mild constipation.     metFORMIN (GLUCOPHAGE) 1000 MG tablet Take 1,000 mg by mouth 2 (two) times daily.     Nutritional Supplements (NUTRITIONAL SHAKE PLUS) LIQD Take 1 Container by mouth daily.     Omega-3 Fatty Acids (FISH OIL) 1000 MG CAPS Take 1,000 mg by mouth daily.     Sodium Phosphates (RA SALINE ENEMA RE) Place 1 Dose rectally daily as needed (constipation).     No current facility-administered medications for this visit.    Review of Systems  Constitutional:  Constitutional negative. HENT: HENT negative.  Eyes: Eyes negative.  Respiratory: Respiratory negative.  Cardiovascular: Cardiovascular negative.  GI: Gastrointestinal negative.  Musculoskeletal: Positive for gait problem.       Left arm pain Skin: Skin negative.  Hematologic: Hematologic/lymphatic negative.  Psychiatric: Psychiatric negative.        Objective:  Objective  Vitals:   04/16/22 1105  BP: 106/72  Pulse: 94  Resp: 20  Temp: 97.9 F (36.6 C)  SpO2: 95%     Physical Exam HENT:     Head: Normocephalic.     Nose: Nose normal.  Eyes:     Pupils: Pupils are equal, round, and reactive to light.  Neck:     Vascular: No carotid bruit.  Cardiovascular:     Rate and Rhythm: Normal rate.     Pulses:          Radial pulses are 2+ on the right side and 0 on the left side.  Abdominal:     General: Abdomen is flat.     Palpations: Abdomen is soft.  Musculoskeletal:      Right lower leg: No edema.     Left lower leg: No edema.  Skin:    General: Skin is warm.     Capillary Refill: Capillary refill takes less than 2 seconds.  Neurological:     General: No focal deficit present.     Mental Status: She is alert.  Psychiatric:        Mood and Affect: Mood normal.        Behavior: Behavior normal.        Thought Content: Thought content normal.        Judgment: Judgment normal.     Data: Right Carotid Findings:  +----------+--------+--------+--------+------------------+-----------------  -+           PSV cm/sEDV cm/sStenosisPlaque DescriptionComments             +----------+--------+--------+--------+------------------+-----------------  -+  CCA Prox  88      13                                                     +----------+--------+--------+--------+------------------+-----------------  -+  CCA Distal57      13                                intimal  thickening  +----------+--------+--------+--------+------------------+-----------------  -+  ICA Prox  70      13                                S/P CEA              +----------+--------+--------+--------+------------------+-----------------  -+  ICA Mid   85      33                                                     +----------+--------+--------+--------+------------------+-----------------  -+  ICA Distal82      33                                                     +----------+--------+--------+--------+------------------+-----------------  -+  ECA      88      0                                                      +----------+--------+--------+--------+------------------+-----------------  -+   +----------+--------+-------+----------------+-------------------+           PSV cm/sEDV cmsDescribe        Arm Pressure (mmHG)  +----------+--------+-------+----------------+-------------------+  Subclavian130           Multiphasic,  [id number]                  +----------+--------+-------+----------------+-------------------+   +---------+--------+--+--------+-+----------------------------+  VertebralPSV cm/s52EDV cm/s7Antegrade and High resistant  +---------+--------+--+--------+-+----------------------------+   Brachial artery antecubital fossa, 102 cm/s, biphasic flow.   Left Carotid Findings:  +----------+--------+--------+--------+-----------------------------+------  --+           PSV cm/sEDV cm/sStenosisPlaque Description            Comments  +----------+--------+--------+--------+-----------------------------+------  --+  CCA Prox  91      14  focal and calcific                      +----------+--------+--------+--------+-----------------------------+------  --+  CCA Distal70      14                                                      +----------+--------+--------+--------+-----------------------------+------  --+  ICA Prox  74      29      1-39%   focal, irregular and calcific           +----------+--------+--------+--------+-----------------------------+------  --+  ICA Mid   77      32                                                      +----------+--------+--------+--------+-----------------------------+------  --+  ICA Distal52      24                                                      +----------+--------+--------+--------+-----------------------------+------  --+  ECA      126     6                                                       +----------+--------+--------+--------+-----------------------------+------  --+   +----------+--------+--------+-------------------------+-------------------  ----+           PSV cm/sEDV cm/sDescribe                 Arm Pressure  (mmHG)      +----------+--------+--------+-------------------------+-------------------  ----+  [id number]             decreased monophasic  flowNot detectable  with                                                         manual or  automatic                                                         machines                  +----------+--------+--------+-------------------------+-------------------  ----+   +---------+--------+--------+-------------------------------------+  VertebralPSV cm/sEDV cm/sNot identified and probable occlusion  +---------+--------+--------+-------------------------------------+   Brachial artery antecubital fossa, 30 cm/s, monophasic flow. Unable to  duplicate left ICA velocities from prior exam.      Summary:  Right Carotid: The extracranial vessels were near-normal with only minimal  wall  thickening or plaque. Patent CCA and ICA, s/p CEA.   Left Carotid: Velocities in the left ICA are consistent with a 1-39%  stenosis.   Vertebrals: Right vertebral artery demonstrates high resistant flow. Left               vertebral artery demonstrates no discernable flow. Left  vertebral              artery demonstrates an occlusion.  Subclavians: Normal flow hemodynamics were seen in the right subclavian  artery.              Left subclavian artery has decreased flow with monophasic               waveforms. SEE CT ANGIO NECK FROM 09/07/2019.      Assessment/Plan:    87 year old female with a history of a right CEA for symptomatic disease now almost 3 years ago.  At the time of her original workup she was found to have at least high-grade stenosis of the left radial artery and likely occlusion.  This is now demonstrated on recent duplex of her carotid arteries.  Blood pressure on the left reads lower than the right but she is asymptomatic from an occlusive arterial standpoint.  For this reason she will remain on Eliquis and given recent CVA will follow-up in 1 year with repeat duplex.  All questions from patient and her son were answered today.     Waynetta Sandy MD [facility]

## 2022-04-17 DIAGNOSIS — M797 Fibromyalgia: Secondary | ICD-10-CM | POA: Diagnosis not present

## 2022-04-17 DIAGNOSIS — M6281 Muscle weakness (generalized): Secondary | ICD-10-CM | POA: Diagnosis not present

## 2022-04-17 DIAGNOSIS — R1312 Dysphagia, oropharyngeal phase: Secondary | ICD-10-CM | POA: Diagnosis not present

## 2022-04-17 NOTE — Progress Notes (Deleted)
Cardiology Office Note:    Date:  04/17/2022   ID:  Jaclyn [name], DOB 01/27/1936, MRN [id number]  PCP:  Jinny Sanders, MD   [facility] Providers Cardiologist:  Jenkins Rouge, MD { Click to update primary MD,subspecialty MD or APP then REFRESH:1}    Referring MD: Jinny Sanders, MD   Chief Complaint: ***  History of Present Illness:    Jaclyn [name] is a *** 87 y.o. female with a hx of HTN, HLD, diabetes, PAF, TIA, carotid artery disease  Initially referred to cardiology for management of PAF. Did not follow-up regularly. History of carotid artery disease. Initially refused CEA but eventually had right CEA with Dr. Donnetta Hutching 08/26/2019. She had left 40 to 59% carotid artery disease that has not been followed.  Hospital admission 02/15/2021 with failure to thrive at SNF with urinary incontinence, confusion, worsening dementia, and poor p.o. intake. Noted to have UTI. TS H22, blood sugar 336. MRI head nonacute.  Last cardiology clinic visit was 04/01/21 with [name]. Per record review, she had severe dementia and did not give clear history of past or present illness. Her son was with her but did not provide much assistance with recent symptoms/history. Sees her once/month at SNF. She was in NSR, Eliquis continued. Advised to f/u with cardiology PRN.   Carotid duplex 04/01/22 revealed 1 to 39% stenosis left carotid, near normal right carotid, left subclavian obstruction. Advised by [name] to repeat in 2 years. She reported left arm pain and was referred to [facility] for management of obstructed left subclavian.   Today, she is here   Past Medical History:  Diagnosis Date   Atrial fibrillation ([place])    Benign neoplasm of colon    Carotid artery occlusion    60-79% right ICA stenosis   Difficult intubation 02/17/2006   During surgery to remove large polyp   Diverticulosis of colon (without mention of hemorrhage)    Dysthymic disorder    Fibromyalgia    Headache(784.0)     Insomnia, unspecified    Interstitial cystitis    Osteoarthrosis, unspecified whether generalized or localized, unspecified site    Other and unspecified hyperlipidemia    Other chest pain    Other specified benign mammary dysplasias    TIA (transient ischemic attack)    Type II or unspecified type diabetes mellitus without mention of complication, not stated as uncontrolled    Unspecified essential hypertension    Unspecified hypothyroidism    Unspecified vitamin D deficiency    Urinary tract infection, site not specified     Past Surgical History:  Procedure Laterality Date   ABDOMINAL HYSTERECTOMY  1988   cervical dysplasia   CARDIAC CATHETERIZATION  02/19/06   EF 60%   COLONOSCOPY     ENDARTERECTOMY Right 08/26/2019   Procedure: RIGHT CAROTID ENDARTERECTOMY;  Surgeon: Rosetta Posner, MD;  Location: [facility];  Service: Vascular;  Laterality: Right;   PATCH ANGIOPLASTY Right 08/26/2019   Procedure: PATCH ANGIOPLASTY OF RIGHT COMMON CAROTID ARTERY USING HEMASHIELD PLATINUM [place];  Surgeon: Rosetta Posner, MD;  Location: [facility];  Service: Vascular;  Laterality: Right;   RIGHT COLECTOMY  2005   for villous adenoma of the cecum Dr.streck   VESICOVAGINAL FISTULA CLOSURE W/ TAH  1990   w/ cystocele &retocele repairs Dr. Ree Edman    Current Medications: No outpatient medications have been marked as taking for the 04/22/22 encounter (Appointment) with Emmaline Life, NP.     Allergies:  Naproxen sodium, Statins, Sulfonamide derivatives, Latex, and Shellfish allergy   Social History   Socioeconomic History   Marital status: Single    Spouse name: Not on file   Number of children: 3   Years of education: Not on file   Highest education level: Not on file  Occupational History   Occupation: retired    Fish farm manager: RETIRED  Tobacco Use   Smoking status: Never   Smokeless tobacco: Never  Substance and Sexual Activity   Alcohol use: No    Alcohol/week: 0.0 standard drinks of  alcohol   Drug use: No   Sexual activity: Not on file  Other Topics Concern   Not on file  Social History Narrative   1 child died at 5 months old --hx of downs syndrome  Lives with son in a 2 story home.  Uses the 1st floor.  Has two living children.  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     Social Determinants of Health   Financial Resource Strain: High Risk (02/06/2021)   Overall Financial Resource Strain (CARDIA)    Difficulty of Paying Living Expenses: Hard  Food Insecurity: No Food Insecurity (01/25/2022)   Hunger Vital Sign    Worried About Running Out of Food in the Last Year: Never true    Ran Out of Food in the Last Year: Never true  Transportation Needs: No Transportation Needs (01/25/2022)   PRAPARE - Hydrologist (Medical): No    Lack of Transportation (Non-Medical): No  Physical Activity: Not on file  Stress: Not on file  Social Connections: Not on file     Family History: The patient's ***family history includes Allergies in her brother; Breast cancer in her sister; Fibromyalgia in her sister; Hyperlipidemia in her father and sister; Hypertension in her father, mother, and sister; Lymphoma in her father; Stroke in her father.  ROS:   Please see the history of present illness.    *** All other systems reviewed and are negative.  Labs/Other Studies Reviewed:    The following studies were reviewed today:  Carotid Duplex 04/01/22 Summary:  Right Carotid: The extracranial vessels were near-normal with only minimal  wall                thickening or plaque. Patent CCA and ICA, s/p CEA.   Left Carotid: Velocities in the left ICA are consistent with a 1-39%  stenosis.   Vertebrals: Right vertebral artery demonstrates high resistant flow. Left               vertebral artery demonstrates no discernable flow. Left  vertebral              artery demonstrates an occlusion.  Subclavians: Normal flow hemodynamics were seen in the right  subclavian  artery.              Left subclavian artery has decreased flow with monophasic               waveforms. SEE CT ANGIO NECK FROM 09/07/2019.   *See table(s) above for measurements and observations.   If clinically indicated, follow-up PRN.  Echo 01/10/22 1. Left ventricular ejection fraction, by estimation, is 65 to 70%. The  left ventricle has normal function. The left ventricle has no regional  wall motion abnormalities. There is mild concentric left ventricular  hypertrophy. Left ventricular diastolic  parameters are consistent with Grade I diastolic dysfunction (impaired  relaxation).   2. Right ventricular systolic function  is hyperdynamic. The right  ventricular size is normal. Tricuspid regurgitation signal is inadequate  for assessing PA pressure.   3. A small pericardial effusion is present. The pericardial effusion is  anterior to the right ventricle. There is no evidence of cardiac  tamponade.   4. The mitral valve was not well visualized. No evidence of mitral valve  regurgitation. No evidence of mitral stenosis.   5. The aortic valve was not well visualized. Aortic valve regurgitation  is not visualized. Aortic valve sclerosis is present, with no evidence of  aortic valve stenosis.   6. The inferior vena cava is normal in size with greater than 50%  respiratory variability, suggesting right atrial pressure of 3 mmHg.   Comparison(s): No significant change from prior study.  Coronary CTA 08/24/19 IMPRESSION: 1. Coronary calcium score of 663. This was 99 percentile for age and sex matched control.   2. Normal coronary origin with right dominance.   3. CAD-RADS 3. Moderate stenosis. Study interpretation is affected by motion and poor vasodilation.   There is moderate plaque in the distal left main and proximal LAD. Aggressive medical management is recommended. Additional analysis with CT FFR will be submitted.   1. CT FFR analysis didn't show any  significant stenoses in left main or proximal/mid LAD. CT FFR is not available for RCA secondary to motion. Aggressive medical management with high dose statin, aspirin and beta-blockers is recommended.  Recent Labs: 11/18/2021: Magnesium 1.4 01/24/2022: ALT 13 01/25/2022: BUN 14; Creatinine, Ser 0.57; Hemoglobin 10.0; Platelets 247; Potassium 3.7; Sodium 139; TSH 0.040  Recent Lipid Panel    Component Value Date/Time   CHOL 149 01/10/2022 0428   TRIG 178 (H) 01/10/2022 0428   HDL 27 (L) 01/10/2022 0428   CHOLHDL 5.5 01/10/2022 0428   VLDL 36 01/10/2022 0428   LDLCALC 86 01/10/2022 0428   LDLDIRECT 146.0 09/06/2018 1548     Risk Assessment/Calculations:   {Does this patient have ATRIAL FIBRILLATION?[phone]}       Physical Exam:    VS:  There were no vitals taken for this visit.    Wt Readings from Last 3 Encounters:  04/16/22 131 lb (59.4 kg)  01/09/22 131 lb 6.3 oz (59.6 kg)  11/16/21 138 lb 14.2 oz (63 kg)     GEN: *** Well nourished, well developed in no acute distress HEENT: Normal NECK: No JVD; No carotid bruits CARDIAC: ***RRR, no murmurs, rubs, gallops RESPIRATORY:  Clear to auscultation without rales, wheezing or rhonchi  ABDOMEN: Soft, non-tender, non-distended MUSCULOSKELETAL:  No edema; No deformity. *** pedal pulses, ***bilaterally SKIN: Warm and dry NEUROLOGIC:  Alert and oriented x 3 PSYCHIATRIC:  Normal affect   EKG:  EKG is *** ordered today.  The ekg ordered today demonstrates ***       Diagnoses:    No diagnosis found. Assessment and Plan:     Carotid artery disease: History of right CEA 2021. Only mild stenosis on duplex 04/01/22.   Subclavian/radial stenosis: Seen by [facility] 04/16/22. Left radial arterial occlusion with BP lower on left than right. Advised to continue Eliquis and return in 1 year for repeat duplex.  PAF:  Hypertension:   {Are you ordering a CV Procedure (e.g. stress test, cath, DCCV, TEE, etc)?   Press F2         :[id number]   Disposition:  Medication Adjustments/Labs and Tests Ordered: Current medicines are reviewed at length with the patient today.  Concerns regarding medicines are outlined above.  No orders  of the defined types were placed in this encounter.  No orders of the defined types were placed in this encounter.   There are no Patient Instructions on file for this visit.   Signed, Emmaline Life, NP  04/17/2022 10:02 AM    [place]

## 2022-04-18 DIAGNOSIS — M6281 Muscle weakness (generalized): Secondary | ICD-10-CM | POA: Diagnosis not present

## 2022-04-18 DIAGNOSIS — M797 Fibromyalgia: Secondary | ICD-10-CM | POA: Diagnosis not present

## 2022-04-21 DIAGNOSIS — E119 Type 2 diabetes mellitus without complications: Secondary | ICD-10-CM | POA: Diagnosis not present

## 2022-04-21 DIAGNOSIS — M6281 Muscle weakness (generalized): Secondary | ICD-10-CM | POA: Diagnosis not present

## 2022-04-21 DIAGNOSIS — M797 Fibromyalgia: Secondary | ICD-10-CM | POA: Diagnosis not present

## 2022-04-21 DIAGNOSIS — E039 Hypothyroidism, unspecified: Secondary | ICD-10-CM | POA: Diagnosis not present

## 2022-04-21 DIAGNOSIS — E785 Hyperlipidemia, unspecified: Secondary | ICD-10-CM | POA: Diagnosis not present

## 2022-04-21 DIAGNOSIS — E118 Type 2 diabetes mellitus with unspecified complications: Secondary | ICD-10-CM | POA: Diagnosis not present

## 2022-04-21 DIAGNOSIS — I10 Essential (primary) hypertension: Secondary | ICD-10-CM | POA: Diagnosis not present

## 2022-04-22 ENCOUNTER — Telehealth: Payer: Self-pay

## 2022-04-22 ENCOUNTER — Ambulatory Visit: Payer: Medicare Other | Admitting: Nurse Practitioner

## 2022-04-22 DIAGNOSIS — E785 Hyperlipidemia, unspecified: Secondary | ICD-10-CM | POA: Diagnosis not present

## 2022-04-22 DIAGNOSIS — M6281 Muscle weakness (generalized): Secondary | ICD-10-CM | POA: Diagnosis not present

## 2022-04-22 DIAGNOSIS — E039 Hypothyroidism, unspecified: Secondary | ICD-10-CM | POA: Diagnosis not present

## 2022-04-22 DIAGNOSIS — M797 Fibromyalgia: Secondary | ICD-10-CM | POA: Diagnosis not present

## 2022-04-22 DIAGNOSIS — I4891 Unspecified atrial fibrillation: Secondary | ICD-10-CM | POA: Diagnosis not present

## 2022-04-22 DIAGNOSIS — E118 Type 2 diabetes mellitus with unspecified complications: Secondary | ICD-10-CM | POA: Diagnosis not present

## 2022-04-22 NOTE — Progress Notes (Signed)
Care Management & Coordination Services Pharmacy Team  Reason for Encounter: General adherence update   Contacted patient for general health update and medication adherence call.  Unsuccessful outreach. Unable to leave voicemail.   Counseled patient on: {GENERALCOUNSELING:[id number]}   Chart Updates:  Recent office visits:  ***  Recent consult visits:  ***  Hospital visits:  {Hospital DC Yes/No:[id number]}  Medications: Outpatient Encounter Medications as of 04/22/2022  Medication Sig   B-D ULTRAFINE III SHORT PEN 31G X 8 MM MISC USE TO INJECT INSULIN ONCE DAILY. DX: E11.65   bisacodyl (DULCOLAX) 10 MG suppository Place 10 mg rectally daily as needed for moderate constipation.   Cholecalciferol (VITAMIN D3) 1.25 MG (50000 UT) CAPS Take 1 capsule by mouth once a week. (Patient taking differently: Take 50,000 Units by mouth once a week. Monday)   docusate sodium (COLACE) 100 MG capsule Take 100 mg by mouth 2 (two) times daily.   DULoxetine (CYMBALTA) 60 MG capsule Take 60 mg by mouth daily.   ELIQUIS 2.5 MG TABS tablet Take 2.5 mg by mouth 2 (two) times daily.   HUMALOG KWIKPEN 100 UNIT/ML KwikPen Inject 2-12 Units into the skin in the morning, at noon, in the evening, and at bedtime. Per sliding scale: 140-199= 2 units, 200-250= 6 units, 251-299= 8 units, 300-349= 10 units, 350-400= 12 units,   JANUVIA 100 MG tablet TAKE 1 TABLET BY MOUTH EVERY DAY (Patient taking differently: Take 100 mg by mouth daily.)   levothyroxine (SYNTHROID) 150 MCG tablet Take 150 mcg by mouth daily.   magnesium hydroxide (MILK OF MAGNESIA) 400 MG/5ML suspension Take 30 mLs by mouth daily as needed for mild constipation.   metFORMIN (GLUCOPHAGE) 1000 MG tablet Take 1,000 mg by mouth 2 (two) times daily.   Nutritional Supplements (NUTRITIONAL SHAKE PLUS) LIQD Take 1 Container by mouth daily.   Omega-3 Fatty Acids (FISH OIL) 1000 MG CAPS Take 1,000 mg by mouth daily.   Sodium Phosphates (RA SALINE ENEMA RE) Place  1 Dose rectally daily as needed (constipation).   No facility-administered encounter medications on file as of 04/22/2022.    Recent vitals BP Readings from Last 3 Encounters:  04/16/22 106/72  01/26/22 (!) 146/58  01/13/22 (!) 130/55   Pulse Readings from Last 3 Encounters:  04/16/22 94  01/26/22 75  01/13/22 87   Wt Readings from Last 3 Encounters:  04/16/22 131 lb (59.4 kg)  01/09/22 131 lb 6.3 oz (59.6 kg)  11/16/21 138 lb 14.2 oz (63 kg)   BMI Readings from Last 3 Encounters:  04/16/22 21.14 kg/m  01/09/22 21.21 kg/m  11/16/21 22.42 kg/m    Recent lab results    Component Value Date/Time   NA 139 01/25/2022 0458   K 3.7 01/25/2022 0458   CL 108 01/25/2022 0458   CO2 25 01/25/2022 0458   GLUCOSE 144 (H) 01/25/2022 0458   BUN 14 01/25/2022 0458   CREATININE 0.57 01/25/2022 0458   CREATININE 0.81 02/10/2019 1240   CALCIUM 8.4 (L) 01/25/2022 0458    Lab Results  Component Value Date   CREATININE 0.57 01/25/2022   GFR 55.46 (L) 12/25/2020   GFRNONAA >60 01/25/2022   GFRAA >60 09/05/2019   Lab Results  Component Value Date/Time   HGBA1C 7.5 (H) 11/16/2021 01:35 AM   HGBA1C 14.4 (H) 12/25/2020 01:27 PM   MICROALBUR 3.2 (H) 05/08/2017 01:49 PM   MICROALBUR 1.0 06/20/2008 11:12 AM    Lab Results  Component Value Date   CHOL 149 01/10/2022  HDL 27 (L) 01/10/2022   LDLCALC 86 01/10/2022   LDLDIRECT 146.0 09/06/2018   TRIG 178 (H) 01/10/2022   CHOLHDL 5.5 01/10/2022    Care Gaps: Annual wellness visit in last year? No  If Diabetic: Last eye exam / retinopathy screening:2016 Last diabetic foot exam:2019 Last UACR: none  Star Rating Drugs:  Medication:  Last Fill: Day Supply Humalog  04/12/22 28 Januvia 100mg  03/17/22 30 Metformin 1000mg  04/10/22 30 Lantus   04/21/22  25  Charlene Brooke, PharmD notified  Avel Sensor, [place] Assistant [phone]

## 2022-04-23 DIAGNOSIS — M6281 Muscle weakness (generalized): Secondary | ICD-10-CM | POA: Diagnosis not present

## 2022-04-23 DIAGNOSIS — M797 Fibromyalgia: Secondary | ICD-10-CM | POA: Diagnosis not present

## 2022-04-24 DIAGNOSIS — I10 Essential (primary) hypertension: Secondary | ICD-10-CM | POA: Diagnosis not present

## 2022-04-24 DIAGNOSIS — M6281 Muscle weakness (generalized): Secondary | ICD-10-CM | POA: Diagnosis not present

## 2022-04-24 DIAGNOSIS — M797 Fibromyalgia: Secondary | ICD-10-CM | POA: Diagnosis not present

## 2022-04-24 DIAGNOSIS — E559 Vitamin D deficiency, unspecified: Secondary | ICD-10-CM | POA: Diagnosis not present

## 2022-04-24 DIAGNOSIS — E119 Type 2 diabetes mellitus without complications: Secondary | ICD-10-CM | POA: Diagnosis not present

## 2022-04-25 DIAGNOSIS — E039 Hypothyroidism, unspecified: Secondary | ICD-10-CM | POA: Diagnosis not present

## 2022-04-25 DIAGNOSIS — E118 Type 2 diabetes mellitus with unspecified complications: Secondary | ICD-10-CM | POA: Diagnosis not present

## 2022-04-25 DIAGNOSIS — M797 Fibromyalgia: Secondary | ICD-10-CM | POA: Diagnosis not present

## 2022-04-25 DIAGNOSIS — E559 Vitamin D deficiency, unspecified: Secondary | ICD-10-CM | POA: Diagnosis not present

## 2022-04-25 DIAGNOSIS — M6281 Muscle weakness (generalized): Secondary | ICD-10-CM | POA: Diagnosis not present

## 2022-04-28 DIAGNOSIS — E785 Hyperlipidemia, unspecified: Secondary | ICD-10-CM | POA: Diagnosis not present

## 2022-04-28 DIAGNOSIS — E118 Type 2 diabetes mellitus with unspecified complications: Secondary | ICD-10-CM | POA: Diagnosis not present

## 2022-04-28 DIAGNOSIS — M6281 Muscle weakness (generalized): Secondary | ICD-10-CM | POA: Diagnosis not present

## 2022-04-28 DIAGNOSIS — M797 Fibromyalgia: Secondary | ICD-10-CM | POA: Diagnosis not present

## 2022-04-29 DIAGNOSIS — M797 Fibromyalgia: Secondary | ICD-10-CM | POA: Diagnosis not present

## 2022-04-29 DIAGNOSIS — M6281 Muscle weakness (generalized): Secondary | ICD-10-CM | POA: Diagnosis not present

## 2022-04-30 DIAGNOSIS — M6281 Muscle weakness (generalized): Secondary | ICD-10-CM | POA: Diagnosis not present

## 2022-04-30 DIAGNOSIS — M797 Fibromyalgia: Secondary | ICD-10-CM | POA: Diagnosis not present

## 2022-05-01 DIAGNOSIS — I10 Essential (primary) hypertension: Secondary | ICD-10-CM | POA: Diagnosis not present

## 2022-05-01 DIAGNOSIS — M797 Fibromyalgia: Secondary | ICD-10-CM | POA: Diagnosis not present

## 2022-05-01 DIAGNOSIS — M6281 Muscle weakness (generalized): Secondary | ICD-10-CM | POA: Diagnosis not present

## 2022-05-02 DIAGNOSIS — M6281 Muscle weakness (generalized): Secondary | ICD-10-CM | POA: Diagnosis not present

## 2022-05-02 DIAGNOSIS — M797 Fibromyalgia: Secondary | ICD-10-CM | POA: Diagnosis not present

## 2022-05-02 DIAGNOSIS — F39 Unspecified mood [affective] disorder: Secondary | ICD-10-CM | POA: Diagnosis not present

## 2022-05-05 DIAGNOSIS — E118 Type 2 diabetes mellitus with unspecified complications: Secondary | ICD-10-CM | POA: Diagnosis not present

## 2022-05-05 DIAGNOSIS — M6281 Muscle weakness (generalized): Secondary | ICD-10-CM | POA: Diagnosis not present

## 2022-05-05 DIAGNOSIS — M797 Fibromyalgia: Secondary | ICD-10-CM | POA: Diagnosis not present

## 2022-05-05 DIAGNOSIS — R4182 Altered mental status, unspecified: Secondary | ICD-10-CM | POA: Diagnosis not present

## 2022-05-06 DIAGNOSIS — M6281 Muscle weakness (generalized): Secondary | ICD-10-CM | POA: Diagnosis not present

## 2022-05-06 DIAGNOSIS — M797 Fibromyalgia: Secondary | ICD-10-CM | POA: Diagnosis not present

## 2022-05-07 DIAGNOSIS — M797 Fibromyalgia: Secondary | ICD-10-CM | POA: Diagnosis not present

## 2022-05-07 DIAGNOSIS — M6281 Muscle weakness (generalized): Secondary | ICD-10-CM | POA: Diagnosis not present

## 2022-05-08 DIAGNOSIS — M797 Fibromyalgia: Secondary | ICD-10-CM | POA: Diagnosis not present

## 2022-05-08 DIAGNOSIS — M6281 Muscle weakness (generalized): Secondary | ICD-10-CM | POA: Diagnosis not present

## 2022-05-09 DIAGNOSIS — M6281 Muscle weakness (generalized): Secondary | ICD-10-CM | POA: Diagnosis not present

## 2022-05-09 DIAGNOSIS — M797 Fibromyalgia: Secondary | ICD-10-CM | POA: Diagnosis not present

## 2022-05-12 DIAGNOSIS — M797 Fibromyalgia: Secondary | ICD-10-CM | POA: Diagnosis not present

## 2022-05-12 DIAGNOSIS — M6281 Muscle weakness (generalized): Secondary | ICD-10-CM | POA: Diagnosis not present

## 2022-05-13 DIAGNOSIS — M6281 Muscle weakness (generalized): Secondary | ICD-10-CM | POA: Diagnosis not present

## 2022-05-13 DIAGNOSIS — M797 Fibromyalgia: Secondary | ICD-10-CM | POA: Diagnosis not present

## 2022-05-14 DIAGNOSIS — M797 Fibromyalgia: Secondary | ICD-10-CM | POA: Diagnosis not present

## 2022-05-14 DIAGNOSIS — M6281 Muscle weakness (generalized): Secondary | ICD-10-CM | POA: Diagnosis not present

## 2022-05-15 DIAGNOSIS — M797 Fibromyalgia: Secondary | ICD-10-CM | POA: Diagnosis not present

## 2022-05-15 DIAGNOSIS — M6281 Muscle weakness (generalized): Secondary | ICD-10-CM | POA: Diagnosis not present

## 2022-05-22 DIAGNOSIS — I4891 Unspecified atrial fibrillation: Secondary | ICD-10-CM | POA: Diagnosis not present

## 2022-05-22 DIAGNOSIS — M6281 Muscle weakness (generalized): Secondary | ICD-10-CM | POA: Diagnosis not present

## 2022-05-22 DIAGNOSIS — E039 Hypothyroidism, unspecified: Secondary | ICD-10-CM | POA: Diagnosis not present

## 2022-05-22 DIAGNOSIS — E118 Type 2 diabetes mellitus with unspecified complications: Secondary | ICD-10-CM | POA: Diagnosis not present

## 2022-05-23 DIAGNOSIS — N390 Urinary tract infection, site not specified: Secondary | ICD-10-CM | POA: Diagnosis not present

## 2022-05-26 DIAGNOSIS — M6281 Muscle weakness (generalized): Secondary | ICD-10-CM | POA: Diagnosis not present

## 2022-05-26 DIAGNOSIS — E118 Type 2 diabetes mellitus with unspecified complications: Secondary | ICD-10-CM | POA: Diagnosis not present

## 2022-05-26 DIAGNOSIS — B9620 Unspecified Escherichia coli [E. coli] as the cause of diseases classified elsewhere: Secondary | ICD-10-CM | POA: Diagnosis not present

## 2022-05-30 DIAGNOSIS — A1881 Tuberculosis of thyroid gland: Secondary | ICD-10-CM | POA: Diagnosis not present

## 2022-06-02 DIAGNOSIS — E039 Hypothyroidism, unspecified: Secondary | ICD-10-CM | POA: Diagnosis not present

## 2022-06-02 DIAGNOSIS — M6281 Muscle weakness (generalized): Secondary | ICD-10-CM | POA: Diagnosis not present

## 2022-06-06 DIAGNOSIS — F329 Major depressive disorder, single episode, unspecified: Secondary | ICD-10-CM | POA: Diagnosis not present

## 2022-06-13 NOTE — Progress Notes (Deleted)
Cardiology Office Note:    Date:  06/13/2022   ID:  Jaclyn [name], DOB 12/30/1935, MRN [id number]  PCP:  Excell Seltzer, MD   [facility] Providers Cardiologist:  Charlton Haws, MD { Click to update primary MD,subspecialty MD or APP then REFRESH:1}    Referring MD: Excell Seltzer, MD   Chief Complaint: ***  History of Present Illness:    Jaclyn [name] is a *** 87 y.o. female with a hx of hypertension, hyperlipidemia, type 2 diabetes, PAF, TIA, carotid artery disease, and dementia.  She initially refused CEA a but eventually had right CEA with Dr. Arbie Cookey 08/2019.  She had left 40 to 59% carotid disease.  TTE 08/2019 with normal EF, no significant valve disease.  Admitted to [facility] 02/15/2021 with FTT at SNF with urinary incontinence, confusion, worsening dementia, and poor p.o. intake.  Noted to have UTI.  TSH was 22 and blood sugar was 336 at the time.  MRI of the head showed no acute process.   Referred to cardiology for atrial fibrillation.  Seen by Dr. Eden Emms but there was a gap of about 5 years between visits. Last seen in cardiology clinic 04/01/21 by Dr. Eden Emms.  At that time her son was with her and did not have good history. It was not clear how closely her medical conditions were followed.  She was maintaining sinus rhythm and OAC had been discontinued.  Admission September, October, November and December 2023 with failure to thrive, poor po intake, acute metabolic encephalopathy 2/2 UTI.   She had carotid ultrasound 04/01/22 which revealed left ICA with 1 to 39% stenosis, occlusion of left vertebral artery, near-normal right extracranial vessels with patent CCA and ICA s/p CEA.  She was seen in consult by Dr. Randie Heinz, [facility], on 04/16/2022.  Blood pressure on the left reads lower than the right but she is otherwise asymptomatic.  She was advised to remain on Eliquis given recent CVA and follow-up in 1 year.  Today, she is here   Past Medical History:  Diagnosis Date   Atrial  fibrillation ([facility])    Benign neoplasm of colon    Carotid artery occlusion    60-79% right ICA stenosis   Difficult intubation 02/17/2006   During surgery to remove large polyp   Diverticulosis of colon (without mention of hemorrhage)    Dysthymic disorder    Fibromyalgia    Headache(784.0)    Insomnia, unspecified    Interstitial cystitis    Osteoarthrosis, unspecified whether generalized or localized, unspecified site    Other and unspecified hyperlipidemia    Other chest pain    Other specified benign mammary dysplasias    TIA (transient ischemic attack)    Type II or unspecified type diabetes mellitus without mention of complication, not stated as uncontrolled    Unspecified essential hypertension    Unspecified hypothyroidism    Unspecified vitamin D deficiency    Urinary tract infection, site not specified     Past Surgical History:  Procedure Laterality Date   ABDOMINAL HYSTERECTOMY  1988   cervical dysplasia   CARDIAC CATHETERIZATION  02/19/06   EF 60%   COLONOSCOPY     ENDARTERECTOMY Right 08/26/2019   Procedure: RIGHT CAROTID ENDARTERECTOMY;  Surgeon: Larina Earthly, MD;  Location: [facility] OR;  Service: Vascular;  Laterality: Right;   PATCH ANGIOPLASTY Right 08/26/2019   Procedure: PATCH ANGIOPLASTY OF RIGHT COMMON CAROTID ARTERY USING HEMASHIELD PLATINUM [place];  Surgeon: Larina Earthly, MD;  Location: [facility]  OR;  Service: Vascular;  Laterality: Right;   RIGHT COLECTOMY  2005   for villous adenoma of the cecum Dr.streck   VESICOVAGINAL FISTULA CLOSURE W/ TAH  1990   w/ cystocele &retocele repairs Dr. Elana Alm    Current Medications: No outpatient medications have been marked as taking for the 06/19/22 encounter (Appointment) with Lissa Hoard Zachary George, NP.     Allergies:   Naproxen sodium, Statins, Sulfonamide derivatives, Latex, and Shellfish allergy   Social History   Socioeconomic History   Marital status: Single    Spouse name: Not on file   Number of children: 3    Years of education: Not on file   Highest education level: Not on file  Occupational History   Occupation: retired    Associate Professor: RETIRED  Tobacco Use   Smoking status: Never   Smokeless tobacco: Never  Substance and Sexual Activity   Alcohol use: No    Alcohol/week: 0.0 standard drinks of alcohol   Drug use: No   Sexual activity: Not on file  Other Topics Concern   Not on file  Social History Narrative   1 child died at 85 months old --hx of downs syndrome  Lives with son in a 2 story home.  Uses the 1st floor.  Has two living children.  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     Social Determinants of Health   Financial Resource Strain: High Risk (02/06/2021)   Overall Financial Resource Strain (CARDIA)    Difficulty of Paying Living Expenses: Hard  Food Insecurity: No Food Insecurity (01/25/2022)   Hunger Vital Sign    Worried About Running Out of Food in the Last Year: Never true    Ran Out of Food in the Last Year: Never true  Transportation Needs: No Transportation Needs (01/25/2022)   PRAPARE - Administrator, Civil Service (Medical): No    Lack of Transportation (Non-Medical): No  Physical Activity: Not on file  Stress: Not on file  Social Connections: Not on file     Family History: The patient's ***family history includes Allergies in her brother; Breast cancer in her sister; Fibromyalgia in her sister; Hyperlipidemia in her father and sister; Hypertension in her father, mother, and sister; Lymphoma in her father; Stroke in her father.  ROS:   Please see the history of present illness.    *** All other systems reviewed and are negative.  Labs/Other Studies Reviewed:    The following studies were reviewed today:  Carotid Duplex 04/01/22 Right Carotid: The extracranial vessels were near-normal with only minimal  wall                thickening or plaque. Patent CCA and ICA, s/p CEA.   Left Carotid: Velocities in the left ICA are consistent with a  1-39%  stenosis.   Vertebrals: Right vertebral artery demonstrates high resistant flow. Left               vertebral artery demonstrates no discernable flow. Left  vertebral              artery demonstrates an occlusion.  Subclavians: Normal flow hemodynamics were seen in the right subclavian  artery.              Left subclavian artery has decreased flow with monophasic               waveforms. SEE CT ANGIO NECK FROM 09/07/2019.   *See table(s) above for measurements and observations.  Echo 01/10/22 1. Left ventricular ejection fraction, by estimation, is 65 to 70%. The  left ventricle has normal function. The left ventricle has no regional  wall motion abnormalities. There is mild concentric left ventricular  hypertrophy. Left ventricular diastolic  parameters are consistent with Grade I diastolic dysfunction (impaired  relaxation).   2. Right ventricular systolic function is hyperdynamic. The right  ventricular size is normal. Tricuspid regurgitation signal is inadequate  for assessing PA pressure.   3. A small pericardial effusion is present. The pericardial effusion is  anterior to the right ventricle. There is no evidence of cardiac  tamponade.   4. The mitral valve was not well visualized. No evidence of mitral valve  regurgitation. No evidence of mitral stenosis.   5. The aortic valve was not well visualized. Aortic valve regurgitation  is not visualized. Aortic valve sclerosis is present, with no evidence of  aortic valve stenosis.   6. The inferior vena cava is normal in size with greater than 50%  respiratory variability, suggesting right atrial pressure of 3 mmHg.   Comparison(s): No significant change from prior study.  Coronary CTA 08/24/2019 IMPRESSION: 1. Coronary calcium score of 663. This was 57 percentile for age and sex matched control.   2. Normal coronary origin with right dominance.   3. CAD-RADS 3. Moderate stenosis. Study interpretation is  affected by motion and poor vasodilation.   There is moderate plaque in the distal left main and proximal LAD. Aggressive medical management is recommended. Additional analysis with CT FFR will be submitted.   1. CT FFR analysis didn't show any significant stenoses in left main or proximal/mid LAD. CT FFR is not available for RCA secondary to motion. Aggressive medical management with high dose statin, aspirin and beta-blockers is recommended.  Recent Labs: 11/18/2021: Magnesium 1.4 01/24/2022: ALT 13 01/25/2022: BUN 14; Creatinine, Ser 0.57; Hemoglobin 10.0; Platelets 247; Potassium 3.7; Sodium 139; TSH 0.040  Recent Lipid Panel    Component Value Date/Time   CHOL 149 01/10/2022 0428   TRIG 178 (H) 01/10/2022 0428   HDL 27 (L) 01/10/2022 0428   CHOLHDL 5.5 01/10/2022 0428   VLDL 36 01/10/2022 0428   LDLCALC 86 01/10/2022 0428   LDLDIRECT 146.0 09/06/2018 1548     Risk Assessment/Calculations:   {Does this patient have ATRIAL FIBRILLATION?:[phone]}       Physical Exam:    VS:  There were no vitals taken for this visit.    Wt Readings from Last 3 Encounters:  04/16/22 131 lb (59.4 kg)  01/09/22 131 lb 6.3 oz (59.6 kg)  11/16/21 138 lb 14.2 oz (63 kg)     GEN: *** Well nourished, well developed in no acute distress HEENT: Normal NECK: No JVD; No carotid bruits CARDIAC: ***RRR, no murmurs, rubs, gallops RESPIRATORY:  Clear to auscultation without rales, wheezing or rhonchi  ABDOMEN: Soft, non-tender, non-distended MUSCULOSKELETAL:  No edema; No deformity. *** pedal pulses, ***bilaterally SKIN: Warm and dry NEUROLOGIC:  Alert and oriented x 3 PSYCHIATRIC:  Normal affect   EKG:  EKG is *** ordered today.  The ekg ordered today demonstrates ***  No BP recorded.  {Refresh Note OR Click here to enter BP  :1}***    Diagnoses:    No diagnosis found. Assessment and Plan:     PAF on chronic anticoagulation: Electrolyte disturbance: Hypothyroid:  {Are you  ordering a CV Procedure (e.g. stress test, cath, DCCV, TEE, etc)?   Press F2        :[id number]}  Disposition:  Medication Adjustments/Labs and Tests Ordered: Current medicines are reviewed at length with the patient today.  Concerns regarding medicines are outlined above.  No orders of the defined types were placed in this encounter.  No orders of the defined types were placed in this encounter.   There are no Patient Instructions on file for this visit.   Signed, Levi Aland, NP  06/13/2022 4:33 PM    [facility]

## 2022-06-18 DIAGNOSIS — F329 Major depressive disorder, single episode, unspecified: Secondary | ICD-10-CM | POA: Diagnosis not present

## 2022-06-18 DIAGNOSIS — E118 Type 2 diabetes mellitus with unspecified complications: Secondary | ICD-10-CM | POA: Diagnosis not present

## 2022-06-18 DIAGNOSIS — I4891 Unspecified atrial fibrillation: Secondary | ICD-10-CM | POA: Diagnosis not present

## 2022-06-18 DIAGNOSIS — E039 Hypothyroidism, unspecified: Secondary | ICD-10-CM | POA: Diagnosis not present

## 2022-06-19 ENCOUNTER — Ambulatory Visit: Payer: Medicare Other | Admitting: Nurse Practitioner

## 2022-06-24 DIAGNOSIS — E119 Type 2 diabetes mellitus without complications: Secondary | ICD-10-CM | POA: Diagnosis not present

## 2022-06-27 DIAGNOSIS — I10 Essential (primary) hypertension: Secondary | ICD-10-CM | POA: Diagnosis not present

## 2022-06-27 DIAGNOSIS — E118 Type 2 diabetes mellitus with unspecified complications: Secondary | ICD-10-CM | POA: Diagnosis not present

## 2022-07-03 DIAGNOSIS — E1010 Type 1 diabetes mellitus with ketoacidosis without coma: Secondary | ICD-10-CM | POA: Diagnosis not present

## 2022-07-03 DIAGNOSIS — E559 Vitamin D deficiency, unspecified: Secondary | ICD-10-CM | POA: Diagnosis not present

## 2022-07-03 DIAGNOSIS — E119 Type 2 diabetes mellitus without complications: Secondary | ICD-10-CM | POA: Diagnosis not present

## 2022-07-03 DIAGNOSIS — I10 Essential (primary) hypertension: Secondary | ICD-10-CM | POA: Diagnosis not present

## 2022-07-18 DIAGNOSIS — M79604 Pain in right leg: Secondary | ICD-10-CM | POA: Diagnosis not present

## 2022-07-18 DIAGNOSIS — M79632 Pain in left forearm: Secondary | ICD-10-CM | POA: Diagnosis not present

## 2022-07-29 DIAGNOSIS — E1159 Type 2 diabetes mellitus with other circulatory complications: Secondary | ICD-10-CM | POA: Diagnosis not present

## 2022-07-29 DIAGNOSIS — E559 Vitamin D deficiency, unspecified: Secondary | ICD-10-CM | POA: Diagnosis not present

## 2022-07-29 DIAGNOSIS — I10 Essential (primary) hypertension: Secondary | ICD-10-CM | POA: Diagnosis not present

## 2022-08-01 DIAGNOSIS — M6281 Muscle weakness (generalized): Secondary | ICD-10-CM | POA: Diagnosis not present

## 2022-08-01 DIAGNOSIS — E1165 Type 2 diabetes mellitus with hyperglycemia: Secondary | ICD-10-CM | POA: Diagnosis not present

## 2022-08-01 DIAGNOSIS — R1312 Dysphagia, oropharyngeal phase: Secondary | ICD-10-CM | POA: Diagnosis not present

## 2022-08-01 DIAGNOSIS — E039 Hypothyroidism, unspecified: Secondary | ICD-10-CM | POA: Diagnosis not present

## 2022-08-01 DIAGNOSIS — I69352 Hemiplegia and hemiparesis following cerebral infarction affecting left dominant side: Secondary | ICD-10-CM | POA: Diagnosis not present

## 2022-08-01 DIAGNOSIS — I69391 Dysphagia following cerebral infarction: Secondary | ICD-10-CM | POA: Diagnosis not present

## 2022-08-01 DIAGNOSIS — E785 Hyperlipidemia, unspecified: Secondary | ICD-10-CM | POA: Diagnosis not present

## 2022-08-04 DIAGNOSIS — I69391 Dysphagia following cerebral infarction: Secondary | ICD-10-CM | POA: Diagnosis not present

## 2022-08-04 DIAGNOSIS — E785 Hyperlipidemia, unspecified: Secondary | ICD-10-CM | POA: Diagnosis not present

## 2022-08-04 DIAGNOSIS — M6281 Muscle weakness (generalized): Secondary | ICD-10-CM | POA: Diagnosis not present

## 2022-08-04 DIAGNOSIS — E1165 Type 2 diabetes mellitus with hyperglycemia: Secondary | ICD-10-CM | POA: Diagnosis not present

## 2022-08-04 DIAGNOSIS — E039 Hypothyroidism, unspecified: Secondary | ICD-10-CM | POA: Diagnosis not present

## 2022-08-04 DIAGNOSIS — I69352 Hemiplegia and hemiparesis following cerebral infarction affecting left dominant side: Secondary | ICD-10-CM | POA: Diagnosis not present

## 2022-08-04 DIAGNOSIS — R1312 Dysphagia, oropharyngeal phase: Secondary | ICD-10-CM | POA: Diagnosis not present

## 2022-08-05 DIAGNOSIS — I69352 Hemiplegia and hemiparesis following cerebral infarction affecting left dominant side: Secondary | ICD-10-CM | POA: Diagnosis not present

## 2022-08-05 DIAGNOSIS — I69391 Dysphagia following cerebral infarction: Secondary | ICD-10-CM | POA: Diagnosis not present

## 2022-08-05 DIAGNOSIS — E1165 Type 2 diabetes mellitus with hyperglycemia: Secondary | ICD-10-CM | POA: Diagnosis not present

## 2022-08-05 DIAGNOSIS — M6281 Muscle weakness (generalized): Secondary | ICD-10-CM | POA: Diagnosis not present

## 2022-08-05 DIAGNOSIS — E039 Hypothyroidism, unspecified: Secondary | ICD-10-CM | POA: Diagnosis not present

## 2022-08-05 DIAGNOSIS — E785 Hyperlipidemia, unspecified: Secondary | ICD-10-CM | POA: Diagnosis not present

## 2022-08-05 DIAGNOSIS — R1312 Dysphagia, oropharyngeal phase: Secondary | ICD-10-CM | POA: Diagnosis not present

## 2022-08-06 DIAGNOSIS — E785 Hyperlipidemia, unspecified: Secondary | ICD-10-CM | POA: Diagnosis not present

## 2022-08-06 DIAGNOSIS — R1312 Dysphagia, oropharyngeal phase: Secondary | ICD-10-CM | POA: Diagnosis not present

## 2022-08-06 DIAGNOSIS — M6281 Muscle weakness (generalized): Secondary | ICD-10-CM | POA: Diagnosis not present

## 2022-08-06 DIAGNOSIS — E039 Hypothyroidism, unspecified: Secondary | ICD-10-CM | POA: Diagnosis not present

## 2022-08-06 DIAGNOSIS — I69391 Dysphagia following cerebral infarction: Secondary | ICD-10-CM | POA: Diagnosis not present

## 2022-08-06 DIAGNOSIS — E1165 Type 2 diabetes mellitus with hyperglycemia: Secondary | ICD-10-CM | POA: Diagnosis not present

## 2022-08-06 DIAGNOSIS — I69352 Hemiplegia and hemiparesis following cerebral infarction affecting left dominant side: Secondary | ICD-10-CM | POA: Diagnosis not present

## 2022-08-08 DIAGNOSIS — I69391 Dysphagia following cerebral infarction: Secondary | ICD-10-CM | POA: Diagnosis not present

## 2022-08-08 DIAGNOSIS — I69352 Hemiplegia and hemiparesis following cerebral infarction affecting left dominant side: Secondary | ICD-10-CM | POA: Diagnosis not present

## 2022-08-08 DIAGNOSIS — M6281 Muscle weakness (generalized): Secondary | ICD-10-CM | POA: Diagnosis not present

## 2022-08-08 DIAGNOSIS — E1165 Type 2 diabetes mellitus with hyperglycemia: Secondary | ICD-10-CM | POA: Diagnosis not present

## 2022-08-08 DIAGNOSIS — R1312 Dysphagia, oropharyngeal phase: Secondary | ICD-10-CM | POA: Diagnosis not present

## 2022-08-08 DIAGNOSIS — E785 Hyperlipidemia, unspecified: Secondary | ICD-10-CM | POA: Diagnosis not present

## 2022-08-08 DIAGNOSIS — E039 Hypothyroidism, unspecified: Secondary | ICD-10-CM | POA: Diagnosis not present

## 2022-08-11 DIAGNOSIS — I69391 Dysphagia following cerebral infarction: Secondary | ICD-10-CM | POA: Diagnosis not present

## 2022-08-11 DIAGNOSIS — R1312 Dysphagia, oropharyngeal phase: Secondary | ICD-10-CM | POA: Diagnosis not present

## 2022-08-11 DIAGNOSIS — E1165 Type 2 diabetes mellitus with hyperglycemia: Secondary | ICD-10-CM | POA: Diagnosis not present

## 2022-08-11 DIAGNOSIS — E039 Hypothyroidism, unspecified: Secondary | ICD-10-CM | POA: Diagnosis not present

## 2022-08-11 DIAGNOSIS — E785 Hyperlipidemia, unspecified: Secondary | ICD-10-CM | POA: Diagnosis not present

## 2022-08-11 DIAGNOSIS — M6281 Muscle weakness (generalized): Secondary | ICD-10-CM | POA: Diagnosis not present

## 2022-08-11 DIAGNOSIS — I69352 Hemiplegia and hemiparesis following cerebral infarction affecting left dominant side: Secondary | ICD-10-CM | POA: Diagnosis not present

## 2022-08-12 DIAGNOSIS — R1312 Dysphagia, oropharyngeal phase: Secondary | ICD-10-CM | POA: Diagnosis not present

## 2022-08-12 DIAGNOSIS — E1165 Type 2 diabetes mellitus with hyperglycemia: Secondary | ICD-10-CM | POA: Diagnosis not present

## 2022-08-12 DIAGNOSIS — I69352 Hemiplegia and hemiparesis following cerebral infarction affecting left dominant side: Secondary | ICD-10-CM | POA: Diagnosis not present

## 2022-08-12 DIAGNOSIS — I69391 Dysphagia following cerebral infarction: Secondary | ICD-10-CM | POA: Diagnosis not present

## 2022-08-12 DIAGNOSIS — E039 Hypothyroidism, unspecified: Secondary | ICD-10-CM | POA: Diagnosis not present

## 2022-08-12 DIAGNOSIS — E785 Hyperlipidemia, unspecified: Secondary | ICD-10-CM | POA: Diagnosis not present

## 2022-08-12 DIAGNOSIS — M6281 Muscle weakness (generalized): Secondary | ICD-10-CM | POA: Diagnosis not present

## 2022-08-13 DIAGNOSIS — I69391 Dysphagia following cerebral infarction: Secondary | ICD-10-CM | POA: Diagnosis not present

## 2022-08-13 DIAGNOSIS — R1312 Dysphagia, oropharyngeal phase: Secondary | ICD-10-CM | POA: Diagnosis not present

## 2022-08-13 DIAGNOSIS — E785 Hyperlipidemia, unspecified: Secondary | ICD-10-CM | POA: Diagnosis not present

## 2022-08-13 DIAGNOSIS — M6281 Muscle weakness (generalized): Secondary | ICD-10-CM | POA: Diagnosis not present

## 2022-08-13 DIAGNOSIS — E1165 Type 2 diabetes mellitus with hyperglycemia: Secondary | ICD-10-CM | POA: Diagnosis not present

## 2022-08-13 DIAGNOSIS — E039 Hypothyroidism, unspecified: Secondary | ICD-10-CM | POA: Diagnosis not present

## 2022-08-13 DIAGNOSIS — I69352 Hemiplegia and hemiparesis following cerebral infarction affecting left dominant side: Secondary | ICD-10-CM | POA: Diagnosis not present

## 2022-08-14 DIAGNOSIS — E1165 Type 2 diabetes mellitus with hyperglycemia: Secondary | ICD-10-CM | POA: Diagnosis not present

## 2022-08-14 DIAGNOSIS — M6281 Muscle weakness (generalized): Secondary | ICD-10-CM | POA: Diagnosis not present

## 2022-08-14 DIAGNOSIS — R1312 Dysphagia, oropharyngeal phase: Secondary | ICD-10-CM | POA: Diagnosis not present

## 2022-08-14 DIAGNOSIS — I69352 Hemiplegia and hemiparesis following cerebral infarction affecting left dominant side: Secondary | ICD-10-CM | POA: Diagnosis not present

## 2022-08-14 DIAGNOSIS — I69391 Dysphagia following cerebral infarction: Secondary | ICD-10-CM | POA: Diagnosis not present

## 2022-08-14 DIAGNOSIS — E785 Hyperlipidemia, unspecified: Secondary | ICD-10-CM | POA: Diagnosis not present

## 2022-08-14 DIAGNOSIS — E039 Hypothyroidism, unspecified: Secondary | ICD-10-CM | POA: Diagnosis not present

## 2022-08-15 DIAGNOSIS — I69352 Hemiplegia and hemiparesis following cerebral infarction affecting left dominant side: Secondary | ICD-10-CM | POA: Diagnosis not present

## 2022-08-15 DIAGNOSIS — M6281 Muscle weakness (generalized): Secondary | ICD-10-CM | POA: Diagnosis not present

## 2022-08-15 DIAGNOSIS — R1312 Dysphagia, oropharyngeal phase: Secondary | ICD-10-CM | POA: Diagnosis not present

## 2022-08-15 DIAGNOSIS — E1165 Type 2 diabetes mellitus with hyperglycemia: Secondary | ICD-10-CM | POA: Diagnosis not present

## 2022-08-15 DIAGNOSIS — E785 Hyperlipidemia, unspecified: Secondary | ICD-10-CM | POA: Diagnosis not present

## 2022-08-15 DIAGNOSIS — E039 Hypothyroidism, unspecified: Secondary | ICD-10-CM | POA: Diagnosis not present

## 2022-08-15 DIAGNOSIS — I69391 Dysphagia following cerebral infarction: Secondary | ICD-10-CM | POA: Diagnosis not present

## 2022-08-18 DIAGNOSIS — I69352 Hemiplegia and hemiparesis following cerebral infarction affecting left dominant side: Secondary | ICD-10-CM | POA: Diagnosis not present

## 2022-08-18 DIAGNOSIS — E1165 Type 2 diabetes mellitus with hyperglycemia: Secondary | ICD-10-CM | POA: Diagnosis not present

## 2022-08-18 DIAGNOSIS — M6281 Muscle weakness (generalized): Secondary | ICD-10-CM | POA: Diagnosis not present

## 2022-08-18 DIAGNOSIS — R1312 Dysphagia, oropharyngeal phase: Secondary | ICD-10-CM | POA: Diagnosis not present

## 2022-08-18 DIAGNOSIS — I69391 Dysphagia following cerebral infarction: Secondary | ICD-10-CM | POA: Diagnosis not present

## 2022-08-19 DIAGNOSIS — M6281 Muscle weakness (generalized): Secondary | ICD-10-CM | POA: Diagnosis not present

## 2022-08-19 DIAGNOSIS — F329 Major depressive disorder, single episode, unspecified: Secondary | ICD-10-CM | POA: Diagnosis not present

## 2022-08-19 DIAGNOSIS — E1165 Type 2 diabetes mellitus with hyperglycemia: Secondary | ICD-10-CM | POA: Diagnosis not present

## 2022-08-19 DIAGNOSIS — I69352 Hemiplegia and hemiparesis following cerebral infarction affecting left dominant side: Secondary | ICD-10-CM | POA: Diagnosis not present

## 2022-08-19 DIAGNOSIS — E118 Type 2 diabetes mellitus with unspecified complications: Secondary | ICD-10-CM | POA: Diagnosis not present

## 2022-08-19 DIAGNOSIS — I4891 Unspecified atrial fibrillation: Secondary | ICD-10-CM | POA: Diagnosis not present

## 2022-08-19 DIAGNOSIS — E039 Hypothyroidism, unspecified: Secondary | ICD-10-CM | POA: Diagnosis not present

## 2022-08-19 DIAGNOSIS — I69391 Dysphagia following cerebral infarction: Secondary | ICD-10-CM | POA: Diagnosis not present

## 2022-08-19 DIAGNOSIS — R1312 Dysphagia, oropharyngeal phase: Secondary | ICD-10-CM | POA: Diagnosis not present

## 2022-08-20 DIAGNOSIS — I69391 Dysphagia following cerebral infarction: Secondary | ICD-10-CM | POA: Diagnosis not present

## 2022-08-20 DIAGNOSIS — I69352 Hemiplegia and hemiparesis following cerebral infarction affecting left dominant side: Secondary | ICD-10-CM | POA: Diagnosis not present

## 2022-08-20 DIAGNOSIS — E1165 Type 2 diabetes mellitus with hyperglycemia: Secondary | ICD-10-CM | POA: Diagnosis not present

## 2022-08-20 DIAGNOSIS — M6281 Muscle weakness (generalized): Secondary | ICD-10-CM | POA: Diagnosis not present

## 2022-08-20 DIAGNOSIS — R1312 Dysphagia, oropharyngeal phase: Secondary | ICD-10-CM | POA: Diagnosis not present

## 2022-08-22 DIAGNOSIS — E1165 Type 2 diabetes mellitus with hyperglycemia: Secondary | ICD-10-CM | POA: Diagnosis not present

## 2022-08-22 DIAGNOSIS — I69352 Hemiplegia and hemiparesis following cerebral infarction affecting left dominant side: Secondary | ICD-10-CM | POA: Diagnosis not present

## 2022-08-22 DIAGNOSIS — I69391 Dysphagia following cerebral infarction: Secondary | ICD-10-CM | POA: Diagnosis not present

## 2022-08-22 DIAGNOSIS — M6281 Muscle weakness (generalized): Secondary | ICD-10-CM | POA: Diagnosis not present

## 2022-08-22 DIAGNOSIS — R1312 Dysphagia, oropharyngeal phase: Secondary | ICD-10-CM | POA: Diagnosis not present

## 2022-08-25 DIAGNOSIS — E1165 Type 2 diabetes mellitus with hyperglycemia: Secondary | ICD-10-CM | POA: Diagnosis not present

## 2022-08-25 DIAGNOSIS — R1312 Dysphagia, oropharyngeal phase: Secondary | ICD-10-CM | POA: Diagnosis not present

## 2022-08-25 DIAGNOSIS — I69391 Dysphagia following cerebral infarction: Secondary | ICD-10-CM | POA: Diagnosis not present

## 2022-08-25 DIAGNOSIS — M6281 Muscle weakness (generalized): Secondary | ICD-10-CM | POA: Diagnosis not present

## 2022-08-25 DIAGNOSIS — I69352 Hemiplegia and hemiparesis following cerebral infarction affecting left dominant side: Secondary | ICD-10-CM | POA: Diagnosis not present

## 2022-08-27 ENCOUNTER — Ambulatory Visit: Payer: Medicare Other | Admitting: Nurse Practitioner

## 2022-10-14 DIAGNOSIS — I4891 Unspecified atrial fibrillation: Secondary | ICD-10-CM | POA: Diagnosis not present

## 2022-10-14 DIAGNOSIS — F329 Major depressive disorder, single episode, unspecified: Secondary | ICD-10-CM | POA: Diagnosis not present

## 2022-10-14 DIAGNOSIS — E118 Type 2 diabetes mellitus with unspecified complications: Secondary | ICD-10-CM | POA: Diagnosis not present

## 2022-10-14 DIAGNOSIS — E039 Hypothyroidism, unspecified: Secondary | ICD-10-CM | POA: Diagnosis not present

## 2022-10-16 DIAGNOSIS — E118 Type 2 diabetes mellitus with unspecified complications: Secondary | ICD-10-CM | POA: Diagnosis not present

## 2022-10-16 DIAGNOSIS — R634 Abnormal weight loss: Secondary | ICD-10-CM | POA: Diagnosis not present

## 2022-10-16 DIAGNOSIS — F329 Major depressive disorder, single episode, unspecified: Secondary | ICD-10-CM | POA: Diagnosis not present

## 2022-10-16 DIAGNOSIS — E039 Hypothyroidism, unspecified: Secondary | ICD-10-CM | POA: Diagnosis not present

## 2022-10-17 DIAGNOSIS — F329 Major depressive disorder, single episode, unspecified: Secondary | ICD-10-CM | POA: Diagnosis not present

## 2022-10-17 DIAGNOSIS — I4891 Unspecified atrial fibrillation: Secondary | ICD-10-CM | POA: Diagnosis not present

## 2022-10-17 DIAGNOSIS — E118 Type 2 diabetes mellitus with unspecified complications: Secondary | ICD-10-CM | POA: Diagnosis not present

## 2022-10-17 DIAGNOSIS — N390 Urinary tract infection, site not specified: Secondary | ICD-10-CM | POA: Diagnosis not present

## 2022-10-17 DIAGNOSIS — E039 Hypothyroidism, unspecified: Secondary | ICD-10-CM | POA: Diagnosis not present

## 2022-10-29 DIAGNOSIS — R2689 Other abnormalities of gait and mobility: Secondary | ICD-10-CM | POA: Diagnosis not present

## 2022-10-29 DIAGNOSIS — F0390 Unspecified dementia without behavioral disturbance: Secondary | ICD-10-CM | POA: Diagnosis not present

## 2022-10-29 DIAGNOSIS — I69352 Hemiplegia and hemiparesis following cerebral infarction affecting left dominant side: Secondary | ICD-10-CM | POA: Diagnosis not present

## 2022-10-29 DIAGNOSIS — R2681 Unsteadiness on feet: Secondary | ICD-10-CM | POA: Diagnosis not present

## 2022-10-29 DIAGNOSIS — R531 Weakness: Secondary | ICD-10-CM | POA: Diagnosis not present

## 2022-10-30 DIAGNOSIS — R2681 Unsteadiness on feet: Secondary | ICD-10-CM | POA: Diagnosis not present

## 2022-10-30 DIAGNOSIS — I69352 Hemiplegia and hemiparesis following cerebral infarction affecting left dominant side: Secondary | ICD-10-CM | POA: Diagnosis not present

## 2022-10-30 DIAGNOSIS — R2689 Other abnormalities of gait and mobility: Secondary | ICD-10-CM | POA: Diagnosis not present

## 2022-10-30 DIAGNOSIS — R531 Weakness: Secondary | ICD-10-CM | POA: Diagnosis not present

## 2022-10-30 DIAGNOSIS — F0390 Unspecified dementia without behavioral disturbance: Secondary | ICD-10-CM | POA: Diagnosis not present

## 2022-10-31 DIAGNOSIS — R531 Weakness: Secondary | ICD-10-CM | POA: Diagnosis not present

## 2022-10-31 DIAGNOSIS — I69352 Hemiplegia and hemiparesis following cerebral infarction affecting left dominant side: Secondary | ICD-10-CM | POA: Diagnosis not present

## 2022-10-31 DIAGNOSIS — F0390 Unspecified dementia without behavioral disturbance: Secondary | ICD-10-CM | POA: Diagnosis not present

## 2022-10-31 DIAGNOSIS — R2681 Unsteadiness on feet: Secondary | ICD-10-CM | POA: Diagnosis not present

## 2022-10-31 DIAGNOSIS — R2689 Other abnormalities of gait and mobility: Secondary | ICD-10-CM | POA: Diagnosis not present

## 2022-11-04 DIAGNOSIS — R2689 Other abnormalities of gait and mobility: Secondary | ICD-10-CM | POA: Diagnosis not present

## 2022-11-04 DIAGNOSIS — I69352 Hemiplegia and hemiparesis following cerebral infarction affecting left dominant side: Secondary | ICD-10-CM | POA: Diagnosis not present

## 2022-11-04 DIAGNOSIS — F0390 Unspecified dementia without behavioral disturbance: Secondary | ICD-10-CM | POA: Diagnosis not present

## 2022-11-04 DIAGNOSIS — R531 Weakness: Secondary | ICD-10-CM | POA: Diagnosis not present

## 2022-11-04 DIAGNOSIS — R2681 Unsteadiness on feet: Secondary | ICD-10-CM | POA: Diagnosis not present

## 2022-11-06 DIAGNOSIS — F0390 Unspecified dementia without behavioral disturbance: Secondary | ICD-10-CM | POA: Diagnosis not present

## 2022-11-06 DIAGNOSIS — R2689 Other abnormalities of gait and mobility: Secondary | ICD-10-CM | POA: Diagnosis not present

## 2022-11-06 DIAGNOSIS — I69352 Hemiplegia and hemiparesis following cerebral infarction affecting left dominant side: Secondary | ICD-10-CM | POA: Diagnosis not present

## 2022-11-06 DIAGNOSIS — R531 Weakness: Secondary | ICD-10-CM | POA: Diagnosis not present

## 2022-11-06 DIAGNOSIS — R2681 Unsteadiness on feet: Secondary | ICD-10-CM | POA: Diagnosis not present

## 2022-11-07 DIAGNOSIS — R2681 Unsteadiness on feet: Secondary | ICD-10-CM | POA: Diagnosis not present

## 2022-11-07 DIAGNOSIS — F0390 Unspecified dementia without behavioral disturbance: Secondary | ICD-10-CM | POA: Diagnosis not present

## 2022-11-07 DIAGNOSIS — I69352 Hemiplegia and hemiparesis following cerebral infarction affecting left dominant side: Secondary | ICD-10-CM | POA: Diagnosis not present

## 2022-11-07 DIAGNOSIS — R531 Weakness: Secondary | ICD-10-CM | POA: Diagnosis not present

## 2022-11-07 DIAGNOSIS — R2689 Other abnormalities of gait and mobility: Secondary | ICD-10-CM | POA: Diagnosis not present

## 2022-11-10 DIAGNOSIS — R531 Weakness: Secondary | ICD-10-CM | POA: Diagnosis not present

## 2022-11-10 DIAGNOSIS — I69352 Hemiplegia and hemiparesis following cerebral infarction affecting left dominant side: Secondary | ICD-10-CM | POA: Diagnosis not present

## 2022-11-10 DIAGNOSIS — R2689 Other abnormalities of gait and mobility: Secondary | ICD-10-CM | POA: Diagnosis not present

## 2022-11-10 DIAGNOSIS — F0390 Unspecified dementia without behavioral disturbance: Secondary | ICD-10-CM | POA: Diagnosis not present

## 2022-11-10 DIAGNOSIS — R2681 Unsteadiness on feet: Secondary | ICD-10-CM | POA: Diagnosis not present

## 2022-11-10 DIAGNOSIS — I10 Essential (primary) hypertension: Secondary | ICD-10-CM | POA: Diagnosis not present

## 2022-11-11 DIAGNOSIS — I69352 Hemiplegia and hemiparesis following cerebral infarction affecting left dominant side: Secondary | ICD-10-CM | POA: Diagnosis not present

## 2022-11-11 DIAGNOSIS — R2681 Unsteadiness on feet: Secondary | ICD-10-CM | POA: Diagnosis not present

## 2022-11-11 DIAGNOSIS — R531 Weakness: Secondary | ICD-10-CM | POA: Diagnosis not present

## 2022-11-11 DIAGNOSIS — F0390 Unspecified dementia without behavioral disturbance: Secondary | ICD-10-CM | POA: Diagnosis not present

## 2022-11-11 DIAGNOSIS — R2689 Other abnormalities of gait and mobility: Secondary | ICD-10-CM | POA: Diagnosis not present

## 2022-11-12 DIAGNOSIS — R2681 Unsteadiness on feet: Secondary | ICD-10-CM | POA: Diagnosis not present

## 2022-11-12 DIAGNOSIS — I69352 Hemiplegia and hemiparesis following cerebral infarction affecting left dominant side: Secondary | ICD-10-CM | POA: Diagnosis not present

## 2022-11-12 DIAGNOSIS — F0390 Unspecified dementia without behavioral disturbance: Secondary | ICD-10-CM | POA: Diagnosis not present

## 2022-11-12 DIAGNOSIS — R2689 Other abnormalities of gait and mobility: Secondary | ICD-10-CM | POA: Diagnosis not present

## 2022-11-12 DIAGNOSIS — R531 Weakness: Secondary | ICD-10-CM | POA: Diagnosis not present

## 2022-11-13 DIAGNOSIS — F0390 Unspecified dementia without behavioral disturbance: Secondary | ICD-10-CM | POA: Diagnosis not present

## 2022-11-13 DIAGNOSIS — I69352 Hemiplegia and hemiparesis following cerebral infarction affecting left dominant side: Secondary | ICD-10-CM | POA: Diagnosis not present

## 2022-11-13 DIAGNOSIS — R2689 Other abnormalities of gait and mobility: Secondary | ICD-10-CM | POA: Diagnosis not present

## 2022-11-13 DIAGNOSIS — I10 Essential (primary) hypertension: Secondary | ICD-10-CM | POA: Diagnosis not present

## 2022-11-13 DIAGNOSIS — R2681 Unsteadiness on feet: Secondary | ICD-10-CM | POA: Diagnosis not present

## 2022-11-13 DIAGNOSIS — R531 Weakness: Secondary | ICD-10-CM | POA: Diagnosis not present

## 2022-11-14 DIAGNOSIS — F0390 Unspecified dementia without behavioral disturbance: Secondary | ICD-10-CM | POA: Diagnosis not present

## 2022-11-14 DIAGNOSIS — R2689 Other abnormalities of gait and mobility: Secondary | ICD-10-CM | POA: Diagnosis not present

## 2022-11-14 DIAGNOSIS — R2681 Unsteadiness on feet: Secondary | ICD-10-CM | POA: Diagnosis not present

## 2022-11-14 DIAGNOSIS — R531 Weakness: Secondary | ICD-10-CM | POA: Diagnosis not present

## 2022-11-14 DIAGNOSIS — I69352 Hemiplegia and hemiparesis following cerebral infarction affecting left dominant side: Secondary | ICD-10-CM | POA: Diagnosis not present

## 2022-11-14 DIAGNOSIS — E039 Hypothyroidism, unspecified: Secondary | ICD-10-CM | POA: Diagnosis not present

## 2022-11-14 DIAGNOSIS — F329 Major depressive disorder, single episode, unspecified: Secondary | ICD-10-CM | POA: Diagnosis not present

## 2022-11-17 DIAGNOSIS — I69352 Hemiplegia and hemiparesis following cerebral infarction affecting left dominant side: Secondary | ICD-10-CM | POA: Diagnosis not present

## 2022-11-17 DIAGNOSIS — R2689 Other abnormalities of gait and mobility: Secondary | ICD-10-CM | POA: Diagnosis not present

## 2022-11-17 DIAGNOSIS — F0390 Unspecified dementia without behavioral disturbance: Secondary | ICD-10-CM | POA: Diagnosis not present

## 2022-11-17 DIAGNOSIS — R531 Weakness: Secondary | ICD-10-CM | POA: Diagnosis not present

## 2022-11-17 DIAGNOSIS — R2681 Unsteadiness on feet: Secondary | ICD-10-CM | POA: Diagnosis not present

## 2022-11-18 DIAGNOSIS — R531 Weakness: Secondary | ICD-10-CM | POA: Diagnosis not present

## 2022-11-18 DIAGNOSIS — I69352 Hemiplegia and hemiparesis following cerebral infarction affecting left dominant side: Secondary | ICD-10-CM | POA: Diagnosis not present

## 2022-11-18 DIAGNOSIS — R2689 Other abnormalities of gait and mobility: Secondary | ICD-10-CM | POA: Diagnosis not present

## 2022-11-18 DIAGNOSIS — R2681 Unsteadiness on feet: Secondary | ICD-10-CM | POA: Diagnosis not present

## 2022-11-18 DIAGNOSIS — F0390 Unspecified dementia without behavioral disturbance: Secondary | ICD-10-CM | POA: Diagnosis not present

## 2022-11-19 DIAGNOSIS — R531 Weakness: Secondary | ICD-10-CM | POA: Diagnosis not present

## 2022-11-19 DIAGNOSIS — F0390 Unspecified dementia without behavioral disturbance: Secondary | ICD-10-CM | POA: Diagnosis not present

## 2022-11-19 DIAGNOSIS — R2689 Other abnormalities of gait and mobility: Secondary | ICD-10-CM | POA: Diagnosis not present

## 2022-11-19 DIAGNOSIS — I69352 Hemiplegia and hemiparesis following cerebral infarction affecting left dominant side: Secondary | ICD-10-CM | POA: Diagnosis not present

## 2022-11-19 DIAGNOSIS — R2681 Unsteadiness on feet: Secondary | ICD-10-CM | POA: Diagnosis not present

## 2022-11-20 DIAGNOSIS — R2689 Other abnormalities of gait and mobility: Secondary | ICD-10-CM | POA: Diagnosis not present

## 2022-11-20 DIAGNOSIS — F0390 Unspecified dementia without behavioral disturbance: Secondary | ICD-10-CM | POA: Diagnosis not present

## 2022-11-20 DIAGNOSIS — I69352 Hemiplegia and hemiparesis following cerebral infarction affecting left dominant side: Secondary | ICD-10-CM | POA: Diagnosis not present

## 2022-11-20 DIAGNOSIS — R531 Weakness: Secondary | ICD-10-CM | POA: Diagnosis not present

## 2022-11-20 DIAGNOSIS — R2681 Unsteadiness on feet: Secondary | ICD-10-CM | POA: Diagnosis not present

## 2022-11-21 DIAGNOSIS — R059 Cough, unspecified: Secondary | ICD-10-CM | POA: Diagnosis not present

## 2022-11-22 ENCOUNTER — Emergency Department (HOSPITAL_COMMUNITY): Payer: Medicare Other

## 2022-11-22 ENCOUNTER — Inpatient Hospital Stay (HOSPITAL_COMMUNITY)
Admission: EM | Admit: 2022-11-22 | Discharge: 2022-11-28 | DRG: 871 | Disposition: A | Discharge status: Skilled Nursing Facility | Payer: Medicare Other | Attending: Internal Medicine | Admitting: Internal Medicine

## 2022-11-22 ENCOUNTER — Other Ambulatory Visit: Payer: Self-pay

## 2022-11-22 ENCOUNTER — Encounter (HOSPITAL_COMMUNITY): Payer: Self-pay | Admitting: Emergency Medicine

## 2022-11-22 DIAGNOSIS — R627 Adult failure to thrive: Secondary | ICD-10-CM | POA: Diagnosis present

## 2022-11-22 DIAGNOSIS — F329 Major depressive disorder, single episode, unspecified: Secondary | ICD-10-CM | POA: Diagnosis present

## 2022-11-22 DIAGNOSIS — I672 Cerebral atherosclerosis: Secondary | ICD-10-CM | POA: Diagnosis not present

## 2022-11-22 DIAGNOSIS — A4159 Other Gram-negative sepsis: Secondary | ICD-10-CM | POA: Diagnosis not present

## 2022-11-22 DIAGNOSIS — Z1152 Encounter for screening for COVID-19: Secondary | ICD-10-CM

## 2022-11-22 DIAGNOSIS — Z886 Allergy status to analgesic agent status: Secondary | ICD-10-CM

## 2022-11-22 DIAGNOSIS — Z7401 Bed confinement status: Secondary | ICD-10-CM | POA: Diagnosis not present

## 2022-11-22 DIAGNOSIS — Z9049 Acquired absence of other specified parts of digestive tract: Secondary | ICD-10-CM

## 2022-11-22 DIAGNOSIS — G9341 Metabolic encephalopathy: Secondary | ICD-10-CM | POA: Diagnosis present

## 2022-11-22 DIAGNOSIS — Z8619 Personal history of other infectious and parasitic diseases: Secondary | ICD-10-CM | POA: Diagnosis not present

## 2022-11-22 DIAGNOSIS — N3000 Acute cystitis without hematuria: Secondary | ICD-10-CM | POA: Diagnosis not present

## 2022-11-22 DIAGNOSIS — F0150 Vascular dementia without behavioral disturbance: Secondary | ICD-10-CM

## 2022-11-22 DIAGNOSIS — Z823 Family history of stroke: Secondary | ICD-10-CM

## 2022-11-22 DIAGNOSIS — Z794 Long term (current) use of insulin: Secondary | ICD-10-CM

## 2022-11-22 DIAGNOSIS — Z7984 Long term (current) use of oral hypoglycemic drugs: Secondary | ICD-10-CM | POA: Diagnosis not present

## 2022-11-22 DIAGNOSIS — Z807 Family history of other malignant neoplasms of lymphoid, hematopoietic and related tissues: Secondary | ICD-10-CM

## 2022-11-22 DIAGNOSIS — E785 Hyperlipidemia, unspecified: Secondary | ICD-10-CM | POA: Diagnosis present

## 2022-11-22 DIAGNOSIS — Z8673 Personal history of transient ischemic attack (TIA), and cerebral infarction without residual deficits: Secondary | ICD-10-CM

## 2022-11-22 DIAGNOSIS — R9431 Abnormal electrocardiogram [ECG] [EKG]: Secondary | ICD-10-CM | POA: Diagnosis not present

## 2022-11-22 DIAGNOSIS — Z79890 Hormone replacement therapy (postmenopausal): Secondary | ICD-10-CM

## 2022-11-22 DIAGNOSIS — E860 Dehydration: Secondary | ICD-10-CM | POA: Diagnosis not present

## 2022-11-22 DIAGNOSIS — H9190 Unspecified hearing loss, unspecified ear: Secondary | ICD-10-CM | POA: Diagnosis not present

## 2022-11-22 DIAGNOSIS — Z91013 Allergy to seafood: Secondary | ICD-10-CM

## 2022-11-22 DIAGNOSIS — E1165 Type 2 diabetes mellitus with hyperglycemia: Secondary | ICD-10-CM | POA: Diagnosis not present

## 2022-11-22 DIAGNOSIS — E871 Hypo-osmolality and hyponatremia: Secondary | ICD-10-CM | POA: Diagnosis not present

## 2022-11-22 DIAGNOSIS — N3001 Acute cystitis with hematuria: Secondary | ICD-10-CM | POA: Diagnosis not present

## 2022-11-22 DIAGNOSIS — R059 Cough, unspecified: Secondary | ICD-10-CM | POA: Diagnosis not present

## 2022-11-22 DIAGNOSIS — R6520 Severe sepsis without septic shock: Secondary | ICD-10-CM | POA: Diagnosis present

## 2022-11-22 DIAGNOSIS — I6782 Cerebral ischemia: Secondary | ICD-10-CM | POA: Diagnosis not present

## 2022-11-22 DIAGNOSIS — J9811 Atelectasis: Secondary | ICD-10-CM | POA: Diagnosis not present

## 2022-11-22 DIAGNOSIS — R4182 Altered mental status, unspecified: Secondary | ICD-10-CM | POA: Diagnosis not present

## 2022-11-22 DIAGNOSIS — Z8249 Family history of ischemic heart disease and other diseases of the circulatory system: Secondary | ICD-10-CM | POA: Diagnosis not present

## 2022-11-22 DIAGNOSIS — Z888 Allergy status to other drugs, medicaments and biological substances status: Secondary | ICD-10-CM

## 2022-11-22 DIAGNOSIS — I480 Paroxysmal atrial fibrillation: Secondary | ICD-10-CM | POA: Diagnosis not present

## 2022-11-22 DIAGNOSIS — Z91040 Latex allergy status: Secondary | ICD-10-CM

## 2022-11-22 DIAGNOSIS — G319 Degenerative disease of nervous system, unspecified: Secondary | ICD-10-CM | POA: Diagnosis not present

## 2022-11-22 DIAGNOSIS — Z90710 Acquired absence of both cervix and uterus: Secondary | ICD-10-CM

## 2022-11-22 DIAGNOSIS — E038 Other specified hypothyroidism: Secondary | ICD-10-CM | POA: Diagnosis not present

## 2022-11-22 DIAGNOSIS — Z882 Allergy status to sulfonamides status: Secondary | ICD-10-CM

## 2022-11-22 DIAGNOSIS — N390 Urinary tract infection, site not specified: Principal | ICD-10-CM

## 2022-11-22 DIAGNOSIS — E039 Hypothyroidism, unspecified: Secondary | ICD-10-CM | POA: Diagnosis not present

## 2022-11-22 DIAGNOSIS — Z87440 Personal history of urinary (tract) infections: Secondary | ICD-10-CM

## 2022-11-22 DIAGNOSIS — Z803 Family history of malignant neoplasm of breast: Secondary | ICD-10-CM

## 2022-11-22 DIAGNOSIS — Z8349 Family history of other endocrine, nutritional and metabolic diseases: Secondary | ICD-10-CM

## 2022-11-22 DIAGNOSIS — R0989 Other specified symptoms and signs involving the circulatory and respiratory systems: Secondary | ICD-10-CM | POA: Diagnosis not present

## 2022-11-22 DIAGNOSIS — F0393 Unspecified dementia, unspecified severity, with mood disturbance: Secondary | ICD-10-CM | POA: Diagnosis present

## 2022-11-22 DIAGNOSIS — Z79899 Other long term (current) drug therapy: Secondary | ICD-10-CM

## 2022-11-22 DIAGNOSIS — I69352 Hemiplegia and hemiparesis following cerebral infarction affecting left dominant side: Secondary | ICD-10-CM | POA: Diagnosis not present

## 2022-11-22 DIAGNOSIS — M797 Fibromyalgia: Secondary | ICD-10-CM | POA: Diagnosis not present

## 2022-11-22 DIAGNOSIS — E876 Hypokalemia: Secondary | ICD-10-CM | POA: Diagnosis not present

## 2022-11-22 DIAGNOSIS — R531 Weakness: Secondary | ICD-10-CM | POA: Diagnosis not present

## 2022-11-22 DIAGNOSIS — N3010 Interstitial cystitis (chronic) without hematuria: Secondary | ICD-10-CM | POA: Diagnosis present

## 2022-11-22 DIAGNOSIS — G9340 Encephalopathy, unspecified: Secondary | ICD-10-CM | POA: Diagnosis not present

## 2022-11-22 DIAGNOSIS — I10 Essential (primary) hypertension: Secondary | ICD-10-CM | POA: Diagnosis present

## 2022-11-22 DIAGNOSIS — F0390 Unspecified dementia without behavioral disturbance: Secondary | ICD-10-CM | POA: Diagnosis present

## 2022-11-22 DIAGNOSIS — R739 Hyperglycemia, unspecified: Secondary | ICD-10-CM | POA: Diagnosis not present

## 2022-11-22 DIAGNOSIS — Z7901 Long term (current) use of anticoagulants: Secondary | ICD-10-CM

## 2022-11-22 DIAGNOSIS — Z860101 Personal history of adenomatous and serrated colon polyps: Secondary | ICD-10-CM

## 2022-11-22 DIAGNOSIS — I959 Hypotension, unspecified: Secondary | ICD-10-CM | POA: Diagnosis not present

## 2022-11-22 LAB — COMPREHENSIVE METABOLIC PANEL
ALT: 12 U/L (ref 0–44)
AST: 17 U/L (ref 15–41)
Albumin: 2.9 g/dL — ABNORMAL LOW (ref 3.5–5.0)
Alkaline Phosphatase: 75 U/L (ref 38–126)
Anion gap: 11 (ref 5–15)
BUN: 14 mg/dL (ref 8–23)
CO2: 25 mmol/L (ref 22–32)
Calcium: 9 mg/dL (ref 8.9–10.3)
Chloride: 96 mmol/L — ABNORMAL LOW (ref 98–111)
Creatinine, Ser: 0.89 mg/dL (ref 0.44–1.00)
GFR, Estimated: >60 mL/min (ref >60)
Glucose, Bld: 207 mg/dL — ABNORMAL HIGH (ref 70–99)
Potassium: 3.9 mmol/L (ref 3.5–5.1)
Sodium: 132 mmol/L — ABNORMAL LOW (ref 135–145)
Total Bilirubin: 0.9 mg/dL (ref 0.3–1.2)
Total Protein: 7.2 g/dL (ref 6.5–8.1)

## 2022-11-22 LAB — CBC WITH DIFFERENTIAL/PLATELET
Abs Immature Granulocytes: 0.08 10*3/uL — ABNORMAL HIGH (ref 0.00–0.07)
Basophils Absolute: 0 10*3/uL (ref 0.0–0.1)
Basophils Relative: 0 %
Eosinophils Absolute: 0 10*3/uL (ref 0.0–0.5)
Eosinophils Relative: 0 %
HCT: 31.8 % — ABNORMAL LOW (ref 36.0–46.0)
Hemoglobin: 10.3 g/dL — ABNORMAL LOW (ref 12.0–15.0)
Immature Granulocytes: 1 %
Lymphocytes Relative: 10 %
Lymphs Abs: 1.4 10*3/uL (ref 0.7–4.0)
MCH: 29.3 pg (ref 26.0–34.0)
MCHC: 32.4 g/dL (ref 30.0–36.0)
MCV: 90.6 fL (ref 80.0–100.0)
Monocytes Absolute: 1.7 10*3/uL — ABNORMAL HIGH (ref 0.1–1.0)
Monocytes Relative: 12 %
Neutro Abs: 10.2 10*3/uL — ABNORMAL HIGH (ref 1.7–7.7)
Neutrophils Relative %: 77 %
Platelets: 232 10*3/uL (ref 150–400)
RBC: 3.51 MIL/uL — ABNORMAL LOW (ref 3.87–5.11)
RDW: 14.5 % (ref 11.5–15.5)
WBC: 13.4 10*3/uL — ABNORMAL HIGH (ref 4.0–10.5)
nRBC: 0 % (ref 0.0–0.2)

## 2022-11-22 LAB — URINALYSIS, W/ REFLEX TO CULTURE (INFECTION SUSPECTED)
Bilirubin Urine: NEGATIVE
Glucose, UA: NEGATIVE mg/dL
Ketones, ur: NEGATIVE mg/dL
Nitrite: POSITIVE — AB
Protein, ur: 30 mg/dL — AB
Specific Gravity, Urine: >1.03 — ABNORMAL HIGH (ref 1.005–1.030)
pH: 6 (ref 5.0–8.0)

## 2022-11-22 LAB — CBG MONITORING, ED: Glucose-Capillary: 180 mg/dL — ABNORMAL HIGH (ref 70–99)

## 2022-11-22 LAB — I-STAT CG4 LACTIC ACID, ED: Lactic Acid, Venous: 2.7 mmol/L (ref 0.5–1.9)

## 2022-11-22 LAB — SARS CORONAVIRUS 2 BY RT PCR: SARS Coronavirus 2 by RT PCR: NEGATIVE

## 2022-11-22 MED ORDER — ACETAMINOPHEN 325 MG PO TABS
650.0000 mg | ORAL_TABLET | Freq: Four times a day (QID) | ORAL | Status: DC | PRN
  Administered 2022-11-27: 650 mg via ORAL
  Filled 2022-11-22: qty 2

## 2022-11-22 MED ORDER — APIXABAN 2.5 MG PO TABS
2.5000 mg | ORAL_TABLET | Freq: Two times a day (BID) | ORAL | Status: DC
  Administered 2022-11-23 – 2022-11-28 (×11): 2.5 mg via ORAL
  Filled 2022-11-22 (×11): qty 1

## 2022-11-22 MED ORDER — SODIUM CHLORIDE 0.9 % IV BOLUS
500.0000 mL | Freq: Once | INTRAVENOUS | Status: AC
  Administered 2022-11-23: 500 mL via INTRAVENOUS

## 2022-11-22 MED ORDER — ACETAMINOPHEN 650 MG RE SUPP: 650.0000 mg | Freq: Four times a day (QID) | RECTAL | Status: DC | PRN

## 2022-11-22 MED ORDER — LEVOTHYROXINE SODIUM 75 MCG PO TABS
75.0000 ug | ORAL_TABLET | Freq: Every day | ORAL | Status: DC
  Administered 2022-11-23 – 2022-11-28 (×6): 75 ug via ORAL
  Filled 2022-11-22 (×6): qty 1

## 2022-11-22 MED ORDER — POLYETHYLENE GLYCOL 3350 17 G PO PACK: 17.0000 g | PACK | Freq: Every day | ORAL | Status: DC | PRN

## 2022-11-22 MED ORDER — GUAIFENESIN 100 MG/5ML PO LIQD
5.0000 mL | ORAL | Status: DC | PRN
  Administered 2022-11-23: 5 mL via ORAL
  Filled 2022-11-22: qty 15

## 2022-11-22 MED ORDER — INSULIN GLARGINE-YFGN 100 UNIT/ML ~~LOC~~ SOLN
5.0000 [IU] | Freq: Every day | SUBCUTANEOUS | Status: DC
  Administered 2022-11-23 – 2022-11-24 (×2): 5 [IU] via SUBCUTANEOUS
  Filled 2022-11-22 (×3): qty 0.05

## 2022-11-22 MED ORDER — SODIUM CHLORIDE 0.9 % IV SOLN
1.0000 g | INTRAVENOUS | Status: DC
  Administered 2022-11-23 – 2022-11-24 (×2): 1 g via INTRAVENOUS
  Filled 2022-11-22 (×2): qty 10

## 2022-11-22 MED ORDER — SODIUM CHLORIDE 0.9 % IV BOLUS
1000.0000 mL | Freq: Once | INTRAVENOUS | Status: AC
  Administered 2022-11-22: 1000 mL via INTRAVENOUS

## 2022-11-22 MED ORDER — BISACODYL 10 MG RE SUPP: 10.0000 mg | Freq: Every day | RECTAL | Status: DC | PRN

## 2022-11-22 MED ORDER — SODIUM CHLORIDE 0.9 % IV SOLN
1.0000 g | Freq: Once | INTRAVENOUS | Status: AC
  Administered 2022-11-22: 1 g via INTRAVENOUS
  Filled 2022-11-22: qty 10

## 2022-11-22 MED ORDER — INSULIN ASPART 100 UNIT/ML IJ SOLN
0.0000 [IU] | Freq: Three times a day (TID) | INTRAMUSCULAR | Status: DC
  Administered 2022-11-23: 11 [IU] via SUBCUTANEOUS
  Administered 2022-11-23: 15 [IU] via SUBCUTANEOUS
  Administered 2022-11-23: 5 [IU] via SUBCUTANEOUS
  Administered 2022-11-24: 15 [IU] via SUBCUTANEOUS
  Administered 2022-11-24: 8 [IU] via SUBCUTANEOUS
  Administered 2022-11-24: 11 [IU] via SUBCUTANEOUS
  Administered 2022-11-25: 3 [IU] via SUBCUTANEOUS
  Administered 2022-11-25: 8 [IU] via SUBCUTANEOUS
  Administered 2022-11-25 – 2022-11-26 (×3): 5 [IU] via SUBCUTANEOUS
  Administered 2022-11-27 (×2): 3 [IU] via SUBCUTANEOUS
  Administered 2022-11-27: 5 [IU] via SUBCUTANEOUS
  Administered 2022-11-28: 3 [IU] via SUBCUTANEOUS

## 2022-11-22 MED ORDER — DULOXETINE HCL 30 MG PO CPEP
30.0000 mg | ORAL_CAPSULE | Freq: Every day | ORAL | Status: DC
  Administered 2022-11-23 – 2022-11-28 (×6): 30 mg via ORAL
  Filled 2022-11-22 (×6): qty 1

## 2022-11-22 MED ORDER — ONDANSETRON HCL 4 MG PO TABS: 4.0000 mg | ORAL_TABLET | Freq: Four times a day (QID) | ORAL | Status: DC | PRN

## 2022-11-22 MED ORDER — SODIUM CHLORIDE 0.9 % IV SOLN: INTRAVENOUS | Status: DC

## 2022-11-22 MED ORDER — ONDANSETRON HCL 4 MG/2ML IJ SOLN: 4.0000 mg | Freq: Four times a day (QID) | INTRAMUSCULAR | Status: DC | PRN

## 2022-11-22 MED ORDER — INSULIN ASPART 100 UNIT/ML IJ SOLN
0.0000 [IU] | Freq: Every day | INTRAMUSCULAR | Status: DC
  Administered 2022-11-23 (×2): 2 [IU] via SUBCUTANEOUS
  Administered 2022-11-24: 4 [IU] via SUBCUTANEOUS
  Administered 2022-11-25 – 2022-11-26 (×2): 2 [IU] via SUBCUTANEOUS
  Administered 2022-11-27: 3 [IU] via SUBCUTANEOUS

## 2022-11-22 NOTE — ED Provider Notes (Addendum)
[place] EMERGENCY DEPARTMENT AT [facility] Provider Note   CSN: [id number] Arrival date & time: 11/22/22  1909     History  Chief Complaint  Patient presents with   Altered Mental Status    Jaclyn [name] is a 87 y.o. female.  HPI 87 year old female presents with altered mental status.  Similar to when she has had UTIs before.  This has been ongoing for about 5 days where she has been loopy her than normal but also combative.  She is also had a little bit of a cough/URI symptoms.  No reports of fevers. History is primarily from son over the phone.  Home Medications Prior to Admission medications   Medication Sig Start Date End Date Taking? Authorizing Provider  B-D ULTRAFINE III SHORT PEN 31G X 8 MM MISC USE TO INJECT INSULIN ONCE DAILY. DX: E11.65 07/02/20   Bedsole, Amy E, MD  bisacodyl (DULCOLAX) 10 MG suppository Place 10 mg rectally daily as needed for moderate constipation.    [provider]  Cholecalciferol (VITAMIN D3) 1.25 MG (50000 UT) CAPS Take 1 capsule by mouth once a week. Patient taking differently: Take 50,000 Units by mouth once a week. Monday 12/27/20   Excell Seltzer, MD  docusate sodium (COLACE) 100 MG capsule Take 100 mg by mouth 2 (two) times daily.    [provider]  DULoxetine (CYMBALTA) 60 MG capsule Take 60 mg by mouth daily. 01/13/22   [provider]  ELIQUIS 2.5 MG TABS tablet Take 2.5 mg by mouth 2 (two) times daily. 11/01/21   [provider]  HUMALOG KWIKPEN 100 UNIT/ML KwikPen Inject 2-12 Units into the skin in the morning, at noon, in the evening, and at bedtime. Per sliding scale: 140-199= 2 units, 200-250= 6 units, 251-299= 8 units, 300-349= 10 units, 350-400= 12 units, 01/13/22   [provider]  JANUVIA 100 MG tablet TAKE 1 TABLET BY MOUTH EVERY DAY Patient taking differently: Take 100 mg by mouth daily. 01/07/21   Bedsole, Amy E, MD  levothyroxine (SYNTHROID) 150 MCG tablet Take 150 mcg  by mouth daily. 01/13/22   [provider]  magnesium hydroxide (MILK OF MAGNESIA) 400 MG/5ML suspension Take 30 mLs by mouth daily as needed for mild constipation.    [provider]  metFORMIN (GLUCOPHAGE) 1000 MG tablet Take 1,000 mg by mouth 2 (two) times daily. 10/28/21   [provider]  Nutritional Supplements (NUTRITIONAL SHAKE PLUS) LIQD Take 1 Container by mouth daily.    [provider]  Omega-3 Fatty Acids (FISH OIL) 1000 MG CAPS Take 1,000 mg by mouth daily.    [provider]  Sodium Phosphates (RA SALINE ENEMA RE) Place 1 Dose rectally daily as needed (constipation).    [provider]      Allergies    Naproxen sodium, Statins, Sulfonamide derivatives, Latex, and Shellfish allergy    Review of Systems   Review of Systems  Physical Exam Updated Vital Signs BP 121/66   Pulse 91   Temp 100.1 F (37.8 C) (Rectal)   Resp 19   Ht 5\' 6"  (1.676 m)   Wt 59.4 kg   SpO2 96%   BMI 21.14 kg/m  Physical Exam Vitals and nursing note reviewed.  Constitutional:      Appearance: She is well-developed.  HENT:     Head: Normocephalic and atraumatic.  Cardiovascular:     Rate and Rhythm: Normal rate and regular rhythm.     Heart  sounds: Normal heart sounds.  Pulmonary:     Effort: Pulmonary effort is normal.     Breath sounds: Normal breath sounds.  Abdominal:     General: There is no distension.  Skin:    General: Skin is warm and dry.  Neurological:     Mental Status: She is alert.     ED Results / Procedures / Treatments   Labs (all labs ordered are listed, but only abnormal results are displayed) Labs Reviewed  COMPREHENSIVE METABOLIC PANEL - Abnormal; Notable for the following components:      Result Value   Sodium 132 (*)    Chloride 96 (*)    Glucose, Bld 207 (*)    Albumin 2.9 (*)    All other components within normal limits  CBC WITH DIFFERENTIAL/PLATELET - Abnormal; Notable for the following  components:   WBC 13.4 (*)    RBC 3.51 (*)    Hemoglobin 10.3 (*)    HCT 31.8 (*)    Neutro Abs 10.2 (*)    Monocytes Absolute 1.7 (*)    Abs Immature Granulocytes 0.08 (*)    All other components within normal limits  URINALYSIS, W/ REFLEX TO CULTURE (INFECTION SUSPECTED) - Abnormal; Notable for the following components:   Specific Gravity, Urine >1.030 (*)    Hgb urine dipstick SMALL (*)    Protein, ur 30 (*)    Nitrite POSITIVE (*)    Leukocytes,Ua TRACE (*)    Bacteria, UA MANY (*)    All other components within normal limits  CBG MONITORING, ED - Abnormal; Notable for the following components:   Glucose-Capillary 180 (*)    All other components within normal limits  I-STAT CG4 LACTIC ACID, ED - Abnormal; Notable for the following components:   Lactic Acid, Venous 2.7 (*)    All other components within normal limits  SARS CORONAVIRUS 2 BY RT PCR  CULTURE, BLOOD (ROUTINE X 2)  CULTURE, BLOOD (ROUTINE X 2)    EKG None  Radiology CT Head Wo Contrast  Result Date: 11/22/2022 CLINICAL DATA:  Mental status change EXAM: CT HEAD WITHOUT CONTRAST TECHNIQUE: Contiguous axial images were obtained from the base of the skull through the vertex without intravenous contrast. RADIATION DOSE REDUCTION: This exam was performed according to the departmental dose-optimization program which includes automated exposure control, adjustment of the mA and/or kV according to patient size and/or use of iterative reconstruction technique. COMPARISON:  CT and MRI head 01/09/2022 FINDINGS: Brain: No intracranial hemorrhage, mass effect, or evidence of acute infarct. No hydrocephalus. No extra-axial fluid collection. Generalized cerebral atrophy and chronic small vessel ischemic disease. Vascular: No hyperdense vessel. Intracranial arterial calcification. Skull: No fracture or focal lesion. Sinuses/Orbits: Mucosal thickening in the ethmoid air cells in the sphenoid sinuses, increased from 01/09/2022. Other:  None. IMPRESSION: No acute intracranial abnormality. Electronically Signed   By: Minerva Fester M.D.   On: 11/22/2022 22:25   DG Chest Portable 1 View  Result Date: 11/22/2022 CLINICAL DATA:  Altered mental status and cough EXAM: PORTABLE CHEST 1 VIEW COMPARISON:  Radiograph 01/24/2022 FINDINGS: The heart size and mediastinal contours are within normal limits. Low lung volumes. Left basilar atelectasis. No focal consolidation, pleural effusion, or pneumothorax. No displaced rib fractures. IMPRESSION: No active disease. Electronically Signed   By: Minerva Fester M.D.   On: 11/22/2022 21:54    Procedures Procedures    Medications Ordered in ED Medications  sodium chloride 0.9 % bolus 1,000 mL (1,000 mLs Intravenous New Bag/Given  11/22/22 2045)  cefTRIAXone (ROCEPHIN) 1 g in sodium chloride 0.9 % 100 mL IVPB (1 g Intravenous New Bag/Given 11/22/22 2152)    ED Course/ Medical Decision Making/ A&P                                 Medical Decision Making Amount and/or Complexity of Data Reviewed Labs:     Details: Elevated WBC and lactic acid.  UA positive for nitrites. Radiology: ordered and independent interpretation performed.    Details: No head bleed  Risk Decision regarding hospitalization.   Patient has elevated white count and lactic acid but her vital signs are unremarkable including normal blood pressures.  She has a borderline fever but not quite.  However I do suspect that her change in mental status is from UTI and so she was given IV Rocephin.  Discussed with son, will need admission.  Discussed with hospitalist, Dr. Joneen Roach, for admission.        Final Clinical Impression(s) / ED Diagnoses Final diagnoses:  Acute urinary tract infection    Rx / DC Orders ED Discharge Orders     None         Pricilla Loveless, MD 11/22/22 1478    Pricilla Loveless, MD 12/01/22 [phone]

## 2022-11-22 NOTE — H&P (Signed)
PCP:   Excell Seltzer, MD   Chief Complaint:  Acute confusion  HPI: This is a 87 year old female with past medical history significant paroxysmal atrial fibrillation on Eliquis, insulin-dependent T2DM, hypothyroidism, interstitial cystitis on chronic suppressive antibiotic therapy, dementia, hard of hearing.  Patient is a resident of [facility].  Per son who is present at bedside, on Tuesday patient started becoming confused.  He states this is how she acts when she has a UTI.  Additionally, she has a cough and a hoarse throat.  He denies nausea, vomiting, diarrhea, fevers or chills.  He reports that she became to the ER.  In the ER vital stable Tmax 100.1.  Lactic acid 2.7, WBCs 13.4, glucose 207, COVID-negative, UA suggestive of UTI.  CXR clear  Review of Systems:  Per HPI.  Past Medical History: Past Medical History:  Diagnosis Date   Atrial fibrillation (HCC)    Benign neoplasm of colon    Carotid artery occlusion    60-79% right ICA stenosis   Difficult intubation 02/17/2006   During surgery to remove large polyp   Diverticulosis of colon (without mention of hemorrhage)    Dysthymic disorder    Fibromyalgia    Headache(784.0)    Insomnia, unspecified    Interstitial cystitis    Osteoarthrosis, unspecified whether generalized or localized, unspecified site    Other and unspecified hyperlipidemia    Other chest pain    Other specified benign mammary dysplasias    TIA (transient ischemic attack)    Type II or unspecified type diabetes mellitus without mention of complication, not stated as uncontrolled    Unspecified essential hypertension    Unspecified hypothyroidism    Unspecified vitamin D deficiency    Urinary tract infection, site not specified    Past Surgical History:  Procedure Laterality Date   ABDOMINAL HYSTERECTOMY  1988   cervical dysplasia   CARDIAC CATHETERIZATION  02/19/06   EF 60%   COLONOSCOPY     ENDARTERECTOMY Right 08/26/2019    Procedure: RIGHT CAROTID ENDARTERECTOMY;  Surgeon: Larina Earthly, MD;  Location: [facility] OR;  Service: Vascular;  Laterality: Right;   PATCH ANGIOPLASTY Right 08/26/2019   Procedure: PATCH ANGIOPLASTY OF RIGHT COMMON CAROTID ARTERY USING HEMASHIELD PLATINUM FINESSE PATCH;  Surgeon: Larina Earthly, MD;  Location: [facility] OR;  Service: Vascular;  Laterality: Right;   RIGHT COLECTOMY  2005   for villous adenoma of the cecum Dr.streck   VESICOVAGINAL FISTULA CLOSURE W/ TAH  1990   w/ cystocele &retocele repairs Dr. Elana Alm    Medications: Prior to Admission medications   Medication Sig Start Date End Date Taking? Authorizing Provider  B-D ULTRAFINE III SHORT PEN 31G X 8 MM MISC USE TO INJECT INSULIN ONCE DAILY. DX: E11.65 07/02/20   Bedsole, Amy E, MD  bisacodyl (DULCOLAX) 10 MG suppository Place 10 mg rectally daily as needed for moderate constipation.    [provider]  Cholecalciferol (VITAMIN D3) 1.25 MG (50000 UT) CAPS Take 1 capsule by mouth once a week. Patient taking differently: Take 50,000 Units by mouth once a week. Monday 12/27/20   Excell Seltzer, MD  docusate sodium (COLACE) 100 MG capsule Take 100 mg by mouth 2 (two) times daily.    [provider]  DULoxetine (CYMBALTA) 60 MG capsule Take 60 mg by mouth daily. 01/13/22   [provider]  ELIQUIS 2.5 MG TABS tablet Take 2.5 mg by mouth 2 (two) times daily. 11/01/21   [provider]  HUMALOG KWIKPEN 100 UNIT/ML KwikPen Inject 2-12 Units into the skin in the morning, at noon, in the evening, and at bedtime. Per sliding scale: 140-199= 2 units, 200-250= 6 units, 251-299= 8 units, 300-349= 10 units, 350-400= 12 units, 01/13/22   [provider]  JANUVIA 100 MG tablet TAKE 1 TABLET BY MOUTH EVERY DAY Patient taking differently: Take 100 mg by mouth daily. 01/07/21   Bedsole, Amy E, MD  levothyroxine (SYNTHROID) 150 MCG tablet Take 150 mcg by mouth daily. 01/13/22   [provider]  magnesium  hydroxide (MILK OF MAGNESIA) 400 MG/5ML suspension Take 30 mLs by mouth daily as needed for mild constipation.    [provider]  metFORMIN (GLUCOPHAGE) 1000 MG tablet Take 1,000 mg by mouth 2 (two) times daily. 10/28/21   [provider]  Nutritional Supplements (NUTRITIONAL SHAKE PLUS) LIQD Take 1 Container by mouth daily.    [provider]  Omega-3 Fatty Acids (FISH OIL) 1000 MG CAPS Take 1,000 mg by mouth daily.    [provider]  Sodium Phosphates (RA SALINE ENEMA RE) Place 1 Dose rectally daily as needed (constipation).    [provider]    Allergies:   Allergies  Allergen Reactions   Naproxen Sodium Other (See Comments)    Fever/aches and pains   Statins Other (See Comments)    myalgias   Sulfonamide Derivatives Hives and Itching    Not documented on MAR   Latex Itching and Rash   Shellfish Allergy Itching, Swelling and Rash    Seafood, shrimp    Social History:  reports that she has never smoked. She has never used smokeless tobacco. She reports that she does not drink alcohol and does not use drugs.  Family History: Family History  Problem Relation Age of Onset   Lymphoma Father    Hypertension Father    Stroke Father    Hyperlipidemia Father    Hypertension Mother    Allergies Brother    Hypertension Sister        3   Breast cancer Sister    Fibromyalgia Sister        3   Hyperlipidemia Sister        3    Physical Exam: Vitals:   11/22/22 1916 11/22/22 1924 11/22/22 2136  BP: 121/66    Pulse: 91    Resp: 19    Temp: 99.1 F (37.3 C)  100.1 F (37.8 C)  TempSrc:   Rectal  SpO2: 96%    Weight:  59.4 kg   Height:  5\' 6"  (1.676 m)     General:, Awake alert confused female, well developed and nourished, in no distress Eyes: Pink conjunctiva, no scleral icterus ENT: Moist oral mucosa, neck supple, no thyromegaly Lungs: CTA B/L, no wheeze, no crackles Cardiovascular: RRR.  No carotid bruits, no  JVD Abdomen: soft, positive BS, NTND, no organomegaly, not an acute abdomen GU: not examined Neuro: CN II - XII appears grossly intact Musculoskeletal: strength 5/5 all extremities, no edema Skin: no rash, no decubitus Psych: Confused patient   Labs on Admission:  Recent Labs    11/22/22 1951  NA 132*  K 3.9  CL 96*  CO2 25  GLUCOSE 207*  BUN 14  CREATININE 0.89  CALCIUM 9.0   Recent Labs    11/22/22 1951  AST 17  ALT 12  ALKPHOS 75  BILITOT 0.9  PROT 7.2  ALBUMIN 2.9*    Recent Labs  11/22/22 1951  WBC 13.4*  NEUTROABS 10.2*  HGB 10.3*  HCT 31.8*  MCV 90.6  PLT 232     Micro Results: Recent Results (from the past 240 hour(s))  SARS Coronavirus 2 by RT PCR (hospital order, performed in [facility] hospital lab) *cepheid single result test* Anterior Nasal Swab     Status: None   Collection Time: 11/22/22  8:47 PM   Specimen: Anterior Nasal Swab  Result Value Ref Range Status   SARS Coronavirus 2 by RT PCR NEGATIVE NEGATIVE Final    Comment: Performed at [facility], [address], [place], [place] [address]     Radiological Exams on Admission: CT Head Wo Contrast  Result Date: 11/22/2022 CLINICAL DATA:  Mental status change EXAM: CT HEAD WITHOUT CONTRAST TECHNIQUE: Contiguous axial images were obtained from the base of the skull through the vertex without intravenous contrast. RADIATION DOSE REDUCTION: This exam was performed according to the departmental dose-optimization program which includes automated exposure control, adjustment of the mA and/or kV according to patient size and/or use of iterative reconstruction technique. COMPARISON:  CT and MRI head 01/09/2022 FINDINGS: Brain: No intracranial hemorrhage, mass effect, or evidence of acute infarct. No hydrocephalus. No extra-axial fluid collection. Generalized cerebral atrophy and chronic small vessel ischemic disease. Vascular: No hyperdense vessel. Intracranial arterial calcification. Skull:  No fracture or focal lesion. Sinuses/Orbits: Mucosal thickening in the ethmoid air cells in the sphenoid sinuses, increased from 01/09/2022. Other: None. IMPRESSION: No acute intracranial abnormality. Electronically Signed   By: Minerva Fester M.D.   On: 11/22/2022 22:25   DG Chest Portable 1 View  Result Date: 11/22/2022 CLINICAL DATA:  Altered mental status and cough EXAM: PORTABLE CHEST 1 VIEW COMPARISON:  Radiograph 01/24/2022 FINDINGS: The heart size and mediastinal contours are within normal limits. Low lung volumes. Left basilar atelectasis. No focal consolidation, pleural effusion, or pneumothorax. No displaced rib fractures. IMPRESSION: No active disease. Electronically Signed   By: Minerva Fester M.D.   On: 11/22/2022 21:54    Assessment/Plan Present on Admission:  Acute metabolic encephalopathy in patient with dementia -Due to UTI.  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  Sensitive to linezolid.  Linezolid ordered   T2DM -Sliding scale insulin initiated -Lantus ordered at 5 units q. of sleep.  At nursing home patient on 15 units.  Per son patient has not been eating   MDD -Cymbalta resumed   Hypothyroidism -Stable Synthroid resumed   PAF (paroxysmal atrial fibrillation) (HCC) -Eliquis continued.  No rate control medication noted  Ashli Selders 11/22/2022, 11:05 PM

## 2022-11-22 NOTE — ED Provider Triage Note (Signed)
Emergency Medicine Provider Triage Evaluation Note  Nyela Cortinas , a 87 y.o. female  was evaluated in triage.  Patient unattended.  She has no complaints.  Per EMS report, patient with increased weakness and altered mental status.  She has history of UTI.  Review of Systems  Positive:  Negative:   Physical Exam  BP 121/66   Pulse 91   Temp 99.1 F (37.3 C)   Resp 19   Ht 5\' 6"  (1.676 m)   Wt 59.4 kg   SpO2 96%   BMI 21.14 kg/m  Gen:   Awake, no distress  Resp:  Normal effort  MSK:   Moves extremities without difficulty  Other:    Medical Decision Making  Medically screening exam initiated at 7:28 PM.  Appropriate orders placed.  Giada Schoppe was informed that the remainder of the evaluation will be completed by another provider, this initial triage assessment does not replace that evaluation, and the importance of remaining in the ED until their evaluation is complete.  Patient is awake and alert, responsive to questioning.  No focal abdominal pain.  Altered mental status workup ordered.  Will defer head CT at this time until additional history obtained.  No obvious stroke symptoms.   Renne Crigler, PA-C 11/22/22 1929

## 2022-11-22 NOTE — ED Triage Notes (Signed)
Patient BIB EMS for evaluation of altered mental status.  Family reported symptoms started on Tuesday. Concern for possible UTI.  States patient has not been acting like herself.  No reports of fevers.  Pt has no complaints at this time.

## 2022-11-23 DIAGNOSIS — G9341 Metabolic encephalopathy: Secondary | ICD-10-CM | POA: Diagnosis not present

## 2022-11-23 LAB — CBC WITH DIFFERENTIAL/PLATELET
Abs Immature Granulocytes: 0.06 10*3/uL (ref 0.00–0.07)
Basophils Absolute: 0 10*3/uL (ref 0.0–0.1)
Basophils Relative: 0 %
Eosinophils Absolute: 0.1 10*3/uL (ref 0.0–0.5)
Eosinophils Relative: 0 %
HCT: 27.2 % — ABNORMAL LOW (ref 36.0–46.0)
Hemoglobin: 8.9 g/dL — ABNORMAL LOW (ref 12.0–15.0)
Immature Granulocytes: 1 %
Lymphocytes Relative: 9 %
Lymphs Abs: 1.1 10*3/uL (ref 0.7–4.0)
MCH: 30.7 pg (ref 26.0–34.0)
MCHC: 32.7 g/dL (ref 30.0–36.0)
MCV: 93.8 fL (ref 80.0–100.0)
Monocytes Absolute: 1.2 10*3/uL — ABNORMAL HIGH (ref 0.1–1.0)
Monocytes Relative: 11 %
Neutro Abs: 9.1 10*3/uL — ABNORMAL HIGH (ref 1.7–7.7)
Neutrophils Relative %: 79 %
Platelets: 202 10*3/uL (ref 150–400)
RBC: 2.9 MIL/uL — ABNORMAL LOW (ref 3.87–5.11)
RDW: 14.3 % (ref 11.5–15.5)
WBC: 11.6 10*3/uL — ABNORMAL HIGH (ref 4.0–10.5)
nRBC: 0 % (ref 0.0–0.2)

## 2022-11-23 LAB — BASIC METABOLIC PANEL
Anion gap: 11 (ref 5–15)
BUN: 10 mg/dL (ref 8–23)
CO2: 21 mmol/L — ABNORMAL LOW (ref 22–32)
Calcium: 8.1 mg/dL — ABNORMAL LOW (ref 8.9–10.3)
Chloride: 100 mmol/L (ref 98–111)
Creatinine, Ser: 0.77 mg/dL (ref 0.44–1.00)
GFR, Estimated: >60 mL/min (ref >60)
Glucose, Bld: 269 mg/dL — ABNORMAL HIGH (ref 70–99)
Potassium: 3.8 mmol/L (ref 3.5–5.1)
Sodium: 132 mmol/L — ABNORMAL LOW (ref 135–145)

## 2022-11-23 LAB — GLUCOSE, CAPILLARY
Glucose-Capillary: 249 mg/dL — ABNORMAL HIGH (ref 70–99)
Glucose-Capillary: 339 mg/dL — ABNORMAL HIGH (ref 70–99)
Glucose-Capillary: 356 mg/dL — ABNORMAL HIGH (ref 70–99)

## 2022-11-23 LAB — HEMOGLOBIN A1C
Hgb A1c MFr Bld: 8.4 % — ABNORMAL HIGH (ref 4.8–5.6)
Mean Plasma Glucose: 194.38 mg/dL

## 2022-11-23 LAB — MAGNESIUM: Magnesium: 1.5 mg/dL — ABNORMAL LOW (ref 1.7–2.4)

## 2022-11-23 LAB — CBG MONITORING, ED
Glucose-Capillary: 216 mg/dL — ABNORMAL HIGH (ref 70–99)
Glucose-Capillary: 235 mg/dL — ABNORMAL HIGH (ref 70–99)

## 2022-11-23 MED ORDER — LINEZOLID 600 MG/300ML IV SOLN
600.0000 mg | Freq: Two times a day (BID) | INTRAVENOUS | Status: DC
  Administered 2022-11-23 – 2022-11-24 (×4): 600 mg via INTRAVENOUS
  Filled 2022-11-23 (×5): qty 300

## 2022-11-23 NOTE — ED Notes (Signed)
ED TO INPATIENT HANDOFF REPORT  ED Nurse Name and Phone #: Osvaldo Shipper RN [phone]  S Name/Age/Gender Jaclyn [name] 87 y.o. female Room/Bed: 002C/002C  Code Status   Code Status: Full Code  Home/SNF/Other Home Patient oriented to: self, place, time, and situation Is this baseline? Yes   Triage Complete: Triage complete  Chief Complaint Acute metabolic encephalopathy [G93.41]  Triage Note Patient BIB EMS for evaluation of altered mental status.  Family reported symptoms started on Tuesday. Concern for possible UTI.  States patient has not been acting like herself.  No reports of fevers.  Pt has no complaints at this time.     Allergies Allergies  Allergen Reactions   Naproxen Sodium Other (See Comments)    Fever/aches and pains   Statins Other (See Comments)    myalgias   Sulfonamide Derivatives Hives and Itching    Not documented on MAR   Latex Itching and Rash   Shellfish Allergy Itching, Swelling and Rash    Seafood, shrimp    Level of Care/Admitting Diagnosis ED Disposition     ED Disposition  Admit   Condition  --   Comment  Hospital Area: [facility] [[id number]]  Level of Care: Med-Surg [16]  May admit patient to [facility] or [facility] if equivalent level of care is available:: Yes  Covid Evaluation: Asymptomatic - no recent exposure (last 10 days) testing not required  Diagnosis: Acute metabolic encephalopathy [[id number]]  Admitting Physician: Gery Pray [[id number]]  Attending Physician: Gery Pray [[id number]]  Certification:: I certify this patient will need inpatient services for at least 2 midnights  Expected Medical Readiness: 11/24/2022          B Medical/Surgery History Past Medical History:  Diagnosis Date   Atrial fibrillation (HCC)    Benign neoplasm of colon    Carotid artery occlusion    60-79% right ICA stenosis   Difficult intubation 02/17/2006   During surgery to remove large polyp   Diverticulosis of colon  (without mention of hemorrhage)    Dysthymic disorder    Fibromyalgia    Headache(784.0)    Insomnia, unspecified    Interstitial cystitis    Osteoarthrosis, unspecified whether generalized or localized, unspecified site    Other and unspecified hyperlipidemia    Other chest pain    Other specified benign mammary dysplasias    TIA (transient ischemic attack)    Type II or unspecified type diabetes mellitus without mention of complication, not stated as uncontrolled    Unspecified essential hypertension    Unspecified hypothyroidism    Unspecified vitamin D deficiency    Urinary tract infection, site not specified    Past Surgical History:  Procedure Laterality Date   ABDOMINAL HYSTERECTOMY  1988   cervical dysplasia   CARDIAC CATHETERIZATION  02/19/06   EF 60%   COLONOSCOPY     ENDARTERECTOMY Right 08/26/2019   Procedure: RIGHT CAROTID ENDARTERECTOMY;  Surgeon: Larina Earthly, MD;  Location: [facility] OR;  Service: Vascular;  Laterality: Right;   PATCH ANGIOPLASTY Right 08/26/2019   Procedure: PATCH ANGIOPLASTY OF RIGHT COMMON CAROTID ARTERY USING HEMASHIELD PLATINUM FINESSE PATCH;  Surgeon: Larina Earthly, MD;  Location: [facility] OR;  Service: Vascular;  Laterality: Right;   RIGHT COLECTOMY  2005   for villous adenoma of the cecum Dr.streck   VESICOVAGINAL FISTULA CLOSURE W/ TAH  1990   w/ cystocele &retocele repairs Dr. Carman Ching IV Location/Drains/Wounds Patient Lines/Drains/Airways Status     Active  Line/Drains/Airways     Name Placement date Placement time Site Days   Peripheral IV 11/22/22 20 G Distal;Posterior;Right Forearm 11/22/22  2024  Forearm  1   Peripheral IV 11/22/22 20 G Anterior;Left Forearm 11/22/22  2029  Forearm  1            Intake/Output Last 24 hours  Intake/Output Summary (Last 24 hours) at 11/23/2022 0820 Last data filed at 11/23/2022 0310 Gross per 24 hour  Intake 1600 ml  Output --  Net 1600 ml    Labs/Imaging Results for orders placed or performed  during the hospital encounter of 11/22/22 (from the past 48 hour(s))  Urinalysis, w/ Reflex to Culture (Infection Suspected) -Urine, Clean Catch     Status: Abnormal   Collection Time: 11/22/22  7:29 PM  Result Value Ref Range   Specimen Source URINE, CLEAN CATCH    Color, Urine YELLOW YELLOW   APPearance CLEAR CLEAR   Specific Gravity, Urine >1.030 (H) 1.005 - 1.030   pH 6.0 5.0 - 8.0   Glucose, UA NEGATIVE NEGATIVE mg/dL   Hgb urine dipstick SMALL (A) NEGATIVE   Bilirubin Urine NEGATIVE NEGATIVE   Ketones, ur NEGATIVE NEGATIVE mg/dL   Protein, ur 30 (A) NEGATIVE mg/dL   Nitrite POSITIVE (A) NEGATIVE   Leukocytes,Ua TRACE (A) NEGATIVE   Squamous Epithelial / HPF 0-5 0 - 5 /HPF   WBC, UA 6-10 0 - 5 WBC/hpf    Comment: Reflex urine culture not performed if WBC <=10, OR if Squamous epithelial cells >5. If Squamous epithelial cells >5, suggest recollection.   RBC / HPF 0-5 0 - 5 RBC/hpf   Bacteria, UA MANY (A) NONE SEEN    Comment: Performed at [facility], [address] [place], [place] [address]  CBG monitoring, ED     Status: Abnormal   Collection Time: 11/22/22  7:50 PM  Result Value Ref Range   Glucose-Capillary 180 (H) 70 - 99 mg/dL    Comment: Glucose reference range applies only to samples taken after fasting for at least 8 hours.  Comprehensive metabolic panel     Status: Abnormal   Collection Time: 11/22/22  7:51 PM  Result Value Ref Range   Sodium 132 (L) 135 - 145 mmol/L   Potassium 3.9 3.5 - 5.1 mmol/L   Chloride 96 (L) 98 - 111 mmol/L   CO2 25 22 - 32 mmol/L   Glucose, Bld 207 (H) 70 - 99 mg/dL    Comment: Glucose reference range applies only to samples taken after fasting for at least 8 hours.   BUN 14 8 - 23 mg/dL   Creatinine, Ser 8.29 0.44 - 1.00 mg/dL   Calcium 9.0 8.9 - 56.2 mg/dL   Total Protein 7.2 6.5 - 8.1 g/dL   Albumin 2.9 (L) 3.5 - 5.0 g/dL   AST 17 15 - 41 U/L   ALT 12 0 - 44 U/L   Alkaline Phosphatase 75 38 - 126 U/L   Total Bilirubin 0.9  0.3 - 1.2 mg/dL   GFR, Estimated >13 >08 mL/min    Comment: (NOTE) Calculated using the CKD-EPI Creatinine Equation (2021)    Anion gap 11 5 - 15    Comment: Performed at [facility], [address], [place], [place] [address]  CBC with Differential/Platelet     Status: Abnormal   Collection Time: 11/22/22  7:51 PM  Result Value Ref Range   WBC 13.4 (H) 4.0 - 10.5 K/uL   RBC  3.51 (L) 3.87 - 5.11 MIL/uL   Hemoglobin 10.3 (L) 12.0 - 15.0 g/dL   HCT 45.4 (L) 09.8 - 11.9 %   MCV 90.6 80.0 - 100.0 fL   MCH 29.3 26.0 - 34.0 pg   MCHC 32.4 30.0 - 36.0 g/dL   RDW 14.7 82.9 - 56.2 %   Platelets 232 150 - 400 K/uL   nRBC 0.0 0.0 - 0.2 %   Neutrophils Relative % 77 %   Neutro Abs 10.2 (H) 1.7 - 7.7 K/uL   Lymphocytes Relative 10 %   Lymphs Abs 1.4 0.7 - 4.0 K/uL   Monocytes Relative 12 %   Monocytes Absolute 1.7 (H) 0.1 - 1.0 K/uL   Eosinophils Relative 0 %   Eosinophils Absolute 0.0 0.0 - 0.5 K/uL   Basophils Relative 0 %   Basophils Absolute 0.0 0.0 - 0.1 K/uL   Immature Granulocytes 1 %   Abs Immature Granulocytes 0.08 (H) 0.00 - 0.07 K/uL    Comment: Performed at [facility], [address], [place], [place] [address]  I-Stat Lactic Acid, ED     Status: Abnormal   Collection Time: 11/22/22  8:00 PM  Result Value Ref Range   Lactic Acid, Venous 2.7 (HH) 0.5 - 1.9 mmol/L   Comment NOTIFIED PHYSICIAN   Culture, blood (routine x 2)     Status: None (Preliminary result)   Collection Time: 11/22/22  8:25 PM   Specimen: BLOOD RIGHT FOREARM  Result Value Ref Range   Specimen Description BLOOD RIGHT FOREARM    Special Requests      BOTTLES DRAWN AEROBIC AND ANAEROBIC Blood Culture results may not be optimal due to an excessive volume of blood received in culture bottles   Culture      NO GROWTH < 12 HOURS Performed at [facility], [address] [place], [place] [address]    Report Status PENDING   Culture, blood (routine x 2)     Status: None (Preliminary  result)   Collection Time: 11/22/22  8:30 PM   Specimen: BLOOD LEFT FOREARM  Result Value Ref Range   Specimen Description BLOOD LEFT FOREARM    Special Requests      BOTTLES DRAWN AEROBIC AND ANAEROBIC Blood Culture results may not be optimal due to an excessive volume of blood received in culture bottles   Culture      NO GROWTH < 12 HOURS Performed at [facility], [address] [place], [place] [address]    Report Status PENDING   SARS Coronavirus 2 by RT PCR (hospital order, performed in [facility] hospital lab) *cepheid single result test* Anterior Nasal Swab     Status: None   Collection Time: 11/22/22  8:47 PM   Specimen: Anterior Nasal Swab  Result Value Ref Range   SARS Coronavirus 2 by RT PCR NEGATIVE NEGATIVE    Comment: Performed at [facility], [address], [place], [place] [address]  CBG monitoring, ED     Status: Abnormal   Collection Time: 11/23/22 12:32 AM  Result Value Ref Range   Glucose-Capillary 235 (H) 70 - 99 mg/dL    Comment: Glucose reference range applies only to samples taken after fasting for at least 8 hours.  Hemoglobin A1c     Status: Abnormal   Collection Time: 11/23/22  2:09 AM  Result Value Ref Range   Hgb A1c MFr Bld 8.4 (H) 4.8 - 5.6 %    Comment: (  NOTE) Pre diabetes:          5.7%-6.4%  Diabetes:              >6.4%  Glycemic control for   <7.0% adults with diabetes    Mean Plasma Glucose 194.38 mg/dL    Comment: Performed at [facility], [address], [place], [place] [address]  Basic metabolic panel     Status: Abnormal   Collection Time: 11/23/22  2:09 AM  Result Value Ref Range   Sodium 132 (L) 135 - 145 mmol/L   Potassium 3.8 3.5 - 5.1 mmol/L   Chloride 100 98 - 111 mmol/L   CO2 21 (L) 22 - 32 mmol/L   Glucose, Bld 269 (H) 70 - 99 mg/dL    Comment: Glucose reference range applies only to samples taken after fasting for at least 8 hours.   BUN 10 8 - 23 mg/dL   Creatinine, Ser 6.04 0.44 - 1.00 mg/dL    Calcium 8.1 (L) 8.9 - 10.3 mg/dL   GFR, Estimated >54 >09 mL/min    Comment: (NOTE) Calculated using the CKD-EPI Creatinine Equation (2021)    Anion gap 11 5 - 15    Comment: Performed at [facility], [address], [place], [place] [address]  CBC with Differential/Platelet     Status: Abnormal   Collection Time: 11/23/22  2:09 AM  Result Value Ref Range   WBC 11.6 (H) 4.0 - 10.5 K/uL   RBC 2.90 (L) 3.87 - 5.11 MIL/uL   Hemoglobin 8.9 (L) 12.0 - 15.0 g/dL   HCT 47.8 (L) 29.5 - 62.1 %   MCV 93.8 80.0 - 100.0 fL   MCH 30.7 26.0 - 34.0 pg   MCHC 32.7 30.0 - 36.0 g/dL   RDW 30.8 65.7 - 84.6 %   Platelets 202 150 - 400 K/uL   nRBC 0.0 0.0 - 0.2 %   Neutrophils Relative % 79 %   Neutro Abs 9.1 (H) 1.7 - 7.7 K/uL   Lymphocytes Relative 9 %   Lymphs Abs 1.1 0.7 - 4.0 K/uL   Monocytes Relative 11 %   Monocytes Absolute 1.2 (H) 0.1 - 1.0 K/uL   Eosinophils Relative 0 %   Eosinophils Absolute 0.1 0.0 - 0.5 K/uL   Basophils Relative 0 %   Basophils Absolute 0.0 0.0 - 0.1 K/uL   Immature Granulocytes 1 %   Abs Immature Granulocytes 0.06 0.00 - 0.07 K/uL    Comment: Performed at [facility], [address] [place], [place] [address]  Magnesium     Status: Abnormal   Collection Time: 11/23/22  2:09 AM  Result Value Ref Range   Magnesium 1.5 (L) 1.7 - 2.4 mg/dL    Comment: Performed at [facility], [address] [place], [place] [address]   CT Head Wo Contrast  Result Date: 11/22/2022 CLINICAL DATA:  Mental status change EXAM: CT HEAD WITHOUT CONTRAST TECHNIQUE: Contiguous axial images were obtained from the base of the skull through the vertex without intravenous contrast. RADIATION DOSE REDUCTION: This exam was performed according to the departmental dose-optimization program which includes automated exposure control, adjustment of the mA and/or kV according to patient size and/or use of iterative reconstruction technique. COMPARISON:  CT and MRI head 01/09/2022  FINDINGS: Brain: No intracranial hemorrhage, mass effect, or evidence of acute infarct. No hydrocephalus. No extra-axial fluid collection. Generalized cerebral atrophy and chronic small vessel ischemic disease. Vascular: No hyperdense vessel. Intracranial  arterial calcification. Skull: No fracture or focal lesion. Sinuses/Orbits: Mucosal thickening in the ethmoid air cells in the sphenoid sinuses, increased from 01/09/2022. Other: None. IMPRESSION: No acute intracranial abnormality. Electronically Signed   By: Minerva Fester M.D.   On: 11/22/2022 22:25   DG Chest Portable 1 View  Result Date: 11/22/2022 CLINICAL DATA:  Altered mental status and cough EXAM: PORTABLE CHEST 1 VIEW COMPARISON:  Radiograph 01/24/2022 FINDINGS: The heart size and mediastinal contours are within normal limits. Low lung volumes. Left basilar atelectasis. No focal consolidation, pleural effusion, or pneumothorax. No displaced rib fractures. IMPRESSION: No active disease. Electronically Signed   By: Minerva Fester M.D.   On: 11/22/2022 21:54    Pending Labs Unresulted Labs (From admission, onward)     Start     Ordered   11/22/22 1929  Urine Culture  Once,   R        11/22/22 1929            Vitals/Pain Today's Vitals   11/23/22 0600 11/23/22 0630 11/23/22 0646 11/23/22 0700  BP: (!) 117/58 (!) 113/49  (!) 115/58  Pulse: 88 88  89  Resp: (!) 21 (!) 23  (!) 22  Temp:   98.4 F (36.9 C)   TempSrc:   Oral   SpO2: 94% 95%  92%  Weight:      Height:      PainSc:        Isolation Precautions No active isolations  Medications Medications  DULoxetine (CYMBALTA) DR capsule 30 mg (has no administration in time range)  insulin glargine-yfgn (SEMGLEE) injection 5 Units (has no administration in time range)  levothyroxine (SYNTHROID) tablet 75 mcg (75 mcg Oral Given 11/23/22 0647)  apixaban (ELIQUIS) tablet 2.5 mg (2.5 mg Oral Given 11/23/22 0100)  bisacodyl (DULCOLAX) suppository 10 mg (has no administration in  time range)  insulin aspart (novoLOG) injection 0-15 Units (has no administration in time range)  insulin aspart (novoLOG) injection 0-5 Units (2 Units Subcutaneous Given 11/23/22 0101)  cefTRIAXone (ROCEPHIN) 1 g in sodium chloride 0.9 % 100 mL IVPB (has no administration in time range)  acetaminophen (TYLENOL) tablet 650 mg (has no administration in time range)    Or  acetaminophen (TYLENOL) suppository 650 mg (has no administration in time range)  polyethylene glycol (MIRALAX / GLYCOLAX) packet 17 g (has no administration in time range)  ondansetron (ZOFRAN) tablet 4 mg (has no administration in time range)    Or  ondansetron (ZOFRAN) injection 4 mg (has no administration in time range)  0.9 %  sodium chloride infusion ( Intravenous New Bag/Given 11/23/22 0058)  guaiFENesin (ROBITUSSIN) 100 MG/5ML liquid 5 mL (has no administration in time range)  linezolid (ZYVOX) IVPB 600 mg (0 mg Intravenous Stopped 11/23/22 0251)  sodium chloride 0.9 % bolus 1,000 mL (0 mLs Intravenous Stopped 11/22/22 2316)  cefTRIAXone (ROCEPHIN) 1 g in sodium chloride 0.9 % 100 mL IVPB (0 g Intravenous Stopped 11/23/22 0017)  sodium chloride 0.9 % bolus 500 mL (0 mLs Intravenous Stopped 11/23/22 0310)    Mobility walks with person assist     Focused Assessments Acute UTI   R Recommendations: See Admitting Provider Note  Report given to:   Additional Notes:

## 2022-11-23 NOTE — Progress Notes (Signed)
PROGRESS NOTE    Jaclyn [name]  LKG:[id number] DOB: [birth year] DOA: 11/22/2022 PCP: Excell Seltzer, MD  Outpatient Specialists:     Brief Narrative:  Patient is an 87 year old female past medical history significant for paroxysmal atrial fibrillation on Eliquis, type 2 diabetes mellitus, hypothyroidism, interstitial cystitis on chronic suppressive antibiotic therapy, dementia and hard of hearing.  [facility].  She was admitted with confusion.  There are concerns for possible UTI.  Lactic acid on presentation was 2.7, with WBC of 13.4 and UA that was suggestive of UTI.  UA revealed positive nitrite and many bacteria with urine specific gravity of greater than 1.030.  Chest x-ray was clear.  Patient is currently on IV Rocephin and Zyvox.   Lab work reviewed on 11/23/2022: Uncontrolled blood sugar (greater than 300).  Chemistry reveals sodium of 132, potassium of 3.8, chloride of 100, CO2 of 21, BUN of 10, serum creatinine of 0.77 with blood sugar of 269.  Magnesium was 1.5.  GFR is greater than 60 mL/min per 1.73 m.  CBC revealed WBC of 11.6, hemoglobin of 8.9, hematocrit of 27.8, MCV of 93.8 with platelets of 202.  Urine and blood cultures are still pending.   Assessment & Plan:   Active Problems:   Hypothyroidism   Hyperlipidemia LDL goal <70   Essential hypertension, benign   PAF (paroxysmal atrial fibrillation) (HCC)   History of CVA (cerebrovascular accident)   Acute lower UTI   Dementia (HCC)   Dyslipidemia   Failure to thrive in adult   Acute metabolic encephalopathy    Acute metabolic encephalopathy in patient with dementia: -Likely multifactorial. -Patient is volume depleted. -Likely UTI.  Cultures are pending. -Blood sugar has been uncontrolled. -Continue IV fluids. -Continue to manage expectantly.      Acute cystitis// history of VRE -Urine and blood cultures collected, results are pending. -Patient is currently on IV  Rocephin and Zyvox. -Follow cultures. -Further management will depend on above.    T2DM -Uncontrolled. -Continue sliding scale insulin  -Monitor closely.    MDD -Cymbalta resumed    Hypothyroidism -Stable Synthroid resumed    PAF (paroxysmal atrial fibrillation) (HCC) -Eliquis continued.  No rate control medication noted  Hyponatremia: -Mild. -Likely volume related. -Renal panel in the morning. -Check urine sodium.  Volume depletion: -Continue IV fluids.   DVT prophylaxis: Eliquis. Code Status: Full code. Family Communication:  Disposition Plan:    Consultants:  None.  Procedures:  None.  Antimicrobials:  IV Rocephin. IV Zyvox   Subjective: No significant history from patient.  Objective: Vitals:   11/23/22 0646 11/23/22 0700 11/23/22 1224 11/23/22 1616  BP:  (!) 115/58 (!) 126/55 132/63  Pulse:  89 89 93  Resp:  (!) 22 17 19   Temp: 98.4 F (36.9 C)  98.8 F (37.1 C) 99.1 F (37.3 C)  TempSrc: Oral     SpO2:  92% 93% 95%  Weight:      Height:        Intake/Output Summary (Last 24 hours) at 11/23/2022 1743 Last data filed at 11/23/2022 0310 Gross per 24 hour  Intake 1600 ml  Output --  Net 1600 ml   Filed Weights   11/22/22 1924  Weight: 59.4 kg    Examination:  General exam: Appears calm and comfortable.  Patient is resting quietly. Respiratory system: Clear to auscultation.  Cardiovascular system: S1 & S2 heard Gastrointestinal system: Abdomen is soft and nontender.   Central nervous system: Patient  is resting quietly.  Follows command.   Extremities: No significant edema noted.  Data Reviewed: I have personally reviewed following labs and imaging studies  CBC: Recent Labs  Lab 11/22/22 1951 11/23/22 0209  WBC 13.4* 11.6*  NEUTROABS 10.2* 9.1*  HGB 10.3* 8.9*  HCT 31.8* 27.2*  MCV 90.6 93.8  PLT 232 202   Basic Metabolic Panel: Recent Labs  Lab 11/22/22 1951 11/23/22 0209  NA 132* 132*  K 3.9 3.8  CL 96* 100   CO2 25 21*  GLUCOSE 207* 269*  BUN 14 10  CREATININE 0.89 0.77  CALCIUM 9.0 8.1*  MG  --  1.5*   GFR: Estimated Creatinine Clearance: 48.1 mL/min (by C-G formula based on SCr of 0.77 mg/dL). Liver Function Tests: Recent Labs  Lab 11/22/22 1951  AST 17  ALT 12  ALKPHOS 75  BILITOT 0.9  PROT 7.2  ALBUMIN 2.9*   No results for input(s): "LIPASE", "AMYLASE" in the last 168 hours. No results for input(s): "AMMONIA" in the last 168 hours. Coagulation Profile: No results for input(s): "INR", "PROTIME" in the last 168 hours. Cardiac Enzymes: No results for input(s): "CKTOTAL", "CKMB", "CKMBINDEX", "TROPONINI" in the last 168 hours. BNP (last 3 results) No results for input(s): "PROBNP" in the last 8760 hours. HbA1C: Recent Labs    11/23/22 0209  HGBA1C 8.4*   CBG: Recent Labs  Lab 11/22/22 1950 11/23/22 0032 11/23/22 0830 11/23/22 1330 11/23/22 1618  GLUCAP 180* 235* 216* 339* 356*   Lipid Profile: No results for input(s): "CHOL", "HDL", "LDLCALC", "TRIG", "CHOLHDL", "LDLDIRECT" in the last 72 hours. Thyroid Function Tests: No results for input(s): "TSH", "T4TOTAL", "FREET4", "T3FREE", "THYROIDAB" in the last 72 hours. Anemia Panel: No results for input(s): "VITAMINB12", "FOLATE", "FERRITIN", "TIBC", "IRON", "RETICCTPCT" in the last 72 hours. Urine analysis:    Component Value Date/Time   COLORURINE YELLOW 11/22/2022 1929   APPEARANCEUR CLEAR 11/22/2022 1929   LABSPEC >1.030 (H) 11/22/2022 1929   PHURINE 6.0 11/22/2022 1929   GLUCOSEU NEGATIVE 11/22/2022 1929   GLUCOSEU >=1000 (A) 03/16/2019 0915   HGBUR SMALL (A) 11/22/2022 1929   BILIRUBINUR NEGATIVE 11/22/2022 1929   BILIRUBINUR Negative 12/25/2020 1243   KETONESUR NEGATIVE 11/22/2022 1929   PROTEINUR 30 (A) 11/22/2022 1929   UROBILINOGEN 0.2 12/25/2020 1243   UROBILINOGEN 0.2 03/16/2019 0915   NITRITE POSITIVE (A) 11/22/2022 1929   LEUKOCYTESUR TRACE (A) 11/22/2022 1929   Sepsis  Labs: @LABRCNTIP (procalcitonin:4,lacticidven:4)  ) Recent Results (from the past 240 hour(s))  Culture, blood (routine x 2)     Status: None (Preliminary result)   Collection Time: 11/22/22  8:25 PM   Specimen: BLOOD RIGHT FOREARM  Result Value Ref Range Status   Specimen Description BLOOD RIGHT FOREARM  Final   Special Requests   Final    BOTTLES DRAWN AEROBIC AND ANAEROBIC Blood Culture results may not be optimal due to an excessive volume of blood received in culture bottles   Culture   Final    NO GROWTH < 12 HOURS Performed at [facility], [address], [place], [place] [address]    Report Status PENDING  Incomplete  Culture, blood (routine x 2)     Status: None (Preliminary result)   Collection Time: 11/22/22  8:30 PM   Specimen: BLOOD LEFT FOREARM  Result Value Ref Range Status   Specimen Description BLOOD LEFT FOREARM  Final   Special Requests   Final    BOTTLES DRAWN AEROBIC AND ANAEROBIC Blood Culture results may  not be optimal due to an excessive volume of blood received in culture bottles   Culture   Final    NO GROWTH < 12 HOURS Performed at [facility], [address] [place], [place] [address]    Report Status PENDING  Incomplete  SARS Coronavirus 2 by RT PCR (hospital order, performed in [facility] hospital lab) *cepheid single result test* Anterior Nasal Swab     Status: None   Collection Time: 11/22/22  8:47 PM   Specimen: Anterior Nasal Swab  Result Value Ref Range Status   SARS Coronavirus 2 by RT PCR NEGATIVE NEGATIVE Final    Comment: Performed at [facility], [address] [place], [place] [address]         Radiology Studies: CT Head Wo Contrast  Result Date: 11/22/2022 CLINICAL DATA:  Mental status change EXAM: CT HEAD WITHOUT CONTRAST TECHNIQUE: Contiguous axial images were obtained from the base of the skull through the vertex without intravenous contrast. RADIATION DOSE REDUCTION: This exam was performed according to  the departmental dose-optimization program which includes automated exposure control, adjustment of the mA and/or kV according to patient size and/or use of iterative reconstruction technique. COMPARISON:  CT and MRI head 01/09/2022 FINDINGS: Brain: No intracranial hemorrhage, mass effect, or evidence of acute infarct. No hydrocephalus. No extra-axial fluid collection. Generalized cerebral atrophy and chronic small vessel ischemic disease. Vascular: No hyperdense vessel. Intracranial arterial calcification. Skull: No fracture or focal lesion. Sinuses/Orbits: Mucosal thickening in the ethmoid air cells in the sphenoid sinuses, increased from 01/09/2022. Other: None. IMPRESSION: No acute intracranial abnormality. Electronically Signed   By: Minerva Fester M.D.   On: 11/22/2022 22:25   DG Chest Portable 1 View  Result Date: 11/22/2022 CLINICAL DATA:  Altered mental status and cough EXAM: PORTABLE CHEST 1 VIEW COMPARISON:  Radiograph 01/24/2022 FINDINGS: The heart size and mediastinal contours are within normal limits. Low lung volumes. Left basilar atelectasis. No focal consolidation, pleural effusion, or pneumothorax. No displaced rib fractures. IMPRESSION: No active disease. Electronically Signed   By: Minerva Fester M.D.   On: 11/22/2022 21:54        Scheduled Meds:  apixaban  2.5 mg Oral BID   DULoxetine  30 mg Oral Daily   insulin aspart  0-15 Units Subcutaneous TID WC   insulin aspart  0-5 Units Subcutaneous QHS   insulin glargine-yfgn  5 Units Subcutaneous Daily   levothyroxine  75 mcg Oral Daily   Continuous Infusions:  sodium chloride 75 mL/hr at 11/23/22 1727   cefTRIAXone (ROCEPHIN)  IV     linezolid (ZYVOX) IV Stopped (11/23/22 1100)     LOS: 1 day    Time spent: 35 minutes.    Berton Mount, MD  [facility] Pager #: [phone] 7PM-7AM contact night coverage as above

## 2022-11-24 DIAGNOSIS — G9341 Metabolic encephalopathy: Secondary | ICD-10-CM | POA: Diagnosis not present

## 2022-11-24 LAB — RENAL FUNCTION PANEL
Albumin: 2.3 g/dL — ABNORMAL LOW (ref 3.5–5.0)
Anion gap: 9 (ref 5–15)
BUN: <5 mg/dL — ABNORMAL LOW (ref 8–23)
CO2: 20 mmol/L — ABNORMAL LOW (ref 22–32)
Calcium: 7.9 mg/dL — ABNORMAL LOW (ref 8.9–10.3)
Chloride: 100 mmol/L (ref 98–111)
Creatinine, Ser: 0.75 mg/dL (ref 0.44–1.00)
GFR, Estimated: >60 mL/min (ref >60)
Glucose, Bld: 351 mg/dL — ABNORMAL HIGH (ref 70–99)
Phosphorus: 1.6 mg/dL — ABNORMAL LOW (ref 2.5–4.6)
Potassium: 3 mmol/L — ABNORMAL LOW (ref 3.5–5.1)
Sodium: 129 mmol/L — ABNORMAL LOW (ref 135–145)

## 2022-11-24 LAB — MAGNESIUM: Magnesium: 1.4 mg/dL — ABNORMAL LOW (ref 1.7–2.4)

## 2022-11-24 LAB — GLUCOSE, CAPILLARY
Glucose-Capillary: 259 mg/dL — ABNORMAL HIGH (ref 70–99)
Glucose-Capillary: 390 mg/dL — ABNORMAL HIGH (ref 70–99)
Glucose-Capillary: 331 mg/dL — ABNORMAL HIGH (ref 70–99)
Glucose-Capillary: 315 mg/dL — ABNORMAL HIGH (ref 70–99)

## 2022-11-24 LAB — SODIUM, URINE, RANDOM: Sodium, Ur: 42 mmol/L

## 2022-11-24 LAB — AMMONIA: Ammonia: 43 umol/L — ABNORMAL HIGH (ref 9–35)

## 2022-11-24 MED ORDER — POTASSIUM CHLORIDE 10 MEQ/100ML IV SOLN
10.0000 meq | INTRAVENOUS | Status: AC
  Administered 2022-11-24 – 2022-11-25 (×2): 10 meq via INTRAVENOUS
  Filled 2022-11-24 (×2): qty 100

## 2022-11-24 MED ORDER — SENNOSIDES-DOCUSATE SODIUM 8.6-50 MG PO TABS: 1.0000 | ORAL_TABLET | Freq: Every evening | ORAL | Status: DC | PRN

## 2022-11-24 MED ORDER — POTASSIUM PHOSPHATES 15 MMOLE/5ML IV SOLN
30.0000 mmol | Freq: Once | INTRAVENOUS | Status: AC
  Administered 2022-11-24: 30 mmol via INTRAVENOUS
  Filled 2022-11-24: qty 10

## 2022-11-24 MED ORDER — METOPROLOL TARTRATE 5 MG/5ML IV SOLN: 5.0000 mg | INTRAVENOUS | Status: DC | PRN

## 2022-11-24 MED ORDER — HYDRALAZINE HCL 20 MG/ML IJ SOLN: 10.0000 mg | INTRAMUSCULAR | Status: DC | PRN

## 2022-11-24 MED ORDER — TRAZODONE HCL 50 MG PO TABS: 50.0000 mg | ORAL_TABLET | Freq: Every evening | ORAL | Status: DC | PRN

## 2022-11-24 MED ORDER — INSULIN GLARGINE-YFGN 100 UNIT/ML ~~LOC~~ SOLN
10.0000 [IU] | Freq: Every day | SUBCUTANEOUS | Status: DC
  Filled 2022-11-24: qty 0.1

## 2022-11-24 MED ORDER — MAGNESIUM SULFATE 4 GM/100ML IV SOLN
4.0000 g | Freq: Once | INTRAVENOUS | Status: AC
  Administered 2022-11-24: 4 g via INTRAVENOUS
  Filled 2022-11-24: qty 100

## 2022-11-24 MED ORDER — SODIUM CHLORIDE 0.9 % IV SOLN: INTRAVENOUS | Status: DC

## 2022-11-24 MED ORDER — MAGNESIUM SULFATE 2 GM/50ML IV SOLN
2.0000 g | Freq: Once | INTRAVENOUS | Status: AC
  Administered 2022-11-24: 2 g via INTRAVENOUS
  Filled 2022-11-24: qty 50

## 2022-11-24 MED ORDER — LACTULOSE 10 GM/15ML PO SOLN
20.0000 g | Freq: Two times a day (BID) | ORAL | Status: DC
  Administered 2022-11-24 – 2022-11-25 (×2): 20 g via ORAL
  Filled 2022-11-24 (×4): qty 30

## 2022-11-24 MED ORDER — LINEZOLID 600 MG PO TABS
600.0000 mg | ORAL_TABLET | Freq: Two times a day (BID) | ORAL | Status: DC
  Administered 2022-11-24: 600 mg via ORAL
  Filled 2022-11-24: qty 1

## 2022-11-24 MED ORDER — POTASSIUM CHLORIDE 10 MEQ/100ML IV SOLN
10.0000 meq | INTRAVENOUS | Status: AC
  Administered 2022-11-24 (×3): 10 meq via INTRAVENOUS
  Filled 2022-11-24 (×4): qty 100

## 2022-11-24 MED ORDER — INSULIN ASPART 100 UNIT/ML IJ SOLN
3.0000 [IU] | Freq: Three times a day (TID) | INTRAMUSCULAR | Status: DC
  Administered 2022-11-24 (×2): 3 [IU] via SUBCUTANEOUS

## 2022-11-24 MED ORDER — IPRATROPIUM-ALBUTEROL 0.5-2.5 (3) MG/3ML IN SOLN: 3.0000 mL | RESPIRATORY_TRACT | Status: DC | PRN

## 2022-11-24 MED ORDER — INSULIN GLARGINE-YFGN 100 UNIT/ML ~~LOC~~ SOLN
5.0000 [IU] | Freq: Once | SUBCUTANEOUS | Status: AC
  Administered 2022-11-24: 5 [IU] via SUBCUTANEOUS
  Filled 2022-11-24: qty 0.05

## 2022-11-24 NOTE — Hospital Course (Addendum)
  Brief Narrative:  44M with history of paroxysmal A-fib on Eliquis, DM2, hypothyroidism, interstitial cystitis on chronic suppressive antibiotic, dementia, hard of hearing admitted for altered mental status likely secondary to urinary tract infection.  Urine cultures growing Klebsiella/Aerococcus.  Klebsiella is sensitive to Rocephin.  Eventually cultures were pansensitive therefore transition to IV Unasyn.  Eventually she was transitioned to p.o. Augmentin as she was tolerating orals without any issues.   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  Klebsiella sensitive to Unasyn> Augmentin. Last day 10/12 -MRI brain-negative  Elevated ammonia, resolved - Resolved with lactulose.  Dehydration with hypomagnesemia/hypokalemia/hypophosphatemia - IV fluid with repletion.  Very poor oral intake  Diabetes mellitus type 2, uncontrolled secondary to hyperglycemia - Resume home medications.  Blood sugars have remained slightly elevated here in the hospital but because she is on insulin and p.o. medication, son advised to closely monitor blood glucose.  Major depressive disorder -Cymbalta  Hypothyroidism -Synthroid  Paroxysmal atrial fibrillation -Eliquis.  Supportive care.  Patient aggressively needs to use incentive spirometer.  [facility] Speech and swallow evaluation-dysphagia 2 diet  DVT prophylaxis: a Eliquis Code Status: Full code Family Communication: Son at bedside Status is:  Inpatient Discharge today

## 2022-11-24 NOTE — Inpatient Diabetes Management (Signed)
Inpatient Diabetes Program Recommendations  AACE/ADA: New Consensus Statement on Inpatient Glycemic Control  Target Ranges:  Prepandial:   less than 140 mg/dL      Peak postprandial:   less than 180 mg/dL (1-2 hours)      Critically ill patients:  140 - 180 mg/dL    Latest Reference Range & Units 11/23/22 00:32 11/23/22 08:30 11/23/22 13:30 11/23/22 16:18 11/23/22 23:30 11/24/22 08:18  Glucose-Capillary 70 - 99 mg/dL 865 (H) 784 (H) 696 (H) 356 (H) 249 (H) 259 (H)   Review of Glycemic Control  Diabetes history: DM2 Outpatient Diabetes medications: Lantus 18  units at bedtime, Humalog 0-12 units TID, Humalog 3 units TID with meals, Januvia 100 mg daily Current orders for Inpatient glycemic control: Semglee 5 units daily, Novolog 0-15 units TID with meals, Novolog 0-5 units QHS  Inpatient Diabetes Program Recommendations:    Insulin: Please consider increasing Semglee to 10 units daily (if increased, please also order Semglee 5 units x1 now since already given 5 units today) and Novolog 4 units TID with meals for meal coverage if patient eats at least 50% of meals.  Thanks, Orlando Penner, RN, MSN, CDCES Diabetes Coordinator Inpatient Diabetes Program [phone] (Team Pager from 8am to 5pm)

## 2022-11-24 NOTE — Progress Notes (Addendum)
Ok to replace mg with 2g x1 per Dr. Nelson Chimes.  Change linezolid to PO.   Ulyses Southward, PharmD, BCIDP, AAHIVP, [name] Infectious Disease Pharmacist 11/24/2022 9:08 AM

## 2022-11-24 NOTE — Progress Notes (Signed)
OT Cancellation Note  Patient Details Name: Jaclyn [name] MRN: [id number] DOB: 1935/05/12   Cancelled Treatment:    Reason Eval/Treat Not Completed: Fatigue/lethargy limiting ability to participate;Other (comment). Pt' son assisting her to eat and requests OT return later and due to pt's fatigue as well  Galen Manila 11/24/2022, 11:13 AM

## 2022-11-24 NOTE — Progress Notes (Signed)
PROGRESS NOTE    Jaclyn [name]  ZOX:[id number] DOB: 08-Mar-1935 DOA: 11/22/2022 PCP: Excell Seltzer, MD     Brief Narrative:  71M with history of paroxysmal A-fib on Eliquis, DM2, hypothyroidism, interstitial cystitis on chronic suppressive antibiotic, dementia, hard of hearing admitted for altered mental status likely secondary to urinary tract infection.   Assessment & Plan:  Active Problems:   PAF (paroxysmal atrial fibrillation) (HCC)   Hypothyroidism   Dementia (HCC)   Hyperlipidemia LDL goal <70   Essential hypertension, benign   History of CVA (cerebrovascular accident)   Acute lower UTI   Dyslipidemia   Failure to thrive in adult   Acute metabolic encephalopathy   Acute metabolic encephalopathy secondary to urinary tract infection - Previously patient has grown VRE therefore currently on linezolid additionally on IV Rocephin.  Follow culture data.  Encourage oral hydration as much as possible  Dehydration with hypomagnesemia/hypokalemia/hypophosphatemia - IV fluid with repletion.  Very poor oral intake  Diabetes mellitus type 2 -On sliding scale and Accu-Cheks  Major depressive disorder -Cymbalta  Hypothyroidism -Synthroid  Paroxysmal atrial fibrillation -Eliquis.  Supportive care.    DVT prophylaxis: a Eliquis Code Status: Full code Family Communication: Son at bedside Status is: Inpatient Continue hospital stay for metabolic encephalopathy and electrolyte repletion     Subjective: Seen at bedside.  Still confused.  No focal neurodeficit Son present at bedside as well.   Examination:  General exam: Appears calm and comfortable  Respiratory system: Clear to auscultation. Respiratory effort normal. Cardiovascular system: S1 & S2 heard, RRR. No JVD, murmurs, rubs, gallops or clicks. No pedal edema. Gastrointestinal system: Abdomen is nondistended, soft and nontender. No organomegaly or masses felt. Normal bowel sounds heard. Central nervous  system: Alert and oriented. No focal neurological deficits. Extremities: Symmetric 5 x 5 power. Skin: No rashes, lesions or ulcers Psychiatry: Judgement and insight appear poor      Diet Orders (From admission, onward)     Start     Ordered   11/22/22 2329  Diet heart healthy/carb modified Room service appropriate? Yes; Fluid consistency: Thin  Diet effective now       Question Answer Comment  Diet-HS Snack? Nothing   Room service appropriate? Yes   Fluid consistency: Thin      11/22/22 2330            Objective: Vitals:   11/23/22 1616 11/23/22 2336 11/24/22 0219 11/24/22 0817  BP: 132/63 128/61 (!) 126/55 121/61  Pulse: 93 83 78 80  Resp: 19 17 17 18   Temp: 99.1 F (37.3 C) 98.1 F (36.7 C) 98.4 F (36.9 C) 98.5 F (36.9 C)  TempSrc:  Axillary Axillary Oral  SpO2: 95% 100% 98% 94%  Weight:      Height:        Intake/Output Summary (Last 24 hours) at 11/24/2022 1254 Last data filed at 11/24/2022 8119 Gross per 24 hour  Intake 1746.16 ml  Output 200 ml  Net 1546.16 ml   Filed Weights   11/22/22 1924  Weight: 59.4 kg    Scheduled Meds:  apixaban  2.5 mg Oral BID   DULoxetine  30 mg Oral Daily   insulin aspart  0-15 Units Subcutaneous TID WC   insulin aspart  0-5 Units Subcutaneous QHS   insulin glargine-yfgn  5 Units Subcutaneous Daily   levothyroxine  75 mcg Oral Daily   linezolid  600 mg Oral BID   Continuous Infusions:  sodium chloride 75 mL/hr at 11/24/22 0631  sodium chloride     cefTRIAXone (ROCEPHIN)  IV Stopped (11/23/22 2144)   magnesium sulfate bolus IVPB 2 g (11/24/22 1230)   magnesium sulfate bolus IVPB     potassium chloride     potassium PHOSPHATE IVPB (in mmol)      Nutritional status     Body mass index is 21.14 kg/m.  Data Reviewed:   CBC: Recent Labs  Lab 11/22/22 1951 11/23/22 0209  WBC 13.4* 11.6*  NEUTROABS 10.2* 9.1*  HGB 10.3* 8.9*  HCT 31.8* 27.2*  MCV 90.6 93.8  PLT 232 202   Basic Metabolic  Panel: Recent Labs  Lab 11/22/22 1951 11/23/22 0209 11/24/22 1008  NA 132* 132* 129*  K 3.9 3.8 3.0*  CL 96* 100 100  CO2 25 21* 20*  GLUCOSE 207* 269* 351*  BUN 14 10 <5*  CREATININE 0.89 0.77 0.75  CALCIUM 9.0 8.1* 7.9*  MG  --  1.5* 1.4*  PHOS  --   --  1.6*   GFR: Estimated Creatinine Clearance: 48.1 mL/min (by C-G formula based on SCr of 0.75 mg/dL). Liver Function Tests: Recent Labs  Lab 11/22/22 1951 11/24/22 1008  AST 17  --   ALT 12  --   ALKPHOS 75  --   BILITOT 0.9  --   PROT 7.2  --   ALBUMIN 2.9* 2.3*   No results for input(s): "LIPASE", "AMYLASE" in the last 168 hours. No results for input(s): "AMMONIA" in the last 168 hours. Coagulation Profile: No results for input(s): "INR", "PROTIME" in the last 168 hours. Cardiac Enzymes: No results for input(s): "CKTOTAL", "CKMB", "CKMBINDEX", "TROPONINI" in the last 168 hours. BNP (last 3 results) No results for input(s): "PROBNP" in the last 8760 hours. HbA1C: Recent Labs    11/23/22 0209  HGBA1C 8.4*   CBG: Recent Labs  Lab 11/23/22 1330 11/23/22 1618 11/23/22 2330 11/24/22 0818 11/24/22 1201  GLUCAP 339* 356* 249* 259* 390*   Lipid Profile: No results for input(s): "CHOL", "HDL", "LDLCALC", "TRIG", "CHOLHDL", "LDLDIRECT" in the last 72 hours. Thyroid Function Tests: No results for input(s): "TSH", "T4TOTAL", "FREET4", "T3FREE", "THYROIDAB" in the last 72 hours. Anemia Panel: No results for input(s): "VITAMINB12", "FOLATE", "FERRITIN", "TIBC", "IRON", "RETICCTPCT" in the last 72 hours. Sepsis Labs: Recent Labs  Lab 11/22/22 2000  LATICACIDVEN 2.7*    Recent Results (from the past 240 hour(s))  Urine Culture     Status: Abnormal (Preliminary result)   Collection Time: 11/22/22  7:29 PM   Specimen: Urine, Random  Result Value Ref Range Status   Specimen Description URINE, RANDOM  Final   Special Requests ADDED 0126 11/23/2022  Final   Culture (A)  Final    80,000 COLONIES/mL KLEBSIELLA  PNEUMONIAE >=100,000 COLONIES/mL AEROCOCCUS SPECIES Standardized susceptibility testing for this organism is not available. SUSCEPTIBILITIES TO FOLLOW Performed at [facility], [address] [place], [place] [address]    Report Status PENDING  Incomplete  Culture, blood (routine x 2)     Status: None (Preliminary result)   Collection Time: 11/22/22  8:25 PM   Specimen: BLOOD RIGHT FOREARM  Result Value Ref Range Status   Specimen Description BLOOD RIGHT FOREARM  Final   Special Requests   Final    BOTTLES DRAWN AEROBIC AND ANAEROBIC Blood Culture results may not be optimal due to an excessive volume of blood received in culture bottles   Culture   Final    NO GROWTH 2 DAYS Performed at [facility],  [address] [place], [place] [address]    Report Status PENDING  Incomplete  Culture, blood (routine x 2)     Status: None (Preliminary result)   Collection Time: 11/22/22  8:30 PM   Specimen: BLOOD LEFT FOREARM  Result Value Ref Range Status   Specimen Description BLOOD LEFT FOREARM  Final   Special Requests   Final    BOTTLES DRAWN AEROBIC AND ANAEROBIC Blood Culture results may not be optimal due to an excessive volume of blood received in culture bottles   Culture   Final    NO GROWTH 2 DAYS Performed at [facility], [address], [place], [place] [address]    Report Status PENDING  Incomplete  SARS Coronavirus 2 by RT PCR (hospital order, performed in [facility] hospital lab) *cepheid single result test* Anterior Nasal Swab     Status: None   Collection Time: 11/22/22  8:47 PM   Specimen: Anterior Nasal Swab  Result Value Ref Range Status   SARS Coronavirus 2 by RT PCR NEGATIVE NEGATIVE Final    Comment: Performed at [facility], [address] [place], [place] [address]         Radiology Studies: CT Head Wo Contrast  Result Date: 11/22/2022 CLINICAL DATA:  Mental status change EXAM: CT HEAD WITHOUT CONTRAST TECHNIQUE: Contiguous  axial images were obtained from the base of the skull through the vertex without intravenous contrast. RADIATION DOSE REDUCTION: This exam was performed according to the departmental dose-optimization program which includes automated exposure control, adjustment of the mA and/or kV according to patient size and/or use of iterative reconstruction technique. COMPARISON:  CT and MRI head 01/09/2022 FINDINGS: Brain: No intracranial hemorrhage, mass effect, or evidence of acute infarct. No hydrocephalus. No extra-axial fluid collection. Generalized cerebral atrophy and chronic small vessel ischemic disease. Vascular: No hyperdense vessel. Intracranial arterial calcification. Skull: No fracture or focal lesion. Sinuses/Orbits: Mucosal thickening in the ethmoid air cells in the sphenoid sinuses, increased from 01/09/2022. Other: None. IMPRESSION: No acute intracranial abnormality. Electronically Signed   By: Minerva Fester M.D.   On: 11/22/2022 22:25   DG Chest Portable 1 View  Result Date: 11/22/2022 CLINICAL DATA:  Altered mental status and cough EXAM: PORTABLE CHEST 1 VIEW COMPARISON:  Radiograph 01/24/2022 FINDINGS: The heart size and mediastinal contours are within normal limits. Low lung volumes. Left basilar atelectasis. No focal consolidation, pleural effusion, or pneumothorax. No displaced rib fractures. IMPRESSION: No active disease. Electronically Signed   By: Minerva Fester M.D.   On: 11/22/2022 21:54           LOS: 2 days   Time spent= 35 mins    Miguel Rota, MD [facility]  If 7PM-7AM, please contact night-coverage  11/24/2022, 12:54 PM

## 2022-11-25 DIAGNOSIS — N3000 Acute cystitis without hematuria: Secondary | ICD-10-CM

## 2022-11-25 LAB — BASIC METABOLIC PANEL
Anion gap: 13 (ref 5–15)
BUN: <5 mg/dL — ABNORMAL LOW (ref 8–23)
CO2: 18 mmol/L — ABNORMAL LOW (ref 22–32)
Calcium: 7.7 mg/dL — ABNORMAL LOW (ref 8.9–10.3)
Chloride: 98 mmol/L (ref 98–111)
Creatinine, Ser: 0.78 mg/dL (ref 0.44–1.00)
GFR, Estimated: >60 mL/min (ref >60)
Glucose, Bld: 320 mg/dL — ABNORMAL HIGH (ref 70–99)
Potassium: 3.8 mmol/L (ref 3.5–5.1)
Sodium: 129 mmol/L — ABNORMAL LOW (ref 135–145)

## 2022-11-25 LAB — URINE CULTURE: Culture: 80000 — AB

## 2022-11-25 LAB — GLUCOSE, CAPILLARY
Glucose-Capillary: 289 mg/dL — ABNORMAL HIGH (ref 70–99)
Glucose-Capillary: 244 mg/dL — ABNORMAL HIGH (ref 70–99)
Glucose-Capillary: 177 mg/dL — ABNORMAL HIGH (ref 70–99)
Glucose-Capillary: 244 mg/dL — ABNORMAL HIGH (ref 70–99)

## 2022-11-25 LAB — MAGNESIUM: Magnesium: 2 mg/dL (ref 1.7–2.4)

## 2022-11-25 LAB — CBC
HCT: 26.9 % — ABNORMAL LOW (ref 36.0–46.0)
Hemoglobin: 8.9 g/dL — ABNORMAL LOW (ref 12.0–15.0)
MCH: 29.5 pg (ref 26.0–34.0)
MCHC: 33.1 g/dL (ref 30.0–36.0)
MCV: 89.1 fL (ref 80.0–100.0)
Platelets: 249 10*3/uL (ref 150–400)
RBC: 3.02 MIL/uL — ABNORMAL LOW (ref 3.87–5.11)
RDW: 14.1 % (ref 11.5–15.5)
WBC: 9.5 10*3/uL (ref 4.0–10.5)
nRBC: 0 % (ref 0.0–0.2)

## 2022-11-25 MED ORDER — INSULIN GLARGINE-YFGN 100 UNIT/ML ~~LOC~~ SOLN
15.0000 [IU] | Freq: Every day | SUBCUTANEOUS | Status: DC
  Administered 2022-11-25: 15 [IU] via SUBCUTANEOUS
  Filled 2022-11-25 (×2): qty 0.15

## 2022-11-25 MED ORDER — INSULIN ASPART 100 UNIT/ML IJ SOLN
6.0000 [IU] | Freq: Three times a day (TID) | INTRAMUSCULAR | Status: DC
  Administered 2022-11-25 – 2022-11-28 (×7): 6 [IU] via SUBCUTANEOUS

## 2022-11-25 MED ORDER — SODIUM CHLORIDE 0.9 % IV SOLN
1.5000 g | Freq: Four times a day (QID) | INTRAVENOUS | Status: DC
  Administered 2022-11-25 – 2022-11-28 (×12): 1.5 g via INTRAVENOUS
  Filled 2022-11-25 (×14): qty 4

## 2022-11-25 NOTE — Inpatient Diabetes Management (Signed)
Inpatient Diabetes Program Recommendations  AACE/ADA: New Consensus Statement on Inpatient Glycemic Control   Target Ranges:  Prepandial:   less than 140 mg/dL      Peak postprandial:   less than 180 mg/dL (1-2 hours)      Critically ill patients:  140 - 180 mg/dL    Latest Reference Range & Units 11/24/22 08:18 11/24/22 12:01 11/24/22 17:44 11/24/22 20:59 11/25/22 08:08  Glucose-Capillary 70 - 99 mg/dL 756 (H) 433 (H) 295 (H) 315 (H) 289 (H)   Review of Glycemic Control  Diabetes history: DM2 Outpatient Diabetes medications: Lantus 18  units at bedtime, Humalog 0-12 units TID, Humalog 3 units TID with meals, Januvia 100 mg daily Current orders for Inpatient glycemic control: Semglee 15 units daily, Novolog 0-15 units TID with meals, Novolog 0-5 units at bedtime, Novolog 6 units TID with meals   Inpatient Diabetes Program Recommendations:     Insulin: Please consider increasing Semglee to 20 units daily.  Thanks, Orlando Penner, RN, MSN, CDCES Diabetes Coordinator Inpatient Diabetes Program [phone] (Team Pager from 8am to 5pm)

## 2022-11-25 NOTE — TOC Initial Note (Signed)
Transition of Care [facility]) - Initial/Assessment Note    Patient Details  Name: Jaclyn [name] MRN: [id number] Date of Birth: 05/16/1935  Transition of Care [facility]) CM/SW Contact:    Andretta Ergle A [place], Theresia Majors Phone Number: 11/25/2022, 1:32 PM  Clinical Narrative:                  CSW contacted pt's son, Ines Bloomer to assist with admission assessment as pt is only oriented to person. He stated that pt is from [facility] for long term care and that he is pt's legal guardian.   He said that she has been at facility for about 2 years and would be returning to facility. CSW reached out to [facility] as well and pt can return once medically stable.   TOC will continue to follow.   Expected Discharge Plan: Skilled Nursing Facility Barriers to Discharge: Continued Medical Work up   Patient Goals and CMS Choice            Expected Discharge Plan and Services       Living arrangements for the past 2 months: Skilled Nursing Facility                                      Prior Living Arrangements/Services Living arrangements for the past 2 months: Skilled Nursing Facility Lives with:: Facility Resident          Need for Family Participation in Patient Care: Yes (Comment) Care giver support system in place?: Yes (comment)      Activities of Daily Living   ADL Screening (condition at time of admission) Independently performs ADLs?: No Does the patient have a NEW difficulty with bathing/dressing/toileting/self-feeding that is expected to last >3 days?: No Does the patient have a NEW difficulty with getting in/out of bed, walking, or climbing stairs that is expected to last >3 days?: No Does the patient have a NEW difficulty with communication that is expected to last >3 days?: No Is the patient deaf or have difficulty hearing?: No Does the patient have difficulty seeing, even when wearing glasses/contacts?: No Does the patient have difficulty concentrating, remembering, or making  decisions?: Yes  Permission Sought/Granted                  Emotional Assessment Appearance:: Appears stated age Attitude/Demeanor/Rapport: Unable to Assess Affect (typically observed): Unable to Assess Orientation: : Oriented to Self Alcohol / Substance Use: Not Applicable Psych Involvement: No (comment)  Admission diagnosis:  Acute urinary tract infection [N39.0] Acute metabolic encephalopathy [G93.41] Patient Active Problem List   Diagnosis Date Noted   Acute metabolic encephalopathy 11/22/2022   Hypomagnesemia 11/15/2021   Depression 11/15/2021   Incontinence 02/17/2021   Ataxia 02/17/2021   UTI (urinary tract infection) 02/16/2021   Failure to thrive in adult 02/16/2021   Mixed stress and urge urinary incontinence 02/05/2021   Yeast infection 12/03/2020   Hypokalemia    Labile blood glucose    Uncontrolled type 2 diabetes mellitus with hyperglycemia (HCC)    Orthostatic hypotension    Elevated blood pressure reading    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   History of recurrent UTI (urinary tract infection)    Dyslipidemia    Orthostasis    Dysarthria    Vagina, candidiasis 03/10/2019   Dementia (HCC) 03/10/2019   Acute respiratory failure with hypoxia (HCC) 01/24/2019   COVID-19 virus infection 01/15/2019  Acute lower UTI 01/15/2019   B12 deficiency 01/22/2018   History of CVA (cerebrovascular accident) 06/20/2017   Stenosis of right carotid artery    Peripheral edema 07/29/2016   Imbalance 04/07/2016   Intertriginous candidiasis 04/26/2015   Hard of hearing 04/12/2015   TIA (transient ischemic attack) 04/10/2015   History of recurrent UTIs 04/10/2015   Dysuria 03/09/2015   Tremor 05/11/2014   Leg weakness, bilateral 05/11/2014   Fatty liver 08/04/2013   History of colonic polyps 10/05/2012   Esophageal reflux 10/05/2012   Carotid stenosis, symptomatic, with infarction (HCC) 10/14/2010   MDD (major depressive disorder), single  episode, moderate (HCC) 01/13/2008   Essential hypertension, benign 01/13/2008   PAF (paroxysmal atrial fibrillation) (HCC) 01/13/2008   Intracranial vascular stenosis 01/13/2008   DIVERTICULOSIS OF COLON 01/13/2008   Fibromyalgia 01/13/2008   CHEST PAIN, ATYPICAL 01/13/2008   Hypothyroidism 04/13/2007   Type 2 diabetes mellitus (HCC) 04/13/2007   Vitamin D deficiency 04/13/2007   Hyperlipidemia LDL goal <70 04/13/2007   DEGENERATIVE JOINT DISEASE 04/13/2007   PCP:  Excell Seltzer, MD Pharmacy:   [facility] - [place], [place] - [address] [place] [address] Phone: [phone] Fax: [phone]     Social Determinants of Health (SDOH) Social History: SDOH Screenings   Food Insecurity: No Food Insecurity (11/23/2022)  Housing: Low Risk  (11/23/2022)  Transportation Needs: No Transportation Needs (11/23/2022)  Utilities: Not At Risk (11/23/2022)  Depression (PHQ2-9): Medium Risk (09/10/2018)  Financial Resource Strain: High Risk (02/06/2021)  Tobacco Use: Low Risk  (11/22/2022)   SDOH Interventions:     Readmission Risk Interventions     No data to display

## 2022-11-25 NOTE — Evaluation (Signed)
Clinical/Bedside Swallow Evaluation Patient Details  Name: Jaclyn [name] MRN: [id number] Date of Birth: 1935/12/02  Today's Date: 11/25/2022 Time: SLP Start Time (ACUTE ONLY): 1343 SLP Stop Time (ACUTE ONLY): 1354 SLP Time Calculation (min) (ACUTE ONLY): 11 min  Past Medical History:  Past Medical History:  Diagnosis Date   Atrial fibrillation (HCC)    Benign neoplasm of colon    Carotid artery occlusion    60-79% right ICA stenosis   Difficult intubation 02/17/2006   During surgery to remove large polyp   Diverticulosis of colon (without mention of hemorrhage)    Dysthymic disorder    Fibromyalgia    Headache(784.0)    Insomnia, unspecified    Interstitial cystitis    Osteoarthrosis, unspecified whether generalized or localized, unspecified site    Other and unspecified hyperlipidemia    Other chest pain    Other specified benign mammary dysplasias    TIA (transient ischemic attack)    Type II or unspecified type diabetes mellitus without mention of complication, not stated as uncontrolled    Unspecified essential hypertension    Unspecified hypothyroidism    Unspecified vitamin D deficiency    Urinary tract infection, site not specified    Past Surgical History:  Past Surgical History:  Procedure Laterality Date   ABDOMINAL HYSTERECTOMY  1988   cervical dysplasia   CARDIAC CATHETERIZATION  02/19/06   EF 60%   COLONOSCOPY     ENDARTERECTOMY Right 08/26/2019   Procedure: RIGHT CAROTID ENDARTERECTOMY;  Surgeon: Larina Earthly, MD;  Location: [facility] OR;  Service: Vascular;  Laterality: Right;   PATCH ANGIOPLASTY Right 08/26/2019   Procedure: PATCH ANGIOPLASTY OF RIGHT COMMON CAROTID ARTERY USING HEMASHIELD PLATINUM FINESSE PATCH;  Surgeon: Larina Earthly, MD;  Location: [facility] OR;  Service: Vascular;  Laterality: Right;   RIGHT COLECTOMY  2005   for villous adenoma of the cecum Dr.streck   VESICOVAGINAL FISTULA CLOSURE W/ TAH  1990   w/ cystocele &retocele repairs Dr. Elana Alm   HPI:   Jaclyn [name] is a 87 yo female presenting to ED 10/5 from [facility] with AMS. Found to be encephalopathic due to UTI. SLP ordered due to PT reports of oral pocketing with regular texture diet. PMH includes paroxysmal A-fib on Eliquis, T2DM, hypothyroidism, interstitial cystitis on chronic suppressive antibiotic therapy, dementia, hard of hearing    Assessment / Plan / Recommendation  Clinical Impression  Pt initially drowsy, although roused to vocal stimuli. Per RN, pt has swallowed pills without any difficulties this date. She did not follow commands to complete an oral motor exam, although note edentulism. Pt with limited participation in PO trials. Pt accepted minimal presentations of thin liquids and purees, requiring cueing to swallow. She did demonstrate signs of oral holding with pureed textures, although SLP was able to confirm complete oral clearance given Max cueing. Suspect her presentation is likely representative of a cognitively based dysphagia and she may benefit from continued careful hand feeding of downgraded solid textures. Recommend Dys 2 texture solids with thin liquids only when pt is fully awake and alert. Pt will likely benefit from full supervision to assist with feeding and provide cueing. Will continue to follow. SLP Visit Diagnosis: Dysphagia, unspecified (R13.10)    Aspiration Risk  Mild aspiration risk    Diet Recommendation Dysphagia 2 (Fine chop);Thin liquid    Liquid Administration via: Cup;Straw Medication Administration: Whole meds with liquid Supervision: Full supervision/cueing for compensatory strategies;Staff to assist with self feeding Compensations: Slow rate;Small sips/bites Postural  Changes: Seated upright at 90 degrees    Other  Recommendations Oral Care Recommendations: Oral care BID;Staff/trained caregiver to provide oral care    Recommendations for follow up therapy are one component of a multi-disciplinary discharge planning process, led  by the attending physician.  Recommendations may be updated based on patient status, additional functional criteria and insurance authorization.  Follow up Recommendations No SLP follow up      Assistance Recommended at Discharge    Functional Status Assessment Patient has had a recent decline in their functional status and demonstrates the ability to make significant improvements in function in a reasonable and predictable amount of time.  Frequency and Duration min 2x/week  1 week       Prognosis Prognosis for improved oropharyngeal function: Good Barriers to Reach Goals: Cognitive deficits      Swallow Study   General HPI: Jaclyn [name] is a 87 yo female presenting to ED 10/5 from [facility] with AMS. Found to be encephalopathic due to UTI. SLP ordered due to PT reports of oral pocketing with regular texture diet. PMH includes paroxysmal A-fib on Eliquis, T2DM, hypothyroidism, interstitial cystitis on chronic suppressive antibiotic therapy, dementia, hard of hearing Type of Study: Bedside Swallow Evaluation Previous Swallow Assessment: none in chart Diet Prior to this Study: Regular;Thin liquids (Level 0) Temperature Spikes Noted: No Respiratory Status: Room air History of Recent Intubation: No Behavior/Cognition: Alert;Cooperative Oral Cavity Assessment: Within Functional Limits Oral Care Completed by SLP: No Oral Cavity - Dentition: Edentulous Vision: Functional for self-feeding Self-Feeding Abilities: Needs assist Patient Positioning: Upright in bed Baseline Vocal Quality: Low vocal intensity Volitional Cough: Cognitively unable to elicit Volitional Swallow: Unable to elicit    Oral/Motor/Sensory Function Overall Oral Motor/Sensory Function: Within functional limits   Ice Chips Ice chips: Not tested   Thin Liquid Thin Liquid: Within functional limits Presentation: Straw    Nectar Thick Nectar Thick Liquid: Not tested   Honey Thick Honey Thick Liquid: Not tested    Puree Puree: Impaired Presentation: Spoon Oral Phase Functional Implications: Oral holding   Solid     Solid: Not tested      [name], M.A., CF-SLP Speech Language Pathology, Acute Rehabilitation Services  Secure Chat preferred [phone]  11/25/2022,2:27 PM

## 2022-11-25 NOTE — Progress Notes (Signed)
PROGRESS NOTE    Jaclyn [name]  ZOX:[id number] DOB: [birth year] DOA: 11/22/2022 PCP: Excell Seltzer, MD     Brief Narrative:  63M with history of paroxysmal A-fib on Eliquis, DM2, hypothyroidism, interstitial cystitis on chronic suppressive antibiotic, dementia, hard of hearing admitted for altered mental status likely secondary to urinary tract infection.  Urine cultures growing Klebsiella/Aerococcus.  Klebsiella is sensitive to Rocephin.   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  Klebsiella sensitive to Unasyn (eventually plans for Augmentin)  Elevated ammonia - Lactulose  Dehydration with hypomagnesemia/hypokalemia/hypophosphatemia - IV fluid with repletion.  Very poor oral intake  Diabetes mellitus type 2, uncontrolled secondary to hyperglycemia -On sliding scale and Accu-Cheks.  Adjust long-acting and Premeal insulin as needed.  Major depressive disorder -Cymbalta  Hypothyroidism -Synthroid  Paroxysmal atrial fibrillation -Eliquis.  Supportive care.  Patient aggressively needs to use incentive spirometer.  PT/OT-pending  DVT prophylaxis: a Eliquis Code Status: Full code Family Communication: Son at bedside Status is: Inpatient Continue hospital stay for metabolic encephalopathy and electrolyte repletion.  Will need PT/OT            Subjective: Seen at bedside, no complaints.  Son was present at bedside tells me patient had a slightly better day yesterday afternoon but overall still  feels quite weak.  Very poor effort on incentive spirometer.   Examination:  General exam: Appears calm and comfortable  Respiratory system: Bibasilar rhonchi Cardiovascular system: S1 & S2 heard, RRR. No JVD, murmurs, rubs, gallops or clicks. No pedal edema. Gastrointestinal system: Abdomen is nondistended, soft and nontender. No organomegaly or masses felt. Normal bowel sounds heard. Central nervous system: Alert and oriented. No focal neurological deficits. Extremities: Symmetric 4 x 5 power. Skin: No rashes, lesions or ulcers Psychiatry: Judgement and insight appear poor       Diet Orders (From admission, onward)     Start     Ordered   11/22/22 2329  Diet heart healthy/carb modified Room service appropriate? Yes; Fluid consistency: Thin  Diet effective now       Question Answer Comment  Diet-HS Snack? Nothing   Room service appropriate? Yes   Fluid consistency: Thin      11/22/22 2330            Objective: Vitals:   11/24/22 2057 11/25/22 0119 11/25/22 0445 11/25/22 0745  BP: (!) 124/59 (!) 142/61 (!) 130/58 (!) 129/55  Pulse: 81 84 85 88  Resp: 18 18 18 18   Temp: 98.1 F (36.7 C) 99.8 F (37.7 C) 100 F (37.8 C) 98.5 F (36.9 C)  TempSrc: Oral Oral Oral Oral  SpO2: 97% 93% 92% 92%  Weight:      Height:        Intake/Output Summary (Last 24 hours) at 11/25/2022 1135 Last data filed at 11/25/2022 0746 Gross per 24 hour  Intake 150 ml  Output 2050 ml  Net -1900 ml   Filed Weights   11/22/22 1924  Weight: 59.4 kg    Scheduled Meds:  apixaban  2.5 mg Oral BID  DULoxetine  30 mg Oral Daily   insulin aspart  0-15 Units Subcutaneous TID WC   insulin aspart  0-5 Units Subcutaneous QHS   insulin aspart  6 Units Subcutaneous TID WC   insulin glargine-yfgn  15 Units Subcutaneous Daily   lactulose  20 g Oral BID   levothyroxine  75 mcg Oral Daily   Continuous Infusions:  ampicillin-sulbactam (UNASYN) IV 1.5 g (11/25/22 0955)    Nutritional  status     Body mass index is 21.14 kg/m.  Data Reviewed:   CBC: Recent Labs  Lab 11/22/22 1951 11/23/22 0209 11/25/22 0525  WBC 13.4* 11.6* 9.5  NEUTROABS 10.2* 9.1*  --   HGB 10.3* 8.9* 8.9*  HCT 31.8* 27.2* 26.9*  MCV 90.6 93.8 89.1  PLT 232 202 249   Basic Metabolic Panel: Recent Labs  Lab 11/22/22 1951 11/23/22 0209 11/24/22 1008 11/25/22 0525  NA 132* 132* 129* 129*  K 3.9 3.8 3.0* 3.8  CL 96* 100 100 98  CO2 25 21* 20* 18*  GLUCOSE 207* 269* 351* 320*  BUN 14 10 <5* <5*  CREATININE 0.89 0.77 0.75 0.78  CALCIUM 9.0 8.1* 7.9* 7.7*  MG  --  1.5* 1.4* 2.0  PHOS  --   --  1.6*  --    GFR: Estimated Creatinine Clearance: 48.1 mL/min (by C-G formula based on SCr of 0.78 mg/dL). Liver Function Tests: Recent Labs  Lab 11/22/22 1951 11/24/22 1008  AST 17  --   ALT 12  --   ALKPHOS 75  --   BILITOT 0.9  --   PROT 7.2  --   ALBUMIN 2.9* 2.3*   No results for input(s): "LIPASE", "AMYLASE" in the last 168 hours. Recent Labs  Lab 11/24/22 1349  AMMONIA 43*   Coagulation Profile: No results for input(s): "INR", "PROTIME" in the last 168 hours. Cardiac Enzymes: No results for input(s): "CKTOTAL", "CKMB", "CKMBINDEX", "TROPONINI" in the last 168 hours. BNP (last 3 results) No results for input(s): "PROBNP" in the last 8760 hours. HbA1C: Recent Labs    11/23/22 0209  HGBA1C 8.4*   CBG: Recent Labs  Lab 11/24/22 0818 11/24/22 1201 11/24/22 1744 11/24/22 2059 11/25/22 0808  GLUCAP 259* 390* 331* 315* 289*   Lipid Profile: No results for input(s): "CHOL", "HDL", "LDLCALC", "TRIG", "CHOLHDL", "LDLDIRECT" in the last 72 hours. Thyroid Function Tests: No results for input(s): "TSH", "T4TOTAL", "FREET4", "T3FREE", "THYROIDAB" in the last 72 hours. Anemia Panel: No results for input(s): "VITAMINB12", "FOLATE", "FERRITIN", "TIBC", "IRON", "RETICCTPCT" in the last 72 hours. Sepsis Labs: Recent Labs  Lab 11/22/22 2000  LATICACIDVEN 2.7*    Recent  Results (from the past 240 hour(s))  Urine Culture     Status: Abnormal   Collection Time: 11/22/22  7:29 PM   Specimen: Urine, Random  Result Value Ref Range Status   Specimen Description URINE, RANDOM  Final   Special Requests ADDED 0126 11/23/2022  Final   Culture (A)  Final    80,000 COLONIES/mL KLEBSIELLA PNEUMONIAE >=100,000 COLONIES/mL AEROCOCCUS SPECIES Standardized susceptibility testing for this organism is not available. Performed at [facility], [address], [place], [place] [address]    Report Status 11/25/2022 FINAL  Final   Organism ID, Bacteria KLEBSIELLA PNEUMONIAE (A)  Final      Susceptibility   Klebsiella pneumoniae - MIC*    AMPICILLIN RESISTANT Resistant     CEFAZOLIN <=4 SENSITIVE Sensitive     CEFEPIME <=0.12 SENSITIVE Sensitive  CEFTRIAXONE <=0.25 SENSITIVE Sensitive     CIPROFLOXACIN >=4 RESISTANT Resistant     GENTAMICIN <=1 SENSITIVE Sensitive     IMIPENEM <=0.25 SENSITIVE Sensitive     NITROFURANTOIN 64 INTERMEDIATE Intermediate     TRIMETH/SULFA >=320 RESISTANT Resistant     AMPICILLIN/SULBACTAM 4 SENSITIVE Sensitive     PIP/TAZO <=4 SENSITIVE Sensitive ug/mL    * 80,000 COLONIES/mL KLEBSIELLA PNEUMONIAE  Culture, blood (routine x 2)     Status: None (Preliminary result)   Collection Time: 11/22/22  8:25 PM   Specimen: BLOOD RIGHT FOREARM  Result Value Ref Range Status   Specimen Description BLOOD RIGHT FOREARM  Final   Special Requests   Final    BOTTLES DRAWN AEROBIC AND ANAEROBIC Blood Culture results may not be optimal due to an excessive volume of blood received in culture bottles   Culture   Final    NO GROWTH 3 DAYS Performed at [facility], [address], [place], [place] [address]    Report Status PENDING  Incomplete  Culture, blood (routine x 2)     Status: None (Preliminary result)   Collection Time: 11/22/22  8:30 PM   Specimen: BLOOD LEFT FOREARM  Result Value Ref Range Status   Specimen Description BLOOD LEFT  FOREARM  Final   Special Requests   Final    BOTTLES DRAWN AEROBIC AND ANAEROBIC Blood Culture results may not be optimal due to an excessive volume of blood received in culture bottles   Culture   Final    NO GROWTH 3 DAYS Performed at [facility], [address] [place], [place] [address]    Report Status PENDING  Incomplete  SARS Coronavirus 2 by RT PCR (hospital order, performed in [facility] hospital lab) *cepheid single result test* Anterior Nasal Swab     Status: None   Collection Time: 11/22/22  8:47 PM   Specimen: Anterior Nasal Swab  Result Value Ref Range Status   SARS Coronavirus 2 by RT PCR NEGATIVE NEGATIVE Final    Comment: Performed at [facility], [address], [place], [place] [address]         Radiology Studies: No results found.         LOS: 3 days   Time spent= 35 mins    Miguel Rota, MD [facility]  If 7PM-7AM, please contact night-coverage  11/25/2022, 11:35 AM

## 2022-11-25 NOTE — Evaluation (Signed)
Occupational Therapy Evaluation/Discharge Patient Details Name: Jaclyn [name] MRN: [id number] DOB: December 06, 1935 Today's Date: 11/25/2022   History of Present Illness 87 yo female admitted 10/5 from [facility] with AMS, UTI, encephalopathy. PMHx: dementia, Afib, fibromyalgia, HTN, T2DM, hypothyroidism, TIA, orthostatic hypotension   Clinical Impression   PTA, pt is a LTC resident at [facility], typically requires 1-2 person assist for transfers and mostly dependent for ADLs (aside from self feeding and grooming). Pt presents now near baseline with +2 assist needed for bed mobility and standing attempts. Pt able to feed self but some concerns noted w/ pocketing food items during session - RN aware. As pt is at baseline, no further skilled OT services needed at this time. Recommend DC back to SNF and use of mechanical lift for transfers OOB to maximize pt and staff safety.       If plan is discharge home, recommend the following:      Functional Status Assessment  Patient has not had a recent decline in their functional status  Equipment Recommendations  None recommended by OT    Recommendations for Other Services Speech consult     Precautions / Restrictions Precautions Precautions: Fall Restrictions Weight Bearing Restrictions: No      Mobility Bed Mobility Overal bed mobility: Needs Assistance Bed Mobility: Supine to Sit, Sit to Supine     Supine to sit: Max assist, HOB elevated, Used rails Sit to supine: Max assist   General bed mobility comments: max assist with HOB 40 degrees and max multimodal cues for rail use and sequence with pad to pivot to right side of bed. return to bed with max assist to lift legs and pivot to surface. Total +2 to slide up in bed and reposition in semichair in bed. max assist balance EOB with anterior right lean with pt unable to correct.    Transfers Overall transfer level: Needs assistance   Transfers: Sit to/from Stand Sit to Stand:  Max assist, +2 physical assistance           General transfer comment: Son deferred transfer to chair once EOB and stood and then performed lateral scoot up in bed. max +2 assist      Balance Overall balance assessment: Needs assistance Sitting-balance support: Feet supported, Bilateral upper extremity supported Sitting balance-Leahy Scale: Poor Sitting balance - Comments: right lean Postural control: Right lateral lean Standing balance support: Bilateral upper extremity supported Standing balance-Leahy Scale: Poor                             ADL either performed or assessed with clinical judgement   ADL Overall ADL's : Needs assistance/impaired Eating/Feeding: Supervision/ safety;Set up;Bed level Eating/Feeding Details (indicate cue type and reason): son assisting on entry, pt noted to pocket french toast in cheek and chew for extended time. at end of session, pt able to hold cup to drink juice when cued and feed self grits Grooming: Minimal assistance;Bed level   Upper Body Bathing: Maximal assistance;Bed level   Lower Body Bathing: Total assistance;Bed level   Upper Body Dressing : Maximal assistance;Bed level   Lower Body Dressing: Total assistance       Toileting- Clothing Manipulation and Hygiene: Total assistance;Bed level               Vision Ability to See in Adequate Light: 0 Adequate Patient Visual Report: No change from baseline Vision Assessment?: No apparent visual deficits  Perception         Praxis         Pertinent Vitals/Pain Pain Assessment Pain Assessment: No/denies pain     Extremity/Trunk Assessment Upper Extremity Assessment Upper Extremity Assessment: LUE deficits/detail;Right hand dominant LUE Deficits / Details: reports of painful L UE, "entire" UE per son   Lower Extremity Assessment Lower Extremity Assessment: Defer to PT evaluation   Cervical / Trunk Assessment Cervical / Trunk Assessment: Kyphotic    Communication Communication Communication: Hearing impairment   Cognition Arousal: Alert Behavior During Therapy: Flat affect Overall Cognitive Status: Impaired/Different from baseline Area of Impairment: Following commands                       Following Commands: Follows one step commands with increased time, Follows one step commands inconsistently       General Comments: hx of dementia with minor increased confusion than normal per son.     General Comments       Exercises     Shoulder Instructions      Home Living Family/patient expects to be discharged to:: Skilled nursing facility                                 Additional Comments: [facility] resident      Prior Functioning/Environment Prior Level of Function : Needs assist             Mobility Comments: pt performs stand pivot assist with +1 or +2 assist from staff, dependent for wheelchair mobility ADLs Comments: pt is able to feed herself, otherwise requires max assist for other ADLs        OT Problem List: Decreased cognition      OT Treatment/Interventions:      OT Goals(Current goals can be found in the care plan section) Acute Rehab OT Goals Patient Stated Goal: none stated OT Goal Formulation: All assessment and education complete, DC therapy  OT Frequency:      Co-evaluation PT/OT/SLP Co-Evaluation/Treatment: Yes Reason for Co-Treatment: For patient/therapist safety PT goals addressed during session: Mobility/safety with mobility;Balance OT goals addressed during session: ADL's and self-care;Proper use of Adaptive equipment and DME      AM-PAC OT "6 Clicks" Daily Activity     Outcome Measure Help from another person eating meals?: A Little Help from another person taking care of personal grooming?: A Little Help from another person toileting, which includes using toliet, bedpan, or urinal?: Total Help from another person bathing (including washing, rinsing,  drying)?: A Lot Help from another person to put on and taking off regular upper body clothing?: A Lot Help from another person to put on and taking off regular lower body clothing?: Total 6 Click Score: 12   End of Session Nurse Communication: Mobility status;Other (comment)  Activity Tolerance: Patient tolerated treatment well Patient left: in bed;with call bell/phone within reach;with bed alarm set;with family/visitor present  OT Visit Diagnosis: Other symptoms and signs involving cognitive function                Time: 1003-1025 OT Time Calculation (min): 22 min Charges:  OT General Charges $OT Visit: 1 Visit OT Evaluation $OT Eval Moderate Complexity: 1 Mod  Bradd Canary, OTR/L Acute Rehab Services Office: [phone]   Lorre Munroe 11/25/2022, 11:44 AM

## 2022-11-25 NOTE — Discharge Instructions (Signed)

## 2022-11-25 NOTE — Evaluation (Signed)
Physical Therapy Evaluation/ Discharge Patient Details Name: Konnor Vanbergen MRN: [id number] DOB: 1935/08/23 Today's Date: 11/25/2022  History of Present Illness  87 yo female admitted 10/5 from [facility] SNF with AMS, UTI, encephalopathy. PMHx: dementia, Afib, fibromyalgia, HTN, T2DM, hypothyroidism, TIA, orthostatic hypotension  Clinical Impression  PT pleasant, HOH and son present to provide PLOF. PT long term resident at [facility] and at baseline requires assist for all ADLs and only transfers to [facility] with staff assist. Pt fearful of falling and was max +2 to stand and perform lateral scoot at EOB. Pt at baseline functional status without further acute needs, will sign off.         If plan is discharge home, recommend the following: Two people to help with walking and/or transfers;Two people to help with bathing/dressing/bathroom;Assistance with cooking/housework;Assist for transportation;Direct supervision/assist for medications management   Can travel by private vehicle   No    Equipment Recommendations None recommended by PT  Recommendations for Other Services       Functional Status Assessment Patient has not had a recent decline in their functional status     Precautions / Restrictions Precautions Precautions: Fall      Mobility  Bed Mobility Overal bed mobility: Needs Assistance Bed Mobility: Supine to Sit, Sit to Supine     Supine to sit: Max assist, HOB elevated, Used rails Sit to supine: Max assist   General bed mobility comments: max assist with HOB 40 degrees and max multimodal cues for rail use and sequence with pad to pivot to right side of bed. return to bed with max assist to lift legs and pivot to surface. Total +2 to slide up in bed and reposition in semichair in bed. max assist balance EOB with anterior right lean with pt unable to correct.    Transfers Overall transfer level: Needs assistance   Transfers: Sit to/from Stand Sit to Stand: Max assist,  +2 physical assistance           General transfer comment: Son deferred transfer to chair once EOB and stood and then performed lateral scoot up in bed. max +2 assist    Ambulation/Gait               General Gait Details: unable  Stairs            Wheelchair Mobility     Tilt Bed    Modified Rankin (Stroke Patients Only)       Balance Overall balance assessment: Needs assistance Sitting-balance support: Feet supported, Bilateral upper extremity supported Sitting balance-Leahy Scale: Poor Sitting balance - Comments: right lean                                     Pertinent Vitals/Pain Pain Assessment Pain Assessment: No/denies pain    Home Living Family/patient expects to be discharged to:: Skilled nursing facility                   Additional Comments: [facility] resident    Prior Function Prior Level of Function : Needs assist             Mobility Comments: pt performs stand pivot assist with +1 or +2 assist from staff, dependent for wheelchair mobility ADLs Comments: pt is able to feed herself, otherwise requires max assist for other ADLs     Extremity/Trunk Assessment   Upper Extremity Assessment Upper Extremity Assessment: Defer  to OT evaluation    Lower Extremity Assessment Lower Extremity Assessment: Generalized weakness    Cervical / Trunk Assessment Cervical / Trunk Assessment: Kyphotic  Communication   Communication Communication: Hearing impairment  Cognition Arousal: Alert Behavior During Therapy: Flat affect Overall Cognitive Status: Impaired/Different from baseline Area of Impairment: Following commands                       Following Commands: Follows one step commands with increased time, Follows one step commands inconsistently                General Comments      Exercises     Assessment/Plan    PT Assessment Patient does not need any further PT services  PT Problem  List         PT Treatment Interventions      PT Goals (Current goals can be found in the Care Plan section)  Acute Rehab PT Goals PT Goal Formulation: All assessment and education complete, DC therapy    Frequency       Co-evaluation PT/OT/SLP Co-Evaluation/Treatment: Yes Reason for Co-Treatment: For patient/therapist safety PT goals addressed during session: Mobility/safety with mobility;Balance         AM-PAC PT "6 Clicks" Mobility  Outcome Measure Help needed turning from your back to your side while in a flat bed without using bedrails?: A Lot Help needed moving from lying on your back to sitting on the side of a flat bed without using bedrails?: A Lot Help needed moving to and from a bed to a chair (including a wheelchair)?: Total Help needed standing up from a chair using your arms (e.g., wheelchair or bedside chair)?: Total Help needed to walk in hospital room?: Total Help needed climbing 3-5 steps with a railing? : Total 6 Click Score: 8    End of Session   Activity Tolerance: Patient tolerated treatment well;Other (comment) (pt with fear of falling) Patient left: in bed;with call bell/phone within reach;with bed alarm set;with family/visitor present Nurse Communication: Mobility status;Need for lift equipment PT Visit Diagnosis: Other abnormalities of gait and mobility (R26.89)    Time: 1610-9604 PT Time Calculation (min) (ACUTE ONLY): 20 min   Charges:   PT Evaluation $PT Eval Low Complexity: 1 Low   PT General Charges $$ ACUTE PT VISIT: 1 Visit         Merryl Hacker, PT Acute Rehabilitation Services Office: [phone]   Enedina Finner Aydenn Gervin 11/25/2022, 10:38 AM

## 2022-11-26 ENCOUNTER — Inpatient Hospital Stay (HOSPITAL_COMMUNITY): Payer: Medicare Other

## 2022-11-26 DIAGNOSIS — N3001 Acute cystitis with hematuria: Secondary | ICD-10-CM | POA: Diagnosis not present

## 2022-11-26 LAB — CBC
HCT: 26.7 % — ABNORMAL LOW (ref 36.0–46.0)
Hemoglobin: 8.8 g/dL — ABNORMAL LOW (ref 12.0–15.0)
MCH: 29.2 pg (ref 26.0–34.0)
MCHC: 33 g/dL (ref 30.0–36.0)
MCV: 88.7 fL (ref 80.0–100.0)
Platelets: 283 10*3/uL (ref 150–400)
RBC: 3.01 MIL/uL — ABNORMAL LOW (ref 3.87–5.11)
RDW: 14.4 % (ref 11.5–15.5)
WBC: 9.9 10*3/uL (ref 4.0–10.5)
nRBC: 0 % (ref 0.0–0.2)

## 2022-11-26 LAB — GLUCOSE, CAPILLARY
Glucose-Capillary: 229 mg/dL — ABNORMAL HIGH (ref 70–99)
Glucose-Capillary: 245 mg/dL — ABNORMAL HIGH (ref 70–99)
Glucose-Capillary: 210 mg/dL — ABNORMAL HIGH (ref 70–99)
Glucose-Capillary: 224 mg/dL — ABNORMAL HIGH (ref 70–99)

## 2022-11-26 LAB — BASIC METABOLIC PANEL
Anion gap: 10 (ref 5–15)
BUN: <5 mg/dL — ABNORMAL LOW (ref 8–23)
CO2: 22 mmol/L (ref 22–32)
Calcium: 8.4 mg/dL — ABNORMAL LOW (ref 8.9–10.3)
Chloride: 103 mmol/L (ref 98–111)
Creatinine, Ser: 0.68 mg/dL (ref 0.44–1.00)
GFR, Estimated: >60 mL/min (ref >60)
Glucose, Bld: 223 mg/dL — ABNORMAL HIGH (ref 70–99)
Potassium: 3.7 mmol/L (ref 3.5–5.1)
Sodium: 135 mmol/L (ref 135–145)

## 2022-11-26 LAB — MAGNESIUM: Magnesium: 1.6 mg/dL — ABNORMAL LOW (ref 1.7–2.4)

## 2022-11-26 LAB — AMMONIA: Ammonia: 17 umol/L (ref 9–35)

## 2022-11-26 MED ORDER — MAGNESIUM SULFATE 4 GM/100ML IV SOLN
4.0000 g | Freq: Once | INTRAVENOUS | Status: AC
  Administered 2022-11-26: 4 g via INTRAVENOUS
  Filled 2022-11-26: qty 100

## 2022-11-26 MED ORDER — INSULIN GLARGINE-YFGN 100 UNIT/ML ~~LOC~~ SOLN
20.0000 [IU] | Freq: Every day | SUBCUTANEOUS | Status: DC
  Administered 2022-11-26: 20 [IU] via SUBCUTANEOUS
  Filled 2022-11-26 (×2): qty 0.2

## 2022-11-26 MED ORDER — LACTULOSE 10 GM/15ML PO SOLN
20.0000 g | Freq: Three times a day (TID) | ORAL | Status: DC
  Administered 2022-11-26 (×2): 20 g via ORAL
  Filled 2022-11-26 (×4): qty 30

## 2022-11-26 NOTE — Progress Notes (Signed)
Speech Language Pathology Treatment: Dysphagia  Patient Details Name: Jaclyn [name] MRN: [id number] DOB: 1935-03-02 Today's Date: 11/26/2022 Time: 0950-1001 SLP Time Calculation (min) (ACUTE ONLY): 11 min  Assessment / Plan / Recommendation Clinical Impression  Pt continues to present with what is presumed to be a cognitive based dysphagia.  She remains very drowsy today.  Able to rouse with cold, wet cloth applied to face, still kept here eyes closed throughout assessment. Pt tolerated serial straw sips of thin liquid without clinical s/s of aspiration.  Pt did not accept regular solid or soft solid trials.  She expectorated cracker when presented by spoon or offered for pt to bite through.  Pt accepted small amounts of simulated ground consistency.  Oral response observed. It was difficult to visualize oral cavity, but there was no obvious bolus retention and pt exhibited pharyngeal swallow response.  Pt tolerated puree trials as well.  She is unlikely to take much by mouth if she remains somnolent, but she appears safe to do so.  Recommend continuing current diet.  Hopefully, with UTI treatment she will return to baseline level of function.  Son reports pt eats more or less a regular diet, but does report she has bad teeth which impacts mastication.  Recommend continuing ground/chopped diet with thin liquids.    HPI HPI: Jaclyn [name] is a 87 yo female presenting to ED 10/5 from [facility] with AMS. Found to be encephalopathic due to UTI. SLP ordered due to PT reports of oral pocketing with regular texture diet. PMH includes paroxysmal A-fib on Eliquis, T2DM, hypothyroidism, interstitial cystitis on chronic suppressive antibiotic therapy, dementia, hard of hearing      SLP Plan  Continue with current plan of care      Recommendations for follow up therapy are one component of a multi-disciplinary discharge planning process, led by the attending physician.  Recommendations may be  updated based on patient status, additional functional criteria and insurance authorization.    Recommendations  Diet recommendations: Dysphagia 2 (fine chop);Thin liquid Liquids provided via: Straw;Cup Medication Administration: Whole meds with liquid Supervision: Trained caregiver to feed patient Compensations: Slow rate;Small sips/bites Postural Changes and/or Swallow Maneuvers: Seated upright 90 degrees                  Oral care BID;Staff/trained caregiver to provide oral care     Dysphagia, unspecified (R13.10)     Continue with current plan of care     Kerrie Pleasure, MA, CCC-SLP Acute Rehabilitation Services Office: [phone] 11/26/2022, 10:06 AM

## 2022-11-26 NOTE — Plan of Care (Signed)
  Problem: Fluid Volume: Goal: Hemodynamic stability will improve Outcome: Progressing   Problem: Clinical Measurements: Goal: Diagnostic test results will improve Outcome: Progressing Goal: Signs and symptoms of infection will decrease Outcome: Progressing   Problem: Respiratory: Goal: Ability to maintain adequate ventilation will improve Outcome: Progressing   Problem: Education: Goal: Knowledge of disease or condition will improve Outcome: Progressing Goal: Knowledge of secondary prevention will improve (MUST DOCUMENT ALL) Outcome: Progressing Goal: Knowledge of patient specific risk factors will improve Elta [place] N/A or DELETE if not current risk factor) Outcome: Progressing   Problem: Ischemic Stroke/TIA Tissue Perfusion: Goal: Complications of ischemic stroke/TIA will be minimized Outcome: Progressing   Problem: Coping: Goal: Will verbalize positive feelings about self Outcome: Progressing Goal: Will identify appropriate support needs Outcome: Progressing   Problem: Health Behavior/Discharge Planning: Goal: Ability to manage health-related needs will improve Outcome: Progressing Goal: Goals will be collaboratively established with patient/family Outcome: Progressing   Problem: Self-Care: Goal: Ability to participate in self-care as condition permits will improve Outcome: Progressing Goal: Verbalization of feelings and concerns over difficulty with self-care will improve Outcome: Progressing Goal: Ability to communicate needs accurately will improve Outcome: Progressing   Problem: Nutrition: Goal: Risk of aspiration will decrease Outcome: Progressing Goal: Dietary intake will improve Outcome: Progressing   Problem: Education: Goal: Ability to describe self-care measures that may prevent or decrease complications (Diabetes Survival Skills Education) will improve Outcome: Progressing Goal: Individualized Educational Video(s) Outcome: Progressing   Problem:  Coping: Goal: Ability to adjust to condition or change in health will improve Outcome: Progressing   Problem: Fluid Volume: Goal: Ability to maintain a balanced intake and output will improve Outcome: Progressing   Problem: Health Behavior/Discharge Planning: Goal: Ability to identify and utilize available resources and services will improve Outcome: Progressing Goal: Ability to manage health-related needs will improve Outcome: Progressing   Problem: Metabolic: Goal: Ability to maintain appropriate glucose levels will improve Outcome: Progressing   Problem: Nutritional: Goal: Maintenance of adequate nutrition will improve Outcome: Progressing Goal: Progress toward achieving an optimal weight will improve Outcome: Progressing   Problem: Skin Integrity: Goal: Risk for impaired skin integrity will decrease Outcome: Progressing   Problem: Tissue Perfusion: Goal: Adequacy of tissue perfusion will improve Outcome: Progressing   Problem: Education: Goal: Knowledge of General Education information will improve Description: Including pain rating scale, medication(s)/side effects and non-pharmacologic comfort measures Outcome: Progressing   Problem: Health Behavior/Discharge Planning: Goal: Ability to manage health-related needs will improve Outcome: Progressing   Problem: Clinical Measurements: Goal: Ability to maintain clinical measurements within normal limits will improve Outcome: Progressing Goal: Will remain free from infection Outcome: Progressing Goal: Diagnostic test results will improve Outcome: Progressing Goal: Respiratory complications will improve Outcome: Progressing Goal: Cardiovascular complication will be avoided Outcome: Progressing   Problem: Activity: Goal: Risk for activity intolerance will decrease Outcome: Progressing   Problem: Nutrition: Goal: Adequate nutrition will be maintained Outcome: Progressing   Problem: Coping: Goal: Level of  anxiety will decrease Outcome: Progressing   Problem: Elimination: Goal: Will not experience complications related to bowel motility Outcome: Progressing Goal: Will not experience complications related to urinary retention Outcome: Progressing   Problem: Pain Managment: Goal: General experience of comfort will improve Outcome: Progressing   Problem: Safety: Goal: Ability to remain free from injury will improve Outcome: Progressing   Problem: Skin Integrity: Goal: Risk for impaired skin integrity will decrease Outcome: Progressing

## 2022-11-26 NOTE — Progress Notes (Signed)
PROGRESS NOTE    Jaclyn [name]  ZOX:[id number] DOB: 03-16-1935 DOA: 11/22/2022 PCP: Excell Seltzer, MD     Brief Narrative:  29M with history of paroxysmal A-fib on Eliquis, DM2, hypothyroidism, interstitial cystitis on chronic suppressive antibiotic, dementia, hard of hearing admitted for altered mental status likely secondary to urinary tract infection.  Urine cultures growing Klebsiella/Aerococcus.  Klebsiella is sensitive to Rocephin.   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  Klebsiella sensitive to Unasyn (eventually plans for Augmentin). Last day 10/11  Elevated ammonia - Lactulose  Dehydration with hypomagnesemia/hypokalemia/hypophosphatemia - IV fluid with repletion.  Very poor oral intake  Diabetes mellitus type 2, uncontrolled secondary to hyperglycemia -On sliding scale and Accu-Cheks.  Adjust long-acting and Premeal insulin as needed.  Major depressive disorder -Cymbalta  Hypothyroidism -Synthroid  Paroxysmal atrial fibrillation -Eliquis.  Supportive care.  Patient aggressively needs to use incentive spirometer.  [facility] Speech and swallow evaluation-dysphagia 2 diet  DVT prophylaxis: a Eliquis Code Status: Full code Family Communication: Son at bedside Status is: Inpatient Awaiting improvement in encephalopathy.  Hopefully discharge in next 1-2 days            Subjective: Son at bedside.  Patient is resting.  Still has poor oral intake.  No other  complaints.  Overall still quite weak   Examination:  General exam: Appears calm and comfortable, generally frail and weak Respiratory system: Bibasilar crackles Cardiovascular system: S1 & S2 heard, RRR. No JVD, murmurs, rubs, gallops or clicks. No pedal edema. Gastrointestinal system: Abdomen is nondistended, soft and nontender. No organomegaly or masses felt. Normal bowel sounds heard. Central nervous system: Alert and oriented. No focal neurological deficits. Extremities: Symmetric 4 x 5 power. Skin: No rashes, lesions or ulcers Psychiatry: Judgement and insight appear poor     Diet Orders (From admission, onward)     Start     Ordered   11/25/22 1429  DIET DYS 2 Room service appropriate? No; Fluid consistency: Thin  Diet effective now       Question Answer Comment  Room service appropriate? No   Fluid consistency: Thin      11/25/22 1428            Objective: Vitals:   11/25/22 1522 11/25/22 2135 11/26/22 0439 11/26/22 0800  BP: (!) 143/73 (!) 131/49 (!) 138/106 (!) 141/54  Pulse: 79 88 93 75  Resp:  18 18 18   Temp: 98.5 F (36.9 C) 98.3 F (36.8 C) 99.5 F (37.5 C) 98 F (36.7 C)  TempSrc: Oral Oral Oral   SpO2: 94% 93% 91% 90%  Weight:      Height:        Intake/Output Summary (Last 24 hours) at 11/26/2022 1047 Last data filed at 11/25/2022 1809 Gross per 24 hour  Intake 287 ml  Output 200 ml  Net 87 ml   Filed Weights   11/22/22 1924  Weight: 59.4 kg    Scheduled Meds:  apixaban  2.5 mg Oral BID   DULoxetine  30 mg Oral Daily  insulin aspart  0-15 Units Subcutaneous TID WC   insulin aspart  0-5 Units Subcutaneous QHS   insulin aspart  6 Units Subcutaneous TID WC   insulin glargine-yfgn  20 Units Subcutaneous Daily   lactulose  20 g Oral TID   levothyroxine  75 mcg Oral Daily   Continuous Infusions:  ampicillin-sulbactam (UNASYN) IV 1.5 g (11/26/22 0555)   magnesium sulfate bolus IVPB      Nutritional status     Body mass index is  21.14 kg/m.  Data Reviewed:   CBC: Recent Labs  Lab 11/22/22 1951 11/23/22 0209 11/25/22 0525 11/26/22 0528  WBC 13.4* 11.6* 9.5 9.9  NEUTROABS 10.2* 9.1*  --   --   HGB 10.3* 8.9* 8.9* 8.8*  HCT 31.8* 27.2* 26.9* 26.7*  MCV 90.6 93.8 89.1 88.7  PLT 232 202 249 283   Basic Metabolic Panel: Recent Labs  Lab 11/22/22 1951 11/23/22 0209 11/24/22 1008 11/25/22 0525 11/26/22 0528  NA 132* 132* 129* 129* 135  K 3.9 3.8 3.0* 3.8 3.7  CL 96* 100 100 98 103  CO2 25 21* 20* 18* 22  GLUCOSE 207* 269* 351* 320* 223*  BUN 14 10 <5* <5* <5*  CREATININE 0.89 0.77 0.75 0.78 0.68  CALCIUM 9.0 8.1* 7.9* 7.7* 8.4*  MG  --  1.5* 1.4* 2.0 1.6*  PHOS  --   --  1.6*  --   --    GFR: Estimated Creatinine Clearance: 48.1 mL/min (by C-G formula based on SCr of 0.68 mg/dL). Liver Function Tests: Recent Labs  Lab 11/22/22 1951 11/24/22 1008  AST 17  --   ALT 12  --   ALKPHOS 75  --   BILITOT 0.9  --   PROT 7.2  --   ALBUMIN 2.9* 2.3*   No results for input(s): "LIPASE", "AMYLASE" in the last 168 hours. Recent Labs  Lab 11/24/22 1349  AMMONIA 43*   Coagulation Profile: No results for input(s): "INR", "PROTIME" in the last 168 hours. Cardiac Enzymes: No results for input(s): "CKTOTAL", "CKMB", "CKMBINDEX", "TROPONINI" in the last 168 hours. BNP (last 3 results) No results for input(s): "PROBNP" in the last 8760 hours. HbA1C: No results for input(s): "HGBA1C" in the last 72 hours. CBG: Recent Labs  Lab 11/25/22 0808 11/25/22 1236 11/25/22 1621 11/25/22 2136 11/26/22 0804  GLUCAP 289* 244* 177* 244* 229*   Lipid Profile: No results for input(s): "CHOL", "HDL", "LDLCALC", "TRIG", "CHOLHDL", "LDLDIRECT" in the last 72 hours. Thyroid Function Tests: No results for input(s): "TSH", "T4TOTAL", "FREET4", "T3FREE", "THYROIDAB" in the last 72 hours. Anemia Panel: No results for input(s): "VITAMINB12", "FOLATE", "FERRITIN", "TIBC", "IRON", "RETICCTPCT" in the last 72  hours. Sepsis Labs: Recent Labs  Lab 11/22/22 2000  LATICACIDVEN 2.7*    Recent Results (from the past 240 hour(s))  Urine Culture     Status: Abnormal   Collection Time: 11/22/22  7:29 PM   Specimen: Urine, Random  Result Value Ref Range Status   Specimen Description URINE, RANDOM  Final   Special Requests ADDED 0126 11/23/2022  Final   Culture (A)  Final    80,000 COLONIES/mL KLEBSIELLA PNEUMONIAE >=100,000 COLONIES/mL AEROCOCCUS SPECIES Standardized susceptibility testing for this organism is not available. Performed at [facility], [address], [place], [place] [address]    Report Status 11/25/2022 FINAL  Final   Organism ID, Bacteria KLEBSIELLA PNEUMONIAE (A)  Final      Susceptibility   Klebsiella pneumoniae - MIC*    AMPICILLIN  RESISTANT Resistant     CEFAZOLIN <=4 SENSITIVE Sensitive     CEFEPIME <=0.12 SENSITIVE Sensitive     CEFTRIAXONE <=0.25 SENSITIVE Sensitive     CIPROFLOXACIN >=4 RESISTANT Resistant     GENTAMICIN <=1 SENSITIVE Sensitive     IMIPENEM <=0.25 SENSITIVE Sensitive     NITROFURANTOIN 64 INTERMEDIATE Intermediate     TRIMETH/SULFA >=320 RESISTANT Resistant     AMPICILLIN/SULBACTAM 4 SENSITIVE Sensitive     PIP/TAZO <=4 SENSITIVE Sensitive ug/mL    * 80,000 COLONIES/mL KLEBSIELLA PNEUMONIAE  Culture, blood (routine x 2)     Status: None (Preliminary result)   Collection Time: 11/22/22  8:25 PM   Specimen: BLOOD RIGHT FOREARM  Result Value Ref Range Status   Specimen Description BLOOD RIGHT FOREARM  Final   Special Requests   Final    BOTTLES DRAWN AEROBIC AND ANAEROBIC Blood Culture results may not be optimal due to an excessive volume of blood received in culture bottles   Culture   Final    NO GROWTH 4 DAYS Performed at [facility], [address], [place], [place] [address]    Report Status PENDING  Incomplete  Culture, blood (routine x 2)     Status: None (Preliminary result)   Collection Time: 11/22/22  8:30 PM    Specimen: BLOOD LEFT FOREARM  Result Value Ref Range Status   Specimen Description BLOOD LEFT FOREARM  Final   Special Requests   Final    BOTTLES DRAWN AEROBIC AND ANAEROBIC Blood Culture results may not be optimal due to an excessive volume of blood received in culture bottles   Culture   Final    NO GROWTH 4 DAYS Performed at [facility], [address], [place], [place] [address]    Report Status PENDING  Incomplete  SARS Coronavirus 2 by RT PCR (hospital order, performed in [facility] hospital lab) *cepheid single result test* Anterior Nasal Swab     Status: None   Collection Time: 11/22/22  8:47 PM   Specimen: Anterior Nasal Swab  Result Value Ref Range Status   SARS Coronavirus 2 by RT PCR NEGATIVE NEGATIVE Final    Comment: Performed at [facility], [address] [place], [place] [address]         Radiology Studies: No results found.         LOS: 4 days   Time spent= 35 mins    Miguel Rota, MD [facility]  If 7PM-7AM, please contact night-coverage  11/26/2022, 10:47 AM

## 2022-11-27 DIAGNOSIS — N390 Urinary tract infection, site not specified: Secondary | ICD-10-CM | POA: Diagnosis not present

## 2022-11-27 LAB — CULTURE, BLOOD (ROUTINE X 2)
Culture: NO GROWTH
Culture: NO GROWTH

## 2022-11-27 LAB — CBC
HCT: 26.6 % — ABNORMAL LOW (ref 36.0–46.0)
Hemoglobin: 8.9 g/dL — ABNORMAL LOW (ref 12.0–15.0)
MCH: 30.4 pg (ref 26.0–34.0)
MCHC: 33.5 g/dL (ref 30.0–36.0)
MCV: 90.8 fL (ref 80.0–100.0)
Platelets: 285 10*3/uL (ref 150–400)
RBC: 2.93 MIL/uL — ABNORMAL LOW (ref 3.87–5.11)
RDW: 14.4 % (ref 11.5–15.5)
WBC: 8.9 10*3/uL (ref 4.0–10.5)
nRBC: 0 % (ref 0.0–0.2)

## 2022-11-27 LAB — BASIC METABOLIC PANEL
Anion gap: 8 (ref 5–15)
BUN: <5 mg/dL — ABNORMAL LOW (ref 8–23)
CO2: 25 mmol/L (ref 22–32)
Calcium: 8.5 mg/dL — ABNORMAL LOW (ref 8.9–10.3)
Chloride: 105 mmol/L (ref 98–111)
Creatinine, Ser: 0.63 mg/dL (ref 0.44–1.00)
GFR, Estimated: >60 mL/min (ref >60)
Glucose, Bld: 194 mg/dL — ABNORMAL HIGH (ref 70–99)
Potassium: 3.3 mmol/L — ABNORMAL LOW (ref 3.5–5.1)
Sodium: 138 mmol/L (ref 135–145)

## 2022-11-27 LAB — GLUCOSE, CAPILLARY
Glucose-Capillary: 179 mg/dL — ABNORMAL HIGH (ref 70–99)
Glucose-Capillary: 191 mg/dL — ABNORMAL HIGH (ref 70–99)
Glucose-Capillary: 203 mg/dL — ABNORMAL HIGH (ref 70–99)
Glucose-Capillary: 252 mg/dL — ABNORMAL HIGH (ref 70–99)

## 2022-11-27 LAB — MAGNESIUM: Magnesium: 1.8 mg/dL (ref 1.7–2.4)

## 2022-11-27 MED ORDER — INSULIN GLARGINE-YFGN 100 UNIT/ML ~~LOC~~ SOLN
23.0000 [IU] | Freq: Every day | SUBCUTANEOUS | Status: DC
  Administered 2022-11-27: 23 [IU] via SUBCUTANEOUS
  Filled 2022-11-27 (×2): qty 0.23

## 2022-11-27 MED ORDER — POTASSIUM CHLORIDE 10 MEQ/100ML IV SOLN
10.0000 meq | INTRAVENOUS | Status: AC
  Administered 2022-11-27 (×4): 10 meq via INTRAVENOUS
  Filled 2022-11-27 (×4): qty 100

## 2022-11-27 NOTE — Plan of Care (Signed)
  Problem: Clinical Measurements: Goal: Diagnostic test results will improve Outcome: Progressing Goal: Signs and symptoms of infection will decrease Outcome: Progressing   Problem: Education: Goal: Knowledge of disease or condition will improve Outcome: Progressing Goal: Knowledge of secondary prevention will improve (MUST DOCUMENT ALL) Outcome: Progressing Goal: Knowledge of patient specific risk factors will improve Loraine Leriche N/A or DELETE if not current risk factor) Outcome: Progressing   Problem: Ischemic Stroke/TIA Tissue Perfusion: Goal: Complications of ischemic stroke/TIA will be minimized Outcome: Progressing

## 2022-11-27 NOTE — Plan of Care (Signed)
  Problem: Fluid Volume: Goal: Hemodynamic stability will improve Outcome: Progressing   Problem: Clinical Measurements: Goal: Diagnostic test results will improve Outcome: Progressing Goal: Signs and symptoms of infection will decrease Outcome: Progressing   Problem: Respiratory: Goal: Ability to maintain adequate ventilation will improve Outcome: Progressing   Problem: Education: Goal: Knowledge of disease or condition will improve Outcome: Progressing Goal: Knowledge of secondary prevention will improve (MUST DOCUMENT ALL) Outcome: Progressing Goal: Knowledge of patient specific risk factors will improve Elta [place] N/A or DELETE if not current risk factor) Outcome: Progressing   Problem: Ischemic Stroke/TIA Tissue Perfusion: Goal: Complications of ischemic stroke/TIA will be minimized Outcome: Progressing   Problem: Coping: Goal: Will verbalize positive feelings about self Outcome: Progressing Goal: Will identify appropriate support needs Outcome: Progressing   Problem: Health Behavior/Discharge Planning: Goal: Ability to manage health-related needs will improve Outcome: Progressing Goal: Goals will be collaboratively established with patient/family Outcome: Progressing   Problem: Self-Care: Goal: Ability to participate in self-care as condition permits will improve Outcome: Progressing Goal: Verbalization of feelings and concerns over difficulty with self-care will improve Outcome: Progressing Goal: Ability to communicate needs accurately will improve Outcome: Progressing   Problem: Nutrition: Goal: Risk of aspiration will decrease Outcome: Progressing Goal: Dietary intake will improve Outcome: Progressing   Problem: Education: Goal: Ability to describe self-care measures that may prevent or decrease complications (Diabetes Survival Skills Education) will improve Outcome: Progressing Goal: Individualized Educational Video(s) Outcome: Progressing   Problem:  Coping: Goal: Ability to adjust to condition or change in health will improve Outcome: Progressing   Problem: Fluid Volume: Goal: Ability to maintain a balanced intake and output will improve Outcome: Progressing   Problem: Health Behavior/Discharge Planning: Goal: Ability to identify and utilize available resources and services will improve Outcome: Progressing Goal: Ability to manage health-related needs will improve Outcome: Progressing   Problem: Metabolic: Goal: Ability to maintain appropriate glucose levels will improve Outcome: Progressing   Problem: Nutritional: Goal: Maintenance of adequate nutrition will improve Outcome: Progressing Goal: Progress toward achieving an optimal weight will improve Outcome: Progressing   Problem: Skin Integrity: Goal: Risk for impaired skin integrity will decrease Outcome: Progressing   Problem: Tissue Perfusion: Goal: Adequacy of tissue perfusion will improve Outcome: Progressing   Problem: Education: Goal: Knowledge of General Education information will improve Description: Including pain rating scale, medication(s)/side effects and non-pharmacologic comfort measures Outcome: Progressing   Problem: Health Behavior/Discharge Planning: Goal: Ability to manage health-related needs will improve Outcome: Progressing   Problem: Clinical Measurements: Goal: Ability to maintain clinical measurements within normal limits will improve Outcome: Progressing Goal: Will remain free from infection Outcome: Progressing Goal: Diagnostic test results will improve Outcome: Progressing Goal: Respiratory complications will improve Outcome: Progressing Goal: Cardiovascular complication will be avoided Outcome: Progressing   Problem: Activity: Goal: Risk for activity intolerance will decrease Outcome: Progressing   Problem: Nutrition: Goal: Adequate nutrition will be maintained Outcome: Progressing   Problem: Coping: Goal: Level of  anxiety will decrease Outcome: Progressing   Problem: Elimination: Goal: Will not experience complications related to bowel motility Outcome: Progressing Goal: Will not experience complications related to urinary retention Outcome: Progressing   Problem: Pain Managment: Goal: General experience of comfort will improve Outcome: Progressing   Problem: Safety: Goal: Ability to remain free from injury will improve Outcome: Progressing   Problem: Skin Integrity: Goal: Risk for impaired skin integrity will decrease Outcome: Progressing

## 2022-11-27 NOTE — Progress Notes (Signed)
PROGRESS NOTE    Jaclyn [name]  XLK:[id number] DOB: October 24, 1935 DOA: 11/22/2022 PCP: Excell Seltzer, MD     Brief Narrative:  73M with history of paroxysmal A-fib on Eliquis, DM2, hypothyroidism, interstitial cystitis on chronic suppressive antibiotic, dementia, hard of hearing admitted for altered mental status likely secondary to urinary tract infection.  Urine cultures growing Klebsiella/Aerococcus.  Klebsiella is sensitive to Rocephin.   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  Klebsiella sensitive to Unasyn (eventually plans for Augmentin). Last day 10/11 -MRI brain-negative  Elevated ammonia, resolved - Resolved with lactulose.  Dehydration with hypomagnesemia/hypokalemia/hypophosphatemia - IV fluid with repletion.  Very poor oral intake  Diabetes mellitus type 2, uncontrolled secondary to hyperglycemia -On sliding scale and Accu-Cheks.  Adjust long-acting and Premeal insulin as needed.  Major depressive disorder -Cymbalta  Hypothyroidism -Synthroid  Paroxysmal atrial fibrillation -Eliquis.  Supportive care.  Patient aggressively needs to use incentive spirometer.  [facility] Speech and swallow evaluation-dysphagia 2 diet  DVT prophylaxis: a Eliquis Code Status: Full code Family Communication: Son at bedside Status is: Inpatient Awaiting improvement in encephalopathy.  Hopefully discharge in next 1-2 days.  [facility]fully back to [facility]            Subjective: Son  at bedside.   No other complaints.  Sleepy this morning but tells me she usually wakes up but later in the day.  Appetite still remains very poor   Examination:  General exam: Appears calm and comfortable, elderly frail Respiratory system: Clear to auscultation. Respiratory effort normal. Cardiovascular system: S1 & S2 heard, RRR. No JVD, murmurs, rubs, gallops or clicks. No pedal edema. Gastrointestinal system: Abdomen is nondistended, soft and nontender. No organomegaly or masses felt. Normal bowel sounds heard. Central nervous system: Alert and oriented. No focal neurological deficits. Extremities: Symmetric 4 x 5 power. Skin: No rashes, lesions or ulcers Psychiatry: Judgement and insight appear poor      Diet Orders (From admission, onward)     Start     Ordered   11/25/22 1429  DIET DYS 2 Room service appropriate? No; Fluid consistency: Thin  Diet effective now       Question Answer Comment  Room service appropriate? No   Fluid consistency: Thin      11/25/22 1428            Objective: Vitals:   11/26/22 1602 11/26/22 2053 11/27/22 0557 11/27/22 0824  BP: (!) 121/45 (!) 117/49 (!) 146/60 127/81  Pulse: 79 76 75 76  Resp: 18 16 16 17   Temp: 98 F (36.7 C) 98.7 F (37.1 C) 98.3 F (36.8 C) 97.9 F (36.6 C)  TempSrc: Oral Oral  Oral  SpO2: 94% 92% 95% 96%  Weight:      Height:        Intake/Output Summary (Last 24 hours) at 11/27/2022 1112 Last data filed at 11/27/2022 0053 Gross per 24 hour  Intake 939.59 ml  Output 900 ml  Net 39.59 ml   Filed Weights   11/22/22 1924  Weight: 59.4  kg    Scheduled Meds:  apixaban  2.5 mg Oral BID   DULoxetine  30 mg Oral Daily   insulin aspart  0-15 Units Subcutaneous TID WC   insulin aspart  0-5 Units Subcutaneous QHS   insulin aspart  6 Units Subcutaneous TID WC   insulin glargine-yfgn  23 Units Subcutaneous Daily   levothyroxine  75 mcg Oral Daily   Continuous Infusions:  ampicillin-sulbactam (UNASYN) IV  1.5 g (11/27/22 0522)   potassium chloride      Nutritional status     Body mass index is 21.14 kg/m.  Data Reviewed:   CBC: Recent Labs  Lab 11/22/22 1951 11/23/22 0209 11/25/22 0525 11/26/22 0528 11/27/22 0806  WBC 13.4* 11.6* 9.5 9.9 8.9  NEUTROABS 10.2* 9.1*  --   --   --   HGB 10.3* 8.9* 8.9* 8.8* 8.9*  HCT 31.8* 27.2* 26.9* 26.7* 26.6*  MCV 90.6 93.8 89.1 88.7 90.8  PLT 232 202 249 283 285   Basic Metabolic Panel: Recent Labs  Lab 11/23/22 0209 11/24/22 1008 11/25/22 0525 11/26/22 0528 11/27/22 0806  NA 132* 129* 129* 135 138  K 3.8 3.0* 3.8 3.7 3.3*  CL 100 100 98 103 105  CO2 21* 20* 18* 22 25  GLUCOSE 269* 351* 320* 223* 194*  BUN 10 <5* <5* <5* <5*  CREATININE 0.77 0.75 0.78 0.68 0.63  CALCIUM 8.1* 7.9* 7.7* 8.4* 8.5*  MG 1.5* 1.4* 2.0 1.6* 1.8  PHOS  --  1.6*  --   --   --    GFR: Estimated Creatinine Clearance: 48.1 mL/min (by C-G formula based on SCr of 0.63 mg/dL). Liver Function Tests: Recent Labs  Lab 11/22/22 1951 11/24/22 1008  AST 17  --   ALT 12  --   ALKPHOS 75  --   BILITOT 0.9  --   PROT 7.2  --   ALBUMIN 2.9* 2.3*   No results for input(s): "LIPASE", "AMYLASE" in the last 168 hours. Recent Labs  Lab 11/24/22 1349 11/26/22 1102  AMMONIA 43* 17   Coagulation Profile: No results for input(s): "INR", "PROTIME" in the last 168 hours. Cardiac Enzymes: No results for input(s): "CKTOTAL", "CKMB", "CKMBINDEX", "TROPONINI" in the last 168 hours. BNP (last 3 results) No results for input(s): "PROBNP" in the last 8760 hours. HbA1C: No results for input(s): "HGBA1C" in the last 72 hours. CBG: Recent Labs  Lab 11/26/22 0804 11/26/22 1207 11/26/22 1601 11/26/22 2054 11/27/22 0821  GLUCAP 229* 245* 210* 224* 179*   Lipid Profile: No results for input(s): "CHOL", "HDL", "LDLCALC", "TRIG", "CHOLHDL", "LDLDIRECT" in the last 72 hours. Thyroid Function Tests: No results for input(s): "TSH", "T4TOTAL", "FREET4", "T3FREE",  "THYROIDAB" in the last 72 hours. Anemia Panel: No results for input(s): "VITAMINB12", "FOLATE", "FERRITIN", "TIBC", "IRON", "RETICCTPCT" in the last 72 hours. Sepsis Labs: Recent Labs  Lab 11/22/22 2000  LATICACIDVEN 2.7*    Recent Results (from the past 240 hour(s))  Urine Culture     Status: Abnormal   Collection Time: 11/22/22  7:29 PM   Specimen: Urine, Random  Result Value Ref Range Status   Specimen Description URINE, RANDOM  Final   Special Requests ADDED 0126 11/23/2022  Final   Culture (A)  Final    80,000 COLONIES/mL KLEBSIELLA PNEUMONIAE >=100,000 COLONIES/mL AEROCOCCUS SPECIES Standardized susceptibility testing for this organism is not available. Performed at [facility], [address] [place], [place] [address]    Report Status 11/25/2022 FINAL  Final  Organism ID, Bacteria KLEBSIELLA PNEUMONIAE (A)  Final      Susceptibility   Klebsiella pneumoniae - MIC*    AMPICILLIN RESISTANT Resistant     CEFAZOLIN <=4 SENSITIVE Sensitive     CEFEPIME <=0.12 SENSITIVE Sensitive     CEFTRIAXONE <=0.25 SENSITIVE Sensitive     CIPROFLOXACIN >=4 RESISTANT Resistant     GENTAMICIN <=1 SENSITIVE Sensitive     IMIPENEM <=0.25 SENSITIVE Sensitive     NITROFURANTOIN 64 INTERMEDIATE Intermediate     TRIMETH/SULFA >=320 RESISTANT Resistant     AMPICILLIN/SULBACTAM 4 SENSITIVE Sensitive     PIP/TAZO <=4 SENSITIVE Sensitive ug/mL    * 80,000 COLONIES/mL KLEBSIELLA PNEUMONIAE  Culture, blood (routine x 2)     Status: None (Preliminary result)   Collection Time: 11/22/22  8:25 PM   Specimen: BLOOD RIGHT FOREARM  Result Value Ref Range Status   Specimen Description BLOOD RIGHT FOREARM  Final   Special Requests   Final    BOTTLES DRAWN AEROBIC AND ANAEROBIC Blood Culture results may not be optimal due to an excessive volume of blood received in culture bottles   Culture   Final    NO GROWTH 4 DAYS Performed at [facility], [address] [place], [place] [address]     Report Status PENDING  Incomplete  Culture, blood (routine x 2)     Status: None (Preliminary result)   Collection Time: 11/22/22  8:30 PM   Specimen: BLOOD LEFT FOREARM  Result Value Ref Range Status   Specimen Description BLOOD LEFT FOREARM  Final   Special Requests   Final    BOTTLES DRAWN AEROBIC AND ANAEROBIC Blood Culture results may not be optimal due to an excessive volume of blood received in culture bottles   Culture   Final    NO GROWTH 4 DAYS Performed at [facility], [address] [place], [place] [address]    Report Status PENDING  Incomplete  SARS Coronavirus 2 by RT PCR (hospital order, performed in [facility] hospital lab) *cepheid single result test* Anterior Nasal Swab     Status: None   Collection Time: 11/22/22  8:47 PM   Specimen: Anterior Nasal Swab  Result Value Ref Range Status   SARS Coronavirus 2 by RT PCR NEGATIVE NEGATIVE Final    Comment: Performed at [facility], [address] [place], [place] [address]         Radiology Studies: MR BRAIN WO CONTRAST  Result Date: 11/26/2022 CLINICAL DATA:  Initial evaluation for acute TIA. EXAM: MRI HEAD WITHOUT CONTRAST TECHNIQUE: Multiplanar, multiecho pulse sequences of the brain and surrounding structures were obtained without intravenous contrast. COMPARISON:  CT from 11/22/2022. FINDINGS: Brain: Examination degraded by motion artifact. Generalized age-related cerebral atrophy with moderate chronic microvascular ischemic disease. Few scattered rib remote cerebellar infarcts noted. No evidence for acute or subacute ischemia. Gray-white matter differentiation maintained. No acute intracranial hemorrhage. Single chronic microhemorrhage noted at the central cerebellum. No mass lesion, midline shift, or mass effect. Diffuse ventricular prominence most likely related to global parenchymal volume loss without hydrocephalus. No extra-axial fluid collection. Pituitary gland and suprasellar region within  normal limits. Vascular: Major intracranial vascular flow voids are grossly maintained. Skull and upper cervical spine: Craniocervical junction within normal limits. Bone marrow signal intensity normal. No scalp soft tissue abnormality. Sinuses/Orbits: Prior ocular lens replacement on the left. Scattered mucosal thickening noted about the sphenoethmoidal sinuses. No significant mastoid effusion. Other: None. IMPRESSION: 1. No acute  intracranial abnormality. 2. Age-related cerebral atrophy with moderate chronic microvascular ischemic disease, with a few scattered remote cerebellar infarcts. Electronically Signed   By: Rise Mu M.D.   On: 11/26/2022 18:38           LOS: 5 days   Time spent= 35 mins    Miguel Rota, MD [facility]  If 7PM-7AM, please contact night-coverage  11/27/2022, 11:12 AM

## 2022-11-27 NOTE — Progress Notes (Addendum)
Speech Language Pathology Treatment: Dysphagia  Patient Details Name: Jaclyn [name] MRN: [id number] DOB: 05-30-1935 Today's Date: 11/27/2022 Time: 4098-1191 SLP Time Calculation (min) (ACUTE ONLY): 20 min  Assessment / Plan / Recommendation Clinical Impression  Pt's son in room this date, providing assistance with feeding. Pt initially lethargic, although roused to persistent cueing and remained alert throughout the session but with her eyes closed. During trials of Dys 1, Dys 2, and regular texture solids, pt presents with prolonged mastication and oral transit, at times masticating for minutes at a time before initiating a swallow. Suspect this is primarily due to her mentation and missing dentition. Her son reports she typically has difficulty with mastication and presentation this date most closely resembles baseline, although he states she usually eats regular texture solids. Consecutive straw sips resulted intermittently in a delayed cough, although note pt also coughing prior to and well after PO trials. Pt's cough resolved with controlled, single sips. Her performance greatly improved provided careful hand feeding from her son. Recommend continuing diet of Dys 2 textures with thin liquids with potential to upgrade diet to baseline upon d/c with SLP at next venue. She will likely require ongoing management of diet modification as mentation potentially fluctuates. Will f/u briefly to assess pt's ability to upgrade solid texture acutely.    HPI HPI: Jaclyn [name] is a 87 yo female presenting to ED 10/5 from [facility] with AMS. Found to be encephalopathic due to UTI. SLP ordered due to PT reports of oral pocketing with regular texture diet. PMH includes paroxysmal A-fib on Eliquis, T2DM, hypothyroidism, interstitial cystitis on chronic suppressive antibiotic therapy, dementia, hard of hearing      SLP Plan  Continue with current plan of care      Recommendations for follow up therapy  are one component of a multi-disciplinary discharge planning process, led by the attending physician.  Recommendations may be updated based on patient status, additional functional criteria and insurance authorization.    Recommendations  Diet recommendations: Dysphagia 2 (fine chop);Thin liquid Liquids provided via: Straw;Cup Medication Administration: Whole meds with liquid Supervision: Trained caregiver to feed patient Compensations: Slow rate;Small sips/bites Postural Changes and/or Swallow Maneuvers: Seated upright 90 degrees                  Oral care BID;Staff/trained caregiver to provide oral care   Frequent or constant Supervision/Assistance Dysphagia, unspecified (R13.10)     Continue with current plan of care     [name], M.A., CF-SLP Speech Language Pathology, Acute Rehabilitation Services  Secure Chat preferred [phone]   11/27/2022, 10:36 AM

## 2022-11-28 DIAGNOSIS — I959 Hypotension, unspecified: Secondary | ICD-10-CM | POA: Diagnosis not present

## 2022-11-28 DIAGNOSIS — Z7401 Bed confinement status: Secondary | ICD-10-CM | POA: Diagnosis not present

## 2022-11-28 DIAGNOSIS — I480 Paroxysmal atrial fibrillation: Secondary | ICD-10-CM | POA: Diagnosis not present

## 2022-11-28 DIAGNOSIS — N3000 Acute cystitis without hematuria: Secondary | ICD-10-CM | POA: Diagnosis not present

## 2022-11-28 DIAGNOSIS — R531 Weakness: Secondary | ICD-10-CM | POA: Diagnosis not present

## 2022-11-28 DIAGNOSIS — I69352 Hemiplegia and hemiparesis following cerebral infarction affecting left dominant side: Secondary | ICD-10-CM | POA: Diagnosis not present

## 2022-11-28 DIAGNOSIS — R4182 Altered mental status, unspecified: Secondary | ICD-10-CM | POA: Diagnosis not present

## 2022-11-28 LAB — CBC
HCT: 31.4 % — ABNORMAL LOW (ref 36.0–46.0)
Hemoglobin: 10 g/dL — ABNORMAL LOW (ref 12.0–15.0)
MCH: 28.9 pg (ref 26.0–34.0)
MCHC: 31.8 g/dL (ref 30.0–36.0)
MCV: 90.8 fL (ref 80.0–100.0)
Platelets: 308 10*3/uL (ref 150–400)
RBC: 3.46 MIL/uL — ABNORMAL LOW (ref 3.87–5.11)
RDW: 14.5 % (ref 11.5–15.5)
WBC: 8.7 10*3/uL (ref 4.0–10.5)
nRBC: 0 % (ref 0.0–0.2)

## 2022-11-28 LAB — MAGNESIUM: Magnesium: 1.5 mg/dL — ABNORMAL LOW (ref 1.7–2.4)

## 2022-11-28 LAB — BASIC METABOLIC PANEL
Anion gap: 12 (ref 5–15)
BUN: <5 mg/dL — ABNORMAL LOW (ref 8–23)
CO2: 24 mmol/L (ref 22–32)
Calcium: 8.7 mg/dL — ABNORMAL LOW (ref 8.9–10.3)
Chloride: 103 mmol/L (ref 98–111)
Creatinine, Ser: 0.69 mg/dL (ref 0.44–1.00)
GFR, Estimated: >60 mL/min (ref >60)
Glucose, Bld: 217 mg/dL — ABNORMAL HIGH (ref 70–99)
Potassium: 4 mmol/L (ref 3.5–5.1)
Sodium: 139 mmol/L (ref 135–145)

## 2022-11-28 LAB — GLUCOSE, CAPILLARY: Glucose-Capillary: 196 mg/dL — ABNORMAL HIGH (ref 70–99)

## 2022-11-28 MED ORDER — POLYETHYLENE GLYCOL 3350 17 G PO PACK: 17.0000 g | PACK | Freq: Every day | ORAL | Status: DC | PRN

## 2022-11-28 MED ORDER — AMOXICILLIN-POT CLAVULANATE 875-125 MG PO TABS
1.0000 | ORAL_TABLET | Freq: Two times a day (BID) | ORAL | Status: DC
  Administered 2022-11-28: 1 via ORAL
  Filled 2022-11-28: qty 1

## 2022-11-28 MED ORDER — INSULIN GLARGINE-YFGN 100 UNIT/ML ~~LOC~~ SOLN
27.0000 [IU] | Freq: Every day | SUBCUTANEOUS | Status: DC
  Filled 2022-11-28: qty 0.27

## 2022-11-28 MED ORDER — AMOXICILLIN-POT CLAVULANATE 875-125 MG PO TABS: 1.0000 | ORAL_TABLET | Freq: Two times a day (BID) | ORAL | Status: AC

## 2022-11-28 NOTE — Discharge Summary (Signed)
Physician Discharge Summary  Gennifer Porche UXL:[id number] DOB: June 05, 1935 DOA: 11/22/2022  PCP: Excell Seltzer, MD  Admit date: 11/22/2022 Discharge date: 11/28/2022  Admitted From: LTC Disposition:  LTC  Recommendations for Outpatient Follow-up:  Follow up with PCP in 1-2 weeks Please obtain BMP/CBC in one week your next doctors visit.  PO Augmentin for 2 more days Closelu monitor blood glucose TID qhs   Discharge Condition: Stable CODE STATUS: Full Diet recommendation: Diabetic    Brief Narrative:  89M with history of paroxysmal A-fib on Eliquis, DM2, hypothyroidism, interstitial cystitis on chronic suppressive antibiotic, dementia, hard of hearing admitted for altered mental status likely secondary to urinary tract infection.  Urine cultures growing Klebsiella/Aerococcus.  Klebsiella is sensitive to Rocephin.  Eventually cultures were pansensitive therefore transition to IV Unasyn.  Eventually she was transitioned to p.o. Augmentin as she was tolerating orals without any issues.   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  Klebsiella sensitive to Unasyn> Augmentin. Last day 10/12 -MRI brain-negative  Elevated ammonia, resolved - Resolved with lactulose.  Dehydration with hypomagnesemia/hypokalemia/hypophosphatemia - IV fluid with repletion.  Very poor oral intake  Diabetes mellitus type 2, uncontrolled secondary to hyperglycemia - Resume home medications.  Blood sugars have remained slightly elevated here  in the hospital but because she is on insulin and p.o. medication, son advised to closely monitor blood glucose.  Major depressive disorder -Cymbalta  Hypothyroidism -Synthroid  Paroxysmal atrial fibrillation -Eliquis.  Supportive care.  Patient aggressively needs to use incentive spirometer.  [facility] Speech and swallow evaluation-dysphagia 2 diet  DVT prophylaxis: a Eliquis Code Status: Full code Family Communication: Son at bedside Status is: Inpatient Discharge today    Discharge Diagnoses:  Active Problems:   PAF (paroxysmal atrial fibrillation) (HCC)   Hypothyroidism   Dementia (HCC)   Hyperlipidemia LDL goal <70   Essential hypertension, benign   History of CVA (cerebrovascular accident)   Acute lower UTI   Dyslipidemia   Failure to thrive in adult   Acute metabolic encephalopathy      Consultations: None  Subjective: Sitting up eating her breakfast.  Does not have any complaints.  Overall hard of hearing.  Discharge Exam: Vitals:   11/27/22 1952 11/28/22 0804  BP: (!) 166/69 121/74  Pulse: 72 70  Resp:  16  Temp: 97.8 F (36.6 C) 97.6 F (36.4 C)  SpO2: 94% 95%   Vitals:   11/27/22 0824 11/27/22 1652 11/27/22 1952 11/28/22 0804  BP: 127/81 112/81 (!) 166/69 121/74  Pulse: 76 80 72 70  Resp: 17 18  16   Temp: 97.9 F (36.6 C) 98.6 F (37 C) 97.8 F (36.6 C) 97.6 F (36.4 C)  TempSrc: Oral Oral Oral Oral  SpO2: 96% 99% 94% 95%  Weight:      Height:        General: Pt is alert, awake, not in acute distress, elderly frail.  Very hard of hearing. Cardiovascular: RRR, S1/S2 +, no rubs, no gallops Respiratory: CTA bilaterally, no wheezing, no rhonchi Abdominal: Soft, NT, ND,  bowel sounds + Extremities: no edema, no cyanosis  Discharge Instructions   Allergies as of 11/28/2022       Reactions   Naproxen Sodium Other (See Comments)   Fever/aches and pains   Statins Other (See Comments)   myalgias    Sulfonamide Derivatives Hives, Itching   Not documented on MAR   Latex Itching, Rash   Shellfish Allergy Itching, Swelling, Rash   Seafood, shrimp        Medication List     TAKE these medications    amoxicillin-clavulanate 875-125 MG tablet Commonly known as: AUGMENTIN Take 1 tablet by mouth every 12 (twelve) hours for 2 days.   cyanocobalamin 1000 MCG tablet Take 2,000 mcg by mouth daily.   docusate sodium 100 MG capsule Commonly known as: COLACE Take 100 mg by mouth 2 (two) times daily.   DULoxetine 30 MG capsule Commonly known as: CYMBALTA Take 30 mg by mouth daily.   Eliquis 2.5 MG Tabs tablet Generic drug: apixaban Take 2.5 mg by mouth 2 (two) times daily.   Fish Oil 1000 MG Caps Take 1,000 mg by mouth daily.   guaiFENesin 600 MG 12 hr tablet Commonly known as: MUCINEX Take 600 mg by mouth 2 (two) times daily as needed for cough.   insulin lispro 100 UNIT/ML injection Commonly known as: HUMALOG Inject 3 Units into the skin 3 (three) times daily before meals.   HumaLOG KwikPen 100 UNIT/ML KwikPen Generic drug: insulin lispro Inject 0-12 Units into the skin in the morning, at noon, in the evening, and at bedtime. Before meals and at bedtime   Januvia 100 MG tablet Generic drug: sitaGLIPtin TAKE 1 TABLET BY MOUTH EVERY DAY   Lantus SoloStar 100 UNIT/ML Solostar Pen Generic drug: insulin glargine Inject 18 Units into the skin at bedtime.   levothyroxine 75 MCG tablet Commonly known as: SYNTHROID Take 75 mcg by mouth daily.   magnesium oxide 400 (240 Mg) MG tablet Commonly known as: MAG-OX Take 800 mg by mouth 2 (two) times daily.   metFORMIN 1000 MG tablet Commonly known as: GLUCOPHAGE Take 1,000 mg by mouth 2 (two) times daily.   polyethylene glycol 17 g packet Commonly known as: MIRALAX / GLYCOLAX Take 17 g by mouth daily as needed for mild constipation or moderate constipation.   Vitamin D3 1.25 MG (50000 UT) Caps Take 1 capsule by mouth  once a week.        Contact information for after-discharge care     Destination     [facility] Preferred SNF .   Service: Skilled Nursing Contact information: [address] [place] [address] [phone]                    Allergies  Allergen Reactions   Naproxen Sodium Other (See Comments)    Fever/aches and pains   Statins Other (See Comments)    myalgias   Sulfonamide Derivatives Hives and Itching    Not documented on MAR   Latex Itching and Rash   Shellfish Allergy Itching, Swelling and Rash    Seafood, shrimp    You were cared for by a hospitalist during your hospital stay. If you have any questions about your discharge medications or the care you received while you were in the hospital after you are discharged, you can call the unit and asked to speak with the hospitalist on call if the hospitalist that took care of you is not available. Once you are  discharged, your primary care physician will handle any further medical issues. Please note that no refills for any discharge medications will be authorized once you are discharged, as it is imperative that you return to your primary care physician (or establish a relationship with a primary care physician if you do not have one) for your aftercare needs so that they can reassess your need for medications and monitor your lab values.  You were cared for by a hospitalist during your hospital stay. If you have any questions about your discharge medications or the care you received while you were in the hospital after you are discharged, you can call the unit and asked to speak with the hospitalist on call if the hospitalist that took care of you is not available. Once you are discharged, your primary care physician will handle any further medical issues. Please note that NO REFILLS for any discharge medications will be authorized once you are discharged, as it is imperative that you return to  your primary care physician (or establish a relationship with a primary care physician if you do not have one) for your aftercare needs so that they can reassess your need for medications and monitor your lab values.  Please request your Prim.MD to go over all Hospital Tests and Procedure/Radiological results at the follow up, please get all Hospital records sent to your Prim MD by signing hospital release before you go home.  Get CBC, CMP, 2 view Chest X ray checked  by Primary MD during your next visit or SNF MD in 5-7 days ( we routinely change or add medications that can affect your baseline labs and fluid status, therefore we recommend that you get the mentioned basic workup next visit with your PCP, your PCP may decide not to get them or add new tests based on their clinical decision)  On your next visit with your primary care physician please Get Medicines reviewed and adjusted.  If you experience worsening of your admission symptoms, develop shortness of breath, life threatening emergency, suicidal or homicidal thoughts you must seek medical attention immediately by calling 911 or calling your MD immediately  if symptoms less severe.  You Must read complete instructions/literature along with all the possible adverse reactions/side effects for all the Medicines you take and that have been prescribed to you. Take any new Medicines after you have completely understood and accpet all the possible adverse reactions/side effects.   Do not drive, operate heavy machinery, perform activities at heights, swimming or participation in water activities or provide baby sitting services if your were admitted for syncope or siezures until you have seen by Primary MD or a Neurologist and advised to do so again.  Do not drive when taking Pain medications.   Procedures/Studies: MR BRAIN WO CONTRAST  Result Date: 11/26/2022 CLINICAL DATA:  Initial evaluation for acute TIA. EXAM: MRI HEAD WITHOUT CONTRAST  TECHNIQUE: Multiplanar, multiecho pulse sequences of the brain and surrounding structures were obtained without intravenous contrast. COMPARISON:  CT from 11/22/2022. FINDINGS: Brain: Examination degraded by motion artifact. Generalized age-related cerebral atrophy with moderate chronic microvascular ischemic disease. Few scattered rib remote cerebellar infarcts noted. No evidence for acute or subacute ischemia. Gray-white matter differentiation maintained. No acute intracranial hemorrhage. Single chronic microhemorrhage noted at the central cerebellum. No mass lesion, midline shift, or mass effect. Diffuse ventricular prominence most likely related to global parenchymal volume loss without hydrocephalus. No extra-axial fluid collection. Pituitary gland and suprasellar region within normal limits. Vascular: Major intracranial  vascular flow voids are grossly maintained. Skull and upper cervical spine: Craniocervical junction within normal limits. Bone marrow signal intensity normal. No scalp soft tissue abnormality. Sinuses/Orbits: Prior ocular lens replacement on the left. Scattered mucosal thickening noted about the sphenoethmoidal sinuses. No significant mastoid effusion. Other: None. IMPRESSION: 1. No acute intracranial abnormality. 2. Age-related cerebral atrophy with moderate chronic microvascular ischemic disease, with a few scattered remote cerebellar infarcts. Electronically Signed   By: Rise Mu M.D.   On: 11/26/2022 18:38   CT Head Wo Contrast  Result Date: 11/22/2022 CLINICAL DATA:  Mental status change EXAM: CT HEAD WITHOUT CONTRAST TECHNIQUE: Contiguous axial images were obtained from the base of the skull through the vertex without intravenous contrast. RADIATION DOSE REDUCTION: This exam was performed according to the departmental dose-optimization program which includes automated exposure control, adjustment of the mA and/or kV according to patient size and/or use of iterative  reconstruction technique. COMPARISON:  CT and MRI head 01/09/2022 FINDINGS: Brain: No intracranial hemorrhage, mass effect, or evidence of acute infarct. No hydrocephalus. No extra-axial fluid collection. Generalized cerebral atrophy and chronic small vessel ischemic disease. Vascular: No hyperdense vessel. Intracranial arterial calcification. Skull: No fracture or focal lesion. Sinuses/Orbits: Mucosal thickening in the ethmoid air cells in the sphenoid sinuses, increased from 01/09/2022. Other: None. IMPRESSION: No acute intracranial abnormality. Electronically Signed   By: Minerva Fester M.D.   On: 11/22/2022 22:25   DG Chest Portable 1 View  Result Date: 11/22/2022 CLINICAL DATA:  Altered mental status and cough EXAM: PORTABLE CHEST 1 VIEW COMPARISON:  Radiograph 01/24/2022 FINDINGS: The heart size and mediastinal contours are within normal limits. Low lung volumes. Left basilar atelectasis. No focal consolidation, pleural effusion, or pneumothorax. No displaced rib fractures. IMPRESSION: No active disease. Electronically Signed   By: Minerva Fester M.D.   On: 11/22/2022 21:54     The results of significant diagnostics from this hospitalization (including imaging, microbiology, ancillary and laboratory) are listed below for reference.     Microbiology: Recent Results (from the past 240 hour(s))  Urine Culture     Status: Abnormal   Collection Time: 11/22/22  7:29 PM   Specimen: Urine, Random  Result Value Ref Range Status   Specimen Description URINE, RANDOM  Final   Special Requests ADDED 0126 11/23/2022  Final   Culture (A)  Final    80,000 COLONIES/mL KLEBSIELLA PNEUMONIAE >=100,000 COLONIES/mL AEROCOCCUS SPECIES Standardized susceptibility testing for this organism is not available. Performed at [facility] Lab, [address], [place], [place] [address]    Report Status 11/25/2022 FINAL  Final   Organism ID, Bacteria KLEBSIELLA PNEUMONIAE (A)  Final      Susceptibility    Klebsiella pneumoniae - MIC*    AMPICILLIN RESISTANT Resistant     CEFAZOLIN <=4 SENSITIVE Sensitive     CEFEPIME <=0.12 SENSITIVE Sensitive     CEFTRIAXONE <=0.25 SENSITIVE Sensitive     CIPROFLOXACIN >=4 RESISTANT Resistant     GENTAMICIN <=1 SENSITIVE Sensitive     IMIPENEM <=0.25 SENSITIVE Sensitive     NITROFURANTOIN 64 INTERMEDIATE Intermediate     TRIMETH/SULFA >=320 RESISTANT Resistant     AMPICILLIN/SULBACTAM 4 SENSITIVE Sensitive     PIP/TAZO <=4 SENSITIVE Sensitive ug/mL    * 80,000 COLONIES/mL KLEBSIELLA PNEUMONIAE  Culture, blood (routine x 2)     Status: None   Collection Time: 11/22/22  8:25 PM   Specimen: BLOOD RIGHT FOREARM  Result Value Ref Range Status   Specimen Description BLOOD RIGHT  FOREARM  Final   Special Requests   Final    BOTTLES DRAWN AEROBIC AND ANAEROBIC Blood Culture results may not be optimal due to an excessive volume of blood received in culture bottles   Culture   Final    NO GROWTH 5 DAYS Performed at [facility], [address], [place], [place] [address]    Report Status 11/27/2022 FINAL  Final  Culture, blood (routine x 2)     Status: None   Collection Time: 11/22/22  8:30 PM   Specimen: BLOOD LEFT FOREARM  Result Value Ref Range Status   Specimen Description BLOOD LEFT FOREARM  Final   Special Requests   Final    BOTTLES DRAWN AEROBIC AND ANAEROBIC Blood Culture results may not be optimal due to an excessive volume of blood received in culture bottles   Culture   Final    NO GROWTH 5 DAYS Performed at [facility], [address], [place], [place] [address]    Report Status 11/27/2022 FINAL  Final  SARS Coronavirus 2 by RT PCR (hospital order, performed in [facility] hospital lab) *cepheid single result test* Anterior Nasal Swab     Status: None   Collection Time: 11/22/22  8:47 PM   Specimen: Anterior Nasal Swab  Result Value Ref Range Status   SARS Coronavirus 2 by RT PCR NEGATIVE NEGATIVE Final    Comment: Performed  at [facility] Lab, [address], [place], [place] [address]     Labs: BNP (last 3 results) No results for input(s): "BNP" in the last 8760 hours. Basic Metabolic Panel: Recent Labs  Lab 11/23/22 0209 11/24/22 1008 11/25/22 0525 11/26/22 0528 11/27/22 0806  NA 132* 129* 129* 135 138  K 3.8 3.0* 3.8 3.7 3.3*  CL 100 100 98 103 105  CO2 21* 20* 18* 22 25  GLUCOSE 269* 351* 320* 223* 194*  BUN 10 <5* <5* <5* <5*  CREATININE 0.77 0.75 0.78 0.68 0.63  CALCIUM 8.1* 7.9* 7.7* 8.4* 8.5*  MG 1.5* 1.4* 2.0 1.6* 1.8  PHOS  --  1.6*  --   --   --    Liver Function Tests: Recent Labs  Lab 11/22/22 1951 11/24/22 1008  AST 17  --   ALT 12  --   ALKPHOS 75  --   BILITOT 0.9  --   PROT 7.2  --   ALBUMIN 2.9* 2.3*   No results for input(s): "LIPASE", "AMYLASE" in the last 168 hours. Recent Labs  Lab 11/24/22 1349 11/26/22 1102  AMMONIA 43* 17   CBC: Recent Labs  Lab 11/22/22 1951 11/23/22 0209 11/25/22 0525 11/26/22 0528 11/27/22 0806 11/28/22 0846  WBC 13.4* 11.6* 9.5 9.9 8.9 8.7  NEUTROABS 10.2* 9.1*  --   --   --   --   HGB 10.3* 8.9* 8.9* 8.8* 8.9* 10.0*  HCT 31.8* 27.2* 26.9* 26.7* 26.6* 31.4*  MCV 90.6 93.8 89.1 88.7 90.8 90.8  PLT 232 202 249 283 285 308   Cardiac Enzymes: No results for input(s): "CKTOTAL", "CKMB", "CKMBINDEX", "TROPONINI" in the last 168 hours. BNP: Invalid input(s): "POCBNP" CBG: Recent Labs  Lab 11/27/22 0821 11/27/22 1155 11/27/22 1647 11/27/22 1955 11/28/22 0803  GLUCAP 179* 191* 203* 252* 196*   D-Dimer No results for input(s): "DDIMER" in the last 72 hours. Hgb A1c No results for input(s): "HGBA1C" in the last 72 hours. Lipid Profile No results for input(s): "CHOL", "HDL", "LDLCALC", "TRIG", "CHOLHDL", "LDLDIRECT" in the  last 72 hours. Thyroid function studies No results for input(s): "TSH", "T4TOTAL", "T3FREE", "THYROIDAB" in the last 72 hours.  Invalid input(s): "FREET3" Anemia work up No results for input(s):  "VITAMINB12", "FOLATE", "FERRITIN", "TIBC", "IRON", "RETICCTPCT" in the last 72 hours. Urinalysis    Component Value Date/Time   COLORURINE YELLOW 11/22/2022 1929   APPEARANCEUR CLEAR 11/22/2022 1929   LABSPEC >1.030 (H) 11/22/2022 1929   PHURINE 6.0 11/22/2022 1929   GLUCOSEU NEGATIVE 11/22/2022 1929   GLUCOSEU >=1000 (A) 03/16/2019 0915   HGBUR SMALL (A) 11/22/2022 1929   BILIRUBINUR NEGATIVE 11/22/2022 1929   BILIRUBINUR Negative 12/25/2020 1243   KETONESUR NEGATIVE 11/22/2022 1929   PROTEINUR 30 (A) 11/22/2022 1929   UROBILINOGEN 0.2 12/25/2020 1243   UROBILINOGEN 0.2 03/16/2019 0915   NITRITE POSITIVE (A) 11/22/2022 1929   LEUKOCYTESUR TRACE (A) 11/22/2022 1929   Sepsis Labs Recent Labs  Lab 11/25/22 0525 11/26/22 0528 11/27/22 0806 11/28/22 0846  WBC 9.5 9.9 8.9 8.7   Microbiology Recent Results (from the past 240 hour(s))  Urine Culture     Status: Abnormal   Collection Time: 11/22/22  7:29 PM   Specimen: Urine, Random  Result Value Ref Range Status   Specimen Description URINE, RANDOM  Final   Special Requests ADDED 0126 11/23/2022  Final   Culture (A)  Final    80,000 COLONIES/mL KLEBSIELLA PNEUMONIAE >=100,000 COLONIES/mL AEROCOCCUS SPECIES Standardized susceptibility testing for this organism is not available. Performed at [facility], [address], [place], [place] [address]    Report Status 11/25/2022 FINAL  Final   Organism ID, Bacteria KLEBSIELLA PNEUMONIAE (A)  Final      Susceptibility   Klebsiella pneumoniae - MIC*    AMPICILLIN RESISTANT Resistant     CEFAZOLIN <=4 SENSITIVE Sensitive     CEFEPIME <=0.12 SENSITIVE Sensitive     CEFTRIAXONE <=0.25 SENSITIVE Sensitive     CIPROFLOXACIN >=4 RESISTANT Resistant     GENTAMICIN <=1 SENSITIVE Sensitive     IMIPENEM <=0.25 SENSITIVE Sensitive     NITROFURANTOIN 64 INTERMEDIATE Intermediate     TRIMETH/SULFA >=320 RESISTANT Resistant     AMPICILLIN/SULBACTAM 4 SENSITIVE Sensitive     PIP/TAZO  <=4 SENSITIVE Sensitive ug/mL    * 80,000 COLONIES/mL KLEBSIELLA PNEUMONIAE  Culture, blood (routine x 2)     Status: None   Collection Time: 11/22/22  8:25 PM   Specimen: BLOOD RIGHT FOREARM  Result Value Ref Range Status   Specimen Description BLOOD RIGHT FOREARM  Final   Special Requests   Final    BOTTLES DRAWN AEROBIC AND ANAEROBIC Blood Culture results may not be optimal due to an excessive volume of blood received in culture bottles   Culture   Final    NO GROWTH 5 DAYS Performed at [facility], [address], [place], [place] [address]    Report Status 11/27/2022 FINAL  Final  Culture, blood (routine x 2)     Status: None   Collection Time: 11/22/22  8:30 PM   Specimen: BLOOD LEFT FOREARM  Result Value Ref Range Status   Specimen Description BLOOD LEFT FOREARM  Final   Special Requests   Final    BOTTLES DRAWN AEROBIC AND ANAEROBIC Blood Culture results may not be optimal due to an excessive volume of blood received in culture bottles   Culture   Final    NO GROWTH 5 DAYS Performed at [facility], [address] [place], [place] [address]  Report Status 11/27/2022 FINAL  Final  SARS Coronavirus 2 by RT PCR (hospital order, performed in [facility] hospital lab) *cepheid single result test* Anterior Nasal Swab     Status: None   Collection Time: 11/22/22  8:47 PM   Specimen: Anterior Nasal Swab  Result Value Ref Range Status   SARS Coronavirus 2 by RT PCR NEGATIVE NEGATIVE Final    Comment: Performed at [facility] Lab, [address], [place], [place] [address]     Time coordinating discharge:  I have spent 35 minutes face to face with the patient and on the ward discussing the patients care, assessment, plan and disposition with other care givers. >50% of the time was devoted counseling the patient about the risks and benefits of treatment/Discharge disposition and coordinating care.   SIGNED:   Miguel Rota, MD  [facility] 11/28/2022,  9:47 AM   If 7PM-7AM, please contact night-coverage

## 2022-11-28 NOTE — Care Management Important Message (Signed)
Important Message  Patient Details  Name: Jaclyn [name] MRN: [id number] Date of Birth: 11-28-1935   Important Message Given:  Yes - Medicare IM Patient has a contact precaution order in place spoke with the patient granddaughter who ask that I send a copy to her home address.    Cinsere Mizrahi 11/28/2022, 2:15 PM

## 2022-11-28 NOTE — Progress Notes (Signed)
Attempted to call report to [employer]. No answer.

## 2022-11-28 NOTE — TOC Transition Note (Signed)
Transition of Care [facility]) - CM/SW Discharge Note   Patient Details  Name: Jaclyn [name] MRN: [id number] Date of Birth: 10-29-1935  [facility] CM/SW Contact:  Jesseca Marsch A [place], LCSWA Phone Number: 11/28/2022, 10:44 AM   Clinical Narrative:     Patient will DC to: [facility]  Anticipated DC date: 11/28/22  Family notified: Renella Cunas  Transport by: Sharin Mons      Per MD patient ready for DC to [name] . RN, patient, patient's family, and facility notified of DC. Discharge Summary and FL2 sent to facility. RN to call report prior to discharge ( Room 212, [phone] ). DC packet on chart. Ambulance transport requested for patient.     CSW will sign off for now as social work intervention is no longer needed. Please consult [name] again if new needs arise.   Final next level of care: Skilled Nursing Facility Barriers to Discharge: Barriers Resolved   Patient Goals and CMS Choice      Discharge Placement                Patient chooses bed at: [facility] Patient to be transferred to facility by: [facility] Name of family member notified: Casandra Dallaire Patient and family notified of of transfer: 11/28/22  Discharge Plan and Services Additional resources added to the After Visit Summary for                                       Social Determinants of Health (SDOH) Interventions SDOH Screenings   Food Insecurity: No Food Insecurity (11/23/2022)  Housing: Low Risk  (11/23/2022)  Transportation Needs: No Transportation Needs (11/23/2022)  Utilities: Not At Risk (11/23/2022)  Depression (PHQ2-9): Medium Risk (09/10/2018)  Financial Resource Strain: High Risk (02/06/2021)  Tobacco Use: Low Risk  (11/22/2022)     Readmission Risk Interventions     No data to display

## 2022-11-28 NOTE — Care Management Important Message (Signed)
Important Message  Patient Details  Name: Jaclyn [name] MRN: [id number] Date of Birth: [birth year]   Important Message Given:  Yes - Medicare IM  Patient left prior to IM delivery will mail a copy for the IM to the patient home address.     Bedie Dominey 11/28/2022, 2:09 PM

## 2022-11-28 NOTE — Plan of Care (Signed)
  Problem: Fluid Volume: Goal: Hemodynamic stability will improve Outcome: Progressing   Problem: Clinical Measurements: Goal: Diagnostic test results will improve Outcome: Progressing Goal: Signs and symptoms of infection will decrease Outcome: Progressing   Problem: Respiratory: Goal: Ability to maintain adequate ventilation will improve Outcome: Progressing   Problem: Education: Goal: Knowledge of disease or condition will improve Outcome: Progressing Goal: Knowledge of secondary prevention will improve (MUST DOCUMENT ALL) Outcome: Progressing Goal: Knowledge of patient specific risk factors will improve Elta [place] N/A or DELETE if not current risk factor) Outcome: Progressing   Problem: Ischemic Stroke/TIA Tissue Perfusion: Goal: Complications of ischemic stroke/TIA will be minimized Outcome: Progressing   Problem: Coping: Goal: Will verbalize positive feelings about self Outcome: Progressing Goal: Will identify appropriate support needs Outcome: Progressing   Problem: Health Behavior/Discharge Planning: Goal: Ability to manage health-related needs will improve Outcome: Progressing Goal: Goals will be collaboratively established with patient/family Outcome: Progressing   Problem: Self-Care: Goal: Ability to participate in self-care as condition permits will improve Outcome: Progressing Goal: Verbalization of feelings and concerns over difficulty with self-care will improve Outcome: Progressing Goal: Ability to communicate needs accurately will improve Outcome: Progressing   Problem: Nutrition: Goal: Risk of aspiration will decrease Outcome: Progressing Goal: Dietary intake will improve Outcome: Progressing   Problem: Education: Goal: Ability to describe self-care measures that may prevent or decrease complications (Diabetes Survival Skills Education) will improve Outcome: Progressing Goal: Individualized Educational Video(s) Outcome: Progressing   Problem:  Coping: Goal: Ability to adjust to condition or change in health will improve Outcome: Progressing   Problem: Fluid Volume: Goal: Ability to maintain a balanced intake and output will improve Outcome: Progressing   Problem: Health Behavior/Discharge Planning: Goal: Ability to identify and utilize available resources and services will improve Outcome: Progressing Goal: Ability to manage health-related needs will improve Outcome: Progressing   Problem: Metabolic: Goal: Ability to maintain appropriate glucose levels will improve Outcome: Progressing   Problem: Nutritional: Goal: Maintenance of adequate nutrition will improve Outcome: Progressing Goal: Progress toward achieving an optimal weight will improve Outcome: Progressing   Problem: Skin Integrity: Goal: Risk for impaired skin integrity will decrease Outcome: Progressing   Problem: Tissue Perfusion: Goal: Adequacy of tissue perfusion will improve Outcome: Progressing   Problem: Education: Goal: Knowledge of General Education information will improve Description: Including pain rating scale, medication(s)/side effects and non-pharmacologic comfort measures Outcome: Progressing   Problem: Health Behavior/Discharge Planning: Goal: Ability to manage health-related needs will improve Outcome: Progressing   Problem: Clinical Measurements: Goal: Ability to maintain clinical measurements within normal limits will improve Outcome: Progressing Goal: Will remain free from infection Outcome: Progressing Goal: Diagnostic test results will improve Outcome: Progressing Goal: Respiratory complications will improve Outcome: Progressing Goal: Cardiovascular complication will be avoided Outcome: Progressing   Problem: Activity: Goal: Risk for activity intolerance will decrease Outcome: Progressing   Problem: Nutrition: Goal: Adequate nutrition will be maintained Outcome: Progressing   Problem: Coping: Goal: Level of  anxiety will decrease Outcome: Progressing   Problem: Elimination: Goal: Will not experience complications related to bowel motility Outcome: Progressing Goal: Will not experience complications related to urinary retention Outcome: Progressing   Problem: Pain Managment: Goal: General experience of comfort will improve Outcome: Progressing   Problem: Safety: Goal: Ability to remain free from injury will improve Outcome: Progressing   Problem: Skin Integrity: Goal: Risk for impaired skin integrity will decrease Outcome: Progressing

## 2022-12-03 DIAGNOSIS — M6281 Muscle weakness (generalized): Secondary | ICD-10-CM | POA: Diagnosis not present

## 2022-12-03 DIAGNOSIS — I4891 Unspecified atrial fibrillation: Secondary | ICD-10-CM | POA: Diagnosis not present

## 2022-12-03 DIAGNOSIS — E039 Hypothyroidism, unspecified: Secondary | ICD-10-CM | POA: Diagnosis not present

## 2022-12-03 DIAGNOSIS — N3010 Interstitial cystitis (chronic) without hematuria: Secondary | ICD-10-CM | POA: Diagnosis not present

## 2022-12-03 DIAGNOSIS — E118 Type 2 diabetes mellitus with unspecified complications: Secondary | ICD-10-CM | POA: Diagnosis not present

## 2022-12-03 DIAGNOSIS — F0390 Unspecified dementia without behavioral disturbance: Secondary | ICD-10-CM | POA: Diagnosis not present

## 2022-12-03 DIAGNOSIS — F329 Major depressive disorder, single episode, unspecified: Secondary | ICD-10-CM | POA: Diagnosis not present

## 2022-12-03 DIAGNOSIS — R2681 Unsteadiness on feet: Secondary | ICD-10-CM | POA: Diagnosis not present

## 2022-12-03 DIAGNOSIS — R1311 Dysphagia, oral phase: Secondary | ICD-10-CM | POA: Diagnosis not present

## 2022-12-03 DIAGNOSIS — I69352 Hemiplegia and hemiparesis following cerebral infarction affecting left dominant side: Secondary | ICD-10-CM | POA: Diagnosis not present

## 2022-12-03 DIAGNOSIS — I499 Cardiac arrhythmia, unspecified: Secondary | ICD-10-CM | POA: Diagnosis not present

## 2022-12-03 DIAGNOSIS — R2689 Other abnormalities of gait and mobility: Secondary | ICD-10-CM | POA: Diagnosis not present

## 2022-12-03 DIAGNOSIS — R531 Weakness: Secondary | ICD-10-CM | POA: Diagnosis not present

## 2022-12-04 DIAGNOSIS — R1311 Dysphagia, oral phase: Secondary | ICD-10-CM | POA: Diagnosis not present

## 2022-12-04 DIAGNOSIS — I499 Cardiac arrhythmia, unspecified: Secondary | ICD-10-CM | POA: Diagnosis not present

## 2022-12-04 DIAGNOSIS — E039 Hypothyroidism, unspecified: Secondary | ICD-10-CM | POA: Diagnosis not present

## 2022-12-04 DIAGNOSIS — I69352 Hemiplegia and hemiparesis following cerebral infarction affecting left dominant side: Secondary | ICD-10-CM | POA: Diagnosis not present

## 2022-12-04 DIAGNOSIS — R2681 Unsteadiness on feet: Secondary | ICD-10-CM | POA: Diagnosis not present

## 2022-12-04 DIAGNOSIS — R2689 Other abnormalities of gait and mobility: Secondary | ICD-10-CM | POA: Diagnosis not present

## 2022-12-04 DIAGNOSIS — M6281 Muscle weakness (generalized): Secondary | ICD-10-CM | POA: Diagnosis not present

## 2022-12-04 DIAGNOSIS — N3010 Interstitial cystitis (chronic) without hematuria: Secondary | ICD-10-CM | POA: Diagnosis not present

## 2022-12-04 DIAGNOSIS — R531 Weakness: Secondary | ICD-10-CM | POA: Diagnosis not present

## 2022-12-04 DIAGNOSIS — F0390 Unspecified dementia without behavioral disturbance: Secondary | ICD-10-CM | POA: Diagnosis not present

## 2022-12-05 DIAGNOSIS — M6281 Muscle weakness (generalized): Secondary | ICD-10-CM | POA: Diagnosis not present

## 2022-12-05 DIAGNOSIS — R1311 Dysphagia, oral phase: Secondary | ICD-10-CM | POA: Diagnosis not present

## 2022-12-05 DIAGNOSIS — R2689 Other abnormalities of gait and mobility: Secondary | ICD-10-CM | POA: Diagnosis not present

## 2022-12-05 DIAGNOSIS — I69352 Hemiplegia and hemiparesis following cerebral infarction affecting left dominant side: Secondary | ICD-10-CM | POA: Diagnosis not present

## 2022-12-05 DIAGNOSIS — I499 Cardiac arrhythmia, unspecified: Secondary | ICD-10-CM | POA: Diagnosis not present

## 2022-12-05 DIAGNOSIS — E039 Hypothyroidism, unspecified: Secondary | ICD-10-CM | POA: Diagnosis not present

## 2022-12-05 DIAGNOSIS — R2681 Unsteadiness on feet: Secondary | ICD-10-CM | POA: Diagnosis not present

## 2022-12-05 DIAGNOSIS — R531 Weakness: Secondary | ICD-10-CM | POA: Diagnosis not present

## 2022-12-05 DIAGNOSIS — F0390 Unspecified dementia without behavioral disturbance: Secondary | ICD-10-CM | POA: Diagnosis not present

## 2022-12-05 DIAGNOSIS — N3010 Interstitial cystitis (chronic) without hematuria: Secondary | ICD-10-CM | POA: Diagnosis not present

## 2022-12-08 DIAGNOSIS — R2681 Unsteadiness on feet: Secondary | ICD-10-CM | POA: Diagnosis not present

## 2022-12-08 DIAGNOSIS — I499 Cardiac arrhythmia, unspecified: Secondary | ICD-10-CM | POA: Diagnosis not present

## 2022-12-08 DIAGNOSIS — M6281 Muscle weakness (generalized): Secondary | ICD-10-CM | POA: Diagnosis not present

## 2022-12-08 DIAGNOSIS — R2689 Other abnormalities of gait and mobility: Secondary | ICD-10-CM | POA: Diagnosis not present

## 2022-12-08 DIAGNOSIS — R531 Weakness: Secondary | ICD-10-CM | POA: Diagnosis not present

## 2022-12-08 DIAGNOSIS — E039 Hypothyroidism, unspecified: Secondary | ICD-10-CM | POA: Diagnosis not present

## 2022-12-08 DIAGNOSIS — N3010 Interstitial cystitis (chronic) without hematuria: Secondary | ICD-10-CM | POA: Diagnosis not present

## 2022-12-08 DIAGNOSIS — R1311 Dysphagia, oral phase: Secondary | ICD-10-CM | POA: Diagnosis not present

## 2022-12-08 DIAGNOSIS — F0390 Unspecified dementia without behavioral disturbance: Secondary | ICD-10-CM | POA: Diagnosis not present

## 2022-12-08 DIAGNOSIS — I69352 Hemiplegia and hemiparesis following cerebral infarction affecting left dominant side: Secondary | ICD-10-CM | POA: Diagnosis not present

## 2022-12-09 DIAGNOSIS — F0390 Unspecified dementia without behavioral disturbance: Secondary | ICD-10-CM | POA: Diagnosis not present

## 2022-12-09 DIAGNOSIS — R2689 Other abnormalities of gait and mobility: Secondary | ICD-10-CM | POA: Diagnosis not present

## 2022-12-09 DIAGNOSIS — N3010 Interstitial cystitis (chronic) without hematuria: Secondary | ICD-10-CM | POA: Diagnosis not present

## 2022-12-09 DIAGNOSIS — M6281 Muscle weakness (generalized): Secondary | ICD-10-CM | POA: Diagnosis not present

## 2022-12-09 DIAGNOSIS — R531 Weakness: Secondary | ICD-10-CM | POA: Diagnosis not present

## 2022-12-09 DIAGNOSIS — R2681 Unsteadiness on feet: Secondary | ICD-10-CM | POA: Diagnosis not present

## 2022-12-09 DIAGNOSIS — R1311 Dysphagia, oral phase: Secondary | ICD-10-CM | POA: Diagnosis not present

## 2022-12-09 DIAGNOSIS — E039 Hypothyroidism, unspecified: Secondary | ICD-10-CM | POA: Diagnosis not present

## 2022-12-09 DIAGNOSIS — I499 Cardiac arrhythmia, unspecified: Secondary | ICD-10-CM | POA: Diagnosis not present

## 2022-12-09 DIAGNOSIS — I69352 Hemiplegia and hemiparesis following cerebral infarction affecting left dominant side: Secondary | ICD-10-CM | POA: Diagnosis not present

## 2022-12-10 DIAGNOSIS — M6281 Muscle weakness (generalized): Secondary | ICD-10-CM | POA: Diagnosis not present

## 2022-12-10 DIAGNOSIS — E039 Hypothyroidism, unspecified: Secondary | ICD-10-CM | POA: Diagnosis not present

## 2022-12-10 DIAGNOSIS — R2689 Other abnormalities of gait and mobility: Secondary | ICD-10-CM | POA: Diagnosis not present

## 2022-12-10 DIAGNOSIS — I499 Cardiac arrhythmia, unspecified: Secondary | ICD-10-CM | POA: Diagnosis not present

## 2022-12-10 DIAGNOSIS — N3010 Interstitial cystitis (chronic) without hematuria: Secondary | ICD-10-CM | POA: Diagnosis not present

## 2022-12-10 DIAGNOSIS — R2681 Unsteadiness on feet: Secondary | ICD-10-CM | POA: Diagnosis not present

## 2022-12-10 DIAGNOSIS — R1311 Dysphagia, oral phase: Secondary | ICD-10-CM | POA: Diagnosis not present

## 2022-12-10 DIAGNOSIS — F0390 Unspecified dementia without behavioral disturbance: Secondary | ICD-10-CM | POA: Diagnosis not present

## 2022-12-10 DIAGNOSIS — R531 Weakness: Secondary | ICD-10-CM | POA: Diagnosis not present

## 2022-12-10 DIAGNOSIS — I69352 Hemiplegia and hemiparesis following cerebral infarction affecting left dominant side: Secondary | ICD-10-CM | POA: Diagnosis not present

## 2022-12-11 DIAGNOSIS — R531 Weakness: Secondary | ICD-10-CM | POA: Diagnosis not present

## 2022-12-11 DIAGNOSIS — F0390 Unspecified dementia without behavioral disturbance: Secondary | ICD-10-CM | POA: Diagnosis not present

## 2022-12-11 DIAGNOSIS — R2689 Other abnormalities of gait and mobility: Secondary | ICD-10-CM | POA: Diagnosis not present

## 2022-12-11 DIAGNOSIS — R1311 Dysphagia, oral phase: Secondary | ICD-10-CM | POA: Diagnosis not present

## 2022-12-11 DIAGNOSIS — I69352 Hemiplegia and hemiparesis following cerebral infarction affecting left dominant side: Secondary | ICD-10-CM | POA: Diagnosis not present

## 2022-12-11 DIAGNOSIS — E039 Hypothyroidism, unspecified: Secondary | ICD-10-CM | POA: Diagnosis not present

## 2022-12-11 DIAGNOSIS — M6281 Muscle weakness (generalized): Secondary | ICD-10-CM | POA: Diagnosis not present

## 2022-12-11 DIAGNOSIS — I499 Cardiac arrhythmia, unspecified: Secondary | ICD-10-CM | POA: Diagnosis not present

## 2022-12-11 DIAGNOSIS — R2681 Unsteadiness on feet: Secondary | ICD-10-CM | POA: Diagnosis not present

## 2022-12-11 DIAGNOSIS — N3010 Interstitial cystitis (chronic) without hematuria: Secondary | ICD-10-CM | POA: Diagnosis not present

## 2022-12-12 DIAGNOSIS — I499 Cardiac arrhythmia, unspecified: Secondary | ICD-10-CM | POA: Diagnosis not present

## 2022-12-12 DIAGNOSIS — R2689 Other abnormalities of gait and mobility: Secondary | ICD-10-CM | POA: Diagnosis not present

## 2022-12-12 DIAGNOSIS — N3010 Interstitial cystitis (chronic) without hematuria: Secondary | ICD-10-CM | POA: Diagnosis not present

## 2022-12-12 DIAGNOSIS — R531 Weakness: Secondary | ICD-10-CM | POA: Diagnosis not present

## 2022-12-12 DIAGNOSIS — R1311 Dysphagia, oral phase: Secondary | ICD-10-CM | POA: Diagnosis not present

## 2022-12-12 DIAGNOSIS — F0390 Unspecified dementia without behavioral disturbance: Secondary | ICD-10-CM | POA: Diagnosis not present

## 2022-12-12 DIAGNOSIS — E039 Hypothyroidism, unspecified: Secondary | ICD-10-CM | POA: Diagnosis not present

## 2022-12-12 DIAGNOSIS — M6281 Muscle weakness (generalized): Secondary | ICD-10-CM | POA: Diagnosis not present

## 2022-12-12 DIAGNOSIS — R2681 Unsteadiness on feet: Secondary | ICD-10-CM | POA: Diagnosis not present

## 2022-12-12 DIAGNOSIS — I69352 Hemiplegia and hemiparesis following cerebral infarction affecting left dominant side: Secondary | ICD-10-CM | POA: Diagnosis not present

## 2022-12-15 DIAGNOSIS — M6281 Muscle weakness (generalized): Secondary | ICD-10-CM | POA: Diagnosis not present

## 2022-12-15 DIAGNOSIS — B961 Klebsiella pneumoniae [K. pneumoniae] as the cause of diseases classified elsewhere: Secondary | ICD-10-CM | POA: Diagnosis not present

## 2022-12-15 DIAGNOSIS — I499 Cardiac arrhythmia, unspecified: Secondary | ICD-10-CM | POA: Diagnosis not present

## 2022-12-15 DIAGNOSIS — N3010 Interstitial cystitis (chronic) without hematuria: Secondary | ICD-10-CM | POA: Diagnosis not present

## 2022-12-15 DIAGNOSIS — R2681 Unsteadiness on feet: Secondary | ICD-10-CM | POA: Diagnosis not present

## 2022-12-15 DIAGNOSIS — R1311 Dysphagia, oral phase: Secondary | ICD-10-CM | POA: Diagnosis not present

## 2022-12-15 DIAGNOSIS — E861 Hypovolemia: Secondary | ICD-10-CM | POA: Diagnosis not present

## 2022-12-15 DIAGNOSIS — F0390 Unspecified dementia without behavioral disturbance: Secondary | ICD-10-CM | POA: Diagnosis not present

## 2022-12-15 DIAGNOSIS — I69352 Hemiplegia and hemiparesis following cerebral infarction affecting left dominant side: Secondary | ICD-10-CM | POA: Diagnosis not present

## 2022-12-15 DIAGNOSIS — G9340 Encephalopathy, unspecified: Secondary | ICD-10-CM | POA: Diagnosis not present

## 2022-12-15 DIAGNOSIS — R2689 Other abnormalities of gait and mobility: Secondary | ICD-10-CM | POA: Diagnosis not present

## 2022-12-15 DIAGNOSIS — E039 Hypothyroidism, unspecified: Secondary | ICD-10-CM | POA: Diagnosis not present

## 2022-12-15 DIAGNOSIS — R531 Weakness: Secondary | ICD-10-CM | POA: Diagnosis not present

## 2022-12-16 DIAGNOSIS — R2689 Other abnormalities of gait and mobility: Secondary | ICD-10-CM | POA: Diagnosis not present

## 2022-12-16 DIAGNOSIS — R2681 Unsteadiness on feet: Secondary | ICD-10-CM | POA: Diagnosis not present

## 2022-12-16 DIAGNOSIS — F0390 Unspecified dementia without behavioral disturbance: Secondary | ICD-10-CM | POA: Diagnosis not present

## 2022-12-16 DIAGNOSIS — M6281 Muscle weakness (generalized): Secondary | ICD-10-CM | POA: Diagnosis not present

## 2022-12-16 DIAGNOSIS — I499 Cardiac arrhythmia, unspecified: Secondary | ICD-10-CM | POA: Diagnosis not present

## 2022-12-16 DIAGNOSIS — R1311 Dysphagia, oral phase: Secondary | ICD-10-CM | POA: Diagnosis not present

## 2022-12-16 DIAGNOSIS — I69352 Hemiplegia and hemiparesis following cerebral infarction affecting left dominant side: Secondary | ICD-10-CM | POA: Diagnosis not present

## 2022-12-16 DIAGNOSIS — N3010 Interstitial cystitis (chronic) without hematuria: Secondary | ICD-10-CM | POA: Diagnosis not present

## 2022-12-16 DIAGNOSIS — E039 Hypothyroidism, unspecified: Secondary | ICD-10-CM | POA: Diagnosis not present

## 2022-12-16 DIAGNOSIS — R531 Weakness: Secondary | ICD-10-CM | POA: Diagnosis not present

## 2022-12-17 DIAGNOSIS — R531 Weakness: Secondary | ICD-10-CM | POA: Diagnosis not present

## 2022-12-17 DIAGNOSIS — I499 Cardiac arrhythmia, unspecified: Secondary | ICD-10-CM | POA: Diagnosis not present

## 2022-12-17 DIAGNOSIS — F0390 Unspecified dementia without behavioral disturbance: Secondary | ICD-10-CM | POA: Diagnosis not present

## 2022-12-17 DIAGNOSIS — R1311 Dysphagia, oral phase: Secondary | ICD-10-CM | POA: Diagnosis not present

## 2022-12-17 DIAGNOSIS — I69352 Hemiplegia and hemiparesis following cerebral infarction affecting left dominant side: Secondary | ICD-10-CM | POA: Diagnosis not present

## 2022-12-17 DIAGNOSIS — R2681 Unsteadiness on feet: Secondary | ICD-10-CM | POA: Diagnosis not present

## 2022-12-17 DIAGNOSIS — N3010 Interstitial cystitis (chronic) without hematuria: Secondary | ICD-10-CM | POA: Diagnosis not present

## 2022-12-17 DIAGNOSIS — M6281 Muscle weakness (generalized): Secondary | ICD-10-CM | POA: Diagnosis not present

## 2022-12-17 DIAGNOSIS — E039 Hypothyroidism, unspecified: Secondary | ICD-10-CM | POA: Diagnosis not present

## 2022-12-17 DIAGNOSIS — R2689 Other abnormalities of gait and mobility: Secondary | ICD-10-CM | POA: Diagnosis not present

## 2022-12-18 DIAGNOSIS — F0390 Unspecified dementia without behavioral disturbance: Secondary | ICD-10-CM | POA: Diagnosis not present

## 2022-12-18 DIAGNOSIS — R2681 Unsteadiness on feet: Secondary | ICD-10-CM | POA: Diagnosis not present

## 2022-12-18 DIAGNOSIS — R2689 Other abnormalities of gait and mobility: Secondary | ICD-10-CM | POA: Diagnosis not present

## 2022-12-18 DIAGNOSIS — M6281 Muscle weakness (generalized): Secondary | ICD-10-CM | POA: Diagnosis not present

## 2022-12-18 DIAGNOSIS — I499 Cardiac arrhythmia, unspecified: Secondary | ICD-10-CM | POA: Diagnosis not present

## 2022-12-18 DIAGNOSIS — N3010 Interstitial cystitis (chronic) without hematuria: Secondary | ICD-10-CM | POA: Diagnosis not present

## 2022-12-18 DIAGNOSIS — E039 Hypothyroidism, unspecified: Secondary | ICD-10-CM | POA: Diagnosis not present

## 2022-12-18 DIAGNOSIS — R531 Weakness: Secondary | ICD-10-CM | POA: Diagnosis not present

## 2022-12-18 DIAGNOSIS — I69352 Hemiplegia and hemiparesis following cerebral infarction affecting left dominant side: Secondary | ICD-10-CM | POA: Diagnosis not present

## 2022-12-18 DIAGNOSIS — R1311 Dysphagia, oral phase: Secondary | ICD-10-CM | POA: Diagnosis not present

## 2022-12-19 DIAGNOSIS — I69352 Hemiplegia and hemiparesis following cerebral infarction affecting left dominant side: Secondary | ICD-10-CM | POA: Diagnosis not present

## 2022-12-19 DIAGNOSIS — R1311 Dysphagia, oral phase: Secondary | ICD-10-CM | POA: Diagnosis not present

## 2022-12-19 DIAGNOSIS — I499 Cardiac arrhythmia, unspecified: Secondary | ICD-10-CM | POA: Diagnosis not present

## 2022-12-19 DIAGNOSIS — N3010 Interstitial cystitis (chronic) without hematuria: Secondary | ICD-10-CM | POA: Diagnosis not present

## 2022-12-19 DIAGNOSIS — M6281 Muscle weakness (generalized): Secondary | ICD-10-CM | POA: Diagnosis not present

## 2022-12-19 DIAGNOSIS — E039 Hypothyroidism, unspecified: Secondary | ICD-10-CM | POA: Diagnosis not present

## 2022-12-23 DIAGNOSIS — E119 Type 2 diabetes mellitus without complications: Secondary | ICD-10-CM | POA: Diagnosis not present

## 2022-12-24 DIAGNOSIS — G9340 Encephalopathy, unspecified: Secondary | ICD-10-CM | POA: Diagnosis not present

## 2022-12-24 DIAGNOSIS — E861 Hypovolemia: Secondary | ICD-10-CM | POA: Diagnosis not present

## 2022-12-24 DIAGNOSIS — E118 Type 2 diabetes mellitus with unspecified complications: Secondary | ICD-10-CM | POA: Diagnosis not present

## 2023-01-07 DIAGNOSIS — E039 Hypothyroidism, unspecified: Secondary | ICD-10-CM | POA: Diagnosis not present

## 2023-01-23 DIAGNOSIS — E039 Hypothyroidism, unspecified: Secondary | ICD-10-CM | POA: Diagnosis not present

## 2023-01-27 DIAGNOSIS — I4891 Unspecified atrial fibrillation: Secondary | ICD-10-CM | POA: Diagnosis not present

## 2023-01-27 DIAGNOSIS — F0390 Unspecified dementia without behavioral disturbance: Secondary | ICD-10-CM | POA: Diagnosis not present

## 2023-01-27 DIAGNOSIS — E039 Hypothyroidism, unspecified: Secondary | ICD-10-CM | POA: Diagnosis not present

## 2023-02-24 DIAGNOSIS — I10 Essential (primary) hypertension: Secondary | ICD-10-CM | POA: Diagnosis not present

## 2023-02-25 DIAGNOSIS — E039 Hypothyroidism, unspecified: Secondary | ICD-10-CM | POA: Diagnosis not present

## 2023-03-02 DIAGNOSIS — R2681 Unsteadiness on feet: Secondary | ICD-10-CM | POA: Diagnosis not present

## 2023-03-02 DIAGNOSIS — I69352 Hemiplegia and hemiparesis following cerebral infarction affecting left dominant side: Secondary | ICD-10-CM | POA: Diagnosis not present

## 2023-03-02 DIAGNOSIS — R2689 Other abnormalities of gait and mobility: Secondary | ICD-10-CM | POA: Diagnosis not present

## 2023-03-02 DIAGNOSIS — M6281 Muscle weakness (generalized): Secondary | ICD-10-CM | POA: Diagnosis not present

## 2023-03-03 DIAGNOSIS — M6281 Muscle weakness (generalized): Secondary | ICD-10-CM | POA: Diagnosis not present

## 2023-03-03 DIAGNOSIS — R2681 Unsteadiness on feet: Secondary | ICD-10-CM | POA: Diagnosis not present

## 2023-03-03 DIAGNOSIS — R2689 Other abnormalities of gait and mobility: Secondary | ICD-10-CM | POA: Diagnosis not present

## 2023-03-03 DIAGNOSIS — I69352 Hemiplegia and hemiparesis following cerebral infarction affecting left dominant side: Secondary | ICD-10-CM | POA: Diagnosis not present

## 2023-03-04 DIAGNOSIS — R2689 Other abnormalities of gait and mobility: Secondary | ICD-10-CM | POA: Diagnosis not present

## 2023-03-04 DIAGNOSIS — I69352 Hemiplegia and hemiparesis following cerebral infarction affecting left dominant side: Secondary | ICD-10-CM | POA: Diagnosis not present

## 2023-03-04 DIAGNOSIS — R2681 Unsteadiness on feet: Secondary | ICD-10-CM | POA: Diagnosis not present

## 2023-03-04 DIAGNOSIS — M6281 Muscle weakness (generalized): Secondary | ICD-10-CM | POA: Diagnosis not present

## 2023-03-05 DIAGNOSIS — R2689 Other abnormalities of gait and mobility: Secondary | ICD-10-CM | POA: Diagnosis not present

## 2023-03-05 DIAGNOSIS — M6281 Muscle weakness (generalized): Secondary | ICD-10-CM | POA: Diagnosis not present

## 2023-03-05 DIAGNOSIS — I69352 Hemiplegia and hemiparesis following cerebral infarction affecting left dominant side: Secondary | ICD-10-CM | POA: Diagnosis not present

## 2023-03-05 DIAGNOSIS — R2681 Unsteadiness on feet: Secondary | ICD-10-CM | POA: Diagnosis not present

## 2023-03-09 DIAGNOSIS — R2681 Unsteadiness on feet: Secondary | ICD-10-CM | POA: Diagnosis not present

## 2023-03-09 DIAGNOSIS — I69352 Hemiplegia and hemiparesis following cerebral infarction affecting left dominant side: Secondary | ICD-10-CM | POA: Diagnosis not present

## 2023-03-09 DIAGNOSIS — R2689 Other abnormalities of gait and mobility: Secondary | ICD-10-CM | POA: Diagnosis not present

## 2023-03-09 DIAGNOSIS — M6281 Muscle weakness (generalized): Secondary | ICD-10-CM | POA: Diagnosis not present

## 2023-03-10 DIAGNOSIS — I69352 Hemiplegia and hemiparesis following cerebral infarction affecting left dominant side: Secondary | ICD-10-CM | POA: Diagnosis not present

## 2023-03-10 DIAGNOSIS — R2689 Other abnormalities of gait and mobility: Secondary | ICD-10-CM | POA: Diagnosis not present

## 2023-03-10 DIAGNOSIS — M6281 Muscle weakness (generalized): Secondary | ICD-10-CM | POA: Diagnosis not present

## 2023-03-10 DIAGNOSIS — R2681 Unsteadiness on feet: Secondary | ICD-10-CM | POA: Diagnosis not present

## 2023-03-11 DIAGNOSIS — R2681 Unsteadiness on feet: Secondary | ICD-10-CM | POA: Diagnosis not present

## 2023-03-11 DIAGNOSIS — R2689 Other abnormalities of gait and mobility: Secondary | ICD-10-CM | POA: Diagnosis not present

## 2023-03-11 DIAGNOSIS — I69352 Hemiplegia and hemiparesis following cerebral infarction affecting left dominant side: Secondary | ICD-10-CM | POA: Diagnosis not present

## 2023-03-11 DIAGNOSIS — M6281 Muscle weakness (generalized): Secondary | ICD-10-CM | POA: Diagnosis not present

## 2023-03-12 DIAGNOSIS — R2689 Other abnormalities of gait and mobility: Secondary | ICD-10-CM | POA: Diagnosis not present

## 2023-03-12 DIAGNOSIS — I10 Essential (primary) hypertension: Secondary | ICD-10-CM | POA: Diagnosis not present

## 2023-03-12 DIAGNOSIS — F0390 Unspecified dementia without behavioral disturbance: Secondary | ICD-10-CM | POA: Diagnosis not present

## 2023-03-12 DIAGNOSIS — E039 Hypothyroidism, unspecified: Secondary | ICD-10-CM | POA: Diagnosis not present

## 2023-03-12 DIAGNOSIS — M6281 Muscle weakness (generalized): Secondary | ICD-10-CM | POA: Diagnosis not present

## 2023-03-12 DIAGNOSIS — I69352 Hemiplegia and hemiparesis following cerebral infarction affecting left dominant side: Secondary | ICD-10-CM | POA: Diagnosis not present

## 2023-03-12 DIAGNOSIS — E119 Type 2 diabetes mellitus without complications: Secondary | ICD-10-CM | POA: Diagnosis not present

## 2023-03-12 DIAGNOSIS — R2681 Unsteadiness on feet: Secondary | ICD-10-CM | POA: Diagnosis not present

## 2023-03-13 DIAGNOSIS — M6281 Muscle weakness (generalized): Secondary | ICD-10-CM | POA: Diagnosis not present

## 2023-03-13 DIAGNOSIS — R2689 Other abnormalities of gait and mobility: Secondary | ICD-10-CM | POA: Diagnosis not present

## 2023-03-13 DIAGNOSIS — R2681 Unsteadiness on feet: Secondary | ICD-10-CM | POA: Diagnosis not present

## 2023-03-13 DIAGNOSIS — I69352 Hemiplegia and hemiparesis following cerebral infarction affecting left dominant side: Secondary | ICD-10-CM | POA: Diagnosis not present

## 2023-03-14 DIAGNOSIS — M6281 Muscle weakness (generalized): Secondary | ICD-10-CM | POA: Diagnosis not present

## 2023-03-14 DIAGNOSIS — R2681 Unsteadiness on feet: Secondary | ICD-10-CM | POA: Diagnosis not present

## 2023-03-14 DIAGNOSIS — R2689 Other abnormalities of gait and mobility: Secondary | ICD-10-CM | POA: Diagnosis not present

## 2023-03-14 DIAGNOSIS — I69352 Hemiplegia and hemiparesis following cerebral infarction affecting left dominant side: Secondary | ICD-10-CM | POA: Diagnosis not present

## 2023-03-16 DIAGNOSIS — R2681 Unsteadiness on feet: Secondary | ICD-10-CM | POA: Diagnosis not present

## 2023-03-16 DIAGNOSIS — I69352 Hemiplegia and hemiparesis following cerebral infarction affecting left dominant side: Secondary | ICD-10-CM | POA: Diagnosis not present

## 2023-03-16 DIAGNOSIS — R2689 Other abnormalities of gait and mobility: Secondary | ICD-10-CM | POA: Diagnosis not present

## 2023-03-16 DIAGNOSIS — M6281 Muscle weakness (generalized): Secondary | ICD-10-CM | POA: Diagnosis not present

## 2023-04-07 ENCOUNTER — Other Ambulatory Visit: Payer: Self-pay

## 2023-04-07 ENCOUNTER — Encounter (HOSPITAL_COMMUNITY): Payer: Self-pay

## 2023-04-07 ENCOUNTER — Emergency Department (HOSPITAL_COMMUNITY)
Admission: EM | Admit: 2023-04-07 | Discharge: 2023-04-07 | Disposition: A | Discharge status: Skilled Nursing Facility | Payer: Medicare Other | Attending: Emergency Medicine | Admitting: Emergency Medicine

## 2023-04-07 DIAGNOSIS — F0390 Unspecified dementia without behavioral disturbance: Secondary | ICD-10-CM | POA: Insufficient documentation

## 2023-04-07 DIAGNOSIS — Z7984 Long term (current) use of oral hypoglycemic drugs: Secondary | ICD-10-CM | POA: Insufficient documentation

## 2023-04-07 DIAGNOSIS — I10 Essential (primary) hypertension: Secondary | ICD-10-CM | POA: Insufficient documentation

## 2023-04-07 DIAGNOSIS — R4182 Altered mental status, unspecified: Secondary | ICD-10-CM | POA: Diagnosis not present

## 2023-04-07 DIAGNOSIS — E119 Type 2 diabetes mellitus without complications: Secondary | ICD-10-CM | POA: Insufficient documentation

## 2023-04-07 DIAGNOSIS — Z86018 Personal history of other benign neoplasm: Secondary | ICD-10-CM | POA: Diagnosis not present

## 2023-04-07 DIAGNOSIS — I959 Hypotension, unspecified: Secondary | ICD-10-CM | POA: Diagnosis not present

## 2023-04-07 DIAGNOSIS — G9340 Encephalopathy, unspecified: Secondary | ICD-10-CM | POA: Diagnosis not present

## 2023-04-07 DIAGNOSIS — Z8673 Personal history of transient ischemic attack (TIA), and cerebral infarction without residual deficits: Secondary | ICD-10-CM | POA: Diagnosis not present

## 2023-04-07 DIAGNOSIS — Z91040 Latex allergy status: Secondary | ICD-10-CM | POA: Insufficient documentation

## 2023-04-07 DIAGNOSIS — R739 Hyperglycemia, unspecified: Secondary | ICD-10-CM | POA: Diagnosis not present

## 2023-04-07 DIAGNOSIS — E860 Dehydration: Secondary | ICD-10-CM | POA: Diagnosis not present

## 2023-04-07 DIAGNOSIS — Z794 Long term (current) use of insulin: Secondary | ICD-10-CM | POA: Diagnosis not present

## 2023-04-07 DIAGNOSIS — Z79899 Other long term (current) drug therapy: Secondary | ICD-10-CM | POA: Diagnosis not present

## 2023-04-07 DIAGNOSIS — R442 Other hallucinations: Secondary | ICD-10-CM | POA: Diagnosis not present

## 2023-04-07 DIAGNOSIS — I4891 Unspecified atrial fibrillation: Secondary | ICD-10-CM | POA: Insufficient documentation

## 2023-04-07 DIAGNOSIS — E039 Hypothyroidism, unspecified: Secondary | ICD-10-CM | POA: Diagnosis not present

## 2023-04-07 HISTORY — DX: Unspecified dementia, unspecified severity, without behavioral disturbance, psychotic disturbance, mood disturbance, and anxiety: F03.90

## 2023-04-07 HISTORY — DX: Delirium due to known physiological condition: F05

## 2023-04-07 HISTORY — DX: Unspecified hearing loss, unspecified ear: H91.90

## 2023-04-07 LAB — CBC WITH DIFFERENTIAL/PLATELET
Abs Immature Granulocytes: 0.04 10*3/uL (ref 0.00–0.07)
Basophils Absolute: 0 10*3/uL (ref 0.0–0.1)
Basophils Relative: 0 %
Eosinophils Absolute: 0.2 10*3/uL (ref 0.0–0.5)
Eosinophils Relative: 2 %
HCT: 35.6 % — ABNORMAL LOW (ref 36.0–46.0)
Hemoglobin: 11.5 g/dL — ABNORMAL LOW (ref 12.0–15.0)
Immature Granulocytes: 0 %
Lymphocytes Relative: 18 %
Lymphs Abs: 1.7 10*3/uL (ref 0.7–4.0)
MCH: 29.3 pg (ref 26.0–34.0)
MCHC: 32.3 g/dL (ref 30.0–36.0)
MCV: 90.6 fL (ref 80.0–100.0)
Monocytes Absolute: 0.7 10*3/uL (ref 0.1–1.0)
Monocytes Relative: 8 %
Neutro Abs: 6.9 10*3/uL (ref 1.7–7.7)
Neutrophils Relative %: 72 %
Platelets: 276 10*3/uL (ref 150–400)
RBC: 3.93 MIL/uL (ref 3.87–5.11)
RDW: 14.7 % (ref 11.5–15.5)
WBC: 9.5 10*3/uL (ref 4.0–10.5)
nRBC: 0 % (ref 0.0–0.2)

## 2023-04-07 LAB — URINALYSIS, W/ REFLEX TO CULTURE (INFECTION SUSPECTED)
Bacteria, UA: NONE SEEN
Bilirubin Urine: NEGATIVE
Glucose, UA: NEGATIVE mg/dL
Hgb urine dipstick: NEGATIVE
Ketones, ur: NEGATIVE mg/dL
Leukocytes,Ua: NEGATIVE
Nitrite: NEGATIVE
Protein, ur: NEGATIVE mg/dL
Specific Gravity, Urine: 1.016 (ref 1.005–1.030)
pH: 5 (ref 5.0–8.0)

## 2023-04-07 LAB — COMPREHENSIVE METABOLIC PANEL
ALT: 14 U/L (ref 0–44)
AST: 20 U/L (ref 15–41)
Albumin: 3.4 g/dL — ABNORMAL LOW (ref 3.5–5.0)
Alkaline Phosphatase: 57 U/L (ref 38–126)
Anion gap: 16 — ABNORMAL HIGH (ref 5–15)
BUN: 11 mg/dL (ref 8–23)
CO2: 22 mmol/L (ref 22–32)
Calcium: 9.4 mg/dL (ref 8.9–10.3)
Chloride: 100 mmol/L (ref 98–111)
Creatinine, Ser: 0.91 mg/dL (ref 0.44–1.00)
GFR, Estimated: >60 mL/min (ref >60)
Glucose, Bld: 267 mg/dL — ABNORMAL HIGH (ref 70–99)
Potassium: 3.8 mmol/L (ref 3.5–5.1)
Sodium: 138 mmol/L (ref 135–145)
Total Bilirubin: 0.6 mg/dL (ref 0.0–1.2)
Total Protein: 7.4 g/dL (ref 6.5–8.1)

## 2023-04-07 LAB — CBG MONITORING, ED: Glucose-Capillary: 233 mg/dL — ABNORMAL HIGH (ref 70–99)

## 2023-04-07 MED ORDER — SODIUM CHLORIDE 0.9 % IV BOLUS
500.0000 mL | Freq: Once | INTRAVENOUS | Status: AC
  Administered 2023-04-07: 500 mL via INTRAVENOUS

## 2023-04-07 NOTE — ED Notes (Signed)
PTAR has arrived to transport pt back to [facility]

## 2023-04-07 NOTE — ED Provider Notes (Signed)
[place] EMERGENCY DEPARTMENT AT [facility] Provider Note   CSN: [id number] Arrival date & time: 04/07/23  1844     History  Chief Complaint  Patient presents with   Altered Mental Status    Jaclyn [name] is a 88 y.o. female.   Altered Mental Status Patient brought in with mental status.  History of UTIs.  States she been more confused and reportedly having some hallucinations.  States she sees an hears things that are not there.  No trauma.  No    Past Medical History:  Diagnosis Date   Atrial fibrillation (HCC)    Benign neoplasm of colon    Carotid artery occlusion    60-79% right ICA stenosis   Dementia (HCC)    Difficult intubation 02/17/2006   During surgery to remove large polyp   Diverticulosis of colon (without mention of hemorrhage)    Dysthymic disorder    Fibromyalgia    Headache(784.0)    HOH (hard of hearing)    Insomnia, unspecified    Interstitial cystitis    Osteoarthrosis, unspecified whether generalized or localized, unspecified site    Other and unspecified hyperlipidemia    Other chest pain    Other specified benign mammary dysplasias    TIA (transient ischemic attack)    Type II or unspecified type diabetes mellitus without mention of complication, not stated as uncontrolled    Unspecified essential hypertension    Unspecified hypothyroidism    Unspecified vitamin D deficiency    Urinary tract infection, site not specified     Home Medications Prior to Admission medications   Medication Sig Start Date End Date Taking? Authorizing Provider  Cholecalciferol (VITAMIN D3) 1.25 MG (50000 UT) CAPS Take 1 capsule by mouth once a week. 12/27/20   Bedsole, Amy E, MD  cyanocobalamin 1000 MCG tablet Take 2,000 mcg by mouth daily.    [provider]  docusate sodium (COLACE) 100 MG capsule Take 100 mg by mouth 2 (two) times daily.    [provider]  DULoxetine (CYMBALTA) 30 MG capsule Take 30 mg by mouth daily.  01/13/22   [provider]  ELIQUIS 2.5 MG TABS tablet Take 2.5 mg by mouth 2 (two) times daily. 11/01/21   [provider]  guaiFENesin (MUCINEX) 600 MG 12 hr tablet Take 600 mg by mouth 2 (two) times daily as needed for cough.    [provider]  HUMALOG KWIKPEN 100 UNIT/ML KwikPen Inject 0-12 Units into the skin in the morning, at noon, in the evening, and at bedtime. Before meals and at bedtime 01/13/22   [provider]  insulin lispro (HUMALOG) 100 UNIT/ML injection Inject 3 Units into the skin 3 (three) times daily before meals.    [provider]  JANUVIA 100 MG tablet TAKE 1 TABLET BY MOUTH EVERY DAY 01/07/21   Bedsole, Amy E, MD  LANTUS SOLOSTAR 100 UNIT/ML Solostar Pen Inject 18 Units into the skin at bedtime. 07/31/22   [provider]  levothyroxine (SYNTHROID) 75 MCG tablet Take 75 mcg by mouth daily. 11/14/22   [provider]  magnesium oxide (MAG-OX) 400 (240 Mg) MG tablet Take 800 mg by mouth 2 (two) times daily.    [provider]  metFORMIN (GLUCOPHAGE) 1000 MG tablet Take 1,000 mg by mouth 2 (two) times daily. 10/28/21   [provider]  Omega-3 Fatty Acids (FISH OIL) 1000 MG CAPS Take 1,000 mg by mouth daily.    [provider]  polyethylene glycol (MIRALAX / GLYCOLAX) 17 g packet Take 17 g by mouth daily as needed for mild constipation or moderate constipation. 11/28/22   Amin, Ankit C, MD      Allergies    Naproxen sodium, Statins, Sulfonamide derivatives, Latex, and Shellfish allergy    Review of Systems   Review of Systems  Physical Exam Updated Vital Signs BP (!) 107/50   Pulse 84   Temp 98 F (36.7 C) (Oral)   Resp 17   Ht 5' 5.5" (1.664 m)   Wt 60 kg   SpO2 98%   BMI 21.68 kg/m  Physical Exam Vitals and nursing note reviewed.  Cardiovascular:     Rate and Rhythm: Regular rhythm.  Pulmonary:     Effort: Pulmonary effort is normal.  Abdominal:     Tenderness: There  is no abdominal tenderness.  Neurological:     Mental Status: She is alert.     ED Results / Procedures / Treatments   Labs (all labs ordered are listed, but only abnormal results are displayed) Labs Reviewed  COMPREHENSIVE METABOLIC PANEL - Abnormal; Notable for the following components:      Result Value   Glucose, Bld 267 (*)    Albumin 3.4 (*)    Anion gap 16 (*)    All other components within normal limits  CBC WITH DIFFERENTIAL/PLATELET - Abnormal; Notable for the following components:   Hemoglobin 11.5 (*)    HCT 35.6 (*)    All other components within normal limits  CBG MONITORING, ED - Abnormal; Notable for the following components:   Glucose-Capillary 233 (*)    All other components within normal limits  URINALYSIS, W/ REFLEX TO CULTURE (INFECTION SUSPECTED)    EKG None  Radiology No results found.  Procedures Procedures    Medications Ordered in ED Medications  sodium chloride 0.9 % bolus 500 mL (500 mLs Intravenous New Bag/Given 04/07/23 2226)    ED Course/ Medical Decision Making/ A&P                                 Medical Decision Making Amount and/or Complexity of Data Reviewed Labs: ordered.   Patient mental status change.  History of same with UTIs.  Differential diagnosis includes cause such as UTI, encephalopathy, dehydration.  Urine does not show infection.  Blood work overall reassuring.  No trauma.  Do not think we need head CT.  Mild his dry.  And some mild hypotension.  Will give fluid bolus but appears stable go back to nursing home.        Final Clinical Impression(s) / ED Diagnoses Final diagnoses:  Dehydration  Encephalopathy, unspecified type    Rx / DC Orders ED Discharge Orders     None         Benjiman Core, MD 04/07/23 2241

## 2023-04-07 NOTE — ED Triage Notes (Addendum)
BIB [facility] from home, lives with son. More confused than normal with visual hallucinations. A&O3 . Patient believes she has UTI. History of chronic UTI . Wheelchair at baseline. Left arm injury. Very hard of hearing. 129/80 87HR 95% RA 251 CBG. Diabetic

## 2023-04-07 NOTE — Discharge Instructions (Addendum)
Your lab work showed he had an elevated glucose at 233.  Please have this followed up by your PCP  Your urinalysis did not show any signs of UTI.  Your blood counts and electrolytes are at your baseline.  Please keep well-hydrated.  Please follow-up with your PCP within the next week for recheck.

## 2023-04-07 NOTE — ED Notes (Addendum)
[facility] called to have pt transported back to [facility].

## 2023-04-10 DIAGNOSIS — E039 Hypothyroidism, unspecified: Secondary | ICD-10-CM | POA: Diagnosis not present

## 2023-04-10 DIAGNOSIS — E46 Unspecified protein-calorie malnutrition: Secondary | ICD-10-CM | POA: Diagnosis not present

## 2023-04-10 DIAGNOSIS — I10 Essential (primary) hypertension: Secondary | ICD-10-CM | POA: Diagnosis not present

## 2023-04-15 DIAGNOSIS — R2681 Unsteadiness on feet: Secondary | ICD-10-CM | POA: Diagnosis not present

## 2023-04-15 DIAGNOSIS — I69352 Hemiplegia and hemiparesis following cerebral infarction affecting left dominant side: Secondary | ICD-10-CM | POA: Diagnosis not present

## 2023-04-15 DIAGNOSIS — I69328 Other speech and language deficits following cerebral infarction: Secondary | ICD-10-CM | POA: Diagnosis not present

## 2023-04-15 DIAGNOSIS — R41841 Cognitive communication deficit: Secondary | ICD-10-CM | POA: Diagnosis not present

## 2023-04-15 DIAGNOSIS — R2689 Other abnormalities of gait and mobility: Secondary | ICD-10-CM | POA: Diagnosis not present

## 2023-04-16 DIAGNOSIS — R2681 Unsteadiness on feet: Secondary | ICD-10-CM | POA: Diagnosis not present

## 2023-04-16 DIAGNOSIS — R2689 Other abnormalities of gait and mobility: Secondary | ICD-10-CM | POA: Diagnosis not present

## 2023-04-16 DIAGNOSIS — I69328 Other speech and language deficits following cerebral infarction: Secondary | ICD-10-CM | POA: Diagnosis not present

## 2023-04-16 DIAGNOSIS — I69352 Hemiplegia and hemiparesis following cerebral infarction affecting left dominant side: Secondary | ICD-10-CM | POA: Diagnosis not present

## 2023-04-16 DIAGNOSIS — R41841 Cognitive communication deficit: Secondary | ICD-10-CM | POA: Diagnosis not present

## 2023-04-17 DIAGNOSIS — R41841 Cognitive communication deficit: Secondary | ICD-10-CM | POA: Diagnosis not present

## 2023-04-17 DIAGNOSIS — R2689 Other abnormalities of gait and mobility: Secondary | ICD-10-CM | POA: Diagnosis not present

## 2023-04-17 DIAGNOSIS — R2681 Unsteadiness on feet: Secondary | ICD-10-CM | POA: Diagnosis not present

## 2023-04-17 DIAGNOSIS — I69328 Other speech and language deficits following cerebral infarction: Secondary | ICD-10-CM | POA: Diagnosis not present

## 2023-04-17 DIAGNOSIS — I69352 Hemiplegia and hemiparesis following cerebral infarction affecting left dominant side: Secondary | ICD-10-CM | POA: Diagnosis not present

## 2023-04-19 DIAGNOSIS — R2681 Unsteadiness on feet: Secondary | ICD-10-CM | POA: Diagnosis not present

## 2023-04-19 DIAGNOSIS — I69352 Hemiplegia and hemiparesis following cerebral infarction affecting left dominant side: Secondary | ICD-10-CM | POA: Diagnosis not present

## 2023-04-19 DIAGNOSIS — I69328 Other speech and language deficits following cerebral infarction: Secondary | ICD-10-CM | POA: Diagnosis not present

## 2023-04-19 DIAGNOSIS — R2689 Other abnormalities of gait and mobility: Secondary | ICD-10-CM | POA: Diagnosis not present

## 2023-04-19 DIAGNOSIS — R41841 Cognitive communication deficit: Secondary | ICD-10-CM | POA: Diagnosis not present

## 2023-04-20 DIAGNOSIS — I69328 Other speech and language deficits following cerebral infarction: Secondary | ICD-10-CM | POA: Diagnosis not present

## 2023-04-20 DIAGNOSIS — I69352 Hemiplegia and hemiparesis following cerebral infarction affecting left dominant side: Secondary | ICD-10-CM | POA: Diagnosis not present

## 2023-04-20 DIAGNOSIS — R2689 Other abnormalities of gait and mobility: Secondary | ICD-10-CM | POA: Diagnosis not present

## 2023-04-20 DIAGNOSIS — R41841 Cognitive communication deficit: Secondary | ICD-10-CM | POA: Diagnosis not present

## 2023-04-20 DIAGNOSIS — R2681 Unsteadiness on feet: Secondary | ICD-10-CM | POA: Diagnosis not present

## 2023-04-21 DIAGNOSIS — R2689 Other abnormalities of gait and mobility: Secondary | ICD-10-CM | POA: Diagnosis not present

## 2023-04-21 DIAGNOSIS — R41841 Cognitive communication deficit: Secondary | ICD-10-CM | POA: Diagnosis not present

## 2023-04-21 DIAGNOSIS — I69328 Other speech and language deficits following cerebral infarction: Secondary | ICD-10-CM | POA: Diagnosis not present

## 2023-04-21 DIAGNOSIS — I69352 Hemiplegia and hemiparesis following cerebral infarction affecting left dominant side: Secondary | ICD-10-CM | POA: Diagnosis not present

## 2023-04-21 DIAGNOSIS — R2681 Unsteadiness on feet: Secondary | ICD-10-CM | POA: Diagnosis not present

## 2023-04-23 DIAGNOSIS — R41841 Cognitive communication deficit: Secondary | ICD-10-CM | POA: Diagnosis not present

## 2023-04-23 DIAGNOSIS — R2689 Other abnormalities of gait and mobility: Secondary | ICD-10-CM | POA: Diagnosis not present

## 2023-04-23 DIAGNOSIS — R2681 Unsteadiness on feet: Secondary | ICD-10-CM | POA: Diagnosis not present

## 2023-04-23 DIAGNOSIS — I69328 Other speech and language deficits following cerebral infarction: Secondary | ICD-10-CM | POA: Diagnosis not present

## 2023-04-23 DIAGNOSIS — I69352 Hemiplegia and hemiparesis following cerebral infarction affecting left dominant side: Secondary | ICD-10-CM | POA: Diagnosis not present

## 2023-04-25 DIAGNOSIS — I69328 Other speech and language deficits following cerebral infarction: Secondary | ICD-10-CM | POA: Diagnosis not present

## 2023-04-25 DIAGNOSIS — R2681 Unsteadiness on feet: Secondary | ICD-10-CM | POA: Diagnosis not present

## 2023-04-25 DIAGNOSIS — R41841 Cognitive communication deficit: Secondary | ICD-10-CM | POA: Diagnosis not present

## 2023-04-25 DIAGNOSIS — I69352 Hemiplegia and hemiparesis following cerebral infarction affecting left dominant side: Secondary | ICD-10-CM | POA: Diagnosis not present

## 2023-04-25 DIAGNOSIS — R2689 Other abnormalities of gait and mobility: Secondary | ICD-10-CM | POA: Diagnosis not present

## 2023-04-26 DIAGNOSIS — R41841 Cognitive communication deficit: Secondary | ICD-10-CM | POA: Diagnosis not present

## 2023-04-26 DIAGNOSIS — R2681 Unsteadiness on feet: Secondary | ICD-10-CM | POA: Diagnosis not present

## 2023-04-26 DIAGNOSIS — R2689 Other abnormalities of gait and mobility: Secondary | ICD-10-CM | POA: Diagnosis not present

## 2023-04-26 DIAGNOSIS — I69352 Hemiplegia and hemiparesis following cerebral infarction affecting left dominant side: Secondary | ICD-10-CM | POA: Diagnosis not present

## 2023-04-26 DIAGNOSIS — I69328 Other speech and language deficits following cerebral infarction: Secondary | ICD-10-CM | POA: Diagnosis not present

## 2023-04-27 DIAGNOSIS — R41841 Cognitive communication deficit: Secondary | ICD-10-CM | POA: Diagnosis not present

## 2023-04-27 DIAGNOSIS — R2681 Unsteadiness on feet: Secondary | ICD-10-CM | POA: Diagnosis not present

## 2023-04-27 DIAGNOSIS — R2689 Other abnormalities of gait and mobility: Secondary | ICD-10-CM | POA: Diagnosis not present

## 2023-04-27 DIAGNOSIS — I69328 Other speech and language deficits following cerebral infarction: Secondary | ICD-10-CM | POA: Diagnosis not present

## 2023-04-27 DIAGNOSIS — I69352 Hemiplegia and hemiparesis following cerebral infarction affecting left dominant side: Secondary | ICD-10-CM | POA: Diagnosis not present

## 2023-04-29 DIAGNOSIS — R2681 Unsteadiness on feet: Secondary | ICD-10-CM | POA: Diagnosis not present

## 2023-04-29 DIAGNOSIS — I69328 Other speech and language deficits following cerebral infarction: Secondary | ICD-10-CM | POA: Diagnosis not present

## 2023-04-29 DIAGNOSIS — I69352 Hemiplegia and hemiparesis following cerebral infarction affecting left dominant side: Secondary | ICD-10-CM | POA: Diagnosis not present

## 2023-04-29 DIAGNOSIS — R2689 Other abnormalities of gait and mobility: Secondary | ICD-10-CM | POA: Diagnosis not present

## 2023-04-29 DIAGNOSIS — R41841 Cognitive communication deficit: Secondary | ICD-10-CM | POA: Diagnosis not present

## 2023-05-05 DIAGNOSIS — R2689 Other abnormalities of gait and mobility: Secondary | ICD-10-CM | POA: Diagnosis not present

## 2023-05-05 DIAGNOSIS — I69352 Hemiplegia and hemiparesis following cerebral infarction affecting left dominant side: Secondary | ICD-10-CM | POA: Diagnosis not present

## 2023-05-05 DIAGNOSIS — I69328 Other speech and language deficits following cerebral infarction: Secondary | ICD-10-CM | POA: Diagnosis not present

## 2023-05-05 DIAGNOSIS — R41841 Cognitive communication deficit: Secondary | ICD-10-CM | POA: Diagnosis not present

## 2023-05-05 DIAGNOSIS — R2681 Unsteadiness on feet: Secondary | ICD-10-CM | POA: Diagnosis not present

## 2023-05-06 DIAGNOSIS — R41841 Cognitive communication deficit: Secondary | ICD-10-CM | POA: Diagnosis not present

## 2023-05-06 DIAGNOSIS — R2681 Unsteadiness on feet: Secondary | ICD-10-CM | POA: Diagnosis not present

## 2023-05-06 DIAGNOSIS — I69328 Other speech and language deficits following cerebral infarction: Secondary | ICD-10-CM | POA: Diagnosis not present

## 2023-05-06 DIAGNOSIS — I69352 Hemiplegia and hemiparesis following cerebral infarction affecting left dominant side: Secondary | ICD-10-CM | POA: Diagnosis not present

## 2023-05-06 DIAGNOSIS — R2689 Other abnormalities of gait and mobility: Secondary | ICD-10-CM | POA: Diagnosis not present

## 2023-05-07 DIAGNOSIS — R2681 Unsteadiness on feet: Secondary | ICD-10-CM | POA: Diagnosis not present

## 2023-05-07 DIAGNOSIS — I69328 Other speech and language deficits following cerebral infarction: Secondary | ICD-10-CM | POA: Diagnosis not present

## 2023-05-07 DIAGNOSIS — R41841 Cognitive communication deficit: Secondary | ICD-10-CM | POA: Diagnosis not present

## 2023-05-07 DIAGNOSIS — I69352 Hemiplegia and hemiparesis following cerebral infarction affecting left dominant side: Secondary | ICD-10-CM | POA: Diagnosis not present

## 2023-05-07 DIAGNOSIS — R2689 Other abnormalities of gait and mobility: Secondary | ICD-10-CM | POA: Diagnosis not present

## 2023-05-08 DIAGNOSIS — R2681 Unsteadiness on feet: Secondary | ICD-10-CM | POA: Diagnosis not present

## 2023-05-08 DIAGNOSIS — R2689 Other abnormalities of gait and mobility: Secondary | ICD-10-CM | POA: Diagnosis not present

## 2023-05-08 DIAGNOSIS — I69328 Other speech and language deficits following cerebral infarction: Secondary | ICD-10-CM | POA: Diagnosis not present

## 2023-05-08 DIAGNOSIS — R41841 Cognitive communication deficit: Secondary | ICD-10-CM | POA: Diagnosis not present

## 2023-05-08 DIAGNOSIS — I69352 Hemiplegia and hemiparesis following cerebral infarction affecting left dominant side: Secondary | ICD-10-CM | POA: Diagnosis not present

## 2023-05-12 DIAGNOSIS — E46 Unspecified protein-calorie malnutrition: Secondary | ICD-10-CM | POA: Diagnosis not present

## 2023-05-12 DIAGNOSIS — I4891 Unspecified atrial fibrillation: Secondary | ICD-10-CM | POA: Diagnosis not present

## 2023-05-12 DIAGNOSIS — I10 Essential (primary) hypertension: Secondary | ICD-10-CM | POA: Diagnosis not present

## 2023-05-12 DIAGNOSIS — E119 Type 2 diabetes mellitus without complications: Secondary | ICD-10-CM | POA: Diagnosis not present

## 2023-05-14 DIAGNOSIS — I69328 Other speech and language deficits following cerebral infarction: Secondary | ICD-10-CM | POA: Diagnosis not present

## 2023-05-14 DIAGNOSIS — I69352 Hemiplegia and hemiparesis following cerebral infarction affecting left dominant side: Secondary | ICD-10-CM | POA: Diagnosis not present

## 2023-05-14 DIAGNOSIS — R41841 Cognitive communication deficit: Secondary | ICD-10-CM | POA: Diagnosis not present

## 2023-05-14 DIAGNOSIS — R2689 Other abnormalities of gait and mobility: Secondary | ICD-10-CM | POA: Diagnosis not present

## 2023-05-14 DIAGNOSIS — R2681 Unsteadiness on feet: Secondary | ICD-10-CM | POA: Diagnosis not present

## 2023-06-10 DIAGNOSIS — E039 Hypothyroidism, unspecified: Secondary | ICD-10-CM | POA: Diagnosis not present

## 2023-06-10 DIAGNOSIS — E119 Type 2 diabetes mellitus without complications: Secondary | ICD-10-CM | POA: Diagnosis not present

## 2023-06-10 DIAGNOSIS — I4891 Unspecified atrial fibrillation: Secondary | ICD-10-CM | POA: Diagnosis not present

## 2023-06-10 DIAGNOSIS — I10 Essential (primary) hypertension: Secondary | ICD-10-CM | POA: Diagnosis not present

## 2023-06-11 DIAGNOSIS — M25512 Pain in left shoulder: Secondary | ICD-10-CM | POA: Diagnosis not present

## 2023-06-11 DIAGNOSIS — I69352 Hemiplegia and hemiparesis following cerebral infarction affecting left dominant side: Secondary | ICD-10-CM | POA: Diagnosis not present

## 2023-06-13 DIAGNOSIS — I69352 Hemiplegia and hemiparesis following cerebral infarction affecting left dominant side: Secondary | ICD-10-CM | POA: Diagnosis not present

## 2023-06-13 DIAGNOSIS — M25512 Pain in left shoulder: Secondary | ICD-10-CM | POA: Diagnosis not present

## 2023-06-15 DIAGNOSIS — I69352 Hemiplegia and hemiparesis following cerebral infarction affecting left dominant side: Secondary | ICD-10-CM | POA: Diagnosis not present

## 2023-06-15 DIAGNOSIS — M25512 Pain in left shoulder: Secondary | ICD-10-CM | POA: Diagnosis not present

## 2023-06-16 DIAGNOSIS — I69352 Hemiplegia and hemiparesis following cerebral infarction affecting left dominant side: Secondary | ICD-10-CM | POA: Diagnosis not present

## 2023-06-16 DIAGNOSIS — M25512 Pain in left shoulder: Secondary | ICD-10-CM | POA: Diagnosis not present

## 2023-06-17 DIAGNOSIS — I69352 Hemiplegia and hemiparesis following cerebral infarction affecting left dominant side: Secondary | ICD-10-CM | POA: Diagnosis not present

## 2023-06-17 DIAGNOSIS — M25512 Pain in left shoulder: Secondary | ICD-10-CM | POA: Diagnosis not present

## 2023-06-18 DIAGNOSIS — I69352 Hemiplegia and hemiparesis following cerebral infarction affecting left dominant side: Secondary | ICD-10-CM | POA: Diagnosis not present

## 2023-06-18 DIAGNOSIS — M25512 Pain in left shoulder: Secondary | ICD-10-CM | POA: Diagnosis not present

## 2023-06-20 DIAGNOSIS — M25512 Pain in left shoulder: Secondary | ICD-10-CM | POA: Diagnosis not present

## 2023-06-20 DIAGNOSIS — I69352 Hemiplegia and hemiparesis following cerebral infarction affecting left dominant side: Secondary | ICD-10-CM | POA: Diagnosis not present

## 2023-06-21 DIAGNOSIS — M25512 Pain in left shoulder: Secondary | ICD-10-CM | POA: Diagnosis not present

## 2023-06-21 DIAGNOSIS — I69352 Hemiplegia and hemiparesis following cerebral infarction affecting left dominant side: Secondary | ICD-10-CM | POA: Diagnosis not present

## 2023-06-22 DIAGNOSIS — I69352 Hemiplegia and hemiparesis following cerebral infarction affecting left dominant side: Secondary | ICD-10-CM | POA: Diagnosis not present

## 2023-06-22 DIAGNOSIS — M25512 Pain in left shoulder: Secondary | ICD-10-CM | POA: Diagnosis not present

## 2023-06-23 DIAGNOSIS — I69352 Hemiplegia and hemiparesis following cerebral infarction affecting left dominant side: Secondary | ICD-10-CM | POA: Diagnosis not present

## 2023-06-23 DIAGNOSIS — M25512 Pain in left shoulder: Secondary | ICD-10-CM | POA: Diagnosis not present

## 2023-06-25 DIAGNOSIS — I69352 Hemiplegia and hemiparesis following cerebral infarction affecting left dominant side: Secondary | ICD-10-CM | POA: Diagnosis not present

## 2023-06-25 DIAGNOSIS — M25512 Pain in left shoulder: Secondary | ICD-10-CM | POA: Diagnosis not present

## 2023-06-26 DIAGNOSIS — I69352 Hemiplegia and hemiparesis following cerebral infarction affecting left dominant side: Secondary | ICD-10-CM | POA: Diagnosis not present

## 2023-06-26 DIAGNOSIS — M25512 Pain in left shoulder: Secondary | ICD-10-CM | POA: Diagnosis not present

## 2023-06-29 DIAGNOSIS — I69352 Hemiplegia and hemiparesis following cerebral infarction affecting left dominant side: Secondary | ICD-10-CM | POA: Diagnosis not present

## 2023-06-29 DIAGNOSIS — M25512 Pain in left shoulder: Secondary | ICD-10-CM | POA: Diagnosis not present

## 2023-06-30 DIAGNOSIS — I69352 Hemiplegia and hemiparesis following cerebral infarction affecting left dominant side: Secondary | ICD-10-CM | POA: Diagnosis not present

## 2023-06-30 DIAGNOSIS — M25512 Pain in left shoulder: Secondary | ICD-10-CM | POA: Diagnosis not present

## 2023-07-01 DIAGNOSIS — M25512 Pain in left shoulder: Secondary | ICD-10-CM | POA: Diagnosis not present

## 2023-07-01 DIAGNOSIS — I69352 Hemiplegia and hemiparesis following cerebral infarction affecting left dominant side: Secondary | ICD-10-CM | POA: Diagnosis not present

## 2023-07-02 DIAGNOSIS — M25512 Pain in left shoulder: Secondary | ICD-10-CM | POA: Diagnosis not present

## 2023-07-02 DIAGNOSIS — I69352 Hemiplegia and hemiparesis following cerebral infarction affecting left dominant side: Secondary | ICD-10-CM | POA: Diagnosis not present

## 2023-07-03 DIAGNOSIS — M25512 Pain in left shoulder: Secondary | ICD-10-CM | POA: Diagnosis not present

## 2023-07-03 DIAGNOSIS — I69352 Hemiplegia and hemiparesis following cerebral infarction affecting left dominant side: Secondary | ICD-10-CM | POA: Diagnosis not present

## 2023-07-07 DIAGNOSIS — M25512 Pain in left shoulder: Secondary | ICD-10-CM | POA: Diagnosis not present

## 2023-07-07 DIAGNOSIS — I69352 Hemiplegia and hemiparesis following cerebral infarction affecting left dominant side: Secondary | ICD-10-CM | POA: Diagnosis not present

## 2023-07-08 DIAGNOSIS — M25512 Pain in left shoulder: Secondary | ICD-10-CM | POA: Diagnosis not present

## 2023-07-08 DIAGNOSIS — I69352 Hemiplegia and hemiparesis following cerebral infarction affecting left dominant side: Secondary | ICD-10-CM | POA: Diagnosis not present

## 2023-07-09 DIAGNOSIS — I4891 Unspecified atrial fibrillation: Secondary | ICD-10-CM | POA: Diagnosis not present

## 2023-07-09 DIAGNOSIS — I10 Essential (primary) hypertension: Secondary | ICD-10-CM | POA: Diagnosis not present

## 2023-07-09 DIAGNOSIS — M25512 Pain in left shoulder: Secondary | ICD-10-CM | POA: Diagnosis not present

## 2023-07-09 DIAGNOSIS — E119 Type 2 diabetes mellitus without complications: Secondary | ICD-10-CM | POA: Diagnosis not present

## 2023-07-09 DIAGNOSIS — I69352 Hemiplegia and hemiparesis following cerebral infarction affecting left dominant side: Secondary | ICD-10-CM | POA: Diagnosis not present

## 2023-07-09 DIAGNOSIS — E039 Hypothyroidism, unspecified: Secondary | ICD-10-CM | POA: Diagnosis not present

## 2023-07-14 DIAGNOSIS — I10 Essential (primary) hypertension: Secondary | ICD-10-CM | POA: Diagnosis not present

## 2023-07-14 DIAGNOSIS — E119 Type 2 diabetes mellitus without complications: Secondary | ICD-10-CM | POA: Diagnosis not present

## 2023-07-18 DIAGNOSIS — N390 Urinary tract infection, site not specified: Secondary | ICD-10-CM | POA: Diagnosis not present

## 2023-07-20 DIAGNOSIS — N390 Urinary tract infection, site not specified: Secondary | ICD-10-CM | POA: Diagnosis not present

## 2023-07-23 DIAGNOSIS — I69352 Hemiplegia and hemiparesis following cerebral infarction affecting left dominant side: Secondary | ICD-10-CM | POA: Diagnosis not present

## 2023-07-23 DIAGNOSIS — I69391 Dysphagia following cerebral infarction: Secondary | ICD-10-CM | POA: Diagnosis not present

## 2023-07-23 DIAGNOSIS — R1312 Dysphagia, oropharyngeal phase: Secondary | ICD-10-CM | POA: Diagnosis not present

## 2023-07-24 DIAGNOSIS — R1312 Dysphagia, oropharyngeal phase: Secondary | ICD-10-CM | POA: Diagnosis not present

## 2023-07-24 DIAGNOSIS — I69352 Hemiplegia and hemiparesis following cerebral infarction affecting left dominant side: Secondary | ICD-10-CM | POA: Diagnosis not present

## 2023-07-24 DIAGNOSIS — I69391 Dysphagia following cerebral infarction: Secondary | ICD-10-CM | POA: Diagnosis not present

## 2023-07-26 DIAGNOSIS — R1312 Dysphagia, oropharyngeal phase: Secondary | ICD-10-CM | POA: Diagnosis not present

## 2023-07-26 DIAGNOSIS — I69352 Hemiplegia and hemiparesis following cerebral infarction affecting left dominant side: Secondary | ICD-10-CM | POA: Diagnosis not present

## 2023-07-26 DIAGNOSIS — I69391 Dysphagia following cerebral infarction: Secondary | ICD-10-CM | POA: Diagnosis not present

## 2023-07-27 DIAGNOSIS — I480 Paroxysmal atrial fibrillation: Secondary | ICD-10-CM | POA: Diagnosis not present

## 2023-07-27 DIAGNOSIS — I69391 Dysphagia following cerebral infarction: Secondary | ICD-10-CM | POA: Diagnosis not present

## 2023-07-27 DIAGNOSIS — R1312 Dysphagia, oropharyngeal phase: Secondary | ICD-10-CM | POA: Diagnosis not present

## 2023-07-27 DIAGNOSIS — F321 Major depressive disorder, single episode, moderate: Secondary | ICD-10-CM | POA: Diagnosis not present

## 2023-07-27 DIAGNOSIS — M6281 Muscle weakness (generalized): Secondary | ICD-10-CM | POA: Diagnosis not present

## 2023-07-27 DIAGNOSIS — I69352 Hemiplegia and hemiparesis following cerebral infarction affecting left dominant side: Secondary | ICD-10-CM | POA: Diagnosis not present

## 2023-07-28 DIAGNOSIS — R1312 Dysphagia, oropharyngeal phase: Secondary | ICD-10-CM | POA: Diagnosis not present

## 2023-07-28 DIAGNOSIS — I69391 Dysphagia following cerebral infarction: Secondary | ICD-10-CM | POA: Diagnosis not present

## 2023-07-28 DIAGNOSIS — I69352 Hemiplegia and hemiparesis following cerebral infarction affecting left dominant side: Secondary | ICD-10-CM | POA: Diagnosis not present

## 2023-07-29 DIAGNOSIS — E039 Hypothyroidism, unspecified: Secondary | ICD-10-CM | POA: Diagnosis not present

## 2023-07-30 DIAGNOSIS — I69352 Hemiplegia and hemiparesis following cerebral infarction affecting left dominant side: Secondary | ICD-10-CM | POA: Diagnosis not present

## 2023-07-30 DIAGNOSIS — R1312 Dysphagia, oropharyngeal phase: Secondary | ICD-10-CM | POA: Diagnosis not present

## 2023-07-30 DIAGNOSIS — I69391 Dysphagia following cerebral infarction: Secondary | ICD-10-CM | POA: Diagnosis not present

## 2023-07-31 DIAGNOSIS — I69391 Dysphagia following cerebral infarction: Secondary | ICD-10-CM | POA: Diagnosis not present

## 2023-07-31 DIAGNOSIS — R1312 Dysphagia, oropharyngeal phase: Secondary | ICD-10-CM | POA: Diagnosis not present

## 2023-07-31 DIAGNOSIS — I69352 Hemiplegia and hemiparesis following cerebral infarction affecting left dominant side: Secondary | ICD-10-CM | POA: Diagnosis not present

## 2023-08-03 DIAGNOSIS — R1312 Dysphagia, oropharyngeal phase: Secondary | ICD-10-CM | POA: Diagnosis not present

## 2023-08-03 DIAGNOSIS — I69352 Hemiplegia and hemiparesis following cerebral infarction affecting left dominant side: Secondary | ICD-10-CM | POA: Diagnosis not present

## 2023-08-03 DIAGNOSIS — I69391 Dysphagia following cerebral infarction: Secondary | ICD-10-CM | POA: Diagnosis not present

## 2023-08-04 DIAGNOSIS — I69352 Hemiplegia and hemiparesis following cerebral infarction affecting left dominant side: Secondary | ICD-10-CM | POA: Diagnosis not present

## 2023-08-04 DIAGNOSIS — Z23 Encounter for immunization: Secondary | ICD-10-CM | POA: Diagnosis not present

## 2023-08-04 DIAGNOSIS — I69391 Dysphagia following cerebral infarction: Secondary | ICD-10-CM | POA: Diagnosis not present

## 2023-08-04 DIAGNOSIS — R1312 Dysphagia, oropharyngeal phase: Secondary | ICD-10-CM | POA: Diagnosis not present

## 2023-08-05 DIAGNOSIS — I69391 Dysphagia following cerebral infarction: Secondary | ICD-10-CM | POA: Diagnosis not present

## 2023-08-05 DIAGNOSIS — R1312 Dysphagia, oropharyngeal phase: Secondary | ICD-10-CM | POA: Diagnosis not present

## 2023-08-05 DIAGNOSIS — I69352 Hemiplegia and hemiparesis following cerebral infarction affecting left dominant side: Secondary | ICD-10-CM | POA: Diagnosis not present

## 2023-08-06 DIAGNOSIS — I69391 Dysphagia following cerebral infarction: Secondary | ICD-10-CM | POA: Diagnosis not present

## 2023-08-06 DIAGNOSIS — I69352 Hemiplegia and hemiparesis following cerebral infarction affecting left dominant side: Secondary | ICD-10-CM | POA: Diagnosis not present

## 2023-08-06 DIAGNOSIS — R1312 Dysphagia, oropharyngeal phase: Secondary | ICD-10-CM | POA: Diagnosis not present

## 2023-08-07 DIAGNOSIS — R1312 Dysphagia, oropharyngeal phase: Secondary | ICD-10-CM | POA: Diagnosis not present

## 2023-08-07 DIAGNOSIS — I69391 Dysphagia following cerebral infarction: Secondary | ICD-10-CM | POA: Diagnosis not present

## 2023-08-07 DIAGNOSIS — I69352 Hemiplegia and hemiparesis following cerebral infarction affecting left dominant side: Secondary | ICD-10-CM | POA: Diagnosis not present

## 2023-08-08 DIAGNOSIS — I69352 Hemiplegia and hemiparesis following cerebral infarction affecting left dominant side: Secondary | ICD-10-CM | POA: Diagnosis not present

## 2023-08-08 DIAGNOSIS — R1312 Dysphagia, oropharyngeal phase: Secondary | ICD-10-CM | POA: Diagnosis not present

## 2023-08-08 DIAGNOSIS — I69391 Dysphagia following cerebral infarction: Secondary | ICD-10-CM | POA: Diagnosis not present

## 2023-08-09 DIAGNOSIS — I69352 Hemiplegia and hemiparesis following cerebral infarction affecting left dominant side: Secondary | ICD-10-CM | POA: Diagnosis not present

## 2023-08-09 DIAGNOSIS — R1312 Dysphagia, oropharyngeal phase: Secondary | ICD-10-CM | POA: Diagnosis not present

## 2023-08-09 DIAGNOSIS — I69391 Dysphagia following cerebral infarction: Secondary | ICD-10-CM | POA: Diagnosis not present

## 2023-08-10 DIAGNOSIS — R1312 Dysphagia, oropharyngeal phase: Secondary | ICD-10-CM | POA: Diagnosis not present

## 2023-08-10 DIAGNOSIS — I69391 Dysphagia following cerebral infarction: Secondary | ICD-10-CM | POA: Diagnosis not present

## 2023-08-10 DIAGNOSIS — I69352 Hemiplegia and hemiparesis following cerebral infarction affecting left dominant side: Secondary | ICD-10-CM | POA: Diagnosis not present

## 2023-08-11 DIAGNOSIS — I4891 Unspecified atrial fibrillation: Secondary | ICD-10-CM | POA: Diagnosis not present

## 2023-08-11 DIAGNOSIS — E559 Vitamin D deficiency, unspecified: Secondary | ICD-10-CM | POA: Diagnosis not present

## 2023-08-11 DIAGNOSIS — E039 Hypothyroidism, unspecified: Secondary | ICD-10-CM | POA: Diagnosis not present

## 2023-08-11 DIAGNOSIS — E119 Type 2 diabetes mellitus without complications: Secondary | ICD-10-CM | POA: Diagnosis not present

## 2023-09-08 DIAGNOSIS — E538 Deficiency of other specified B group vitamins: Secondary | ICD-10-CM | POA: Diagnosis not present

## 2023-09-08 DIAGNOSIS — E559 Vitamin D deficiency, unspecified: Secondary | ICD-10-CM | POA: Diagnosis not present

## 2023-09-08 DIAGNOSIS — E119 Type 2 diabetes mellitus without complications: Secondary | ICD-10-CM | POA: Diagnosis not present

## 2023-09-16 DIAGNOSIS — F411 Generalized anxiety disorder: Secondary | ICD-10-CM | POA: Diagnosis not present

## 2023-09-16 DIAGNOSIS — F0390 Unspecified dementia without behavioral disturbance: Secondary | ICD-10-CM | POA: Diagnosis not present

## 2023-09-16 DIAGNOSIS — F329 Major depressive disorder, single episode, unspecified: Secondary | ICD-10-CM | POA: Diagnosis not present

## 2023-09-20 ENCOUNTER — Encounter (HOSPITAL_COMMUNITY): Payer: Self-pay

## 2023-09-20 ENCOUNTER — Inpatient Hospital Stay (HOSPITAL_COMMUNITY)
Admission: EM | Admit: 2023-09-20 | Discharge: 2023-09-26 | DRG: 689 | Disposition: A | Discharge status: Skilled Nursing Facility | Source: Skilled Nursing Facility | Attending: Internal Medicine | Admitting: Internal Medicine

## 2023-09-20 ENCOUNTER — Emergency Department (HOSPITAL_COMMUNITY)

## 2023-09-20 ENCOUNTER — Other Ambulatory Visit: Payer: Self-pay

## 2023-09-20 DIAGNOSIS — Z7984 Long term (current) use of oral hypoglycemic drugs: Secondary | ICD-10-CM

## 2023-09-20 DIAGNOSIS — Z807 Family history of other malignant neoplasms of lymphoid, hematopoietic and related tissues: Secondary | ICD-10-CM

## 2023-09-20 DIAGNOSIS — F03911 Unspecified dementia, unspecified severity, with agitation: Secondary | ICD-10-CM | POA: Diagnosis present

## 2023-09-20 DIAGNOSIS — I480 Paroxysmal atrial fibrillation: Secondary | ICD-10-CM | POA: Diagnosis present

## 2023-09-20 DIAGNOSIS — I69328 Other speech and language deficits following cerebral infarction: Secondary | ICD-10-CM | POA: Diagnosis not present

## 2023-09-20 DIAGNOSIS — R442 Other hallucinations: Secondary | ICD-10-CM | POA: Diagnosis not present

## 2023-09-20 DIAGNOSIS — Z83438 Family history of other disorder of lipoprotein metabolism and other lipidemia: Secondary | ICD-10-CM

## 2023-09-20 DIAGNOSIS — E559 Vitamin D deficiency, unspecified: Secondary | ICD-10-CM | POA: Diagnosis not present

## 2023-09-20 DIAGNOSIS — M797 Fibromyalgia: Secondary | ICD-10-CM | POA: Diagnosis present

## 2023-09-20 DIAGNOSIS — R4182 Altered mental status, unspecified: Secondary | ICD-10-CM | POA: Diagnosis not present

## 2023-09-20 DIAGNOSIS — E785 Hyperlipidemia, unspecified: Secondary | ICD-10-CM | POA: Diagnosis not present

## 2023-09-20 DIAGNOSIS — G9341 Metabolic encephalopathy: Secondary | ICD-10-CM | POA: Diagnosis not present

## 2023-09-20 DIAGNOSIS — D649 Anemia, unspecified: Secondary | ICD-10-CM | POA: Diagnosis not present

## 2023-09-20 DIAGNOSIS — I10 Essential (primary) hypertension: Secondary | ICD-10-CM | POA: Diagnosis not present

## 2023-09-20 DIAGNOSIS — F29 Unspecified psychosis not due to a substance or known physiological condition: Secondary | ICD-10-CM | POA: Diagnosis not present

## 2023-09-20 DIAGNOSIS — A419 Sepsis, unspecified organism: Secondary | ICD-10-CM | POA: Diagnosis not present

## 2023-09-20 DIAGNOSIS — M1990 Unspecified osteoarthritis, unspecified site: Secondary | ICD-10-CM | POA: Diagnosis not present

## 2023-09-20 DIAGNOSIS — N3010 Interstitial cystitis (chronic) without hematuria: Secondary | ICD-10-CM | POA: Diagnosis not present

## 2023-09-20 DIAGNOSIS — E46 Unspecified protein-calorie malnutrition: Secondary | ICD-10-CM | POA: Diagnosis not present

## 2023-09-20 DIAGNOSIS — N390 Urinary tract infection, site not specified: Secondary | ICD-10-CM | POA: Diagnosis not present

## 2023-09-20 DIAGNOSIS — N3000 Acute cystitis without hematuria: Principal | ICD-10-CM

## 2023-09-20 DIAGNOSIS — R319 Hematuria, unspecified: Secondary | ICD-10-CM | POA: Diagnosis not present

## 2023-09-20 DIAGNOSIS — Z886 Allergy status to analgesic agent status: Secondary | ICD-10-CM

## 2023-09-20 DIAGNOSIS — R069 Unspecified abnormalities of breathing: Secondary | ICD-10-CM | POA: Diagnosis not present

## 2023-09-20 DIAGNOSIS — E1165 Type 2 diabetes mellitus with hyperglycemia: Secondary | ICD-10-CM | POA: Diagnosis not present

## 2023-09-20 DIAGNOSIS — I493 Ventricular premature depolarization: Secondary | ICD-10-CM | POA: Diagnosis not present

## 2023-09-20 DIAGNOSIS — F0393 Unspecified dementia, unspecified severity, with mood disturbance: Secondary | ICD-10-CM | POA: Diagnosis present

## 2023-09-20 DIAGNOSIS — H9190 Unspecified hearing loss, unspecified ear: Secondary | ICD-10-CM | POA: Diagnosis present

## 2023-09-20 DIAGNOSIS — R2689 Other abnormalities of gait and mobility: Secondary | ICD-10-CM | POA: Diagnosis not present

## 2023-09-20 DIAGNOSIS — F32A Depression, unspecified: Secondary | ICD-10-CM | POA: Diagnosis present

## 2023-09-20 DIAGNOSIS — Z7901 Long term (current) use of anticoagulants: Secondary | ICD-10-CM

## 2023-09-20 DIAGNOSIS — Z79890 Hormone replacement therapy (postmenopausal): Secondary | ICD-10-CM | POA: Diagnosis not present

## 2023-09-20 DIAGNOSIS — Z9049 Acquired absence of other specified parts of digestive tract: Secondary | ICD-10-CM

## 2023-09-20 DIAGNOSIS — R627 Adult failure to thrive: Secondary | ICD-10-CM | POA: Diagnosis not present

## 2023-09-20 DIAGNOSIS — M6281 Muscle weakness (generalized): Secondary | ICD-10-CM | POA: Diagnosis not present

## 2023-09-20 DIAGNOSIS — E039 Hypothyroidism, unspecified: Secondary | ICD-10-CM | POA: Diagnosis not present

## 2023-09-20 DIAGNOSIS — Z79899 Other long term (current) drug therapy: Secondary | ICD-10-CM

## 2023-09-20 DIAGNOSIS — F0392 Unspecified dementia, unspecified severity, with psychotic disturbance: Secondary | ICD-10-CM | POA: Diagnosis not present

## 2023-09-20 DIAGNOSIS — Z90710 Acquired absence of both cervix and uterus: Secondary | ICD-10-CM

## 2023-09-20 DIAGNOSIS — Z794 Long term (current) use of insulin: Secondary | ICD-10-CM

## 2023-09-20 DIAGNOSIS — Z7401 Bed confinement status: Secondary | ICD-10-CM | POA: Diagnosis not present

## 2023-09-20 DIAGNOSIS — F22 Delusional disorders: Secondary | ICD-10-CM | POA: Diagnosis not present

## 2023-09-20 DIAGNOSIS — I499 Cardiac arrhythmia, unspecified: Secondary | ICD-10-CM | POA: Diagnosis not present

## 2023-09-20 DIAGNOSIS — I69352 Hemiplegia and hemiparesis following cerebral infarction affecting left dominant side: Secondary | ICD-10-CM | POA: Diagnosis not present

## 2023-09-20 DIAGNOSIS — E119 Type 2 diabetes mellitus without complications: Secondary | ICD-10-CM

## 2023-09-20 DIAGNOSIS — Z87440 Personal history of urinary (tract) infections: Secondary | ICD-10-CM | POA: Diagnosis not present

## 2023-09-20 DIAGNOSIS — I959 Hypotension, unspecified: Secondary | ICD-10-CM | POA: Diagnosis not present

## 2023-09-20 DIAGNOSIS — Z87410 Personal history of cervical dysplasia: Secondary | ICD-10-CM

## 2023-09-20 DIAGNOSIS — R451 Restlessness and agitation: Secondary | ICD-10-CM | POA: Diagnosis not present

## 2023-09-20 DIAGNOSIS — E872 Acidosis, unspecified: Secondary | ICD-10-CM | POA: Diagnosis present

## 2023-09-20 DIAGNOSIS — Z0389 Encounter for observation for other suspected diseases and conditions ruled out: Secondary | ICD-10-CM | POA: Diagnosis not present

## 2023-09-20 DIAGNOSIS — Z803 Family history of malignant neoplasm of breast: Secondary | ICD-10-CM

## 2023-09-20 DIAGNOSIS — R1312 Dysphagia, oropharyngeal phase: Secondary | ICD-10-CM | POA: Diagnosis not present

## 2023-09-20 DIAGNOSIS — Z882 Allergy status to sulfonamides status: Secondary | ICD-10-CM

## 2023-09-20 DIAGNOSIS — R251 Tremor, unspecified: Secondary | ICD-10-CM | POA: Diagnosis not present

## 2023-09-20 DIAGNOSIS — R41841 Cognitive communication deficit: Secondary | ICD-10-CM | POA: Diagnosis not present

## 2023-09-20 DIAGNOSIS — Z8249 Family history of ischemic heart disease and other diseases of the circulatory system: Secondary | ICD-10-CM | POA: Diagnosis not present

## 2023-09-20 DIAGNOSIS — Z860101 Personal history of adenomatous and serrated colon polyps: Secondary | ICD-10-CM

## 2023-09-20 DIAGNOSIS — Z1152 Encounter for screening for COVID-19: Secondary | ICD-10-CM | POA: Diagnosis not present

## 2023-09-20 DIAGNOSIS — Z888 Allergy status to other drugs, medicaments and biological substances status: Secondary | ICD-10-CM

## 2023-09-20 DIAGNOSIS — R531 Weakness: Secondary | ICD-10-CM | POA: Diagnosis not present

## 2023-09-20 DIAGNOSIS — I700 Atherosclerosis of aorta: Secondary | ICD-10-CM | POA: Diagnosis not present

## 2023-09-20 DIAGNOSIS — I6782 Cerebral ischemia: Secondary | ICD-10-CM | POA: Diagnosis not present

## 2023-09-20 DIAGNOSIS — Z823 Family history of stroke: Secondary | ICD-10-CM | POA: Diagnosis not present

## 2023-09-20 DIAGNOSIS — Z91013 Allergy to seafood: Secondary | ICD-10-CM

## 2023-09-20 DIAGNOSIS — Z8673 Personal history of transient ischemic attack (TIA), and cerebral infarction without residual deficits: Secondary | ICD-10-CM

## 2023-09-20 LAB — URINALYSIS, ROUTINE W REFLEX MICROSCOPIC
Bilirubin Urine: NEGATIVE
Glucose, UA: NEGATIVE mg/dL
Ketones, ur: NEGATIVE mg/dL
Nitrite: NEGATIVE
Protein, ur: NEGATIVE mg/dL
Specific Gravity, Urine: 1.006 (ref 1.005–1.030)
WBC, UA: >50 WBC/hpf (ref 0–5)
pH: 6 (ref 5.0–8.0)

## 2023-09-20 LAB — CBC WITH DIFFERENTIAL/PLATELET
Abs Immature Granulocytes: 0.03 K/uL (ref 0.00–0.07)
Basophils Absolute: 0 K/uL (ref 0.0–0.1)
Basophils Relative: 0 %
Eosinophils Absolute: 0.2 K/uL (ref 0.0–0.5)
Eosinophils Relative: 2 %
HCT: 32.6 % — ABNORMAL LOW (ref 36.0–46.0)
Hemoglobin: 10.5 g/dL — ABNORMAL LOW (ref 12.0–15.0)
Immature Granulocytes: 0 %
Lymphocytes Relative: 24 %
Lymphs Abs: 2 K/uL (ref 0.7–4.0)
MCH: 30.3 pg (ref 26.0–34.0)
MCHC: 32.2 g/dL (ref 30.0–36.0)
MCV: 94.2 fL (ref 80.0–100.0)
Monocytes Absolute: 0.7 K/uL (ref 0.1–1.0)
Monocytes Relative: 9 %
Neutro Abs: 5.1 K/uL (ref 1.7–7.7)
Neutrophils Relative %: 65 %
Platelets: 267 K/uL (ref 150–400)
RBC: 3.46 MIL/uL — ABNORMAL LOW (ref 3.87–5.11)
RDW: 15.7 % — ABNORMAL HIGH (ref 11.5–15.5)
WBC: 8 K/uL (ref 4.0–10.5)
nRBC: 0 % (ref 0.0–0.2)

## 2023-09-20 LAB — COMPREHENSIVE METABOLIC PANEL WITH GFR
ALT: 16 U/L (ref 0–44)
AST: 34 U/L (ref 15–41)
Albumin: 2.8 g/dL — ABNORMAL LOW (ref 3.5–5.0)
Alkaline Phosphatase: 71 U/L (ref 38–126)
Anion gap: 11 (ref 5–15)
BUN: 13 mg/dL (ref 8–23)
CO2: 24 mmol/L (ref 22–32)
Calcium: 8.9 mg/dL (ref 8.9–10.3)
Chloride: 103 mmol/L (ref 98–111)
Creatinine, Ser: 0.79 mg/dL (ref 0.44–1.00)
GFR, Estimated: >60 mL/min (ref >60)
Glucose, Bld: 272 mg/dL — ABNORMAL HIGH (ref 70–99)
Potassium: 4.9 mmol/L (ref 3.5–5.1)
Sodium: 138 mmol/L (ref 135–145)
Total Bilirubin: 1.5 mg/dL — ABNORMAL HIGH (ref 0.0–1.2)
Total Protein: 6.2 g/dL — ABNORMAL LOW (ref 6.5–8.1)

## 2023-09-20 LAB — RAPID URINE DRUG SCREEN, HOSP PERFORMED
Amphetamines: NOT DETECTED
Barbiturates: NOT DETECTED
Benzodiazepines: NOT DETECTED
Cocaine: NOT DETECTED
Opiates: NOT DETECTED
Tetrahydrocannabinol: NOT DETECTED

## 2023-09-20 LAB — PROTIME-INR
INR: 1.3 — ABNORMAL HIGH (ref 0.8–1.2)
Prothrombin Time: 16.6 s — ABNORMAL HIGH (ref 11.4–15.2)

## 2023-09-20 LAB — I-STAT CG4 LACTIC ACID, ED
Lactic Acid, Venous: 2.4 mmol/L (ref 0.5–1.9)
Lactic Acid, Venous: 3.1 mmol/L (ref 0.5–1.9)

## 2023-09-20 LAB — RESP PANEL BY RT-PCR (RSV, FLU A&B, COVID)  RVPGX2
Influenza A by PCR: NEGATIVE
Influenza B by PCR: NEGATIVE
Resp Syncytial Virus by PCR: NEGATIVE
SARS Coronavirus 2 by RT PCR: NEGATIVE

## 2023-09-20 LAB — ETHANOL: Alcohol, Ethyl (B): <15 mg/dL (ref <15)

## 2023-09-20 MED ORDER — LACTATED RINGERS IV BOLUS
1000.0000 mL | Freq: Once | INTRAVENOUS | Status: AC
  Administered 2023-09-20: 1000 mL via INTRAVENOUS

## 2023-09-20 MED ORDER — METRONIDAZOLE 500 MG/100ML IV SOLN
500.0000 mg | Freq: Once | INTRAVENOUS | Status: AC
  Administered 2023-09-20: 500 mg via INTRAVENOUS
  Filled 2023-09-20: qty 100

## 2023-09-20 MED ORDER — VANCOMYCIN HCL IN DEXTROSE 1-5 GM/200ML-% IV SOLN
1000.0000 mg | Freq: Once | INTRAVENOUS | Status: AC
  Administered 2023-09-20: 1000 mg via INTRAVENOUS
  Filled 2023-09-20: qty 200

## 2023-09-20 MED ORDER — SODIUM CHLORIDE 0.9 % IV SOLN
2.0000 g | Freq: Once | INTRAVENOUS | Status: AC
  Administered 2023-09-20: 2 g via INTRAVENOUS
  Filled 2023-09-20: qty 12.5

## 2023-09-20 NOTE — ED Provider Notes (Signed)
 [place] EMERGENCY DEPARTMENT AT [facility] Provider Note  MDM   HPI/ROS:  Jaclyn [name] is a 88 y.o. female with a medical history as below who presents due to concern for altered mental status.  Patient is demented and altered thus history was obtained via EMS and son.  Son reports that she has been much more confused and hallucinating over the last few days.  He states that this often happens when she has UTIs.  EMS reports she had low blood pressure for which she received 500 of IV fluids with good response.  Patient is unable to provide any further history.  Physical exam is notable for: - Generally well-appearing, hypotension.  Alert to self only.  Otherwise benign neurologic exam  On my initial evaluation, patient is:  -Vital signs stable. Patient afebrile, hemodynamically stable, and non-toxic appearing.   Differentials include Hypoxic encephalopathy, metabolic encephalopathy, hypoglycemia, electrolyte abnormalities, hepatic encephalopathy, uremia, endocrine abnormality, CO2 narcosis, hypertensive encephalopathy, toxins/intoxication, alcohol withdrawal, drug reactions, TTP, vitamin deficiency, sepsis, meningitis, trauma, IPH, SAH, SDH, diffuse axonal injury, stroke, encephalitis, seizure, dementia, psychosis, delirium, hypo/hyperthermia.    Patient is generally well-appearing and interactive on exam.  Given the uncertainty here surrounding her worsening mentation will obtain broad workup.  Results are as below and most concerning for the urinary tract infection.  She received multiple boluses of IV fluid.  She has a small lactic acidosis is currently trending up however additional IV fluid boluses were given.  Antibiotics were initiated on arrival given concern for possible infection.  Given her hypotension in the setting of a UTI she will require admission for further workup and treatment.  She was admitted to the hospitalist service in stable condition handoff was given all  questions answered the best my ability.  Interpretations, interventions, and the patient's course of care are documented below.    Clinical Course as of 09/21/23 0005  [place] Sep 20, 2023  2001 EKG 12-Lead Sinus rhythm with PVCs.  Normal axis and intervals.  Poor R wave progression.  No STE or depression [RC]  2037 Lactic 2.4.  IVF previously initiated as well as antibiotics [RC]  2213 CMP without electrolyte abnormalities.  Renal and hepatic function appropriate. [RC]  2213 Hemoglobin(!): 10.5 Near baseline.  No leukocytosis on CBC [RC]  2213 Lactic Acid, Venous(!!): 2.4 [RC]  2213 Bacteria, UA(!): MANY [RC]  2213 WBC, UA: >50 [RC]  2213 INR(!): 1.3 [RC]    Clinical Course User Index [RC] Sharyne Darina RAMAN, MD      Disposition:  I discussed the case with hospitalist who graciously agreed to admit the patient to their service for continued care.   Clinical Impression:  1. Acute cystitis without hematuria   2. Altered mental status, unspecified altered mental status type     The plan for this patient was discussed with Dr. Franklyn, who voiced agreement and who oversaw evaluation and treatment of this patient.   Clinical Complexity A medically appropriate history, review of systems, and physical exam was performed.  My independent interpretations of EKG, labs, and radiology are documented in the ED course above.   If decision rules were used in this patient's evaluation, they are listed below.   Click here for ABCD2, HEART and other calculatorsREFRESH Note before signing   Patient's presentation is most consistent with acute presentation with potential threat to life or bodily function.  Medical Decision Making Amount and/or Complexity of Data Reviewed Labs: ordered. Decision-making details documented in ED Course. Radiology: ordered.  ECG/medicine tests:  Decision-making details documented in ED Course.  Risk Prescription drug management. Decision regarding  hospitalization.    HPI/ROS      See MDM section for pertinent HPI and ROS. A complete ROS was performed with pertinent positives/negatives noted above.   Past Medical History:  Diagnosis Date   Atrial fibrillation (HCC)    Benign neoplasm of colon    Carotid artery occlusion    60-79% right ICA stenosis   Dementia (HCC)    Difficult intubation 02/17/2006   During surgery to remove large polyp   Diverticulosis of colon (without mention of hemorrhage)    Dysthymic disorder    Fibromyalgia    Headache(784.0)    HOH (hard of hearing)    Insomnia, unspecified    Interstitial cystitis    Osteoarthrosis, unspecified whether generalized or localized, unspecified site    Other and unspecified hyperlipidemia    Other chest pain    Other specified benign mammary dysplasias    SunDown syndrome    pt can get agressive and combative during the night   TIA (transient ischemic attack)    Type II or unspecified type diabetes mellitus without mention of complication, not stated as uncontrolled    Unspecified essential hypertension    Unspecified hypothyroidism    Unspecified vitamin D  deficiency    Urinary tract infection, site not specified     Past Surgical History:  Procedure Laterality Date   ABDOMINAL HYSTERECTOMY  1988   cervical dysplasia   CARDIAC CATHETERIZATION  02/19/06   EF 60%   COLONOSCOPY     ENDARTERECTOMY Right 08/26/2019   Procedure: RIGHT CAROTID ENDARTERECTOMY;  Surgeon: Oris Krystal FALCON, MD;  Location: [facility] OR;  Service: Vascular;  Laterality: Right;   PATCH ANGIOPLASTY Right 08/26/2019   Procedure: PATCH ANGIOPLASTY OF RIGHT COMMON CAROTID ARTERY USING HEMASHIELD PLATINUM FINESSE PATCH;  Surgeon: Oris Krystal FALCON, MD;  Location: [facility] OR;  Service: Vascular;  Laterality: Right;   RIGHT COLECTOMY  2005   for villous adenoma of the cecum Dr.streck   VESICOVAGINAL FISTULA CLOSURE W/ TAH  1990   w/ cystocele &retocele repairs Dr. Kiki      Physical Exam   Vitals:    09/20/23 2035 09/20/23 2040 09/20/23 2110 09/21/23 0000  BP: 103/81 90/77 109/67   Pulse: 90 88 95   Resp: 17 19 16    Temp:    98.1 F (36.7 C)  TempSrc:    Temporal  SpO2: 100% 100% 98%     Physical Exam Vitals and nursing note reviewed.  Constitutional:      General: She is not in acute distress.    Appearance: She is well-developed.  HENT:     Head: Normocephalic and atraumatic.  Eyes:     Conjunctiva/sclera: Conjunctivae normal.  Cardiovascular:     Rate and Rhythm: Normal rate and regular rhythm.     Heart sounds: No murmur heard. Pulmonary:     Effort: Pulmonary effort is normal. No respiratory distress.     Breath sounds: Normal breath sounds.  Abdominal:     Palpations: Abdomen is soft.     Tenderness: There is no abdominal tenderness.  Musculoskeletal:        General: No swelling.     Cervical back: Neck supple.  Skin:    General: Skin is warm and dry.     Capillary Refill: Capillary refill takes less than 2 seconds.  Neurological:     Mental Status: She is alert.  GCS: GCS eye subscore is 4. GCS verbal subscore is 4. GCS motor subscore is 6.     Cranial Nerves: Cranial nerves 2-12 are intact.     Sensory: Sensation is intact.     Motor: Motor function is intact.     Comments: Alert to self only  Psychiatric:        Mood and Affect: Mood normal.      Procedures   If procedures were preformed on this patient, they are listed below:  Procedures   @BBSIG @   Please note that this documentation was produced with the assistance of voice-to-text technology and may contain errors.    Sharyne Darina RAMAN, MD 09/21/23 LARI    Franklyn Sid SAILOR, MD 09/21/23 [phone]

## 2023-09-20 NOTE — ED Notes (Signed)
 Pt very agitated with blood draw. Fighting and yelling. 2 additional staff had to assist.

## 2023-09-20 NOTE — Plan of Care (Incomplete)
 88 year old female with history of paroxysmal A-fib on Eliquis , insulin -dependent type 2 diabetes, hypothyroidism, interstitial cystitis on chronic suppressive antibiotic, dementia, hard of hearing, depression, TIA, villous adenoma of the cecum status post right colectomy in 2005 presented to the ED for elevation of altered mental status x 2 days.  Family reported that patient has been crying and having hallucinations.  Reportedly her home duloxetine  dose was doubled last Thursday.  Patient was hypotensive with EMS with SBP in the 80s and received 500 mL IV fluids.  In the ED, patient noted to be hypotensive again with SBP as low as 70s but remainder of vital signs stable.  Labs showing no leukocytosis, hemoglobin 10.5 (at baseline), glucose 272, bicarb 24, anion gap 11, creatinine 0.79, T. bili 1.5 (sample hemolyzed), transaminases and alkaline phosphatase normal, ethanol level <15, lactic acid 2.4> 3.1, COVID/influenza/RSV PCR negative, blood cultures in process, UDS negative.  UA cloudy in appearance with large amount of leukocytes and microscopy showing 11-20 RBCs, >50 WBCs, and many bacteria.  Urine culture in process.  Chest x-ray showing no acute findings.  Patient was given vancomycin , cefepime , metronidazole , and 2 L LR.

## 2023-09-20 NOTE — ED Notes (Addendum)
 Pt sleeping at this time. Very agitated did not assess oral temp at this time, will obtain temporal.

## 2023-09-20 NOTE — ED Triage Notes (Signed)
 Pt BIB EMS from  with concerns for AMS that started about 2 days ago. Per son, pt has had increased crying and hallucinations. Per EMS, pt had her duloxetine  dose was double last Thursday. Pt does have dementia. Pt was hypotensive for EMS in the 80s. Pt received 500cc of LR enroute.   20G L hand 86/68 HR 88 95% on RA CBG 322

## 2023-09-20 NOTE — Progress Notes (Signed)
 Pt being followed by [facility] for Sepsis protocol.

## 2023-09-21 ENCOUNTER — Observation Stay (HOSPITAL_COMMUNITY)

## 2023-09-21 DIAGNOSIS — Z7984 Long term (current) use of oral hypoglycemic drugs: Secondary | ICD-10-CM | POA: Diagnosis not present

## 2023-09-21 DIAGNOSIS — I10 Essential (primary) hypertension: Secondary | ICD-10-CM | POA: Diagnosis present

## 2023-09-21 DIAGNOSIS — I493 Ventricular premature depolarization: Secondary | ICD-10-CM | POA: Diagnosis present

## 2023-09-21 DIAGNOSIS — M797 Fibromyalgia: Secondary | ICD-10-CM | POA: Diagnosis present

## 2023-09-21 DIAGNOSIS — Z83438 Family history of other disorder of lipoprotein metabolism and other lipidemia: Secondary | ICD-10-CM | POA: Diagnosis not present

## 2023-09-21 DIAGNOSIS — R319 Hematuria, unspecified: Secondary | ICD-10-CM | POA: Diagnosis not present

## 2023-09-21 DIAGNOSIS — D649 Anemia, unspecified: Secondary | ICD-10-CM | POA: Diagnosis present

## 2023-09-21 DIAGNOSIS — R4182 Altered mental status, unspecified: Secondary | ICD-10-CM | POA: Diagnosis present

## 2023-09-21 DIAGNOSIS — Z79899 Other long term (current) drug therapy: Secondary | ICD-10-CM | POA: Diagnosis not present

## 2023-09-21 DIAGNOSIS — Z7901 Long term (current) use of anticoagulants: Secondary | ICD-10-CM | POA: Diagnosis not present

## 2023-09-21 DIAGNOSIS — E872 Acidosis, unspecified: Secondary | ICD-10-CM | POA: Diagnosis present

## 2023-09-21 DIAGNOSIS — I6782 Cerebral ischemia: Secondary | ICD-10-CM | POA: Diagnosis not present

## 2023-09-21 DIAGNOSIS — Z823 Family history of stroke: Secondary | ICD-10-CM | POA: Diagnosis not present

## 2023-09-21 DIAGNOSIS — N390 Urinary tract infection, site not specified: Secondary | ICD-10-CM

## 2023-09-21 DIAGNOSIS — E1165 Type 2 diabetes mellitus with hyperglycemia: Secondary | ICD-10-CM | POA: Diagnosis present

## 2023-09-21 DIAGNOSIS — F0393 Unspecified dementia, unspecified severity, with mood disturbance: Secondary | ICD-10-CM | POA: Diagnosis present

## 2023-09-21 DIAGNOSIS — E039 Hypothyroidism, unspecified: Secondary | ICD-10-CM | POA: Diagnosis present

## 2023-09-21 DIAGNOSIS — Z1152 Encounter for screening for COVID-19: Secondary | ICD-10-CM | POA: Diagnosis not present

## 2023-09-21 DIAGNOSIS — Z79890 Hormone replacement therapy (postmenopausal): Secondary | ICD-10-CM | POA: Diagnosis not present

## 2023-09-21 DIAGNOSIS — Z8249 Family history of ischemic heart disease and other diseases of the circulatory system: Secondary | ICD-10-CM | POA: Diagnosis not present

## 2023-09-21 DIAGNOSIS — E785 Hyperlipidemia, unspecified: Secondary | ICD-10-CM | POA: Diagnosis present

## 2023-09-21 DIAGNOSIS — G9341 Metabolic encephalopathy: Secondary | ICD-10-CM | POA: Diagnosis present

## 2023-09-21 DIAGNOSIS — Z794 Long term (current) use of insulin: Secondary | ICD-10-CM | POA: Diagnosis not present

## 2023-09-21 DIAGNOSIS — F03911 Unspecified dementia, unspecified severity, with agitation: Secondary | ICD-10-CM | POA: Diagnosis present

## 2023-09-21 DIAGNOSIS — F0392 Unspecified dementia, unspecified severity, with psychotic disturbance: Secondary | ICD-10-CM | POA: Diagnosis present

## 2023-09-21 DIAGNOSIS — I480 Paroxysmal atrial fibrillation: Secondary | ICD-10-CM | POA: Diagnosis present

## 2023-09-21 DIAGNOSIS — F32A Depression, unspecified: Secondary | ICD-10-CM | POA: Diagnosis present

## 2023-09-21 LAB — COMPREHENSIVE METABOLIC PANEL WITH GFR
ALT: 18 U/L (ref 0–44)
AST: 23 U/L (ref 15–41)
Albumin: 2.8 g/dL — ABNORMAL LOW (ref 3.5–5.0)
Alkaline Phosphatase: 71 U/L (ref 38–126)
Anion gap: 11 (ref 5–15)
BUN: 12 mg/dL (ref 8–23)
CO2: 22 mmol/L (ref 22–32)
Calcium: 8.8 mg/dL — ABNORMAL LOW (ref 8.9–10.3)
Chloride: 105 mmol/L (ref 98–111)
Creatinine, Ser: 0.76 mg/dL (ref 0.44–1.00)
GFR, Estimated: >60 mL/min (ref >60)
Glucose, Bld: 285 mg/dL — ABNORMAL HIGH (ref 70–99)
Potassium: 3.8 mmol/L (ref 3.5–5.1)
Sodium: 138 mmol/L (ref 135–145)
Total Bilirubin: 0.8 mg/dL (ref 0.0–1.2)
Total Protein: 6.2 g/dL — ABNORMAL LOW (ref 6.5–8.1)

## 2023-09-21 LAB — CBC
HCT: 31.6 % — ABNORMAL LOW (ref 36.0–46.0)
Hemoglobin: 10.1 g/dL — ABNORMAL LOW (ref 12.0–15.0)
MCH: 29.8 pg (ref 26.0–34.0)
MCHC: 32 g/dL (ref 30.0–36.0)
MCV: 93.2 fL (ref 80.0–100.0)
Platelets: 249 K/uL (ref 150–400)
RBC: 3.39 MIL/uL — ABNORMAL LOW (ref 3.87–5.11)
RDW: 15.6 % — ABNORMAL HIGH (ref 11.5–15.5)
WBC: 8 K/uL (ref 4.0–10.5)
nRBC: 0 % (ref 0.0–0.2)

## 2023-09-21 LAB — GLUCOSE, CAPILLARY
Glucose-Capillary: 279 mg/dL — ABNORMAL HIGH (ref 70–99)
Glucose-Capillary: 242 mg/dL — ABNORMAL HIGH (ref 70–99)
Glucose-Capillary: 169 mg/dL — ABNORMAL HIGH (ref 70–99)
Glucose-Capillary: 245 mg/dL — ABNORMAL HIGH (ref 70–99)
Glucose-Capillary: 223 mg/dL — ABNORMAL HIGH (ref 70–99)

## 2023-09-21 LAB — TSH: TSH: 1.864 u[IU]/mL (ref 0.350–4.500)

## 2023-09-21 LAB — LACTIC ACID, PLASMA: Lactic Acid, Venous: 2.9 mmol/L (ref 0.5–1.9)

## 2023-09-21 LAB — MRSA NEXT GEN BY PCR, NASAL: MRSA by PCR Next Gen: DETECTED — AB

## 2023-09-21 MED ORDER — MUPIROCIN 2 % EX OINT
1.0000 | TOPICAL_OINTMENT | Freq: Two times a day (BID) | CUTANEOUS | Status: AC
  Administered 2023-09-21 – 2023-09-25 (×10): 1 via NASAL
  Filled 2023-09-21: qty 22

## 2023-09-21 MED ORDER — APIXABAN 5 MG PO TABS
5.0000 mg | ORAL_TABLET | Freq: Two times a day (BID) | ORAL | Status: DC
  Administered 2023-09-21 – 2023-09-26 (×11): 5 mg via ORAL
  Filled 2023-09-21 (×11): qty 1

## 2023-09-21 MED ORDER — INSULIN ASPART 100 UNIT/ML IJ SOLN
0.0000 [IU] | Freq: Three times a day (TID) | INTRAMUSCULAR | Status: DC
  Administered 2023-09-21: 2 [IU] via SUBCUTANEOUS
  Administered 2023-09-21 (×2): 3 [IU] via SUBCUTANEOUS
  Administered 2023-09-22: 5 [IU] via SUBCUTANEOUS
  Administered 2023-09-22: 1 [IU] via SUBCUTANEOUS
  Administered 2023-09-22: 3 [IU] via SUBCUTANEOUS
  Administered 2023-09-23: 5 [IU] via SUBCUTANEOUS
  Administered 2023-09-23: 9 [IU] via SUBCUTANEOUS
  Administered 2023-09-23 – 2023-09-24 (×2): 7 [IU] via SUBCUTANEOUS
  Administered 2023-09-24: 5 [IU] via SUBCUTANEOUS
  Administered 2023-09-24: 2 [IU] via SUBCUTANEOUS
  Administered 2023-09-25: 7 [IU] via SUBCUTANEOUS
  Administered 2023-09-25: 3 [IU] via SUBCUTANEOUS
  Administered 2023-09-26: 2 [IU] via SUBCUTANEOUS
  Administered 2023-09-26: 3 [IU] via SUBCUTANEOUS

## 2023-09-21 MED ORDER — SODIUM CHLORIDE 0.9 % IV SOLN
1.0000 g | INTRAVENOUS | Status: DC
  Administered 2023-09-21 – 2023-09-23 (×3): 1 g via INTRAVENOUS
  Filled 2023-09-21 (×3): qty 10

## 2023-09-21 MED ORDER — INSULIN ASPART 100 UNIT/ML IJ SOLN
0.0000 [IU] | Freq: Every day | INTRAMUSCULAR | Status: DC
  Administered 2023-09-21: 3 [IU] via SUBCUTANEOUS
  Administered 2023-09-21: 2 [IU] via SUBCUTANEOUS
  Administered 2023-09-22: 4 [IU] via SUBCUTANEOUS
  Administered 2023-09-23: 3 [IU] via SUBCUTANEOUS

## 2023-09-21 MED ORDER — ACETAMINOPHEN 325 MG PO TABS: 650.0000 mg | ORAL_TABLET | Freq: Four times a day (QID) | ORAL | Status: DC | PRN

## 2023-09-21 MED ORDER — CHLORHEXIDINE GLUCONATE CLOTH 2 % EX PADS
6.0000 | MEDICATED_PAD | Freq: Every day | CUTANEOUS | Status: AC
  Administered 2023-09-21 – 2023-09-25 (×5): 6 via TOPICAL

## 2023-09-21 MED ORDER — SODIUM CHLORIDE 0.9 % IV SOLN: INTRAVENOUS | Status: AC

## 2023-09-21 MED ORDER — ACETAMINOPHEN 650 MG RE SUPP: 650.0000 mg | Freq: Four times a day (QID) | RECTAL | Status: DC | PRN

## 2023-09-21 NOTE — TOC CM/SW Note (Signed)
 Transition of Care [facility]) - Inpatient Brief Assessment   Patient Details  Name: Jaclyn [name] MRN: [id number] Date of Birth: 1935-12-30  Transition of Care [facility]) CM/SW Contact:    Roxie KANDICE Stain, RN Phone Number: 09/21/2023, 2:31 PM   Clinical Narrative:  Patient from [facility] and can return when medically ready per facility.  Transition of Care Asessment: Insurance and Status: Insurance coverage has been reviewed Patient has primary care physician: Yes ([facility]) Home environment has been reviewed: lives @ [facility]: No current home services Social Drivers of Health Review: SDOH reviewed no interventions necessary Readmission risk has been reviewed: Yes Transition of care needs: transition of care needs identified, TOC will continue to follow

## 2023-09-21 NOTE — ED Notes (Signed)
 Pt transported to CT ?

## 2023-09-21 NOTE — Progress Notes (Signed)
 PROGRESS NOTE  Jaclyn [name]  FMW:[id number] DOB: 01-Aug-1935 DOA: 09/20/2023 PCP: System, Provider Not In  Consultants  Brief Narrative: Jaclyn [name] is a 88 y.o. 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  Patient is confused and not able to give any history.  Son at bedside states patient states at a nursing facility and has been very confused for the past few days.  She has been combative and hallucinating.  Son states this has happening every time patient has had a UTI in the past.  States patient was feeling sad so her nursing facility had doubled the dose of duloxetine  from 30 mg daily to 60 mg daily to help with depression 4 days ago.  Son states patient's confusion started the following day.  He also reports patient having history of stroke in the past.  ED Course: Patient was hypotensive with EMS with SBP in the 80s and received 500 mL IV fluids.  In the ED, patient noted to be hypotensive again with SBP as low as 70s but remainder of vital signs stable.  Labs showing no leukocytosis, hemoglobin 10.5 (at baseline), glucose 272, bicarb 24, anion gap 11, creatinine 0.79, T. bili 1.5 (sample hemolyzed), transaminases and alkaline phosphatase normal, ethanol level <15, lactic acid 2.4, COVID/influenza/RSV PCR negative, blood cultures in process, UDS negative.  UA cloudy in appearance with large amount of leukocytes and microscopy showing 11-20 RBCs, >50 WBCs, and many bacteria.  Urine culture in process.  Chest x-ray showing no acute findings.  Patient was given vancomycin , cefepime , metronidazole , and 1 L LR.  Hypotension resolved but repeat lactate 3.1.  Additional 1 L IV fluids ordered.      Assessment & Plan: UTI w/  sepsis - Patient with history of recurrent UTIs in the past presenting with altered mental status/confusion.  UA with evidence of pyuria and bacteriuria.  Patient hypotensive initially and lactate elevated.  No fever, leukocytosis, or tachycardia.   - Hypotension now resolved.  - Continue IV fluid hydration and trend lactate.   - Continue antibiotic coverage with ceftriaxone .  Follow-up urine and blood cultures. - Altered mental status this AM, but was able to sit up and participate/eat lunch this afternoon.     Acute metabolic encephalopathy - deemed likely secondary to UTI.  UDS negative.  Son reports this is typical presentation when patient has UTI - Remained altered this morning.  Able to sit up and at lunch however stay progressed.  Oriented to person, birthdate, not to place--better than admission. - Reportedly dose of home duloxetine  was doubled 4 days ago but no signs of serotonin syndrome.   - Patient is on chronic anticoagulation and history of previous stroke/TIA.  CT head negative - TSH normal  - Holding duloxetine  at this time.   Paroxysmal A-fib Currently in sinus rhythm.   - Holding Eliquis  until CT head is done.-->  CT head negative, restart Eliquis    Insulin -dependent type 2 diabetes Glucose 272 without signs of DKA.  Last A1c 8.4 in October 2024, repeat ordered.  Pharmacy med rec pending.   - Placed on sensitive sliding scale insulin .  Following CBGs   Chronic normocytic anemia Hemoglobin at baseline.   Hypothyroidism: Check TSH Dementia: Delirium precautions.    DVT prophylaxis:   apixaban  (ELIQUIS ) tablet  5 mg  Code Status:   Code Status: Full Code Family Communication: Son at bedside all questions answered Level of care: Progressive Status is: Inpatient   Subjective: Patient initially sleepy and arousable only to sternal rub.  However as day progressed she was able to sit up and eat lunch.  More oriented and awake at that point  Objective: Vitals:    09/21/23 0614 09/21/23 0736 09/21/23 1105 09/21/23 1559  BP: (!) 141/51 (!) 121/55 (!) 132/51 (!) 138/51  Pulse: 80 76 74 82  Resp: 20 18 19 15   Temp: 98.6 F (37 C) 98.6 F (37 C) 98.6 F (37 C) 98.8 F (37.1 C)  TempSrc: Oral Oral Axillary Axillary  SpO2: 92% 95% 96% 97%  Weight:      Height:        Intake/Output Summary (Last 24 hours) at 09/21/2023 1645 Last data filed at 09/21/2023 1601 Gross per 24 hour  Intake 1534.89 ml  Output 1300 ml  Net 234.89 ml   Filed Weights   09/21/23 0129  Weight: 65.7 kg   Body mass index is 25.66 kg/m.  Gen: 88 y.o. female in no apparent distress.  Nontoxic appearing, sleepy this AM, but more awake at lunch  Pulm: Non-labored breathing.  Clear to auscultation bilaterally.  CV: Regular rate and rhythm.  GI: Abdomen soft, non-tender, non-distended Ext: Warm, no deformities Skin: No rashes, lesions s Neuro: Initially groggy, later in the day she was more alert and oriented to person and birthdate but not place or current date.  No focal neurological deficits, moving all extremities symmetrically. Psych: Calm  Judgement and insight appear normal. Mood & affect appropriate.     I have personally reviewed the following labs and images: CBC: Recent Labs  Lab 09/20/23 2001 09/21/23 0228  WBC 8.0 8.0  NEUTROABS 5.1  --   HGB 10.5* 10.1*  HCT 32.6* 31.6*  MCV 94.2 93.2  PLT 267 249   BMP &GFR Recent Labs  Lab 09/20/23 2001 09/21/23 0228  NA 138 138  K 4.9 3.8  CL 103 105  CO2 24 22  GLUCOSE 272* 285*  BUN 13 12  CREATININE 0.79 0.76  CALCIUM  8.9 8.8*   Estimated Creatinine Clearance: 46 mL/min (by C-G formula based on SCr of 0.76 mg/dL). Liver & Pancreas: Recent Labs  Lab 09/20/23 2001 09/21/23 0228  AST 34 23  ALT 16 18  ALKPHOS 71 71  BILITOT 1.5* 0.8  PROT 6.2* 6.2*  ALBUMIN 2.8* 2.8*   No results for input(s): LIPASE, AMYLASE in the last 168 hours. No results for input(s): AMMONIA in the last 168  hours. Diabetic: No results for input(s): HGBA1C in the last 72 hours. Recent Labs  Lab 09/21/23 0143 09/21/23 0609 09/21/23 1103 09/21/23 1557  GLUCAP 279* 242* 169* 245*   Cardiac Enzymes: No results for input(s): CKTOTAL, CKMB, CKMBINDEX, TROPONINI in the last 168 hours. No results for input(s): PROBNP in the last 8760 hours. Coagulation Profile: Recent Labs  Lab 09/20/23 2019  INR 1.3*   Thyroid  Function Tests: Recent Labs    09/21/23 0228  TSH 1.864   Lipid Profile: No results for input(s): CHOL, HDL, LDLCALC, TRIG, CHOLHDL, LDLDIRECT in the last 72 hours. Anemia Panel: No results for input(s): VITAMINB12, FOLATE, FERRITIN, TIBC, IRON, RETICCTPCT in the last 72 hours. Urine analysis:    Component Value Date/Time   COLORURINE YELLOW 09/20/2023 2103   APPEARANCEUR CLOUDY (A) 09/20/2023 2103   LABSPEC 1.006 09/20/2023 2103   PHURINE  6.0 09/20/2023 2103   GLUCOSEU NEGATIVE 09/20/2023 2103   GLUCOSEU >=1000 (A) 03/16/2019 0915   HGBUR SMALL (A) 09/20/2023 2103   BILIRUBINUR NEGATIVE 09/20/2023 2103   BILIRUBINUR Negative 12/25/2020 1243   KETONESUR NEGATIVE 09/20/2023 2103   PROTEINUR NEGATIVE 09/20/2023 2103   UROBILINOGEN 0.2 12/25/2020 1243   UROBILINOGEN 0.2 03/16/2019 0915   NITRITE NEGATIVE 09/20/2023 2103   LEUKOCYTESUR LARGE (A) 09/20/2023 2103   Sepsis Labs: Invalid input(s): PROCALCITONIN, LACTICIDVEN  Microbiology: Recent Results (from the past 240 hours)  Resp panel by RT-PCR (RSV, Flu A&B, Covid) Anterior Nasal Swab     Status: None   Collection Time: 09/20/23  8:43 PM   Specimen: Anterior Nasal Swab  Result Value Ref Range Status   SARS Coronavirus 2 by RT PCR NEGATIVE NEGATIVE Final   Influenza A by PCR NEGATIVE NEGATIVE Final   Influenza B by PCR NEGATIVE NEGATIVE Final    Comment: (NOTE) The Xpert Xpress SARS-CoV-2/FLU/RSV plus assay is intended as an aid in the diagnosis of influenza from  Nasopharyngeal swab specimens and should not be used as a sole basis for treatment. Nasal washings and aspirates are unacceptable for Xpert Xpress SARS-CoV-2/FLU/RSV testing.  Fact Sheet for Patients: BloggerCourse.com  Fact Sheet for Healthcare Providers: SeriousBroker.it  This test is not yet approved or cleared by the [place]  FDA and has been authorized for detection and/or diagnosis of SARS-CoV-2 by FDA under an Emergency Use Authorization (EUA). This EUA will remain in effect (meaning this test can be used) for the duration of the COVID-19 declaration under Section 564(b)(1) of the Act, 21 U.S.C. section 360bbb-3(b)(1), unless the authorization is terminated or revoked.     Resp Syncytial Virus by PCR NEGATIVE NEGATIVE Final    Comment: (NOTE) Fact Sheet for Patients: BloggerCourse.com  Fact Sheet for Healthcare Providers: SeriousBroker.it  This test is not yet approved or cleared by the [place]  FDA and has been authorized for detection and/or diagnosis of SARS-CoV-2 by FDA under an Emergency Use Authorization (EUA). This EUA will remain in effect (meaning this test can be used) for the duration of the COVID-19 declaration under Section 564(b)(1) of the Act, 21 U.S.C. section 360bbb-3(b)(1), unless the authorization is terminated or revoked.  Performed at [facility], [address], [place], [place] [address]   Blood Culture (routine x 2)     Status: None (Preliminary result)   Collection Time: 09/20/23  9:00 PM   Specimen: BLOOD LEFT FOREARM  Result Value Ref Range Status   Specimen Description BLOOD LEFT FOREARM  Final   Special Requests   Final    BOTTLES DRAWN AEROBIC AND ANAEROBIC Blood Culture adequate volume   Culture   Final    NO GROWTH < 12 HOURS Performed at [facility], [address], [place], [place] [address]    Report  Status PENDING  Incomplete  Blood Culture (routine x 2)     Status: None (Preliminary result)   Collection Time: 09/20/23  9:10 PM   Specimen: BLOOD RIGHT FOREARM  Result Value Ref Range Status   Specimen Description BLOOD RIGHT FOREARM  Final   Special Requests   Final    BOTTLES DRAWN AEROBIC AND ANAEROBIC Blood Culture adequate volume   Culture   Final    NO GROWTH < 12 HOURS Performed at [facility], [address], [place], [place] [address]    Report Status PENDING  Incomplete  MRSA Next Gen by PCR, Nasal  Status: Abnormal   Collection Time: 09/21/23  1:30 AM   Specimen: Nasal Mucosa; Nasal Swab  Result Value Ref Range Status   MRSA by PCR Next Gen DETECTED (A) NOT DETECTED Final    Comment: RESULT CALLED TO, READ BACK BY AND VERIFIED WITH: RN CARMELITA CROME [id number] FCP (NOTE) The GeneXpert MRSA Assay (FDA approved for NASAL specimens only), is one component of a comprehensive MRSA colonization surveillance program. It is not intended to diagnose MRSA infection nor to guide or monitor treatment for MRSA infections. Test performance is not FDA approved in patients less than 20 years old. Performed at [facility], [address], [place], [place] [address]     Radiology Studies: CT HEAD WO CONTRAST ( ) Result Date: 09/21/2023 EXAM: CT HEAD WITHOUT 09/21/2023 01:09:00 AM TECHNIQUE: CT of the head was performed without the administration of intravenous contrast. Automated exposure control, iterative reconstruction, and/or weight based adjustment of the mA/kV was utilized to reduce the radiation dose to as low as reasonably achievable. COMPARISON: 11/22/2022 CLINICAL HISTORY: Delirium. Chief complaints; Altered Mental Status. FINDINGS: BRAIN AND VENTRICLES: No acute intracranial hemorrhage. No mass effect or midline shift. No extra-axial fluid collection. Gray-white differentiation is maintained. No hydrocephalus. Chronic microvascular ischemia and generalized  atrophy. ORBITS: No acute abnormality. SINUSES AND MASTOIDS: No acute abnormality. SOFT TISSUES AND SKULL: No acute skull fracture. No acute soft tissue abnormality. IMPRESSION: 1. No acute intracranial abnormality. Electronically signed by: Norman Gatlin MD 09/21/2023 01:32 AM EDT RP Workstation: [id number]   DG Chest Port 1 View Result Date: 09/20/2023 EXAM: 1 VIEW XRAY OF THE CHEST 09/20/2023 08:33:00 PM COMPARISON: 11/22/2022 CLINICAL HISTORY: Questionable sepsis - evaluate for abnormality. FINDINGS: LUNGS AND PLEURA: No focal pulmonary opacity. No pulmonary edema. No pleural effusion. No pneumothorax. HEART AND MEDIASTINUM: Stable cardiomediastinal silhouette. Aortic atherosclerotic calcification. BONES AND SOFT TISSUES: No acute osseous abnormality. IMPRESSION: 1. No acute findings. Electronically signed by: Norman Gatlin MD 09/20/2023 08:50 PM EDT RP Workstation: [id number]    Scheduled Meds:  apixaban   5 mg Oral BID   Chlorhexidine  Gluconate Cloth  6 each Topical Daily   insulin  aspart  0-5 Units Subcutaneous QHS   insulin  aspart  0-9 Units Subcutaneous TID WC   mupirocin  ointment  1 Application Nasal BID   Continuous Infusions:  cefTRIAXone  (ROCEPHIN )  IV 200 mL/hr at 09/21/23 0916     LOS: 0 days   35 minutes with more than 50% spent in reviewing records, counseling patient/family and coordinating care.  Reyes VEAR Gaw, MD [facility] [email or web address] 09/21/2023, 4:45 PM

## 2023-09-21 NOTE — H&P (Signed)
 History and Physical    Jaclyn [name]:[id number] DOB: Dec 10, 1935 DOA: 09/20/2023  PCP: System, Provider Not In  Chief Complaint: AMS  HPI: Jaclyn [name] is a 88 y.o. 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  Patient is confused and not able to give any history.  Son at bedside states patient states at a nursing facility and has been very confused for the past few days.  She has been combative and hallucinating.  Son states this has happening every time patient has had a UTI in the past.  States patient was feeling sad so her nursing facility had doubled the dose of duloxetine  from 30 mg daily to 60 mg daily to help with depression 4 days ago.  Son states patient's confusion started the following day.  He also reports patient having history of stroke in the past.  ED Course: Patient was hypotensive with EMS with SBP in the 80s and received 500 mL IV fluids.  In the ED, patient noted to be hypotensive again with SBP as low as 70s but remainder of vital signs stable.  Labs showing no leukocytosis, hemoglobin 10.5 (at baseline), glucose 272, bicarb 24, anion gap 11, creatinine 0.79, T. bili 1.5 (sample hemolyzed), transaminases and alkaline phosphatase normal, ethanol level <15, lactic acid 2.4, COVID/influenza/RSV PCR negative, blood cultures in process, UDS negative.  UA cloudy in appearance with large amount of leukocytes and microscopy showing 11-20 RBCs, >50 WBCs, and many bacteria.  Urine culture in process.  Chest x-ray showing no acute findings.  Patient was given vancomycin , cefepime , metronidazole , and 1 L LR.  Hypotension resolved but repeat lactate 3.1.  Additional 1 L IV fluids ordered.   Review of Systems:  Review  of Systems  All other systems reviewed and are negative.   Past Medical History:  Diagnosis Date   Atrial fibrillation (HCC)    Benign neoplasm of colon    Carotid artery occlusion    60-79% right ICA stenosis   Dementia (HCC)    Difficult intubation 02/17/2006   During surgery to remove large polyp   Diverticulosis of colon (without mention of hemorrhage)    Dysthymic disorder    Fibromyalgia    Headache(784.0)    HOH (hard of hearing)    Insomnia, unspecified    Interstitial cystitis    Osteoarthrosis, unspecified whether generalized or localized, unspecified site    Other and unspecified hyperlipidemia    Other chest pain    Other specified benign mammary dysplasias    SunDown syndrome    pt can get agressive and combative during the night   TIA (transient ischemic attack)    Type II or unspecified type diabetes mellitus without mention of complication, not stated as uncontrolled    Unspecified essential hypertension    Unspecified hypothyroidism    Unspecified vitamin D  deficiency    Urinary tract infection, site not specified     Past Surgical History:  Procedure Laterality Date   ABDOMINAL HYSTERECTOMY  1988   cervical dysplasia   CARDIAC CATHETERIZATION  02/19/06   EF 60%   COLONOSCOPY     ENDARTERECTOMY Right 08/26/2019   Procedure: RIGHT CAROTID ENDARTERECTOMY;  Surgeon: Oris Krystal FALCON, MD;  Location: [facility] OR;  Service: Vascular;  Laterality: Right;   PATCH ANGIOPLASTY  Right 08/26/2019   Procedure: PATCH ANGIOPLASTY OF RIGHT COMMON CAROTID ARTERY USING HEMASHIELD PLATINUM FINESSE PATCH;  Surgeon: Oris Krystal FALCON, MD;  Location: [facility] OR;  Service: Vascular;  Laterality: Right;   RIGHT COLECTOMY  2005   for villous adenoma of the cecum Dr.streck   VESICOVAGINAL FISTULA CLOSURE W/ TAH  1990   w/ cystocele &retocele repairs Dr. Kiki     reports that she has never smoked. She has never used smokeless tobacco. She reports that she does not drink alcohol and does not use  drugs.  Allergies  Allergen Reactions   Naproxen Sodium Other (See Comments)    Fever/aches and pains   Statins Other (See Comments)    myalgias   Sulfonamide Derivatives Hives and Itching    Not documented on MAR   Latex Itching and Rash   Shellfish Allergy Itching, Swelling and Rash    Seafood, shrimp    Family History  Problem Relation Age of Onset   Lymphoma Father    Hypertension Father    Stroke Father    Hyperlipidemia Father    Hypertension Mother    Allergies Brother    Hypertension Sister        3   Breast cancer Sister    Fibromyalgia Sister        3   Hyperlipidemia Sister        3    Prior to Admission medications   Medication Sig Start Date End Date Taking? Authorizing Provider  Cholecalciferol  (VITAMIN D3) 1.25 MG (50000 UT) CAPS Take 1 capsule by mouth once a week. 12/27/20   Bedsole, Amy E, MD  cyanocobalamin  1000 MCG tablet Take 2,000 mcg by mouth daily.    [provider]  docusate sodium  (COLACE) 100 MG capsule Take 100 mg by mouth 2 (two) times daily.    [provider]  DULoxetine  (CYMBALTA ) 30 MG capsule Take 30 mg by mouth daily. 01/13/22   [provider]  ELIQUIS  2.5 MG TABS tablet Take 2.5 mg by mouth 2 (two) times daily. 11/01/21   [provider]  guaiFENesin  (MUCINEX ) 600 MG 12 hr tablet Take 600 mg by mouth 2 (two) times daily as needed for cough.    [provider]  HUMALOG KWIKPEN 100 UNIT/ML KwikPen Inject 0-12 Units into the skin in the morning, at noon, in the evening, and at bedtime. Before meals and at bedtime 01/13/22   [provider]  insulin  lispro (HUMALOG) 100 UNIT/ML injection Inject 3 Units into the skin 3 (three) times daily before meals.    [provider]  JANUVIA  100 MG tablet TAKE 1 TABLET BY MOUTH EVERY DAY 01/07/21   Bedsole, Amy E, MD  LANTUS  SOLOSTAR 100 UNIT/ML Solostar Pen Inject 18 Units into the skin at bedtime. 07/31/22   [provider]   levothyroxine  (SYNTHROID ) 75 MCG tablet Take 75 mcg by mouth daily. 11/14/22   [provider]  magnesium  oxide (MAG-OX) 400 (240 Mg) MG tablet Take 800 mg by mouth 2 (two) times daily.    [provider]  metFORMIN  (GLUCOPHAGE ) 1000 MG tablet Take 1,000 mg by mouth 2 (two) times daily. 10/28/21   [provider]  Omega-3 Fatty Acids (FISH OIL) 1000 MG CAPS Take 1,000 mg by mouth daily.    [provider]  polyethylene glycol (MIRALAX  / GLYCOLAX ) 17 g packet Take 17 g by mouth daily as needed for mild constipation or moderate constipation. 11/28/22   Caleen Burgess BROCKS, MD  Physical Exam: Vitals:   09/20/23 2035 09/20/23 2040 09/20/23 2110 09/21/23 0000  BP: 103/81 90/77 109/67   Pulse: 90 88 95   Resp: 17 19 16    Temp:    98.1 F (36.7 C)  TempSrc:    Temporal  SpO2: 100% 100% 98%     Physical Exam Vitals reviewed.  Constitutional:      General: She is not in acute distress. HENT:     Head: Normocephalic and atraumatic.     Mouth/Throat:     Mouth: Mucous membranes are dry.  Eyes:     Extraocular Movements: Extraocular movements intact.  Cardiovascular:     Rate and Rhythm: Normal rate and regular rhythm.     Pulses: Normal pulses.  Pulmonary:     Effort: Pulmonary effort is normal. No respiratory distress.     Breath sounds: Normal breath sounds. No wheezing, rhonchi or rales.  Abdominal:     General: Bowel sounds are normal. There is no distension.     Palpations: Abdomen is soft.     Tenderness: There is no abdominal tenderness. There is no guarding.  Musculoskeletal:     Cervical back: Normal range of motion. No rigidity.     Right lower leg: No edema.     Left lower leg: No edema.  Skin:    General: Skin is warm and dry.  Neurological:     General: No focal deficit present.     Mental Status: She is alert.     Cranial Nerves: No cranial nerve deficit.     Sensory: No sensory deficit.     Motor: No weakness.     Comments:  Following commands appropriately, no focal neurodeficit     Labs on Admission: I have personally reviewed following labs and imaging studies  CBC: Recent Labs  Lab 09/20/23 2001  WBC 8.0  NEUTROABS 5.1  HGB 10.5*  HCT 32.6*  MCV 94.2  PLT 267   Basic Metabolic Panel: Recent Labs  Lab 09/20/23 2001  NA 138  K 4.9  CL 103  CO2 24  GLUCOSE 272*  BUN 13  CREATININE 0.79  CALCIUM  8.9   GFR: CrCl cannot be calculated (Unknown ideal weight.). Liver Function Tests: Recent Labs  Lab 09/20/23 2001  AST 34  ALT 16  ALKPHOS 71  BILITOT 1.5*  PROT 6.2*  ALBUMIN 2.8*   No results for input(s): LIPASE, AMYLASE in the last 168 hours. No results for input(s): AMMONIA in the last 168 hours. Coagulation Profile: Recent Labs  Lab 09/20/23 2019  INR 1.3*   Cardiac Enzymes: No results for input(s): CKTOTAL, CKMB, CKMBINDEX, TROPONINI in the last 168 hours. BNP (last 3 results) No results for input(s): PROBNP in the last 8760 hours. HbA1C: No results for input(s): HGBA1C in the last 72 hours. CBG: No results for input(s): GLUCAP in the last 168 hours. Lipid Profile: No results for input(s): CHOL, HDL, LDLCALC, TRIG, CHOLHDL, LDLDIRECT in the last 72 hours. Thyroid  Function Tests: No results for input(s): TSH, T4TOTAL, FREET4, T3FREE, THYROIDAB in the last 72 hours. Anemia Panel: No results for input(s): VITAMINB12, FOLATE, FERRITIN, TIBC, IRON, RETICCTPCT in the last 72 hours. Urine analysis:    Component Value Date/Time   COLORURINE YELLOW 09/20/2023 2103   APPEARANCEUR CLOUDY (A) 09/20/2023 2103   LABSPEC 1.006 09/20/2023 2103   PHURINE 6.0 09/20/2023 2103   GLUCOSEU NEGATIVE 09/20/2023 2103   GLUCOSEU >=1000 (A) 03/16/2019 0915   HGBUR SMALL (A) 09/20/2023 2103  BILIRUBINUR NEGATIVE 09/20/2023 2103   BILIRUBINUR Negative 12/25/2020 1243   KETONESUR NEGATIVE 09/20/2023 2103   PROTEINUR NEGATIVE 09/20/2023  2103   UROBILINOGEN 0.2 12/25/2020 1243   UROBILINOGEN 0.2 03/16/2019 0915   NITRITE NEGATIVE 09/20/2023 2103   LEUKOCYTESUR LARGE (A) 09/20/2023 2103    Radiological Exams on Admission: DG Chest Port 1 View Result Date: 09/20/2023 EXAM: 1 VIEW XRAY OF THE CHEST 09/20/2023 08:33:00 PM COMPARISON: 11/22/2022 CLINICAL HISTORY: Questionable sepsis - evaluate for abnormality. FINDINGS: LUNGS AND PLEURA: No focal pulmonary opacity. No pulmonary edema. No pleural effusion. No pneumothorax. HEART AND MEDIASTINUM: Stable cardiomediastinal silhouette. Aortic atherosclerotic calcification. BONES AND SOFT TISSUES: No acute osseous abnormality. IMPRESSION: 1. No acute findings. Electronically signed by: Norman Gatlin MD 09/20/2023 08:50 PM EDT RP Workstation: [id number]    EKG: Independently reviewed. Sinus rhythm, PVC, baseline wander in V3 and V6.   Assessment and Plan  UTI ?Sepsis Patient with history of recurrent UTIs in the past presenting with altered mental status/confusion.  UA with evidence of pyuria and bacteriuria.  Patient hypotensive initially and lactate elevated.  No fever, leukocytosis, or tachycardia.  Hypotension has resolved after IV fluid boluses.  Continue IV fluid hydration and trend lactate.  Continue antibiotic coverage with ceftriaxone .  Follow-up urine and blood cultures.  Acute metabolic encephalopathy Likely secondary to UTI.  UDS negative.  Reportedly dose of home duloxetine  was doubled 4 days ago but no signs of serotonin syndrome.  Patient is on chronic anticoagulation and history of previous stroke/TIA.  Stat CT head ordered.  Check TSH level.  Hold duloxetine  at this time.  Paroxysmal A-fib Currently in sinus rhythm.  Holding Eliquis  until CT head is done.  Insulin -dependent type 2 diabetes Glucose 272 without signs of DKA.  Last A1c 8.4 in October 2024, repeat ordered.  Pharmacy med rec pending.  Placed on sensitive sliding scale insulin .  Chronic normocytic  anemia Hemoglobin at baseline.  Hypothyroidism: Check TSH Dementia: Delirium precautions. Pharmacy med rec pending.  DVT prophylaxis: Holding Eliquis  until CT head is done. Code Status: Full Code (discussed with the patient's son) Family Communication: Son at bedside. Level of care: Progressive Care Unit Admission status: It is my clinical opinion that referral for OBSERVATION is reasonable and necessary in this patient based on the above information provided. The aforementioned taken together are felt to place the patient at high risk for further clinical deterioration. However, it is anticipated that the patient may be medically stable for discharge from the hospital within 24 to 48 hours.  Editha Ram MD [facility]  If 7PM-7AM, please contact night-coverage [email or web address]  09/21/2023, 12:31 AM

## 2023-09-21 NOTE — Plan of Care (Signed)

## 2023-09-21 NOTE — Inpatient Diabetes Management (Signed)
 Inpatient Diabetes Program Recommendations  AACE/ADA: New Consensus Statement on Inpatient Glycemic Control (2025)  Target Ranges:  Prepandial:   less than 140 mg/dL      Peak postprandial:   less than 180 mg/dL (1-2 hours)      Critically ill patients:  140 - 180 mg/dL   Lab Results  Component Value Date   GLUCAP 242 (H) 09/21/2023   HGBA1C 8.4 (H) 11/23/2022    Review of Glycemic Control  Latest Reference Range & Units 09/21/23 01:43 09/21/23 06:09  Glucose-Capillary 70 - 99 mg/dL 720 (H) 757 (H)   Diabetes history: DM 2 Outpatient Diabetes medications:  Metformin  1000 mg bid Lantus  18 units q HS Januvia  100 mg daily Current orders for Inpatient glycemic control:  Novolog  0-9 units tid with meals and HS  Inpatient Diabetes Program Recommendations:    Please consider adding Semglee  18 units daily (this is home dose of basal insulin ).   Thanks,  Randall Bullocks, RN, BC-ADM Inpatient Diabetes Coordinator Pager [phone]  (8a-5p)

## 2023-09-21 NOTE — ED Notes (Signed)
 Called floor to advise pt in route, floor receptive.

## 2023-09-22 DIAGNOSIS — N390 Urinary tract infection, site not specified: Secondary | ICD-10-CM | POA: Diagnosis not present

## 2023-09-22 DIAGNOSIS — R319 Hematuria, unspecified: Secondary | ICD-10-CM | POA: Diagnosis not present

## 2023-09-22 LAB — CBC
HCT: 30.4 % — ABNORMAL LOW (ref 36.0–46.0)
Hemoglobin: 9.9 g/dL — ABNORMAL LOW (ref 12.0–15.0)
MCH: 29.8 pg (ref 26.0–34.0)
MCHC: 32.6 g/dL (ref 30.0–36.0)
MCV: 91.6 fL (ref 80.0–100.0)
Platelets: 250 K/uL (ref 150–400)
RBC: 3.32 MIL/uL — ABNORMAL LOW (ref 3.87–5.11)
RDW: 15.4 % (ref 11.5–15.5)
WBC: 7.2 K/uL (ref 4.0–10.5)
nRBC: 0 % (ref 0.0–0.2)

## 2023-09-22 LAB — GLUCOSE, CAPILLARY
Glucose-Capillary: 232 mg/dL — ABNORMAL HIGH (ref 70–99)
Glucose-Capillary: 181 mg/dL — ABNORMAL HIGH (ref 70–99)
Glucose-Capillary: 269 mg/dL — ABNORMAL HIGH (ref 70–99)
Glucose-Capillary: 312 mg/dL — ABNORMAL HIGH (ref 70–99)
Glucose-Capillary: 299 mg/dL — ABNORMAL HIGH (ref 70–99)

## 2023-09-22 LAB — COMPREHENSIVE METABOLIC PANEL WITH GFR
ALT: 16 U/L (ref 0–44)
AST: 19 U/L (ref 15–41)
Albumin: 2.7 g/dL — ABNORMAL LOW (ref 3.5–5.0)
Alkaline Phosphatase: 65 U/L (ref 38–126)
Anion gap: 7 (ref 5–15)
BUN: 8 mg/dL (ref 8–23)
CO2: 24 mmol/L (ref 22–32)
Calcium: 8.8 mg/dL — ABNORMAL LOW (ref 8.9–10.3)
Chloride: 108 mmol/L (ref 98–111)
Creatinine, Ser: 0.68 mg/dL (ref 0.44–1.00)
GFR, Estimated: >60 mL/min (ref >60)
Glucose, Bld: 225 mg/dL — ABNORMAL HIGH (ref 70–99)
Potassium: 3.7 mmol/L (ref 3.5–5.1)
Sodium: 139 mmol/L (ref 135–145)
Total Bilirubin: 0.7 mg/dL (ref 0.0–1.2)
Total Protein: 6.3 g/dL — ABNORMAL LOW (ref 6.5–8.1)

## 2023-09-22 LAB — AMMONIA: Ammonia: 21 umol/L (ref 9–35)

## 2023-09-22 LAB — HEMOGLOBIN A1C
Hgb A1c MFr Bld: 8 % — ABNORMAL HIGH (ref 4.8–5.6)
Mean Plasma Glucose: 183 mg/dL

## 2023-09-22 LAB — LACTIC ACID, PLASMA: Lactic Acid, Venous: 1.1 mmol/L (ref 0.5–1.9)

## 2023-09-22 MED ORDER — HYDROXYZINE HCL 25 MG PO TABS
25.0000 mg | ORAL_TABLET | Freq: Two times a day (BID) | ORAL | Status: DC
  Administered 2023-09-22 – 2023-09-23 (×3): 25 mg via ORAL
  Filled 2023-09-22 (×3): qty 1

## 2023-09-22 MED ORDER — INSULIN GLARGINE-YFGN 100 UNIT/ML ~~LOC~~ SOLN
15.0000 [IU] | Freq: Every day | SUBCUTANEOUS | Status: DC
  Administered 2023-09-22 – 2023-09-23 (×2): 15 [IU] via SUBCUTANEOUS
  Filled 2023-09-22 (×2): qty 0.15

## 2023-09-22 MED ORDER — VITAMIN B-12 1000 MCG PO TABS
2000.0000 ug | ORAL_TABLET | Freq: Every day | ORAL | Status: DC
  Administered 2023-09-22 – 2023-09-26 (×5): 2000 ug via ORAL
  Filled 2023-09-22 (×5): qty 2

## 2023-09-22 MED ORDER — VITAMIN D3 25 MCG (1000 UNIT) PO TABS
5000.0000 [IU] | ORAL_TABLET | ORAL | Status: DC
  Administered 2023-09-22: 5000 [IU] via ORAL
  Filled 2023-09-22 (×2): qty 5

## 2023-09-22 MED ORDER — LEVOTHYROXINE SODIUM 75 MCG PO TABS
150.0000 ug | ORAL_TABLET | Freq: Every day | ORAL | Status: DC
  Administered 2023-09-23 – 2023-09-26 (×4): 150 ug via ORAL
  Filled 2023-09-22 (×4): qty 2

## 2023-09-22 MED ORDER — OMEGA-3-ACID ETHYL ESTERS 1 G PO CAPS
1.0000 g | ORAL_CAPSULE | Freq: Every day | ORAL | Status: DC
  Administered 2023-09-24 – 2023-09-26 (×3): 1 g via ORAL
  Filled 2023-09-22 (×5): qty 1

## 2023-09-22 NOTE — TOC Progression Note (Signed)
 Transition of Care [facility]) - Progression Note    Patient Details  Name: Jaclyn [name] MRN: [id number] Date of Birth: [birth year]  Transition of [facility]) CM/SW Contact  Lauraine FORBES Saa, LCSWA Phone Number: 09/22/2023, 4:39 PM  Clinical Narrative:     4:39 PM Per progressions, patient is expected to discharge to [facility] tomorrow. CSW relayed information to SNF and sent FL2. [facility] SNF confirmed they could admit patient tomorrow. CSW will continue to follow and be available to assist.  Expected Discharge Plan: Long Term Nursing Home Barriers to Discharge: Continued Medical Work up               Expected Discharge Plan and Services In-house Referral: Clinical Social Work   Post Acute Care Choice: Skilled Nursing Facility, Nursing Home Living arrangements for the past 2 months: Skilled Nursing Facility                                       Social Drivers of Health (SDOH) Interventions SDOH Screenings   Food Insecurity: No Food Insecurity (09/21/2023)  Housing: Low Risk  (09/21/2023)  Transportation Needs: No Transportation Needs (09/21/2023)  Utilities: Not At Risk (09/21/2023)  Depression (PHQ2-9): Medium Risk (09/10/2018)  Financial Resource Strain: High Risk (02/06/2021)  Social Connections: Patient Declined (09/21/2023)  Tobacco Use: Low Risk  (09/20/2023)    Readmission Risk Interventions     No data to display

## 2023-09-22 NOTE — Hospital Course (Addendum)
 88 y.o. 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  Patient has had similar issue in the past due to UTI, however she has been combative and hallucinating. In the ED hypertensive in 80s needing IV fluids and improved workup showed evidence of UTI UDS negative labs with hyperglycemia mild lactic acidosis COVID influenza RSV PCR negative blood culture sent patient was admitted on IV antibiotics.  Chest x-ray with no active disease. Patient urine culture grew strep and faecalis antibiotics subsequently adjusted to ampicillin  to cover both Overall mentation improving, plan is for discharge to skilled nursing facility She had uncontrolled hyperglycemia her home insulin  regimen were adjusted and, Premeal insulin  added. At this time she is medically stabilized blood sugar is well-controlled  Subjective: Seen and examined Alert awake oriented pleasant, agreeable for discharge to facility today Overnight no acute events, states she had episode of vomiting and discharge was held Blood pressure stable afebrile blood sugar in 160s  Discharge diagnosis :  UTI w/ sepsis history of recurrent UTIs Acute metabolic encephalopathy due to UTI History of dementia: CT head no acute finding and TSH normal. Initially hypotensive, resolved with IV fluid resuscitation. Mentation overall improved.  Urine cultures>100k strep, 80k E faecalis (R:vanc)> s/p Ampicillin  and fosfomycin antibiotic completed 8/8. Blood culture NGTD Has been more alert awake, oral intake picking up.her Cymbalta  was doubled 4 days ago but no signs of serotonin syndrome> duloxetine  on hold for now. Follow-up with PCP   Paroxysmal A-fib: Continue Eliquis  at the appropriate dose per pharmacy  recommended 5 mg twice daily.  Intermittent soft blood pressure Nausea vomiting x 1: Patient started to vomit after getting up to work with PT 8/8 discharge was held.  As per son intermittently she does get nausea on Mobility.Add Zofran  on discharge.    IDDM type 2: PTA on Januvia  and metformin  Lantus  18 units at bedtime>poorly controlled CBG here and adjusting basal bolus regimen.  Not well-controlled and overnight-160s > appears more tight will decrease insulin  regimen slightly > will cont on Semglee  along with Premeal insulin  that is to be taken only if she is eating more than 50% of the meal discussed and informed patient's son.   Her blood sugar needs to monitor 4 times a day at rehab. Recent Labs  Lab 09/21/23 0228 09/21/23 0609 09/25/23 0549 09/25/23 1121 09/25/23 1639 09/25/23 2106 09/26/23 0616  GLUCAP  --    < > 329* 213* 108* 115* 167*  HGBA1C 8.0*  --   --   --   --   --   --    < > = values in this interval not displayed.     Chronic normocytic anemia Hemoglobin at baseline.   Hypothyroidism: Euthyroid.  Resume Synthroid   Mobility: PT Orders: Active PT Follow up Rec: Skilled Nursing-Short Term Rehab (<3 Hours/Day)09/25/2023 1405   DVT prophylaxis: Eliquis  Code Status:   Code Status: Full Code Family Communication: plan of care discussed with patient/son at bedside. Patient status is: Remains hospitalized because of severity of illness Level of care: Progressive   Dispo: The patient is from: From nursing home            Anticipated disposition: Anticipating discharge to SNF today  Objective: Vitals last 24 hrs: Vitals:   09/26/23 0010 09/26/23 0358 09/26/23 0500 09/26/23 9286  BP: 106/62 (!) 115/56  (!) 109/57  Pulse: 81 96 91   Resp: 14 12 14 18   Temp: 98.4 F (36.9 C) 100.1 F (37.8 C)  98.4 F (36.9 C)  TempSrc: Oral Oral  Oral  SpO2: 97% 94% 92% 94%  Weight:      Height:        Physical Examination: General exam: AAOX2 HEENT:Oral mucosa moist,  Ear/Nose WNL grossly Respiratory system: B/L clear BS no use of accessory muscle Cardiovascular system: S1 & S2 +, No JVD. Gastrointestinal system: Abdomen soft,NT,ND, BS+ Nervous System: Alert, awake, moving extremities  Extremities: LE edema neg, distal extremities warm.  Skin: No rashes,no icterus. MSK: Normal muscle bulk,tone, power   Medications reviewed:  Scheduled Meds:  apixaban   5 mg Oral BID   cholecalciferol   5,000 Units Oral Weekly   cyanocobalamin   2,000 mcg Oral Daily   insulin  aspart  0-5 Units Subcutaneous QHS   insulin  aspart  0-9 Units Subcutaneous TID WC   insulin  aspart  12 Units Subcutaneous TID WC   insulin  glargine-yfgn  36 Units Subcutaneous Daily   levothyroxine   150 mcg Oral Daily   linagliptin   5 mg Oral Daily   metFORMIN   1,000 mg Oral BID WC   omega-3 acid ethyl esters  1 g Oral Daily   Continuous Infusions:   Diet: Diet Order             Diet Carb Modified Fluid consistency: Thin; Room service appropriate? Yes  Diet effective now

## 2023-09-22 NOTE — Plan of Care (Signed)

## 2023-09-22 NOTE — [place] FL2 (Signed)
 [place]  MEDICAID FL2 LEVEL OF CARE FORM     IDENTIFICATION  Patient Name: Jaclyn [name] Birthdate: [birth year] Sex: female Admission Date (Current Location): 09/20/2023  [facility] and [place] Number:  Producer, television/film/video and Address:  [facility], [address] [place], [place] [address]      Provider Number: [id number]  Attending Physician Name and Address:  Christobal Guadalajara, MD  Relative Name and Phone Number:  Baneza Bartoszek; [place]; [phone]    Current Level of Care: Hospital Recommended Level of Care: Skilled Nursing Facility Prior Approval Number:    Date Approved/Denied:   PASRR Number: [id number]  Discharge Plan: SNF    Current Diagnoses: Patient Active Problem List   Diagnosis Date Noted   Normocytic anemia 09/21/2023   Altered mental status 09/21/2023   Acute metabolic encephalopathy 11/22/2022   Hypomagnesemia 11/15/2021   Depression 11/15/2021   Incontinence 02/17/2021   Ataxia 02/17/2021   UTI (urinary tract infection) 02/16/2021   Failure to thrive in adult 02/16/2021   Mixed stress and urge urinary incontinence 02/05/2021   Yeast infection 12/03/2020   Hypokalemia    Labile blood glucose    Uncontrolled type 2 diabetes mellitus with hyperglycemia (HCC)    Orthostatic hypotension    Elevated blood pressure reading    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   History of recurrent UTI (urinary tract infection)    Dyslipidemia    Orthostasis    Dysarthria    Vagina, candidiasis 03/10/2019   Dementia (HCC) 03/10/2019   Acute respiratory failure with hypoxia (HCC) 01/24/2019   COVID-19 virus infection 01/15/2019   Acute lower UTI 01/15/2019   B12 deficiency 01/22/2018   History of CVA (cerebrovascular accident) 06/20/2017   Stenosis of right carotid artery    Peripheral edema 07/29/2016   Imbalance 04/07/2016   Intertriginous candidiasis 04/26/2015   Hard of hearing 04/12/2015   TIA (transient ischemic  attack) 04/10/2015   History of recurrent UTIs 04/10/2015   Dysuria 03/09/2015   Tremor 05/11/2014   Leg weakness, bilateral 05/11/2014   Fatty liver 08/04/2013   History of colonic polyps 10/05/2012   Esophageal reflux 10/05/2012   Carotid stenosis, symptomatic, with infarction (HCC) 10/14/2010   MDD (major depressive disorder), single episode, moderate (HCC) 01/13/2008   Essential hypertension, benign 01/13/2008   Paroxysmal atrial fibrillation (HCC) 01/13/2008   Intracranial vascular stenosis 01/13/2008   DIVERTICULOSIS OF COLON 01/13/2008   Fibromyalgia 01/13/2008   CHEST PAIN, ATYPICAL 01/13/2008   Hypothyroidism 04/13/2007   Insulin  dependent type 2 diabetes mellitus (HCC) 04/13/2007   Vitamin D  deficiency 04/13/2007   Hyperlipidemia LDL goal <70 04/13/2007   DEGENERATIVE JOINT DISEASE 04/13/2007    Orientation RESPIRATION BLADDER Height & Weight     Self  Normal (Room Air) Incontinent Weight: 144 lb 13.5 oz (65.7 kg) Height:  5' 3 (160 cm)  BEHAVIORAL SYMPTOMS/MOOD NEUROLOGICAL BOWEL NUTRITION STATUS    Convulsions/Seizures (History of seizures and seizure like activity) Continent Diet (Please see discharge summary)  AMBULATORY STATUS COMMUNICATION OF NEEDS Skin   Extensive Assist Verbally Normal                       Personal Care Assistance Level of Assistance  Bathing, Feeding, Dressing Bathing Assistance: Maximum assistance Feeding assistance: Maximum assistance Dressing Assistance: Maximum assistance     Functional Limitations Info  Hearing   Hearing Info: Impaired (R and L)      SPECIAL CARE FACTORS FREQUENCY  PT (By licensed PT), OT (By licensed OT)     PT Frequency: 5x OT Frequency: 5x            Contractures Contractures Info: Not present    Additional Factors Info  Code Status, Allergies, Insulin  Sliding Scale Code Status Info: Full Code Allergies Info: Naproxen Sodium; Statins; Sulfonamide Derivatives; Latex; Shellfish   Insulin   Sliding Scale Info: Please see discharge summary       Current Medications (09/22/2023):  This is the current hospital active medication list Current Facility-Administered Medications  Medication Dose Route Frequency Provider Last Rate Last Admin   acetaminophen  (TYLENOL ) tablet 650 mg  650 mg Oral Q6H PRN Alfornia Madison, MD       Or   acetaminophen  (TYLENOL ) suppository 650 mg  650 mg Rectal Q6H PRN Alfornia Madison, MD       apixaban  (ELIQUIS ) tablet 5 mg  5 mg Oral BID Walden, Jeffrey H, MD   5 mg at 09/22/23 1042   cefTRIAXone  (ROCEPHIN ) 1 g in sodium chloride  0.9 % 100 mL IVPB  1 g Intravenous Q24H Rathore, Vasundhra, MD 200 mL/hr at 09/22/23 1047 1 g at 09/22/23 1047   Chlorhexidine  Gluconate Cloth 2 % PADS 6 each  6 each Topical Daily Alfornia Madison, MD   6 each at 09/22/23 1200   cholecalciferol  (VITAMIN D3) tablet 5,000 Units  5,000 Units Oral Weekly Kc, Mennie, MD   5,000 Units at 09/22/23 1042   cyanocobalamin  (VITAMIN B12) tablet 2,000 mcg  2,000 mcg Oral Daily Kc, Ramesh, MD   2,000 mcg at 09/22/23 1042   hydrOXYzine  (ATARAX ) tablet 25 mg  25 mg Oral BID Kc, Mennie, MD   25 mg at 09/22/23 1042   insulin  aspart (novoLOG ) injection 0-5 Units  0-5 Units Subcutaneous QHS Rathore, Vasundhra, MD   2 Units at 09/21/23 2124   insulin  aspart (novoLOG ) injection 0-9 Units  0-9 Units Subcutaneous TID WC Alfornia Madison, MD   1 Units at 09/22/23 1140   insulin  glargine-yfgn (SEMGLEE ) injection 15 Units  15 Units Subcutaneous Daily Kc, Ramesh, MD   15 Units at 09/22/23 1103   [START ON 09/23/2023] levothyroxine  (SYNTHROID ) tablet 150 mcg  150 mcg Oral Daily Kc, Ramesh, MD       mupirocin  ointment (BACTROBAN ) 2 % 1 Application  1 Application Nasal BID Rathore, Vasundhra, MD   1 Application at 09/22/23 1044   omega-3 acid ethyl esters (LOVAZA ) capsule 1 g  1 g Oral Daily Christobal Mennie, MD         Discharge Medications: Please see discharge summary for a list of discharge  medications.  Relevant Imaging Results:  Relevant Lab Results:   Additional Information SSN: [id number].  Kollyn Lingafelter E Pranav Lince, LCSWA

## 2023-09-22 NOTE — Progress Notes (Signed)
 PROGRESS NOTE Jaclyn [name]  FMW:[id number] DOB: March 20, 1935 DOA: 09/20/2023 PCP: System, Provider Not In  Brief Narrative/Hospital Course:  88 y.o. 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  Patient has had similar issue in the past due to UTI, however she has been combative and hallucinating. In the ED hypertensive in 80s needing IV fluids and improved workup showed evidence of UTI UDS negative labs with hyperglycemia mild lactic acidosis COVID influenza RSV PCR negative blood culture sent patient was admitted on IV antibiotics.  Chest x-ray with no active disease.  Subjective: Seen and examined today Son at the bedside patient has been very sleepy more than usual, very hard of hearing She is slowly waking up No BM since Friday Overnight afebrile BP 110-150s, on room air Labs reviewed stable CMP Alamillo 2.7 blood sugar 230s, A1c 8.0.CBC with hemoglobin 9.1 previously 10.1-more or less the same  Assessment and plan:  UTI w/ sepsis history of recurrent UTIs Acute metabolic encephalopathy due to UTI History of dementia: Initially hypotensive, resolved with IV fluid resuscitation.  Urine culture with streptococci alphahemolytic, blood culture NGTD. Continue ceftriaxone , supportive care, delirium precaution PT OT. Still confused-but mostly mostly PE, she is very hard of hearing Avoid sedatives.   Reportedly dose of home duloxetine  was doubled 4 days ago but no signs of serotonin syndrome> duloxetine  on hold for now.   CT head no acute finding and TSH normal   Paroxysmal A-fib Continue Eliquis     IDDM type 2: PTA on Januvia  and metformin  Lantus  8 units at bedtime. With uncontrolled hyperglycemia A1c 8.0. CONT SSIm Semglee  at 15 units at  bedtime. Hold po meds Recent Labs  Lab 09/21/23 0228 09/21/23 0609 09/21/23 1103 09/21/23 1557 09/21/23 2102 09/22/23 0619 09/22/23 1046  GLUCAP  --    < > 169* 245* 223* 232* 181*  HGBA1C 8.0*  --   --   --   --   --   --    < > = values in this interval not displayed.     Chronic normocytic anemia Hemoglobin at baseline.   Hypothyroidism: Euthyroid.  Resume Synthroid   Mobility: PT Orders: Active PT Follow up Rec:    DVT prophylaxis: Eliquis  Code Status:   Code Status: Full Code Family Communication: plan of care discussed with patient/son at bedside. Patient status is: Remains hospitalized because of severity of illness Level of care: Progressive   Dispo: The patient is from: From nursing home            Anticipated disposition: TBD-anticipate discharge to facility in 1 to 2 days Objective: Vitals last 24 hrs: Vitals:   09/21/23 2249 09/22/23 0411 09/22/23 0740 09/22/23 0900  BP: (!) 137/56 (!) 110/46 (!) 158/72 131/62  Pulse: 77 73 73 70  Resp: 17 12 (!) 21 11  Temp: 99.3 F (37.4 C) 99.3 F (37.4 C) 99 F (37.2 C) 98.1 F (36.7 C)  TempSrc: Axillary Axillary Oral Axillary  SpO2: 93% 93% 94% 96%  Weight:      Height:        Physical Examination: General exam: Lethargic, able to wake up and tell her name very hard of hearing HEENT:Oral mucosa moist, Ear/Nose WNL grossly Respiratory system: B/L Clearno use of accessory muscle Cardiovascular system: S1 & S2 +, No JVD. Gastrointestinal system:  Abdomen soft,NT,ND, BS+ Nervous System: Alert, awake, moving all extremities,and following commands. Extremities: LE edema neg, distal extremities warm.  Skin: No rashes,no icterus. MSK: Normal muscle bulk,tone, power   Medications reviewed:  Scheduled Meds:  apixaban   5 mg Oral BID   Chlorhexidine  Gluconate Cloth  6 each Topical Daily   cholecalciferol   5,000 Units Oral Weekly   cyanocobalamin   2,000 mcg Oral Daily   hydrOXYzine   25 mg Oral BID   insulin   aspart  0-5 Units Subcutaneous QHS   insulin  aspart  0-9 Units Subcutaneous TID WC   insulin  glargine-yfgn  15 Units Subcutaneous Daily   [START ON 09/23/2023] levothyroxine   150 mcg Oral Daily   mupirocin  ointment  1 Application Nasal BID   omega-3 acid ethyl esters  1 g Oral Daily   Continuous Infusions:  cefTRIAXone  (ROCEPHIN )  IV 1 g (09/22/23 1047)   Diet: Diet Order             Diet Carb Modified Fluid consistency: Thin; Room service appropriate? Yes  Diet effective now                    Data Reviewed: I have personally reviewed following labs and imaging studies ( see epic result tab) CBC: Recent Labs  Lab 09/20/23 2001 09/21/23 0228 09/22/23 0320  WBC 8.0 8.0 7.2  NEUTROABS 5.1  --   --   HGB 10.5* 10.1* 9.9*  HCT 32.6* 31.6* 30.4*  MCV 94.2 93.2 91.6  PLT 267 249 250   CMP: Recent Labs  Lab 09/20/23 2001 09/21/23 0228 09/22/23 0320  NA 138 138 139  K 4.9 3.8 3.7  CL 103 105 108  CO2 24 22 24   GLUCOSE 272* 285* 225*  BUN 13 12 8   CREATININE 0.79 0.76 0.68  CALCIUM  8.9 8.8* 8.8*   GFR: Estimated Creatinine Clearance: 46 mL/min (by C-G formula based on SCr of 0.68 mg/dL). Recent Labs  Lab 09/20/23 2001 09/21/23 0228 09/22/23 0320  AST 34 23 19  ALT 16 18 16   ALKPHOS 71 71 65  BILITOT 1.5* 0.8 0.7  PROT 6.2* 6.2* 6.3*  ALBUMIN 2.8* 2.8* 2.7*   No results for input(s): LIPASE, AMYLASE in the last 168 hours.  Recent Labs  Lab 09/22/23 0320  AMMONIA 21   Coagulation Profile:  Recent Labs  Lab 09/20/23 2019  INR 1.3*   Unresulted Labs (From admission, onward)     Start     Ordered   09/23/23 0500  Basic metabolic panel with GFR  Daily,   R     Question:  Specimen collection method  Answer:  Lab=Lab collect   09/22/23 0915   09/23/23 0500  CBC  Daily,   R     Question:  Specimen collection method  Answer:  Lab=Lab collect   09/22/23 0915           Antimicrobials/Microbiology: Anti-infectives (From admission, onward)     Start     Dose/Rate Route Frequency Ordered Stop   09/21/23 1000  cefTRIAXone  (ROCEPHIN ) 1 g in sodium chloride  0.9 % 100 mL IVPB        1 g 200 mL/hr over 30 Minutes Intravenous Every 24 hours 09/21/23 0054     09/20/23 2030  ceFEPIme  (MAXIPIME ) 2 g in sodium chloride  0.9 % 100 mL IVPB        2 g 200 mL/hr over 30 Minutes Intravenous  Once 09/20/23 2019 09/20/23 2156   09/20/23 2030  metroNIDAZOLE  (FLAGYL )  IVPB 500 mg        500 mg 100 mL/hr over 60 Minutes Intravenous  Once 09/20/23 2019 09/21/23 0055   09/20/23 2030  vancomycin  (VANCOCIN ) IVPB 1000 mg/200 mL premix        1,000 mg 200 mL/hr over 60 Minutes Intravenous  Once 09/20/23 2019 09/20/23 2309         Component Value Date/Time   SDES BLOOD RIGHT FOREARM 09/20/2023 2110   SPECREQUEST  09/20/2023 2110    BOTTLES DRAWN AEROBIC AND ANAEROBIC Blood Culture adequate volume   CULT  09/20/2023 2110    NO GROWTH 2 DAYS Performed at [facility], [address], [place], [place] [address]    REPTSTATUS PENDING 09/20/2023 2110     Mennie LAMY, MD [facility] 09/22/2023, 11:06 AM

## 2023-09-22 NOTE — Evaluation (Signed)
 Physical Therapy Evaluation Patient Details Name: Jaclyn [name] MRN: [id number] DOB: Dec 01, 1935 Today's Date: 09/22/2023  History of Present Illness  88 y.o. female presenting 8/3 to the ED for altered mental status.  Found to have UTI w/ sepsis  history of recurrent UTIs  Acute metabolic encephalopathy due to UTI. PMH significant of dementia, paroxysmal A-fib on Eliquis , fibromyalgia, hypertension, poorly controlled type 2 diabetes, hypothyroidism, TIA  Clinical Impression  Pt admitted with above diagnosis. Hx provided primarily by son due to patient's AMS. He thinks she was getting physical therapy at SNF. He hopes that she develops to the point that he can transfer her himself so he can take her on outings. Pt states she would get out of bed almost daily to w/c with staff assist. Pt required mod assist for bed mobility, and max assist to stand. Could not safely transfer OOB today. Had episode of bowel incontinence. Will progress as tolerated. Patient will benefit from continued inpatient follow up therapy, <3 hours/day. Pt currently with functional limitations due to the deficits listed below (see PT Problem List). Pt will benefit from acute skilled PT to increase their independence and safety with mobility to allow discharge.           If plan is discharge home, recommend the following: Two people to help with walking and/or transfers;Two people to help with bathing/dressing/bathroom;Assist for transportation;Supervision due to cognitive status;Direct supervision/assist for medications management;Direct supervision/assist for financial management;Assistance with cooking/housework   Can travel by private vehicle   No    Equipment Recommendations None recommended by PT  Recommendations for Other Services       Functional Status Assessment Patient has had a recent decline in their functional status and demonstrates the ability to make significant improvements in function in a reasonable and  predictable amount of time.     Precautions / Restrictions Precautions Precautions: Fall Recall of Precautions/Restrictions: Impaired Restrictions Weight Bearing Restrictions Per Provider Order: No      Mobility  Bed Mobility Overal bed mobility: Needs Assistance Bed Mobility: Rolling, Sidelying to Sit, Sit to Sidelying Rolling: Min assist Sidelying to sit: Mod assist     Sit to sidelying: Min assist General bed mobility comments: Min assist to roll Lt and Rt in bed, cues to use rail and pull. Mod assist for trunk support to rise, Min assist for LEs back into bed into sidelying position.    Transfers Overall transfer level: Needs assistance Equipment used: Rolling walker (2 wheels), 1 person hand held assist Transfers: Sit to/from Stand Sit to Stand: Max assist           General transfer comment: max assist for boost and balance with significant retropulsion. Performed several times with RW and hand held support. Can only tolerate for short period of time. Max multimodal cues to work on forward weight shifting techniques. Attempted pivot but unable to safely perform. Developed bowel incontinence.    Ambulation/Gait                  Stairs            Wheelchair Mobility     Tilt Bed    Modified Rankin (Stroke Patients Only)       Balance Overall balance assessment: Needs assistance Sitting-balance support: No upper extremity supported, Feet supported Sitting balance-Leahy Scale: Fair Sitting balance - Comments: CGA EOB   Standing balance support: Bilateral upper extremity supported, Single extremity supported, Reliant on assistive device for balance Standing balance-Leahy Scale: Zero  Pertinent Vitals/Pain Pain Assessment Pain Assessment: No/denies pain    Home Living Family/patient expects to be discharged to:: Skilled nursing facility                   Additional Comments: [facility]  resident. Son states she was getting PT but unsure if this is still happening. Would like for her to be able to transfer to w/c with his help so he can take her out on the weekends.    Prior Function Prior Level of Function : Needs assist             Mobility Comments: Pt states staff assists to w/c. ADLs Comments: pt is able to feed herself, otherwise requires max assist for other ADLs     Extremity/Trunk Assessment   Upper Extremity Assessment Upper Extremity Assessment: Defer to OT evaluation    Lower Extremity Assessment Lower Extremity Assessment: Generalized weakness;Difficult to assess due to impaired cognition       Communication   Communication Communication: Impaired Factors Affecting Communication: Hearing impaired    Cognition Arousal: Alert Behavior During Therapy: WFL for tasks assessed/performed   PT - Cognitive impairments: Memory, Awareness, Attention, Initiation, Sequencing, Problem solving, Safety/Judgement                         Following commands: Impaired Following commands impaired: Follows one step commands with increased time, Follows one step commands inconsistently     Cueing Cueing Techniques: Verbal cues, Gestural cues, Tactile cues, Visual cues     General Comments General comments (skin integrity, edema, etc.): Bowel incontinence upon standing - assisted with peri-care/hygiene and linens changed.    Exercises     Assessment/Plan    PT Assessment Patient needs continued PT services  PT Problem List Decreased strength;Decreased activity tolerance;Decreased balance;Decreased mobility;Decreased coordination;Decreased cognition;Decreased knowledge of use of DME;Decreased safety awareness;Decreased knowledge of precautions       PT Treatment Interventions DME instruction;Gait training;Functional mobility training;Therapeutic activities;Therapeutic exercise;Balance training;Neuromuscular re-education;Cognitive  remediation;Patient/family education;Wheelchair mobility training    PT Goals (Current goals can be found in the Care Plan section)  Acute Rehab PT Goals Patient Stated Goal: Son wishes for pt to be able to transfer with him to/from w/c in order to take her out of SNF on weekends. PT Goal Formulation: With patient/family Time For Goal Achievement: 10/06/23 Potential to Achieve Goals: Fair    Frequency Min 1X/week     Co-evaluation               AM-PAC PT 6 Clicks Mobility  Outcome Measure Help needed turning from your back to your side while in a flat bed without using bedrails?: A Little Help needed moving from lying on your back to sitting on the side of a flat bed without using bedrails?: A Lot Help needed moving to and from a bed to a chair (including a wheelchair)?: Total Help needed standing up from a chair using your arms (e.g., wheelchair or bedside chair)?: Total Help needed to walk in hospital room?: Total Help needed climbing 3-5 steps with a railing? : Total 6 Click Score: 9    End of Session Equipment Utilized During Treatment: Gait belt Activity Tolerance: Other (comment) (Weakness/ confusion) Patient left: in bed;with call bell/phone within reach;with bed alarm set Nurse Communication: Mobility status;Other (comment) (BM) PT Visit Diagnosis: Unsteadiness on feet (R26.81);Other abnormalities of gait and mobility (R26.89);Muscle weakness (generalized) (M62.81);Difficulty in walking, not elsewhere classified (R26.2);Other symptoms and signs  involving the nervous system (R29.898)    Time: 8572-8544 PT Time Calculation (min) (ACUTE ONLY): 28 min   Charges:   PT Evaluation $PT Eval Moderate Complexity: 1 Mod PT Treatments $Therapeutic Activity: 8-22 mins PT General Charges $$ ACUTE PT VISIT: 1 Visit         Leontine Roads, PT, DPT [facility]  Rehabilitation Services Physical Therapist Office: [phone] Website: [email or web address]   Leontine GORMAN Roads 09/22/2023, 4:19 PM

## 2023-09-23 DIAGNOSIS — N390 Urinary tract infection, site not specified: Secondary | ICD-10-CM | POA: Diagnosis not present

## 2023-09-23 DIAGNOSIS — R319 Hematuria, unspecified: Secondary | ICD-10-CM | POA: Diagnosis not present

## 2023-09-23 LAB — GLUCOSE, CAPILLARY
Glucose-Capillary: 410 mg/dL — ABNORMAL HIGH (ref 70–99)
Glucose-Capillary: 251 mg/dL — ABNORMAL HIGH (ref 70–99)
Glucose-Capillary: 339 mg/dL — ABNORMAL HIGH (ref 70–99)
Glucose-Capillary: 281 mg/dL — ABNORMAL HIGH (ref 70–99)

## 2023-09-23 LAB — CBC
HCT: 28.9 % — ABNORMAL LOW (ref 36.0–46.0)
Hemoglobin: 9.6 g/dL — ABNORMAL LOW (ref 12.0–15.0)
MCH: 30.1 pg (ref 26.0–34.0)
MCHC: 33.2 g/dL (ref 30.0–36.0)
MCV: 90.6 fL (ref 80.0–100.0)
Platelets: 252 K/uL (ref 150–400)
RBC: 3.19 MIL/uL — ABNORMAL LOW (ref 3.87–5.11)
RDW: 15.4 % (ref 11.5–15.5)
WBC: 6.3 K/uL (ref 4.0–10.5)
nRBC: 0 % (ref 0.0–0.2)

## 2023-09-23 LAB — BASIC METABOLIC PANEL WITH GFR
Anion gap: 9 (ref 5–15)
BUN: 8 mg/dL (ref 8–23)
CO2: 23 mmol/L (ref 22–32)
Calcium: 8.9 mg/dL (ref 8.9–10.3)
Chloride: 103 mmol/L (ref 98–111)
Creatinine, Ser: 0.77 mg/dL (ref 0.44–1.00)
GFR, Estimated: >60 mL/min (ref >60)
Glucose, Bld: 383 mg/dL — ABNORMAL HIGH (ref 70–99)
Potassium: 3.9 mmol/L (ref 3.5–5.1)
Sodium: 135 mmol/L (ref 135–145)

## 2023-09-23 LAB — URINE CULTURE: Culture: >=100000 — AB

## 2023-09-23 MED ORDER — INSULIN GLARGINE-YFGN 100 UNIT/ML ~~LOC~~ SOLN
22.0000 [IU] | Freq: Every day | SUBCUTANEOUS | Status: DC
  Filled 2023-09-23: qty 0.22

## 2023-09-23 MED ORDER — INSULIN ASPART 100 UNIT/ML IJ SOLN
4.0000 [IU] | Freq: Three times a day (TID) | INTRAMUSCULAR | Status: DC
  Administered 2023-09-23 – 2023-09-24 (×3): 4 [IU] via SUBCUTANEOUS

## 2023-09-23 MED ORDER — INSULIN GLARGINE-YFGN 100 UNIT/ML ~~LOC~~ SOLN
7.0000 [IU] | Freq: Once | SUBCUTANEOUS | Status: AC
  Administered 2023-09-23: 7 [IU] via SUBCUTANEOUS
  Filled 2023-09-23: qty 0.07

## 2023-09-23 MED ORDER — SODIUM CHLORIDE 0.9 % IV SOLN
1.0000 g | Freq: Three times a day (TID) | INTRAVENOUS | Status: DC
  Administered 2023-09-23 – 2023-09-25 (×6): 1 g via INTRAVENOUS
  Filled 2023-09-23 (×7): qty 1000

## 2023-09-23 MED ORDER — INSULIN ASPART 100 UNIT/ML IJ SOLN
4.0000 [IU] | Freq: Once | INTRAMUSCULAR | Status: AC
  Administered 2023-09-23: 4 [IU] via SUBCUTANEOUS

## 2023-09-23 MED ORDER — INSULIN GLARGINE-YFGN 100 UNIT/ML ~~LOC~~ SOLN
25.0000 [IU] | Freq: Every day | SUBCUTANEOUS | Status: DC
  Filled 2023-09-23: qty 0.25

## 2023-09-23 NOTE — Plan of Care (Signed)
   Problem: Education: Goal: Ability to describe self-care measures that may prevent or decrease complications (Diabetes Survival Skills Education) will improve Outcome: Progressing Goal: Individualized Educational Video(s) Outcome: Progressing   Problem: Coping: Goal: Ability to adjust to condition or change in health will improve Outcome: Progressing   Problem: Fluid Volume: Goal: Ability to maintain a balanced intake and output will improve Outcome: Progressing   Problem: Metabolic: Goal: Ability to maintain appropriate glucose levels will improve Outcome: Progressing   Problem: Nutritional: Goal: Maintenance of adequate nutrition will improve Outcome: Progressing Goal: Progress toward achieving an optimal weight will improve Outcome: Progressing   Problem: Skin Integrity: Goal: Risk for impaired skin integrity will decrease Outcome: Progressing   Problem: Tissue Perfusion: Goal: Adequacy of tissue perfusion will improve Outcome: Progressing   Problem: Education: Goal: Knowledge of General Education information will improve Description: Including pain rating scale, medication(s)/side effects and non-pharmacologic comfort measures Outcome: Progressing   Problem: Health Behavior/Discharge Planning: Goal: Ability to manage health-related needs will improve Outcome: Progressing   Problem: Clinical Measurements: Goal: Ability to maintain clinical measurements within normal limits will improve Outcome: Progressing Goal: Will remain free from infection Outcome: Progressing Goal: Diagnostic test results will improve Outcome: Progressing Goal: Respiratory complications will improve Outcome: Progressing Goal: Cardiovascular complication will be avoided Outcome: Progressing   Problem: Activity: Goal: Risk for activity intolerance will decrease Outcome: Progressing   Problem: Nutrition: Goal: Adequate nutrition will be maintained Outcome: Progressing   Problem:  Coping: Goal: Level of anxiety will decrease Outcome: Progressing   Problem: Elimination: Goal: Will not experience complications related to bowel motility Outcome: Progressing Goal: Will not experience complications related to urinary retention Outcome: Progressing   Problem: Pain Managment: Goal: General experience of comfort will improve and/or be controlled Outcome: Progressing   Problem: Safety: Goal: Ability to remain free from injury will improve Outcome: Progressing   Problem: Skin Integrity: Goal: Risk for impaired skin integrity will decrease Outcome: Progressing

## 2023-09-23 NOTE — Progress Notes (Signed)
 PROGRESS NOTE Jaclyn [name]  FMW:[id number] DOB: Apr 25, 1935 DOA: 09/20/2023 PCP: System, Provider Not In  Brief Narrative/Hospital Course:  88 y.o. 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  Patient has had similar issue in the past due to UTI, however she has been combative and hallucinating. In the ED hypertensive in 80s needing IV fluids and improved workup showed evidence of UTI UDS negative labs with hyperglycemia mild lactic acidosis COVID influenza RSV PCR negative blood culture sent patient was admitted on IV antibiotics.  Chest x-ray with no active disease.  Subjective: Seen and examined today Patient was sleeping son at the bedside blood sugar has been poorly controlled 400 this morning She has been eating better less lethargic At baseline HoH  Assessment and plan:  UTI w/ sepsis history of recurrent UTIs Acute metabolic encephalopathy due to UTI History of dementia: Initially hypotensive, resolved with IV fluid resuscitation.Urine culture with streptococci alphahemolytic, blood culture NGTD. CT head no acute finding and TSH normal.  Mentation overall improving Continue ceftriaxone , supportive care, delirium precaution PT OT. Now less lethargic.  Continue to avoid sedatives opiates Reportedly dose of home duloxetine  was doubled 4 days ago but no signs of serotonin syndrome> duloxetine  on hold for now.     Paroxysmal A-fib Continue Eliquis     IDDM type 2: PTA on Januvia  and metformin  Lantus  18 units at bedtime. Remains poorly controlled add Premeal insulin  and increase Semglee  to 22 u, cont ssi. Recent Labs  Lab 09/21/23 0228 09/21/23 0609 09/22/23 1046 09/22/23 1648 09/22/23 2056 09/22/23 2130 09/23/23 0604  GLUCAP   --    < > 181* 269* 312* 299* 410*  HGBA1C 8.0*  --   --   --   --   --   --    < > = values in this interval not displayed.     Chronic normocytic anemia Hemoglobin at baseline.   Hypothyroidism: Euthyroid.  Resume Synthroid   Mobility: PT Orders: Active PT Follow up Rec: Skilled Nursing-Short Term Rehab (<3 Hours/Day)09/22/2023 1600   DVT prophylaxis: Eliquis  Code Status:   Code Status: Full Code Family Communication: plan of care discussed with patient/son at bedside. Patient status is: Remains hospitalized because of severity of illness Level of care: Progressive   Dispo: The patient is from: From nursing home            Anticipated disposition: Anticipating discharge to SNF tomorrow   Objective: Vitals last 24 hrs: Vitals:   09/22/23 1948 09/22/23 2329 09/23/23 0448 09/23/23 0805  BP: (!) 164/68 116/63 (!) 141/54 (!) 124/59  Pulse: 77 78 73 73  Resp: 18 20 18 12   Temp: 99.6 F (37.6 C) 98.5 F (36.9 C) 97.8 F (36.6 C) 98.2 F (36.8 C)  TempSrc: Oral Oral Oral Axillary  SpO2: 97% 94% 95% 97%  Weight:      Height:        Physical Examination: General exam: Alert awake after waking up.   HEENT:Oral mucosa moist, Ear/Nose WNL grossly Respiratory system: B/L Clearno use of accessory muscle Cardiovascular system: S1 & S2 +, No JVD. Gastrointestinal system: Abdomen soft,NT,ND, BS+ Nervous System: Alert, awake, moving all extremities,and following commands. Extremities: LE edema neg, distal extremities warm.  Skin: No rashes,no icterus. MSK: Normal muscle bulk,tone, power   Medications reviewed:  Scheduled  Meds:  apixaban   5 mg Oral BID   Chlorhexidine  Gluconate Cloth  6 each Topical Daily   cholecalciferol   5,000 Units Oral Weekly   cyanocobalamin   2,000 mcg Oral Daily   hydrOXYzine   25 mg Oral BID   insulin  aspart  0-5 Units Subcutaneous QHS   insulin  aspart  0-9 Units Subcutaneous TID WC   insulin  aspart  4 Units Subcutaneous TID WC   [START ON 09/24/2023]  insulin  glargine-yfgn  22 Units Subcutaneous Daily   levothyroxine   150 mcg Oral Daily   mupirocin  ointment  1 Application Nasal BID   omega-3 acid ethyl esters  1 g Oral Daily   Continuous Infusions:  cefTRIAXone  (ROCEPHIN )  IV 1 g (09/23/23 0948)   Diet: Diet Order             Diet Carb Modified Fluid consistency: Thin; Room service appropriate? Yes  Diet effective now                    Data Reviewed: I have personally reviewed following labs and imaging studies ( see epic result tab) CBC: Recent Labs  Lab 09/20/23 2001 09/21/23 0228 09/22/23 0320 09/23/23 0233  WBC 8.0 8.0 7.2 6.3  NEUTROABS 5.1  --   --   --   HGB 10.5* 10.1* 9.9* 9.6*  HCT 32.6* 31.6* 30.4* 28.9*  MCV 94.2 93.2 91.6 90.6  PLT 267 249 250 252   CMP: Recent Labs  Lab 09/20/23 2001 09/21/23 0228 09/22/23 0320 09/23/23 0233  NA 138 138 139 135  K 4.9 3.8 3.7 3.9  CL 103 105 108 103  CO2 24 22 24 23   GLUCOSE 272* 285* 225* 383*  BUN 13 12 8 8   CREATININE 0.79 0.76 0.68 0.77  CALCIUM  8.9 8.8* 8.8* 8.9   GFR: Estimated Creatinine Clearance: 46 mL/min (by C-G formula based on SCr of 0.77 mg/dL). Recent Labs  Lab 09/20/23 2001 09/21/23 0228 09/22/23 0320  AST 34 23 19  ALT 16 18 16   ALKPHOS 71 71 65  BILITOT 1.5* 0.8 0.7  PROT 6.2* 6.2* 6.3*  ALBUMIN 2.8* 2.8* 2.7*   No results for input(s): LIPASE, AMYLASE in the last 168 hours.  Recent Labs  Lab 09/22/23 0320  AMMONIA 21   Coagulation Profile:  Recent Labs  Lab 09/20/23 2019  INR 1.3*   Unresulted Labs (From admission, onward)     Start     Ordered   09/23/23 0500  Basic metabolic panel with GFR  Daily,   R     Question:  Specimen collection method  Answer:  Lab=Lab collect   09/22/23 0915   09/23/23 0500  CBC  Daily,   R     Question:  Specimen collection method  Answer:  Lab=Lab collect   09/22/23 0915           Antimicrobials/Microbiology: Anti-infectives (From admission, onward)    Start     Dose/Rate  Route Frequency Ordered Stop   09/21/23 1000  cefTRIAXone  (ROCEPHIN ) 1 g in sodium chloride  0.9 % 100 mL IVPB        1 g 200 mL/hr over 30 Minutes Intravenous Every 24 hours 09/21/23 0054     09/20/23 2030  ceFEPIme  (MAXIPIME ) 2 g in sodium chloride  0.9 % 100 mL IVPB        2 g 200 mL/hr over 30 Minutes Intravenous  Once 09/20/23 2019 09/20/23 2156   09/20/23 2030  metroNIDAZOLE  (FLAGYL ) IVPB 500 mg  500 mg 100 mL/hr over 60 Minutes Intravenous  Once 09/20/23 2019 09/21/23 0055   09/20/23 2030  vancomycin  (VANCOCIN ) IVPB 1000 mg/200 mL premix        1,000 mg 200 mL/hr over 60 Minutes Intravenous  Once 09/20/23 2019 09/20/23 2309         Component Value Date/Time   SDES BLOOD RIGHT FOREARM 09/20/2023 2110   SPECREQUEST  09/20/2023 2110    BOTTLES DRAWN AEROBIC AND ANAEROBIC Blood Culture adequate volume   CULT  09/20/2023 2110    NO GROWTH 3 DAYS Performed at [facility], [address], [place], [place] [address]    REPTSTATUS PENDING 09/20/2023 2110     Mennie LAMY, MD [facility] 09/23/2023, 10:56 AM

## 2023-09-23 NOTE — TOC Progression Note (Signed)
 Transition of Care [facility]) - Progression Note    Patient Details  Name: Jaclyn [name] MRN: [id number] Date of Birth: 06-14-1935  [facility] CM/SW Contact  Lauraine FORBES Saa, LCSWA Phone Number: 09/23/2023, 11:38 AM  Clinical Narrative:     11:38 AM Per hospitalist, patient is expected to discharge tomorrow or Friday. CSW informed bedside RN and [facility] LTC. CSW will continue to follow and be available to assist.  Expected Discharge Plan: Long Term Nursing Home Barriers to Discharge: Continued Medical Work up               Expected Discharge Plan and Services In-house Referral: Clinical Social Work   Post Acute Care Choice: Skilled Nursing Facility, Nursing Home Living arrangements for the past 2 months: Skilled Nursing Facility                                       Social Drivers of Health (SDOH) Interventions SDOH Screenings   Food Insecurity: No Food Insecurity (09/21/2023)  Housing: Low Risk  (09/21/2023)  Transportation Needs: No Transportation Needs (09/21/2023)  Utilities: Not At Risk (09/21/2023)  Depression (PHQ2-9): Medium Risk (09/10/2018)  Financial Resource Strain: High Risk (02/06/2021)  Social Connections: Patient Declined (09/21/2023)  Tobacco Use: Low Risk  (09/20/2023)    Readmission Risk Interventions     No data to display

## 2023-09-23 NOTE — Progress Notes (Signed)
 AM CBG 410.  Dr. Franky paged as ordered.

## 2023-09-24 DIAGNOSIS — N390 Urinary tract infection, site not specified: Secondary | ICD-10-CM | POA: Diagnosis not present

## 2023-09-24 DIAGNOSIS — R319 Hematuria, unspecified: Secondary | ICD-10-CM | POA: Diagnosis not present

## 2023-09-24 LAB — CBC
HCT: 29.7 % — ABNORMAL LOW (ref 36.0–46.0)
Hemoglobin: 9.9 g/dL — ABNORMAL LOW (ref 12.0–15.0)
MCH: 30.5 pg (ref 26.0–34.0)
MCHC: 33.3 g/dL (ref 30.0–36.0)
MCV: 91.4 fL (ref 80.0–100.0)
Platelets: 237 K/uL (ref 150–400)
RBC: 3.25 MIL/uL — ABNORMAL LOW (ref 3.87–5.11)
RDW: 15.1 % (ref 11.5–15.5)
WBC: 7.1 K/uL (ref 4.0–10.5)
nRBC: 0 % (ref 0.0–0.2)

## 2023-09-24 LAB — BASIC METABOLIC PANEL WITH GFR
Anion gap: 10 (ref 5–15)
BUN: 7 mg/dL — ABNORMAL LOW (ref 8–23)
CO2: 24 mmol/L (ref 22–32)
Calcium: 8.7 mg/dL — ABNORMAL LOW (ref 8.9–10.3)
Chloride: 104 mmol/L (ref 98–111)
Creatinine, Ser: 0.79 mg/dL (ref 0.44–1.00)
GFR, Estimated: >60 mL/min (ref >60)
Glucose, Bld: 314 mg/dL — ABNORMAL HIGH (ref 70–99)
Potassium: 3.6 mmol/L (ref 3.5–5.1)
Sodium: 138 mmol/L (ref 135–145)

## 2023-09-24 LAB — GLUCOSE, CAPILLARY
Glucose-Capillary: 311 mg/dL — ABNORMAL HIGH (ref 70–99)
Glucose-Capillary: 261 mg/dL — ABNORMAL HIGH (ref 70–99)
Glucose-Capillary: 175 mg/dL — ABNORMAL HIGH (ref 70–99)
Glucose-Capillary: 170 mg/dL — ABNORMAL HIGH (ref 70–99)

## 2023-09-24 MED ORDER — INSULIN GLARGINE-YFGN 100 UNIT/ML ~~LOC~~ SOLN
30.0000 [IU] | Freq: Every day | SUBCUTANEOUS | Status: DC
  Administered 2023-09-24: 30 [IU] via SUBCUTANEOUS
  Filled 2023-09-24 (×2): qty 0.3

## 2023-09-24 MED ORDER — INSULIN ASPART 100 UNIT/ML IJ SOLN
8.0000 [IU] | Freq: Three times a day (TID) | INTRAMUSCULAR | Status: DC
  Administered 2023-09-24 – 2023-09-25 (×3): 8 [IU] via SUBCUTANEOUS

## 2023-09-24 NOTE — Plan of Care (Signed)
  Problem: Education: Goal: Ability to describe self-care measures that may prevent or decrease complications (Diabetes Survival Skills Education) will improve Outcome: Progressing Goal: Individualized Educational Video(s) Outcome: Progressing   Problem: Coping: Goal: Ability to adjust to condition or change in health will improve Outcome: Progressing   Problem: Fluid Volume: Goal: Ability to maintain a balanced intake and output will improve Outcome: Progressing   Problem: Health Behavior/Discharge Planning: Goal: Ability to identify and utilize available resources and services will improve Outcome: Progressing Goal: Ability to manage health-related needs will improve Outcome: Progressing   Problem: Metabolic: Goal: Ability to maintain appropriate glucose levels will improve Outcome: Progressing   Problem: Nutritional: Goal: Maintenance of adequate nutrition will improve Outcome: Progressing Goal: Progress toward achieving an optimal weight will improve Outcome: Progressing   Problem: Skin Integrity: Goal: Risk for impaired skin integrity will decrease Outcome: Progressing   Problem: Tissue Perfusion: Goal: Adequacy of tissue perfusion will improve Outcome: Progressing   Problem: Clinical Measurements: Goal: Ability to maintain clinical measurements within normal limits will improve Outcome: Progressing Goal: Will remain free from infection Outcome: Progressing Goal: Diagnostic test results will improve Outcome: Progressing Goal: Respiratory complications will improve Outcome: Progressing Goal: Cardiovascular complication will be avoided Outcome: Progressing   Problem: Nutrition: Goal: Adequate nutrition will be maintained Outcome: Progressing   Problem: Elimination: Goal: Will not experience complications related to bowel motility Outcome: Progressing Goal: Will not experience complications related to urinary retention Outcome: Progressing   Problem: Pain  Managment: Goal: General experience of comfort will improve and/or be controlled Outcome: Progressing   Problem: Safety: Goal: Ability to remain free from injury will improve Outcome: Progressing   Problem: Skin Integrity: Goal: Risk for impaired skin integrity will decrease Outcome: Progressing

## 2023-09-24 NOTE — Progress Notes (Signed)
 Patient was lethargic this morning but patient is currently sitting up enjoying her lunch, morning meds were given. Will continue to monitor

## 2023-09-24 NOTE — TOC Progression Note (Addendum)
 Transition of Care [facility]) - Progression Note    Patient Details  Name: Jaclyn [name] MRN: [id number] Date of Birth: [birth year]  Transition of Care [facility]) CM/SW Contact  Isaiah Public, LCSWA Phone Number: 09/24/2023, 11:48 AM  Clinical Narrative:     Due to patients current orientation CSW spoke with patients son Elouise who confirmed that patient is from [facility] and that plan is to return when medically stable for dc. CSW discussed PT recommendations for short term rehab. [name] agreeable for patient to receive some short term rehab when she returns to SNF. CSW informed Nicki with [facility]. Nicki confirmed that patient can return back if shara is still pending for SNF if patient medically ready. CSW requested for Jon CMA to start insurance authorization for SNF. CSW will continue to follow.  UpdateGLENWOOD Jon CMA informed CSW that patients insurance authorization currently pending for SNF [facility] PI#[id number].  Update- CMA Jon informed CSW patients insurance authorization has been Approved 8/7 - 8/11 [facility] PI#[id number] . Nicki with [facility] can accept patient when medically ready. CSW informed MD.    Expected Discharge Plan: Long Term Nursing Home Barriers to Discharge: Continued Medical Work up               Expected Discharge Plan and Services In-house Referral: Clinical Social Work   Post Acute Care Choice: Skilled Nursing Facility, Nursing Home Living arrangements for the past 2 months: Skilled Nursing Facility                                       Social Drivers of Health (SDOH) Interventions SDOH Screenings   Food Insecurity: No Food Insecurity (09/21/2023)  Housing: Low Risk  (09/21/2023)  Transportation Needs: No Transportation Needs (09/21/2023)  Utilities: Not At Risk (09/21/2023)  Depression (PHQ2-9): Medium Risk (09/10/2018)  Financial Resource Strain: High Risk (02/06/2021)  Social Connections: Patient Declined (09/21/2023)  Tobacco Use: Low  Risk  (09/20/2023)    Readmission Risk Interventions     No data to display

## 2023-09-24 NOTE — Plan of Care (Signed)
  Problem: Fluid Volume: Goal: Ability to maintain a balanced intake and output will improve Outcome: Progressing   Problem: Nutritional: Goal: Maintenance of adequate nutrition will improve Outcome: Progressing   Problem: Skin Integrity: Goal: Risk for impaired skin integrity will decrease Outcome: Progressing   Problem: Clinical Measurements: Goal: Will remain free from infection Outcome: Progressing

## 2023-09-24 NOTE — Progress Notes (Signed)
 PROGRESS NOTE Jaclyn [name]  FMW:[id number] DOB: 1935-10-20 DOA: 09/20/2023 PCP: System, Provider Not In  Brief Narrative/Hospital Course:  88 y.o. 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  Patient has had similar issue in the past due to UTI, however she has been combative and hallucinating. In the ED hypertensive in 80s needing IV fluids and improved workup showed evidence of UTI UDS negative labs with hyperglycemia mild lactic acidosis COVID influenza RSV PCR negative blood culture sent patient was admitted on IV antibiotics.  Chest x-ray with no active disease.  Subjective: Seen and examined today She is really hard of hearing, has no complaint Overnight afebrile BP stable blood sugar remains elevated labs overall unremarkable  Assessment and plan:  UTI w/ sepsis history of recurrent UTIs Acute metabolic encephalopathy due to UTI History of dementia: Initially hypotensive, resolved with IV fluid resuscitation. urine cultures returned. >100k strep, 80k E faecalis (R:vanc)> changed Abx to ampicillin  continue to complete the course, blood culture NGTD CT head no acute finding and TSH normal.  Mentation overall improving Now less lethargic.  Continue to avoid sedatives opiates Reportedly dose of home duloxetine  was doubled 4 days ago but no signs of serotonin syndrome> duloxetine  on hold for now.     Paroxysmal A-fib Continue Eliquis     IDDM type 2: PTA on Januvia  and metformin  Lantus  18 units at bedtime>poorly controlled so added Premeal insulin  and increased her Semglee   AND ON SSI. Adjust insulin  further today Recent Labs  Lab 09/21/23 0228 09/21/23 0609 09/23/23 0604 09/23/23 1111 09/23/23 1613 09/23/23 2105 09/24/23 0555   GLUCAP  --    < > 410* 251* 339* 281* 311*  HGBA1C 8.0*  --   --   --   --   --   --    < > = values in this interval not displayed.     Chronic normocytic anemia Hemoglobin at baseline.   Hypothyroidism: Euthyroid.  Resume Synthroid   Mobility: PT Orders: Active PT Follow up Rec: Skilled Nursing-Short Term Rehab (<3 Hours/Day)09/22/2023 1600   DVT prophylaxis: Eliquis  Code Status:   Code Status: Full Code Family Communication: plan of care discussed with patient/son at bedside. Patient status is: Remains hospitalized because of severity of illness Level of care: Progressive   Dispo: The patient is from: From nursing home            Anticipated disposition: Anticipating discharge to SNF tomorrow of remains stable  Objective: Vitals last 24 hrs: Vitals:   09/23/23 1925 09/23/23 2308 09/24/23 0435 09/24/23 0744  BP: (!) 130/59 (!) 139/51 110/68 (!) 136/56  Pulse: 71 78 77 73  Resp: 14 15 14 15   Temp: 98.3 F (36.8 C) 98.7 F (37.1 C) 97.9 F (36.6 C) 97.8 F (36.6 C)  TempSrc: Oral Oral Axillary Oral  SpO2: 94% 94% 94% 94%  Weight:      Height:        Physical Examination: General exam: aaox1-2,pleasant,very hard of hearing HEENT:Oral mucosa moist, Ear/Nose WNL grossly Respiratory system: B/L clear BS no use of accessory muscle Cardiovascular system: S1 & S2 +, No JVD. Gastrointestinal system: Abdomen soft,NT,ND, BS+ Nervous System: Alert, awake, moving all extremities,and following commands. Extremities: LE edema neg, distal extremities warm.  Skin: No rashes,no icterus. MSK: Normal muscle bulk,tone, power  Medications reviewed:  Scheduled Meds:  apixaban   5 mg Oral BID   Chlorhexidine  Gluconate Cloth  6 each Topical Daily   cholecalciferol   5,000 Units Oral Weekly   cyanocobalamin   2,000 mcg Oral Daily   insulin  aspart  0-5 Units Subcutaneous QHS   insulin  aspart  0-9 Units Subcutaneous TID WC   insulin  aspart  4 Units Subcutaneous TID WC   insulin   glargine-yfgn  22 Units Subcutaneous Daily   levothyroxine   150 mcg Oral Daily   mupirocin  ointment  1 Application Nasal BID   omega-3 acid ethyl esters  1 g Oral Daily   Continuous Infusions:  ampicillin  (OMNIPEN) IV 1 g (09/24/23 0606)   Diet: Diet Order             Diet Carb Modified Fluid consistency: Thin; Room service appropriate? Yes  Diet effective now                    Data Reviewed: I have personally reviewed following labs and imaging studies ( see epic result tab) CBC: Recent Labs  Lab 09/20/23 2001 09/21/23 0228 09/22/23 0320 09/23/23 0233 09/24/23 0313  WBC 8.0 8.0 7.2 6.3 7.1  NEUTROABS 5.1  --   --   --   --   HGB 10.5* 10.1* 9.9* 9.6* 9.9*  HCT 32.6* 31.6* 30.4* 28.9* 29.7*  MCV 94.2 93.2 91.6 90.6 91.4  PLT 267 249 250 252 237   CMP: Recent Labs  Lab 09/20/23 2001 09/21/23 0228 09/22/23 0320 09/23/23 0233 09/24/23 0313  NA 138 138 139 135 138  K 4.9 3.8 3.7 3.9 3.6  CL 103 105 108 103 104  CO2 24 22 24 23 24   GLUCOSE 272* 285* 225* 383* 314*  BUN 13 12 8 8  7*  CREATININE 0.79 0.76 0.68 0.77 0.79  CALCIUM  8.9 8.8* 8.8* 8.9 8.7*   GFR: Estimated Creatinine Clearance: 46 mL/min (by C-G formula based on SCr of 0.79 mg/dL). Recent Labs  Lab 09/20/23 2001 09/21/23 0228 09/22/23 0320  AST 34 23 19  ALT 16 18 16   ALKPHOS 71 71 65  BILITOT 1.5* 0.8 0.7  PROT 6.2* 6.2* 6.3*  ALBUMIN 2.8* 2.8* 2.7*   No results for input(s): LIPASE, AMYLASE in the last 168 hours.  Recent Labs  Lab 09/22/23 0320  AMMONIA 21   Coagulation Profile:  Recent Labs  Lab 09/20/23 2019  INR 1.3*   Unresulted Labs (From admission, onward)     Start     Ordered   09/23/23 0500  Basic metabolic panel with GFR  Daily,   R     Question:  Specimen collection method  Answer:  Lab=Lab collect   09/22/23 0915   09/23/23 0500  CBC  Daily,   R     Question:  Specimen collection method  Answer:  Lab=Lab collect   09/22/23 0915            Antimicrobials/Microbiology: Anti-infectives (From admission, onward)    Start     Dose/Rate Route Frequency Ordered Stop   09/23/23 1407  ampicillin  (OMNIPEN) 1 g in sodium chloride  0.9 % 100 mL IVPB        1 g 300 mL/hr over 20 Minutes Intravenous Every 8 hours 09/23/23 1407 09/26/23 0559   09/21/23 1000  cefTRIAXone  (ROCEPHIN ) 1 g in sodium chloride  0.9 % 100 mL IVPB  Status:  Discontinued        1 g 200 mL/hr over 30 Minutes Intravenous Every 24  hours 09/21/23 0054 09/23/23 1407   09/20/23 2030  ceFEPIme  (MAXIPIME ) 2 g in sodium chloride  0.9 % 100 mL IVPB        2 g 200 mL/hr over 30 Minutes Intravenous  Once 09/20/23 2019 09/20/23 2156   09/20/23 2030  metroNIDAZOLE  (FLAGYL ) IVPB 500 mg        500 mg 100 mL/hr over 60 Minutes Intravenous  Once 09/20/23 2019 09/21/23 0055   09/20/23 2030  vancomycin  (VANCOCIN ) IVPB 1000 mg/200 mL premix        1,000 mg 200 mL/hr over 60 Minutes Intravenous  Once 09/20/23 2019 09/20/23 2309         Component Value Date/Time   SDES BLOOD RIGHT FOREARM 09/20/2023 2110   SPECREQUEST  09/20/2023 2110    BOTTLES DRAWN AEROBIC AND ANAEROBIC Blood Culture adequate volume   CULT  09/20/2023 2110    NO GROWTH 4 DAYS Performed at [facility], [address], [place], [place] [address]    REPTSTATUS PENDING 09/20/2023 2110     Mennie LAMY, MD [facility] 09/24/2023, 1:18 PM

## 2023-09-25 DIAGNOSIS — N390 Urinary tract infection, site not specified: Secondary | ICD-10-CM | POA: Diagnosis not present

## 2023-09-25 DIAGNOSIS — R319 Hematuria, unspecified: Secondary | ICD-10-CM | POA: Diagnosis not present

## 2023-09-25 LAB — CULTURE, BLOOD (ROUTINE X 2)
Culture: NO GROWTH
Culture: NO GROWTH
Special Requests: ADEQUATE
Special Requests: ADEQUATE

## 2023-09-25 LAB — BASIC METABOLIC PANEL WITH GFR
Anion gap: 12 (ref 5–15)
BUN: 11 mg/dL (ref 8–23)
CO2: 25 mmol/L (ref 22–32)
Calcium: 9.2 mg/dL (ref 8.9–10.3)
Chloride: 106 mmol/L (ref 98–111)
Creatinine, Ser: 0.83 mg/dL (ref 0.44–1.00)
GFR, Estimated: >60 mL/min (ref >60)
Glucose, Bld: 249 mg/dL — ABNORMAL HIGH (ref 70–99)
Potassium: 3.8 mmol/L (ref 3.5–5.1)
Sodium: 143 mmol/L (ref 135–145)

## 2023-09-25 LAB — GLUCOSE, CAPILLARY
Glucose-Capillary: 329 mg/dL — ABNORMAL HIGH (ref 70–99)
Glucose-Capillary: 213 mg/dL — ABNORMAL HIGH (ref 70–99)
Glucose-Capillary: 108 mg/dL — ABNORMAL HIGH (ref 70–99)
Glucose-Capillary: 115 mg/dL — ABNORMAL HIGH (ref 70–99)

## 2023-09-25 LAB — CBC
HCT: 30.7 % — ABNORMAL LOW (ref 36.0–46.0)
Hemoglobin: 10.2 g/dL — ABNORMAL LOW (ref 12.0–15.0)
MCH: 30.1 pg (ref 26.0–34.0)
MCHC: 33.2 g/dL (ref 30.0–36.0)
MCV: 90.6 fL (ref 80.0–100.0)
Platelets: 251 K/uL (ref 150–400)
RBC: 3.39 MIL/uL — ABNORMAL LOW (ref 3.87–5.11)
RDW: 15.2 % (ref 11.5–15.5)
WBC: 7.9 K/uL (ref 4.0–10.5)
nRBC: 0 % (ref 0.0–0.2)

## 2023-09-25 MED ORDER — ONDANSETRON HCL 4 MG/2ML IJ SOLN
4.0000 mg | Freq: Four times a day (QID) | INTRAMUSCULAR | Status: DC | PRN
  Administered 2023-09-25: 4 mg via INTRAVENOUS
  Filled 2023-09-25: qty 2

## 2023-09-25 MED ORDER — INSULIN ASPART 100 UNIT/ML IJ SOLN
12.0000 [IU] | Freq: Three times a day (TID) | INTRAMUSCULAR | Status: DC
  Administered 2023-09-25 – 2023-09-26 (×3): 12 [IU] via SUBCUTANEOUS

## 2023-09-25 MED ORDER — INSULIN GLARGINE-YFGN 100 UNIT/ML ~~LOC~~ SOLN
36.0000 [IU] | Freq: Every day | SUBCUTANEOUS | Status: DC
  Administered 2023-09-25: 36 [IU] via SUBCUTANEOUS
  Filled 2023-09-25 (×2): qty 0.36

## 2023-09-25 MED ORDER — METFORMIN HCL 500 MG PO TABS
1000.0000 mg | ORAL_TABLET | Freq: Two times a day (BID) | ORAL | Status: DC
  Administered 2023-09-25 – 2023-09-26 (×3): 1000 mg via ORAL
  Filled 2023-09-25 (×3): qty 2

## 2023-09-25 MED ORDER — FOSFOMYCIN TROMETHAMINE 3 G PO PACK
3.0000 g | PACK | Freq: Once | ORAL | Status: AC
  Administered 2023-09-25: 3 g via ORAL
  Filled 2023-09-25: qty 3

## 2023-09-25 MED ORDER — LINAGLIPTIN 5 MG PO TABS
5.0000 mg | ORAL_TABLET | Freq: Every day | ORAL | Status: DC
  Administered 2023-09-25 – 2023-09-26 (×2): 5 mg via ORAL
  Filled 2023-09-25 (×2): qty 1

## 2023-09-25 NOTE — Progress Notes (Signed)
 Physical Therapy Treatment Patient Details Name: Jaclyn [name] MRN: [id number] DOB: March 30, 1935 Today's Date: 09/25/2023   History of Present Illness 88 y.o. female presenting 8/3 to the ED for altered mental status.  Found to have UTI w/ sepsis  history of recurrent UTIs  Acute metabolic encephalopathy due to UTI. PMH significant of dementia, paroxysmal A-fib on Eliquis , fibromyalgia, hypertension, poorly controlled type 2 diabetes, hypothyroidism, TIA    PT Comments  Pt without significant progress towards goals this session. Pt was ill during session with vomiting which may have contributed to decreased functional abilities this session. Currently pt is Mod A for supine to sitting, Min A for rolling and sitting to supine. Pt was able to stand 1x at Total A reporting feelings of pain but difficult to describe; upon sitting and being assisted to side lying pt reports she is going to vomit. RN in room tech and RN assisted with pt rolling in order to clean pt. Due to pt current functional status, home set up and available assistance at home recommending skilled physical therapy services < 3 hours/day in order to address strength, balance and functional mobility to decrease risk for falls, injury, immobility, skin break down and re-hospitalization.      If plan is discharge home, recommend the following: Two people to help with walking and/or transfers;Assist for transportation;Supervision due to cognitive status;Assistance with cooking/housework   Can travel by private vehicle     No  Equipment Recommendations  None recommended by PT       Precautions / Restrictions Precautions Precautions: Fall Recall of Precautions/Restrictions: Impaired Restrictions Weight Bearing Restrictions Per Provider Order: No     Mobility  Bed Mobility Overal bed mobility: Needs Assistance Bed Mobility: Rolling, Sidelying to Sit, Sit to Sidelying Rolling: Min assist Sidelying to sit: Mod assist     Sit to  sidelying: Min assist General bed mobility comments: Min assist to roll Lt and Rt in bed, cues to use rail and pull. Mod assist for trunk support to rise, Min assist for LEs back into bed into sidelying position. Heavy cues for leaning forward with trunk to mid line in order to prevent sliding off EOB.    Transfers Overall transfer level: Needs assistance Equipment used: Rolling walker (2 wheels), 1 person hand held assist Transfers: Sit to/from Stand Sit to Stand: Total assist           General transfer comment: max assist for momentum to get to standing and balance with significant retropulsion. Max multimodal cues to work on forward weight shifting techniques. Pt standing EOB for brief period of time for RN to get sheets under pt due to saturation from urine. Unable to continue due to pt feeling poor and assisted to supine where pt vomiting then had bowel urgency    Ambulation/Gait   General Gait Details: unable to progress      Balance Overall balance assessment: Needs assistance Sitting-balance support: No upper extremity supported, Feet supported Sitting balance-Leahy Scale: Poor Sitting balance - Comments: Mod A initially intermittently CGA for 1-2 Min then requiring Mod A for midline.   Standing balance support: Bilateral upper extremity supported, Single extremity supported, Reliant on assistive device for balance Standing balance-Leahy Scale: Zero Standing balance comment: and reliant on external support.        Communication Communication Communication: Impaired Factors Affecting Communication: Hearing impaired  Cognition Arousal: Alert Behavior During Therapy: Anxious   PT - Cognitive impairments: Memory, Awareness, Attention, Initiation, Sequencing, Problem solving, Safety/Judgement  Following commands: Impaired Following commands impaired: Follows one step commands with increased time, Follows one step commands inconsistently    Cueing Cueing  Techniques: Verbal cues, Gestural cues, Tactile cues, Visual cues     General Comments General comments (skin integrity, edema, etc.): Assisted RN /Tech with cleaning for urine, pt then had an episode of vomit and needed a new linen change. After linen change pt was assisted with rolling again due to bowel urgency      Pertinent Vitals/Pain Pain Assessment Pain Assessment: Faces Faces Pain Scale: Hurts little more Pain Location: abdomen right before and after vomiting. Pain Descriptors / Indicators: Aching Pain Intervention(s): Monitored during session, Limited activity within patient's tolerance, Repositioned     PT Goals (current goals can now be found in the care plan section) Acute Rehab PT Goals Patient Stated Goal: Son wishes for pt to be able to transfer with him to/from w/c in order to take her out of SNF on weekends. PT Goal Formulation: With patient/family Time For Goal Achievement: 10/06/23 Potential to Achieve Goals: Fair Progress towards PT goals: Progressing toward goals    Frequency    Min 1X/week      PT Plan  Continue with current POC        AM-PAC PT 6 Clicks Mobility   Outcome Measure  Help needed turning from your back to your side while in a flat bed without using bedrails?: A Little Help needed moving from lying on your back to sitting on the side of a flat bed without using bedrails?: A Lot Help needed moving to and from a bed to a chair (including a wheelchair)?: Total Help needed standing up from a chair using your arms (e.g., wheelchair or bedside chair)?: Total Help needed to walk in hospital room?: Total Help needed climbing 3-5 steps with a railing? : Total 6 Click Score: 9    End of Session Equipment Utilized During Treatment: Gait belt Activity Tolerance: Patient tolerated treatment well;Other (comment) (limited due to nausea/diarrhea)   Nurse Communication: Mobility status PT Visit Diagnosis: Unsteadiness on feet (R26.81);Other  abnormalities of gait and mobility (R26.89);Muscle weakness (generalized) (M62.81);Difficulty in walking, not elsewhere classified (R26.2);Other symptoms and signs involving the nervous system (R29.898)     Time: 8697-8648 PT Time Calculation (min) (ACUTE ONLY): 49 min  Charges:    $Therapeutic Activity: 38-52 mins PT General Charges $$ ACUTE PT VISIT: 1 Visit                     Dorothyann Maier, DPT, CLT  Acute Rehabilitation Services Office: [phone] (Secure chat preferred)    Dorothyann VEAR Maier 09/25/2023, 2:07 PM

## 2023-09-25 NOTE — Plan of Care (Signed)
  Problem: Fluid Volume: Goal: Ability to maintain a balanced intake and output will improve Outcome: Progressing   Problem: Nutritional: Goal: Maintenance of adequate nutrition will improve Outcome: Progressing   Problem: Clinical Measurements: Goal: Will remain free from infection Outcome: Progressing

## 2023-09-25 NOTE — TOC Progression Note (Addendum)
 Transition of Care [facility]) - Progression Note    Patient Details  Name: Jaclyn [name] MRN: [id number] Date of Birth: 15-Dec-1935  Transition of Care [facility]) CM/SW Contact  Lauraine FORBES Saa, LCSWA Phone Number: 09/25/2023, 2:20 PM  Clinical Narrative:     2:20 PM Per hospitalist, patient is expected to discharge to [facility] tomorrow. SNF admissions and patient's son, Elouise, made aware. Elouise was agreeable with using ambulance transport for discharge and made aware of potential copay to be billed upon service. CSW will continue to follow and be available to assist.  Expected Discharge Plan: Long Term Nursing Home Barriers to Discharge: Continued Medical Work up               Expected Discharge Plan and Services In-house Referral: Clinical Social Work   Post Acute Care Choice: Skilled Nursing Facility, Nursing Home Living arrangements for the past 2 months: Skilled Nursing Facility                                       Social Drivers of Health (SDOH) Interventions SDOH Screenings   Food Insecurity: No Food Insecurity (09/21/2023)  Housing: Low Risk  (09/21/2023)  Transportation Needs: No Transportation Needs (09/21/2023)  Utilities: Not At Risk (09/21/2023)  Depression (PHQ2-9): Medium Risk (09/10/2018)  Financial Resource Strain: High Risk (02/06/2021)  Social Connections: Patient Declined (09/21/2023)  Tobacco Use: Low Risk  (09/20/2023)    Readmission Risk Interventions     No data to display

## 2023-09-25 NOTE — Progress Notes (Signed)
 PROGRESS NOTE Jaclyn [name]  FMW:[id number] DOB: 08/11/1935 DOA: 09/20/2023 PCP: System, Provider Not In  Brief Narrative/Hospital Course:  88 y.o. 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  Patient has had similar issue in the past due to UTI, however she has been combative and hallucinating. In the ED hypertensive in 80s needing IV fluids and improved workup showed evidence of UTI UDS negative labs with hyperglycemia mild lactic acidosis COVID influenza RSV PCR negative blood culture sent patient was admitted on IV antibiotics.  Chest x-ray with no active disease. Patient urine culture grew strep and faecalis antibiotics subsequently adjusted to ampicillin  to cover both Overall mentation improving, plan is for discharge to skilled nursing facility She had uncontrolled hyperglycemia her home insulin  regimen were adjusted and, Premeal insulin  added.  Subjective: Seen and examined today Overnight no acute events but blood sugar remains poorly controlled 170 last night this morning and 300 despite increasing insulin  regimen.  BP soft but on recheck personally it was in 110 after cuff adjustment.  Assessment and plan:  UTI w/ sepsis history of recurrent UTIs Acute metabolic encephalopathy due to UTI History of dementia: CT head no acute finding and TSH normal. Initially hypotensive, resolved with IV fluid resuscitation. Mentation overall improved.  Urine cultures>100k strep, 80k E faecalis (R:vanc)>Cont Ampicillin , blood culture NGTD Has been more alert awake, oral intake picking up Reportedly dose of home duloxetine  was doubled 4 days ago but no signs of serotonin syndrome> duloxetine  on hold for now.     Paroxysmal A-fib: Continue Eliquis     Intermittent soft blood pressure Nausea vomiting x 1: Patient started to vomit after getting up current work with PT. hold off on discharge today BP has been soft.  If recurrent will start IV fluids   IDDM type 2: PTA on Januvia  and metformin  Lantus  18 units at bedtime>poorly controlled ere- and remains uncontrolled despite adjusting meds.  Increasing further: Premeal insulin  at 12 units tid, and seemingly at 36 u, resumed home po meds.  Will need close follow-up at the facility. Son reports patient has been eating regularly in the hospital. Recent Labs  Lab 09/21/23 0228 09/21/23 0609 09/24/23 1141 09/24/23 1655 09/24/23 2126 09/25/23 0549 09/25/23 1121  GLUCAP  --    < > 261* 175* 170* 329* 213*  HGBA1C 8.0*  --   --   --   --   --   --    < > = values in this interval not displayed.     Chronic normocytic anemia Hemoglobin at baseline.   Hypothyroidism: Euthyroid.  Resume Synthroid   Mobility: PT Orders: Active PT Follow up Rec: Skilled Nursing-Short Term Rehab (<3 Hours/Day)09/22/2023 1600   DVT prophylaxis: Eliquis  Code Status:   Code Status: Full Code Family Communication: plan of care discussed with patient/son at bedside. Patient status is: Remains hospitalized because of severity of illness Level of care: Progressive   Dispo: The patient is from: From nursing home            Anticipated disposition: Anticipating discharge to SNF in am if stable.  Objective: Vitals last 24 hrs: Vitals:   09/25/23 0811 09/25/23 0910 09/25/23 1022 09/25/23 1123  BP: (!) 78/52 111/81  (!) 87/65  Pulse: 75 86 71 73  Resp: 12 16 12 15   Temp: 98.7  F (37.1 C)   98.8 F (37.1 C)  TempSrc: Oral   Oral  SpO2: 93%  96% 100%  Weight:      Height:        Physical Examination: General exam: Alert awake oriented x 1-2 HEENT:Oral mucosa moist, Ear/Nose WNL grossly Respiratory system: B/L clear BS no use of accessory muscle Cardiovascular system: S1 & S2 +, No  JVD. Gastrointestinal system: Abdomen soft,NT,ND, BS+ Nervous System: Alert, awake, moving extremities  Extremities: LE edema neg, distal extremities warm.  Skin: No rashes,no icterus. MSK: Normal muscle bulk,tone, power   Medications reviewed:  Scheduled Meds:  apixaban   5 mg Oral BID   cholecalciferol   5,000 Units Oral Weekly   cyanocobalamin   2,000 mcg Oral Daily   fosfomycin  3 g Oral Once   insulin  aspart  0-5 Units Subcutaneous QHS   insulin  aspart  0-9 Units Subcutaneous TID WC   insulin  aspart  12 Units Subcutaneous TID WC   insulin  glargine-yfgn  36 Units Subcutaneous Daily   levothyroxine   150 mcg Oral Daily   linagliptin   5 mg Oral Daily   metFORMIN   1,000 mg Oral BID WC   mupirocin  ointment  1 Application Nasal BID   omega-3 acid ethyl esters  1 g Oral Daily   Continuous Infusions:   Diet: Diet Order             Diet Carb Modified Fluid consistency: Thin; Room service appropriate? Yes  Diet effective now                    Data Reviewed: I have personally reviewed following labs and imaging studies ( see epic result tab) CBC: Recent Labs  Lab 09/20/23 2001 09/21/23 0228 09/22/23 0320 09/23/23 0233 09/24/23 0313 09/25/23 0252  WBC 8.0 8.0 7.2 6.3 7.1 7.9  NEUTROABS 5.1  --   --   --   --   --   HGB 10.5* 10.1* 9.9* 9.6* 9.9* 10.2*  HCT 32.6* 31.6* 30.4* 28.9* 29.7* 30.7*  MCV 94.2 93.2 91.6 90.6 91.4 90.6  PLT 267 249 250 252 237 251   CMP: Recent Labs  Lab 09/21/23 0228 09/22/23 0320 09/23/23 0233 09/24/23 0313 09/25/23 0252  NA 138 139 135 138 143  K 3.8 3.7 3.9 3.6 3.8  CL 105 108 103 104 106  CO2 22 24 23 24 25   GLUCOSE 285* 225* 383* 314* 249*  BUN 12 8 8  7* 11  CREATININE 0.76 0.68 0.77 0.79 0.83  CALCIUM  8.8* 8.8* 8.9 8.7* 9.2   GFR: Estimated Creatinine Clearance: 44.3 mL/min (by C-G formula based on SCr of 0.83 mg/dL). Recent Labs  Lab 09/20/23 2001 09/21/23 0228 09/22/23 0320  AST 34 23 19  ALT 16 18 16   ALKPHOS 71 71  65  BILITOT 1.5* 0.8 0.7  PROT 6.2* 6.2* 6.3*  ALBUMIN 2.8* 2.8* 2.7*   No results for input(s): LIPASE, AMYLASE in the last 168 hours.  Recent Labs  Lab 09/22/23 0320  AMMONIA 21   Coagulation Profile:  Recent Labs  Lab 09/20/23 2019  INR 1.3*   Unresulted Labs (From admission, onward)     Start     Ordered   09/23/23 0500  Basic metabolic panel with GFR  Daily,   R     Question:  Specimen collection method  Answer:  Lab=Lab collect   09/22/23 0915   09/23/23 0500  CBC  Daily,   R     Question:  Specimen collection method  Answer:  Lab=Lab collect   09/22/23 0915           Antimicrobials/Microbiology: Anti-infectives (From admission, onward)    Start     Dose/Rate Route Frequency Ordered Stop   09/25/23 1200  fosfomycin (MONUROL ) packet 3 g        3 g Oral  Once 09/25/23 1112     09/23/23 1407  ampicillin  (OMNIPEN) 1 g in sodium chloride  0.9 % 100 mL IVPB  Status:  Discontinued        1 g 300 mL/hr over 20 Minutes Intravenous Every 8 hours 09/23/23 1407 09/25/23 1112   09/21/23 1000  cefTRIAXone  (ROCEPHIN ) 1 g in sodium chloride  0.9 % 100 mL IVPB  Status:  Discontinued        1 g 200 mL/hr over 30 Minutes Intravenous Every 24 hours 09/21/23 0054 09/23/23 1407   09/20/23 2030  ceFEPIme  (MAXIPIME ) 2 g in sodium chloride  0.9 % 100 mL IVPB        2 g 200 mL/hr over 30 Minutes Intravenous  Once 09/20/23 2019 09/20/23 2156   09/20/23 2030  metroNIDAZOLE  (FLAGYL ) IVPB 500 mg        500 mg 100 mL/hr over 60 Minutes Intravenous  Once 09/20/23 2019 09/21/23 0055   09/20/23 2030  vancomycin  (VANCOCIN ) IVPB 1000 mg/200 mL premix        1,000 mg 200 mL/hr over 60 Minutes Intravenous  Once 09/20/23 2019 09/20/23 2309         Component Value Date/Time   SDES BLOOD RIGHT FOREARM 09/20/2023 2110   SPECREQUEST  09/20/2023 2110    BOTTLES DRAWN AEROBIC AND ANAEROBIC Blood Culture adequate volume   CULT  09/20/2023 2110    NO GROWTH 5 DAYS Performed at [facility], [address] [place], [place] [address]    REPTSTATUS 09/25/2023 FINAL 09/20/2023 2110     Mennie LAMY, MD [facility] 09/25/2023, 1:34 PM

## 2023-09-26 DIAGNOSIS — I959 Hypotension, unspecified: Secondary | ICD-10-CM | POA: Diagnosis not present

## 2023-09-26 DIAGNOSIS — B961 Klebsiella pneumoniae [K. pneumoniae] as the cause of diseases classified elsewhere: Secondary | ICD-10-CM | POA: Diagnosis not present

## 2023-09-26 DIAGNOSIS — R2689 Other abnormalities of gait and mobility: Secondary | ICD-10-CM | POA: Diagnosis not present

## 2023-09-26 DIAGNOSIS — E785 Hyperlipidemia, unspecified: Secondary | ICD-10-CM | POA: Diagnosis not present

## 2023-09-26 DIAGNOSIS — E119 Type 2 diabetes mellitus without complications: Secondary | ICD-10-CM | POA: Diagnosis not present

## 2023-09-26 DIAGNOSIS — R4182 Altered mental status, unspecified: Secondary | ICD-10-CM | POA: Diagnosis not present

## 2023-09-26 DIAGNOSIS — Z91040 Latex allergy status: Secondary | ICD-10-CM | POA: Diagnosis not present

## 2023-09-26 DIAGNOSIS — Z7401 Bed confinement status: Secondary | ICD-10-CM | POA: Diagnosis not present

## 2023-09-26 DIAGNOSIS — R451 Restlessness and agitation: Secondary | ICD-10-CM | POA: Diagnosis not present

## 2023-09-26 DIAGNOSIS — I672 Cerebral atherosclerosis: Secondary | ICD-10-CM | POA: Diagnosis not present

## 2023-09-26 DIAGNOSIS — F22 Delusional disorders: Secondary | ICD-10-CM | POA: Diagnosis not present

## 2023-09-26 DIAGNOSIS — E1165 Type 2 diabetes mellitus with hyperglycemia: Secondary | ICD-10-CM | POA: Diagnosis not present

## 2023-09-26 DIAGNOSIS — I480 Paroxysmal atrial fibrillation: Secondary | ICD-10-CM | POA: Diagnosis not present

## 2023-09-26 DIAGNOSIS — R442 Other hallucinations: Secondary | ICD-10-CM | POA: Diagnosis not present

## 2023-09-26 DIAGNOSIS — R627 Adult failure to thrive: Secondary | ICD-10-CM | POA: Diagnosis not present

## 2023-09-26 DIAGNOSIS — F0390 Unspecified dementia without behavioral disturbance: Secondary | ICD-10-CM | POA: Diagnosis not present

## 2023-09-26 DIAGNOSIS — E039 Hypothyroidism, unspecified: Secondary | ICD-10-CM | POA: Diagnosis not present

## 2023-09-26 DIAGNOSIS — M1990 Unspecified osteoarthritis, unspecified site: Secondary | ICD-10-CM | POA: Diagnosis not present

## 2023-09-26 DIAGNOSIS — N3010 Interstitial cystitis (chronic) without hematuria: Secondary | ICD-10-CM | POA: Diagnosis not present

## 2023-09-26 DIAGNOSIS — F10929 Alcohol use, unspecified with intoxication, unspecified: Secondary | ICD-10-CM | POA: Diagnosis not present

## 2023-09-26 DIAGNOSIS — I700 Atherosclerosis of aorta: Secondary | ICD-10-CM | POA: Diagnosis not present

## 2023-09-26 DIAGNOSIS — R1312 Dysphagia, oropharyngeal phase: Secondary | ICD-10-CM | POA: Diagnosis not present

## 2023-09-26 DIAGNOSIS — I69352 Hemiplegia and hemiparesis following cerebral infarction affecting left dominant side: Secondary | ICD-10-CM | POA: Diagnosis not present

## 2023-09-26 DIAGNOSIS — R0602 Shortness of breath: Secondary | ICD-10-CM | POA: Diagnosis not present

## 2023-09-26 DIAGNOSIS — R410 Disorientation, unspecified: Secondary | ICD-10-CM | POA: Diagnosis not present

## 2023-09-26 DIAGNOSIS — R251 Tremor, unspecified: Secondary | ICD-10-CM | POA: Diagnosis not present

## 2023-09-26 DIAGNOSIS — I10 Essential (primary) hypertension: Secondary | ICD-10-CM | POA: Diagnosis not present

## 2023-09-26 DIAGNOSIS — R41841 Cognitive communication deficit: Secondary | ICD-10-CM | POA: Diagnosis not present

## 2023-09-26 DIAGNOSIS — M6281 Muscle weakness (generalized): Secondary | ICD-10-CM | POA: Diagnosis not present

## 2023-09-26 DIAGNOSIS — I499 Cardiac arrhythmia, unspecified: Secondary | ICD-10-CM | POA: Diagnosis not present

## 2023-09-26 DIAGNOSIS — N390 Urinary tract infection, site not specified: Secondary | ICD-10-CM | POA: Diagnosis not present

## 2023-09-26 DIAGNOSIS — R531 Weakness: Secondary | ICD-10-CM | POA: Diagnosis not present

## 2023-09-26 DIAGNOSIS — M797 Fibromyalgia: Secondary | ICD-10-CM | POA: Diagnosis not present

## 2023-09-26 DIAGNOSIS — F0392 Unspecified dementia, unspecified severity, with psychotic disturbance: Secondary | ICD-10-CM | POA: Diagnosis not present

## 2023-09-26 DIAGNOSIS — R404 Transient alteration of awareness: Secondary | ICD-10-CM | POA: Diagnosis not present

## 2023-09-26 DIAGNOSIS — G9341 Metabolic encephalopathy: Secondary | ICD-10-CM | POA: Diagnosis not present

## 2023-09-26 DIAGNOSIS — Z794 Long term (current) use of insulin: Secondary | ICD-10-CM | POA: Diagnosis not present

## 2023-09-26 DIAGNOSIS — A419 Sepsis, unspecified organism: Secondary | ICD-10-CM | POA: Diagnosis not present

## 2023-09-26 DIAGNOSIS — R319 Hematuria, unspecified: Secondary | ICD-10-CM | POA: Diagnosis not present

## 2023-09-26 DIAGNOSIS — I69328 Other speech and language deficits following cerebral infarction: Secondary | ICD-10-CM | POA: Diagnosis not present

## 2023-09-26 DIAGNOSIS — Z7901 Long term (current) use of anticoagulants: Secondary | ICD-10-CM | POA: Diagnosis not present

## 2023-09-26 DIAGNOSIS — F29 Unspecified psychosis not due to a substance or known physiological condition: Secondary | ICD-10-CM | POA: Diagnosis not present

## 2023-09-26 DIAGNOSIS — Z87440 Personal history of urinary (tract) infections: Secondary | ICD-10-CM | POA: Diagnosis not present

## 2023-09-26 DIAGNOSIS — E46 Unspecified protein-calorie malnutrition: Secondary | ICD-10-CM | POA: Diagnosis not present

## 2023-09-26 DIAGNOSIS — E559 Vitamin D deficiency, unspecified: Secondary | ICD-10-CM | POA: Diagnosis not present

## 2023-09-26 LAB — BASIC METABOLIC PANEL WITH GFR
Anion gap: 10 (ref 5–15)
BUN: 10 mg/dL (ref 8–23)
CO2: 26 mmol/L (ref 22–32)
Calcium: 9.3 mg/dL (ref 8.9–10.3)
Chloride: 102 mmol/L (ref 98–111)
Creatinine, Ser: 0.83 mg/dL (ref 0.44–1.00)
GFR, Estimated: >60 mL/min (ref >60)
Glucose, Bld: 105 mg/dL — ABNORMAL HIGH (ref 70–99)
Potassium: 4.1 mmol/L (ref 3.5–5.1)
Sodium: 138 mmol/L (ref 135–145)

## 2023-09-26 LAB — CBC
HCT: 32.8 % — ABNORMAL LOW (ref 36.0–46.0)
Hemoglobin: 10.7 g/dL — ABNORMAL LOW (ref 12.0–15.0)
MCH: 29.9 pg (ref 26.0–34.0)
MCHC: 32.6 g/dL (ref 30.0–36.0)
MCV: 91.6 fL (ref 80.0–100.0)
Platelets: 300 K/uL (ref 150–400)
RBC: 3.58 MIL/uL — ABNORMAL LOW (ref 3.87–5.11)
RDW: 15.5 % (ref 11.5–15.5)
WBC: 11.1 K/uL — ABNORMAL HIGH (ref 4.0–10.5)
nRBC: 0 % (ref 0.0–0.2)

## 2023-09-26 LAB — GLUCOSE, CAPILLARY
Glucose-Capillary: 167 mg/dL — ABNORMAL HIGH (ref 70–99)
Glucose-Capillary: 207 mg/dL — ABNORMAL HIGH (ref 70–99)

## 2023-09-26 MED ORDER — INSULIN GLARGINE-YFGN 100 UNIT/ML ~~LOC~~ SOLN
30.0000 [IU] | Freq: Every day | SUBCUTANEOUS | Status: DC
  Administered 2023-09-26: 30 [IU] via SUBCUTANEOUS
  Filled 2023-09-26: qty 0.3

## 2023-09-26 MED ORDER — INSULIN ASPART 100 UNIT/ML IJ SOLN
10.0000 [IU] | Freq: Three times a day (TID) | INTRAMUSCULAR | Status: DC
  Administered 2023-09-26: 10 [IU] via SUBCUTANEOUS

## 2023-09-26 MED ORDER — APIXABAN 5 MG PO TABS: 5.0000 mg | ORAL_TABLET | Freq: Two times a day (BID) | ORAL | Status: AC

## 2023-09-26 MED ORDER — LANTUS SOLOSTAR 100 UNIT/ML ~~LOC~~ SOPN: 30.0000 [IU] | PEN_INJECTOR | Freq: Every day | SUBCUTANEOUS | Status: AC

## 2023-09-26 MED ORDER — INSULIN ASPART 100 UNIT/ML FLEXPEN
10.0000 [IU] | PEN_INJECTOR | Freq: Three times a day (TID) | SUBCUTANEOUS | Status: AC
Start: 2023-09-26 — End: ?

## 2023-09-26 MED ORDER — ONDANSETRON HCL 4 MG PO TABS: 4.0000 mg | ORAL_TABLET | Freq: Three times a day (TID) | ORAL | Status: AC | PRN

## 2023-09-26 NOTE — TOC Transition Note (Signed)
 Transition of Care [facility]) - Discharge Note   Patient Details  Name: Jaclyn [name] MRN: [id number] Date of Birth: 1935-10-03  Transition of Care [facility]) CM/SW Contact:  Britt JULIANNA Bennetts, LCSW Phone Number: 09/26/2023, 11:11 AM   Clinical Narrative:    Patient will DC to: [facility] (SNF) Anticipated DC date:  09/26/2023 Family notified: Yes. Son, Teacher, English as a foreign language Transport by: DONALDA   Per MD patient ready for DC to SNF. RN to call report prior to discharge [phone] room [phone] ). RN, patient's family, and facility notified of DC. Discharge Summary sent to facility. Ambulance transport will be requested for patient.   CSW will sign off for now as social work intervention is no longer needed. Please consult us  again if new needs arise.      Barriers to Discharge: Barriers Resolved   Patient Goals and CMS Choice            Discharge Placement              Patient chooses bed at: [facility] Patient to be transferred to facility by: [facility] Name of family member notified: Tamya, Denardo (Son)  [phone] Patient and family notified of of transfer: 09/26/23  Discharge Plan and Services Additional resources added to the After Visit Summary for   In-house Referral: Clinical Social Work   Post Acute Care Choice: Skilled Nursing Facility, Nursing Home                               Social Drivers of Health (SDOH) Interventions SDOH Screenings   Food Insecurity: No Food Insecurity (09/21/2023)  Housing: Low Risk  (09/21/2023)  Transportation Needs: No Transportation Needs (09/21/2023)  Utilities: Not At Risk (09/21/2023)  Depression (PHQ2-9): Medium Risk (09/10/2018)  Financial Resource Strain: High Risk (02/06/2021)  Social Connections: Patient Declined (09/21/2023)  Tobacco Use: Low Risk  (09/20/2023)     Readmission Risk Interventions     No data to display

## 2023-09-26 NOTE — Plan of Care (Signed)
  Problem: Skin Integrity: Goal: Risk for impaired skin integrity will decrease Outcome: Progressing   Problem: Clinical Measurements: Goal: Will remain free from infection Outcome: Progressing   Problem: Pain Managment: Goal: General experience of comfort will improve and/or be controlled Outcome: Progressing   Problem: Education: Goal: Knowledge of General Education information will improve Description: Including pain rating scale, medication(s)/side effects and non-pharmacologic comfort measures Outcome: Not Progressing   Problem: Activity: Goal: Risk for activity intolerance will decrease Outcome: Not Progressing

## 2023-09-26 NOTE — Discharge Summary (Signed)
 Physician Discharge Summary  Jaclyn [name] FMW:[id number] DOB: 1936-01-01 DOA: 09/20/2023  PCP: System, Provider Not In  Admit date: 09/20/2023 Discharge date: 09/26/2023 Recommendations for Outpatient Follow-up:  Follow up with PCP in 1 weeks-call for appointment Please obtain BMP/CBC in one week Monitor blood sugar closely ACHS at SNF  Discharge Dispo: snf Discharge Condition: Stable Code Status:   Code Status: Full Code Diet recommendation:  Diet Order             Diet Carb Modified           Diet Carb Modified Fluid consistency: Thin; Room service appropriate? Yes  Diet effective now                    Brief/Interim Summary:  88 y.o. 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  Patient has had similar issue in the past due to UTI, however she has been combative and hallucinating. In the ED hypertensive in 80s needing IV fluids and improved workup showed evidence of UTI UDS negative labs with hyperglycemia mild lactic acidosis COVID influenza RSV PCR negative blood culture sent patient was admitted on IV antibiotics.  Chest x-ray with no active disease. Patient urine culture grew strep and faecalis antibiotics subsequently adjusted to ampicillin  to cover both Overall mentation improving, plan is for discharge to skilled nursing facility She had uncontrolled hyperglycemia her home insulin  regimen were adjusted and, Premeal insulin  added. At this time she is medically stabilized blood sugar is well-controlled  Subjective: Seen and examined Alert awake oriented pleasant, agreeable for discharge to facility today Overnight no acute events, states she had episode of vomiting and discharge was held Blood pressure stable afebrile blood  sugar in 160s  Discharge diagnosis :  UTI w/ sepsis history of recurrent UTIs Acute metabolic encephalopathy due to UTI History of dementia: CT head no acute finding and TSH normal. Initially hypotensive, resolved with IV fluid resuscitation. Mentation overall improved.  Urine cultures>100k strep, 80k E faecalis (R:vanc)> s/p Ampicillin  and fosfomycin antibiotic completed 8/8. Blood culture NGTD Has been more alert awake, oral intake picking up.her Cymbalta  was doubled 4 days ago but no signs of serotonin syndrome> duloxetine  on hold for now. Follow-up with PCP   Paroxysmal A-fib: Continue Eliquis  at the appropriate dose per pharmacy recommended 5 mg twice daily.  Intermittent soft blood pressure Nausea vomiting x 1: Patient started to vomit after getting up to work with PT 8/8 discharge was held.  As per son intermittently she does get nausea on Mobility.Add Zofran  on discharge.    IDDM type 2: PTA on Januvia  and metformin  Lantus  18 units at bedtime>poorly controlled CBG here and adjusting basal bolus regimen.  Not well-controlled and overnight-160s > appears more tight will decrease insulin  regimen slightly > will cont on Semglee  along with Premeal insulin  that is to be taken only if she is eating more than 50% of the meal discussed and informed patient's son.   Her blood sugar needs to monitor 4 times a day at rehab. Recent Labs  Lab 09/21/23 0228 09/21/23 0609 09/25/23 0549 09/25/23 1121 09/25/23 1639 09/25/23 2106 09/26/23 0616  GLUCAP  --    < > 329* 213* 108* 115* 167*  HGBA1C 8.0*  --   --   --   --   --   --    < > =  values in this interval not displayed.     Chronic normocytic anemia Hemoglobin at baseline.   Hypothyroidism: Euthyroid.  Resume Synthroid   Mobility: PT Orders: Active PT Follow up Rec: Skilled Nursing-Short Term Rehab (<3 Hours/Day)09/25/2023 1405   DVT prophylaxis: Eliquis  Code Status:   Code Status: Full Code Family Communication: plan of care  discussed with patient/son at bedside. Patient status is: Remains hospitalized because of severity of illness Level of care: Progressive   Dispo: The patient is from: From nursing home            Anticipated disposition: Anticipating discharge to SNF today  Objective: Vitals last 24 hrs: Vitals:   09/26/23 0010 09/26/23 0358 09/26/23 0500 09/26/23 0713  BP: 106/62 (!) 115/56  (!) 109/57  Pulse: 81 96 91   Resp: 14 12 14 18   Temp: 98.4 F (36.9 C) 100.1 F (37.8 C)  98.4 F (36.9 C)  TempSrc: Oral Oral  Oral  SpO2: 97% 94% 92% 94%  Weight:      Height:        Physical Examination: General exam: AAOX2 HEENT:Oral mucosa moist, Ear/Nose WNL grossly Respiratory system: B/L clear BS no use of accessory muscle Cardiovascular system: S1 & S2 +, No JVD. Gastrointestinal system: Abdomen soft,NT,ND, BS+ Nervous System: Alert, awake, moving extremities  Extremities: LE edema neg, distal extremities warm.  Skin: No rashes,no icterus. MSK: Normal muscle bulk,tone, power   Medications reviewed:  Scheduled Meds:  apixaban   5 mg Oral BID   cholecalciferol   5,000 Units Oral Weekly   cyanocobalamin   2,000 mcg Oral Daily   insulin  aspart  0-5 Units Subcutaneous QHS   insulin  aspart  0-9 Units Subcutaneous TID WC   insulin  aspart  12 Units Subcutaneous TID WC   insulin  glargine-yfgn  36 Units Subcutaneous Daily   levothyroxine   150 mcg Oral Daily   linagliptin   5 mg Oral Daily   metFORMIN   1,000 mg Oral BID WC   omega-3 acid ethyl esters  1 g Oral Daily   Continuous Infusions:   Diet: Diet Order             Diet Carb Modified Fluid consistency: Thin; Room service appropriate? Yes  Diet effective now                      Consultation: See note.  Discharge Instructions  Discharge Instructions     Diet Carb Modified   Complete by: As directed    Discharge instructions   Complete by: As directed    Please call call MD or return to ER for similar or worsening  recurring problem that brought you to hospital or if any fever,nausea/vomiting,abdominal pain, uncontrolled pain, chest pain,  shortness of breath or any other alarming symptoms.  Please follow-up your doctor as instructed in a week time and call the office for appointment.  Please avoid alcohol, smoking, or any other illicit substance and maintain healthy habits including taking your regular medications as prescribed.  You were cared for by a hospitalist during your hospital stay. If you have any questions about your discharge medications or the care you received while you were in the hospital after you are discharged, you can call the unit and ask to speak with the hospitalist on call if the hospitalist that took care of you is not available.  Once you are discharged, your primary care physician will handle any further medical issues. Please note that NO REFILLS for any discharge medications will  be authorized once you are discharged, as it is imperative that you return to your primary care physician (or establish a relationship with a primary care physician if you do not have one) for your aftercare needs so that they can reassess your need for medications and monitor your lab values   Check blood sugar 3 times a day and bedtime at home. If blood sugar running above 200 less than 70 please call your MD to adjust insulin . If blood sugars running less 100 do not use insulin  and call MD. If you noticed signs and symptoms of hypoglycemia or low blood sugar like jitteriness, confusion, thirst, tremor, sweating- Check blood sugar, drink sugary drink/biscuits/sweets to increase sugar level and call MD or return to ER.   Increase activity slowly   Complete by: As directed       Allergies as of 09/26/2023       Reactions   Naproxen Sodium Other (See Comments)   Fever/aches and pains   Statins Other (See Comments)   myalgias   Sulfonamide Derivatives Hives, Itching   Not documented on MAR   Latex  Itching, Rash   Shellfish Allergy Itching, Swelling, Rash   Seafood, shrimp        Medication List     STOP taking these medications    hydrOXYzine  25 MG tablet Commonly known as: ATARAX        TAKE these medications    apixaban  5 MG Tabs tablet Commonly known as: ELIQUIS  Take 1 tablet (5 mg total) by mouth 2 (two) times daily. What changed:  medication strength how much to take   cyanocobalamin  1000 MCG tablet Take 2,000 mcg by mouth daily.   docusate sodium  100 MG capsule Commonly known as: COLACE Take 100 mg by mouth 2 (two) times daily.   DULoxetine  30 MG capsule Commonly known as: CYMBALTA  Take 30 mg by mouth daily.   Fish Oil 1000 MG Caps Take 1,000 mg by mouth daily.   insulin  aspart 100 UNIT/ML FlexPen Commonly known as: NOVOLOG  Inject 10 Units into the skin 3 (three) times daily with meals. Administer only if eating more than 50% of the meal.   Januvia  100 MG tablet Generic drug: sitaGLIPtin  TAKE 1 TABLET BY MOUTH EVERY DAY   Lantus  SoloStar 100 UNIT/ML Solostar Pen Generic drug: insulin  glargine Inject 30 Units into the skin at bedtime. What changed: how much to take   levothyroxine  150 MCG tablet Commonly known as: SYNTHROID  Take 150 mcg by mouth daily.   magnesium  oxide 400 (240 Mg) MG tablet Commonly known as: MAG-OX Take 800 mg by mouth 2 (two) times daily.   metFORMIN  1000 MG tablet Commonly known as: GLUCOPHAGE  Take 1,000 mg by mouth 2 (two) times daily.   ondansetron  4 MG tablet Commonly known as: Zofran  Take 1 tablet (4 mg total) by mouth every 8 (eight) hours as needed for nausea or vomiting.   Vitamin D3 1.25 MG (50000 UT) Caps Take 1 capsule by mouth once a week.        Allergies  Allergen Reactions   Naproxen Sodium Other (See Comments)    Fever/aches and pains   Statins Other (See Comments)    myalgias   Sulfonamide Derivatives Hives and Itching    Not documented on MAR   Latex Itching and Rash   Shellfish  Allergy Itching, Swelling and Rash    Seafood, shrimp    The results of significant diagnostics from this hospitalization (including imaging, microbiology, ancillary and laboratory) are  listed below for reference.    Microbiology: Recent Results (from the past 240 hours)  Resp panel by RT-PCR (RSV, Flu A&B, Covid) Anterior Nasal Swab     Status: None   Collection Time: 09/20/23  8:43 PM   Specimen: Anterior Nasal Swab  Result Value Ref Range Status   SARS Coronavirus 2 by RT PCR NEGATIVE NEGATIVE Final   Influenza A by PCR NEGATIVE NEGATIVE Final   Influenza B by PCR NEGATIVE NEGATIVE Final    Comment: (NOTE) The Xpert Xpress SARS-CoV-2/FLU/RSV plus assay is intended as an aid in the diagnosis of influenza from Nasopharyngeal swab specimens and should not be used as a sole basis for treatment. Nasal washings and aspirates are unacceptable for Xpert Xpress SARS-CoV-2/FLU/RSV testing.  Fact Sheet for Patients: BloggerCourse.com  Fact Sheet for Healthcare Providers: SeriousBroker.it  This test is not yet approved or cleared by the [place]  FDA and has been authorized for detection and/or diagnosis of SARS-CoV-2 by FDA under an Emergency Use Authorization (EUA). This EUA will remain in effect (meaning this test can be used) for the duration of the COVID-19 declaration under Section 564(b)(1) of the Act, 21 U.S.C. section 360bbb-3(b)(1), unless the authorization is terminated or revoked.     Resp Syncytial Virus by PCR NEGATIVE NEGATIVE Final    Comment: (NOTE) Fact Sheet for Patients: BloggerCourse.com  Fact Sheet for Healthcare Providers: SeriousBroker.it  This test is not yet approved or cleared by the [place]  FDA and has been authorized for detection and/or diagnosis of SARS-CoV-2 by FDA under an Emergency Use Authorization (EUA). This EUA will remain in effect  (meaning this test can be used) for the duration of the COVID-19 declaration under Section 564(b)(1) of the Act, 21 U.S.C. section 360bbb-3(b)(1), unless the authorization is terminated or revoked.  Performed at [facility], [address] [place], [place] [address]   Blood Culture (routine x 2)     Status: None   Collection Time: 09/20/23  9:00 PM   Specimen: BLOOD LEFT FOREARM  Result Value Ref Range Status   Specimen Description BLOOD LEFT FOREARM  Final   Special Requests   Final    BOTTLES DRAWN AEROBIC AND ANAEROBIC Blood Culture adequate volume   Culture   Final    NO GROWTH 5 DAYS Performed at [facility], [address] [place], [place] [address]    Report Status 09/25/2023 FINAL  Final  Urine Culture     Status: Abnormal   Collection Time: 09/20/23  9:03 PM   Specimen: Urine, Clean Catch  Result Value Ref Range Status   Specimen Description URINE, CLEAN CATCH  Final   Special Requests   Final    NONE Performed at [facility], [address], [place], [place] [address]    Culture (A)  Final    >=100,000 COLONIES/mL STREPTOCOCCI, ALPHA HEMOLYTIC Standardized susceptibility testing for this organism is not available. 80,000 COLONIES/mL ENTEROCOCCUS FAECALIS VANCOMYCIN  RESISTANT ENTEROCOCCUS    Report Status 09/23/2023 FINAL  Final   Organism ID, Bacteria ENTEROCOCCUS FAECALIS (A)  Final      Susceptibility   Enterococcus faecalis - MIC*    AMPICILLIN  <=2 SENSITIVE Sensitive     NITROFURANTOIN  <=16 SENSITIVE Sensitive     VANCOMYCIN  >=32 RESISTANT Resistant     * 80,000 COLONIES/mL ENTEROCOCCUS FAECALIS  Blood Culture (routine x 2)     Status: None   Collection Time: 09/20/23  9:10 PM   Specimen: BLOOD RIGHT FOREARM  Result Value Ref Range Status   Specimen Description BLOOD RIGHT FOREARM  Final   Special Requests   Final    BOTTLES DRAWN AEROBIC AND ANAEROBIC Blood Culture adequate volume   Culture   Final    NO GROWTH 5 DAYS Performed  at [facility], [address] [place], [place] [address]    Report Status 09/25/2023 FINAL  Final  MRSA Next Gen by PCR, Nasal     Status: Abnormal   Collection Time: 09/21/23  1:30 AM   Specimen: Nasal Mucosa; Nasal Swab  Result Value Ref Range Status   MRSA by PCR Next Gen DETECTED (A) NOT DETECTED Final    Comment: RESULT CALLED TO, READ BACK BY AND VERIFIED WITH: RN CARMELITA CROME [id number] FCP (NOTE) The GeneXpert MRSA Assay (FDA approved for NASAL specimens only), is one component of a comprehensive MRSA colonization surveillance program. It is not intended to diagnose MRSA infection nor to guide or monitor treatment for MRSA infections. Test performance is not FDA approved in patients less than 21 years old. Performed at [facility], [address], [place], [place] [address]     Procedures/Studies: CT HEAD WO CONTRAST ( ) Result Date: 09/21/2023 EXAM: CT HEAD WITHOUT 09/21/2023 01:09:00 AM TECHNIQUE: CT of the head was performed without the administration of intravenous contrast. Automated exposure control, iterative reconstruction, and/or weight based adjustment of the mA/kV was utilized to reduce the radiation dose to as low as reasonably achievable. COMPARISON: 11/22/2022 CLINICAL HISTORY: Delirium. Chief complaints; Altered Mental Status. FINDINGS: BRAIN AND VENTRICLES: No acute intracranial hemorrhage. No mass effect or midline shift. No extra-axial fluid collection. Gray-white differentiation is maintained. No hydrocephalus. Chronic microvascular ischemia and generalized atrophy. ORBITS: No acute abnormality. SINUSES AND MASTOIDS: No acute abnormality. SOFT TISSUES AND SKULL: No acute skull fracture. No acute soft tissue abnormality. IMPRESSION: 1. No acute intracranial abnormality. Electronically signed by: Norman Gatlin MD 09/21/2023 01:32 AM EDT RP Workstation: [id number]   DG Chest Port 1 View Result Date: 09/20/2023 EXAM: 1 VIEW XRAY OF THE CHEST 09/20/2023  08:33:00 PM COMPARISON: 11/22/2022 CLINICAL HISTORY: Questionable sepsis - evaluate for abnormality. FINDINGS: LUNGS AND PLEURA: No focal pulmonary opacity. No pulmonary edema. No pleural effusion. No pneumothorax. HEART AND MEDIASTINUM: Stable cardiomediastinal silhouette. Aortic atherosclerotic calcification. BONES AND SOFT TISSUES: No acute osseous abnormality. IMPRESSION: 1. No acute findings. Electronically signed by: Norman Gatlin MD 09/20/2023 08:50 PM EDT RP Workstation: [id number]    Labs: BNP (last 3 results) No results for input(s): BNP in the last 8760 hours. Basic Metabolic Panel: Recent Labs  Lab 09/22/23 0320 09/23/23 0233 09/24/23 0313 09/25/23 0252 09/26/23 0217  NA 139 135 138 143 138  K 3.7 3.9 3.6 3.8 4.1  CL 108 103 104 106 102  CO2 24 23 24 25 26   GLUCOSE 225* 383* 314* 249* 105*  BUN 8 8 7* 11 10  CREATININE 0.68 0.77 0.79 0.83 0.83  CALCIUM  8.8* 8.9 8.7* 9.2 9.3   Liver Function Tests: Recent Labs  Lab 09/20/23 2001 09/21/23 0228 09/22/23 0320  AST 34 23 19  ALT 16 18 16   ALKPHOS 71 71 65  BILITOT 1.5* 0.8 0.7  PROT 6.2* 6.2* 6.3*  ALBUMIN 2.8* 2.8* 2.7*   No results for input(s): LIPASE, AMYLASE in the last 168 hours. Recent Labs  Lab 09/22/23 0320  AMMONIA 21   CBC: Recent Labs  Lab 09/20/23 2001 09/21/23 9771 09/22/23 0320 09/23/23 9766 09/24/23 9686 09/25/23 0252 09/26/23 0217  WBC 8.0   < > 7.2 6.3 7.1 7.9 11.1*  NEUTROABS 5.1  --   --   --   --   --   --   HGB 10.5*   < > 9.9* 9.6* 9.9* 10.2* 10.7*  HCT 32.6*   < > 30.4* 28.9* 29.7* 30.7* 32.8*  MCV 94.2   < > 91.6 90.6 91.4 90.6 91.6  PLT 267   < > 250 252 237 251 300   < > = values in this interval not displayed.   CBG: Recent Labs  Lab 09/25/23 0549 09/25/23 1121 09/25/23 1639 09/25/23 2106 09/26/23 0616  GLUCAP 329* 213* 108* 115* 167*   Hgb A1c No results for input(s): HGBA1C in the last 72 hours. Anemia work up No results for input(s): VITAMINB12,  FOLATE, FERRITIN, TIBC, IRON, RETICCTPCT in the last 72 hours. Cardiac Enzymes: No results for input(s): CKTOTAL, CKMB, CKMBINDEX, TROPONINI in the last 168 hours. BNP: Invalid input(s): POCBNP D-Dimer No results for input(s): DDIMER in the last 72 hours. Lipid Profile No results for input(s): CHOL, HDL, LDLCALC, TRIG, CHOLHDL, LDLDIRECT in the last 72 hours. Thyroid  function studies No results for input(s): TSH, T4TOTAL, T3FREE, THYROIDAB in the last 72 hours.  Invalid input(s): FREET3 Urinalysis    Component Value Date/Time   COLORURINE YELLOW 09/20/2023 2103   APPEARANCEUR CLOUDY (A) 09/20/2023 2103   LABSPEC 1.006 09/20/2023 2103   PHURINE 6.0 09/20/2023 2103   GLUCOSEU NEGATIVE 09/20/2023 2103   GLUCOSEU >=1000 (A) 03/16/2019 0915   HGBUR SMALL (A) 09/20/2023 2103   BILIRUBINUR NEGATIVE 09/20/2023 2103   BILIRUBINUR Negative 12/25/2020 1243   KETONESUR NEGATIVE 09/20/2023 2103   PROTEINUR NEGATIVE 09/20/2023 2103   UROBILINOGEN 0.2 12/25/2020 1243   UROBILINOGEN 0.2 03/16/2019 0915   NITRITE NEGATIVE 09/20/2023 2103   LEUKOCYTESUR LARGE (A) 09/20/2023 2103   Sepsis Labs Recent Labs  Lab 09/23/23 0233 09/24/23 0313 09/25/23 0252 09/26/23 0217  WBC 6.3 7.1 7.9 11.1*   Microbiology Recent Results (from the past 240 hours)  Resp panel by RT-PCR (RSV, Flu A&B, Covid) Anterior Nasal Swab     Status: None   Collection Time: 09/20/23  8:43 PM   Specimen: Anterior Nasal Swab  Result Value Ref Range Status   SARS Coronavirus 2 by RT PCR NEGATIVE NEGATIVE Final   Influenza A by PCR NEGATIVE NEGATIVE Final   Influenza B by PCR NEGATIVE NEGATIVE Final    Comment: (NOTE) The Xpert Xpress SARS-CoV-2/FLU/RSV plus assay is intended as an aid in the diagnosis of influenza from Nasopharyngeal swab specimens and should not be used as a sole basis for treatment. Nasal washings and aspirates are unacceptable for Xpert Xpress  SARS-CoV-2/FLU/RSV testing.  Fact Sheet for Patients: BloggerCourse.com  Fact Sheet for Healthcare Providers: SeriousBroker.it  This test is not yet approved or cleared by the [place]  FDA and has been authorized for detection and/or diagnosis of SARS-CoV-2 by FDA under an Emergency Use Authorization (EUA). This EUA will remain in effect (meaning this test can be used) for the duration of the COVID-19 declaration under Section 564(b)(1) of the Act, 21 U.S.C. section 360bbb-3(b)(1), unless the authorization is terminated or revoked.     Resp Syncytial Virus by PCR NEGATIVE NEGATIVE Final    Comment: (NOTE) Fact Sheet for Patients: BloggerCourse.com  Fact Sheet for Healthcare Providers: SeriousBroker.it  This test is not yet approved or cleared by the [place]  FDA and has been authorized for detection and/or diagnosis of SARS-CoV-2 by FDA under  an Emergency Use Authorization (EUA). This EUA will remain in effect (meaning this test can be used) for the duration of the COVID-19 declaration under Section 564(b)(1) of the Act, 21 U.S.C. section 360bbb-3(b)(1), unless the authorization is terminated or revoked.  Performed at [facility], [address] [place], [place] [address]   Blood Culture (routine x 2)     Status: None   Collection Time: 09/20/23  9:00 PM   Specimen: BLOOD LEFT FOREARM  Result Value Ref Range Status   Specimen Description BLOOD LEFT FOREARM  Final   Special Requests   Final    BOTTLES DRAWN AEROBIC AND ANAEROBIC Blood Culture adequate volume   Culture   Final    NO GROWTH 5 DAYS Performed at [facility], [address], [place], [place] [address]    Report Status 09/25/2023 FINAL  Final  Urine Culture     Status: Abnormal   Collection Time: 09/20/23  9:03 PM   Specimen: Urine, Clean Catch  Result Value Ref Range Status    Specimen Description URINE, CLEAN CATCH  Final   Special Requests   Final    NONE Performed at [facility], [address] [place], [place] [address]    Culture (A)  Final    >=100,000 COLONIES/mL STREPTOCOCCI, ALPHA HEMOLYTIC Standardized susceptibility testing for this organism is not available. 80,000 COLONIES/mL ENTEROCOCCUS FAECALIS VANCOMYCIN  RESISTANT ENTEROCOCCUS    Report Status 09/23/2023 FINAL  Final   Organism ID, Bacteria ENTEROCOCCUS FAECALIS (A)  Final      Susceptibility   Enterococcus faecalis - MIC*    AMPICILLIN  <=2 SENSITIVE Sensitive     NITROFURANTOIN  <=16 SENSITIVE Sensitive     VANCOMYCIN  >=32 RESISTANT Resistant     * 80,000 COLONIES/mL ENTEROCOCCUS FAECALIS  Blood Culture (routine x 2)     Status: None   Collection Time: 09/20/23  9:10 PM   Specimen: BLOOD RIGHT FOREARM  Result Value Ref Range Status   Specimen Description BLOOD RIGHT FOREARM  Final   Special Requests   Final    BOTTLES DRAWN AEROBIC AND ANAEROBIC Blood Culture adequate volume   Culture   Final    NO GROWTH 5 DAYS Performed at [facility], [address] [place], [place] [address]    Report Status 09/25/2023 FINAL  Final  MRSA Next Gen by PCR, Nasal     Status: Abnormal   Collection Time: 09/21/23  1:30 AM   Specimen: Nasal Mucosa; Nasal Swab  Result Value Ref Range Status   MRSA by PCR Next Gen DETECTED (A) NOT DETECTED Final    Comment: RESULT CALLED TO, READ BACK BY AND VERIFIED WITH: RN CARMELITA CROME [id number] FCP (NOTE) The GeneXpert MRSA Assay (FDA approved for NASAL specimens only), is one component of a comprehensive MRSA colonization surveillance program. It is not intended to diagnose MRSA infection nor to guide or monitor treatment for MRSA infections. Test performance is not FDA approved in patients less than 75 years old. Performed at [facility], [address] [place], [place] [address]    Time coordinating discharge: 35  minutes  SIGNED: Mennie LAMY, MD  [facility] 09/26/2023, 10:21 AM  If 7PM-7AM, please contact night-coverage [email or web address]

## 2023-09-26 NOTE — Progress Notes (Signed)
 Removed Ivs, gave PT[facility] the AVS forms, belongings gathered, and called to give report/left number and name with Diplomatic Services operational officer. Patient was wheeled out by [employer]

## 2023-09-28 DIAGNOSIS — E039 Hypothyroidism, unspecified: Secondary | ICD-10-CM | POA: Diagnosis not present

## 2023-09-28 DIAGNOSIS — A419 Sepsis, unspecified organism: Secondary | ICD-10-CM | POA: Diagnosis not present

## 2023-09-28 DIAGNOSIS — Z794 Long term (current) use of insulin: Secondary | ICD-10-CM | POA: Diagnosis not present

## 2023-09-28 DIAGNOSIS — I480 Paroxysmal atrial fibrillation: Secondary | ICD-10-CM | POA: Diagnosis not present

## 2023-09-28 DIAGNOSIS — M6281 Muscle weakness (generalized): Secondary | ICD-10-CM | POA: Diagnosis not present

## 2023-09-28 DIAGNOSIS — R2689 Other abnormalities of gait and mobility: Secondary | ICD-10-CM | POA: Diagnosis not present

## 2023-09-28 DIAGNOSIS — B961 Klebsiella pneumoniae [K. pneumoniae] as the cause of diseases classified elsewhere: Secondary | ICD-10-CM | POA: Diagnosis not present

## 2023-09-28 DIAGNOSIS — E119 Type 2 diabetes mellitus without complications: Secondary | ICD-10-CM | POA: Diagnosis not present

## 2023-09-29 DIAGNOSIS — E119 Type 2 diabetes mellitus without complications: Secondary | ICD-10-CM | POA: Diagnosis not present

## 2023-09-29 DIAGNOSIS — N390 Urinary tract infection, site not specified: Secondary | ICD-10-CM | POA: Diagnosis not present

## 2023-09-29 DIAGNOSIS — I480 Paroxysmal atrial fibrillation: Secondary | ICD-10-CM | POA: Diagnosis not present

## 2023-09-29 DIAGNOSIS — Z794 Long term (current) use of insulin: Secondary | ICD-10-CM | POA: Diagnosis not present

## 2023-09-30 DIAGNOSIS — E119 Type 2 diabetes mellitus without complications: Secondary | ICD-10-CM | POA: Diagnosis not present

## 2023-09-30 DIAGNOSIS — N390 Urinary tract infection, site not specified: Secondary | ICD-10-CM | POA: Diagnosis not present

## 2023-09-30 DIAGNOSIS — Z794 Long term (current) use of insulin: Secondary | ICD-10-CM | POA: Diagnosis not present

## 2023-09-30 DIAGNOSIS — I480 Paroxysmal atrial fibrillation: Secondary | ICD-10-CM | POA: Diagnosis not present

## 2023-10-01 DIAGNOSIS — N390 Urinary tract infection, site not specified: Secondary | ICD-10-CM | POA: Diagnosis not present

## 2023-10-01 DIAGNOSIS — M6281 Muscle weakness (generalized): Secondary | ICD-10-CM | POA: Diagnosis not present

## 2023-10-01 DIAGNOSIS — I10 Essential (primary) hypertension: Secondary | ICD-10-CM | POA: Diagnosis not present

## 2023-10-01 DIAGNOSIS — R2689 Other abnormalities of gait and mobility: Secondary | ICD-10-CM | POA: Diagnosis not present

## 2023-10-01 DIAGNOSIS — B961 Klebsiella pneumoniae [K. pneumoniae] as the cause of diseases classified elsewhere: Secondary | ICD-10-CM | POA: Diagnosis not present

## 2023-10-01 DIAGNOSIS — A419 Sepsis, unspecified organism: Secondary | ICD-10-CM | POA: Diagnosis not present

## 2023-10-01 DIAGNOSIS — I480 Paroxysmal atrial fibrillation: Secondary | ICD-10-CM | POA: Diagnosis not present

## 2023-10-01 DIAGNOSIS — E119 Type 2 diabetes mellitus without complications: Secondary | ICD-10-CM | POA: Diagnosis not present

## 2023-10-02 DIAGNOSIS — E119 Type 2 diabetes mellitus without complications: Secondary | ICD-10-CM | POA: Diagnosis not present

## 2023-10-02 DIAGNOSIS — Z794 Long term (current) use of insulin: Secondary | ICD-10-CM | POA: Diagnosis not present

## 2023-10-02 DIAGNOSIS — N390 Urinary tract infection, site not specified: Secondary | ICD-10-CM | POA: Diagnosis not present

## 2023-10-02 DIAGNOSIS — I480 Paroxysmal atrial fibrillation: Secondary | ICD-10-CM | POA: Diagnosis not present

## 2023-10-05 DIAGNOSIS — I480 Paroxysmal atrial fibrillation: Secondary | ICD-10-CM | POA: Diagnosis not present

## 2023-10-05 DIAGNOSIS — R2689 Other abnormalities of gait and mobility: Secondary | ICD-10-CM | POA: Diagnosis not present

## 2023-10-05 DIAGNOSIS — N390 Urinary tract infection, site not specified: Secondary | ICD-10-CM | POA: Diagnosis not present

## 2023-10-05 DIAGNOSIS — Z794 Long term (current) use of insulin: Secondary | ICD-10-CM | POA: Diagnosis not present

## 2023-10-05 DIAGNOSIS — E119 Type 2 diabetes mellitus without complications: Secondary | ICD-10-CM | POA: Diagnosis not present

## 2023-10-05 DIAGNOSIS — A419 Sepsis, unspecified organism: Secondary | ICD-10-CM | POA: Diagnosis not present

## 2023-10-05 DIAGNOSIS — B961 Klebsiella pneumoniae [K. pneumoniae] as the cause of diseases classified elsewhere: Secondary | ICD-10-CM | POA: Diagnosis not present

## 2023-10-05 DIAGNOSIS — M6281 Muscle weakness (generalized): Secondary | ICD-10-CM | POA: Diagnosis not present

## 2023-10-06 ENCOUNTER — Other Ambulatory Visit: Payer: Self-pay

## 2023-10-06 ENCOUNTER — Emergency Department (HOSPITAL_COMMUNITY): Admission: EM | Admit: 2023-10-06 | Discharge: 2023-10-07 | Disposition: A | Discharge status: Home / Self Care

## 2023-10-06 ENCOUNTER — Emergency Department (HOSPITAL_COMMUNITY)

## 2023-10-06 ENCOUNTER — Encounter (HOSPITAL_COMMUNITY): Payer: Self-pay

## 2023-10-06 DIAGNOSIS — I700 Atherosclerosis of aorta: Secondary | ICD-10-CM | POA: Diagnosis not present

## 2023-10-06 DIAGNOSIS — R410 Disorientation, unspecified: Secondary | ICD-10-CM | POA: Insufficient documentation

## 2023-10-06 DIAGNOSIS — R404 Transient alteration of awareness: Secondary | ICD-10-CM | POA: Insufficient documentation

## 2023-10-06 DIAGNOSIS — Z91040 Latex allergy status: Secondary | ICD-10-CM | POA: Diagnosis not present

## 2023-10-06 DIAGNOSIS — R627 Adult failure to thrive: Secondary | ICD-10-CM | POA: Diagnosis not present

## 2023-10-06 DIAGNOSIS — F0390 Unspecified dementia without behavioral disturbance: Secondary | ICD-10-CM | POA: Insufficient documentation

## 2023-10-06 DIAGNOSIS — I480 Paroxysmal atrial fibrillation: Secondary | ICD-10-CM | POA: Diagnosis not present

## 2023-10-06 DIAGNOSIS — I10 Essential (primary) hypertension: Secondary | ICD-10-CM | POA: Diagnosis not present

## 2023-10-06 DIAGNOSIS — R0602 Shortness of breath: Secondary | ICD-10-CM | POA: Diagnosis not present

## 2023-10-06 DIAGNOSIS — Z8659 Personal history of other mental and behavioral disorders: Secondary | ICD-10-CM

## 2023-10-06 DIAGNOSIS — N390 Urinary tract infection, site not specified: Secondary | ICD-10-CM | POA: Diagnosis not present

## 2023-10-06 DIAGNOSIS — I672 Cerebral atherosclerosis: Secondary | ICD-10-CM | POA: Diagnosis not present

## 2023-10-06 DIAGNOSIS — R4182 Altered mental status, unspecified: Secondary | ICD-10-CM | POA: Diagnosis present

## 2023-10-06 DIAGNOSIS — Z7901 Long term (current) use of anticoagulants: Secondary | ICD-10-CM | POA: Insufficient documentation

## 2023-10-06 DIAGNOSIS — E119 Type 2 diabetes mellitus without complications: Secondary | ICD-10-CM | POA: Diagnosis not present

## 2023-10-06 LAB — TROPONIN I (HIGH SENSITIVITY): Troponin I (High Sensitivity): 5 ng/L (ref <18)

## 2023-10-06 LAB — URINALYSIS, ROUTINE W REFLEX MICROSCOPIC
Bilirubin Urine: NEGATIVE
Glucose, UA: 50 mg/dL — AB
Hgb urine dipstick: NEGATIVE
Ketones, ur: NEGATIVE mg/dL
Leukocytes,Ua: NEGATIVE
Nitrite: NEGATIVE
Protein, ur: NEGATIVE mg/dL
Specific Gravity, Urine: 1.01 (ref 1.005–1.030)
pH: 6 (ref 5.0–8.0)

## 2023-10-06 LAB — CBC WITH DIFFERENTIAL/PLATELET
Abs Immature Granulocytes: 0.04 K/uL (ref 0.00–0.07)
Basophils Absolute: 0 K/uL (ref 0.0–0.1)
Basophils Relative: 1 %
Eosinophils Absolute: 0.3 K/uL (ref 0.0–0.5)
Eosinophils Relative: 3 %
HCT: 36.5 % (ref 36.0–46.0)
Hemoglobin: 11.8 g/dL — ABNORMAL LOW (ref 12.0–15.0)
Immature Granulocytes: 1 %
Lymphocytes Relative: 23 %
Lymphs Abs: 1.9 K/uL (ref 0.7–4.0)
MCH: 29.8 pg (ref 26.0–34.0)
MCHC: 32.3 g/dL (ref 30.0–36.0)
MCV: 92.2 fL (ref 80.0–100.0)
Monocytes Absolute: 0.6 K/uL (ref 0.1–1.0)
Monocytes Relative: 7 %
Neutro Abs: 5.5 K/uL (ref 1.7–7.7)
Neutrophils Relative %: 65 %
Platelets: 360 K/uL (ref 150–400)
RBC: 3.96 MIL/uL (ref 3.87–5.11)
RDW: 14.6 % (ref 11.5–15.5)
WBC: 8.2 K/uL (ref 4.0–10.5)
nRBC: 0 % (ref 0.0–0.2)

## 2023-10-06 LAB — BASIC METABOLIC PANEL WITH GFR
Anion gap: 12 (ref 5–15)
BUN: 12 mg/dL (ref 8–23)
CO2: 27 mmol/L (ref 22–32)
Calcium: 9.6 mg/dL (ref 8.9–10.3)
Chloride: 99 mmol/L (ref 98–111)
Creatinine, Ser: 0.75 mg/dL (ref 0.44–1.00)
GFR, Estimated: >60 mL/min (ref >60)
Glucose, Bld: 254 mg/dL — ABNORMAL HIGH (ref 70–99)
Potassium: 4.5 mmol/L (ref 3.5–5.1)
Sodium: 138 mmol/L (ref 135–145)

## 2023-10-06 MED ORDER — QUETIAPINE FUMARATE 50 MG PO TABS: 50.0000 mg | ORAL_TABLET | Freq: Every day | ORAL | 0 refills | Status: AC

## 2023-10-06 MED ORDER — QUETIAPINE FUMARATE 25 MG PO TABS
50.0000 mg | ORAL_TABLET | Freq: Once | ORAL | Status: AC
  Administered 2023-10-06: 50 mg via ORAL
  Filled 2023-10-06: qty 2

## 2023-10-06 NOTE — ED Notes (Signed)
 PT[facility] called for transport.

## 2023-10-06 NOTE — ED Provider Notes (Signed)
 [place] EMERGENCY DEPARTMENT AT [facility] Provider Note   CSN: [id number] Arrival date & time: 10/06/23  1849     Patient presents with: Altered Mental Status   Jaclyn [name] is a 88 y.o. female.   88 year old female brought in by EMS from nursing home for evaluation of altered mental status.  She was reportedly yelling at staff this evening and seemed more agitated than usual.  Was recently treated here and admitted for UTI.  Patient is a poor historian, appears well and has no complaints at this time but does appear somewhat confused, gazing around the room   Altered Mental Status      Prior to Admission medications   Medication Sig Start Date End Date Taking? Authorizing Provider  QUEtiapine  (SEROQUEL ) 50 MG tablet Take 1 tablet (50 mg total) by mouth at bedtime for 14 days. 10/06/23 10/20/23 Yes Walther Sanagustin L, DO  apixaban  (ELIQUIS ) 5 MG TABS tablet Take 1 tablet (5 mg total) by mouth 2 (two) times daily. 09/26/23   Christobal Guadalajara, MD  Cholecalciferol  (VITAMIN D3) 1.25 MG (50000 UT) CAPS Take 1 capsule by mouth once a week. 12/27/20   Bedsole, Amy E, MD  cyanocobalamin  1000 MCG tablet Take 2,000 mcg by mouth daily.    [provider]  docusate sodium  (COLACE) 100 MG capsule Take 100 mg by mouth 2 (two) times daily.    [provider]  DULoxetine  (CYMBALTA ) 30 MG capsule Take 30 mg by mouth daily. 01/13/22   [provider]  insulin  aspart (NOVOLOG ) 100 UNIT/ML FlexPen Inject 10 Units into the skin 3 (three) times daily with meals. Administer only if eating more than 50% of the meal. 09/26/23   Kc, Guadalajara, MD  JANUVIA  100 MG tablet TAKE 1 TABLET BY MOUTH EVERY DAY 01/07/21   Bedsole, Amy E, MD  LANTUS  SOLOSTAR 100 UNIT/ML Solostar Pen Inject 30 Units into the skin at bedtime. 09/26/23   Christobal Guadalajara, MD  levothyroxine  (SYNTHROID ) 150 MCG tablet Take 150 mcg by mouth daily. 11/14/22   [provider]  magnesium  oxide (MAG-OX) 400 (240 Mg)  MG tablet Take 800 mg by mouth 2 (two) times daily.    [provider]  metFORMIN  (GLUCOPHAGE ) 1000 MG tablet Take 1,000 mg by mouth 2 (two) times daily. 10/28/21   [provider]  Omega-3 Fatty Acids (FISH OIL) 1000 MG CAPS Take 1,000 mg by mouth daily.    [provider]  ondansetron  (ZOFRAN ) 4 MG tablet Take 1 tablet (4 mg total) by mouth every 8 (eight) hours as needed for nausea or vomiting. 09/26/23   Christobal Guadalajara, MD    Allergies: Naproxen sodium, Statins, Sulfonamide derivatives, Latex, and Shellfish allergy    Review of Systems  Unable to perform ROS: Dementia    Updated Vital Signs BP (!) 150/65   Pulse 76   Temp 97.9 F (36.6 C) (Oral)   Resp 17   SpO2 99%   Physical Exam Vitals and nursing note reviewed.  Constitutional:      General: She is not in acute distress.    Appearance: Normal appearance. She is well-developed. She is not ill-appearing.  HENT:     Head: Normocephalic and atraumatic.  Eyes:     Conjunctiva/sclera: Conjunctivae normal.  Cardiovascular:     Rate and Rhythm: Normal rate and regular rhythm.     Heart sounds: No murmur heard. Pulmonary:     Effort: Pulmonary effort is normal. No respiratory distress.  Breath sounds: Normal breath sounds.  Abdominal:     Palpations: Abdomen is soft.     Tenderness: There is no abdominal tenderness.  Musculoskeletal:        General: No swelling.     Cervical back: Neck supple.  Skin:    General: Skin is warm and dry.     Capillary Refill: Capillary refill takes less than 2 seconds.  Neurological:     Mental Status: She is alert. Mental status is at baseline.  Psychiatric:        Mood and Affect: Mood normal.     (all labs ordered are listed, but only abnormal results are displayed) Labs Reviewed  BASIC METABOLIC PANEL WITH GFR - Abnormal; Notable for the following components:      Result Value   Glucose, Bld 254 (*)    All other components within normal limits  CBC WITH  DIFFERENTIAL/PLATELET - Abnormal; Notable for the following components:   Hemoglobin 11.8 (*)    All other components within normal limits  URINALYSIS, ROUTINE W REFLEX MICROSCOPIC - Abnormal; Notable for the following components:   APPearance HAZY (*)    Glucose, UA 50 (*)    All other components within normal limits  TROPONIN I (HIGH SENSITIVITY)  TROPONIN I (HIGH SENSITIVITY)    EKG: EKG Interpretation Date/Time:  Tuesday October 06 2023 20:24:11 EDT Ventricular Rate:  80 PR Interval:  165 QRS Duration:  82 QT Interval:  393 QTC Calculation: 454 R Axis:   -88  Text Interpretation: Sinus rhythm Inferior infarct, old Age indeterminate anterior infarct Compared with prior EKG from 09/20/2023 Confirmed by Gennaro Bouchard ([id number]) on 10/06/2023 8:38:25 PM  Radiology: CT Head Wo Contrast Result Date: 10/06/2023 CLINICAL DATA:  ams EXAM: CT HEAD WITHOUT CONTRAST TECHNIQUE: Contiguous axial images were obtained from the base of the skull through the vertex without intravenous contrast. RADIATION DOSE REDUCTION: This exam was performed according to the departmental dose-optimization program which includes automated exposure control, adjustment of the mA and/or kV according to patient size and/or use of iterative reconstruction technique. COMPARISON:  CT head 09/21/2023 FINDINGS: Brain: Cerebral ventricle sizes are concordant with the degree of cerebral volume loss. Patchy and confluent areas of decreased attenuation are noted throughout the deep and periventricular white matter of the cerebral hemispheres bilaterally, compatible with chronic microvascular ischemic disease. No evidence of large-territorial acute infarction. No parenchymal hemorrhage. No mass lesion. No extra-axial collection. No mass effect or midline shift. No hydrocephalus. Basilar cisterns are patent. Vascular: No hyperdense vessel. Atherosclerotic calcifications are present within the cavernous internal carotid and vertebral  arteries. Skull: No acute fracture or focal lesion. Sinuses/Orbits: Trace right sphenoid sinus mucosal thickening. Otherwise paranasal sinuses and mastoid air cells are clear. The orbits are unremarkable. Other: None. IMPRESSION: No acute intracranial abnormality. Electronically Signed   By: Morgane  Naveau M.D.   On: 10/06/2023 20:21   DG Chest 1 View Result Date: 10/06/2023 CLINICAL DATA:  Shortness of breath EXAM: CHEST  1 VIEW COMPARISON:  09/20/2023 FINDINGS: Stable cardiomediastinal silhouette. Aortic atherosclerotic calcification. No focal consolidation, pleural effusion, or pneumothorax. No displaced rib fractures. IMPRESSION: No active disease. Electronically Signed   By: Norman Gatlin M.D.   On: 10/06/2023 19:49     Procedures   Medications Ordered in the ED  QUEtiapine  (SEROQUEL ) tablet 50 mg (has no administration in time range)  Medical Decision Making Social determinants of health: Dementia, lives at nursing home  Cardiac monitor interpretation: Sinus rhythm, no ectopy  Patient with negative workup and stable vitals.  I had a long discussion with the son at bedside it sounds like patient is having episodes of delirium and worsening dementia.  Will start her on Seroquel .  Will give her 1 dose here.  Advise close follow-up with primary care doctor at the facility and otherwise return to the ER for new or worsening symptoms.  Family at bedside feel comfortable plan for patient to be discharged back to the nursing home.  Problems Addressed: Delirium: undiagnosed new problem with uncertain prognosis History of dementia: chronic illness or injury Transient alteration of awareness: self-limited or minor problem  Amount and/or Complexity of Data Reviewed External Data Reviewed: notes.    Details: Prior hospital records reviewed and patient was recently discharged on August 9 after being admitted for UTI Labs: ordered. Decision-making details  documented in ED Course.    Details: Ordered and reviewed by me and unremarkable Radiology: ordered and independent interpretation performed. Decision-making details documented in ED Course.    Details: Ordered and interpreted by me independently of radiology Chest x-ray: Shows no acute abnormality in the chest CT head: Shows no acute intracranial process ECG/medicine tests: ordered and independent interpretation performed. Decision-making details documented in ED Course.    Details: Ordered and interpreted by me in the absence of cardiology and shows sinus rhythm, no acute change when compared to prior EKG, no STEMI  Risk OTC drugs. Prescription drug management. Diagnosis or treatment significantly limited by social determinants of health.     Final diagnoses:  Transient alteration of awareness  History of dementia  Delirium    ED Discharge Orders          Ordered    QUEtiapine  (SEROQUEL ) 50 MG tablet  Daily at bedtime        10/06/23 2314               Jarita Raval, Duwaine L, DO 10/06/23 2320

## 2023-10-06 NOTE — Discharge Instructions (Addendum)
 Stay on your medications as prescribed. Start your seroquel  at night time and follow up with the doctor at your facility in 1 week to increase your seroquel  if it is improving your delirium. Would recommend patient have dark, quiet room at night without TV on, and lights during the day time to help with delirium. Recommend ensuring patient receives 3 meals a day at normal meal times and has her glucose frequently checked if she is not eating. Return to the ER for any new or worsening symptoms.

## 2023-10-06 NOTE — ED Notes (Signed)
 CCMD called and placed on central monitoring

## 2023-10-06 NOTE — ED Triage Notes (Signed)
 Pt recently here for UTI, staff called out today for AMS. Hx of dementia. VSS.

## 2023-10-07 DIAGNOSIS — F0392 Unspecified dementia, unspecified severity, with psychotic disturbance: Secondary | ICD-10-CM | POA: Diagnosis not present

## 2023-10-07 DIAGNOSIS — R627 Adult failure to thrive: Secondary | ICD-10-CM | POA: Diagnosis not present

## 2023-10-07 DIAGNOSIS — E119 Type 2 diabetes mellitus without complications: Secondary | ICD-10-CM | POA: Diagnosis not present

## 2023-10-07 DIAGNOSIS — I480 Paroxysmal atrial fibrillation: Secondary | ICD-10-CM | POA: Diagnosis not present

## 2023-10-09 DIAGNOSIS — R627 Adult failure to thrive: Secondary | ICD-10-CM | POA: Diagnosis not present

## 2023-10-09 DIAGNOSIS — I480 Paroxysmal atrial fibrillation: Secondary | ICD-10-CM | POA: Diagnosis not present

## 2023-10-09 DIAGNOSIS — E119 Type 2 diabetes mellitus without complications: Secondary | ICD-10-CM | POA: Diagnosis not present

## 2023-10-09 DIAGNOSIS — F0392 Unspecified dementia, unspecified severity, with psychotic disturbance: Secondary | ICD-10-CM | POA: Diagnosis not present

## 2023-10-13 DIAGNOSIS — R627 Adult failure to thrive: Secondary | ICD-10-CM | POA: Diagnosis not present

## 2023-10-13 DIAGNOSIS — E119 Type 2 diabetes mellitus without complications: Secondary | ICD-10-CM | POA: Diagnosis not present

## 2023-10-13 DIAGNOSIS — I480 Paroxysmal atrial fibrillation: Secondary | ICD-10-CM | POA: Diagnosis not present

## 2023-10-13 DIAGNOSIS — F0392 Unspecified dementia, unspecified severity, with psychotic disturbance: Secondary | ICD-10-CM | POA: Diagnosis not present

## 2023-10-29 DIAGNOSIS — E119 Type 2 diabetes mellitus without complications: Secondary | ICD-10-CM | POA: Diagnosis not present

## 2023-10-29 DIAGNOSIS — I480 Paroxysmal atrial fibrillation: Secondary | ICD-10-CM | POA: Diagnosis not present

## 2023-10-29 DIAGNOSIS — I69354 Hemiplegia and hemiparesis following cerebral infarction affecting left non-dominant side: Secondary | ICD-10-CM | POA: Diagnosis not present

## 2023-10-29 DIAGNOSIS — Z7901 Long term (current) use of anticoagulants: Secondary | ICD-10-CM | POA: Diagnosis not present

## 2023-11-24 DIAGNOSIS — I69352 Hemiplegia and hemiparesis following cerebral infarction affecting left dominant side: Secondary | ICD-10-CM | POA: Diagnosis not present

## 2023-11-24 DIAGNOSIS — F0390 Unspecified dementia without behavioral disturbance: Secondary | ICD-10-CM | POA: Diagnosis not present

## 2023-11-24 DIAGNOSIS — M1990 Unspecified osteoarthritis, unspecified site: Secondary | ICD-10-CM | POA: Diagnosis not present

## 2023-11-24 DIAGNOSIS — R2689 Other abnormalities of gait and mobility: Secondary | ICD-10-CM | POA: Diagnosis not present

## 2023-11-25 DIAGNOSIS — I69352 Hemiplegia and hemiparesis following cerebral infarction affecting left dominant side: Secondary | ICD-10-CM | POA: Diagnosis not present

## 2023-11-25 DIAGNOSIS — F0390 Unspecified dementia without behavioral disturbance: Secondary | ICD-10-CM | POA: Diagnosis not present

## 2023-11-25 DIAGNOSIS — M1990 Unspecified osteoarthritis, unspecified site: Secondary | ICD-10-CM | POA: Diagnosis not present

## 2023-11-25 DIAGNOSIS — R2689 Other abnormalities of gait and mobility: Secondary | ICD-10-CM | POA: Diagnosis not present

## 2023-12-01 DIAGNOSIS — I69352 Hemiplegia and hemiparesis following cerebral infarction affecting left dominant side: Secondary | ICD-10-CM | POA: Diagnosis not present

## 2023-12-01 DIAGNOSIS — R2689 Other abnormalities of gait and mobility: Secondary | ICD-10-CM | POA: Diagnosis not present

## 2023-12-01 DIAGNOSIS — M1990 Unspecified osteoarthritis, unspecified site: Secondary | ICD-10-CM | POA: Diagnosis not present

## 2023-12-01 DIAGNOSIS — F0390 Unspecified dementia without behavioral disturbance: Secondary | ICD-10-CM | POA: Diagnosis not present

## 2023-12-03 DIAGNOSIS — R2689 Other abnormalities of gait and mobility: Secondary | ICD-10-CM | POA: Diagnosis not present

## 2023-12-03 DIAGNOSIS — F0390 Unspecified dementia without behavioral disturbance: Secondary | ICD-10-CM | POA: Diagnosis not present

## 2023-12-03 DIAGNOSIS — M1990 Unspecified osteoarthritis, unspecified site: Secondary | ICD-10-CM | POA: Diagnosis not present

## 2023-12-03 DIAGNOSIS — I69352 Hemiplegia and hemiparesis following cerebral infarction affecting left dominant side: Secondary | ICD-10-CM | POA: Diagnosis not present

## 2023-12-07 DIAGNOSIS — E119 Type 2 diabetes mellitus without complications: Secondary | ICD-10-CM | POA: Diagnosis not present

## 2023-12-07 DIAGNOSIS — I480 Paroxysmal atrial fibrillation: Secondary | ICD-10-CM | POA: Diagnosis not present

## 2023-12-07 DIAGNOSIS — R627 Adult failure to thrive: Secondary | ICD-10-CM | POA: Diagnosis not present

## 2023-12-07 DIAGNOSIS — I10 Essential (primary) hypertension: Secondary | ICD-10-CM | POA: Diagnosis not present

## 2023-12-07 DIAGNOSIS — R2689 Other abnormalities of gait and mobility: Secondary | ICD-10-CM | POA: Diagnosis not present

## 2023-12-07 DIAGNOSIS — I69352 Hemiplegia and hemiparesis following cerebral infarction affecting left dominant side: Secondary | ICD-10-CM | POA: Diagnosis not present

## 2023-12-07 DIAGNOSIS — F0390 Unspecified dementia without behavioral disturbance: Secondary | ICD-10-CM | POA: Diagnosis not present

## 2023-12-07 DIAGNOSIS — M1990 Unspecified osteoarthritis, unspecified site: Secondary | ICD-10-CM | POA: Diagnosis not present

## 2023-12-09 DIAGNOSIS — F0390 Unspecified dementia without behavioral disturbance: Secondary | ICD-10-CM | POA: Diagnosis not present

## 2023-12-09 DIAGNOSIS — M6281 Muscle weakness (generalized): Secondary | ICD-10-CM | POA: Diagnosis not present

## 2023-12-09 DIAGNOSIS — M1990 Unspecified osteoarthritis, unspecified site: Secondary | ICD-10-CM | POA: Diagnosis not present

## 2023-12-09 DIAGNOSIS — R2689 Other abnormalities of gait and mobility: Secondary | ICD-10-CM | POA: Diagnosis not present

## 2023-12-09 DIAGNOSIS — I69352 Hemiplegia and hemiparesis following cerebral infarction affecting left dominant side: Secondary | ICD-10-CM | POA: Diagnosis not present

## 2023-12-10 DIAGNOSIS — I69352 Hemiplegia and hemiparesis following cerebral infarction affecting left dominant side: Secondary | ICD-10-CM | POA: Diagnosis not present

## 2023-12-10 DIAGNOSIS — M1990 Unspecified osteoarthritis, unspecified site: Secondary | ICD-10-CM | POA: Diagnosis not present

## 2023-12-10 DIAGNOSIS — F0390 Unspecified dementia without behavioral disturbance: Secondary | ICD-10-CM | POA: Diagnosis not present

## 2023-12-10 DIAGNOSIS — R2689 Other abnormalities of gait and mobility: Secondary | ICD-10-CM | POA: Diagnosis not present

## 2023-12-14 DIAGNOSIS — I69352 Hemiplegia and hemiparesis following cerebral infarction affecting left dominant side: Secondary | ICD-10-CM | POA: Diagnosis not present

## 2023-12-14 DIAGNOSIS — F0390 Unspecified dementia without behavioral disturbance: Secondary | ICD-10-CM | POA: Diagnosis not present

## 2023-12-14 DIAGNOSIS — R2689 Other abnormalities of gait and mobility: Secondary | ICD-10-CM | POA: Diagnosis not present

## 2023-12-14 DIAGNOSIS — M6281 Muscle weakness (generalized): Secondary | ICD-10-CM | POA: Diagnosis not present

## 2023-12-14 DIAGNOSIS — M1990 Unspecified osteoarthritis, unspecified site: Secondary | ICD-10-CM | POA: Diagnosis not present

## 2023-12-15 DIAGNOSIS — N390 Urinary tract infection, site not specified: Secondary | ICD-10-CM | POA: Diagnosis not present

## 2023-12-15 DIAGNOSIS — Z87440 Personal history of urinary (tract) infections: Secondary | ICD-10-CM | POA: Diagnosis not present

## 2023-12-22 DIAGNOSIS — F329 Major depressive disorder, single episode, unspecified: Secondary | ICD-10-CM | POA: Diagnosis not present

## 2023-12-24 DIAGNOSIS — I69352 Hemiplegia and hemiparesis following cerebral infarction affecting left dominant side: Secondary | ICD-10-CM | POA: Diagnosis not present

## 2023-12-24 DIAGNOSIS — R41841 Cognitive communication deficit: Secondary | ICD-10-CM | POA: Diagnosis not present

## 2023-12-24 DIAGNOSIS — F0390 Unspecified dementia without behavioral disturbance: Secondary | ICD-10-CM | POA: Diagnosis not present

## 2023-12-24 DIAGNOSIS — M6281 Muscle weakness (generalized): Secondary | ICD-10-CM | POA: Diagnosis not present

## 2023-12-24 DIAGNOSIS — M797 Fibromyalgia: Secondary | ICD-10-CM | POA: Diagnosis not present

## 2023-12-24 DIAGNOSIS — R1311 Dysphagia, oral phase: Secondary | ICD-10-CM | POA: Diagnosis not present

## 2023-12-25 DIAGNOSIS — R1311 Dysphagia, oral phase: Secondary | ICD-10-CM | POA: Diagnosis not present

## 2023-12-25 DIAGNOSIS — R41841 Cognitive communication deficit: Secondary | ICD-10-CM | POA: Diagnosis not present

## 2023-12-25 DIAGNOSIS — M797 Fibromyalgia: Secondary | ICD-10-CM | POA: Diagnosis not present

## 2023-12-25 DIAGNOSIS — M6281 Muscle weakness (generalized): Secondary | ICD-10-CM | POA: Diagnosis not present

## 2023-12-25 DIAGNOSIS — F0390 Unspecified dementia without behavioral disturbance: Secondary | ICD-10-CM | POA: Diagnosis not present

## 2023-12-25 DIAGNOSIS — I69352 Hemiplegia and hemiparesis following cerebral infarction affecting left dominant side: Secondary | ICD-10-CM | POA: Diagnosis not present

## 2023-12-28 DIAGNOSIS — R41841 Cognitive communication deficit: Secondary | ICD-10-CM | POA: Diagnosis not present

## 2023-12-28 DIAGNOSIS — M797 Fibromyalgia: Secondary | ICD-10-CM | POA: Diagnosis not present

## 2023-12-28 DIAGNOSIS — R1311 Dysphagia, oral phase: Secondary | ICD-10-CM | POA: Diagnosis not present

## 2023-12-28 DIAGNOSIS — F0390 Unspecified dementia without behavioral disturbance: Secondary | ICD-10-CM | POA: Diagnosis not present

## 2023-12-28 DIAGNOSIS — I69352 Hemiplegia and hemiparesis following cerebral infarction affecting left dominant side: Secondary | ICD-10-CM | POA: Diagnosis not present

## 2023-12-28 DIAGNOSIS — M6281 Muscle weakness (generalized): Secondary | ICD-10-CM | POA: Diagnosis not present

## 2023-12-29 DIAGNOSIS — I69352 Hemiplegia and hemiparesis following cerebral infarction affecting left dominant side: Secondary | ICD-10-CM | POA: Diagnosis not present

## 2023-12-29 DIAGNOSIS — I480 Paroxysmal atrial fibrillation: Secondary | ICD-10-CM | POA: Diagnosis not present

## 2023-12-29 DIAGNOSIS — M6281 Muscle weakness (generalized): Secondary | ICD-10-CM | POA: Diagnosis not present

## 2023-12-29 DIAGNOSIS — R627 Adult failure to thrive: Secondary | ICD-10-CM | POA: Diagnosis not present

## 2023-12-29 DIAGNOSIS — I10 Essential (primary) hypertension: Secondary | ICD-10-CM | POA: Diagnosis not present

## 2023-12-29 DIAGNOSIS — F0390 Unspecified dementia without behavioral disturbance: Secondary | ICD-10-CM | POA: Diagnosis not present

## 2023-12-29 DIAGNOSIS — E039 Hypothyroidism, unspecified: Secondary | ICD-10-CM | POA: Diagnosis not present

## 2023-12-29 DIAGNOSIS — R41841 Cognitive communication deficit: Secondary | ICD-10-CM | POA: Diagnosis not present

## 2023-12-29 DIAGNOSIS — M797 Fibromyalgia: Secondary | ICD-10-CM | POA: Diagnosis not present

## 2023-12-29 DIAGNOSIS — R1311 Dysphagia, oral phase: Secondary | ICD-10-CM | POA: Diagnosis not present

## 2023-12-30 DIAGNOSIS — R1311 Dysphagia, oral phase: Secondary | ICD-10-CM | POA: Diagnosis not present

## 2023-12-30 DIAGNOSIS — F0390 Unspecified dementia without behavioral disturbance: Secondary | ICD-10-CM | POA: Diagnosis not present

## 2023-12-30 DIAGNOSIS — R41841 Cognitive communication deficit: Secondary | ICD-10-CM | POA: Diagnosis not present

## 2023-12-30 DIAGNOSIS — I69352 Hemiplegia and hemiparesis following cerebral infarction affecting left dominant side: Secondary | ICD-10-CM | POA: Diagnosis not present

## 2023-12-30 DIAGNOSIS — M6281 Muscle weakness (generalized): Secondary | ICD-10-CM | POA: Diagnosis not present

## 2023-12-30 DIAGNOSIS — M797 Fibromyalgia: Secondary | ICD-10-CM | POA: Diagnosis not present

## 2023-12-31 DIAGNOSIS — M797 Fibromyalgia: Secondary | ICD-10-CM | POA: Diagnosis not present

## 2023-12-31 DIAGNOSIS — R41841 Cognitive communication deficit: Secondary | ICD-10-CM | POA: Diagnosis not present

## 2023-12-31 DIAGNOSIS — F0390 Unspecified dementia without behavioral disturbance: Secondary | ICD-10-CM | POA: Diagnosis not present

## 2023-12-31 DIAGNOSIS — I69352 Hemiplegia and hemiparesis following cerebral infarction affecting left dominant side: Secondary | ICD-10-CM | POA: Diagnosis not present

## 2023-12-31 DIAGNOSIS — R1311 Dysphagia, oral phase: Secondary | ICD-10-CM | POA: Diagnosis not present

## 2023-12-31 DIAGNOSIS — M6281 Muscle weakness (generalized): Secondary | ICD-10-CM | POA: Diagnosis not present

## 2024-01-01 DIAGNOSIS — F0390 Unspecified dementia without behavioral disturbance: Secondary | ICD-10-CM | POA: Diagnosis not present

## 2024-01-01 DIAGNOSIS — M6281 Muscle weakness (generalized): Secondary | ICD-10-CM | POA: Diagnosis not present

## 2024-01-01 DIAGNOSIS — I69352 Hemiplegia and hemiparesis following cerebral infarction affecting left dominant side: Secondary | ICD-10-CM | POA: Diagnosis not present

## 2024-01-01 DIAGNOSIS — R41841 Cognitive communication deficit: Secondary | ICD-10-CM | POA: Diagnosis not present

## 2024-01-01 DIAGNOSIS — M797 Fibromyalgia: Secondary | ICD-10-CM | POA: Diagnosis not present

## 2024-01-01 DIAGNOSIS — R1311 Dysphagia, oral phase: Secondary | ICD-10-CM | POA: Diagnosis not present

## 2024-01-04 DIAGNOSIS — R41841 Cognitive communication deficit: Secondary | ICD-10-CM | POA: Diagnosis not present

## 2024-01-04 DIAGNOSIS — R1311 Dysphagia, oral phase: Secondary | ICD-10-CM | POA: Diagnosis not present

## 2024-01-04 DIAGNOSIS — M797 Fibromyalgia: Secondary | ICD-10-CM | POA: Diagnosis not present

## 2024-01-04 DIAGNOSIS — F0390 Unspecified dementia without behavioral disturbance: Secondary | ICD-10-CM | POA: Diagnosis not present

## 2024-01-04 DIAGNOSIS — M6281 Muscle weakness (generalized): Secondary | ICD-10-CM | POA: Diagnosis not present

## 2024-01-04 DIAGNOSIS — I69352 Hemiplegia and hemiparesis following cerebral infarction affecting left dominant side: Secondary | ICD-10-CM | POA: Diagnosis not present

## 2024-01-05 DIAGNOSIS — M6281 Muscle weakness (generalized): Secondary | ICD-10-CM | POA: Diagnosis not present

## 2024-01-05 DIAGNOSIS — F0390 Unspecified dementia without behavioral disturbance: Secondary | ICD-10-CM | POA: Diagnosis not present

## 2024-01-05 DIAGNOSIS — I69352 Hemiplegia and hemiparesis following cerebral infarction affecting left dominant side: Secondary | ICD-10-CM | POA: Diagnosis not present

## 2024-01-05 DIAGNOSIS — R41841 Cognitive communication deficit: Secondary | ICD-10-CM | POA: Diagnosis not present

## 2024-01-05 DIAGNOSIS — M797 Fibromyalgia: Secondary | ICD-10-CM | POA: Diagnosis not present

## 2024-01-05 DIAGNOSIS — R1311 Dysphagia, oral phase: Secondary | ICD-10-CM | POA: Diagnosis not present

## 2024-01-06 DIAGNOSIS — F0390 Unspecified dementia without behavioral disturbance: Secondary | ICD-10-CM | POA: Diagnosis not present

## 2024-01-06 DIAGNOSIS — M797 Fibromyalgia: Secondary | ICD-10-CM | POA: Diagnosis not present

## 2024-01-06 DIAGNOSIS — R1311 Dysphagia, oral phase: Secondary | ICD-10-CM | POA: Diagnosis not present

## 2024-01-06 DIAGNOSIS — I69352 Hemiplegia and hemiparesis following cerebral infarction affecting left dominant side: Secondary | ICD-10-CM | POA: Diagnosis not present

## 2024-01-06 DIAGNOSIS — R41841 Cognitive communication deficit: Secondary | ICD-10-CM | POA: Diagnosis not present

## 2024-01-06 DIAGNOSIS — M6281 Muscle weakness (generalized): Secondary | ICD-10-CM | POA: Diagnosis not present

## 2024-01-07 DIAGNOSIS — M6281 Muscle weakness (generalized): Secondary | ICD-10-CM | POA: Diagnosis not present

## 2024-01-07 DIAGNOSIS — M797 Fibromyalgia: Secondary | ICD-10-CM | POA: Diagnosis not present

## 2024-01-07 DIAGNOSIS — R1311 Dysphagia, oral phase: Secondary | ICD-10-CM | POA: Diagnosis not present

## 2024-01-07 DIAGNOSIS — R41841 Cognitive communication deficit: Secondary | ICD-10-CM | POA: Diagnosis not present

## 2024-01-07 DIAGNOSIS — I69352 Hemiplegia and hemiparesis following cerebral infarction affecting left dominant side: Secondary | ICD-10-CM | POA: Diagnosis not present

## 2024-01-07 DIAGNOSIS — F0390 Unspecified dementia without behavioral disturbance: Secondary | ICD-10-CM | POA: Diagnosis not present

## 2024-01-08 DIAGNOSIS — R41841 Cognitive communication deficit: Secondary | ICD-10-CM | POA: Diagnosis not present

## 2024-01-08 DIAGNOSIS — M6281 Muscle weakness (generalized): Secondary | ICD-10-CM | POA: Diagnosis not present

## 2024-01-08 DIAGNOSIS — I69352 Hemiplegia and hemiparesis following cerebral infarction affecting left dominant side: Secondary | ICD-10-CM | POA: Diagnosis not present

## 2024-01-08 DIAGNOSIS — R1311 Dysphagia, oral phase: Secondary | ICD-10-CM | POA: Diagnosis not present

## 2024-01-08 DIAGNOSIS — F0390 Unspecified dementia without behavioral disturbance: Secondary | ICD-10-CM | POA: Diagnosis not present

## 2024-01-08 DIAGNOSIS — M797 Fibromyalgia: Secondary | ICD-10-CM | POA: Diagnosis not present

## 2024-01-09 DIAGNOSIS — R1311 Dysphagia, oral phase: Secondary | ICD-10-CM | POA: Diagnosis not present

## 2024-01-09 DIAGNOSIS — M6281 Muscle weakness (generalized): Secondary | ICD-10-CM | POA: Diagnosis not present

## 2024-01-09 DIAGNOSIS — M797 Fibromyalgia: Secondary | ICD-10-CM | POA: Diagnosis not present

## 2024-01-09 DIAGNOSIS — F0390 Unspecified dementia without behavioral disturbance: Secondary | ICD-10-CM | POA: Diagnosis not present

## 2024-01-09 DIAGNOSIS — I69352 Hemiplegia and hemiparesis following cerebral infarction affecting left dominant side: Secondary | ICD-10-CM | POA: Diagnosis not present

## 2024-01-09 DIAGNOSIS — R41841 Cognitive communication deficit: Secondary | ICD-10-CM | POA: Diagnosis not present

## 2024-01-10 DIAGNOSIS — F0390 Unspecified dementia without behavioral disturbance: Secondary | ICD-10-CM | POA: Diagnosis not present

## 2024-01-10 DIAGNOSIS — R41841 Cognitive communication deficit: Secondary | ICD-10-CM | POA: Diagnosis not present

## 2024-01-10 DIAGNOSIS — M6281 Muscle weakness (generalized): Secondary | ICD-10-CM | POA: Diagnosis not present

## 2024-01-10 DIAGNOSIS — I69352 Hemiplegia and hemiparesis following cerebral infarction affecting left dominant side: Secondary | ICD-10-CM | POA: Diagnosis not present

## 2024-01-10 DIAGNOSIS — M797 Fibromyalgia: Secondary | ICD-10-CM | POA: Diagnosis not present

## 2024-01-10 DIAGNOSIS — R1311 Dysphagia, oral phase: Secondary | ICD-10-CM | POA: Diagnosis not present

## 2024-01-11 DIAGNOSIS — R1311 Dysphagia, oral phase: Secondary | ICD-10-CM | POA: Diagnosis not present

## 2024-01-11 DIAGNOSIS — M6281 Muscle weakness (generalized): Secondary | ICD-10-CM | POA: Diagnosis not present

## 2024-01-11 DIAGNOSIS — I69352 Hemiplegia and hemiparesis following cerebral infarction affecting left dominant side: Secondary | ICD-10-CM | POA: Diagnosis not present

## 2024-01-11 DIAGNOSIS — M797 Fibromyalgia: Secondary | ICD-10-CM | POA: Diagnosis not present

## 2024-01-11 DIAGNOSIS — R41841 Cognitive communication deficit: Secondary | ICD-10-CM | POA: Diagnosis not present

## 2024-01-11 DIAGNOSIS — F0390 Unspecified dementia without behavioral disturbance: Secondary | ICD-10-CM | POA: Diagnosis not present

## 2024-01-14 DIAGNOSIS — R1311 Dysphagia, oral phase: Secondary | ICD-10-CM | POA: Diagnosis not present

## 2024-01-14 DIAGNOSIS — M6281 Muscle weakness (generalized): Secondary | ICD-10-CM | POA: Diagnosis not present

## 2024-01-14 DIAGNOSIS — F0390 Unspecified dementia without behavioral disturbance: Secondary | ICD-10-CM | POA: Diagnosis not present

## 2024-01-14 DIAGNOSIS — R41841 Cognitive communication deficit: Secondary | ICD-10-CM | POA: Diagnosis not present

## 2024-01-14 DIAGNOSIS — I69352 Hemiplegia and hemiparesis following cerebral infarction affecting left dominant side: Secondary | ICD-10-CM | POA: Diagnosis not present

## 2024-01-14 DIAGNOSIS — M797 Fibromyalgia: Secondary | ICD-10-CM | POA: Diagnosis not present

## 2024-01-16 DIAGNOSIS — F0390 Unspecified dementia without behavioral disturbance: Secondary | ICD-10-CM | POA: Diagnosis not present

## 2024-01-16 DIAGNOSIS — I69352 Hemiplegia and hemiparesis following cerebral infarction affecting left dominant side: Secondary | ICD-10-CM | POA: Diagnosis not present

## 2024-01-16 DIAGNOSIS — R1311 Dysphagia, oral phase: Secondary | ICD-10-CM | POA: Diagnosis not present

## 2024-01-16 DIAGNOSIS — R41841 Cognitive communication deficit: Secondary | ICD-10-CM | POA: Diagnosis not present

## 2024-01-16 DIAGNOSIS — M797 Fibromyalgia: Secondary | ICD-10-CM | POA: Diagnosis not present

## 2024-01-16 DIAGNOSIS — M6281 Muscle weakness (generalized): Secondary | ICD-10-CM | POA: Diagnosis not present

## 2024-01-17 DIAGNOSIS — I69352 Hemiplegia and hemiparesis following cerebral infarction affecting left dominant side: Secondary | ICD-10-CM | POA: Diagnosis not present

## 2024-01-17 DIAGNOSIS — R1311 Dysphagia, oral phase: Secondary | ICD-10-CM | POA: Diagnosis not present

## 2024-01-17 DIAGNOSIS — M797 Fibromyalgia: Secondary | ICD-10-CM | POA: Diagnosis not present

## 2024-01-17 DIAGNOSIS — M6281 Muscle weakness (generalized): Secondary | ICD-10-CM | POA: Diagnosis not present

## 2024-01-17 DIAGNOSIS — R41841 Cognitive communication deficit: Secondary | ICD-10-CM | POA: Diagnosis not present

## 2024-01-17 DIAGNOSIS — F0390 Unspecified dementia without behavioral disturbance: Secondary | ICD-10-CM | POA: Diagnosis not present

## 2024-01-26 DIAGNOSIS — I10 Essential (primary) hypertension: Secondary | ICD-10-CM | POA: Diagnosis not present

## 2024-01-26 DIAGNOSIS — R627 Adult failure to thrive: Secondary | ICD-10-CM | POA: Diagnosis not present

## 2024-01-26 DIAGNOSIS — I480 Paroxysmal atrial fibrillation: Secondary | ICD-10-CM | POA: Diagnosis not present

## 2024-01-26 DIAGNOSIS — E039 Hypothyroidism, unspecified: Secondary | ICD-10-CM | POA: Diagnosis not present

## 2024-01-28 DIAGNOSIS — I10 Essential (primary) hypertension: Secondary | ICD-10-CM | POA: Diagnosis not present

## 2024-01-28 DIAGNOSIS — I480 Paroxysmal atrial fibrillation: Secondary | ICD-10-CM | POA: Diagnosis not present

## 2024-01-28 DIAGNOSIS — E119 Type 2 diabetes mellitus without complications: Secondary | ICD-10-CM | POA: Diagnosis not present

## 2024-01-28 DIAGNOSIS — R627 Adult failure to thrive: Secondary | ICD-10-CM | POA: Diagnosis not present
